# Patient Record
Sex: Female | Born: 1945 | Race: White | Hispanic: No | Marital: Married | Smoking: Current every day smoker
Health system: Southern US, Community
[De-identification: ages and names within clinical notes are randomized; demographics above are authoritative.]

## PROBLEM LIST (undated history)

## (undated) DIAGNOSIS — L89609 Pressure ulcer of unspecified heel, unspecified stage: Secondary | ICD-10-CM

## (undated) DIAGNOSIS — E039 Hypothyroidism, unspecified: Secondary | ICD-10-CM

## (undated) DIAGNOSIS — K219 Gastro-esophageal reflux disease without esophagitis: Secondary | ICD-10-CM

## (undated) DIAGNOSIS — I10 Essential (primary) hypertension: Secondary | ICD-10-CM

## (undated) DIAGNOSIS — G629 Polyneuropathy, unspecified: Secondary | ICD-10-CM

## (undated) DIAGNOSIS — C50919 Malignant neoplasm of unspecified site of unspecified female breast: Secondary | ICD-10-CM

## (undated) DIAGNOSIS — E119 Type 2 diabetes mellitus without complications: Secondary | ICD-10-CM

## (undated) DIAGNOSIS — L89009 Pressure ulcer of unspecified elbow, unspecified stage: Secondary | ICD-10-CM

## (undated) HISTORY — PX: BREAST LUMPECTOMY: SHX2

## (undated) HISTORY — PX: CHOLECYSTECTOMY: SHX55

---

## 2006-02-09 ENCOUNTER — Ambulatory Visit: Payer: Self-pay | Admitting: Unknown Physician Specialty

## 2007-05-03 ENCOUNTER — Ambulatory Visit: Payer: Self-pay | Admitting: Unknown Physician Specialty

## 2007-11-15 ENCOUNTER — Other Ambulatory Visit: Payer: Self-pay

## 2007-11-15 ENCOUNTER — Inpatient Hospital Stay: Payer: Self-pay | Admitting: Internal Medicine

## 2008-05-07 ENCOUNTER — Emergency Department: Payer: Self-pay | Admitting: Emergency Medicine

## 2008-05-22 ENCOUNTER — Emergency Department: Payer: Self-pay | Admitting: Emergency Medicine

## 2008-08-05 ENCOUNTER — Ambulatory Visit: Payer: Self-pay | Admitting: Unknown Physician Specialty

## 2009-06-26 ENCOUNTER — Ambulatory Visit: Payer: Self-pay | Admitting: Unknown Physician Specialty

## 2009-07-24 ENCOUNTER — Ambulatory Visit: Payer: Self-pay | Admitting: Surgery

## 2009-07-25 ENCOUNTER — Ambulatory Visit: Payer: Self-pay | Admitting: Surgery

## 2009-08-13 ENCOUNTER — Ambulatory Visit: Payer: Self-pay | Admitting: Internal Medicine

## 2009-09-08 ENCOUNTER — Ambulatory Visit: Payer: Self-pay | Admitting: Surgery

## 2009-09-13 ENCOUNTER — Ambulatory Visit: Payer: Self-pay | Admitting: Internal Medicine

## 2009-10-14 ENCOUNTER — Ambulatory Visit: Payer: Self-pay | Admitting: Internal Medicine

## 2009-11-11 ENCOUNTER — Ambulatory Visit: Payer: Self-pay | Admitting: Internal Medicine

## 2009-12-12 ENCOUNTER — Ambulatory Visit: Payer: Self-pay | Admitting: Internal Medicine

## 2010-01-11 ENCOUNTER — Ambulatory Visit: Payer: Self-pay | Admitting: Internal Medicine

## 2010-02-11 ENCOUNTER — Ambulatory Visit: Payer: Self-pay | Admitting: Internal Medicine

## 2010-03-13 ENCOUNTER — Ambulatory Visit: Payer: Self-pay | Admitting: Internal Medicine

## 2010-04-02 ENCOUNTER — Ambulatory Visit: Payer: Self-pay | Admitting: Surgery

## 2010-04-13 ENCOUNTER — Ambulatory Visit: Payer: Self-pay | Admitting: Internal Medicine

## 2010-07-14 ENCOUNTER — Ambulatory Visit: Payer: Self-pay | Admitting: Internal Medicine

## 2010-07-15 LAB — CANCER ANTIGEN 27.29: CA 27.29: 23.7 U/mL (ref 0.0–38.6)

## 2010-08-13 ENCOUNTER — Ambulatory Visit: Payer: Self-pay | Admitting: Internal Medicine

## 2010-09-08 ENCOUNTER — Ambulatory Visit: Payer: Self-pay | Admitting: Surgery

## 2010-11-12 ENCOUNTER — Ambulatory Visit: Payer: Self-pay | Admitting: Internal Medicine

## 2010-12-09 ENCOUNTER — Ambulatory Visit: Payer: Self-pay | Admitting: Unknown Physician Specialty

## 2010-12-13 ENCOUNTER — Ambulatory Visit: Payer: Self-pay | Admitting: Internal Medicine

## 2010-12-13 ENCOUNTER — Ambulatory Visit: Payer: Self-pay | Admitting: Unknown Physician Specialty

## 2011-01-12 ENCOUNTER — Ambulatory Visit: Payer: Self-pay | Admitting: Unknown Physician Specialty

## 2011-02-12 ENCOUNTER — Ambulatory Visit: Payer: Self-pay | Admitting: Unknown Physician Specialty

## 2011-03-22 ENCOUNTER — Ambulatory Visit: Payer: Self-pay | Admitting: Unknown Physician Specialty

## 2011-06-15 ENCOUNTER — Ambulatory Visit: Payer: Self-pay | Admitting: Internal Medicine

## 2011-07-15 ENCOUNTER — Ambulatory Visit: Payer: Self-pay | Admitting: Internal Medicine

## 2011-10-04 ENCOUNTER — Ambulatory Visit: Payer: Self-pay | Admitting: Surgery

## 2011-12-16 ENCOUNTER — Ambulatory Visit: Payer: Self-pay | Admitting: Oncology

## 2011-12-16 LAB — CBC CANCER CENTER
Basophil #: 0 x10 3/mm (ref 0.0–0.1)
Basophil %: 0.2 %
Eosinophil %: 1.4 %
HCT: 38.6 % (ref 35.0–47.0)
MCHC: 34.3 g/dL (ref 32.0–36.0)
MCV: 102 fL — ABNORMAL HIGH (ref 80–100)
Monocyte #: 0.6 x10 3/mm (ref 0.0–0.7)
Platelet: 232 x10 3/mm (ref 150–440)
RDW: 14 % (ref 11.5–14.5)
WBC: 8.3 x10 3/mm (ref 3.6–11.0)

## 2011-12-16 LAB — COMPREHENSIVE METABOLIC PANEL
Albumin: 3.9 g/dL (ref 3.4–5.0)
Alkaline Phosphatase: 102 U/L (ref 50–136)
BUN: 9 mg/dL (ref 7–18)
Calcium, Total: 9.2 mg/dL (ref 8.5–10.1)
Chloride: 102 mmol/L (ref 98–107)
Co2: 26 mmol/L (ref 21–32)
EGFR (African American): >60
EGFR (Non-African Amer.): >60
Glucose: 125 mg/dL — ABNORMAL HIGH (ref 65–99)
Osmolality: 278 (ref 275–301)
Potassium: 3.8 mmol/L (ref 3.5–5.1)
SGPT (ALT): 32 U/L
Sodium: 139 mmol/L (ref 136–145)

## 2012-01-12 ENCOUNTER — Ambulatory Visit: Payer: Self-pay | Admitting: Oncology

## 2012-06-15 ENCOUNTER — Ambulatory Visit: Payer: Self-pay | Admitting: Oncology

## 2012-06-16 LAB — CANCER ANTIGEN 27.29: CA 27.29: 18.3 U/mL (ref 0.0–38.6)

## 2012-07-14 ENCOUNTER — Ambulatory Visit: Payer: Self-pay | Admitting: Oncology

## 2012-11-09 ENCOUNTER — Ambulatory Visit: Payer: Self-pay | Admitting: Surgery

## 2012-12-14 ENCOUNTER — Ambulatory Visit: Payer: Self-pay | Admitting: Oncology

## 2012-12-15 LAB — CANCER ANTIGEN 27.29: CA 27.29: 21 U/mL (ref 0.0–38.6)

## 2013-01-11 ENCOUNTER — Ambulatory Visit: Payer: Self-pay | Admitting: Oncology

## 2013-05-09 ENCOUNTER — Ambulatory Visit: Payer: Self-pay | Admitting: Oncology

## 2013-05-24 ENCOUNTER — Ambulatory Visit: Payer: Self-pay | Admitting: Oncology

## 2013-05-25 LAB — CANCER ANTIGEN 27.29: CA 27.29: 28.8 U/mL (ref 0.0–38.6)

## 2013-06-13 ENCOUNTER — Ambulatory Visit: Payer: Self-pay | Admitting: Oncology

## 2013-11-19 ENCOUNTER — Ambulatory Visit: Payer: Self-pay | Admitting: Surgery

## 2013-11-21 ENCOUNTER — Ambulatory Visit: Payer: Self-pay | Admitting: Oncology

## 2013-11-23 LAB — CANCER ANTIGEN 27.29: CA 27.29: 21.6 U/mL (ref 0.0–38.6)

## 2013-12-12 ENCOUNTER — Ambulatory Visit: Payer: Self-pay | Admitting: Oncology

## 2014-11-27 ENCOUNTER — Ambulatory Visit: Payer: Self-pay | Admitting: Ophthalmology

## 2014-12-05 ENCOUNTER — Ambulatory Visit: Payer: Self-pay | Admitting: Unknown Physician Specialty

## 2014-12-20 ENCOUNTER — Ambulatory Visit
Admit: 2014-12-20 | Disposition: A | Discharge status: Home / Self Care | Payer: Self-pay | Attending: Surgery | Admitting: Surgery

## 2014-12-25 ENCOUNTER — Ambulatory Visit
Admit: 2014-12-25 | Disposition: A | Discharge status: Home / Self Care | Payer: Self-pay | Attending: Oncology | Admitting: Oncology

## 2015-01-15 ENCOUNTER — Other Ambulatory Visit: Payer: Self-pay | Admitting: Oncology

## 2015-01-15 DIAGNOSIS — C50911 Malignant neoplasm of unspecified site of right female breast: Secondary | ICD-10-CM

## 2015-01-21 ENCOUNTER — Emergency Department
Admission: EM | Admit: 2015-01-21 | Discharge: 2015-01-21 | Disposition: A | Discharge status: Home / Self Care | Payer: Medicare Other | Attending: Internal Medicine | Admitting: Internal Medicine

## 2015-01-21 ENCOUNTER — Other Ambulatory Visit: Payer: Self-pay

## 2015-01-21 ENCOUNTER — Emergency Department: Payer: Medicare Other

## 2015-01-21 ENCOUNTER — Encounter: Payer: Self-pay | Admitting: Medical Oncology

## 2015-01-21 DIAGNOSIS — W1809XA Striking against other object with subsequent fall, initial encounter: Secondary | ICD-10-CM

## 2015-01-21 DIAGNOSIS — Y998 Other external cause status: Secondary | ICD-10-CM | POA: Insufficient documentation

## 2015-01-21 DIAGNOSIS — E119 Type 2 diabetes mellitus without complications: Secondary | ICD-10-CM | POA: Insufficient documentation

## 2015-01-21 DIAGNOSIS — Y9389 Activity, other specified: Secondary | ICD-10-CM | POA: Insufficient documentation

## 2015-01-21 DIAGNOSIS — Y9289 Other specified places as the place of occurrence of the external cause: Secondary | ICD-10-CM | POA: Insufficient documentation

## 2015-01-21 DIAGNOSIS — S42201A Unspecified fracture of upper end of right humerus, initial encounter for closed fracture: Secondary | ICD-10-CM | POA: Insufficient documentation

## 2015-01-21 DIAGNOSIS — W01198A Fall on same level from slipping, tripping and stumbling with subsequent striking against other object, initial encounter: Secondary | ICD-10-CM | POA: Insufficient documentation

## 2015-01-21 DIAGNOSIS — I10 Essential (primary) hypertension: Secondary | ICD-10-CM | POA: Diagnosis not present

## 2015-01-21 DIAGNOSIS — S4991XA Unspecified injury of right shoulder and upper arm, initial encounter: Secondary | ICD-10-CM | POA: Diagnosis present

## 2015-01-21 DIAGNOSIS — Z720 Tobacco use: Secondary | ICD-10-CM | POA: Diagnosis not present

## 2015-01-21 DIAGNOSIS — S4291XA Fracture of right shoulder girdle, part unspecified, initial encounter for closed fracture: Secondary | ICD-10-CM

## 2015-01-21 HISTORY — DX: Type 2 diabetes mellitus without complications: E11.9

## 2015-01-21 HISTORY — DX: Malignant neoplasm of unspecified site of unspecified female breast: C50.919

## 2015-01-21 HISTORY — DX: Essential (primary) hypertension: I10

## 2015-01-21 LAB — CBC
HCT: 42.2 % (ref 35.0–47.0)
Hemoglobin: 14.5 g/dL (ref 12.0–16.0)
MCH: 33.9 pg (ref 26.0–34.0)
MCHC: 34.3 g/dL (ref 32.0–36.0)
MCV: 98.8 fL (ref 80.0–100.0)
PLATELETS: 237 10*3/uL (ref 150–440)
RBC: 4.27 MIL/uL (ref 3.80–5.20)
RDW: 13.1 % (ref 11.5–14.5)
WBC: 10.1 10*3/uL (ref 3.6–11.0)

## 2015-01-21 LAB — BASIC METABOLIC PANEL
ANION GAP: 11 (ref 5–15)
BUN: 10 mg/dL (ref 6–20)
CALCIUM: 9.6 mg/dL (ref 8.9–10.3)
CO2: 23 mmol/L (ref 22–32)
Chloride: 97 mmol/L — ABNORMAL LOW (ref 101–111)
Creatinine, Ser: 0.55 mg/dL (ref 0.44–1.00)
GFR calc Af Amer: >60 mL/min (ref >60)
GFR calc non Af Amer: >60 mL/min (ref >60)
Glucose, Bld: 125 mg/dL — ABNORMAL HIGH (ref 65–99)
Potassium: 3.9 mmol/L (ref 3.5–5.1)
Sodium: 131 mmol/L — ABNORMAL LOW (ref 135–145)

## 2015-01-21 MED ORDER — OXYCODONE-ACETAMINOPHEN 5-325 MG PO TABS: 1.0000 | ORAL_TABLET | Freq: Four times a day (QID) | ORAL | Status: DC | PRN

## 2015-01-21 MED ORDER — OXYCODONE-ACETAMINOPHEN 5-325 MG PO TABS
ORAL_TABLET | ORAL | Status: AC
  Filled 2015-01-21: qty 1

## 2015-01-21 MED ORDER — OXYCODONE-ACETAMINOPHEN 5-325 MG PO TABS
1.0000 | ORAL_TABLET | Freq: Once | ORAL | Status: AC
  Administered 2015-01-21: 1 via ORAL

## 2015-01-21 NOTE — Discharge Instructions (Signed)
Take pain meds as directed. Call dr Roland Rack in the am tomorrow for continuation of care. He will determine if you will require surgical repair for your shoulder. Ice to the injured area. Immobilize as directed do not remove the immobilizer. He may feel more comfortable sleeping with the head of your bed elevated with a small pillow under your right shoulder. Either take your usual pain medication or the pain medication prescribed in the emergency department but not both. Do not take additional acetaminophen or Tylenol products for new or taking pain medication. Return to the ER if her symptoms worsen or if she develop any new symptoms.

## 2015-01-21 NOTE — ED Notes (Signed)
Pt with obvious right shoulder injury.  Shoulder swollen and bruised.  Pt advises no pain unless she moves same.

## 2015-01-21 NOTE — ED Notes (Signed)
Ice applied to right shoulder

## 2015-01-21 NOTE — ED Provider Notes (Signed)
[facility] Emergency Department Provider Note  ____________________________________________  Time seen: Approximately 5:09 PM  I have reviewed the triage vital signs and the nursing notes.   HISTORY  Chief Complaint Fall    HPI [name] is a 69 y.o. female who presents to the emergency department with her husband with a chief complaint of right shoulder pain.  Patient was not using her walker last evening and she fell going to the bathroom. She hit her right shoulder on the floor. She got back into the bedroom and again had difficulty getting into the bed and slipped onto the floor in the bedroom.  Last evening after the fall she noted the onset of pain and swelling in her right shoulder. Pain is been constant since that time. Pain is rated as 8 out of 10 in severity pain. Pain is sharp and throbbing worse with motion. She did take her prescription pain medication which he takes for other orthopedic problems which did offer some relief in the evening last night but today the pain medication was not helpful. She stayed in bed all day until her husband convinced her that she had a go to the emergency department this evening.  She also complains of decreased range of motion of the right shoulder. She denies any loss of consciousness any headache any neck pain any pain other than the right shoulder pain she denies pelvic pain she is able to ambulate since the fall she has no other extremity pain.  She does have chronic back pain from a compression fracture that occurred this summer secondary to a fall she does see Dr. Sherrie Sport for this and has an appointment to see Dr. Sharalyn Ink neurosurgeon next week for this.  Patient does admit to frequent falls. She is supposed to use a walker at all times but often does not use it when she is walking around the house.  Past Medical History  Diagnosis Date  . Diabetes mellitus without complication   .  Hypertension   . Breast cancer     There are no active problems to display for this patient.   Past Surgical History  Procedure Laterality Date  . Cholecystectomy      No current outpatient prescriptions on file.  Allergies Codeine; Erythromycin; and Levaquin  No family history on file.  Social History History  Substance Use Topics  . Smoking status: Current Every Day Smoker -- 1.00 packs/day  . Smokeless tobacco: Not on file  . Alcohol Use: Yes     Comment: daily    Review of Systems Constitutional: No fever/chills Eyes: No visual changes. ENT: No sore throat. Cardiovascular: Denies chest pain. Respiratory: Denies shortness of breath. Gastrointestinal: No abdominal pain.  No nausea, no vomiting.  No diarrhea.  No constipation. Genitourinary: Negative for dysuria. Musculoskeletal: Positive for chronic back pain. As noted for right shoulder pain and swelling decreased range of motion since her fall last night. Skin: Negative for rash. Neurological: Negative for headaches, focal weakness or numbness.  10-point ROS otherwise negative.  ____________________________________________   PHYSICAL EXAM:  VITAL SIGNS: ED Triage Vitals  Enc Vitals Group     BP 01/21/15 1456 122/64 mmHg     Pulse Rate 01/21/15 1456 78     Resp 01/21/15 1456 17     Temp 01/21/15 1456 98.6 F (37 C)     Temp Source 01/21/15 1456 Oral     SpO2 01/21/15 1456 99 %     Weight 01/21/15  1456 119 lb (53.978 kg)     Height 01/21/15 1456 5\' 5"  (1.651 m)     Head Cir --      Peak Flow --      Pain Score 01/21/15 1457 8     Pain Loc --      Pain Edu? --      Excl. in [place]? --   Initial vital signs that were obtained at triage are reviewed and are above they are within normal limits.   The patient appears to be in mild to moderate pain. She is holding her right arm close to her body and is having pain when she tries to move her right shoulder.  Constitutional: Alert and oriented. Well  appearing and in obvious pain. Eyes: Conjunctivae are normal. PERRL. EOMI. Head: Atraumatic. No headache Nose: No congestion/rhinnorhea. Mouth/Throat: Mucous membranes are moist.  Oropharynx non-erythematous. Neck: No stridor. No neck pain  Cardiovascular: Normal rate, regular rhythm. Grossly normal heart sounds.  Good peripheral circulation. Respiratory: Normal respiratory effort.  No retractions. Lungs CTAB. Gastrointestinal: Soft and nontender. No distention. No abdominal bruits. No CVA tenderness. Musculoskeletal: No lower extremity tenderness nor edema.  No joint effusions. Pain and deformity of the right shoulder. Unable to elevate right shoulder. Neurologic:  Normal speech and language. No gross focal neurologic deficits are appreciated. Speech is normal. No gait instability. Skin:  Skin is warm, dry and intact. No rash noted. Psychiatric: Mood and affect are normal. Speech and behavior are normal.  ____________________________________________   LABS (all labs ordered are listed, but only abnormal results are displayed)  Labs Reviewed  BASIC METABOLIC PANEL - Abnormal; Notable for the following:    Sodium 131 (*)    Chloride 97 (*)    Glucose, Bld 125 (*)    All other components within normal limits  CBC   _ Labs Reviewed  BASIC METABOLIC PANEL - Abnormal; Notable for the following:    Sodium 131 (*)    Chloride 97 (*)    Glucose, Bld 125 (*)    All other components within normal limits  CBC  __ labs including a minute B profile and a CBC were obtained in the emergency department they are normal other than a sodium of 131 chloride of 97 which are both low and a glucose of 125 which is high. _________________________________________  EKG  ED ECG REPORT   Date: 01/21/2015  EKG Time: 15:06  Rate: 79  Rhythm: normal EKG, normal sinus rhythm, unchanged from previous tracings, normal sinus rhythm  Axis: Normal  Intervals:none  ST&T Change  none  ____________________________________________  RADIOLOGY  FINDINGS: Comminuted proximal RIGHT humerus fracture is present. There is internal rotation of the humeral head and pseudosubluxation of the head. Oblique proximal metaphysis fracture is present. There also appears to be posterior rotation of the articular surface of the head. This is at least a 2 part fracture. No displaced greater tuberosity fragment. On the axillary view, there appears to be a nondisplaced lesser tuberosity fragment.  IMPRESSION: Proximal RIGHT humerus fracture, at least a Neer 2 part.   Electronically Signed By: Dereck Ligas M.D. On: 01/21/2015 15:42        ECG Results     ____________________________________________   PROCEDURES  Procedure(s) performed: None  Critical Care performed: No  ____________________________________________   INITIAL IMPRESSION / ASSESSMENT AND PLAN / ED COURSE  Pertinent labs & imaging results that were available during my care of the patient were reviewed by me and connsidered in  my medical decision making (see chart for details).  This 69 year old female presented to the ED with a chief complaint of pain and immobility of the right shoulder since last night when she had a non-syncopal fall in the home. She denies any injuries other than that to the right shoulder.  In the emergency department x-rays revealed a complicated proximal right humerus fracture. She was given by mouth analgesic in the emergency department and a consult was called to Dr. Rudene Christians..After Dr. Rudene Christians viewed the x-rays he recommended ice, immobilization, analgesics, and follow-up with Dr. Roland Rack outpatient this week.   The patient and her husband were informed of the plan they're to call Dr. Roland Rack tomorrow and arrange for follow-up which may include possible surgery to be determined by Dr. Roland Rack ____________________________________________   FINAL CLINICAL IMPRESSION(S) / ED  DIAGNOSES  1. Humerous fracture Right acute initial encounter 2. Fall acute encounter, initial encounter    Boris Lown, DO 01/21/15 1758

## 2015-01-21 NOTE — ED Notes (Addendum)
Pt reports falling last night, unsure what caused her to fall. Reports feeling weak. Denies hitting head or LOC. States that when she fell she injured her rt shoulder.

## 2015-01-27 ENCOUNTER — Other Ambulatory Visit: Payer: Self-pay | Admitting: Physician Assistant

## 2015-01-27 DIAGNOSIS — S42221A 2-part displaced fracture of surgical neck of right humerus, initial encounter for closed fracture: Secondary | ICD-10-CM

## 2015-02-03 ENCOUNTER — Ambulatory Visit
Admission: RE | Admit: 2015-02-03 | Discharge: 2015-02-03 | Disposition: A | Discharge status: Home / Self Care | Payer: Medicare Other | Source: Ambulatory Visit | Attending: Physician Assistant | Admitting: Physician Assistant

## 2015-02-03 DIAGNOSIS — S42211A Unspecified displaced fracture of surgical neck of right humerus, initial encounter for closed fracture: Secondary | ICD-10-CM | POA: Insufficient documentation

## 2015-02-03 DIAGNOSIS — S42221A 2-part displaced fracture of surgical neck of right humerus, initial encounter for closed fracture: Secondary | ICD-10-CM

## 2015-02-03 DIAGNOSIS — X58XXXA Exposure to other specified factors, initial encounter: Secondary | ICD-10-CM | POA: Insufficient documentation

## 2015-03-13 ENCOUNTER — Encounter: Payer: Medicare Other | Attending: Surgery | Admitting: Surgery

## 2015-03-13 DIAGNOSIS — J45909 Unspecified asthma, uncomplicated: Secondary | ICD-10-CM | POA: Insufficient documentation

## 2015-03-13 DIAGNOSIS — E119 Type 2 diabetes mellitus without complications: Secondary | ICD-10-CM | POA: Diagnosis not present

## 2015-03-13 DIAGNOSIS — Z853 Personal history of malignant neoplasm of breast: Secondary | ICD-10-CM | POA: Diagnosis not present

## 2015-03-13 DIAGNOSIS — L89613 Pressure ulcer of right heel, stage 3: Secondary | ICD-10-CM | POA: Insufficient documentation

## 2015-03-13 DIAGNOSIS — I10 Essential (primary) hypertension: Secondary | ICD-10-CM | POA: Diagnosis not present

## 2015-03-13 DIAGNOSIS — L89013 Pressure ulcer of right elbow, stage 3: Secondary | ICD-10-CM | POA: Diagnosis not present

## 2015-03-13 DIAGNOSIS — F419 Anxiety disorder, unspecified: Secondary | ICD-10-CM | POA: Diagnosis not present

## 2015-03-13 DIAGNOSIS — F17218 Nicotine dependence, cigarettes, with other nicotine-induced disorders: Secondary | ICD-10-CM | POA: Insufficient documentation

## 2015-03-14 NOTE — Progress Notes (Signed)
[name] ([id number]) Visit Report for 03/13/2015 Allergy List Details Patient Name: [name] Date of Service: 03/13/2015 1:00 PM Medical Record Number: [id number] Patient Account Number: [id number] Date of Birth/Sex: 1945-10-26 (69 y.o. Female) Treating RN: Baruch Gouty, RN, BSN, Velva Harman Primary Care Physician: Paulita Cradle Other Clinician: Referring Physician: Mariana Arn Treating Physician/Extender: Frann Rider in Treatment: 0 Allergies Active Allergies erythromycin codeine Reaction: hyper levaquin Reaction: hallucinations Allergy Notes Electronic Signature(s) Signed: 03/13/2015 4:58:02 PM By: Regan Lemming BSN, RN Entered By: Regan Lemming on 03/13/2015 13:44:02 [place], [place]. ([id number]) -------------------------------------------------------------------------------- Arrival Information Details Patient Name: [name] Date of Service: 03/13/2015 1:00 PM Medical Record Number: [id number] Patient Account Number: [id number] Date of Birth/Sex: [birth year] (69 y.o. Female) Treating RN: Baruch Gouty, RN, BSN, Velva Harman Primary Care Physician: Paulita Cradle Other Clinician: Referring Physician: Mariana Arn Treating Physician/Extender: Frann Rider in Treatment: 0 Visit Information Patient Arrived: Wheel Chair Arrival Time: 13:14 Accompanied By: husband Transfer Assistance: Manual Patient Identification Verified: Yes Secondary Verification Process Yes Completed: Patient Requires Transmission-Based No Precautions: Patient Has Alerts: No Electronic Signature(s) Signed: 03/13/2015 4:58:02 PM By: Regan Lemming BSN, RN Entered By: Regan Lemming on 03/13/2015 13:28:16 [place], [place]. ([id number]) -------------------------------------------------------------------------------- Clinic Level of Care Assessment Details Patient Name: [name] Date of Service: 03/13/2015 1:00 PM Medical Record Number: [id number] Patient Account Number: [id number] Date of  Birth/Sex: [birth year] (69 y.o. Female) Treating RN: Afful, RN, BSN, Velva Harman Primary Care Physician: Paulita Cradle Other Clinician: Referring Physician: Mariana Arn Treating Physician/Extender: Frann Rider in Treatment: 0 Clinic Level of Care Assessment Items TOOL 1 Quantity Score []  - Use when EandM and Procedure is performed on INITIAL visit 0 ASSESSMENTS - Nursing Assessment / Reassessment X - General Physical Exam (combine w/ comprehensive assessment (listed just 1 20 below) when performed on new pt. evals) X - Comprehensive Assessment (HX, ROS, Risk Assessments, Wounds Hx, etc.) 1 25 ASSESSMENTS - Wound and Skin Assessment / Reassessment []  - Dermatologic / Skin Assessment (not related to wound area) 0 ASSESSMENTS - Ostomy and/or Continence Assessment and Care []  - Incontinence Assessment and Management 0 []  - Ostomy Care Assessment and Management (repouching, etc.) 0 PROCESS - Coordination of Care X - Simple Patient / Family Education for ongoing care 1 15 []  - Complex (extensive) Patient / Family Education for ongoing care 0 X - Staff obtains Programmer, systems, Records, Test Results / Process Orders 1 10 []  - Staff telephones HHA, Nursing Homes / Clarify orders / etc 0 []  - Routine Transfer to another Facility (non-emergent condition) 0 []  - Routine Hospital Admission (non-emergent condition) 0 []  - New Admissions / Biomedical engineer / Ordering NPWT, Apligraf, etc. 0 []  - Emergency Hospital Admission (emergent condition) 0 PROCESS - Special Needs []  - Pediatric / Minor Patient Management 0 []  - Isolation Patient Management 0 [name] ([id number]) []  - Hearing / Language / Visual special needs 0 []  - Assessment of Community assistance (transportation, D/C planning, etc.) 0 []  - Additional assistance / Altered mentation 0 []  - Support Surface(s) Assessment (bed, cushion, seat, etc.) 0 INTERVENTIONS - Miscellaneous []  - External ear exam 0 []  - Patient Transfer  (multiple staff / Civil Service fast streamer / Similar devices) 0 []  - Simple Staple / Suture removal (25 or less) 0 []  - Complex Staple / Suture removal (26 or more) 0 []  - Hypo/Hyperglycemic Management (do not check if billed separately) 0 X - Ankle / Brachial Index (ABI) - do not check if billed separately 1 15 Has  the patient been seen at the hospital within the last three years: Yes Total Score: 85 Level Of Care: New/Established - Level 3 Electronic Signature(s) Signed: 03/13/2015 4:58:02 PM By: Regan Lemming BSN, RN Entered By: Regan Lemming on 03/13/2015 14:14:42 [name] ([id number]) -------------------------------------------------------------------------------- Encounter Discharge Information Details Patient Name: [name] Date of Service: 03/13/2015 1:00 PM Medical Record Number: [id number] Patient Account Number: [id number] Date of Birth/Sex: Aug 21, 1946 (69 y.o. Female) Treating RN: Baruch Gouty, RN, BSN, Velva Harman Primary Care Physician: Paulita Cradle Other Clinician: Referring Physician: Mariana Arn Treating Physician/Extender: Frann Rider in Treatment: 0 Encounter Discharge Information Items Discharge Pain Level: 0 Discharge Condition: Stable Ambulatory Status: Wheelchair Discharge Destination: Home Private Transportation: Auto Accompanied By: husband Schedule Follow-up Appointment: No Medication Reconciliation completed and No provided to Patient/Care [place]: Clinical Summary of Care: Electronic Signature(s) Signed: 03/13/2015 4:58:02 PM By: Regan Lemming BSN, RN Entered By: Regan Lemming on 03/13/2015 14:16:34 [place], [place]. ([id number]) -------------------------------------------------------------------------------- Lower Extremity Assessment Details Patient Name: [name] Date of Service: 03/13/2015 1:00 PM Medical Record Number: [id number] Patient Account Number: [id number] Date of Birth/Sex: [birth year] (69 y.o. Female) Treating RN: Afful, RN, BSN,  [place] Primary Care Physician: Paulita Cradle Other Clinician: Referring Physician: Mariana Arn Treating Physician/Extender: Frann Rider in Treatment: 0 Edema Assessment Assessed: [Left: No] [Right: No] E[Left: dema] [Right: :] Calf Left: Right: Point of Measurement: 32 cm From Medial Instep 28.5 cm 28.5 cm Ankle Left: Right: Point of Measurement: 8 cm From Medial Instep 18 cm 18 cm Vascular Assessment Claudication: Claudication Assessment [Left:None] [Right:None] Pulses: Posterior Tibial Palpable: [Left:Yes] [Right:Yes] Doppler: [Left:Multiphasic] [Right:Multiphasic] Dorsalis Pedis Palpable: [Left:Yes] [Right:Yes] Doppler: [Left:Multiphasic] [Right:Multiphasic] Extremity colors, hair growth, and conditions: Extremity Color: [Left:Mottled] [Right:Mottled] Hair Growth on Extremity: [Left:No] [Right:No] Temperature of Extremity: [Left:Warm] [Right:Warm] Capillary Refill: [Left:< 3 seconds] [Right:< 3 seconds] Dependent Rubor: [Left:No] [Right:No] Blanched when Elevated: [Left:No] [Right:No] Lipodermatosclerosis: [Left:No] [Right:No] Blood Pressure: Brachial: [Left:110] [Right:96] Dorsalis Pedis: 110 [Left:Dorsalis Pedis: 80] Ankle: Posterior Tibial: 80 [Left:Posterior Tibial: 118 1.00] [Right:1.07] Toe Nail Assessment Hamza, Wilmoth [name]. ([id number]) Left: Right: Thick: No No Discolored: No No Deformed: No No Improper Length and Hygiene: No Electronic Signature(s) Signed: 03/13/2015 4:58:02 PM By: Regan Lemming BSN, RN Entered By: Regan Lemming on 03/13/2015 13:47:49 Duque, Andjela [name]. ([id number]) -------------------------------------------------------------------------------- Multi Wound Chart Details Patient Name: [name] Date of Service: 03/13/2015 1:00 PM Medical Record Number: [id number] Patient Account Number: [id number] Date of Birth/Sex: [birth year] (69 y.o. Female) Treating RN: Baruch Gouty, RN, BSN, Velva Harman Primary Care Physician: Paulita Cradle Other  Clinician: Referring Physician: Mariana Arn Treating Physician/Extender: Frann Rider in Treatment: 0 Vital Signs Height(in): 65 Pulse(bpm): 72 Weight(lbs): 118 Blood Pressure 110/80 (mmHg): Body Mass Index(BMI): 20 Temperature(F): 98.6 Respiratory Rate 16 (breaths/min): Photos: [1:No Photos] [2:No Photos] [N/A:N/A] Wound Location: [1:Right Calcaneous] [2:Right Elbow] [N/A:N/A] Wounding Event: [1:Gradually Appeared] [2:Gradually Appeared] [N/A:N/A] Primary Etiology: [1:Pressure Ulcer] [2:Pressure Ulcer] [N/A:N/A] Comorbid History: [1:Cataracts, Asthma, Hypertension, Type II Diabetes, Neuropathy, Received Chemotherapy] [2:Cataracts, Asthma, Hypertension, Type II Diabetes, Neuropathy, Received Chemotherapy] [N/A:N/A] Date Acquired: [1:02/25/2015] [2:02/25/2015] [N/A:N/A] Weeks of Treatment: [1:0] [2:0] [N/A:N/A] Wound Status: [1:Open] [2:Open] [N/A:N/A] Measurements L x W x D 2.5x3x0.2 [2:2.4x1x0.2] [N/A:N/A] (cm) Area (cm) : [1:5.89] [2:1.885] [N/A:N/A] Volume (cm) : [1:1.178] [2:0.377] [N/A:N/A] % Reduction in Area: [1:0.00%] [2:0.00%] [N/A:N/A] % Reduction in Volume: 0.00% [2:0.00%] [N/A:N/A] Classification: [1:Category/Stage II] [2:Category/Stage II] [N/A:N/A] HBO Classification: [1:Grade 1] [2:N/A] [N/A:N/A] Exudate Amount: [1:Small] [2:Small] [N/A:N/A] Exudate Type: [1:Serosanguineous] [2:Serosanguineous] [N/A:N/A] Exudate Color: [1:red, brown] [2:red, brown] [N/A:N/A] Wound  Margin: [1:Distinct, outline attached] [2:Distinct, outline attached] [N/A:N/A] Granulation Amount: [1:Small (1-33%)] [2:Small (1-33%)] [N/A:N/A] Granulation Quality: [1:Pink, Pale] [2:Pink, Pale] [N/A:N/A] Necrotic Amount: [1:Medium (34-66%)] [2:Medium (34-66%)] [N/A:N/A] Exposed Structures: [1:Fascia: No Fat: No Tendon: No Muscle: No] [2:Fascia: No Fat: No Tendon: No Muscle: No] [N/A:N/A] Joint: No Joint: No Bone: No Bone: No Limited to Skin Limited to Skin Breakdown  Breakdown Epithelialization: Small (1-33%) None N/A Periwound Skin Texture: Edema: No Edema: No N/A Excoriation: No Excoriation: No Induration: No Induration: No Callus: No Callus: No Crepitus: No Crepitus: No Fluctuance: No Fluctuance: No Friable: No Friable: No Rash: No Rash: No Scarring: No Scarring: No Periwound Skin Maceration: No Moist: Yes N/A Moisture: Moist: No Maceration: No Dry/Scaly: No Dry/Scaly: No Periwound Skin Color: Atrophie Blanche: No Atrophie Blanche: No N/A Cyanosis: No Cyanosis: No Ecchymosis: No Ecchymosis: No Erythema: No Erythema: No Hemosiderin Staining: No Hemosiderin Staining: No Mottled: No Mottled: No Pallor: No Pallor: No Rubor: No Rubor: No Temperature: No Abnormality No Abnormality N/A Tenderness on Yes Yes N/A Palpation: Wound Preparation: Ulcer Cleansing: Ulcer Cleansing: N/A Rinsed/Irrigated with Rinsed/Irrigated with Saline Saline Topical Anesthetic Topical Anesthetic Applied: Other: lidocaine Applied: Other: lidocaine 4% 4% Treatment Notes Electronic Signature(s) Signed: 03/13/2015 4:58:02 PM By: Regan Lemming BSN, RN Entered By: Regan Lemming on 03/13/2015 14:00:42 [place], Enriqueta [name]. ([id number]) -------------------------------------------------------------------------------- Multi-Disciplinary Care Plan Details Patient Name: [name] Date of Service: 03/13/2015 1:00 PM Medical Record Number: [id number] Patient Account Number: [id number] Date of Birth/Sex: May 14, 1946 (69 y.o. Female) Treating RN: Afful, RN, BSN, [place] Primary Care Physician: Paulita Cradle Other Clinician: Referring Physician: Mariana Arn Treating Physician/Extender: Frann Rider in Treatment: 0 Active Inactive Abuse / Safety / Falls / Self Care Management Nursing Diagnoses: Impaired home maintenance Impaired physical mobility Knowledge deficit related to: safety; personal, health (wound), emergency Potential for falls Self care  deficit: actual or potential Goals: Patient will remain injury free Date Initiated: 03/13/2015 Goal Status: Active Patient/caregiver will verbalize understanding of skin care regimen Date Initiated: 03/13/2015 Goal Status: Active Patient/caregiver will verbalize/demonstrate measure taken to improve self care Date Initiated: 03/13/2015 Goal Status: Active Patient/caregiver will verbalize/demonstrate measures taken to improve the patient's personal safety Date Initiated: 03/13/2015 Goal Status: Active Patient/caregiver will verbalize/demonstrate measures taken to prevent injury and/or falls Date Initiated: 03/13/2015 Goal Status: Active Patient/caregiver will verbalize/demonstrate understanding of what to do in case of emergency Date Initiated: 03/13/2015 Goal Status: Active Interventions: Assess fall risk on admission and as needed Assess: immobility, friction, shearing, incontinence upon admission and as needed Assess impairment of mobility on admission and as needed per policy Assess self care needs on admission and as needed Provide education on basic hygiene [name] ([id number]) Provide education on fall prevention Provide education on personal and home safety Provide education on safe transfers Notes: Orientation to the Wound Care Program Nursing Diagnoses: Knowledge deficit related to the wound healing center program Goals: Patient/caregiver will verbalize understanding of the [place] Date Initiated: 03/13/2015 Goal Status: Active Interventions: Provide education on orientation to the wound center Notes: Pressure Nursing Diagnoses: Knowledge deficit related to causes and risk factors for pressure ulcer development Knowledge deficit related to management of pressures ulcers Potential for impaired tissue integrity related to pressure, friction, moisture, and shear Goals: Patient will remain free from development of additional pressure  ulcers Date Initiated: 03/13/2015 Goal Status: Active Patient will remain free of pressure ulcers Date Initiated: 03/13/2015 Goal Status: Active Patient/caregiver will verbalize risk factors for pressure ulcer development Date Initiated:  03/13/2015 Goal Status: Active Patient/caregiver will verbalize understanding of pressure ulcer management Date Initiated: 03/13/2015 Goal Status: Active Interventions: Assess: immobility, friction, shearing, incontinence upon admission and as needed Assess offloading mechanisms upon admission and as needed Assess potential for pressure ulcer upon admission and as needed [name] ([id number]) Provide education on pressure ulcers Treatment Activities: Patient referred for home evaluation of offloading devices/mattresses : 03/13/2015 Patient referred for pressure reduction/relief devices : 03/13/2015 Patient referred for seating evaluation to ensure proper offloading : 03/13/2015 Pressure reduction/relief device ordered : 03/13/2015 Test ordered outside of clinic : 03/13/2015 Notes: Wound/Skin Impairment Nursing Diagnoses: Impaired tissue integrity Knowledge deficit related to ulceration/compromised skin integrity Goals: Patient/caregiver will verbalize understanding of skin care regimen Date Initiated: 03/13/2015 Goal Status: Active Ulcer/skin breakdown will have a volume reduction of 30% by week 4 Date Initiated: 03/13/2015 Goal Status: Active Ulcer/skin breakdown will have a volume reduction of 50% by week 8 Date Initiated: 03/13/2015 Goal Status: Active Ulcer/skin breakdown will have a volume reduction of 80% by week 12 Date Initiated: 03/13/2015 Goal Status: Active Ulcer/skin breakdown will heal within 14 weeks Date Initiated: 03/13/2015 Goal Status: Active Interventions: Assess patient/caregiver ability to obtain necessary supplies Assess patient/caregiver ability to perform ulcer/skin care regimen upon admission and as needed Assess  ulceration(s) every visit Provide education on smoking Provide education on ulcer and skin care Treatment Activities: Patient referred to home care : 03/13/2015 Referred to DME [facility] Spader for dressing supplies : 03/13/2015 Skin care regimen initiated : 03/13/2015 [name] ([id number]) Topical wound management initiated : 03/13/2015 Notes: Electronic Signature(s) Signed: 03/13/2015 4:58:02 PM By: Regan Lemming BSN, RN Entered By: Regan Lemming on 03/13/2015 14:00:29 Paul, Oriah [name]. ([id number]) -------------------------------------------------------------------------------- Pain Assessment Details Patient Name: [name] Date of Service: 03/13/2015 1:00 PM Medical Record Number: [id number] Patient Account Number: [id number] Date of Birth/Sex: 1946/01/13 (69 y.o. Female) Treating RN: Baruch Gouty, RN, BSN, Velva Harman Primary Care Physician: Paulita Cradle Other Clinician: Referring Physician: Mariana Arn Treating Physician/Extender: Frann Rider in Treatment: 0 Active Problems Location of Pain Severity and Description of Pain Patient Has Paino No Site Locations Pain Management and Medication Current Pain Management: Electronic Signature(s) Signed: 03/13/2015 4:58:02 PM By: Regan Lemming BSN, RN Entered By: Regan Lemming on 03/13/2015 13:44:17 Lewter, Ismael [name]. ([id number]) -------------------------------------------------------------------------------- Patient/Caregiver Education Details Patient Name: [name], Julizza [name]. Date of Service: 03/13/2015 1:00 PM Medical Record Number: [id number] Patient Account Number: [id number] Date of Birth/Gender: June 22, 1946 (69 y.o. Female) Treating RN: Afful, RN, BSN, Velva Harman Primary Care Physician: Paulita Cradle Other Clinician: Referring Physician: Mariana Arn Treating Physician/Extender: Frann Rider in Treatment: 0 Education Assessment Education Provided To: Patient and Caregiver spouse Education Topics Provided Basic Hygiene: Methods:  Explain/Verbal Responses: State content correctly Pressure: Handouts: Pressure Ulcers: Care and Offloading, Preventing Pressure Ulcers Methods: Explain/Verbal Responses: State content correctly Safety: Handouts: Medication Safety, Personal Safety, Safe Transfers, Safety Methods: Explain/Verbal Responses: Return demonstration correctly Smoking and Wound Healing: Handouts: Smoking and Wound Healing Methods: Explain/Verbal Responses: State content correctly Welcome To [facility]: Handouts: Welcome To [facility] Methods: Explain/Verbal Responses: State content correctly Wound/Skin Impairment: Methods: Explain/Verbal Responses: State content correctly Electronic Signature(s) Signed: 03/13/2015 4:58:02 PM By: Regan Lemming BSN, RN Entered By: Regan Lemming on 03/13/2015 14:17:36 Szafran, Kedra [name]. ([id number]) Strnad, Lien [name]. ([id number]) -------------------------------------------------------------------------------- Wound Assessment Details Patient Name: Anstine, Shasta [name]. Date of Service: 03/13/2015 1:00 PM Medical Record Number: [id number] Patient Account Number: [id number] Date of Birth/Sex: Nov 25, 1945 (69 y.o. Female) Treating RN: Afful, RN,  BSN, Velva Harman Primary Care Physician: Paulita Cradle Other Clinician: Referring Physician: Mariana Arn Treating Physician/Extender: Frann Rider in Treatment: 0 Wound Status Wound Number: 1 Primary Pressure Ulcer Etiology: Wound Location: Right Calcaneous Wound Open Wounding Event: Gradually Appeared Status: Date Acquired: 02/25/2015 Comorbid Cataracts, Asthma, Hypertension, Type Weeks Of Treatment: 0 History: II Diabetes, Neuropathy, Received Clustered Wound: No Chemotherapy Photos Photo Uploaded By: Regan Lemming on 03/13/2015 16:56:30 Wound Measurements Length: (cm) 2.5 Width: (cm) 3 Depth: (cm) 0.2 Area: (cm) 5.89 Volume: (cm) 1.178 % Reduction in Area: 0% % Reduction in Volume:  0% Epithelialization: Small (1-33%) Undermining: No Wound Description Classification: Category/Stage III Diabetic Severity Earleen Newport): Grade 1 Wound Margin: Distinct, outline attach Exudate Amount: Small Exudate Type: Serosanguineous Exudate Color: red, brown Foul Odor After Cleansing: No ed Wound Bed Granulation Amount: Small (1-33%) Exposed Structure Granulation Quality: Pink, Pale Fascia Exposed: No Necrotic Amount: Medium (34-66%) Fat Layer Exposed: No Exline, Kamylle [name]. ([id number]) Necrotic Quality: Adherent Slough Tendon Exposed: No Muscle Exposed: No Joint Exposed: No Bone Exposed: No Limited to Skin Breakdown Periwound Skin Texture Texture Color No Abnormalities Noted: No No Abnormalities Noted: No Callus: No Atrophie Blanche: No Crepitus: No Cyanosis: No Excoriation: No Ecchymosis: No Fluctuance: No Erythema: No Friable: No Hemosiderin Staining: No Induration: No Mottled: No Localized Edema: No Pallor: No Rash: No Rubor: No Scarring: No Temperature / Pain Moisture Temperature: No Abnormality No Abnormalities Noted: No Tenderness on Palpation: Yes Dry / Scaly: No Maceration: No Moist: Yes Wound Preparation Ulcer Cleansing: Rinsed/Irrigated with Saline Topical Anesthetic Applied: Other: lidocaine 4%, Treatment Notes Wound #1 (Right Calcaneous) 1. Cleansed with: Clean wound with Normal Saline 2. Anesthetic Topical Lidocaine 4% cream to wound bed prior to debridement 3. Peri-wound Care: Skin Prep 4. Dressing Applied: Aquacel Ag 5. Secondary Dressing Applied Bordered Foam Dressing Electronic Signature(s) Signed: 03/13/2015 4:58:02 PM By: Regan Lemming BSN, RN Entered By: Regan Lemming on 03/13/2015 14:18:08 De[place], [place]. ([id number]) -------------------------------------------------------------------------------- Wound Assessment Details Patient Name: Vanderhoof, Jannae [name]. Date of Service: 03/13/2015 1:00 PM Medical Record Number: [id number] Patient  Account Number: [id number] Date of Birth/Sex: 1945-11-02 (69 y.o. Female) Treating RN: Afful, RN, BSN, [place] Primary Care Physician: Paulita Cradle Other Clinician: Referring Physician: Mariana Arn Treating Physician/Extender: Frann Rider in Treatment: 0 Wound Status Wound Number: 2 Primary Pressure Ulcer Etiology: Wound Location: Right Elbow Wound Open Wounding Event: Gradually Appeared Status: Date Acquired: 02/25/2015 Comorbid Cataracts, Asthma, Hypertension, Type Weeks Of Treatment: 0 History: II Diabetes, Neuropathy, Received Clustered Wound: No Chemotherapy Photos Photo Uploaded By: Regan Lemming on 03/13/2015 16:56:30 Wound Measurements Length: (cm) 2.4 Width: (cm) 1 Depth: (cm) 0.2 Area: (cm) 1.885 Volume: (cm) 0.377 % Reduction in Area: 0% % Reduction in Volume: 0% Epithelialization: None Tunneling: No Undermining: No Wound Description Classification: Category/Stage III Wound Margin: Distinct, outline attached Exudate Amount: Small Exudate Type: Serosanguineous Exudate Color: red, brown Foul Odor After Cleansing: No Wound Bed Granulation Amount: Small (1-33%) Exposed Structure Granulation Quality: Pink, Pale Fascia Exposed: No Necrotic Amount: Medium (34-66%) Fat Layer Exposed: No Necrotic Quality: Adherent Slough Tendon Exposed: No Fugate, Salvador [name]. ([id number]) Muscle Exposed: No Joint Exposed: No Bone Exposed: No Limited to Skin Breakdown Periwound Skin Texture Texture Color No Abnormalities Noted: No No Abnormalities Noted: No Callus: No Atrophie Blanche: No Crepitus: No Cyanosis: No Excoriation: No Ecchymosis: No Fluctuance: No Erythema: No Friable: No Hemosiderin Staining: No Induration: No Mottled: No Localized Edema: No Pallor: No Rash: No Rubor: No Scarring: No Temperature / Pain Moisture Temperature: No  Abnormality No Abnormalities Noted: No Tenderness on Palpation: Yes Dry / Scaly: No Maceration: No Moist:  Yes Wound Preparation Ulcer Cleansing: Rinsed/Irrigated with Saline Topical Anesthetic Applied: Other: lidocaine 4%, Treatment Notes Wound #2 (Right Elbow) 1. Cleansed with: Clean wound with Normal Saline 2. Anesthetic Topical Lidocaine 4% cream to wound bed prior to debridement 3. Peri-wound Care: Skin Prep 4. Dressing Applied: Aquacel Ag 5. Secondary Dressing Applied Bordered Foam Dressing Electronic Signature(s) Signed: 03/13/2015 4:58:02 PM By: Regan Lemming BSN, RN Entered By: Regan Lemming on 03/13/2015 14:18:23 Kyte, Lenisha [name]. ([id number]) -------------------------------------------------------------------------------- Vitals Details Patient Name: [name], Annalaura [name]. Date of Service: 03/13/2015 1:00 PM Medical Record Number: [id number] Patient Account Number: [id number] Date of Birth/Sex: [birth year] (69 y.o. Female) Treating RN: Afful, RN, BSN, [place] Primary Care Physician: Paulita Cradle Other Clinician: Referring Physician: Mariana Arn Treating Physician/Extender: Frann Rider in Treatment: 0 Vital Signs Time Taken: 13:44 Temperature (F): 98.6 Height (in): 65 Pulse (bpm): 72 Source: Stated Respiratory Rate (breaths/min): 16 Weight (lbs): 118 Blood Pressure (mmHg): 110/80 Source: Stated Reference Range: 80 - 120 mg / dl Body Mass Index (BMI): 19.6 Electronic Signature(s) Signed: 03/13/2015 4:58:02 PM By: Regan Lemming BSN, RN Entered By: Regan Lemming on 03/13/2015 13:45:33

## 2015-03-14 NOTE — Progress Notes (Signed)
DIEFENDORF, Sun [name]. ([id number]) Visit Report for 03/13/2015 Chief Complaint Document Details Patient Name: [name] Date of Service: 03/13/2015 1:00 PM Medical Record Number: [id number] Patient Account Number: [id number] Date of Birth/Sex: [birth year] (69 y.o. Female) Treating RN: Primary Care Physician: Paulita Cradle Other Clinician: Referring Physician: Mariana Arn Treating Physician/Extender: Frann Rider in Treatment: 0 Information Obtained from: Patient Chief Complaint Patient presents to the wound care center for a consult due non healing wound. Ulcers on the right elbow and the right heel for about 1 month. Electronic Signature(s) Signed: 03/13/2015 4:41:39 PM By: Christin Fudge MD, FACS Entered By: Christin Fudge on 03/13/2015 14:33:20 [name] ([id number]) -------------------------------------------------------------------------------- Debridement Details Patient Name: Mckenzie [name] Date of Service: 03/13/2015 1:00 PM Medical Record Number: [id number] Patient Account Number: [id number] Date of Birth/Sex: 10/19/1945 (69 y.o. Female) Treating RN: Primary Care Physician: Carrie Mew, MIRIAM Other Clinician: Referring Physician: Mariana Arn Treating Physician/Extender: Frann Rider in Treatment: 0 Debridement Performed for Wound #1 Right Calcaneous Assessment: Performed By: Physician Pat Patrick., MD Debridement: Debridement Pre-procedure Yes Verification/Time Out Taken: Start Time: 14:06 Pain Control: Lidocaine 4% Topical Solution Level: Skin/Subcutaneous Tissue Total Area Debrided (L x 2.5 (cm) x 3 (cm) = 7.5 (cm) W): Tissue and other Viable, Non-Viable, Eschar, Exudate, Fibrin/Slough, Subcutaneous material debrided: Instrument: Curette Bleeding: Minimum Hemostasis Achieved: Pressure End Time: 14:10 Procedural Pain: 0 Post Procedural Pain: 0 Response to Treatment: Procedure was tolerated well Post Debridement Measurements of Total  Wound Length: (cm) 2.5 Stage: Category/Stage II Width: (cm) 3 Depth: (cm) 0.2 Volume: (cm) 1.178 Electronic Signature(s) Signed: 03/13/2015 4:41:39 PM By: Christin Fudge MD, FACS Entered By: Christin Fudge on 03/13/2015 14:32:12 [name] ([id number]) -------------------------------------------------------------------------------- Debridement Details Patient Name: [name], [name] Date of Service: 03/13/2015 1:00 PM Medical Record Number: [id number] Patient Account Number: [id number] Date of Birth/Sex: 1946/01/25 (69 y.o. Female) Treating RN: Primary Care Physician: Carrie Mew, MIRIAM Other Clinician: Referring Physician: Mariana Arn Treating Physician/Extender: Frann Rider in Treatment: 0 Debridement Performed for Wound #2 Right Elbow Assessment: Performed By: Physician Pat Patrick., MD Debridement: Debridement Pre-procedure Yes Verification/Time Out Taken: Start Time: 14:02 Pain Control: Lidocaine 4% Topical Solution Level: Skin/Subcutaneous Tissue Total Area Debrided (L x 2.4 (cm) x 1 (cm) = 2.4 (cm) W): Tissue and other Viable, Non-Viable, Eschar, Exudate, Fibrin/Slough, Subcutaneous material debrided: Instrument: Curette Bleeding: Minimum Hemostasis Achieved: Pressure End Time: 14:05 Procedural Pain: 0 Post Procedural Pain: 0 Response to Treatment: Procedure was tolerated well Post Debridement Measurements of Total Wound Length: (cm) 2.4 Stage: Category/Stage II Width: (cm) 1 Depth: (cm) 0.2 Volume: (cm) 0.377 Electronic Signature(s) Signed: 03/13/2015 4:41:39 PM By: Christin Fudge MD, FACS Entered By: Christin Fudge on 03/13/2015 14:32:26 [name] ([id number]) -------------------------------------------------------------------------------- HPI Details Patient Name: [name] Date of Service: 03/13/2015 1:00 PM Medical Record Number: [id number] Patient Account Number: [id number] Date of Birth/Sex: 1946-06-03 (69 y.o.  Female) Treating RN: Primary Care Physician: Paulita Cradle Other Clinician: Referring Physician: Mariana Arn Treating Physician/Extender: Frann Rider in Treatment: 0 History of Present Illness Location: Ulceration on the right heel and the right elbow. Quality: Patient reports experiencing a dull pain to affected area(s). Severity: Patient states wound (s) are getting better. Duration: Patient has had the wound for < 4 weeks prior to presenting for treatment Timing: Pain in wound is Intermittent (comes and goes Context: The wound appeared gradually over time Modifying Factors: Consults to this date include:Augmentin and Bactrim and also some heel protection with duoderm Associated Signs and  Symptoms: Patient reports having difficulty standing for long periods. HPI Description: 69 year old female with history of peripheral neuropathy, history of diet controlled diabetes mellitus type 2, history of alcoholism here for wound consult sent by her PCP Dr. Sherilyn Cooter. She has pressure ulcers at her right elbow, bilateral heels. Plain films of right calcaneus without acute bony process. Patient started by PCP on Augmentin, Bactrim as per orders, DuoDerm dressings applied - reports some improvement in her ulcer since last seen. Denies fever, chills, nausea, vomiting, diarrhea. She had a right humerus fracture in the middle of May and has had no surgery and arm is in a sling. She is also been laying in the bed for quite a while. Past medical history significant for essential hypertension, osteoporosis, peripheral neuropathy, alcoholism, ataxia, personal history of breast cancer treated with surgery chemotherapy and radiation and this was done in December 2010. she is also status post laparoscopic cholecystectomy, pilonidal cyst excision, subcutaneous port placement, partial mastectomy on the left side, skin cancer removal. Electronic Signature(s) Signed: 03/13/2015 4:41:39 PM By: Christin Fudge MD, FACS Previous Signature: 03/13/2015 1:47:03 PM Version By: Christin Fudge MD, FACS Entered By: Christin Fudge on 03/13/2015 14:35:30 [name] ([id number]) -------------------------------------------------------------------------------- Physical Exam Details Patient Name: [name] Date of Service: 03/13/2015 1:00 PM Medical Record Number: [id number] Patient Account Number: [id number] Date of Birth/Sex: [birth year] (69 y.o. Female) Treating RN: Primary Care Physician: Paulita Cradle Other Clinician: Referring Physician: Mariana Arn Treating Physician/Extender: Frann Rider in Treatment: 0 Constitutional . Pulse regular. Respirations normal and unlabored. Afebrile. . Eyes Nonicteric. Reactive to light. Ears, Nose, Mouth, and Throat Lips, teeth, and gums WNL.Marland Kitchen Moist mucosa without lesions . Neck supple and nontender. No palpable supraclavicular or cervical adenopathy. Normal sized without goiter. Respiratory WNL. No retractions.. Cardiovascular Pedal Pulses WNL. ABI on the left is 1.0 on the right is 1.07. No clubbing, cyanosis or edema. Gastrointestinal (GI) Abdomen without masses or tenderness.. No liver or spleen enlargement or tenderness.. Musculoskeletal Adexa without tenderness or enlargement.. Digits and nails w/o clubbing, cyanosis, infection, petechiae, ischemia, or inflammatory conditions.. Integumentary (Hair, Skin) No suspicious lesions. No crepitus or fluctuance. No peri-wound warmth or erythema. No masses.Marland Kitchen Psychiatric Judgement and insight Intact.. No evidence of depression, anxiety, or agitation.. 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 Electronic Signature(s) Signed: 03/13/2015 4:41:39 PM By: Christin Fudge MD, FACS Entered By: Christin Fudge on 03/13/2015 14:36:42 [name]  ([id number]) -------------------------------------------------------------------------------- Physician Orders Details Patient Name: Mckenzie [name] Date of Service: 03/13/2015 1:00 PM Medical Record Patient Account Number: [id number] Number: Afful, RN, BSN, Treating RN: 08-26-1946 (69 y.o. Velva Harman Date of Birth/Sex: Female) Other Clinician: Primary Care Physician: Carrie Mew, MIRIAM Treating Jamarious Febo Referring Physician: Mariana Arn Physician/Extender: Suella Grove in Treatment: 0 Verbal / Phone Orders: Yes Clinician: Afful, RN, BSN, Rita Read Back and Verified: Yes Diagnosis Coding Wound Cleansing Wound #1 Right Calcaneous o Clean wound with Normal Saline. o May Shower, gently pat wound dry prior to applying new dressing. Wound #2 Right Elbow o Clean wound with Normal Saline. o May Shower, gently pat wound dry prior to applying new dressing. Skin Barriers/Peri-Wound Care Wound #1 Right Calcaneous o Skin Prep Wound #2 Right Elbow o Skin Prep Primary Wound Dressing Wound #1 Right Calcaneous o Aquacel Ag Wound #2 Right Elbow o Aquacel Ag Secondary Dressing Wound #1 Right Calcaneous   o Boardered Foam Dressing Wound #2 Right Elbow o Boardered Foam Dressing Dressing Change Frequency Wound #1 Right Calcaneous o Change dressing every other day. Feazell, Jalisa [name]. ([id number]) Wound #2 Right Elbow o Change dressing every other day. Follow-up Appointments Wound #1 Right Calcaneous o Return Appointment in 1 week. Wound #2 Right Elbow o Return Appointment in 1 week. Off-Loading Wound #1 Right Calcaneous o Heel suspension boot to: - SAGE boots. Patient instructed to wear at night whiles in bed o Other: - elevate heel on pillow when in bed Wound #2 Right Elbow o Heel suspension boot to: - SAGE boots. Patient instructed to wear at night whiles in bed o Other: - elevate heel on pillow when in bed Electronic Signature(s) Signed:  03/13/2015 4:41:39 PM By: Christin Fudge MD, FACS Signed: 03/13/2015 4:58:02 PM By: Regan Lemming BSN, RN Entered By: Regan Lemming on 03/13/2015 14:13:31 [name] ([id number]) -------------------------------------------------------------------------------- Problem List Details Patient Name: Brake, [name]. Date of Service: 03/13/2015 1:00 PM Medical Record Number: [id number] Patient Account Number: [id number] Date of Birth/Sex: 1946/01/03 (69 y.o. Female) Treating RN: Primary Care Physician: Paulita Cradle Other Clinician: Referring Physician: Mariana Arn Treating Physician/Extender: Frann Rider in Treatment: 0 Active Problems ICD-10 Encounter Code Description Active Date Diagnosis E11.621 Type 2 diabetes mellitus with foot ulcer 03/13/2015 Yes L89.613 Pressure ulcer of right heel, stage 3 03/13/2015 Yes L89.013 Pressure ulcer of right elbow, stage 3 03/13/2015 Yes F17.218 Nicotine dependence, cigarettes, with other nicotine- 03/13/2015 Yes induced disorders Inactive Problems Resolved Problems Electronic Signature(s) Signed: 03/13/2015 4:41:39 PM By: Christin Fudge MD, FACS Entered By: Christin Fudge on 03/13/2015 14:31:52 [name], Aneesha [name]. ([id number]) -------------------------------------------------------------------------------- Progress Note Details Patient Name: Groninger, [name] Date of Service: 03/13/2015 1:00 PM Medical Record Number: [id number] Patient Account Number: [id number] Date of Birth/Sex: June 26, 1946 (69 y.o. Female) Treating RN: Primary Care Physician: Paulita Cradle Other Clinician: Referring Physician: Mariana Arn Treating Physician/Extender: Frann Rider in Treatment: 0 Subjective Chief Complaint Information obtained from Patient Patient presents to the wound care center for a consult due non healing wound. Ulcers on the right elbow and the right heel for about 1 month. History of Present Illness (HPI) The following HPI elements were  documented for the patient's wound: Location: Ulceration on the right heel and the right elbow. Quality: Patient reports experiencing a dull pain to affected area(s). Severity: Patient states wound (s) are getting better. Duration: Patient has had the wound for < 4 weeks prior to presenting for treatment Timing: Pain in wound is Intermittent (comes and goes Context: The wound appeared gradually over time Modifying Factors: Consults to this date include:Augmentin and Bactrim and also some heel protection with duoderm Associated Signs and Symptoms: Patient reports having difficulty standing for long periods. 69 year old female with history of peripheral neuropathy, history of diet controlled diabetes mellitus type 2, history of alcoholism here for wound consult sent by her PCP Dr. Sherilyn Cooter. She has pressure ulcers at her right elbow, bilateral heels. Plain films of right calcaneus without acute bony process. Patient started by PCP on Augmentin, Bactrim as per orders, DuoDerm dressings applied - reports some improvement in her ulcer since last seen. Denies fever, chills, nausea, vomiting, diarrhea. She had a right humerus fracture in the middle of May and has had no surgery and arm is in a sling. She is also been laying in the bed for quite a while. Past medical history significant for essential hypertension, osteoporosis, peripheral neuropathy, alcoholism, ataxia, personal history of breast cancer treated with  surgery chemotherapy and radiation and this was done in December 2010. she is also status post laparoscopic cholecystectomy, pilonidal cyst excision, subcutaneous port placement, partial mastectomy on the left side, skin cancer removal. Wound History Patient presents with 2 open wounds that have been present for approximately 67month. Patient has been treating wounds in the following manner: duoderm. Laboratory tests have been performed in the last month. Patient reportedly has not tested  positive for an antibiotic resistant organism. Patient reportedly has not tested positive for osteomyelitis. Patient reportedly has not had testing performed to evaluate circulation in the legs. Patient experiences the following problems associated with their wounds: infection. Strother, Keniah [name]. ([id number]) Patient History Information obtained from Patient, Caregiver. Allergies erythromycin, codeine (Reaction: hyper), levaquin (Reaction: hallucinations) Family History Cancer - Maternal Grandparents, Diabetes - Father, Heart Disease - Father, No family history of Hereditary Spherocytosis, Hypertension, Kidney Disease, Lung Disease, Seizures, Stroke, Thyroid Problems, Tuberculosis. Social History Smoker, current status unknown, Marital Status - Married, Alcohol Use - Rarely, Drug Use - No History. Medical History Eyes Patient has history of Cataracts - right eye Denies history of Glaucoma, Optic Neuritis Ear/Nose/Mouth/Throat Denies history of Chronic sinus problems/congestion, Middle ear problems Hematologic/Lymphatic Denies history of Anemia, Hemophilia, Human Immunodeficiency Virus, Lymphedema, Sickle Cell Disease Respiratory Patient has history of Asthma Denies history of Aspiration, Chronic Obstructive Pulmonary Disease (COPD), Pneumothorax, Sleep Apnea, Tuberculosis Cardiovascular Patient has history of Hypertension Gastrointestinal Denies history of Cirrhosis , Colitis, Crohn s, Hepatitis A, Hepatitis B, Hepatitis C Endocrine Patient has history of Type II Diabetes Genitourinary Denies history of End Stage Renal Disease Immunological Denies history of Lupus Erythematosus, Raynaud s, Scleroderma Integumentary (Skin) Denies history of History of Burn, History of pressure wounds Musculoskeletal Denies history of Gout, Rheumatoid Arthritis, Osteoarthritis, Osteomyelitis Neurologic Patient has history of Neuropathy - peripheral Oncologic Patient has history of Received  Chemotherapy Psychiatric Denies history of Anorexia/bulimia, Confinement Anxiety Patient is treated with Controlled Diet, Oral Agents. Blood sugar is not tested. Depaz, Latronda [name]. ([id number]) Medical And Surgical History Notes Oncologic breast cancer, s/p lumpectomy Psychiatric insomnia Review of Systems (ROS) Constitutional Symptoms (General Health) The patient has no complaints or symptoms. Eyes Complains or has symptoms of Dry Eyes, Vision Changes, Glasses / Contacts. Ear/Nose/Mouth/Throat The patient has no complaints or symptoms. Hematologic/Lymphatic Denies complaints or symptoms of Bleeding / Clotting Disorders, Human Immunodeficiency Virus. Respiratory The patient has no complaints or symptoms. Cardiovascular The patient has no complaints or symptoms. Gastrointestinal The patient has no complaints or symptoms. Endocrine The patient has no complaints or symptoms. Genitourinary The patient has no complaints or symptoms. Immunological The patient has no complaints or symptoms. Integumentary (Skin) The patient has no complaints or symptoms. Musculoskeletal The patient has no complaints or symptoms. Neurologic Complains or has symptoms of Numbness/parasthesias. Psychiatric Complains or has symptoms of Anxiety - takes xanax qnight. Medications sulfamethoxazole 800 mg-trimethoprim 160 mg tablet oral 1 1 tablet oral hydrocodone 5 mg-acetaminophen 325 mg tablet oral 1 1 tablet oral hydrocodone 5 mg-acetaminophen 325 mg tablet oral one to two tablets oral alprazolam 0.25 mg tablet oral tablet oral gabapentin 400 mg capsule oral 1 capsule oral fexofenadine 180 mg tablet oral 1 1 tablet oral metoprolol succinate ER 100 mg tablet,extended release 24 hr oral 1 1 tablet extended release 24 hr oral olopatadine 0.1 % eye drops ophthalmic 1 1 drops ophthalmic mometasone 50 mcg/actuation nasal spray nasal 2 2 spray,non-aerosol nasal nitrofurantoin macrocrystal 50 mg capsule oral 1  1 capsule oral Micheletti, Joreen [name]. ([id number]) ibuprofen  200 mg tablet oral tablet oral amoxicillin 875 mg-potassium clavulanate 125 mg tablet oral 1 1 tablet oral lansoprazole 30 mg capsule,delayed release oral 1 1 capsule,delayed release(DR/EC) oral zaleplon 10 mg capsule oral capsule oral triamcinolone acetonide 0.1 % topical cream topical cream topical estradiol 0.01% (0.1 mg/gram) vaginal cream vaginal cream vaginal Objective Constitutional Pulse regular. Respirations normal and unlabored. Afebrile. Vitals Time Taken: 1:44 PM, Height: 65 in, Source: Stated, Weight: 118 lbs, Source: Stated, BMI: 19.6, Temperature: 98.6 F, Pulse: 72 bpm, Respiratory Rate: 16 breaths/min, Blood Pressure: 110/80 mmHg. Eyes Nonicteric. Reactive to light. Ears, Nose, Mouth, and Throat Lips, teeth, and gums WNL.Marland Kitchen Moist mucosa without lesions . Neck supple and nontender. No palpable supraclavicular or cervical adenopathy. Normal sized without goiter. Respiratory WNL. No retractions.. Cardiovascular Pedal Pulses WNL. ABI on the left is 1.0 on the right is 1.07. No clubbing, cyanosis or edema. Gastrointestinal (GI) Abdomen without masses or tenderness.. No liver or spleen enlargement or tenderness.. Musculoskeletal Adexa without tenderness or enlargement.. Digits and nails w/o clubbing, cyanosis, infection, petechiae, ischemia, or inflammatory conditions.Marland Kitchen Psychiatric Judgement and insight Intact.. No evidence of depression, anxiety, or agitation.. General Notes: Has a stage III pressure ulcer on the right elbow with significant amount of slough and also her stage III pressure ulcer on the right heel with significant amount of slough. Integumentary (Hair, Skin) Perz, Kealie [name]. ([id number]) No suspicious lesions. No crepitus or fluctuance. No peri-wound warmth or erythema. No masses.. Wound #1 status is Open. Original cause of wound was Gradually Appeared. The wound is located on the Right Calcaneous. The  wound measures 2.5cm length x 3cm width x 0.2cm depth; 5.89cm^2 area and 1.178cm^3 volume. The wound is limited to skin breakdown. There is no undermining noted. There is a small amount of serosanguineous drainage noted. The wound margin is distinct with the outline attached to the wound base. There is small (1-33%) pink, pale granulation within the wound bed. There is a medium (34-66%) amount of necrotic tissue within the wound bed including Adherent Slough. The periwound skin appearance exhibited: Moist. The periwound skin appearance did not exhibit: Callus, Crepitus, Excoriation, Fluctuance, Friable, Induration, Localized Edema, Rash, Scarring, Dry/Scaly, Maceration, Atrophie Blanche, Cyanosis, Ecchymosis, Hemosiderin Staining, Mottled, Pallor, Rubor, Erythema. Periwound temperature was noted as No Abnormality. The periwound has tenderness on palpation. Wound #2 status is Open. Original cause of wound was Gradually Appeared. The wound is located on the Right Elbow. The wound measures 2.4cm length x 1cm width x 0.2cm depth; 1.885cm^2 area and 0.377cm^3 volume. The wound is limited to skin breakdown. There is no tunneling or undermining noted. There is a small amount of serosanguineous drainage noted. The wound margin is distinct with the outline attached to the wound base. There is small (1-33%) pink, pale granulation within the wound bed. There is a medium (34-66%) amount of necrotic tissue within the wound bed including Adherent Slough. The periwound skin appearance exhibited: Moist. The periwound skin appearance did not exhibit: Callus, Crepitus, Excoriation, Fluctuance, Friable, Induration, Localized Edema, Rash, Scarring, Dry/Scaly, Maceration, Atrophie Blanche, Cyanosis, Ecchymosis, Hemosiderin Staining, Mottled, Pallor, Rubor, Erythema. Periwound temperature was noted as No Abnormality. The periwound has tenderness on palpation. Has a stage III pressure ulcer on the right elbow with  significant amount of slough and also her stage III pressure ulcer on the right heel with significant amount of slough. Assessment Active Problems ICD-10 E11.621 - Type 2 diabetes mellitus with foot ulcer L89.613 - Pressure ulcer of right heel, stage 3 L89.013 - Pressure  ulcer of right elbow, stage 3 F17.218 - Nicotine dependence, cigarettes, with other nicotine-induced disorders After sharply debriding the wounds I will use silver alginate and protective foam dressing. We have discussed offloading at length and should also have a Sage boot for a heel at night. Have strongly advised her to give up smoking and I have spent some amount of time counseling her regarding this. She Everingham, Saidah [name]. ([id number]) understands she'll have to come for regular appointments and having onset all her questions she says she is going to be compliant. Procedures Wound #1 Wound #1 is a Pressure Ulcer located on the Right Calcaneous . There was a Skin/Subcutaneous Tissue Debridement ([id number]) debridement with total area of 7.5 sq cm performed by Pat Patrick., MD. with the following instrument(s): Curette to remove Viable and Non-Viable tissue/material including Exudate, Fibrin/Slough, Eschar, and Subcutaneous after achieving pain control using Lidocaine 4% Topical Solution. A time out was conducted prior to the start of the procedure. A Minimum amount of bleeding was controlled with Pressure. The procedure was tolerated well with a pain level of 0 throughout and a pain level of 0 following the procedure. Post Debridement Measurements: 2.5cm length x 3cm width x 0.2cm depth; 1.178cm^3 volume. Post debridement Stage noted as Category/Stage II. Wound #2 Wound #2 is a Pressure Ulcer located on the Right Elbow . There was a Skin/Subcutaneous Tissue Debridement ([id number]) debridement with total area of 2.4 sq cm performed by Pat Patrick., MD. with the following instrument(s): Curette to remove Viable  and Non-Viable tissue/material including Exudate, Fibrin/Slough, Eschar, and Subcutaneous after achieving pain control using Lidocaine 4% Topical Solution. A time out was conducted prior to the start of the procedure. A Minimum amount of bleeding was controlled with Pressure. The procedure was tolerated well with a pain level of 0 throughout and a pain level of 0 following the procedure. Post Debridement Measurements: 2.4cm length x 1cm width x 0.2cm depth; 0.377cm^3 volume. Post debridement Stage noted as Category/Stage II. Plan Wound Cleansing: Wound #1 Right Calcaneous: Clean wound with Normal Saline. May Shower, gently pat wound dry prior to applying new dressing. Wound #2 Right Elbow: Clean wound with Normal Saline. May Shower, gently pat wound dry prior to applying new dressing. Skin Barriers/Peri-Wound Care: Wound #1 Right Calcaneous: Skin Prep Wound #2 Right Elbow: Skin Prep Primary Wound Dressing: Wound #1 Right Calcaneous: Aquacel Ag Wound #2 Right Elbow: Aquacel Ag Secondary Dressing: Behrmann, Shraddha [name]. ([id number]) Wound #1 Right Calcaneous: Boardered Foam Dressing Wound #2 Right Elbow: Boardered Foam Dressing Dressing Change Frequency: Wound #1 Right Calcaneous: Change dressing every other day. Wound #2 Right Elbow: Change dressing every other day. Follow-up Appointments: Wound #1 Right Calcaneous: Return Appointment in 1 week. Wound #2 Right Elbow: Return Appointment in 1 week. Off-Loading: Wound #1 Right Calcaneous: Heel suspension boot to: - SAGE boots. Patient instructed to wear at night whiles in bed Other: - elevate heel on pillow when in bed Wound #2 Right Elbow: Heel suspension boot to: - SAGE boots. Patient instructed to wear at night whiles in bed Other: - elevate heel on pillow when in bed After sharply debriding the wounds I will use silver alginate and protective foam dressing. We have discussed offloading at length and should also have a Sage  boot for a heel at night. Have strongly advised her to give up smoking and I have spent some amount of time counseling her regarding this. She understands she'll have to come for regular appointments and having onset  all her questions she says she is going to be compliant. Electronic Signature(s) Signed: 03/13/2015 4:41:39 PM By: Christin Fudge MD, FACS Entered By: Christin Fudge on 03/13/2015 14:39:00 Date, Munirah [name]. ([id number]) -------------------------------------------------------------------------------- ROS/PFSH Details Patient Name: [name], Nabilah [name]. Date of Service: 03/13/2015 1:00 PM Medical Record Patient Account Number: [id number] Number: Afful, RN, BSN, Treating RN: 1946-02-26 (69 y.o. Velva Harman Date of Birth/Sex: Female) Other Clinician: Primary Care Physician: Carrie Mew, MIRIAM Treating Evangelia Whitaker Referring Physician: Mariana Arn Physician/Extender: Suella Grove in Treatment: 0 Information Obtained From Patient Caregiver Wound History Do you currently have one or more open woundso Yes How many open wounds do you currently haveo 2 Approximately how long have you had your woundso 45month How have you been treating your wound(s) until nowo duoderm Has your wound(s) ever healed and then re-openedo No Have you tested positive for an antibiotic resistant organism (MRSA, VRE)o No Have you tested positive for osteomyelitis (bone infection)o No Have you had any tests for circulation on your legso No Have you had other problems associated with your woundso Infection Eyes Complaints and Symptoms: Positive for: Dry Eyes; Vision Changes; Glasses / Contacts Medical History: Positive for: Cataracts - right eye Negative for: Glaucoma; Optic Neuritis Hematologic/Lymphatic Complaints and Symptoms: Negative for: Bleeding / Clotting Disorders; Human Immunodeficiency Virus Medical History: Negative for: Anemia; Hemophilia; Human Immunodeficiency Virus; Lymphedema; Sickle Cell  Disease Neurologic Complaints and Symptoms: Positive for: Numbness/parasthesias Medical History: Positive for: Neuropathy - peripheral Psychiatric Hammonds, Marchel [name]. ([id number]) Complaints and Symptoms: Positive for: Anxiety - takes xanax qnight Medical History: Negative for: Anorexia/bulimia; Confinement Anxiety Past Medical History Notes: insomnia Constitutional Symptoms (General Health) Complaints and Symptoms: No Complaints or Symptoms Ear/Nose/Mouth/Throat Complaints and Symptoms: No Complaints or Symptoms Medical History: Negative for: Chronic sinus problems/congestion; Middle ear problems Respiratory Complaints and Symptoms: No Complaints or Symptoms Medical History: Positive for: Asthma Negative for: Aspiration; Chronic Obstructive Pulmonary Disease (COPD); Pneumothorax; Sleep Apnea; Tuberculosis Cardiovascular Complaints and Symptoms: No Complaints or Symptoms Medical History: Positive for: Hypertension Gastrointestinal Complaints and Symptoms: No Complaints or Symptoms Medical History: Negative for: Cirrhosis ; Colitis; Crohnos; Hepatitis A; Hepatitis B; Hepatitis C Endocrine Complaints and Symptoms: No Complaints or Symptoms Counihan, Bonetta [name]. ([id number]) Medical History: Positive for: Type II Diabetes Time with diabetes: 5year Treated with: Oral agents, Diet Blood sugar tested every day: No Genitourinary Complaints and Symptoms: No Complaints or Symptoms Medical History: Negative for: End Stage Renal Disease Immunological Complaints and Symptoms: No Complaints or Symptoms Medical History: Negative for: Lupus Erythematosus; Raynaudos; Scleroderma Integumentary (Skin) Complaints and Symptoms: No Complaints or Symptoms Medical History: Negative for: History of Burn; History of pressure wounds Musculoskeletal Complaints and Symptoms: No Complaints or Symptoms Medical History: Negative for: Gout; Rheumatoid Arthritis; Osteoarthritis;  Osteomyelitis Oncologic Medical History: Positive for: Received Chemotherapy Past Medical History Notes: breast cancer, s/p lumpectomy HBO Extended History Items Eyes: Cataracts Family and Social History Groman, Shatiqua [name]. ([id number]) Cancer: Yes - Maternal Grandparents; Diabetes: Yes - Father; Heart Disease: Yes - Father; Hereditary Spherocytosis: No; Hypertension: No; Kidney Disease: No; Lung Disease: No; Seizures: No; Stroke: No; Thyroid Problems: No; Tuberculosis: No; Smoker, current status unknown; Marital Status - Married; Alcohol Use: Rarely; Drug Use: No History; Financial Concerns: No; Food, Clothing or Shelter Needs: No; Support System Lacking: No; Transportation Concerns: No Physician Affirmation I have reviewed and agree with the above information. Electronic Signature(s) Signed: 03/13/2015 4:41:39 PM By: Christin Fudge MD, FACS Signed: 03/13/2015 4:58:02 PM By: Regan Lemming BSN, RN Previous Signature: 03/13/2015 1:47:03 PM  Version By: Christin Fudge MD, FACS Entered By: Christin Fudge on 03/13/2015 14:38:22 Passmore, Sendy [name]. ([id number]) -------------------------------------------------------------------------------- SuperBill Details Patient Name: [name], Harlyn [name]. Date of Service: 03/13/2015 Medical Record Number: [id number] Patient Account Number: [id number] Date of Birth/Sex: 1945-10-29 (69 y.o. 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, stage 3 F17.218 Nicotine dependence, cigarettes, with other nicotine-induced disorders Facility Procedures CPT4 Code: 53748270 Description: 99213 - WOUND CARE VISIT-LEV 3 EST PT Modifier: Quantity: 1 CPT4 Code:  78675449 Description: 11042 - DEB SUBQ TISSUE 20 SQ CM/< ICD-10 Description Diagnosis 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

## 2015-03-14 NOTE — Progress Notes (Signed)
VINZANT, Shantinique H. ([id number]) Visit Report for 03/13/2015 Abuse/Suicide Risk Screen Details Patient Name: NEVILLE, Cathie H. Date of Service: 03/13/2015 1:00 PM Medical Record Patient Account Number: [id number] Number: Afful, RN, BSN, Treating RN: [birth year] (69 y.o. Velva Harman Date of Birth/Sex: Female) Other Clinician: Primary Care Physician: Carrie Mew, MIRIAM Treating Britto, Errol Referring Physician: Mariana Arn Physician/Extender: Weeks in Treatment: 0 Abuse/Suicide Risk Screen Items Answer ABUSE/SUICIDE RISK SCREEN: Has anyone close to you tried to hurt or harm you recentlyo No Do you feel uncomfortable with anyone in your familyo No Has anyone forced you do things that you didnot want to doo No Do you have any thoughts of harming yourselfo No Patient displays signs or symptoms of abuse and/or neglect. No Electronic Signature(s) Signed: 03/13/2015 4:58:02 PM By: Regan Lemming BSN, RN Entered By: Regan Lemming on 03/13/2015 13:31:58 Matters, Shaquinta H. ([id number]) -------------------------------------------------------------------------------- Activities of Daily Living Details Patient Name: Hand, Jennylee H. Date of Service: 03/13/2015 1:00 PM Medical Record Patient Account Number: [id number] Number: Afful, RN, BSN, Treating RN: [birth year] (69 y.o. Velva Harman Date of Birth/Sex: Female) Other Clinician: Primary Care Physician: Carrie Mew, MIRIAM Treating Christin Fudge Referring Physician: Mariana Arn Physician/Extender: Weeks in Treatment: 0 Activities of Daily Living Items Answer Activities of Daily Living (Please select one for each item) Drive Automobile Not Able Take Medications Completely Able Use Telephone Completely Able Care for Appearance Completely Able Use Toilet Completely Able Bath / Shower Completely Able Dress Self Completely Able Feed Self Completely Able Walk Need Assistance Get In / Out Bed Need Assistance Housework Need Assistance Prepare Meals  Need Assistance Handle Money Need Assistance Shop for Self Need Assistance Electronic Signature(s) Signed: 03/13/2015 4:58:02 PM By: Regan Lemming BSN, RN Entered By: Regan Lemming on 03/13/2015 13:31:00 Winiarski, Linde H. ([id number]) -------------------------------------------------------------------------------- Education Assessment Details Patient Name: [name], Kassi H. Date of Service: 03/13/2015 1:00 PM Medical Record Patient Account Number: [id number] Number: Afful, RN, BSN, Treating RN: 04-Jun-1946 (69 y.o. Velva Harman Date of Birth/Sex: Female) Other Clinician: Primary Care Physician: Carrie Mew, MIRIAM Treating Britto, Errol Referring Physician: Mariana Arn Physician/Extender: Suella Grove in Treatment: 0 Primary Learner Assessed: Patient Learning Preferences/Education Level/Primary Language Learning Preference: Explanation Highest Education Level: College or Above Preferred Language: English Cognitive Barrier Assessment/Beliefs Language Barrier: No Physical Barrier Assessment Impaired Vision: Yes Glasses Impaired Hearing: No Decreased Hand dexterity: No Knowledge/Comprehension Assessment Knowledge Level: High Comprehension Level: High Ability to understand written High instructions: Ability to understand verbal High instructions: Motivation Assessment Anxiety Level: Calm Cooperation: Cooperative Education Importance: Acknowledges Need Interest in Health Problems: Asks Questions Perception: Coherent Willingness to Engage in Self- High Management Activities: Readiness to Engage in Self- High Management Activities: Electronic Signature(s) Signed: 03/13/2015 4:58:02 PM By: Regan Lemming BSN, RN Entered By: Regan Lemming on 03/13/2015 13:30:19 Boteler, Zenya H. ([id number]) Pfefferkorn, Joss H. ([id number]) -------------------------------------------------------------------------------- Fall Risk Assessment Details Patient Name: Grenz, Ephrata H. Date of Service: 03/13/2015 1:00  PM Medical Record Patient Account Number: [id number] Number: Afful, RN, BSN, Treating RN: [birth year] (69 y.o. Velva Harman Date of Birth/Sex: Female) Other Clinician: Primary Care Physician: Carrie Mew, MIRIAM Treating Britto, Errol Referring Physician: Mariana Arn Physician/Extender: Suella Grove in Treatment: 0 Fall Risk Assessment Items FALL RISK ASSESSMENT: History of falling - immediate or within 3 months 25 Yes Secondary diagnosis 0 No Ambulatory aid None/bed rest/wheelchair/nurse 0 Yes Crutches/cane/walker 0 No Furniture 0 No IV Access/Saline Lock 0 No Gait/Training Normal/bed rest/immobile 0 Yes Weak 0 No Impaired 0 No Mental Status Oriented to own ability 0 Yes Electronic Signature(s) Signed: 03/13/2015 4:58:02  PM By: Regan Lemming BSN, RN Entered By: Regan Lemming on 03/13/2015 13:29:03 Soliman, Ambry H. ([id number]) -------------------------------------------------------------------------------- Foot Assessment Details Patient Name: Catanzaro, Kathy H. Date of Service: 03/13/2015 1:00 PM Medical Record Patient Account Number: [id number] Number: Afful, RN, BSN, Treating RN: April 11, 1946 (69 y.o. Velva Harman Date of Birth/Sex: Female) Other Clinician: Primary Care Physician: Carrie Mew, MIRIAM Treating Britto, Errol Referring Physician: Mariana Arn Physician/Extender: Weeks in Treatment: 0 Foot Assessment Items Site Locations + = Sensation present, - = Sensation absent, C = Callus, U = Ulcer R = Redness, W = Warmth, M = Maceration, PU = Pre-ulcerative lesion F = Fissure, S = Swelling, D = Dryness Assessment Right: Left: Other Deformity: No No Prior Foot Ulcer: No No Prior Amputation: No No Charcot Joint: No No Ambulatory Status: Ambulatory With Help Assistance Device: Wheelchair Gait: Administrator, arts) Signed: 03/13/2015 4:58:02 PM By: Regan Lemming BSN, RN Entered By: Regan Lemming on 03/13/2015 13:28:34 [name], Edy H. ([id number]) Maina, Drema H.  ([id number]) -------------------------------------------------------------------------------- Nutrition Risk Assessment Details Patient Name: Paschen, Razia H. Date of Service: 03/13/2015 1:00 PM Medical Record Patient Account Number: [id number] Number: Afful, RN, BSN, Treating RN: 04/17/1946 (69 y.o. Velva Harman Date of Birth/Sex: Female) Other Clinician: Primary Care Physician: Carrie Mew, MIRIAM Treating Britto, Errol Referring Physician: Mariana Arn Physician/Extender: Weeks in Treatment: 0 Height (in): Weight (lbs): Body Mass Index (BMI): Nutrition Risk Assessment Items NUTRITION RISK SCREEN: I have an illness or condition that made me change the kind and/or 0 No amount of food I eat I eat fewer than two meals per day 0 No I eat few fruits and vegetables, or milk products 0 No I have three or more drinks of beer, liquor or wine almost every day 0 No I have tooth or mouth problems that make it hard for me to eat 0 No I don't always have enough money to buy the food I need 0 No I eat alone most of the time 0 No I take three or more different prescribed or over-the-counter drugs a 0 No day Without wanting to, I have lost or gained 10 pounds in the last six 2 Yes months I am not always physically able to shop, cook and/or feed myself 0 No Nutrition Protocols Good Risk Protocol 0 No interventions needed Moderate Risk Protocol Electronic Signature(s) Signed: 03/13/2015 4:58:02 PM By: Regan Lemming BSN, RN Entered By: Regan Lemming on 03/13/2015 02:54:27

## 2015-03-21 ENCOUNTER — Encounter: Payer: Medicare Other | Attending: Surgery | Admitting: Surgery

## 2015-03-21 DIAGNOSIS — E11621 Type 2 diabetes mellitus with foot ulcer: Secondary | ICD-10-CM | POA: Diagnosis not present

## 2015-03-21 DIAGNOSIS — Z853 Personal history of malignant neoplasm of breast: Secondary | ICD-10-CM | POA: Insufficient documentation

## 2015-03-21 DIAGNOSIS — G629 Polyneuropathy, unspecified: Secondary | ICD-10-CM | POA: Insufficient documentation

## 2015-03-21 DIAGNOSIS — L89013 Pressure ulcer of right elbow, stage 3: Secondary | ICD-10-CM | POA: Diagnosis not present

## 2015-03-21 DIAGNOSIS — F17218 Nicotine dependence, cigarettes, with other nicotine-induced disorders: Secondary | ICD-10-CM | POA: Insufficient documentation

## 2015-03-21 DIAGNOSIS — R600 Localized edema: Secondary | ICD-10-CM | POA: Diagnosis not present

## 2015-03-21 DIAGNOSIS — L89613 Pressure ulcer of right heel, stage 3: Secondary | ICD-10-CM | POA: Insufficient documentation

## 2015-03-21 DIAGNOSIS — I10 Essential (primary) hypertension: Secondary | ICD-10-CM | POA: Insufficient documentation

## 2015-03-21 NOTE — Progress Notes (Signed)
RUNKLES, Oakleigh H. ([id number]) Visit Report for 03/21/2015 Arrival Information Details Patient Name: [name], [name] Date of Service: 03/21/2015 2:15 PM Medical Record Number: [id number] Patient Account Number: [id number] Date of Birth/Sex: [birth year] (69 y.o. Female) Treating RN: Afful, RN, BSN, Velva Harman Primary Care Physician: Paulita Cradle Other Clinician: Referring Physician: Paulita Cradle Treating Physician/Extender: Frann Rider in Treatment: 1 Visit Information History Since Last Visit Any new allergies or adverse reactions: No Patient Arrived: Wheel Chair Had a fall or experienced change in No activities of daily living that may affect Arrival Time: 14:19 risk of falls: Accompanied By: HUSBAND Signs or symptoms of abuse/neglect since last No Transfer Assistance: Manual visito Patient Identification Verified: Yes Hospitalized since last visit: No Secondary Verification Process Yes Has Dressing in Place as Prescribed: Yes Completed: Pain Present Now: No Patient Requires Transmission-Based No Precautions: Patient Has Alerts: No Electronic Signature(s) Signed: 03/21/2015 2:20:29 PM By: Regan Lemming BSN, RN Entered By: Regan Lemming on 03/21/2015 14:20:29 Gest, Aniyia H. ([id number]) -------------------------------------------------------------------------------- Encounter Discharge Information Details Patient Name: [name], [name] Date of Service: 03/21/2015 2:15 PM Medical Record Number: [id number] Patient Account Number: [id number] Date of Birth/Sex: [birth year] (69 y.o. Female) Treating RN: Afful, RN, BSN, Velva Harman Primary Care Physician: Paulita Cradle Other Clinician: Referring Physician: Paulita Cradle Treating Physician/Extender: Frann Rider in Treatment: 1 Encounter Discharge Information Items Discharge Pain Level: 0 Discharge Condition: Stable Ambulatory Status: Wheelchair Discharge Destination: Home Private Transportation: Auto Accompanied  By: husband Schedule Follow-up Appointment: No Medication Reconciliation completed and No provided to Patient/Care Donta Mcinroy: Clinical Summary of Care: Electronic Signature(s) Signed: 03/21/2015 2:48:20 PM By: Regan Lemming BSN, RN Entered By: Regan Lemming on 03/21/2015 14:48:20 [name], [name] ([id number]) -------------------------------------------------------------------------------- Lower Extremity Assessment Details Patient Name: [name], [name] Date of Service: 03/21/2015 2:15 PM Medical Record Number: [id number] Patient Account Number: [id number] Date of Birth/Sex: 1946-02-08 (69 y.o. Female) Treating RN: Afful, RN, BSN, Velva Harman Primary Care Physician: Paulita Cradle Other Clinician: Referring Physician: Paulita Cradle Treating Physician/Extender: Frann Rider in Treatment: 1 Edema Assessment Assessed: [Left: No] [Right: No] E[Left: dema] [Right: :] Calf Left: Right: Point of Measurement: 32 cm From Medial Instep cm 28.5 cm Ankle Left: Right: Point of Measurement: 8 cm From Medial Instep cm 18.6 cm Vascular Assessment Claudication: Claudication Assessment [Right:None] Pulses: Posterior Tibial Dorsalis Pedis Palpable: [Right:Yes] Extremity colors, hair growth, and conditions: Extremity Color: [Right:Mottled] Hair Growth on Extremity: [Right:No] Temperature of Extremity: [Right:Warm] Capillary Refill: [Right:< 3 seconds] Dependent Rubor: [Right:No] Blanched when Elevated: [Right:No] Lipodermatosclerosis: [Right:No] Toe Nail Assessment Left: Right: Thick: Yes Discolored: Yes Deformed: No Improper Length and Hygiene: No [name], [name] ([id number]) Electronic Signature(s) Signed: 03/21/2015 2:25:47 PM By: Regan Lemming BSN, RN Entered By: Regan Lemming on 03/21/2015 14:25:47 [name], [name] ([id number]) -------------------------------------------------------------------------------- Multi Wound Chart Details Patient Name: [name], [name] Date of Service:  03/21/2015 2:15 PM Medical Record Number: [id number] Patient Account Number: [id number] Date of Birth/Sex: [birth year] (69 y.o. Female) Treating RN: Baruch Gouty, RN, BSN, Velva Harman Primary Care Physician: Paulita Cradle Other Clinician: Referring Physician: Paulita Cradle Treating Physician/Extender: Frann Rider in Treatment: 1 Vital Signs Height(in): 65 Pulse(bpm): 75 Weight(lbs): 118 Blood Pressure 143/79 (mmHg): Body Mass Index(BMI): 20 Temperature(F): 98.5 Respiratory Rate 16 (breaths/min): Photos: [1:No Photos] [2:No Photos] [N/A:N/A] Wound Location: [1:Right Calcaneous] [2:Right Elbow] [N/A:N/A] Wounding Event: [1:Gradually Appeared] [2:Gradually Appeared] [N/A:N/A] Primary Etiology: [1:Pressure Ulcer] [2:Pressure Ulcer] [N/A:N/A] Comorbid History: [1:Cataracts, Asthma, Hypertension, Type II Diabetes, Neuropathy, Received Chemotherapy] [2:Cataracts, Asthma, Hypertension, Type II Diabetes, Neuropathy, Received Chemotherapy] [N/A:N/A] Date  Acquired: [1:02/25/2015] [2:02/25/2015] [N/A:N/A] Weeks of Treatment: [1:1] [2:1] [N/A:N/A] Wound Status: [1:Open] [2:Open] [N/A:N/A] Measurements L x W x D 1.8x2x0.2 [2:1.5x1x0.1] [N/A:N/A] (cm) Area (cm) : [1:2.827] [2:1.178] [N/A:N/A] Volume (cm) : [1:0.565] [2:0.118] [N/A:N/A] % Reduction in Area: [1:52.00%] [2:37.50%] [N/A:N/A] % Reduction in Volume: 52.00% [2:68.70%] [N/A:N/A] Classification: [1:Category/Stage III] [2:Category/Stage III] [N/A:N/A] HBO Classification: [1:Grade 1] [2:N/A] [N/A:N/A] Exudate Amount: [1:Small] [2:None Present] [N/A:N/A] Exudate Type: [1:Serosanguineous] [2:N/A] [N/A:N/A] Exudate Color: [1:red, brown] [2:N/A] [N/A:N/A] Wound Margin: [1:Distinct, outline attached] [2:Distinct, outline attached] [N/A:N/A] Granulation Amount: [1:Small (1-33%)] [2:None Present (0%)] [N/A:N/A] Granulation Quality: [1:Pink, Pale] [2:N/A] [N/A:N/A] Necrotic Amount: [1:Medium (34-66%)] [2:Large (67-100%)]  [N/A:N/A] Necrotic Tissue: [1:Adherent Slough] [2:Eschar] [N/A:N/A] Exposed Structures: [1:Fascia: No Fat: No Tendon: No] [2:Fascia: No Fat: No Tendon: No] [N/A:N/A] Muscle: No Muscle: No Joint: No Joint: No Bone: No Bone: No Limited to Skin Limited to Skin Breakdown Breakdown Epithelialization: Small (1-33%) Small (1-33%) N/A Periwound Skin Texture: Edema: No Edema: No N/A Excoriation: No Excoriation: No Induration: No Induration: No Callus: No Callus: No Crepitus: No Crepitus: No Fluctuance: No Fluctuance: No Friable: No Friable: No Rash: No Rash: No Scarring: No Scarring: No Periwound Skin Moist: Yes Dry/Scaly: Yes N/A Moisture: Maceration: No Maceration: No Dry/Scaly: No Moist: No Periwound Skin Color: Atrophie Blanche: No Atrophie Blanche: No N/A Cyanosis: No Cyanosis: No Ecchymosis: No Ecchymosis: No Erythema: No Erythema: No Hemosiderin Staining: No Hemosiderin Staining: No Mottled: No Mottled: No Pallor: No Pallor: No Rubor: No Rubor: No Temperature: No Abnormality No Abnormality N/A Tenderness on Yes No N/A Palpation: Wound Preparation: Ulcer Cleansing: Ulcer Cleansing: N/A Rinsed/Irrigated with Rinsed/Irrigated with Saline Saline Topical Anesthetic Topical Anesthetic Applied: Other: lidocaine Applied: Other: lidocaine 4% 4% Treatment Notes Electronic Signature(s) Signed: 03/21/2015 2:33:02 PM By: Regan Lemming BSN, RN Entered By: Regan Lemming on 03/21/2015 14:33:02 [name], [name] ([id number]) -------------------------------------------------------------------------------- Multi-Disciplinary Care Plan Details Patient Name: [name], [name] Date of Service: 03/21/2015 2:15 PM Medical Record Number: [id number] Patient Account Number: [id number] Date of Birth/Sex: December 07, 1945 (69 y.o. Female) Treating RN: Afful, RN, BSN, Velva Harman Primary Care Physician: Paulita Cradle Other Clinician: Referring Physician: Paulita Cradle Treating  Physician/Extender: Frann Rider in Treatment: 1 Active Inactive Abuse / Safety / Falls / Self Care Management Nursing Diagnoses: Impaired home maintenance Impaired physical mobility Knowledge deficit related to: safety; personal, health (wound), emergency Potential for falls Self care deficit: actual or potential Goals: Patient will remain injury free Date Initiated: 03/13/2015 Goal Status: Active Patient/caregiver will verbalize understanding of skin care regimen Date Initiated: 03/13/2015 Goal Status: Active Patient/caregiver will verbalize/demonstrate measure taken to improve self care Date Initiated: 03/13/2015 Goal Status: Active Patient/caregiver will verbalize/demonstrate measures taken to improve the patient's personal safety Date Initiated: 03/13/2015 Goal Status: Active Patient/caregiver will verbalize/demonstrate measures taken to prevent injury and/or falls Date Initiated: 03/13/2015 Goal Status: Active Patient/caregiver will verbalize/demonstrate understanding of what to do in case of emergency Date Initiated: 03/13/2015 Goal Status: Active Interventions: Assess fall risk on admission and as needed Assess: immobility, friction, shearing, incontinence upon admission and as needed Assess impairment of mobility on admission and as needed per policy Assess self care needs on admission and as needed Provide education on basic hygiene Brosious, [place]. ([id number]) Provide education on fall prevention Provide education on personal and home safety Provide education on safe transfers Treatment Activities: Education provided on Basic Hygiene : 03/13/2015 Notes: Orientation to the Wound Care Program Nursing Diagnoses: Knowledge deficit related to the wound healing center program Goals: Patient/caregiver will verbalize understanding of the  Wound Healing Center Program Date Initiated: 03/13/2015 Goal Status: Active Interventions: Provide education on orientation to the  wound center Notes: Pressure Nursing Diagnoses: Knowledge deficit related to causes and risk factors for pressure ulcer development Knowledge deficit related to management of pressures ulcers Potential for impaired tissue integrity related to pressure, friction, moisture, and shear Goals: Patient will remain free from development of additional pressure ulcers Date Initiated: 03/13/2015 Goal Status: Active Patient will remain free of pressure ulcers Date Initiated: 03/13/2015 Goal Status: Active Patient/caregiver will verbalize risk factors for pressure ulcer development Date Initiated: 03/13/2015 Goal Status: Active Patient/caregiver will verbalize understanding of pressure ulcer management Date Initiated: 03/13/2015 Goal Status: Active Interventions: Assess: immobility, friction, shearing, incontinence upon admission and as needed Capp, Roxsana H. ([id number]) Assess offloading mechanisms upon admission and as needed Assess potential for pressure ulcer upon admission and as needed Provide education on pressure ulcers Treatment Activities: Patient referred for home evaluation of offloading devices/mattresses : 03/21/2015 Patient referred for pressure reduction/relief devices : 03/21/2015 Patient referred for seating evaluation to ensure proper offloading : 03/21/2015 Pressure reduction/relief device ordered : 03/21/2015 Test ordered outside of clinic : 03/21/2015 Notes: Wound/Skin Impairment Nursing Diagnoses: Impaired tissue integrity Knowledge deficit related to ulceration/compromised skin integrity Goals: Patient/caregiver will verbalize understanding of skin care regimen Date Initiated: 03/13/2015 Goal Status: Active Ulcer/skin breakdown will have a volume reduction of 30% by week 4 Date Initiated: 03/13/2015 Goal Status: Active Ulcer/skin breakdown will have a volume reduction of 50% by week 8 Date Initiated: 03/13/2015 Goal Status: Active Ulcer/skin breakdown will have a volume  reduction of 80% by week 12 Date Initiated: 03/13/2015 Goal Status: Active Ulcer/skin breakdown will heal within 14 weeks Date Initiated: 03/13/2015 Goal Status: Active Interventions: Assess patient/caregiver ability to obtain necessary supplies Assess patient/caregiver ability to perform ulcer/skin care regimen upon admission and as needed Assess ulceration(s) every visit Provide education on smoking Provide education on ulcer and skin care Treatment Activities: Patient referred to home care : 03/21/2015 SKALICKY, Zilphia H. ([id number]) Referred to DME Telma Pyeatt for dressing supplies : 03/21/2015 Skin care regimen initiated : 03/21/2015 Topical wound management initiated : 03/21/2015 Notes: Electronic Signature(s) Signed: 03/21/2015 2:30:36 PM By: Regan Lemming BSN, RN Entered By: Regan Lemming on 03/21/2015 14:30:35 Bekele, Camdyn H. ([id number]) -------------------------------------------------------------------------------- Pain Assessment Details Patient Name: [name], [name] Date of Service: 03/21/2015 2:15 PM Medical Record Number: [id number] Patient Account Number: [id number] Date of Birth/Sex: 02-19-1946 (69 y.o. Female) Treating RN: Baruch Gouty, RN, BSN, Velva Harman Primary Care Physician: Paulita Cradle Other Clinician: Referring Physician: Paulita Cradle Treating Physician/Extender: Frann Rider in Treatment: 1 Active Problems Location of Pain Severity and Description of Pain Patient Has Paino Yes Site Locations Rate the pain. Current Pain Level: 4 Character of Pain Describe the Pain: Aching, Tender Pain Management and Medication Current Pain Management: Medication: Yes How does your pain impact your activities of daily livingo Sleep: Yes Bathing: Yes Appetite: Yes Relationship With Others: Yes Bladder Continence: Yes Emotions: Yes Bowel Continence: Yes Work: Yes Toileting: Yes Drive: Yes Dressing: Yes Hobbies: Yes Electronic Signature(s) Signed: 03/21/2015 2:20:52 PM By:  Regan Lemming BSN, RN Entered By: Regan Lemming on 03/21/2015 14:20:52 Boettcher, [name] ([id number]) -------------------------------------------------------------------------------- Patient/Caregiver Education Details Patient Name: [name], Raechel H. Date of Service: 03/21/2015 2:15 PM Medical Record Number: [id number] Patient Account Number: [id number] Date of Birth/Gender: 02/22/1946 (69 y.o. Female) Treating RN: Afful, RN, BSN, Velva Harman Primary Care Physician: Paulita Cradle Other Clinician: Referring Physician: Paulita Cradle Treating Physician/Extender: Frann Rider in Treatment: 1 Education  Assessment Education Provided To: Patient and Caregiver Education Topics Provided Basic Hygiene: Methods: Explain/Verbal Responses: State content correctly Pressure: Methods: Explain/Verbal Responses: State content correctly Safety: Methods: Explain/Verbal Responses: State content correctly Smoking and Wound Healing: Methods: Explain/Verbal Responses: State content correctly Welcome To [facility] [place]: Methods: Explain/Verbal Responses: State content correctly Wound/Skin Impairment: Methods: Explain/Verbal Responses: State content correctly Electronic Signature(s) Signed: 03/21/2015 2:50:43 PM By: Regan Lemming BSN, RN Entered By: Regan Lemming on 03/21/2015 14:50:42 Yniguez, Samia H. ([id number]) -------------------------------------------------------------------------------- Wound Assessment Details Patient Name: Wernli, Leslyn H. Date of Service: 03/21/2015 2:15 PM Medical Record Number: [id number] Patient Account Number: [id number] Date of Birth/Sex: 10/14/1945 (69 y.o. Female) Treating RN: Afful, RN, BSN, [place] Primary Care Physician: Paulita Cradle Other Clinician: Referring Physician: Paulita Cradle Treating Physician/Extender: Frann Rider in Treatment: 1 Wound Status Wound Number: 1 Primary Pressure Ulcer Etiology: Wound Location: Right Calcaneous Wound  Open Wounding Event: Gradually Appeared Status: Date Acquired: 02/25/2015 Comorbid Cataracts, Asthma, Hypertension, Type Weeks Of Treatment: 1 History: II Diabetes, Neuropathy, Received Clustered Wound: No Chemotherapy Photos Photo Uploaded By: Regan Lemming on 03/21/2015 16:38:05 Wound Measurements Length: (cm) 1.8 Width: (cm) 2 Depth: (cm) 0.2 Area: (cm) 2.827 Volume: (cm) 0.565 % Reduction in Area: 52% % Reduction in Volume: 52% Epithelialization: Small (1-33%) Tunneling: No Undermining: No Wound Description Classification: Category/Stage III Diabetic Severity Earleen Newport): Grade 1 Wound Margin: Distinct, outline attach Exudate Amount: Small Exudate Type: Serosanguineous Exudate Color: red, brown Foul Odor After Cleansing: No ed Wound Bed Granulation Amount: Small (1-33%) Exposed Structure Granulation Quality: Pink, Pale Fascia Exposed: No Necrotic Amount: Medium (34-66%) Fat Layer Exposed: No Oviatt, Chanay H. ([id number]) Necrotic Quality: Adherent Slough Tendon Exposed: No Muscle Exposed: No Joint Exposed: No Bone Exposed: No Limited to Skin Breakdown Periwound Skin Texture Texture Color No Abnormalities Noted: No No Abnormalities Noted: No Callus: No Atrophie Blanche: No Crepitus: No Cyanosis: No Excoriation: No Ecchymosis: No Fluctuance: No Erythema: No Friable: No Hemosiderin Staining: No Induration: No Mottled: No Localized Edema: No Pallor: No Rash: No Rubor: No Scarring: No Temperature / Pain Moisture Temperature: No Abnormality No Abnormalities Noted: No Tenderness on Palpation: Yes Dry / Scaly: No Maceration: No Moist: Yes Wound Preparation Ulcer Cleansing: Rinsed/Irrigated with Saline Topical Anesthetic Applied: Other: lidocaine 4%, Treatment Notes Wound #1 (Right Calcaneous) 1. Cleansed with: Clean wound with Normal Saline 3. Peri-wound Care: Skin Prep 4. Dressing Applied: Aquacel Ag 5. Secondary Dressing Applied Bordered  Foam Dressing Electronic Signature(s) Signed: 03/21/2015 2:29:12 PM By: Regan Lemming BSN, RN Entered By: Regan Lemming on 03/21/2015 14:29:11 Mcbrien, Daena H. ([id number]) -------------------------------------------------------------------------------- Wound Assessment Details Patient Name: Matas, Melia H. Date of Service: 03/21/2015 2:15 PM Medical Record Number: [id number] Patient Account Number: [id number] Date of Birth/Sex: [birth year] (69 y.o. Female) Treating RN: Afful, RN, BSN, [place] Primary Care Physician: Paulita Cradle Other Clinician: Referring Physician: Paulita Cradle Treating Physician/Extender: Frann Rider in Treatment: 1 Wound Status Wound Number: 2 Primary Pressure Ulcer Etiology: Wound Location: Right Elbow Wound Open Wounding Event: Gradually Appeared Status: Date Acquired: 02/25/2015 Comorbid Cataracts, Asthma, Hypertension, Type Weeks Of Treatment: 1 History: II Diabetes, Neuropathy, Received Clustered Wound: No Chemotherapy Photos Photo Uploaded By: Regan Lemming on 03/21/2015 16:38:06 Wound Measurements Length: (cm) 1.5 Width: (cm) 1 Depth: (cm) 0.1 Area: (cm) 1.178 Volume: (cm) 0.118 % Reduction in Area: 37.5% % Reduction in Volume: 68.7% Epithelialization: Small (1-33%) Tunneling: No Undermining: No Wound Description Classification: Category/Stage III Wound Margin: Distinct, outline attached Exudate Amount: None Present Foul Odor After Cleansing: No  Wound Bed Granulation Amount: None Present (0%) Exposed Structure Necrotic Amount: Large (67-100%) Fascia Exposed: No Necrotic Quality: Eschar Fat Layer Exposed: No Tendon Exposed: No Muscle Exposed: No Joint Exposed: No Rex, Melanny H. ([id number]) Bone Exposed: No Limited to Skin Breakdown Periwound Skin Texture Texture Color No Abnormalities Noted: No No Abnormalities Noted: No Callus: No Atrophie Blanche: No Crepitus: No Cyanosis: No Excoriation: No Ecchymosis:  No Fluctuance: No Erythema: No Friable: No Hemosiderin Staining: No Induration: No Mottled: No Localized Edema: No Pallor: No Rash: No Rubor: No Scarring: No Temperature / Pain Moisture Temperature: No Abnormality No Abnormalities Noted: No Dry / Scaly: Yes Maceration: No Moist: No Wound Preparation Ulcer Cleansing: Rinsed/Irrigated with Saline Topical Anesthetic Applied: Other: lidocaine 4%, Treatment Notes Wound #2 (Right Elbow) 1. Cleansed with: Cleanse wound with antibacterial soap and water 4. Dressing Applied: Other dressing (specify in notes) 5. Secondary Dressing Applied Bordered Foam Dressing Notes betadine paint Electronic Signature(s) Signed: 03/21/2015 2:29:42 PM By: Regan Lemming BSN, RN Entered By: Regan Lemming on 03/21/2015 14:29:41 Rapley, Pessy H. ([id number]) -------------------------------------------------------------------------------- Vitals Details Patient Name: [name], Maira H. Date of Service: 03/21/2015 2:15 PM Medical Record Number: [id number] Patient Account Number: [id number] Date of Birth/Sex: [birth year] (69 y.o. Female) Treating RN: Afful, RN, BSN, [place] Primary Care Physician: Paulita Cradle Other Clinician: Referring Physician: Paulita Cradle Treating Physician/Extender: Frann Rider in Treatment: 1 Vital Signs Time Taken: 14:20 Temperature (F): 98.5 Height (in): 65 Pulse (bpm): 75 Weight (lbs): 118 Respiratory Rate (breaths/min): 16 Body Mass Index (BMI): 19.6 Blood Pressure (mmHg): 143/79 Reference Range: 80 - 120 mg / dl Electronic Signature(s) Signed: 03/21/2015 2:23:08 PM By: Regan Lemming BSN, RN Entered By: Regan Lemming on 03/21/2015 14:23:08

## 2015-03-24 NOTE — Progress Notes (Signed)
[name] ([id number]) Visit Report for 03/21/2015 Chief Complaint Document Details Patient Name: [name] Date of Service: 03/21/2015 2:15 PM Medical Record Patient Account Number: [id number] Number: Afful, RN, BSN, Treating RN: 03-02-1946 (69 y.o. [name] Date of Birth/Sex: Female) Other Clinician: Primary Care Physician: Carrie Mew, MIRIAM Treating Christin Fudge Referring Physician: Paulita Cradle Physician/Extender: Suella Grove in Treatment: 1 Information Obtained from: Patient Chief Complaint Patient presents to the wound care center for a consult due non healing wound. Ulcers on the right elbow and the right heel for about 1 month. Electronic Signature(s) Signed: 03/21/2015 2:42:31 PM By: Christin Fudge MD, FACS Entered By: Christin Fudge on 03/21/2015 14:42:31 [name] ([id number]) -------------------------------------------------------------------------------- Debridement Details Patient Name: Mckenzie [name] Date of Service: 03/21/2015 2:15 PM Medical Record Patient Account Number: [id number] Number: Afful, RN, BSN, Treating RN: 07-13-1946 (69 y.o. [name] Date of Birth/Sex: Female) Other Clinician: Primary Care Physician: Carrie Mew, MIRIAM Treating Haven Foss Referring Physician: Paulita Cradle Physician/Extender: Suella Grove in Treatment: 1 Debridement Performed for Wound #1 Right Calcaneous Assessment: Performed By: Physician Pat Patrick., MD Debridement: Debridement Pre-procedure Yes Verification/Time Out Taken: Start Time: 14:35 Pain Control: Lidocaine 4% Topical Solution Level: Skin/Subcutaneous Tissue Total Area Debrided (L x 1.8 (cm) x 2 (cm) = 3.6 (cm) W): Tissue and other Viable, Non-Viable, Exudate, Fibrin/Slough, Subcutaneous material debrided: Instrument: Curette Bleeding: Minimum Hemostasis Achieved: Pressure End Time: 14:37 Procedural Pain: 0 Post Procedural Pain: 0 Response to Treatment: Procedure was  tolerated well Post Debridement Measurements of Total Wound Length: (cm) 1.8 Stage: Category/Stage III Width: (cm) 2 Depth: (cm) 0.2 Volume: (cm) 0.565 Electronic Signature(s) Signed: 03/21/2015 2:42:24 PM By: Christin Fudge MD, FACS Signed: 03/21/2015 4:42:20 PM By: Regan Lemming BSN, RN Previous Signature: 03/21/2015 2:36:12 PM Version By: Regan Lemming BSN, RN Entered By: Christin Fudge on 03/21/2015 14:42:24 [name] ([id number]) -------------------------------------------------------------------------------- HPI Details Patient Name: [name] Date of Service: 03/21/2015 2:15 PM Medical Record Patient Account Number: [id number] Number: Afful, RN, BSN, Treating RN: Aug 16, 1946 (69 y.o. [name] Date of Birth/Sex: Female) Other Clinician: Primary Care Physician: Carrie Mew, MIRIAM Treating Christin Fudge Referring Physician: Paulita Cradle Physician/Extender: Suella Grove in Treatment: 1 History of Present Illness Location: Ulceration on the right heel and the right elbow. Quality: Patient reports experiencing a dull pain to affected area(s). Severity: Patient states wound (s) are getting better. Duration: Patient has had the wound for < 4 weeks prior to presenting for treatment Timing: Pain in wound is Intermittent (comes and goes Context: The wound appeared gradually over time Modifying Factors: Consults to this date include:Augmentin and Bactrim and also some heel protection with duoderm Associated Signs and Symptoms: Patient reports having difficulty standing for long periods. HPI Description: 69 year old female with history of peripheral neuropathy, history of diet controlled diabetes mellitus type 2, history of alcoholism here for wound consult sent by her PCP Dr. Sherilyn Cooter. She has pressure ulcers at her right elbow, bilateral heels. Plain films of right calcaneus without acute bony process. Patient started by PCP on Augmentin, Bactrim as per orders, DuoDerm dressings  applied - reports some improvement in her ulcer since last seen. Denies fever, chills, nausea, vomiting, diarrhea. She had a right humerus fracture in the middle of May and has had no surgery and arm is in a sling. She is also been laying in the bed for quite a while. Past medical history significant for essential hypertension, osteoporosis, peripheral neuropathy, alcoholism, ataxia, personal history of breast cancer treated with surgery chemotherapy and radiation and  this was done in December 2010. she is also status post laparoscopic cholecystectomy, pilonidal cyst excision, subcutaneous port placement, partial mastectomy on the left side, skin cancer removal. 03/21/2015 -- she says overall she's been doing better and continues to smoke about 15 cigarettes a day. Electronic Signature(s) Signed: 03/21/2015 2:42:55 PM By: Christin Fudge MD, FACS Entered By: Christin Fudge on 03/21/2015 14:42:55 [name] ([id number]) -------------------------------------------------------------------------------- Physical Exam Details Patient Name: [name] Date of Service: 03/21/2015 2:15 PM Medical Record Patient Account Number: [id number] Number: Afful, RN, BSN, Treating RN: Jan 21, 1946 (69 y.o. [name] Date of Birth/Sex: Female) Other Clinician: Primary Care Physician: Carrie Mew, MIRIAM Treating Christin Fudge Referring Physician: Paulita Cradle Physician/Extender: Weeks in Treatment: 1 Constitutional . Pulse regular. Respirations normal and unlabored. Afebrile. . Eyes Nonicteric. Reactive to light. Ears, Nose, Mouth, and Throat Lips, teeth, and gums WNL.Marland Kitchen Moist mucosa without lesions . Neck supple and nontender. No palpable supraclavicular or cervical adenopathy. Normal sized without goiter. Respiratory WNL. No retractions.. Cardiovascular Pedal Pulses WNL. No clubbing, cyanosis or edema. Chest Breasts symmetical and no nipple discharge.. Breast tissue WNL, no masses,  lumps, or tenderness.. Musculoskeletal Adexa without tenderness or enlargement.. Digits and nails w/o clubbing, cyanosis, infection, petechiae, ischemia, or inflammatory conditions.. Integumentary (Hair, Skin) No suspicious lesions. No crepitus or fluctuance. No peri-wound warmth or erythema. No masses.Marland Kitchen Psychiatric Judgement and insight Intact.. No evidence of depression, anxiety, or agitation.. Notes the right elbow has formed a eschar and we will leave this alone. The right heel has some significant debris and this will be sharply dissected with a curette. Electronic Signature(s) Signed: 03/21/2015 2:43:49 PM By: Christin Fudge MD, FACS Entered By: Christin Fudge on 03/21/2015 14:43:48 [name] ([id number]) -------------------------------------------------------------------------------- Physician Orders Details Patient Name: Mckenzie [name] Date of Service: 03/21/2015 2:15 PM Medical Record Patient Account Number: [id number] Number: Afful, RN, BSN, Treating RN: [birth year] (69 y.o. [name] Date of Birth/Sex: Female) Other Clinician: Primary Care Physician: Carrie Mew, MIRIAM Treating Capone Schwinn Referring Physician: Paulita Cradle Physician/Extender: Suella Grove in Treatment: 1 Verbal / Phone Orders: Yes Clinician: Afful, RN, BSN, Rita Read Back and Verified: Yes Diagnosis Coding Wound Cleansing Wound #1 Right Calcaneous o Cleanse wound with mild soap and water o May Shower, gently pat wound dry prior to applying new dressing. o May shower with protection. Wound #2 Right Elbow o Cleanse wound with mild soap and water o May Shower, gently pat wound dry prior to applying new dressing. o May shower with protection. Primary Wound Dressing Wound #2 Right Elbow o Other: - betadine paint Wound #1 Right Calcaneous o Aquacel Ag Secondary Dressing Wound #1 Right Calcaneous o Boardered Foam Dressing Wound #2 Right Elbow o Boardered Foam  Dressing Dressing Change Frequency o Change dressing every other day. Wound #2 Right Elbow o Change dressing every day. Follow-up Appointments Wound #1 Right Calcaneous o Return Appointment in 1 week. Burck, Kristina [name]. ([id number]) Wound #2 Right Elbow o Return Appointment in 1 week. Off-Loading Wound #1 Right Calcaneous o Heel suspension boot to: - sage boots Electronic Signature(s) Signed: 03/21/2015 2:38:06 PM By: Regan Lemming BSN, RN Signed: 03/24/2015 12:34:21 PM By: Christin Fudge MD, FACS Entered By: Regan Lemming on 03/21/2015 14:38:06 Schneiderman, Sherilynn [name]. ([id number]) -------------------------------------------------------------------------------- Problem List Details Patient Name: Kilcrease, Khushboo [name]. Date of Service: 03/21/2015 2:15 PM Medical Record Patient Account Number: [id number] Number: Afful, RN, BSN, Treating RN: 08/05/1946 (69 y.o. [name] Date of Birth/Sex: Female) Other Clinician: Primary Care Physician: Carrie Mew, MIRIAM Treating Elky Funches Referring  Physician: Paulita Cradle Physician/Extender: Suella Grove in Treatment: 1 Active Problems ICD-10 Encounter Code Description Active Date Diagnosis E11.621 Type 2 diabetes mellitus with foot ulcer 03/13/2015 Yes L89.613 Pressure ulcer of right heel, stage 3 03/13/2015 Yes L89.013 Pressure ulcer of right elbow, stage 3 03/13/2015 Yes F17.218 Nicotine dependence, cigarettes, with other nicotine- 03/13/2015 Yes induced disorders Inactive Problems Resolved Problems Electronic Signature(s) Signed: 03/21/2015 2:42:09 PM By: Christin Fudge MD, FACS Entered By: Christin Fudge on 03/21/2015 14:42:09 Vanscyoc, Soma [name]. ([id number]) -------------------------------------------------------------------------------- Progress Note Details Patient Name: [name] Date of Service: 03/21/2015 2:15 PM Medical Record Patient Account Number: [id number] Number: Afful, RN, BSN, Treating RN: 04/09/1946 (69 y.o. [name] Date  of Birth/Sex: Female) Other Clinician: Primary Care Physician: Carrie Mew, MIRIAM Treating Christin Fudge Referring Physician: Paulita Cradle Physician/Extender: Suella Grove in Treatment: 1 Subjective Chief Complaint Information obtained from Patient Patient presents to the wound care center for a consult due non healing wound. Ulcers on the right elbow and the right heel for about 1 month. History of Present Illness (HPI) The following HPI elements were documented for the patient's wound: Location: Ulceration on the right heel and the right elbow. Quality: Patient reports experiencing a dull pain to affected area(s). Severity: Patient states wound (s) are getting better. Duration: Patient has had the wound for < 4 weeks prior to presenting for treatment Timing: Pain in wound is Intermittent (comes and goes Context: The wound appeared gradually over time Modifying Factors: Consults to this date include:Augmentin and Bactrim and also some heel protection with duoderm Associated Signs and Symptoms: Patient reports having difficulty standing for long periods. 69 year old female with history of peripheral neuropathy, history of diet controlled diabetes mellitus type 2, history of alcoholism here for wound consult sent by her PCP Dr. Sherilyn Cooter. She has pressure ulcers at her right elbow, bilateral heels. Plain films of right calcaneus without acute bony process. Patient started by PCP on Augmentin, Bactrim as per orders, DuoDerm dressings applied - reports some improvement in her ulcer since last seen. Denies fever, chills, nausea, vomiting, diarrhea. She had a right humerus fracture in the middle of May and has had no surgery and arm is in a sling. She is also been laying in the bed for quite a while. Past medical history significant for essential hypertension, osteoporosis, peripheral neuropathy, alcoholism, ataxia, personal history of breast cancer treated with surgery chemotherapy and  radiation and this was done in December 2010. she is also status post laparoscopic cholecystectomy, pilonidal cyst excision, subcutaneous port placement, partial mastectomy on the left side, skin cancer removal. 03/21/2015 -- she says overall she's been doing better and continues to smoke about 15 cigarettes a day. [name] ([id number]) Objective Constitutional Pulse regular. Respirations normal and unlabored. Afebrile. Vitals Time Taken: 2:20 PM, Height: 65 in, Weight: 118 lbs, BMI: 19.6, Temperature: 98.5 F, Pulse: 75 bpm, Respiratory Rate: 16 breaths/min, Blood Pressure: 143/79 mmHg. Eyes Nonicteric. Reactive to light. Ears, Nose, Mouth, and Throat Lips, teeth, and gums WNL.Marland Kitchen Moist mucosa without lesions . Neck supple and nontender. No palpable supraclavicular or cervical adenopathy. Normal sized without goiter. Respiratory WNL. No retractions.. Cardiovascular Pedal Pulses WNL. No clubbing, cyanosis or edema. Chest Breasts symmetical and no nipple discharge.. Breast tissue WNL, no masses, lumps, or tenderness.. Musculoskeletal Adexa without tenderness or enlargement.. Digits and nails w/o clubbing, cyanosis, infection, petechiae, ischemia, or inflammatory conditions.Marland Kitchen Psychiatric Judgement and insight Intact.. No evidence of depression, anxiety, or agitation.. General Notes: the right elbow has formed a eschar and we  will leave this alone. The right heel has some significant debris and this will be sharply dissected with a curette. Integumentary (Hair, Skin) No suspicious lesions. No crepitus or fluctuance. No peri-wound warmth or erythema. No masses.. Wound #1 status is Open. Original cause of wound was Gradually Appeared. The wound is located on the Right Calcaneous. The wound measures 1.8cm length x 2cm width x 0.2cm depth; 2.827cm^2 area and 0.565cm^3 volume. The wound is limited to skin breakdown. There is no tunneling or undermining noted. There is a small amount of  serosanguineous drainage noted. The wound margin is distinct with the outline attached to the wound base. There is small (1-33%) pink, pale granulation within the wound bed. There is a medium (34-66%) amount of necrotic tissue within the wound bed including Adherent Slough. The [name] ([id number]) periwound skin appearance exhibited: Moist. The periwound skin appearance did not exhibit: Callus, Crepitus, Excoriation, Fluctuance, Friable, Induration, Localized Edema, Rash, Scarring, Dry/Scaly, Maceration, Atrophie Blanche, Cyanosis, Ecchymosis, Hemosiderin Staining, Mottled, Pallor, Rubor, Erythema. Periwound temperature was noted as No Abnormality. The periwound has tenderness on palpation. Wound #2 status is Open. Original cause of wound was Gradually Appeared. The wound is located on the Right Elbow. The wound measures 1.5cm length x 1cm width x 0.1cm depth; 1.178cm^2 area and 0.118cm^3 volume. The wound is limited to skin breakdown. There is no tunneling or undermining noted. There is a none present amount of drainage noted. The wound margin is distinct with the outline attached to the wound base. There is no granulation within the wound bed. There is a large (67-100%) amount of necrotic tissue within the wound bed including Eschar. The periwound skin appearance exhibited: Dry/Scaly. The periwound skin appearance did not exhibit: Callus, Crepitus, Excoriation, Fluctuance, Friable, Induration, Localized Edema, Rash, Scarring, Maceration, Moist, Atrophie Blanche, Cyanosis, Ecchymosis, Hemosiderin Staining, Mottled, Pallor, Rubor, Erythema. Periwound temperature was noted as No Abnormality. Assessment Active Problems ICD-10 E11.621 - Type 2 diabetes mellitus with foot ulcer L89.613 - Pressure ulcer of right heel, stage 3 L89.013 - Pressure ulcer of right elbow, stage 3 F17.218 - Nicotine dependence, cigarettes, with other nicotine-induced disorders We will use Betadine paint on the  right elbow and put a protective foam dressing over this. On her right heel we will continue to use silver alginate and then use a heel pad. She is offloading well with a Psychologist, sport and exercise and have urged her to continue to work on stopping smoking completely. She says she will try. Procedures Wound #1 Wound #1 is a Pressure Ulcer located on the Right Calcaneous . There was a Skin/Subcutaneous Tissue Debridement ([id number]) debridement with total area of 3.6 sq cm performed by Pat Patrick., MD. with the following instrument(s): Curette to remove Viable and Non-Viable tissue/material including Exudate, Fibrin/Slough, and Subcutaneous after achieving pain control using Lidocaine 4% Topical Solution. A time out was conducted prior to the start of the procedure. A Minimum amount of bleeding was controlled with Pressure. The procedure was tolerated well with a pain level of 0 throughout and a pain level of 0 following the procedure. Post Debridement Measurements: 1.8cm length x 2cm width x 0.2cm depth; 0.565cm^3 Tygart, [name] ([id number]) volume. Post debridement Stage noted as Category/Stage III. Plan Wound Cleansing: Wound #1 Right Calcaneous: Cleanse wound with mild soap and water May Shower, gently pat wound dry prior to applying new dressing. May shower with protection. Wound #2 Right Elbow: Cleanse wound with mild soap and water May Shower, gently pat wound dry prior  to applying new dressing. May shower with protection. Primary Wound Dressing: Wound #2 Right Elbow: Other: - betadine paint Wound #1 Right Calcaneous: Aquacel Ag Secondary Dressing: Wound #1 Right Calcaneous: Boardered Foam Dressing Wound #2 Right Elbow: Boardered Foam Dressing Dressing Change Frequency: Change dressing every other day. Wound #2 Right Elbow: Change dressing every day. Follow-up Appointments: Wound #1 Right Calcaneous: Return Appointment in 1 week. Wound #2 Right Elbow: Return Appointment in 1  week. Off-Loading: Wound #1 Right Calcaneous: Heel suspension boot to: - sage boots We will use Betadine paint on the right elbow and put a protective foam dressing over this. On her right heel we will continue to use silver alginate and then use a heel pad. She is offloading well with a Psychologist, sport and exercise and have urged her to continue to work on stopping smoking completely. She says she will try. we will see her back next week. Guinyard, Galen [name]. ([id number]) Electronic Signature(s) Signed: 03/21/2015 2:44:52 PM By: Christin Fudge MD, FACS Entered By: Christin Fudge on 03/21/2015 14:44:52 Hari, Arthur [name]. ([id number]) -------------------------------------------------------------------------------- SuperBill Details Patient Name: [name], Anniah [name]. Date of Service: 03/21/2015 Medical Record Patient Account Number: [id number] Number: Afful, RN, BSN, Treating RN: [birth year] (69 y.o. [name] Date of Birth/Sex: Female) Other Clinician: Primary Care Physician: Carrie Mew, MIRIAM Treating Denali Becvar Referring Physician: Paulita Cradle Physician/Extender: Suella Grove in Treatment: 1 Diagnosis Coding ICD-10 Codes Code Description 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, stage 3 L89.013 Pressure ulcer of right elbow, stage 3 F17.218 Nicotine dependence, cigarettes, with other nicotine-induced disorders Facility Procedures CPT4 Code Description: [id number] 11042 - DEB SUBQ TISSUE 20 SQ CM/< ICD-10 Description Diagnosis 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, stage 3 L89.013 Pressure ulcer of right elbow, stage 3 F17.218 Nicotine  dependence, cigarettes, with other nicotine Modifier: -induced diso Quantity: 1 rders Physician Procedures CPT4 Code Description: [id number] - WC PHYS SUBQ TISS 20 SQ CM ICD-10 Description Diagnosis 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.613 Pressure ulcer of right  heel, stage 3 L89.013 Pressure ulcer of right elbow, stage 3 F17.218 Nicotine  dependence, cigarettes, with other nicotine Modifier: -induced disor Quantity: 1 ders Electronic Signature(s) Signed: 03/21/2015 2:45:06 PM By: Christin Fudge MD, FACS Entered By: Christin Fudge on 03/21/2015 14:45:06

## 2015-03-28 ENCOUNTER — Encounter: Payer: Medicare Other | Admitting: Surgery

## 2015-03-28 DIAGNOSIS — L89613 Pressure ulcer of right heel, stage 3: Secondary | ICD-10-CM | POA: Diagnosis not present

## 2015-03-29 NOTE — Progress Notes (Signed)
KORBER, Takeisha H. ([id number]) Visit Report for 03/28/2015 Chief Complaint Document Details Patient Name: [name], [name] Date of Service: 03/28/2015 2:15 PM Medical Record Patient Account Number: [id number] Number: Afful, RN, BSN, Treating RN: [birth year] (69 y.o. [name] Date of Birth/Sex: Female) Other Clinician: Primary Care Physician: Carrie Mew, MIRIAM Treating Christin Fudge Referring Physician: Paulita Cradle Physician/Extender: Suella Grove in Treatment: 2 Information Obtained from: Patient Chief Complaint Patient presents to the wound care center for a consult due non healing wound. Ulcers on the right elbow and the right heel for about 1 month. Electronic Signature(s) Signed: 03/28/2015 2:52:07 PM By: Christin Fudge MD, FACS Entered By: Christin Fudge on 03/28/2015 14:52:07 Brennen, [name] ([id number]) -------------------------------------------------------------------------------- Debridement Details Patient Name: [name], [name] Date of Service: 03/28/2015 2:15 PM Medical Record Patient Account Number: [id number] Number: Afful, RN, BSN, Treating RN: [birth year] (69 y.o. [name] Date of Birth/Sex: Female) Other Clinician: Primary Care Physician: Carrie Mew, MIRIAM Treating Toi Stelly Referring Physician: Paulita Cradle Physician/Extender: Suella Grove in Treatment: 2 Debridement Performed for Wound #2 Right Elbow Assessment: Performed By: Physician Pat Patrick., MD Debridement: Debridement Pre-procedure No Verification/Time Out Taken: Start Time: 14:40 Pain Control: Lidocaine 4% Topical Solution Level: Skin/Subcutaneous Tissue Total Area Debrided (L x 1.3 (cm) x 1 (cm) = 1.3 (cm) W): Tissue and other Viable, Non-Viable, Exudate, Fibrin/Slough, Subcutaneous material debrided: Instrument: Curette Bleeding: Minimum Hemostasis Achieved: Pressure End Time: 14:45 Procedural Pain: 0 Post Procedural Pain: 0 Response to Treatment: Procedure was  tolerated well Post Debridement Measurements of Total Wound Length: (cm) 1.3 Stage: Category/Stage III Width: (cm) 1 Depth: (cm) 0.2 Volume: (cm) 0.204 Electronic Signature(s) Signed: 03/28/2015 2:51:58 PM By: Christin Fudge MD, FACS Signed: 03/28/2015 4:49:41 PM By: Regan Lemming BSN, RN Entered By: Christin Fudge on 03/28/2015 14:51:58 [name], [name] ([id number]) -------------------------------------------------------------------------------- HPI Details Patient Name: [name], [name] Date of Service: 03/28/2015 2:15 PM Medical Record Patient Account Number: [id number] Number: Afful, RN, BSN, Treating RN: [birth year] (69 y.o. [name] Date of Birth/Sex: Female) Other Clinician: Primary Care Physician: Carrie Mew, MIRIAM Treating Christin Fudge Referring Physician: Paulita Cradle Physician/Extender: Suella Grove in Treatment: 2 History of Present Illness Location: Ulceration on the right heel and the right elbow. Quality: Patient reports experiencing a dull pain to affected area(s). Severity: Patient states wound (s) are getting better. Duration: Patient has had the wound for < 4 weeks prior to presenting for treatment Timing: Pain in wound is Intermittent (comes and goes Context: The wound appeared gradually over time Modifying Factors: Consults to this date include:Augmentin and Bactrim and also some heel protection with duoderm Associated Signs and Symptoms: Patient reports having difficulty standing for long periods. HPI Description: 69 year old female with history of peripheral neuropathy, history of diet controlled diabetes mellitus type 2, history of alcoholism here for wound consult sent by her PCP Dr. Sherilyn Cooter. She has pressure ulcers at her right elbow, bilateral heels. Plain films of right calcaneus without acute bony process. Patient started by PCP on Augmentin, Bactrim as per orders, DuoDerm dressings applied - reports some improvement in her ulcer since last seen.  Denies fever, chills, nausea, vomiting, diarrhea. She had a right humerus fracture in the middle of Mckenzie and has had no surgery and arm is in a sling. She is also been laying in the bed for quite a while. Past medical history significant for essential hypertension, osteoporosis, peripheral neuropathy, alcoholism, ataxia, personal history of breast cancer treated with surgery chemotherapy and radiation and this was done in December 2010. she is also status post  laparoscopic cholecystectomy, pilonidal cyst excision, subcutaneous port placement, partial mastectomy on the left side, skin cancer removal. 03/21/2015 -- she says overall she's been doing better and continues to smoke about 15 cigarettes a day. 03/21/2015 - her orthopedic doctor has said she Mckenzie require surgery for her right humerus fracture. Electronic Signature(s) Signed: 03/28/2015 2:52:48 PM By: Christin Fudge MD, FACS Entered By: Christin Fudge on 03/28/2015 14:52:47 Tall, [name] ([id number]) -------------------------------------------------------------------------------- Physical Exam Details Patient Name: [name], [name] Date of Service: 03/28/2015 2:15 PM Medical Record Patient Account Number: [id number] Number: Afful, RN, BSN, Treating RN: 04-26-1946 (69 y.o. [name] Date of Birth/Sex: Female) Other Clinician: Primary Care Physician: Carrie Mew, MIRIAM Treating Christin Fudge Referring Physician: Paulita Cradle Physician/Extender: Weeks in Treatment: 2 Constitutional . Pulse regular. Respirations normal and unlabored. Afebrile. . Eyes Nonicteric. Reactive to light. Ears, Nose, Mouth, and Throat Lips, teeth, and gums WNL.Marland Kitchen Moist mucosa without lesions . Neck supple and nontender. No palpable supraclavicular or cervical adenopathy. Normal sized without goiter. Respiratory WNL. No retractions.. Cardiovascular Pedal Pulses WNL. No clubbing, cyanosis or edema. Chest Breasts symmetical and no nipple  discharge.. Breast tissue WNL, no masses, lumps, or tenderness.. Musculoskeletal Adexa without tenderness or enlargement.. Digits and nails w/o clubbing, cyanosis, infection, petechiae, ischemia, or inflammatory conditions.. Integumentary (Hair, Skin) No suspicious lesions. No crepitus or fluctuance. No peri-wound warmth or erythema. No masses.Marland Kitchen Psychiatric Judgement and insight Intact.. No evidence of depression, anxiety, or agitation.. Notes The right elbow eschar has come off and we have a slough covered wound which need sharp debridement. The right heel is looking clean and does not need any sharp debridement today. Electronic Signature(s) Signed: 03/28/2015 2:53:34 PM By: Christin Fudge MD, FACS Entered By: Christin Fudge on 03/28/2015 14:53:34 [name], [name] ([id number]) -------------------------------------------------------------------------------- Physician Orders Details Patient Name: [name], [name] Date of Service: 03/28/2015 2:15 PM Medical Record Patient Account Number: [id number] Number: Afful, RN, BSN, Treating RN: [birth year] (69 y.o. [name] Date of Birth/Sex: Female) Other Clinician: Primary Care Physician: Carrie Mew, MIRIAM Treating Ellorie Kindall Referring Physician: Paulita Cradle Physician/Extender: Suella Grove in Treatment: 2 Verbal / Phone Orders: Yes Clinician: Afful, RN, BSN, Rita Read Back and Verified: Yes Diagnosis Coding Wound Cleansing Wound #1 Right Calcaneous o Cleanse wound with mild soap and water o Mckenzie Shower, gently pat wound dry prior to applying new dressing. o Mckenzie shower with protection. Wound #2 Right Elbow o Cleanse wound with mild soap and water o Mckenzie Shower, gently pat wound dry prior to applying new dressing. o Mckenzie shower with protection. Primary Wound Dressing Wound #1 Right Calcaneous o Aquacel Ag Wound #2 Right Elbow o Aquacel Ag Secondary Dressing Wound #1 Right Calcaneous o Boardered Foam  Dressing Wound #2 Right Elbow o Boardered Foam Dressing Dressing Change Frequency o Change dressing every other day. Wound #2 Right Elbow o Change dressing every day. Follow-up Appointments Wound #1 Right Calcaneous o Return Appointment in 1 week. [name], [name] ([id number]) Wound #2 Right Elbow o Return Appointment in 1 week. Off-Loading Wound #1 Right Calcaneous o Heel suspension boot to: - sage boots Electronic Signature(s) Signed: 03/28/2015 4:32:00 PM By: Christin Fudge MD, FACS Signed: 03/28/2015 4:49:41 PM By: Regan Lemming BSN, RN Entered By: Regan Lemming on 03/28/2015 14:48:20 [name], [name] ([id number]) -------------------------------------------------------------------------------- Problem List Details Patient Name: [name], [name] Date of Service: 03/28/2015 2:15 PM Medical Record Patient Account Number: [id number] Number: Afful, RN, BSN, Treating RN: Jul 07, 1946 (69 y.o. [name] Date of Birth/Sex: Female) Other Clinician: Primary Care Physician:  MCLAUGHLIN, MIRIAM Treating Christin Fudge Referring Physician: Paulita Cradle Physician/Extender: Suella Grove in Treatment: 2 Active Problems ICD-10 Encounter Code Description Active Date Diagnosis E11.621 Type 2 diabetes mellitus with foot ulcer 03/13/2015 Yes L89.613 Pressure ulcer of right heel, stage 3 03/13/2015 Yes L89.013 Pressure ulcer of right elbow, stage 3 03/13/2015 Yes F17.218 Nicotine dependence, cigarettes, with other nicotine- 03/13/2015 Yes induced disorders Inactive Problems Resolved Problems Electronic Signature(s) Signed: 03/28/2015 2:51:46 PM By: Christin Fudge MD, FACS Entered By: Christin Fudge on 03/28/2015 14:51:46 Siedschlag, Linder H. ([id number]) -------------------------------------------------------------------------------- Progress Note Details Patient Name: Venn, Mailin H. Date of Service: 03/28/2015 2:15 PM Medical Record Patient Account Number: [id number] Number: Afful,  RN, BSN, Treating RN: 02/11/1946 (69 y.o. [name] Date of Birth/Sex: Female) Other Clinician: Primary Care Physician: Carrie Mew, MIRIAM Treating Christin Fudge Referring Physician: Paulita Cradle Physician/Extender: Suella Grove in Treatment: 2 Subjective Chief Complaint Information obtained from Patient Patient presents to the wound care center for a consult due non healing wound. Ulcers on the right elbow and the right heel for about 1 month. History of Present Illness (HPI) The following HPI elements were documented for the patient's wound: Location: Ulceration on the right heel and the right elbow. Quality: Patient reports experiencing a dull pain to affected area(s). Severity: Patient states wound (s) are getting better. Duration: Patient has had the wound for < 4 weeks prior to presenting for treatment Timing: Pain in wound is Intermittent (comes and goes Context: The wound appeared gradually over time Modifying Factors: Consults to this date include:Augmentin and Bactrim and also some heel protection with duoderm Associated Signs and Symptoms: Patient reports having difficulty standing for long periods. 69 year old female with history of peripheral neuropathy, history of diet controlled diabetes mellitus type 2, history of alcoholism here for wound consult sent by her PCP Dr. Sherilyn Cooter. She has pressure ulcers at her right elbow, bilateral heels. Plain films of right calcaneus without acute bony process. Patient started by PCP on Augmentin, Bactrim as per orders, DuoDerm dressings applied - reports some improvement in her ulcer since last seen. Denies fever, chills, nausea, vomiting, diarrhea. She had a right humerus fracture in the middle of Mckenzie and has had no surgery and arm is in a sling. She is also been laying in the bed for quite a while. Past medical history significant for essential hypertension, osteoporosis, peripheral neuropathy, alcoholism, ataxia, personal history of breast  cancer treated with surgery chemotherapy and radiation and this was done in December 2010. she is also status post laparoscopic cholecystectomy, pilonidal cyst excision, subcutaneous port placement, partial mastectomy on the left side, skin cancer removal. 03/21/2015 -- she says overall she's been doing better and continues to smoke about 15 cigarettes a day. 03/21/2015 - her orthopedic doctor has said she Mckenzie require surgery for her right humerus fracture. States, Midge H. ([id number]) Objective Constitutional Pulse regular. Respirations normal and unlabored. Afebrile. Vitals Time Taken: 2:28 PM, Height: 65 in, Weight: 118 lbs, BMI: 19.6, Temperature: 98.2 F, Pulse: 77 bpm, Respiratory Rate: 16 breaths/min, Blood Pressure: 138/76 mmHg. Eyes Nonicteric. Reactive to light. Ears, Nose, Mouth, and Throat Lips, teeth, and gums WNL.Marland Kitchen Moist mucosa without lesions . Neck supple and nontender. No palpable supraclavicular or cervical adenopathy. Normal sized without goiter. Respiratory WNL. No retractions.. Cardiovascular Pedal Pulses WNL. No clubbing, cyanosis or edema. Chest Breasts symmetical and no nipple discharge.. Breast tissue WNL, no masses, lumps, or tenderness.. Musculoskeletal Adexa without tenderness or enlargement.. Digits and nails w/o clubbing, cyanosis, infection, petechiae, ischemia, or inflammatory conditions.Marland Kitchen Psychiatric  Judgement and insight Intact.. No evidence of depression, anxiety, or agitation.. General Notes: The right elbow eschar has come off and we have a slough covered wound which need sharp debridement. The right heel is looking clean and does not need any sharp debridement today. Integumentary (Hair, Skin) No suspicious lesions. No crepitus or fluctuance. No peri-wound warmth or erythema. No masses.. Wound #1 status is Open. Original cause of wound was Gradually Appeared. The wound is located on the Right Calcaneous. The wound measures 1.7cm length x 2cm  width x 0.2cm depth; 2.67cm^2 area and 0.534cm^3 volume. The wound is limited to skin breakdown. There is no tunneling or undermining noted. There is a small amount of serosanguineous drainage noted. The wound margin is distinct with the outline attached to the wound base. There is small (1-33%) pink, pale granulation within the wound bed. There is a medium (34-66%) amount of necrotic tissue within the wound bed including Adherent Slough. The Avalos, [name] ([id number]) periwound skin appearance exhibited: Moist. The periwound skin appearance did not exhibit: Callus, Crepitus, Excoriation, Fluctuance, Friable, Induration, Localized Edema, Rash, Scarring, Dry/Scaly, Maceration, Atrophie Blanche, Cyanosis, Ecchymosis, Hemosiderin Staining, Mottled, Pallor, Rubor, Erythema. Periwound temperature was noted as No Abnormality. The periwound has tenderness on palpation. Wound #2 status is Open. Original cause of wound was Gradually Appeared. The wound is located on the Right Elbow. The wound measures 1.3cm length x 1cm width x 0.1cm depth; 1.021cm^2 area and 0.102cm^3 volume. The wound is limited to skin breakdown. There is no tunneling or undermining noted. There is a medium amount of serosanguineous drainage noted. The wound margin is distinct with the outline attached to the wound base. There is small (1-33%) pink, pale granulation within the wound bed. There is a large (67-100%) amount of necrotic tissue within the wound bed including Adherent Slough. The periwound skin appearance exhibited: Moist. The periwound skin appearance did not exhibit: Callus, Crepitus, Excoriation, Fluctuance, Friable, Induration, Localized Edema, Rash, Scarring, Dry/Scaly, Maceration, Atrophie Blanche, Cyanosis, Ecchymosis, Hemosiderin Staining, Mottled, Pallor, Rubor, Erythema. Periwound temperature was noted as No Abnormality. Assessment Active Problems ICD-10 E11.621 - Type 2 diabetes mellitus with foot  ulcer L89.613 - Pressure ulcer of right heel, stage 3 L89.013 - Pressure ulcer of right elbow, stage 3 F17.218 - Nicotine dependence, cigarettes, with other nicotine-induced disorders We will continue with silver alginate and bordered foam dressing on both wounds. Offloading has been discussed in great detail. Smoking cessation has again been discussed in great detail and I have continue to motivate her. She will come back and see me next week. Procedures Wound #2 Wound #2 is a Pressure Ulcer located on the Right Elbow . There was a Skin/Subcutaneous Tissue Debridement ([id number]) debridement with total area of 1.3 sq cm performed by Pat Patrick., MD. with the following instrument(s): Curette to remove Viable and Non-Viable tissue/material including Exudate, Fibrin/Slough, and Subcutaneous after achieving pain control using Lidocaine 4% Topical Solution. A time out was not conducted prior to the start of the procedure. A Minimum amount of bleeding was controlled [name], [name] ([id number]) with Pressure. The procedure was tolerated well with a pain level of 0 throughout and a pain level of 0 following the procedure. Post Debridement Measurements: 1.3cm length x 1cm width x 0.2cm depth; 0.204cm^3 volume. Post debridement Stage noted as Category/Stage III. Plan Wound Cleansing: Wound #1 Right Calcaneous: Cleanse wound with mild soap and water Mckenzie Shower, gently pat wound dry prior to applying new dressing. Mckenzie shower with protection. Wound #2  Right Elbow: Cleanse wound with mild soap and water Mckenzie Shower, gently pat wound dry prior to applying new dressing. Mckenzie shower with protection. Primary Wound Dressing: Wound #1 Right Calcaneous: Aquacel Ag Wound #2 Right Elbow: Aquacel Ag Secondary Dressing: Wound #1 Right Calcaneous: Boardered Foam Dressing Wound #2 Right Elbow: Boardered Foam Dressing Dressing Change Frequency: Change dressing every other day. Wound #2 Right  Elbow: Change dressing every day. Follow-up Appointments: Wound #1 Right Calcaneous: Return Appointment in 1 week. Wound #2 Right Elbow: Return Appointment in 1 week. Off-Loading: Wound #1 Right Calcaneous: Heel suspension boot to: - sage boots [name], [name] H. ([id number]) We will continue with silver alginate and bordered foam dressing on both wounds. Offloading has been discussed in great detail. Smoking cessation has again been discussed in great detail and I have continue to motivate her. She will come back and see me next week. Electronic Signature(s) Signed: 03/28/2015 2:54:39 PM By: Christin Fudge MD, FACS Entered By: Christin Fudge on 03/28/2015 14:54:39 Skidgel, Oluchi H. ([id number]) -------------------------------------------------------------------------------- SuperBill Details Patient Name: [name], Zamarah H. Date of Service: 03/28/2015 Medical Record Patient Account Number: [id number] Number: Afful, RN, BSN, Treating RN: [birth year] (69 y.o. [name] Date of Birth/Sex: Female) Other Clinician: Primary Care Physician: Carrie Mew, MIRIAM Treating Tilly Pernice Referring Physician: Paulita Cradle Physician/Extender: Suella Grove in Treatment: 2 Diagnosis Coding ICD-10 Codes Code Description 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, stage 3 L89.013 Pressure ulcer of right elbow, stage 3 F17.218 Nicotine dependence, cigarettes, with other nicotine-induced disorders Facility Procedures CPT4 Code Description: 25003704 11042 - DEB SUBQ TISSUE 20 SQ CM/< ICD-10 Description Diagnosis 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, stage 3 L89.013 Pressure ulcer of right elbow, stage 3 F17.218 Nicotine  dependence, cigarettes, with other nicotine Modifier: -induced diso Quantity: 1 rders Physician Procedures CPT4 Code Description: [id number] 45038 - WC PHYS SUBQ TISS 20 SQ CM ICD-10 Description  Diagnosis 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, stage 3 L89.013 Pressure ulcer of right elbow, stage 3 F17.218 Nicotine  dependence, cigarettes, with other nicotine Modifier: -induced disor Quantity: 1 ders Electronic Signature(s) Signed: 03/28/2015 2:55:15 PM By: Christin Fudge MD, FACS Previous Signature: 03/28/2015 2:54:52 PM Version By: Christin Fudge MD, FACS Entered By: Christin Fudge on 03/28/2015 14:55:15

## 2015-03-29 NOTE — Progress Notes (Signed)
ACOFF, Daniqua H. ([id number]) Visit Report for 03/28/2015 Arrival Information Details Patient Name: [name], [name] Date of Service: 03/28/2015 2:15 PM Medical Record Number: [id number] Patient Account Number: [id number] Date of Birth/Sex: May 12, 1946 (69 y.o. Female) Treating RN: Afful, RN, BSN, Velva Harman Primary Care Physician: Paulita Cradle Other Clinician: Referring Physician: Paulita Cradle Treating Physician/Extender: Frann Rider in Treatment: 2 Visit Information History Since Last Visit Any new allergies or adverse reactions: No Patient Arrived: Wheel Chair Had a fall or experienced change in No activities of daily living that may affect Arrival Time: 14:26 risk of falls: Accompanied By: husband Signs or symptoms of abuse/neglect since last No Transfer Assistance: Manual visito Patient Identification Verified: Yes Hospitalized since last visit: No Secondary Verification Process Yes Has Dressing in Place as Prescribed: Yes Completed: Pain Present Now: No Patient Requires Transmission-Based No Precautions: Patient Has Alerts: No Electronic Signature(s) Signed: 03/28/2015 4:49:41 PM By: Regan Lemming BSN, RN Entered By: Regan Lemming on 03/28/2015 14:27:03 [name], [name] ([id number]) -------------------------------------------------------------------------------- Encounter Discharge Information Details Patient Name: [name], [name] Date of Service: 03/28/2015 2:15 PM Medical Record Number: [id number] Patient Account Number: [id number] Date of Birth/Sex: 06-22-1946 (69 y.o. Female) Treating RN: Afful, RN, BSN, Velva Harman Primary Care Physician: Paulita Cradle Other Clinician: Referring Physician: Paulita Cradle Treating Physician/Extender: Frann Rider in Treatment: 2 Encounter Discharge Information Items Discharge Pain Level: 0 Discharge Condition: Stable Ambulatory Status: Wheelchair Discharge Destination:  Home Private Transportation: Auto Accompanied By: husband Schedule Follow-up Appointment: No Medication Reconciliation completed and No provided to Patient/Care Remmi Armenteros: Clinical Summary of Care: Electronic Signature(s) Signed: 03/28/2015 4:49:41 PM By: Regan Lemming BSN, RN Entered By: Regan Lemming on 03/28/2015 14:56:57 [name], [name] ([id number]) -------------------------------------------------------------------------------- Lower Extremity Assessment Details Patient Name: [name], [name] Date of Service: 03/28/2015 2:15 PM Medical Record Number: [id number] Patient Account Number: [id number] Date of Birth/Sex: 14-Oct-1945 (69 y.o. Female) Treating RN: Afful, RN, BSN, Velva Harman Primary Care Physician: Paulita Cradle Other Clinician: Referring Physician: Paulita Cradle Treating Physician/Extender: Frann Rider in Treatment: 2 Vascular Assessment Pulses: Posterior Tibial Dorsalis Pedis Palpable: [Right:Yes] Extremity colors, hair growth, and conditions: Extremity Color: [Right:Normal] Hair Growth on Extremity: [Right:No] Temperature of Extremity: [Right:Warm] Capillary Refill: [Right:< 3 seconds] Dependent Rubor: [Right:No] Blanched when Elevated: [Right:No] Lipodermatosclerosis: [Right:No] Toe Nail Assessment Left: Right: Thick: No Discolored: No Deformed: No Improper Length and Hygiene: No Electronic Signature(s) Signed: 03/28/2015 4:49:41 PM By: Regan Lemming BSN, RN Entered By: Regan Lemming on 03/28/2015 14:41:00 [name], [name] ([id number]) -------------------------------------------------------------------------------- Multi Wound Chart Details Patient Name: [name], [name] Date of Service: 03/28/2015 2:15 PM Medical Record Number: [id number] Patient Account Number: [id number] Date of Birth/Sex: Apr 28, 1946 (69 y.o. Female) Treating RN: Baruch Gouty, RN, BSN, Velva Harman Primary Care Physician: Paulita Cradle Other Clinician: Referring Physician: Paulita Cradle Treating Physician/Extender: Frann Rider in Treatment: 2 Vital Signs Height(in): 65 Pulse(bpm): 77 Weight(lbs): 118 Blood Pressure 138/76 (mmHg): Body Mass Index(BMI): 20 Temperature(F): 98.2 Respiratory Rate 16 (breaths/min): Photos: [1:No Photos] [2:No Photos] [N/A:N/A] Wound Location: [1:Right Calcaneous] [2:Right Elbow] [N/A:N/A] Wounding Event: [1:Gradually Appeared] [2:Gradually Appeared] [N/A:N/A] Primary Etiology: [1:Pressure Ulcer] [2:Pressure Ulcer] [N/A:N/A] Comorbid History: [1:Cataracts, Asthma, Hypertension, Type II Diabetes, Neuropathy, Received Chemotherapy] [2:Cataracts, Asthma, Hypertension, Type II Diabetes, Neuropathy, Received Chemotherapy] [N/A:N/A] Date Acquired: [1:02/25/2015] [2:02/25/2015] [N/A:N/A] Weeks of Treatment: [1:2] [2:2] [N/A:N/A] Wound Status: [1:Open] [2:Open] [N/A:N/A] Measurements L x W x D 1.7x2x0.2 [2:1.3x1x0.1] [N/A:N/A] (cm) Area (cm) : [1:2.67] [2:1.021] [N/A:N/A] Volume (cm) : [1:0.534] [2:0.102] [N/A:N/A] % Reduction in Area: [1:54.70%] [2:45.80%] [N/A:N/A] % Reduction in  Volume: 54.70% [2:72.90%] [N/A:N/A] Classification: [1:Category/Stage III] [2:Category/Stage III] [N/A:N/A] HBO Classification: [1:Grade 1] [2:N/A] [N/A:N/A] Exudate Amount: [1:Small] [2:Medium] [N/A:N/A] Exudate Type: [1:Serosanguineous] [2:Serosanguineous] [N/A:N/A] Exudate Color: [1:red, brown] [2:red, brown] [N/A:N/A] Wound Margin: [1:Distinct, outline attached] [2:Distinct, outline attached] [N/A:N/A] Granulation Amount: [1:Small (1-33%)] [2:Small (1-33%)] [N/A:N/A] Granulation Quality: [1:Pink, Pale] [2:Pink, Pale] [N/A:N/A] Necrotic Amount: [1:Medium (34-66%)] [2:Large (67-100%)] [N/A:N/A] Exposed Structures: [1:Fascia: No Fat: No Tendon: No Muscle: No] [2:Fascia: No Fat: No Tendon: No Muscle: No] [N/A:N/A] Joint: No Joint: No Bone: No Bone: No Limited to Skin Limited to Skin Breakdown Breakdown Epithelialization: Small (1-33%)  Small (1-33%) N/A Debridement: N/A Debridement ([id number]) Pain Control: N/A Lidocaine 4% Topical N/A Solution Tissue Debrided: N/A Fibrin/Slough, Exudates, N/A Subcutaneous Level: N/A Skin/Subcutaneous N/A Tissue Debridement Area (sq N/A 1.3 N/A cm): Instrument: N/A Curette N/A Bleeding: N/A Minimum N/A Hemostasis Achieved: N/A Pressure N/A Procedural Pain: N/A 0 N/A Post Procedural Pain: N/A 0 N/A Debridement Treatment N/A Procedure was tolerated N/A Response: well Post Debridement N/A 1.3x1x0.2 N/A Measurements L x W x D (cm) Post Debridement N/A 0.204 N/A Volume: (cm) Post Debridement N/A Category/Stage III N/A Stage: Periwound Skin Texture: Edema: No Edema: No N/A Excoriation: No Excoriation: No Induration: No Induration: No Callus: No Callus: No Crepitus: No Crepitus: No Fluctuance: No Fluctuance: No Friable: No Friable: No Rash: No Rash: No Scarring: No Scarring: No Periwound Skin Moist: Yes Moist: Yes N/A Moisture: Maceration: No Maceration: No Dry/Scaly: No Dry/Scaly: No Periwound Skin Color: Atrophie Blanche: No Atrophie Blanche: No N/A Cyanosis: No Cyanosis: No Ecchymosis: No Ecchymosis: No Erythema: No Erythema: No Hemosiderin Staining: No Hemosiderin Staining: No Mottled: No Mottled: No Pallor: No Pallor: No Rubor: No Rubor: No Temperature: No Abnormality No Abnormality N/A [name], [name] ([id number]) Tenderness on Yes No N/A Palpation: Wound Preparation: Ulcer Cleansing: Ulcer Cleansing: N/A Rinsed/Irrigated with Rinsed/Irrigated with Saline Saline Topical Anesthetic Topical Anesthetic Applied: Other: lidocaine Applied: Other: lidocaine 4% 4% Procedures Performed: N/A Debridement N/A Treatment Notes Electronic Signature(s) Signed: 03/28/2015 4:49:41 PM By: Regan Lemming BSN, RN Entered By: Regan Lemming on 03/28/2015 14:47:45 Grossberg, Bonney H.  ([id number]) -------------------------------------------------------------------------------- Multi-Disciplinary Care Plan Details Patient Name: [name], [name] Date of Service: 03/28/2015 2:15 PM Medical Record Number: [id number] Patient Account Number: [id number] Date of Birth/Sex: [birth year] (69 y.o. Female) Treating RN: Afful, RN, BSN, Velva Harman Primary Care Physician: Paulita Cradle Other Clinician: Referring Physician: Paulita Cradle Treating Physician/Extender: Frann Rider in Treatment: 2 Active Inactive Abuse / Safety / Falls / Self Care Management Nursing Diagnoses: Impaired home maintenance Impaired physical mobility Knowledge deficit related to: safety; personal, health (wound), emergency Potential for falls Self care deficit: actual or potential Goals: Patient will remain injury free Date Initiated: 03/13/2015 Goal Status: Active Patient/caregiver will verbalize understanding of skin care regimen Date Initiated: 03/13/2015 Goal Status: Active Patient/caregiver will verbalize/demonstrate measure taken to improve self care Date Initiated: 03/13/2015 Goal Status: Active Patient/caregiver will verbalize/demonstrate measures taken to improve the patient's personal safety Date Initiated: 03/13/2015 Goal Status: Active Patient/caregiver will verbalize/demonstrate measures taken to prevent injury and/or falls Date Initiated: 03/13/2015 Goal Status: Active Patient/caregiver will verbalize/demonstrate understanding of what to do in case of emergency Date Initiated: 03/13/2015 Goal Status: Active Interventions: Assess fall risk on admission and as needed Assess: immobility, friction, shearing, incontinence upon admission and as needed Assess impairment of mobility on admission and as needed per policy Assess self care needs on admission and as needed Provide education on basic hygiene Joshua, [name] ([id number]) Provide education on  fall prevention Provide education  on personal and home safety Provide education on safe transfers Treatment Activities: Education provided on Basic Hygiene : 03/13/2015 Notes: Orientation to the Wound Care Program Nursing Diagnoses: Knowledge deficit related to the wound healing center program Goals: Patient/caregiver will verbalize understanding of the [place] Date Initiated: 03/13/2015 Goal Status: Active Interventions: Provide education on orientation to the wound center Notes: Pressure Nursing Diagnoses: Knowledge deficit related to causes and risk factors for pressure ulcer development Knowledge deficit related to management of pressures ulcers Potential for impaired tissue integrity related to pressure, friction, moisture, and shear Goals: Patient will remain free from development of additional pressure ulcers Date Initiated: 03/13/2015 Goal Status: Active Patient will remain free of pressure ulcers Date Initiated: 03/13/2015 Goal Status: Active Patient/caregiver will verbalize risk factors for pressure ulcer development Date Initiated: 03/13/2015 Goal Status: Active Patient/caregiver will verbalize understanding of pressure ulcer management Date Initiated: 03/13/2015 Goal Status: Active Interventions: Assess: immobility, friction, shearing, incontinence upon admission and as needed Stfleur, Chenise H. ([id number]) Assess offloading mechanisms upon admission and as needed Assess potential for pressure ulcer upon admission and as needed Provide education on pressure ulcers Treatment Activities: Patient referred for home evaluation of offloading devices/mattresses : 03/28/2015 Patient referred for pressure reduction/relief devices : 03/28/2015 Patient referred for seating evaluation to ensure proper offloading : 03/28/2015 Pressure reduction/relief device ordered : 03/28/2015 Test ordered outside of clinic : 03/28/2015 Notes: Wound/Skin Impairment Nursing Diagnoses: Impaired tissue  integrity Knowledge deficit related to ulceration/compromised skin integrity Goals: Patient/caregiver will verbalize understanding of skin care regimen Date Initiated: 03/13/2015 Goal Status: Active Ulcer/skin breakdown will have a volume reduction of 30% by week 4 Date Initiated: 03/13/2015 Goal Status: Active Ulcer/skin breakdown will have a volume reduction of 50% by week 8 Date Initiated: 03/13/2015 Goal Status: Active Ulcer/skin breakdown will have a volume reduction of 80% by week 12 Date Initiated: 03/13/2015 Goal Status: Active Ulcer/skin breakdown will heal within 14 weeks Date Initiated: 03/13/2015 Goal Status: Active Interventions: Assess patient/caregiver ability to obtain necessary supplies Assess patient/caregiver ability to perform ulcer/skin care regimen upon admission and as needed Assess ulceration(s) every visit Provide education on smoking Provide education on ulcer and skin care Treatment Activities: Patient referred to home care : 03/28/2015 LEIB, Khari H. ([id number]) Referred to DME Lexii Walsh for dressing supplies : 03/28/2015 Skin care regimen initiated : 03/28/2015 Topical wound management initiated : 03/28/2015 Notes: Electronic Signature(s) Signed: 03/28/2015 4:49:41 PM By: Regan Lemming BSN, RN Entered By: Regan Lemming on 03/28/2015 14:47:34 Sprague, Magaly H. ([id number]) -------------------------------------------------------------------------------- Pain Assessment Details Patient Name: [name], Kynsleigh H. Date of Service: 03/28/2015 2:15 PM Medical Record Number: [id number] Patient Account Number: [id number] Date of Birth/Sex: 09/03/1946 (69 y.o. Female) Treating RN: Baruch Gouty, RN, BSN, Velva Harman Primary Care Physician: Paulita Cradle Other Clinician: Referring Physician: Paulita Cradle Treating Physician/Extender: Frann Rider in Treatment: 2 Active Problems Location of Pain Severity and Description of Pain Patient Has Paino Yes Site Locations Pain  Location: Generalized Pain Rate the pain. Current Pain Level: 4 Pain Management and Medication Current Pain Management: Electronic Signature(s) Signed: 03/28/2015 4:49:41 PM By: Regan Lemming BSN, RN Entered By: Regan Lemming on 03/28/2015 14:38:34 [name], [name] ([id number]) -------------------------------------------------------------------------------- Patient/Caregiver Education Details Patient Name: [name], [name] Date of Service: 03/28/2015 2:15 PM Medical Record Number: [id number] Patient Account Number: [id number] Date of Birth/Gender: May 04, 1946 (69 y.o. Female) Treating RN: Afful, RN, BSN, Velva Harman Primary Care Physician: Paulita Cradle Other Clinician: Referring Physician: Paulita Cradle Treating Physician/Extender: Frann Rider  in Treatment: 2 Education Assessment Education Provided To: Patient Education Topics Provided Basic Hygiene: Methods: Explain/Verbal Responses: State content correctly Pressure: Methods: Explain/Verbal Responses: State content correctly Safety: Methods: Explain/Verbal Responses: State content correctly Smoking and Wound Healing: Methods: Explain/Verbal Responses: State content correctly Welcome [facility]: Responses: State content correctly Wound/Skin Impairment: Methods: Explain/Verbal Responses: State content correctly Electronic Signature(s) Signed: 03/28/2015 4:49:41 PM By: Regan Lemming BSN, RN Entered By: Regan Lemming on 03/28/2015 14:57:33 Haubner, Jetaun H. ([id number]) -------------------------------------------------------------------------------- Wound Assessment Details Patient Name: [name], [name] Date of Service: 03/28/2015 2:15 PM Medical Record Number: [id number] Patient Account Number: [id number] Date of Birth/Sex: [birth year] (69 y.o. Female) Treating RN: Afful, RN, BSN, [place] Primary Care Physician: Paulita Cradle Other Clinician: Referring Physician: Paulita Cradle Treating  Physician/Extender: Frann Rider in Treatment: 2 Wound Status Wound Number: 1 Primary Pressure Ulcer Etiology: Wound Location: Right Calcaneous Wound Open Wounding Event: Gradually Appeared Status: Date Acquired: 02/25/2015 Comorbid Cataracts, Asthma, Hypertension, Type Weeks Of Treatment: 2 History: II Diabetes, Neuropathy, Received Clustered Wound: No Chemotherapy Photos Photo Uploaded By: Regan Lemming on 03/28/2015 16:42:24 Wound Measurements Length: (cm) 1.7 Width: (cm) 2 Depth: (cm) 0.2 Area: (cm) 2.67 Volume: (cm) 0.534 % Reduction in Area: 54.7% % Reduction in Volume: 54.7% Epithelialization: Small (1-33%) Tunneling: No Undermining: No Wound Description Classification: Category/Stage III Diabetic Severity Earleen Newport): Grade 1 Wound Margin: Distinct, outline attach Exudate Amount: Small Exudate Type: Serosanguineous Exudate Color: red, brown Foul Odor After Cleansing: No ed Wound Bed Granulation Amount: Small (1-33%) Exposed Structure Granulation Quality: Pink, Pale Fascia Exposed: No Necrotic Amount: Medium (34-66%) Fat Layer Exposed: No Luster, Eowyn H. ([id number]) Necrotic Quality: Adherent Slough Tendon Exposed: No Muscle Exposed: No Joint Exposed: No Bone Exposed: No Limited to Skin Breakdown Periwound Skin Texture Texture Color No Abnormalities Noted: No No Abnormalities Noted: No Callus: No Atrophie Blanche: No Crepitus: No Cyanosis: No Excoriation: No Ecchymosis: No Fluctuance: No Erythema: No Friable: No Hemosiderin Staining: No Induration: No Mottled: No Localized Edema: No Pallor: No Rash: No Rubor: No Scarring: No Temperature / Pain Moisture Temperature: No Abnormality No Abnormalities Noted: No Tenderness on Palpation: Yes Dry / Scaly: No Maceration: No Moist: Yes Wound Preparation Ulcer Cleansing: Rinsed/Irrigated with Saline Topical Anesthetic Applied: Other: lidocaine 4%, Treatment Notes Wound #1 (Right  Calcaneous) 1. Cleansed with: Clean wound with Normal Saline 3. Peri-wound Care: Skin Prep 4. Dressing Applied: Aquacel Ag 5. Secondary Dressing Applied Bordered Foam Dressing Electronic Signature(s) Signed: 03/28/2015 4:49:41 PM By: Regan Lemming BSN, RN Entered By: Regan Lemming on 03/28/2015 14:34:29 Palladino, Noele H. ([id number]) -------------------------------------------------------------------------------- Wound Assessment Details Patient Name: Keener, Kaylanie H. Date of Service: 03/28/2015 2:15 PM Medical Record Number: [id number] Patient Account Number: [id number] Date of Birth/Sex: October 18, 1945 (69 y.o. Female) Treating RN: Afful, RN, BSN, [place] Primary Care Physician: Paulita Cradle Other Clinician: Referring Physician: Paulita Cradle Treating Physician/Extender: Frann Rider in Treatment: 2 Wound Status Wound Number: 2 Primary Pressure Ulcer Etiology: Wound Location: Right Elbow Wound Open Wounding Event: Gradually Appeared Status: Date Acquired: 02/25/2015 Comorbid Cataracts, Asthma, Hypertension, Type Weeks Of Treatment: 2 History: II Diabetes, Neuropathy, Received Clustered Wound: No Chemotherapy Photos Photo Uploaded By: Regan Lemming on 03/28/2015 16:42:25 Wound Measurements Length: (cm) 1.3 Width: (cm) 1 Depth: (cm) 0.1 Area: (cm) 1.021 Volume: (cm) 0.102 % Reduction in Area: 45.8% % Reduction in Volume: 72.9% Epithelialization: Small (1-33%) Tunneling: No Undermining: No Wound Description Classification: Category/Stage III Wound Margin: Distinct, outline attached Exudate Amount: Medium Exudate Type: Serosanguineous Exudate Color: red,  brown Foul Odor After Cleansing: No Wound Bed Granulation Amount: Small (1-33%) Exposed Structure Granulation Quality: Pink, Pale Fascia Exposed: No Necrotic Amount: Large (67-100%) Fat Layer Exposed: No Necrotic Quality: Adherent Slough Tendon Exposed: No Sires, Laylani H. ([id number]) Muscle Exposed:  No Joint Exposed: No Bone Exposed: No Limited to Skin Breakdown Periwound Skin Texture Texture Color No Abnormalities Noted: No No Abnormalities Noted: No Callus: No Atrophie Blanche: No Crepitus: No Cyanosis: No Excoriation: No Ecchymosis: No Fluctuance: No Erythema: No Friable: No Hemosiderin Staining: No Induration: No Mottled: No Localized Edema: No Pallor: No Rash: No Rubor: No Scarring: No Temperature / Pain Moisture Temperature: No Abnormality No Abnormalities Noted: No Dry / Scaly: No Maceration: No Moist: Yes Wound Preparation Ulcer Cleansing: Rinsed/Irrigated with Saline Topical Anesthetic Applied: Other: lidocaine 4%, Treatment Notes Wound #2 (Right Elbow) 1. Cleansed with: Clean wound with Normal Saline 3. Peri-wound Care: Skin Prep 4. Dressing Applied: Aquacel Ag 5. Secondary Dressing Applied Bordered Foam Dressing Electronic Signature(s) Signed: 03/28/2015 4:49:41 PM By: Regan Lemming BSN, RN Entered By: Regan Lemming on 03/28/2015 14:34:52 Wieseler, Nuala H. ([id number]) -------------------------------------------------------------------------------- Vitals Details Patient Name: [name], Krisna H. Date of Service: 03/28/2015 2:15 PM Medical Record Number: [id number] Patient Account Number: [id number] Date of Birth/Sex: 1946-07-14 (69 y.o. Female) Treating RN: Afful, RN, BSN, [place] Primary Care Physician: Paulita Cradle Other Clinician: Referring Physician: Paulita Cradle Treating Physician/Extender: Frann Rider in Treatment: 2 Vital Signs Time Taken: 14:28 Temperature (F): 98.2 Height (in): 65 Pulse (bpm): 77 Weight (lbs): 118 Respiratory Rate (breaths/min): 16 Body Mass Index (BMI): 19.6 Blood Pressure (mmHg): 138/76 Reference Range: 80 - 120 mg / dl Electronic Signature(s) Signed: 03/28/2015 4:49:41 PM By: Regan Lemming BSN, RN Entered By: Regan Lemming on 03/28/2015 14:38:16

## 2015-04-04 ENCOUNTER — Encounter: Payer: Medicare Other | Admitting: Surgery

## 2015-04-04 DIAGNOSIS — L89613 Pressure ulcer of right heel, stage 3: Secondary | ICD-10-CM | POA: Diagnosis not present

## 2015-04-04 NOTE — Progress Notes (Addendum)
[name], [name] ([id number]) Visit Report for 04/04/2015 Arrival Information Details Patient Name: [name], [name] Date of Service: 04/04/2015 2:30 PM Medical Record Number: [id number] Patient Account Number: [id number] Date of Birth/Sex: 01-Dec-1945 (69 y.o. Female) Treating RN: Afful, RN, BSN, Velva Harman Primary Care Physician: Paulita Cradle Other Clinician: Referring Physician: Paulita Cradle Treating Physician/Extender: Frann Rider [name] Treatment: 3 Visit Information History Since Last Visit Any new allergies or adverse reactions: No Patient Arrived: Wheel Chair Had a fall or experienced change [name] No activities of daily living that may affect Arrival Time: 14:33 risk of falls: Accompanied By: husband Signs or symptoms of abuse/neglect since last No Transfer Assistance: None visito Patient Identification Verified: Yes Hospitalized since last visit: No Secondary Verification Process Yes Has Dressing [name] Place as Prescribed: Yes Completed: Pain Present Now: Yes Patient Requires Transmission-Based No Precautions: Patient Has Alerts: No Electronic Signature(s) Signed: 04/04/2015 2:33:40 PM By: Regan Lemming BSN, RN Entered By: Regan Lemming on 04/04/2015 14:33:40 [name], [name] ([id number]) -------------------------------------------------------------------------------- Clinic Level of Care Assessment Details Patient Name: [name], [name] Date of Service: 04/04/2015 2:30 PM Medical Record Number: [id number] Patient Account Number: [id number] Date of Birth/Sex: 03-30-1946 (69 y.o. Female) Treating RN: Cornell Barman Primary Care Physician: Paulita Cradle Other Clinician: Referring Physician: Paulita Cradle Treating Physician/Extender: Frann Rider [name] Treatment: 3 Clinic Level of Care Assessment Items TOOL 4 Quantity Score []  - Use when only an EandM is performed on FOLLOW-UP visit 0 ASSESSMENTS - Nursing Assessment / Reassessment []  - Reassessment of  Co-morbidities (includes updates [name] patient status) 0 X - Reassessment of Adherence to Treatment Plan 1 5 ASSESSMENTS - Wound and Skin Assessment / Reassessment []  - Simple Wound Assessment / Reassessment - one wound 0 X - Complex Wound Assessment / Reassessment - multiple wounds 2 5 []  - Dermatologic / Skin Assessment (not related to wound area) 0 ASSESSMENTS - Focused Assessment []  - Circumferential Edema Measurements - multi extremities 0 []  - Nutritional Assessment / Counseling / Intervention 0 []  - Lower Extremity Assessment (monofilament, tuning fork, pulses) 0 []  - Peripheral Arterial Disease Assessment (using hand held doppler) 0 ASSESSMENTS - Ostomy and/or Continence Assessment and Care []  - Incontinence Assessment and Management 0 []  - Ostomy Care Assessment and Management (repouching, etc.) 0 PROCESS - Coordination of Care X - Simple Patient / Family Education for ongoing care 1 15 []  - Complex (extensive) Patient / Family Education for ongoing care 0 []  - Staff obtains Programmer, systems, Records, Test Results / Process Orders 0 []  - Staff telephones HHA, Nursing Homes / Clarify orders / etc 0 []  - Routine Transfer to another Facility (non-emergent condition) 0 [name], [name] ([id number]) []  - Routine Hospital Admission (non-emergent condition) 0 []  - New Admissions / Biomedical engineer / Ordering NPWT, Apligraf, etc. 0 []  - Emergency Hospital Admission (emergent condition) 0 X - Simple Discharge Coordination 1 10 []  - Complex (extensive) Discharge Coordination 0 PROCESS - Special Needs []  - Pediatric / Minor Patient Management 0 []  - Isolation Patient Management 0 []  - Hearing / Language / Visual special needs 0 []  - Assessment of Community assistance (transportation, D/C planning, etc.) 0 []  - Additional assistance / Altered mentation 0 []  - Support Surface(s) Assessment (bed, cushion, seat, etc.) 0 INTERVENTIONS - Wound Cleansing / Measurement []  - Simple Wound Cleansing  - one wound 0 X - Complex Wound Cleansing - multiple wounds 2 5 []  - Wound Imaging (photographs - any number of wounds) 0 X - Wound Tracing (instead of  photographs) 1 5 []  - Simple Wound Measurement - one wound 0 X - Complex Wound Measurement - multiple wounds 2 5 INTERVENTIONS - Wound Dressings X - Small Wound Dressing one or multiple wounds 2 10 []  - Medium Wound Dressing one or multiple wounds 0 []  - Large Wound Dressing one or multiple wounds 0 []  - Application of Medications - topical 0 []  - Application of Medications - injection 0 INTERVENTIONS - Miscellaneous []  - External ear exam 0 [name], [name] ([id number]) []  - Specimen Collection (cultures, biopsies, blood, body fluids, etc.) 0 []  - Specimen(s) / Culture(s) sent or taken to Lab for analysis 0 []  - Patient Transfer (multiple staff / [name] / Similar devices) 0 []  - Simple Staple / Suture removal (25 or less) 0 []  - Complex Staple / Suture removal (26 or more) 0 []  - Hypo / Hyperglycemic Management (close monitor of Blood Glucose) 0 []  - Ankle / Brachial Index (ABI) - do not check if billed separately 0 X - Vital Signs 1 5 Has the patient been seen at the hospital within the last three years: Yes Total Score: 90 Level Of Care: New/Established - Level 3 Electronic Signature(s) Signed: 04/04/2015 5:03:22 PM By: Gretta Cool, RN, BSN, Kim RN, BSN Entered By: Gretta Cool, RN, BSN, Kim on 04/04/2015 14:58:23 [name], [name] ([id number]) -------------------------------------------------------------------------------- Encounter Discharge Information Details Patient Name: [name], [name] Date of Service: 04/04/2015 2:30 PM Medical Record Number: [id number] Patient Account Number: [id number] Date of Birth/Sex: 1945-10-25 (69 y.o. Female) Treating RN: Afful, RN, BSN, Velva Harman Primary Care Physician: Paulita Cradle Other Clinician: Referring Physician: Paulita Cradle Treating Physician/Extender: Frann Rider [name] Treatment:  3 Encounter Discharge Information Items Discharge Pain Level: 0 Discharge Condition: Stable Ambulatory Status: Wheelchair Discharge Destination: Home Private Transportation: Auto Accompanied By: husband Schedule Follow-up Appointment: No Medication Reconciliation completed and No provided to Patient/Care Treyvion Durkee: Clinical Summary of Care: Electronic Signature(s) Signed: 04/04/2015 3:08:41 PM By: Regan Lemming BSN, RN Entered By: Regan Lemming on 04/04/2015 15:08:41 Bridgeton, Kiwanna H. ([id number]) -------------------------------------------------------------------------------- Lower Extremity Assessment Details Patient Name: [name], [name] Date of Service: 04/04/2015 2:30 PM Medical Record Number: [id number] Patient Account Number: [id number] Date of Birth/Sex: [birth year] (69 y.o. Female) Treating RN: Afful, RN, BSN, Velva Harman Primary Care Physician: Paulita Cradle Other Clinician: Referring Physician: Paulita Cradle Treating Physician/Extender: Frann Rider [name] Treatment: 3 Vascular Assessment Pulses: Posterior Tibial Dorsalis Pedis Palpable: [Right:Yes] Extremity colors, hair growth, and conditions: Extremity Color: [Right:Mottled] Hair Growth on Extremity: [Right:Yes] Temperature of Extremity: [Right:Warm] Capillary Refill: [Right:< 3 seconds] Dependent Rubor: [Right:No] Blanched when Elevated: [Right:No] Toe Nail Assessment Left: Right: Thick: Yes Discolored: Yes Deformed: No Improper Length and Hygiene: No Electronic Signature(s) Signed: 04/04/2015 2:41:46 PM By: Regan Lemming BSN, RN Entered By: Regan Lemming on 04/04/2015 14:41:46 Ruppe, Shahara H. ([id number]) -------------------------------------------------------------------------------- Multi Wound Chart Details Patient Name: [name], [name] Date of Service: 04/04/2015 2:30 PM Medical Record Number: [id number] Patient Account Number: [id number] Date of Birth/Sex: 10/07/1945 (69 y.o. Female) Treating RN: Cornell Barman Primary Care Physician: Paulita Cradle Other Clinician: Referring Physician: Paulita Cradle Treating Physician/Extender: Frann Rider [name] Treatment: 3 Vital Signs Height([name]): 65 Pulse(bpm): 79 Weight(lbs): 118 Blood Pressure 155/73 (mmHg): Body Mass Index(BMI): 20 Temperature(F): 98.3 Respiratory Rate 16 (breaths/min): Photos: [1:No Photos] [2:No Photos] [N/A:N/A] Wound Location: [1:Right Calcaneous] [2:Right Elbow] [N/A:N/A] Wounding Event: [1:Gradually Appeared] [2:Gradually Appeared] [N/A:N/A] Primary Etiology: [1:Pressure Ulcer] [2:Pressure Ulcer] [N/A:N/A] Comorbid History: [1:Cataracts, Asthma, Hypertension, Type II Diabetes, Neuropathy, Received Chemotherapy] [2:Cataracts, Asthma, Hypertension, Type II  Diabetes, Neuropathy, Received Chemotherapy] [N/A:N/A] Date Acquired: [1:02/25/2015] [2:02/25/2015] [N/A:N/A] Weeks of Treatment: [1:3] [2:3] [N/A:N/A] Wound Status: [1:Open] [2:Open] [N/A:N/A] Measurements L x W x D 1x1x0.1 [2:2x1.4x0.1] [N/A:N/A] (cm) Area (cm) : [1:0.785] [2:2.199] [N/A:N/A] Volume (cm) : [1:0.079] [2:0.22] [N/A:N/A] % Reduction [name] Area: [1:86.70%] [2:-16.70%] [N/A:N/A] % Reduction [name] Volume: 93.30% [2:41.60%] [N/A:N/A] Classification: [1:Category/Stage III] [2:Category/Stage III] [N/A:N/A] HBO Classification: [1:Grade 1] [2:N/A] [N/A:N/A] Exudate Amount: [1:None Present] [2:Medium] [N/A:N/A] Exudate Type: [1:N/A] [2:Serosanguineous] [N/A:N/A] Exudate Color: [1:N/A] [2:red, brown] [N/A:N/A] Wound Margin: [1:Distinct, outline attached] [2:Distinct, outline attached] [N/A:N/A] Granulation Amount: [1:Medium (34-66%)] [2:None Present (0%)] [N/A:N/A] Granulation Quality: [1:Pink, Pale] [2:N/A] [N/A:N/A] Necrotic Amount: [1:Medium (34-66%)] [2:Large (67-100%)] [N/A:N/A] Necrotic Tissue: [1:Adherent Slough] [2:Eschar] [N/A:N/A] Exposed Structures: [1:Fascia: No Fat: No Tendon: No] [2:Fascia: No Fat: No Tendon: No] [N/A:N/A] Muscle:  No Muscle: No Joint: No Joint: No Bone: No Bone: No Limited to Skin Limited to Skin Breakdown Breakdown Epithelialization: Small (1-33%) Small (1-33%) N/A Periwound Skin Texture: Edema: Yes Edema: No N/A Excoriation: No Excoriation: No Induration: No Induration: No Callus: No Callus: No Crepitus: No Crepitus: No Fluctuance: No Fluctuance: No Friable: No Friable: No Rash: No Rash: No Scarring: No Scarring: No Periwound Skin Moist: Yes Dry/Scaly: Yes N/A Moisture: Maceration: No Maceration: No Dry/Scaly: No Moist: No Periwound Skin Color: Atrophie Blanche: No Atrophie Blanche: No N/A Cyanosis: No Cyanosis: No Ecchymosis: No Ecchymosis: No Erythema: No Erythema: No Hemosiderin Staining: No Hemosiderin Staining: No Mottled: No Mottled: No Pallor: No Pallor: No Rubor: No Rubor: No Temperature: No Abnormality No Abnormality N/A Tenderness on Yes No N/A Palpation: Wound Preparation: Ulcer Cleansing: Ulcer Cleansing: N/A Rinsed/Irrigated with Rinsed/Irrigated with Saline Saline Topical Anesthetic Topical Anesthetic Applied: Other: lidocaine Applied: Other: lidocaine 4% 4% Treatment Notes Electronic Signature(s) Signed: 04/04/2015 5:03:22 PM By: Gretta Cool, RN, BSN, Kim RN, BSN Entered By: Gretta Cool, RN, BSN, Kim on 04/04/2015 14:54:41 Lagan, Natassia HMarland Kitchen ([id number]) -------------------------------------------------------------------------------- Multi-Disciplinary Care Plan Details Patient Name: [name], [name] Date of Service: 04/04/2015 2:30 PM Medical Record Number: [id number] Patient Account Number: [id number] Date of Birth/Sex: [birth year] (69 y.o. Female) Treating RN: Cornell Barman Primary Care Physician: Paulita Cradle Other Clinician: Referring Physician: Paulita Cradle Treating Physician/Extender: Frann Rider [name] Treatment: 3 Active Inactive Abuse / Safety / Falls / Self Care Management Nursing Diagnoses: Impaired home maintenance Impaired  physical mobility Knowledge deficit related to: safety; personal, health (wound), emergency Potential for falls Self care deficit: actual or potential Goals: Patient will remain injury free Date Initiated: 03/13/2015 Goal Status: Active Patient/caregiver will verbalize understanding of skin care regimen Date Initiated: 03/13/2015 Goal Status: Active Patient/caregiver will verbalize/demonstrate measure taken to improve self care Date Initiated: 03/13/2015 Goal Status: Active Patient/caregiver will verbalize/demonstrate measures taken to improve the patient's personal safety Date Initiated: 03/13/2015 Goal Status: Active Patient/caregiver will verbalize/demonstrate measures taken to prevent injury and/or falls Date Initiated: 03/13/2015 Goal Status: Active Patient/caregiver will verbalize/demonstrate understanding of what to do [name] case of emergency Date Initiated: 03/13/2015 Goal Status: Active Interventions: Assess fall risk on admission and as needed Assess: immobility, friction, shearing, incontinence upon admission and as needed Assess impairment of mobility on admission and as needed per policy Assess self care needs on admission and as needed Provide education on basic hygiene Guin, [place]. ([id number]) Provide education on fall prevention Provide education on personal and home safety Provide education on safe transfers Treatment Activities: Education provided on Basic Hygiene : 03/13/2015 Notes: Orientation to the Wound Care Program Nursing Diagnoses: Knowledge deficit related to the wound healing center  program Goals: Patient/caregiver will verbalize understanding of the [place] Date Initiated: 03/13/2015 Goal Status: Active Interventions: Provide education on orientation to the wound center Notes: Pressure Nursing Diagnoses: Knowledge deficit related to causes and risk factors for pressure ulcer development Knowledge deficit related to management  of pressures ulcers Potential for impaired tissue integrity related to pressure, friction, moisture, and shear Goals: Patient will remain free from development of additional pressure ulcers Date Initiated: 03/13/2015 Goal Status: Active Patient will remain free of pressure ulcers Date Initiated: 03/13/2015 Goal Status: Active Patient/caregiver will verbalize risk factors for pressure ulcer development Date Initiated: 03/13/2015 Goal Status: Active Patient/caregiver will verbalize understanding of pressure ulcer management Date Initiated: 03/13/2015 Goal Status: Active Interventions: Assess: immobility, friction, shearing, incontinence upon admission and as needed Luth, Vinie H. ([id number]) Assess offloading mechanisms upon admission and as needed Assess potential for pressure ulcer upon admission and as needed Provide education on pressure ulcers Treatment Activities: Patient referred for home evaluation of offloading devices/mattresses : 04/04/2015 Patient referred for pressure reduction/relief devices : 04/04/2015 Patient referred for seating evaluation to ensure proper offloading : 04/04/2015 Pressure reduction/relief device ordered : 04/04/2015 Test ordered outside of clinic : 04/04/2015 Notes: Wound/Skin Impairment Nursing Diagnoses: Impaired tissue integrity Knowledge deficit related to ulceration/compromised skin integrity Goals: Patient/caregiver will verbalize understanding of skin care regimen Date Initiated: 03/13/2015 Goal Status: Active Ulcer/skin breakdown will have a volume reduction of 30% by week 4 Date Initiated: 03/13/2015 Goal Status: Active Ulcer/skin breakdown will have a volume reduction of 50% by week 8 Date Initiated: 03/13/2015 Goal Status: Active Ulcer/skin breakdown will have a volume reduction of 80% by week 12 Date Initiated: 03/13/2015 Goal Status: Active Ulcer/skin breakdown will heal within 14 weeks Date Initiated: 03/13/2015 Goal Status:  Active Interventions: Assess patient/caregiver ability to obtain necessary supplies Assess patient/caregiver ability to perform ulcer/skin care regimen upon admission and as needed Assess ulceration(s) every visit Provide education on smoking Provide education on ulcer and skin care Treatment Activities: Patient referred to home care : 04/04/2015 [name], [name] ([id number]) Referred to DME Lynnell Fiumara for dressing supplies : 04/04/2015 Skin care regimen initiated : 04/04/2015 Topical wound management initiated : 04/04/2015 Notes: Electronic Signature(s) Signed: 04/04/2015 5:03:22 PM By: Gretta Cool, RN, BSN, Kim RN, BSN Entered By: Gretta Cool, RN, BSN, Kim on 04/04/2015 14:54:34 Rinck, [name] ([id number]) -------------------------------------------------------------------------------- Pain Assessment Details Patient Name: [name], Inaya H. Date of Service: 04/04/2015 2:30 PM Medical Record Number: [id number] Patient Account Number: [id number] Date of Birth/Sex: [birth year] (69 y.o. Female) Treating RN: Afful, RN, BSN, Velva Harman Primary Care Physician: Paulita Cradle Other Clinician: Referring Physician: Paulita Cradle Treating Physician/Extender: Frann Rider [name] Treatment: 3 Active Problems Location of Pain Severity and Description of Pain Patient Has Paino Yes Site Locations Pain Location: Pain [name] Ulcers Rate the pain. Current Pain Level: 2 Pain Management and Medication Current Pain Management: Electronic Signature(s) Signed: 04/04/2015 2:33:57 PM By: Regan Lemming BSN, RN Entered By: Regan Lemming on 04/04/2015 14:33:57 Jeon, Guerline H. ([id number]) -------------------------------------------------------------------------------- Patient/Caregiver Education Details Patient Name: [name], Adilee H. Date of Service: 04/04/2015 2:30 PM Medical Record Number: [id number] Patient Account Number: [id number] Date of Birth/Gender: [birth year] (69 y.o. Female) Treating RN: Baruch Gouty, RN, BSN, Velva Harman Primary  Care Physician: Paulita Cradle Other Clinician: Referring Physician: Paulita Cradle Treating Physician/Extender: Frann Rider [name] Treatment: 3 Education Assessment Education Provided To: Patient Education Topics Provided Basic Hygiene: Methods: Explain/Verbal Pressure: Methods: Explain/Verbal Responses: State content correctly Safety: Methods: Explain/Verbal Responses: State content correctly Smoking and Wound Healing: Methods:  Explain/Verbal Responses: State content correctly Welcome To [facility] [place]: Methods: Explain/Verbal Responses: State content correctly Wound/Skin Impairment: Methods: Explain/Verbal Responses: State content correctly Electronic Signature(s) Signed: 04/04/2015 3:09:07 PM By: Regan Lemming BSN, RN Entered By: Regan Lemming on 04/04/2015 15:09:07 Ralph, Nadalie H. ([id number]) -------------------------------------------------------------------------------- Wound Assessment Details Patient Name: Capriotti, Sophonie H. Date of Service: 04/04/2015 2:30 PM Medical Record Number: [id number] Patient Account Number: [id number] Date of Birth/Sex: [birth year] (69 y.o. Female) Treating RN: Afful, RN, BSN, [place] Primary Care Physician: Paulita Cradle Other Clinician: Referring Physician: Paulita Cradle Treating Physician/Extender: Frann Rider [name] Treatment: 3 Wound Status Wound Number: 1 Primary Pressure Ulcer Etiology: Wound Location: Right Calcaneous Wound Open Wounding Event: Gradually Appeared Status: Date Acquired: 02/25/2015 Comorbid Cataracts, Asthma, Hypertension, Type Weeks Of Treatment: 3 History: II Diabetes, Neuropathy, Received Clustered Wound: No Chemotherapy Photos Photo Uploaded By: Regan Lemming on 04/04/2015 15:53:24 Wound Measurements Length: (cm) 1 Width: (cm) 1 Depth: (cm) 0.1 Area: (cm) 0.785 Volume: (cm) 0.079 % Reduction [name] Area: 86.7% % Reduction [name] Volume: 93.3% Epithelialization: Small  (1-33%) Tunneling: No Undermining: No Wound Description Classification: Category/Stage III Diabetic Severity Earleen Newport): Grade 1 Wound Margin: Distinct, outline attach Exudate Amount: None Present Foul Odor After Cleansing: No ed Wound Bed Granulation Amount: Medium (34-66%) Exposed Structure Granulation Quality: Pink, Pale Fascia Exposed: No Necrotic Amount: Medium (34-66%) Fat Layer Exposed: No Necrotic Quality: Adherent Slough Tendon Exposed: No Muscle Exposed: No Hellard, Tamiyah H. ([id number]) Joint Exposed: No Bone Exposed: No Limited to Skin Breakdown Periwound Skin Texture Texture Color No Abnormalities Noted: No No Abnormalities Noted: No Callus: No Atrophie Blanche: No Crepitus: No Cyanosis: No Excoriation: No Ecchymosis: No Fluctuance: No Erythema: No Friable: No Hemosiderin Staining: No Induration: No Mottled: No Localized Edema: Yes Pallor: No Rash: No Rubor: No Scarring: No Temperature / Pain Moisture Temperature: No Abnormality No Abnormalities Noted: No Tenderness on Palpation: Yes Dry / Scaly: No Maceration: No Moist: Yes Wound Preparation Ulcer Cleansing: Rinsed/Irrigated with Saline Topical Anesthetic Applied: Other: lidocaine 4%, Treatment Notes Wound #1 (Right Calcaneous) 1. Cleansed with: Clean wound with Normal Saline 3. Peri-wound Care: Skin Prep 4. Dressing Applied: Prisma Ag 5. Secondary Dressing Applied Bordered Foam Dressing Dry Gauze Electronic Signature(s) Signed: 04/04/2015 2:45:53 PM By: Regan Lemming BSN, RN Previous Signature: 04/04/2015 2:44:38 PM Version By: Regan Lemming BSN, RN Entered By: Regan Lemming on 04/04/2015 14:45:53 Cutright, Tayanna H. ([id number]) -------------------------------------------------------------------------------- Wound Assessment Details Patient Name: Stapleton, Jaide H. Date of Service: 04/04/2015 2:30 PM Medical Record Number: [id number] Patient Account Number: [id number] Date of Birth/Sex: January 06, 1946  (69 y.o. Female) Treating RN: Afful, RN, BSN, [place] Primary Care Physician: Paulita Cradle Other Clinician: Referring Physician: Paulita Cradle Treating Physician/Extender: Frann Rider [name] Treatment: 3 Wound Status Wound Number: 2 Primary Pressure Ulcer Etiology: Wound Location: Right Elbow Wound Open Wounding Event: Gradually Appeared Status: Date Acquired: 02/25/2015 Comorbid Cataracts, Asthma, Hypertension, Type Weeks Of Treatment: 3 History: II Diabetes, Neuropathy, Received Clustered Wound: No Chemotherapy Photos Photo Uploaded By: Regan Lemming on 04/04/2015 15:53:41 Wound Measurements Length: (cm) 2 Width: (cm) 1.4 Depth: (cm) 0.1 Area: (cm) 2.199 Volume: (cm) 0.22 % Reduction [name] Area: -16.7% % Reduction [name] Volume: 41.6% Epithelialization: Small (1-33%) Tunneling: No Undermining: No Wound Description Classification: Category/Stage III Wound Margin: Distinct, outline attached Exudate Amount: Medium Exudate Type: Serosanguineous Exudate Color: red, brown Foul Odor After Cleansing: No Wound Bed Granulation Amount: None Present (0%) Exposed Structure Necrotic Amount: Large (67-100%) Fascia Exposed: No Necrotic Quality: Eschar Fat Layer Exposed: No Tendon  Exposed: No Dicenso, Liviana H. ([id number]) Muscle Exposed: No Joint Exposed: No Bone Exposed: No Limited to Skin Breakdown Periwound Skin Texture Texture Color No Abnormalities Noted: No No Abnormalities Noted: No Callus: No Atrophie Blanche: No Crepitus: No Cyanosis: No Excoriation: No Ecchymosis: No Fluctuance: No Erythema: No Friable: No Hemosiderin Staining: No Induration: No Mottled: No Localized Edema: No Pallor: No Rash: No Rubor: No Scarring: No Temperature / Pain Moisture Temperature: No Abnormality No Abnormalities Noted: No Dry / Scaly: Yes Maceration: No Moist: No Wound Preparation Ulcer Cleansing: Rinsed/Irrigated with Saline Topical Anesthetic  Applied: Other: lidocaine 4%, Treatment Notes Wound #2 (Right Elbow) 4. Dressing Applied: Aquacel Ag 5. Secondary Dressing Applied Bordered Foam Dressing Dry Gauze Electronic Signature(s) Signed: 04/04/2015 2:46:33 PM By: Regan Lemming BSN, RN Previous Signature: 04/04/2015 2:44:54 PM Version By: Regan Lemming BSN, RN Entered By: Regan Lemming on 04/04/2015 14:46:33 Cumpston, Zona H. ([id number]) -------------------------------------------------------------------------------- Vitals Details Patient Name: [name], Emmory H. Date of Service: 04/04/2015 2:30 PM Medical Record Number: [id number] Patient Account Number: [id number] Date of Birth/Sex: [birth year] (69 y.o. Female) Treating RN: Afful, RN, BSN, [place] Primary Care Physician: Paulita Cradle Other Clinician: Referring Physician: Paulita Cradle Treating Physician/Extender: Frann Rider [name] Treatment: 3 Vital Signs Time Taken: 14:35 Temperature (F): 98.3 Height ([name]): 65 Pulse (bpm): 79 Weight (lbs): 118 Respiratory Rate (breaths/min): 16 Body Mass Index (BMI): 19.6 Blood Pressure (mmHg): 155/73 Reference Range: 80 - 120 mg / dl Electronic Signature(s) Signed: 04/04/2015 2:39:56 PM By: Regan Lemming BSN, RN Entered By: Regan Lemming on 04/04/2015 14:39:55

## 2015-04-07 NOTE — Progress Notes (Signed)
LUALLEN, Tiersa H. ([id number]) Visit Report for 04/04/2015 Chief Complaint Document Details Patient Name: [name], [name] Date of Service: 04/04/2015 2:30 PM Medical Record Number: [id number] Patient Account Number: [id number] Date of Birth/Sex: 1946-07-07 (69 y.o. Female) Treating RN: Primary Care Physician: Paulita Cradle Other Clinician: Referring Physician: Paulita Cradle Treating Physician/Extender: Frann Rider in Treatment: 3 Information Obtained from: Patient Chief Complaint Patient presents to the wound care center for a consult due non healing wound. Ulcers on the right elbow and the right heel for about 1 month. Electronic Signature(s) Signed: 04/04/2015 3:00:26 PM By: Christin Fudge MD, FACS Entered By: Christin Fudge on 04/04/2015 15:00:26 [name], [name] ([id number]) -------------------------------------------------------------------------------- HPI Details Patient Name: [name], [name] Date of Service: 04/04/2015 2:30 PM Medical Record Number: [id number] Patient Account Number: [id number] Date of Birth/Sex: Jan 12, 1946 (69 y.o. Female) Treating RN: Primary Care Physician: Paulita Cradle Other Clinician: Referring Physician: Paulita Cradle Treating Physician/Extender: Frann Rider in Treatment: 3 History of Present Illness Location: Ulceration on the right heel and the right elbow. Quality: Patient reports experiencing a dull pain to affected area(s). Severity: Patient states wound (s) are getting better. Duration: Patient has had the wound for < 4 weeks prior to presenting for treatment Timing: Pain in wound is Intermittent (comes and goes Context: The wound appeared gradually over time Modifying Factors: Consults to this date include:Augmentin and Bactrim and also some heel protection with duoderm Associated Signs and Symptoms: Patient reports having difficulty standing for long periods. HPI Description: 69 year old female with history of  peripheral neuropathy, history of diet controlled diabetes mellitus type 2, history of alcoholism here for wound consult sent by her PCP Dr. Sherilyn Cooter. She has pressure ulcers at her right elbow, bilateral heels. Plain films of right calcaneus without acute bony process. Patient started by PCP on Augmentin, Bactrim as per orders, DuoDerm dressings applied - reports some improvement in her ulcer since last seen. Denies fever, chills, nausea, vomiting, diarrhea. She had a right humerus fracture in the middle of May and has had no surgery and arm is in a sling. She is also been laying in the bed for quite a while. Past medical history significant for essential hypertension, osteoporosis, peripheral neuropathy, alcoholism, ataxia, personal history of breast cancer treated with surgery chemotherapy and radiation and this was done in December 2010. she is also status post laparoscopic cholecystectomy, pilonidal cyst excision, subcutaneous port placement, partial mastectomy on the left side, skin cancer removal. 03/21/2015 -- she says overall she's been doing better and continues to smoke about 15 cigarettes a day. 03/21/2015 - her orthopedic doctor has said she may require surgery for her right humerus fracture. 04/04/2015 -- her orthopedic surgery has been scheduled for August 11. Electronic Signature(s) Signed: 04/04/2015 3:00:49 PM By: Christin Fudge MD, FACS Entered By: Christin Fudge on 04/04/2015 15:00:48 [name], [name] ([id number]) -------------------------------------------------------------------------------- Physical Exam Details Patient Name: Whittier, Chrysta H. Date of Service: 04/04/2015 2:30 PM Medical Record Number: [id number] Patient Account Number: [id number] Date of Birth/Sex: 09/29/1945 (69 y.o. Female) Treating RN: Primary Care Physician: Paulita Cradle Other Clinician: Referring Physician: Paulita Cradle Treating Physician/Extender: Frann Rider in Treatment:  3 Constitutional . Pulse regular. Respirations normal and unlabored. Afebrile. . Eyes Nonicteric. Reactive to light. Ears, Nose, Mouth, and Throat Lips, teeth, and gums WNL.Marland Kitchen Moist mucosa without lesions . Neck supple and nontender. No palpable supraclavicular or cervical adenopathy. Normal sized without goiter. Respiratory WNL. No retractions.. Cardiovascular Pedal Pulses WNL. No clubbing, cyanosis or edema. Lymphatic No adneopathy. No  adenopathy. No adenopathy. Musculoskeletal Adexa without tenderness or enlargement.. Digits and nails w/o clubbing, cyanosis, infection, petechiae, ischemia, or inflammatory conditions.. Integumentary (Hair, Skin) No suspicious lesions. No crepitus or fluctuance. No peri-wound warmth or erythema. No masses.Marland Kitchen Psychiatric Judgement and insight Intact.. No evidence of depression, anxiety, or agitation.. Notes The right elbow is now covered with eschar again but we will continue to use silver alginate. The right heel is looking very clean and we will use Prisma AG Electronic Signature(s) Signed: 04/04/2015 3:01:44 PM By: Christin Fudge MD, FACS Entered By: Christin Fudge on 04/04/2015 15:01:43 [name], [name] ([id number]) -------------------------------------------------------------------------------- Physician Orders Details Patient Name: [name], Nyjai H. Date of Service: 04/04/2015 2:30 PM Medical Record Number: [id number] Patient Account Number: [id number] Date of Birth/Sex: Feb 21, 1946 (69 y.o. Female) Treating RN: Cornell Barman Primary Care Physician: Paulita Cradle Other Clinician: Referring Physician: Paulita Cradle Treating Physician/Extender: Frann Rider in Treatment: 3 Verbal / Phone Orders: Yes Clinician: Cornell Barman Read Back and Verified: Yes Diagnosis Coding Wound Cleansing Wound #1 Right Calcaneous o Cleanse wound with mild soap and water o May Shower, gently pat wound dry prior to applying new dressing. o May  shower with protection. Wound #2 Right Elbow o Cleanse wound with mild soap and water o May Shower, gently pat wound dry prior to applying new dressing. o May shower with protection. Primary Wound Dressing Wound #1 Right Calcaneous o Prisma Ag Wound #2 Right Elbow o Aquacel Ag Secondary Dressing Wound #1 Right Calcaneous o Boardered Foam Dressing Wound #2 Right Elbow o Boardered Foam Dressing Dressing Change Frequency Wound #1 Right Calcaneous o Change dressing every other day. Wound #2 Right Elbow o Change dressing every other day. Follow-up Appointments Wound #1 Right Calcaneous o Return Appointment in 1 week. [name], [name] ([id number]) Wound #2 Right Elbow o Return Appointment in 1 week. Off-Loading Wound #1 Right Calcaneous o Heel suspension boot to: - sage boots Electronic Signature(s) Signed: 04/04/2015 5:03:22 PM By: Gretta Cool RN, BSN, Kim RN, BSN Signed: 04/07/2015 12:13:47 PM By: Christin Fudge MD, FACS Entered By: Gretta Cool RN, BSN, Kim on 04/04/2015 14:56:56 [place], Inessa H. ([id number]) -------------------------------------------------------------------------------- Problem List Details Patient Name: [name], [name] Date of Service: 04/04/2015 2:30 PM Medical Record Number: [id number] Patient Account Number: [id number] Date of Birth/Sex: 01/09/1946 (69 y.o. Female) Treating RN: Primary Care Physician: Paulita Cradle Other Clinician: Referring Physician: Paulita Cradle Treating Physician/Extender: Frann Rider in Treatment: 3 Active Problems ICD-10 Encounter Code Description Active Date Diagnosis E11.621 Type 2 diabetes mellitus with foot ulcer 03/13/2015 Yes L89.613 Pressure ulcer of right heel, stage 3 03/13/2015 Yes L89.013 Pressure ulcer of right elbow, stage 3 03/13/2015 Yes F17.218 Nicotine dependence, cigarettes, with other nicotine- 03/13/2015 Yes induced disorders Inactive Problems Resolved Problems Electronic  Signature(s) Signed: 04/04/2015 3:00:19 PM By: Christin Fudge MD, FACS Entered By: Christin Fudge on 04/04/2015 15:00:18 [place], Giada H. ([id number]) -------------------------------------------------------------------------------- Progress Note Details Patient Name: [name], [name] Date of Service: 04/04/2015 2:30 PM Medical Record Number: [id number] Patient Account Number: [id number] Date of Birth/Sex: 1946/04/07 (69 y.o. Female) Treating RN: Primary Care Physician: Paulita Cradle Other Clinician: Referring Physician: Paulita Cradle Treating Physician/Extender: Frann Rider in Treatment: 3 Subjective Chief Complaint Information obtained from Patient Patient presents to the wound care center for a consult due non healing wound. Ulcers on the right elbow and the right heel for about 1 month. History of Present Illness (HPI) The following HPI elements were documented for the patient's wound: Location: Ulceration on the right heel and the  right elbow. Quality: Patient reports experiencing a dull pain to affected area(s). Severity: Patient states wound (s) are getting better. Duration: Patient has had the wound for < 4 weeks prior to presenting for treatment Timing: Pain in wound is Intermittent (comes and goes Context: The wound appeared gradually over time Modifying Factors: Consults to this date include:Augmentin and Bactrim and also some heel protection with duoderm Associated Signs and Symptoms: Patient reports having difficulty standing for long periods. 69 year old female with history of peripheral neuropathy, history of diet controlled diabetes mellitus type 2, history of alcoholism here for wound consult sent by her PCP Dr. Sherilyn Cooter. She has pressure ulcers at her right elbow, bilateral heels. Plain films of right calcaneus without acute bony process. Patient started by PCP on Augmentin, Bactrim as per orders, DuoDerm dressings applied - reports some improvement in  her ulcer since last seen. Denies fever, chills, nausea, vomiting, diarrhea. She had a right humerus fracture in the middle of May and has had no surgery and arm is in a sling. She is also been laying in the bed for quite a while. Past medical history significant for essential hypertension, osteoporosis, peripheral neuropathy, alcoholism, ataxia, personal history of breast cancer treated with surgery chemotherapy and radiation and this was done in December 2010. she is also status post laparoscopic cholecystectomy, pilonidal cyst excision, subcutaneous port placement, partial mastectomy on the left side, skin cancer removal. 03/21/2015 -- she says overall she's been doing better and continues to smoke about 15 cigarettes a day. 03/21/2015 - her orthopedic doctor has said she may require surgery for her right humerus fracture. 04/04/2015 -- her orthopedic surgery has been scheduled for August 11. [name], [name] ([id number]) Objective Constitutional Pulse regular. Respirations normal and unlabored. Afebrile. Vitals Time Taken: 2:35 PM, Height: 65 in, Weight: 118 lbs, BMI: 19.6, Temperature: 98.3 F, Pulse: 79 bpm, Respiratory Rate: 16 breaths/min, Blood Pressure: 155/73 mmHg. Eyes Nonicteric. Reactive to light. Ears, Nose, Mouth, and Throat Lips, teeth, and gums WNL.Marland Kitchen Moist mucosa without lesions . Neck supple and nontender. No palpable supraclavicular or cervical adenopathy. Normal sized without goiter. Respiratory WNL. No retractions.. Cardiovascular Pedal Pulses WNL. No clubbing, cyanosis or edema. Lymphatic No adneopathy. No adenopathy. No adenopathy. Musculoskeletal Adexa without tenderness or enlargement.. Digits and nails w/o clubbing, cyanosis, infection, petechiae, ischemia, or inflammatory conditions.Marland Kitchen Psychiatric Judgement and insight Intact.. No evidence of depression, anxiety, or agitation.. General Notes: The right elbow is now covered with eschar again but we will  continue to use silver alginate. The right heel is looking very clean and we will use Prisma AG Integumentary (Hair, Skin) No suspicious lesions. No crepitus or fluctuance. No peri-wound warmth or erythema. No masses.. Wound #1 status is Open. Original cause of wound was Gradually Appeared. The wound is located on the Right Calcaneous. The wound measures 1cm length x 1cm width x 0.1cm depth; 0.785cm^2 area and 0.079cm^3 volume. The wound is limited to skin breakdown. There is no tunneling or undermining noted. There is a none present amount of drainage noted. The wound margin is distinct with the outline attached to the wound base. There is medium (34-66%) pink, pale granulation within the wound bed. There is a medium (34-66%) amount of necrotic tissue within the wound bed including Adherent Slough. The [name], [name] ([id number]) periwound skin appearance exhibited: Localized Edema, Moist. The periwound skin appearance did not exhibit: Callus, Crepitus, Excoriation, Fluctuance, Friable, Induration, Rash, Scarring, Dry/Scaly, Maceration, Atrophie Blanche, Cyanosis, Ecchymosis, Hemosiderin Staining, Mottled, Pallor, Rubor, Erythema. Periwound  temperature was noted as No Abnormality. The periwound has tenderness on palpation. Wound #2 status is Open. Original cause of wound was Gradually Appeared. The wound is located on the Right Elbow. The wound measures 2cm length x 1.4cm width x 0.1cm depth; 2.199cm^2 area and 0.22cm^3 volume. The wound is limited to skin breakdown. There is no tunneling or undermining noted. There is a medium amount of serosanguineous drainage noted. The wound margin is distinct with the outline attached to the wound base. There is no granulation within the wound bed. There is a large (67- 100%) amount of necrotic tissue within the wound bed including Eschar. The periwound skin appearance exhibited: Dry/Scaly. The periwound skin appearance did not exhibit: Callus, Crepitus,  Excoriation, Fluctuance, Friable, Induration, Localized Edema, Rash, Scarring, Maceration, Moist, Atrophie Blanche, Cyanosis, Ecchymosis, Hemosiderin Staining, Mottled, Pallor, Rubor, Erythema. Periwound temperature was noted as No Abnormality. Assessment Active Problems ICD-10 E11.621 - Type 2 diabetes mellitus with foot ulcer L89.613 - Pressure ulcer of right heel, stage 3 L89.013 - Pressure ulcer of right elbow, stage 3 F17.218 - Nicotine dependence, cigarettes, with other nicotine-induced disorders The right elbow is now covered with eschar again but we will continue to use silver alginate. The right heel is looking very clean and we will use Prisma AG She continues to smoke and I have asked her to work with the PCP to get some nicotine patches and possibly get her some anti-anxiety medication as she seems very anxious and uses that as a excuse to smoke. Plan Wound Cleansing: Wound #1 Right Calcaneous: Cleanse wound with mild soap and water May Shower, gently pat wound dry prior to applying new dressing. May shower with protection. [name], [name] ([id number]) Wound #2 Right Elbow: Cleanse wound with mild soap and water May Shower, gently pat wound dry prior to applying new dressing. May shower with protection. Primary Wound Dressing: Wound #1 Right Calcaneous: Prisma Ag Wound #2 Right Elbow: Aquacel Ag Secondary Dressing: Wound #1 Right Calcaneous: Boardered Foam Dressing Wound #2 Right Elbow: Boardered Foam Dressing Dressing Change Frequency: Wound #1 Right Calcaneous: Change dressing every other day. Wound #2 Right Elbow: Change dressing every other day. Follow-up Appointments: Wound #1 Right Calcaneous: Return Appointment in 1 week. Wound #2 Right Elbow: Return Appointment in 1 week. Off-Loading: Wound #1 Right Calcaneous: Heel suspension boot to: - sage boots The right elbow is now covered with eschar again but we will continue to use silver alginate. The  right heel is looking very clean and we will use Prisma AG She continues to smoke and I have asked her to work with the PCP to get some nicotine patches and possibly get her some anti-anxiety medication as she seems very anxious and uses that as a excuse to smoke. Electronic Signature(s) Signed: 04/04/2015 3:02:34 PM By: Christin Fudge MD, FACS Entered By: Christin Fudge on 04/04/2015 15:02:34 [name], [name] ([id number]) -------------------------------------------------------------------------------- SuperBill Details Patient Name: [name], Stepheni H. Date of Service: 04/04/2015 Medical Record Number: [id number] Patient Account Number: [id number] Date of Birth/Sex: 1946-02-06 (69 y.o. Female) Treating RN: Primary Care Physician: Paulita Cradle Other Clinician: Referring Physician: Paulita Cradle Treating Physician/Extender: Frann Rider in Treatment: 3 Diagnosis Coding ICD-10 Codes Code Description 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, stage 3 L89.013 Pressure ulcer of right elbow, stage 3 F17.218 Nicotine dependence, cigarettes, with other nicotine-induced disorders Facility Procedures CPT4 Code: [id number] Description: 99213 - WOUND CARE VISIT-LEV 3 EST PT Modifier: Quantity: 1 Physician Procedures  CPT4 Code: [id number] Description: 73958 - WC PHYS LEVEL 3 - EST PT ICD-10 Description Diagnosis 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, stage 3 L89.013 Pressure ulcer of right elbow, stage 3 Modifier: Quantity: 1 Electronic Signature(s) Signed: 04/04/2015 3:02:52 PM By: Christin Fudge MD, FACS Entered By: Christin Fudge on 04/04/2015 15:02:51

## 2015-04-17 ENCOUNTER — Encounter
Admission: RE | Admit: 2015-04-17 | Discharge: 2015-04-17 | Disposition: A | Discharge status: Home / Self Care | Payer: Medicare Other | Source: Ambulatory Visit | Attending: Surgery | Admitting: Surgery

## 2015-04-17 ENCOUNTER — Other Ambulatory Visit: Payer: Medicare Other

## 2015-04-17 DIAGNOSIS — Z01812 Encounter for preprocedural laboratory examination: Secondary | ICD-10-CM | POA: Insufficient documentation

## 2015-04-17 HISTORY — DX: Pressure ulcer of unspecified heel, unspecified stage: L89.609

## 2015-04-17 HISTORY — DX: Gastro-esophageal reflux disease without esophagitis: K21.9

## 2015-04-17 HISTORY — DX: Polyneuropathy, unspecified: G62.9

## 2015-04-17 HISTORY — DX: Pressure ulcer of unspecified elbow, unspecified stage: L89.009

## 2015-04-17 HISTORY — DX: Hypothyroidism, unspecified: E03.9

## 2015-04-17 LAB — BASIC METABOLIC PANEL
Anion gap: 9 (ref 5–15)
BUN: 8 mg/dL (ref 6–20)
CALCIUM: 9.7 mg/dL (ref 8.9–10.3)
CHLORIDE: 96 mmol/L — AB (ref 101–111)
CO2: 22 mmol/L (ref 22–32)
Creatinine, Ser: 0.44 mg/dL (ref 0.44–1.00)
GFR calc non Af Amer: >60 mL/min (ref >60)
GLUCOSE: 130 mg/dL — AB (ref 65–99)
Potassium: 4.1 mmol/L (ref 3.5–5.1)
Sodium: 127 mmol/L — ABNORMAL LOW (ref 135–145)

## 2015-04-17 LAB — TYPE AND SCREEN
ABO/RH(D): O POS
Antibody Screen: NEGATIVE

## 2015-04-17 NOTE — Pre-Procedure Instructions (Signed)
Called pharmacy regarding allergy alert to Ancef "OK to give Ancef" per [facility] pharmacist.

## 2015-04-17 NOTE — Patient Instructions (Signed)
  Your procedure is scheduled on: 8/11\16 Thurs Report to Day Surgery. To find out your arrival time please call [phone] between 1PM - 3PM on 04/23/15 Wed.  Remember: Instructions that are not followed completely may result in serious medical risk, up to and including death, or upon the discretion of your surgeon and anesthesiologist your surgery may need to be rescheduled.    _x__ 1. Do not eat food or drink liquids after midnight. No gum chewing or hard candies.     _x___ 2. No Alcohol for 24 hours before or after surgery.   ____ 3. Bring all medications with you on the day of surgery if instructed.    _x_ 4. Notify your doctor if there is any change in your medical condition     (cold, fever, infections).     Do not wear jewelry, make-up, hairpins, clips or nail polish.  Do not wear lotions, powders, or perfumes. You may wear deodorant.  Do not shave 48 hours prior to surgery. Men may shave face and neck.  Do not bring valuables to the hospital.    [facility] is not responsible for any belongings or valuables.               Contacts, dentures or bridgework may not be worn into surgery.  Leave your suitcase in the car. After surgery it may be brought to your room.  For patients admitted to the hospital, discharge time is determined by your                treatment team.   Patients discharged the day of surgery will not be allowed to drive home.   Please read over the following fact sheets that you were given:      _x__ Take these medicines the morning of surgery with A SIP OF WATER:    1.ALPRAZolam (XANAX) 0.25 MG tablet  2. gabapentin (NEURONTIN) 400 MG capsule  3. lansoprazole (PREVACID) 30 MG capsule  4.metoprolol succinate (TOPROL-XL) 100 MG 24 hr tablet  5.oxyCODONE-acetaminophen (ROXICET) 5-325 MG per tablet  6.  ____ Fleet Enema (as directed)   _x__ Use CHG Soap as directed  ____ Use inhalers on the day of surgery  ____ Stop metformin 2 days prior to  surgery    ____ Take 1/2 of usual insulin dose the night before surgery and none on the morning of surgery.   ____ Stop Coumadin/Plavix/aspirin on  __x__ Stop Anti-inflammatories on stop ibuprofen 1 week before surgery   ____ Stop supplements until after surgery.    ____ Bring C-Pap to the hospital.

## 2015-04-18 ENCOUNTER — Encounter: Payer: Medicare Other | Attending: Surgery | Admitting: Surgery

## 2015-04-18 DIAGNOSIS — E11621 Type 2 diabetes mellitus with foot ulcer: Secondary | ICD-10-CM | POA: Insufficient documentation

## 2015-04-18 DIAGNOSIS — F17218 Nicotine dependence, cigarettes, with other nicotine-induced disorders: Secondary | ICD-10-CM | POA: Insufficient documentation

## 2015-04-18 DIAGNOSIS — L89613 Pressure ulcer of right heel, stage 3: Secondary | ICD-10-CM | POA: Insufficient documentation

## 2015-04-18 DIAGNOSIS — L89013 Pressure ulcer of right elbow, stage 3: Secondary | ICD-10-CM | POA: Insufficient documentation

## 2015-04-18 DIAGNOSIS — I10 Essential (primary) hypertension: Secondary | ICD-10-CM | POA: Insufficient documentation

## 2015-04-18 LAB — ABO/RH: ABO/RH(D): O POS

## 2015-04-19 NOTE — Progress Notes (Addendum)
BIRKEL, Corynne H. ([id number]) Visit Report for 04/18/2015 Chief Complaint Document Details Patient Name: [name], [name] Date of Service: 04/18/2015 3:00 PM Medical Record Number: [id number] Patient Account Number: [id number] Date of Birth/Sex: 1946-07-10 (69 y.o. Female) Treating RN: Primary Care Physician: Paulita Cradle Other Clinician: Referring Physician: Paulita Cradle Treating Physician/Extender: Frann Rider in Treatment: 5 Information Obtained from: Patient Chief Complaint Patient presents to the wound care center for a consult due non healing wound. Ulcers on the right elbow and the right heel for about 1 month. Electronic Signature(s) Signed: 04/18/2015 3:53:47 PM By: Christin Fudge MD, FACS Entered By: Christin Fudge on 04/18/2015 15:53:47 Bungert, Tayanna H. ([id number]) -------------------------------------------------------------------------------- HPI Details Patient Name: [name], [name] Date of Service: 04/18/2015 3:00 PM Medical Record Number: [id number] Patient Account Number: [id number] Date of Birth/Sex: Aug 09, 1946 (69 y.o. Female) Treating RN: Primary Care Physician: Paulita Cradle Other Clinician: Referring Physician: Paulita Cradle Treating Physician/Extender: Frann Rider in Treatment: 5 History of Present Illness Location: Ulceration on the right heel and the right elbow. Quality: Patient reports experiencing a dull pain to affected area(s). Severity: Patient states wound (s) are getting better. Duration: Patient has had the wound for < 4 weeks prior to presenting for treatment Timing: Pain in wound is Intermittent (comes and goes Context: The wound appeared gradually over time Modifying Factors: Consults to this date include:Augmentin and Bactrim and also some heel protection with duoderm Associated Signs and Symptoms: Patient reports having difficulty standing for long periods. HPI Description: 69 year old female with history of  peripheral neuropathy, history of diet controlled diabetes mellitus type 2, history of alcoholism here for wound consult sent by her PCP Dr. Sherilyn Cooter. She has pressure ulcers at her right elbow, bilateral heels. Plain films of right calcaneus without acute bony process. Patient started by PCP on Augmentin, Bactrim as per orders, DuoDerm dressings applied - reports some improvement in her ulcer since last seen. Denies fever, chills, nausea, vomiting, diarrhea. She had a right humerus fracture in the middle of May and has had no surgery and arm is in a sling. She is also been laying in the bed for quite a while. Past medical history significant for essential hypertension, osteoporosis, peripheral neuropathy, alcoholism, ataxia, personal history of breast cancer treated with surgery chemotherapy and radiation and this was done in December 2010. she is also status post laparoscopic cholecystectomy, pilonidal cyst excision, subcutaneous port placement, partial mastectomy on the left side, skin cancer removal. 03/21/2015 -- she says overall she's been doing better and continues to smoke about 15 cigarettes a day. 03/21/2015 - her orthopedic doctor has said she may require surgery for her right humerus fracture. 04/04/2015 -- her orthopedic surgery has been scheduled for August 11. 04/18/2015 -- she is doing fine as far as her elbow and her right heel goes but she has developed some redness over prominence on her thoracic spine and wanted me to take a look at this. Electronic Signature(s) Signed: 04/18/2015 3:54:15 PM By: Christin Fudge MD, FACS Entered By: Christin Fudge on 04/18/2015 15:54:15 Turkington, Renny H. ([id number]) -------------------------------------------------------------------------------- Physical Exam Details Patient Name: [name], [name] Date of Service: 04/18/2015 3:00 PM Medical Record Number: [id number] Patient Account Number: [id number] Date of Birth/Sex: 1945-12-03 (69 y.o.  Female) Treating RN: Primary Care Physician: Paulita Cradle Other Clinician: Referring Physician: Paulita Cradle Treating Physician/Extender: Frann Rider in Treatment: 5 Constitutional . Pulse regular. Respirations normal and unlabored. Afebrile. . Eyes Nonicteric. Reactive to light. Ears, Nose, Mouth, and Throat Lips, teeth, and gums  WNL.. Moist mucosa without lesions . Neck supple and nontender. No palpable supraclavicular or cervical adenopathy. Normal sized without goiter. Respiratory WNL. No retractions.. Breath sounds WNL, No rubs, rales, rhonchi, or wheeze.. Cardiovascular Heart rhythm and rate regular, no murmur or gallop.. Pedal Pulses WNL. No clubbing, cyanosis or edema. Chest Breasts symmetical and no nipple discharge.. Breast tissue WNL, no masses, lumps, or tenderness.. Lymphatic No adneopathy. No adenopathy. No adenopathy. Musculoskeletal Adexa without tenderness or enlargement.. Digits and nails w/o clubbing, cyanosis, infection, petechiae, ischemia, or inflammatory conditions.. Integumentary (Hair, Skin) No suspicious lesions. No crepitus or fluctuance. No peri-wound warmth or erythema. No masses.Marland Kitchen Psychiatric Judgement and insight Intact.. No evidence of depression, anxiety, or agitation.. Notes The right heel is looking very clean and we will continue with Prisma Ag. We will continue to use Betadine on the right elbow. As far as the back goes she has a little redness over the prominence on her thoracic spine but this is easily blanchable and there is no evidence of ulceration. Electronic Signature(s) Signed: 04/18/2015 3:55:35 PM By: Christin Fudge MD, FACS Entered By: Christin Fudge on 04/18/2015 15:55:35 Blankenbaker, Betrice H. ([id number]) -------------------------------------------------------------------------------- Physician Orders Details Patient Name: Mckenzie Fusi, [name] Date of Service: 04/18/2015 3:00 PM Medical Record Number: [id number] Patient  Account Number: [id number] Date of Birth/Sex: 11-07-1945 (69 y.o. Female) Treating RN: Cornell Barman Primary Care Physician: Paulita Cradle Other Clinician: Referring Physician: Paulita Cradle Treating Physician/Extender: Frann Rider in Treatment: 5 Verbal / Phone Orders: Yes Clinician: Cornell Barman Read Back and Verified: Yes Diagnosis Coding Wound Cleansing Wound #1 Right Calcaneous o Cleanse wound with mild soap and water o May Shower, gently pat wound dry prior to applying new dressing. o May shower with protection. Wound #2 Right Elbow o Cleanse wound with mild soap and water o May Shower, gently pat wound dry prior to applying new dressing. o May shower with protection. Primary Wound Dressing Wound #1 Right Calcaneous o Prisma Ag Wound #2 Right Elbow o Other: - betadine paint Secondary Dressing Wound #1 Right Calcaneous o Boardered Foam Dressing Wound #2 Right Elbow o Boardered Foam Dressing Dressing Change Frequency Wound #1 Right Calcaneous o Change dressing every other day. Wound #2 Right Elbow o Change dressing every other day. Follow-up Appointments Wound #1 Right Calcaneous o Return Appointment in 1 week. Delosreyes, Riely H. ([id number]) Wound #2 Right Elbow o Return Appointment in 1 week. Off-Loading Wound #1 Right Calcaneous o Heel suspension boot to: - sage boots Electronic Signature(s) Signed: 04/18/2015 3:57:29 PM By: Christin Fudge MD, FACS Signed: 04/18/2015 4:15:34 PM By: Gretta Cool RN, BSN, Kim RN, BSN Entered By: Gretta Cool, RN, BSN, Kim on 04/18/2015 15:27:22 Cobbs, Shemiah H. ([id number]) -------------------------------------------------------------------------------- Problem List Details Patient Name: Glaza, Yazmyn H. Date of Service: 04/18/2015 3:00 PM Medical Record Number: [id number] Patient Account Number: [id number] Date of Birth/Sex: 03/05/1946 (69 y.o. Female) Treating RN: Primary Care Physician: Paulita Cradle Other Clinician: Referring Physician: Paulita Cradle Treating Physician/Extender: Frann Rider in Treatment: 5 Active Problems ICD-10 Encounter Code Description Active Date Diagnosis E11.621 Type 2 diabetes mellitus with foot ulcer 03/13/2015 Yes L89.613 Pressure ulcer of right heel, stage 3 03/13/2015 Yes L89.013 Pressure ulcer of right elbow, stage 3 03/13/2015 Yes F17.218 Nicotine dependence, cigarettes, with other nicotine- 03/13/2015 Yes induced disorders Inactive Problems Resolved Problems Electronic Signature(s) Signed: 04/18/2015 3:53:39 PM By: Christin Fudge MD, FACS Entered By: Christin Fudge on 04/18/2015 15:53:39 Dack, Sanaiya H. ([id number]) -------------------------------------------------------------------------------- Progress Note Details Patient Name: Consiglio, Cacey H. Date of Service:  04/18/2015 3:00 PM Medical Record Number: [id number] Patient Account Number: [id number] Date of Birth/Sex: 08-Oct-1945 (69 y.o. Female) Treating RN: Primary Care Physician: Paulita Cradle Other Clinician: Referring Physician: Paulita Cradle Treating Physician/Extender: Frann Rider in Treatment: 5 Subjective Chief Complaint Information obtained from Patient Patient presents to the wound care center for a consult due non healing wound. Ulcers on the right elbow and the right heel for about 1 month. History of Present Illness (HPI) The following HPI elements were documented for the patient's wound: Location: Ulceration on the right heel and the right elbow. Quality: Patient reports experiencing a dull pain to affected area(s). Severity: Patient states wound (s) are getting better. Duration: Patient has had the wound for < 4 weeks prior to presenting for treatment Timing: Pain in wound is Intermittent (comes and goes Context: The wound appeared gradually over time Modifying Factors: Consults to this date include:Augmentin and Bactrim and also some heel  protection with duoderm Associated Signs and Symptoms: Patient reports having difficulty standing for long periods. 69 year old female with history of peripheral neuropathy, history of diet controlled diabetes mellitus type 2, history of alcoholism here for wound consult sent by her PCP Dr. Sherilyn Cooter. She has pressure ulcers at her right elbow, bilateral heels. Plain films of right calcaneus without acute bony process. Patient started by PCP on Augmentin, Bactrim as per orders, DuoDerm dressings applied - reports some improvement in her ulcer since last seen. Denies fever, chills, nausea, vomiting, diarrhea. She had a right humerus fracture in the middle of May and has had no surgery and arm is in a sling. She is also been laying in the bed for quite a while. Past medical history significant for essential hypertension, osteoporosis, peripheral neuropathy, alcoholism, ataxia, personal history of breast cancer treated with surgery chemotherapy and radiation and this was done in December 2010. she is also status post laparoscopic cholecystectomy, pilonidal cyst excision, subcutaneous port placement, partial mastectomy on the left side, skin cancer removal. 03/21/2015 -- she says overall she's been doing better and continues to smoke about 15 cigarettes a day. 03/21/2015 - her orthopedic doctor has said she may require surgery for her right humerus fracture. 04/04/2015 -- her orthopedic surgery has been scheduled for August 11. 04/18/2015 -- she is doing fine as far as her elbow and her right heel goes but she has developed some redness over prominence on her thoracic spine and wanted me to take a look at this. Feig, Orla H. ([id number]) Objective Constitutional Pulse regular. Respirations normal and unlabored. Afebrile. Vitals Time Taken: 3:06 PM, Height: 65 in, Weight: 118 lbs, BMI: 19.6, Temperature: 98.5 F, Pulse: 69 bpm, Respiratory Rate: 18 breaths/min, Blood Pressure: 149/98  mmHg. Eyes Nonicteric. Reactive to light. Ears, Nose, Mouth, and Throat Lips, teeth, and gums WNL.Marland Kitchen Moist mucosa without lesions . Neck supple and nontender. No palpable supraclavicular or cervical adenopathy. Normal sized without goiter. Respiratory WNL. No retractions.. Breath sounds WNL, No rubs, rales, rhonchi, or wheeze.. Cardiovascular Heart rhythm and rate regular, no murmur or gallop.. Pedal Pulses WNL. No clubbing, cyanosis or edema. Chest Breasts symmetical and no nipple discharge.. Breast tissue WNL, no masses, lumps, or tenderness.. Lymphatic No adneopathy. No adenopathy. No adenopathy. Musculoskeletal Adexa without tenderness or enlargement.. Digits and nails w/o clubbing, cyanosis, infection, petechiae, ischemia, or inflammatory conditions.Marland Kitchen Psychiatric Judgement and insight Intact.. No evidence of depression, anxiety, or agitation.. General Notes: The right heel is looking very clean and we will continue with Prisma Ag. We will continue to use Betadine  on the right elbow. As far as the back goes she has a little redness over the prominence on her thoracic spine but this is easily blanchable and there is no evidence of ulceration. Integumentary (Hair, Skin) No suspicious lesions. No crepitus or fluctuance. No peri-wound warmth or erythema. No masses.Marland Kitchen Sotero, Zenna H. ([id number]) Wound #1 status is Open. Original cause of wound was Gradually Appeared. The wound is located on the Right Calcaneous. The wound measures 1cm length x 1cm width x 0.1cm depth; 0.785cm^2 area and 0.079cm^3 volume. The wound is limited to skin breakdown. There is no tunneling noted. There is a none present amount of drainage noted. The wound margin is distinct with the outline attached to the wound base. There is medium (34-66%) pink, pale granulation within the wound bed. There is a medium (34-66%) amount of necrotic tissue within the wound bed including Adherent Slough. The periwound skin  appearance exhibited: Localized Edema, Moist. The periwound skin appearance did not exhibit: Callus, Crepitus, Excoriation, Fluctuance, Friable, Induration, Rash, Scarring, Dry/Scaly, Maceration, Atrophie Blanche, Cyanosis, Ecchymosis, Hemosiderin Staining, Mottled, Pallor, Rubor, Erythema. Periwound temperature was noted as No Abnormality. The periwound has tenderness on palpation. Wound #2 status is Open. Original cause of wound was Gradually Appeared. The wound is located on the Right Elbow. The wound measures 1.2cm length x 0.9cm width x 0.1cm depth; 0.848cm^2 area and 0.085cm^3 volume. The wound is limited to skin breakdown. There is a medium amount of serosanguineous drainage noted. The wound margin is distinct with the outline attached to the wound base. There is no granulation within the wound bed. There is a large (67-100%) amount of necrotic tissue within the wound bed including Eschar. The periwound skin appearance exhibited: Dry/Scaly. The periwound skin appearance did not exhibit: Callus, Crepitus, Excoriation, Fluctuance, Friable, Induration, Localized Edema, Rash, Scarring, Maceration, Moist, Atrophie Blanche, Cyanosis, Ecchymosis, Hemosiderin Staining, Mottled, Pallor, Rubor, Erythema. Periwound temperature was noted as No Abnormality. Other Condition(s) Patient presents with Suspected Deep Tissue Injury located on the Back. The skin appearance exhibited: Dry/Scaly. The skin appearance did not exhibit: Atrophie Blanche, Callus, Crepitus, Cyanosis, Ecchymosis, Erythema, Excoriation, Fluctuance, Friable, Hemosiderin Staining, Induration, Localized Edema, Maceration, Moist, Mottled, Pallor, Rash, Rubor, Scarring. General Notes: Patient has redness on a pressure point over the bony prominence of her spine. Bordered foam dressing applied for protection. Discussed sitting in different positions to remove pressure to this area. Assessment Active Problems ICD-10 E11.621 - Type 2  diabetes mellitus with foot ulcer L89.613 - Pressure ulcer of right heel, stage 3 L89.013 - Pressure ulcer of right elbow, stage 3 F17.218 - Nicotine dependence, cigarettes, with other nicotine-induced disorders I have recommended continuing with Prisma Ag on the heel and betadine on the elbow. As far as the back goes we will use some powdered form dressing to protect this area and offloading has been stressed. Kliethermes, Gabrelle H. ([id number]) She will be having her orthopedic surgery next week and will not see as still she is discharged from their care. Plan Wound Cleansing: Wound #1 Right Calcaneous: Cleanse wound with mild soap and water May Shower, gently pat wound dry prior to applying new dressing. May shower with protection. Wound #2 Right Elbow: Cleanse wound with mild soap and water May Shower, gently pat wound dry prior to applying new dressing. May shower with protection. Primary Wound Dressing: Wound #1 Right Calcaneous: Prisma Ag Wound #2 Right Elbow: Other: - betadine paint Secondary Dressing: Wound #1 Right Calcaneous: Boardered Foam Dressing Wound #2 Right Elbow: Boardered Foam  Dressing Dressing Change Frequency: Wound #1 Right Calcaneous: Change dressing every other day. Wound #2 Right Elbow: Change dressing every other day. Follow-up Appointments: Wound #1 Right Calcaneous: Return Appointment in 1 week. Wound #2 Right Elbow: Return Appointment in 1 week. Off-Loading: Wound #1 Right Calcaneous: Heel suspension boot to: - sage boots I have recommended continuing with Prisma Ag on the heel and betadine on the elbow. As far as the back goes we will use some powdered form dressing to protect this area and offloading has been stressed. She will be having her orthopedic surgery next week and will not see as still she is discharged from their care. Mckenzie Fusi, Shasta H. ([id number]) Electronic Signature(s) Signed: 04/22/2015 4:14:18 PM By: Christin Fudge MD, FACS Previous  Signature: 04/18/2015 3:56:38 PM Version By: Christin Fudge MD, FACS Entered By: Christin Fudge on 04/22/2015 16:14:18 Pang, Narda H. ([id number]) -------------------------------------------------------------------------------- SuperBill Details Patient Name: Mckenzie Fusi, Corisa H. Date of Service: 04/18/2015 Medical Record Number: [id number] Patient Account Number: [id number] Date of Birth/Sex: [birth year] (69 y.o. Female) Treating RN: Primary Care Physician: Paulita Cradle Other Clinician: Referring Physician: Paulita Cradle Treating Physician/Extender: Frann Rider in Treatment: 5 Diagnosis Coding ICD-10 Codes Code Description 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, stage 3 L89.013 Pressure ulcer of right elbow, stage 3 F17.218 Nicotine dependence, cigarettes, with other nicotine-induced disorders Facility Procedures CPT4 Code: [id number] Description: 99213 - WOUND CARE VISIT-LEV 3 EST PT Modifier: Quantity: 1 Physician Procedures CPT4 Code Description: [id number] 02409 - WC PHYS LEVEL 3 - EST PT ICD-10 Description Diagnosis 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, stage 3 L89.013 Pressure ulcer of right elbow, stage 3 F17.218 Nicotine  dependence, cigarettes, with other nicot Modifier: ine-induced di Quantity: 1 sorders Electronic Signature(s) Signed: 04/18/2015 3:57:19 PM By: Christin Fudge MD, FACS Entered By: Christin Fudge on 04/18/2015 15:57:19

## 2015-04-19 NOTE — Progress Notes (Signed)
[name], [name] ([id number]) Visit Report for 04/18/2015 Arrival Information Details Patient Name: [name], [name] Date of Service: 04/18/2015 3:00 PM Medical Record Number: [id number] Patient Account Number: [id number] Date of Birth/Sex: 13-Feb-1946 (69 y.o. Female) Treating RN: Cornell Barman Primary Care Physician: Paulita Cradle Other Clinician: Referring Physician: Paulita Cradle Treating Physician/Extender: Frann Rider in Treatment: 5 Visit Information History Since Last Visit Added or deleted any medications: No Patient Arrived: Wheel Chair Any new allergies or adverse reactions: No Arrival Time: 15:04 Had a fall or experienced change in No activities of daily living that may affect Accompanied By: husband risk of falls: Transfer Assistance: None Signs or symptoms of abuse/neglect since last No Patient Identification Verified: Yes visito Secondary Verification Process Yes Hospitalized since last visit: No Completed: Has Dressing in Place as Prescribed: Yes Patient Requires Transmission-Based No Pain Present Now: Yes Precautions: Patient Has Alerts: No Electronic Signature(s) Signed: 04/18/2015 4:15:34 PM By: Gretta Cool, RN, BSN, Kim RN, BSN Entered By: Gretta Cool, RN, BSN, Kim on 04/18/2015 15:06:40 [name], [name] ([id number]) -------------------------------------------------------------------------------- Clinic Level of Care Assessment Details Patient Name: [name], [name] Date of Service: 04/18/2015 3:00 PM Medical Record Number: [id number] Patient Account Number: [id number] Date of Birth/Sex: [birth year] (69 y.o. Female) Treating RN: Cornell Barman Primary Care Physician: Paulita Cradle Other Clinician: Referring Physician: Paulita Cradle Treating Physician/Extender: Frann Rider in Treatment: 5 Clinic Level of Care Assessment Items TOOL 4 Quantity Score []  - Use when only an EandM is performed on FOLLOW-UP visit 0 ASSESSMENTS - Nursing Assessment /  Reassessment []  - Reassessment of Co-morbidities (includes updates in patient status) 0 X - Reassessment of Adherence to Treatment Plan 1 5 ASSESSMENTS - Wound and Skin Assessment / Reassessment []  - Simple Wound Assessment / Reassessment - one wound 0 X - Complex Wound Assessment / Reassessment - multiple wounds 1 5 []  - Dermatologic / Skin Assessment (not related to wound area) 0 ASSESSMENTS - Focused Assessment []  - Circumferential Edema Measurements - multi extremities 0 []  - Nutritional Assessment / Counseling / Intervention 0 []  - Lower Extremity Assessment (monofilament, tuning fork, pulses) 0 []  - Peripheral Arterial Disease Assessment (using hand held doppler) 0 ASSESSMENTS - Ostomy and/or Continence Assessment and Care []  - Incontinence Assessment and Management 0 []  - Ostomy Care Assessment and Management (repouching, etc.) 0 PROCESS - Coordination of Care []  - Simple Patient / Family Education for ongoing care 0 X - Complex (extensive) Patient / Family Education for ongoing care 1 20 []  - Staff obtains Programmer, systems, Records, Test Results / Process Orders 0 []  - Staff telephones HHA, Nursing Homes / Clarify orders / etc 0 []  - Routine Transfer to another Facility (non-emergent condition) 0 [name], [name] ([id number]) []  - Routine Hospital Admission (non-emergent condition) 0 X - New Admissions / Biomedical engineer / Ordering NPWT, Apligraf, etc. 1 15 []  - Emergency Hospital Admission (emergent condition) 0 []  - Simple Discharge Coordination 0 X - Complex (extensive) Discharge Coordination 1 15 PROCESS - Special Needs []  - Pediatric / Minor Patient Management 0 []  - Isolation Patient Management 0 []  - Hearing / Language / Visual special needs 0 []  - Assessment of Community assistance (transportation, D/C planning, etc.) 0 []  - Additional assistance / Altered mentation 0 []  - Support Surface(s) Assessment (bed, cushion, seat, etc.) 0 INTERVENTIONS - Wound Cleansing /  Measurement []  - Simple Wound Cleansing - one wound 0 X - Complex Wound Cleansing - multiple wounds 2 5 X - Wound Imaging (photographs - any number  of wounds) 1 5 []  - Wound Tracing (instead of photographs) 0 []  - Simple Wound Measurement - one wound 0 X - Complex Wound Measurement - multiple wounds 2 5 INTERVENTIONS - Wound Dressings X - Small Wound Dressing one or multiple wounds 2 10 []  - Medium Wound Dressing one or multiple wounds 0 []  - Large Wound Dressing one or multiple wounds 0 []  - Application of Medications - topical 0 []  - Application of Medications - injection 0 INTERVENTIONS - Miscellaneous []  - External ear exam 0 [name], [name] ([id number]) []  - Specimen Collection (cultures, biopsies, blood, body fluids, etc.) 0 []  - Specimen(s) / Culture(s) sent or taken to Lab for analysis 0 []  - Patient Transfer (multiple staff / Civil Service fast streamer / Similar devices) 0 []  - Simple Staple / Suture removal (25 or less) 0 []  - Complex Staple / Suture removal (26 or more) 0 []  - Hypo / Hyperglycemic Management (close monitor of Blood Glucose) 0 []  - Ankle / Brachial Index (ABI) - do not check if billed separately 0 X - Vital Signs 1 5 Has the patient been seen at the hospital within the last three years: Yes Total Score: 110 Level Of Care: New/Established - Level 3 Electronic Signature(s) Signed: 04/18/2015 4:15:34 PM By: Gretta Cool, RN, BSN, Kim RN, BSN Entered By: Gretta Cool, RN, BSN, Kim on 04/18/2015 15:28:58 [name], [name] ([id number]) -------------------------------------------------------------------------------- Encounter Discharge Information Details Patient Name: [name], [name] Date of Service: 04/18/2015 3:00 PM Medical Record Number: [id number] Patient Account Number: [id number] Date of Birth/Sex: 23-Dec-1945 (69 y.o. Female) Treating RN: Primary Care Physician: Paulita Cradle Other Clinician: Referring Physician: Paulita Cradle Treating Physician/Extender: Frann Rider  in Treatment: 5 Encounter Discharge Information Items Schedule Follow-up Appointment: No Medication Reconciliation completed No and provided to Patient/Care Mckenzie Righter: Provided on Clinical Summary of Care: 04/18/2015 Form Type Recipient Paper Patient AS Electronic Signature(s) Signed: 04/18/2015 3:41:33 PM By: Ruthine Dose Entered By: Ruthine Dose on 04/18/2015 15:41:33 [name], [name] ([id number]) -------------------------------------------------------------------------------- Lower Extremity Assessment Details Patient Name: [name], [name] Date of Service: 04/18/2015 3:00 PM Medical Record Number: [id number] Patient Account Number: [id number] Date of Birth/Sex: 14-Jun-1946 (69 y.o. Female) Treating RN: Cornell Barman Primary Care Physician: Paulita Cradle Other Clinician: Referring Physician: Paulita Cradle Treating Physician/Extender: Frann Rider in Treatment: 5 Vascular Assessment Pulses: Posterior Tibial Dorsalis Pedis Palpable: [Right:Yes] Extremity colors, hair growth, and conditions: Extremity Color: [Right:Mottled] Hair Growth on Extremity: [Right:No] Temperature of Extremity: [Right:Cool] Capillary Refill: [Right:< 3 seconds] Toe Nail Assessment Left: Right: Thick: No Discolored: No Deformed: No Improper Length and Hygiene: No Electronic Signature(s) Signed: 04/18/2015 4:15:34 PM By: Gretta Cool, RN, BSN, Kim RN, BSN Entered By: Gretta Cool, RN, BSN, Kim on 04/18/2015 15:15:11 [name], [name] ([id number]) -------------------------------------------------------------------------------- Multi Wound Chart Details Patient Name: [name], [name] Date of Service: 04/18/2015 3:00 PM Medical Record Number: [id number] Patient Account Number: [id number] Date of Birth/Sex: [birth year] (69 y.o. Female) Treating RN: Cornell Barman Primary Care Physician: Paulita Cradle Other Clinician: Referring Physician: Paulita Cradle Treating Physician/Extender: Frann Rider in  Treatment: 5 Vital Signs Height(in): 65 Pulse(bpm): 69 Weight(lbs): 118 Blood Pressure 149/98 (mmHg): Body Mass Index(BMI): 20 Temperature(F): 98.5 Respiratory Rate 18 (breaths/min): Photos: [1:No Photos] [2:No Photos] [N/A:N/A] Wound Location: [1:Right Calcaneous] [2:Right Elbow] [N/A:N/A] Wounding Event: [1:Gradually Appeared] [2:Gradually Appeared] [N/A:N/A] Primary Etiology: [1:Pressure Ulcer] [2:Pressure Ulcer] [N/A:N/A] Comorbid History: [1:Cataracts, Asthma, Hypertension, Type II Diabetes, Neuropathy, Received Chemotherapy] [2:Cataracts, Asthma, Hypertension, Type II Diabetes, Neuropathy, Received Chemotherapy] [N/A:N/A] Date Acquired: [1:02/25/2015] [2:02/25/2015] [N/A:N/A] Weeks of  Treatment: [1:5] [2:5] [N/A:N/A] Wound Status: [1:Open] [2:Open] [N/A:N/A] Measurements L x W x D 1x1x0.1 [2:1.2x0.9x0.1] [N/A:N/A] (cm) Area (cm) : [1:0.785] [2:0.848] [N/A:N/A] Volume (cm) : [1:0.079] [2:0.085] [N/A:N/A] % Reduction in Area: [1:86.70%] [2:55.00%] [N/A:N/A] % Reduction in Volume: 93.30% [2:77.50%] [N/A:N/A] Classification: [1:Category/Stage III] [2:Category/Stage III] [N/A:N/A] HBO Classification: [1:Grade 1] [2:N/A] [N/A:N/A] Exudate Amount: [1:None Present] [2:Medium] [N/A:N/A] Exudate Type: [1:N/A] [2:Serosanguineous] [N/A:N/A] Exudate Color: [1:N/A] [2:red, brown] [N/A:N/A] Wound Margin: [1:Distinct, outline attached] [2:Distinct, outline attached] [N/A:N/A] Granulation Amount: [1:Medium (34-66%)] [2:None Present (0%)] [N/A:N/A] Granulation Quality: [1:Pink, Pale] [2:N/A] [N/A:N/A] Necrotic Amount: [1:Medium (34-66%)] [2:Large (67-100%)] [N/A:N/A] Necrotic Tissue: [1:Adherent Slough] [2:Eschar] [N/A:N/A] Exposed Structures: [1:Fascia: No Fat: No Tendon: No] [2:Fascia: No Fat: No Tendon: No] [N/A:N/A] Muscle: No Muscle: No Joint: No Joint: No Bone: No Bone: No Limited to Skin Limited to Skin Breakdown Breakdown Epithelialization: Small (1-33%) Small (1-33%)  N/A Periwound Skin Texture: Edema: Yes Edema: No N/A Excoriation: No Excoriation: No Induration: No Induration: No Callus: No Callus: No Crepitus: No Crepitus: No Fluctuance: No Fluctuance: No Friable: No Friable: No Rash: No Rash: No Scarring: No Scarring: No Periwound Skin Moist: Yes Dry/Scaly: Yes N/A Moisture: Maceration: No Maceration: No Dry/Scaly: No Moist: No Periwound Skin Color: Atrophie Blanche: No Atrophie Blanche: No N/A Cyanosis: No Cyanosis: No Ecchymosis: No Ecchymosis: No Erythema: No Erythema: No Hemosiderin Staining: No Hemosiderin Staining: No Mottled: No Mottled: No Pallor: No Pallor: No Rubor: No Rubor: No Temperature: No Abnormality No Abnormality N/A Tenderness on Yes No N/A Palpation: Wound Preparation: Ulcer Cleansing: Ulcer Cleansing: N/A Rinsed/Irrigated with Rinsed/Irrigated with Saline Saline Topical Anesthetic Topical Anesthetic Applied: Other: lidocaine Applied: Other: lidocaine 4% 4% Treatment Notes Electronic Signature(s) Signed: 04/18/2015 4:15:34 PM By: Gretta Cool, RN, BSN, Kim RN, BSN Entered By: Gretta Cool, RN, BSN, Kim on 04/18/2015 15:18:38 [name], Mckenzie Lemmie Evens ([id number]) -------------------------------------------------------------------------------- Multi-Disciplinary Care Plan Details Patient Name: [name], [name] Date of Service: 04/18/2015 3:00 PM Medical Record Number: [id number] Patient Account Number: [id number] Date of Birth/Sex: 04/24/1946 (69 y.o. Female) Treating RN: Cornell Barman Primary Care Physician: Paulita Cradle Other Clinician: Referring Physician: Paulita Cradle Treating Physician/Extender: Frann Rider in Treatment: 5 Active Inactive Abuse / Safety / Falls / Self Care Management Nursing Diagnoses: Impaired home maintenance Impaired physical mobility Knowledge deficit related to: safety; personal, health (wound), emergency Potential for falls Self care deficit: actual or  potential Goals: Patient will remain injury free Date Initiated: 03/13/2015 Goal Status: Active Patient/caregiver will verbalize understanding of skin care regimen Date Initiated: 03/13/2015 Goal Status: Active Patient/caregiver will verbalize/demonstrate measure taken to improve self care Date Initiated: 03/13/2015 Goal Status: Active Patient/caregiver will verbalize/demonstrate measures taken to improve the patient's personal safety Date Initiated: 03/13/2015 Goal Status: Active Patient/caregiver will verbalize/demonstrate measures taken to prevent injury and/or falls Date Initiated: 03/13/2015 Goal Status: Active Patient/caregiver will verbalize/demonstrate understanding of what to do in case of emergency Date Initiated: 03/13/2015 Goal Status: Active Interventions: Assess fall risk on admission and as needed Assess: immobility, friction, shearing, incontinence upon admission and as needed Assess impairment of mobility on admission and as needed per policy Assess self care needs on admission and as needed Provide education on basic hygiene [place], [place]. ([id number]) Provide education on fall prevention Provide education on personal and home safety Provide education on safe transfers Treatment Activities: Education provided on Basic Hygiene : 03/13/2015 Notes: Orientation to the Wound Care Program Nursing Diagnoses: Knowledge deficit related to the wound healing center program Goals: Patient/caregiver will verbalize understanding of the [facility]  Date Initiated: 03/13/2015 Goal Status: Active Interventions: Provide education on orientation to the wound center Notes: Pressure Nursing Diagnoses: Knowledge deficit related to causes and risk factors for pressure ulcer development Knowledge deficit related to management of pressures ulcers Potential for impaired tissue integrity related to pressure, friction, moisture, and shear Goals: Patient will remain  free from development of additional pressure ulcers Date Initiated: 03/13/2015 Goal Status: Active Patient will remain free of pressure ulcers Date Initiated: 03/13/2015 Goal Status: Active Patient/caregiver will verbalize risk factors for pressure ulcer development Date Initiated: 03/13/2015 Goal Status: Active Patient/caregiver will verbalize understanding of pressure ulcer management Date Initiated: 03/13/2015 Goal Status: Active Interventions: Assess: immobility, friction, shearing, incontinence upon admission and as needed [name], [name] ([id number]) Assess offloading mechanisms upon admission and as needed Assess potential for pressure ulcer upon admission and as needed Provide education on pressure ulcers Treatment Activities: Patient referred for home evaluation of offloading devices/mattresses : 04/18/2015 Patient referred for pressure reduction/relief devices : 04/18/2015 Patient referred for seating evaluation to ensure proper offloading : 04/18/2015 Pressure reduction/relief device ordered : 04/18/2015 Test ordered outside of clinic : 04/18/2015 Notes: Wound/Skin Impairment Nursing Diagnoses: Impaired tissue integrity Knowledge deficit related to ulceration/compromised skin integrity Goals: Patient/caregiver will verbalize understanding of skin care regimen Date Initiated: 03/13/2015 Goal Status: Active Ulcer/skin breakdown will have a volume reduction of 30% by week 4 Date Initiated: 03/13/2015 Goal Status: Active Ulcer/skin breakdown will have a volume reduction of 50% by week 8 Date Initiated: 03/13/2015 Goal Status: Active Ulcer/skin breakdown will have a volume reduction of 80% by week 12 Date Initiated: 03/13/2015 Goal Status: Active Ulcer/skin breakdown will heal within 14 weeks Date Initiated: 03/13/2015 Goal Status: Active Interventions: Assess patient/caregiver ability to obtain necessary supplies Assess patient/caregiver ability to perform ulcer/skin care regimen  upon admission and as needed Assess ulceration(s) every visit Provide education on smoking Provide education on ulcer and skin care Treatment Activities: Patient referred to home care : 04/18/2015 [place]. ([id number]) Referred to DME Syana Degraffenreid for dressing supplies : 04/18/2015 Skin care regimen initiated : 04/18/2015 Topical wound management initiated : 04/18/2015 Notes: Electronic Signature(s) Signed: 04/18/2015 4:15:34 PM By: Gretta Cool, RN, BSN, Kim RN, BSN Entered By: Gretta Cool, RN, BSN, Kim on 04/18/2015 15:18:31 [place], [place]. ([id number]) -------------------------------------------------------------------------------- Non-Wound Condition Assessment Details Patient Name: Ripple, Dlynn H. Date of Service: 04/18/2015 3:00 PM Medical Record Number: [id number] Patient Account Number: [id number] Date of Birth/Sex: Nov 18, 1945 (69 y.o. Female) Treating RN: Cornell Barman Primary Care Physician: Paulita Cradle Other Clinician: Referring Physician: Paulita Cradle Treating Physician/Extender: Frann Rider in Treatment: 5 Non-Wound Condition: Condition: Suspected Deep Tissue Injury Location: Back Side: Photos Periwound Skin Texture Texture Color No Abnormalities Noted: No No Abnormalities Noted: No Callus: No Atrophie Blanche: No Crepitus: No Cyanosis: No Excoriation: No Ecchymosis: No Fluctuance: No Erythema: No Friable: No Hemosiderin Staining: No Induration: No Mottled: No Localized Edema: No Pallor: No Rash: No Rubor: No Scarring: No Moisture No Abnormalities Noted: No Dry / Scaly: Yes Maceration: No Moist: No Notes Patient has redness on a pressure point over the bony prominence of her spine. Bordered foam dressing applied for protection. Discussed sitting in different positions to remove pressure to this area. Electronic Signature(s) RAHRIG, Arieanna H. ([id number]) Signed: 04/18/2015 4:41:50 PM By: Gretta Cool RN, BSN, Kim RN, BSN Entered By: Gretta Cool, RN, BSN, Kim on  04/18/2015 16:20:59 Carre, Breyon H. ([id number]) -------------------------------------------------------------------------------- Pain Assessment Details Patient Name: Mcclurg, Consepcion H. Date of Service: 04/18/2015 3:00 PM Medical Record Number: [id number] Patient Account  Number: [id number] Date of Birth/Sex: March 27, 1946 (69 y.o. Female) Treating RN: Cornell Barman Primary Care Physician: Paulita Cradle Other Clinician: Referring Physician: Paulita Cradle Treating Physician/Extender: Frann Rider in Treatment: 5 Active Problems Location of Pain Severity and Description of Pain Patient Has Paino Yes Site Locations Pain Location: Pain in Ulcers With Dressing Change: Yes Pain Management and Medication Current Pain Management: Electronic Signature(s) Signed: 04/18/2015 4:15:34 PM By: Gretta Cool, RN, BSN, Kim RN, BSN Entered By: Gretta Cool, RN, BSN, Kim on 04/18/2015 15:07:01 Hendriks, Gaylon H. ([id number]) -------------------------------------------------------------------------------- Patient/Caregiver Education Details Patient Name: [name], Ellese H. Date of Service: 04/18/2015 3:00 PM Medical Record Number: [id number] Patient Account Number: [id number] Date of Birth/Gender: [birth year] (69 y.o. Female) Treating RN: Cornell Barman Primary Care Physician: Paulita Cradle Other Clinician: Referring Physician: Paulita Cradle Treating Physician/Extender: Frann Rider in Treatment: 5 Education Assessment Education Provided To: Caregiver Education Topics Provided Wound/Skin Impairment: Handouts: Other: continue wound care as prescribed Electronic Signature(s) Signed: 04/18/2015 4:15:34 PM By: Gretta Cool, RN, BSN, Kim RN, BSN Entered By: Gretta Cool, RN, BSN, Kim on 04/18/2015 15:45:10 Glendenning, Semiyah H. ([id number]) -------------------------------------------------------------------------------- Wound Assessment Details Patient Name: Teater, Erinne H. Date of Service: 04/18/2015 3:00 PM Medical Record  Number: [id number] Patient Account Number: [id number] Date of Birth/Sex: 01-28-1946 (69 y.o. Female) Treating RN: Cornell Barman Primary Care Physician: Paulita Cradle Other Clinician: Referring Physician: Paulita Cradle Treating Physician/Extender: Frann Rider in Treatment: 5 Wound Status Wound Number: 1 Primary Pressure Ulcer Etiology: Wound Location: Right Calcaneous Wound Open Wounding Event: Gradually Appeared Status: Date Acquired: 02/25/2015 Comorbid Cataracts, Asthma, Hypertension, Type Weeks Of Treatment: 5 History: II Diabetes, Neuropathy, Received Clustered Wound: No Chemotherapy Wound Measurements Length: (cm) 1 Width: (cm) 1 Depth: (cm) 0.1 Area: (cm) 0.785 Volume: (cm) 0.079 % Reduction in Area: 86.7% % Reduction in Volume: 93.3% Epithelialization: Small (1-33%) Tunneling: No Wound Description Classification: Category/Stage III Foul O Diabetic Severity (Wagner): Grade 1 Wound Margin: Distinct, outline attached Exudate Amount: None Present dor After Cleansing: No Wound Bed Granulation Amount: Medium (34-66%) Exposed Structure Granulation Quality: Pink, Pale Fascia Exposed: No Necrotic Amount: Medium (34-66%) Fat Layer Exposed: No Necrotic Quality: Adherent Slough Tendon Exposed: No Muscle Exposed: No Joint Exposed: No Bone Exposed: No Limited to Skin Breakdown Periwound Skin Texture Texture Color No Abnormalities Noted: No No Abnormalities Noted: No Callus: No Atrophie Blanche: No Crepitus: No Cyanosis: No Excoriation: No Ecchymosis: No Fluctuance: No Erythema: No Shaver, Kaede H. ([id number]) Friable: No Hemosiderin Staining: No Induration: No Mottled: No Localized Edema: Yes Pallor: No Rash: No Rubor: No Scarring: No Temperature / Pain Moisture Temperature: No Abnormality No Abnormalities Noted: No Tenderness on Palpation: Yes Dry / Scaly: No Maceration: No Moist: Yes Wound Preparation Ulcer Cleansing:  Rinsed/Irrigated with Saline Topical Anesthetic Applied: Other: lidocaine 4%, Treatment Notes Wound #1 (Right Calcaneous) 1. Cleansed with: Clean wound with Normal Saline 2. Anesthetic Topical Lidocaine 4% cream to wound bed prior to debridement 4. Dressing Applied: Prisma Ag 5. Secondary Dressing Applied Bordered Foam Dressing Electronic Signature(s) Signed: 04/18/2015 4:15:34 PM By: Gretta Cool, RN, BSN, Kim RN, BSN Entered By: Gretta Cool, RN, BSN, Kim on 04/18/2015 15:11:56 Cappiello, Crosby H. ([id number]) -------------------------------------------------------------------------------- Wound Assessment Details Patient Name: Moccia, Christine H. Date of Service: 04/18/2015 3:00 PM Medical Record Number: [id number] Patient Account Number: [id number] Date of Birth/Sex: 11-Jun-1946 (69 y.o. Female) Treating RN: Cornell Barman Primary Care Physician: Paulita Cradle Other Clinician: Referring Physician: Paulita Cradle Treating Physician/Extender: Frann Rider in Treatment: 5 Wound Status Wound Number: 2 Primary Pressure Ulcer Etiology: Wound  Location: Right Elbow Wound Open Wounding Event: Gradually Appeared Status: Date Acquired: 02/25/2015 Comorbid Cataracts, Asthma, Hypertension, Type Weeks Of Treatment: 5 History: II Diabetes, Neuropathy, Received Clustered Wound: No Chemotherapy Wound Measurements Length: (cm) 1.2 Width: (cm) 0.9 Depth: (cm) 0.1 Area: (cm) 0.848 Volume: (cm) 0.085 % Reduction in Area: 55% % Reduction in Volume: 77.5% Epithelialization: Small (1-33%) Wound Description Classification: Category/Stage III Wound Margin: Distinct, outline attached Exudate Amount: Medium Exudate Type: Serosanguineous Exudate Color: red, brown Foul Odor After Cleansing: No Wound Bed Granulation Amount: None Present (0%) Exposed Structure Necrotic Amount: Large (67-100%) Fascia Exposed: No Necrotic Quality: Eschar Fat Layer Exposed: No Tendon Exposed: No Muscle Exposed:  No Joint Exposed: No Bone Exposed: No Limited to Skin Breakdown Periwound Skin Texture Texture Color No Abnormalities Noted: No No Abnormalities Noted: No Callus: No Atrophie Blanche: No Crepitus: No Cyanosis: No Excoriation: No Ecchymosis: No Arreola, Marciel H. ([id number]) Fluctuance: No Erythema: No Friable: No Hemosiderin Staining: No Induration: No Mottled: No Localized Edema: No Pallor: No Rash: No Rubor: No Scarring: No Temperature / Pain Moisture Temperature: No Abnormality No Abnormalities Noted: No Dry / Scaly: Yes Maceration: No Moist: No Wound Preparation Ulcer Cleansing: Rinsed/Irrigated with Saline Topical Anesthetic Applied: Other: lidocaine 4%, Treatment Notes Wound #2 (Right Elbow) 1. Cleansed with: Clean wound with Normal Saline 4. Dressing Applied: Other dressing (specify in notes) 5. Secondary Dressing Applied Bordered Foam Dressing Notes betadine paint Electronic Signature(s) Signed: 04/18/2015 4:15:34 PM By: Gretta Cool, RN, BSN, Kim RN, BSN Entered By: Gretta Cool, RN, BSN, Kim on 04/18/2015 15:14:13 Wrobel, Ariabella H. ([id number]) -------------------------------------------------------------------------------- Vitals Details Patient Name: [name], Liandra H. Date of Service: 04/18/2015 3:00 PM Medical Record Number: [id number] Patient Account Number: [id number] Date of Birth/Sex: Mar 09, 1946 (69 y.o. Female) Treating RN: Cornell Barman Primary Care Physician: Paulita Cradle Other Clinician: Referring Physician: Paulita Cradle Treating Physician/Extender: Frann Rider in Treatment: 5 Vital Signs Time Taken: 15:06 Temperature (F): 98.5 Height (in): 65 Pulse (bpm): 69 Weight (lbs): 118 Respiratory Rate (breaths/min): 18 Body Mass Index (BMI): 19.6 Blood Pressure (mmHg): 149/98 Reference Range: 80 - 120 mg / dl Electronic Signature(s) Signed: 04/18/2015 4:15:34 PM By: Gretta Cool, RN, BSN, Kim RN, BSN Entered By: Gretta Cool, RN, BSN, Kim on 04/18/2015  15:07:26

## 2015-04-24 ENCOUNTER — Encounter: Payer: Self-pay | Admitting: *Deleted

## 2015-04-24 ENCOUNTER — Ambulatory Visit: Payer: Medicare Other | Admitting: Anesthesiology

## 2015-04-24 ENCOUNTER — Encounter
Admission: AD | Disposition: A | Discharge status: Home / Self Care | Payer: Self-pay | Source: Ambulatory Visit | Attending: Surgery

## 2015-04-24 ENCOUNTER — Inpatient Hospital Stay
Admission: AD | Admit: 2015-04-24 | Discharge: 2015-04-26 | DRG: 494 | Disposition: A | Discharge status: Home / Self Care | Payer: Medicare Other | Source: Ambulatory Visit | Attending: Surgery | Admitting: Surgery

## 2015-04-24 ENCOUNTER — Ambulatory Visit: Payer: Medicare Other

## 2015-04-24 DIAGNOSIS — K219 Gastro-esophageal reflux disease without esophagitis: Secondary | ICD-10-CM | POA: Diagnosis present

## 2015-04-24 DIAGNOSIS — F17210 Nicotine dependence, cigarettes, uncomplicated: Secondary | ICD-10-CM | POA: Diagnosis present

## 2015-04-24 DIAGNOSIS — Z853 Personal history of malignant neoplasm of breast: Secondary | ICD-10-CM | POA: Diagnosis not present

## 2015-04-24 DIAGNOSIS — E039 Hypothyroidism, unspecified: Secondary | ICD-10-CM | POA: Diagnosis present

## 2015-04-24 DIAGNOSIS — S42211A Unspecified displaced fracture of surgical neck of right humerus, initial encounter for closed fracture: Secondary | ICD-10-CM | POA: Diagnosis present

## 2015-04-24 DIAGNOSIS — I10 Essential (primary) hypertension: Secondary | ICD-10-CM | POA: Diagnosis present

## 2015-04-24 DIAGNOSIS — Z803 Family history of malignant neoplasm of breast: Secondary | ICD-10-CM

## 2015-04-24 DIAGNOSIS — Z8249 Family history of ischemic heart disease and other diseases of the circulatory system: Secondary | ICD-10-CM

## 2015-04-24 DIAGNOSIS — Y92003 Bedroom of unspecified non-institutional (private) residence as the place of occurrence of the external cause: Secondary | ICD-10-CM | POA: Diagnosis not present

## 2015-04-24 DIAGNOSIS — E119 Type 2 diabetes mellitus without complications: Secondary | ICD-10-CM | POA: Diagnosis present

## 2015-04-24 DIAGNOSIS — Z85828 Personal history of other malignant neoplasm of skin: Secondary | ICD-10-CM | POA: Diagnosis not present

## 2015-04-24 DIAGNOSIS — Z419 Encounter for procedure for purposes other than remedying health state, unspecified: Secondary | ICD-10-CM

## 2015-04-24 DIAGNOSIS — W19XXXA Unspecified fall, initial encounter: Secondary | ICD-10-CM | POA: Diagnosis present

## 2015-04-24 DIAGNOSIS — Z9012 Acquired absence of left breast and nipple: Secondary | ICD-10-CM | POA: Diagnosis present

## 2015-04-24 DIAGNOSIS — Z79891 Long term (current) use of opiate analgesic: Secondary | ICD-10-CM

## 2015-04-24 DIAGNOSIS — Z79899 Other long term (current) drug therapy: Secondary | ICD-10-CM | POA: Diagnosis present

## 2015-04-24 DIAGNOSIS — S42209A Unspecified fracture of upper end of unspecified humerus, initial encounter for closed fracture: Secondary | ICD-10-CM | POA: Diagnosis present

## 2015-04-24 DIAGNOSIS — J45909 Unspecified asthma, uncomplicated: Secondary | ICD-10-CM | POA: Diagnosis present

## 2015-04-24 DIAGNOSIS — Z9049 Acquired absence of other specified parts of digestive tract: Secondary | ICD-10-CM | POA: Diagnosis present

## 2015-04-24 DIAGNOSIS — Z9889 Other specified postprocedural states: Secondary | ICD-10-CM | POA: Diagnosis not present

## 2015-04-24 HISTORY — PX: ORIF HUMERUS FRACTURE: SHX2126

## 2015-04-24 LAB — GLUCOSE, CAPILLARY
Glucose-Capillary: 94 mg/dL (ref 65–99)
Glucose-Capillary: 112 mg/dL — ABNORMAL HIGH (ref 65–99)

## 2015-04-24 SURGERY — OPEN REDUCTION INTERNAL FIXATION (ORIF) PROXIMAL HUMERUS FRACTURE
Anesthesia: General | Site: Shoulder | Laterality: Right | Wound class: Clean

## 2015-04-24 MED ORDER — PHENYLEPHRINE HCL 10 MG/ML IJ SOLN
10.0000 mg | INTRAVENOUS | Status: DC | PRN
  Administered 2015-04-24: 25 ug/min via INTRAVENOUS

## 2015-04-24 MED ORDER — ESTRADIOL 0.1 MG/GM VA CREA
1.0000 | TOPICAL_CREAM | Freq: Every day | VAGINAL | Status: DC
  Administered 2015-04-25: 1 via VAGINAL
  Filled 2015-04-24: qty 42.5

## 2015-04-24 MED ORDER — PHENYLEPHRINE HCL 10 MG/ML IJ SOLN
INTRAMUSCULAR | Status: DC | PRN
  Administered 2015-04-24: 100 ug via INTRAVENOUS
  Administered 2015-04-24 (×2): 150 ug via INTRAVENOUS

## 2015-04-24 MED ORDER — SODIUM CHLORIDE 0.9 % IV SOLN
INTRAVENOUS | Status: DC
  Administered 2015-04-24 (×2): via INTRAVENOUS

## 2015-04-24 MED ORDER — NITROFURANTOIN MACROCRYSTAL 50 MG PO CAPS
50.0000 mg | ORAL_CAPSULE | Freq: Every day | ORAL | Status: DC
  Administered 2015-04-25: 50 mg via ORAL
  Filled 2015-04-24 (×2): qty 1

## 2015-04-24 MED ORDER — OXYCODONE HCL 5 MG PO TABS
5.0000 mg | ORAL_TABLET | ORAL | Status: DC | PRN
  Administered 2015-04-24 (×2): 5 mg via ORAL
  Administered 2015-04-25 (×4): 10 mg via ORAL
  Administered 2015-04-25: 5 mg via ORAL
  Administered 2015-04-26 (×2): 10 mg via ORAL
  Filled 2015-04-24 (×2): qty 2
  Filled 2015-04-24: qty 1
  Filled 2015-04-24: qty 2
  Filled 2015-04-24: qty 1
  Filled 2015-04-24 (×4): qty 2

## 2015-04-24 MED ORDER — DIPHENHYDRAMINE HCL 12.5 MG/5ML PO ELIX: 12.5000 mg | ORAL_SOLUTION | ORAL | Status: DC | PRN

## 2015-04-24 MED ORDER — NEOMYCIN-POLYMYXIN B GU 40-200000 IR SOLN
Status: AC
  Filled 2015-04-24: qty 4

## 2015-04-24 MED ORDER — CEFAZOLIN SODIUM-DEXTROSE 2-3 GM-% IV SOLR
INTRAVENOUS | Status: AC
  Filled 2015-04-24: qty 50

## 2015-04-24 MED ORDER — MIDAZOLAM HCL 2 MG/2ML IJ SOLN
INTRAMUSCULAR | Status: DC | PRN
  Administered 2015-04-24: 2 mg via INTRAVENOUS

## 2015-04-24 MED ORDER — METOCLOPRAMIDE HCL 10 MG PO TABS: 5.0000 mg | ORAL_TABLET | Freq: Three times a day (TID) | ORAL | Status: DC | PRN

## 2015-04-24 MED ORDER — ACETAMINOPHEN 325 MG PO TABS: 650.0000 mg | ORAL_TABLET | Freq: Four times a day (QID) | ORAL | Status: DC | PRN

## 2015-04-24 MED ORDER — METOCLOPRAMIDE HCL 5 MG/ML IJ SOLN: 5.0000 mg | Freq: Three times a day (TID) | INTRAMUSCULAR | Status: DC | PRN

## 2015-04-24 MED ORDER — PROPOFOL 10 MG/ML IV BOLUS
INTRAVENOUS | Status: DC | PRN
  Administered 2015-04-24: 120 mg via INTRAVENOUS

## 2015-04-24 MED ORDER — GABAPENTIN 400 MG PO CAPS
400.0000 mg | ORAL_CAPSULE | Freq: Three times a day (TID) | ORAL | Status: DC
  Administered 2015-04-24 – 2015-04-26 (×6): 400 mg via ORAL
  Filled 2015-04-24 (×6): qty 1

## 2015-04-24 MED ORDER — FENTANYL CITRATE (PF) 100 MCG/2ML IJ SOLN: 25.0000 ug | INTRAMUSCULAR | Status: DC | PRN

## 2015-04-24 MED ORDER — DOCUSATE SODIUM 100 MG PO CAPS
100.0000 mg | ORAL_CAPSULE | Freq: Two times a day (BID) | ORAL | Status: DC
  Administered 2015-04-24 – 2015-04-25 (×4): 100 mg via ORAL
  Filled 2015-04-24 (×4): qty 1

## 2015-04-24 MED ORDER — FENTANYL CITRATE (PF) 100 MCG/2ML IJ SOLN
INTRAMUSCULAR | Status: DC | PRN
  Administered 2015-04-24: 25 ug via INTRAVENOUS
  Administered 2015-04-24: 50 ug via INTRAVENOUS
  Administered 2015-04-24: 125 ug via INTRAVENOUS

## 2015-04-24 MED ORDER — BISACODYL 10 MG RE SUPP: 10.0000 mg | Freq: Every day | RECTAL | Status: DC | PRN

## 2015-04-24 MED ORDER — OLOPATADINE HCL 0.1 % OP SOLN
1.0000 [drp] | Freq: Two times a day (BID) | OPHTHALMIC | Status: DC
  Filled 2015-04-24: qty 5

## 2015-04-24 MED ORDER — ENOXAPARIN SODIUM 30 MG/0.3ML ~~LOC~~ SOLN
30.0000 mg | Freq: Two times a day (BID) | SUBCUTANEOUS | Status: DC
  Administered 2015-04-25 (×2): 30 mg via SUBCUTANEOUS
  Filled 2015-04-24 (×2): qty 0.3

## 2015-04-24 MED ORDER — HYDROMORPHONE HCL 1 MG/ML IJ SOLN
1.0000 mg | INTRAMUSCULAR | Status: DC | PRN
  Administered 2015-04-24 (×3): 1 mg via INTRAVENOUS
  Filled 2015-04-24 (×3): qty 1

## 2015-04-24 MED ORDER — ONDANSETRON HCL 4 MG PO TABS
4.0000 mg | ORAL_TABLET | Freq: Four times a day (QID) | ORAL | Status: DC | PRN
Start: 2015-04-24 — End: 2015-04-26

## 2015-04-24 MED ORDER — ONDANSETRON HCL 4 MG/2ML IJ SOLN
INTRAMUSCULAR | Status: DC | PRN
  Administered 2015-04-24: 4 mg via INTRAVENOUS

## 2015-04-24 MED ORDER — ALPRAZOLAM 0.5 MG PO TABS
0.2500 mg | ORAL_TABLET | Freq: Two times a day (BID) | ORAL | Status: DC
  Administered 2015-04-24 – 2015-04-26 (×5): 0.25 mg via ORAL
  Filled 2015-04-24 (×5): qty 1

## 2015-04-24 MED ORDER — METOPROLOL SUCCINATE ER 100 MG PO TB24
100.0000 mg | ORAL_TABLET | Freq: Once | ORAL | Status: DC
  Filled 2015-04-24: qty 1

## 2015-04-24 MED ORDER — BUPIVACAINE HCL (PF) 0.5 % IJ SOLN
INTRAMUSCULAR | Status: AC
  Filled 2015-04-24: qty 30

## 2015-04-24 MED ORDER — LORATADINE 10 MG PO TABS
10.0000 mg | ORAL_TABLET | Freq: Every day | ORAL | Status: DC
  Filled 2015-04-24: qty 1

## 2015-04-24 MED ORDER — ROCURONIUM BROMIDE 100 MG/10ML IV SOLN
INTRAVENOUS | Status: DC | PRN
  Administered 2015-04-24: 35 mg via INTRAVENOUS

## 2015-04-24 MED ORDER — ZOLPIDEM TARTRATE 5 MG PO TABS
5.0000 mg | ORAL_TABLET | Freq: Every evening | ORAL | Status: DC | PRN
  Administered 2015-04-25: 5 mg via ORAL
  Filled 2015-04-24: qty 1

## 2015-04-24 MED ORDER — ACETAMINOPHEN 650 MG RE SUPP: 650.0000 mg | Freq: Four times a day (QID) | RECTAL | Status: DC | PRN

## 2015-04-24 MED ORDER — MAGNESIUM HYDROXIDE 400 MG/5ML PO SUSP: 30.0000 mL | Freq: Every day | ORAL | Status: DC | PRN

## 2015-04-24 MED ORDER — ONDANSETRON HCL 4 MG/2ML IJ SOLN: 4.0000 mg | Freq: Once | INTRAMUSCULAR | Status: DC | PRN

## 2015-04-24 MED ORDER — FLEET ENEMA 7-19 GM/118ML RE ENEM: 1.0000 | ENEMA | Freq: Once | RECTAL | Status: DC | PRN

## 2015-04-24 MED ORDER — LIDOCAINE HCL (CARDIAC) 20 MG/ML IV SOLN
INTRAVENOUS | Status: DC | PRN
  Administered 2015-04-24: 60 mg via INTRAVENOUS

## 2015-04-24 MED ORDER — NEOMYCIN-POLYMYXIN B GU 40-200000 IR SOLN
Status: DC | PRN
  Administered 2015-04-24: 4 mL

## 2015-04-24 MED ORDER — BUPIVACAINE HCL (PF) 0.5 % IJ SOLN
INTRAMUSCULAR | Status: DC | PRN
  Administered 2015-04-24: 30 mL

## 2015-04-24 MED ORDER — ROPIVACAINE HCL 5 MG/ML IJ SOLN
INTRAMUSCULAR | Status: AC
  Filled 2015-04-24: qty 20

## 2015-04-24 MED ORDER — ONDANSETRON HCL 4 MG/2ML IJ SOLN: 4.0000 mg | Freq: Four times a day (QID) | INTRAMUSCULAR | Status: DC | PRN

## 2015-04-24 MED ORDER — SODIUM CHLORIDE 0.9 % IV SOLN: 10000.0000 ug | INTRAVENOUS | Status: DC | PRN

## 2015-04-24 MED ORDER — PANTOPRAZOLE SODIUM 40 MG PO TBEC
40.0000 mg | DELAYED_RELEASE_TABLET | Freq: Two times a day (BID) | ORAL | Status: DC
  Administered 2015-04-24 – 2015-04-25 (×4): 40 mg via ORAL
  Filled 2015-04-24 (×4): qty 1

## 2015-04-24 MED ORDER — CEFAZOLIN SODIUM-DEXTROSE 2-3 GM-% IV SOLR
2.0000 g | Freq: Once | INTRAVENOUS | Status: AC
  Administered 2015-04-24: 2 g via INTRAVENOUS

## 2015-04-24 MED ORDER — FLUTICASONE PROPIONATE 50 MCG/ACT NA SUSP
1.0000 | Freq: Every day | NASAL | Status: DC
  Filled 2015-04-24: qty 16

## 2015-04-24 MED ORDER — CEFAZOLIN SODIUM-DEXTROSE 2-3 GM-% IV SOLR
2.0000 g | Freq: Four times a day (QID) | INTRAVENOUS | Status: AC
  Administered 2015-04-24 – 2015-04-25 (×3): 2 g via INTRAVENOUS
  Filled 2015-04-24 (×3): qty 50

## 2015-04-24 MED ORDER — METOPROLOL SUCCINATE ER 100 MG PO TB24
100.0000 mg | ORAL_TABLET | Freq: Every day | ORAL | Status: DC
  Administered 2015-04-25: 100 mg via ORAL
  Filled 2015-04-24: qty 1

## 2015-04-24 MED ORDER — KCL IN DEXTROSE-NACL 20-5-0.9 MEQ/L-%-% IV SOLN
INTRAVENOUS | Status: DC
  Administered 2015-04-24 – 2015-04-25 (×2): via INTRAVENOUS
  Filled 2015-04-24 (×6): qty 1000

## 2015-04-24 SURGICAL SUPPLY — 59 items
BANDAGE ELASTIC 4 CLIP ST LF (GAUZE/BANDAGES/DRESSINGS) IMPLANT
BIT DRILL 2.8X4 QC CORT (BIT) ×3 IMPLANT
BIT DRILL 4 LONG FAST STEP (BIT) ×3 IMPLANT
BIT DRILL 4 SHORT FAST STEP (BIT) ×3 IMPLANT
BNDG COHESIVE 4X5 TAN STRL (GAUZE/BANDAGES/DRESSINGS) ×3 IMPLANT
CANISTER SUCT 1200ML W/VALVE (MISCELLANEOUS) ×3 IMPLANT
CHLORAPREP W/TINT 26ML (MISCELLANEOUS) ×6 IMPLANT
DRAPE C-ARM XRAY 36X54 (DRAPES) ×6 IMPLANT
DRAPE INCISE IOBAN 66X45 STRL (DRAPES) ×6 IMPLANT
DRAPE U-SHAPE 47X51 STRL (DRAPES) ×3 IMPLANT
DRSG OPSITE POSTOP 4X8 (GAUZE/BANDAGES/DRESSINGS) ×3 IMPLANT
ELECT CAUTERY BLADE 6.4 (BLADE) ×3 IMPLANT
GAUZE PETRO XEROFOAM 1X8 (MISCELLANEOUS) IMPLANT
GAUZE SPONGE 4X4 12PLY STRL (GAUZE/BANDAGES/DRESSINGS) ×3 IMPLANT
GLOVE BIO SURGEON STRL SZ7.5 (GLOVE) ×6 IMPLANT
GLOVE BIO SURGEON STRL SZ8 (GLOVE) ×6 IMPLANT
GLOVE BIOGEL PI IND STRL 8 (GLOVE) ×2 IMPLANT
GLOVE BIOGEL PI INDICATOR 8 (GLOVE) ×4
GLOVE INDICATOR 8.0 STRL GRN (GLOVE) ×6 IMPLANT
GOWN STRL REUS W/ TWL LRG LVL3 (GOWN DISPOSABLE) ×2 IMPLANT
GOWN STRL REUS W/ TWL XL LVL3 (GOWN DISPOSABLE) ×3 IMPLANT
GOWN STRL REUS W/TWL LRG LVL3 (GOWN DISPOSABLE) ×4
GOWN STRL REUS W/TWL XL LVL3 (GOWN DISPOSABLE) ×6
HANDLE YANKAUER SUCT BULB TIP (MISCELLANEOUS) IMPLANT
KIT RM TURNOVER STRD PROC AR (KITS) ×3 IMPLANT
KIT STABILIZATION SHOULDER (MISCELLANEOUS) ×3 IMPLANT
MASK FACE SPIDER DISP (MASK) ×3 IMPLANT
NDL SAFETY 25GX1.5 (NEEDLE) ×3 IMPLANT
NS IRRIG 1000ML POUR BTL (IV SOLUTION) ×3 IMPLANT
PACK ARTHROSCOPY SHOULDER (MISCELLANEOUS) ×3 IMPLANT
PAD CAST CTTN 4X4 STRL (SOFTGOODS) IMPLANT
PAD GROUND ADULT SPLIT (MISCELLANEOUS) ×3 IMPLANT
PAD WRAPON POLAR SHDR UNIV (MISCELLANEOUS) ×1 IMPLANT
PADDING CAST COTTON 4X4 STRL (SOFTGOODS)
PEG STND 4.0X35MM (Orthopedic Implant) ×3 IMPLANT
PEG STND 4.0X40MM (Orthopedic Implant) ×6 IMPLANT
PEG STND 4.0X45.0MM (Orthopedic Implant) ×6 IMPLANT
PEG STND 4.0X50.0MM (Orthopedic Implant) ×6 IMPLANT
PEGSTD 4.0X35MM (Orthopedic Implant) ×1 IMPLANT
PEGSTD 4.0X40MM (Orthopedic Implant) ×2 IMPLANT
PEGSTD 4.0X45.0MM (Orthopedic Implant) ×2 IMPLANT
PEGSTD 4.0X50.0MM (Orthopedic Implant) ×2 IMPLANT
PLATE SHOULDER S3 3HOLE LT (Plate) ×3 IMPLANT
PUTTY DBX 1CC (Putty) ×3 IMPLANT
PUTTY DBX 1CC DEPUY (Putty) ×1 IMPLANT
SCREW LOCK 90D ANGLED 3.8X26 (Screw) ×6 IMPLANT
SCREW MULTIDIR 3.8X26 HUMRL (Screw) ×3 IMPLANT
SLING ARM LRG DEEP (SOFTGOODS) IMPLANT
SLING ARM M TX990204 (SOFTGOODS) IMPLANT
SLING ULTRA II M (MISCELLANEOUS) ×3 IMPLANT
STAPLER SKIN PROX 35W (STAPLE) ×3 IMPLANT
STOCKINETTE IMPERVIOUS 9X36 MD (GAUZE/BANDAGES/DRESSINGS) ×3 IMPLANT
SUT FIBERWIRE #2 38 BLUE 1/2 (SUTURE) ×12
SUT PROLENE 4 0 PS 2 18 (SUTURE) ×6 IMPLANT
SUT VIC AB 2-0 CT1 27 (SUTURE) ×4
SUT VIC AB 2-0 CT1 TAPERPNT 27 (SUTURE) ×2 IMPLANT
SUTURE FIBERWR #2 38 BLUE 1/2 (SUTURE) ×4 IMPLANT
SYRINGE 10CC LL (SYRINGE) ×3 IMPLANT
WRAPON POLAR PAD SHDR UNIV (MISCELLANEOUS) ×3

## 2015-04-24 NOTE — Anesthesia Procedure Notes (Addendum)
Procedure Name: Intubation Date/Time: 04/24/2015 8:03 AM Performed by: Rosaria Ferries, DAVID Pre-anesthesia Checklist: Patient identified, Emergency Drugs available, Suction available and Patient being monitored Patient Re-evaluated:Patient Re-evaluated prior to inductionOxygen Delivery Method: Circle system utilized Preoxygenation: Pre-oxygenation with 100% oxygen Intubation Type: IV induction Laryngoscope Size: Mac and 3 Grade View: Grade I Tube size: 7.0 mm Number of attempts: 1    Anesthesia Regional Block:  Interscalene brachial plexus block  Pre-Anesthetic Checklist: ,, timeout performed, Correct Patient, Correct Site, Correct Laterality, Correct Procedure, Correct Position, site marked, Risks and benefits discussed,  Surgical consent,  Pre-op evaluation,  At surgeon's request and post-op pain management  Laterality: Right  Prep: Betadine and alcohol swabs       Needles:  Injection technique: Single-shot  Needle Type: Stimiplex     Needle Length: 5cm 5 cm Needle Gauge: 22 and 22 G    Additional Needles:  Procedures: nerve stimulator Interscalene brachial plexus block  Nerve Stimulator or Paresthesia:  Response: 0.5 mA,   Additional Responses:   Narrative:  Start time: 04/24/2015 7:55 AM End time: 04/24/2015 8:00 AM Anesthesiologist: Alvin Critchley  Additional Notes: Time out called.   Right interscalene groove mapped and marked.  A skin wheal was made in the groove with 2% Lidocaine plain.   A 22G stimuplex needle was advance with a twitch down to 0.5 mAmps.  Easy injection of 25 cc of aan equal mix of 0.5% + 0.2% Ropivacaine with epi 1:200K.   Easy injection with no resistance Pt tolerated the procedure well.   Dosing using the incremental aspiration every 5 cc.

## 2015-04-24 NOTE — OR Nursing (Signed)
Patient and family unclear as to if metoprolol was taken this am or last night, confusion with gabapentin and metoprolol. BP and HR on lower side of normal and dose of metoprolol not given to patient this am per anesthesia.

## 2015-04-24 NOTE — Op Note (Signed)
04/24/2015  10:29 AM  Patient:   [name]  Pre-Op Diagnosis:   Closed displaced 2 part surgical neck fracture with delayed healing, right proximal humerus.  Post-Op Diagnosis:   Same.  Procedure:   Open reduction and internal fixation of two-part displaced surgical neck fracture, right proximal humerus.  Surgeon:   Pascal Lux, MD  Assistant:   Cameron Proud, PA-C  Anesthesia:   General endotracheal intubation and anesthesia with interscalene block placed preoperatively by the anesthesiologist.  Findings:   As above.  Complications:   None  EBL:   150 cc  Fluids:   1300 cc crystalloid  UOP:   600 cc  TT:   None  Drains:   None  Closure:   Staples  Implants:   3-hole precontoured Biomet right proximal humerus fracture plate with 3 bicortical screws and 6 pegs  Brief Clinical Note:   The patient is a 69 year old female who sustained the above-noted injury over 2 months ago following a fall. Initially, her x-rays showed good alignment and nonoperative treatment was initiated. On return, her fracture was noted to have displaced and was in danger of not healing properly. She presents at this time for definitive management of this situation.  Procedure:   The patient was brought into the operating room and lain in the supine position on the OR table. After adequate IV sedation was achieved, an interscalene block was placed by the anesthesiologist. The patient then underwent general endotracheal intubation and anesthesia before being repositioned in the beach chair position using the beach chair positioner. The right shoulder and upper extremity were prepped with ChloraPrep solution before being draped sterilely. Preoperative antibiotics were administered. A standard anterior approach the shoulder was made through an approximately 4-5 inch incision. The incision was carried down through the subcutaneous tissues to expose the deltopectoral fascia. The interval between the  deltoid and pectoralis muscles was identified and this plane developed, retracting the cephalic vein laterally with the deltoid muscle. The conjoined tendon was identified. The lateral margin was dissected and the Kolbel self-retraining retractor inserted. The superior most edge of the pectoralis major tendon was released in order to improve visualization. The fracture was identified and opened. Fracture hematoma and fibrous scar tissue were debrided sharply with a 15 blade as well as with a curet and rongeurs. Two #2 fiber wires were passed through the supraspinatus and infraspinous spinatus tendons to help control the proximal humerus. Once the fracture hematoma and fibrous tissues were debrided, the humeral shaft was reduced inside the proximal humerus and gently impacted to improve stability. Alignment was based on the position of the biceps tendon. A Biomet 3-hole precontoured proximal humerus plate was positioned and temperature secured using a guidewire placed into the center of the humeral head. The adequacy of plate position and pin position was verified fluoroscopically in AP and superior and inferior oblique projections after repositioning of the pin, it was deemed to be in excellent position in all rotations centered in the humeral head. The plate was secured to the humeral shaft using three bicortical screws. Each screw was assessed for position fluoroscopically and found to be satisfactory. The proximal portion of the humerus was secured to the plate using six pegs of appropriate length inserted sequentially. One was deemed to be too long and replaced with a plate of appropriate length. Again the construct was assessed fluoroscopically in AP, internal rotation, and external rotation views and found to be excellent. The fiber wires that had been placed  through the rotator cuff were passed through the corners of the plate and tied securely to further stabilize the proximal humerus to the plate.  The  wound was copiously irrigated with bacitracin saline solution using a bulb syringe before 1 cc of DBX putty was injected in and around the fracture site to stimulate healing. The deltopectoral interval was closed using 2-0 Vicryl interrupted sutures before the subcutaneous tissues also were closed using 2-0 Vicryl interrupted sutures. The skin was closed using staples. A total of 20 cc of half percent Sensorcaine with epinephrine was injected in and around the incision to help with postoperative analgesia. A sterile occlusive dressing was applied to the wound before the arm was placed into a shoulder immobilizer with an abduction pillow. A polar pack system also was applied to the shoulder. The patient was then transferred back to a hospital bed before being awakened, extubated, and returned to the recovery room in satisfactory condition after tolerating the procedure well.

## 2015-04-24 NOTE — Anesthesia Preprocedure Evaluation (Addendum)
Anesthesia Evaluation  Patient identified by MRN, date of birth, ID band Patient awake    Reviewed: Allergy & Precautions, NPO status , Patient's Chart, lab work & pertinent test results  Airway Mallampati: II  TM Distance: >3 FB Neck ROM: Full    Dental no notable dental hx. (+) Caps   Pulmonary Current Smoker,  breath sounds clear to auscultation  Pulmonary exam normal       Cardiovascular hypertension, Normal cardiovascular exam    Neuro/Psych Neuropathy in legs negative psych ROS   GI/Hepatic GERD-  Medicated and Controlled,  Endo/Other  diabetes, Well Controlled, Type 2Hypothyroidism   Renal/GU      Musculoskeletal   Abdominal Normal abdominal exam  (+)   Peds  Hematology   Anesthesia Other Findings   Reproductive/Obstetrics                            Anesthesia Physical Anesthesia Plan  ASA: III  Anesthesia Plan: General   Post-op Pain Management: MAC Combined w/ Regional for Post-op pain   Induction: Intravenous  Airway Management Planned: Oral ETT  Additional Equipment:   Intra-op Plan:   Post-operative Plan: Extubation in OR  Informed Consent: I have reviewed the patients History and Physical, chart, labs and discussed the procedure including the risks, benefits and alternatives for the proposed anesthesia with the patient or authorized representative who has indicated his/her understanding and acceptance.   Dental advisory given  Plan Discussed with: CRNA and Surgeon  Anesthesia Plan Comments: (Discussed interscalene nerve block for post op pain control and patient/ husband understand risks and wish to proceed with the procedure)        Anesthesia Quick Evaluation

## 2015-04-24 NOTE — Progress Notes (Signed)
ANTICOAGULATION CONSULT NOTE - Initial Consult  Pharmacy Consult for Lovenox Indication: VTE prophylaxis  Allergies  Allergen Reactions  . Boniva [Ibandronic Acid]     Pain neuropathy worse  . Codeine Other (See Comments)    Nervous/hyper  . Erythromycin Other (See Comments)    "Liver damage"  . Levaquin [Levofloxacin In D5w] Other (See Comments)    Hallucinations     Patient Measurements: Height: 5\' 5"  (165.1 cm) Weight: 119 lb (53.978 kg) IBW/kg (Calculated) : 57 Heparin Dosing Weight:   Vital Signs: Temp: 97.4 F (36.3 C) (08/11 1320) Temp Source: Oral (08/11 1320) BP: 138/71 mmHg (08/11 1320) Pulse Rate: 71 (08/11 1320)  Labs: No results for input(s): HGB, HCT, PLT, APTT, LABPROT, INR, HEPARINUNFRC, CREATININE, CKTOTAL, CKMB, TROPONINI in the last 72 hours.  Estimated Creatinine Clearance: 57.4 mL/min (by C-G formula based on Cr of 0.44).   Medical History: Past Medical History  Diagnosis Date  . Diabetes mellitus without complication   . Hypertension   . Neuropathy   . GERD (gastroesophageal reflux disease)   . Hypothyroidism   . Breast cancer     breast-left  . Bed sore on heel   . Bed sore on elbow     Medications:  Prescriptions prior to admission  Medication Sig Dispense Refill Last Dose  . ALPRAZolam (XANAX) 0.25 MG tablet Take 0.25 mg by mouth 2 (two) times daily.   04/24/2015 at Unknown time  . estradiol (ESTRACE) 0.1 MG/GM vaginal cream Place 1 Applicatorful vaginally at bedtime.   01/22/2015 at Unknown time  . fexofenadine (ALLEGRA) 180 MG tablet Take 180 mg by mouth daily.   04/24/2015 at Unknown time  . gabapentin (NEURONTIN) 400 MG capsule Take 400 mg by mouth 3 (three) times daily.   04/24/2015 at 0630  . HYDROcodone-acetaminophen (NORCO/VICODIN) 5-325 MG per tablet Take 1 tablet by mouth every 6 (six) hours as needed for moderate pain.     Marland Kitchen ibuprofen (ADVIL,MOTRIN) 200 MG tablet Take 200 mg by mouth every 6 (six) hours as needed.   04/16/2015  at Unknown time  . lansoprazole (PREVACID) 30 MG capsule Take 30 mg by mouth daily at 12 noon.   04/24/2015 at Unknown time  . metoprolol succinate (TOPROL-XL) 100 MG 24 hr tablet Take 100 mg by mouth daily. Take with or immediately following a meal.   04/24/2015 at 2000  . mometasone (NASONEX) 50 MCG/ACT nasal spray Place 2 sprays into the nose daily.   04/22/2015 at Unknown time  . nitrofurantoin (MACRODANTIN) 50 MG capsule Take 50 mg by mouth daily.   04/24/2015 at 0630  . olopatadine (PATANOL) 0.1 % ophthalmic solution Place 1 drop into both eyes 2 (two) times daily.   04/21/2015 at Unknown time  . zaleplon (SONATA) 10 MG capsule Take 10 mg by mouth at bedtime as needed for sleep.   04/23/2015 at Unknown time    Assessment: CrCl = 57.4 ml/min S/P surgical repair of fractured humerus  Goal of Therapy:  DVT prophylaxis   Plan:  Lovenox 40 mg SQ Q24H originally ordered to start 8/12 @ 8:00.  Will adjust dose to Lovenox 30 mg SQ Q12H to start 8/12 @ 8:00 based on s/p surgical repair of humerus.   Roslind Michaux D 04/24/2015,2:22 PM

## 2015-04-24 NOTE — Care Management Note (Addendum)
Case Management Note  Patient Details  Name: [name] MRN: [id number] Date of Birth: 05-07-1946  Subjective/Objective:                  Met with patient's husband as RNs worked with getting patient comfortable in bed. Patient just received from PACU after shoulder surgery. She is walker dependent but has had difficulty since injuring this arm and husband has been using wheelchair. She has no steps to get into home. She also has a ramp to access other parts of the home. She uses [facility] for Rx. If SNF is needed patient's husband said "do not let insurance dictate patient needs at discharge". Im not sure if that meant he can pay privately for SNF or not. Home health will be covered. Arms/shoulders are usually 2 day LOS unless medical necessity is met for continued stay. She has Medicare which requires medically necessary stay of 3- inpatient nights.   Action/Plan: List of home health providers left with husband. RNCM will continue to follow.   Expected Discharge Date:  04/26/15               Expected Discharge Plan:     In-House Referral:     Discharge planning Services  CM Consult  Post Acute Care Choice:  Home Health, Durable Medical Equipment Choice offered to:  Patient, Spouse  DME Arranged:    DME Agency:     HH Arranged:    [place] Agency:     Status of Service:  In process, will continue to follow  Medicare Important Message Given:    Date Medicare IM Given:    Medicare IM give by:    Date Additional Medicare IM Given:    Additional Medicare Important Message give by:     If discussed at [place] of Stay Meetings, dates discussed:    Additional Comments:  Marshell Garfinkel, RN 04/24/2015, 2:38 PM

## 2015-04-24 NOTE — Transfer of Care (Signed)
Immediate Anesthesia Transfer of Care Note  Patient: [name]  Procedure(s) Performed: Procedure(s): OPEN REDUCTION INTERNAL FIXATION (ORIF) PROXIMAL HUMERUS FRACTURE (Right)  Patient Location: PACU  Anesthesia Type:General  Level of Consciousness: sedated  Airway & Oxygen Therapy: Patient Spontanous Breathing and Patient connected to nasal cannula oxygen  Post-op Assessment: Report given to RN and Post -op Vital signs reviewed and stable  Post vital signs: Reviewed and stable  Last Vitals:  Filed Vitals:   04/24/15 1051  BP: 153/93  Pulse:   Temp: 36.1 C  Resp: 14    Complications: No apparent anesthesia complications

## 2015-04-24 NOTE — H&P (Signed)
Paper H&P to be scanned into permanent record. H&P reviewed. No changes. 

## 2015-04-24 NOTE — Progress Notes (Signed)
Patient skin assessment performed with Norm Parcel. Patient sacrum and mid back are both red and blanchable with allevyn applied. Boggy heel on the left, floated and allevyn applied. Right heel has area the size of 1cm * 1cm, allevyn applied. Right elbow has 1cm * 1cm with .05 whole, allevyn applied. Patient sees wound care center for treatment.

## 2015-04-25 ENCOUNTER — Encounter: Payer: Self-pay | Admitting: Surgery

## 2015-04-25 LAB — POTASSIUM: Potassium: 3 mmol/L — ABNORMAL LOW (ref 3.5–5.1)

## 2015-04-25 LAB — CBC WITH DIFFERENTIAL/PLATELET
BASOS ABS: 0.1 10*3/uL (ref 0–0.1)
BASOS PCT: 1 %
EOS PCT: 1 %
Eosinophils Absolute: 0.1 10*3/uL (ref 0–0.7)
HCT: 33.5 % — ABNORMAL LOW (ref 35.0–47.0)
Hemoglobin: 11.5 g/dL — ABNORMAL LOW (ref 12.0–16.0)
Lymphocytes Relative: 9 %
Lymphs Abs: 1 10*3/uL (ref 1.0–3.6)
MCH: 35 pg — AB (ref 26.0–34.0)
MCHC: 34.4 g/dL (ref 32.0–36.0)
MCV: 101.9 fL — ABNORMAL HIGH (ref 80.0–100.0)
Monocytes Absolute: 1.5 10*3/uL — ABNORMAL HIGH (ref 0.2–0.9)
Monocytes Relative: 14 %
NEUTROS ABS: 8.3 10*3/uL — AB (ref 1.4–6.5)
Neutrophils Relative %: 75 %
Platelets: 234 10*3/uL (ref 150–440)
RBC: 3.29 MIL/uL — ABNORMAL LOW (ref 3.80–5.20)
RDW: 12.9 % (ref 11.5–14.5)
WBC: 11 10*3/uL (ref 3.6–11.0)

## 2015-04-25 LAB — BASIC METABOLIC PANEL
Anion gap: 7 (ref 5–15)
BUN: 6 mg/dL (ref 6–20)
CO2: 22 mmol/L (ref 22–32)
Calcium: 8.6 mg/dL — ABNORMAL LOW (ref 8.9–10.3)
Chloride: 107 mmol/L (ref 101–111)
Creatinine, Ser: 0.57 mg/dL (ref 0.44–1.00)
GFR calc Af Amer: >60 mL/min (ref >60)
GLUCOSE: 153 mg/dL — AB (ref 65–99)
Potassium: 3 mmol/L — ABNORMAL LOW (ref 3.5–5.1)
Sodium: 136 mmol/L (ref 135–145)

## 2015-04-25 MED ORDER — POTASSIUM CHLORIDE CRYS ER 20 MEQ PO TBCR
20.0000 meq | EXTENDED_RELEASE_TABLET | Freq: Three times a day (TID) | ORAL | Status: AC
  Administered 2015-04-25 (×3): 20 meq via ORAL
  Filled 2015-04-25 (×3): qty 1

## 2015-04-25 NOTE — Progress Notes (Addendum)
  Subjective: 1 Day Post-Op Procedure(s) (LRB): OPEN REDUCTION INTERNAL FIXATION (ORIF) PROXIMAL HUMERUS FRACTURE (Right) Patient reports pain as 6 on 0-10 scale.   Patient is well, and has had no acute complaints or problems Plan is to go Home with homehealth PT after hospital stay. Negative for chest pain and shortness of breath Fever: no Gastrointestinal:Negative for nausea and vomiting  Objective: Vital signs in last 24 hours: Temp:  [97 F (36.1 C)-98.6 F (37 C)] 98.3 F (36.8 C) (08/12 0356) Pulse Rate:  [59-111] 82 (08/12 0356) Resp:  [14-18] 18 (08/12 0356) BP: (121-169)/(55-93) 150/60 mmHg (08/12 0356) SpO2:  [97 %-100 %] 98 % (08/12 0356)  Intake/Output from previous day:  Intake/Output Summary (Last 24 hours) at 04/25/15 0733 Last data filed at 04/25/15 0600  Gross per 24 hour  Intake   2156 ml  Output   2400 ml  Net   -244 ml    Intake/Output this shift:    Labs:  Recent Labs  04/25/15 0343  HGB 11.5*    Recent Labs  04/25/15 0343  WBC 11.0  RBC 3.29*  HCT 33.5*  PLT 234    Recent Labs  04/25/15 0343  NA 136  K 3.0*  CL 107  CO2 22  BUN 6  CREATININE 0.57  GLUCOSE 153*  CALCIUM 8.6*   No results for input(s): LABPT, INR in the last 72 hours.   EXAM General - Patient is Alert, Appropriate and Oriented Extremity - Neurologically intact ABD soft Dorsiflexion/Plantar flexion intact Incision: scant drainage No cellulitis present Dressing/Incision - blood tinged drainage Motor Function - intact, moving foot and toes well on exam.   Abdomen soft on exam today.  Normal BS.  Non-distended without tympany. Foley was removed POD1  Past Medical History  Diagnosis Date  . Diabetes mellitus without complication   . Hypertension   . Neuropathy   . GERD (gastroesophageal reflux disease)   . Hypothyroidism   . Breast cancer     breast-left  . Bed sore on heel   . Bed sore on elbow     Assessment/Plan: 1 Day Post-Op Procedure(s)  (LRB): OPEN REDUCTION INTERNAL FIXATION (ORIF) PROXIMAL HUMERUS FRACTURE (Right) Active Problems:   Fracture, humerus, proximal  Estimated body mass index is 19.8 kg/(m^2) as calculated from the following:   Height as of this encounter: 5\' 5"  (1.651 m).   Weight as of this encounter: 53.978 kg (119 lb). Advance diet Up with therapy   Foley has been removed today. K+ 3.0 this morning.  She is currently on Dextrose 5% and 0.9% NaCl with KCL 20 mEq/L infusion at 75 mL/hr.  I have added Klor-con 20 mEq for three doses today and will check potassium at 12 and BMP tomorrow morning.  Will d/c IV fluids this morning.  Pt to be evaluated today by PT.  DVT Prophylaxis - Lovenox, Foot Pumps and TED hose No lifting with right upper extremity.  Raquel Sidrah Harden, PA-C [facility] 04/25/2015, 7:33 AM

## 2015-04-25 NOTE — Discharge Instructions (Signed)
NSTRUCTIONS AFTER Surgery  o Remove items at home which could result in a fall. This includes throw rugs or furniture in walking pathways o ICE to the affected joint every three hours while awake for 30 minutes at a time, for at least the first 3-5 days, and then as needed for pain and swelling.  Continue to use ice for pain and swelling. You may notice swelling that will progress down to the foot and ankle.  This is normal after surgery.  Elevate your leg when you are not up walking on it.   o Continue to use the breathing machine you got in the hospital (incentive spirometer) which will help keep your temperature down.  It is common for your temperature to cycle up and down following surgery, especially at night when you are not up moving around and exerting yourself.  The breathing machine keeps your lungs expanded and your temperature down.   DIET:  As you were doing prior to hospitalization, we recommend a well-balanced diet.  DRESSING / WOUND CARE / SHOWERING You may shower but do not get dressing applied to right shoulder wet.  ACTIVITY  o Increase activity slowly as tolerated, but follow the weight bearing instructions below.   o No driving for 6 weeks or until further direction given by your physician.  You cannot drive while taking narcotics.  o No lifting with your right shoulder. until further directed by your surgeon. o Avoid periods of inactivity such as sitting longer than an hour when not asleep. This helps prevent blood clots.  o You may return to work once you are authorized by your doctor.   WEIGHT BEARING  No lifting with your right upper extremity.  EXERCISES Continue to wear sling and abduction pillow until being seen at first post-op appointment. PT will work with you on moving your right shoulder forward and out as well.  Do not rotate your right arm out away from your body.  CONSTIPATION  Constipation is defined medically as fewer than three stools per week and  severe constipation as less than one stool per week.  Even if you have a regular bowel pattern at home, your normal regimen is likely to be disrupted due to multiple reasons following surgery.  Combination of anesthesia, postoperative narcotics, change in appetite and fluid intake all can affect your bowels.   YOU MUST use at least one of the following options; they are listed in order of increasing strength to get the job done.  They are all available over the counter, and you may need to use some, POSSIBLY even all of these options:    Drink plenty of fluids (prune juice may be helpful) and high fiber foods Colace 100 mg by mouth twice a day  Senokot for constipation as directed and as needed Dulcolax (bisacodyl), take with full glass of water  Miralax (polyethylene glycol) once or twice a day as needed.  If you have tried all these things and are unable to have a bowel movement in the first 3-4 days after surgery call either your surgeon or your primary doctor.    If you experience loose stools or diarrhea, hold the medications until you stool forms back up.  If your symptoms do not get better within 1 week or if they get worse, check with your doctor.  If you experience "the worst abdominal pain ever" or develop nausea or vomiting, please contact the office immediately for further recommendations for treatment.   ITCHING:  If  you experience itching with your medications, try taking only a single pain pill, or even half a pain pill at a time.  You can also use Benadryl over the counter for itching or also to help with sleep.   MEDICATIONS:  See your medication summary on the After Visit Summary that nursing will review with you.  You may have some home medications which will be placed on hold until you complete the course of blood thinner medication.  It is important for you to complete the blood thinner medication as prescribed.  PRECAUTIONS:  If you experience chest pain or shortness of  breath - call 911 immediately for transfer to the hospital emergency department.   If you develop a fever greater that 101 F, purulent drainage from wound, increased redness or drainage from wound, foul odor from the wound/dressing, or calf pain - CONTACT YOUR SURGEON.                                                   FOLLOW-UP APPOINTMENTS:  If you do not already have a post-op appointment, please call the office for an appointment to be seen by your surgeon.  Guidelines for how soon to be seen are listed in your After Visit Summary, but are typically between 1-4 weeks after surgery.  MAKE SURE YOU:   Understand these instructions.   Get help right away if you are not doing well or get worse.    Thank you for letting us be a part of your medical care team.  It is a privilege we respect greatly.  We hope these instructions will help you stay on track for a fast and full recovery!

## 2015-04-25 NOTE — Discharge Summary (Signed)
Physician Discharge Summary  Patient ID: [name] MRN: [id number] DOB/AGE: April 25, 1946 69 y.o.  Admit date: 04/24/2015 Discharge date: 04/25/2015  Admission Diagnoses:  CLOSED 2PART DISPLACED FX    Discharge Diagnoses: Patient Active Problem List   Diagnosis Date Noted  . Fracture, humerus, proximal 04/24/2015  Closed displaced 2 part surgical neck fracture with delayed healing, right proximal humerus.  Past Medical History  Diagnosis Date  . Diabetes mellitus without complication   . Hypertension   . Neuropathy   . GERD (gastroesophageal reflux disease)   . Hypothyroidism   . Breast cancer     breast-left  . Bed sore on heel   . Bed sore on elbow      Transfusion: None   Consultants (if any):    Discharged Condition: Improved  Hospital Course: [name] is an 69 y.o. female who was admitted 04/24/2015 with a diagnosis of closed displaced 2 part surgical neck fracture with delayed healing, right proximal humerus and went to the operating room on 04/24/2015 and underwent the above named procedures.    Surgeries: Procedure(s): OPEN REDUCTION INTERNAL FIXATION (ORIF) PROXIMAL HUMERUS FRACTURE on 04/24/2015 Patient tolerated the surgery well. Taken to PACU where she was stabilized and then transferred to the orthopedic floor.  Started on Lovenox 30mg  q 12 hrs. Foot pumps applied bilaterally at 80 mm. Heels elevated on bed with rolled towels. No evidence of DVT. Negative Homan. Physical therapy started on day #1 for gait training and transfer. OT started day #1 for ADL and assisted devices.  Patient's IV , foley was removed on POD1.   Implants: 3-hole precontoured Biomet right proximal humerus fracture plate with 3 bicortical screws and 6 pegs.  She was given perioperative antibiotics:  Anti-infectives    Start     Dose/Rate Route Frequency Ordered Stop   04/24/15 1430  ceFAZolin (ANCEF) IVPB 2 g/50 mL premix     2 g 100 mL/hr over 30 Minutes Intravenous Every 6  hours 04/24/15 1139 04/25/15 0255   04/24/15 0700  ceFAZolin (ANCEF) IVPB 2 g/50 mL premix     2 g 100 mL/hr over 30 Minutes Intravenous  Once 04/24/15 0646 04/24/15 0845   04/24/15 0554  ceFAZolin (ANCEF) 2-3 GM-% IVPB SOLR    Comments:  Ronnell Freshwater: cabinet override      04/24/15 0554 04/24/15 1759    .  She was given sequential compression devices, early ambulation, and Lovenox for DVT prophylaxis.  She benefited maximally from the hospital stay and there were no complications.    Recent vital signs:  Filed Vitals:   04/25/15 1540  BP: 126/53  Pulse: 73  Temp: 98.2 F (36.8 C)  Resp: 16    Recent laboratory studies:  Lab Results  Component Value Date   HGB 11.5* 04/25/2015   HGB 14.5 01/21/2015   HGB 13.2 12/16/2011   Lab Results  Component Value Date   WBC 11.0 04/25/2015   PLT 234 04/25/2015   No results found for: INR Lab Results  Component Value Date   NA 136 04/25/2015   K 3.0* 04/25/2015   CL 107 04/25/2015   CO2 22 04/25/2015   BUN 6 04/25/2015   CREATININE 0.57 04/25/2015   GLUCOSE 153* 04/25/2015    Discharge Medications:     Medication List    ASK your doctor about these medications        ALPRAZolam 0.25 MG tablet  Commonly known as:  XANAX  Take 0.25 mg by mouth  2 (two) times daily.     estradiol 0.1 MG/GM vaginal cream  Commonly known as:  ESTRACE  Place 1 Applicatorful vaginally at bedtime.     fexofenadine 180 MG tablet  Commonly known as:  ALLEGRA  Take 180 mg by mouth daily.     gabapentin 400 MG capsule  Commonly known as:  NEURONTIN  Take 400 mg by mouth 3 (three) times daily.     HYDROcodone-acetaminophen 5-325 MG per tablet  Commonly known as:  NORCO/VICODIN  Take 1 tablet by mouth every 6 (six) hours as needed for moderate pain.     ibuprofen 200 MG tablet  Commonly known as:  ADVIL,MOTRIN  Take 200 mg by mouth every 6 (six) hours as needed.     lansoprazole 30 MG capsule  Commonly known as:  PREVACID  Take 30  mg by mouth daily at 12 noon.     metoprolol succinate 100 MG 24 hr tablet  Commonly known as:  TOPROL-XL  Take 100 mg by mouth daily. Take with or immediately following a meal.     mometasone 50 MCG/ACT nasal spray  Commonly known as:  NASONEX  Place 2 sprays into the nose daily.     nitrofurantoin 50 MG capsule  Commonly known as:  MACRODANTIN  Take 50 mg by mouth daily.     olopatadine 0.1 % ophthalmic solution  Commonly known as:  PATANOL  Place 1 drop into both eyes 2 (two) times daily.     zaleplon 10 MG capsule  Commonly known as:  SONATA  Take 10 mg by mouth at bedtime as needed for sleep.        Diagnostic Studies: Dg Shoulder Right  04/24/2015   CLINICAL DATA:  ORIF of right proximal humerus dated April 24, 2015  EXAM: RIGHT SHOULDER - 2+ VIEW  COMPARISON:  Preoperative examination of Feb 03, 2015  FINDINGS: The patient has undergone ORIF for a comminuted angulated fracture of the humeral head and neck. 3 fluoro spot films are submitted. Fluoro time is reported as 30 seconds.  The metallic sideplate and the cortical screws appear to be appropriately positioned. Alignment of the humeral head and neck and proximal shaft is near anatomic.  IMPRESSION: There is no immediate postprocedure complication following ORIF for a fracture of the humeral head and neck.   Electronically Signed   By: Mckenzie  [name] M.D.   On: 04/24/2015 11:32   Dg C-arm 61-120 Min  04/24/2015   CLINICAL DATA:  Status post right shoulder joint replacement  EXAM: DG C-ARM 61-120 MIN  COMPARISON:  Preoperative CT scan of the shoulder of Feb 03, 2015  FINDINGS: Fluoro time reported: 30 seconds. The patient has undergone ORIF for fracture of the right humeral neck. The metallic side plate and cortical screws are appear appropriately positioned. The native bone exhibits no acute abnormality beyond known fracture. Alignment is near anatomic.  IMPRESSION: There is no immediate postprocedure complication following ORIF  for a right humeral neck fracture.   Electronically Signed   By: Mckenzie  [name] M.D.   On: 04/24/2015 14:09    Disposition: Stable and ready for discharge.  Pt benefited greatly from her hospital stay and is ready for discharge.  PT is recommending home with home PT.  Pt was kept overnight for K+, which is up to 3.5 this AM.       Follow-up Information    Follow up with Corky Mull, MD In 10 days.   Specialty:  Surgery  Why:  For staple removal, For wound re-check   Contact information:   [place] [address] [phone]      Signed: Judson Roch PA-C 04/25/2015, 8:49 PM

## 2015-04-25 NOTE — Plan of Care (Signed)
Problem: Phase I Progression Outcomes Goal: Pain controlled with appropriate interventions Outcome: Progressing Pt pain control with iv and oral pain medication Goal: Incision/dressings dry and intact Outcome: Progressing Incision remaining dry and intact.

## 2015-04-25 NOTE — Progress Notes (Signed)
Physical Therapy Treatment Patient Details Name: [name] MRN: [id number] DOB: 11-22-1945 Today's Date: 04/25/2015    History of Present Illness s/p R humerus ORIF    PT Comments    Pt was able to complete unsupported seated therex with no LOBs and reports ascending 14 stairs at home prior to hospital visit.  Regarding standing, anxiety provokes her to lose balance secondary to hx of falling, with verbal cuing to focus on balance more than fear, pt was able to stand up with min assist using a hemiwalker. Regarding ambulation, pt demonstrates poor hemiwalker placement and verbal/tactile cuing required to avoid leaning posteriorly with gait, requires mod assist. Pt would benefit from skilled PT to address her FOF with education on proper gait technique, increase strength in R shoulder once cleared by MD.      Follow Up Recommendations  Home health PT     Equipment Recommendations  Other (comment) (hemiwalker)    Recommendations for Other Services       Precautions / Restrictions Precautions Precautions: Fall Restrictions Weight Bearing Restrictions: No    Mobility  Bed Mobility Overal bed mobility: Needs Assistance Bed Mobility: Supine to Sit     Supine to sit: Min assist        Transfers Overall transfer level: Needs assistance Equipment used: Hemi-walker Transfers: Sit to/from Stand Sit to Stand: Min assist (Anxiety provokes her to lose balance, with verbal cuing to focus on balance more than fear, pt was able to stand up easier. )            Ambulation/Gait Ambulation/Gait assistance: Mod assist Ambulation Distance (Feet): 5 Feet Assistive device: Hemi-walker     Gait velocity interpretation: <1.8 ft/sec, indicative of risk for recurrent falls General Gait Details: Pt needs tactile facilitation for proper hemiwalker placement and verbal/tactile cuing required to avoid leaning posteriorly with gait (requires mod assist).   Stairs             Wheelchair Mobility    Modified Rankin (Stroke Patients Only)       Balance Overall balance assessment: Needs assistance   Sitting balance-Leahy Scale: Normal Sitting balance - Comments: Pt able to complet bilat hip marches/isometric hip abd with no LOBs present   Standing balance support: Single extremity supported Standing balance-Leahy Scale: Poor Standing balance comment: Pt unable to stand on her own secondary to posterior lean, requires mod assist.                     Cognition Arousal/Alertness: Awake/alert Behavior During Therapy: WFL for tasks assessed/performed Overall Cognitive Status: Within Functional Limits for tasks assessed                      Exercises General Exercises - Lower Extremity Ankle Circles/Pumps: AROM;10 reps Heel Slides: AROM;10 reps Hip ABduction/ADduction: AROM;10 reps Other Exercises Other Exercises: Unsupported seated bilar hip marches/isometric hip abd with 2 sec holds, 1 x 10.  Other Exercises: Sit to stand from EOB with L UE suppor on hemiwalker, x3 with min assist.     General Comments        Pertinent Vitals/Pain Pain Assessment: 0-10 Pain Score: 5     Home Living Family/patient expects to be discharged to:: Private residence Living Arrangements: Spouse/significant other Available Help at Discharge: Family Type of Home: House Home Access: Ramped entrance (husband has set up a bedroom on the first floor)            Prior Function  Level of Independence: Needs assistance  Gait / Transfers Assistance Needed: husband would be with her for almost all standing/walking, has been scooting down the stairs on her bottom       PT Goals (current goals can now be found in the care plan section) Acute Rehab PT Goals Patient Stated Goal: "I would much rather go home." PT Goal Formulation: With patient/family Time For Goal Achievement: 05/09/15 Potential to Achieve Goals: Fair Progress towards PT goals:  Progressing toward goals    Frequency  Min 2X/week    PT Plan Current plan remains appropriate    Co-evaluation             End of Session Equipment Utilized During Treatment: Gait belt Activity Tolerance: Patient tolerated treatment well       Time: 7414-2395 PT Time Calculation (min) (ACUTE ONLY): 24 min  Charges:  $Therapeutic Exercise: 8-22 mins                    G Codes:      Bernestine Amass, SPT 2015/05/10 4:21 PM

## 2015-04-25 NOTE — Evaluation (Signed)
Physical Therapy Evaluation Patient Details Name: [name] MRN: [id number] DOB: 03-17-1946 Today's Date: 04/25/2015   History of Present Illness  s/p R humerus ORIF  Clinical Impression  Pt shows good effort and motivation t/o session.  She has great support from her husband and they do have a CNA to help 5 days a week.  Husband has been able to arrange a bed room on the main floor so she will not need to do stairs.  Pt shows good effort with LE exercises X 10 minutes apart from eval, but does have longstanding neuropathy and coordination issues and struggles with minimal ambulation with hemiwalker.     Follow Up Recommendations Home health PT    Equipment Recommendations   (may need a hemiwalker)    Recommendations for Other Services       Precautions / Restrictions Precautions Precautions: Fall Restrictions Weight Bearing Restrictions:  (R UE in immobilizer)      Mobility  Bed Mobility Overal bed mobility: Needs Assistance Bed Mobility: Supine to Sit     Supine to sit: Min assist        Transfers Overall transfer level: Needs assistance Equipment used: Hemi-walker Transfers: Sit to/from Stand Sit to Stand: Min assist            Ambulation/Gait Ambulation/Gait assistance: Mod assist Ambulation Distance (Feet): 5 Feet Assistive device: Hemi-walker       General Gait Details: Pt with significant coordination issues with b/l LE placement/control   Stairs            Wheelchair Mobility    [name] (Stroke Patients Only)       Balance                                             Pertinent Vitals/Pain Pain Assessment: 0-10 Pain Score: 5     Home Living Family/patient expects to be discharged to:: Private residence Living Arrangements: Spouse/significant other Available Help at Discharge: Family Type of Home: House Home Access: Ramped entrance (husband has set up a bedroom on the first floor)              Prior Function Level of Independence: Needs assistance   Gait / Transfers Assistance Needed: husband would be with her for almost all standing/walking, has been scooting down the stairs on her bottom           Hand Dominance        Extremity/Trunk Assessment   Upper Extremity Assessment:  (R UE not tested, L grossly functional)           Lower Extremity Assessment:  (grossly 3+/5, has significant sensation & coordination issue)         Communication   Communication: No difficulties  Cognition Arousal/Alertness: Awake/alert Behavior During Therapy: WFL for tasks assessed/performed Overall Cognitive Status: Within Functional Limits for tasks assessed                      General Comments      Exercises General Exercises - Lower Extremity Ankle Circles/Pumps: AROM;10 reps Heel Slides: AROM;10 reps Hip ABduction/ADduction: AROM;10 reps      Assessment/Plan    PT Assessment    PT Diagnosis     PT Problem List    PT Treatment Interventions     PT Goals (Current goals can be found in  the Care Plan section) Acute Rehab PT Goals Patient Stated Goal: "I would much rather go home." PT Goal Formulation: With patient/family Time For Goal Achievement: 05/09/15 Potential to Achieve Goals: Fair    Frequency     Barriers to discharge        Co-evaluation               End of Session Equipment Utilized During Treatment: Gait belt Activity Tolerance: Patient tolerated treatment well             Time: 1140-1205 PT Time Calculation (min) (ACUTE ONLY): 25 min   Charges:   PT Evaluation $Initial PT Evaluation Tier I: 1 Procedure PT Treatments $Therapeutic Exercise: 8-22 mins   PT G Codes:       [name], PT, DPT [phone]  [name] 04/25/2015, 3:12 PM

## 2015-04-26 LAB — BASIC METABOLIC PANEL
Anion gap: 5 (ref 5–15)
CALCIUM: 8.8 mg/dL — AB (ref 8.9–10.3)
CO2: 22 mmol/L (ref 22–32)
CREATININE: 0.48 mg/dL (ref 0.44–1.00)
Chloride: 107 mmol/L (ref 101–111)
GLUCOSE: 128 mg/dL — AB (ref 65–99)
Potassium: 3.5 mmol/L (ref 3.5–5.1)
Sodium: 134 mmol/L — ABNORMAL LOW (ref 135–145)

## 2015-04-26 MED ORDER — OXYCODONE HCL 5 MG PO TABS: 5.0000 mg | ORAL_TABLET | ORAL | Status: DC | PRN

## 2015-04-26 NOTE — Care Management Note (Addendum)
Case Management Note  Patient Details  Name: [name] MRN: [id number] Date of Birth: 30-Apr-1946  Subjective/Objective:      Discussed discharge planning home health providers with husband Jocelyn Nold and wife Everlean. Mr Rockman chose [facility] as wife's PT provider. Discussed home equipment, including a hemiwalker and Mr Lofaso reports that he does not think his wife will need any additional equipment per they have several rolling walkers at home and a wheelchair. PT had Mrs Ghan use a hemiwalker earlier this week. Mr Palau stated that if he and Mrs Cisse think that a hemiwalker would be beneficial that he would get one from the [place] office across the street from [facility].  All discharge information and a request for home health PT was faxed and called to [facility].              Action/Plan:   Expected Discharge Date:  04/26/15               Expected Discharge Plan:     In-House Referral:     Discharge planning Services  CM Consult  Post Acute Care Choice:  Home Health, Durable Medical Equipment Choice offered to:  Patient, Spouse  DME Arranged:    DME Agency:     HH Arranged:    [place] Agency:     Status of Service:  In process, will continue to follow  Medicare Important Message Given:    Date Medicare IM Given:    Medicare IM give by:    Date Additional Medicare IM Given:    Additional Medicare Important Message give by:     If discussed at [place] of Stay Meetings, dates discussed:    Additional Comments:  Karielle Davidow A, RN 04/26/2015, 9:11 AM

## 2015-04-26 NOTE — Progress Notes (Signed)
  Subjective: 2 Days Post-Op Procedure(s) (LRB): OPEN REDUCTION INTERNAL FIXATION (ORIF) PROXIMAL HUMERUS FRACTURE (Right) Patient reports pain as 4 on 0-10 scale.   Patient is well, and has had no acute complaints or problems Plan is to go home with homehealth PT. after hospital stay. Negative for chest pain and shortness of breath Fever: no Gastrointestinal:Negative for nausea and vomiting  Objective: Vital signs in last 24 hours: Temp:  [97.8 F (36.6 C)-98.9 F (37.2 C)] 98.2 F (36.8 C) (08/13 0719) Pulse Rate:  [73-82] 82 (08/13 0719) Resp:  [16-18] 16 (08/13 0719) BP: (111-131)/(53-71) 114/71 mmHg (08/13 0719) SpO2:  [96 %-99 %] 99 % (08/13 0719)  Intake/Output from previous day:  Intake/Output Summary (Last 24 hours) at 04/26/15 0755 Last data filed at 04/25/15 1300  Gross per 24 hour  Intake    480 ml  Output      0 ml  Net    480 ml    Intake/Output this shift:    Labs:  Recent Labs  04/25/15 0343  HGB 11.5*    Recent Labs  04/25/15 0343  WBC 11.0  RBC 3.29*  HCT 33.5*  PLT 234    Recent Labs  04/25/15 0343 04/25/15 1221 04/26/15 0324  NA 136  --  134*  K 3.0* 3.0* 3.5  CL 107  --  107  CO2 22  --  22  BUN 6  --  <5*  CREATININE 0.57  --  0.48  GLUCOSE 153*  --  128*  CALCIUM 8.6*  --  8.8*   No results for input(s): LABPT, INR in the last 72 hours.   EXAM General - Patient is Alert, Appropriate and Oriented Extremity - Neurologically intact ABD soft Sensation intact distally Dorsiflexion/Plantar flexion intact Incision: scant drainage No cellulitis present Dressing/Incision - blood tinged drainage Motor Function - intact, moving foot and toes well on exam.   Abdomen is soft, non-tender and non-distended  Past Medical History  Diagnosis Date  . Diabetes mellitus without complication   . Hypertension   . Neuropathy   . GERD (gastroesophageal reflux disease)   . Hypothyroidism   . Breast cancer     breast-left  . Bed  sore on heel   . Bed sore on elbow     Assessment/Plan: 2 Days Post-Op Procedure(s) (LRB): OPEN REDUCTION INTERNAL FIXATION (ORIF) PROXIMAL HUMERUS FRACTURE (Right) Active Problems:   Fracture, humerus, proximal  Estimated body mass index is 19.8 kg/(m^2) as calculated from the following:   Height as of this encounter: 5\' 5"  (1.651 m).   Weight as of this encounter: 53.978 kg (119 lb). Advance diet Discharge home with home health  DVT Prophylaxis - Lovenox, Foot Pumps and TED hose No lifting with the right upper extremity.  Raquel Taisley Mordan, PA-C [facility] 04/26/2015, 7:55 AM

## 2015-04-28 NOTE — Anesthesia Postprocedure Evaluation (Signed)
  Anesthesia Post-op Note  Patient: [name]  Procedure(s) Performed: Procedure(s): OPEN REDUCTION INTERNAL FIXATION (ORIF) PROXIMAL HUMERUS FRACTURE (Right)  Anesthesia type:General  Patient location: PACU  Post pain: Pain level controlled  Post assessment: Post-op Vital signs reviewed, Patient's Cardiovascular Status Stable, Respiratory Function Stable, Patent Airway and No signs of Nausea or vomiting  Post vital signs: Reviewed and stable  Last Vitals:  Filed Vitals:   04/26/15 0719  BP: 114/71  Pulse: 82  Temp: 36.8 C  Resp: 16    Level of consciousness: awake, alert  and patient cooperative  Complications: No apparent anesthesia complications

## 2015-05-02 ENCOUNTER — Encounter: Payer: Medicare Other | Admitting: Surgery

## 2015-05-02 DIAGNOSIS — L89613 Pressure ulcer of right heel, stage 3: Secondary | ICD-10-CM | POA: Diagnosis not present

## 2015-05-02 DIAGNOSIS — F17218 Nicotine dependence, cigarettes, with other nicotine-induced disorders: Secondary | ICD-10-CM | POA: Diagnosis not present

## 2015-05-02 DIAGNOSIS — I10 Essential (primary) hypertension: Secondary | ICD-10-CM | POA: Diagnosis not present

## 2015-05-02 DIAGNOSIS — E11621 Type 2 diabetes mellitus with foot ulcer: Secondary | ICD-10-CM | POA: Diagnosis not present

## 2015-05-02 DIAGNOSIS — L89013 Pressure ulcer of right elbow, stage 3: Secondary | ICD-10-CM | POA: Diagnosis not present

## 2015-05-03 NOTE — Progress Notes (Addendum)
[name], [name] ([id number]) Visit Report for 05/02/2015 Chief Complaint Document Details Patient Name: [name], [name] Date of Service: 05/02/2015 2:15 PM Medical Record Number: [id number] Patient Account Number: [id number] Date of Birth/Sex: 12-12-1945 (69 y.o. Female) Treating RN: Montey Hora Primary Care Physician: Paulita Cradle Other Clinician: Referring Physician: Paulita Cradle Treating Physician/Extender: Frann Rider in Treatment: 7 Information Obtained from: Patient Chief Complaint Patient presents to the wound care center for a consult due non healing wound. Ulcers on the right elbow and the right heel for about 1 month. Electronic Signature(s) Signed: 05/02/2015 4:25:01 PM By: Christin Fudge MD, FACS Entered By: Christin Fudge on 05/02/2015 14:56:09 [name], [name] ([id number]) -------------------------------------------------------------------------------- HPI Details Patient Name: [name], [name] Date of Service: 05/02/2015 2:15 PM Medical Record Number: [id number] Patient Account Number: [id number] Date of Birth/Sex: May 18, 1946 (69 y.o. Female) Treating RN: Montey Hora Primary Care Physician: Paulita Cradle Other Clinician: Referring Physician: Paulita Cradle Treating Physician/Extender: Frann Rider in Treatment: 7 History of Present Illness Location: Ulceration on the right heel and the right elbow. Quality: Patient reports experiencing a dull pain to affected area(s). Severity: Patient states wound (s) are getting better. Duration: Patient has had the wound for < 4 weeks prior to presenting for treatment Timing: Pain in wound is Intermittent (comes and goes Context: The wound appeared gradually over time Modifying Factors: Consults to this date include:Augmentin and Bactrim and also some heel protection with duoderm Associated Signs and Symptoms: Patient reports having difficulty standing for long periods. HPI Description:  69 year old female with history of peripheral neuropathy, history of diet controlled diabetes mellitus type 2, history of alcoholism here for wound consult sent by her PCP Dr. Sherilyn Cooter. She has pressure ulcers at her right elbow, bilateral heels. Plain films of right calcaneus without acute bony process. Patient started by PCP on Augmentin, Bactrim as per orders, DuoDerm dressings applied - reports some improvement in her ulcer since last seen. Denies fever, chills, nausea, vomiting, diarrhea. She had a right humerus fracture in the middle of May and has had no surgery and arm is in a sling. She is also been laying in the bed for quite a while. Past medical history significant for essential hypertension, osteoporosis, peripheral neuropathy, alcoholism, ataxia, personal history of breast cancer treated with surgery chemotherapy and radiation and this was done in December 2010. she is also status post laparoscopic cholecystectomy, pilonidal cyst excision, subcutaneous port placement, partial mastectomy on the left side, skin cancer removal. 03/21/2015 -- she says overall she's been doing better and continues to smoke about 15 cigarettes a day. 03/21/2015 - her orthopedic doctor has said she may require surgery for her right humerus fracture. 04/04/2015 -- her orthopedic surgery has been scheduled for August 11. 04/18/2015 -- she is doing fine as far as her elbow and her right heel goes but she has developed some redness over prominence on her thoracic spine and wanted me to take a look at this. 05/02/2015 -- she had her surgery done and now is in a sling and support. Her back has developed a pressure injury of undetermined stage. She seems to be in better spirits. Electronic Signature(s) Signed: 05/02/2015 4:25:01 PM By: Christin Fudge MD, FACS Entered By: Christin Fudge on 05/02/2015 14:56:48 [name], [name]  ([id number]) -------------------------------------------------------------------------------- Physical Exam Details Patient Name: [name], [name] Date of Service: 05/02/2015 2:15 PM Medical Record Number: [id number] Patient Account Number: [id number] Date of Birth/Sex: [birth year] (69 y.o. Female) Treating RN: Montey Hora Primary Care Physician: Carrie Mew, MIRIAM  Other Clinician: Referring Physician: Carrie Mew, MIRIAM Treating Physician/Extender: Frann Rider in Treatment: 7 Constitutional . Pulse regular. Respirations normal and unlabored. Afebrile. . Eyes Nonicteric. Reactive to light. Ears, Nose, Mouth, and Throat Lips, teeth, and gums WNL.Marland Kitchen Moist mucosa without lesions . Neck supple and nontender. No palpable supraclavicular or cervical adenopathy. Normal sized without goiter. Respiratory WNL. No retractions.. Cardiovascular Pedal Pulses WNL. No clubbing, cyanosis or edema. Chest Breasts symmetical and no nipple discharge.. Breast tissue WNL, no masses, lumps, or tenderness.. Lymphatic No adneopathy. No adenopathy. No adenopathy. Musculoskeletal Adexa without tenderness or enlargement.. Digits and nails w/o clubbing, cyanosis, infection, petechiae, ischemia, or inflammatory conditions.. Integumentary (Hair, Skin) No suspicious lesions. No crepitus or fluctuance. No peri-wound warmth or erythema. No masses.Marland Kitchen Psychiatric Judgement and insight Intact.. No evidence of depression, anxiety, or agitation.. Notes The right heel looks excellent and has a minimal open area. The right elbow has a minimal slough at the base and a little redness around the edges. The area of the thoracic spine which previously was reviewed now has a pressure injury of indeterminate stage and deep tissue injury. Electronic Signature(s) Signed: 05/02/2015 4:25:01 PM By: Christin Fudge MD, FACS Entered By: Christin Fudge on 05/02/2015 14:58:05 [name], [name]  ([id number]) -------------------------------------------------------------------------------- Physician Orders Details Patient Name: [name], [name] Date of Service: 05/02/2015 2:15 PM Medical Record Number: [id number] Patient Account Number: [id number] Date of Birth/Sex: 1945-11-13 (69 y.o. Female) Treating RN: Montey Hora Primary Care Physician: Paulita Cradle Other Clinician: Referring Physician: Paulita Cradle Treating Physician/Extender: Frann Rider in Treatment: 7 Verbal / Phone Orders: Yes Clinician: Montey Hora Read Back and Verified: Yes Diagnosis Coding Wound Cleansing Wound #1 Right Calcaneous o Cleanse wound with mild soap and water o May Shower, gently pat wound dry prior to applying new dressing. o May shower with protection. Wound #2 Right Elbow o Cleanse wound with mild soap and water o May Shower, gently pat wound dry prior to applying new dressing. o May shower with protection. Wound #3 Midline Back o Cleanse wound with mild soap and water o May Shower, gently pat wound dry prior to applying new dressing. o May shower with protection. Primary Wound Dressing Wound #1 Right Calcaneous o Prisma Ag Wound #2 Right Elbow o Aquacel Ag Secondary Dressing Wound #1 Right Calcaneous o Boardered Foam Dressing Wound #2 Right Elbow o Boardered Foam Dressing Wound #3 Midline Back o Boardered Foam Dressing Dressing Change Frequency Wound #1 Right Calcaneous o Change dressing every other day. Kulik, Mayleen H. ([id number]) Wound #2 Right Elbow o Change dressing every other day. Wound #3 Midline Back o Change dressing every other day. Follow-up Appointments Wound #1 Right Calcaneous o Return Appointment in 1 week. Wound #2 Right Elbow o Return Appointment in 1 week. Wound #3 Midline Back o Return Appointment in 1 week. Off-Loading Wound #1 Right Calcaneous o Heel suspension boot to: - sage boots Wound  #3 Midline Back o Turn and reposition every 2 hours Home Health Wound #1 Right [place] Visits - [place] Nurse may visit PRN to address patientos wound care needs. o FACE TO FACE ENCOUNTER: MEDICARE and MEDICAID PATIENTS: I certify that this patient is under my care and that I had a face-to-face encounter that meets the physician face-to-face encounter requirements with this patient on this date. The encounter with the patient was in whole or in part for the following MEDICAL CONDITION: (primary reason for [place]) MEDICAL NECESSITY: I certify, that based on  my findings, NURSING services are a medically necessary home health service. HOME BOUND STATUS: I certify that my clinical findings support that this patient is homebound (i.e., Due to illness or injury, pt requires aid of supportive devices such as crutches, cane, wheelchairs, walkers, the use of special transportation or the assistance of another person to leave their place of residence. There is a normal inability to leave the home and doing so requires considerable and taxing effort. Other absences are for medical reasons / religious services and are infrequent or of short duration when for other reasons). o If current dressing causes regression in wound condition, may D/C ordered dressing product/s and apply Normal Saline Moist Dressing daily until next [place] / Other MD appointment. [place] of regression in wound condition at [phone]. o Please direct any NON-WOUND related issues/requests for orders to patient's Primary Care Physician Wound #2 Right Elbow o [place] Visits - Mima Cranmore, Neelah H. ([id number]) o [place] Nurse may visit PRN to address patientos wound care needs. o FACE TO FACE ENCOUNTER: MEDICARE and MEDICAID PATIENTS: I certify that this patient is under my care and that I had a face-to-face  encounter that meets the physician face-to-face encounter requirements with this patient on this date. The encounter with the patient was in whole or in part for the following MEDICAL CONDITION: (primary reason for [place]) MEDICAL NECESSITY: I certify, that based on my findings, NURSING services are a medically necessary home health service. HOME BOUND STATUS: I certify that my clinical findings support that this patient is homebound (i.e., Due to illness or injury, pt requires aid of supportive devices such as crutches, cane, wheelchairs, walkers, the use of special transportation or the assistance of another person to leave their place of residence. There is a normal inability to leave the home and doing so requires considerable and taxing effort. Other absences are for medical reasons / religious services and are infrequent or of short duration when for other reasons). o If current dressing causes regression in wound condition, may D/C ordered dressing product/s and apply Normal Saline Moist Dressing daily until next [place] / Other MD appointment. [place] of regression in wound condition at [phone]. o Please direct any NON-WOUND related issues/requests for orders to patient's Primary Care Physician Wound #3 Midline Back o [place] Visits - [place] Nurse may visit PRN to address patientos wound care needs. o FACE TO FACE ENCOUNTER: MEDICARE and MEDICAID PATIENTS: I certify that this patient is under my care and that I had a face-to-face encounter that meets the physician face-to-face encounter requirements with this patient on this date. The encounter with the patient was in whole or in part for the following MEDICAL CONDITION: (primary reason for [place]) MEDICAL NECESSITY: I certify, that based on my findings, NURSING services are a medically necessary home health service. HOME BOUND STATUS: I  certify that my clinical findings support that this patient is homebound (i.e., Due to illness or injury, pt requires aid of supportive devices such as crutches, cane, wheelchairs, walkers, the use of special transportation or the assistance of another person to leave their place of residence. There is a normal inability to leave the home and doing so requires considerable and taxing effort. Other absences are for medical reasons / religious services and are infrequent or of short duration when for other reasons). o If current dressing causes regression in wound condition, may  D/C ordered dressing product/s and apply Normal Saline Moist Dressing daily until next [place] / Other MD appointment. [place] of regression in wound condition at [phone]. o Please direct any NON-WOUND related issues/requests for orders to patient's Primary Care Physician Electronic Signature(s) Signed: 05/02/2015 3:10:16 PM By: Montey Hora Signed: 05/02/2015 4:25:01 PM By: Christin Fudge MD, FACS Entered By: Montey Hora on 05/02/2015 15:10:16 [place], Kennita H. ([id number]) -------------------------------------------------------------------------------- Problem List Details Patient Name: Haile, Amel H. Date of Service: 05/02/2015 2:15 PM Medical Record Number: [id number] Patient Account Number: [id number] Date of Birth/Sex: 1946-07-24 (69 y.o. Female) Treating RN: Montey Hora Primary Care Physician: Paulita Cradle Other Clinician: Referring Physician: Paulita Cradle Treating Physician/Extender: Frann Rider in Treatment: 7 Active Problems ICD-10 Encounter Code Description Active Date Diagnosis E11.621 Type 2 diabetes mellitus with foot ulcer 03/13/2015 Yes L89.613 Pressure ulcer of right heel, stage 3 03/13/2015 Yes L89.013 Pressure ulcer of right elbow, stage 3 03/13/2015 Yes F17.218 Nicotine dependence, cigarettes, with other nicotine- 03/13/2015  Yes induced disorders L89.100 Pressure ulcer of unspecified part of back, unstageable 05/02/2015 Yes Inactive Problems Resolved Problems Electronic Signature(s) Signed: 05/02/2015 4:25:01 PM By: Christin Fudge MD, FACS Entered By: Christin Fudge on 05/02/2015 14:55:56 [name], [name] ([id number]) -------------------------------------------------------------------------------- Progress Note Details Patient Name: [name], [name] Date of Service: 05/02/2015 2:15 PM Medical Record Number: [id number] Patient Account Number: [id number] Date of Birth/Sex: 1946/05/27 (69 y.o. Female) Treating RN: Montey Hora Primary Care Physician: Paulita Cradle Other Clinician: Referring Physician: Paulita Cradle Treating Physician/Extender: Frann Rider in Treatment: 7 Subjective Chief Complaint Information obtained from Patient Patient presents to the wound care center for a consult due non healing wound. Ulcers on the right elbow and the right heel for about 1 month. History of Present Illness (HPI) The following HPI elements were documented for the patient's wound: Location: Ulceration on the right heel and the right elbow. Quality: Patient reports experiencing a dull pain to affected area(s). Severity: Patient states wound (s) are getting better. Duration: Patient has had the wound for < 4 weeks prior to presenting for treatment Timing: Pain in wound is Intermittent (comes and goes Context: The wound appeared gradually over time Modifying Factors: Consults to this date include:Augmentin and Bactrim and also some heel protection with duoderm Associated Signs and Symptoms: Patient reports having difficulty standing for long periods. 69 year old female with history of peripheral neuropathy, history of diet controlled diabetes mellitus type 2, history of alcoholism here for wound consult sent by her PCP Dr. Sherilyn Cooter. She has pressure ulcers at her right elbow, bilateral heels. Plain films of  right calcaneus without acute bony process. Patient started by PCP on Augmentin, Bactrim as per orders, DuoDerm dressings applied - reports some improvement in her ulcer since last seen. Denies fever, chills, nausea, vomiting, diarrhea. She had a right humerus fracture in the middle of May and has had no surgery and arm is in a sling. She is also been laying in the bed for quite a while. Past medical history significant for essential hypertension, osteoporosis, peripheral neuropathy, alcoholism, ataxia, personal history of breast cancer treated with surgery chemotherapy and radiation and this was done in December 2010. she is also status post laparoscopic cholecystectomy, pilonidal cyst excision, subcutaneous port placement, partial mastectomy on the left side, skin cancer removal. 03/21/2015 -- she says overall she's been doing better and continues to smoke about 15 cigarettes a day. 03/21/2015 - her orthopedic doctor has said she may require surgery for her right humerus  fracture. 04/04/2015 -- her orthopedic surgery has been scheduled for August 11. 04/18/2015 -- she is doing fine as far as her elbow and her right heel goes but she has developed some redness over prominence on her thoracic spine and wanted me to take a look at this. Ponti, Laquiesha H. ([id number]) 05/02/2015 -- she had her surgery done and now is in a sling and support. Her back has developed a pressure injury of undetermined stage. She seems to be in better spirits. Objective Constitutional Pulse regular. Respirations normal and unlabored. Afebrile. Vitals Time Taken: 2:24 PM, Height: 65 in, Weight: 118 lbs, BMI: 19.6, Temperature: 98.1 F, Pulse: 107 bpm, Respiratory Rate: 16 breaths/min, Blood Pressure: 103/67 mmHg. Eyes Nonicteric. Reactive to light. Ears, Nose, Mouth, and Throat Lips, teeth, and gums WNL.Marland Kitchen Moist mucosa without lesions . Neck supple and nontender. No palpable supraclavicular or cervical adenopathy.  Normal sized without goiter. Respiratory WNL. No retractions.. Cardiovascular Pedal Pulses WNL. No clubbing, cyanosis or edema. Chest Breasts symmetical and no nipple discharge.. Breast tissue WNL, no masses, lumps, or tenderness.. Lymphatic No adneopathy. No adenopathy. No adenopathy. Musculoskeletal Adexa without tenderness or enlargement.. Digits and nails w/o clubbing, cyanosis, infection, petechiae, ischemia, or inflammatory conditions.Marland Kitchen Psychiatric Judgement and insight Intact.. No evidence of depression, anxiety, or agitation.. General Notes: The right heel looks excellent and has a minimal open area. The right elbow has a minimal slough at the base and a little redness around the edges. The area of the thoracic spine which previously was reviewed now has a pressure injury of indeterminate stage and deep tissue injury. [name], [name] ([id number]) Integumentary (Hair, Skin) No suspicious lesions. No crepitus or fluctuance. No peri-wound warmth or erythema. No masses.. Wound #1 status is Open. Original cause of wound was Gradually Appeared. The wound is located on the Right Calcaneous. The wound measures 0.4cm length x 0.4cm width x 0.1cm depth; 0.126cm^2 area and 0.013cm^3 volume. The wound is limited to skin breakdown. There is no tunneling or undermining noted. There is a none present amount of drainage noted. The wound margin is distinct with the outline attached to the wound base. There is medium (34-66%) pink, pale granulation within the wound bed. There is a medium (34-66%) amount of necrotic tissue within the wound bed including Adherent Slough. The periwound skin appearance exhibited: Localized Edema, Moist. The periwound skin appearance did not exhibit: Callus, Crepitus, Excoriation, Fluctuance, Friable, Induration, Rash, Scarring, Dry/Scaly, Maceration, Atrophie Blanche, Cyanosis, Ecchymosis, Hemosiderin Staining, Mottled, Pallor, Rubor, Erythema. Periwound temperature  was noted as No Abnormality. The periwound has tenderness on palpation. Wound #2 status is Open. Original cause of wound was Gradually Appeared. The wound is located on the Right Elbow. The wound measures 1.2cm length x 1cm width x 0.2cm depth; 0.942cm^2 area and 0.188cm^3 volume. The wound is limited to skin breakdown. There is no tunneling or undermining noted. There is a medium amount of serosanguineous drainage noted. The wound margin is distinct with the outline attached to the wound base. There is no granulation within the wound bed. There is a large (67- 100%) amount of necrotic tissue within the wound bed including Eschar. The periwound skin appearance exhibited: Dry/Scaly. The periwound skin appearance did not exhibit: Callus, Crepitus, Excoriation, Fluctuance, Friable, Induration, Localized Edema, Rash, Scarring, Maceration, Moist, Atrophie Blanche, Cyanosis, Ecchymosis, Hemosiderin Staining, Mottled, Pallor, Rubor, Erythema. Periwound temperature was noted as No Abnormality. Wound #3 status is Open. Original cause of wound was Pressure Injury. The wound is located on the Midline Back.  The wound measures 0.7cm length x 0.4cm width x 0.1cm depth; 0.22cm^2 area and 0.022cm^3 volume. The wound is limited to skin breakdown. There is no tunneling or undermining noted. There is a small amount of serous drainage noted. The wound margin is flat and intact. There is large (67- 100%) red granulation within the wound bed. There is no necrotic tissue within the wound bed. The periwound skin appearance did not exhibit: Callus, Crepitus, Excoriation, Fluctuance, Friable, Induration, Localized Edema, Rash, Scarring, Dry/Scaly, Maceration, Moist, Atrophie Blanche, Cyanosis, Ecchymosis, Hemosiderin Staining, Mottled, Pallor, Rubor, Erythema. Periwound temperature was noted as No Abnormality. The periwound has tenderness on palpation. Assessment Active Problems ICD-10 E11.621 - Type 2 diabetes  mellitus with foot ulcer L89.613 - Pressure ulcer of right heel, stage 3 L89.013 - Pressure ulcer of right elbow, stage 3 F17.218 - Nicotine dependence, cigarettes, with other nicotine-induced disorders L89.100 - Pressure ulcer of unspecified part of back, unstageable [name], [name] ([id number]) The right heel looks excellent and has a minimal open area. We will use Prisma Ag over this. The right elbow has a minimal slough at the base and a little redness around the edges. We will use silver alginate over this. The area of the thoracic spine which previously was reviewed now has a pressure injury of indeterminate stage and deep tissue injury. I have recommended a bordered foam dressing for protection and they will change this as required. Offloading, nutrition and smoking cessation has been discussed with her in detail. She will come back and see me next week. Plan Wound Cleansing: Wound #1 Right Calcaneous: Cleanse wound with mild soap and water May Shower, gently pat wound dry prior to applying new dressing. May shower with protection. Wound #2 Right Elbow: Cleanse wound with mild soap and water May Shower, gently pat wound dry prior to applying new dressing. May shower with protection. Wound #3 Midline Back: Cleanse wound with mild soap and water May Shower, gently pat wound dry prior to applying new dressing. May shower with protection. Primary Wound Dressing: Wound #1 Right Calcaneous: Prisma Ag Wound #2 Right Elbow: Aquacel Ag Secondary Dressing: Wound #1 Right Calcaneous: Boardered Foam Dressing Wound #2 Right Elbow: Boardered Foam Dressing Wound #3 Midline Back: Boardered Foam Dressing Dressing Change Frequency: Wound #1 Right Calcaneous: Change dressing every other day. Meinhardt, Camron H. ([id number]) Wound #2 Right Elbow: Change dressing every other day. Wound #3 Midline Back: Change dressing every other day. Follow-up Appointments: Wound #1 Right  Calcaneous: Return Appointment in 1 week. Wound #2 Right Elbow: Return Appointment in 1 week. Wound #3 Midline Back: Return Appointment in 1 week. Off-Loading: Wound #1 Right Calcaneous: Heel suspension boot to: - sage boots Wound #3 Midline Back: Turn and reposition every 2 hours Home Health: Wound #1 Right Calcaneous: Continue Home Health Visits - [facility] Nurse may visit PRN to address patient s wound care needs. FACE TO FACE ENCOUNTER: MEDICARE and MEDICAID PATIENTS: I certify that this patient is under my care and that I had a face-to-face encounter that meets the physician face-to-face encounter requirements with this patient on this date. The encounter with the patient was in whole or in part for the following MEDICAL CONDITION: (primary reason for [place]) MEDICAL NECESSITY: I certify, that based on my findings, NURSING services are a medically necessary home health service. HOME BOUND STATUS: I certify that my clinical findings support that this patient is homebound (i.e., Due to illness or injury, pt requires aid of supportive devices such as  crutches, cane, wheelchairs, walkers, the use of special transportation or the assistance of another person to leave their place of residence. There is a normal inability to leave the home and doing so requires considerable and taxing effort. Other absences are for medical reasons / religious services and are infrequent or of short duration when for other reasons). If current dressing causes regression in wound condition, may D/C ordered dressing product/s and apply Normal Saline Moist Dressing daily until next [place] / Other MD appointment. [place] of regression in wound condition at [phone]. Please direct any NON-WOUND related issues/requests for orders to patient's Primary Care Physician Wound #2 Right Elbow: [place] Visits - [facility] Nurse may visit PRN  to address patient s wound care needs. FACE TO FACE ENCOUNTER: MEDICARE and MEDICAID PATIENTS: I certify that this patient is under my care and that I had a face-to-face encounter that meets the physician face-to-face encounter requirements with this patient on this date. The encounter with the patient was in whole or in part for the following MEDICAL CONDITION: (primary reason for [place]) MEDICAL NECESSITY: I certify, that based on my findings, NURSING services are a medically necessary home health service. HOME BOUND STATUS: I certify that my clinical findings support that this patient is homebound (i.e., Due to illness or injury, pt requires aid of supportive devices such as crutches, cane, wheelchairs, walkers, the use of special transportation or the assistance of another person to leave their place of residence. There is a normal inability to leave the home and doing so requires considerable and taxing effort. Other absences are for medical reasons / religious services and are infrequent or of short duration when for other reasons). If current dressing causes regression in wound condition, may D/C ordered dressing product/s and apply Normal Saline Moist Dressing daily until next [place] / Other MD appointment. [place] of regression in wound condition at [phone]. Please direct any NON-WOUND related issues/requests for orders to patient's Primary Care Physician [place], Afomia HMarland Kitchen ([id number]) Wound #3 Midline Back: [place] Visits - [facility] Nurse may visit PRN to address patient s wound care needs. FACE TO FACE ENCOUNTER: MEDICARE and MEDICAID PATIENTS: I certify that this patient is under my care and that I had a face-to-face encounter that meets the physician face-to-face encounter requirements with this patient on this date. The encounter with the patient was in whole or in part for the following MEDICAL CONDITION:  (primary reason for [place]) MEDICAL NECESSITY: I certify, that based on my findings, NURSING services are a medically necessary home health service. HOME BOUND STATUS: I certify that my clinical findings support that this patient is homebound (i.e., Due to illness or injury, pt requires aid of supportive devices such as crutches, cane, wheelchairs, walkers, the use of special transportation or the assistance of another person to leave their place of residence. There is a normal inability to leave the home and doing so requires considerable and taxing effort. Other absences are for medical reasons / religious services and are infrequent or of short duration when for other reasons). If current dressing causes regression in wound condition, may D/C ordered dressing product/s and apply Normal Saline Moist Dressing daily until next [place] / Other MD appointment. [place] of regression in wound condition at [phone]. Please direct any NON-WOUND related issues/requests for orders to patient's Primary Care Physician The right heel looks excellent  and has a minimal open area. We will use Prisma Ag over this. The right elbow has a minimal slough at the base and a little redness around the edges. We will use silver alginate over this. The area of the thoracic spine which previously was reviewed now has a pressure injury of indeterminate stage and deep tissue injury. I have recommended a bordered foam dressing for protection and they will change this as required. Offloading, nutrition and smoking cessation has been discussed with her in detail. She will come back and see me next week. Electronic Signature(s) Signed: 05/05/2015 8:28:35 AM By: Christin Fudge MD, FACS Previous Signature: 05/02/2015 4:25:01 PM Version By: Christin Fudge MD, FACS Entered By: Christin Fudge on 05/05/2015 08:27:30 Carrasco, Lucillie H.  ([id number]) -------------------------------------------------------------------------------- SuperBill Details Patient Name: [name], Dashanae H. Date of Service: 05/02/2015 Medical Record Number: [id number] Patient Account Number: [id number] Date of Birth/Sex: 1945-09-14 (69 y.o. 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, stage 3 F17.218 Nicotine dependence, cigarettes, with other nicotine-induced disorders L89.100 Pressure ulcer of unspecified part of back, unstageable Facility Procedures CPT4 Code: 17616073 Description: 99214 - WOUND CARE VISIT-LEV 4 EST PT Modifier: Quantity: 1 Physician Procedures CPT4 Code: 7106269 Description: 48546 - WC PHYS LEVEL 3 - EST PT ICD-10 Description Diagnosis 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

## 2015-05-03 NOTE — Progress Notes (Signed)
GRIFFEY, Jyrah H. ([id number]) Visit Report for 05/02/2015 Arrival Information Details Patient Name: KOENEN, Ileah H. Date of Service: 05/02/2015 2:15 PM Medical Record Number: [id number] Patient Account Number: [id number] Date of Birth/Sex: 1946/05/15 (69 y.o. Female) Treating RN: Montey Hora Primary Care Physician: Paulita Cradle Other Clinician: Referring Physician: Paulita Cradle Treating Physician/Extender: Frann Rider in Treatment: 7 Visit Information History Since Last Visit Added or deleted any medications: No Patient Arrived: Wheel Chair Any new allergies or adverse reactions: No Arrival Time: 14:23 Had a fall or experienced change in No activities of daily living that may affect Accompanied By: spouse risk of falls: Transfer Assistance: Manual Signs or symptoms of abuse/neglect since last No Patient Identification Verified: Yes visito Secondary Verification Process Yes Hospitalized since last visit: No Completed: Pain Present Now: Yes Patient Requires Transmission-Based No Precautions: Patient Has Alerts: No Electronic Signature(s) Signed: 05/02/2015 4:58:40 PM By: Montey Hora Entered By: Montey Hora on 05/02/2015 14:23:52 Gagliano, Denishia H. ([id number]) -------------------------------------------------------------------------------- Clinic Level of Care Assessment Details Patient Name: Stillings, Donye H. Date of Service: 05/02/2015 2:15 PM Medical Record Number: [id number] Patient Account Number: [id number] Date of Birth/Sex: [birth year] (69 y.o. Female) Treating RN: Montey Hora Primary Care Physician: Paulita Cradle Other Clinician: Referring Physician: Paulita Cradle Treating Physician/Extender: Frann Rider in Treatment: 7 Clinic Level of Care Assessment Items TOOL 4 Quantity Score []  - Use when only an EandM is performed on FOLLOW-UP visit 0 ASSESSMENTS - Nursing Assessment / Reassessment X - Reassessment of Co-morbidities  (includes updates in patient status) 1 10 X - Reassessment of Adherence to Treatment Plan 1 5 ASSESSMENTS - Wound and Skin Assessment / Reassessment []  - Simple Wound Assessment / Reassessment - one wound 0 X - Complex Wound Assessment / Reassessment - multiple wounds 3 5 []  - Dermatologic / Skin Assessment (not related to wound area) 0 ASSESSMENTS - Focused Assessment []  - Circumferential Edema Measurements - multi extremities 0 []  - Nutritional Assessment / Counseling / Intervention 0 X - Lower Extremity Assessment (monofilament, tuning fork, pulses) 1 5 []  - Peripheral Arterial Disease Assessment (using hand held doppler) 0 ASSESSMENTS - Ostomy and/or Continence Assessment and Care []  - Incontinence Assessment and Management 0 []  - Ostomy Care Assessment and Management (repouching, etc.) 0 PROCESS - Coordination of Care X - Simple Patient / Family Education for ongoing care 1 15 []  - Complex (extensive) Patient / Family Education for ongoing care 0 []  - Staff obtains Programmer, systems, Records, Test Results / Process Orders 0 []  - Staff telephones HHA, Nursing Homes / Clarify orders / etc 0 []  - Routine Transfer to another Facility (non-emergent condition) 0 Adorno, Shenell H. ([id number]) []  - Routine Hospital Admission (non-emergent condition) 0 []  - New Admissions / Biomedical engineer / Ordering NPWT, Apligraf, etc. 0 []  - Emergency Hospital Admission (emergent condition) 0 X - Simple Discharge Coordination 1 10 []  - Complex (extensive) Discharge Coordination 0 PROCESS - Special Needs []  - Pediatric / Minor Patient Management 0 []  - Isolation Patient Management 0 []  - Hearing / Language / Visual special needs 0 []  - Assessment of Community assistance (transportation, D/C planning, etc.) 0 []  - Additional assistance / Altered mentation 0 []  - Support Surface(s) Assessment (bed, cushion, seat, etc.) 0 INTERVENTIONS - Wound Cleansing / Measurement []  - Simple Wound Cleansing - one wound  0 X - Complex Wound Cleansing - multiple wounds 3 5 X - Wound Imaging (photographs - any number of wounds) 1 5 []  - Wound Tracing (instead of photographs) 0 []  -  Simple Wound Measurement - one wound 0 X - Complex Wound Measurement - multiple wounds 3 5 INTERVENTIONS - Wound Dressings X - Small Wound Dressing one or multiple wounds 3 10 []  - Medium Wound Dressing one or multiple wounds 0 []  - Large Wound Dressing one or multiple wounds 0 []  - Application of Medications - topical 0 []  - Application of Medications - injection 0 INTERVENTIONS - Miscellaneous []  - External ear exam 0 Morga, Gracemarie H. ([id number]) []  - Specimen Collection (cultures, biopsies, blood, body fluids, etc.) 0 []  - Specimen(s) / Culture(s) sent or taken to Lab for analysis 0 []  - Patient Transfer (multiple staff / [name] / Similar devices) 0 []  - Simple Staple / Suture removal (25 or less) 0 []  - Complex Staple / Suture removal (26 or more) 0 []  - Hypo / Hyperglycemic Management (close monitor of Blood Glucose) 0 []  - Ankle / Brachial Index (ABI) - do not check if billed separately 0 X - Vital Signs 1 5 Has the patient been seen at the hospital within the last three years: Yes Total Score: 130 Level Of Care: New/Established - Level 4 Electronic Signature(s) Signed: 05/02/2015 4:41:04 PM By: Montey Hora Entered By: Montey Hora on 05/02/2015 16:41:02 Suhre, Avarose H. ([id number]) -------------------------------------------------------------------------------- Encounter Discharge Information Details Patient Name: [name], Idaliz H. Date of Service: 05/02/2015 2:15 PM Medical Record Number: [id number] Patient Account Number: [id number] Date of Birth/Sex: 01-19-1946 (69 y.o. Female) Treating RN: Montey Hora Primary Care Physician: Paulita Cradle Other Clinician: Referring Physician: Paulita Cradle Treating Physician/Extender: Frann Rider in Treatment: 7 Encounter Discharge Information  Items Discharge Pain Level: 0 Discharge Condition: Stable Ambulatory Status: Wheelchair Discharge Destination: Home Transportation: Private Auto Accompanied By: spouse Schedule Follow-up Appointment: Yes Medication Reconciliation completed No and provided to Patient/Care Karan Inclan: Patient Clinical Summary of Care: Declined Electronic Signature(s) Signed: 05/02/2015 3:56:07 PM By: Ruthine Dose Previous Signature: 05/02/2015 3:11:46 PM Version By: Montey Hora Entered By: Ruthine Dose on 05/02/2015 15:56:07 Hickey, Jenasis H. ([id number]) -------------------------------------------------------------------------------- Lower Extremity Assessment Details Patient Name: Compere, Roslind H. Date of Service: 05/02/2015 2:15 PM Medical Record Number: [id number] Patient Account Number: [id number] Date of Birth/Sex: 1946/06/01 (69 y.o. Female) Treating RN: Montey Hora Primary Care Physician: Paulita Cradle Other Clinician: Referring Physician: Paulita Cradle Treating Physician/Extender: Frann Rider in Treatment: 7 Vascular Assessment Pulses: Posterior Tibial Dorsalis Pedis Palpable: [Right:Yes] Extremity colors, hair growth, and conditions: Extremity Color: [Right:Mottled] Hair Growth on Extremity: [Right:No] Temperature of Extremity: [Right:Cool] Capillary Refill: [Right:< 3 seconds] Toe Nail Assessment Left: Right: Thick: Yes Discolored: Yes Deformed: Yes Improper Length and Hygiene: No Electronic Signature(s) Signed: 05/02/2015 4:58:40 PM By: Montey Hora Entered By: Montey Hora on 05/02/2015 14:32:19 Altamirano, Decie H. ([id number]) -------------------------------------------------------------------------------- Multi Wound Chart Details Patient Name: Lozoya, Jesalyn H. Date of Service: 05/02/2015 2:15 PM Medical Record Number: [id number] Patient Account Number: [id number] Date of Birth/Sex: 10/04/1945 (69 y.o. Female) Treating RN: Montey Hora Primary Care  Physician: Paulita Cradle Other Clinician: Referring Physician: Paulita Cradle Treating Physician/Extender: Frann Rider in Treatment: 7 Vital Signs Height(in): 65 Pulse(bpm): 107 Weight(lbs): 118 Blood Pressure 103/67 (mmHg): Body Mass Index(BMI): 20 Temperature(F): 98.1 Respiratory Rate 16 (breaths/min): Photos: [1:No Photos] [2:No Photos] [N/A:N/A] Wound Location: [1:Right Calcaneous] [2:Right Elbow] [N/A:N/A] Wounding Event: [1:Gradually Appeared] [2:Gradually Appeared] [N/A:N/A] Primary Etiology: [1:Pressure Ulcer] [2:Pressure Ulcer] [N/A:N/A] Comorbid History: [1:Cataracts, Asthma, Hypertension, Type II Diabetes, Neuropathy, Received Chemotherapy] [2:Cataracts, Asthma, Hypertension, Type II Diabetes, Neuropathy, Received Chemotherapy] [N/A:N/A] Date Acquired: [1:02/25/2015] [2:02/25/2015] [N/A:N/A] Weeks of Treatment: [1:7] [2:7] [  N/A:N/A] Wound Status: [1:Open] [2:Open] [N/A:N/A] Measurements L x W x D 0.4x0.4x0.1 [2:1.2x1x0.2] [N/A:N/A] (cm) Area (cm) : [1:0.126] [2:0.942] [N/A:N/A] Volume (cm) : [1:0.013] [2:0.188] [N/A:N/A] % Reduction in Area: [1:97.90%] [2:50.00%] [N/A:N/A] % Reduction in Volume: 98.90% [2:50.10%] [N/A:N/A] Classification: [1:Category/Stage III] [2:Category/Stage III] [N/A:N/A] HBO Classification: [1:Grade 1] [2:N/A] [N/A:N/A] Exudate Amount: [1:None Present] [2:Medium] [N/A:N/A] Exudate Type: [1:N/A] [2:Serosanguineous] [N/A:N/A] Exudate Color: [1:N/A] [2:red, brown] [N/A:N/A] Wound Margin: [1:Distinct, outline attached] [2:Distinct, outline attached] [N/A:N/A] Granulation Amount: [1:Medium (34-66%)] [2:None Present (0%)] [N/A:N/A] Granulation Quality: [1:Pink, Pale] [2:N/A] [N/A:N/A] Necrotic Amount: [1:Medium (34-66%)] [2:Large (67-100%)] [N/A:N/A] Necrotic Tissue: [1:Adherent Slough] [2:Eschar] [N/A:N/A] Exposed Structures: [1:Fascia: No Fat: No Tendon: No] [2:Fascia: No Fat: No Tendon: No] [N/A:N/A] Muscle: No Muscle:  No Joint: No Joint: No Bone: No Bone: No Limited to Skin Limited to Skin Breakdown Breakdown Epithelialization: Small (1-33%) Small (1-33%) N/A Periwound Skin Texture: Edema: Yes Edema: No N/A Excoriation: No Excoriation: No Induration: No Induration: No Callus: No Callus: No Crepitus: No Crepitus: No Fluctuance: No Fluctuance: No Friable: No Friable: No Rash: No Rash: No Scarring: No Scarring: No Periwound Skin Moist: Yes Dry/Scaly: Yes N/A Moisture: Maceration: No Maceration: No Dry/Scaly: No Moist: No Periwound Skin Color: Atrophie Blanche: No Atrophie Blanche: No N/A Cyanosis: No Cyanosis: No Ecchymosis: No Ecchymosis: No Erythema: No Erythema: No Hemosiderin Staining: No Hemosiderin Staining: No Mottled: No Mottled: No Pallor: No Pallor: No Rubor: No Rubor: No Temperature: No Abnormality No Abnormality N/A Tenderness on Yes No N/A Palpation: Wound Preparation: Ulcer Cleansing: Ulcer Cleansing: N/A Rinsed/Irrigated with Rinsed/Irrigated with Saline Saline Topical Anesthetic Topical Anesthetic Applied: Other: lidocaine Applied: Other: lidocaine 4% 4% Treatment Notes Electronic Signature(s) Signed: 05/02/2015 4:58:40 PM By: Montey Hora Entered By: Montey Hora on 05/02/2015 14:41:52 Rushlow, Lyrical H. ([id number]) -------------------------------------------------------------------------------- Multi-Disciplinary Care Plan Details Patient Name: [name], Jackelyne H. Date of Service: 05/02/2015 2:15 PM Medical Record Number: [id number] Patient Account Number: [id number] Date of Birth/Sex: [birth year] (69 y.o. Female) Treating RN: Montey Hora Primary Care Physician: Paulita Cradle Other Clinician: Referring Physician: Paulita Cradle Treating Physician/Extender: Frann Rider in Treatment: 7 Active Inactive Abuse / Safety / Falls / Self Care Management Nursing Diagnoses: Impaired home maintenance Impaired physical mobility Knowledge  deficit related to: safety; personal, health (wound), emergency Potential for falls Self care deficit: actual or potential Goals: Patient will remain injury free Date Initiated: 03/13/2015 Goal Status: Active Patient/caregiver will verbalize understanding of skin care regimen Date Initiated: 03/13/2015 Goal Status: Active Patient/caregiver will verbalize/demonstrate measure taken to improve self care Date Initiated: 03/13/2015 Goal Status: Active Patient/caregiver will verbalize/demonstrate measures taken to improve the patient's personal safety Date Initiated: 03/13/2015 Goal Status: Active Patient/caregiver will verbalize/demonstrate measures taken to prevent injury and/or falls Date Initiated: 03/13/2015 Goal Status: Active Patient/caregiver will verbalize/demonstrate understanding of what to do in case of emergency Date Initiated: 03/13/2015 Goal Status: Active Interventions: Assess fall risk on admission and as needed Assess: immobility, friction, shearing, incontinence upon admission and as needed Assess impairment of mobility on admission and as needed per policy Assess self care needs on admission and as needed Provide education on basic hygiene Olejnik, Rogers. ([id number]) Provide education on fall prevention Provide education on personal and home safety Provide education on safe transfers Treatment Activities: Education provided on Basic Hygiene : 03/13/2015 Notes: Orientation to the Wound Care Program Nursing Diagnoses: Knowledge deficit related to the wound healing center program Goals: Patient/caregiver will verbalize understanding of the [facility] Date Initiated: 03/13/2015 Goal Status: Active Interventions: Provide education  on orientation to the wound center Notes: Pressure Nursing Diagnoses: Knowledge deficit related to causes and risk factors for pressure ulcer development Knowledge deficit related to management of pressures  ulcers Potential for impaired tissue integrity related to pressure, friction, moisture, and shear Goals: Patient will remain free from development of additional pressure ulcers Date Initiated: 03/13/2015 Goal Status: Active Patient will remain free of pressure ulcers Date Initiated: 03/13/2015 Goal Status: Active Patient/caregiver will verbalize risk factors for pressure ulcer development Date Initiated: 03/13/2015 Goal Status: Active Patient/caregiver will verbalize understanding of pressure ulcer management Date Initiated: 03/13/2015 Goal Status: Active Interventions: Assess: immobility, friction, shearing, incontinence upon admission and as needed Obeirne, Laurana H. ([id number]) Assess offloading mechanisms upon admission and as needed Assess potential for pressure ulcer upon admission and as needed Provide education on pressure ulcers Treatment Activities: Patient referred for home evaluation of offloading devices/mattresses : 05/02/2015 Patient referred for pressure reduction/relief devices : 05/02/2015 Patient referred for seating evaluation to ensure proper offloading : 05/02/2015 Pressure reduction/relief device ordered : 05/02/2015 Test ordered outside of clinic : 05/02/2015 Notes: Wound/Skin Impairment Nursing Diagnoses: Impaired tissue integrity Knowledge deficit related to ulceration/compromised skin integrity Goals: Patient/caregiver will verbalize understanding of skin care regimen Date Initiated: 03/13/2015 Goal Status: Active Ulcer/skin breakdown will have a volume reduction of 30% by week 4 Date Initiated: 03/13/2015 Goal Status: Active Ulcer/skin breakdown will have a volume reduction of 50% by week 8 Date Initiated: 03/13/2015 Goal Status: Active Ulcer/skin breakdown will have a volume reduction of 80% by week 12 Date Initiated: 03/13/2015 Goal Status: Active Ulcer/skin breakdown will heal within 14 weeks Date Initiated: 03/13/2015 Goal Status:  Active Interventions: Assess patient/caregiver ability to obtain necessary supplies Assess patient/caregiver ability to perform ulcer/skin care regimen upon admission and as needed Assess ulceration(s) every visit Provide education on smoking Provide education on ulcer and skin care Treatment Activities: Patient referred to home care : 05/02/2015 CHATHAM, Imberly H. ([id number]) Referred to DME Eleny Cortez for dressing supplies : 05/02/2015 Skin care regimen initiated : 05/02/2015 Topical wound management initiated : 05/02/2015 Notes: Electronic Signature(s) Signed: 05/02/2015 4:58:40 PM By: Montey Hora Entered By: Montey Hora on 05/02/2015 14:41:43 Massie, Adaleah H. ([id number]) -------------------------------------------------------------------------------- Pain Assessment Details Patient Name: [name], Marva H. Date of Service: 05/02/2015 2:15 PM Medical Record Number: [id number] Patient Account Number: [id number] Date of Birth/Sex: October 29, 1945 (69 y.o. Female) Treating RN: Montey Hora Primary Care Physician: Paulita Cradle Other Clinician: Referring Physician: Paulita Cradle Treating Physician/Extender: Frann Rider in Treatment: 7 Active Problems Location of Pain Severity and Description of Pain Patient Has Paino Yes Site Locations Pain Location: Pain in Ulcers With Dressing Change: Yes Duration of the Pain. Constant / Intermittento Constant Pain Management and Medication Current Pain Management: Electronic Signature(s) Signed: 05/02/2015 4:58:40 PM By: Montey Hora Entered By: Montey Hora on 05/02/2015 14:24:11 Shaheed, Kathrina H. ([id number]) -------------------------------------------------------------------------------- Patient/Caregiver Education Details Patient Name: [name], Cobi H. Date of Service: 05/02/2015 2:15 PM Medical Record Number: [id number] Patient Account Number: [id number] Date of Birth/Gender: August 04, 1946 (69 y.o. Female) Treating RN: Montey Hora Primary Care Physician: Paulita Cradle Other Clinician: Referring Physician: Paulita Cradle Treating Physician/Extender: Frann Rider in Treatment: 7 Education Assessment Education Provided To: Patient and Caregiver Education Topics Provided Wound/Skin Impairment: Handouts: Other: wound care as ordered Methods: Demonstration, Explain/Verbal Responses: State content correctly Electronic Signature(s) Signed: 05/02/2015 3:12:07 PM By: Montey Hora Entered By: Montey Hora on 05/02/2015 15:12:07 Brzozowski, Laci H. ([id number]) -------------------------------------------------------------------------------- Wound Assessment Details Patient Name: Abdulla, Alaysha H. Date of Service: 05/02/2015 2:15  PM Medical Record Number: [id number] Patient Account Number: [id number] Date of Birth/Sex: [birth year] (69 y.o. Female) Treating RN: Montey Hora Primary Care Physician: Paulita Cradle Other Clinician: Referring Physician: Paulita Cradle Treating Physician/Extender: Frann Rider in Treatment: 7 Wound Status Wound Number: 1 Primary Pressure Ulcer Etiology: Wound Location: Right Calcaneous Wound Open Wounding Event: Gradually Appeared Status: Date Acquired: 02/25/2015 Comorbid Cataracts, Asthma, Hypertension, Type Weeks Of Treatment: 7 History: II Diabetes, Neuropathy, Received Clustered Wound: No Chemotherapy Photos Photo Uploaded By: Montey Hora on 05/02/2015 16:57:05 Wound Measurements Length: (cm) 0.4 Width: (cm) 0.4 Depth: (cm) 0.1 Area: (cm) 0.126 Volume: (cm) 0.013 % Reduction in Area: 97.9% % Reduction in Volume: 98.9% Epithelialization: Small (1-33%) Tunneling: No Undermining: No Wound Description Classification: Category/Stage III Foul O Diabetic Severity (Wagner): Grade 1 Wound Margin: Distinct, outline attached Exudate Amount: None Present dor After Cleansing: No Wound Bed Granulation Amount: Medium (34-66%) Exposed  Structure Granulation Quality: Pink, Pale Fascia Exposed: No Necrotic Amount: Medium (34-66%) Fat Layer Exposed: No Necrotic Quality: Adherent Slough Tendon Exposed: No Muscle Exposed: No Bogdon, Natha H. ([id number]) Joint Exposed: No Bone Exposed: No Limited to Skin Breakdown Periwound Skin Texture Texture Color No Abnormalities Noted: No No Abnormalities Noted: No Callus: No Atrophie Blanche: No Crepitus: No Cyanosis: No Excoriation: No Ecchymosis: No Fluctuance: No Erythema: No Friable: No Hemosiderin Staining: No Induration: No Mottled: No Localized Edema: Yes Pallor: No Rash: No Rubor: No Scarring: No Temperature / Pain Moisture Temperature: No Abnormality No Abnormalities Noted: No Tenderness on Palpation: Yes Dry / Scaly: No Maceration: No Moist: Yes Wound Preparation Ulcer Cleansing: Rinsed/Irrigated with Saline Topical Anesthetic Applied: Other: lidocaine 4%, Treatment Notes Wound #1 (Right Calcaneous) 1. Cleansed with: Clean wound with Normal Saline 2. Anesthetic Topical Lidocaine 4% cream to wound bed prior to debridement 4. Dressing Applied: Aquacel Ag 5. Secondary Dressing Applied Bordered Foam Dressing Electronic Signature(s) Signed: 05/02/2015 4:58:40 PM By: Montey Hora Entered By: Montey Hora on 05/02/2015 14:33:44 Henricksen, Aracelly H. ([id number]) -------------------------------------------------------------------------------- Wound Assessment Details Patient Name: Valiente, Brooks H. Date of Service: 05/02/2015 2:15 PM Medical Record Number: [id number] Patient Account Number: [id number] Date of Birth/Sex: 12/27/1945 (69 y.o. Female) Treating RN: Montey Hora Primary Care Physician: Paulita Cradle Other Clinician: Referring Physician: Paulita Cradle Treating Physician/Extender: Frann Rider in Treatment: 7 Wound Status Wound Number: 2 Primary Pressure Ulcer Etiology: Wound Location: Right Elbow Wound Open Wounding  Event: Gradually Appeared Status: Date Acquired: 02/25/2015 Comorbid Cataracts, Asthma, Hypertension, Type Weeks Of Treatment: 7 History: II Diabetes, Neuropathy, Received Clustered Wound: No Chemotherapy Photos Photo Uploaded By: Montey Hora on 05/02/2015 16:57:06 Wound Measurements Length: (cm) 1.2 Width: (cm) 1 Depth: (cm) 0.2 Area: (cm) 0.942 Volume: (cm) 0.188 % Reduction in Area: 50% % Reduction in Volume: 50.1% Epithelialization: Small (1-33%) Tunneling: No Undermining: No Wound Description Classification: Category/Stage III Wound Margin: Distinct, outline attached Exudate Amount: Medium Exudate Type: Serosanguineous Exudate Color: red, brown Foul Odor After Cleansing: No Wound Bed Granulation Amount: None Present (0%) Exposed Structure Necrotic Amount: Large (67-100%) Fascia Exposed: No Necrotic Quality: Eschar Fat Layer Exposed: No Tendon Exposed: No Lorenzi, Marvina H. ([id number]) Muscle Exposed: No Joint Exposed: No Bone Exposed: No Limited to Skin Breakdown Periwound Skin Texture Texture Color No Abnormalities Noted: No No Abnormalities Noted: No Callus: No Atrophie Blanche: No Crepitus: No Cyanosis: No Excoriation: No Ecchymosis: No Fluctuance: No Erythema: No Friable: No Hemosiderin Staining: No Induration: No Mottled: No Localized Edema: No Pallor: No Rash: No Rubor: No Scarring: No Temperature / Pain Moisture  Temperature: No Abnormality No Abnormalities Noted: No Dry / Scaly: Yes Maceration: No Moist: No Wound Preparation Ulcer Cleansing: Rinsed/Irrigated with Saline Topical Anesthetic Applied: Other: lidocaine 4%, Treatment Notes Wound #2 (Right Elbow) 1. Cleansed with: Clean wound with Normal Saline 2. Anesthetic Topical Lidocaine 4% cream to wound bed prior to debridement 4. Dressing Applied: Aquacel Ag 5. Secondary Dressing Applied Bordered Foam Dressing Electronic Signature(s) Signed: 05/02/2015 4:58:40 PM By:  Montey Hora Entered By: Montey Hora on 05/02/2015 14:39:27 Defalco, Berlinda H. ([id number]) -------------------------------------------------------------------------------- Wound Assessment Details Patient Name: Yahnke, Jilliann H. Date of Service: 05/02/2015 2:15 PM Medical Record Number: [id number] Patient Account Number: [id number] Date of Birth/Sex: Jan 20, 1946 (69 y.o. Female) Treating RN: Montey Hora Primary Care Physician: Paulita Cradle Other Clinician: Referring Physician: Paulita Cradle Treating Physician/Extender: Frann Rider in Treatment: 7 Wound Status Wound Number: 3 Primary Pressure Ulcer Etiology: Wound Location: Back - Midline Wound Open Wounding Event: Pressure Injury Status: Date Acquired: 05/02/2015 Comorbid Cataracts, Asthma, Hypertension, Type Weeks Of Treatment: 0 History: II Diabetes, Neuropathy, Received Clustered Wound: No Chemotherapy Photos Photo Uploaded By: Montey Hora on 05/02/2015 16:56:35 Wound Measurements Length: (cm) 0.7 Width: (cm) 0.4 Depth: (cm) 0.1 Area: (cm) 0.22 Volume: (cm) 0.022 % Reduction in Area: 0% % Reduction in Volume: 0% Epithelialization: None Tunneling: No Undermining: No Wound Description Classification: Category/Stage II Wound Margin: Flat and Intact Exudate Amount: Small Exudate Type: Serous Exudate Color: amber Foul Odor After Cleansing: No Wound Bed Granulation Amount: Large (67-100%) Exposed Structure Granulation Quality: Red Fascia Exposed: No Necrotic Amount: None Present (0%) Fat Layer Exposed: No Tendon Exposed: No Protzman, Kyli H. ([id number]) Muscle Exposed: No Joint Exposed: No Bone Exposed: No Limited to Skin Breakdown Periwound Skin Texture Texture Color No Abnormalities Noted: No No Abnormalities Noted: No Callus: No Atrophie Blanche: No Crepitus: No Cyanosis: No Excoriation: No Ecchymosis: No Fluctuance: No Erythema: No Friable: No Hemosiderin Staining:  No Induration: No Mottled: No Localized Edema: No Pallor: No Rash: No Rubor: No Scarring: No Temperature / Pain Moisture Temperature: No Abnormality No Abnormalities Noted: No Tenderness on Palpation: Yes Dry / Scaly: No Maceration: No Moist: No Wound Preparation Ulcer Cleansing: Rinsed/Irrigated with Saline Treatment Notes Wound #3 (Midline Back) 1. Cleansed with: Clean wound with Normal Saline 5. Secondary Dressing Applied Bordered Foam Dressing Electronic Signature(s) Signed: 05/02/2015 3:09:22 PM By: Montey Hora Entered By: Montey Hora on 05/02/2015 15:09:21 Magda, Calene H. ([id number]) -------------------------------------------------------------------------------- Vitals Details Patient Name: [name], Janelly H. Date of Service: 05/02/2015 2:15 PM Medical Record Number: [id number] Patient Account Number: [id number] Date of Birth/Sex: May 13, 1946 (69 y.o. Female) Treating RN: Montey Hora Primary Care Physician: Paulita Cradle Other Clinician: Referring Physician: Paulita Cradle Treating Physician/Extender: Frann Rider in Treatment: 7 Vital Signs Time Taken: 14:24 Temperature (F): 98.1 Height (in): 65 Pulse (bpm): 107 Weight (lbs): 118 Respiratory Rate (breaths/min): 16 Body Mass Index (BMI): 19.6 Blood Pressure (mmHg): 103/67 Reference Range: 80 - 120 mg / dl Electronic Signature(s) Signed: 05/02/2015 4:58:40 PM By: Montey Hora Entered By: Montey Hora on 05/02/2015 14:27:17

## 2015-05-09 ENCOUNTER — Encounter: Payer: Medicare Other | Admitting: Surgery

## 2015-05-09 DIAGNOSIS — L89613 Pressure ulcer of right heel, stage 3: Secondary | ICD-10-CM | POA: Diagnosis not present

## 2015-05-09 NOTE — Progress Notes (Signed)
BARCENAS, Aditri H. ([id number]) Visit Report for 05/09/2015 Arrival Information Details Patient Name: MOUNTJOY, Mahek H. Date of Service: 05/09/2015 1:45 PM Medical Record Number: [id number] Patient Account Number: [id number] Date of Birth/Sex: [birth year] (69 y.o. Female) Treating RN: Afful, RN, BSN, Velva Harman Primary Care Physician: Paulita Cradle Other Clinician: Referring Physician: Paulita Cradle Treating Physician/Extender: Frann Rider in Treatment: 8 Visit Information History Since Last Visit Added or deleted any medications: No Patient Arrived: Wheel Chair Any new allergies or adverse reactions: No Arrival Time: 13:53 Had a fall or experienced change in No activities of daily living that may affect Accompanied By: spouse risk of falls: Transfer Assistance: Manual Signs or symptoms of abuse/neglect since last No Patient Identification Verified: Yes visito Secondary Verification Process Yes Has Dressing in Place as Prescribed: Yes Completed: Pain Present Now: No Patient Requires Transmission-Based No Precautions: Patient Has Alerts: No Electronic Signature(s) Signed: 05/09/2015 1:54:02 PM By: Regan Lemming BSN, RN Entered By: Regan Lemming on 05/09/2015 13:54:02 [place], [place]. ([id number]) -------------------------------------------------------------------------------- Clinic Level of Care Assessment Details Patient Name: Belter, Jahara H. Date of Service: 05/09/2015 1:45 PM Medical Record Number: [id number] Patient Account Number: [id number] Date of Birth/Sex: Apr 04, 1946 (69 y.o. Female) Treating RN: Afful, RN, BSN, [place] Primary Care Physician: Paulita Cradle Other Clinician: Referring Physician: Paulita Cradle Treating Physician/Extender: Frann Rider in Treatment: 8 Clinic Level of Care Assessment Items TOOL 4 Quantity Score []  - Use when only an EandM is performed on FOLLOW-UP visit 0 ASSESSMENTS - Nursing Assessment / Reassessment X - Reassessment of  Co-morbidities (includes updates in patient status) 1 10 X - Reassessment of Adherence to Treatment Plan 1 5 ASSESSMENTS - Wound and Skin Assessment / Reassessment []  - Simple Wound Assessment / Reassessment - one wound 0 X - Complex Wound Assessment / Reassessment - multiple wounds 2 5 []  - Dermatologic / Skin Assessment (not related to wound area) 0 ASSESSMENTS - Focused Assessment []  - Circumferential Edema Measurements - multi extremities 0 []  - Nutritional Assessment / Counseling / Intervention 0 X - Lower Extremity Assessment (monofilament, tuning fork, pulses) 1 5 []  - Peripheral Arterial Disease Assessment (using hand held doppler) 0 ASSESSMENTS - Ostomy and/or Continence Assessment and Care []  - Incontinence Assessment and Management 0 []  - Ostomy Care Assessment and Management (repouching, etc.) 0 PROCESS - Coordination of Care X - Simple Patient / Family Education for ongoing care 1 15 []  - Complex (extensive) Patient / Family Education for ongoing care 0 []  - Staff obtains Programmer, systems, Records, Test Results / Process Orders 0 []  - Staff telephones HHA, Nursing Homes / Clarify orders / etc 0 []  - Routine Transfer to another Facility (non-emergent condition) 0 Crunkleton, Yazlyn H. ([id number]) []  - Routine Hospital Admission (non-emergent condition) 0 []  - New Admissions / Biomedical engineer / Ordering NPWT, Apligraf, etc. 0 []  - Emergency Hospital Admission (emergent condition) 0 []  - Simple Discharge Coordination 0 []  - Complex (extensive) Discharge Coordination 0 PROCESS - Special Needs []  - Pediatric / Minor Patient Management 0 []  - Isolation Patient Management 0 []  - Hearing / Language / Visual special needs 0 []  - Assessment of Community assistance (transportation, D/C planning, etc.) 0 []  - Additional assistance / Altered mentation 0 []  - Support Surface(s) Assessment (bed, cushion, seat, etc.) 0 INTERVENTIONS - Wound Cleansing / Measurement X - Simple Wound Cleansing  - one wound 1 5 []  - Complex Wound Cleansing - multiple wounds 0 X - Wound Imaging (photographs - any number of wounds) 1 5 []  -  Wound Tracing (instead of photographs) 0 []  - Simple Wound Measurement - one wound 0 X - Complex Wound Measurement - multiple wounds 2 5 INTERVENTIONS - Wound Dressings X - Small Wound Dressing one or multiple wounds 1 10 []  - Medium Wound Dressing one or multiple wounds 0 []  - Large Wound Dressing one or multiple wounds 0 []  - Application of Medications - topical 0 []  - Application of Medications - injection 0 INTERVENTIONS - Miscellaneous []  - External ear exam 0 Muralles, Trannie H. ([id number]) []  - Specimen Collection (cultures, biopsies, blood, body fluids, etc.) 0 []  - Specimen(s) / Culture(s) sent or taken to Lab for analysis 0 []  - Patient Transfer (multiple staff / [name] / Similar devices) 0 []  - Simple Staple / Suture removal (25 or less) 0 []  - Complex Staple / Suture removal (26 or more) 0 []  - Hypo / Hyperglycemic Management (close monitor of Blood Glucose) 0 []  - Ankle / Brachial Index (ABI) - do not check if billed separately 0 X - Vital Signs 1 5 Has the patient been seen at the hospital within the last three years: Yes Total Score: 80 Level Of Care: New/Established - Level 3 Electronic Signature(s) Signed: 05/09/2015 2:26:31 PM By: Regan Lemming BSN, RN Entered By: Regan Lemming on 05/09/2015 14:26:30 Phimmasone, Amberly H. ([id number]) -------------------------------------------------------------------------------- Encounter Discharge Information Details Patient Name: [name], Shanay H. Date of Service: 05/09/2015 1:45 PM Medical Record Number: [id number] Patient Account Number: [id number] Date of Birth/Sex: 05/07/1946 (69 y.o. Female) Treating RN: Afful, RN, BSN, Velva Harman Primary Care Physician: Paulita Cradle Other Clinician: Referring Physician: Paulita Cradle Treating Physician/Extender: Frann Rider in Treatment: 8 Encounter  Discharge Information Items Discharge Pain Level: 0 Discharge Condition: Stable Ambulatory Status: Wheelchair Discharge Destination: Home Transportation: Private Auto Accompanied By: spouse Schedule Follow-up Appointment: No Medication Reconciliation completed and provided to Patient/Care No Megean Fabio: Provided on Clinical Summary of Care: 05/09/2015 Form Type Recipient Paper Patient AS Electronic Signature(s) Signed: 05/09/2015 2:54:27 PM By: Regan Lemming BSN, RN Previous Signature: 05/09/2015 2:25:39 PM Version By: Ruthine Dose Entered By: Regan Lemming on 05/09/2015 14:54:27 Bricco, Zniya H. ([id number]) -------------------------------------------------------------------------------- Lower Extremity Assessment Details Patient Name: Kouns, Avyanna H. Date of Service: 05/09/2015 1:45 PM Medical Record Number: [id number] Patient Account Number: [id number] Date of Birth/Sex: 11-22-1945 (69 y.o. Female) Treating RN: Afful, RN, BSN, Velva Harman Primary Care Physician: Paulita Cradle Other Clinician: Referring Physician: Paulita Cradle Treating Physician/Extender: Frann Rider in Treatment: 8 Vascular Assessment Pulses: Posterior Tibial Dorsalis Pedis Palpable: [Right:Yes] Extremity colors, hair growth, and conditions: Extremity Color: [Right:Mottled] Hair Growth on Extremity: [Right:No] Temperature of Extremity: [Right:Warm] Capillary Refill: [Right:< 3 seconds] Toe Nail Assessment Left: Right: Thick: Yes Discolored: Yes Deformed: No Improper Length and Hygiene: No Electronic Signature(s) Signed: 05/09/2015 2:08:48 PM By: Regan Lemming BSN, RN Entered By: Regan Lemming on 05/09/2015 14:08:47 Lottman, Luise H. ([id number]) -------------------------------------------------------------------------------- Multi Wound Chart Details Patient Name: [name], Secilia H. Date of Service: 05/09/2015 1:45 PM Medical Record Number: [id number] Patient Account Number: [id number] Date of  Birth/Sex: 11/22/1945 (69 y.o. Female) Treating RN: Baruch Gouty, RN, BSN, Velva Harman Primary Care Physician: Paulita Cradle Other Clinician: Referring Physician: Paulita Cradle Treating Physician/Extender: Frann Rider in Treatment: 8 Vital Signs Height(in): 65 Pulse(bpm): 117 Weight(lbs): 118 Blood Pressure 125/95 (mmHg): Body Mass Index(BMI): 20 Temperature(F): 99.1 Respiratory Rate 16 (breaths/min): Photos: [1:No Photos] [2:No Photos] [3:No Photos] Wound Location: [1:Right Calcaneous] [2:Right Elbow] [3:Back - Midline] Wounding Event: [1:Gradually Appeared] [2:Gradually Appeared] [3:Pressure Injury] Primary Etiology: [1:Pressure Ulcer] [2:Pressure Ulcer] [3:Pressure  Ulcer] Comorbid History: [1:Cataracts, Asthma, Hypertension, Type II Diabetes, Neuropathy, Received Chemotherapy] [2:Cataracts, Asthma, Hypertension, Type II Diabetes, Neuropathy, Received Chemotherapy] [3:Cataracts, Asthma, Hypertension, Type II Diabetes,  Neuropathy, Received Chemotherapy] Date Acquired: [1:02/25/2015] [2:02/25/2015] [3:05/02/2015] Weeks of Treatment: [1:8] [2:8] [3:1] Wound Status: [1:Healed - Epithelialized] [2:Open] [3:Open] Measurements L x W x D 0x0x0 [2:0.8x0.7x0.2] [3:1.5x1.3x0.1] (cm) Area (cm) : [1:0] [2:0.44] [3:1.532] Volume (cm) : [1:0] [2:0.088] [3:0.153] % Reduction in Area: [1:100.00%] [2:76.70%] [3:-596.40%] % Reduction in Volume: 100.00% [2:76.70%] [3:-595.50%] Classification: [1:Category/Stage III] [2:Category/Stage III] [3:Category/Stage II] HBO Classification: [1:Grade 1] [2:N/A] [3:N/A] Exudate Amount: [1:None Present] [2:None Present] [3:None Present] Wound Margin: [1:Distinct, outline attached] [2:Distinct, outline attached] [3:Flat and Intact] Granulation Amount: [1:Medium (34-66%)] [2:None Present (0%)] [3:Large (67-100%)] Granulation Quality: [1:Pink, Pale] [2:N/A] [3:Red] Necrotic Amount: [1:None Present (0%)] [2:Large (67-100%)] [3:None Present (0%)] Necrotic  Tissue: [1:N/A] [2:Eschar] [3:N/A] Exposed Structures: [1:Fascia: No Fat: No Tendon: No Muscle: No Joint: No] [2:Fascia: No Fat: No Tendon: No Muscle: No Joint: No] [3:Fascia: No Fat: No Tendon: No Muscle: No Joint: No] Bone: No Bone: No Bone: No Limited to Skin Limited to Skin Limited to Skin Breakdown Breakdown Breakdown Epithelialization: Large (67-100%) Small (1-33%) None Periwound Skin Texture: Edema: Yes Edema: No Edema: No Excoriation: No Excoriation: No Excoriation: No Induration: No Induration: No Induration: No Callus: No Callus: No Callus: No Crepitus: No Crepitus: No Crepitus: No Fluctuance: No Fluctuance: No Fluctuance: No Friable: No Friable: No Friable: No Rash: No Rash: No Rash: No Scarring: No Scarring: No Scarring: No Periwound Skin Dry/Scaly: Yes Dry/Scaly: Yes Dry/Scaly: Yes Moisture: Maceration: No Maceration: No Maceration: No Moist: No Moist: No Moist: No Periwound Skin Color: Atrophie Blanche: No Atrophie Blanche: No Atrophie Blanche: No Cyanosis: No Cyanosis: No Cyanosis: No Ecchymosis: No Ecchymosis: No Ecchymosis: No Erythema: No Erythema: No Erythema: No Hemosiderin Staining: No Hemosiderin Staining: No Hemosiderin Staining: No Mottled: No Mottled: No Mottled: No Pallor: No Pallor: No Pallor: No Rubor: No Rubor: No Rubor: No Temperature: No Abnormality No Abnormality No Abnormality Tenderness on Yes No Yes Palpation: Wound Preparation: Ulcer Cleansing: Ulcer Cleansing: Ulcer Cleansing: Rinsed/Irrigated with Rinsed/Irrigated with Rinsed/Irrigated with Saline Saline Saline Topical Anesthetic Topical Anesthetic Applied: None Applied: Other: lidocaine 4% Treatment Notes Electronic Signature(s) Signed: 05/09/2015 2:15:16 PM By: Regan Lemming BSN, RN Entered By: Regan Lemming on 05/09/2015 14:15:16 Granderson, Nalee H.  ([id number]) -------------------------------------------------------------------------------- [place] Details Patient Name: [name], Mariamawit H. Date of Service: 05/09/2015 1:45 PM Medical Record Number: [id number] Patient Account Number: [id number] Date of Birth/Sex: 23-Nov-1945 (69 y.o. Female) Treating RN: Afful, RN, BSN, Velva Harman Primary Care Physician: Paulita Cradle Other Clinician: Referring Physician: Paulita Cradle Treating Physician/Extender: Frann Rider in Treatment: 8 Active Inactive Abuse / Safety / Falls / Self Care Management Nursing Diagnoses: Impaired home maintenance Impaired physical mobility Knowledge deficit related to: safety; personal, health (wound), emergency Potential for falls Self care deficit: actual or potential Goals: Patient will remain injury free Date Initiated: 03/13/2015 Goal Status: Active Patient/caregiver will verbalize understanding of skin care regimen Date Initiated: 03/13/2015 Goal Status: Active Patient/caregiver will verbalize/demonstrate measure taken to improve self care Date Initiated: 03/13/2015 Goal Status: Active Patient/caregiver will verbalize/demonstrate measures taken to improve the patient's personal safety Date Initiated: 03/13/2015 Goal Status: Active Patient/caregiver will verbalize/demonstrate measures taken to prevent injury and/or falls Date Initiated: 03/13/2015 Goal Status: Active Patient/caregiver will verbalize/demonstrate understanding of what to do in case of emergency Date Initiated: 03/13/2015 Goal Status: Active Interventions: Assess fall risk on admission and as needed Assess: immobility,  friction, shearing, incontinence upon admission and as needed Assess impairment of mobility on admission and as needed per policy Assess self care needs on admission and as needed Provide education on basic hygiene [place], [place]. ([id number]) Provide education on fall prevention Provide education  on personal and home safety Provide education on safe transfers Treatment Activities: Education provided on Basic Hygiene : 03/13/2015 Notes: Orientation to the Wound Care Program Nursing Diagnoses: Knowledge deficit related to the wound healing center program Goals: Patient/caregiver will verbalize understanding of the [place] Date Initiated: 03/13/2015 Goal Status: Active Interventions: Provide education on orientation to the wound center Notes: Pressure Nursing Diagnoses: Knowledge deficit related to causes and risk factors for pressure ulcer development Knowledge deficit related to management of pressures ulcers Potential for impaired tissue integrity related to pressure, friction, moisture, and shear Goals: Patient will remain free from development of additional pressure ulcers Date Initiated: 03/13/2015 Goal Status: Active Patient will remain free of pressure ulcers Date Initiated: 03/13/2015 Goal Status: Active Patient/caregiver will verbalize risk factors for pressure ulcer development Date Initiated: 03/13/2015 Goal Status: Active Patient/caregiver will verbalize understanding of pressure ulcer management Date Initiated: 03/13/2015 Goal Status: Active Interventions: Assess: immobility, friction, shearing, incontinence upon admission and as needed Campo, Tasfia H. ([id number]) Assess offloading mechanisms upon admission and as needed Assess potential for pressure ulcer upon admission and as needed Provide education on pressure ulcers Treatment Activities: Patient referred for home evaluation of offloading devices/mattresses : 05/09/2015 Patient referred for pressure reduction/relief devices : 05/09/2015 Patient referred for seating evaluation to ensure proper offloading : 05/09/2015 Pressure reduction/relief device ordered : 05/09/2015 Test ordered outside of clinic : 05/09/2015 Notes: Wound/Skin Impairment Nursing Diagnoses: Impaired tissue  integrity Knowledge deficit related to ulceration/compromised skin integrity Goals: Patient/caregiver will verbalize understanding of skin care regimen Date Initiated: 03/13/2015 Goal Status: Active Ulcer/skin breakdown will have a volume reduction of 30% by week 4 Date Initiated: 03/13/2015 Goal Status: Active Ulcer/skin breakdown will have a volume reduction of 50% by week 8 Date Initiated: 03/13/2015 Goal Status: Active Ulcer/skin breakdown will have a volume reduction of 80% by week 12 Date Initiated: 03/13/2015 Goal Status: Active Ulcer/skin breakdown will heal within 14 weeks Date Initiated: 03/13/2015 Goal Status: Active Interventions: Assess patient/caregiver ability to obtain necessary supplies Assess patient/caregiver ability to perform ulcer/skin care regimen upon admission and as needed Assess ulceration(s) every visit Provide education on smoking Provide education on ulcer and skin care Treatment Activities: Patient referred to home care : 05/09/2015 FUNDERBURKE, Mikeila Lemmie Evens ([id number]) Referred to DME Shelah Heatley for dressing supplies : 05/09/2015 Skin care regimen initiated : 05/09/2015 Topical wound management initiated : 05/09/2015 Notes: Electronic Signature(s) Signed: 05/09/2015 2:14:23 PM By: Regan Lemming BSN, RN Entered By: Regan Lemming on 05/09/2015 14:14:21 Vannest, Rayona H. ([id number]) -------------------------------------------------------------------------------- Pain Assessment Details Patient Name: [name], Elliemae H. Date of Service: 05/09/2015 1:45 PM Medical Record Number: [id number] Patient Account Number: [id number] Date of Birth/Sex: 04-13-1946 (69 y.o. Female) Treating RN: Baruch Gouty, RN, BSN, Velva Harman Primary Care Physician: Paulita Cradle Other Clinician: Referring Physician: Paulita Cradle Treating Physician/Extender: Frann Rider in Treatment: 8 Active Problems Location of Pain Severity and Description of Pain Patient Has Paino No Site Locations Pain  Management and Medication Current Pain Management: Electronic Signature(s) Signed: 05/09/2015 1:54:08 PM By: Regan Lemming BSN, RN Entered By: Regan Lemming on 05/09/2015 13:54:08 Vigo, Jase H. ([id number]) -------------------------------------------------------------------------------- Patient/Caregiver Education Details Patient Name: [name], Nicolette H. Date of Service: 05/09/2015 1:45 PM Medical Record Number: [id number] Patient Account Number: [id number]  Date of Birth/Gender: 13-Dec-1945 (69 y.o. Female) Treating RN: Afful, RN, BSN, Velva Harman Primary Care Physician: Paulita Cradle Other Clinician: Referring Physician: Paulita Cradle Treating Physician/Extender: Frann Rider in Treatment: 8 Education Assessment Education Provided To: Patient Education Topics Provided Basic Hygiene: Methods: Explain/Verbal Responses: State content correctly Pressure: Methods: Explain/Verbal Responses: State content correctly Safety: Methods: Explain/Verbal Responses: State content correctly Smoking and Wound Healing: Methods: Explain/Verbal Responses: State content correctly Welcome To [facility]: Methods: Explain/Verbal Responses: State content correctly Wound/Skin Impairment: Methods: Explain/Verbal Responses: State content correctly Electronic Signature(s) Signed: 05/09/2015 2:54:57 PM By: Regan Lemming BSN, RN Entered By: Regan Lemming on 05/09/2015 14:54:57 Kesecker, Janai H. ([id number]) -------------------------------------------------------------------------------- Wound Assessment Details Patient Name: Sachs, Maniah H. Date of Service: 05/09/2015 1:45 PM Medical Record Number: [id number] Patient Account Number: [id number] Date of Birth/Sex: 03/24/1946 (69 y.o. Female) Treating RN: Afful, RN, BSN, [place] Primary Care Physician: Paulita Cradle Other Clinician: Referring Physician: Paulita Cradle Treating Physician/Extender: Frann Rider in Treatment: 8 Wound  Status Wound Number: 1 Primary Pressure Ulcer Etiology: Wound Location: Right Calcaneous Wound Healed - Epithelialized Wounding Event: Gradually Appeared Status: Date Acquired: 02/25/2015 Comorbid Cataracts, Asthma, Hypertension, Type Weeks Of Treatment: 8 History: II Diabetes, Neuropathy, Received Clustered Wound: No Chemotherapy Photos Photo Uploaded By: Regan Lemming on 05/09/2015 15:11:11 Wound Measurements Length: (cm) 0 % Redu Width: (cm) 0 % Redu Depth: (cm) 0 Epithe Area: (cm) 0 Tunne Volume: (cm) 0 Under ction in Area: 100% ction in Volume: 100% lialization: Large (67-100%) ling: No mining: No Wound Description Classification: Category/Stage III Foul Diabetic Severity (Wagner): Grade 1 Wound Margin: Distinct, outline attached Exudate Amount: None Present Odor After Cleansing: No Wound Bed Granulation Amount: Medium (34-66%) Exposed Structure Granulation Quality: Pink, Pale Fascia Exposed: No Necrotic Amount: None Present (0%) Fat Layer Exposed: No Tendon Exposed: No Muscle Exposed: No Ungaro, Latorie H. ([id number]) Joint Exposed: No Bone Exposed: No Limited to Skin Breakdown Periwound Skin Texture Texture Color No Abnormalities Noted: No No Abnormalities Noted: No Callus: No Atrophie Blanche: No Crepitus: No Cyanosis: No Excoriation: No Ecchymosis: No Fluctuance: No Erythema: No Friable: No Hemosiderin Staining: No Induration: No Mottled: No Localized Edema: Yes Pallor: No Rash: No Rubor: No Scarring: No Temperature / Pain Moisture Temperature: No Abnormality No Abnormalities Noted: No Tenderness on Palpation: Yes Dry / Scaly: Yes Maceration: No Moist: No Wound Preparation Ulcer Cleansing: Rinsed/Irrigated with Saline Topical Anesthetic Applied: None Electronic Signature(s) Signed: 05/09/2015 2:10:58 PM By: Regan Lemming BSN, RN Entered By: Regan Lemming on 05/09/2015 14:10:57 Puglia, Tamiko H.  ([id number]) -------------------------------------------------------------------------------- Wound Assessment Details Patient Name: Simonis, Amry H. Date of Service: 05/09/2015 1:45 PM Medical Record Number: [id number] Patient Account Number: [id number] Date of Birth/Sex: [birth year] (69 y.o. Female) Treating RN: Afful, RN, BSN, [place] Primary Care Physician: Paulita Cradle Other Clinician: Referring Physician: Paulita Cradle Treating Physician/Extender: Frann Rider in Treatment: 8 Wound Status Wound Number: 2 Primary Pressure Ulcer Etiology: Wound Location: Right Elbow Wound Open Wounding Event: Gradually Appeared Status: Date Acquired: 02/25/2015 Comorbid Cataracts, Asthma, Hypertension, Type Weeks Of Treatment: 8 History: II Diabetes, Neuropathy, Received Clustered Wound: No Chemotherapy Photos Photo Uploaded By: Regan Lemming on 05/09/2015 15:11:39 Wound Measurements Length: (cm) 0.8 Width: (cm) 0.7 Depth: (cm) 0.2 Area: (cm) 0.44 Volume: (cm) 0.088 % Reduction in Area: 76.7% % Reduction in Volume: 76.7% Epithelialization: Small (1-33%) Tunneling: No Undermining: No Wound Description Classification: Category/Stage III Wound Margin: Distinct, outline attached Exudate Amount: None Present Foul Odor After Cleansing: No Wound Bed Granulation Amount: None  Present (0%) Exposed Structure Necrotic Amount: Large (67-100%) Fascia Exposed: No Necrotic Quality: Eschar Fat Layer Exposed: No Tendon Exposed: No Muscle Exposed: No Joint Exposed: No Jaroszewski, Ashe H. ([id number]) Bone Exposed: No Limited to Skin Breakdown Periwound Skin Texture Texture Color No Abnormalities Noted: No No Abnormalities Noted: No Callus: No Atrophie Blanche: No Crepitus: No Cyanosis: No Excoriation: No Ecchymosis: No Fluctuance: No Erythema: No Friable: No Hemosiderin Staining: No Induration: No Mottled: No Localized Edema: No Pallor: No Rash: No Rubor: No Scarring:  No Temperature / Pain Moisture Temperature: No Abnormality No Abnormalities Noted: No Dry / Scaly: Yes Maceration: No Moist: No Wound Preparation Ulcer Cleansing: Rinsed/Irrigated with Saline Topical Anesthetic Applied: Other: lidocaine 4%, Treatment Notes Wound #2 (Right Elbow) 1. Cleansed with: Clean wound with Normal Saline 4. Dressing Applied: Other dressing (specify in notes) 5. Secondary Dressing Applied Bordered Foam Dressing Notes betadine paint Electronic Signature(s) Signed: 05/09/2015 2:09:13 PM By: Regan Lemming BSN, RN Entered By: Regan Lemming on 05/09/2015 14:09:13 Buchinger, Tal H. ([id number]) -------------------------------------------------------------------------------- Wound Assessment Details Patient Name: Mcgrail, Levaeh H. Date of Service: 05/09/2015 1:45 PM Medical Record Number: [id number] Patient Account Number: [id number] Date of Birth/Sex: [birth year] (69 y.o. Female) Treating RN: Afful, RN, BSN, [place] Primary Care Physician: Paulita Cradle Other Clinician: Referring Physician: Paulita Cradle Treating Physician/Extender: Frann Rider in Treatment: 8 Wound Status Wound Number: 3 Primary Pressure Ulcer Etiology: Wound Location: Back - Midline Wound Open Wounding Event: Pressure Injury Status: Date Acquired: 05/02/2015 Comorbid Cataracts, Asthma, Hypertension, Type Weeks Of Treatment: 1 History: II Diabetes, Neuropathy, Received Clustered Wound: No Chemotherapy Photos Photo Uploaded By: Regan Lemming on 05/09/2015 15:11:40 Wound Measurements Length: (cm) 1.5 Width: (cm) 1.3 Depth: (cm) 0.1 Area: (cm) 1.532 Volume: (cm) 0.153 % Reduction in Area: -596.4% % Reduction in Volume: -595.5% Epithelialization: None Tunneling: No Undermining: No Wound Description Classification: Category/Stage II Wound Margin: Flat and Intact Exudate Amount: None Present Foul Odor After Cleansing: No Wound Bed Granulation Amount: Large (67-100%)  Exposed Structure Granulation Quality: Red Fascia Exposed: No Necrotic Amount: None Present (0%) Fat Layer Exposed: No Tendon Exposed: No Muscle Exposed: No Joint Exposed: No Dyches, Salaya H. ([id number]) Bone Exposed: No Limited to Skin Breakdown Periwound Skin Texture Texture Color No Abnormalities Noted: No No Abnormalities Noted: No Callus: No Atrophie Blanche: No Crepitus: No Cyanosis: No Excoriation: No Ecchymosis: No Fluctuance: No Erythema: No Friable: No Hemosiderin Staining: No Induration: No Mottled: No Localized Edema: No Pallor: No Rash: No Rubor: No Scarring: No Temperature / Pain Moisture Temperature: No Abnormality No Abnormalities Noted: No Tenderness on Palpation: Yes Dry / Scaly: Yes Maceration: No Moist: No Wound Preparation Ulcer Cleansing: Rinsed/Irrigated with Saline Treatment Notes Wound #3 (Midline Back) 1. Cleansed with: Clean wound with Normal Saline 3. Peri-wound Care: Skin Prep 5. Secondary Dressing Applied Bordered Foam Dressing Electronic Signature(s) Signed: 05/09/2015 2:10:13 PM By: Regan Lemming BSN, RN Entered By: Regan Lemming on 05/09/2015 14:10:13 Colclough, Aliany H. ([id number]) -------------------------------------------------------------------------------- Vitals Details Patient Name: [name], Theda H. Date of Service: 05/09/2015 1:45 PM Medical Record Number: [id number] Patient Account Number: [id number] Date of Birth/Sex: 1946-07-05 (69 y.o. Female) Treating RN: Afful, RN, BSN, [place] Primary Care Physician: Paulita Cradle Other Clinician: Referring Physician: Paulita Cradle Treating Physician/Extender: Frann Rider in Treatment: 8 Vital Signs Time Taken: 13:54 Temperature (F): 99.1 Height (in): 65 Pulse (bpm): 117 Weight (lbs): 118 Respiratory Rate (breaths/min): 16 Body Mass Index (BMI): 19.6 Blood Pressure (mmHg): 125/95 Reference Range: 80 - 120 mg / dl Electronic Signature(s) Signed:  05/09/2015  1:56:10 PM By: Regan Lemming BSN, RN Entered By: Regan Lemming on 05/09/2015 13:56:09

## 2015-05-09 NOTE — Progress Notes (Signed)
[name], [name] ([id number]) Visit Report for 05/09/2015 Chief Complaint Document Details Patient Name: [name], [name] Date of Service: 05/09/2015 1:45 PM Medical Record Number: [id number] Patient Account Number: [id number] Date of Birth/Sex: [birth year] (69 y.o. Female) Treating RN: [name] Primary Care Physician: [name] Other Clinician: Referring Physician: Paulita [name] Treating Physician/Extender: [name] in Treatment: 8 Information Obtained from: Patient Chief Complaint Patient presents to the wound care center for a consult due non healing wound. Ulcers on the right elbow and the right heel for about 1 month. Electronic Signature(s) Signed: 05/09/2015 2:26:37 PM By: Mckenzie Fudge MD, FACS Entered By: [name] on 05/09/2015 14:26:37 [name], [name] ([id number]) -------------------------------------------------------------------------------- HPI Details Patient Name: [name], [name] Date of Service: 05/09/2015 1:45 PM Medical Record Number: [id number] Patient Account Number: [id number] Date of Birth/Sex: 1946-09-01 (69 y.o. Female) Treating RN: [name] Primary Care Physician: [name] Other Clinician: Referring Physician: Paulita [name] Treating Physician/Extender: [name] in Treatment: 8 History of Present Illness Location: Ulceration on the right heel and the right elbow. Quality: Patient reports experiencing a dull pain to affected area(s). Severity: Patient states wound (s) are getting better. Duration: Patient has had the wound for < 4 weeks prior to presenting for treatment Timing: Pain in wound is Intermittent (comes and goes Context: The wound appeared gradually over time Modifying Factors: Consults to this date include:Augmentin and Bactrim and also some heel protection with duoderm Associated Signs and Symptoms: Patient reports having difficulty standing for long periods. HPI Description: 69 year old  female with history of peripheral neuropathy, history of diet controlled diabetes mellitus type 2, history of alcoholism here for wound consult sent by her PCP [name] She has pressure ulcers at her right elbow, bilateral heels. Plain films of right calcaneus without acute bony process. Patient started by PCP on Augmentin, Bactrim as per orders, DuoDerm dressings applied - reports some improvement in her ulcer since last seen. Denies fever, chills, nausea, vomiting, diarrhea. She had a right humerus fracture in the middle of May and has had no surgery and arm is in a sling. She is also been laying in the bed for quite a while. Past medical history significant for essential hypertension, osteoporosis, peripheral neuropathy, alcoholism, ataxia, personal history of breast cancer treated with surgery chemotherapy and radiation and this was done in December 2010. she is also status post laparoscopic cholecystectomy, pilonidal cyst excision, subcutaneous port placement, partial mastectomy on the left side, skin cancer removal. 03/21/2015 -- she says overall she's been doing better and continues to smoke about 15 cigarettes a day. 03/21/2015 - her orthopedic doctor has said she may require surgery for her right humerus fracture. 04/04/2015 -- her orthopedic surgery has been scheduled for August 11. 04/18/2015 -- she is doing fine as far as her elbow and her right heel goes but she has developed some redness over prominence on her thoracic spine and wanted me to take a look at this. 05/02/2015 -- she had her surgery done and now is in a sling and support. Her back has developed a pressure injury of undetermined stage. She seems to be in better spirits. Electronic Signature(s) Signed: 05/09/2015 2:26:42 PM By: Mckenzie Fudge MD, FACS Entered By: [name] on 05/09/2015 14:26:42 [name], [name] ([id number]) -------------------------------------------------------------------------------- Physical  Exam Details Patient Name: [name], [name] Date of Service: 05/09/2015 1:45 PM Medical Record Number: [id number] Patient Account Number: [id number] Date of Birth/Sex: 08/23/1946 (69 y.o. Female) Treating RN: [name] Primary Care Physician: [name]  Other Clinician: Referring Physician: Carrie Mew, [name] Treating Physician/Extender: [name] in Treatment: 8 Constitutional . Pulse regular. Respirations normal and unlabored. Afebrile. . Eyes Nonicteric. Reactive to light. Ears, Nose, Mouth, and Throat Lips, teeth, and gums WNL.Marland Kitchen Moist mucosa without lesions . Neck supple and nontender. No palpable supraclavicular or cervical adenopathy. Normal sized without goiter. Respiratory WNL. No retractions.. Cardiovascular Pedal Pulses WNL. No clubbing, cyanosis or edema. Lymphatic No adneopathy. No adenopathy. No adenopathy. Musculoskeletal Adexa without tenderness or enlargement.. Digits and nails w/o clubbing, cyanosis, infection, petechiae, ischemia, or inflammatory conditions.. Integumentary (Hair, Skin) No suspicious lesions. No crepitus or fluctuance. No peri-wound warmth or erythema. No masses.Marland Kitchen Psychiatric Judgement and insight Intact.. No evidence of depression, anxiety, or agitation.. Notes the right heel has completely healed and there is no area open. The right elbow is still dry eschar and we will paint this with beta [name] The thoracic spine area is now a stage I pressure ulcer Electronic Signature(s) Signed: 05/09/2015 2:27:24 PM By: Mckenzie Fudge MD, FACS Entered By: [name] on 05/09/2015 14:27:24 [name], [name] ([id number]) -------------------------------------------------------------------------------- Physician Orders Details Patient Name: [name], [name] Date of Service: 05/09/2015 1:45 PM Medical Record Patient Account Number: [id number] Number: [name], Treating RN: 07/26/1946 (69 y.o. [name] Date of Birth/Sex: Female)  Other Clinician: Primary Care Physician: [name] Treating [name] Referring Physician: Paulita [name] Physician/Extender: [name] in Treatment: 8 Verbal / Phone Orders: Yes Clinician: Afful, RN, [name], Rita Read Back and Verified: Yes Diagnosis Coding Wound Cleansing Wound #2 Right Elbow o Cleanse wound with mild soap and water o May Shower, gently pat wound dry prior to applying new dressing. o May shower with protection. Wound #3 Midline Back o Cleanse wound with mild soap and water o May Shower, gently pat wound dry prior to applying new dressing. o May shower with protection. Primary Wound Dressing Wound #2 Right Elbow o Other: - betadine paint Secondary Dressing Wound #2 Right Elbow o Boardered Foam Dressing Wound #3 Midline Back o Boardered Foam Dressing Dressing Change Frequency Wound #2 Right Elbow o Change dressing every other day. Wound #3 Midline Back o Change dressing every other day. Follow-up Appointments Wound #2 Right Elbow o Return Appointment in 1 week. Wound #3 Midline Back Scoles, Ting H. ([id number]) o Return Appointment in 1 week. Off-Loading Wound #3 Midline Back o Turn and reposition every 2 hours Home Health Wound #2 Right Elbow o [place] Visits - [place] Nurse may visit PRN to address patientos wound care needs. o FACE TO FACE ENCOUNTER: MEDICARE and MEDICAID PATIENTS: I certify that this patient is under my care and that I had a face-to-face encounter that meets the physician face-to-face encounter requirements with this patient on this date. The encounter with the patient was in whole or in part for the following MEDICAL CONDITION: (primary reason for [place]) MEDICAL NECESSITY: I certify, that based on my findings, NURSING services are a medically necessary home health service. HOME BOUND STATUS: I certify that my clinical findings support that this  patient is homebound (i.e., Due to illness or injury, pt requires aid of supportive devices such as crutches, cane, wheelchairs, walkers, the use of special transportation or the assistance of another person to leave their place of residence. There is a normal inability to leave the home and doing so requires considerable and taxing effort. Other absences are for medical reasons / religious services and are infrequent or of short duration when for other  reasons). o If current dressing causes regression in wound condition, may D/C ordered dressing product/s and apply Normal Saline Moist Dressing daily until next [place] / Other MD appointment. [place] of regression in wound condition at [phone]. o Please direct any NON-WOUND related issues/requests for orders to patient's Primary Care Physician Wound #3 Midline Back o [place] Visits - [place] Nurse may visit PRN to address patientos wound care needs. o FACE TO FACE ENCOUNTER: MEDICARE and MEDICAID PATIENTS: I certify that this patient is under my care and that I had a face-to-face encounter that meets the physician face-to-face encounter requirements with this patient on this date. The encounter with the patient was in whole or in part for the following MEDICAL CONDITION: (primary reason for [place]) MEDICAL NECESSITY: I certify, that based on my findings, NURSING services are a medically necessary home health service. HOME BOUND STATUS: I certify that my clinical findings support that this patient is homebound (i.e., Due to illness or injury, pt requires aid of supportive devices such as crutches, cane, wheelchairs, walkers, the use of special transportation or the assistance of another person to leave their place of residence. There is a normal inability to leave the home and doing so requires considerable and taxing effort. Other absences are for medical  reasons / religious services and are infrequent or of short duration when for other reasons). o If current dressing causes regression in wound condition, may D/C ordered dressing product/s and apply Normal Saline Moist Dressing daily until next [place] / Other MD appointment. [place] of regression in wound condition at [phone]. o Please direct any NON-WOUND related issues/requests for orders to patient's Primary Care Physician [place], Jammi HMarland Kitchen ([id number]) Electronic Signature(s) Signed: 05/09/2015 2:19:47 PM By: Regan Lemming BSN, RN Signed: 05/09/2015 2:53:21 PM By: Mckenzie Fudge MD, FACS Entered By: Regan Lemming on 05/09/2015 14:19:47 [place], Annalisia H. ([id number]) -------------------------------------------------------------------------------- Problem List Details Patient Name: Mullings, Mone H. Date of Service: 05/09/2015 1:45 PM Medical Record Number: [id number] Patient Account Number: [id number] Date of Birth/Sex: Feb 07, 1946 (69 y.o. Female) Treating RN: [name] Primary Care Physician: [name] Other Clinician: Referring Physician: Paulita [name] Treating Physician/Extender: [name] in Treatment: 8 Active Problems ICD-10 Encounter Code Description Active Date Diagnosis E11.621 Type 2 diabetes mellitus with foot ulcer 03/13/2015 Yes L89.613 Pressure ulcer of right heel, stage 3 03/13/2015 Yes L89.013 Pressure ulcer of right elbow, stage 3 03/13/2015 Yes F17.218 Nicotine dependence, cigarettes, with other nicotine- 03/13/2015 Yes induced disorders L89.100 Pressure ulcer of unspecified part of back, unstageable 05/02/2015 Yes Inactive Problems Resolved Problems Electronic Signature(s) Signed: 05/09/2015 2:26:30 PM By: Mckenzie Fudge MD, FACS Entered By: [name] on 05/09/2015 14:26:30 [place], Zuriah H. ([id number]) -------------------------------------------------------------------------------- Progress Note  Details Patient Name: Mcclellan, Halle H. Date of Service: 05/09/2015 1:45 PM Medical Record Number: [id number] Patient Account Number: [id number] Date of Birth/Sex: 1945-12-07 (69 y.o. Female) Treating RN: [name] Primary Care Physician: [name] Other Clinician: Referring Physician: Paulita [name] Treating Physician/Extender: [name] in Treatment: 8 Subjective Chief Complaint Information obtained from Patient Patient presents to the wound care center for a consult due non healing wound. Ulcers on the right elbow and the right heel for about 1 month. History of Present Illness (HPI) The following HPI elements were documented for the patient's wound: Location: Ulceration on the right heel and the right elbow. Quality: Patient reports experiencing a dull pain to affected area(s). Severity: Patient  states wound (s) are getting better. Duration: Patient has had the wound for < 4 weeks prior to presenting for treatment Timing: Pain in wound is Intermittent (comes and goes Context: The wound appeared gradually over time Modifying Factors: Consults to this date include:Augmentin and Bactrim and also some heel protection with duoderm Associated Signs and Symptoms: Patient reports having difficulty standing for long periods. 69 year old female with history of peripheral neuropathy, history of diet controlled diabetes mellitus type 2, history of alcoholism here for wound consult sent by her PCP [name] She has pressure ulcers at her right elbow, bilateral heels. Plain films of right calcaneus without acute bony process. Patient started by PCP on Augmentin, Bactrim as per orders, DuoDerm dressings applied - reports some improvement in her ulcer since last seen. Denies fever, chills, nausea, vomiting, diarrhea. She had a right humerus fracture in the middle of May and has had no surgery and arm is in a sling. She is also been laying in the bed for quite a while. Past  medical history significant for essential hypertension, osteoporosis, peripheral neuropathy, alcoholism, ataxia, personal history of breast cancer treated with surgery chemotherapy and radiation and this was done in December 2010. she is also status post laparoscopic cholecystectomy, pilonidal cyst excision, subcutaneous port placement, partial mastectomy on the left side, skin cancer removal. 03/21/2015 -- she says overall she's been doing better and continues to smoke about 15 cigarettes a day. 03/21/2015 - her orthopedic doctor has said she may require surgery for her right humerus fracture. 04/04/2015 -- her orthopedic surgery has been scheduled for August 11. 04/18/2015 -- she is doing fine as far as her elbow and her right heel goes but she has developed some redness over prominence on her thoracic spine and wanted me to take a look at this. [name], [name] ([id number]) 05/02/2015 -- she had her surgery done and now is in a sling and support. Her back has developed a pressure injury of undetermined stage. She seems to be in better spirits. Objective Constitutional Pulse regular. Respirations normal and unlabored. Afebrile. Vitals Time Taken: 1:54 PM, Height: 65 in, Weight: 118 lbs, BMI: 19.6, Temperature: 99.1 F, Pulse: 117 bpm, Respiratory Rate: 16 breaths/min, Blood Pressure: 125/95 mmHg. Eyes Nonicteric. Reactive to light. Ears, Nose, Mouth, and Throat Lips, teeth, and gums WNL.Marland Kitchen Moist mucosa without lesions . Neck supple and nontender. No palpable supraclavicular or cervical adenopathy. Normal sized without goiter. Respiratory WNL. No retractions.. Cardiovascular Pedal Pulses WNL. No clubbing, cyanosis or edema. Lymphatic No adneopathy. No adenopathy. No adenopathy. Musculoskeletal Adexa without tenderness or enlargement.. Digits and nails w/o clubbing, cyanosis, infection, petechiae, ischemia, or inflammatory conditions.Marland Kitchen Psychiatric Judgement and insight Intact.. No  evidence of depression, anxiety, or agitation.. General Notes: the right heel has completely healed and there is no area open. The right elbow is still dry eschar and we will paint this with beta [name] The thoracic spine area is now a stage I pressure ulcer Integumentary (Hair, Skin) No suspicious lesions. No crepitus or fluctuance. No peri-wound warmth or erythema. No masses.. Wound #1 status is Healed - Epithelialized. Original cause of wound was Gradually Appeared. The wound [name], [name] ([id number]) is located on the Right Calcaneous. The wound measures 0cm length x 0cm width x 0cm depth; 0cm^2 area and 0cm^3 volume. The wound is limited to skin breakdown. There is no tunneling or undermining noted. There is a none present amount of drainage noted. The wound margin is distinct with the outline attached to the  wound base. There is medium (34-66%) pink, pale granulation within the wound bed. There is no necrotic tissue within the wound bed. The periwound skin appearance exhibited: Localized Edema, Dry/Scaly. The periwound skin appearance did not exhibit: Callus, Crepitus, Excoriation, Fluctuance, Friable, Induration, Rash, Scarring, Maceration, Moist, Atrophie Blanche, Cyanosis, Ecchymosis, Hemosiderin Staining, Mottled, Pallor, Rubor, Erythema. Periwound temperature was noted as No Abnormality. The periwound has tenderness on palpation. Wound #2 status is Open. Original cause of wound was Gradually Appeared. The wound is located on the Right Elbow. The wound measures 0.8cm length x 0.7cm width x 0.2cm depth; 0.44cm^2 area and 0.088cm^3 volume. The wound is limited to skin breakdown. There is no tunneling or undermining noted. There is a none present amount of drainage noted. The wound margin is distinct with the outline attached to the wound base. There is no granulation within the wound bed. There is a large (67-100%) amount of necrotic tissue within the wound bed including Eschar. The  periwound skin appearance exhibited: Dry/Scaly. The periwound skin appearance did not exhibit: Callus, Crepitus, Excoriation, Fluctuance, Friable, Induration, Localized Edema, Rash, Scarring, Maceration, Moist, Atrophie Blanche, Cyanosis, Ecchymosis, Hemosiderin Staining, Mottled, Pallor, Rubor, Erythema. Periwound temperature was noted as No Abnormality. Wound #3 status is Open. Original cause of wound was Pressure Injury. The wound is located on the Midline Back. The wound measures 1.5cm length x 1.3cm width x 0.1cm depth; 1.532cm^2 area and 0.153cm^3 volume. The wound is limited to skin breakdown. There is no tunneling or undermining noted. There is a none present amount of drainage noted. The wound margin is flat and intact. There is large (67- 100%) red granulation within the wound bed. There is no necrotic tissue within the wound bed. The periwound skin appearance exhibited: Dry/Scaly. The periwound skin appearance did not exhibit: Callus, Crepitus, Excoriation, Fluctuance, Friable, Induration, Localized Edema, Rash, Scarring, Maceration, Moist, Atrophie Blanche, Cyanosis, Ecchymosis, Hemosiderin Staining, Mottled, Pallor, Rubor, Erythema. Periwound temperature was noted as No Abnormality. The periwound has tenderness on palpation. Assessment Active Problems ICD-10 E11.621 - Type 2 diabetes mellitus with foot ulcer L89.613 - Pressure ulcer of right heel, stage 3 L89.013 - Pressure ulcer of right elbow, stage 3 F17.218 - Nicotine dependence, cigarettes, with other nicotine-induced disorders L89.100 - Pressure ulcer of unspecified part of back, unstageable Beg, Rosamary H. ([id number]) I have recommended protection of both heels and off loading. As far as the right elbow goes we will use some Betadine paint. On the thoracic spine where there is a pressure ulcer stage I-recommended foam offloading and constant off loading with positional changes. She and her husband both are wanting to  be very compliant and the patient is going to work on completely quitting smoking. Plan Wound Cleansing: Wound #2 Right Elbow: Cleanse wound with mild soap and water May Shower, gently pat wound dry prior to applying new dressing. May shower with protection. Wound #3 Midline Back: Cleanse wound with mild soap and water May Shower, gently pat wound dry prior to applying new dressing. May shower with protection. Primary Wound Dressing: Wound #2 Right Elbow: Other: - betadine paint Secondary Dressing: Wound #2 Right Elbow: Boardered Foam Dressing Wound #3 Midline Back: Boardered Foam Dressing Dressing Change Frequency: Wound #2 Right Elbow: Change dressing every other day. Wound #3 Midline Back: Change dressing every other day. Follow-up Appointments: Wound #2 Right Elbow: Return Appointment in 1 week. Wound #3 Midline Back: Return Appointment in 1 week. Off-Loading: Wound #3 Midline Back: Turn and reposition every 2 hours Home Health: Wound #  2 Right Elbow: Continue Home Health Visits - [facility] Nurse may visit PRN to address patient s wound care needs. FACE TO FACE ENCOUNTER: MEDICARE and MEDICAID PATIENTS: I certify that this patient is under my care and that I had a face-to-face encounter that meets the physician face-to-face encounter requirements with this patient on this date. The encounter with the patient was in whole or in part for the following MEDICAL CONDITION: (primary reason for [place]) MEDICAL NECESSITY: I certify, that based on my findings, NURSING services are a medically necessary home health service. HOME BOUND STATUS: I certify that my clinical findings support that this patient is homebound (i.e., Due to illness or injury, pt requires aid of supportive devices such as crutches, cane, wheelchairs, walkers, the use of special transportation or the assistance of another person to leave their place of residence. There is a Voges, Matilynn H.  ([id number]) normal inability to leave the home and doing so requires considerable and taxing effort. Other absences are for medical reasons / religious services and are infrequent or of short duration when for other reasons). If current dressing causes regression in wound condition, may D/C ordered dressing product/s and apply Normal Saline Moist Dressing daily until next [place] / Other MD appointment. [place] of regression in wound condition at [phone]. Please direct any NON-WOUND related issues/requests for orders to patient's Primary Care Physician Wound #3 Midline Back: [place] Visits - [facility] Nurse may visit PRN to address patient s wound care needs. FACE TO FACE ENCOUNTER: MEDICARE and MEDICAID PATIENTS: I certify that this patient is under my care and that I had a face-to-face encounter that meets the physician face-to-face encounter requirements with this patient on this date. The encounter with the patient was in whole or in part for the following MEDICAL CONDITION: (primary reason for [place]) MEDICAL NECESSITY: I certify, that based on my findings, NURSING services are a medically necessary home health service. HOME BOUND STATUS: I certify that my clinical findings support that this patient is homebound (i.e., Due to illness or injury, pt requires aid of supportive devices such as crutches, cane, wheelchairs, walkers, the use of special transportation or the assistance of another person to leave their place of residence. There is a normal inability to leave the home and doing so requires considerable and taxing effort. Other absences are for medical reasons / religious services and are infrequent or of short duration when for other reasons). If current dressing causes regression in wound condition, may D/C ordered dressing product/s and apply Normal Saline Moist Dressing daily until next [place] /  Other MD appointment. [place] of regression in wound condition at [phone]. Please direct any NON-WOUND related issues/requests for orders to patient's Primary Care Physician I have recommended protection of both heels and off loading. As far as the right elbow goes we will use some Betadine paint. On the thoracic spine where there is a pressure ulcer stage I-recommended foam offloading and constant off loading with positional changes. She and her husband both are wanting to be very compliant and the patient is going to work on completely quitting smoking. Electronic Signature(s) Signed: 05/09/2015 2:28:22 PM By: Mckenzie Fudge MD, FACS Entered By: [name] on 05/09/2015 14:28:22 [place], [place] H. ([id number]) -------------------------------------------------------------------------------- SuperBill Details Patient Name: [name], Fama H. Date of Service: 05/09/2015 Medical Record Number: [id number] Patient Account Number: [id number] Date of Birth/Sex: 1945/10/08 (69 y.o. 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, stage 3 L89.013 Pressure ulcer of right elbow, stage 3 F17.218 Nicotine dependence, cigarettes, with other nicotine-induced disorders L89.100 Pressure ulcer of unspecified part of back, unstageable Facility Procedures CPT4 Code: 75883254 Description: 99213 - WOUND CARE VISIT-LEV 3 EST PT Modifier: Quantity: 1 Physician Procedures CPT4 Code: 9826415 Description: 83094 - WC PHYS LEVEL 3 - EST PT ICD-10 Description Diagnosis 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

## 2015-05-16 ENCOUNTER — Encounter: Payer: Medicare Other | Attending: Surgery | Admitting: Surgery

## 2015-05-16 DIAGNOSIS — L89613 Pressure ulcer of right heel, stage 3: Secondary | ICD-10-CM | POA: Insufficient documentation

## 2015-05-16 DIAGNOSIS — E11621 Type 2 diabetes mellitus with foot ulcer: Secondary | ICD-10-CM | POA: Insufficient documentation

## 2015-05-16 DIAGNOSIS — F17218 Nicotine dependence, cigarettes, with other nicotine-induced disorders: Secondary | ICD-10-CM | POA: Diagnosis not present

## 2015-05-16 DIAGNOSIS — I10 Essential (primary) hypertension: Secondary | ICD-10-CM | POA: Insufficient documentation

## 2015-05-16 DIAGNOSIS — L89013 Pressure ulcer of right elbow, stage 3: Secondary | ICD-10-CM | POA: Insufficient documentation

## 2015-05-16 DIAGNOSIS — L89100 Pressure ulcer of unspecified part of back, unstageable: Secondary | ICD-10-CM | POA: Insufficient documentation

## 2015-05-16 NOTE — Progress Notes (Signed)
TOOL, Artrice H. ([id number]) Visit Report for 05/16/2015 Arrival Information Details Patient Name: NAHM, Brittainy H. Date of Service: 05/16/2015 1:45 PM Medical Record Number: [id number] Patient Account Number: [id number] Date of Birth/Sex: 01-26-1946 (69 y.o. Female) Treating RN: Afful, RN, BSN, Velva Harman Primary Care Physician: Paulita Cradle Other Clinician: Referring Physician: Paulita Cradle Treating Physician/Extender: Frann Rider in Treatment: 9 Visit Information History Since Last Visit Any new allergies or adverse reactions: No Patient Arrived: Wheel Chair Had a fall or experienced change in No activities of daily living that may affect Arrival Time: 13:49 risk of falls: Accompanied By: husband Signs or symptoms of abuse/neglect since last No Transfer Assistance: None visito Patient Identification Verified: Yes Hospitalized since last visit: No Secondary Verification Process Yes Has Dressing in Place as Prescribed: Yes Completed: Pain Present Now: No Patient Requires Transmission-Based No Precautions: Patient Has Alerts: No Electronic Signature(s) Signed: 05/16/2015 1:49:57 PM By: Regan Lemming BSN, RN Entered By: Regan Lemming on 05/16/2015 13:49:57 Butson, Nahiara H. ([id number]) -------------------------------------------------------------------------------- Encounter Discharge Information Details Patient Name: [name], Miasha H. Date of Service: 05/16/2015 1:45 PM Medical Record Number: [id number] Patient Account Number: [id number] Date of Birth/Sex: 1946/04/05 (69 y.o. Female) Treating RN: Afful, RN, BSN, Velva Harman Primary Care Physician: Paulita Cradle Other Clinician: Referring Physician: Paulita Cradle Treating Physician/Extender: Frann Rider in Treatment: 9 Encounter Discharge Information Items Discharge Pain Level: 0 Discharge Condition: Stable Ambulatory Status: Wheelchair Discharge Destination: Home Transportation: Private Auto Accompanied By:  husband Schedule Follow-up Appointment: No Medication Reconciliation completed No and provided to Patient/Care Shuntell Foody: Provided on Clinical Summary of Care: 05/16/2015 Form Type Recipient Paper Patient AS Electronic Signature(s) Signed: 05/16/2015 2:34:43 PM By: Regan Lemming BSN, RN Previous Signature: 05/16/2015 2:30:07 PM Version By: Ruthine Dose Entered By: Regan Lemming on 05/16/2015 14:34:43 Sebo, Marli H. ([id number]) -------------------------------------------------------------------------------- Lower Extremity Assessment Details Patient Name: Nilsson, Kirk H. Date of Service: 05/16/2015 1:45 PM Medical Record Number: [id number] Patient Account Number: [id number] Date of Birth/Sex: March 03, 1946 (69 y.o. Female) Treating RN: Afful, RN, BSN, Velva Harman Primary Care Physician: Paulita Cradle Other Clinician: Referring Physician: Paulita Cradle Treating Physician/Extender: Frann Rider in Treatment: 9 Electronic Signature(s) Signed: 05/16/2015 1:53:28 PM By: Regan Lemming BSN, RN Entered By: Regan Lemming on 05/16/2015 13:53:27 Doi, Esha H. ([id number]) -------------------------------------------------------------------------------- Multi Wound Chart Details Patient Name: [name], Wonda H. Date of Service: 05/16/2015 1:45 PM Medical Record Number: [id number] Patient Account Number: [id number] Date of Birth/Sex: [birth year] (69 y.o. Female) Treating RN: Baruch Gouty, RN, BSN, Velva Harman Primary Care Physician: Paulita Cradle Other Clinician: Referring Physician: Paulita Cradle Treating Physician/Extender: Frann Rider in Treatment: 9 Vital Signs Height(in): 65 Pulse(bpm): 115 Weight(lbs): 118 Blood Pressure 117/70 (mmHg): Body Mass Index(BMI): 20 Temperature(F): 98.6 Respiratory Rate 18 (breaths/min): Photos: [2:No Photos] [3:No Photos] [4:No Photos] Wound Location: [2:Right Elbow] [3:Back - Midline] [4:Right Calcaneous] Wounding Event: [2:Gradually Appeared]  [3:Pressure Injury] [4:Pressure Injury] Primary Etiology: [2:Pressure Ulcer] [3:Pressure Ulcer] [4:Pressure Ulcer] Comorbid History: [2:Cataracts, Asthma, Hypertension, Type II Diabetes, Neuropathy, Received Chemotherapy] [3:Cataracts, Asthma, Hypertension, Type II Diabetes, Neuropathy, Received Chemotherapy] [4:Cataracts, Asthma, Hypertension, Type II Diabetes,  Neuropathy, Received Chemotherapy] Date Acquired: [2:02/25/2015] [3:05/02/2015] [4:05/11/2015] Weeks of Treatment: [2:9] [3:2] [4:0] Wound Status: [2:Open] [3:Open] [4:Open] Measurements L x W x D 1x0.5x0.1 [3:3x2x0.1] [4:2.3x2x0.1] (cm) Area (cm) : [2:0.393] [3:4.712] [4:3.613] Volume (cm) : [2:0.039] [3:0.471] [4:0.361] % Reduction in Area: [2:79.20%] [3:-2041.80%] [4:0.00%] % Reduction in Volume: 89.70% [3:-2040.90%] [4:0.00%] Classification: [2:Category/Stage III] [3:Category/Stage II] [4:Unstageable/Unclassified] HBO Classification: [2:N/A] [3:N/A] [4:Unable to visualize wound bed] Exudate Amount: [2:Small] [3:Medium] [4:None Present]  Exudate Type: [2:Serosanguineous] [3:N/A] [4:N/A] Exudate Color: [2:red, brown] [3:N/A] [4:N/A] Wound Margin: [2:Distinct, outline attached] [3:Flat and Intact] [4:Distinct, outline attached] Granulation Amount: [2:None Present (0%)] [3:None Present (0%)] [4:None Present (0%)] Necrotic Amount: [2:Large (67-100%)] [3:Large (67-100%)] [4:Large (67-100%)] Necrotic Tissue: [2:Adherent Slough] [3:Eschar] [4:Eschar] Exposed Structures: [2:Fascia: No Fat: No Tendon: No] [3:Fascia: No Fat: No Tendon: No] [4:N/A] Muscle: No Muscle: No Joint: No Joint: No Bone: No Bone: No Limited to Skin Limited to Skin Breakdown Breakdown Epithelialization: Small (1-33%) None None Periwound Skin Texture: Edema: No Edema: No Edema: No Excoriation: No Excoriation: No Excoriation: No Induration: No Induration: No Induration: No Callus: No Callus: No Callus: No Crepitus: No Crepitus: No Crepitus:  No Fluctuance: No Fluctuance: No Fluctuance: No Friable: No Friable: No Friable: No Rash: No Rash: No Rash: No Scarring: No Scarring: No Scarring: No Periwound Skin Moist: Yes Moist: Yes Dry/Scaly: Yes Moisture: Maceration: No Maceration: No Maceration: No Dry/Scaly: No Dry/Scaly: No Moist: No Periwound Skin Color: Atrophie Blanche: No Atrophie Blanche: No Atrophie Blanche: No Cyanosis: No Cyanosis: No Cyanosis: No Ecchymosis: No Ecchymosis: No Ecchymosis: No Erythema: No Erythema: No Erythema: No Hemosiderin Staining: No Hemosiderin Staining: No Hemosiderin Staining: No Mottled: No Mottled: No Mottled: No Pallor: No Pallor: No Pallor: No Rubor: No Rubor: No Rubor: No Temperature: No Abnormality No Abnormality No Abnormality Tenderness on Yes Yes Yes Palpation: Wound Preparation: Ulcer Cleansing: Ulcer Cleansing: Ulcer Cleansing: Rinsed/Irrigated with Rinsed/Irrigated with Rinsed/Irrigated with Saline Saline Saline Topical Anesthetic Topical Anesthetic Topical Anesthetic Applied: Other: lidocaine Applied: Other: lidocaine Applied: Other: Lidocaine 4% 4% 4% Treatment Notes Electronic Signature(s) Signed: 05/16/2015 2:29:55 PM By: Regan Lemming BSN, RN Entered By: Regan Lemming on 05/16/2015 14:29:55 Alter, Graci H. ([id number]) -------------------------------------------------------------------------------- Multi-Disciplinary Care Plan Details Patient Name: [name], Klynn H. Date of Service: 05/16/2015 1:45 PM Medical Record Number: [id number] Patient Account Number: [id number] Date of Birth/Sex: 1946/03/05 (69 y.o. Female) Treating RN: Afful, RN, BSN, Velva Harman Primary Care Physician: Paulita Cradle Other Clinician: Referring Physician: Paulita Cradle Treating Physician/Extender: Frann Rider in Treatment: 9 Active Inactive Abuse / Safety / Falls / Self Care Management Nursing Diagnoses: Impaired home maintenance Impaired physical  mobility Knowledge deficit related to: safety; personal, health (wound), emergency Potential for falls Self care deficit: actual or potential Goals: Patient will remain injury free Date Initiated: 03/13/2015 Goal Status: Active Patient/caregiver will verbalize understanding of skin care regimen Date Initiated: 03/13/2015 Goal Status: Active Patient/caregiver will verbalize/demonstrate measure taken to improve self care Date Initiated: 03/13/2015 Goal Status: Active Patient/caregiver will verbalize/demonstrate measures taken to improve the patient's personal safety Date Initiated: 03/13/2015 Goal Status: Active Patient/caregiver will verbalize/demonstrate measures taken to prevent injury and/or falls Date Initiated: 03/13/2015 Goal Status: Active Patient/caregiver will verbalize/demonstrate understanding of what to do in case of emergency Date Initiated: 03/13/2015 Goal Status: Active Interventions: Assess fall risk on admission and as needed Assess: immobility, friction, shearing, incontinence upon admission and as needed Assess impairment of mobility on admission and as needed per policy Assess self care needs on admission and as needed Provide education on basic hygiene Thai, [place]. ([id number]) Provide education on fall prevention Provide education on personal and home safety Provide education on safe transfers Treatment Activities: Education provided on Basic Hygiene : 03/13/2015 Notes: Orientation to the Wound Care Program Nursing Diagnoses: Knowledge deficit related to the wound healing center program Goals: Patient/caregiver will verbalize understanding of the [facility] Date Initiated: 03/13/2015 Goal Status: Active Interventions: Provide education on orientation to the wound center Notes:  Pressure Nursing Diagnoses: Knowledge deficit related to causes and risk factors for pressure ulcer development Knowledge deficit related to management of  pressures ulcers Potential for impaired tissue integrity related to pressure, friction, moisture, and shear Goals: Patient will remain free from development of additional pressure ulcers Date Initiated: 03/13/2015 Goal Status: Active Patient will remain free of pressure ulcers Date Initiated: 03/13/2015 Goal Status: Active Patient/caregiver will verbalize risk factors for pressure ulcer development Date Initiated: 03/13/2015 Goal Status: Active Patient/caregiver will verbalize understanding of pressure ulcer management Date Initiated: 03/13/2015 Goal Status: Active Interventions: Assess: immobility, friction, shearing, incontinence upon admission and as needed Rainbow, Astra H. ([id number]) Assess offloading mechanisms upon admission and as needed Assess potential for pressure ulcer upon admission and as needed Provide education on pressure ulcers Treatment Activities: Patient referred for home evaluation of offloading devices/mattresses : 05/16/2015 Patient referred for pressure reduction/relief devices : 05/16/2015 Patient referred for seating evaluation to ensure proper offloading : 05/16/2015 Pressure reduction/relief device ordered : 05/16/2015 Test ordered outside of clinic : 05/16/2015 Notes: Wound/Skin Impairment Nursing Diagnoses: Impaired tissue integrity Knowledge deficit related to ulceration/compromised skin integrity Goals: Patient/caregiver will verbalize understanding of skin care regimen Date Initiated: 03/13/2015 Goal Status: Active Ulcer/skin breakdown will have a volume reduction of 30% by week 4 Date Initiated: 03/13/2015 Goal Status: Active Ulcer/skin breakdown will have a volume reduction of 50% by week 8 Date Initiated: 03/13/2015 Goal Status: Active Ulcer/skin breakdown will have a volume reduction of 80% by week 12 Date Initiated: 03/13/2015 Goal Status: Active Ulcer/skin breakdown will heal within 14 weeks Date Initiated: 03/13/2015 Goal Status:  Active Interventions: Assess patient/caregiver ability to obtain necessary supplies Assess patient/caregiver ability to perform ulcer/skin care regimen upon admission and as needed Assess ulceration(s) every visit Provide education on smoking Provide education on ulcer and skin care Treatment Activities: Patient referred to home care : 05/16/2015 SHILL, Khamari H. ([id number]) Referred to DME [facility] for dressing supplies : 05/16/2015 Skin care regimen initiated : 05/16/2015 Topical wound management initiated : 05/16/2015 Notes: Electronic Signature(s) Signed: 05/16/2015 2:29:47 PM By: Regan Lemming BSN, RN Entered By: Regan Lemming on 05/16/2015 14:29:47 Skidgel, Frayda H. ([id number]) -------------------------------------------------------------------------------- Pain Assessment Details Patient Name: [name], Donasia H. Date of Service: 05/16/2015 1:45 PM Medical Record Number: [id number] Patient Account Number: [id number] Date of Birth/Sex: 1946-08-21 (69 y.o. Female) Treating RN: Baruch Gouty, RN, BSN, Velva Harman Primary Care Physician: Paulita Cradle Other Clinician: Referring Physician: Paulita Cradle Treating Physician/Extender: Frann Rider in Treatment: 9 Active Problems Location of Pain Severity and Description of Pain Patient Has Paino No Site Locations Pain Management and Medication Current Pain Management: Electronic Signature(s) Signed: 05/16/2015 1:50:11 PM By: Regan Lemming BSN, RN Entered By: Regan Lemming on 05/16/2015 13:50:11 Brunner, Kimerly H. ([id number]) -------------------------------------------------------------------------------- Patient/Caregiver Education Details Patient Name: [name], Seana H. Date of Service: 05/16/2015 1:45 PM Medical Record Number: [id number] Patient Account Number: [id number] Date of Birth/Gender: 04-02-1946 (69 y.o. Female) Treating RN: Afful, RN, BSN, Velva Harman Primary Care Physician: Paulita Cradle Other Clinician: Referring Physician: Paulita Cradle Treating Physician/Extender: Frann Rider in Treatment: 9 Education Assessment Education Provided To: Patient Education Topics Provided Basic Hygiene: Methods: Explain/Verbal Responses: State content correctly Pressure: Methods: Explain/Verbal Responses: State content correctly Safety: Methods: Explain/Verbal Responses: State content correctly Smoking and Wound Healing: Methods: Explain/Verbal Responses: State content correctly Welcome To [facility]: Methods: Explain/Verbal Responses: State content correctly Wound/Skin Impairment: Methods: Explain/Verbal Responses: State content correctly Electronic Signature(s) Signed: 05/16/2015 2:35:11 PM By: Regan Lemming BSN, RN Entered By: Regan Lemming  on 05/16/2015 14:35:11 Wichert, Arliss H. ([id number]) -------------------------------------------------------------------------------- Wound Assessment Details Patient Name: Beagle, Duyen H. Date of Service: 05/16/2015 1:45 PM Medical Record Number: [id number] Patient Account Number: [id number] Date of Birth/Sex: June 03, 1946 (69 y.o. Female) Treating RN: Afful, RN, BSN, [place] Primary Care Physician: Paulita Cradle Other Clinician: Referring Physician: Paulita Cradle Treating Physician/Extender: Frann Rider in Treatment: 9 Wound Status Wound Number: 2 Primary Pressure Ulcer Etiology: Wound Location: Right Elbow Wound Open Wounding Event: Gradually Appeared Status: Date Acquired: 02/25/2015 Comorbid Cataracts, Asthma, Hypertension, Type Weeks Of Treatment: 9 History: II Diabetes, Neuropathy, Received Clustered Wound: No Chemotherapy Photos Photo Uploaded By: Regan Lemming on 05/16/2015 16:12:28 Wound Measurements Length: (cm) 1 Width: (cm) 0.5 Depth: (cm) 0.1 Area: (cm) 0.393 Volume: (cm) 0.039 % Reduction in Area: 79.2% % Reduction in Volume: 89.7% Epithelialization: Small (1-33%) Tunneling: No Undermining: No Wound  Description Classification: Category/Stage III Wound Margin: Distinct, outline attached Exudate Amount: Small Exudate Type: Serosanguineous Exudate Color: red, brown Foul Odor After Cleansing: No Wound Bed Granulation Amount: None Present (0%) Exposed Structure Necrotic Amount: Large (67-100%) Fascia Exposed: No Necrotic Quality: Adherent Slough Fat Layer Exposed: No Tendon Exposed: No Lubke, Shenia H. ([id number]) Muscle Exposed: No Joint Exposed: No Bone Exposed: No Limited to Skin Breakdown Periwound Skin Texture Texture Color No Abnormalities Noted: No No Abnormalities Noted: No Callus: No Atrophie Blanche: No Crepitus: No Cyanosis: No Excoriation: No Ecchymosis: No Fluctuance: No Erythema: No Friable: No Hemosiderin Staining: No Induration: No Mottled: No Localized Edema: No Pallor: No Rash: No Rubor: No Scarring: No Temperature / Pain Moisture Temperature: No Abnormality No Abnormalities Noted: No Tenderness on Palpation: Yes Dry / Scaly: No Maceration: No Moist: Yes Wound Preparation Ulcer Cleansing: Rinsed/Irrigated with Saline Topical Anesthetic Applied: Other: lidocaine 4%, Treatment Notes Wound #2 (Right Elbow) 1. Cleansed with: Clean wound with Normal Saline 3. Peri-wound Care: Skin Prep 4. Dressing Applied: Aquacel Ag 5. Secondary Dressing Applied Bordered Foam Dressing Electronic Signature(s) Signed: 05/16/2015 2:09:42 PM By: Regan Lemming BSN, RN Entered By: Regan Lemming on 05/16/2015 14:09:42 Griffee, [place]. ([id number]) -------------------------------------------------------------------------------- Wound Assessment Details Patient Name: Chai, Damisha H. Date of Service: 05/16/2015 1:45 PM Medical Record Number: [id number] Patient Account Number: [id number] Date of Birth/Sex: Sep 29, 1945 (69 y.o. Female) Treating RN: Afful, RN, BSN, [place] Primary Care Physician: Paulita Cradle Other Clinician: Referring Physician: Paulita Cradle Treating Physician/Extender: Frann Rider in Treatment: 9 Wound Status Wound Number: 3 Primary Pressure Ulcer Etiology: Wound Location: Back - Midline Wound Open Wounding Event: Pressure Injury Status: Date Acquired: 05/02/2015 Comorbid Cataracts, Asthma, Hypertension, Type Weeks Of Treatment: 2 History: II Diabetes, Neuropathy, Received Clustered Wound: No Chemotherapy Photos Photo Uploaded By: Regan Lemming on 05/16/2015 16:12:28 Wound Measurements Length: (cm) 3 Width: (cm) 2 Depth: (cm) 0.1 Area: (cm) 4.712 Volume: (cm) 0.471 % Reduction in Area: -2041.8% % Reduction in Volume: -2040.9% Epithelialization: None Tunneling: No Undermining: No Wound Description Classification: Category/Stage II Wound Margin: Flat and Intact Exudate Amount: Medium Foul Odor After Cleansing: No Wound Bed Granulation Amount: None Present (0%) Exposed Structure Necrotic Amount: Large (67-100%) Fascia Exposed: No Necrotic Quality: Eschar Fat Layer Exposed: No Tendon Exposed: No Muscle Exposed: No Joint Exposed: No Lefevers, Onna H. ([id number]) Bone Exposed: No Limited to Skin Breakdown Periwound Skin Texture Texture Color No Abnormalities Noted: No No Abnormalities Noted: No Callus: No Atrophie Blanche: No Crepitus: No Cyanosis: No Excoriation: No Ecchymosis: No Fluctuance: No Erythema: No Friable: No Hemosiderin Staining: No Induration: No Mottled: No Localized Edema: No Pallor: No  Rash: No Rubor: No Scarring: No Temperature / Pain Moisture Temperature: No Abnormality No Abnormalities Noted: No Tenderness on Palpation: Yes Dry / Scaly: No Maceration: No Moist: Yes Wound Preparation Ulcer Cleansing: Rinsed/Irrigated with Saline Topical Anesthetic Applied: Other: lidocaine 4%, Treatment Notes Wound #3 (Midline Back) 1. Cleansed with: Clean wound with Normal Saline 3. Peri-wound Care: Skin Prep 4. Dressing Applied: Aquacel Ag 5. Secondary  Dressing Applied Bordered Foam Dressing Electronic Signature(s) Signed: 05/16/2015 2:10:37 PM By: Regan Lemming BSN, RN Entered By: Regan Lemming on 05/16/2015 14:10:37 Gearhart, [place]. ([id number]) -------------------------------------------------------------------------------- Wound Assessment Details Patient Name: Chirco, Jailine H. Date of Service: 05/16/2015 1:45 PM Medical Record Number: [id number] Patient Account Number: [id number] Date of Birth/Sex: 1946/09/03 (69 y.o. Female) Treating RN: Afful, RN, BSN, [place] Primary Care Physician: Paulita Cradle Other Clinician: Referring Physician: Paulita Cradle Treating Physician/Extender: Frann Rider in Treatment: 9 Wound Status Wound Number: 4 Primary Pressure Ulcer Etiology: Wound Location: Right Calcaneous Wound Open Wounding Event: Pressure Injury Status: Date Acquired: 05/11/2015 Comorbid Cataracts, Asthma, Hypertension, Type Weeks Of Treatment: 0 History: II Diabetes, Neuropathy, Received Clustered Wound: No Chemotherapy Photos Photo Uploaded By: Regan Lemming on 05/16/2015 16:12:28 Wound Measurements Length: (cm) 2.3 Width: (cm) 2 Depth: (cm) 0.1 Area: (cm) 3.613 Volume: (cm) 0.361 % Reduction in Area: 0% % Reduction in Volume: 0% Epithelialization: None Tunneling: No Undermining: No Wound Description Classification: Unstageable/Unclassified Diabetic Severity Unable to visualize wound Earleen [place]): bed Wound Margin: Distinct, outline attached Exudate Amount: None Present Wound Bed Granulation Amount: None Present (0%) Necrotic Amount: Large (67-100%) Necrotic Quality: Eschar Brummond, Sharyl H. ([id number]) Periwound Skin Texture Texture Color No Abnormalities Noted: No No Abnormalities Noted: No Callus: No Atrophie Blanche: No Crepitus: No Cyanosis: No Excoriation: No Ecchymosis: No Fluctuance: No Erythema: No Friable: No Hemosiderin Staining: No Induration: No Mottled: No Localized Edema:  No Pallor: No Rash: No Rubor: No Scarring: No Temperature / Pain Moisture Temperature: No Abnormality No Abnormalities Noted: No Tenderness on Palpation: Yes Dry / Scaly: Yes Maceration: No Moist: No Wound Preparation Ulcer Cleansing: Rinsed/Irrigated with Saline Topical Anesthetic Applied: Other: Lidocaine 4%, Treatment Notes Wound #4 (Right Calcaneous) 1. Cleansed with: Clean wound with Normal Saline 3. Peri-wound Care: Skin Prep 5. Secondary Dressing Applied Bordered Foam Dressing Electronic Signature(s) Signed: 05/16/2015 2:00:01 PM By: Regan Lemming BSN, RN Entered By: Regan Lemming on 05/16/2015 14:00:01 Smail, Okema H. ([id number]) -------------------------------------------------------------------------------- Vitals Details Patient Name: [name], Zyaire H. Date of Service: 05/16/2015 1:45 PM Medical Record Number: [id number] Patient Account Number: [id number] Date of Birth/Sex: 1945/10/06 (69 y.o. Female) Treating RN: Afful, RN, BSN, [place] Primary Care Physician: Paulita Cradle Other Clinician: Referring Physician: Paulita Cradle Treating Physician/Extender: Frann Rider in Treatment: 9 Vital Signs Time Taken: 13:52 Temperature (F): 98.6 Height (in): 65 Pulse (bpm): 115 Weight (lbs): 118 Respiratory Rate (breaths/min): 18 Body Mass Index (BMI): 19.6 Blood Pressure (mmHg): 117/70 Reference Range: 80 - 120 mg / dl Electronic Signature(s) Signed: 05/16/2015 1:53:21 PM By: Regan Lemming BSN, RN Entered By: Regan Lemming on 05/16/2015 13:53:21

## 2015-05-17 NOTE — Progress Notes (Signed)
YAKLIN, Deandre H. ([id number]) Visit Report for 05/16/2015 Chief Complaint Document Details Patient Name: [name], [name] Date of Service: 05/16/2015 1:45 PM Medical Record Patient Account Number: [id number] Number: Afful, RN, BSN, Treating RN: August 09, 1946 (68 y.o. Velva Harman Date of Birth/Sex: Female) Other Clinician: Primary Care Physician: Carrie Mew, MIRIAM Treating Christin Fudge Referring Physician: Paulita Cradle Physician/Extender: Suella Grove in Treatment: 9 Information Obtained from: Patient Chief Complaint Patient presents to the wound care center for a consult due non healing wound. Ulcers on the right elbow and the right heel for about 1 month. Electronic Signature(s) Signed: 05/16/2015 2:30:44 PM By: Christin Fudge MD, FACS Entered By: Christin Fudge on 05/16/2015 14:30:44 [place]endorf, Helayna H. ([id number]) -------------------------------------------------------------------------------- Debridement Details Patient Name: [name], Kijuana H. Date of Service: 05/16/2015 1:45 PM Medical Record Patient Account Number: [id number] Number: Afful, RN, BSN, Treating RN: [birth year] (69 y.o. Velva Harman Date of Birth/Sex: Female) Other Clinician: Primary Care Physician: Carrie Mew, MIRIAM Treating Darion Juhasz Referring Physician: Paulita Cradle Physician/Extender: Suella Grove in Treatment: 9 Debridement Performed for Wound #2 Right Elbow Assessment: Performed By: Physician Pat Patrick., MD Debridement: Open Wound/Selective Debridement Selective Description: Pre-procedure Yes Verification/Time Out Taken: Start Time: 14:20 Pain Control: Lidocaine 4% Topical Solution Level: Non-Viable Tissue Total Area Debrided (L x 1 (cm) x 0.5 (cm) = 0.5 (cm) W): Tissue and other Fibrin/Slough, Subcutaneous material debrided: Instrument: Forceps, Scissors Bleeding: None End Time: 14:21 Procedural Pain: 0 Post Procedural Pain: 0 Response to Treatment: Procedure was tolerated well Post  Debridement Measurements of Total Wound Length: (cm) 1 Stage: Category/Stage III Width: (cm) 0.5 Depth: (cm) 0.1 Volume: (cm) 0.039 Post Procedure Diagnosis Same as Pre-procedure Electronic Signature(s) Signed: 05/16/2015 2:31:09 PM By: Regan Lemming BSN, RN Signed: 05/16/2015 4:20:06 PM By: Christin Fudge MD, FACS Entered By: Regan Lemming on 05/16/2015 14:31:09 Kinlaw, Hanh H. ([id number]) Abair, Camille H. ([id number]) -------------------------------------------------------------------------------- HPI Details Patient Name: Hayner, Missie H. Date of Service: 05/16/2015 1:45 PM Medical Record Patient Account Number: [id number] Number: Afful, RN, BSN, Treating RN: [birth year] (69 y.o. Velva Harman Date of Birth/Sex: Female) Other Clinician: Primary Care Physician: Carrie Mew, MIRIAM Treating Christin Fudge Referring Physician: Paulita Cradle Physician/Extender: Weeks in Treatment: 9 History of Present Illness Location: Ulceration on the right heel and the right elbow. Quality: Patient reports experiencing a dull pain to affected area(s). Severity: Patient states wound (s) are getting better. Duration: Patient has had the wound for < 4 weeks prior to presenting for treatment Timing: Pain in wound is Intermittent (comes and goes Context: The wound appeared gradually over time Modifying Factors: Consults to this date include:Augmentin and Bactrim and also some heel protection with duoderm Associated Signs and Symptoms: Patient reports having difficulty standing for long periods. HPI Description: 69 year old female with history of peripheral neuropathy, history of diet controlled diabetes mellitus type 2, history of alcoholism here for wound consult sent by her PCP Dr. Sherilyn Cooter. She has pressure ulcers at her right elbow, bilateral heels. Plain films of right calcaneus without acute bony process. Patient started by PCP on Augmentin, Bactrim as per orders, DuoDerm dressings applied - reports  some improvement in her ulcer since last seen. Denies fever, chills, nausea, vomiting, diarrhea. She had a right humerus fracture in the middle of May and has had no surgery and arm is in a sling. She is also been laying in the bed for quite a while. Past medical history significant for essential hypertension, osteoporosis, peripheral neuropathy, alcoholism, ataxia, personal history of breast cancer treated with surgery chemotherapy and radiation and this was  done in December 2010. she is also status post laparoscopic cholecystectomy, pilonidal cyst excision, subcutaneous port placement, partial mastectomy on the left side, skin cancer removal. 03/21/2015 -- she says overall she's been doing better and continues to smoke about 15 cigarettes a day. 03/21/2015 - her orthopedic doctor has said she may require surgery for her right humerus fracture. 04/04/2015 -- her orthopedic surgery has been scheduled for August 11. 04/18/2015 -- she is doing fine as far as her elbow and her right heel goes but she has developed some redness over prominence on her thoracic spine and wanted me to take a look at this. 05/02/2015 -- she had her surgery done and now is in a sling and support. Her back has developed a pressure injury of undetermined stage. She seems to be in better spirits. 05/16/2015 -- last week her right heel was looking great and we had healed it out but she has not been offloading appropriately and has a deep tissue injury on the right heel again. The area on her right elbow has opened out with slough and the area in the thoracic spine is also getting worse. Reiger, Labrea H. ([id number]) Electronic Signature(s) Signed: 05/16/2015 2:32:15 PM By: Christin Fudge MD, FACS Entered By: Christin Fudge on 05/16/2015 14:32:15 Claytor, Alisah H. ([id number]) -------------------------------------------------------------------------------- Physical Exam Details Patient Name: Andrepont, Riot H. Date of Service:  05/16/2015 1:45 PM Medical Record Patient Account Number: [id number] Number: Afful, RN, BSN, Treating RN: 1945-12-27 (69 y.o. Velva Harman Date of Birth/Sex: Female) Other Clinician: Primary Care Physician: Carrie Mew, MIRIAM Treating Christin Fudge Referring Physician: Paulita Cradle Physician/Extender: Weeks in Treatment: 9 Constitutional . Pulse regular. Respirations normal and unlabored. Afebrile. . Eyes Nonicteric. Reactive to light. Ears, Nose, Mouth, and Throat Lips, teeth, and gums WNL.Marland Kitchen Moist mucosa without lesions . Neck supple and nontender. No palpable supraclavicular or cervical adenopathy. Normal sized without goiter. Respiratory WNL. No retractions.. Cardiovascular Pedal Pulses WNL. No clubbing, cyanosis or edema. Lymphatic No adneopathy. No adenopathy. No adenopathy. Musculoskeletal Adexa without tenderness or enlargement.. Digits and nails w/o clubbing, cyanosis, infection, petechiae, ischemia, or inflammatory conditions.. Integumentary (Hair, Skin) No suspicious lesions. No crepitus or fluctuance. No peri-wound warmth or erythema. No masses.Marland Kitchen Psychiatric Judgement and insight Intact.. No evidence of depression, anxiety, or agitation.. Notes the right heel now has a deep tissue injury and there is no open wound though. The right elbow has wet slough need sharp debridement with a forcep and scissors. The thoracic spine has a deep tissue injury with some slough covering it Electronic Signature(s) Signed: 05/16/2015 2:33:40 PM By: Christin Fudge MD, FACS Entered By: Christin Fudge on 05/16/2015 14:33:40 Farha, Jazzmine H. ([id number]) -------------------------------------------------------------------------------- Physician Orders Details Patient Name: Wimer, Korynn H. Date of Service: 05/16/2015 1:45 PM Medical Record Patient Account Number: [id number] Number: Afful, RN, BSN, Treating RN: [birth year] (69 y.o. Velva Harman Date of Birth/Sex: Female) Other  Clinician: Primary Care Physician: Carrie Mew, MIRIAM Treating Marwin Primmer Referring Physician: Paulita Cradle Physician/Extender: Suella Grove in Treatment: 9 Verbal / Phone Orders: Yes Clinician: Afful, RN, BSN, Rita Read Back and Verified: Yes Diagnosis Coding Wound Cleansing Wound #2 Right Elbow o Cleanse wound with mild soap and water o May Shower, gently pat wound dry prior to applying new dressing. Wound #3 Midline Back o Cleanse wound with mild soap and water o May Shower, gently pat wound dry prior to applying new dressing. Wound #4 Right Calcaneous o Cleanse wound with mild soap and water o May Shower, gently pat wound dry prior to applying  new dressing. Anesthetic Wound #2 Right Elbow o Topical Lidocaine 4% cream applied to wound bed prior to debridement Wound #3 Midline Back o Topical Lidocaine 4% cream applied to wound bed prior to debridement Wound #4 Right Calcaneous o Topical Lidocaine 4% cream applied to wound bed prior to debridement Skin Barriers/Peri-Wound Care Wound #2 Right Elbow o Skin Prep Wound #3 Midline Back o Skin Prep Wound #4 Right Calcaneous o Skin Prep Primary Wound Dressing Wound #2 Right Elbow Sparkman, Marinda H. ([id number]) o Aquacel Ag Wound #3 Midline Back o Aquacel Ag Wound #4 Right Calcaneous o Boardered Foam Dressing Secondary Dressing Wound #2 Right Elbow o Boardered Foam Dressing Wound #3 Midline Back o Boardered Foam Dressing Wound #4 Right Calcaneous o Boardered Foam Dressing Dressing Change Frequency Wound #2 Right Elbow o Change dressing every other day. Wound #3 Midline Back o Change dressing every other day. Wound #4 Right Calcaneous o Change dressing every other day. Follow-up Appointments Wound #2 Right Elbow o Return Appointment in 1 week. Wound #3 Midline Back o Return Appointment in 1 week. Wound #4 Right Calcaneous o Return Appointment in 1  week. Off-Loading Wound #2 Right Elbow o Heel suspension boot to: - Patient instructed on the importance of wearing her SAGE boots Wound #3 Midline Back o Heel suspension boot to: - Patient instructed on the importance of wearing her SAGE boots Wound #4 Right Calcaneous o Heel suspension boot to: - Patient instructed on the importance of wearing her SAGE boots Azzaro, Evaleigh H. ([id number]) [place] #2 Right Elbow o [place] Visits - [place] Nurse may visit PRN to address patientos wound care needs. o FACE TO FACE ENCOUNTER: MEDICARE and MEDICAID PATIENTS: I certify that this patient is under my care and that I had a face-to-face encounter that meets the physician face-to-face encounter requirements with this patient on this date. The encounter with the patient was in whole or in part for the following MEDICAL CONDITION: (primary reason for [place]) MEDICAL NECESSITY: I certify, that based on my findings, NURSING services are a medically necessary home health service. HOME BOUND STATUS: I certify that my clinical findings support that this patient is homebound (i.e., Due to illness or injury, pt requires aid of supportive devices such as crutches, cane, wheelchairs, walkers, the use of special transportation or the assistance of another person to leave their place of residence. There is a normal inability to leave the home and doing so requires considerable and taxing effort. Other absences are for medical reasons / religious services and are infrequent or of short duration when for other reasons). o If current dressing causes regression in wound condition, may D/C ordered dressing product/s and apply Normal Saline Moist Dressing daily until next [place] / Other MD appointment. [place] of regression in wound condition at [phone]. o Please direct any NON-WOUND related issues/requests for orders  to patient's Primary Care Physician Wound #3 Midline Back o [place] Visits - [place] Nurse may visit PRN to address patientos wound care needs. o FACE TO FACE ENCOUNTER: MEDICARE and MEDICAID PATIENTS: I certify that this patient is under my care and that I had a face-to-face encounter that meets the physician face-to-face encounter requirements with this patient on this date. The encounter with the patient was in whole or in part for the following MEDICAL CONDITION: (primary reason for [place]) MEDICAL NECESSITY: I certify, that based on my  findings, NURSING services are a medically necessary home health service. HOME BOUND STATUS: I certify that my clinical findings support that this patient is homebound (i.e., Due to illness or injury, pt requires aid of supportive devices such as crutches, cane, wheelchairs, walkers, the use of special transportation or the assistance of another person to leave their place of residence. There is a normal inability to leave the home and doing so requires considerable and taxing effort. Other absences are for medical reasons / religious services and are infrequent or of short duration when for other reasons). o If current dressing causes regression in wound condition, may D/C ordered dressing product/s and apply Normal Saline Moist Dressing daily until next [place] / Other MD appointment. [place] of regression in wound condition at [phone]. o Please direct any NON-WOUND related issues/requests for orders to patient's Primary Care Physician Wound #4 Right [place] Visits - [place] Nurse may visit PRN to address patientos wound care needs. o FACE TO FACE ENCOUNTER: MEDICARE and MEDICAID PATIENTS: I certify that this patient is under my care and that I had a face-to-face encounter that meets the physician  face-to-face encounter requirements with this patient on this date. The encounter with the patient was in whole or in part for the following MEDICAL CONDITION: (primary reason for [place]) [place], Kelsye H. ([id number]) MEDICAL NECESSITY: I certify, that based on my findings, NURSING services are a medically necessary home health service. HOME BOUND STATUS: I certify that my clinical findings support that this patient is homebound (i.e., Due to illness or injury, pt requires aid of supportive devices such as crutches, cane, wheelchairs, walkers, the use of special transportation or the assistance of another person to leave their place of residence. There is a normal inability to leave the home and doing so requires considerable and taxing effort. Other absences are for medical reasons / religious services and are infrequent or of short duration when for other reasons). o If current dressing causes regression in wound condition, may D/C ordered dressing product/s and apply Normal Saline Moist Dressing daily until next [place] / Other MD appointment. [place] of regression in wound condition at [phone]. o Please direct any NON-WOUND related issues/requests for orders to patient's Primary Care Physician Electronic Signature(s) Signed: 05/16/2015 2:29:36 PM By: Regan Lemming BSN, RN Signed: 05/16/2015 4:20:06 PM By: Christin Fudge MD, FACS Entered By: Regan Lemming on 05/16/2015 14:29:36 Nobbe, Nataliee H. ([id number]) -------------------------------------------------------------------------------- Problem List Details Patient Name: Wiebelhaus, Aziah H. Date of Service: 05/16/2015 1:45 PM Medical Record Patient Account Number: [id number] Number: Afful, RN, BSN, Treating RN: 1945-09-18 (69 y.o. Velva Harman Date of Birth/Sex: Female) Other Clinician: Primary Care Physician: Carrie Mew, MIRIAM Treating Corban Kistler Referring Physician: Paulita Cradle Physician/Extender: Suella Grove in Treatment: 9 Active Problems ICD-10 Encounter Code Description Active Date Diagnosis E11.621 Type 2 diabetes mellitus with foot ulcer 03/13/2015 Yes L89.613 Pressure ulcer of right heel, stage 3 03/13/2015 Yes L89.013 Pressure ulcer of right elbow, stage 3 03/13/2015 Yes F17.218 Nicotine dependence, cigarettes, with other nicotine- 03/13/2015 Yes induced disorders L89.100 Pressure ulcer of unspecified part of back, unstageable 05/02/2015 Yes Inactive Problems Resolved Problems Electronic Signature(s) Signed: 05/16/2015 2:30:36 PM By: Christin Fudge MD, FACS Entered By: Christin Fudge on 05/16/2015 14:30:36 Fambro, Cuba H. ([id number]) -------------------------------------------------------------------------------- Progress Note Details Patient Name: Palencia, Nyiesha H. Date of Service: 05/16/2015 1:45 PM Medical Record Patient Account Number: [id number] Number: Afful, RN,  BSN, Treating RN: [birth year] (69 y.o. Velva Harman Date of Birth/Sex: Female) Other Clinician: Primary Care Physician: Carrie Mew, MIRIAM Treating Christin Fudge Referring Physician: Paulita Cradle Physician/Extender: Suella Grove in Treatment: 9 Subjective Chief Complaint Information obtained from Patient Patient presents to the wound care center for a consult due non healing wound. Ulcers on the right elbow and the right heel for about 1 month. History of Present Illness (HPI) The following HPI elements were documented for the patient's wound: Location: Ulceration on the right heel and the right elbow. Quality: Patient reports experiencing a dull pain to affected area(s). Severity: Patient states wound (s) are getting better. Duration: Patient has had the wound for < 4 weeks prior to presenting for treatment Timing: Pain in wound is Intermittent (comes and goes Context: The wound appeared gradually over time Modifying Factors: Consults to this date include:Augmentin and Bactrim and  also some heel protection with duoderm Associated Signs and Symptoms: Patient reports having difficulty standing for long periods. 69 year old female with history of peripheral neuropathy, history of diet controlled diabetes mellitus type 2, history of alcoholism here for wound consult sent by her PCP Dr. Sherilyn Cooter. She has pressure ulcers at her right elbow, bilateral heels. Plain films of right calcaneus without acute bony process. Patient started by PCP on Augmentin, Bactrim as per orders, DuoDerm dressings applied - reports some improvement in her ulcer since last seen. Denies fever, chills, nausea, vomiting, diarrhea. She had a right humerus fracture in the middle of May and has had no surgery and arm is in a sling. She is also been laying in the bed for quite a while. Past medical history significant for essential hypertension, osteoporosis, peripheral neuropathy, alcoholism, ataxia, personal history of breast cancer treated with surgery chemotherapy and radiation and this was done in December 2010. she is also status post laparoscopic cholecystectomy, pilonidal cyst excision, subcutaneous port placement, partial mastectomy on the left side, skin cancer removal. 03/21/2015 -- she says overall she's been doing better and continues to smoke about 15 cigarettes a day. 03/21/2015 - her orthopedic doctor has said she may require surgery for her right humerus fracture. 04/04/2015 -- her orthopedic surgery has been scheduled for August 11. 04/18/2015 -- she is doing fine as far as her elbow and her right heel goes but she has developed some Hutcherson, Ashby H. ([id number]) redness over prominence on her thoracic spine and wanted me to take a look at this. 05/02/2015 -- she had her surgery done and now is in a sling and support. Her back has developed a pressure injury of undetermined stage. She seems to be in better spirits. 05/16/2015 -- last week her right heel was looking great and we had healed it  out but she has not been offloading appropriately and has a deep tissue injury on the right heel again. The area on her right elbow has opened out with slough and the area in the thoracic spine is also getting worse. Objective Constitutional Pulse regular. Respirations normal and unlabored. Afebrile. Vitals Time Taken: 1:52 PM, Height: 65 in, Weight: 118 lbs, BMI: 19.6, Temperature: 98.6 F, Pulse: 115 bpm, Respiratory Rate: 18 breaths/min, Blood Pressure: 117/70 mmHg. Eyes Nonicteric. Reactive to light. Ears, Nose, Mouth, and Throat Lips, teeth, and gums WNL.Marland Kitchen Moist mucosa without lesions . Neck supple and nontender. No palpable supraclavicular or cervical adenopathy. Normal sized without goiter. Respiratory WNL. No retractions.. Cardiovascular Pedal Pulses WNL. No clubbing, cyanosis or edema. Lymphatic No adneopathy. No adenopathy. No adenopathy. Musculoskeletal Adexa without tenderness or enlargement.Marland Kitchen  Digits and nails w/o clubbing, cyanosis, infection, petechiae, ischemia, or inflammatory conditions.Marland Kitchen Psychiatric Judgement and insight Intact.. No evidence of depression, anxiety, or agitation.. General Notes: the right heel now has a deep tissue injury and there is no open wound though. The right elbow has wet slough need sharp debridement with a forcep and scissors. The thoracic spine has a deep Flannery, Ciclaly H. ([id number]) tissue injury with some slough covering it Integumentary (Hair, Skin) No suspicious lesions. No crepitus or fluctuance. No peri-wound warmth or erythema. No masses.. Wound #2 status is Open. Original cause of wound was Gradually Appeared. The wound is located on the Right Elbow. The wound measures 1cm length x 0.5cm width x 0.1cm depth; 0.393cm^2 area and 0.039cm^3 volume. The wound is limited to skin breakdown. There is no tunneling or undermining noted. There is a small amount of serosanguineous drainage noted. The wound margin is distinct with the  outline attached to the wound base. There is no granulation within the wound bed. There is a large (67-100%) amount of necrotic tissue within the wound bed including Adherent Slough. The periwound skin appearance exhibited: Moist. The periwound skin appearance did not exhibit: Callus, Crepitus, Excoriation, Fluctuance, Friable, Induration, Localized Edema, Rash, Scarring, Dry/Scaly, Maceration, Atrophie Blanche, Cyanosis, Ecchymosis, Hemosiderin Staining, Mottled, Pallor, Rubor, Erythema. Periwound temperature was noted as No Abnormality. The periwound has tenderness on palpation. Wound #3 status is Open. Original cause of wound was Pressure Injury. The wound is located on the Midline Back. The wound measures 3cm length x 2cm width x 0.1cm depth; 4.712cm^2 area and 0.471cm^3 volume. The wound is limited to skin breakdown. There is no tunneling or undermining noted. There is a medium amount of drainage noted. The wound margin is flat and intact. There is no granulation within the wound bed. There is a large (67-100%) amount of necrotic tissue within the wound bed including Eschar. The periwound skin appearance exhibited: Moist. The periwound skin appearance did not exhibit: Callus, Crepitus, Excoriation, Fluctuance, Friable, Induration, Localized Edema, Rash, Scarring, Dry/Scaly, Maceration, Atrophie Blanche, Cyanosis, Ecchymosis, Hemosiderin Staining, Mottled, Pallor, Rubor, Erythema. Periwound temperature was noted as No Abnormality. The periwound has tenderness on palpation. Wound #4 status is Open. Original cause of wound was Pressure Injury. The wound is located on the Right Calcaneous. The wound measures 2.3cm length x 2cm width x 0.1cm depth; 3.613cm^2 area and 0.361cm^3 volume. There is no tunneling or undermining noted. There is a none present amount of drainage noted. The wound margin is distinct with the outline attached to the wound base. There is no granulation within the wound bed.  There is a large (67-100%) amount of necrotic tissue within the wound bed including Eschar. The periwound skin appearance exhibited: Dry/Scaly. The periwound skin appearance did not exhibit: Callus, Crepitus, Excoriation, Fluctuance, Friable, Induration, Localized Edema, Rash, Scarring, Maceration, Moist, Atrophie Blanche, Cyanosis, Ecchymosis, Hemosiderin Staining, Mottled, Pallor, Rubor, Erythema. Periwound temperature was noted as No Abnormality. The periwound has tenderness on palpation. Assessment Active Problems ICD-10 E11.621 - Type 2 diabetes mellitus with foot ulcer L89.613 - Pressure ulcer of right heel, stage 3 L89.013 - Pressure ulcer of right elbow, stage 3 F17.218 - Nicotine dependence, cigarettes, with other nicotine-induced disorders Ortego, Wilder H. ([id number]) L89.100 - Pressure ulcer of unspecified part of back, unstageable I have recommended silver alginate on the right elbow and the thoracic spine with bordered foam to protect and offload. Have also recommended a bordered foam on her right heel. I have spent some time discussing both the  patient and her husband who is the caregiver the Topam[place] that of off loading, good nutrition and vitamin supplements. I have also continued to motivate her to completely give up smoking. She says she is currently compliant and I will see her back next week. Procedures Wound #2 Wound #2 is a Pressure Ulcer located on the Right Elbow . There was a Non-Viable Tissue Open Wound/Selective [phone]) debridement with total area of 0.5 sq cm performed by Cookie Pore, Jackson Latino., MD. with the following instrument(s): Forceps and Scissors including Fibrin/Slough and Subcutaneous after achieving pain control using Lidocaine 4% Topical Solution. A time out was conducted prior to the start of the procedure. There was no bleeding. The procedure was tolerated well with a pain level of 0 throughout and a pain level of 0 following the procedure. Post  Debridement Measurements: 1cm length x 0.5cm width x 0.1cm depth; 0.039cm^3 volume. Post debridement Stage noted as Category/Stage III. Post procedure Diagnosis Wound #2: Same as Pre-Procedure Plan Wound Cleansing: Wound #2 Right Elbow: Cleanse wound with mild soap and water May Shower, gently pat wound dry prior to applying new dressing. Wound #3 Midline Back: Cleanse wound with mild soap and water May Shower, gently pat wound dry prior to applying new dressing. Wound #4 Right Calcaneous: Cleanse wound with mild soap and water May Shower, gently pat wound dry prior to applying new dressing. Anesthetic: Wound #2 Right Elbow: Topical Lidocaine 4% cream applied to wound bed prior to debridement Wound #3 Midline Back: Topical Lidocaine 4% cream applied to wound bed prior to debridement Wound #4 Right Calcaneous: Pha, Gabrille H. ([id number]) Topical Lidocaine 4% cream applied to wound bed prior to debridement Skin Barriers/Peri-Wound Care: Wound #2 Right Elbow: Skin Prep Wound #3 Midline Back: Skin Prep Wound #4 Right Calcaneous: Skin Prep Primary Wound Dressing: Wound #2 Right Elbow: Aquacel Ag Wound #3 Midline Back: Aquacel Ag Wound #4 Right Calcaneous: Boardered Foam Dressing Secondary Dressing: Wound #2 Right Elbow: Boardered Foam Dressing Wound #3 Midline Back: Boardered Foam Dressing Wound #4 Right Calcaneous: Boardered Foam Dressing Dressing Change Frequency: Wound #2 Right Elbow: Change dressing every other day. Wound #3 Midline Back: Change dressing every other day. Wound #4 Right Calcaneous: Change dressing every other day. Follow-up Appointments: Wound #2 Right Elbow: Return Appointment in 1 week. Wound #3 Midline Back: Return Appointment in 1 week. Wound #4 Right Calcaneous: Return Appointment in 1 week. Off-Loading: Wound #2 Right Elbow: Heel suspension boot to: - Patient instructed on the importance of wearing her SAGE boots Wound #3 Midline  Back: Heel suspension boot to: - Patient instructed on the importance of wearing her SAGE boots Wound #4 Right Calcaneous: Heel suspension boot to: - Patient instructed on the importance of wearing her SAGE [place]: Wound #2 Right Elbow: Continue Home Health Visits - [place] Nurse may visit PRN to address patient s wound care needs. FACE TO FACE ENCOUNTER: MEDICARE and MEDICAID PATIENTS: I certify that this patient is under my care and that I had a face-to-face encounter that meets the physician face-to-face encounter requirements with this patient on this date. The encounter with the patient was in whole or in part for the following MEDICAL CONDITION: (primary reason for [place]) MEDICAL NECESSITY: I certify, [place], Venna H. ([id number]) that based on my findings, NURSING services are a medically necessary home health service. HOME BOUND STATUS: I certify that my clinical findings support that this patient is homebound (i.e., Due to illness or injury, pt requires  aid of supportive devices such as crutches, cane, wheelchairs, walkers, the use of special transportation or the assistance of another person to leave their place of residence. There is a normal inability to leave the home and doing so requires considerable and taxing effort. Other absences are for medical reasons / religious services and are infrequent or of short duration when for other reasons). If current dressing causes regression in wound condition, may D/C ordered dressing product/s and apply Normal Saline Moist Dressing daily until next [place] / Other MD appointment. [place] of regression in wound condition at [phone]. Please direct any NON-WOUND related issues/requests for orders to patient's Primary Care Physician Wound #3 Midline Back: [place] Visits - [place] Nurse may visit PRN to address patient s wound care needs. FACE  TO FACE ENCOUNTER: MEDICARE and MEDICAID PATIENTS: I certify that this patient is under my care and that I had a face-to-face encounter that meets the physician face-to-face encounter requirements with this patient on this date. The encounter with the patient was in whole or in part for the following MEDICAL CONDITION: (primary reason for [place]) MEDICAL NECESSITY: I certify, that based on my findings, NURSING services are a medically necessary home health service. HOME BOUND STATUS: I certify that my clinical findings support that this patient is homebound (i.e., Due to illness or injury, pt requires aid of supportive devices such as crutches, cane, wheelchairs, walkers, the use of special transportation or the assistance of another person to leave their place of residence. There is a normal inability to leave the home and doing so requires considerable and taxing effort. Other absences are for medical reasons / religious services and are infrequent or of short duration when for other reasons). If current dressing causes regression in wound condition, may D/C ordered dressing product/s and apply Normal Saline Moist Dressing daily until next [place] / Other MD appointment. [place] of regression in wound condition at [phone]. Please direct any NON-WOUND related issues/requests for orders to patient's Primary Care Physician Wound #4 Right Calcaneous: [place] Visits - [place] Nurse may visit PRN to address patient s wound care needs. FACE TO FACE ENCOUNTER: MEDICARE and MEDICAID PATIENTS: I certify that this patient is under my care and that I had a face-to-face encounter that meets the physician face-to-face encounter requirements with this patient on this date. The encounter with the patient was in whole or in part for the following MEDICAL CONDITION: (primary reason for [place]) MEDICAL NECESSITY: I certify, that  based on my findings, NURSING services are a medically necessary home health service. HOME BOUND STATUS: I certify that my clinical findings support that this patient is homebound (i.e., Due to illness or injury, pt requires aid of supportive devices such as crutches, cane, wheelchairs, walkers, the use of special transportation or the assistance of another person to leave their place of residence. There is a normal inability to leave the home and doing so requires considerable and taxing effort. Other absences are for medical reasons / religious services and are infrequent or of short duration when for other reasons). If current dressing causes regression in wound condition, may D/C ordered dressing product/s and apply Normal Saline Moist Dressing daily until next [place] / Other MD appointment. [place] of regression in wound condition at [phone]. Please direct any NON-WOUND related issues/requests for orders to patient's Primary Care Physician [place],  Akshitha H. ([id number]) I have recommended silver alginate on the right elbow and the thoracic spine with bordered foam to protect and offload. Have also recommended a bordered foam on her right heel. I have spent some time discussing both the patient and her husband who is the caregiver the [place] that of off loading, good nutrition and vitamin supplements. I have also continued to motivate her to completely give up smoking. She says she is currently compliant and I will see her back next week. Electronic Signature(s) Signed: 05/16/2015 2:35:18 PM By: Christin Fudge MD, FACS Entered By: Christin Fudge on 05/16/2015 14:35:18 [place], Aruna H. ([id number]) -------------------------------------------------------------------------------- SuperBill Details Patient Name: [name], Kaidence H. Date of Service: 05/16/2015 Medical Record Patient Account Number: [id number] Number: Afful, RN, BSN, Treating RN: 1945-11-21  (69 y.o. 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, stage 3 F17.218 Nicotine dependence, cigarettes, with other nicotine-induced disorders L89.100 Pressure ulcer of unspecified part of back, unstageable Facility Procedures CPT4 Code: [id number] Description: [phone] - [place] WOUND 1ST 20 SQ CM OR < ICD-10 Description Diagnosis 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, 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

## 2015-05-23 ENCOUNTER — Encounter: Payer: Medicare Other | Admitting: Surgery

## 2015-05-23 DIAGNOSIS — L89613 Pressure ulcer of right heel, stage 3: Secondary | ICD-10-CM | POA: Diagnosis not present

## 2015-05-24 NOTE — Progress Notes (Signed)
CEDENO, Nishka H. ([id number]) Visit Report for 05/23/2015 Arrival Information Details Patient Name: [name], [name] Date of Service: 05/23/2015 1:45 PM Medical Record Number: [id number] Patient Account Number: [id number] Date of Birth/Sex: March 08, 1946 (69 y.o. Female) Treating RN: Afful, RN, BSN, Velva Harman Primary Care Physician: Paulita Cradle Other Clinician: Referring Physician: Paulita Cradle Treating Physician/Extender: Frann Rider in Treatment: 10 Visit Information History Since Last Visit Added or deleted any medications: No Patient Arrived: Wheel Chair Any new allergies or adverse reactions: No Arrival Time: 13:50 Had a fall or experienced change in No activities of daily living that may affect Accompanied By: spouse risk of falls: Transfer Assistance: None Signs or symptoms of abuse/neglect since last No Patient Identification Verified: Yes visito Secondary Verification Process Yes Has Dressing in Place as Prescribed: Yes Completed: Pain Present Now: No Patient Requires Transmission-Based No Precautions: Patient Has Alerts: No Electronic Signature(s) Signed: 05/23/2015 1:50:45 PM By: Regan Lemming BSN, RN Entered By: Regan Lemming on 05/23/2015 13:50:44 [name], [name] ([id number]) -------------------------------------------------------------------------------- Encounter Discharge Information Details Patient Name: [name], [name] Date of Service: 05/23/2015 1:45 PM Medical Record Number: [id number] Patient Account Number: [id number] Date of Birth/Sex: [birth year] (69 y.o. Female) Treating RN: Afful, RN, BSN, Velva Harman Primary Care Physician: Paulita Cradle Other Clinician: Referring Physician: Paulita Cradle Treating Physician/Extender: Frann Rider in Treatment: 10 Encounter Discharge Information Items Discharge Pain Level: 0 Discharge Condition: Stable Ambulatory Status: Wheelchair Discharge Destination: Home Transportation: Private Auto Accompanied  By: spouse Schedule Follow-up Appointment: No Medication Reconciliation completed No and provided to Patient/Care Kiana Hollar: Provided on Clinical Summary of Care: 05/23/2015 Form Type Recipient Paper Patient AS Electronic Signature(s) Signed: 05/23/2015 2:36:44 PM By: Ruthine Dose Previous Signature: 05/23/2015 2:19:41 PM Version By: Regan Lemming BSN, RN Entered By: Ruthine Dose on 05/23/2015 14:36:44 Shrieves, Deanne H. ([id number]) -------------------------------------------------------------------------------- Lower Extremity Assessment Details Patient Name: Vandenbrink, Antoninette H. Date of Service: 05/23/2015 1:45 PM Medical Record Number: [id number] Patient Account Number: [id number] Date of Birth/Sex: Jan 10, 1946 (69 y.o. Female) Treating RN: Afful, RN, BSN, Velva Harman Primary Care Physician: Paulita Cradle Other Clinician: Referring Physician: Paulita Cradle Treating Physician/Extender: Frann Rider in Treatment: 10 Vascular Assessment Pulses: Posterior Tibial Dorsalis Pedis Palpable: [Right:Yes] Extremity colors, hair growth, and conditions: Extremity Color: [Right:Normal] Hair Growth on Extremity: [Right:No] Temperature of Extremity: [Right:Warm] Capillary Refill: [Right:< 3 seconds] Toe Nail Assessment Left: Right: Thick: No Discolored: No Deformed: No Improper Length and Hygiene: No Electronic Signature(s) Signed: 05/23/2015 1:53:37 PM By: Regan Lemming BSN, RN Entered By: Regan Lemming on 05/23/2015 13:53:37 [name], [name] ([id number]) -------------------------------------------------------------------------------- Multi Wound Chart Details Patient Name: [name], Ceci H. Date of Service: 05/23/2015 1:45 PM Medical Record Number: [id number] Patient Account Number: [id number] Date of Birth/Sex: 12/25/1945 (69 y.o. 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] [3:Pressure Ulcer] [4:Pressure Ulcer] Comorbid History: [2:Cataracts, Asthma, Hypertension, Type II Diabetes, Neuropathy, Received Chemotherapy] [3:Cataracts, Asthma, Hypertension, Type II Diabetes, Neuropathy, Received Chemotherapy] [4:Cataracts, Asthma, Hypertension, Type II Diabetes,  Neuropathy, Received Chemotherapy] Date Acquired: [2:02/25/2015] [3:05/02/2015] [4:05/11/2015] Weeks of Treatment: [2:10] [3:3] [4:1] Wound Status: [2:Open] [3:Open] [4:Open] Measurements L x W x D 0.8x0.5x0.1 [3:3x2x0.1] [  4:2x2.3x0.1] (cm) Area (cm) : [2:0.314] [3:4.712] [4:3.613] Volume (cm) : [2:0.031] [3:0.471] [4:0.361] % Reduction in Area: [2:83.30%] [3:-2041.80%] [4:0.00%] % Reduction in Volume: 91.80% [3:-2040.90%] [4:0.00%] Classification: [2:Category/Stage III] [3:Category/Stage II] [4:Unstageable/Unclassified] HBO Classification: [2:N/A] [3:N/A] [4:Grade 1] Exudate Amount: [2:Small] [3:Medium] [4:Medium] Exudate Type: [2:Serosanguineous] [3:N/A] [4:Serosanguineous] Exudate Color: [2:red, brown] [3:N/A] [4:red, brown] Wound Margin: [2:Distinct, outline attached] [3:Flat and Intact] [4:Distinct, outline attached] Granulation Amount: [2:None Present (0%)] [3:None Present (0%)] [4:Medium (34-66%)] Necrotic Amount: [2:Large (67-100%)] [3:Large (67-100%)] [4:Medium (34-66%)] Necrotic Tissue: [2:Adherent Slough] [3:Eschar] [4:Adherent Slough] Exposed Structures: [2:Fascia: No Fat: No Tendon: No Muscle: No] [3:Fascia: No Fat: No  Tendon: No Muscle: No] [4:Fascia: No Fat: No Tendon: No Muscle: No] Joint: No Joint: No Joint: No Bone: No Bone: No Bone: No Limited to Skin Limited to Skin Limited to Skin Breakdown Breakdown Breakdown Epithelialization: Small (1-33%) None None Debridement: Open Wound/Selective N/A N/A ([id number]) - Selective Time-Out Taken: Yes N/A N/A Pain Control: Lidocaine 4% Topical N/A N/A Solution Tissue Debrided: Fibrin/Slough, N/A N/A Subcutaneous Level: Non-Viable Tissue N/A N/A Debridement Area (sq 4 N/A N/A cm): Instrument: Forceps, Scissors N/A N/A Bleeding: None N/A N/A Procedural Pain: 0 N/A N/A Post Procedural Pain: 0 N/A N/A Debridement Treatment Procedure was tolerated N/A N/A Response: well Post Debridement 0.8x0.5x0.1 N/A N/A Measurements L x W x D (cm) Post Debridement 0.031 N/A N/A Volume: (cm) Post Debridement Category/Stage III N/A N/A Stage: Periwound Skin Texture: Edema: No Edema: No Edema: No Excoriation: No Excoriation: No Excoriation: No Induration: No Induration: No Induration: No Callus: No Callus: No Callus: No Crepitus: No Crepitus: No Crepitus: No Fluctuance: No Fluctuance: No Fluctuance: No Friable: No Friable: No Friable: No Rash: No Rash: No Rash: No Scarring: No Scarring: No Scarring: No Periwound Skin Moist: Yes Moist: Yes Moist: Yes Moisture: Maceration: No Maceration: No Maceration: No Dry/Scaly: No Dry/Scaly: No Dry/Scaly: No Periwound Skin Color: Atrophie Blanche: No Atrophie Blanche: No Atrophie Blanche: No Cyanosis: No Cyanosis: No Cyanosis: No Ecchymosis: No Ecchymosis: No Ecchymosis: No Erythema: No Erythema: No Erythema: No Hemosiderin Staining: No Hemosiderin Staining: No Hemosiderin Staining: No Mottled: No Mottled: No Mottled: No Pallor: No Pallor: No Pallor: No Rubor: No Rubor: No Rubor: No Temperature: No Abnormality No Abnormality No Abnormality Yes Yes Yes Lysne, Irvin H.  ([id number]) Tenderness on Palpation: Wound Preparation: Ulcer Cleansing: Ulcer Cleansing: Ulcer Cleansing: Rinsed/Irrigated with Rinsed/Irrigated with Rinsed/Irrigated with Saline Saline Saline Topical Anesthetic Topical Anesthetic Topical Anesthetic Applied: Other: lidocaine Applied: Other: lidocaine Applied: Other: Lidocaine 4% 4% 4% Procedures Performed: Debridement N/A N/A Treatment Notes Electronic Signature(s) Signed: 05/23/2015 2:16:36 PM By: Regan Lemming BSN, RN Entered By: Regan Lemming on 05/23/2015 14:16:36 [name], [name] ([id number]) -------------------------------------------------------------------------------- Multi-Disciplinary Care Plan Details Patient Name: [name], Ayano H. Date of Service: 05/23/2015 1:45 PM Medical Record Number: [id number] Patient Account Number: [id number] Date of Birth/Sex: Dec 31, 1945 (69 y.o. Female) Treating RN: Afful, RN, BSN, Velva Harman Primary Care Physician: Paulita Cradle Other Clinician: Referring Physician: Paulita Cradle Treating Physician/Extender: Frann Rider in Treatment: 10 Active Inactive Abuse / Safety / Falls / Self Care Management Nursing Diagnoses: Impaired home maintenance Impaired physical mobility Knowledge deficit related to: safety; personal, health (wound), emergency Potential for falls Self care deficit: actual or potential Goals: Patient will remain injury free Date Initiated: 03/13/2015 Goal Status: Active Patient/caregiver will verbalize understanding of skin care regimen Date Initiated: 03/13/2015 Goal Status: Active Patient/caregiver will verbalize/demonstrate measure taken to improve self care Date Initiated: 03/13/2015 Goal Status: Active Patient/caregiver will verbalize/demonstrate measures taken to  improve the patient's personal safety Date Initiated: 03/13/2015 Goal Status: Active Patient/caregiver will verbalize/demonstrate measures taken to prevent injury and/or falls Date Initiated:  03/13/2015 Goal Status: Active Patient/caregiver will verbalize/demonstrate understanding of what to do in case of emergency Date Initiated: 03/13/2015 Goal Status: Active Interventions: Assess fall risk on admission and as needed Assess: immobility, friction, shearing, incontinence upon admission and as needed Assess impairment of mobility on admission and as needed per policy Assess self care needs on admission and as needed Provide education on basic hygiene [name], [name] ([id number]) Provide education on fall prevention Provide education on personal and home safety Provide education on safe transfers Treatment Activities: Education provided on Basic Hygiene : 03/13/2015 Notes: Orientation to the Wound Care Program Nursing Diagnoses: Knowledge deficit related to the wound healing center program Goals: Patient/caregiver will verbalize understanding of the [facility] Date Initiated: 03/13/2015 Goal Status: Active Interventions: Provide education on orientation to the wound center Notes: Pressure Nursing Diagnoses: Knowledge deficit related to causes and risk factors for pressure ulcer development Knowledge deficit related to management of pressures ulcers Potential for impaired tissue integrity related to pressure, friction, moisture, and shear Goals: Patient will remain free from development of additional pressure ulcers Date Initiated: 03/13/2015 Goal Status: Active Patient will remain free of pressure ulcers Date Initiated: 03/13/2015 Goal Status: Active Patient/caregiver will verbalize risk factors for pressure ulcer development Date Initiated: 03/13/2015 Goal Status: Active Patient/caregiver will verbalize understanding of pressure ulcer management Date Initiated: 03/13/2015 Goal Status: Active Interventions: Assess: immobility, friction, shearing, incontinence upon admission and as needed [name], [name] ([id number]) Assess offloading mechanisms upon  admission and as needed Assess potential for pressure ulcer upon admission and as needed Provide education on pressure ulcers Treatment Activities: Patient referred for home evaluation of offloading devices/mattresses : 05/23/2015 Patient referred for pressure reduction/relief devices : 05/23/2015 Patient referred for seating evaluation to ensure proper offloading : 05/23/2015 Pressure reduction/relief device ordered : 05/23/2015 Test ordered outside of clinic : 05/23/2015 Notes: Wound/Skin Impairment Nursing Diagnoses: Impaired tissue integrity Knowledge deficit related to ulceration/compromised skin integrity Goals: Patient/caregiver will verbalize understanding of skin care regimen Date Initiated: 03/13/2015 Goal Status: Active Ulcer/skin breakdown will have a volume reduction of 30% by week 4 Date Initiated: 03/13/2015 Goal Status: Active Ulcer/skin breakdown will have a volume reduction of 50% by week 8 Date Initiated: 03/13/2015 Goal Status: Active Ulcer/skin breakdown will have a volume reduction of 80% by week 12 Date Initiated: 03/13/2015 Goal Status: Active Ulcer/skin breakdown will heal within 14 weeks Date Initiated: 03/13/2015 Goal Status: Active Interventions: Assess patient/caregiver ability to obtain necessary supplies Assess patient/caregiver ability to perform ulcer/skin care regimen upon admission and as needed Assess ulceration(s) every visit Provide education on smoking Provide education on ulcer and skin care Treatment Activities: Patient referred to home care : 05/23/2015 [name], [name] ([id number]) Referred to DME Masae Lukacs for dressing supplies : 05/23/2015 Skin care regimen initiated : 05/23/2015 Topical wound management initiated : 05/23/2015 Notes: Electronic Signature(s) Signed: 05/23/2015 2:16:28 PM By: Regan Lemming BSN, RN Entered By: Regan Lemming on 05/23/2015 14:16:28 [name], [name]  ([id number]) -------------------------------------------------------------------------------- Pain Assessment Details Patient Name: [name], Keshia H. Date of Service: 05/23/2015 1:45 PM Medical Record Number: [id number] Patient Account Number: [id number] Date of Birth/Sex: 28-Aug-1946 (69 y.o. Female) Treating RN: Baruch Gouty, RN, BSN, Velva Harman Primary Care Physician: Paulita Cradle Other Clinician: Referring Physician: Paulita Cradle Treating Physician/Extender: Frann Rider in Treatment: 10 Active Problems Location of Pain Severity and Description of Pain Patient Has  Paino No Site Locations Pain Management and Medication Current Pain Management: Electronic Signature(s) Signed: 05/23/2015 1:50:56 PM By: Regan Lemming BSN, RN Entered By: Regan Lemming on 05/23/2015 13:50:56 [name], [name] ([id number]) -------------------------------------------------------------------------------- Patient/Caregiver Education Details Patient Name: [name], Seraya H. Date of Service: 05/23/2015 1:45 PM Medical Record Number: [id number] Patient Account Number: [id number] Date of Birth/Gender: October 17, 1945 (69 y.o. Female) Treating RN: Afful, RN, BSN, Velva Harman Primary Care Physician: Paulita Cradle Other Clinician: Referring Physician: Paulita Cradle Treating Physician/Extender: Frann Rider in Treatment: 10 Education Assessment Education Provided To: Patient Education Topics Provided Basic Hygiene: Methods: Explain/Verbal Responses: State content correctly Pressure: Methods: Explain/Verbal Responses: State content correctly Safety: Methods: Explain/Verbal Responses: State content correctly Smoking and Wound Healing: Methods: Explain/Verbal Responses: State content correctly Welcome [facility]: Methods: Explain/Verbal Responses: State content correctly Wound/Skin Impairment: Methods: Explain/Verbal Responses: State content correctly Electronic Signature(s) Signed: 05/23/2015  2:20:06 PM By: Regan Lemming BSN, RN Entered By: Regan Lemming on 05/23/2015 14:20:05 Allbritton, Danaysia H. ([id number]) -------------------------------------------------------------------------------- Wound Assessment Details Patient Name: [name], [name] Date of Service: 05/23/2015 1:45 PM Medical Record Number: [id number] Patient Account Number: [id number] Date of Birth/Sex: August 26, 1946 (69 y.o. Female) Treating RN: Afful, RN, BSN, [place] Primary Care Physician: Paulita Cradle Other Clinician: Referring Physician: Paulita Cradle Treating Physician/Extender: Frann Rider in Treatment: 10 Wound Status Wound Number: 2 Primary Pressure Ulcer Etiology: Wound Location: Right Elbow Wound Open Wounding Event: Gradually Appeared Status: Date Acquired: 02/25/2015 Comorbid Cataracts, Asthma, Hypertension, Type Weeks Of Treatment: 10 History: II Diabetes, Neuropathy, Received Clustered Wound: No Chemotherapy Photos Photo Uploaded By: Regan Lemming on 05/23/2015 15:55:17 Wound Measurements Length: (cm) 0.8 Width: (cm) 0.5 Depth: (cm) 0.1 Area: (cm) 0.314 Volume: (cm) 0.031 % Reduction in Area: 83.3% % Reduction in Volume: 91.8% Epithelialization: Small (1-33%) Tunneling: No Undermining: No Wound Description Classification: Category/Stage III Wound Margin: Distinct, outline attached Exudate Amount: Small Exudate Type: Serosanguineous Exudate Color: red, brown Foul Odor After Cleansing: No Wound Bed Granulation Amount: None Present (0%) Exposed Structure Necrotic Amount: Large (67-100%) Fascia Exposed: No Necrotic Quality: Adherent Slough Fat Layer Exposed: No Tendon Exposed: No Laker, Earnest H. ([id number]) Muscle Exposed: No Joint Exposed: No Bone Exposed: No Limited to Skin Breakdown Periwound Skin Texture Texture Color No Abnormalities Noted: No No Abnormalities Noted: No Callus: No Atrophie Blanche: No Crepitus: No Cyanosis: No Excoriation: No Ecchymosis:  No Fluctuance: No Erythema: No Friable: No Hemosiderin Staining: No Induration: No Mottled: No Localized Edema: No Pallor: No Rash: No Rubor: No Scarring: No Temperature / Pain Moisture Temperature: No Abnormality No Abnormalities Noted: No Tenderness on Palpation: Yes Dry / Scaly: No Maceration: No Moist: Yes Wound Preparation Ulcer Cleansing: Rinsed/Irrigated with Saline Topical Anesthetic Applied: Other: lidocaine 4%, Treatment Notes Wound #2 (Right Elbow) 1. Cleansed with: Clean wound with Normal Saline 4. Dressing Applied: Santyl Ointment 5. Secondary Dressing Applied Bordered Foam Dressing Electronic Signature(s) Signed: 05/23/2015 2:07:26 PM By: Regan Lemming BSN, RN Entered By: Regan Lemming on 05/23/2015 14:07:26 [name], [name] ([id number]) -------------------------------------------------------------------------------- Wound Assessment Details Patient Name: [place]ne, Chyrl H. Date of Service: 05/23/2015 1:45 PM Medical Record Number: [id number] Patient Account Number: [id number] Date of Birth/Sex: [birth year] (69 y.o. Female) Treating RN: Baruch Gouty, RN, BSN, Velva Harman Primary Care Physician: Paulita Cradle Other Clinician: Referring Physician: Paulita Cradle Treating Physician/Extender: Frann Rider in Treatment: 10 Wound Status Wound Number: 3 Primary Pressure Ulcer Etiology: Wound Location: Back - Midline Wound Open Wounding Event: Pressure Injury Status: Date Acquired: 05/02/2015 Comorbid Cataracts, Asthma, Hypertension, Type Weeks  Of Treatment: 3 History: II Diabetes, Neuropathy, Received Clustered Wound: No Chemotherapy Photos Photo Uploaded By: Regan Lemming on 05/23/2015 15:54:01 Wound Measurements Length: (cm) 3 Width: (cm) 2 Depth: (cm) 0.1 Area: (cm) 4.712 Volume: (cm) 0.471 % Reduction in Area: -2041.8% % Reduction in Volume: -2040.9% Epithelialization: None Undermining: No Wound Description Classification: Category/Stage II Wound  Margin: Flat and Intact Exudate Amount: Medium Foul Odor After Cleansing: No Wound Bed Granulation Amount: None Present (0%) Exposed Structure Necrotic Amount: Large (67-100%) Fascia Exposed: No Necrotic Quality: Eschar Fat Layer Exposed: No Tendon Exposed: No Muscle Exposed: No Joint Exposed: No Gresham, Mekesha H. ([id number]) Bone Exposed: No Limited to Skin Breakdown Periwound Skin Texture Texture Color No Abnormalities Noted: No No Abnormalities Noted: No Callus: No Atrophie Blanche: No Crepitus: No Cyanosis: No Excoriation: No Ecchymosis: No Fluctuance: No Erythema: No Friable: No Hemosiderin Staining: No Induration: No Mottled: No Localized Edema: No Pallor: No Rash: No Rubor: No Scarring: No Temperature / Pain Moisture Temperature: No Abnormality No Abnormalities Noted: No Tenderness on Palpation: Yes Dry / Scaly: No Maceration: No Moist: Yes Wound Preparation Ulcer Cleansing: Rinsed/Irrigated with Saline Topical Anesthetic Applied: Other: lidocaine 4%, Treatment Notes Wound #3 (Midline Back) 1. Cleansed with: Clean wound with Normal Saline 4. Dressing Applied: Santyl Ointment 5. Secondary Dressing Applied Bordered Foam Dressing Electronic Signature(s) Signed: 05/23/2015 2:07:41 PM By: Regan Lemming BSN, RN Entered By: Regan Lemming on 05/23/2015 14:07:41 [place], [place]. ([id number]) -------------------------------------------------------------------------------- Wound Assessment Details Patient Name: [name], Breeanna H. Date of Service: 05/23/2015 1:45 PM Medical Record Number: [id number] Patient Account Number: [id number] Date of Birth/Sex: 1946/09/01 (69 y.o. Female) Treating RN: Afful, RN, BSN, [place] Primary Care Physician: Paulita Cradle Other Clinician: Referring Physician: Paulita Cradle Treating Physician/Extender: Frann Rider in Treatment: 10 Wound Status Wound Number: 4 Primary Pressure Ulcer Etiology: Wound Location: Right  Calcaneous Wound Open Wounding Event: Pressure Injury Status: Date Acquired: 05/11/2015 Comorbid Cataracts, Asthma, Hypertension, Type Weeks Of Treatment: 1 History: II Diabetes, Neuropathy, Received Clustered Wound: No Chemotherapy Photos Photo Uploaded By: Regan Lemming on 05/23/2015 15:54:02 Wound Measurements Length: (cm) 2 Width: (cm) 2.3 Depth: (cm) 0.1 Area: (cm) 3.613 Volume: (cm) 0.361 % Reduction in Area: 0% % Reduction in Volume: 0% Epithelialization: None Tunneling: No Wound Description Classification: Unstageable/Unclassified Foul Diabetic Severity (Wagner): Grade 1 Wound Margin: Distinct, outline attached Exudate Amount: Medium Exudate Type: Serosanguineous Exudate Color: red, brown Odor After Cleansing: No Wound Bed Granulation Amount: Medium (34-66%) Exposed Structure Necrotic Amount: Medium (34-66%) Fascia Exposed: No Necrotic Quality: Adherent Slough Fat Layer Exposed: No Burgner, Bailyn H. ([id number]) Tendon Exposed: No Muscle Exposed: No Joint Exposed: No Bone Exposed: No Limited to Skin Breakdown Periwound Skin Texture Texture Color No Abnormalities Noted: No No Abnormalities Noted: No Callus: No Atrophie Blanche: No Crepitus: No Cyanosis: No Excoriation: No Ecchymosis: No Fluctuance: No Erythema: No Friable: No Hemosiderin Staining: No Induration: No Mottled: No Localized Edema: No Pallor: No Rash: No Rubor: No Scarring: No Temperature / Pain Moisture Temperature: No Abnormality No Abnormalities Noted: No Tenderness on Palpation: Yes Dry / Scaly: No Maceration: No Moist: Yes Wound Preparation Ulcer Cleansing: Rinsed/Irrigated with Saline Topical Anesthetic Applied: Other: Lidocaine 4%, Treatment Notes Wound #4 (Right Calcaneous) 1. Cleansed with: Clean wound with Normal Saline 4. Dressing Applied: Aquacel Ag 5. Secondary Dressing Applied Bordered Foam Dressing Electronic Signature(s) Signed: 05/23/2015 2:08:40 PM By:  Regan Lemming BSN, RN Entered By: Regan Lemming on 05/23/2015 14:08:40 Dwyer, Amely H. ([id number]) -------------------------------------------------------------------------------- Vitals Details Patient Name: [name], Julieta H.  Date of Service: 05/23/2015 1:45 PM Medical Record Number: [id number] Patient Account Number: [id number] Date of Birth/Sex: Oct 03, 1945 (69 y.o. Female) Treating RN: Afful, RN, BSN, [place] Primary Care Physician: Paulita Cradle Other Clinician: Referring Physician: Paulita Cradle Treating Physician/Extender: Frann Rider in Treatment: 10 Vital Signs Time Taken: 13:50 Temperature (F): 98.4 Height (in): 65 Pulse (bpm): 106 Weight (lbs): 118 Respiratory Rate (breaths/min): 16 Body Mass Index (BMI): 19.6 Blood Pressure (mmHg): 93/61 Reference Range: 80 - 120 mg / dl Electronic Signature(s) Signed: 05/23/2015 1:52:59 PM By: Regan Lemming BSN, RN Entered By: Regan Lemming on 05/23/2015 13:52:59

## 2015-05-24 NOTE — Progress Notes (Signed)
[name] ([id number]) Visit Report for 05/23/2015 Chief Complaint Document Details Patient Name: [name] Date of Service: 05/23/2015 1:45 PM Medical Record Number: [id number] Patient Account Number: [id number] Date of Birth/Sex: Feb 05, 1946 (69 y.o. Female) Treating RN: Cornell Barman Primary Care Physician: Paulita Cradle Other Clinician: Referring Physician: Paulita Cradle Treating Physician/Extender: Frann Rider in Treatment: 10 Information Obtained from: Patient Chief Complaint Patient presents to the wound care center for a consult due non healing wound. Ulcers on the right elbow and the right heel for about 1 month. Electronic Signature(s) Signed: 05/23/2015 2:26:35 PM By: Christin Fudge MD, FACS Entered By: Christin Fudge on 05/23/2015 14:26:35 [name] ([id number]) -------------------------------------------------------------------------------- Debridement Details Patient Name: [name] Date of Service: 05/23/2015 1:45 PM Medical Record Number: [id number] Patient Account Number: [id number] Date of Birth/Sex: 04/20/1946 (69 y.o. Female) Treating RN: Cornell Barman Primary Care Physician: Paulita Cradle Other Clinician: Referring Physician: Paulita Cradle Treating Physician/Extender: Frann Rider in Treatment: 10 Debridement Performed for Wound #2 Right Elbow Assessment: Performed By: Physician Pat Patrick., MD Debridement: Open Wound/Selective Debridement Selective Description: Pre-procedure Yes Verification/Time Out Taken: Start Time: 14:13 Pain Control: Lidocaine 4% Topical Solution Level: Non-Viable Tissue Total Area Debrided (L x 0.8 (cm) x 5 (cm) = 4 (cm) W): Tissue and other Non-Viable, Eschar, Exudate, Fibrin/Slough material debrided: Instrument: Forceps, Scissors Bleeding: None End Time: 14:15 Procedural Pain: 0 Post Procedural Pain: 0 Response to Treatment: Procedure was tolerated well Post Debridement  Measurements of Total Wound Length: (cm) 0.8 Stage: Category/Stage III Width: (cm) 0.5 Depth: (cm) 0.1 Volume: (cm) 0.031 Post Procedure Diagnosis Same as Pre-procedure Electronic Signature(s) Signed: 05/23/2015 2:26:29 PM By: Christin Fudge MD, FACS Signed: 05/23/2015 5:06:19 PM By: Gretta Cool RN, BSN, Kim RN, BSN Previous Signature: 05/23/2015 2:16:19 PM Version By: Regan Lemming BSN, RN Entered By: Christin Fudge on 05/23/2015 14:26:29 [name] ([id number]) -------------------------------------------------------------------------------- HPI Details Patient Name: [name] Date of Service: 05/23/2015 1:45 PM Medical Record Number: [id number] Patient Account Number: [id number] Date of Birth/Sex: [birth year] (69 y.o. Female) Treating RN: Cornell Barman Primary Care Physician: Paulita Cradle Other Clinician: Referring Physician: Paulita Cradle Treating Physician/Extender: Frann Rider in Treatment: 10 History of Present Illness Location: Ulceration on the right heel and the right elbow. Quality: Patient reports experiencing a dull pain to affected area(s). Severity: Patient states wound (s) are getting better. Duration: Patient has had the wound for < 4 weeks prior to presenting for treatment Timing: Pain in wound is Intermittent (comes and goes Context: The wound appeared gradually over time Modifying Factors: Consults to this date include:Augmentin and Bactrim and also some heel protection with duoderm Associated Signs and Symptoms: Patient reports having difficulty standing for long periods. HPI Description: 69 year old female with history of peripheral neuropathy, history of diet controlled diabetes mellitus type 2, history of alcoholism here for wound consult sent by her PCP Dr. Sherilyn Cooter. She has pressure ulcers at her right elbow, bilateral heels. Plain films of right calcaneus without acute bony process. Patient started by PCP on Augmentin, Bactrim as per orders,  DuoDerm dressings applied - reports some improvement in her ulcer since last seen. Denies fever, chills, nausea, vomiting, diarrhea. She had a right humerus fracture in the middle of May and has had no surgery and arm is in a sling. She is also been laying in the bed for quite a while. Past medical history significant for essential hypertension, osteoporosis, peripheral neuropathy, alcoholism, ataxia, personal history of breast cancer treated with surgery chemotherapy  and radiation and this was done in December 2010. she is also status post laparoscopic cholecystectomy, pilonidal cyst excision, subcutaneous port placement, partial mastectomy on the left side, skin cancer removal. 03/21/2015 -- she says overall she's been doing better and continues to smoke about 15 cigarettes a day. 03/21/2015 - her orthopedic doctor has said she may require surgery for her right humerus fracture. 04/04/2015 -- her orthopedic surgery has been scheduled for August 11. 04/18/2015 -- she is doing fine as far as her elbow and her right heel goes but she has developed some redness over prominence on her thoracic spine and wanted me to take a look at this. 05/02/2015 -- she had her surgery done and now is in a sling and support. Her back has developed a pressure injury of undetermined stage. She seems to be in better spirits. 05/16/2015 -- last week her right heel was looking great and we had healed it out but she has not been offloading appropriately and has a deep tissue injury on the right heel again. The area on her right elbow has opened out with slough and the area in the thoracic spine is also getting worse. Electronic Signature(s) Signed: 05/23/2015 2:26:42 PM By: Christin Fudge MD, [name] ([id number]) Entered By: Christin Fudge on 05/23/2015 14:26:42 [name] ([id number]) -------------------------------------------------------------------------------- Physical Exam Details Patient Name:  [name] Date of Service: 05/23/2015 1:45 PM Medical Record Number: [id number] Patient Account Number: [id number] Date of Birth/Sex: 05-04-1946 (69 y.o. Female) Treating RN: Cornell Barman Primary Care Physician: Paulita Cradle Other Clinician: Referring Physician: Paulita Cradle Treating Physician/Extender: Frann Rider in Treatment: 10 Constitutional . Pulse regular. Respirations normal and unlabored. Afebrile. . Eyes Nonicteric. Reactive to light. Ears, Nose, Mouth, and Throat Lips, teeth, and gums WNL.Marland Kitchen Moist mucosa without lesions . Neck supple and nontender. No palpable supraclavicular or cervical adenopathy. Normal sized without goiter. Respiratory WNL. No retractions.. Cardiovascular Pedal Pulses WNL. No clubbing, cyanosis or edema. Chest Breasts symmetical and no nipple discharge.. Breast tissue WNL, no masses, lumps, or tenderness.. Lymphatic No adneopathy. No adenopathy. No adenopathy. Musculoskeletal Adexa without tenderness or enlargement.. Digits and nails w/o clubbing, cyanosis, infection, petechiae, ischemia, or inflammatory conditions.. Integumentary (Hair, Skin) No suspicious lesions. No crepitus or fluctuance. No peri-wound warmth or erythema. No masses.Marland Kitchen Psychiatric Judgement and insight Intact.. No evidence of depression, anxiety, or agitation.. Notes The back has some eschar and this is fairly dry at this stage will benefit from central. Sharp dissection has been done to the right lateral elbow and this will also benefit from central. Both the heels will use silver alginate and a foam protection heel pads. Electronic Signature(s) Signed: 05/23/2015 2:27:37 PM By: Christin Fudge MD, FACS Entered By: Christin Fudge on 05/23/2015 14:27:36 Hinkley, [name] Kitchen ([id number]) -------------------------------------------------------------------------------- Physician Orders Details Patient Name: [name], Aspyn [name]. Date of Service: 05/23/2015 1:45 PM Medical  Record Patient Account Number: [id number] Number: Afful, RN, BSN, Treating RN: [birth year] (69 y.o. Mckenzie Harman Date of Birth/Sex: Female) Other Clinician: Primary Care Physician: Carrie Mew, MIRIAM Treating Siarah Deleo Referring Physician: Paulita Cradle Physician/Extender: Suella Grove in Treatment: 10 Verbal / Phone Orders: Yes Clinician: Afful, RN, BSN, Rita Read Back and Verified: Yes Diagnosis Coding Wound Cleansing Wound #2 Right Elbow o Clean wound with Normal Saline. Wound #3 Midline Back o Clean wound with Normal Saline. Wound #4 Right Calcaneous o Clean wound with Normal Saline. Anesthetic Wound #2 Right Elbow o Topical Lidocaine 4% cream applied to wound bed prior to debridement Wound #  3 Midline Back o Topical Lidocaine 4% cream applied to wound bed prior to debridement Wound #4 Right Calcaneous o Topical Lidocaine 4% cream applied to wound bed prior to debridement Skin Barriers/Peri-Wound Care Wound #2 Right Elbow o Skin Prep Wound #3 Midline Back o Skin Prep Wound #4 Right Calcaneous o Skin Prep Primary Wound Dressing Wound #2 Right Elbow o Santyl Ointment Wound #3 Midline Back [name] ([id number]) o Santyl Ointment Wound #4 Right Calcaneous o Aquacel Ag Secondary Dressing Wound #2 Right Elbow o Boardered Foam Dressing Wound #3 Midline Back o Boardered Foam Dressing Wound #4 Right Calcaneous o Boardered Foam Dressing Dressing Change Frequency Wound #2 Right Elbow o Change dressing every day. Wound #3 Midline Back o Change dressing every day. Wound #4 Right Calcaneous o Change dressing every other day. Follow-up Appointments Wound #2 Right Elbow o Return Appointment in 1 week. Wound #3 Midline Back o Return Appointment in 1 week. Wound #4 Right Calcaneous o Return Appointment in 1 week. Off-Loading Wound #2 Right Elbow o Heel suspension boot to: - Sage boots o Turn and reposition  every 2 hours Wound #3 Midline Back o Heel suspension boot to: - Sage boots o Turn and reposition every 2 hours Wound #4 Right Calcaneous o Heel suspension boot to: - Sage boots o Turn and reposition every 2 hours [name] ([id number]) Patient Medications Allergies: erythromycin, codeine, levaquin Notifications Medication Indication Start End Santyl 05/23/2015 DOSE topical 250 unit/gram ointment - ointment topical as directed Electronic Signature(s) Signed: 05/23/2015 2:25:53 PM By: Christin Fudge MD, FACS Previous Signature: 05/23/2015 2:18:22 PM Version By: Regan Lemming BSN, RN Entered By: Christin Fudge on 05/23/2015 14:25:52 Hapke, Daris [name]. ([id number]) -------------------------------------------------------------------------------- Problem List Details Patient Name: Bess, Julissa [name]. Date of Service: 05/23/2015 1:45 PM Medical Record Number: [id number] Patient Account Number: [id number] Date of Birth/Sex: 07-28-1946 (69 y.o. Female) Treating RN: Cornell Barman Primary Care Physician: Paulita Cradle Other Clinician: Referring Physician: Paulita Cradle Treating Physician/Extender: Frann Rider in Treatment: 10 Active Problems ICD-10 Encounter Code Description Active Date Diagnosis E11.621 Type 2 diabetes mellitus with foot ulcer 03/13/2015 Yes L89.613 Pressure ulcer of right heel, stage 3 03/13/2015 Yes L89.013 Pressure ulcer of right elbow, stage 3 03/13/2015 Yes F17.218 Nicotine dependence, cigarettes, with other nicotine- 03/13/2015 Yes induced disorders L89.100 Pressure ulcer of unspecified part of back, unstageable 05/02/2015 Yes Inactive Problems Resolved Problems Electronic Signature(s) Signed: 05/23/2015 2:26:07 PM By: Christin Fudge MD, FACS Entered By: Christin Fudge on 05/23/2015 14:26:07 [name] ([id number]) -------------------------------------------------------------------------------- Progress Note Details Patient Name: [name] Date  of Service: 05/23/2015 1:45 PM Medical Record Number: [id number] Patient Account Number: [id number] Date of Birth/Sex: 1945/10/11 (69 y.o. Female) Treating RN: Cornell Barman Primary Care Physician: Paulita Cradle Other Clinician: Referring Physician: Paulita Cradle Treating Physician/Extender: Frann Rider in Treatment: 10 Subjective Chief Complaint Information obtained from Patient Patient presents to the wound care center for a consult due non healing wound. Ulcers on the right elbow and the right heel for about 1 month. History of Present Illness (HPI) The following HPI elements were documented for the patient's wound: Location: Ulceration on the right heel and the right elbow. Quality: Patient reports experiencing a dull pain to affected area(s). Severity: Patient states wound (s) are getting better. Duration: Patient has had the wound for < 4 weeks prior to presenting for treatment Timing: Pain in wound is Intermittent (comes and goes Context: The wound appeared gradually over time Modifying Factors: Consults to this date include:Augmentin  and Bactrim and also some heel protection with duoderm Associated Signs and Symptoms: Patient reports having difficulty standing for long periods. 69 year old female with history of peripheral neuropathy, history of diet controlled diabetes mellitus type 2, history of alcoholism here for wound consult sent by her PCP Dr. Sherilyn Cooter. She has pressure ulcers at her right elbow, bilateral heels. Plain films of right calcaneus without acute bony process. Patient started by PCP on Augmentin, Bactrim as per orders, DuoDerm dressings applied - reports some improvement in her ulcer since last seen. Denies fever, chills, nausea, vomiting, diarrhea. She had a right humerus fracture in the middle of May and has had no surgery and arm is in a sling. She is also been laying in the bed for quite a while. Past medical history significant for essential  hypertension, osteoporosis, peripheral neuropathy, alcoholism, ataxia, personal history of breast cancer treated with surgery chemotherapy and radiation and this was done in December 2010. she is also status post laparoscopic cholecystectomy, pilonidal cyst excision, subcutaneous port placement, partial mastectomy on the left side, skin cancer removal. 03/21/2015 -- she says overall she's been doing better and continues to smoke about 15 cigarettes a day. 03/21/2015 - her orthopedic doctor has said she may require surgery for her right humerus fracture. 04/04/2015 -- her orthopedic surgery has been scheduled for August 11. 04/18/2015 -- she is doing fine as far as her elbow and her right heel goes but she has developed some redness over prominence on her thoracic spine and wanted me to take a look at this. Burklow, Lareta [name]. ([id number]) 05/02/2015 -- she had her surgery done and now is in a sling and support. Her back has developed a pressure injury of undetermined stage. She seems to be in better spirits. 05/16/2015 -- last week her right heel was looking great and we had healed it out but she has not been offloading appropriately and has a deep tissue injury on the right heel again. The area on her right elbow has opened out with slough and the area in the thoracic spine is also getting worse. Objective Constitutional Pulse regular. Respirations normal and unlabored. Afebrile. Vitals Time Taken: 1:50 PM, Height: 65 in, Weight: 118 lbs, BMI: 19.6, Temperature: 98.4 F, Pulse: 106 bpm, Respiratory Rate: 16 breaths/min, Blood Pressure: 93/61 mmHg. Eyes Nonicteric. Reactive to light. Ears, Nose, Mouth, and Throat Lips, teeth, and gums WNL.Marland Kitchen Moist mucosa without lesions . Neck supple and nontender. No palpable supraclavicular or cervical adenopathy. Normal sized without goiter. Respiratory WNL. No retractions.. Cardiovascular Pedal Pulses WNL. No clubbing, cyanosis or edema. Chest Breasts  symmetical and no nipple discharge.. Breast tissue WNL, no masses, lumps, or tenderness.. Lymphatic No adneopathy. No adenopathy. No adenopathy. Musculoskeletal Adexa without tenderness or enlargement.. Digits and nails w/o clubbing, cyanosis, infection, petechiae, ischemia, or inflammatory conditions.Marland Kitchen Psychiatric Judgement and insight Intact.. No evidence of depression, anxiety, or agitation.. General Notes: The back has some eschar and this is fairly dry at this stage will benefit from central. Gunnar [name], Elysse [name]. ([id number]) dissection has been done to the right lateral elbow and this will also benefit from central. Both the heels will use silver alginate and a foam protection heel pads. Integumentary (Hair, Skin) No suspicious lesions. No crepitus or fluctuance. No peri-wound warmth or erythema. No masses.. Wound #2 status is Open. Original cause of wound was Gradually Appeared. The wound is located on the Right Elbow. The wound measures 0.8cm length x 0.5cm width x 0.1cm depth; 0.314cm^2 area and 0.031cm^3 volume.  The wound is limited to skin breakdown. There is no tunneling or undermining noted. There is a small amount of serosanguineous drainage noted. The wound margin is distinct with the outline attached to the wound base. There is no granulation within the wound bed. There is a large (67-100%) amount of necrotic tissue within the wound bed including Adherent Slough. The periwound skin appearance exhibited: Moist. The periwound skin appearance did not exhibit: Callus, Crepitus, Excoriation, Fluctuance, Friable, Induration, Localized Edema, Rash, Scarring, Dry/Scaly, Maceration, Atrophie Blanche, Cyanosis, Ecchymosis, Hemosiderin Staining, Mottled, Pallor, Rubor, Erythema. Periwound temperature was noted as No Abnormality. The periwound has tenderness on palpation. Wound #3 status is Open. Original cause of wound was Pressure Injury. The wound is located on the Midline Back. The  wound measures 3cm length x 2cm width x 0.1cm depth; 4.712cm^2 area and 0.471cm^3 volume. The wound is limited to skin breakdown. There is no undermining noted. There is a medium amount of drainage noted. The wound margin is flat and intact. There is no granulation within the wound bed. There is a large (67-100%) amount of necrotic tissue within the wound bed including Eschar. The periwound skin appearance exhibited: Moist. The periwound skin appearance did not exhibit: Callus, Crepitus, Excoriation, Fluctuance, Friable, Induration, Localized Edema, Rash, Scarring, Dry/Scaly, Maceration, Atrophie Blanche, Cyanosis, Ecchymosis, Hemosiderin Staining, Mottled, Pallor, Rubor, Erythema. Periwound temperature was noted as No Abnormality. The periwound has tenderness on palpation. Wound #4 status is Open. Original cause of wound was Pressure Injury. The wound is located on the Right Calcaneous. The wound measures 2cm length x 2.3cm width x 0.1cm depth; 3.613cm^2 area and 0.361cm^3 volume. The wound is limited to skin breakdown. There is no tunneling noted. There is a medium amount of serosanguineous drainage noted. The wound margin is distinct with the outline attached to the wound base. There is medium (34-66%) granulation within the wound bed. There is a medium (34- 66%) amount of necrotic tissue within the wound bed including Adherent Slough. The periwound skin appearance exhibited: Moist. The periwound skin appearance did not exhibit: Callus, Crepitus, Excoriation, Fluctuance, Friable, Induration, Localized Edema, Rash, Scarring, Dry/Scaly, Maceration, Atrophie Blanche, Cyanosis, Ecchymosis, Hemosiderin Staining, Mottled, Pallor, Rubor, Erythema. Periwound temperature was noted as No Abnormality. The periwound has tenderness on palpation. Assessment Active Problems ICD-10 E11.621 - Type 2 diabetes mellitus with foot ulcer L89.613 - Pressure ulcer of right heel, stage 3 L89.013 - Pressure ulcer  of right elbow, stage 3 Devoto, Vergie [name]. ([id number]) F17.218 - Nicotine dependence, cigarettes, with other nicotine-induced disorders L89.100 - Pressure ulcer of unspecified part of back, unstageable The back has some eschar and this is fairly dry at this stage will benefit from central. Sharp dissection has been done to the right lateral elbow and this will also benefit from central. Both the heels will use silver alginate and a foam protection heel pads. I spent some time discussing offloading again and have been very precise in my instructions. We've also discussed smoking and I have told in no uncertain terms that she has to quit. As far as the nutrition goals we have discussed that in detail and also multivitamins and the type of supplement she should take. Her husband and she said that would be compliant and we will see him back next week. Procedures Wound #2 Wound #2 is a Pressure Ulcer located on the Right Elbow . There was a Non-Viable Tissue Open Wound/Selective [id number]) debridement with total area of 4 sq cm performed by Henley Blyth, Jackson Latino., MD. with the  following instrument(s): Forceps and Scissors to remove Non-Viable tissue/material including Exudate, Fibrin/Slough, and Eschar after achieving pain control using Lidocaine 4% Topical Solution. A time out was conducted prior to the start of the procedure. There was no bleeding. The procedure was tolerated well with a pain level of 0 throughout and a pain level of 0 following the procedure. Post Debridement Measurements: 0.8cm length x 0.5cm width x 0.1cm depth; 0.031cm^3 volume. Post debridement Stage noted as Category/Stage III. Post procedure Diagnosis Wound #2: Same as Pre-Procedure Plan Wound Cleansing: Wound #2 Right Elbow: Clean wound with Normal Saline. Wound #3 Midline Back: Clean wound with Normal Saline. Wound #4 Right Calcaneous: Clean wound with Normal Saline. Anesthetic: Wound #2 Right Elbow: Topical Lidocaine  4% cream applied to wound bed prior to debridement Wound #3 Midline Back: Andrades, Semira [name]. ([id number]) Topical Lidocaine 4% cream applied to wound bed prior to debridement Wound #4 Right Calcaneous: Topical Lidocaine 4% cream applied to wound bed prior to debridement Skin Barriers/Peri-Wound Care: Wound #2 Right Elbow: Skin Prep Wound #3 Midline Back: Skin Prep Wound #4 Right Calcaneous: Skin Prep Primary Wound Dressing: Wound #2 Right Elbow: Santyl Ointment Wound #3 Midline Back: Santyl Ointment Wound #4 Right Calcaneous: Aquacel Ag Secondary Dressing: Wound #2 Right Elbow: Boardered Foam Dressing Wound #3 Midline Back: Boardered Foam Dressing Wound #4 Right Calcaneous: Boardered Foam Dressing Dressing Change Frequency: Wound #2 Right Elbow: Change dressing every day. Wound #3 Midline Back: Change dressing every day. Wound #4 Right Calcaneous: Change dressing every other day. Follow-up Appointments: Wound #2 Right Elbow: Return Appointment in 1 week. Wound #3 Midline Back: Return Appointment in 1 week. Wound #4 Right Calcaneous: Return Appointment in 1 week. Off-Loading: Wound #2 Right Elbow: Heel suspension boot to: - Sage boots Turn and reposition every 2 hours Wound #3 Midline Back: Heel suspension boot to: - Sage boots Turn and reposition every 2 hours Wound #4 Right Calcaneous: Heel suspension boot to: - Sage boots Turn and reposition every 2 hours The following medication(s) was prescribed: Santyl topical 250 unit/gram ointment ointment topical as directed starting 05/23/2015 Cada, Chandni [name]. ([id number]) The back has some eschar and this is fairly dry at this stage will benefit from central. Sharp dissection has been done to the right lateral elbow and this will also benefit from central. Both the heels will use silver alginate and a foam protection heel pads. I spent some time discussing offloading again and have been very precise in my instructions. We've  also discussed smoking and I have told in no uncertain terms that she has to quit. As far as the nutrition goals we have discussed that in detail and also multivitamins and the type of supplement she should take. Her husband and she said that would be compliant and we will see him back next week. Electronic Signature(s) Signed: 05/23/2015 2:28:28 PM By: Christin Fudge MD, FACS Entered By: Christin Fudge on 05/23/2015 14:28:28 Storer, Clifford [name]. ([id number]) -------------------------------------------------------------------------------- SuperBill Details Patient Name: [name], Galilea [name]. Date of Service: 05/23/2015 Medical Record Number: [id number] Patient Account Number: [id number] Date of Birth/Sex: [birth year] (69 y.o. Female) Treating RN: Cornell Barman Primary Care Physician: Paulita Cradle Other Clinician: Referring Physician: Paulita Cradle Treating Physician/Extender: Frann Rider in Treatment: 10 Diagnosis Coding ICD-10 Codes Code Description 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, stage 3 L89.013 Pressure ulcer of right elbow, stage 3 F17.218 Nicotine dependence, cigarettes, with other nicotine-induced disorders L89.100 Pressure ulcer of unspecified part of  back, unstageable Facility Procedures CPT4 Code: 91791505 Description: [id number] - DEBRIDE WOUND 1ST 20 SQ CM OR < ICD-10 Description Diagnosis 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, stage 3 L89.013 Pressure ulcer of right elbow, stage 3 Modifier: Quantity: 1 Physician Procedures CPT4 Code: 8016553 Description: 74827 - WC PHYS DEBR WO ANESTH 20 SQ CM ICD-10 Description Diagnosis 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, stage 3 L89.013 Pressure ulcer of right elbow, stage 3 Modifier: Quantity: 1 Electronic Signature(s) Signed: 05/23/2015 2:28:45 PM By: Christin Fudge MD, FACS Entered By:  Christin Fudge on 05/23/2015 14:28:44

## 2015-05-30 ENCOUNTER — Encounter: Payer: Medicare Other | Admitting: Surgery

## 2015-05-30 DIAGNOSIS — L89613 Pressure ulcer of right heel, stage 3: Secondary | ICD-10-CM | POA: Diagnosis not present

## 2015-05-31 NOTE — Progress Notes (Addendum)
GASPARYAN, Washburn H. ([id number]) Visit Report for 05/30/2015 Chief Complaint Document Details Patient Name: BITHER, Jailynn H. Date of Service: 05/30/2015 3:15 PM Medical Record Number: [id number] Patient Account Number: [id number] Date of Birth/Sex: 10-11-1945 (69 y.o. Female) Treating RN: Cornell Barman Primary Care Physician: Paulita Cradle Other Clinician: Referring Physician: Paulita Cradle Treating Physician/Extender: Frann Rider in Treatment: 11 Information Obtained from: Patient Chief Complaint Patient presents to the wound care center for a consult due non healing wound. Ulcers on the right elbow and the right heel for about 1 month. Electronic Signature(s) Signed: 05/30/2015 4:17:57 PM By: Christin Fudge MD, FACS Previous Signature: 05/30/2015 3:53:08 PM Version By: Christin Fudge MD, FACS Entered By: Christin Fudge on 05/30/2015 16:17:57 Ulloa, Rory H. ([id number]) -------------------------------------------------------------------------------- HPI Details Patient Name: Ludden, Keli H. Date of Service: 05/30/2015 3:15 PM Medical Record Number: [id number] Patient Account Number: [id number] Date of Birth/Sex: [birth year] (69 y.o. Female) Treating RN: Cornell Barman Primary Care Physician: Paulita Cradle Other Clinician: Referring Physician: Paulita Cradle Treating Physician/Extender: Frann Rider in Treatment: 11 History of Present Illness Location: Ulceration on the right heel and the right elbow. Quality: Patient reports experiencing a dull pain to affected area(s). Severity: Patient states wound (s) are getting better. Duration: Patient has had the wound for < 4 weeks prior to presenting for treatment Timing: Pain in wound is Intermittent (comes and goes Context: The wound appeared gradually over time Modifying Factors: Consults to this date include:Augmentin and Bactrim and also some heel protection with duoderm Associated Signs and Symptoms: Patient reports  having difficulty standing for long periods. HPI Description: 69 year old female with history of peripheral neuropathy, history of diet controlled diabetes mellitus type 2, history of alcoholism here for wound consult sent by her PCP Dr. Sherilyn Cooter. She has pressure ulcers at her right elbow, bilateral heels. Plain films of right calcaneus without acute bony process. Patient started by PCP on Augmentin, Bactrim as per orders, DuoDerm dressings applied - reports some improvement in her ulcer since last seen. Denies fever, chills, nausea, vomiting, diarrhea. She had a right humerus fracture in the middle of May and has had no surgery and arm is in a sling. She is also been laying in the bed for quite a while. Past medical history significant for essential hypertension, osteoporosis, peripheral neuropathy, alcoholism, ataxia, personal history of breast cancer treated with surgery chemotherapy and radiation and this was done in December 2010. she is also status post laparoscopic cholecystectomy, pilonidal cyst excision, subcutaneous port placement, partial mastectomy on the left side, skin cancer removal. 03/21/2015 -- she says overall she's been doing better and continues to smoke about 15 cigarettes a day. 03/21/2015 - her orthopedic doctor has said she may require surgery for her right humerus fracture. 04/04/2015 -- her orthopedic surgery has been scheduled for August 11. 04/18/2015 -- she is doing fine as far as her elbow and her right heel goes but she has developed some redness over prominence on her thoracic spine and wanted me to take a look at this. 05/02/2015 -- she had her surgery done and now is in a sling and support. Her back has developed a pressure injury of undetermined stage. She seems to be in better spirits. 05/16/2015 -- last week her right heel was looking great and we had healed it out, but she has not been offloading appropriately and has a deep tissue injury on the right heel  again. The area on her right elbow has opened out with slough and the area in the  thoracic spine is also getting worse. 05/30/2015 -- she has developed 2 new ulcerations one on her left ischial tuberosity and one on the sacral region. She is still working on getting up smoking but is also unable to take her vitamins as she says she develops a diarrhea when she takes vitamins. She has increased her intake of proteins Soja, Anyelina H. ([id number]) Electronic Signature(s) Signed: 05/30/2015 4:18:52 PM By: Christin Fudge MD, FACS Previous Signature: 05/30/2015 3:53:32 PM Version By: Christin Fudge MD, FACS Entered By: Christin Fudge on 05/30/2015 16:18:52 Finken, Rhiannan H. ([id number]) -------------------------------------------------------------------------------- Physical Exam Details Patient Name: Debois, Cherina H. Date of Service: 05/30/2015 3:15 PM Medical Record Number: [id number] Patient Account Number: [id number] Date of Birth/Sex: Aug 06, 1946 (69 y.o. Female) Treating RN: Cornell Barman Primary Care Physician: Paulita Cradle Other Clinician: Referring Physician: Paulita Cradle Treating Physician/Extender: Frann Rider in Treatment: 11 Constitutional . Pulse regular. Respirations normal and unlabored. Afebrile. . Eyes Nonicteric. Reactive to light. Ears, Nose, Mouth, and Throat Lips, teeth, and gums WNL.Marland Kitchen Moist mucosa without lesions . Neck supple and nontender. No palpable supraclavicular or cervical adenopathy. Normal sized without goiter. Respiratory WNL. No retractions.. Cardiovascular Pedal Pulses WNL. No clubbing, cyanosis or edema. Lymphatic No adneopathy. No adenopathy. No adenopathy. Musculoskeletal Adexa without tenderness or enlargement.. Digits and nails w/o clubbing, cyanosis, infection, petechiae, ischemia, or inflammatory conditions.. Integumentary (Hair, Skin) No suspicious lesions. No crepitus or fluctuance. No peri-wound warmth or erythema. No  masses.Marland Kitchen Psychiatric Judgement and insight Intact.. No evidence of depression, anxiety, or agitation.. Notes The deep tissue injury on her thoracic spine continues to have a thick eschar and we will treat this with Santyl. Her right elbow looks much better. She has a stage III pressure ulcer on the sacral region and also illustrates 3 pressure ulcer on the left hip. The right heel has a stage II pressure ulcer and the left heel has an indeterminate pressure injury Electronic Signature(s) Signed: 05/30/2015 4:20:27 PM By: Christin Fudge MD, FACS Entered By: Christin Fudge on 05/30/2015 16:20:27 Barro, Andrianna H. ([id number]) -------------------------------------------------------------------------------- Physician Orders Details Patient Name: Malloy, Diamon H. Date of Service: 05/30/2015 3:15 PM Medical Record Number: [id number] Patient Account Number: [id number] Date of Birth/Sex: 1946-04-07 (69 y.o. Female) Treating RN: Cornell Barman Primary Care Physician: Paulita Cradle Other Clinician: Referring Physician: Paulita Cradle Treating Physician/Extender: Frann Rider in Treatment: 11 Verbal / Phone Orders: Yes Clinician: Cornell Barman Read Back and Verified: Yes Diagnosis Coding ICD-10 Coding Code Description 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, stage 3 L89.013 Pressure ulcer of right elbow, stage 3 F17.218 Nicotine dependence, cigarettes, with other nicotine-induced disorders L89.100 Pressure ulcer of unspecified part of back, unstageable Wound Cleansing Wound #2 Right Elbow o Clean wound with Normal Saline. Wound #3 Midline Back o Clean wound with Normal Saline. Wound #4 Right Calcaneous o Clean wound with Normal Saline. Anesthetic Wound #2 Right Elbow o Topical Lidocaine 4% cream applied to wound bed prior to debridement Wound #3 Midline Back o Topical Lidocaine 4% cream applied to wound bed prior to  debridement Wound #4 Right Calcaneous o Topical Lidocaine 4% cream applied to wound bed prior to debridement Primary Wound Dressing Wound #3 Midline Back o Santyl Ointment Wound #2 Right Elbow o Aquacel Ag Wound #4 Right Calcaneous Slauson, Arletta H. ([id number]) o Aquacel Ag Secondary Dressing Wound #2 Right Elbow o Boardered Foam Dressing Wound #3 Midline Back o Boardered Foam Dressing Wound #4 Right Calcaneous o Boardered Foam Dressing  Wound #5 Medial Sacrum o Boardered Foam Dressing Wound #6 Left Ilium o Boardered Foam Dressing Follow-up Appointments Wound #2 Right Elbow o Return Appointment in 1 week. Wound #3 Midline Back o Return Appointment in 1 week. Wound #4 Right Calcaneous o Return Appointment in 1 week. Off-Loading Wound #2 Right Elbow o Heel suspension boot to: - Sage boots o Turn and reposition every 2 hours Wound #3 Midline Back o Heel suspension boot to: - Sage boots o Turn and reposition every 2 hours Wound #4 Right Calcaneous o Heel suspension boot to: - Sage boots o Turn and reposition every 2 hours Electronic Signature(s) Signed: 05/30/2015 4:45:33 PM By: Christin Fudge MD, FACS Signed: 05/30/2015 5:32:35 PM By: Gretta Cool RN, BSN, Kim RN, BSN Entered By: Gretta Cool, RN, BSN, Kim on 05/30/2015 16:13:43 Guardado, Jaszmine H. ([id number]) Bonaparte, Hayzlee H. ([id number]) -------------------------------------------------------------------------------- Problem List Details Patient Name: Scrivener, Eleesha H. Date of Service: 05/30/2015 3:15 PM Medical Record Number: [id number] Patient Account Number: [id number] Date of Birth/Sex: 1946/02/15 (69 y.o. Female) Treating RN: Cornell Barman Primary Care Physician: Paulita Cradle Other Clinician: Referring Physician: Paulita Cradle Treating Physician/Extender: Frann Rider in Treatment: 11 Active Problems ICD-10 Encounter Code Description Active Date Diagnosis E11.621 Type 2 diabetes  mellitus with foot ulcer 03/13/2015 Yes L89.613 Pressure ulcer of right heel, stage 3 03/13/2015 Yes L89.013 Pressure ulcer of right elbow, stage 3 03/13/2015 Yes F17.218 Nicotine dependence, cigarettes, with other nicotine- 03/13/2015 Yes induced disorders L89.100 Pressure ulcer of unspecified part of back, unstageable 05/02/2015 Yes L89.222 Pressure ulcer of left hip, stage 2 05/30/2015 Yes L89.153 Pressure ulcer of sacral region, stage 3 05/30/2015 Yes Inactive Problems Resolved Problems Electronic Signature(s) Signed: 05/30/2015 4:17:45 PM By: Christin Fudge MD, FACS Previous Signature: 05/30/2015 4:17:03 PM Version By: Christin Fudge MD, FACS Previous Signature: 05/30/2015 3:52:56 PM Version By: Christin Fudge MD, FACS Entered By: Christin Fudge on 05/30/2015 16:17:45 Eichhorn, Semiah H. ([id number]) -------------------------------------------------------------------------------- Progress Note Details Patient Name: Aumiller, Prisma H. Date of Service: 05/30/2015 3:15 PM Medical Record Number: [id number] Patient Account Number: [id number] Date of Birth/Sex: 17-Nov-1945 (69 y.o. Female) Treating RN: Cornell Barman Primary Care Physician: Paulita Cradle Other Clinician: Referring Physician: Paulita Cradle Treating Physician/Extender: Frann Rider in Treatment: 11 Subjective Chief Complaint Information obtained from Patient Patient presents to the wound care center for a consult due non healing wound. Ulcers on the right elbow and the right heel for about 1 month. History of Present Illness (HPI) The following HPI elements were documented for the patient's wound: Location: Ulceration on the right heel and the right elbow. Quality: Patient reports experiencing a dull pain to affected area(s). Severity: Patient states wound (s) are getting better. Duration: Patient has had the wound for < 4 weeks prior to presenting for treatment Timing: Pain in wound is Intermittent (comes and goes Context:  The wound appeared gradually over time Modifying Factors: Consults to this date include:Augmentin and Bactrim and also some heel protection with duoderm Associated Signs and Symptoms: Patient reports having difficulty standing for long periods. 69 year old female with history of peripheral neuropathy, history of diet controlled diabetes mellitus type 2, history of alcoholism here for wound consult sent by her PCP Dr. Sherilyn Cooter. She has pressure ulcers at her right elbow, bilateral heels. Plain films of right calcaneus without acute bony process. Patient started by PCP on Augmentin, Bactrim as per orders, DuoDerm dressings applied - reports some improvement in her ulcer since last seen. Denies fever, chills, nausea, vomiting, diarrhea. She had a right humerus fracture  in the middle of May and has had no surgery and arm is in a sling. She is also been laying in the bed for quite a while. Past medical history significant for essential hypertension, osteoporosis, peripheral neuropathy, alcoholism, ataxia, personal history of breast cancer treated with surgery chemotherapy and radiation and this was done in December 2010. she is also status post laparoscopic cholecystectomy, pilonidal cyst excision, subcutaneous port placement, partial mastectomy on the left side, skin cancer removal. 03/21/2015 -- she says overall she's been doing better and continues to smoke about 15 cigarettes a day. 03/21/2015 - her orthopedic doctor has said she may require surgery for her right humerus fracture. 04/04/2015 -- her orthopedic surgery has been scheduled for August 11. 04/18/2015 -- she is doing fine as far as her elbow and her right heel goes but she has developed some redness over prominence on her thoracic spine and wanted me to take a look at this. Mcdill, Anabelen H. ([id number]) 05/02/2015 -- she had her surgery done and now is in a sling and support. Her back has developed a pressure injury of undetermined stage.  She seems to be in better spirits. 05/16/2015 -- last week her right heel was looking great and we had healed it out, but she has not been offloading appropriately and has a deep tissue injury on the right heel again. The area on her right elbow has opened out with slough and the area in the thoracic spine is also getting worse. 05/30/2015 -- she has developed 2 new ulcerations one on her left ischial tuberosity and one on the sacral region. She is still working on getting up smoking but is also unable to take her vitamins as she says she develops a diarrhea when she takes vitamins. She has increased her intake of proteins Objective Constitutional Pulse regular. Respirations normal and unlabored. Afebrile. Vitals Time Taken: 3:26 PM, Height: 65 in, Weight: 118 lbs, BMI: 19.6, Temperature: 98.4 F, Pulse: 118 bpm, Respiratory Rate: 18 breaths/min, Blood Pressure: 114/66 mmHg. Eyes Nonicteric. Reactive to light. Ears, Nose, Mouth, and Throat Lips, teeth, and gums WNL.Marland Kitchen Moist mucosa without lesions . Neck supple and nontender. No palpable supraclavicular or cervical adenopathy. Normal sized without goiter. Respiratory WNL. No retractions.. Cardiovascular Pedal Pulses WNL. No clubbing, cyanosis or edema. Lymphatic No adneopathy. No adenopathy. No adenopathy. Musculoskeletal Adexa without tenderness or enlargement.. Digits and nails w/o clubbing, cyanosis, infection, petechiae, ischemia, or inflammatory conditions.Marland Kitchen Psychiatric Judgement and insight Intact.. No evidence of depression, anxiety, or agitation.. General Notes: The deep tissue injury on her thoracic spine continues to have a thick eschar and we will Delgreco, Sophee H. ([id number]) treat this with Santyl. Her right elbow looks much better. She has a stage III pressure ulcer on the sacral region and also illustrates 3 pressure ulcer on the left hip. The right heel has a stage II pressure ulcer and the left heel has an indeterminate  pressure injury Integumentary (Hair, Skin) No suspicious lesions. No crepitus or fluctuance. No peri-wound warmth or erythema. No masses.. Wound #2 status is Open. Original cause of wound was Gradually Appeared. The wound is located on the Right Elbow. The wound measures 0.4cm length x 0.3cm width x 0.1cm depth; 0.094cm^2 area and 0.009cm^3 volume. Wound #3 status is Open. Original cause of wound was Pressure Injury. The wound is located on the Midline Back. The wound measures 3.4cm length x 1.5cm width x 0.1cm depth; 4.006cm^2 area and 0.401cm^3 volume. Wound #4 status is Open. Original cause of wound  was Pressure Injury. The wound is located on the Right Calcaneous. The wound measures 1.5cm length x 1.7cm width x 0.1cm depth; 2.003cm^2 area and 0.2cm^3 volume. Wound #5 status is Open. Original cause of wound was Pressure Injury. The wound is located on the Medial Sacrum. The wound measures 1.5cm length x 0.5cm width x 0.1cm depth; 0.589cm^2 area and 0.059cm^3 volume. The wound is limited to skin breakdown. There is a small amount of serous drainage noted. The wound margin is flat and intact. There is small (1-33%) pink granulation within the wound bed. There is a small (1-33%) amount of necrotic tissue within the wound bed including Adherent Slough. The periwound skin appearance did not exhibit: Callus, Crepitus, Excoriation, Fluctuance, Friable, Induration, Localized Edema, Rash, Scarring, Dry/Scaly, Maceration, Moist, Atrophie Blanche, Cyanosis, Ecchymosis, Hemosiderin Staining, Mottled, Pallor, Rubor, Erythema. Wound #6 status is Open. Original cause of wound was Pressure Injury. The wound is located on the Left Ilium. The wound measures 0.3cm length x 0.2cm width x 0.1cm depth; 0.047cm^2 area and 0.005cm^3 volume. Other Condition(s) Patient presents with Suspected Deep Tissue Injury located on the Left Foot. The skin appearance exhibited: Ecchymosis. The skin appearance did not  exhibit: Atrophie Blanche, Callus, Crepitus, Cyanosis, Dry/Scaly, Erythema, Excoriation, Fluctuance, Friable, Hemosiderin Staining, Induration, Localized Edema, Maceration, Moist, Mottled, Pallor, Rash, Rubor, Scarring. Assessment Active Problems ICD-10 E11.621 - Type 2 diabetes mellitus with foot ulcer L89.613 - Pressure ulcer of right heel, stage 3 L89.013 - Pressure ulcer of right elbow, stage 3 F17.218 - Nicotine dependence, cigarettes, with other nicotine-induced disorders Deblanc, Sharnae H. ([id number]) L89.100 - Pressure ulcer of unspecified part of back, unstageable U23.536 - Pressure ulcer of left hip, stage 2 L89.153 - Pressure ulcer of sacral region, stage 3 Details of her offloading and heel protectors have been discussed with her and the nursing staff. I have also recommended a low air loss mattress and the patient's husband will obtain one as he has one from previous use. if he cannot use that particular one I have asked him to give was a call and we will organize to get it authorized to his insurance company. We have again discussed off-loading, smoking cessation and nutritional supplement. They will come back and see me next week Plan Wound Cleansing: Wound #2 Right Elbow: Clean wound with Normal Saline. Wound #3 Midline Back: Clean wound with Normal Saline. Wound #4 Right Calcaneous: Clean wound with Normal Saline. Anesthetic: Wound #2 Right Elbow: Topical Lidocaine 4% cream applied to wound bed prior to debridement Wound #3 Midline Back: Topical Lidocaine 4% cream applied to wound bed prior to debridement Wound #4 Right Calcaneous: Topical Lidocaine 4% cream applied to wound bed prior to debridement Primary Wound Dressing: Wound #3 Midline Back: Santyl Ointment Wound #2 Right Elbow: Aquacel Ag Wound #4 Right Calcaneous: Aquacel Ag Secondary Dressing: Wound #2 Right Elbow: Boardered Foam Dressing Wound #3 Midline Back: Boardered Foam Dressing Humphrey, Samentha  H. ([id number]) Wound #4 Right Calcaneous: Boardered Foam Dressing Wound #5 Medial Sacrum: Boardered Foam Dressing Wound #6 Left Ilium: Boardered Foam Dressing Follow-up Appointments: Wound #2 Right Elbow: Return Appointment in 1 week. Wound #3 Midline Back: Return Appointment in 1 week. Wound #4 Right Calcaneous: Return Appointment in 1 week. Off-Loading: Wound #2 Right Elbow: Heel suspension boot to: - Sage boots Turn and reposition every 2 hours Wound #3 Midline Back: Heel suspension boot to: - Sage boots Turn and reposition every 2 hours Wound #4 Right Calcaneous: Heel suspension boot to: - [employer] and  reposition every 2 hours Details of her offloading and heel protectors have been discussed with her and the nursing staff. I have also recommended a low air loss mattress and the patient's husband will obtain one as he has one from previous use. if he cannot use that particular one I have asked him to give was a call and we will organize to get it authorized to his insurance company. We have again discussed off-loading, smoking cessation and nutritional supplement. They will come back and see me next week Electronic Signature(s) Signed: 05/30/2015 4:22:35 PM By: Christin Fudge MD, FACS Entered By: Christin Fudge on 05/30/2015 16:22:35 Kercher, Rondia H. ([id number]) -------------------------------------------------------------------------------- SuperBill Details Patient Name: [name], [name] Date of Service: 05/30/2015 Medical Record Number: [id number] Patient Account Number: [id number] Date of Birth/Sex: 1946/02/14 (69 y.o. Female) Treating RN: Cornell Barman Primary Care Physician: Paulita Cradle Other Clinician: Referring Physician: Paulita Cradle Treating Physician/Extender: Frann Rider in Treatment: 11 Diagnosis Coding ICD-10 Codes Code Description 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, stage  3 L89.013 Pressure ulcer of right elbow, stage 3 F17.218 Nicotine dependence, cigarettes, with other nicotine-induced disorders L89.100 Pressure ulcer of unspecified part of back, unstageable L89.222 Pressure ulcer of left hip, stage 2 L89.153 Pressure ulcer of sacral region, stage 3 Facility Procedures CPT4 Code: [id number] Description: 91916 - WOUND CARE VISIT-LEV 5 EST PT Modifier: Quantity: 1 Physician Procedures CPT4 Code: 6060045 Description: 99774 - WC PHYS LEVEL 3 - EST PT ICD-10 Description Diagnosis 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, stage 3 L89.100 Pressure ulcer of unspecified part of back, uns L89.153 Pressure ulcer of  sacral region, stage 3 Modifier: tageable Quantity: 1 Electronic Signature(s) Signed: 05/30/2015 4:23:05 PM By: Christin Fudge MD, FACS Entered By: Christin Fudge on 05/30/2015 16:23:05

## 2015-05-31 NOTE — Progress Notes (Signed)
[name], [name] ([id number]) Visit Report for 05/30/2015 Arrival Information Details Patient Name: [name], [name] Date of Service: 05/30/2015 3:15 PM Medical Record Number: [id number] Patient Account Number: [id number] Date of Birth/Sex: 1946-07-08 (69 y.o. Female) Treating RN: Cornell Barman Primary Care Physician: Paulita Cradle Other Clinician: Referring Physician: Paulita Cradle Treating Physician/Extender: Frann Rider in Treatment: 11 Visit Information History Since Last Visit Added or deleted any medications: No Patient Arrived: Wheel Chair Any new allergies or adverse reactions: No Arrival Time: 15:27 Had a fall or experienced change in No activities of daily living that may affect Accompanied By: husband risk of falls: Transfer Assistance: None Signs or symptoms of abuse/neglect since last No Patient Identification Verified: Yes visito Secondary Verification Process Yes Hospitalized since last visit: No Completed: Has Dressing in Place as Prescribed: Yes Patient Requires Transmission-Based No Pain Present Now: No Precautions: Patient Has Alerts: No Electronic Signature(s) Signed: 05/30/2015 5:32:35 PM By: Gretta Cool, RN, BSN, Kim RN, BSN Entered By: Gretta Cool, RN, BSN, Kim on 05/30/2015 15:28:19 [name], [name] ([id number]) -------------------------------------------------------------------------------- Clinic Level of Care Assessment Details Patient Name: [name], [name] Date of Service: 05/30/2015 3:15 PM Medical Record Number: [id number] Patient Account Number: [id number] Date of Birth/Sex: [birth year] (69 y.o. Female) Treating RN: Cornell Barman Primary Care Physician: Paulita Cradle Other Clinician: Referring Physician: Paulita Cradle Treating Physician/Extender: Frann Rider in Treatment: 11 Clinic Level of Care Assessment Items TOOL 4 Quantity Score []  - Use when only an EandM is performed on FOLLOW-UP visit 0 ASSESSMENTS - Nursing Assessment /  Reassessment []  - Reassessment of Co-morbidities (includes updates in patient status) 0 X - Reassessment of Adherence to Treatment Plan 1 5 ASSESSMENTS - Wound and Skin Assessment / Reassessment []  - Simple Wound Assessment / Reassessment - one wound 0 X - Complex Wound Assessment / Reassessment - multiple wounds 5 5 []  - Dermatologic / Skin Assessment (not related to wound area) 0 ASSESSMENTS - Focused Assessment []  - Circumferential Edema Measurements - multi extremities 0 []  - Nutritional Assessment / Counseling / Intervention 0 []  - Lower Extremity Assessment (monofilament, tuning fork, pulses) 0 []  - Peripheral Arterial Disease Assessment (using hand held doppler) 0 ASSESSMENTS - Ostomy and/or Continence Assessment and Care []  - Incontinence Assessment and Management 0 []  - Ostomy Care Assessment and Management (repouching, etc.) 0 PROCESS - Coordination of Care X - Simple Patient / Family Education for ongoing care 1 15 []  - Complex (extensive) Patient / Family Education for ongoing care 0 X - Staff obtains Programmer, systems, Records, Test Results / Process Orders 1 10 []  - Staff telephones HHA, Nursing Homes / Clarify orders / etc 0 []  - Routine Transfer to another Facility (non-emergent condition) 0 [name], [name] ([id number]) []  - Routine Hospital Admission (non-emergent condition) 0 []  - New Admissions / Biomedical engineer / Ordering NPWT, Apligraf, etc. 0 []  - Emergency Hospital Admission (emergent condition) 0 X - Simple Discharge Coordination 1 10 []  - Complex (extensive) Discharge Coordination 0 PROCESS - Special Needs []  - Pediatric / Minor Patient Management 0 []  - Isolation Patient Management 0 []  - Hearing / Language / Visual special needs 0 []  - Assessment of Community assistance (transportation, D/C planning, etc.) 0 []  - Additional assistance / Altered mentation 0 []  - Support Surface(s) Assessment (bed, cushion, seat, etc.) 0 INTERVENTIONS - Wound Cleansing /  Measurement []  - Simple Wound Cleansing - one wound 0 X - Complex Wound Cleansing - multiple wounds 5 5 X - Wound Imaging (photographs - any number  of wounds) 1 5 []  - Wound Tracing (instead of photographs) 0 []  - Simple Wound Measurement - one wound 0 X - Complex Wound Measurement - multiple wounds 5 5 INTERVENTIONS - Wound Dressings []  - Small Wound Dressing one or multiple wounds 0 X - Medium Wound Dressing one or multiple wounds 5 15 []  - Large Wound Dressing one or multiple wounds 0 []  - Application of Medications - topical 0 []  - Application of Medications - injection 0 INTERVENTIONS - Miscellaneous []  - External ear exam 0 [name], [name] ([id number]) []  - Specimen Collection (cultures, biopsies, blood, body fluids, etc.) 0 []  - Specimen(s) / Culture(s) sent or taken to Lab for analysis 0 []  - Patient Transfer (multiple staff / Civil Service fast streamer / Similar devices) 0 []  - Simple Staple / Suture removal (25 or less) 0 []  - Complex Staple / Suture removal (26 or more) 0 []  - Hypo / Hyperglycemic Management (close monitor of Blood Glucose) 0 []  - Ankle / Brachial Index (ABI) - do not check if billed separately 0 X - Vital Signs 1 5 Has the patient been seen at the hospital within the last three years: Yes Total Score: 200 Level Of Care: New/Established - Level 5 Electronic Signature(s) Signed: 05/30/2015 5:32:35 PM By: Gretta Cool, RN, BSN, Kim RN, BSN Entered By: Gretta Cool, RN, BSN, Kim on 05/30/2015 16:15:34 [name], [name] ([id number]) -------------------------------------------------------------------------------- Encounter Discharge Information Details Patient Name: [name], [name] Date of Service: 05/30/2015 3:15 PM Medical Record Number: [id number] Patient Account Number: [id number] Date of Birth/Sex: 06-08-1946 (69 y.o. Female) Treating RN: Cornell Barman Primary Care Physician: Paulita Cradle Other Clinician: Referring Physician: Paulita Cradle Treating Physician/Extender: Frann Rider in Treatment: 11 Encounter Discharge Information Items Discharge Pain Level: 1 Discharge Condition: Stable Ambulatory Status: Wheelchair Discharge Destination: Home Transportation: Private Auto Accompanied By: self Schedule Follow-up Appointment: Yes Medication Reconciliation completed and provided to Patient/Care Yes Tasnim Balentine: Provided on Clinical Summary of Care: 05/30/2015 Form Type Recipient Paper Patient AS Electronic Signature(s) Signed: 05/30/2015 5:32:35 PM By: Gretta Cool RN, BSN, Kim RN, BSN Previous Signature: 05/30/2015 4:01:38 PM Version By: Lorine Bears RCP, RRT, CHT Entered By: Gretta Cool, RN, BSN, Kim on 05/30/2015 16:17:31 Walck, Latysha H. ([id number]) -------------------------------------------------------------------------------- Lower Extremity Assessment Details Patient Name: [name], [name] Date of Service: 05/30/2015 3:15 PM Medical Record Number: [id number] Patient Account Number: [id number] Date of Birth/Sex: 06-12-1946 (69 y.o. Female) Treating RN: Cornell Barman Primary Care Physician: Paulita Cradle Other Clinician: Referring Physician: Paulita Cradle Treating Physician/Extender: Frann Rider in Treatment: 11 Vascular Assessment Pulses: Posterior Tibial Dorsalis Pedis Palpable: [Left:Yes] [Right:Yes] Toe Nail Assessment Left: Right: Thick: Yes Yes Discolored: Yes Yes Deformed: No No Improper Length and Hygiene: No No Electronic Signature(s) Signed: 05/30/2015 5:32:35 PM By: Gretta Cool, RN, BSN, Kim RN, BSN Entered By: Gretta Cool, RN, BSN, Kim on 05/30/2015 15:47:11 Kirschenmann, Amariyana H. ([id number]) -------------------------------------------------------------------------------- Multi Wound Chart Details Patient Name: [name], Tayona H. Date of Service: 05/30/2015 3:15 PM Medical Record Number: [id number] Patient Account Number: [id number] Date of Birth/Sex: 1945/11/29 (69 y.o. Female) Treating RN: Cornell Barman Primary Care Physician:  Paulita Cradle Other Clinician: Referring Physician: Paulita Cradle Treating Physician/Extender: Frann Rider in Treatment: 11 Vital Signs Height(in): 65 Pulse(bpm): 118 Weight(lbs): 118 Blood Pressure 114/66 (mmHg): Body Mass Index(BMI): 20 Temperature(F): 98.4 Respiratory Rate 18 (breaths/min): Photos: [2:No Photos] [3:No Photos] [4:No Photos] Wound Location: [2:Right Elbow] [3:Midline Back] [4:Right Calcaneous] Wounding Event: [2:Gradually Appeared] [3:Pressure Injury] [4:Pressure Injury] Primary Etiology: [2:Pressure Ulcer] [3:Pressure Ulcer] [4:Pressure Ulcer] Comorbid History: [  2:N/A] [3:N/A] [4:N/A] Date Acquired: [2:02/25/2015] [3:05/02/2015] [4:05/11/2015] Weeks of Treatment: [2:11] [3:4] [4:2] Wound Status: [2:Open] [3:Open] [4:Open] Measurements L x W x D 0.4x0.3x0.1 [3:3.4x1.5x0.1] [4:1.5x1.7x0.1] (cm) Area (cm) : [2:0.094] [3:4.006] [4:2.003] Volume (cm) : [2:0.009] [3:0.401] [4:0.2] % Reduction in Area: [2:95.00%] [3:-1720.90%] [4:44.60%] % Reduction in Volume: 97.60% [3:-1722.70%] [4:44.60%] Classification: [2:Category/Stage III] [3:Category/Stage II] [4:Unstageable/Unclassified] Exudate Amount: [2:N/A] [3:N/A] [4:N/A] Exudate Type: [2:N/A] [3:N/A] [4:N/A] Exudate Color: [2:N/A] [3:N/A] [4:N/A] Wound Margin: [2:N/A] [3:N/A] [4:N/A] Granulation Amount: [2:N/A] [3:N/A] [4:N/A] Granulation Quality: [2:N/A] [3:N/A] [4:N/A] Necrotic Amount: [2:N/A] [3:N/A] [4:N/A] Periwound Skin Texture: No Abnormalities Noted [3:No Abnormalities Noted] [4:No Abnormalities Noted] Periwound Skin [2:No Abnormalities Noted] [3:No Abnormalities Noted] [4:No Abnormalities Noted] Moisture: Periwound Skin Color: No Abnormalities Noted [3:No Abnormalities Noted] [4:No Abnormalities Noted] Tenderness on [2:No] [3:No] [4:No] Palpation: Wound Preparation: [2:N/A] [3:N/A] [4:N/A] Wound Number: 5 6 N/A Photos: No Photos No Photos N/A Wound Location: Sacrum - Medial Left  [place] N/A Wounding Event: Pressure Injury Pressure Injury N/A Primary Etiology: Pressure Ulcer Pressure Ulcer N/A Comorbid History: Cataracts, Asthma, N/A N/A Hypertension, Type II Diabetes, Neuropathy, Received Chemotherapy Date Acquired: 05/19/2015 05/30/2015 N/A Weeks of Treatment: 0 0 N/A Wound Status: Open Open N/A Measurements L x W x D 1.5x0.5x0.1 0.3x0.2x0.1 N/A (cm) Area (cm) : 0.589 0.047 N/A Volume (cm) : 0.059 0.005 N/A % Reduction in Area: 0.00% N/A N/A % Reduction in Volume: 0.00% N/A N/A Classification: Category/Stage II N/A N/A Exudate Amount: Small N/A N/A Exudate Type: Serous N/A N/A Exudate Color: amber N/A N/A Wound Margin: Flat and Intact N/A N/A Granulation Amount: Small (1-33%) N/A N/A Granulation Quality: Pink N/A N/A Necrotic Amount: Small (1-33%) N/A N/A Exposed Structures: Fascia: No N/A N/A Fat: No Tendon: No Muscle: No Joint: No Bone: No Limited to Skin Breakdown Periwound Skin Texture: Edema: No No Abnormalities Noted N/A Excoriation: No Induration: No Callus: No Crepitus: No Fluctuance: No Friable: No Rash: No Scarring: No Periwound Skin Maceration: No No Abnormalities Noted N/A Moisture: Moist: No Dry/Scaly: No Periwound Skin Color: Atrophie Blanche: No No Abnormalities Noted N/A Cyanosis: No Ecchymosis: No [name], [name] ([id number]) Erythema: No Hemosiderin Staining: No Mottled: No Pallor: No Rubor: No Tenderness on No No N/A Palpation: Wound Preparation: Ulcer Cleansing: N/A N/A Rinsed/Irrigated with Saline Topical Anesthetic Applied: None Treatment Notes Electronic Signature(s) Signed: 05/30/2015 5:32:35 PM By: Gretta Cool, RN, BSN, Kim RN, BSN Entered By: Gretta Cool, RN, BSN, Kim on 05/30/2015 15:53:25 [name], Azula H. ([id number]) -------------------------------------------------------------------------------- Multi-Disciplinary Care Plan Details Patient Name: [name], [name] Date of Service: 05/30/2015 3:15 PM Medical  Record Number: [id number] Patient Account Number: [id number] Date of Birth/Sex: 04/22/1946 (69 y.o. Female) Treating RN: Cornell Barman Primary Care Physician: Paulita Cradle Other Clinician: Referring Physician: Paulita Cradle Treating Physician/Extender: Frann Rider in Treatment: 11 Active Inactive Abuse / Safety / Falls / Self Care Management Nursing Diagnoses: Impaired home maintenance Impaired physical mobility Knowledge deficit related to: safety; personal, health (wound), emergency Potential for falls Self care deficit: actual or potential Goals: Patient will remain injury free Date Initiated: 03/13/2015 Goal Status: Active Patient/caregiver will verbalize understanding of skin care regimen Date Initiated: 03/13/2015 Goal Status: Active Patient/caregiver will verbalize/demonstrate measure taken to improve self care Date Initiated: 03/13/2015 Goal Status: Active Patient/caregiver will verbalize/demonstrate measures taken to improve the patient's personal safety Date Initiated: 03/13/2015 Goal Status: Active Patient/caregiver will verbalize/demonstrate measures taken to prevent injury and/or falls Date Initiated: 03/13/2015 Goal Status: Active Patient/caregiver will verbalize/demonstrate understanding of what to do in case of emergency Date Initiated:  03/13/2015 Goal Status: Active Interventions: Assess fall risk on admission and as needed Assess: immobility, friction, shearing, incontinence upon admission and as needed Assess impairment of mobility on admission and as needed per policy Assess self care needs on admission and as needed Provide education on basic hygiene Prows, Amelita H. ([id number]) Provide education on fall prevention Provide education on personal and home safety Provide education on safe transfers Treatment Activities: Education provided on Basic Hygiene : 03/13/2015 Notes: Orientation to the Wound Care Program Nursing Diagnoses: Knowledge  deficit related to the wound healing center program Goals: Patient/caregiver will verbalize understanding of the [place] Date Initiated: 03/13/2015 Goal Status: Active Interventions: Provide education on orientation to the wound center Notes: Pressure Nursing Diagnoses: Knowledge deficit related to causes and risk factors for pressure ulcer development Knowledge deficit related to management of pressures ulcers Potential for impaired tissue integrity related to pressure, friction, moisture, and shear Goals: Patient will remain free from development of additional pressure ulcers Date Initiated: 03/13/2015 Goal Status: Active Patient will remain free of pressure ulcers Date Initiated: 03/13/2015 Goal Status: Active Patient/caregiver will verbalize risk factors for pressure ulcer development Date Initiated: 03/13/2015 Goal Status: Active Patient/caregiver will verbalize understanding of pressure ulcer management Date Initiated: 03/13/2015 Goal Status: Active Interventions: Assess: immobility, friction, shearing, incontinence upon admission and as needed Mosley, Hadasah H. ([id number]) Assess offloading mechanisms upon admission and as needed Assess potential for pressure ulcer upon admission and as needed Provide education on pressure ulcers Treatment Activities: Patient referred for home evaluation of offloading devices/mattresses : 05/30/2015 Patient referred for pressure reduction/relief devices : 05/30/2015 Patient referred for seating evaluation to ensure proper offloading : 05/30/2015 Pressure reduction/relief device ordered : 05/30/2015 Test ordered outside of clinic : 05/30/2015 Notes: Wound/Skin Impairment Nursing Diagnoses: Impaired tissue integrity Knowledge deficit related to ulceration/compromised skin integrity Goals: Patient/caregiver will verbalize understanding of skin care regimen Date Initiated: 03/13/2015 Goal Status: Active Ulcer/skin breakdown  will have a volume reduction of 30% by week 4 Date Initiated: 03/13/2015 Goal Status: Active Ulcer/skin breakdown will have a volume reduction of 50% by week 8 Date Initiated: 03/13/2015 Goal Status: Active Ulcer/skin breakdown will have a volume reduction of 80% by week 12 Date Initiated: 03/13/2015 Goal Status: Active Ulcer/skin breakdown will heal within 14 weeks Date Initiated: 03/13/2015 Goal Status: Active Interventions: Assess patient/caregiver ability to obtain necessary supplies Assess patient/caregiver ability to perform ulcer/skin care regimen upon admission and as needed Assess ulceration(s) every visit Provide education on smoking Provide education on ulcer and skin care Treatment Activities: Patient referred to home care : 05/30/2015 CRICHLOW, Kiran H. ([id number]) Referred to DME Lilliauna Van for dressing supplies : 05/30/2015 Skin care regimen initiated : 05/30/2015 Topical wound management initiated : 05/30/2015 Notes: Electronic Signature(s) Signed: 05/30/2015 5:32:35 PM By: Gretta Cool, RN, BSN, Kim RN, BSN Entered By: Gretta Cool, RN, BSN, Kim on 05/30/2015 15:53:19 Bastin, Leniyah H. ([id number]) -------------------------------------------------------------------------------- Non-Wound Condition Assessment Details Patient Name: Agosto, Modene H. Date of Service: 05/30/2015 3:15 PM Medical Record Number: [id number] Patient Account Number: [id number] Date of Birth/Sex: 26-Apr-1946 (69 y.o. Female) Treating RN: Cornell Barman Primary Care Physician: Paulita Cradle Other Clinician: Referring Physician: Paulita Cradle Treating Physician/Extender: Frann Rider in Treatment: 11 Non-Wound Condition: Condition: Suspected Deep Tissue Injury Location: Foot Side: Left Periwound Skin Texture Texture Color No Abnormalities Noted: No No Abnormalities Noted: No Callus: No Atrophie Blanche: No Crepitus: No Cyanosis: No Excoriation: No Ecchymosis: Yes Fluctuance: No Erythema:  No Friable: No Hemosiderin Staining: No Induration: No Mottled: No  Localized Edema: No Pallor: No Rash: No Rubor: No Scarring: No Moisture No Abnormalities Noted: No Dry / Scaly: No Maceration: No Moist: No Electronic Signature(s) Signed: 05/30/2015 5:32:35 PM By: Gretta Cool, RN, BSN, Kim RN, BSN Entered By: Gretta Cool, RN, BSN, Kim on 05/30/2015 15:53:08 Lehane, Amyra H. ([id number]) -------------------------------------------------------------------------------- Pain Assessment Details Patient Name: Pigman, Emmanuelle H. Date of Service: 05/30/2015 3:15 PM Medical Record Number: [id number] Patient Account Number: [id number] Date of Birth/Sex: November 23, 1945 (69 y.o. Female) Treating RN: Cornell Barman Primary Care Physician: Paulita Cradle Other Clinician: Referring Physician: Paulita Cradle Treating Physician/Extender: Frann Rider in Treatment: 11 Active Problems Location of Pain Severity and Description of Pain Patient Has Paino Yes Site Locations Pain Management and Medication Current Pain Management: Electronic Signature(s) Signed: 05/30/2015 5:32:35 PM By: Gretta Cool, RN, BSN, Kim RN, BSN Entered By: Gretta Cool, RN, BSN, Kim on 05/30/2015 15:28:30 Kingsford, Ashara Lemmie Evens ([id number]) -------------------------------------------------------------------------------- Patient/Caregiver Education Details Patient Name: [name], [name] Date of Service: 05/30/2015 3:15 PM Medical Record Number: [id number] Patient Account Number: [id number] Date of Birth/Gender: Oct 24, 1945 (69 y.o. Female) Treating RN: Cornell Barman Primary Care Physician: Paulita Cradle Other Clinician: Referring Physician: Paulita Cradle Treating Physician/Extender: Frann Rider in Treatment: 11 Education Assessment Education Provided To: Patient Education Topics Provided Wound/Skin Impairment: Handouts: Caring for Your Ulcer, Other: continue wounf care as prescribed Electronic Signature(s) Signed: 05/30/2015  5:32:35 PM By: Gretta Cool, RN, BSN, Kim RN, BSN Entered By: Gretta Cool, RN, BSN, Kim on 05/30/2015 16:17:52 Notte, Mikenna H. ([id number]) -------------------------------------------------------------------------------- Wound Assessment Details Patient Name: [name], [name] Date of Service: 05/30/2015 3:15 PM Medical Record Number: [id number] Patient Account Number: [id number] Date of Birth/Sex: 1946/03/06 (69 y.o. Female) Treating RN: Cornell Barman Primary Care Physician: Paulita Cradle Other Clinician: Referring Physician: Paulita Cradle Treating Physician/Extender: Frann Rider in Treatment: 11 Wound Status Wound Number: 2 Primary Etiology: Pressure Ulcer Wound Location: Right Elbow Wound Status: Open Wounding Event: Gradually Appeared Date Acquired: 02/25/2015 Weeks Of Treatment: 11 Clustered Wound: No Photos Photo Uploaded By: Gretta Cool, RN, BSN, Kim on 05/30/2015 17:13:31 Wound Measurements Length: (cm) 0.4 Width: (cm) 0.3 Depth: (cm) 0.1 Area: (cm) 0.094 Volume: (cm) 0.009 % Reduction in Area: 95% % Reduction in Volume: 97.6% Wound Description Classification: Category/Stage III Periwound Skin Texture Texture Color No Abnormalities Noted: No No Abnormalities Noted: No Moisture No Abnormalities Noted: No Treatment Notes Wound #2 (Right Elbow) 1. Cleansed with: Koppelman, Alani H. ([id number]) Clean wound with Normal Saline 2. Anesthetic Topical Lidocaine 4% cream to wound bed prior to debridement 4. Dressing Applied: Aquacel Ag 5. Secondary Dressing Applied Bordered Foam Dressing Electronic Signature(s) Signed: 05/30/2015 5:32:35 PM By: Gretta Cool, RN, BSN, Kim RN, BSN Entered By: Gretta Cool, RN, BSN, Kim on 05/30/2015 15:48:26 Bahri, Garland H. ([id number]) -------------------------------------------------------------------------------- Wound Assessment Details Patient Name: [name], [name] Date of Service: 05/30/2015 3:15 PM Medical Record Number: [id number] Patient Account  Number: [id number] Date of Birth/Sex: 18-Jan-1946 (69 y.o. Female) Treating RN: Cornell Barman Primary Care Physician: Paulita Cradle Other Clinician: Referring Physician: Paulita Cradle Treating Physician/Extender: Frann Rider in Treatment: 11 Wound Status Wound Number: 3 Primary Etiology: Pressure Ulcer Wound Location: Midline Back Wound Status: Open Wounding Event: Pressure Injury Date Acquired: 05/02/2015 Weeks Of Treatment: 4 Clustered Wound: No Photos Photo Uploaded By: Gretta Cool, RN, BSN, Kim on 05/30/2015 17:13:03 Wound Measurements Length: (cm) 3.4 Width: (cm) 1.5 Depth: (cm) 0.1 Area: (cm) 4.006 Volume: (cm) 0.401 % Reduction in Area: -1720.9% % Reduction in Volume: -1722.7% Wound Description Classification: Category/Stage II Periwound Skin Texture Texture Color No Abnormalities  Noted: No No Abnormalities Noted: No Moisture No Abnormalities Noted: No Treatment Notes Wound #3 (Midline Back) 1. Cleansed with: Assad, Saanya H. ([id number]) Clean wound with Normal Saline 4. Dressing Applied: Santyl Ointment 5. Secondary Dressing Applied Bordered Foam Dressing Electronic Signature(s) Signed: 05/30/2015 5:32:35 PM By: Gretta Cool, RN, BSN, Kim RN, BSN Entered By: Gretta Cool, RN, BSN, Kim on 05/30/2015 15:48:27 Blomquist, Malayna H. ([id number]) -------------------------------------------------------------------------------- Wound Assessment Details Patient Name: [name], [name] Date of Service: 05/30/2015 3:15 PM Medical Record Number: [id number] Patient Account Number: [id number] Date of Birth/Sex: [birth year] (69 y.o. Female) Treating RN: Cornell Barman Primary Care Physician: Paulita Cradle Other Clinician: Referring Physician: Paulita Cradle Treating Physician/Extender: Frann Rider in Treatment: 11 Wound Status Wound Number: 4 Primary Etiology: Pressure Ulcer Wound Location: Right Calcaneous Wound Status: Open Wounding Event: Pressure Injury Date  Acquired: 05/11/2015 Weeks Of Treatment: 2 Clustered Wound: No Photos Photo Uploaded By: Gretta Cool, RN, BSN, Kim on 05/30/2015 17:13:32 Wound Measurements Length: (cm) 1.5 Width: (cm) 1.7 Depth: (cm) 0.1 Area: (cm) 2.003 Volume: (cm) 0.2 % Reduction in Area: 44.6% % Reduction in Volume: 44.6% Wound Description Classification: Unstageable/Unclassified Periwound Skin Texture Texture Color No Abnormalities Noted: No No Abnormalities Noted: No Moisture No Abnormalities Noted: No Treatment Notes Wound #4 (Right Calcaneous) 1. Cleansed with: Sibilia, Vendela H. ([id number]) Clean wound with Normal Saline 2. Anesthetic Topical Lidocaine 4% cream to wound bed prior to debridement 4. Dressing Applied: Aquacel Ag 5. Secondary Dressing Applied Bordered Foam Dressing Electronic Signature(s) Signed: 05/30/2015 5:32:35 PM By: Gretta Cool, RN, BSN, Kim RN, BSN Entered By: Gretta Cool, RN, BSN, Kim on 05/30/2015 15:48:27 Derner, Jeniyah H. ([id number]) -------------------------------------------------------------------------------- Wound Assessment Details Patient Name: [name], [name] Date of Service: 05/30/2015 3:15 PM Medical Record Number: [id number] Patient Account Number: [id number] Date of Birth/Sex: 17-Oct-1945 (69 y.o. Female) Treating RN: Cornell Barman Primary Care Physician: Paulita Cradle Other Clinician: Referring Physician: Paulita Cradle Treating Physician/Extender: Frann Rider in Treatment: 11 Wound Status Wound Number: 5 Primary Pressure Ulcer Etiology: Wound Location: Sacrum - Medial Wound Open Wounding Event: Pressure Injury Status: Date Acquired: 05/19/2015 Comorbid Cataracts, Asthma, Hypertension, Type Weeks Of Treatment: 0 History: II Diabetes, Neuropathy, Received Clustered Wound: No Chemotherapy Photos Photo Uploaded By: Gretta Cool, RN, BSN, Kim on 05/30/2015 17:14:00 Wound Measurements Length: (cm) 1.5 Width: (cm) 0.5 Depth: (cm) 0.1 Area: (cm) 0.589 Volume:  (cm) 0.059 % Reduction in Area: 0% % Reduction in Volume: 0% Wound Description Classification: Category/Stage II Wound Margin: Flat and Intact Exudate Amount: Small Exudate Type: Serous Exudate Color: amber Wound Bed Granulation Amount: Small (1-33%) Exposed Structure Granulation Quality: Pink Fascia Exposed: No Necrotic Amount: Small (1-33%) Fat Layer Exposed: No Necrotic Quality: Adherent Slough Tendon Exposed: No Pascucci, Olina H. ([id number]) Muscle Exposed: No Joint Exposed: No Bone Exposed: No Limited to Skin Breakdown Periwound Skin Texture Texture Color No Abnormalities Noted: No No Abnormalities Noted: No Callus: No Atrophie Blanche: No Crepitus: No Cyanosis: No Excoriation: No Ecchymosis: No Fluctuance: No Erythema: No Friable: No Hemosiderin Staining: No Induration: No Mottled: No Localized Edema: No Pallor: No Rash: No Rubor: No Scarring: No Moisture No Abnormalities Noted: No Dry / Scaly: No Maceration: No Moist: No Wound Preparation Ulcer Cleansing: Rinsed/Irrigated with Saline Topical Anesthetic Applied: None Treatment Notes Wound #5 (Medial Sacrum) 5. Secondary Dressing Applied Bordered Foam Dressing Electronic Signature(s) Signed: 05/30/2015 5:32:35 PM By: Gretta Cool, RN, BSN, Kim RN, BSN Entered By: Gretta Cool, RN, BSN, Kim on 05/30/2015 15:50:21 Vandam, Remingtyn H. ([id number]) -------------------------------------------------------------------------------- Wound Assessment Details Patient Name: Sobczak, Aara H.  Date of Service: 05/30/2015 3:15 PM Medical Record Number: [id number] Patient Account Number: [id number] Date of Birth/Sex: [birth year] (69 y.o. Female) Treating RN: Cornell Barman Primary Care Physician: Paulita Cradle Other Clinician: Referring Physician: Paulita Cradle Treating Physician/Extender: Frann Rider in Treatment: 11 Wound Status Wound Number: 6 Primary Etiology: Pressure Ulcer Wound Location: Left Ilium Wound  Status: Open Wounding Event: Pressure Injury Date Acquired: 05/30/2015 Weeks Of Treatment: 0 Clustered Wound: No Photos Photo Uploaded By: Gretta Cool, RN, BSN, Kim on 05/30/2015 17:14:01 Wound Measurements Length: (cm) 0.3 % Reduction Width: (cm) 0.2 % Reduction Depth: (cm) 0.1 Area: (cm) 0.047 Volume: (cm) 0.005 in Area: in Volume: Periwound Skin Texture Texture Color No Abnormalities Noted: No No Abnormalities Noted: No Moisture No Abnormalities Noted: No Treatment Notes Wound #6 (Left Ilium) 5. Secondary Dressing Applied Bordered Foam Dressing Electronic Signature(s) VENKATESH, Tanielle H. ([id number]) Signed: 05/30/2015 5:32:35 PM By: Gretta Cool RN, BSN, Kim RN, BSN Entered By: Gretta Cool, RN, BSN, Kim on 05/30/2015 15:52:14 Rowlette, Kaislee H. ([id number]) -------------------------------------------------------------------------------- Vitals Details Patient Name: [name], Renika H. Date of Service: 05/30/2015 3:15 PM Medical Record Number: [id number] Patient Account Number: [id number] Date of Birth/Sex: 10/17/1945 (69 y.o. Female) Treating RN: Cornell Barman Primary Care Physician: Paulita Cradle Other Clinician: Referring Physician: Paulita Cradle Treating Physician/Extender: Frann Rider in Treatment: 11 Vital Signs Time Taken: 15:26 Temperature (F): 98.4 Height (in): 65 Pulse (bpm): 118 Weight (lbs): 118 Respiratory Rate (breaths/min): 18 Body Mass Index (BMI): 19.6 Blood Pressure (mmHg): 114/66 Reference Range: 80 - 120 mg / dl Electronic Signature(s) Signed: 05/30/2015 5:32:35 PM By: Gretta Cool, RN, BSN, Kim RN, BSN Entered By: Gretta Cool, RN, BSN, Kim on 05/30/2015 15:30:30

## 2015-06-06 ENCOUNTER — Encounter: Payer: Medicare Other | Admitting: Surgery

## 2015-06-06 DIAGNOSIS — L89613 Pressure ulcer of right heel, stage 3: Secondary | ICD-10-CM | POA: Diagnosis not present

## 2015-06-07 NOTE — Progress Notes (Signed)
[name], [name] ([id number]) Visit Report for 06/06/2015 Arrival Information Details Patient Name: [name], [name] Date of Service: 06/06/2015 1:15 PM Medical Record Number: [id number] Patient Account Number: [id number] Date of Birth/Sex: Jan 29, 1946 (69 y.o. Female) Treating RN: Afful, RN, BSN, Velva Harman Primary Care Physician: Paulita Cradle Other Clinician: Referring Physician: Paulita Cradle Treating Physician/Extender: Frann Rider in Treatment: 12 Visit Information History Since Last Visit Any new allergies or adverse reactions: No Patient Arrived: Wheel Chair Had a fall or experienced change in No activities of daily living that may affect Arrival Time: 13:19 risk of falls: Accompanied By: spouse Signs or symptoms of abuse/neglect since last No Transfer Assistance: None visito Patient Identification Verified: Yes Hospitalized since last visit: No Secondary Verification Process Yes Has Dressing in Place as Prescribed: Yes Completed: Pain Present Now: No Patient Requires Transmission-Based No Precautions: Patient Has Alerts: No Electronic Signature(s) Signed: 06/06/2015 4:21:14 PM By: Regan Lemming BSN, RN Entered By: Regan Lemming on 06/06/2015 13:20:03 [name], [place]. ([id number]) -------------------------------------------------------------------------------- Clinic Level of Care Assessment Details Patient Name: Pflum, [name] Date of Service: 06/06/2015 1:15 PM Medical Record Number: [id number] Patient Account Number: [id number] Date of Birth/Sex: Aug 12, 1946 (69 y.o. Female) Treating RN: Cornell Barman Primary Care Physician: Paulita Cradle Other Clinician: Referring Physician: Paulita Cradle Treating Physician/Extender: Frann Rider in Treatment: 12 Clinic Level of Care Assessment Items TOOL 4 Quantity Score []  - Use when only an EandM is performed on FOLLOW-UP visit 0 ASSESSMENTS - Nursing Assessment / Reassessment []  - Reassessment of  Co-morbidities (includes updates in patient status) 0 X - Reassessment of Adherence to Treatment Plan 1 5 ASSESSMENTS - Wound and Skin Assessment / Reassessment []  - Simple Wound Assessment / Reassessment - one wound 0 X - Complex Wound Assessment / Reassessment - multiple wounds 4 5 []  - Dermatologic / Skin Assessment (not related to wound area) 0 ASSESSMENTS - Focused Assessment []  - Circumferential Edema Measurements - multi extremities 0 []  - Nutritional Assessment / Counseling / Intervention 0 []  - Lower Extremity Assessment (monofilament, tuning fork, pulses) 0 []  - Peripheral Arterial Disease Assessment (using hand held doppler) 0 ASSESSMENTS - Ostomy and/or Continence Assessment and Care []  - Incontinence Assessment and Management 0 []  - Ostomy Care Assessment and Management (repouching, etc.) 0 PROCESS - Coordination of Care X - Simple Patient / Family Education for ongoing care 1 15 []  - Complex (extensive) Patient / Family Education for ongoing care 0 X - Staff obtains Programmer, systems, Records, Test Results / Process Orders 1 10 []  - Staff telephones HHA, Nursing Homes / Clarify orders / etc 0 []  - Routine Transfer to another Facility (non-emergent condition) 0 [name], [name] ([id number]) []  - Routine Hospital Admission (non-emergent condition) 0 []  - New Admissions / Biomedical engineer / Ordering NPWT, Apligraf, etc. 0 []  - Emergency Hospital Admission (emergent condition) 0 X - Simple Discharge Coordination 1 10 []  - Complex (extensive) Discharge Coordination 0 PROCESS - Special Needs []  - Pediatric / Minor Patient Management 0 []  - Isolation Patient Management 0 []  - Hearing / Language / Visual special needs 0 []  - Assessment of Community assistance (transportation, D/C planning, etc.) 0 []  - Additional assistance / Altered mentation 0 []  - Support Surface(s) Assessment (bed, cushion, seat, etc.) 0 INTERVENTIONS - Wound Cleansing / Measurement []  - Simple Wound  Cleansing - one wound 0 X - Complex Wound Cleansing - multiple wounds 4 5 []  - Wound Imaging (photographs - any number of wounds) 0 X - Wound Tracing (instead  of photographs) 1 5 []  - Simple Wound Measurement - one wound 0 X - Complex Wound Measurement - multiple wounds 4 5 INTERVENTIONS - Wound Dressings []  - Small Wound Dressing one or multiple wounds 0 X - Medium Wound Dressing one or multiple wounds 4 15 []  - Large Wound Dressing one or multiple wounds 0 []  - Application of Medications - topical 0 []  - Application of Medications - injection 0 INTERVENTIONS - Miscellaneous []  - External ear exam 0 [name], [name] ([id number]) []  - Specimen Collection (cultures, biopsies, blood, body fluids, etc.) 0 []  - Specimen(s) / Culture(s) sent or taken to Lab for analysis 0 []  - Patient Transfer (multiple staff / Civil Service fast streamer / Similar devices) 0 []  - Simple Staple / Suture removal (25 or less) 0 []  - Complex Staple / Suture removal (26 or more) 0 []  - Hypo / Hyperglycemic Management (close monitor of Blood Glucose) 0 []  - Ankle / Brachial Index (ABI) - do not check if billed separately 0 X - Vital Signs 1 5 Has the patient been seen at the hospital within the last three years: Yes Total Score: 170 Level Of Care: New/Established - Level 5 Electronic Signature(s) Signed: 06/06/2015 5:14:03 PM By: Gretta Cool, RN, BSN, Kim RN, BSN Entered By: Gretta Cool, RN, BSN, Kim on 06/06/2015 13:54:11 [name], [name] ([id number]) -------------------------------------------------------------------------------- Encounter Discharge Information Details Patient Name: [name], [name] Date of Service: 06/06/2015 1:15 PM Medical Record Number: [id number] Patient Account Number: [id number] Date of Birth/Sex: [birth year] (69 y.o. Female) Treating RN: Afful, RN, BSN, Velva Harman Primary Care Physician: Paulita Cradle Other Clinician: Referring Physician: Paulita Cradle Treating Physician/Extender: Frann Rider in  Treatment: 12 Encounter Discharge Information Items Discharge Pain Level: 0 Discharge Condition: Stable Ambulatory Status: Wheelchair Discharge Destination: Home Transportation: Private Auto Accompanied By: spouse Schedule Follow-up Appointment: No Medication Reconciliation completed and provided to Patient/Care No Devone Bonilla: Provided on Clinical Summary of Care: 06/06/2015 Form Type Recipient Paper Patient AS Electronic Signature(s) Signed: 06/06/2015 2:04:35 PM By: Ruthine Dose Entered By: Ruthine Dose on 06/06/2015 14:04:35 [name], [name] ([id number]) -------------------------------------------------------------------------------- Lower Extremity Assessment Details Patient Name: [name], [name] Date of Service: 06/06/2015 1:15 PM Medical Record Number: [id number] Patient Account Number: [id number] Date of Birth/Sex: Nov 07, 1945 (69 y.o. Female) Treating RN: Afful, RN, BSN, Velva Harman Primary Care Physician: Paulita Cradle Other Clinician: Referring Physician: Paulita Cradle Treating Physician/Extender: Frann Rider in Treatment: 12 Vascular Assessment Pulses: Posterior Tibial Dorsalis Pedis Palpable: [Right:Yes] Extremity colors, hair growth, and conditions: Extremity Color: [Right:Normal] Hair Growth on Extremity: [Right:No] Temperature of Extremity: [Right:Warm] Capillary Refill: [Right:< 3 seconds] Toe Nail Assessment Left: Right: Thick: Yes Discolored: Yes Deformed: No Improper Length and Hygiene: No Electronic Signature(s) Signed: 06/06/2015 4:21:14 PM By: Regan Lemming BSN, RN Entered By: Regan Lemming on 06/06/2015 13:34:12 Kever, Kaleesi H. ([id number]) -------------------------------------------------------------------------------- Multi Wound Chart Details Patient Name: [name], [name] Date of Service: 06/06/2015 1:15 PM Medical Record Number: [id number] Patient Account Number: [id number] Date of Birth/Sex: 02/15/1946 (69 y.o. 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003] [3:0.454] [4:0.132] % Reduction in Area: [2:98.40%] [3:-1963.60%] [4:63.50%] % Reduction in Volume: 99.20% [3:-1963.60%] [4:63.40%] Classification: [2:Category/Stage III] [3:Category/Stage II] [4:Category/Stage III] HBO Classification: [2:N/A] [3:N/A] [4:Grade 1] Exudate Amount: [2:None Present] [3:Small] [4:Small] Exudate Type: [2:N/A] [3:Serosanguineous] [4:Serous] Exudate Color: [2:N/A] [3:red, brown] [4:amber] Wound Margin: [2:Distinct, outline attached] [3:Distinct, outline attached] [4:Epibole] Granulation Amount: [2:None Present (0%)] [3:None Present (0%)] [4:Small (1-33%)] Granulation Quality: [2:N/A] [3:N/A] [4:Pink, Pale] Necrotic Amount: [2:Small (1-33%)] [3:Large (67-100%)] [4:Medium (34-66%)] Exposed Structures: [2:Fascia: No Fat: No Tendon: No Muscle: No Joint: No Bone: No Limited to Skin Breakdown] [3:Fascia: No Fat: No Tendon: No Muscle: No Joint: No Bone: No Limited to  Skin Breakdown] [4:Fascia: No Fat: No Tendon: No Muscle: No Joint: No Bone: No Limited to  Skin Breakdown] Epithelialization: [2:N/A] [3:None] [4:None] Periwound Skin Texture: Edema: No [2:Excoriation: No Induration: No] [3:Edema: No Excoriation: No Induration: No] [4:Edema: No Excoriation: No Induration: No] Callus: No Callus: No Callus: No Crepitus: No Crepitus: No Crepitus: No Fluctuance: No Fluctuance: No Fluctuance: No Friable: No Friable: No Friable: No Rash: No Rash: No Rash: No Scarring: No Scarring: No Scarring: No Periwound Skin Dry/Scaly: Yes Moist: Yes Moist: Yes Moisture: Maceration: No Maceration: No Maceration: No Moist: No Dry/Scaly: No Dry/Scaly: No Periwound Skin Color: Atrophie Blanche: No Atrophie Blanche: No Atrophie Blanche: No Cyanosis: No Cyanosis: No Cyanosis: No Ecchymosis: No Ecchymosis: No Ecchymosis: No Erythema: No Erythema: No Erythema: No Hemosiderin Staining: No Hemosiderin Staining: No Hemosiderin Staining: No Mottled: No Mottled: No Mottled: No Pallor: No Pallor: No Pallor: No Rubor: No Rubor: No Rubor: No Temperature: No Abnormality No Abnormality No Abnormality Tenderness on No No No Palpation: Wound Preparation: Ulcer Cleansing: Ulcer Cleansing: Ulcer Cleansing: Rinsed/Irrigated with Rinsed/Irrigated with Rinsed/Irrigated with Saline Saline Saline Topical Anesthetic Topical Anesthetic Topical Anesthetic Applied: Other: lidocaine Applied: Other: lidocaine Applied: Other: lidocaine 4% 4% 4% Wound Number: 5 6 N/A Photos: No Photos No Photos N/A Wound Location: Sacrum - Medial Left Ilium N/A Wounding Event: Pressure Injury Pressure Injury N/A Primary Etiology: Pressure Ulcer Pressure Ulcer N/A Comorbid History: Cataracts, Asthma, Cataracts, Asthma, N/A Hypertension, Type II Hypertension, Type II Diabetes, Neuropathy, Diabetes, Neuropathy, Received Chemotherapy Received Chemotherapy Date Acquired: 05/19/2015  05/30/2015 N/A Weeks of Treatment: 1 1 N/A Wound Status: Open Healed - Epithelialized N/A Measurements L x W x D 0.5x0.4x0.1 0x0x0 N/A (cm) Area (cm) : 0.157 0 N/A Volume (cm) : 0.016 0 N/A % Reduction in Area: 73.30% 100.00% N/A % Reduction in Volume: 72.90% 100.00% N/A Classification: Category/Stage II Unstageable/Unclassified N/A HBO Classification: N/A N/A N/A Exudate Amount: Small None Present N/A Exudate Type: Serous N/A N/A Crandell, Kyleah H. ([id number]) Exudate Color: amber N/A N/A Wound Margin: Flat and Intact Distinct, outline attached N/A Granulation Amount: Small (1-33%) None Present (0%) N/A Granulation Quality: Pink N/A N/A Necrotic Amount: Small (1-33%) None Present (0%) N/A Exposed Structures: Fascia: No Fascia: No N/A Fat: No Fat: No Tendon: No Tendon: No Muscle: No Muscle: No Joint: No Joint: No Bone: No Bone: No Limited to Skin Limited to Skin Breakdown Breakdown Epithelialization: None Large (67-100%) N/A Periwound Skin Texture: Edema: No Edema: No N/A Excoriation: No Excoriation: No Induration: No Induration: No Callus: No Callus: No Crepitus: No Crepitus: No Fluctuance: No Fluctuance: No Friable: No Friable: No Rash: No Rash: No Scarring: No Scarring:  No Periwound Skin Moist: Yes Dry/Scaly: Yes N/A Moisture: Maceration: No Maceration: No Dry/Scaly: No Moist: No Periwound Skin Color: Atrophie Blanche: No Atrophie Blanche: No N/A Cyanosis: No Cyanosis: No Ecchymosis: No Ecchymosis: No Erythema: No Erythema: No Hemosiderin Staining: No Hemosiderin Staining: No Mottled: No Mottled: No Pallor: No Pallor: No Rubor: No Rubor: No Temperature: No Abnormality N/A N/A Tenderness on No No N/A Palpation: Wound Preparation: Ulcer Cleansing: Ulcer Cleansing: Not N/A Rinsed/Irrigated with Cleansed Saline Topical Anesthetic Applied: Other: lidocaine 4% Treatment Notes Electronic Signature(s) Signed: 06/06/2015 5:14:03 PM By:  Gretta Cool, RN, BSN, Kim RN, BSN Girtman, Gunhild HMarland Kitchen ([id number]) Entered By: Gretta Cool, RN, BSN, Kim on 06/06/2015 13:48:31 Tripp, Valarie HMarland Kitchen ([id number]) -------------------------------------------------------------------------------- [place] Details Patient Name: [name], [name] Date of Service: 06/06/2015 1:15 PM Medical Record Number: [id number] Patient Account Number: [id number] Date of Birth/Sex: 1946/03/25 (69 y.o. Female) Treating RN: Cornell Barman Primary Care Physician: Paulita Cradle Other Clinician: Referring Physician: Paulita Cradle Treating Physician/Extender: Frann Rider in Treatment: 12 Active Inactive Abuse / Safety / Falls / Self Care Management Nursing Diagnoses: Impaired home maintenance Impaired physical mobility Knowledge deficit related to: safety; personal, health (wound), emergency Potential for falls Self care deficit: actual or potential Goals: Patient will remain injury free Date Initiated: 03/13/2015 Goal Status: Active Patient/caregiver will verbalize understanding of skin care regimen Date Initiated: 03/13/2015 Goal Status: Active Patient/caregiver will verbalize/demonstrate measure taken to improve self care Date Initiated: 03/13/2015 Goal Status: Active Patient/caregiver will verbalize/demonstrate measures taken to improve the patient's personal safety Date Initiated: 03/13/2015 Goal Status: Active Patient/caregiver will verbalize/demonstrate measures taken to prevent injury and/or falls Date Initiated: 03/13/2015 Goal Status: Active Patient/caregiver will verbalize/demonstrate understanding of what to do in case of emergency Date Initiated: 03/13/2015 Goal Status: Active Interventions: Assess fall risk on admission and as needed Assess: immobility, friction, shearing, incontinence upon admission and as needed Assess impairment of mobility on admission and as needed per policy Assess self care needs on admission and as  needed Provide education on basic hygiene [place], [place]. ([id number]) Provide education on fall prevention Provide education on personal and home safety Provide education on safe transfers Treatment Activities: Education provided on Basic Hygiene : 03/13/2015 Notes: Orientation to the Wound Care Program Nursing Diagnoses: Knowledge deficit related to the wound healing center program Goals: Patient/caregiver will verbalize understanding of the [place] Date Initiated: 03/13/2015 Goal Status: Active Interventions: Provide education on orientation to the wound center Notes: Pressure Nursing Diagnoses: Knowledge deficit related to causes and risk factors for pressure ulcer development Knowledge deficit related to management of pressures ulcers Potential for impaired tissue integrity related to pressure, friction, moisture, and shear Goals: Patient will remain free from development of additional pressure ulcers Date Initiated: 03/13/2015 Goal Status: Active Patient will remain free of pressure ulcers Date Initiated: 03/13/2015 Goal Status: Active Patient/caregiver will verbalize risk factors for pressure ulcer development Date Initiated: 03/13/2015 Goal Status: Active Patient/caregiver will verbalize understanding of pressure ulcer management Date Initiated: 03/13/2015 Goal Status: Active Interventions: Assess: immobility, friction, shearing, incontinence upon admission and as needed [name], [name] ([id number]) Assess offloading mechanisms upon admission and as needed Assess potential for pressure ulcer upon admission and as needed Provide education on pressure ulcers Treatment Activities: Patient referred for home evaluation of offloading devices/mattresses : 06/06/2015 Patient referred for pressure reduction/relief devices : 06/06/2015 Patient referred for seating evaluation to ensure proper offloading : 06/06/2015 Pressure reduction/relief device ordered :  06/06/2015 Test ordered outside of clinic : 06/06/2015 Notes:  Wound/Skin Impairment Nursing Diagnoses: Impaired tissue integrity Knowledge deficit related to ulceration/compromised skin integrity Goals: Patient/caregiver will verbalize understanding of skin care regimen Date Initiated: 03/13/2015 Goal Status: Active Ulcer/skin breakdown will have a volume reduction of 30% by week 4 Date Initiated: 03/13/2015 Goal Status: Active Ulcer/skin breakdown will have a volume reduction of 50% by week 8 Date Initiated: 03/13/2015 Goal Status: Active Ulcer/skin breakdown will have a volume reduction of 80% by week 12 Date Initiated: 03/13/2015 Goal Status: Active Ulcer/skin breakdown will heal within 14 weeks Date Initiated: 03/13/2015 Goal Status: Active Interventions: Assess patient/caregiver ability to obtain necessary supplies Assess patient/caregiver ability to perform ulcer/skin care regimen upon admission and as needed Assess ulceration(s) every visit Provide education on smoking Provide education on ulcer and skin care Treatment Activities: Patient referred to home care : 06/06/2015 TREVINO, Allura Lemmie Evens ([id number]) Referred to DME Leiland Mihelich for dressing supplies : 06/06/2015 Skin care regimen initiated : 06/06/2015 Topical wound management initiated : 06/06/2015 Notes: Electronic Signature(s) Signed: 06/06/2015 5:14:03 PM By: Gretta Cool, RN, BSN, Kim RN, BSN Entered By: Gretta Cool, RN, BSN, Kim on 06/06/2015 13:47:52 Weitz, Loral H. ([id number]) -------------------------------------------------------------------------------- Pain Assessment Details Patient Name: Treptow, Destiney H. Date of Service: 06/06/2015 1:15 PM Medical Record Number: [id number] Patient Account Number: [id number] Date of Birth/Sex: Jan 30, 1946 (69 y.o. Female) Treating RN: Baruch Gouty, RN, BSN, Velva Harman Primary Care Physician: Paulita Cradle Other Clinician: Referring Physician: Paulita Cradle Treating Physician/Extender: Frann Rider in Treatment: 12 Active Problems Location of Pain Severity and Description of Pain Patient Has Paino No Site Locations Pain Management and Medication Current Pain Management: Electronic Signature(s) Signed: 06/06/2015 4:21:14 PM By: Regan Lemming BSN, RN Entered By: Regan Lemming on 06/06/2015 13:20:08 Murrillo, Toneisha H. ([id number]) -------------------------------------------------------------------------------- Patient/Caregiver Education Details Patient Name: [name], Berneta H. Date of Service: 06/06/2015 1:15 PM Medical Record Number: [id number] Patient Account Number: [id number] Date of Birth/Gender: 1946/07/06 (69 y.o. Female) Treating RN: Afful, RN, BSN, Velva Harman Primary Care Physician: Paulita Cradle Other Clinician: Referring Physician: Paulita Cradle Treating Physician/Extender: Frann Rider in Treatment: 12 Education Assessment Education Provided To: Patient Education Topics Provided Basic Hygiene: Methods: Explain/Verbal Responses: State content correctly Pressure: Methods: Explain/Verbal Responses: State content correctly Safety: Methods: Explain/Verbal Responses: State content correctly Smoking and Wound Healing: Methods: Explain/Verbal Responses: State content correctly Welcome To [facility]: Methods: Explain/Verbal Responses: State content correctly Wound/Skin Impairment: Methods: Explain/Verbal Responses: State content correctly Electronic Signature(s) Signed: 06/06/2015 4:21:14 PM By: Regan Lemming BSN, RN Entered By: Regan Lemming on 06/06/2015 14:01:52 Roessner, Yarelin H. ([id number]) -------------------------------------------------------------------------------- Wound Assessment Details Patient Name: Lawson, Shantrice H. Date of Service: 06/06/2015 1:15 PM Medical Record Number: [id number] Patient Account Number: [id number] Date of Birth/Sex: Jan 15, 1946 (69 y.o. Female) Treating RN: Afful, RN, BSN, [place] Primary Care Physician:  Paulita Cradle Other Clinician: Referring Physician: Paulita Cradle Treating Physician/Extender: Frann Rider in Treatment: 12 Wound Status Wound Number: 2 Primary Pressure Ulcer Etiology: Wound Location: Right Elbow Wound Open Wounding Event: Gradually Appeared Status: Date Acquired: 02/25/2015 Comorbid Cataracts, Asthma, Hypertension, Type Weeks Of Treatment: 12 History: II Diabetes, Neuropathy, Received Clustered Wound: No Chemotherapy Photos Photo Uploaded By: Regan Lemming on 06/06/2015 16:18:05 Wound Measurements Length: (cm) 0.2 Width: (cm) 0.2 Depth: (cm) 0.1 Area: (cm) 0.031 Volume: (cm) 0.003 % Reduction in Area: 98.4% % Reduction in Volume: 99.2% Tunneling: No Undermining: No Wound Description Classification: Category/Stage III Wound Margin: Distinct, outline attached Exudate Amount: None Present Foul Odor After Cleansing: No Wound Bed Granulation Amount: None Present (0%) Exposed Structure Necrotic Amount: Small (  1-33%) Fascia Exposed: No Necrotic Quality: Adherent Slough Fat Layer Exposed: No Tendon Exposed: No Muscle Exposed: No Joint Exposed: No Kyler, Anastaisa H. ([id number]) Bone Exposed: No Limited to Skin Breakdown Periwound Skin Texture Texture Color No Abnormalities Noted: No No Abnormalities Noted: No Callus: No Atrophie Blanche: No Crepitus: No Cyanosis: No Excoriation: No Ecchymosis: No Fluctuance: No Erythema: No Friable: No Hemosiderin Staining: No Induration: No Mottled: No Localized Edema: No Pallor: No Rash: No Rubor: No Scarring: No Temperature / Pain Moisture Temperature: No Abnormality No Abnormalities Noted: No Dry / Scaly: Yes Maceration: No Moist: No Wound Preparation Ulcer Cleansing: Rinsed/Irrigated with Saline Topical Anesthetic Applied: Other: lidocaine 4%, Treatment Notes Wound #2 (Right Elbow) 1. Cleansed with: Clean wound with Normal Saline 4. Dressing Applied: Aquacel Ag 5.  Secondary Dressing Applied Bordered Foam Dressing Electronic Signature(s) Signed: 06/06/2015 4:21:14 PM By: Regan Lemming BSN, RN Entered By: Regan Lemming on 06/06/2015 13:29:37 Mcelhiney, Trana H. ([id number]) -------------------------------------------------------------------------------- Wound Assessment Details Patient Name: Alling, Christen H. Date of Service: 06/06/2015 1:15 PM Medical Record Number: [id number] Patient Account Number: [id number] Date of Birth/Sex: April 24, 1946 (69 y.o. Female) Treating RN: Afful, RN, BSN, [place] Primary Care Physician: Paulita Cradle Other Clinician: Referring Physician: Paulita Cradle Treating Physician/Extender: Frann Rider in Treatment: 12 Wound Status Wound Number: 3 Primary Pressure Ulcer Etiology: Wound Location: Back - Midline Wound Open Wounding Event: Pressure Injury Status: Date Acquired: 05/02/2015 Comorbid Cataracts, Asthma, Hypertension, Type Weeks Of Treatment: 5 History: II Diabetes, Neuropathy, Received Clustered Wound: No Chemotherapy Photos Photo Uploaded By: Regan Lemming on 06/06/2015 16:18:16 Wound Measurements Length: (cm) 3.4 Width: (cm) 1.7 Depth: (cm) 0.1 Area: (cm) 4.54 Volume: (cm) 0.454 % Reduction in Area: -1963.6% % Reduction in Volume: -1963.6% Epithelialization: None Tunneling: No Undermining: No Wound Description Classification: Category/Stage II Wound Margin: Distinct, outline attached Exudate Amount: Small Exudate Type: Serosanguineous Exudate Color: red, brown Foul Odor After Cleansing: No Wound Bed Granulation Amount: None Present (0%) Exposed Structure Necrotic Amount: Large (67-100%) Fascia Exposed: No Necrotic Quality: Adherent Slough Fat Layer Exposed: No Tendon Exposed: No Terrones, Ginna H. ([id number]) Muscle Exposed: No Joint Exposed: No Bone Exposed: No Limited to Skin Breakdown Periwound Skin Texture Texture Color No Abnormalities Noted: No No Abnormalities Noted:  No Callus: No Atrophie Blanche: No Crepitus: No Cyanosis: No Excoriation: No Ecchymosis: No Fluctuance: No Erythema: No Friable: No Hemosiderin Staining: No Induration: No Mottled: No Localized Edema: No Pallor: No Rash: No Rubor: No Scarring: No Temperature / Pain Moisture Temperature: No Abnormality No Abnormalities Noted: No Dry / Scaly: No Maceration: No Moist: Yes Wound Preparation Ulcer Cleansing: Rinsed/Irrigated with Saline Topical Anesthetic Applied: Other: lidocaine 4%, Treatment Notes Wound #3 (Midline Back) 1. Cleansed with: Clean wound with Normal Saline 4. Dressing Applied: Santyl Ointment 5. Secondary Dressing Applied Bordered Foam Dressing Electronic Signature(s) Signed: 06/06/2015 4:21:14 PM By: Regan Lemming BSN, RN Entered By: Regan Lemming on 06/06/2015 13:31:22 Mcleish, Antoniette H. ([id number]) -------------------------------------------------------------------------------- Wound Assessment Details Patient Name: Stanbrough, Molley H. Date of Service: 06/06/2015 1:15 PM Medical Record Number: [id number] Patient Account Number: [id number] Date of Birth/Sex: Dec 08, 1945 (69 y.o. Female) Treating RN: Afful, RN, BSN, Velva Harman Primary Care Physician: Paulita Cradle Other Clinician: Referring Physician: Paulita Cradle Treating Physician/Extender: Frann Rider in Treatment: 12 Wound Status Wound Number: 4 Primary Pressure Ulcer Etiology: Wound Location: Right Calcaneous Wound Open Wounding Event: Pressure Injury Status: Date Acquired: 05/11/2015 Comorbid Cataracts, Asthma, Hypertension, Type Weeks Of Treatment: 3 History: II Diabetes, Neuropathy, Received Clustered Wound: No Chemotherapy Photos Photo  Uploaded By: Regan Lemming on 06/06/2015 16:18:36 Wound Measurements Length: (cm) 1.4 Width: (cm) 1.2 Depth: (cm) 0.1 Area: (cm) 1.319 Volume: (cm) 0.132 % Reduction in Area: 63.5% % Reduction in Volume: 63.4% Epithelialization:  None Tunneling: No Undermining: No Wound Description Classification: Category/Stage III Foul Odor A Diabetic Severity (Wagner): Grade 1 Wound Margin: Epibole Exudate Amount: Small Exudate Type: Serous Exudate Color: amber fter Cleansing: No Wound Bed Granulation Amount: Small (1-33%) Exposed Structure Granulation Quality: Pink, Pale Fascia Exposed: No Necrotic Amount: Medium (34-66%) Fat Layer Exposed: No Lamarque, Nekita H. ([id number]) Necrotic Quality: Adherent Slough Tendon Exposed: No Muscle Exposed: No Joint Exposed: No Bone Exposed: No Limited to Skin Breakdown Periwound Skin Texture Texture Color No Abnormalities Noted: No No Abnormalities Noted: No Callus: No Atrophie Blanche: No Crepitus: No Cyanosis: No Excoriation: No Ecchymosis: No Fluctuance: No Erythema: No Friable: No Hemosiderin Staining: No Induration: No Mottled: No Localized Edema: No Pallor: No Rash: No Rubor: No Scarring: No Temperature / Pain Moisture Temperature: No Abnormality No Abnormalities Noted: No Dry / Scaly: No Maceration: No Moist: Yes Wound Preparation Ulcer Cleansing: Rinsed/Irrigated with Saline Topical Anesthetic Applied: Other: lidocaine 4%, Treatment Notes Wound #4 (Right Calcaneous) 1. Cleansed with: Clean wound with Normal Saline 4. Dressing Applied: Aquacel Ag 5. Secondary Dressing Applied Bordered Foam Dressing Electronic Signature(s) Signed: 06/06/2015 4:21:14 PM By: Regan Lemming BSN, RN Entered By: Regan Lemming on 06/06/2015 13:32:26 Forness, Monetta H. ([id number]) -------------------------------------------------------------------------------- Wound Assessment Details Patient Name: Holben, Areeba H. Date of Service: 06/06/2015 1:15 PM Medical Record Number: [id number] Patient Account Number: [id number] Date of Birth/Sex: 04/11/1946 (69 y.o. Female) Treating RN: Afful, RN, BSN, [place] Primary Care Physician: Paulita Cradle Other Clinician: Referring Physician:  Paulita Cradle Treating Physician/Extender: Frann Rider in Treatment: 12 Wound Status Wound Number: 5 Primary Pressure Ulcer Etiology: Wound Location: Sacrum - Medial Wound Open Wounding Event: Pressure Injury Status: Date Acquired: 05/19/2015 Comorbid Cataracts, Asthma, Hypertension, Type Weeks Of Treatment: 1 History: II Diabetes, Neuropathy, Received Clustered Wound: No Chemotherapy Photos Photo Uploaded By: Regan Lemming on 06/06/2015 16:18:54 Wound Measurements Length: (cm) 0.5 Width: (cm) 0.4 Depth: (cm) 0.1 Area: (cm) 0.157 Volume: (cm) 0.016 % Reduction in Area: 73.3% % Reduction in Volume: 72.9% Epithelialization: None Tunneling: No Undermining: No Wound Description Classification: Category/Stage II Wound Margin: Flat and Intact Exudate Amount: Small Exudate Type: Serous Exudate Color: amber Wound Bed Granulation Amount: Small (1-33%) Exposed Structure Granulation Quality: Pink Fascia Exposed: No Necrotic Amount: Small (1-33%) Fat Layer Exposed: No Necrotic Quality: Adherent Slough Tendon Exposed: No [place], Nohelia H. ([id number]) Muscle Exposed: No Joint Exposed: No Bone Exposed: No Limited to Skin Breakdown Periwound Skin Texture Texture Color No Abnormalities Noted: No No Abnormalities Noted: No Callus: No Atrophie Blanche: No Crepitus: No Cyanosis: No Excoriation: No Ecchymosis: No Fluctuance: No Erythema: No Friable: No Hemosiderin Staining: No Induration: No Mottled: No Localized Edema: No Pallor: No Rash: No Rubor: No Scarring: No Temperature / Pain Moisture Temperature: No Abnormality No Abnormalities Noted: No Dry / Scaly: No Maceration: No Moist: Yes Wound Preparation Ulcer Cleansing: Rinsed/Irrigated with Saline Topical Anesthetic Applied: Other: lidocaine 4%, Treatment Notes Wound #5 (Medial Sacrum) 1. Cleansed with: Clean wound with Normal Saline 4. Dressing Applied: Aquacel Ag 5. Secondary Dressing  Applied Bordered Foam Dressing Electronic Signature(s) Signed: 06/06/2015 4:21:14 PM By: Regan Lemming BSN, RN Entered By: Regan Lemming on 06/06/2015 13:32:53 Gatchalian, Lynita H. ([id number]) -------------------------------------------------------------------------------- Wound Assessment Details Patient Name: Borntreger, Ival H. Date of Service: 06/06/2015 1:15 PM Medical Record Number:  [id number] Patient Account Number: [id number] Date of Birth/Sex: 23-Jan-1946 (69 y.o. Female) Treating RN: Afful, RN, BSN, Velva Harman Primary Care Physician: Paulita Cradle Other Clinician: Referring Physician: Paulita Cradle Treating Physician/Extender: Frann Rider in Treatment: 12 Wound Status Wound Number: 6 Primary Pressure Ulcer Etiology: Wound Location: Left Ilium Wound Healed - Epithelialized Wounding Event: Pressure Injury Status: Date Acquired: 05/30/2015 Comorbid Cataracts, Asthma, Hypertension, Type Weeks Of Treatment: 1 History: II Diabetes, Neuropathy, Received Clustered Wound: No Chemotherapy Photos Photo Uploaded By: Regan Lemming on 06/06/2015 16:20:09 Wound Measurements Length: (cm) 0 % Reductio Width: (cm) 0 % Reductio Depth: (cm) 0 Epithelial Area: (cm) 0 Tunneling Volume: (cm) 0 Undermini n in Area: 100% n in Volume: 100% ization: Large (67-100%) : No ng: No Wound Description Classification: Unstageable/Unclassified Wound Margin: Distinct, outline attached Exudate Amount: None Present Foul Odor After Cleansing: No Wound Bed Granulation Amount: None Present (0%) Exposed Structure Necrotic Amount: None Present (0%) Fascia Exposed: No Fat Layer Exposed: No Tendon Exposed: No Muscle Exposed: No Joint Exposed: No [place], [place] H. ([id number]) Bone Exposed: No Limited to Skin Breakdown Periwound Skin Texture Texture Color No Abnormalities Noted: No No Abnormalities Noted: No Callus: No Atrophie Blanche: No Crepitus: No Cyanosis: No Excoriation: No Ecchymosis:  No Fluctuance: No Erythema: No Friable: No Hemosiderin Staining: No Induration: No Mottled: No Localized Edema: No Pallor: No Rash: No Rubor: No Scarring: No Moisture No Abnormalities Noted: No Dry / Scaly: Yes Maceration: No Moist: No Wound Preparation Ulcer Cleansing: Not Cleansed Electronic Signature(s) Signed: 06/06/2015 4:21:14 PM By: Regan Lemming BSN, RN Entered By: Regan Lemming on 06/06/2015 13:33:38 Gottwald, Adelise H. ([id number]) -------------------------------------------------------------------------------- Vitals Details Patient Name: [name], Kashina H. Date of Service: 06/06/2015 1:15 PM Medical Record Number: [id number] Patient Account Number: [id number] Date of Birth/Sex: [birth year] (69 y.o. Female) Treating RN: Afful, RN, BSN, Velva Harman Primary Care Physician: Paulita Cradle Other Clinician: Referring Physician: Paulita Cradle Treating Physician/Extender: Frann Rider in Treatment: 12 Vital Signs Time Taken: 13:20 Reference Range: 80 - 120 mg / dl Height (in): 65 Weight (lbs): 118 Body Mass Index (BMI): 19.6 Electronic Signature(s) Signed: 06/06/2015 4:21:14 PM By: Regan Lemming BSN, RN Entered By: Regan Lemming on 06/06/2015 13:21:24

## 2015-06-07 NOTE — Progress Notes (Signed)
BARTELT, Evagelia H. ([id number]) Visit Report for 06/06/2015 Chief Complaint Document Details Patient Name: BIRCHMEIER, Johnice H. Date of Service: 06/06/2015 1:15 PM Medical Record Patient Account Number: [id number] Number: Afful, RN, BSN, Treating RN: Aug 20, 1946 (69 y.o. Velva Harman Date of Birth/Sex: Female) Other Clinician: Primary Care Physician: Carrie Mew, MIRIAM Treating Christin Fudge Referring Physician: Paulita Cradle Physician/Extender: Suella Grove in Treatment: 12 Information Obtained from: Patient Chief Complaint Patient presents to the wound care center for a consult due non healing wound. Ulcers on the right elbow and the right heel for about 1 month. Electronic Signature(s) Signed: 06/06/2015 1:57:18 PM By: Christin Fudge MD, FACS Entered By: Christin Fudge on 06/06/2015 13:57:18 Garczynski, Jasmine H. ([id number]) -------------------------------------------------------------------------------- HPI Details Patient Name: [name], Karsen H. Date of Service: 06/06/2015 1:15 PM Medical Record Patient Account Number: [id number] Number: Afful, RN, BSN, Treating RN: Apr 23, 1946 (69 y.o. Velva Harman Date of Birth/Sex: Female) Other Clinician: Primary Care Physician: Carrie Mew, MIRIAM Treating Christin Fudge Referring Physician: Paulita Cradle Physician/Extender: Weeks in Treatment: 12 History of Present Illness Location: Ulceration on the right heel and the right elbow. Quality: Patient reports experiencing a dull pain to affected area(s). Severity: Patient states wound (s) are getting better. Duration: Patient has had the wound for < 4 weeks prior to presenting for treatment Timing: Pain in wound is Intermittent (comes and goes Context: The wound appeared gradually over time Modifying Factors: Consults to this date include:Augmentin and Bactrim and also some heel protection with duoderm Associated Signs and Symptoms: Patient reports having difficulty standing for long periods. HPI  Description: 69 year old female with history of peripheral neuropathy, history of diet controlled diabetes mellitus type 2, history of alcoholism here for wound consult sent by her PCP Dr. Sherilyn Cooter. She has pressure ulcers at her right elbow, bilateral heels. Plain films of right calcaneus without acute bony process. Patient started by PCP on Augmentin, Bactrim as per orders, DuoDerm dressings applied - reports some improvement in her ulcer since last seen. Denies fever, chills, nausea, vomiting, diarrhea. She had a right humerus fracture in the middle of May and has had no surgery and arm is in a sling. She is also been laying in the bed for quite a while. Past medical history significant for essential hypertension, osteoporosis, peripheral neuropathy, alcoholism, ataxia, personal history of breast cancer treated with surgery chemotherapy and radiation and this was done in December 2010. she is also status post laparoscopic cholecystectomy, pilonidal cyst excision, subcutaneous port placement, partial mastectomy on the left side, skin cancer removal. 03/21/2015 -- she says overall she's been doing better and continues to smoke about 15 cigarettes a day. 03/21/2015 - her orthopedic doctor has said she may require surgery for her right humerus fracture. 04/04/2015 -- her orthopedic surgery has been scheduled for August 11. 04/18/2015 -- she is doing fine as far as her elbow and her right heel goes but she has developed some redness over prominence on her thoracic spine and wanted me to take a look at this. 05/02/2015 -- she had her surgery done and now is in a sling and support. Her back has developed a pressure injury of undetermined stage. She seems to be in better spirits. 05/16/2015 -- last week her right heel was looking great and we had healed it out, but she has not been offloading appropriately and has a deep tissue injury on the right heel again. The area on her right elbow has opened out  with slough and the area in the thoracic spine is also getting worse. 05/30/2015 --  she has developed 2 new ulcerations one on her left ischial tuberosity and one on the sacral region. She is still working on getting up smoking but is also unable to take her vitamins as she says she Vaden, Lynna H. ([id number]) develops a diarrhea when she takes vitamins. She has increased her intake of proteins. 06/06/2015 -- the patient's husband manages to get her a low air loss mattress with initiating pressure but did not know how to use it exactly and the patient was not happy about using it. Overall she says she's been feeling better. Electronic Signature(s) Signed: 06/06/2015 1:58:11 PM By: Christin Fudge MD, FACS Entered By: Christin Fudge on 06/06/2015 13:58:10 [place], Lutisha H. ([id number]) -------------------------------------------------------------------------------- Physical Exam Details Patient Name: Canan, Ruwayda H. Date of Service: 06/06/2015 1:15 PM Medical Record Patient Account Number: [id number] Number: Afful, RN, BSN, Treating RN: 24-Jun-1946 (69 y.o. Velva Harman Date of Birth/Sex: Female) Other Clinician: Primary Care Physician: Carrie Mew, MIRIAM Treating Christin Fudge Referring Physician: Paulita Cradle Physician/Extender: Weeks in Treatment: 12 Constitutional . Pulse regular. Respirations normal and unlabored. Afebrile. . Eyes Nonicteric. Reactive to light. Ears, Nose, Mouth, and Throat Lips, teeth, and gums WNL.Marland Kitchen Moist mucosa without lesions . Neck supple and nontender. No palpable supraclavicular or cervical adenopathy. Normal sized without goiter. Respiratory WNL. No retractions.. Cardiovascular Pedal Pulses WNL. No clubbing, cyanosis or edema. Lymphatic No adneopathy. No adenopathy. No adenopathy. Musculoskeletal Adexa without tenderness or enlargement.. Digits and nails w/o clubbing, cyanosis, infection, petechiae, ischemia, or inflammatory  conditions.. Integumentary (Hair, Skin) No suspicious lesions. No crepitus or fluctuance. No peri-wound warmth or erythema. No masses.Marland Kitchen Psychiatric Judgement and insight Intact.. No evidence of depression, anxiety, or agitation.. Notes There is no separation of the deep tissue injury on her thoracic spine. Most of the other wounds are looking better and the one on her right heel is a stage III patient injury. The left heel still remains indeterminate. Electronic Signature(s) Signed: 06/06/2015 1:59:41 PM By: Christin Fudge MD, FACS Entered By: Christin Fudge on 06/06/2015 13:59:41 Renovato, Lorelai H. ([id number]) -------------------------------------------------------------------------------- Physician Orders Details Patient Name: [name], Shannell H. Date of Service: 06/06/2015 1:15 PM Medical Record Number: [id number] Patient Account Number: [id number] Date of Birth/Sex: 17-Jan-1946 (69 y.o. Female) Treating RN: Cornell Barman Primary Care Physician: Paulita Cradle Other Clinician: Referring Physician: Paulita Cradle Treating Physician/Extender: Frann Rider in Treatment: 12 Verbal / Phone Orders: Yes Clinician: Cornell Barman Read Back and Verified: Yes Diagnosis Coding Wound Cleansing Wound #2 Right Elbow o Clean wound with Normal Saline. Wound #3 Midline Back o Clean wound with Normal Saline. Wound #4 Right Calcaneous o Clean wound with Normal Saline. Wound #5 Medial Sacrum o Clean wound with Normal Saline. Anesthetic Wound #2 Right Elbow o Topical Lidocaine 4% cream applied to wound bed prior to debridement Wound #3 Midline Back o Topical Lidocaine 4% cream applied to wound bed prior to debridement Wound #4 Right Calcaneous o Topical Lidocaine 4% cream applied to wound bed prior to debridement Wound #5 Medial Sacrum o Topical Lidocaine 4% cream applied to wound bed prior to debridement Primary Wound Dressing Wound #2 Right Elbow o Aquacel Ag Wound #4  Right Calcaneous o Aquacel Ag Wound #5 Medial Sacrum o Aquacel Ag Wound #3 Midline Back Mickle, Tami H. ([id number]) o Santyl Ointment Secondary Dressing Wound #2 Right Elbow o Boardered Foam Dressing Wound #3 Midline Back o Boardered Foam Dressing Wound #4 Right Calcaneous o Boardered Foam Dressing Wound #5 Medial Sacrum o Boardered Foam Dressing Follow-up Appointments Wound #2 Right  Elbow o Return Appointment in 1 week. Wound #3 Midline Back o Return Appointment in 1 week. Wound #4 Right Calcaneous o Return Appointment in 1 week. Wound #5 Medial Sacrum o Return Appointment in 1 week. Off-Loading Wound #3 Midline Back o Heel suspension boot to: - Sage boots o Turn and reposition every 2 hours o Mattress - patient refuses use of air mattress Wound #4 Right Calcaneous o Heel suspension boot to: - Sage boots o Turn and reposition every 2 hours o Mattress - patient refuses use of air mattress Wound #5 Medial Sacrum o Heel suspension boot to: - Sage boots o Turn and reposition every 2 hours o Mattress - patient refuses use of air mattress Home Health Wound #2 Right Elbow o [place] Visits DESANTIAGO, Cyndra H. ([id number]) o Home Health Nurse may visit PRN to address patientos wound care needs. o FACE TO FACE ENCOUNTER: MEDICARE and MEDICAID PATIENTS: I certify that this patient is under my care and that I had a face-to-face encounter that meets the physician face-to-face encounter requirements with this patient on this date. The encounter with the patient was in whole or in part for the following MEDICAL CONDITION: (primary reason for [place]) MEDICAL NECESSITY: I certify, that based on my findings, NURSING services are a medically necessary home health service. HOME BOUND STATUS: I certify that my clinical findings support that this patient is homebound (i.e., Due to illness or injury, pt requires aid  of supportive devices such as crutches, cane, wheelchairs, walkers, the use of special transportation or the assistance of another person to leave their place of residence. There is a normal inability to leave the home and doing so requires considerable and taxing effort. Other absences are for medical reasons / religious services and are infrequent or of short duration when for other reasons). o If current dressing causes regression in wound condition, may D/C ordered dressing product/s and apply Normal Saline Moist Dressing daily until next [place] / Other MD appointment. [place] of regression in wound condition at [phone]. o Please direct any NON-WOUND related issues/requests for orders to patient's Primary Care Physician Wound #3 Midline Back o [place] Nurse may visit PRN to address patientos wound care needs. o FACE TO FACE ENCOUNTER: MEDICARE and MEDICAID PATIENTS: I certify that this patient is under my care and that I had a face-to-face encounter that meets the physician face-to-face encounter requirements with this patient on this date. The encounter with the patient was in whole or in part for the following MEDICAL CONDITION: (primary reason for [place]) MEDICAL NECESSITY: I certify, that based on my findings, NURSING services are a medically necessary home health service. HOME BOUND STATUS: I certify that my clinical findings support that this patient is homebound (i.e., Due to illness or injury, pt requires aid of supportive devices such as crutches, cane, wheelchairs, walkers, the use of special transportation or the assistance of another person to leave their place of residence. There is a normal inability to leave the home and doing so requires considerable and taxing effort. Other absences are for medical reasons / religious services and are infrequent or of short duration when for other  reasons). o If current dressing causes regression in wound condition, may D/C ordered dressing product/s and apply Normal Saline Moist Dressing daily until next [place] / Other MD appointment. [place] of regression in wound condition at [phone]. o Please  direct any NON-WOUND related issues/requests for orders to patient's Primary Care Physician Wound #4 Right [place] Nurse may visit PRN to address patientos wound care needs. o FACE TO FACE ENCOUNTER: MEDICARE and MEDICAID PATIENTS: I certify that this patient is under my care and that I had a face-to-face encounter that meets the physician face-to-face encounter requirements with this patient on this date. The encounter with the patient was in whole or in part for the following MEDICAL CONDITION: (primary reason for [place]) MEDICAL NECESSITY: I certify, that based on my findings, NURSING services are a medically necessary home health service. HOME BOUND STATUS: I certify that my clinical findings support that this patient is homebound (i.e., Due to illness or injury, pt requires aid of Sage, Sabree H. ([id number]) supportive devices such as crutches, cane, wheelchairs, walkers, the use of special transportation or the assistance of another person to leave their place of residence. There is a normal inability to leave the home and doing so requires considerable and taxing effort. Other absences are for medical reasons / religious services and are infrequent or of short duration when for other reasons). o If current dressing causes regression in wound condition, may D/C ordered dressing product/s and apply Normal Saline Moist Dressing daily until next [place] / Other MD appointment. [place] of regression in wound condition at [phone]. o Please direct any NON-WOUND related issues/requests for orders to  patient's Primary Care Physician Wound #5 Medial [place] Nurse may visit PRN to address patientos wound care needs. o FACE TO FACE ENCOUNTER: MEDICARE and MEDICAID PATIENTS: I certify that this patient is under my care and that I had a face-to-face encounter that meets the physician face-to-face encounter requirements with this patient on this date. The encounter with the patient was in whole or in part for the following MEDICAL CONDITION: (primary reason for [place]) MEDICAL NECESSITY: I certify, that based on my findings, NURSING services are a medically necessary home health service. HOME BOUND STATUS: I certify that my clinical findings support that this patient is homebound (i.e., Due to illness or injury, pt requires aid of supportive devices such as crutches, cane, wheelchairs, walkers, the use of special transportation or the assistance of another person to leave their place of residence. There is a normal inability to leave the home and doing so requires considerable and taxing effort. Other absences are for medical reasons / religious services and are infrequent or of short duration when for other reasons). o If current dressing causes regression in wound condition, may D/C ordered dressing product/s and apply Normal Saline Moist Dressing daily until next [place] / Other MD appointment. [place] of regression in wound condition at [phone]. o Please direct any NON-WOUND related issues/requests for orders to patient's Primary Care Physician Electronic Signature(s) Signed: 06/06/2015 4:15:54 PM By: Christin Fudge MD, FACS Signed: 06/06/2015 5:14:03 PM By: Gretta Cool RN, BSN, Kim RN, BSN Entered By: Gretta Cool, RN, BSN, Kim on 06/06/2015 13:53:02 [place], Makara H. ([id number]) -------------------------------------------------------------------------------- Problem List Details Patient Name: Cassarino,  Lee-Anne H. Date of Service: 06/06/2015 1:15 PM Medical Record Patient Account Number: [id number] Number: Afful, RN, BSN, Treating RN: [birth year] (70 y.o. Velva Harman Date of Birth/Sex: Female) Other Clinician: Primary Care Physician: Carrie Mew, MIRIAM Treating Christin Fudge Referring Physician: Paulita Cradle Physician/Extender: Suella Grove in Treatment: 12 Active Problems ICD-10 Encounter Code Description Active Date Diagnosis E11.621  Type 2 diabetes mellitus with foot ulcer 03/13/2015 Yes L89.613 Pressure ulcer of right heel, stage 3 03/13/2015 Yes L89.013 Pressure ulcer of right elbow, stage 3 03/13/2015 Yes F17.218 Nicotine dependence, cigarettes, with other nicotine- 03/13/2015 Yes induced disorders L89.100 Pressure ulcer of unspecified part of back, unstageable 05/02/2015 Yes L89.222 Pressure ulcer of left hip, stage 2 05/30/2015 Yes L89.153 Pressure ulcer of sacral region, stage 3 05/30/2015 Yes Inactive Problems Resolved Problems Electronic Signature(s) Signed: 06/06/2015 1:57:11 PM By: Christin Fudge MD, FACS Entered By: Christin Fudge on 06/06/2015 13:57:11 Vejar, Naela H. ([id number]) -------------------------------------------------------------------------------- Progress Note Details Patient Name: Heffley, Trenna H. Date of Service: 06/06/2015 1:15 PM Medical Record Patient Account Number: [id number] Number: Afful, RN, BSN, Treating RN: [birth year] (69 y.o. Velva Harman Date of Birth/Sex: Female) Other Clinician: Primary Care Physician: Carrie Mew, MIRIAM Treating Christin Fudge Referring Physician: Paulita Cradle Physician/Extender: Suella Grove in Treatment: 12 Subjective Chief Complaint Information obtained from Patient Patient presents to the wound care center for a consult due non healing wound. Ulcers on the right elbow and the right heel for about 1 month. History of Present Illness (HPI) The following HPI elements were documented for the patient's wound: Location:  Ulceration on the right heel and the right elbow. Quality: Patient reports experiencing a dull pain to affected area(s). Severity: Patient states wound (s) are getting better. Duration: Patient has had the wound for < 4 weeks prior to presenting for treatment Timing: Pain in wound is Intermittent (comes and goes Context: The wound appeared gradually over time Modifying Factors: Consults to this date include:Augmentin and Bactrim and also some heel protection with duoderm Associated Signs and Symptoms: Patient reports having difficulty standing for long periods. 69 year old female with history of peripheral neuropathy, history of diet controlled diabetes mellitus type 2, history of alcoholism here for wound consult sent by her PCP Dr. Sherilyn Cooter. She has pressure ulcers at her right elbow, bilateral heels. Plain films of right calcaneus without acute bony process. Patient started by PCP on Augmentin, Bactrim as per orders, DuoDerm dressings applied - reports some improvement in her ulcer since last seen. Denies fever, chills, nausea, vomiting, diarrhea. She had a right humerus fracture in the middle of May and has had no surgery and arm is in a sling. She is also been laying in the bed for quite a while. Past medical history significant for essential hypertension, osteoporosis, peripheral neuropathy, alcoholism, ataxia, personal history of breast cancer treated with surgery chemotherapy and radiation and this was done in December 2010. she is also status post laparoscopic cholecystectomy, pilonidal cyst excision, subcutaneous port placement, partial mastectomy on the left side, skin cancer removal. 03/21/2015 -- she says overall she's been doing better and continues to smoke about 15 cigarettes a day. 03/21/2015 - her orthopedic doctor has said she may require surgery for her right humerus fracture. 04/04/2015 -- her orthopedic surgery has been scheduled for August 11. 04/18/2015 -- she is doing  fine as far as her elbow and her right heel goes but she has developed some [place]t, Kierre H. ([id number]) redness over prominence on her thoracic spine and wanted me to take a look at this. 05/02/2015 -- she had her surgery done and now is in a sling and support. Her back has developed a pressure injury of undetermined stage. She seems to be in better spirits. 05/16/2015 -- last week her right heel was looking great and we had healed it out, but she has not been offloading appropriately and has a deep tissue injury on  the right heel again. The area on her right elbow has opened out with slough and the area in the thoracic spine is also getting worse. 05/30/2015 -- she has developed 2 new ulcerations one on her left ischial tuberosity and one on the sacral region. She is still working on getting up smoking but is also unable to take her vitamins as she says she develops a diarrhea when she takes vitamins. She has increased her intake of proteins. 06/06/2015 -- the patient's husband manages to get her a low air loss mattress with initiating pressure but did not know how to use it exactly and the patient was not happy about using it. Overall she says she's been feeling better. Objective Constitutional Pulse regular. Respirations normal and unlabored. Afebrile. Vitals Time Taken: 1:20 PM, Height: 65 in, Weight: 118 lbs, BMI: 19.6. Eyes Nonicteric. Reactive to light. Ears, Nose, Mouth, and Throat Lips, teeth, and gums WNL.Marland Kitchen Moist mucosa without lesions . Neck supple and nontender. No palpable supraclavicular or cervical adenopathy. Normal sized without goiter. Respiratory WNL. No retractions.. Cardiovascular Pedal Pulses WNL. No clubbing, cyanosis or edema. Lymphatic No adneopathy. No adenopathy. No adenopathy. Musculoskeletal Adexa without tenderness or enlargement.. Digits and nails w/o clubbing, cyanosis, infection, petechiae, ischemia, or inflammatory conditions.Marland Kitchen Psychiatric Kra[place],  Ginni H. ([id number]) Judgement and insight Intact.. No evidence of depression, anxiety, or agitation.. General Notes: There is no separation of the deep tissue injury on her thoracic spine. Most of the other wounds are looking better and the one on her right heel is a stage III patient injury. The left heel still remains indeterminate. Integumentary (Hair, Skin) No suspicious lesions. No crepitus or fluctuance. No peri-wound warmth or erythema. No masses.. Wound #2 status is Open. Original cause of wound was Gradually Appeared. The wound is located on the Right Elbow. The wound measures 0.2cm length x 0.2cm width x 0.1cm depth; 0.031cm^2 area and 0.003cm^3 volume. The wound is limited to skin breakdown. There is no tunneling or undermining noted. There is a none present amount of drainage noted. The wound margin is distinct with the outline attached to the wound base. There is no granulation within the wound bed. There is a small (1-33%) amount of necrotic tissue within the wound bed including Adherent Slough. The periwound skin appearance exhibited: Dry/Scaly. The periwound skin appearance did not exhibit: Callus, Crepitus, Excoriation, Fluctuance, Friable, Induration, Localized Edema, Rash, Scarring, Maceration, Moist, Atrophie Blanche, Cyanosis, Ecchymosis, Hemosiderin Staining, Mottled, Pallor, Rubor, Erythema. Periwound temperature was noted as No Abnormality. Wound #3 status is Open. Original cause of wound was Pressure Injury. The wound is located on the Midline Back. The wound measures 3.4cm length x 1.7cm width x 0.1cm depth; 4.54cm^2 area and 0.454cm^3 volume. The wound is limited to skin breakdown. There is no tunneling or undermining noted. There is a small amount of serosanguineous drainage noted. The wound margin is distinct with the outline attached to the wound base. There is no granulation within the wound bed. There is a large (67-100%) amount of necrotic tissue within the wound  bed including Adherent Slough. The periwound skin appearance exhibited: Moist. The periwound skin appearance did not exhibit: Callus, Crepitus, Excoriation, Fluctuance, Friable, Induration, Localized Edema, Rash, Scarring, Dry/Scaly, Maceration, Atrophie Blanche, Cyanosis, Ecchymosis, Hemosiderin Staining, Mottled, Pallor, Rubor, Erythema. Periwound temperature was noted as No Abnormality. Wound #4 status is Open. Original cause of wound was Pressure Injury. The wound is located on the Right Calcaneous. The wound measures 1.4cm length x 1.2cm width x 0.1cm depth;  1.319cm^2 area and 0.132cm^3 volume. The wound is limited to skin breakdown. There is no tunneling or undermining noted. There is a small amount of serous drainage noted. The wound margin is epibole. There is small (1-33%) pink, pale granulation within the wound bed. There is a medium (34-66%) amount of necrotic tissue within the wound bed including Adherent Slough. The periwound skin appearance exhibited: Moist. The periwound skin appearance did not exhibit: Callus, Crepitus, Excoriation, Fluctuance, Friable, Induration, Localized Edema, Rash, Scarring, Dry/Scaly, Maceration, Atrophie Blanche, Cyanosis, Ecchymosis, Hemosiderin Staining, Mottled, Pallor, Rubor, Erythema. Periwound temperature was noted as No Abnormality. Wound #5 status is Open. Original cause of wound was Pressure Injury. The wound is located on the Medial Sacrum. The wound measures 0.5cm length x 0.4cm width x 0.1cm depth; 0.157cm^2 area and 0.016cm^3 volume. The wound is limited to skin breakdown. There is no tunneling or undermining noted. There is a small amount of serous drainage noted. The wound margin is flat and intact. There is small (1-33%) pink granulation within the wound bed. There is a small (1-33%) amount of necrotic tissue within the wound bed including Adherent Slough. The periwound skin appearance exhibited: Moist. The periwound skin appearance did not  exhibit: Callus, Crepitus, Excoriation, Fluctuance, Friable, Induration, Localized Edema, Rash, Scarring, Dry/Scaly, Maceration, Atrophie Blanche, Cyanosis, Ecchymosis, Hemosiderin Staining, Mottled, Pallor, Rubor, Erythema. Periwound temperature was noted as No Abnormality. Colbaugh, Malori H. ([id number]) Wound #6 status is Healed - Epithelialized. Original cause of wound was Pressure Injury. The wound is located on the Left Ilium. The wound measures 0cm length x 0cm width x 0cm depth; 0cm^2 area and 0cm^3 volume. The wound is limited to skin breakdown. There is no tunneling or undermining noted. There is a none present amount of drainage noted. The wound margin is distinct with the outline attached to the wound base. There is no granulation within the wound bed. There is no necrotic tissue within the wound bed. The periwound skin appearance exhibited: Dry/Scaly. The periwound skin appearance did not exhibit: Callus, Crepitus, Excoriation, Fluctuance, Friable, Induration, Localized Edema, Rash, Scarring, Maceration, Moist, Atrophie Blanche, Cyanosis, Ecchymosis, Hemosiderin Staining, Mottled, Pallor, Rubor, Erythema. 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 I have again addressed the need for a proper low air loss mattress with alternating pressure and the husband and our nurses will look into getting the vendor to come and set it up again. Nutrition and smoking cessation has  been discussed with the patient and she says she will be compliant. She will come back and see as next week Plan Wound Cleansing: Wound #2 Right Elbow: Clean wound with Normal Saline. Wound #3 Midline Back: Clean wound with Normal Saline. Wound #4 Right Calcaneous: Clean wound with Normal Saline. Weyandt, Maalle H. ([id number]) Wound #5 Medial Sacrum: Clean wound with Normal Saline. Anesthetic: Wound #2 Right Elbow: Topical Lidocaine 4% cream applied to wound bed prior to debridement Wound #3 Midline Back: Topical Lidocaine 4% cream applied to wound bed prior to debridement Wound #4 Right Calcaneous: Topical Lidocaine 4%  cream applied to wound bed prior to debridement Wound #5 Medial Sacrum: Topical Lidocaine 4% cream applied to wound bed prior to debridement Primary Wound Dressing: Wound #2 Right Elbow: Aquacel Ag Wound #4 Right Calcaneous: Aquacel Ag Wound #5 Medial Sacrum: Aquacel Ag Wound #3 Midline Back: Santyl Ointment Secondary Dressing: Wound #2 Right Elbow: Boardered Foam Dressing Wound #3 Midline Back: Boardered Foam Dressing Wound #4 Right Calcaneous: Boardered Foam Dressing Wound #5 Medial Sacrum: Boardered Foam Dressing Follow-up Appointments: Wound #2 Right Elbow: Return Appointment in 1 week. Wound #3 Midline Back: Return Appointment in 1 week. Wound #4 Right Calcaneous: Return Appointment in 1 week. Wound #5 Medial Sacrum: Return Appointment in 1 week. Off-Loading: Wound #3 Midline Back: Heel suspension boot to: - Sage boots Turn and reposition every 2 hours Mattress - patient refuses use of air mattress Wound #4 Right Calcaneous: Heel suspension boot to: - Sage boots Turn and reposition every 2 hours Mattress - patient refuses use of air mattress Wound #5 Medial Sacrum: Heel suspension boot to: - Sage boots Turn and reposition every 2 hours Mattress - patient refuses use of air mattress Goodlin, Landyn H. ([id number]) Home Health: Wound #2  Right Elbow: [place] Nurse may visit PRN to address patient s wound care needs. FACE TO FACE ENCOUNTER: MEDICARE and MEDICAID PATIENTS: I certify that this patient is under my care and that I had a face-to-face encounter that meets the physician face-to-face encounter requirements with this patient on this date. The encounter with the patient was in whole or in part for the following MEDICAL CONDITION: (primary reason for [place]) MEDICAL NECESSITY: I certify, that based on my findings, NURSING services are a medically necessary home health service. HOME BOUND STATUS: I certify that my clinical findings support that this patient is homebound (i.e., Due to illness or injury, pt requires aid of supportive devices such as crutches, cane, wheelchairs, walkers, the use of special transportation or the assistance of another person to leave their place of residence. There is a normal inability to leave the home and doing so requires considerable and taxing effort. Other absences are for medical reasons / religious services and are infrequent or of short duration when for other reasons). If current dressing causes regression in wound condition, may D/C ordered dressing product/s and apply Normal Saline Moist Dressing daily until next [place] / Other MD appointment. [place] of regression in wound condition at [phone]. Please direct any NON-WOUND related issues/requests for orders to patient's Primary Care Physician Wound #3 Midline Back: [place] Nurse may visit PRN to address patient s wound care needs. FACE TO FACE ENCOUNTER: MEDICARE and MEDICAID PATIENTS: I certify that this patient is under my care and that I had a face-to-face encounter that meets the physician face-to-face encounter requirements with this patient on this date. The encounter with the patient was in whole or in part for  the following MEDICAL CONDITION: (primary reason for [place]) MEDICAL NECESSITY: I certify, that based on my findings, NURSING services are a medically necessary home health service. HOME BOUND STATUS: I certify that my clinical findings support that this patient is homebound (i.e., Due to illness or injury, pt requires aid of supportive devices such as crutches, cane, wheelchairs, walkers, the use of special transportation or the assistance of another person to leave their place of residence. There is a normal inability to leave the home and doing so requires considerable  and taxing effort. Other absences are for medical reasons / religious services and are infrequent or of short duration when for other reasons). If current dressing causes regression in wound condition, may D/C ordered dressing product/s and apply Normal Saline Moist Dressing daily until next [place] / Other MD appointment. [place] of regression in wound condition at [phone]. Please direct any NON-WOUND related issues/requests for orders to patient's Primary Care Physician Wound #4 Right Calcaneous: [place] Nurse may visit PRN to address patient s wound care needs. FACE TO FACE ENCOUNTER: MEDICARE and MEDICAID PATIENTS: I certify that this patient is under my care and that I had a face-to-face encounter that meets the physician face-to-face encounter requirements with this patient on this date. The encounter with the patient was in whole or in part for the following MEDICAL CONDITION: (primary reason for [place]) MEDICAL NECESSITY: I certify, that based on my findings, NURSING services are a medically necessary home health service. HOME BOUND STATUS: I certify that my clinical findings support that this patient is homebound (i.e., Due to illness or injury, pt requires aid of supportive devices such as crutches, cane, wheelchairs, walkers, the  use of special transportation or the assistance of another person to leave their place of residence. There is a normal inability to leave the home and doing so requires considerable and taxing effort. Other absences are for medical reasons / religious services and are infrequent or of short duration when for other reasons). If current dressing causes regression in wound condition, may D/C ordered dressing product/s and apply Normal Saline Moist Dressing daily until next [place] / Other MD appointment. [place] of regression in wound condition at [phone]. Kozak, Shantell H. ([id number]) Please direct any NON-WOUND related issues/requests for orders to patient's Primary Care Physician Wound #5 Medial Sacrum: [place] Nurse may visit PRN to address patient s wound care needs. FACE TO FACE ENCOUNTER: MEDICARE and MEDICAID PATIENTS: I certify that this patient is under my care and that I had a face-to-face encounter that meets the physician face-to-face encounter requirements with this patient on this date. The encounter with the patient was in whole or in part for the following MEDICAL CONDITION: (primary reason for [place]) MEDICAL NECESSITY: I certify, that based on my findings, NURSING services are a medically necessary home health service. HOME BOUND STATUS: I certify that my clinical findings support that this patient is homebound (i.e., Due to illness or injury, pt requires aid of supportive devices such as crutches, cane, wheelchairs, walkers, the use of special transportation or the assistance of another person to leave their place of residence. There is a normal inability to leave the home and doing so requires considerable and taxing effort. Other absences are for medical reasons / religious services and are infrequent or of short duration when for other reasons). If current dressing causes regression in wound  condition, may D/C ordered dressing product/s and apply Normal Saline Moist Dressing daily until next [place] / Other MD appointment. [place] of regression in wound condition at [phone]. Please direct any NON-WOUND related issues/requests for orders to patient's Primary Care Physician We have discussed using Santyl on her thoracic spine and all other places will use silver alginate heel protectors and appropriate offloading. I have again addressed the need for a proper low air loss mattress with alternating pressure and the husband and our nurses  will look into getting the vendor to come and set it up again. Nutrition and smoking cessation has been discussed with the patient and she says she will be compliant. She will come back and see as next week Electronic Signature(s) Signed: 06/06/2015 2:01:21 PM By: Christin Fudge MD, FACS Entered By: Christin Fudge on 06/06/2015 14:01:21 [place]an, Kaylynn H. ([id number]) -------------------------------------------------------------------------------- SuperBill Details Patient Name: [name], Athea H. Date of Service: 06/06/2015 Medical Record Patient Account Number: [id number] Number: Afful, RN, BSN, Treating RN: November 04, 1945 (69 y.o. 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, stage 3 F17.218 Nicotine dependence, cigarettes, with other nicotine-induced disorders L89.100 Pressure ulcer of unspecified part of back, unstageable L89.222 Pressure ulcer of left hip, stage 2 L89.153 Pressure ulcer of sacral region, stage 3 Facility Procedures CPT4 Code: [id number] Description: 06301  - WOUND CARE VISIT-LEV 5 EST PT Modifier: Quantity: 1 Physician Procedures CPT4 Code: 6010932 Description: 35573 - WC PHYS LEVEL 3 - EST PT ICD-10 Description Diagnosis L89.613 Pressure ulcer of right heel, 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

## 2015-06-13 ENCOUNTER — Encounter: Payer: Medicare Other | Admitting: Surgery

## 2015-06-13 DIAGNOSIS — L89613 Pressure ulcer of right heel, stage 3: Secondary | ICD-10-CM | POA: Diagnosis not present

## 2015-06-14 NOTE — Progress Notes (Signed)
SCHREINER, Ailanie H. ([id number]) Visit Report for 06/13/2015 Chief Complaint Document Details Patient Name: WILHELMI, Shaida H. Date of Service: 06/13/2015 1:15 PM Medical Record Patient Account Number: [id number] Number: Afful, RN, BSN, Treating RN: [birth year] (69 y.o. Velva Harman Date of Birth/Sex: Female) Other Clinician: Primary Care Physician: Carrie Mew, MIRIAM Treating Christin Fudge Referring Physician: Paulita Cradle Physician/Extender: Suella Grove in Treatment: 13 Information Obtained from: Patient Chief Complaint Patient presents to the wound care center for a consult due non healing wound. Ulcers on the right elbow and the right heel for about 1 month. Electronic Signature(s) Signed: 06/13/2015 2:45:16 PM By: Christin Fudge MD, FACS Entered By: Christin Fudge on 06/13/2015 14:45:16 Caporale, Lyndsie H. ([id number]) -------------------------------------------------------------------------------- HPI Details Patient Name: Pickrell, Lainee H. Date of Service: 06/13/2015 1:15 PM Medical Record Patient Account Number: [id number] Number: Afful, RN, BSN, Treating RN: [birth year] (69 y.o. Velva Harman Date of Birth/Sex: Female) Other Clinician: Primary Care Physician: Carrie Mew, MIRIAM Treating Christin Fudge Referring Physician: Paulita Cradle Physician/Extender: Weeks in Treatment: 13 History of Present Illness Location: Ulceration on the right heel and the right elbow. Quality: Patient reports experiencing a dull pain to affected area(s). Severity: Patient states wound (s) are getting better. Duration: Patient has had the wound for < 4 weeks prior to presenting for treatment Timing: Pain in wound is Intermittent (comes and goes Context: The wound appeared gradually over time Modifying Factors: Consults to this date include:Augmentin and Bactrim and also some heel protection with duoderm Associated Signs and Symptoms: Patient reports having difficulty standing for long periods. HPI  Description: 69 year old female with history of peripheral neuropathy, history of diet controlled diabetes mellitus type 2, history of alcoholism here for wound consult sent by her PCP Dr. Sherilyn Cooter. She has pressure ulcers at her right elbow, bilateral heels. Plain films of right calcaneus without acute bony process. Patient started by PCP on Augmentin, Bactrim as per orders, DuoDerm dressings applied - reports some improvement in her ulcer since last seen. Denies fever, chills, nausea, vomiting, diarrhea. She had a right humerus fracture in the middle of May and has had no surgery and arm is in a sling. She is also been laying in the bed for quite a while. Past medical history significant for essential hypertension, osteoporosis, peripheral neuropathy, alcoholism, ataxia, personal history of breast cancer treated with surgery chemotherapy and radiation and this was done in December 2010. she is also status post laparoscopic cholecystectomy, pilonidal cyst excision, subcutaneous port placement, partial mastectomy on the left side, skin cancer removal. 03/21/2015 -- she says overall she's been doing better and continues to smoke about 15 cigarettes a day. 03/21/2015 - her orthopedic doctor has said she may require surgery for her right humerus fracture. 04/04/2015 -- her orthopedic surgery has been scheduled for August 11. 04/18/2015 -- she is doing fine as far as her elbow and her right heel goes but she has developed some redness over prominence on her thoracic spine and wanted me to take a look at this. 05/02/2015 -- she had her surgery done and now is in a sling and support. Her back has developed a pressure injury of undetermined stage. She seems to be in better spirits. 05/16/2015 -- last week her right heel was looking great and we had healed it out, but she has not been offloading appropriately and has a deep tissue injury on the right heel again. The area on her right elbow has opened out  with slough and the area in the thoracic spine is also getting worse. 05/30/2015 --  she has developed 2 new ulcerations one on her left ischial tuberosity and one on the sacral region. She is still working on getting up smoking but is also unable to take her vitamins as she says she Candee, Matilynn H. ([id number]) develops a diarrhea when she takes vitamins. She has increased her intake of proteins. 06/06/2015 -- the patient's husband manages to get her a low air loss mattress with initiating pressure but did not know how to use it exactly and the patient was not happy about using it. Overall she says she's been feeling better. Electronic Signature(s) Signed: 06/13/2015 2:45:26 PM By: Christin Fudge MD, FACS Entered By: Christin Fudge on 06/13/2015 14:45:26 Calvillo, Avey H. ([id number]) -------------------------------------------------------------------------------- Physical Exam Details Patient Name: Brazier, Shawnee H. Date of Service: 06/13/2015 1:15 PM Medical Record Patient Account Number: [id number] Number: Afful, RN, BSN, Treating RN: June 12, 1946 (69 y.o. Velva Harman Date of Birth/Sex: Female) Other Clinician: Primary Care Physician: Carrie Mew, MIRIAM Treating Christin Fudge Referring Physician: Paulita Cradle Physician/Extender: Weeks in Treatment: 13 Constitutional . Pulse regular. Respirations normal and unlabored. Afebrile. . Eyes Nonicteric. Reactive to light. Ears, Nose, Mouth, and Throat Lips, teeth, and gums WNL.Marland Kitchen Moist mucosa without lesions . Neck supple and nontender. No palpable supraclavicular or cervical adenopathy. Normal sized without goiter. Respiratory WNL. No retractions.. Cardiovascular Pedal Pulses WNL. No clubbing, cyanosis or edema. Chest Breasts symmetical and no nipple discharge.. Breast tissue WNL, no masses, lumps, or tenderness.. Lymphatic No adneopathy. No adenopathy. No adenopathy. Musculoskeletal Adexa without tenderness or enlargement.. Digits  and nails w/o clubbing, cyanosis, infection, petechiae, ischemia, or inflammatory conditions.. Integumentary (Hair, Skin) No suspicious lesions. No crepitus or fluctuance. No peri-wound warmth or erythema. No masses.Marland Kitchen Psychiatric Judgement and insight Intact.. No evidence of depression, anxiety, or agitation.. Notes The thoracic spine region looks about the same and has no separation yet from the deep tissue. All the other wounds look much better and we will continue with the dressings as before Electronic Signature(s) Signed: 06/13/2015 2:48:33 PM By: Christin Fudge MD, FACS Entered By: Christin Fudge on 06/13/2015 14:48:33 Mckendry, Jannie H. ([id number]) -------------------------------------------------------------------------------- Physician Orders Details Patient Name: Arredondo, Ellesse H. Date of Service: 06/13/2015 1:15 PM Medical Record Number: [id number] Patient Account Number: [id number] Date of Birth/Sex: [birth year] (69 y.o. Female) Treating RN: Cornell Barman Primary Care Physician: Paulita Cradle Other Clinician: Referring Physician: Paulita Cradle Treating Physician/Extender: Frann Rider in Treatment: 4 Verbal / Phone Orders: Yes Clinician: Cornell Barman Read Back and Verified: Yes Diagnosis Coding Wound Cleansing Wound #2 Right Elbow o Clean wound with Normal Saline. o May Shower, gently pat wound dry prior to applying new dressing. Wound #3 Midline Back o Clean wound with Normal Saline. o May Shower, gently pat wound dry prior to applying new dressing. Wound #4 Right Calcaneous o Clean wound with Normal Saline. o May Shower, gently pat wound dry prior to applying new dressing. Wound #5 Medial Sacrum o Clean wound with Normal Saline. o May Shower, gently pat wound dry prior to applying new dressing. Anesthetic Wound #2 Right Elbow o Topical Lidocaine 4% cream applied to wound bed prior to debridement Wound #4 Right Calcaneous o Topical  Lidocaine 4% cream applied to wound bed prior to debridement Skin Barriers/Peri-Wound Care Wound #3 Midline Back o Barrier cream Wound #5 Medial Sacrum o Barrier cream Primary Wound Dressing Wound #2 Right Elbow o Aquacel Ag Wound #3 Midline Back o Santyl Ointment Nydam, Lechelle H. ([id number]) Wound #4 Right Calcaneous o Aquacel Ag Secondary Dressing Wound #2 Right  Elbow o Boardered Foam Dressing Wound #3 Midline Back o Boardered Foam Dressing Wound #4 Right Calcaneous o Boardered Foam Dressing Dressing Change Frequency Wound #2 Right Elbow o Change dressing every other day. Wound #3 Midline Back o Change dressing every day. Wound #4 Right Calcaneous o Change dressing every other day. Follow-up Appointments Wound #2 Right Elbow o Return Appointment in 1 week. Wound #3 Midline Back o Return Appointment in 1 week. Wound #4 Right Calcaneous o Return Appointment in 1 week. Wound #5 Medial Sacrum o Return Appointment in 1 week. Off-Loading Wound #2 Right Elbow o Heel suspension boot to: - Sage boots heels o Turn and reposition every 2 hours o Mattress - patient refuses use of air mattress Wound #3 Midline Back o Heel suspension boot to: - Sage boots heels o Turn and reposition every 2 hours o Mattress - patient refuses use of air mattress Tiznado, Via H. ([id number]) Wound #4 Right Calcaneous o Heel suspension boot to: - Sage boots heels o Turn and reposition every 2 hours o Mattress - patient refuses use of air mattress Wound #5 Medial Sacrum o Heel suspension boot to: - Sage boots heels o Turn and reposition every 2 hours o Mattress - patient refuses use of air mattress Home Health Wound #2 Right Elbow o [place] Nurse may visit PRN to address patientos wound care needs. o FACE TO FACE ENCOUNTER: MEDICARE and MEDICAID PATIENTS: I certify that this patient is under my care  and that I had a face-to-face encounter that meets the physician face-to-face encounter requirements with this patient on this date. The encounter with the patient was in whole or in part for the following MEDICAL CONDITION: (primary reason for [place]) MEDICAL NECESSITY: I certify, that based on my findings, NURSING services are a medically necessary home health service. HOME BOUND STATUS: I certify that my clinical findings support that this patient is homebound (i.e., Due to illness or injury, pt requires aid of supportive devices such as crutches, cane, wheelchairs, walkers, the use of special transportation or the assistance of another person to leave their place of residence. There is a normal inability to leave the home and doing so requires considerable and taxing effort. Other absences are for medical reasons / religious services and are infrequent or of short duration when for other reasons). o If current dressing causes regression in wound condition, may D/C ordered dressing product/s and apply Normal Saline Moist Dressing daily until next [place] / Other MD appointment. [place] of regression in wound condition at [phone]. o Please direct any NON-WOUND related issues/requests for orders to patient's Primary Care Physician Wound #3 Midline Back o [place] Nurse may visit PRN to address patientos wound care needs. o FACE TO FACE ENCOUNTER: MEDICARE and MEDICAID PATIENTS: I certify that this patient is under my care and that I had a face-to-face encounter that meets the physician face-to-face encounter requirements with this patient on this date. The encounter with the patient was in whole or in part for the following MEDICAL CONDITION: (primary reason for [place]) MEDICAL NECESSITY: I certify, that based on my findings, NURSING services are a medically necessary home health service. HOME  BOUND STATUS: I certify that my clinical findings support that this patient is homebound (i.e., Due to illness or injury, pt requires aid of supportive devices such as crutches, cane, wheelchairs, walkers, the use of special transportation or the  assistance of another person to leave their place of residence. There is a normal inability to leave the home and doing so requires considerable and taxing effort. Other absences are for medical reasons / religious services and are infrequent or of short duration when for other reasons). Petrasek, Lyndon H. ([id number]) o If current dressing causes regression in wound condition, may D/C ordered dressing product/s and apply Normal Saline Moist Dressing daily until next [place] / Other MD appointment. [place] of regression in wound condition at [phone]. o Please direct any NON-WOUND related issues/requests for orders to patient's Primary Care Physician Wound #4 Right [place] Nurse may visit PRN to address patientos wound care needs. o FACE TO FACE ENCOUNTER: MEDICARE and MEDICAID PATIENTS: I certify that this patient is under my care and that I had a face-to-face encounter that meets the physician face-to-face encounter requirements with this patient on this date. The encounter with the patient was in whole or in part for the following MEDICAL CONDITION: (primary reason for [place]) MEDICAL NECESSITY: I certify, that based on my findings, NURSING services are a medically necessary home health service. HOME BOUND STATUS: I certify that my clinical findings support that this patient is homebound (i.e., Due to illness or injury, pt requires aid of supportive devices such as crutches, cane, wheelchairs, walkers, the use of special transportation or the assistance of another person to leave their place of residence. There is a normal inability to leave the  home and doing so requires considerable and taxing effort. Other absences are for medical reasons / religious services and are infrequent or of short duration when for other reasons). o If current dressing causes regression in wound condition, may D/C ordered dressing product/s and apply Normal Saline Moist Dressing daily until next [place] / Other MD appointment. [place] of regression in wound condition at [phone]. o Please direct any NON-WOUND related issues/requests for orders to patient's Primary Care Physician Wound #5 Medial [place] Nurse may visit PRN to address patientos wound care needs. o FACE TO FACE ENCOUNTER: MEDICARE and MEDICAID PATIENTS: I certify that this patient is under my care and that I had a face-to-face encounter that meets the physician face-to-face encounter requirements with this patient on this date. The encounter with the patient was in whole or in part for the following MEDICAL CONDITION: (primary reason for [place]) MEDICAL NECESSITY: I certify, that based on my findings, NURSING services are a medically necessary home health service. HOME BOUND STATUS: I certify that my clinical findings support that this patient is homebound (i.e., Due to illness or injury, pt requires aid of supportive devices such as crutches, cane, wheelchairs, walkers, the use of special transportation or the assistance of another person to leave their place of residence. There is a normal inability to leave the home and doing so requires considerable and taxing effort. Other absences are for medical reasons / religious services and are infrequent or of short duration when for other reasons). o If current dressing causes regression in wound condition, may D/C ordered dressing product/s and apply Normal Saline Moist Dressing daily until next [place] / Other MD appointment.  [place] of regression in wound condition at [phone]. o Please direct any NON-WOUND related issues/requests for orders to patient's Primary Care Physician Electronic Signature(s) Elba, Chameka H. ([id number]) Signed: 06/13/2015 4:17:16 PM  By: Christin Fudge MD, FACS Signed: 06/13/2015 4:53:29 PM By: Gretta Cool RN, BSN, Kim RN, BSN Entered By: Gretta Cool, RN, BSN, Kim on 06/13/2015 13:50:27 Clements, Lyna H. ([id number]) -------------------------------------------------------------------------------- Problem List Details Patient Name: Duford, Lavette H. Date of Service: 06/13/2015 1:15 PM Medical Record Patient Account Number: [id number] Number: Afful, RN, BSN, Treating RN: [birth year] (69 y.o. Velva Harman Date of Birth/Sex: Female) Other Clinician: Primary Care Physician: Carrie Mew, MIRIAM Treating Britto, Errol Referring Physician: Paulita Cradle Physician/Extender: Suella Grove in Treatment: 13 Active Problems ICD-10 Encounter Code Description Active Date Diagnosis E11.621 Type 2 diabetes mellitus with foot ulcer 03/13/2015 Yes L89.613 Pressure ulcer of right heel, stage 3 03/13/2015 Yes L89.013 Pressure ulcer of right elbow, stage 3 03/13/2015 Yes F17.218 Nicotine dependence, cigarettes, with other nicotine- 03/13/2015 Yes induced disorders L89.100 Pressure ulcer of unspecified part of back, unstageable 05/02/2015 Yes L89.222 Pressure ulcer of left hip, stage 2 05/30/2015 Yes L89.153 Pressure ulcer of sacral region, stage 3 05/30/2015 Yes Inactive Problems Resolved Problems Electronic Signature(s) Signed: 06/13/2015 2:45:08 PM By: Christin Fudge MD, FACS Entered By: Christin Fudge on 06/13/2015 14:45:08 Donigan, Illene H. ([id number]) -------------------------------------------------------------------------------- Progress Note Details Patient Name: Gearing, Emalene H. Date of Service: 06/13/2015 1:15 PM Medical Record Patient Account Number: [id number] Number: Afful,  RN, BSN, Treating RN: Apr 02, 1946 (69 y.o. Velva Harman Date of Birth/Sex: Female) Other Clinician: Primary Care Physician: Carrie Mew, MIRIAM Treating Christin Fudge Referring Physician: Paulita Cradle Physician/Extender: Suella Grove in Treatment: 13 Subjective Chief Complaint Information obtained from Patient Patient presents to the wound care center for a consult due non healing wound. Ulcers on the right elbow and the right heel for about 1 month. History of Present Illness (HPI) The following HPI elements were documented for the patient's wound: Location: Ulceration on the right heel and the right elbow. Quality: Patient reports experiencing a dull pain to affected area(s). Severity: Patient states wound (s) are getting better. Duration: Patient has had the wound for < 4 weeks prior to presenting for treatment Timing: Pain in wound is Intermittent (comes and goes Context: The wound appeared gradually over time Modifying Factors: Consults to this date include:Augmentin and Bactrim and also some heel protection with duoderm Associated Signs and Symptoms: Patient reports having difficulty standing for long periods. 69 year old female with history of peripheral neuropathy, history of diet controlled diabetes mellitus type 2, history of alcoholism here for wound consult sent by her PCP Dr. Sherilyn Cooter. She has pressure ulcers at her right elbow, bilateral heels. Plain films of right calcaneus without acute bony process. Patient started by PCP on Augmentin, Bactrim as per orders, DuoDerm dressings applied - reports some improvement in her ulcer since last seen. Denies fever, chills, nausea, vomiting, diarrhea. She had a right humerus fracture in the middle of May and has had no surgery and arm is in a sling. She is also been laying in the bed for quite a while. Past medical history significant for essential hypertension, osteoporosis, peripheral neuropathy, alcoholism, ataxia, personal history of  breast cancer treated with surgery chemotherapy and radiation and this was done in December 2010. she is also status post laparoscopic cholecystectomy, pilonidal cyst excision, subcutaneous port placement, partial mastectomy on the left side, skin cancer removal. 03/21/2015 -- she says overall she's been doing better and continues to smoke about 15 cigarettes a day. 03/21/2015 - her orthopedic doctor has said she may require surgery for her right humerus fracture. 04/04/2015 -- her orthopedic surgery has been scheduled for August 11. 04/18/2015 -- she is doing fine as far as  her elbow and her right heel goes but she has developed some Lona, Debra H. ([id number]) redness over prominence on her thoracic spine and wanted me to take a look at this. 05/02/2015 -- she had her surgery done and now is in a sling and support. Her back has developed a pressure injury of undetermined stage. She seems to be in better spirits. 05/16/2015 -- last week her right heel was looking great and we had healed it out, but she has not been offloading appropriately and has a deep tissue injury on the right heel again. The area on her right elbow has opened out with slough and the area in the thoracic spine is also getting worse. 05/30/2015 -- she has developed 2 new ulcerations one on her left ischial tuberosity and one on the sacral region. She is still working on getting up smoking but is also unable to take her vitamins as she says she develops a diarrhea when she takes vitamins. She has increased her intake of proteins. 06/06/2015 -- the patient's husband manages to get her a low air loss mattress with initiating pressure but did not know how to use it exactly and the patient was not happy about using it. Overall she says she's been feeling better. Objective Constitutional Pulse regular. Respirations normal and unlabored. Afebrile. Vitals Time Taken: 1:28 PM, Height: 65 in, Weight: 118 lbs, BMI: 19.6, Temperature:  98.7 F, Pulse: 105 bpm, Respiratory Rate: 20 breaths/min, Blood Pressure: 117/79 mmHg. Eyes Nonicteric. Reactive to light. Ears, Nose, Mouth, and Throat Lips, teeth, and gums WNL.Marland Kitchen Moist mucosa without lesions . Neck supple and nontender. No palpable supraclavicular or cervical adenopathy. Normal sized without goiter. Respiratory WNL. No retractions.. Cardiovascular Pedal Pulses WNL. No clubbing, cyanosis or edema. Chest Breasts symmetical and no nipple discharge.. Breast tissue WNL, no masses, lumps, or tenderness.. Lymphatic No adneopathy. No adenopathy. No adenopathy. Musculoskeletal Sl[place]ki, Rinoa H. ([id number]) Adexa without tenderness or enlargement.. Digits and nails w/o clubbing, cyanosis, infection, petechiae, ischemia, or inflammatory conditions.Marland Kitchen Psychiatric Judgement and insight Intact.. No evidence of depression, anxiety, or agitation.. General Notes: The thoracic spine region looks about the same and has no separation yet from the deep tissue. All the other wounds look much better and we will continue with the dressings as before Integumentary (Hair, Skin) No suspicious lesions. No crepitus or fluctuance. No peri-wound warmth or erythema. No masses.. Wound #2 status is Open. Original cause of wound was Gradually Appeared. The wound is located on the Right Elbow. The wound measures 0.5cm length x 0.3cm width x 0.1cm depth; 0.118cm^2 area and 0.012cm^3 volume. The wound is limited to skin breakdown. There is a none present amount of drainage noted. The wound margin is distinct with the outline attached to the wound base. There is small (1-33%) red granulation within the wound bed. There is a small (1-33%) amount of necrotic tissue within the wound bed including Adherent Slough. The periwound skin appearance did not exhibit: Callus, Crepitus, Excoriation, Fluctuance, Friable, Induration, Localized Edema, Rash, Scarring, Dry/Scaly, Maceration, Moist, Atrophie Blanche,  Cyanosis, Ecchymosis, Hemosiderin Staining, Mottled, Pallor, Rubor, Erythema. Periwound temperature was noted as No Abnormality. Wound #3 status is Open. Original cause of wound was Pressure Injury. The wound is located on the Midline Back. The wound measures 3.5cm length x 1.6cm width x 0.1cm depth; 4.398cm^2 area and 0.44cm^3 volume. Wound #4 status is Open. Original cause of wound was Pressure Injury. The wound is located on the Right Calcaneous. The wound measures 1.5cm length x 1.2cm  width x 0.1cm depth; 1.414cm^2 area and 0.141cm^3 volume. Wound #5 status is Open. Original cause of wound was Pressure Injury. The wound is located on the Medial Sacrum. The wound measures 0.3cm length x 0.2cm width x 0.1cm depth; 0.047cm^2 area and 0.005cm^3 volume. 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 ([id number]) The thoracic spine region looks about the same and has no separation yet from the deep tissue. All the other wounds look much better and we will continue with the dressings as before Plan Wound Cleansing: Wound #2 Right Elbow: Clean wound with Normal Saline. May Shower, gently pat wound dry prior to applying new dressing. Wound #3 Midline Back: Clean wound with Normal Saline. May Shower, gently pat wound dry prior to applying new dressing. Wound #4 Right Calcaneous: Clean wound with Normal Saline. May Shower, gently pat wound dry prior to applying new dressing. Wound #5 Medial Sacrum: Clean wound with Normal Saline. May Shower, gently pat wound dry prior to applying new  dressing. Anesthetic: Wound #2 Right Elbow: Topical Lidocaine 4% cream applied to wound bed prior to debridement Wound #4 Right Calcaneous: Topical Lidocaine 4% cream applied to wound bed prior to debridement Skin Barriers/Peri-Wound Care: Wound #3 Midline Back: Barrier cream Wound #5 Medial Sacrum: Barrier cream Primary Wound Dressing: Wound #2 Right Elbow: Aquacel Ag Wound #3 Midline Back: Santyl Ointment Wound #4 Right Calcaneous: Aquacel Ag Secondary Dressing: Wound #2 Right Elbow: Boardered Foam Dressing Wound #3 Midline Back: Boardered Foam Dressing Wound #4 Right Calcaneous: Boardered Foam Dressing Dressing Change Frequency: Vuolo, Kindal H. ([id number]) Wound #2 Right Elbow: Change dressing every other day. Wound #3 Midline Back: Change dressing every day. Wound #4 Right Calcaneous: Change dressing every other day. Follow-up Appointments: Wound #2 Right Elbow: Return Appointment in 1 week. Wound #3 Midline Back: Return Appointment in 1 week. Wound #4 Right Calcaneous: Return Appointment in 1 week. Wound #5 Medial Sacrum: Return Appointment in 1 week. Off-Loading: Wound #2 Right Elbow: Heel suspension boot to: - Sage boots heels Turn and reposition every 2 hours Mattress - patient refuses use of air mattress Wound #3 Midline Back: Heel suspension boot to: - Sage boots heels Turn and reposition every 2 hours Mattress - patient refuses use of air mattress Wound #4 Right Calcaneous: Heel suspension boot to: - Sage boots heels Turn and reposition every 2 hours Mattress - patient refuses use of air mattress Wound #5 Medial Sacrum: Heel suspension boot to: - Sage boots heels Turn and reposition every 2 hours Mattress - patient refuses use of air mattress Home Health: Wound #2 Right Elbow: [place] Nurse may visit PRN to address patient s wound care needs. FACE TO FACE ENCOUNTER: MEDICARE and MEDICAID PATIENTS: I certify that  this patient is under my care and that I had a face-to-face encounter that meets the physician face-to-face encounter requirements with this patient on this date. The encounter with the patient was in whole or in part for the following MEDICAL CONDITION: (primary reason for [place]) MEDICAL NECESSITY: I certify, that based on my findings, NURSING services are a medically necessary home health service.  HOME BOUND STATUS: I certify that my clinical findings support that this patient is homebound (i.e., Due to illness or injury, pt requires aid of supportive devices such as crutches, cane, wheelchairs, walkers, the use of special transportation or the assistance of another person to leave their place of residence. There is a normal inability to leave the home and doing so requires considerable and taxing effort. Other absences are for medical reasons / religious services and are infrequent or of short duration when for other reasons). If current dressing causes regression in wound condition, may D/C ordered dressing product/s and apply Normal Saline Moist Dressing daily until next [place] / Other MD appointment. [place] of regression in wound condition at [phone]. Please direct any NON-WOUND related issues/requests for orders to patient's Primary Care Physician Wound #3 Midline Back: JAHNIYA, DUZAN ([id number]) [place] Nurse may visit PRN to address patient s wound care needs. FACE TO FACE ENCOUNTER: MEDICARE and MEDICAID PATIENTS: I certify that this patient is under my care and that I had a face-to-face encounter that meets the physician face-to-face encounter requirements with this patient on this date. The encounter with the patient was in whole or in part for the following MEDICAL CONDITION: (primary reason for [place]) MEDICAL NECESSITY: I certify, that based on my findings, NURSING services are a medically  necessary home health service. HOME BOUND STATUS: I certify that my clinical findings support that this patient is homebound (i.e., Due to illness or injury, pt requires aid of supportive devices such as crutches, cane, wheelchairs, walkers, the use of special transportation or the assistance of another person to leave their place of residence. There is a normal inability to leave the home and doing so requires considerable and taxing effort. Other absences are for medical reasons / religious services and are infrequent or of short duration when for other reasons). If current dressing causes regression in wound condition, may D/C ordered dressing product/s and apply Normal Saline Moist Dressing daily until next [place] / Other MD appointment. [place] of regression in wound condition at [phone]. Please direct any NON-WOUND related issues/requests for orders to patient's Primary Care Physician Wound #4 Right Calcaneous: [place] Nurse may visit PRN to address patient s wound care needs. FACE TO FACE ENCOUNTER: MEDICARE and MEDICAID PATIENTS: I certify that this patient is under my care and that I had a face-to-face encounter that meets the physician face-to-face encounter requirements with this patient on this date. The encounter with the patient was in whole or in part for the following MEDICAL CONDITION: (primary reason for [place]) MEDICAL NECESSITY: I certify, that based on my findings, NURSING services are a medically necessary home health service. HOME BOUND STATUS: I certify that my clinical findings support that this patient is homebound (i.e., Due to illness or injury, pt requires aid of supportive devices such as crutches, cane, wheelchairs, walkers, the use of special transportation or the assistance of another person to leave their place of residence. There is a normal inability to leave the home and doing so  requires considerable and taxing effort. Other absences are for medical reasons / religious services and are infrequent or of short duration when for other reasons). If current dressing causes regression in wound condition, may D/C ordered dressing product/s and apply Normal Saline Moist Dressing daily until next [place] / Other MD appointment. [place]  of regression in wound condition at [phone]. Please direct any NON-WOUND related issues/requests for orders to patient's Primary Care Physician Wound #5 Medial Sacrum: [place] Nurse may visit PRN to address patient s wound care needs. FACE TO FACE ENCOUNTER: MEDICARE and MEDICAID PATIENTS: I certify that this patient is under my care and that I had a face-to-face encounter that meets the physician face-to-face encounter requirements with this patient on this date. The encounter with the patient was in whole or in part for the following MEDICAL CONDITION: (primary reason for [place]) MEDICAL NECESSITY: I certify, that based on my findings, NURSING services are a medically necessary home health service. HOME BOUND STATUS: I certify that my clinical findings support that this patient is homebound (i.e., Due to illness or injury, pt requires aid of supportive devices such as crutches, cane, wheelchairs, walkers, the use of special transportation or the assistance of another person to leave their place of residence. There is a normal inability to leave the home and doing so requires considerable and taxing effort. Other absences are for medical reasons / religious services and are infrequent or of short duration when for other reasons). If current dressing causes regression in wound condition, may D/C ordered dressing product/s and apply Normal Saline Moist Dressing daily until next [place] / Other MD appointment. [place] of regression in  wound condition at [phone]. Please direct any NON-WOUND related issues/requests for orders to patient's Primary Care Physician [place], [place]. ([id number]) We have discussed using Santyl on her thoracic spine and all other places will use silver alginate heel protectors and appropriate offloading. Nutrition and smoking cessation has been discussed with the patient and she says she will be compliant. She will come back and see as next week Electronic Signature(s) Signed: 06/13/2015 2:49:41 PM By: Christin Fudge MD, FACS Entered By: Christin Fudge on 06/13/2015 14:49:40 Lacap, Elsy H. ([id number]) -------------------------------------------------------------------------------- SuperBill Details Patient Name: [name], Liberta H. Date of Service: 06/13/2015 Medical Record Patient Account Number: [id number] Number: Afful, RN, BSN, Treating RN: Sep 13, 1946 (69 y.o. 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, stage 3 Facility Procedures CPT4 Code: 36629476 Description: 54650 - WOUND CARE VISIT-LEV 5 EST PT Modifier: Quantity: 1 Physician Procedures CPT4 Code: [id number] Description: 12751 - WC PHYS LEVEL 3 - EST PT ICD-10 Description Diagnosis 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, stage 3 L89.013 Pressure ulcer of right elbow, stage 3 L89.100 Pressure ulcer of  unspecified  part of back, uns Modifier: tageable Quantity: 1 Electronic Signature(s) Signed: 06/13/2015 2:50:11 PM By: Christin Fudge MD, FACS Entered By: Christin Fudge on 06/13/2015 14:50:11

## 2015-06-14 NOTE — Progress Notes (Signed)
[name], [name] ([id number]) Visit Report for 06/13/2015 Arrival Information Details Patient Name: [name], [name] Date of Service: 06/13/2015 1:15 PM Medical Record Number: [id number] Patient Account Number: [id number] Date of Birth/Sex: [birth year] (69 y.o. Female) Treating RN: Cornell Barman Primary Care Physician: Paulita Cradle Other Clinician: Referring Physician: Paulita Cradle Treating Physician/Extender: Frann Rider in Treatment: 13 Visit Information History Since Last Visit Added or deleted any medications: No Patient Arrived: Wheel Chair Any new allergies or adverse reactions: No Arrival Time: 13:28 Had a fall or experienced change in No activities of daily living that may affect Accompanied By: husband risk of falls: Transfer Assistance: Manual Signs or symptoms of abuse/neglect since last No Patient Identification Verified: Yes visito Secondary Verification Process Yes Hospitalized since last visit: No Completed: Has Dressing in Place as Prescribed: Yes Patient Requires Transmission-Based No Pain Present Now: No Precautions: Patient Has Alerts: No Electronic Signature(s) Signed: 06/13/2015 4:53:29 PM By: Gretta Cool, RN, BSN, Kim RN, BSN Entered By: Gretta Cool, RN, BSN, Kim on 06/13/2015 78:29:56 [name], [name] ([id number]) -------------------------------------------------------------------------------- Clinic Level of Care Assessment Details Patient Name: [name], [name] Date of Service: 06/13/2015 1:15 PM Medical Record Number: [id number] Patient Account Number: [id number] Date of Birth/Sex: May 16, 1946 (69 y.o. Female) Treating RN: Cornell Barman Primary Care Physician: Paulita Cradle Other Clinician: Referring Physician: Paulita Cradle Treating Physician/Extender: Frann Rider in Treatment: 13 Clinic Level of Care Assessment Items TOOL 4 Quantity Score []  - Use when only an EandM is performed on FOLLOW-UP visit 0 ASSESSMENTS - Nursing Assessment /  Reassessment []  - Reassessment of Co-morbidities (includes updates in patient status) 0 X - Reassessment of Adherence to Treatment Plan 1 5 ASSESSMENTS - Wound and Skin Assessment / Reassessment []  - Simple Wound Assessment / Reassessment - one wound 0 X - Complex Wound Assessment / Reassessment - multiple wounds 4 5 []  - Dermatologic / Skin Assessment (not related to wound area) 0 ASSESSMENTS - Focused Assessment []  - Circumferential Edema Measurements - multi extremities 0 []  - Nutritional Assessment / Counseling / Intervention 0 []  - Lower Extremity Assessment (monofilament, tuning fork, pulses) 0 []  - Peripheral Arterial Disease Assessment (using hand held doppler) 0 ASSESSMENTS - Ostomy and/or Continence Assessment and Care []  - Incontinence Assessment and Management 0 []  - Ostomy Care Assessment and Management (repouching, etc.) 0 PROCESS - Coordination of Care []  - Simple Patient / Family Education for ongoing care 0 X - Complex (extensive) Patient / Family Education for ongoing care 1 20 X - Staff obtains Programmer, systems, Records, Test Results / Process Orders 1 10 []  - Staff telephones HHA, Nursing Homes / Clarify orders / etc 0 []  - Routine Transfer to another Facility (non-emergent condition) 0 [name], [name] ([id number]) []  - Routine Hospital Admission (non-emergent condition) 0 []  - New Admissions / Biomedical engineer / Ordering NPWT, Apligraf, etc. 0 []  - Emergency Hospital Admission (emergent condition) 0 X - Simple Discharge Coordination 1 10 []  - Complex (extensive) Discharge Coordination 0 PROCESS - Special Needs []  - Pediatric / Minor Patient Management 0 []  - Isolation Patient Management 0 []  - Hearing / Language / Visual special needs 0 []  - Assessment of Community assistance (transportation, D/C planning, etc.) 0 []  - Additional assistance / Altered mentation 0 []  - Support Surface(s) Assessment (bed, cushion, seat, etc.) 0 INTERVENTIONS - Wound Cleansing /  Measurement []  - Simple Wound Cleansing - one wound 0 X - Complex Wound Cleansing - multiple wounds 4 5 X - Wound Imaging (photographs - any number  of wounds) 1 5 []  - Wound Tracing (instead of photographs) 0 []  - Simple Wound Measurement - one wound 0 X - Complex Wound Measurement - multiple wounds 4 5 INTERVENTIONS - Wound Dressings []  - Small Wound Dressing one or multiple wounds 0 X - Medium Wound Dressing one or multiple wounds 4 15 []  - Large Wound Dressing one or multiple wounds 0 []  - Application of Medications - topical 0 []  - Application of Medications - injection 0 INTERVENTIONS - Miscellaneous []  - External ear exam 0 [name], [name] ([id number]) []  - Specimen Collection (cultures, biopsies, blood, body fluids, etc.) 0 []  - Specimen(s) / Culture(s) sent or taken to Lab for analysis 0 []  - Patient Transfer (multiple staff / Civil Service fast streamer / Similar devices) 0 []  - Simple Staple / Suture removal (25 or less) 0 []  - Complex Staple / Suture removal (26 or more) 0 []  - Hypo / Hyperglycemic Management (close monitor of Blood Glucose) 0 []  - Ankle / Brachial Index (ABI) - do not check if billed separately 0 X - Vital Signs 1 5 Has the patient been seen at the hospital within the last three years: Yes Total Score: 175 Level Of Care: New/Established - Level 5 Electronic Signature(s) Signed: 06/13/2015 4:53:29 PM By: Gretta Cool, RN, BSN, Kim RN, BSN Entered By: Gretta Cool, RN, BSN, Kim on 06/13/2015 13:52:22 [name], [name] ([id number]) -------------------------------------------------------------------------------- Encounter Discharge Information Details Patient Name: [name], [name] Date of Service: 06/13/2015 1:15 PM Medical Record Number: [id number] Patient Account Number: [id number] Date of Birth/Sex: [birth year] (69 y.o. Female) Treating RN: Cornell Barman Primary Care Physician: Paulita Cradle Other Clinician: Referring Physician: Paulita Cradle Treating Physician/Extender: Frann Rider in Treatment: 101 Encounter Discharge Information Items Discharge Pain Level: 1 Discharge Condition: Stable Ambulatory Status: Wheelchair Discharge Destination: Home Transportation: Private Auto Accompanied By: husband Schedule Follow-up Appointment: Yes Medication Reconciliation completed and provided to Patient/Care Yes Mckala Pantaleon: Provided on Clinical Summary of Care: 06/13/2015 Form Type Recipient Paper Patient AS Electronic Signature(s) Signed: 06/13/2015 2:04:20 PM By: Ruthine Dose Entered By: Ruthine Dose on 06/13/2015 14:04:20 [name], [name] ([id number]) -------------------------------------------------------------------------------- Lower Extremity Assessment Details Patient Name: [name], [name] Date of Service: 06/13/2015 1:15 PM Medical Record Number: [id number] Patient Account Number: [id number] Date of Birth/Sex: 12-17-1945 (69 y.o. Female) Treating RN: Cornell Barman Primary Care Physician: Paulita Cradle Other Clinician: Referring Physician: Paulita Cradle Treating Physician/Extender: Frann Rider in Treatment: 13 Vascular Assessment Pulses: Posterior Tibial Dorsalis Pedis Palpable: [Left:Yes] [Right:Yes] Extremity colors, hair growth, and conditions: Extremity Color: [Left:Hyperpigmented] [Right:Hyperpigmented] Hair Growth on Extremity: [Left:No] [Right:No] Temperature of Extremity: [Left:Warm] [Right:Warm] Capillary Refill: [Left:< 3 seconds] [Right:< 3 seconds] Toe Nail Assessment Left: Right: Thick: Yes Yes Discolored: No No Deformed: No No Improper Length and Hygiene: No No Electronic Signature(s) Signed: 06/13/2015 4:53:29 PM By: Gretta Cool, RN, BSN, Kim RN, BSN Entered By: Gretta Cool, RN, BSN, Kim on 06/13/2015 13:29:41 Garmon, Yaeko H. ([id number]) -------------------------------------------------------------------------------- Multi Wound Chart Details Patient Name: [name], Christeena H. Date of Service: 06/13/2015 1:15 PM Medical Record  Number: [id number] Patient Account Number: [id number] Date of Birth/Sex: 02-12-1946 (69 y.o. 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] [3:Pressure Ulcer] [4:Pressure Ulcer] Comorbid History: [2:Cataracts, Asthma, Hypertension, Type II Diabetes, Neuropathy, Received Chemotherapy] [3:N/A] [4:N/A] Date Acquired: [2:02/25/2015] [3:05/02/2015] [4:05/11/2015] Weeks of Treatment: [2:13] [3:6] [4:4] Wound Status: [2:Open] [3:Open] [4:Open] Measurements L x W x D 0.5x0.3x0.1 [3:3.5x1.6x0.1] [4:1.5x1.2x0.1] (cm) Area (cm) : [2:0.118] [3:4.398] [4:1.414] Volume (cm) : [2:0.012] [3:0.44] [4:0.141] % Reduction in Area: [2:93.70%] [3:-1899.10%] [4:60.90%] % Reduction in Volume: 96.80% [3:-1900.00%] [4:60.90%] Classification: [2:Category/Stage III] [3:Category/Stage II] [4:Category/Stage III] Exudate Amount: [2:None Present] [3:N/A] [4:N/A] Wound Margin: [2:Distinct, outline attached] [3:N/A] [4:N/A] Granulation Amount: [2:Small (1-33%)] [3:N/A] [4:N/A] Granulation Quality: [2:Red] [3:N/A] [4:N/A] Necrotic Amount: [2:Small (1-33%)] [3:N/A] [4:N/A] Exposed Structures: [2:Fascia: No Fat: No Tendon: No Muscle: No Joint: No Bone: No] [3:N/A] [4:N/A] Limited to Skin Breakdown Periwound Skin Texture: Edema: No No Abnormalities Noted No Abnormalities Noted Excoriation: No Induration: No Callus: No Crepitus: No Fluctuance: No Friable:  No Rash: No Scarring: No Periwound Skin Maceration: No No Abnormalities Noted No Abnormalities Noted Moisture: Moist: No Dry/Scaly: No Periwound Skin Color: Atrophie Blanche: No No Abnormalities Noted No Abnormalities Noted Cyanosis: No Ecchymosis: No Erythema: No Hemosiderin Staining: No Mottled: No Pallor: No Rubor: No Temperature: No Abnormality N/A N/A Tenderness on No No No Palpation: Wound Preparation: Ulcer Cleansing: N/A N/A Rinsed/Irrigated with Saline Topical Anesthetic Applied: Other: lidocaine 4% Wound Number: 5 N/A N/A Photos: No Photos N/A N/A Wound Location: Medial Sacrum N/A N/A Wounding Event: Pressure Injury N/A N/A Primary Etiology: Pressure Ulcer N/A N/A Comorbid History: N/A N/A N/A Date Acquired: 05/19/2015 N/A N/A Weeks of Treatment: 2 N/A N/A Wound Status: Open N/A N/A Measurements L x W x D 0.3x0.2x0.1 N/A N/A (cm) Area (cm) : 0.047 N/A N/A Volume (cm) : 0.005 N/A N/A % Reduction in Area: 92.00% N/A N/A % Reduction in Volume: 91.50% N/A N/A Classification: Category/Stage II N/A N/A Exudate Amount: N/A N/A N/A [name], [name] ([id number]) Wound Margin: N/A N/A N/A Granulation Amount: N/A N/A N/A Granulation Quality: N/A N/A N/A Necrotic Amount: N/A N/A N/A Exposed Structures: N/A N/A N/A Periwound Skin Texture: No Abnormalities Noted N/A N/A Periwound Skin No Abnormalities Noted N/A N/A Moisture: Periwound Skin Color: No Abnormalities Noted N/A N/A Temperature: N/A N/A N/A Tenderness on No N/A N/A Palpation: Wound Preparation: N/A N/A N/A Treatment Notes Electronic Signature(s) Signed: 06/13/2015 4:53:29 PM By: Gretta Cool, RN, BSN, Kim RN, BSN Entered By: Gretta Cool, RN, BSN, Kim on 06/13/2015 13:42:37 Tucciarone, Kobie H. ([id number]) -------------------------------------------------------------------------------- Multi-Disciplinary Care Plan Details Patient Name: [name], Kobe H. Date of Service: 06/13/2015 1:15 PM Medical Record Number:  [id number] Patient Account Number: [id number] Date of Birth/Sex: March 23, 1946 (69 y.o. Female) Treating RN: Cornell Barman Primary Care Physician: Paulita Cradle Other Clinician: Referring Physician: Paulita Cradle Treating Physician/Extender: Frann Rider in Treatment: 64 Active Inactive Abuse / Safety / Falls / Self Care Management Nursing Diagnoses: Impaired home maintenance Impaired physical mobility Knowledge deficit related to: safety; personal, health (wound), emergency Potential for falls Self care deficit: actual or potential Goals: Patient will remain injury free Date Initiated: 03/13/2015 Goal Status: Active Patient/caregiver will verbalize understanding of skin care regimen Date Initiated: 03/13/2015 Goal Status: Active Patient/caregiver will verbalize/demonstrate measure taken to improve self care Date Initiated: 03/13/2015 Goal Status: Active Patient/caregiver will verbalize/demonstrate measures taken to improve the patient's personal safety Date Initiated: 03/13/2015 Goal Status: Active Patient/caregiver will verbalize/demonstrate measures taken to prevent injury and/or falls Date Initiated: 03/13/2015 Goal Status: Active Patient/caregiver will verbalize/demonstrate understanding of what to do in case of emergency Date Initiated: 03/13/2015 Goal Status:  Active Interventions: Assess fall risk on admission and as needed Assess: immobility, friction, shearing, incontinence upon admission and as needed Assess impairment of mobility on admission and as needed per policy Assess self care needs on admission and as needed Provide education on basic hygiene [name], [name] ([id number]) Provide education on fall prevention Provide education on personal and home safety Provide education on safe transfers Treatment Activities: Education provided on Basic Hygiene : 03/13/2015 Notes: Orientation to the Wound Care Program Nursing Diagnoses: Knowledge deficit related  to the wound healing center program Goals: Patient/caregiver will verbalize understanding of the [place] Date Initiated: 03/13/2015 Goal Status: Active Interventions: Provide education on orientation to the wound center Notes: Pressure Nursing Diagnoses: Knowledge deficit related to causes and risk factors for pressure ulcer development Knowledge deficit related to management of pressures ulcers Potential for impaired tissue integrity related to pressure, friction, moisture, and shear Goals: Patient will remain free from development of additional pressure ulcers Date Initiated: 03/13/2015 Goal Status: Active Patient will remain free of pressure ulcers Date Initiated: 03/13/2015 Goal Status: Active Patient/caregiver will verbalize risk factors for pressure ulcer development Date Initiated: 03/13/2015 Goal Status: Active Patient/caregiver will verbalize understanding of pressure ulcer management Date Initiated: 03/13/2015 Goal Status: Active Interventions: Assess: immobility, friction, shearing, incontinence upon admission and as needed [name], [name] ([id number]) Assess offloading mechanisms upon admission and as needed Assess potential for pressure ulcer upon admission and as needed Provide education on pressure ulcers Treatment Activities: Patient referred for home evaluation of offloading devices/mattresses : 06/13/2015 Patient referred for pressure reduction/relief devices : 06/13/2015 Patient referred for seating evaluation to ensure proper offloading : 06/13/2015 Pressure reduction/relief device ordered : 06/13/2015 Test ordered outside of clinic : 06/13/2015 Notes: Wound/Skin Impairment Nursing Diagnoses: Impaired tissue integrity Knowledge deficit related to ulceration/compromised skin integrity Goals: Patient/caregiver will verbalize understanding of skin care regimen Date Initiated: 03/13/2015 Goal Status: Active Ulcer/skin breakdown will have a  volume reduction of 30% by week 4 Date Initiated: 03/13/2015 Goal Status: Active Ulcer/skin breakdown will have a volume reduction of 50% by week 8 Date Initiated: 03/13/2015 Goal Status: Active Ulcer/skin breakdown will have a volume reduction of 80% by week 12 Date Initiated: 03/13/2015 Goal Status: Active Ulcer/skin breakdown will heal within 14 weeks Date Initiated: 03/13/2015 Goal Status: Active Interventions: Assess patient/caregiver ability to obtain necessary supplies Assess patient/caregiver ability to perform ulcer/skin care regimen upon admission and as needed Assess ulceration(s) every visit Provide education on smoking Provide education on ulcer and skin care Treatment Activities: Patient referred to home care : 06/13/2015 DOWLER, Marisabel Lemmie Evens ([id number]) Referred to DME Sandrika Schwinn for dressing supplies : 06/13/2015 Skin care regimen initiated : 06/13/2015 Topical wound management initiated : 06/13/2015 Notes: Electronic Signature(s) Signed: 06/13/2015 4:53:29 PM By: Gretta Cool, RN, BSN, Kim RN, BSN Entered By: Gretta Cool, RN, BSN, Kim on 06/13/2015 13:42:29 Torrisi, Sheron H. ([id number]) -------------------------------------------------------------------------------- Patient/Caregiver Education Details Patient Name: [name], Shannin H. Date of Service: 06/13/2015 1:15 PM Medical Record Number: [id number] Patient Account Number: [id number] Date of Birth/Gender: 06/26/1946 (69 y.o. Female) Treating RN: Cornell Barman Primary Care Physician: Paulita Cradle Other Clinician: Referring Physician: Paulita Cradle Treating Physician/Extender: Frann Rider in Treatment: 13 Education Assessment Education Provided To: Patient Education Topics Provided Wound/Skin Impairment: Caring for Your Ulcer, Smoking and Wound Healing, Other: continue wound care as Handouts: prescribed Methods: Demonstration Responses: State content correctly Electronic Signature(s) Signed: 06/13/2015 4:53:29 PM By:  Gretta Cool, RN, BSN, Kim RN, BSN Entered By: Gretta Cool, RN, BSN, Kim on 06/13/2015 13:54:00  Wenig, June H. ([id number]) -------------------------------------------------------------------------------- Wound Assessment Details Patient Name: Peary, Eldine H. Date of Service: 06/13/2015 1:15 PM Medical Record Number: [id number] Patient Account Number: [id number] Date of Birth/Sex: December 27, 1945 (69 y.o. Female) Treating RN: Cornell Barman Primary Care Physician: Paulita Cradle Other Clinician: Referring Physician: Paulita Cradle Treating Physician/Extender: Frann Rider in Treatment: 13 Wound Status Wound Number: 2 Primary Pressure Ulcer Etiology: Wound Location: Right Elbow Wound Open Wounding Event: Gradually Appeared Status: Date Acquired: 02/25/2015 Comorbid Cataracts, Asthma, Hypertension, Type Weeks Of Treatment: 13 History: II Diabetes, Neuropathy, Received Clustered Wound: No Chemotherapy Photos Photo Uploaded By: Gretta Cool, RN, BSN, Kim on 06/13/2015 17:10:29 Wound Measurements Length: (cm) 0.5 Width: (cm) 0.3 Depth: (cm) 0.1 Area: (cm) 0.118 Volume: (cm) 0.012 % Reduction in Area: 93.7% % Reduction in Volume: 96.8% Wound Description Classification: Category/Stage III Wound Margin: Distinct, outline attached Exudate Amount: None Present Foul Odor After Cleansing: No Wound Bed Granulation Amount: Small (1-33%) Exposed Structure Granulation Quality: Red Fascia Exposed: No Necrotic Amount: Small (1-33%) Fat Layer Exposed: No Necrotic Quality: Adherent Slough Tendon Exposed: No Muscle Exposed: No Joint Exposed: No Ghosh, Becky H. ([id number]) Bone Exposed: No Limited to Skin Breakdown Periwound Skin Texture Texture Color No Abnormalities Noted: No No Abnormalities Noted: No Callus: No Atrophie Blanche: No Crepitus: No Cyanosis: No Excoriation: No Ecchymosis: No Fluctuance: No Erythema: No Friable: No Hemosiderin Staining: No Induration: No Mottled:  No Localized Edema: No Pallor: No Rash: No Rubor: No Scarring: No Temperature / Pain Moisture Temperature: No Abnormality No Abnormalities Noted: No Dry / Scaly: No Maceration: No Moist: No Wound Preparation Ulcer Cleansing: Rinsed/Irrigated with Saline Topical Anesthetic Applied: Other: lidocaine 4%, Treatment Notes Wound #2 (Right Elbow) 1. Cleansed with: Clean wound with Normal Saline 4. Dressing Applied: Aquacel Ag 5. Secondary Dressing Applied Bordered Foam Dressing Electronic Signature(s) Signed: 06/13/2015 4:53:29 PM By: Gretta Cool, RN, BSN, Kim RN, BSN Entered By: Gretta Cool, RN, BSN, Kim on 06/13/2015 13:36:18 Guile, Shilo H. ([id number]) -------------------------------------------------------------------------------- Wound Assessment Details Patient Name: Liou, Terricka H. Date of Service: 06/13/2015 1:15 PM Medical Record Number: [id number] Patient Account Number: [id number] Date of Birth/Sex: 18-Aug-1946 (69 y.o. Female) Treating RN: Cornell Barman Primary Care Physician: Paulita Cradle Other Clinician: Referring Physician: Paulita Cradle Treating Physician/Extender: Frann Rider in Treatment: 13 Wound Status Wound Number: 3 Primary Etiology: Pressure Ulcer Wound Location: Midline Back Wound Status: Open Wounding Event: Pressure Injury Date Acquired: 05/02/2015 Weeks Of Treatment: 6 Clustered Wound: No Photos Photo Uploaded By: Gretta Cool, RN, BSN, Kim on 06/13/2015 17:10:59 Wound Measurements Length: (cm) 3.5 Width: (cm) 1.6 Depth: (cm) 0.1 Area: (cm) 4.398 Volume: (cm) 0.44 % Reduction in Area: -1899.1% % Reduction in Volume: -1900% Wound Description Classification: Category/Stage II Periwound Skin Texture Texture Color No Abnormalities Noted: No No Abnormalities Noted: No Fringer, Ashby H. ([id number]) Moisture No Abnormalities Noted: No Treatment Notes Wound #3 (Midline Back) 1. Cleansed with: Clean wound with Normal Saline 4. Dressing  Applied: Santyl Ointment 5. Secondary Dressing Applied Bordered Foam Dressing Electronic Signature(s) Signed: 06/13/2015 4:53:29 PM By: Gretta Cool, RN, BSN, Kim RN, BSN Entered By: Gretta Cool, RN, BSN, Kim on 06/13/2015 13:35:31 Lemaire, Sundeep H. ([id number]) -------------------------------------------------------------------------------- Wound Assessment Details Patient Name: Chamberlain, Adrionna H. Date of Service: 06/13/2015 1:15 PM Medical Record Number: [id number] Patient Account Number: [id number] Date of Birth/Sex: 1946-09-09 (69 y.o. Female) Treating RN: Cornell Barman Primary Care Physician: Paulita Cradle Other Clinician: Referring Physician: Paulita Cradle Treating Physician/Extender: Frann Rider in Treatment: 13 Wound Status Wound Number: 4 Primary Etiology: Pressure Ulcer Wound  Location: Right Calcaneous Wound Status: Open Wounding Event: Pressure Injury Date Acquired: 05/11/2015 Weeks Of Treatment: 4 Clustered Wound: No Photos Photo Uploaded By: Gretta Cool, RN, BSN, Kim on 06/13/2015 17:11:00 Wound Measurements Length: (cm) 1.5 Width: (cm) 1.2 Depth: (cm) 0.1 Area: (cm) 1.414 Volume: (cm) 0.141 % Reduction in Area: 60.9% % Reduction in Volume: 60.9% Wound Description Classification: Category/Stage III Periwound Skin Texture Texture Color No Abnormalities Noted: No No Abnormalities Noted: No Moisture No Abnormalities Noted: No Treatment Notes Wound #4 (Right Calcaneous) 1. Cleansed with: Alderfer, Kassidee H. ([id number]) Clean wound with Normal Saline 4. Dressing Applied: Aquacel Ag 5. Secondary Dressing Applied Bordered Foam Dressing Electronic Signature(s) Signed: 06/13/2015 4:53:29 PM By: Gretta Cool, RN, BSN, Kim RN, BSN Entered By: Gretta Cool, RN, BSN, Kim on 06/13/2015 13:30:17 Tapia, Lynley H. ([id number]) -------------------------------------------------------------------------------- Wound Assessment Details Patient Name: Broxterman, Dartha H. Date of Service: 06/13/2015  1:15 PM Medical Record Number: [id number] Patient Account Number: [id number] Date of Birth/Sex: 1946/05/18 (69 y.o. Female) Treating RN: Cornell Barman Primary Care Physician: Paulita Cradle Other Clinician: Referring Physician: Paulita Cradle Treating Physician/Extender: Frann Rider in Treatment: 13 Wound Status Wound Number: 5 Primary Etiology: Pressure Ulcer Wound Location: Medial Sacrum Wound Status: Open Wounding Event: Pressure Injury Date Acquired: 05/19/2015 Weeks Of Treatment: 2 Clustered Wound: No Photos Photo Uploaded By: Gretta Cool, RN, BSN, Kim on 06/13/2015 17:11:15 Wound Measurements Length: (cm) 0.3 Width: (cm) 0.2 Depth: (cm) 0.1 Area: (cm) 0.047 Volume: (cm) 0.005 % Reduction in Area: 92% % Reduction in Volume: 91.5% Wound Description Classification: Category/Stage II Periwound Skin Texture Texture Color No Abnormalities Noted: No No Abnormalities Noted: No Moisture No Abnormalities Noted: No Treatment Notes Wound #5 (Medial Sacrum) 1. Cleansed with: Acree, Lezli H. ([id number]) Clean wound with Normal Saline 4. Dressing Applied: Aquacel Ag 5. Secondary Dressing Applied Bordered Foam Dressing Electronic Signature(s) Signed: 06/13/2015 4:53:29 PM By: Gretta Cool, RN, BSN, Kim RN, BSN Entered By: Gretta Cool, RN, BSN, Kim on 06/13/2015 13:35:31 Chisenhall, Mattie H. ([id number]) -------------------------------------------------------------------------------- Vitals Details Patient Name: [name], Lugene H. Date of Service: 06/13/2015 1:15 PM Medical Record Number: [id number] Patient Account Number: [id number] Date of Birth/Sex: Dec 31, 1945 (69 y.o. Female) Treating RN: Cornell Barman Primary Care Physician: Paulita Cradle Other Clinician: Referring Physician: Paulita Cradle Treating Physician/Extender: Frann Rider in Treatment: 13 Vital Signs Time Taken: 13:28 Temperature (F): 98.7 Height (in): 65 Pulse (bpm): 105 Weight (lbs):  118 Respiratory Rate (breaths/min): 20 Body Mass Index (BMI): 19.6 Blood Pressure (mmHg): 117/79 Reference Range: 80 - 120 mg / dl Electronic Signature(s) Signed: 06/13/2015 4:53:29 PM By: Gretta Cool, RN, BSN, Kim RN, BSN Entered By: Gretta Cool, RN, BSN, Kim on 06/13/2015 37:10:62

## 2015-06-24 ENCOUNTER — Ambulatory Visit: Payer: Self-pay

## 2015-06-24 ENCOUNTER — Other Ambulatory Visit: Payer: Self-pay

## 2015-06-27 ENCOUNTER — Encounter: Payer: Medicare Other | Attending: Surgery | Admitting: Surgery

## 2015-06-27 DIAGNOSIS — F17218 Nicotine dependence, cigarettes, with other nicotine-induced disorders: Secondary | ICD-10-CM | POA: Insufficient documentation

## 2015-06-27 DIAGNOSIS — I10 Essential (primary) hypertension: Secondary | ICD-10-CM | POA: Diagnosis not present

## 2015-06-27 DIAGNOSIS — L89613 Pressure ulcer of right heel, stage 3: Secondary | ICD-10-CM | POA: Diagnosis not present

## 2015-06-27 DIAGNOSIS — R600 Localized edema: Secondary | ICD-10-CM | POA: Insufficient documentation

## 2015-06-27 DIAGNOSIS — Z853 Personal history of malignant neoplasm of breast: Secondary | ICD-10-CM | POA: Diagnosis not present

## 2015-06-27 DIAGNOSIS — G629 Polyneuropathy, unspecified: Secondary | ICD-10-CM | POA: Insufficient documentation

## 2015-06-27 DIAGNOSIS — E11621 Type 2 diabetes mellitus with foot ulcer: Secondary | ICD-10-CM | POA: Insufficient documentation

## 2015-06-27 DIAGNOSIS — L89013 Pressure ulcer of right elbow, stage 3: Secondary | ICD-10-CM | POA: Diagnosis not present

## 2015-06-27 NOTE — Progress Notes (Signed)
Mckenzie, Shirleymae HMarland [name] ([id number]) Visit Report for 06/27/2015 Chief Complaint Document Details Patient Name: [name], [name] 06/27/2015 1:45 Date of Service: PM Medical Record [id number] Number: Patient Account Number: [id number] 1946-03-12 (69 y.o. Treating RN: Baruch Gouty, RN, BSN, Velva Harman Date of Birth/Sex: Female) Other Clinician: Primary Care Physician: Carrie Mew, MIRIAM Treating Klinton Candelas Referring Physician: Paulita Cradle Physician/Extender: Weeks in Treatment: 15 Information Obtained from: Patient Chief Complaint Patient presents to the wound care center for a consult due non healing wound. Ulcers on the right elbow and the right heel for about 1 month. Electronic Signature(s) Signed: 06/27/2015 2:32:49 PM By: Christin Fudge MD, FACS Entered By: Christin Fudge on 06/27/2015 14:32:48 Ognibene, Suzannah HMarland [name] ([id number]) -------------------------------------------------------------------------------- HPI Details Patient Name: Mckenzie Spurr. 06/27/2015 1:45 Date of Service: PM Medical Record [id number] Number: Patient Account Number: [id number] [birth year] (69 y.o. Treating RN: Baruch Gouty, RN, BSN, Velva Harman Date of Birth/Sex: Female) Other Clinician: Primary Care Physician: Carrie Mew, MIRIAM Treating Delyle Weider Referring Physician: Paulita Cradle Physician/Extender: Weeks in Treatment: 15 History of Present Illness Location: Ulceration on the right heel and the right elbow. Quality: Patient reports experiencing a dull pain to affected area(s). Severity: Patient states wound (s) are getting better. Duration: Patient has had the wound for < 4 weeks prior to presenting for treatment Timing: Pain in wound is Intermittent (comes and goes Context: The wound appeared gradually over time Modifying Factors: Consults to this date include:Augmentin and Bactrim and also some heel protection with duoderm Associated Signs and Symptoms: Patient reports having difficulty standing for long  periods. HPI Description: 69 year old female with history of peripheral neuropathy, history of diet controlled diabetes mellitus type 2, history of alcoholism here for wound consult sent by her PCP Dr. Sherilyn Cooter. She has pressure ulcers at her right elbow, bilateral heels. Plain films of right calcaneus without acute bony process. Patient started by PCP on Augmentin, Bactrim as per orders, DuoDerm dressings applied - reports some improvement in her ulcer since last seen. Denies fever, chills, nausea, vomiting, diarrhea. She had a right humerus fracture in the middle of May and has had no surgery and arm is in a sling. She is also been laying in the bed for quite a while. Past medical history significant for essential hypertension, osteoporosis, peripheral neuropathy, alcoholism, ataxia, personal history of breast cancer treated with surgery chemotherapy and radiation and this was done in December 2010. she is also status post laparoscopic cholecystectomy, pilonidal cyst excision, subcutaneous port placement, partial mastectomy on the left side, skin cancer removal. 03/21/2015 -- she says overall she's been doing better and continues to smoke about 15 cigarettes a day. 03/21/2015 - her orthopedic doctor has said she may require surgery for her right humerus fracture. 04/04/2015 -- her orthopedic surgery has been scheduled for August 11. 04/18/2015 -- she is doing fine as far as her elbow and her right heel goes but she has developed some redness over prominence on her thoracic spine and wanted me to take a look at this. 05/02/2015 -- she had her surgery done and now is in a sling and support. Her back has developed a pressure injury of undetermined stage. She seems to be in better spirits. 05/16/2015 -- last week her right heel was looking great and we had healed it out, but she has not been offloading appropriately and has a deep tissue injury on the right heel again. The area on her right elbow has  opened out with slough and the area in the thoracic spine is also getting worse. 05/30/2015 --  she has developed 2 new ulcerations one on her left ischial tuberosity and one on the sacral region. She is still working on getting up smoking but is also unable to take her vitamins as she says she Mccree, [name] ([id number]) develops a diarrhea when she takes vitamins. She has increased her intake of proteins. 06/06/2015 -- the patient's husband manages to get her a low air loss mattress with initiating pressure but did not know how to use it exactly and the patient was not happy about using it. Overall she says she's been feeling better. Electronic Signature(s) Signed: 06/27/2015 2:32:55 PM By: Christin Fudge MD, FACS Entered By: Christin Fudge on 06/27/2015 14:32:54 Bevins, Fatin Lemmie Evens ([id number]) -------------------------------------------------------------------------------- Physical Exam Details Patient Name: Mckenzie Spurr. 06/27/2015 1:45 Date of Service: PM Medical Record [id number] Number: Patient Account Number: [id number] 07/01/1946 (69 y.o. Treating RN: Baruch Gouty, RN, BSN, Velva Harman Date of Birth/Sex: Female) Other Clinician: Primary Care Physician: Carrie Mew, MIRIAM Treating Kamil Mchaffie Referring Physician: Carrie Mew, MIRIAM Physician/Extender: Weeks in Treatment: 15 Constitutional . Pulse regular. Respirations normal and unlabored. Afebrile. . Eyes Nonicteric. Reactive to light. Ears, Nose, Mouth, and Throat Lips, teeth, and gums WNL.Marland [name] Moist mucosa without lesions . Neck supple and nontender. No palpable supraclavicular or cervical adenopathy. Normal sized without goiter. Respiratory WNL. No retractions.. Cardiovascular Pedal Pulses WNL. No clubbing, cyanosis or edema. Lymphatic No adneopathy. No adenopathy. No adenopathy. Musculoskeletal Adexa without tenderness or enlargement.. Digits and nails w/o clubbing, cyanosis, infection, petechiae, ischemia, or inflammatory  conditions.. Integumentary (Hair, Skin) No suspicious lesions. No crepitus or fluctuance. No peri-wound warmth or erythema. No masses.Marland [name] Psychiatric Judgement and insight Intact.. No evidence of depression, anxiety, or agitation.. Notes The heel and the right elbow wound are looking better and we will continue with local care. The thoracic spine region is now a full-thickness defect and is not separating easily with Santyl. standard touch but there is no evidence of cellulitis or inflammation. Electronic Signature(s) Signed: 06/27/2015 2:34:12 PM By: Christin Fudge MD, FACS Entered By: Christin Fudge on 06/27/2015 14:34:12 Dunnigan, Willella Lemmie Evens ([id number]) -------------------------------------------------------------------------------- Physician Orders Details Patient Name: Mckenzie Spurr. 06/27/2015 1:45 Date of Service: PM Medical Record [id number] Number: Patient Account Number: [id number] 1946-02-17 (69 y.o. Treating RN: Baruch Gouty, RN, BSN, Velva Harman Date of Birth/Sex: Female) Other Clinician: Primary Care Physician: Carrie Mew, MIRIAM Treating Philomina Leon Referring Physician: Paulita Cradle Physician/Extender: Suella [place] in Treatment: 15 Verbal / Phone Orders: Yes Clinician: Afful, RN, BSN, Rita Read Back and Verified: Yes Diagnosis Coding Wound Cleansing Wound #2 Right Elbow o Clean wound with Normal Saline. o May Shower, gently pat wound dry prior to applying new dressing. Wound #3 Midline Back o Clean wound with Normal Saline. o May Shower, gently pat wound dry prior to applying new dressing. Wound #4 Right Calcaneous o Clean wound with Normal Saline. o May Shower, gently pat wound dry prior to applying new dressing. Anesthetic Wound #2 Right Elbow o Topical Lidocaine 4% cream applied to wound bed prior to debridement Wound #4 Right Calcaneous o Topical Lidocaine 4% cream applied to wound bed prior to debridement Skin Barriers/Peri-Wound Care Wound #3 Midline  Back o Barrier cream Primary Wound Dressing Wound #2 Right Elbow o Aquacel Ag Wound #3 Midline Back o Santyl Ointment Wound #4 Right Calcaneous o Aquacel Ag Karner, Karess H. ([id number]) Secondary Dressing Wound #2 Right Elbow o Boardered Foam Dressing Wound #3 Midline Back o Boardered Foam Dressing Wound #4 Right Calcaneous o Boardered Foam Dressing Dressing Change Frequency Wound #2 Right Elbow o  Change dressing every other day. Wound #3 Midline Back o Change dressing every day. Wound #4 Right Calcaneous o Change dressing every other day. Follow-up Appointments Wound #2 Right Elbow o Return Appointment in 1 week. Wound #3 Midline Back o Return Appointment in 1 week. Wound #4 Right Calcaneous o Return Appointment in 1 week. Off-Loading Wound #2 Right Elbow o Heel suspension boot to: - Sage boots heels o Turn and reposition every 2 hours o Mattress - patient refuses use of air mattress Wound #3 Midline Back o Heel suspension boot to: - Sage boots heels o Turn and reposition every 2 hours o Mattress - patient refuses use of air mattress Wound #4 Right Calcaneous o Heel suspension boot to: - Sage boots heels o Turn and reposition every 2 hours o Mattress - patient refuses use of air mattress Home Health Wound #2 Right Elbow Polidore, Rakiyah H. ([id number]) o [place] Nurse may visit PRN to address patientos wound care needs. o FACE TO FACE ENCOUNTER: MEDICARE and MEDICAID PATIENTS: I certify that this patient is under my care and that I had a face-to-face encounter that meets the physician face-to-face encounter requirements with this patient on this date. The encounter with the patient was in whole or in part for the following MEDICAL CONDITION: (primary reason for [place]) MEDICAL NECESSITY: I certify, that based on my findings, NURSING services are a medically necessary home  health service. HOME BOUND STATUS: I certify that my clinical findings support that this patient is homebound (i.e., Due to illness or injury, pt requires aid of supportive devices such as crutches, cane, wheelchairs, walkers, the use of special transportation or the assistance of another person to leave their place of residence. There is a normal inability to leave the home and doing so requires considerable and taxing effort. Other absences are for medical reasons / religious services and are infrequent or of short duration when for other reasons). o If current dressing causes regression in wound condition, may D/C ordered dressing product/s and apply Normal Saline Moist Dressing daily until next [place] / Other MD appointment. [place] of regression in wound condition at [phone]. o Please direct any NON-WOUND related issues/requests for orders to patient's Primary Care Physician Wound #3 Midline Back o [place] Nurse may visit PRN to address patientos wound care needs. o FACE TO FACE ENCOUNTER: MEDICARE and MEDICAID PATIENTS: I certify that this patient is under my care and that I had a face-to-face encounter that meets the physician face-to-face encounter requirements with this patient on this date. The encounter with the patient was in whole or in part for the following MEDICAL CONDITION: (primary reason for [place]) MEDICAL NECESSITY: I certify, that based on my findings, NURSING services are a medically necessary home health service. HOME BOUND STATUS: I certify that my clinical findings support that this patient is homebound (i.e., Due to illness or injury, pt requires aid of supportive devices such as crutches, cane, wheelchairs, walkers, the use of special transportation or the assistance of another person to leave their place of residence. There is a normal inability to leave the home and doing  so requires considerable and taxing effort. Other absences are for medical reasons / religious services and are infrequent or of short duration when for other reasons). o If current dressing causes regression in wound condition, may D/C ordered dressing product/s and apply Normal Saline Moist Dressing daily  until next [place] / Other MD appointment. [place] of regression in wound condition at [phone]. o Please direct any NON-WOUND related issues/requests for orders to patient's Primary Care Physician Wound #4 Right [place] Nurse may visit PRN to address patientos wound care needs. o FACE TO FACE ENCOUNTER: MEDICARE and MEDICAID PATIENTS: I certify that this patient is under my care and that I had a face-to-face encounter that meets the physician face-to-face encounter requirements with this patient on this date. The encounter with the patient was in whole or in part for the following MEDICAL CONDITION: (primary reason for [place]) MEDICAL NECESSITY: I certify, that based on my findings, NURSING services are a medically necessary home health service. HOME BOUND STATUS: I certify that my clinical findings Bosak, Caroleann H. ([id number]) support that this patient is homebound (i.e., Due to illness or injury, pt requires aid of supportive devices such as crutches, cane, wheelchairs, walkers, the use of special transportation or the assistance of another person to leave their place of residence. There is a normal inability to leave the home and doing so requires considerable and taxing effort. Other absences are for medical reasons / religious services and are infrequent or of short duration when for other reasons). o If current dressing causes regression in wound condition, may D/C ordered dressing product/s and apply Normal Saline Moist Dressing daily until next [place] / Other  MD appointment. [place] of regression in wound condition at [phone]. o Please direct any NON-WOUND related issues/requests for orders to patient's Primary Care Physician Electronic Signature(s) Signed: 06/27/2015 2:22:20 PM By: Regan Lemming BSN, RN Signed: 06/27/2015 4:02:00 PM By: Christin Fudge MD, FACS Entered By: Regan Lemming on 06/27/2015 14:22:20 Helmes, Errica Lemmie Evens ([id number]) -------------------------------------------------------------------------------- Problem List Details Patient Name: Mckenzie Spurr. 06/27/2015 1:45 Date of Service: PM Medical Record [id number] Number: Patient Account Number: [id number] 23-Dec-1945 (69 y.o. Treating RN: Afful, RN, BSN, Velva Harman Date of Birth/Sex: Female) Other Clinician: Primary Care Physician: Carrie Mew, MIRIAM Treating Denisha Hoel Referring Physician: Paulita Cradle Physician/Extender: Weeks in Treatment: 15 Active Problems ICD-10 Encounter Code Description Active Date Diagnosis E11.621 Type 2 diabetes mellitus with foot ulcer 03/13/2015 Yes L89.613 Pressure ulcer of right heel, stage 3 03/13/2015 Yes L89.013 Pressure ulcer of right elbow, stage 3 03/13/2015 Yes F17.218 Nicotine dependence, cigarettes, with other nicotine- 03/13/2015 Yes induced disorders L89.100 Pressure ulcer of unspecified part of back, unstageable 05/02/2015 Yes L89.222 Pressure ulcer of left hip, stage 2 05/30/2015 Yes L89.153 Pressure ulcer of sacral region, stage 3 05/30/2015 Yes Inactive Problems Resolved Problems Electronic Signature(s) Signed: 06/27/2015 2:32:40 PM By: Christin Fudge MD, FACS Entered By: Christin Fudge on 06/27/2015 14:32:40 Homer, Jahara H. ([id number]) -------------------------------------------------------------------------------- Progress Note Details Patient Name: Mckenzie Spurr. 06/27/2015 1:45 Date of Service: PM Medical Record [id number] Number: Patient Account Number: [id number] 1945/12/28 (69 y.o. Treating  RN: Baruch Gouty, RN, BSN, Velva Harman Date of Birth/Sex: Female) Other Clinician: Primary Care Physician: Carrie Mew, MIRIAM Treating Adreonna Yontz Referring Physician: Paulita Cradle Physician/Extender: Suella Grove in Treatment: 15 Subjective Chief Complaint Information obtained from Patient Patient presents to the wound care center for a consult due non healing wound. Ulcers on the right elbow and the right heel for about 1 month. History of Present Illness (HPI) The following HPI elements were documented for the patient's wound: Location: Ulceration on the right heel and the right elbow. Quality: Patient reports experiencing a dull pain to affected area(s). Severity: Patient  states wound (s) are getting better. Duration: Patient has had the wound for < 4 weeks prior to presenting for treatment Timing: Pain in wound is Intermittent (comes and goes Context: The wound appeared gradually over time Modifying Factors: Consults to this date include:Augmentin and Bactrim and also some heel protection with duoderm Associated Signs and Symptoms: Patient reports having difficulty standing for long periods. 69 year old female with history of peripheral neuropathy, history of diet controlled diabetes mellitus type 2, history of alcoholism here for wound consult sent by her PCP Dr. Sherilyn Cooter. She has pressure ulcers at her right elbow, bilateral heels. Plain films of right calcaneus without acute bony process. Patient started by PCP on Augmentin, Bactrim as per orders, DuoDerm dressings applied - reports some improvement in her ulcer since last seen. Denies fever, chills, nausea, vomiting, diarrhea. She had a right humerus fracture in the middle of May and has had no surgery and arm is in a sling. She is also been laying in the bed for quite a while. Past medical history significant for essential hypertension, osteoporosis, peripheral neuropathy, alcoholism, ataxia, personal history of breast cancer treated with  surgery chemotherapy and radiation and this was done in December 2010. she is also status post laparoscopic cholecystectomy, pilonidal cyst excision, subcutaneous port placement, partial mastectomy on the left side, skin cancer removal. 03/21/2015 -- she says overall she's been doing better and continues to smoke about 15 cigarettes a day. 03/21/2015 - her orthopedic doctor has said she may require surgery for her right humerus fracture. 04/04/2015 -- her orthopedic surgery has been scheduled for August 11. 04/18/2015 -- she is doing fine as far as her elbow and her right heel goes but she has developed some Lord, Avira H. ([id number]) redness over prominence on her thoracic spine and wanted me to take a look at this. 05/02/2015 -- she had her surgery done and now is in a sling and support. Her back has developed a pressure injury of undetermined stage. She seems to be in better spirits. 05/16/2015 -- last week her right heel was looking great and we had healed it out, but she has not been offloading appropriately and has a deep tissue injury on the right heel again. The area on her right elbow has opened out with slough and the area in the thoracic spine is also getting worse. 05/30/2015 -- she has developed 2 new ulcerations one on her left ischial tuberosity and one on the sacral region. She is still working on getting up smoking but is also unable to take her vitamins as she says she develops a diarrhea when she takes vitamins. She has increased her intake of proteins. 06/06/2015 -- the patient's husband manages to get her a low air loss mattress with initiating pressure but did not know how to use it exactly and the patient was not happy about using it. Overall she says she's been feeling better. Objective Constitutional Pulse regular. Respirations normal and unlabored. Afebrile. Vitals Time Taken: 2:04 PM, Height: 65 in, Weight: 118 lbs, BMI: 19.6, Temperature: 97.8 F,  Respiratory Rate: 17 breaths/min. Eyes Nonicteric. Reactive to light. Ears, Nose, Mouth, and Throat Lips, teeth, and gums WNL.Marland [name] Moist mucosa without lesions . Neck supple and nontender. No palpable supraclavicular or cervical adenopathy. Normal sized without goiter. Respiratory WNL. No retractions.. Cardiovascular Pedal Pulses WNL. No clubbing, cyanosis or edema. Lymphatic No adneopathy. No adenopathy. No adenopathy. Musculoskeletal Adexa without tenderness or enlargement.. Digits and nails w/o clubbing, cyanosis, infection, petechiae, ischemia, or inflammatory conditions.Marland [name]  Putman, Naryah H. ([id number]) Psychiatric Judgement and insight Intact.. No evidence of depression, anxiety, or agitation.. General Notes: The heel and the right elbow wound are looking better and we will continue with local care. The thoracic spine region is now a full-thickness defect and is not separating easily with Santyl. standard touch but there is no evidence of cellulitis or inflammation. Integumentary (Hair, Skin) No suspicious lesions. No crepitus or fluctuance. No peri-wound warmth or erythema. No masses.. Wound #2 status is Open. Original cause of wound was Gradually Appeared. The wound is located on the Right Elbow. The wound measures 0.5cm length x 0.5cm width x 0.1cm depth; 0.196cm^2 area and 0.02cm^3 volume. The wound is limited to skin breakdown. There is no tunneling or undermining noted. There is a small amount of serosanguineous drainage noted. The wound margin is distinct with the outline attached to the wound base. There is small (1-33%) red granulation within the wound bed. There is a small (1-33%) amount of necrotic tissue within the wound bed including Adherent Slough. The periwound skin appearance exhibited: Moist. The periwound skin appearance did not exhibit: Callus, Crepitus, Excoriation, Fluctuance, Friable, Induration, Localized Edema, Rash, Scarring, Dry/Scaly, Maceration, Atrophie  Blanche, Cyanosis, Ecchymosis, Hemosiderin Staining, Mottled, Pallor, Rubor, Erythema. Periwound temperature was noted as No Abnormality. Wound #3 status is Open. Original cause of wound was Pressure Injury. The wound is located on the Midline Back. The wound measures 4.5cm length x 2.9cm width x 0.1cm depth; 10.249cm^2 area and 1.025cm^3 volume. The wound is limited to skin breakdown. There is no tunneling or undermining noted. There is a medium amount of serosanguineous drainage noted. The wound margin is distinct with the outline attached to the wound base. There is small (1-33%) granulation within the wound bed. There is a large (67-100%) amount of necrotic tissue within the wound bed including Adherent Slough. The periwound skin appearance exhibited: Moist. The periwound skin appearance did not exhibit: Callus, Crepitus, Excoriation, Fluctuance, Friable, Induration, Localized Edema, Rash, Scarring, Dry/Scaly, Maceration, Atrophie Blanche, Cyanosis, Ecchymosis, Hemosiderin Staining, Mottled, Pallor, Rubor, Erythema. Periwound temperature was noted as No Abnormality. Wound #4 status is Open. Original cause of wound was Pressure Injury. The wound is located on the Right Calcaneous. The wound measures 1.2cm length x 0.8cm width x 0.1cm depth; 0.754cm^2 area and 0.075cm^3 volume. The wound is limited to skin breakdown. There is no tunneling or undermining noted. There is a medium amount of serosanguineous drainage noted. The wound margin is distinct with the outline attached to the wound base. There is medium (34-66%) granulation within the wound bed. There is a small (1-33%) amount of necrotic tissue within the wound bed including Adherent Slough. The periwound skin appearance exhibited: Moist. The periwound skin appearance did not exhibit: Callus, Crepitus, Excoriation, Fluctuance, Friable, Induration, Localized Edema, Rash, Scarring, Dry/Scaly, Maceration, Atrophie Blanche, Cyanosis,  Ecchymosis, Hemosiderin Staining, Mottled, Pallor, Rubor, Erythema. Periwound temperature was noted as No Abnormality. The periwound has tenderness on palpation. Wound #5 status is Healed - Epithelialized. Original cause of wound was Pressure Injury. The wound is located on the Medial Sacrum. The wound measures 0cm length x 0cm width x 0cm depth; 0cm^2 area and 0cm^3 volume. The wound is limited to skin breakdown. There is no tunneling or undermining noted. There is a none present amount of drainage noted. The wound margin is distinct with the outline attached to the wound base. There is no granulation within the wound bed. There is no necrotic tissue within the wound bed. The periwound skin appearance  exhibited: Dry/Scaly. The periwound skin appearance did not exhibit: Callus, Crepitus, Excoriation, Fluctuance, Friable, Induration, Localized Edema, Rash, Scarring, Fausto, Katriel H. ([id number]) Maceration, Moist, Atrophie Blanche, Cyanosis, Ecchymosis, Hemosiderin Staining, Mottled, Pallor, Rubor, Erythema. Periwound temperature was noted as No Abnormality. 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 After adequate granulation is done we can think of a plastic surgery opinion if need be. We will continue to treat locally with Santyl for now and on the heel and elbow he will use silver alginate and an appropriate offloading foam. The patient and her husband would like to see Dr. Ananias Pilgrim  for a surgical opinion and we will make the referral. Plan Wound Cleansing: Wound #2 Right Elbow: Clean wound with Normal Saline. May Shower, gently pat wound dry prior to applying new dressing. Wound #3 Midline Back: Clean wound with Normal Saline. May Shower, gently pat wound dry prior to applying new dressing. Wound #4 Right Calcaneous: Clean wound with Normal Saline. May Shower, gently pat wound dry prior to applying new dressing. Anesthetic: Wound #2 Right Elbow: Topical Lidocaine 4% cream applied to wound bed prior to debridement Lampe, Ronetta H. ([id number]) Wound #4 Right Calcaneous: Topical Lidocaine 4% cream applied to wound bed prior to debridement Skin Barriers/Peri-Wound Care: Wound #3 Midline Back: Barrier cream Primary Wound Dressing: Wound #2 Right Elbow: Aquacel Ag Wound #3 Midline Back: Santyl Ointment Wound #4 Right Calcaneous: Aquacel Ag Secondary Dressing: Wound #2 Right Elbow: Boardered Foam Dressing Wound #3 Midline Back: Boardered Foam Dressing Wound #4 Right Calcaneous: Boardered Foam Dressing Dressing Change Frequency: Wound #2 Right Elbow: Change dressing every other day. Wound #3 Midline Back: Change dressing every day. Wound #4 Right Calcaneous: Change dressing every other day. Follow-up Appointments: Wound #2 Right Elbow: Return Appointment in 1 week. Wound #3 Midline Back: Return Appointment in 1 week. Wound #4 Right Calcaneous: Return Appointment in 1 week. Off-Loading: Wound #2 Right Elbow: Heel suspension boot to: - Sage boots heels Turn and reposition every 2 hours Mattress - patient refuses use of air mattress Wound #3 Midline Back: Heel suspension boot to: - Sage boots heels Turn and reposition every 2 hours Mattress - patient refuses use of air mattress Wound #4 Right Calcaneous: Heel suspension boot to: - Sage boots heels Turn and reposition every 2 hours Mattress - patient refuses use of air mattress Home  Health: Wound #2 Right Elbow: [place] Nurse may visit PRN to address patient s wound care needs. FACE TO FACE ENCOUNTER: MEDICARE and MEDICAID PATIENTS: I certify that this patient is under Martinez, Daneille H. ([id number]) my care and that I had a face-to-face encounter that meets the physician face-to-face encounter requirements with this patient on this date. The encounter with the patient was in whole or in part for the following MEDICAL CONDITION: (primary reason for [place]) MEDICAL NECESSITY: I certify, that based on my findings, NURSING services are a medically necessary home health service. HOME BOUND STATUS: I certify that my clinical findings support  that this patient is homebound (i.e., Due to illness or injury, pt requires aid of supportive devices such as crutches, cane, wheelchairs, walkers, the use of special transportation or the assistance of another person to leave their place of residence. There is a normal inability to leave the home and doing so requires considerable and taxing effort. Other absences are for medical reasons / religious services and are infrequent or of short duration when for other reasons). If current dressing causes regression in wound condition, may D/C ordered dressing product/s and apply Normal Saline Moist Dressing daily until next [place] / Other MD appointment. [place] of regression in wound condition at [phone]. Please direct any NON-WOUND related issues/requests for orders to patient's Primary Care Physician Wound #3 Midline Back: [place] Nurse may visit PRN to address patient s wound care needs. FACE TO FACE ENCOUNTER: MEDICARE and MEDICAID PATIENTS: I certify that this patient is under my care and that I had a face-to-face encounter that meets the physician face-to-face encounter requirements with this patient on this date. The encounter  with the patient was in whole or in part for the following MEDICAL CONDITION: (primary reason for [place]) MEDICAL NECESSITY: I certify, that based on my findings, NURSING services are a medically necessary home health service. HOME BOUND STATUS: I certify that my clinical findings support that this patient is homebound (i.e., Due to illness or injury, pt requires aid of supportive devices such as crutches, cane, wheelchairs, walkers, the use of special transportation or the assistance of another person to leave their place of residence. There is a normal inability to leave the home and doing so requires considerable and taxing effort. Other absences are for medical reasons / religious services and are infrequent or of short duration when for other reasons). If current dressing causes regression in wound condition, may D/C ordered dressing product/s and apply Normal Saline Moist Dressing daily until next [place] / Other MD appointment. [place] of regression in wound condition at [phone]. Please direct any NON-WOUND related issues/requests for orders to patient's Primary Care Physician Wound #4 Right Calcaneous: [place] Nurse may visit PRN to address patient s wound care needs. FACE TO FACE ENCOUNTER: MEDICARE and MEDICAID PATIENTS: I certify that this patient is under my care and that I had a face-to-face encounter that meets the physician face-to-face encounter requirements with this patient on this date. The encounter with the patient was in whole or in part for the following MEDICAL CONDITION: (primary reason for [place]) MEDICAL NECESSITY: I certify, that based on my findings, NURSING services are a medically necessary home health service. HOME BOUND STATUS: I certify that my clinical findings support that this patient is homebound (i.e., Due to illness or injury, pt requires aid of supportive devices such  as crutches, cane, wheelchairs, walkers, the use of special transportation or the assistance of another person to leave their place of residence. There is a normal inability to leave the home and doing so requires considerable and taxing effort. Other absences are for medical reasons / religious services and are infrequent or of short duration when for other reasons). If current dressing causes regression in wound condition, may D/C ordered dressing product/s and apply Normal Saline Moist Dressing daily until next [place] / Other MD appointment. [place] of regression in wound condition at [phone]. Please direct any NON-WOUND related issues/requests for  orders to patient's Primary Care Physician Willeford, Ranger. ([id number]) At this stage I have recommended that she needs a surgical opinion for debridement in the OR, and application of 4 wound VAC. After adequate granulation is done we can think of a plastic surgery opinion if need be. We will continue to treat locally with Santyl for now and on the heel and elbow he will use silver alginate and an appropriate offloading foam. The patient and her husband would like to see Dr. Ananias Pilgrim for a surgical opinion and we will make the referral. Electronic Signature(s) Signed: 06/27/2015 2:35:40 PM By: Christin Fudge MD, FACS Entered By: Christin Fudge on 06/27/2015 14:35:39 Ibbotson, Aashika H. ([id number]) -------------------------------------------------------------------------------- SuperBill Details Patient Name: [name], Edlyn H. Date of Service: 06/27/2015 Medical Record Patient Account Number: [id number] Number: Afful, RN, BSN, Treating RN: September 16, 1945 (69 y.o. 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, stage 3 F17.218 Nicotine dependence, cigarettes, with other nicotine-induced disorders L89.100 Pressure ulcer of unspecified part of back, unstageable L89.222 Pressure ulcer of left hip, stage 2 L89.153 Pressure ulcer of sacral region, stage 3 Facility Procedures CPT4 Code: [id number] Description: 99214 - WOUND CARE VISIT-LEV 4 EST PT Modifier: Quantity: 1 Physician Procedures CPT4 Code: [id number] Description: 16384 - WC PHYS LEVEL 3 - EST PT ICD-10 Description Diagnosis 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.013 Pressure ulcer of right elbow, stage 3 L89.100 Pressure ulcer of unspecified part of back, uns Modifier: tageable Quantity: 1 Electronic Signature(s) Signed: 06/27/2015 2:36:09 PM By: Christin Fudge MD, FACS Entered By: Christin Fudge on 06/27/2015 14:36:09

## 2015-06-27 NOTE — Progress Notes (Signed)
[name], [name] ([id number]) Visit Report for 06/27/2015 Arrival Information Details Patient Name: [name], [name] Date of Service: 06/27/2015 1:45 PM Medical Record Number: [id number] Patient Account Number: [id number] Date of Birth/Sex: Apr 15, 1946 (69 y.o. Female) Treating RN: Afful, RN, BSN, Velva Harman Primary Care Physician: Paulita Cradle Other Clinician: Referring Physician: Paulita Cradle Treating Physician/Extender: Frann Rider in Treatment: 15 Visit Information History Since Last Visit Any new allergies or adverse reactions: No Patient Arrived: Gilford Rile Had a fall or experienced change in No Arrival Time: 13:59 activities of daily living that may affect Accompanied By: spouse risk of falls: Transfer Assistance: None Signs or symptoms of abuse/neglect since last No Patient Identification Verified: Yes visito Secondary Verification Process Completed: Yes Hospitalized since last visit: No Patient Requires Transmission-Based No Has Dressing in Place as Prescribed: Yes Precautions: Pain Present Now: No Patient Has Alerts: No Electronic Signature(s) Signed: 06/27/2015 1:59:43 PM By: Regan Lemming BSN, RN Entered By: Regan Lemming on 06/27/2015 13:59:43 Merriman, Marlina H. ([id number]) -------------------------------------------------------------------------------- Clinic Level of Care Assessment Details Patient Name: [name], [name] Date of Service: 06/27/2015 1:45 PM Medical Record Number: [id number] Patient Account Number: [id number] Date of Birth/Sex: 12-25-1945 (69 y.o. Female) Treating RN: Afful, RN, BSN, [place] Primary Care Physician: Paulita Cradle Other Clinician: Referring Physician: Paulita Cradle Treating Physician/Extender: Frann Rider in Treatment: 15 Clinic Level of Care Assessment Items TOOL 4 Quantity Score []  - Use when only an EandM is performed on FOLLOW-UP visit 0 ASSESSMENTS - Nursing Assessment / Reassessment X - Reassessment of  Co-morbidities (includes updates in patient status) 1 10 X - Reassessment of Adherence to Treatment Plan 1 5 ASSESSMENTS - Wound and Skin Assessment / Reassessment []  - Simple Wound Assessment / Reassessment - one wound 0 X - Complex Wound Assessment / Reassessment - multiple wounds 3 5 []  - Dermatologic / Skin Assessment (not related to wound area) 0 ASSESSMENTS - Focused Assessment []  - Circumferential Edema Measurements - multi extremities 0 []  - Nutritional Assessment / Counseling / Intervention 0 []  - Lower Extremity Assessment (monofilament, tuning fork, pulses) 0 []  - Peripheral Arterial Disease Assessment (using hand held doppler) 0 ASSESSMENTS - Ostomy and/or Continence Assessment and Care []  - Incontinence Assessment and Management 0 []  - Ostomy Care Assessment and Management (repouching, etc.) 0 PROCESS - Coordination of Care X - Simple Patient / Family Education for ongoing care 1 15 []  - Complex (extensive) Patient / Family Education for ongoing care 0 []  - Staff obtains Programmer, systems, Records, Test Results / Process Orders 0 []  - Staff telephones HHA, Nursing Homes / Clarify orders / etc 0 []  - Routine Transfer to another Facility (non-emergent condition) 0 Reihl, Sayana H. ([id number]) []  - Routine Hospital Admission (non-emergent condition) 0 []  - New Admissions / Biomedical engineer / Ordering NPWT, Apligraf, etc. 0 []  - Emergency Hospital Admission (emergent condition) 0 []  - Simple Discharge Coordination 0 []  - Complex (extensive) Discharge Coordination 0 PROCESS - Special Needs []  - Pediatric / Minor Patient Management 0 []  - Isolation Patient Management 0 []  - Hearing / Language / Visual special needs 0 []  - Assessment of Community assistance (transportation, D/C planning, etc.) 0 []  - Additional assistance / Altered mentation 0 []  - Support Surface(s) Assessment (bed, cushion, seat, etc.) 0 INTERVENTIONS - Wound Cleansing / Measurement []  - Simple Wound Cleansing  - one wound 0 X - Complex Wound Cleansing - multiple wounds 3 5 X - Wound Imaging (photographs - any number of wounds) 1 5 []  - Wound Tracing (  instead of photographs) 0 []  - Simple Wound Measurement - one wound 0 X - Complex Wound Measurement - multiple wounds 3 5 INTERVENTIONS - Wound Dressings []  - Small Wound Dressing one or multiple wounds 0 X - Medium Wound Dressing one or multiple wounds 3 15 []  - Large Wound Dressing one or multiple wounds 0 []  - Application of Medications - topical 0 []  - Application of Medications - injection 0 INTERVENTIONS - Miscellaneous []  - External ear exam 0 Lopp, Khayla H. ([id number]) []  - Specimen Collection (cultures, biopsies, blood, body fluids, etc.) 0 []  - Specimen(s) / Culture(s) sent or taken to Lab for analysis 0 []  - Patient Transfer (multiple staff / [name] / Similar devices) 0 []  - Simple Staple / Suture removal (25 or less) 0 []  - Complex Staple / Suture removal (26 or more) 0 []  - Hypo / Hyperglycemic Management (close monitor of Blood Glucose) 0 []  - Ankle / Brachial Index (ABI) - do not check if billed separately 0 X - Vital Signs 1 5 Has the patient been seen at the hospital within the last three years: Yes Total Score: 130 Level Of Care: New/Established - Level 4 Electronic Signature(s) Signed: 06/27/2015 2:23:04 PM By: Regan Lemming BSN, RN Entered By: Regan Lemming on 06/27/2015 14:23:04 Emberton, Mohini H. ([id number]) -------------------------------------------------------------------------------- Encounter Discharge Information Details Patient Name: [name], Kaedence H. Date of Service: 06/27/2015 1:45 PM Medical Record Number: [id number] Patient Account Number: [id number] Date of Birth/Sex: 1946-02-15 (69 y.o. Female) Treating RN: Baruch Gouty, RN, BSN, Velva Harman Primary Care Physician: Paulita Cradle Other Clinician: Referring Physician: Paulita Cradle Treating Physician/Extender: Frann Rider in Treatment: 15 Encounter  Discharge Information Items Discharge Pain Level: 0 Discharge Condition: Stable Ambulatory Status: Walker Discharge Destination: Home Private Transportation: Auto Accompanied By: spouse Schedule Follow-up Appointment: No Medication Reconciliation completed and No provided to Patient/Care Nakyah Erdmann: Clinical Summary of Care: Electronic Signature(s) Signed: 06/27/2015 2:24:30 PM By: Regan Lemming BSN, RN Entered By: Regan Lemming on 06/27/2015 14:24:28 Belisle, Siena H. ([id number]) -------------------------------------------------------------------------------- Lower Extremity Assessment Details Patient Name: Happel, Tanasha H. Date of Service: 06/27/2015 1:45 PM Medical Record Number: [id number] Patient Account Number: [id number] Date of Birth/Sex: 19-Dec-1945 (69 y.o. Female) Treating RN: Afful, RN, BSN, Velva Harman Primary Care Physician: Paulita Cradle Other Clinician: Referring Physician: Paulita Cradle Treating Physician/Extender: Frann Rider in Treatment: 15 Vascular Assessment Pulses: Posterior Tibial Dorsalis Pedis Palpable: [Right:Yes] Extremity colors, hair growth, and conditions: Extremity Color: [Right:Normal] Hair Growth on Extremity: [Right:No] Temperature of Extremity: [Right:Warm] Capillary Refill: [Right:< 3 seconds] Toe Nail Assessment Left: Right: Thick: No Discolored: No Deformed: No Improper Length and Hygiene: No Electronic Signature(s) Signed: 06/27/2015 2:30:39 PM By: Regan Lemming BSN, RN Entered By: Regan Lemming on 06/27/2015 14:30:39 Clos, Adaya H. ([id number]) -------------------------------------------------------------------------------- Multi Wound Chart Details Patient Name: [name], Aarini H. Date of Service: 06/27/2015 1:45 PM Medical Record Number: [id number] Patient Account Number: [id number] Date of Birth/Sex: 07-15-1946 (69 y.o. 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] [3:Pressure Ulcer] [4:Pressure Ulcer] Comorbid History: [2:Cataracts, Asthma, Hypertension, Type II Diabetes, Neuropathy, Received Chemotherapy] [3:Cataracts, Asthma, Hypertension, Type II Diabetes, Neuropathy, Received Chemotherapy] [4:Cataracts, Asthma, Hypertension, Type II Diabetes,  Neuropathy, Received Chemotherapy] Date Acquired: [2:02/25/2015] [3:05/02/2015] [4:05/11/2015] Weeks of Treatment: [2:15] [3:8] [4:6]  Wound Status: [2:Open] [3:Open] [4:Open] Measurements L x W x D 0.5x0.5x0.1 [3:4.5x2.9x0.1] [4:1.2x0.8x0.1] (cm) Area (cm) : [2:0.196] [3:10.249] [4:0.754] Volume (cm) : [2:0.02] [3:1.025] [4:0.075] % Reduction in Area: [2:89.60%] [3:-4558.60%] [4:79.10%] % Reduction in Volume: 94.70% [3:-4559.10%] [4:79.20%] Classification: [2:Category/Stage III] [3:Category/Stage II] [4:Category/Stage III] HBO Classification: [2:N/A] [3:N/A] [4:Grade 1] Exudate Amount: [2:Small] [3:Medium] [4:Medium] Exudate Type: [2:Serosanguineous] [3:Serosanguineous] [4:Serosanguineous] Exudate Color: [2:red, brown] [3:red, brown] [4:red, brown] Wound Margin: [2:Distinct, outline attached] [3:Distinct, outline attached] [4:Distinct, outline attached] Granulation Amount: [2:Small (1-33%)] [3:Small (1-33%)] [4:Medium (34-66%)] Granulation Quality: [2:Red] [3:N/A] [4:N/A] Necrotic Amount: [2:Small (1-33%)] [3:Large (67-100%)] [4:Small (1-33%)] Exposed Structures: [2:Fascia: No Fat: No Tendon: No Muscle: No Joint: No Bone: No Limited to Skin Breakdown] [3:Fascia: No Fat: No Tendon: No Muscle: No Joint: No Bone: No Limited to Skin Breakdown] [4:Fascia: No  Fat: No Tendon: No Muscle: No Joint: No Bone: No Limited to  Skin Breakdown] Epithelialization: [2:N/A] [3:N/A] [4:N/A] Periwound Skin Texture: Edema: No [2:Excoriation: No Induration: No] [3:Edema: No Excoriation: No Induration: No] [4:Edema: No Excoriation: No Induration: No] Callus: No Callus: No Callus: No Crepitus: No Crepitus: No Crepitus: No Fluctuance: No Fluctuance: No Fluctuance: No Friable: No Friable: No Friable: No Rash: No Rash: No Rash: No Scarring: No Scarring: No Scarring: No Periwound Skin Moist: Yes Moist: Yes Moist: Yes Moisture: Maceration: No Maceration: No Maceration: No Dry/Scaly: No Dry/Scaly: No Dry/Scaly: No Periwound Skin Color: Atrophie Blanche: No Atrophie Blanche: No Atrophie Blanche: No Cyanosis: No Cyanosis: No Cyanosis: No Ecchymosis: No Ecchymosis: No Ecchymosis: No Erythema: No Erythema: No Erythema: No Hemosiderin Staining: No Hemosiderin Staining: No Hemosiderin Staining: No Mottled: No Mottled: No Mottled: No Pallor: No Pallor: No Pallor: No Rubor: No Rubor: No Rubor: No Temperature: No Abnormality No Abnormality No Abnormality Tenderness on No No Yes Palpation: Wound Preparation: Ulcer Cleansing: Ulcer Cleansing: Ulcer Cleansing: Rinsed/Irrigated with Rinsed/Irrigated with Rinsed/Irrigated with Saline Saline Saline Topical Anesthetic Topical Anesthetic Topical Anesthetic Applied: Other: lidocaine Applied: Other: lidocaine Applied: Other: lidocaine 4% 4% 4% Wound Number: 5 N/A N/A Photos: No Photos N/A N/A Wound Location: Sacrum - Medial N/A N/A Wounding Event: Pressure Injury N/A N/A Primary Etiology: Pressure Ulcer N/A N/A Comorbid History: Cataracts, Asthma, N/A N/A Hypertension, Type II Diabetes, Neuropathy, Received Chemotherapy Date Acquired: 05/19/2015 N/A N/A Weeks of Treatment: 4 N/A N/A Wound Status: Healed - Epithelialized N/A N/A Measurements L x W x D 0x0x0 N/A N/A (cm) Area (cm) : 0  N/A N/A Volume (cm) : 0 N/A N/A % Reduction in Area: 100.00% N/A N/A % Reduction in Volume: 100.00% N/A N/A Classification: Category/Stage II N/A N/A HBO Classification: N/A N/A N/A Exudate Amount: None Present N/A N/A Exudate Type: N/A N/A N/A Lardner, Adabella H. ([id number]) Exudate Color: N/A N/A N/A Wound Margin: Distinct, outline attached N/A N/A Granulation Amount: None Present (0%) N/A N/A Granulation Quality: N/A N/A N/A Necrotic Amount: None Present (0%) N/A N/A Exposed Structures: Fascia: No N/A N/A Fat: No Tendon: No Muscle: No Joint: No Bone: No Limited to Skin Breakdown Epithelialization: Large (67-100%) N/A N/A Periwound Skin Texture: Edema: No N/A N/A Excoriation: No Induration: No Callus: No Crepitus: No Fluctuance: No Friable: No Rash: No Scarring: No Periwound Skin Dry/Scaly: Yes N/A N/A Moisture: Maceration: No Moist: No Periwound Skin Color: Atrophie Blanche: No N/A N/A Cyanosis: No Ecchymosis: No Erythema: No Hemosiderin Staining: No Mottled: No Pallor: No Rubor: No Temperature: No Abnormality N/A N/A Tenderness on No N/A N/A Palpation: Wound Preparation: Ulcer Cleansing: N/A N/A Rinsed/Irrigated with Saline Topical Anesthetic  Applied: None Treatment Notes Electronic Signature(s) Signed: 06/27/2015 2:21:47 PM By: Regan Lemming BSN, RN Entered By: Regan Lemming on 06/27/2015 14:21:47 Rabelo, Hortencia H. ([id number]) Pulliam, Alexine HMarland Kitchen ([id number]) -------------------------------------------------------------------------------- Multi-Disciplinary Care Plan Details Patient Name: [name], Breck H. Date of Service: 06/27/2015 1:45 PM Medical Record Number: [id number] Patient Account Number: [id number] Date of Birth/Sex: 1946/09/09 (69 y.o. Female) Treating RN: Afful, RN, BSN, [place] Primary Care Physician: Paulita Cradle Other Clinician: Referring Physician: Paulita Cradle Treating Physician/Extender: Frann Rider in Treatment:  15 Active Inactive Abuse / Safety / Falls / Self Care Management Nursing Diagnoses: Impaired home maintenance Impaired physical mobility Knowledge deficit related to: safety; personal, health (wound), emergency Potential for falls Self care deficit: actual or potential Goals: Patient will remain injury free Date Initiated: 03/13/2015 Goal Status: Active Patient/caregiver will verbalize understanding of skin care regimen Date Initiated: 03/13/2015 Goal Status: Active Patient/caregiver will verbalize/demonstrate measure taken to improve self care Date Initiated: 03/13/2015 Goal Status: Active Patient/caregiver will verbalize/demonstrate measures taken to improve the patient's personal safety Date Initiated: 03/13/2015 Goal Status: Active Patient/caregiver will verbalize/demonstrate measures taken to prevent injury and/or falls Date Initiated: 03/13/2015 Goal Status: Active Patient/caregiver will verbalize/demonstrate understanding of what to do in case of emergency Date Initiated: 03/13/2015 Goal Status: Active Interventions: Assess fall risk on admission and as needed Assess: immobility, friction, shearing, incontinence upon admission and as needed Assess impairment of mobility on admission and as needed per policy Assess self care needs on admission and as needed Provide education on basic hygiene Flanary, [place]. ([id number]) Provide education on fall prevention Provide education on personal and home safety Provide education on safe transfers Treatment Activities: Education provided on Basic Hygiene : 03/13/2015 Notes: Orientation to the Wound Care Program Nursing Diagnoses: Knowledge deficit related to the wound healing center program Goals: Patient/caregiver will verbalize understanding of the [facility] Date Initiated: 03/13/2015 Goal Status: Active Interventions: Provide education on orientation to the wound center Notes: Pressure Nursing  Diagnoses: Knowledge deficit related to causes and risk factors for pressure ulcer development Knowledge deficit related to management of pressures ulcers Potential for impaired tissue integrity related to pressure, friction, moisture, and shear Goals: Patient will remain free from development of additional pressure ulcers Date Initiated: 03/13/2015 Goal Status: Active Patient will remain free of pressure ulcers Date Initiated: 03/13/2015 Goal Status: Active Patient/caregiver will verbalize risk factors for pressure ulcer development Date Initiated: 03/13/2015 Goal Status: Active Patient/caregiver will verbalize understanding of pressure ulcer management Date Initiated: 03/13/2015 Goal Status: Active Interventions: Assess: immobility, friction, shearing, incontinence upon admission and as needed Trulock, Elliana H. ([id number]) Assess offloading mechanisms upon admission and as needed Assess potential for pressure ulcer upon admission and as needed Provide education on pressure ulcers Treatment Activities: Patient referred for home evaluation of offloading devices/mattresses : 06/27/2015 Patient referred for pressure reduction/relief devices : 06/27/2015 Patient referred for seating evaluation to ensure proper offloading : 06/27/2015 Pressure reduction/relief device ordered : 06/27/2015 Test ordered outside of clinic : 06/27/2015 Notes: Wound/Skin Impairment Nursing Diagnoses: Impaired tissue integrity Knowledge deficit related to ulceration/compromised skin integrity Goals: Patient/caregiver will verbalize understanding of skin care regimen Date Initiated: 03/13/2015 Goal Status: Active Ulcer/skin breakdown will have a volume reduction of 30% by week 4 Date Initiated: 03/13/2015 Goal Status: Active Ulcer/skin breakdown will have a volume reduction of 50% by week 8 Date Initiated: 03/13/2015 Goal Status: Active Ulcer/skin breakdown will have a volume reduction of 80% by week 12 Date  Initiated: 03/13/2015 Goal Status: Active Ulcer/skin breakdown will  heal within 14 weeks Date Initiated: 03/13/2015 Goal Status: Active Interventions: Assess patient/caregiver ability to obtain necessary supplies Assess patient/caregiver ability to perform ulcer/skin care regimen upon admission and as needed Assess ulceration(s) every visit Provide education on smoking Provide education on ulcer and skin care Treatment Activities: Patient referred to home care : 06/27/2015 Goates, Adriana H. ([id number]) Referred to DME Rydan Gulyas for dressing supplies : 06/27/2015 Skin care regimen initiated : 06/27/2015 Topical wound management initiated : 06/27/2015 Notes: Electronic Signature(s) Signed: 06/27/2015 2:21:39 PM By: Regan Lemming BSN, RN Entered By: Regan Lemming on 06/27/2015 14:21:39 Butson, Amylia H. ([id number]) -------------------------------------------------------------------------------- Pain Assessment Details Patient Name: [name], Brisha H. Date of Service: 06/27/2015 1:45 PM Medical Record Number: [id number] Patient Account Number: [id number] Date of Birth/Sex: [birth year] (69 y.o. Female) Treating RN: Baruch Gouty, RN, BSN, Velva Harman Primary Care Physician: Paulita Cradle Other Clinician: Referring Physician: Paulita Cradle Treating Physician/Extender: Frann Rider in Treatment: 15 Active Problems Location of Pain Severity and Description of Pain Patient Has Paino No Site Locations Pain Management and Medication Current Pain Management: Electronic Signature(s) Signed: 06/27/2015 1:59:51 PM By: Regan Lemming BSN, RN Entered By: Regan Lemming on 06/27/2015 13:59:51 Kennerson, Norell H. ([id number]) -------------------------------------------------------------------------------- Patient/Caregiver Education Details Patient Name: [name], Danice H. Date of Service: 06/27/2015 1:45 PM Medical Record Number: [id number] Patient Account Number: [id number] Date of Birth/Gender: [birth year] (69 y.o.  Female) Treating RN: Afful, RN, BSN, Velva Harman Primary Care Physician: Paulita Cradle Other Clinician: Referring Physician: Paulita Cradle Treating Physician/Extender: Frann Rider in Treatment: 15 Education Assessment Education Provided To: Patient and Caregiver Education Topics Provided Basic Hygiene: Methods: Explain/Verbal Responses: State content correctly Pressure: Methods: Explain/Verbal Responses: State content correctly Safety: Methods: Explain/Verbal Responses: State content correctly Smoking and Wound Healing: Methods: Explain/Verbal Responses: State content correctly Welcome To [facility]: Methods: Explain/Verbal Responses: State content correctly Wound/Skin Impairment: Methods: Explain/Verbal Responses: State content correctly Electronic Signature(s) Signed: 06/27/2015 2:25:06 PM By: Regan Lemming BSN, RN Entered By: Regan Lemming on 06/27/2015 14:25:05 Bill, Callan H. ([id number]) -------------------------------------------------------------------------------- Wound Assessment Details Patient Name: Glendening, Shanzay H. Date of Service: 06/27/2015 1:45 PM Medical Record Number: [id number] Patient Account Number: [id number] Date of Birth/Sex: [birth year] (69 y.o. Female) Treating RN: Afful, RN, BSN, [place] Primary Care Physician: Paulita Cradle Other Clinician: Referring Physician: Paulita Cradle Treating Physician/Extender: Frann Rider in Treatment: 15 Wound Status Wound Number: 2 Primary Pressure Ulcer Etiology: Wound Location: Right Elbow Wound Open Wounding Event: Gradually Appeared Status: Date Acquired: 02/25/2015 Comorbid Cataracts, Asthma, Hypertension, Type Weeks Of Treatment: 15 History: II Diabetes, Neuropathy, Received Clustered Wound: No Chemotherapy Photos Photo Uploaded By: Regan Lemming on 06/27/2015 15:30:07 Wound Measurements Length: (cm) 0.5 Width: (cm) 0.5 Depth: (cm) 0.1 Area: (cm) 0.196 Volume:  (cm) 0.02 % Reduction in Area: 89.6% % Reduction in Volume: 94.7% Tunneling: No Undermining: No Wound Description Classification: Category/Stage III Wound Margin: Distinct, outline attached Exudate Amount: Small Exudate Type: Serosanguineous Exudate Color: red, brown Foul Odor After Cleansing: No Wound Bed Granulation Amount: Small (1-33%) Exposed Structure Granulation Quality: Red Fascia Exposed: No Necrotic Amount: Small (1-33%) Fat Layer Exposed: No Necrotic Quality: Adherent Slough Tendon Exposed: No Yi, Irva H. ([id number]) Muscle Exposed: No Joint Exposed: No Bone Exposed: No Limited to Skin Breakdown Periwound Skin Texture Texture Color No Abnormalities Noted: No No Abnormalities Noted: No Callus: No Atrophie Blanche: No Crepitus: No Cyanosis: No Excoriation: No Ecchymosis: No Fluctuance: No Erythema: No Friable: No Hemosiderin Staining: No Induration: No Mottled: No Localized Edema: No Pallor: No Rash: No Rubor: No  Scarring: No Temperature / Pain Moisture Temperature: No Abnormality No Abnormalities Noted: No Dry / Scaly: No Maceration: No Moist: Yes Wound Preparation Ulcer Cleansing: Rinsed/Irrigated with Saline Topical Anesthetic Applied: Other: lidocaine 4%, Treatment Notes Wound #2 (Right Elbow) 1. Cleansed with: Clean wound with Normal Saline 4. Dressing Applied: Aquacel Ag 5. Secondary Dressing Applied Bordered Foam Dressing Electronic Signature(s) Signed: 06/27/2015 2:15:21 PM By: Regan Lemming BSN, RN Entered By: Regan Lemming on 06/27/2015 14:15:21 Cinque, Madora H. ([id number]) -------------------------------------------------------------------------------- Wound Assessment Details Patient Name: Echavarria, Katherleen H. Date of Service: 06/27/2015 1:45 PM Medical Record Number: [id number] Patient Account Number: [id number] Date of Birth/Sex: Feb 15, 1946 (69 y.o. Female) Treating RN: Afful, RN, BSN, [place] Primary Care Physician: Paulita Cradle Other Clinician: Referring Physician: Paulita Cradle Treating Physician/Extender: Frann Rider in Treatment: 15 Wound Status Wound Number: 3 Primary Pressure Ulcer Etiology: Wound Location: Back - Midline Wound Open Wounding Event: Pressure Injury Status: Date Acquired: 05/02/2015 Comorbid Cataracts, Asthma, Hypertension, Type Weeks Of Treatment: 8 History: II Diabetes, Neuropathy, Received Clustered Wound: No Chemotherapy Photos Photo Uploaded By: Regan Lemming on 06/27/2015 15:30:07 Wound Measurements Length: (cm) 4.5 Width: (cm) 2.9 Depth: (cm) 0.1 Area: (cm) 10.249 Volume: (cm) 1.025 % Reduction in Area: -4558.6% % Reduction in Volume: -4559.1% Tunneling: No Undermining: No Wound Description Classification: Category/Stage II Wound Margin: Distinct, outline attached Exudate Amount: Medium Exudate Type: Serosanguineous Exudate Color: red, brown Foul Odor After Cleansing: No Wound Bed Granulation Amount: Small (1-33%) Exposed Structure Necrotic Amount: Large (67-100%) Fascia Exposed: No Necrotic Quality: Adherent Slough Fat Layer Exposed: No Tendon Exposed: No Kingry, Tawney H. ([id number]) Muscle Exposed: No Joint Exposed: No Bone Exposed: No Limited to Skin Breakdown Periwound Skin Texture Texture Color No Abnormalities Noted: No No Abnormalities Noted: No Callus: No Atrophie Blanche: No Crepitus: No Cyanosis: No Excoriation: No Ecchymosis: No Fluctuance: No Erythema: No Friable: No Hemosiderin Staining: No Induration: No Mottled: No Localized Edema: No Pallor: No Rash: No Rubor: No Scarring: No Temperature / Pain Moisture Temperature: No Abnormality No Abnormalities Noted: No Dry / Scaly: No Maceration: No Moist: Yes Wound Preparation Ulcer Cleansing: Rinsed/Irrigated with Saline Topical Anesthetic Applied: Other: lidocaine 4%, Treatment Notes Wound #3 (Midline Back) 1. Cleansed with: Clean wound with Normal  Saline 4. Dressing Applied: Santyl Ointment 5. Secondary Dressing Applied Bordered Foam Dressing Electronic Signature(s) Signed: 06/27/2015 2:20:00 PM By: Regan Lemming BSN, RN Entered By: Regan Lemming on 06/27/2015 14:20:00 Bua, Gloriana H. ([id number]) -------------------------------------------------------------------------------- Wound Assessment Details Patient Name: Wix, Minka H. Date of Service: 06/27/2015 1:45 PM Medical Record Number: [id number] Patient Account Number: [id number] Date of Birth/Sex: [birth year] (69 y.o. Female) Treating RN: Afful, RN, BSN, [place] Primary Care Physician: Paulita Cradle Other Clinician: Referring Physician: Paulita Cradle Treating Physician/Extender: Frann Rider in Treatment: 15 Wound Status Wound Number: 4 Primary Pressure Ulcer Etiology: Wound Location: Right Calcaneous Wound Open Wounding Event: Pressure Injury Status: Date Acquired: 05/11/2015 Comorbid Cataracts, Asthma, Hypertension, Type Weeks Of Treatment: 6 History: II Diabetes, Neuropathy, Received Clustered Wound: No Chemotherapy Photos Photo Uploaded By: Regan Lemming on 06/27/2015 15:30:08 Wound Measurements Length: (cm) 1.2 Width: (cm) 0.8 Depth: (cm) 0.1 Area: (cm) 0.754 Volume: (cm) 0.075 % Reduction in Area: 79.1% % Reduction in Volume: 79.2% Tunneling: No Undermining: No Wound Description Classification: Category/Stage III Foul O Diabetic Severity (Wagner): Grade 1 Wound Margin: Distinct, outline attached Exudate Amount: Medium Exudate Type: Serosanguineous Exudate Color: red, brown dor After Cleansing: No Wound Bed Granulation Amount: Medium (34-66%) Exposed Structure Necrotic Amount: Small (1-33%) Fascia Exposed:  No Necrotic Quality: Adherent Slough Fat Layer Exposed: No Olivarez, Jadine H. ([id number]) Tendon Exposed: No Muscle Exposed: No Joint Exposed: No Bone Exposed: No Limited to Skin Breakdown Periwound Skin Texture Texture  Color No Abnormalities Noted: No No Abnormalities Noted: No Callus: No Atrophie Blanche: No Crepitus: No Cyanosis: No Excoriation: No Ecchymosis: No Fluctuance: No Erythema: No Friable: No Hemosiderin Staining: No Induration: No Mottled: No Localized Edema: No Pallor: No Rash: No Rubor: No Scarring: No Temperature / Pain Moisture Temperature: No Abnormality No Abnormalities Noted: No Tenderness on Palpation: Yes Dry / Scaly: No Maceration: No Moist: Yes Wound Preparation Ulcer Cleansing: Rinsed/Irrigated with Saline Topical Anesthetic Applied: Other: lidocaine 4%, Treatment Notes Wound #4 (Right Calcaneous) 1. Cleansed with: Clean wound with Normal Saline 4. Dressing Applied: Aquacel Ag 5. Secondary Dressing Applied Bordered Foam Dressing Electronic Signature(s) Signed: 06/27/2015 2:20:57 PM By: Regan Lemming BSN, RN Entered By: Regan Lemming on 06/27/2015 14:20:57 Parlin, Kimbrely H. ([id number]) -------------------------------------------------------------------------------- Wound Assessment Details Patient Name: Lesinski, Della H. Date of Service: 06/27/2015 1:45 PM Medical Record Number: [id number] Patient Account Number: [id number] Date of Birth/Sex: [birth year] (69 y.o. Female) Treating RN: Afful, RN, BSN, [place] Primary Care Physician: Paulita Cradle Other Clinician: Referring Physician: Paulita Cradle Treating Physician/Extender: Frann Rider in Treatment: 15 Wound Status Wound Number: 5 Primary Pressure Ulcer Etiology: Wound Location: Sacrum - Medial Wound Healed - Epithelialized Wounding Event: Pressure Injury Status: Date Acquired: 05/19/2015 Comorbid Cataracts, Asthma, Hypertension, Type Weeks Of Treatment: 4 History: II Diabetes, Neuropathy, Received Clustered Wound: No Chemotherapy Photos Photo Uploaded By: Regan Lemming on 06/27/2015 15:30:33 Wound Measurements Length: (cm) 0 % Reductio Width: (cm) 0 % Reductio Depth: (cm) 0  Epithelial Area: (cm) 0 Tunneling Volume: (cm) 0 Undermini n in Area: 100% n in Volume: 100% ization: Large (67-100%) : No ng: No Wound Description Classification: Category/Stage II Wound Margin: Distinct, outline attached Exudate Amount: None Present Foul Odor After Cleansing: No Wound Bed Granulation Amount: None Present (0%) Exposed Structure Necrotic Amount: None Present (0%) Fascia Exposed: No Fat Layer Exposed: No Tendon Exposed: No Muscle Exposed: No Joint Exposed: No Osborn, Shabree H. ([id number]) Bone Exposed: No Limited to Skin Breakdown Periwound Skin Texture Texture Color No Abnormalities Noted: No No Abnormalities Noted: No Callus: No Atrophie Blanche: No Crepitus: No Cyanosis: No Excoriation: No Ecchymosis: No Fluctuance: No Erythema: No Friable: No Hemosiderin Staining: No Induration: No Mottled: No Localized Edema: No Pallor: No Rash: No Rubor: No Scarring: No Temperature / Pain Moisture Temperature: No Abnormality No Abnormalities Noted: No Dry / Scaly: Yes Maceration: No Moist: No Wound Preparation Ulcer Cleansing: Rinsed/Irrigated with Saline Topical Anesthetic Applied: None Electronic Signature(s) Signed: 06/27/2015 2:21:31 PM By: Regan Lemming BSN, RN Entered By: Regan Lemming on 06/27/2015 14:21:31 Brunker, Taydem H. ([id number]) -------------------------------------------------------------------------------- Vitals Details Patient Name: [name], Ariani H. Date of Service: 06/27/2015 1:45 PM Medical Record Number: [id number] Patient Account Number: [id number] Date of Birth/Sex: [birth year] (69 y.o. Female) Treating RN: Afful, RN, BSN, [place] Primary Care Physician: Paulita Cradle Other Clinician: Referring Physician: Paulita Cradle Treating Physician/Extender: Frann Rider in Treatment: 15 Vital Signs Time Taken: 14:04 Temperature (F): 97.8 Height (in): 65 Respiratory Rate (breaths/min): 17 Weight (lbs): 118 Reference  Range: 80 - 120 mg / dl Body Mass Index (BMI): 19.6 Electronic Signature(s) Signed: 06/27/2015 2:30:21 PM By: Regan Lemming BSN, RN Entered By: Regan Lemming on 06/27/2015 14:30:21

## 2015-07-04 ENCOUNTER — Encounter: Payer: Self-pay | Admitting: General Surgery

## 2015-07-04 ENCOUNTER — Encounter (HOSPITAL_BASED_OUTPATIENT_CLINIC_OR_DEPARTMENT_OTHER): Payer: Medicare Other | Admitting: General Surgery

## 2015-07-04 DIAGNOSIS — L89012 Pressure ulcer of right elbow, stage 2: Secondary | ICD-10-CM | POA: Diagnosis not present

## 2015-07-04 DIAGNOSIS — L89113 Pressure ulcer of right upper back, stage 3: Secondary | ICD-10-CM | POA: Diagnosis not present

## 2015-07-04 DIAGNOSIS — L89612 Pressure ulcer of right heel, stage 2: Secondary | ICD-10-CM

## 2015-07-04 DIAGNOSIS — L89613 Pressure ulcer of right heel, stage 3: Secondary | ICD-10-CM | POA: Diagnosis not present

## 2015-07-04 NOTE — Progress Notes (Signed)
seeiheal 

## 2015-07-05 NOTE — Progress Notes (Signed)
[name], [name] ([id number]) Visit Report for 07/04/2015 Arrival Information Details Patient Name: [name], [name] Date of Service: 07/04/2015 1:45 PM Medical Record Number: [id number] Patient Account Number: [id number] Date of Birth/Sex: [birth year] (69 y.o. Female) Treating RN: Cornell Barman Primary Care Physician: Paulita Cradle Other Clinician: Referring Physician: Paulita Cradle Treating Physician/Extender: Benjaman Pott in Treatment: 16 Visit Information History Since Last Visit Added or deleted any medications: No Patient Arrived: Wheel Chair Any new allergies or adverse reactions: No Arrival Time: 13:50 Had a fall or experienced change in No activities of daily living that may affect Accompanied By: self risk of falls: Transfer Assistance: Other Signs or symptoms of abuse/neglect since last No Patient Identification Verified: Yes visito Secondary Verification Process Yes Hospitalized since last visit: No Completed: Has Dressing in Place as Prescribed: Yes Patient Requires Transmission-Based No Pain Present Now: No Precautions: Patient Has Alerts: No Electronic Signature(s) Signed: 07/04/2015 3:34:06 PM By: Gretta Cool, RN, BSN, Kim RN, BSN Entered By: Gretta Cool, RN, BSN, Kim on 07/04/2015 13:57:31 [name], [name] ([id number]) -------------------------------------------------------------------------------- Clinic Level of Care Assessment Details Patient Name: [name], [name] Date of Service: 07/04/2015 1:45 PM Medical Record Number: [id number] Patient Account Number: [id number] Date of Birth/Sex: 22-Feb-1946 (69 y.o. Female) Treating RN: Montey Hora Primary Care Physician: Paulita Cradle Other Clinician: Referring Physician: Paulita Cradle Treating Physician/Extender: Benjaman Pott in Treatment: 16 Clinic Level of Care Assessment Items TOOL 4 Quantity Score []  - Use when only an EandM is performed on FOLLOW-UP visit 0 ASSESSMENTS - Nursing  Assessment / Reassessment X - Reassessment of Co-morbidities (includes updates in patient status) 1 10 X - Reassessment of Adherence to Treatment Plan 1 5 ASSESSMENTS - Wound and Skin Assessment / Reassessment []  - Simple Wound Assessment / Reassessment - one wound 0 X - Complex Wound Assessment / Reassessment - multiple wounds 3 5 []  - Dermatologic / Skin Assessment (not related to wound area) 0 ASSESSMENTS - Focused Assessment []  - Circumferential Edema Measurements - multi extremities 0 []  - Nutritional Assessment / Counseling / Intervention 0 X - Lower Extremity Assessment (monofilament, tuning fork, pulses) 1 5 []  - Peripheral Arterial Disease Assessment (using hand held doppler) 0 ASSESSMENTS - Ostomy and/or Continence Assessment and Care []  - Incontinence Assessment and Management 0 []  - Ostomy Care Assessment and Management (repouching, etc.) 0 PROCESS - Coordination of Care X - Simple Patient / Family Education for ongoing care 1 15 []  - Complex (extensive) Patient / Family Education for ongoing care 0 []  - Staff obtains Programmer, systems, Records, Test Results / Process Orders 0 []  - Staff telephones HHA, Nursing Homes / Clarify orders / etc 0 []  - Routine Transfer to another Facility (non-emergent condition) 0 [name], [name] ([id number]) []  - Routine Hospital Admission (non-emergent condition) 0 []  - New Admissions / Biomedical engineer / Ordering NPWT, Apligraf, etc. 0 []  - Emergency Hospital Admission (emergent condition) 0 X - Simple Discharge Coordination 1 10 []  - Complex (extensive) Discharge Coordination 0 PROCESS - Special Needs []  - Pediatric / Minor Patient Management 0 []  - Isolation Patient Management 0 []  - Hearing / Language / Visual special needs 0 []  - Assessment of Community assistance (transportation, D/C planning, etc.) 0 []  - Additional assistance / Altered mentation 0 []  - Support Surface(s) Assessment (bed, cushion, seat, etc.) 0 INTERVENTIONS - Wound  Cleansing / Measurement []  - Simple Wound Cleansing - one wound 0 X - Complex Wound Cleansing - multiple wounds 3 5 X - Wound Imaging (photographs - any  number of wounds) 1 5 []  - Wound Tracing (instead of photographs) 0 []  - Simple Wound Measurement - one wound 0 X - Complex Wound Measurement - multiple wounds 3 5 INTERVENTIONS - Wound Dressings X - Small Wound Dressing one or multiple wounds 3 10 []  - Medium Wound Dressing one or multiple wounds 0 []  - Large Wound Dressing one or multiple wounds 0 []  - Application of Medications - topical 0 []  - Application of Medications - injection 0 INTERVENTIONS - Miscellaneous []  - External ear exam 0 [name], [name] ([id number]) []  - Specimen Collection (cultures, biopsies, blood, body fluids, etc.) 0 []  - Specimen(s) / Culture(s) sent or taken to Lab for analysis 0 []  - Patient Transfer (multiple staff / Civil Service fast streamer / Similar devices) 0 []  - Simple Staple / Suture removal (25 or less) 0 []  - Complex Staple / Suture removal (26 or more) 0 []  - Hypo / Hyperglycemic Management (close monitor of Blood Glucose) 0 []  - Ankle / Brachial Index (ABI) - do not check if billed separately 0 X - Vital Signs 1 5 Has the patient been seen at the hospital within the last three years: Yes Total Score: 130 Level Of Care: New/Established - Level 4 Electronic Signature(s) Signed: 07/04/2015 3:16:02 PM By: Montey Hora Entered By: Montey Hora on 07/04/2015 14:07:11 [name], [name] ([id number]) -------------------------------------------------------------------------------- Encounter Discharge Information Details Patient Name: [name], [name] Date of Service: 07/04/2015 1:45 PM Medical Record Number: [id number] Patient Account Number: [id number] Date of Birth/Sex: [birth year] (69 y.o. Female) Treating RN: Montey Hora Primary Care Physician: Paulita Cradle Other Clinician: Referring Physician: Paulita Cradle Treating Physician/Extender: Benjaman Pott in Treatment: 16 Encounter Discharge Information Items Discharge Pain Level: 0 Discharge Condition: Stable Ambulatory Status: Walker Discharge Destination: Home Transportation: Private Auto Accompanied By: spouse Schedule Follow-up Appointment: Yes Medication Reconciliation completed and provided to Patient/Care No Adhrit Krenz: Provided on Clinical Summary of Care: 07/04/2015 Form Type Recipient Paper Patient AS Electronic Signature(s) Signed: 07/04/2015 2:32:33 PM By: Judene Companion MD Previous Signature: 07/04/2015 2:23:16 PM Version By: Ruthine Dose Entered By: Judene Companion on 07/04/2015 14:32:33 [name], [name] ([id number]) -------------------------------------------------------------------------------- Lower Extremity Assessment Details Patient Name: [name], [name] Date of Service: 07/04/2015 1:45 PM Medical Record Number: [id number] Patient Account Number: [id number] Date of Birth/Sex: December 21, 1945 (69 y.o. Female) Treating RN: Cornell Barman Primary Care Physician: Paulita Cradle Other Clinician: Referring Physician: Paulita Cradle Treating Physician/Extender: Benjaman Pott in Treatment: 16 Vascular Assessment Pulses: Posterior Tibial Dorsalis Pedis Palpable: [Right:Yes] Toe Nail Assessment Left: Right: Thick: Yes Discolored: Yes Deformed: Yes Improper Length and Hygiene: Yes Electronic Signature(s) Signed: 07/04/2015 3:34:06 PM By: Gretta Cool, RN, BSN, Kim RN, BSN Entered By: Gretta Cool, RN, BSN, Kim on 07/04/2015 13:56:32 Siegrist, Caidyn H. ([id number]) -------------------------------------------------------------------------------- Multi Wound Chart Details Patient Name: [name], Malaak H. Date of Service: 07/04/2015 1:45 PM Medical Record Number: [id number] Patient Account Number: [id number] Date of Birth/Sex: 1946-03-10 (69 y.o. Female) Treating RN: Cornell Barman Primary Care Physician: Paulita Cradle Other Clinician: Referring Physician:  Paulita Cradle Treating Physician/Extender: Benjaman Pott in Treatment: 16 Vital Signs Height(in): 65 Pulse(bpm): 103 Weight(lbs): 118 Blood Pressure 117/69 (mmHg): Body Mass Index(BMI): 20 Temperature(F): 98.2 Respiratory Rate 18 (breaths/min): Photos: [2:No Photos] [3:No Photos] [4:No Photos] Wound Location: [2:Right Elbow] [3:Midline Back] [4:Right Calcaneous] Wounding Event: [2:Gradually Appeared] [3:Pressure Injury] [4:Pressure Injury] Primary Etiology: [2:Pressure Ulcer] [3:Pressure Ulcer] [4:Pressure Ulcer] Date Acquired: [2:02/25/2015] [3:05/02/2015] [4:05/11/2015] Weeks of Treatment: [2:16] [3:9] [4:7] Wound Status: [2:Open] [3:Open] [4:Open] Measurements L x W x  D 0.3x0.3x0.1 [3:4.1x2.5x0.1] [4:1.1x1x0.1] (cm) Area (cm) : [2:0.071] [3:8.05] [4:0.864] Volume (cm) : [2:0.007] [3:0.805] [4:0.086] % Reduction in Area: [2:96.20%] [3:-3559.10%] [4:76.10%] % Reduction in Volume: 98.10% [3:-3559.10%] [4:76.20%] Classification: [2:Category/Stage III] [3:Category/Stage II] [4:Category/Stage III] Periwound Skin Texture: No Abnormalities Noted [3:No Abnormalities Noted] [4:No Abnormalities Noted] Periwound Skin [2:No Abnormalities Noted] [3:No Abnormalities Noted] [4:No Abnormalities Noted] Moisture: Periwound Skin Color: No Abnormalities Noted [3:No Abnormalities Noted] [4:No Abnormalities Noted] Tenderness on [2:No] [3:No] [4:No] Treatment Notes Electronic Signature(s) Signed: 07/04/2015 3:34:06 PM By: Gretta Cool, RN, BSN, Kim RN, BSN Entered By: Gretta Cool, RN, BSN, Kim on 07/04/2015 13:56:50 [name], [name] ([id number]) -------------------------------------------------------------------------------- [place] Details Patient Name: [name], Zada H. Date of Service: 07/04/2015 1:45 PM Medical Record Number: [id number] Patient Account Number: [id number] Date of Birth/Sex: Jun 30, 1946 (69 y.o. Female) Treating RN: Cornell Barman Primary Care Physician:  Paulita Cradle Other Clinician: Referring Physician: Paulita Cradle Treating Physician/Extender: Benjaman Pott in Treatment: 16 Active Inactive Abuse / Safety / Falls / Self Care Management Nursing Diagnoses: Impaired home maintenance Impaired physical mobility Knowledge deficit related to: safety; personal, health (wound), emergency Potential for falls Self care deficit: actual or potential Goals: Patient will remain injury free Date Initiated: 03/13/2015 Goal Status: Active Patient/caregiver will verbalize understanding of skin care regimen Date Initiated: 03/13/2015 Goal Status: Active Patient/caregiver will verbalize/demonstrate measure taken to improve self care Date Initiated: 03/13/2015 Goal Status: Active Patient/caregiver will verbalize/demonstrate measures taken to improve the patient's personal safety Date Initiated: 03/13/2015 Goal Status: Active Patient/caregiver will verbalize/demonstrate measures taken to prevent injury and/or falls Date Initiated: 03/13/2015 Goal Status: Active Patient/caregiver will verbalize/demonstrate understanding of what to do in case of emergency Date Initiated: 03/13/2015 Goal Status: Active Interventions: Assess fall risk on admission and as needed Assess: immobility, friction, shearing, incontinence upon admission and as needed Assess impairment of mobility on admission and as needed per policy Assess self care needs on admission and as needed Provide education on basic hygiene Crume, [place]. ([id number]) Provide education on fall prevention Provide education on personal and home safety Provide education on safe transfers Treatment Activities: Education provided on Basic Hygiene : 03/13/2015 Notes: Orientation to the Wound Care Program Nursing Diagnoses: Knowledge deficit related to the wound healing center program Goals: Patient/caregiver will verbalize understanding of the [place] Program Date  Initiated: 03/13/2015 Goal Status: Active Interventions: Provide education on orientation to the wound center Notes: Pressure Nursing Diagnoses: Knowledge deficit related to causes and risk factors for pressure ulcer development Knowledge deficit related to management of pressures ulcers Potential for impaired tissue integrity related to pressure, friction, moisture, and shear Goals: Patient will remain free from development of additional pressure ulcers Date Initiated: 03/13/2015 Goal Status: Active Patient will remain free of pressure ulcers Date Initiated: 03/13/2015 Goal Status: Active Patient/caregiver will verbalize risk factors for pressure ulcer development Date Initiated: 03/13/2015 Goal Status: Active Patient/caregiver will verbalize understanding of pressure ulcer management Date Initiated: 03/13/2015 Goal Status: Active Interventions: Assess: immobility, friction, shearing, incontinence upon admission and as needed Lauricella, Zakariah H. ([id number]) Assess offloading mechanisms upon admission and as needed Assess potential for pressure ulcer upon admission and as needed Provide education on pressure ulcers Treatment Activities: Patient referred for home evaluation of offloading devices/mattresses : 07/04/2015 Patient referred for pressure reduction/relief devices : 07/04/2015 Patient referred for seating evaluation to ensure proper offloading : 07/04/2015 Pressure reduction/relief device ordered : 07/04/2015 Test ordered outside of clinic : 07/04/2015 Notes: Wound/Skin Impairment Nursing Diagnoses: Impaired tissue integrity Knowledge deficit related to ulceration/compromised  skin integrity Goals: Patient/caregiver will verbalize understanding of skin care regimen Date Initiated: 03/13/2015 Goal Status: Active Ulcer/skin breakdown will have a volume reduction of 30% by week 4 Date Initiated: 03/13/2015 Goal Status: Active Ulcer/skin breakdown will have a volume reduction  of 50% by week 8 Date Initiated: 03/13/2015 Goal Status: Active Ulcer/skin breakdown will have a volume reduction of 80% by week 12 Date Initiated: 03/13/2015 Goal Status: Active Ulcer/skin breakdown will heal within 14 weeks Date Initiated: 03/13/2015 Goal Status: Active Interventions: Assess patient/caregiver ability to obtain necessary supplies Assess patient/caregiver ability to perform ulcer/skin care regimen upon admission and as needed Assess ulceration(s) every visit Provide education on smoking Provide education on ulcer and skin care Treatment Activities: Patient referred to home care : 07/04/2015 DAU, [place]. ([id number]) Referred to DME Krisinda Giovanni for dressing supplies : 07/04/2015 Skin care regimen initiated : 07/04/2015 Topical wound management initiated : 07/04/2015 Notes: Electronic Signature(s) Signed: 07/04/2015 3:34:06 PM By: Gretta Cool, RN, BSN, Kim RN, BSN Entered By: Gretta Cool, RN, BSN, Kim on 07/04/2015 13:56:43 Feinstein, Chantalle H. ([id number]) -------------------------------------------------------------------------------- Pain Assessment Details Patient Name: Latulippe, Stepheni H. Date of Service: 07/04/2015 1:45 PM Medical Record Number: [id number] Patient Account Number: [id number] Date of Birth/Sex: 28-Mar-1946 (69 y.o. Female) Treating RN: Cornell Barman Primary Care Physician: Paulita Cradle Other Clinician: Referring Physician: Paulita Cradle Treating Physician/Extender: Benjaman Pott in Treatment: 16 Active Problems Location of Pain Severity and Description of Pain Patient Has Paino No Site Locations Pain Management and Medication Current Pain Management: Electronic Signature(s) Signed: 07/04/2015 3:34:06 PM By: Gretta Cool, RN, BSN, Kim RN, BSN Entered By: Gretta Cool, RN, BSN, Kim on 07/04/2015 13:58:04 Linnen, Oluwademilade Lemmie Evens ([id number]) -------------------------------------------------------------------------------- Patient/Caregiver Education Details Patient Name:  [name], Aislee H. Date of Service: 07/04/2015 1:45 PM Medical Record Number: [id number] Patient Account Number: [id number] Date of Birth/Gender: August 06, 1946 (69 y.o. Female) Treating RN: Montey Hora Primary Care Physician: Paulita Cradle Other Clinician: Referring Physician: Paulita Cradle Treating Physician/Extender: Benjaman Pott in Treatment: 16 Education Assessment Education Provided To: Patient and Caregiver Education Topics Provided Pressure: Handouts: Other: pressure relief Methods: Explain/Verbal Responses: State content correctly Wound/Skin Impairment: Handouts: Other: keep surgical appointment Methods: Explain/Verbal Responses: State content correctly Electronic Signature(s) Signed: 07/04/2015 2:32:44 PM By: Judene Companion MD Entered By: Judene Companion on 07/04/2015 14:32:44 Rosenberger, Janeisha H. ([id number]) -------------------------------------------------------------------------------- Wound Assessment Details Patient Name: Branaman, Joby H. Date of Service: 07/04/2015 1:45 PM Medical Record Number: [id number] Patient Account Number: [id number] Date of Birth/Sex: May 15, 1946 (69 y.o. Female) Treating RN: Cornell Barman Primary Care Physician: Paulita Cradle Other Clinician: Referring Physician: Paulita Cradle Treating Physician/Extender: Benjaman Pott in Treatment: 16 Wound Status Wound Number: 2 Primary Etiology: Pressure Ulcer Wound Location: Right Elbow Wound Status: Open Wounding Event: Gradually Appeared Date Acquired: 02/25/2015 Weeks Of Treatment: 16 Clustered Wound: No Photos Photo Uploaded By: Gretta Cool, RN, BSN, Kim on 07/04/2015 15:25:24 Wound Measurements Length: (cm) 0.3 Width: (cm) 0.3 Depth: (cm) 0.1 Area: (cm) 0.071 Volume: (cm) 0.007 % Reduction in Area: 96.2% % Reduction in Volume: 98.1% Wound Description Classification: Category/Stage III Periwound Skin Texture Texture Color No Abnormalities Noted: No No  Abnormalities Noted: No Moisture No Abnormalities Noted: No Treatment Notes Wound #2 (Right Elbow) 1. Cleansed with: Wismer, Ahsha H. ([id number]) Clean wound with Normal Saline 2. Anesthetic Topical Lidocaine 4% cream to wound bed prior to debridement 4. Dressing Applied: Aquacel Ag 5. Secondary Dressing Applied Bordered Foam Dressing Electronic Signature(s) Signed: 07/04/2015 3:34:06 PM By: Gretta Cool, RN, BSN, Kim RN, BSN Entered By: Gretta Cool, RN, BSN, Kim  on 07/04/2015 13:54:34 Weddington, Alonni H. ([id number]) -------------------------------------------------------------------------------- Wound Assessment Details Patient Name: Dorval, Nioka H. Date of Service: 07/04/2015 1:45 PM Medical Record Number: [id number] Patient Account Number: [id number] Date of Birth/Sex: 05/29/1946 (69 y.o. Female) Treating RN: Cornell Barman Primary Care Physician: Paulita Cradle Other Clinician: Referring Physician: Paulita Cradle Treating Physician/Extender: Benjaman Pott in Treatment: 16 Wound Status Wound Number: 3 Primary Etiology: Pressure Ulcer Wound Location: Midline Back Wound Status: Open Wounding Event: Pressure Injury Date Acquired: 05/02/2015 Weeks Of Treatment: 9 Clustered Wound: No Photos Photo Uploaded By: Gretta Cool, RN, BSN, Kim on 07/04/2015 15:25:25 Wound Measurements Length: (cm) 4.1 Width: (cm) 2.5 Depth: (cm) 0.1 Area: (cm) 8.05 Volume: (cm) 0.805 % Reduction in Area: -3559.1% % Reduction in Volume: -3559.1% Wound Description Classification: Category/Stage II Periwound Skin Texture Texture Color No Abnormalities Noted: No No Abnormalities Noted: No Moisture No Abnormalities Noted: No Treatment Notes Wound #3 (Midline Back) 1. Cleansed with: Kalman, Darnell H. ([id number]) Clean wound with Normal Saline 2. Anesthetic Topical Lidocaine 4% cream to wound bed prior to debridement 4. Dressing Applied: Santyl Ointment 5. Secondary Dressing Applied Bordered Foam  Dressing Electronic Signature(s) Signed: 07/04/2015 3:34:06 PM By: Gretta Cool, RN, BSN, Kim RN, BSN Entered By: Gretta Cool, RN, BSN, Kim on 07/04/2015 13:55:44 Shrieves, Serenna H. ([id number]) -------------------------------------------------------------------------------- Wound Assessment Details Patient Name: Lindor, Ketzia H. Date of Service: 07/04/2015 1:45 PM Medical Record Number: [id number] Patient Account Number: [id number] Date of Birth/Sex: [birth year] (69 y.o. Female) Treating RN: Cornell Barman Primary Care Physician: Paulita Cradle Other Clinician: Referring Physician: Paulita Cradle Treating Physician/Extender: Benjaman Pott in Treatment: 16 Wound Status Wound Number: 4 Primary Etiology: Pressure Ulcer Wound Location: Right Calcaneous Wound Status: Open Wounding Event: Pressure Injury Date Acquired: 05/11/2015 Weeks Of Treatment: 7 Clustered Wound: No Photos Photo Uploaded By: Gretta Cool, RN, BSN, Kim on 07/04/2015 15:25:40 Wound Measurements Length: (cm) 1.1 Width: (cm) 1 Depth: (cm) 0.1 Area: (cm) 0.864 Volume: (cm) 0.086 % Reduction in Area: 76.1% % Reduction in Volume: 76.2% Wound Description Classification: Category/Stage III Periwound Skin Texture Texture Color No Abnormalities Noted: No No Abnormalities Noted: No Moisture No Abnormalities Noted: No Treatment Notes Wound #4 (Right Calcaneous) 1. Cleansed with: Bertino, Bess H. ([id number]) Clean wound with Normal Saline 2. Anesthetic Topical Lidocaine 4% cream to wound bed prior to debridement 4. Dressing Applied: Aquacel Ag 5. Secondary Dressing Applied Bordered Foam Dressing Electronic Signature(s) Signed: 07/04/2015 3:34:06 PM By: Gretta Cool, RN, BSN, Kim RN, BSN Entered By: Gretta Cool, RN, BSN, Kim on 07/04/2015 13:53:53 Renninger, Payslie H. ([id number]) -------------------------------------------------------------------------------- Vitals Details Patient Name: [name], Tuyen H. Date of Service: 07/04/2015 1:45  PM Medical Record Number: [id number] Patient Account Number: [id number] Date of Birth/Sex: [birth year] (69 y.o. Female) Treating RN: Cornell Barman Primary Care Physician: Paulita Cradle Other Clinician: Referring Physician: Paulita Cradle Treating Physician/Extender: Benjaman Pott in Treatment: 16 Vital Signs Time Taken: 14:51 Temperature (F): 98.2 Height (in): 65 Pulse (bpm): 103 Weight (lbs): 118 Respiratory Rate (breaths/min): 18 Body Mass Index (BMI): 19.6 Blood Pressure (mmHg): 117/69 Reference Range: 80 - 120 mg / dl Electronic Signature(s) Signed: 07/04/2015 3:34:06 PM By: Gretta Cool, RN, BSN, Kim RN, BSN Entered By: Gretta Cool, RN, BSN, Kim on 07/04/2015 13:52:10

## 2015-07-05 NOTE — Progress Notes (Signed)
[name], [name] ([id number]) Visit Report for 07/04/2015 Chief Complaint Document Details Patient Name: [name], [name] 07/04/2015 1:45 Date of Service: PM Medical Record [id number] Number: Patient Account Number: [id number] 08-Jul-1946 (69 y.o. Treating RN: Baruch Gouty, RN, BSN, Velva Harman Date of Birth/Sex: Female) Other Clinician: Primary Care Physician: Carrie Mew, MIRIAM Treating Jerline Pain, Aleni Andrus Referring Physician: Paulita Cradle Physician/Extender: Weeks in Treatment: 16 Information Obtained from: Patient Chief Complaint Patient presents to the wound care center for a consult due non healing wound. Ulcers on the right elbow and the right heel for about 1 month. Electronic Signature(s) Signed: 07/04/2015 2:29:32 PM By: Judene Companion MD Previous Signature: 07/04/2015 2:22:50 PM Version By: Judene Companion MD Entered By: Judene Companion on 07/04/2015 14:29:32 Montminy, Sevana HMarland [name] ([id number]) -------------------------------------------------------------------------------- HPI Details Patient Name: [name] 07/04/2015 1:45 Date of Service: PM Medical Record [id number] Number: Patient Account Number: [id number] 1946/01/06 (69 y.o. Treating RN: Baruch Gouty, RN, BSN, Velva Harman Date of Birth/Sex: Female) Other Clinician: Primary Care Physician: Carrie Mew, MIRIAM Treating Jerline Pain, Gerica Koble Referring Physician: Paulita Cradle Physician/Extender: Weeks in Treatment: 16 History of Present Illness Location: Ulceration on the right heel and the right elbow. Quality: Patient reports experiencing a dull pain to affected area(s). Severity: Patient states wound (s) are getting better. Duration: Patient has had the wound for < 4 weeks prior to presenting for treatment Timing: Pain in wound is Intermittent (comes and goes Context: The wound appeared gradually over time Modifying Factors: Consults to this date include:Augmentin and Bactrim and also some heel protection with duoderm Associated Signs and  Symptoms: Patient reports having difficulty standing for long periods. HPI Description: 69 year old female with history of peripheral neuropathy, history of diet controlled diabetes mellitus type 2, history of alcoholism here for wound consult sent by her PCP Dr. Sherilyn Cooter. She has pressure ulcers at her right elbow, bilateral heels. Plain films of right calcaneus without acute bony process. Patient started by PCP on Augmentin, Bactrim as per orders, DuoDerm dressings applied - reports some improvement in her ulcer since last seen. Denies fever, chills, nausea, vomiting, diarrhea. She had a right humerus fracture in the middle of May and has had no surgery and arm is in a sling. She is also been laying in the bed for quite a while. Past medical history significant for essential hypertension, osteoporosis, peripheral neuropathy, alcoholism, ataxia, personal history of breast cancer treated with surgery chemotherapy and radiation and this was done in December 2010. she is also status post laparoscopic cholecystectomy, pilonidal cyst excision, subcutaneous port placement, partial mastectomy on the left side, skin cancer removal. 03/21/2015 -- she says overall she's been doing better and continues to smoke about 15 cigarettes a day. 03/21/2015 - her orthopedic doctor has said she may require surgery for her right humerus fracture. 04/04/2015 -- her orthopedic surgery has been scheduled for August 11. 04/18/2015 -- she is doing fine as far as her elbow and her right heel goes but she has developed some redness over prominence on her thoracic spine and wanted me to take a look at this. 05/02/2015 -- she had her surgery done and now is in a sling and support. Her back has developed a pressure injury of undetermined stage. She seems to be in better spirits. 05/16/2015 -- last week her right heel was looking great and we had healed it out, but she has not been offloading appropriately and has a deep tissue  injury on the right heel again. The area on her right elbow has opened out with slough and the area  in the thoracic spine is also getting worse. 05/30/2015 -- she has developed 2 new ulcerations one on her left ischial tuberosity and one on the sacral region. She is still working on getting up smoking but is also unable to take her vitamins as she says she [name], [name] ([id number]) develops a diarrhea when she takes vitamins. She has increased her intake of proteins. 06/06/2015 -- the patient's husband manages to get her a low air loss mattress with initiating pressure but did not know how to use it exactly and the patient was not happy about using it. Overall she says she's been feeling better. Electronic Signature(s) Signed: 07/04/2015 2:30:21 PM By: Judene Companion MD Previous Signature: 07/04/2015 2:23:16 PM Version By: Judene Companion MD Entered By: Judene Companion on 07/04/2015 14:30:21 Funes, Belisa Lemmie Evens ([id number]) -------------------------------------------------------------------------------- Physical Exam Details Patient Name: [name] 07/04/2015 1:45 Date of Service: PM Medical Record [id number] Number: Patient Account Number: [id number] [birth year] (69 y.o. Treating RN: Baruch Gouty, RN, BSN, Velva Harman Date of Birth/Sex: Female) Other Clinician: Primary Care Physician: Carrie Mew, MIRIAM Treating Jerline Pain, Jeymi Hepp Referring Physician: Paulita Cradle Physician/Extender: Weeks in Treatment: 16 Notes Biggest problem is the pressure ulcer 3x4 cm middle of upper back. Treat with santyl. The heel and elbow with collagen Electronic Signature(s) Signed: 07/04/2015 2:30:33 PM By: Judene Companion MD Previous Signature: 07/04/2015 2:25:27 PM Version By: Judene Companion MD Entered By: Judene Companion on 07/04/2015 14:30:33 Slatten, Falen Lemmie Evens ([id number]) -------------------------------------------------------------------------------- Physician Orders Details Patient Name: [name] 07/04/2015  1:45 Date of Service: PM Medical Record [id number] Number: Patient Account Number: [id number] 19-Dec-1945 (69 y.o. Treating RN: Montey Hora Date of Birth/Sex: Female) Other Clinician: Primary Care Physician: Carrie Mew, MIRIAM Treating Jerline Pain, Angel Hobdy Referring Physician: Paulita Cradle Physician/Extender: [place]ella [place] in Treatment: 16 Verbal / Phone Orders: Yes Clinician: Montey Hora Read Back and Verified: Yes Diagnosis Coding Wound Cleansing Wound #2 Right Elbow o Clean wound with Normal Saline. o May Shower, gently pat wound dry prior to applying new dressing. Wound #3 Midline Back o Clean wound with Normal Saline. o May Shower, gently pat wound dry prior to applying new dressing. Wound #4 Right Calcaneous o Clean wound with Normal Saline. o May Shower, gently pat wound dry prior to applying new dressing. Anesthetic Wound #2 Right Elbow o Topical Lidocaine 4% cream applied to wound bed prior to debridement Wound #3 Midline Back o Topical Lidocaine 4% cream applied to wound bed prior to debridement Wound #4 Right Calcaneous o Topical Lidocaine 4% cream applied to wound bed prior to debridement Skin Barriers/Peri-Wound Care Wound #2 Right Elbow o Barrier cream Wound #3 Midline Back o Barrier cream Wound #4 Right Calcaneous o Barrier cream Primary Wound Dressing Wound #2 Right Elbow Salinger, Giuliana H. ([id number]) o Aquacel Ag Wound #4 Right Calcaneous o Aquacel Ag Wound #3 Midline Back o Santyl Ointment Secondary Dressing Wound #2 Right Elbow o Boardered Foam Dressing Wound #3 Midline Back o Boardered Foam Dressing Wound #4 Right Calcaneous o Boardered Foam Dressing Dressing Change Frequency Wound #2 Right Elbow o Change dressing every other day. Wound #3 Midline Back o Change dressing every other day. Wound #4 Right Calcaneous o Change dressing every other day. Follow-up Appointments Wound #2 Right Elbow o  Return Appointment in 1 week. Wound #3 Midline Back o Return Appointment in 1 week. Wound #4 Right Calcaneous o Return Appointment in 1 week. Off-Loading Wound #2 Right Elbow o Heel suspension boot to: - Sage boots heels o Turn and reposition every 2 hours o Mattress -  patient refuses use of air mattress Wound #3 Midline Back o Heel suspension boot to: - Sage boots heels o Turn and reposition every 2 hours o Mattress - patient refuses use of air mattress Vecchione, Elleana H. ([id number]) Wound #4 Right Calcaneous o Heel suspension boot to: - Sage boots heels o Turn and reposition every 2 hours o Mattress - patient refuses use of air mattress Home Health Wound #2 Right Elbow o [place] Nurse may visit PRN to address patientos wound care needs. o FACE TO FACE ENCOUNTER: MEDICARE and MEDICAID PATIENTS: I certify that this patient is under my care and that I had a face-to-face encounter that meets the physician face-to-face encounter requirements with this patient on this date. The encounter with the patient was in whole or in part for the following MEDICAL CONDITION: (primary reason for [place]) MEDICAL NECESSITY: I certify, that based on my findings, NURSING services are a medically necessary home health service. HOME BOUND STATUS: I certify that my clinical findings support that this patient is homebound (i.e., Due to illness or injury, pt requires aid of supportive devices such as crutches, cane, wheelchairs, walkers, the use of special transportation or the assistance of another person to leave their place of residence. There is a normal inability to leave the home and doing so requires considerable and taxing effort. Other absences are for medical reasons / religious services and are infrequent or of short duration when for other reasons). o If current dressing causes regression in wound condition, may D/C ordered  dressing product/s and apply Normal Saline Moist Dressing daily until next [place] / Other MD appointment. [place] of regression in wound condition at [phone]. o Please direct any NON-WOUND related issues/requests for orders to patient's Primary Care Physician Wound #3 Midline Back o [place] Nurse may visit PRN to address patientos wound care needs. o FACE TO FACE ENCOUNTER: MEDICARE and MEDICAID PATIENTS: I certify that this patient is under my care and that I had a face-to-face encounter that meets the physician face-to-face encounter requirements with this patient on this date. The encounter with the patient was in whole or in part for the following MEDICAL CONDITION: (primary reason for [place]) MEDICAL NECESSITY: I certify, that based on my findings, NURSING services are a medically necessary home health service. HOME BOUND STATUS: I certify that my clinical findings support that this patient is homebound (i.e., Due to illness or injury, pt requires aid of supportive devices such as crutches, cane, wheelchairs, walkers, the use of special transportation or the assistance of another person to leave their place of residence. There is a normal inability to leave the home and doing so requires considerable and taxing effort. Other absences are for medical reasons / religious services and are infrequent or of short duration when for other reasons). o If current dressing causes regression in wound condition, may D/C ordered dressing product/s and apply Normal Saline Moist Dressing daily until next [place] / Other MD appointment. [place] of regression in wound condition at [phone]. o Please direct any NON-WOUND related issues/requests for orders to patient's Primary Care Physician Wound #4 Right Kadasia Kassing, Nallely H. ([id number]) o [place] Nurse may visit PRN to address patientos wound care needs. o FACE TO FACE ENCOUNTER: MEDICARE and MEDICAID PATIENTS: I certify that this patient is under my care and that  I had a face-to-face encounter that meets the physician face-to-face encounter requirements with this patient on this date. The encounter with the patient was in whole or in part for the following MEDICAL CONDITION: (primary reason for [place]) MEDICAL NECESSITY: I certify, that based on my findings, NURSING services are a medically necessary home health service. HOME BOUND STATUS: I certify that my clinical findings support that this patient is homebound (i.e., Due to illness or injury, pt requires aid of supportive devices such as crutches, cane, wheelchairs, walkers, the use of special transportation or the assistance of another person to leave their place of residence. There is a normal inability to leave the home and doing so requires considerable and taxing effort. Other absences are for medical reasons / religious services and are infrequent or of short duration when for other reasons). o If current dressing causes regression in wound condition, may D/C ordered dressing product/s and apply Normal Saline Moist Dressing daily until next [place] / Other MD appointment. [place] of regression in wound condition at [phone]. o Please direct any NON-WOUND related issues/requests for orders to patient's Primary Care Physician Electronic Signature(s) Signed: 07/04/2015 3:04:35 PM By: Judene Companion MD Signed: 07/04/2015 3:16:02 PM By: Montey Hora Entered By: Montey Hora on 07/04/2015 14:06:33 Tingler, Shavana HMarland [name] ([id number]) -------------------------------------------------------------------------------- Problem List Details Patient Name: [name] 07/04/2015 1:45 Date of Service: PM Medical Record [id number] Number: Patient Account Number:  [id number] 07-Feb-1946 (69 y.o. Treating RN: Baruch Gouty, RN, BSN, Velva Harman Date of Birth/Sex: Female) Other Clinician: Primary Care Physician: Carrie Mew, MIRIAM Treating Jerline Pain, [place] Referring Physician: Paulita Cradle Physician/Extender: Weeks in Treatment: 16 Active Problems ICD-10 Encounter Code Description Active Date Diagnosis E11.621 Type 2 diabetes mellitus with foot ulcer 03/13/2015 Yes L89.613 Pressure ulcer of right heel, stage 3 03/13/2015 Yes L89.013 Pressure ulcer of right elbow, stage 3 03/13/2015 Yes F17.218 Nicotine dependence, cigarettes, with other nicotine- 03/13/2015 Yes induced disorders L89.100 Pressure ulcer of unspecified part of back, unstageable 05/02/2015 Yes L89.222 Pressure ulcer of left hip, stage 2 05/30/2015 Yes L89.153 Pressure ulcer of sacral region, stage 3 05/30/2015 Yes Inactive Problems Resolved Problems Electronic Signature(s) Signed: 07/04/2015 2:29:19 PM By: Judene Companion MD Previous Signature: 07/04/2015 2:22:23 PM Version By: Judene Companion MD Entered By: Judene Companion on 07/04/2015 14:29:19 Vigeant, Sylvi H. ([id number]) [place], Toshi H. ([id number]) -------------------------------------------------------------------------------- Progress Note Details Patient Name: [name] 07/04/2015 1:45 Date of Service: PM Medical Record [id number] Number: Patient Account Number: [id number] 1946-02-27 (69 y.o. Treating RN: Baruch Gouty, RN, BSN, Velva Harman Date of Birth/Sex: Female) Other Clinician: Primary Care Physician: Carrie Mew, MIRIAM Treating Jerline Pain, Harveer Sadler Referring Physician: Paulita Cradle Physician/Extender: Suella [place] in Treatment: 16 Subjective Chief Complaint Information obtained from Patient Patient presents to the wound care center for a consult due non healing wound. Ulcers on the right elbow and the right heel for about 1 month. History of Present Illness (HPI) The following HPI elements were documented for the patient's wound: Location:  Ulceration on the right heel and the right elbow. Quality: Patient reports experiencing a dull pain to affected area(s). Severity: Patient states wound (s) are getting better. Duration: Patient has had the wound for < 4 weeks prior to presenting for treatment Timing: Pain in wound is Intermittent (comes and goes Context: The wound appeared gradually over time Modifying Factors: Consults to this date include:Augmentin and Bactrim and also some heel protection with duoderm Associated Signs and Symptoms: Patient reports having difficulty standing for long periods. 69 year old female with history of  peripheral neuropathy, history of diet controlled diabetes mellitus type 2, history of alcoholism here for wound consult sent by her PCP Dr. Sherilyn Cooter. She has pressure ulcers at her right elbow, bilateral heels. Plain films of right calcaneus without acute bony process. Patient started by PCP on Augmentin, Bactrim as per orders, DuoDerm dressings applied - reports some improvement in her ulcer since last seen. Denies fever, chills, nausea, vomiting, diarrhea. She had a right humerus fracture in the middle of May and has had no surgery and arm is in a sling. She is also been laying in the bed for quite a while. Past medical history significant for essential hypertension, osteoporosis, peripheral neuropathy, alcoholism, ataxia, personal history of breast cancer treated with surgery chemotherapy and radiation and this was done in December 2010. she is also status post laparoscopic cholecystectomy, pilonidal cyst excision, subcutaneous port placement, partial mastectomy on the left side, skin cancer removal. 03/21/2015 -- she says overall she's been doing better and continues to smoke about 15 cigarettes a day. 03/21/2015 - her orthopedic doctor has said she may require surgery for her right humerus fracture. 04/04/2015 -- her orthopedic surgery has been scheduled for August 11. 04/18/2015 -- she is doing  fine as far as her elbow and her right heel goes but she has developed some Coppedge, Evolett H. ([id number]) redness over prominence on her thoracic spine and wanted me to take a look at this. 05/02/2015 -- she had her surgery done and now is in a sling and support. Her back has developed a pressure injury of undetermined stage. She seems to be in better spirits. 05/16/2015 -- last week her right heel was looking great and we had healed it out, but she has not been offloading appropriately and has a deep tissue injury on the right heel again. The area on her right elbow has opened out with slough and the area in the thoracic spine is also getting worse. 05/30/2015 -- she has developed 2 new ulcerations one on her left ischial tuberosity and one on the sacral region. She is still working on getting up smoking but is also unable to take her vitamins as she says she develops a diarrhea when she takes vitamins. She has increased her intake of proteins. 06/06/2015 -- the patient's husband manages to get her a low air loss mattress with initiating pressure but did not know how to use it exactly and the patient was not happy about using it. Overall she says she's been feeling better. Objective Constitutional Vitals Time Taken: 2:51 PM, Height: 65 in, Weight: 118 lbs, BMI: 19.6, Temperature: 98.2 F, Pulse: 103 bpm, Respiratory Rate: 18 breaths/min, Blood Pressure: 117/69 mmHg. Integumentary (Hair, Skin) Wound #2 status is Open. Original cause of wound was Gradually Appeared. The wound is located on the Right Elbow. The wound measures 0.3cm length x 0.3cm width x 0.1cm depth; 0.071cm^2 area and 0.007cm^3 volume. Wound #3 status is Open. Original cause of wound was Pressure Injury. The wound is located on the Midline Back. The wound measures 4.1cm length x 2.5cm width x 0.1cm depth; 8.05cm^2 area and 0.805cm^3 volume. Wound #4 status is Open. Original cause of wound was Pressure Injury. The wound is  located on the Right Calcaneous. The wound measures 1.1cm length x 1cm width x 0.1cm depth; 0.864cm^2 area and 0.086cm^3 volume. Assessment Active Problems ICD-10 E11.621 - Type 2 diabetes mellitus with foot ulcer L89.613 - Pressure ulcer of right heel, stage 3 L89.013 - Pressure ulcer of right elbow, stage 3  [place], Kanisha H. ([id number]) F17.218 - Nicotine dependence, cigarettes, with other nicotine-induced disorders L89.100 - Pressure ulcer of unspecified part of back, unstageable U44.034 - Pressure ulcer of left hip, stage 2 L89.153 - Pressure ulcer of sacral region, stage 3 Plan Wound Cleansing: Wound #2 Right Elbow: Clean wound with Normal Saline. May Shower, gently pat wound dry prior to applying new dressing. Wound #3 Midline Back: Clean wound with Normal Saline. May Shower, gently pat wound dry prior to applying new dressing. Wound #4 Right Calcaneous: Clean wound with Normal Saline. May Shower, gently pat wound dry prior to applying new dressing. Anesthetic: Wound #2 Right Elbow: Topical Lidocaine 4% cream applied to wound bed prior to debridement Wound #3 Midline Back: Topical Lidocaine 4% cream applied to wound bed prior to debridement Wound #4 Right Calcaneous: Topical Lidocaine 4% cream applied to wound bed prior to debridement Skin Barriers/Peri-Wound Care: Wound #2 Right Elbow: Barrier cream Wound #3 Midline Back: Barrier cream Wound #4 Right Calcaneous: Barrier cream Primary Wound Dressing: Wound #2 Right Elbow: Aquacel Ag Wound #4 Right Calcaneous: Aquacel Ag Wound #3 Midline Back: Santyl Ointment Secondary Dressing: Wound #2 Right Elbow: Boardered Foam Dressing Wound #3 Midline Back: Boardered Foam Dressing Wound #4 Right Calcaneous: Boardered Foam Dressing Ackroyd, Demitra H. ([id number]) Dressing Change Frequency: Wound #2 Right Elbow: Change dressing every other day. Wound #3 Midline Back: Change dressing every other day. Wound #4 Right  Calcaneous: Change dressing every other day. Follow-up Appointments: Wound #2 Right Elbow: Return Appointment in 1 week. Wound #3 Midline Back: Return Appointment in 1 week. Wound #4 Right Calcaneous: Return Appointment in 1 week. Off-Loading: Wound #2 Right Elbow: Heel suspension boot to: - Sage boots heels Turn and reposition every 2 hours Mattress - patient refuses use of air mattress Wound #3 Midline Back: Heel suspension boot to: - Sage boots heels Turn and reposition every 2 hours Mattress - patient refuses use of air mattress Wound #4 Right Calcaneous: Heel suspension boot to: - Sage boots heels Turn and reposition every 2 hours Mattress - patient refuses use of air mattress Home Health: Wound #2 Right Elbow: [place] Nurse may visit PRN to address patient s wound care needs. FACE TO FACE ENCOUNTER: MEDICARE and MEDICAID PATIENTS: I certify that this patient is under my care and that I had a face-to-face encounter that meets the physician face-to-face encounter requirements with this patient on this date. The encounter with the patient was in whole or in part for the following MEDICAL CONDITION: (primary reason for [place]) MEDICAL NECESSITY: I certify, that based on my findings, NURSING services are a medically necessary home health service. HOME BOUND STATUS: I certify that my clinical findings support that this patient is homebound (i.e., Due to illness or injury, pt requires aid of supportive devices such as crutches, cane, wheelchairs, walkers, the use of special transportation or the assistance of another person to leave their place of residence. There is a normal inability to leave the home and doing so requires considerable and taxing effort. Other absences are for medical reasons / religious services and are infrequent or of short duration when for other reasons). If current dressing causes regression in wound condition, may  D/C ordered dressing product/s and apply Normal Saline Moist Dressing daily until next [place] / Other MD appointment. [place] of regression in wound condition at [phone]. Please direct any NON-WOUND related issues/requests for orders to patient's Primary Care Physician Wound #3  Midline Back: [place] Nurse may visit PRN to address patient s wound care needs. FACE TO FACE ENCOUNTER: MEDICARE and MEDICAID PATIENTS: I certify that this patient is under my care and that I had a face-to-face encounter that meets the physician face-to-face encounter requirements with this patient on this date. The encounter with the patient was in whole or in part for the [facility], [place]. ([id number]) following MEDICAL CONDITION: (primary reason for [place]) MEDICAL NECESSITY: I certify, that based on my findings, NURSING services are a medically necessary home health service. HOME BOUND STATUS: I certify that my clinical findings support that this patient is homebound (i.e., Due to illness or injury, pt requires aid of supportive devices such as crutches, cane, wheelchairs, walkers, the use of special transportation or the assistance of another person to leave their place of residence. There is a normal inability to leave the home and doing so requires considerable and taxing effort. Other absences are for medical reasons / religious services and are infrequent or of short duration when for other reasons). If current dressing causes regression in wound condition, may D/C ordered dressing product/s and apply Normal Saline Moist Dressing daily until next [place] / Other MD appointment. [place] of regression in wound condition at [phone]. Please direct any NON-WOUND related issues/requests for orders to patient's Primary Care Physician Wound #4 Right Calcaneous: [place]  Nurse may visit PRN to address patient s wound care needs. FACE TO FACE ENCOUNTER: MEDICARE and MEDICAID PATIENTS: I certify that this patient is under my care and that I had a face-to-face encounter that meets the physician face-to-face encounter requirements with this patient on this date. The encounter with the patient was in whole or in part for the following MEDICAL CONDITION: (primary reason for [place]) MEDICAL NECESSITY: I certify, that based on my findings, NURSING services are a medically necessary home health service. HOME BOUND STATUS: I certify that my clinical findings support that this patient is homebound (i.e., Due to illness or injury, pt requires aid of supportive devices such as crutches, cane, wheelchairs, walkers, the use of special transportation or the assistance of another person to leave their place of residence. There is a normal inability to leave the home and doing so requires considerable and taxing effort. Other absences are for medical reasons / religious services and are infrequent or of short duration when for other reasons). If current dressing causes regression in wound condition, may D/C ordered dressing product/s and apply Normal Saline Moist Dressing daily until next [place] / Other MD appointment. [place] of regression in wound condition at [phone]. Please direct any NON-WOUND related issues/requests for orders to patient's Primary Care Physician Follow-Up Appointments: A follow-up appointment should be scheduled. A Patient Clinical Summary of Care was provided to AS Using santyl on the back and collagen on the heel and elbow. Surgeon to consult on the back in 2 weeks. Electronic Signature(s) Signed: 07/04/2015 2:30:55 PM By: Judene Companion MD Previous Signature: 07/04/2015 2:27:32 PM Version By: Judene Companion MD Entered By: Judene Companion on 07/04/2015 14:30:55 Mirante, Ciara H.  ([id number]) -------------------------------------------------------------------------------- SuperBill Details Patient Name: [name], Cindi H. Date of Service: 07/04/2015 Medical Record Patient Account Number: [id number] Number: Afful, RN, BSN, Treating RN: [id number] (70 y.o. 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

## 2015-07-10 ENCOUNTER — Other Ambulatory Visit: Payer: Self-pay

## 2015-07-10 ENCOUNTER — Ambulatory Visit: Payer: Medicare Other

## 2015-07-10 ENCOUNTER — Inpatient Hospital Stay: Payer: Medicare Other | Attending: Oncology | Admitting: Oncology

## 2015-07-11 ENCOUNTER — Encounter: Payer: Medicare Other | Admitting: Surgery

## 2015-07-11 DIAGNOSIS — L89613 Pressure ulcer of right heel, stage 3: Secondary | ICD-10-CM | POA: Diagnosis not present

## 2015-07-12 NOTE — Progress Notes (Signed)
MCCREADY, Ceci H. ([id number]) Visit Report for 07/11/2015 Arrival Information Details Patient Name: UCCI, Telina H. Date of Service: 07/11/2015 4:00 PM Medical Record Number: [id number] Patient Account Number: [id number] Date of Birth/Sex: 11/25/1945 (69 y.o. Female) Treating RN: Cornell Barman Primary Care Physician: Paulita Cradle Other Clinician: Referring Physician: Paulita Cradle Treating Physician/Extender: Frann Rider in Treatment: 46 Visit Information History Since Last Visit Added or deleted any medications: No Patient Arrived: Wheel Chair Any new allergies or adverse reactions: No Arrival Time: 16:09 Had a fall or experienced change in No activities of daily living that may affect Accompanied By: husband risk of falls: Transfer Assistance: Manual Signs or symptoms of abuse/neglect since last No Patient Identification Verified: Yes visito Secondary Verification Process Yes Hospitalized since last visit: No Completed: Has Dressing in Place as Prescribed: Yes Patient Requires Transmission-Based No Has Compression in Place as Prescribed: Yes Precautions: Pain Present Now: No Patient Has Alerts: No Electronic Signature(s) Signed: 07/11/2015 5:55:30 PM By: Gretta Cool, RN, BSN, Kim RN, BSN Entered By: Gretta Cool, RN, BSN, Kim on 07/11/2015 16:13:39 Holstine, Mable H. ([id number]) -------------------------------------------------------------------------------- Encounter Discharge Information Details Patient Name: [name], Aleeza H. Date of Service: 07/11/2015 4:00 PM Medical Record Number: [id number] Patient Account Number: [id number] Date of Birth/Sex: 05-04-1946 (69 y.o. Female) Treating RN: Cornell Barman Primary Care Physician: Paulita Cradle Other Clinician: Referring Physician: Paulita Cradle Treating Physician/Extender: Frann Rider in Treatment: 17 Encounter Discharge Information Items Discharge Pain Level: 0 Discharge Condition: Stable Ambulatory  Status: Wheelchair Discharge Destination: Home Transportation: Private Auto Accompanied By: husband Schedule Follow-up Appointment: Yes Medication Reconciliation completed and provided to Patient/Care Yes Kieran Arreguin: Provided on Clinical Summary of Care: 07/11/2015 Form Type Recipient Paper Patient AS Electronic Signature(s) Signed: 07/11/2015 4:58:36 PM By: Ruthine Dose Entered By: Ruthine Dose on 07/11/2015 16:58:36 Devinney, Tenasia H. ([id number]) -------------------------------------------------------------------------------- Lower Extremity Assessment Details Patient Name: Liszewski, Anatalia H. Date of Service: 07/11/2015 4:00 PM Medical Record Number: [id number] Patient Account Number: [id number] Date of Birth/Sex: May 24, 1946 (69 y.o. Female) Treating RN: Cornell Barman Primary Care Physician: Paulita Cradle Other Clinician: Referring Physician: Paulita Cradle Treating Physician/Extender: Frann Rider in Treatment: 17 Vascular Assessment Pulses: Posterior Tibial Dorsalis Pedis Palpable: [Right:Yes] Extremity colors, hair growth, and conditions: Extremity Color: [Right:Normal] Hair Growth on Extremity: [Right:No] Temperature of Extremity: [Right:Warm] Capillary Refill: [Right:< 3 seconds] Toe Nail Assessment Left: Right: Thick: No Discolored: No Deformed: No Improper Length and Hygiene: No Electronic Signature(s) Signed: 07/11/2015 5:55:30 PM By: Gretta Cool, RN, BSN, Kim RN, BSN Entered By: Gretta Cool, RN, BSN, Kim on 07/11/2015 16:15:12 Wease, Airis H. ([id number]) -------------------------------------------------------------------------------- Multi Wound Chart Details Patient Name: [name], Shalyn H. Date of Service: 07/11/2015 4:00 PM Medical Record Number: [id number] Patient Account Number: [id number] Date of Birth/Sex: 1946/07/09 (69 y.o. 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] [3:Pressure Ulcer] [4:Pressure Ulcer] Comorbid History: [2:Cataracts, Asthma, Hypertension, Type II Diabetes, Neuropathy, Received Chemotherapy] [3:Cataracts, Asthma, Hypertension, Type II Diabetes, Neuropathy, Received Chemotherapy] [4:Cataracts, Asthma, Hypertension, Type II Diabetes,  Neuropathy, Received Chemotherapy] Date Acquired: [2:02/25/2015] [3:05/02/2015] [4:05/11/2015] Weeks of Treatment: [2:17] [3:10] [4:8] Wound Status: [2:Open] [3:Open] [4:Open] Measurements L x W x D 0.3x0.3x0.4 [  3:3.2x2.2x0.1] [4:0.9x0.6x0.1] (cm) Area (cm) : [2:0.071] [3:5.529] [4:0.424] Volume (cm) : [2:0.028] [3:0.553] [4:0.042] % Reduction in Area: [2:96.20%] [3:-2413.20%] [4:88.30%] % Reduction in Volume: 92.60% [3:-2413.60%] [4:88.40%] Classification: [2:Category/Stage III] [3:Category/Stage II] [4:Category/Stage III] HBO Classification: [2:N/A] [3:N/A] [4:Grade 2] Exudate Amount: [2:Small] [3:Medium] [4:Medium] Exudate Type: [2:Serous] [3:Serous] [4:Serous] Exudate Color: [2:amber] [3:amber] [4:amber] Wound Margin: [2:Flat and Intact] [3:Flat and Intact] [4:Flat and Intact] Granulation Amount: [2:Large (67-100%)] [3:Small (1-33%)] [4:Medium (34-66%)] Granulation Quality: [2:N/A] [3:Pink] [4:Pink] Necrotic Amount: [2:Small (1-33%)] [3:Large (67-100%)] [4:Small (1-33%)] Exposed Structures: [2:Fascia: No Fat: No Tendon: No Muscle: No] [3:Fascia: No Fat: No Tendon: No Muscle: No] [4:Fascia: No Fat: No Tendon: No  Muscle: No] Joint: No Joint: No Joint: No Bone: No Bone: No Bone: No Limited to Skin Limited to Skin Limited to Skin Breakdown Breakdown Breakdown Periwound Skin Texture: Edema: No Edema: No Edema: No Excoriation: No Excoriation: No Excoriation: No Induration: No Induration: No Induration: No Callus: No Callus: No Callus: No Crepitus: No Crepitus: No Crepitus: No Fluctuance: No Fluctuance: No Fluctuance: No Friable: No Friable: No Friable: No Rash: No Rash: No Rash: No Scarring: No Scarring: No Scarring: No Periwound Skin Maceration: No Maceration: No Maceration: No Moisture: Moist: No Moist: No Moist: No Dry/Scaly: No Dry/Scaly: No Dry/Scaly: No Periwound Skin Color: Atrophie Blanche: No Atrophie Blanche: No Atrophie Blanche: No Cyanosis: No Cyanosis: No Cyanosis: No Ecchymosis: No Ecchymosis: No Ecchymosis: No Erythema: No Erythema: No Erythema: No Hemosiderin Staining: No Hemosiderin Staining: No Hemosiderin Staining: No Mottled: No Mottled: No Mottled: No Pallor: No Pallor: No Pallor: No Rubor: No Rubor: No Rubor: No Tenderness on No No No Palpation: Wound Preparation: Ulcer Cleansing: Ulcer Cleansing: Ulcer Cleansing: Rinsed/Irrigated with Rinsed/Irrigated with Rinsed/Irrigated with Saline Saline Saline Topical Anesthetic Topical Anesthetic Topical Anesthetic Applied: None Applied: None Applied: Other: lidocaine 4% Treatment Notes Electronic Signature(s) Signed: 07/11/2015 5:55:30 PM By: Gretta Cool, RN, BSN, Kim RN, BSN Entered By: Gretta Cool, RN, BSN, Kim on 07/11/2015 16:26:53 Pontarelli, Aurea Lemmie Evens ([id number]) -------------------------------------------------------------------------------- Multi-Disciplinary Care Plan Details Patient Name: [name], Jadon H. Date of Service: 07/11/2015 4:00 PM Medical Record Number: [id number] Patient Account Number: [id number] Date of Birth/Sex: 1945-11-19 (69 y.o. Female) Treating RN: Cornell Barman Primary  Care Physician: Paulita Cradle Other Clinician: Referring Physician: Paulita Cradle Treating Physician/Extender: Frann Rider in Treatment: 77 Active Inactive Abuse / Safety / Falls / Self Care Management Nursing Diagnoses: Impaired home maintenance Impaired physical mobility Knowledge deficit related to: safety; personal, health (wound), emergency Potential for falls Self care deficit: actual or potential Goals: Patient will remain injury free Date Initiated: 03/13/2015 Goal Status: Active Patient/caregiver will verbalize understanding of skin care regimen Date Initiated: 03/13/2015 Goal Status: Active Patient/caregiver will verbalize/demonstrate measure taken to improve self care Date Initiated: 03/13/2015 Goal Status: Active Patient/caregiver will verbalize/demonstrate measures taken to improve the patient's personal safety Date Initiated: 03/13/2015 Goal Status: Active Patient/caregiver will verbalize/demonstrate measures taken to prevent injury and/or falls Date Initiated: 03/13/2015 Goal Status: Active Patient/caregiver will verbalize/demonstrate understanding of what to do in case of emergency Date Initiated: 03/13/2015 Goal Status: Active Interventions: Assess fall risk on admission and as needed Assess: immobility, friction, shearing, incontinence upon admission and as needed Assess impairment of mobility on admission and as needed per policy Assess self care needs on admission and as needed Provide education on basic hygiene [place], [place]. ([id number]) Provide education on fall prevention Provide education on personal and home safety Provide education on safe transfers Treatment Activities: Education provided on Basic Hygiene : 03/13/2015 Notes: Orientation  to the Wound Care Program Nursing Diagnoses: Knowledge deficit related to the wound healing center program Goals: Patient/caregiver will verbalize understanding of the [facility]  Program Date Initiated: 03/13/2015 Goal Status: Active Interventions: Provide education on orientation to the wound center Notes: Pressure Nursing Diagnoses: Knowledge deficit related to causes and risk factors for pressure ulcer development Knowledge deficit related to management of pressures ulcers Potential for impaired tissue integrity related to pressure, friction, moisture, and shear Goals: Patient will remain free from development of additional pressure ulcers Date Initiated: 03/13/2015 Goal Status: Active Patient will remain free of pressure ulcers Date Initiated: 03/13/2015 Goal Status: Active Patient/caregiver will verbalize risk factors for pressure ulcer development Date Initiated: 03/13/2015 Goal Status: Active Patient/caregiver will verbalize understanding of pressure ulcer management Date Initiated: 03/13/2015 Goal Status: Active Interventions: Assess: immobility, friction, shearing, incontinence upon admission and as needed Barbuto, Nahiara H. ([id number]) Assess offloading mechanisms upon admission and as needed Assess potential for pressure ulcer upon admission and as needed Provide education on pressure ulcers Treatment Activities: Patient referred for home evaluation of offloading devices/mattresses : 07/11/2015 Patient referred for pressure reduction/relief devices : 07/11/2015 Patient referred for seating evaluation to ensure proper offloading : 07/11/2015 Pressure reduction/relief device ordered : 07/11/2015 Test ordered outside of clinic : 07/11/2015 Notes: Wound/Skin Impairment Nursing Diagnoses: Impaired tissue integrity Knowledge deficit related to ulceration/compromised skin integrity Goals: Patient/caregiver will verbalize understanding of skin care regimen Date Initiated: 03/13/2015 Goal Status: Active Ulcer/skin breakdown will have a volume reduction of 30% by week 4 Date Initiated: 03/13/2015 Goal Status: Active Ulcer/skin breakdown will have a  volume reduction of 50% by week 8 Date Initiated: 03/13/2015 Goal Status: Active Ulcer/skin breakdown will have a volume reduction of 80% by week 12 Date Initiated: 03/13/2015 Goal Status: Active Ulcer/skin breakdown will heal within 14 weeks Date Initiated: 03/13/2015 Goal Status: Active Interventions: Assess patient/caregiver ability to obtain necessary supplies Assess patient/caregiver ability to perform ulcer/skin care regimen upon admission and as needed Assess ulceration(s) every visit Provide education on smoking Provide education on ulcer and skin care Treatment Activities: Patient referred to home care : 07/11/2015 [place], [place]. ([id number]) Referred to DME Chalese Peach for dressing supplies : 07/11/2015 Skin care regimen initiated : 07/11/2015 Topical wound management initiated : 07/11/2015 Notes: Electronic Signature(s) Signed: 07/11/2015 5:55:30 PM By: Gretta Cool, RN, BSN, Kim RN, BSN Entered By: Gretta Cool, RN, BSN, Kim on 07/11/2015 16:24:56 Le[place]elle, [place]. ([id number]) -------------------------------------------------------------------------------- Pain Assessment Details Patient Name: Cohick, Shadell H. Date of Service: 07/11/2015 4:00 PM Medical Record Number: [id number] Patient Account Number: [id number] Date of Birth/Sex: 18-Apr-1946 (69 y.o. Female) Treating RN: Cornell Barman Primary Care Physician: Paulita Cradle Other Clinician: Referring Physician: Paulita Cradle Treating Physician/Extender: Frann Rider in Treatment: 17 Active Problems Location of Pain Severity and Description of Pain Patient Has Paino No Site Locations Pain Management and Medication Current Pain Management: Electronic Signature(s) Signed: 07/11/2015 5:55:30 PM By: Gretta Cool, RN, BSN, Kim RN, BSN Entered By: Gretta Cool, RN, BSN, Kim on 07/11/2015 16:13:56 Escutia, Mykela Lemmie Evens ([id number]) -------------------------------------------------------------------------------- Patient/Caregiver Education  Details Patient Name: [name], Yizel H. Date of Service: 07/11/2015 4:00 PM Medical Record Number: [id number] Patient Account Number: [id number] Date of Birth/Gender: 1945-10-04 (69 y.o. Female) Treating RN: Cornell Barman Primary Care Physician: Paulita Cradle Other Clinician: Referring Physician: Paulita Cradle Treating Physician/Extender: Frann Rider in Treatment: 22 Education Assessment Education Provided To: Patient Education Topics Provided Wound/Skin Impairment: Handouts: Caring for Your Ulcer, Other: wound care as prescribed Methods: Demonstration, Explain/Verbal Responses: State content correctly Electronic  Signature(s) Signed: 07/11/2015 5:55:30 PM By: Gretta Cool, RN, BSN, Kim RN, BSN Entered By: Gretta Cool, RN, BSN, Kim on 07/11/2015 17:02:12 Wernert, Feven H. ([id number]) -------------------------------------------------------------------------------- Wound Assessment Details Patient Name: [name], Shomari H. Date of Service: 07/11/2015 4:00 PM Medical Record Number: [id number] Patient Account Number: [id number] Date of Birth/Sex: [birth year] (69 y.o. Female) Treating RN: Cornell Barman Primary Care Physician: Paulita Cradle Other Clinician: Referring Physician: Paulita Cradle Treating Physician/Extender: Frann Rider in Treatment: 17 Wound Status Wound Number: 2 Primary Pressure Ulcer Etiology: Wound Location: Right Elbow Wound Open Wounding Event: Gradually Appeared Status: Date Acquired: 02/25/2015 Comorbid Cataracts, Asthma, Hypertension, Type Weeks Of Treatment: 17 History: II Diabetes, Neuropathy, Received Clustered Wound: No Chemotherapy Photos Photo Uploaded By: Gretta Cool, RN, BSN, Kim on 07/11/2015 17:14:39 Wound Measurements Length: (cm) 0.3 Width: (cm) 0.3 Depth: (cm) 0.4 Area: (cm) 0.071 Volume: (cm) 0.028 % Reduction in Area: 96.2% % Reduction in Volume: 92.6% Wound Description Classification: Category/Stage III Wound Margin: Flat  and Intact Exudate Amount: Small Exudate Type: Serous Reeser, Rashida H. ([id number]) Exudate Color: amber Wound Bed Granulation Amount: Large (67-100%) Exposed Structure Necrotic Amount: Small (1-33%) Fascia Exposed: No Necrotic Quality: Adherent Slough Fat Layer Exposed: No Tendon Exposed: No Muscle Exposed: No Joint Exposed: No Bone Exposed: No Limited to Skin Breakdown Periwound Skin Texture Texture Color No Abnormalities Noted: No No Abnormalities Noted: No Callus: No Atrophie Blanche: No Crepitus: No Cyanosis: No Excoriation: No Ecchymosis: No Fluctuance: No Erythema: No Friable: No Hemosiderin Staining: No Induration: No Mottled: No Localized Edema: No Pallor: No Rash: No Rubor: No Scarring: No Moisture No Abnormalities Noted: No Dry / Scaly: No Maceration: No Moist: No Wound Preparation Ulcer Cleansing: Rinsed/Irrigated with Saline Topical Anesthetic Applied: None Treatment Notes Wound #2 (Right Elbow) 1. Cleansed with: Clean wound with Normal Saline 2. Anesthetic Topical Lidocaine 4% cream to wound bed prior to debridement 4. Dressing Applied: Aquacel Ag 5. Secondary Dressing Applied Bordered Foam Dressing Electronic Signature(s) Signed: 07/11/2015 5:55:30 PM By: Gretta Cool, RN, BSN, Kim RN, BSN Bordas, Aleiah HMarland Kitchen ([id number]) Entered By: Gretta Cool, RN, BSN, Kim on 07/11/2015 16:22:37 Piech, Belanna H. ([id number]) -------------------------------------------------------------------------------- Wound Assessment Details Patient Name: Marriott, Kesleigh H. Date of Service: 07/11/2015 4:00 PM Medical Record Number: [id number] Patient Account Number: [id number] Date of Birth/Sex: 11/07/1945 (69 y.o. Female) Treating RN: Cornell Barman Primary Care Physician: Paulita Cradle Other Clinician: Referring Physician: Paulita Cradle Treating Physician/Extender: Frann Rider in Treatment: 17 Wound Status Wound Number: 3 Primary Pressure Ulcer Etiology: Wound  Location: Back - Midline Wound Open Wounding Event: Pressure Injury Status: Date Acquired: 05/02/2015 Comorbid Cataracts, Asthma, Hypertension, Type Weeks Of Treatment: 10 History: II Diabetes, Neuropathy, Received Clustered Wound: No Chemotherapy Photos Photo Uploaded By: Gretta Cool, RN, BSN, Kim on 07/11/2015 17:14:51 Wound Measurements Length: (cm) 3.2 Width: (cm) 2.2 Depth: (cm) 0.1 Area: (cm) 5.529 Volume: (cm) 0.553 % Reduction in Area: -2413.2% % Reduction in Volume: -2413.6% Wound Description Classification: Category/Stage II Wound Margin: Flat and Intact Exudate Amount: Medium Exudate Type: Serous Riggin, Reena H. ([id number]) Exudate Color: amber Wound Bed Granulation Amount: Small (1-33%) Exposed Structure Granulation Quality: Pink Fascia Exposed: No Necrotic Amount: Large (67-100%) Fat Layer Exposed: No Necrotic Quality: Adherent Slough Tendon Exposed: No Muscle Exposed: No Joint Exposed: No Bone Exposed: No Limited to Skin Breakdown Periwound Skin Texture Texture Color No Abnormalities Noted: No No Abnormalities Noted: No Callus: No Atrophie Blanche: No Crepitus: No Cyanosis: No Excoriation: No Ecchymosis: No Fluctuance: No Erythema: No Friable: No Hemosiderin Staining: No Induration: No Mottled: No  Localized Edema: No Pallor: No Rash: No Rubor: No Scarring: No Moisture No Abnormalities Noted: No Dry / Scaly: No Maceration: No Moist: No Wound Preparation Ulcer Cleansing: Rinsed/Irrigated with Saline Topical Anesthetic Applied: None Treatment Notes Wound #3 (Midline Back) 1. Cleansed with: Clean wound with Normal Saline 2. Anesthetic Topical Lidocaine 4% cream to wound bed prior to debridement 3. Peri-wound Care: Barrier cream 4. Dressing Applied: Santyl Ointment 5. Secondary Dressing Applied Bordered Foam Dressing JALLOH, Mahlet H. ([id number]) Electronic Signature(s) Signed: 07/11/2015 5:55:30 PM By: Gretta Cool, RN, BSN, Kim RN,  BSN Entered By: Gretta Cool, RN, BSN, Kim on 07/11/2015 16:23:44 Thor, Hedy H. ([id number]) -------------------------------------------------------------------------------- Wound Assessment Details Patient Name: Betters, Stephania H. Date of Service: 07/11/2015 4:00 PM Medical Record Number: [id number] Patient Account Number: [id number] Date of Birth/Sex: 12/24/1945 (69 y.o. Female) Treating RN: Cornell Barman Primary Care Physician: Paulita Cradle Other Clinician: Referring Physician: Paulita Cradle Treating Physician/Extender: Frann Rider in Treatment: 17 Wound Status Wound Number: 4 Primary Pressure Ulcer Etiology: Wound Location: Right Calcaneous Wound Open Wounding Event: Pressure Injury Status: Date Acquired: 05/11/2015 Comorbid Cataracts, Asthma, Hypertension, Type Weeks Of Treatment: 8 History: II Diabetes, Neuropathy, Received Clustered Wound: No Chemotherapy Photos Photo Uploaded By: Gretta Cool, RN, BSN, Kim on 07/11/2015 17:42:33 Wound Measurements Length: (cm) 0.9 Width: (cm) 0.6 Depth: (cm) 0.1 Area: (cm) 0.424 Volume: (cm) 0.042 % Reduction in Area: 88.3% % Reduction in Volume: 88.4% Wound Description Classification: Category/Stage III Diabetic Severity Earleen Newport): Grade 2 Wound Margin: Flat and Intact Exudate Amount: Medium Exudate Type: Serous Exudate Color: amber Wound Bed Granulation Amount: Medium (34-66%) Exposed Structure Granulation Quality: Pink Fascia Exposed: No Necrotic Amount: Small (1-33%) Fat Layer Exposed: No Nordmeyer, Eunice H. ([id number]) Necrotic Quality: Adherent Slough Tendon Exposed: No Muscle Exposed: No Joint Exposed: No Bone Exposed: No Limited to Skin Breakdown Periwound Skin Texture Texture Color No Abnormalities Noted: No No Abnormalities Noted: No Callus: No Atrophie Blanche: No Crepitus: No Cyanosis: No Excoriation: No Ecchymosis: No Fluctuance: No Erythema: No Friable: No Hemosiderin Staining: No Induration:  No Mottled: No Localized Edema: No Pallor: No Rash: No Rubor: No Scarring: No Moisture No Abnormalities Noted: No Dry / Scaly: No Maceration: No Moist: No Wound Preparation Ulcer Cleansing: Rinsed/Irrigated with Saline Topical Anesthetic Applied: Other: lidocaine 4%, Treatment Notes Wound #4 (Right Calcaneous) 1. Cleansed with: Clean wound with Normal Saline 2. Anesthetic Topical Lidocaine 4% cream to wound bed prior to debridement 4. Dressing Applied: Aquacel Ag 5. Secondary Dressing Applied Bordered Foam Dressing Electronic Signature(s) Signed: 07/11/2015 5:55:30 PM By: Gretta Cool, RN, BSN, Kim RN, BSN Entered By: Gretta Cool, RN, BSN, Kim on 07/11/2015 16:24:42 Pucciarelli, Tita H. ([id number]) -------------------------------------------------------------------------------- Vitals Details Patient Name: [name], Chinenye H. Date of Service: 07/11/2015 4:00 PM Medical Record Number: [id number] Patient Account Number: [id number] Date of Birth/Sex: March 10, 1946 (69 y.o. Female) Treating RN: Cornell Barman Primary Care Physician: Paulita Cradle Other Clinician: Referring Physician: Paulita Cradle Treating Physician/Extender: Frann Rider in Treatment: 17 Vital Signs Time Taken: 15:14 Temperature (F): 98.0 Height (in): 65 Pulse (bpm): 95 Weight (lbs): 118 Respiratory Rate (breaths/min): 18 Body Mass Index (BMI): 19.6 Blood Pressure (mmHg): 134/73 Reference Range: 80 - 120 mg / dl Electronic Signature(s) Signed: 07/11/2015 5:55:30 PM By: Gretta Cool, RN, BSN, Kim RN, BSN Entered By: Gretta Cool, RN, BSN, Kim on 07/11/2015 16:14:22

## 2015-07-12 NOTE — Progress Notes (Signed)
[name], [name] Kitchen ([id number]) Visit Report for 07/11/2015 Chief Complaint Document Details Patient Name: [name], [name] 07/11/2015 4:00 Date of Service: PM Medical Record [id number] Number: Patient Account Number: [id number] 01/16/1946 (69 y.o. Treating RN: Cornell Barman Date of Birth/Sex: Female) Other Clinician: Primary Care Physician: Carrie Mew, MIRIAM Treating Christin Fudge Referring Physician: Paulita Cradle Physician/Extender: Suella Grove in Treatment: 17 Information Obtained from: Patient Chief Complaint Patient presents to the wound care center for a consult due non healing wound. Ulcers on the right elbow and the right heel for about 1 month. Electronic Signature(s) Signed: 07/11/2015 4:54:19 PM By: Christin Fudge MD, FACS Entered By: Christin Fudge on 07/11/2015 16:54:19 Monarrez, [name] Kitchen ([id number]) -------------------------------------------------------------------------------- HPI Details Patient Name: [name] 07/11/2015 4:00 Date of Service: PM Medical Record [id number] Number: Patient Account Number: [id number] 11-01-1945 (69 y.o. Treating RN: Cornell Barman Date of Birth/Sex: Female) Other Clinician: Primary Care Physician: Carrie Mew, MIRIAM Treating Christin Fudge Referring Physician: Paulita Cradle Physician/Extender: Suella Grove in Treatment: 17 History of Present Illness Location: Ulceration on the right heel and the right elbow. Quality: Patient reports experiencing a dull pain to affected area(s). Severity: Patient states wound (s) are getting better. Duration: Patient has had the wound for < 4 weeks prior to presenting for treatment Timing: Pain in wound is Intermittent (comes and goes Context: The wound appeared gradually over time Modifying Factors: Consults to this date include:Augmentin and Bactrim and also some heel protection with duoderm Associated Signs and Symptoms: Patient reports having difficulty standing for long periods. HPI Description:  69 year old female with history of peripheral neuropathy, history of diet controlled diabetes mellitus type 2, history of alcoholism here for wound consult sent by her PCP Dr. Sherilyn Cooter. She has pressure ulcers at her right elbow, bilateral heels. Plain films of right calcaneus without acute bony process. Patient started by PCP on Augmentin, Bactrim as per orders, DuoDerm dressings applied - reports some improvement in her ulcer since last seen. Denies fever, chills, nausea, vomiting, diarrhea. She had a right humerus fracture in the middle of May and has had no surgery and arm is in a sling. She is also been laying in the bed for quite a while. Past medical history significant for essential hypertension, osteoporosis, peripheral neuropathy, alcoholism, ataxia, personal history of breast cancer treated with surgery chemotherapy and radiation and this was done in December 2010. she is also status post laparoscopic cholecystectomy, pilonidal cyst excision, subcutaneous port placement, partial mastectomy on the left side, skin cancer removal. 03/21/2015 -- she says overall she's been doing better and continues to smoke about 15 cigarettes a day. 03/21/2015 - her orthopedic doctor has said she may require surgery for her right humerus fracture. 04/04/2015 -- her orthopedic surgery has been scheduled for August 11. 04/18/2015 -- she is doing fine as far as her elbow and her right heel goes but she has developed some redness over prominence on her thoracic spine and wanted me to take a look at this. 05/02/2015 -- she had her surgery done and now is in a sling and support. Her back has developed a pressure injury of undetermined stage. She seems to be in better spirits. 05/16/2015 -- last week her right heel was looking great and we had healed it out, but she has not been offloading appropriately and has a deep tissue injury on the right heel again. The area on her right elbow has opened out with slough and  the area in the thoracic spine is also getting worse. 05/30/2015 -- she has developed  2 new ulcerations one on her left ischial tuberosity and one on the sacral region. She is still working on getting up smoking but is also unable to take her vitamins as she says she Dimercurio, Sima H. ([id number]) develops a diarrhea when she takes vitamins. She has increased her intake of proteins. 06/06/2015 -- the patient's husband manages to get her a low air loss mattress with initiating pressure but did not know how to use it exactly and the patient was not happy about using it. Overall she says she's been feeling better. 07/11/2015 -- . Discussed a surgical opinion for debridement and application of a wound VAC, 2 weeks ago but the patient has been reluctant to get a surgical opinion as she wants to avoid surgery. Electronic Signature(s) Signed: 07/11/2015 4:55:15 PM By: Christin Fudge MD, FACS Entered By: Christin Fudge on 07/11/2015 16:55:15 [name], [name] ([id number]) -------------------------------------------------------------------------------- Physical Exam Details Patient Name: [name] 07/11/2015 4:00 Date of Service: PM Medical Record [id number] Number: Patient Account Number: [id number] [birth year] (69 y.o. Treating RN: Cornell Barman Date of Birth/Sex: Female) Other Clinician: Primary Care Physician: Carrie Mew, MIRIAM Treating Christin Fudge Referring Physician: Paulita Cradle Physician/Extender: Weeks in Treatment: 17 Constitutional . Pulse regular. Respirations normal and unlabored. Afebrile. . Eyes Nonicteric. Reactive to light. Ears, Nose, Mouth, and Throat Lips, teeth, and gums WNL.Marland Kitchen Moist mucosa without lesions . Neck supple and nontender. No palpable supraclavicular or cervical adenopathy. Normal sized without goiter. Respiratory WNL. No retractions.. Breath sounds WNL, No rubs, rales, rhonchi, or wheeze.. Cardiovascular Heart rhythm and rate regular, no murmur or  gallop.. Pedal Pulses WNL. No clubbing, cyanosis or edema. Chest Breasts symmetical and no nipple discharge.. Breast tissue WNL, no masses, lumps, or tenderness.. Lymphatic No adneopathy. No adenopathy. No adenopathy. Musculoskeletal Adexa without tenderness or enlargement.. Digits and nails w/o clubbing, cyanosis, infection, petechiae, ischemia, or inflammatory conditions.. Integumentary (Hair, Skin) No suspicious lesions. No crepitus or fluctuance. No peri-wound warmth or erythema. No masses.Marland Kitchen Psychiatric Judgement and insight Intact.. No evidence of depression, anxiety, or agitation.. Notes The right heel ulcer is fairly clean and we will continue treating this with silver alginate. The right elbow has some depth to it and it will need packing with silver alginate. The ulcerated area on the thoracic spine with full thickness tissue necrosis continues to be adherent to the fascia over the thoracic spine. This related sharp debridement in the operating room under anesthesia. Electronic Signature(s) Signed: 07/11/2015 4:58:33 PM By: Christin Fudge MD, FACS Entered By: Christin Fudge on 07/11/2015 16:58:33 [name], [name] ([id number]) [name], [name] ([id number]) -------------------------------------------------------------------------------- Physician Orders Details Patient Name: [name] 07/11/2015 4:00 Date of Service: PM Medical Record [id number] Number: Patient Account Number: [id number] 1946/01/28 (69 y.o. Treating RN: Cornell Barman Date of Birth/Sex: Female) Other Clinician: Primary Care Physician: Carrie Mew, MIRIAM Treating Galilee Pierron Referring Physician: Paulita Cradle Physician/Extender: Suella Grove in Treatment: 17 Verbal / Phone Orders: Yes Clinician: Cornell Barman Read Back and Verified: Yes Diagnosis Coding Wound Cleansing Wound #2 Right Elbow o Clean wound with Normal Saline. o May Shower, gently pat wound dry prior to applying new dressing. o No tub  bath. Wound #3 Midline Back o Clean wound with Normal Saline. o May Shower, gently pat wound dry prior to applying new dressing. o No tub bath. Wound #4 Right Calcaneous o Clean wound with Normal Saline. o May Shower, gently pat wound dry prior to applying new dressing. o No tub bath. Anesthetic Wound #2 Right Elbow o Topical Lidocaine 4%  cream applied to wound bed prior to debridement Wound #3 Midline Back o Topical Lidocaine 4% cream applied to wound bed prior to debridement Wound #4 Right Calcaneous o Topical Lidocaine 4% cream applied to wound bed prior to debridement Skin Barriers/Peri-Wound Care Wound #3 Midline Back o Barrier cream Primary Wound Dressing Wound #2 Right Elbow o Aquacel Ag - pack lightly into wound Wound #4 Right Calcaneous [name], Laloni H. ([id number]) o Aquacel Ag Wound #3 Midline Back o Santyl Ointment Secondary Dressing Wound #2 Right Elbow o Boardered Foam Dressing Wound #3 Midline Back o Boardered Foam Dressing Wound #4 Right Calcaneous o Boardered Foam Dressing Dressing Change Frequency Wound #2 Right Elbow o Change dressing every other day. Wound #4 Right Calcaneous o Change dressing every other day. Wound #3 Midline Back o Change dressing every day. Follow-up Appointments Wound #2 Right Elbow o Return Appointment in 1 week. Wound #3 Midline Back o Return Appointment in 1 week. Wound #4 Right Calcaneous o Return Appointment in 1 week. Off-Loading Wound #2 Right Elbow o Heel suspension boot to: - Sage boots heels o Turn and reposition every 2 hours o Mattress - patient refuses use of air mattress Wound #3 Midline Back o Heel suspension boot to: - Sage boots heels o Turn and reposition every 2 hours o Mattress - patient refuses use of air mattress Wound #4 Right Calcaneous o Heel suspension boot to: - Sage boots heels Ragain, Kenidy H. ([id number]) o Turn and reposition every  2 hours o Mattress - patient refuses use of air mattress Home Health Wound #2 Right Elbow o [place] Nurse may visit PRN to address patientos wound care needs. o FACE TO FACE ENCOUNTER: MEDICARE and MEDICAID PATIENTS: I certify that this patient is under my care and that I had a face-to-face encounter that meets the physician face-to-face encounter requirements with this patient on this date. The encounter with the patient was in whole or in part for the following MEDICAL CONDITION: (primary reason for [place]) MEDICAL NECESSITY: I certify, that based on my findings, NURSING services are a medically necessary home health service. HOME BOUND STATUS: I certify that my clinical findings support that this patient is homebound (i.e., Due to illness or injury, pt requires aid of supportive devices such as crutches, cane, wheelchairs, walkers, the use of special transportation or the assistance of another person to leave their place of residence. There is a normal inability to leave the home and doing so requires considerable and taxing effort. Other absences are for medical reasons / religious services and are infrequent or of short duration when for other reasons). o If current dressing causes regression in wound condition, may D/C ordered dressing product/s and apply Normal Saline Moist Dressing daily until next [place] / Other MD appointment. [place] of regression in wound condition at [phone]. o Please direct any NON-WOUND related issues/requests for orders to patient's Primary Care Physician Wound #3 Midline Back o [place] Nurse may visit PRN to address patientos wound care needs. o FACE TO FACE ENCOUNTER: MEDICARE and MEDICAID PATIENTS: I certify that this patient is under my care and that I had a face-to-face encounter that meets the physician  face-to-face encounter requirements with this patient on this date. The encounter with the patient was in whole or in part for the following MEDICAL CONDITION: (primary reason for [place]) MEDICAL NECESSITY: I certify, that based on my findings,  NURSING services are a medically necessary home health service. HOME BOUND STATUS: I certify that my clinical findings support that this patient is homebound (i.e., Due to illness or injury, pt requires aid of supportive devices such as crutches, cane, wheelchairs, walkers, the use of special transportation or the assistance of another person to leave their place of residence. There is a normal inability to leave the home and doing so requires considerable and taxing effort. Other absences are for medical reasons / religious services and are infrequent or of short duration when for other reasons). o If current dressing causes regression in wound condition, may D/C ordered dressing product/s and apply Normal Saline Moist Dressing daily until next [place] / Other MD appointment. [place] of regression in wound condition at [phone]. o Please direct any NON-WOUND related issues/requests for orders to patient's Primary Care Physician Wound #4 Right [place] Nurse may visit PRN to address patientos wound care needs. Yeske, Santos H. ([id number]) o FACE TO FACE ENCOUNTER: MEDICARE and MEDICAID PATIENTS: I certify that this patient is under my care and that I had a face-to-face encounter that meets the physician face-to-face encounter requirements with this patient on this date. The encounter with the patient was in whole or in part for the following MEDICAL CONDITION: (primary reason for [place]) MEDICAL NECESSITY: I certify, that based on my findings, NURSING services are a medically necessary home health service. HOME BOUND STATUS: I certify that my  clinical findings support that this patient is homebound (i.e., Due to illness or injury, pt requires aid of supportive devices such as crutches, cane, wheelchairs, walkers, the use of special transportation or the assistance of another person to leave their place of residence. There is a normal inability to leave the home and doing so requires considerable and taxing effort. Other absences are for medical reasons / religious services and are infrequent or of short duration when for other reasons). o If current dressing causes regression in wound condition, may D/C ordered dressing product/s and apply Normal Saline Moist Dressing daily until next [place] / Other MD appointment. [place] of regression in wound condition at [phone]. o Please direct any NON-WOUND related issues/requests for orders to patient's Primary Care Physician Electronic Signature(s) Signed: 07/11/2015 5:03:35 PM By: Christin Fudge MD, FACS Signed: 07/11/2015 5:55:30 PM By: Gretta Cool RN, BSN, Kim RN, BSN Entered By: Gretta Cool, RN, BSN, Kim on 07/11/2015 16:33:51 [name], [name] ([id number]) -------------------------------------------------------------------------------- Problem List Details Patient Name: [name] 07/11/2015 4:00 Date of Service: PM Medical Record [id number] Number: Patient Account Number: [id number] 25-Dec-1945 (69 y.o. Treating RN: Cornell Barman Date of Birth/Sex: Female) Other Clinician: Primary Care Physician: Carrie Mew, MIRIAM Treating Hanh Kertesz Referring Physician: Paulita Cradle Physician/Extender: Suella Grove in Treatment: 17 Active Problems ICD-10 Encounter Code Description Active Date Diagnosis E11.621 Type 2 diabetes mellitus with foot ulcer 03/13/2015 Yes L89.613 Pressure ulcer of right heel, stage 3 03/13/2015 Yes L89.013 Pressure ulcer of right elbow, stage 3 03/13/2015 Yes F17.218 Nicotine dependence, cigarettes, with other nicotine- 03/13/2015  Yes induced disorders L89.100 Pressure ulcer of unspecified part of back, unstageable 05/02/2015 Yes L89.222 Pressure ulcer of left hip, stage 2 05/30/2015 Yes L89.153 Pressure ulcer of sacral region, stage 3 05/30/2015 Yes Inactive Problems Resolved Problems Electronic Signature(s) Signed: 07/11/2015 4:54:12 PM By: Christin Fudge MD, FACS Entered By: Christin Fudge on 07/11/2015 16:54:11 [name], [name] ([id number]) -------------------------------------------------------------------------------- Progress Note Details Patient Name: [name],  Tyliah H. 07/11/2015 4:00 Date of Service: PM Medical Record [id number] Number: Patient Account Number: [id number] 09-11-1946 (69 y.o. Treating RN: Cornell Barman Date of Birth/Sex: Female) Other Clinician: Primary Care Physician: Carrie Mew, MIRIAM Treating Christin Fudge Referring Physician: Paulita Cradle Physician/Extender: Suella Grove in Treatment: 17 Subjective Chief Complaint Information obtained from Patient Patient presents to the wound care center for a consult due non healing wound. Ulcers on the right elbow and the right heel for about 1 month. History of Present Illness (HPI) The following HPI elements were documented for the patient's wound: Location: Ulceration on the right heel and the right elbow. Quality: Patient reports experiencing a dull pain to affected area(s). Severity: Patient states wound (s) are getting better. Duration: Patient has had the wound for < 4 weeks prior to presenting for treatment Timing: Pain in wound is Intermittent (comes and goes Context: The wound appeared gradually over time Modifying Factors: Consults to this date include:Augmentin and Bactrim and also some heel protection with duoderm Associated Signs and Symptoms: Patient reports having difficulty standing for long periods. 69 year old female with history of peripheral neuropathy, history of diet controlled diabetes mellitus type 2, history of alcoholism  here for wound consult sent by her PCP Dr. Sherilyn Cooter. She has pressure ulcers at her right elbow, bilateral heels. Plain films of right calcaneus without acute bony process. Patient started by PCP on Augmentin, Bactrim as per orders, DuoDerm dressings applied - reports some improvement in her ulcer since last seen. Denies fever, chills, nausea, vomiting, diarrhea. She had a right humerus fracture in the middle of May and has had no surgery and arm is in a sling. She is also been laying in the bed for quite a while. Past medical history significant for essential hypertension, osteoporosis, peripheral neuropathy, alcoholism, ataxia, personal history of breast cancer treated with surgery chemotherapy and radiation and this was done in December 2010. she is also status post laparoscopic cholecystectomy, pilonidal cyst excision, subcutaneous port placement, partial mastectomy on the left side, skin cancer removal. 03/21/2015 -- she says overall she's been doing better and continues to smoke about 15 cigarettes a day. 03/21/2015 - her orthopedic doctor has said she may require surgery for her right humerus fracture. 04/04/2015 -- her orthopedic surgery has been scheduled for August 11. 04/18/2015 -- she is doing fine as far as her elbow and her right heel goes but she has developed some Gillison, Alisah H. ([id number]) redness over prominence on her thoracic spine and wanted me to take a look at this. 05/02/2015 -- she had her surgery done and now is in a sling and support. Her back has developed a pressure injury of undetermined stage. She seems to be in better spirits. 05/16/2015 -- last week her right heel was looking great and we had healed it out, but she has not been offloading appropriately and has a deep tissue injury on the right heel again. The area on her right elbow has opened out with slough and the area in the thoracic spine is also getting worse. 05/30/2015 -- she has developed 2 new ulcerations  one on her left ischial tuberosity and one on the sacral region. She is still working on getting up smoking but is also unable to take her vitamins as she says she develops a diarrhea when she takes vitamins. She has increased her intake of proteins. 06/06/2015 -- the patient's husband manages to get her a low air loss mattress with initiating pressure but did not know how to use it exactly  and the patient was not happy about using it. Overall she says she's been feeling better. 07/11/2015 -- . Discussed a surgical opinion for debridement and application of a wound VAC, 2 weeks ago but the patient has been reluctant to get a surgical opinion as she wants to avoid surgery. Objective Constitutional Pulse regular. Respirations normal and unlabored. Afebrile. Vitals Time Taken: 3:14 PM, Height: 65 in, Weight: 118 lbs, BMI: 19.6, Temperature: 98.0 F, Pulse: 95 bpm, Respiratory Rate: 18 breaths/min, Blood Pressure: 134/73 mmHg. Eyes Nonicteric. Reactive to light. Ears, Nose, Mouth, and Throat Lips, teeth, and gums WNL.Marland Kitchen Moist mucosa without lesions . Neck supple and nontender. No palpable supraclavicular or cervical adenopathy. Normal sized without goiter. Respiratory WNL. No retractions.. Breath sounds WNL, No rubs, rales, rhonchi, or wheeze.. Cardiovascular Heart rhythm and rate regular, no murmur or gallop.. Pedal Pulses WNL. No clubbing, cyanosis or edema. Chest Breasts symmetical and no nipple discharge.. Breast tissue WNL, no masses, lumps, or tenderness.. Lymphatic No adneopathy. No adenopathy. No adenopathy. Kondracki, Rosely H. ([id number]) Musculoskeletal Adexa without tenderness or enlargement.. Digits and nails w/o clubbing, cyanosis, infection, petechiae, ischemia, or inflammatory conditions.Marland Kitchen Psychiatric Judgement and insight Intact.. No evidence of depression, anxiety, or agitation.. General Notes: The right heel ulcer is fairly clean and we will continue treating this with  silver alginate. The right elbow has some depth to it and it will need packing with silver alginate. The ulcerated area on the thoracic spine with full thickness tissue necrosis continues to be adherent to the fascia over the thoracic spine. This related sharp debridement in the operating room under anesthesia. Integumentary (Hair, Skin) No suspicious lesions. No crepitus or fluctuance. No peri-wound warmth or erythema. No masses.. Wound #2 status is Open. Original cause of wound was Gradually Appeared. The wound is located on the Right Elbow. The wound measures 0.3cm length x 0.3cm width x 0.4cm depth; 0.071cm^2 area and 0.028cm^3 volume. The wound is limited to skin breakdown. There is a small amount of serous drainage noted. The wound margin is flat and intact. There is large (67-100%) granulation within the wound bed. There is a small (1-33%) amount of necrotic tissue within the wound bed including Adherent Slough. The periwound skin appearance did not exhibit: Callus, Crepitus, Excoriation, Fluctuance, Friable, Induration, Localized Edema, Rash, Scarring, Dry/Scaly, Maceration, Moist, Atrophie Blanche, Cyanosis, Ecchymosis, Hemosiderin Staining, Mottled, Pallor, Rubor, Erythema. Wound #3 status is Open. Original cause of wound was Pressure Injury. The wound is located on the Midline Back. The wound measures 3.2cm length x 2.2cm width x 0.1cm depth; 5.529cm^2 area and 0.553cm^3 volume. The wound is limited to skin breakdown. There is a medium amount of serous drainage noted. The wound margin is flat and intact. There is small (1-33%) pink granulation within the wound bed. There is a large (67-100%) amount of necrotic tissue within the wound bed including Adherent Slough. The periwound skin appearance did not exhibit: Callus, Crepitus, Excoriation, Fluctuance, Friable, Induration, Localized Edema, Rash, Scarring, Dry/Scaly, Maceration, Moist, Atrophie Blanche, Cyanosis,  Ecchymosis, Hemosiderin Staining, Mottled, Pallor, Rubor, Erythema. Wound #4 status is Open. Original cause of wound was Pressure Injury. The wound is located on the Right Calcaneous. The wound measures 0.9cm length x 0.6cm width x 0.1cm depth; 0.424cm^2 area and 0.042cm^3 volume. The wound is limited to skin breakdown. There is a medium amount of serous drainage noted. The wound margin is flat and intact. There is medium (34-66%) pink granulation within the wound bed. There is a small (1-33%) amount of necrotic  tissue within the wound bed including Adherent Slough. The periwound skin appearance did not exhibit: Callus, Crepitus, Excoriation, Fluctuance, Friable, Induration, Localized Edema, Rash, Scarring, Dry/Scaly, Maceration, Moist, Atrophie Blanche, Cyanosis, Ecchymosis, Hemosiderin Staining, Mottled, Pallor, Rubor, Erythema. Assessment [name], [name] H. 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 The right elbow has some depth to it and it will need packing with silver alginate. 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 This will require sharp debridement in the operating room under anesthesia. The patient has been grasping on to the hope that this will get better without surgical intervention. I have explained it to her and her husband, in no uncertain terms, that in my professional opinion, she would need surgical  debridement and application of a wound VAC in the operating room. She should see a Psychologist, sport and exercise for a opinion and their choice was to see Dr. Ananias Pilgrim. All questions answered and they understand my treatment plan. They will decide how they want to proceed until then we will continue with conservative care and see her back next week. Plan Wound Cleansing: Wound #2 Right Elbow: Clean wound with Normal Saline. May Shower, gently pat wound dry prior to applying new dressing. No tub bath. Wound #3 Midline Back: Clean wound with Normal Saline. May Shower, gently pat wound dry prior to applying new dressing. No tub bath. Wound #4 Right Calcaneous: Clean wound with Normal Saline. May Shower, gently pat wound dry prior to applying new dressing. No tub bath. Anesthetic: Wound #2 Right Elbow: Topical Lidocaine 4% cream applied to wound bed prior to debridement Wound #3 Midline Back: [place], Kaylea H. ([id number]) Topical Lidocaine 4% cream applied to wound bed prior to debridement Wound #4 Right Calcaneous: Topical Lidocaine 4% cream applied to wound bed prior to debridement Skin Barriers/Peri-Wound Care: Wound #3 Midline Back: Barrier cream Primary Wound Dressing: Wound #2 Right Elbow: Aquacel Ag - pack lightly into wound Wound #4 Right Calcaneous: Aquacel Ag Wound #3 Midline Back: Santyl Ointment Secondary Dressing: Wound #2 Right Elbow: Boardered Foam Dressing Wound #3 Midline Back: Boardered Foam Dressing Wound #4 Right Calcaneous: Boardered Foam Dressing Dressing Change Frequency: Wound #2 Right Elbow: Change dressing every other day. Wound #4 Right Calcaneous: Change dressing every other day. Wound #3 Midline Back: Change dressing every day. Follow-up Appointments: Wound #2 Right Elbow: Return Appointment in 1 week. Wound #3 Midline Back: Return Appointment in 1 week. Wound #4 Right Calcaneous: Return Appointment in 1 week. Off-Loading: Wound #2 Right Elbow: Heel  suspension boot to: - Sage boots heels Turn and reposition every 2 hours Mattress - patient refuses use of air mattress Wound #3 Midline Back: Heel suspension boot to: - Sage boots heels Turn and reposition every 2 hours Mattress - patient refuses use of air mattress Wound #4 Right Calcaneous: Heel suspension boot to: - Sage boots heels Turn and reposition every 2 hours Mattress - patient refuses use of air mattress Home Health: Wound #2 Right Elbow: Continue  [place] Nurse may visit PRN to address patient s wound care needs. Seats, Simisola H. ([id number]) FACE TO FACE ENCOUNTER: MEDICARE and MEDICAID PATIENTS: I certify that this patient is under my care and that I had a face-to-face encounter that meets the physician face-to-face encounter requirements with this patient on this date. The encounter with the patient was in whole or in part for the following MEDICAL CONDITION: (primary reason for [place]) MEDICAL NECESSITY: I certify, that based on my findings, NURSING services are a medically necessary home health service. HOME BOUND STATUS: I certify that my clinical findings support that this patient is homebound (i.e., Due to illness or injury, pt requires aid of supportive devices such as crutches, cane, wheelchairs, walkers, the use of special transportation or the assistance of another person to leave their place of residence. There is a normal inability to leave the home and doing so requires considerable and taxing effort. Other absences are for medical reasons / religious services and are infrequent or of short duration when for other reasons). If current dressing causes regression in wound condition, may D/C ordered dressing product/s and apply Normal Saline Moist Dressing daily until next [place] / Other MD appointment. [place] of regression in wound condition at [phone]. Please direct any NON-WOUND related  issues/requests for orders to patient's Primary Care Physician Wound #3 Midline Back: [place] Nurse may visit PRN to address patient s wound care needs. FACE TO FACE ENCOUNTER: MEDICARE and MEDICAID PATIENTS: I certify that this patient is under my care and that I had a face-to-face encounter that meets the physician face-to-face encounter requirements with this patient on this date. The encounter with the patient was in whole or in part for the following MEDICAL CONDITION: (primary reason for [place]) MEDICAL NECESSITY: I certify, that based on my findings, NURSING services are a medically necessary home health service. HOME BOUND STATUS: I certify that my clinical findings support that this patient is homebound (i.e., Due to illness or injury, pt requires aid of supportive devices such as crutches, cane, wheelchairs, walkers, the use of special transportation or the assistance of another person to leave their place of residence. There is a normal inability to leave the home and doing so requires considerable and taxing effort. Other absences are for medical reasons / religious services and are infrequent or of short duration when for other reasons). If current dressing causes regression in wound condition, may D/C ordered dressing product/s and apply Normal Saline Moist Dressing daily until next [place] / Other MD appointment. [place] of regression in wound condition at [phone]. Please direct any NON-WOUND related issues/requests for orders to patient's Primary Care Physician Wound #4 Right Calcaneous: [place] Nurse may visit PRN to address patient s wound care needs. FACE TO FACE ENCOUNTER: MEDICARE and MEDICAID PATIENTS: I certify that this patient is under my care and that I had a face-to-face encounter that meets the physician face-to-face encounter requirements with this patient  on this date. The encounter with the patient was in whole or in part for the following MEDICAL CONDITION: (primary reason for [place]) MEDICAL NECESSITY: I certify, that based on my findings, NURSING services are a medically necessary home health service. HOME BOUND STATUS: I certify that my clinical findings support that this patient is homebound (i.e., Due to illness or injury, pt requires aid of supportive devices such  as crutches, cane, wheelchairs, walkers, the use of special transportation or the assistance of another person to leave their place of residence. There is a normal inability to leave the home and doing so requires considerable and taxing effort. Other absences are for medical reasons / religious services and are infrequent or of short duration when for other reasons). If current dressing causes regression in wound condition, may D/C ordered dressing product/s and apply Normal Saline Moist Dressing daily until next [place] / Other MD appointment. [place] of regression in wound condition at [phone]. Please direct any NON-WOUND related issues/requests for orders to patient's Primary Care Physician [place], [place]. ([id number]) The right heel ulcer is fairly clean and we will continue treating this with silver alginate. The right elbow has some depth to it and it will need packing with silver alginate. 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 This will require sharp debridement in the operating room under anesthesia. The patient has been grasping on to the hope that this will get better without surgical intervention. I have explained it to her and her husband, in no uncertain terms, that in my professional opinion, she would need surgical debridement and application of a wound VAC in the operating room. She should see a Psychologist, sport and exercise for a  opinion and their choice was to see Dr. Ananias Pilgrim. All questions answered and they understand my treatment plan. They will decide how they want to proceed until then we will continue with conservative care and see her back next week. Electronic Signature(s) Signed: 07/11/2015 5:03:08 PM By: Christin Fudge MD, FACS Entered By: Christin Fudge on 07/11/2015 17:03:08 Appelbaum, Tayia H. ([id number]) -------------------------------------------------------------------------------- SuperBill Details Patient Name: [name], Jason H. Date of Service: 07/11/2015 Medical Record Number: [id number] Patient Account Number: [id number] Date of Birth/Sex: [birth year] (69 y.o. 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, stage 3 F17.218 Nicotine dependence, cigarettes, with other nicotine-induced disorders L89.100 Pressure ulcer of unspecified part of back, unstageable L89.222 Pressure ulcer of left hip, stage 2 L89.153 Pressure ulcer of sacral region, stage 3 Physician Procedures CPT4 Code: [id number] Description: 68372 - WC PHYS LEVEL 4 - EST PT ICD-10 Description Diagnosis 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, 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

## 2015-07-18 ENCOUNTER — Encounter: Payer: Medicare Other | Attending: Surgery | Admitting: Surgery

## 2015-07-18 DIAGNOSIS — L89100 Pressure ulcer of unspecified part of back, unstageable: Secondary | ICD-10-CM | POA: Diagnosis not present

## 2015-07-18 DIAGNOSIS — I10 Essential (primary) hypertension: Secondary | ICD-10-CM | POA: Insufficient documentation

## 2015-07-18 DIAGNOSIS — G629 Polyneuropathy, unspecified: Secondary | ICD-10-CM | POA: Diagnosis not present

## 2015-07-18 DIAGNOSIS — F17218 Nicotine dependence, cigarettes, with other nicotine-induced disorders: Secondary | ICD-10-CM | POA: Diagnosis not present

## 2015-07-18 DIAGNOSIS — E11621 Type 2 diabetes mellitus with foot ulcer: Secondary | ICD-10-CM | POA: Insufficient documentation

## 2015-07-18 DIAGNOSIS — L89222 Pressure ulcer of left hip, stage 2: Secondary | ICD-10-CM | POA: Diagnosis not present

## 2015-07-18 DIAGNOSIS — M1990 Unspecified osteoarthritis, unspecified site: Secondary | ICD-10-CM | POA: Diagnosis not present

## 2015-07-18 DIAGNOSIS — L89153 Pressure ulcer of sacral region, stage 3: Secondary | ICD-10-CM | POA: Insufficient documentation

## 2015-07-18 DIAGNOSIS — F1021 Alcohol dependence, in remission: Secondary | ICD-10-CM | POA: Diagnosis not present

## 2015-07-18 DIAGNOSIS — L89613 Pressure ulcer of right heel, stage 3: Secondary | ICD-10-CM | POA: Diagnosis not present

## 2015-07-18 DIAGNOSIS — Z853 Personal history of malignant neoplasm of breast: Secondary | ICD-10-CM | POA: Diagnosis not present

## 2015-07-18 DIAGNOSIS — L89013 Pressure ulcer of right elbow, stage 3: Secondary | ICD-10-CM | POA: Diagnosis not present

## 2015-07-19 NOTE — Progress Notes (Signed)
[name], [name] ([id number]) Visit Report for 07/18/2015 Arrival Information Details Patient Name: [name], [name] Date of Service: 07/18/2015 2:15 PM Medical Record Number: [id number] Patient Account Number: [id number] Date of Birth/Sex: 1946/06/12 (69 y.o. Female) Treating RN: [name] Primary Care Physician: [name] Other Clinician: Referring Physician: Paulita [name] Treating Physician/Extender: [name] in Treatment: 18 Visit Information History Since Last Visit Added or deleted any medications: No Patient Arrived: Wheel Chair Any new allergies or adverse reactions: No Arrival Time: 14:17 Had a fall or experienced change in No activities of daily living that may affect Accompanied By: husband risk of falls: Transfer Assistance: Manual Signs or symptoms of abuse/neglect since last No Patient Identification Verified: Yes visito Secondary Verification Process Yes Hospitalized since last visit: No Completed: Has Dressing in Place as Prescribed: Yes Patient Requires Transmission-Based No Pain Present Now: Yes Precautions: Patient Has Alerts: No Electronic Signature(s) Signed: 07/18/2015 4:42:28 PM By: Mckenzie Cool, RN, BSN, Kim RN, BSN Entered By: Mckenzie Cool, RN, BSN, [name] on 07/18/2015 14:17:47 [name], [name] ([id number]) -------------------------------------------------------------------------------- Encounter Discharge Information Details Patient Name: [name], [name] Date of Service: 07/18/2015 2:15 PM Medical Record Number: [id number] Patient Account Number: [id number] Date of Birth/Sex: 08/30/1946 (69 y.o. Female) Treating RN: [name] Primary Care Physician: [name] Other Clinician: Referring Physician: Paulita [name] Treating Physician/Extender: [name] in Treatment: 7 Encounter Discharge Information Items Discharge Pain Level: 0 Discharge Condition: Stable Ambulatory Status: Walker Discharge Destination:  Home Transportation: Private Auto Accompanied By: self Schedule Follow-up Appointment: Yes Medication Reconciliation completed Yes and provided to Patient/Care [name]: Provided on Clinical Summary of Care: 07/18/2015 Form Type Recipient Paper Patient AS Electronic Signature(s) Signed: 07/18/2015 4:42:28 PM By: Mckenzie Cool RN, BSN, Kim RN, BSN Previous Signature: 07/18/2015 2:55:48 PM Version By: Ruthine Dose Entered By: Mckenzie Cool RN, BSN, [name] on 07/18/2015 15:03:13 [name], [name] ([id number]) -------------------------------------------------------------------------------- Lower Extremity Assessment Details Patient Name: [name], [name] Date of Service: 07/18/2015 2:15 PM Medical Record Number: [id number] Patient Account Number: [id number] Date of Birth/Sex: Jul 13, 1946 (69 y.o. Female) Treating RN: [name] Primary Care Physician: [name] Other Clinician: Referring Physician: Paulita [name] Treating Physician/Extender: [name] in Treatment: 18 Vascular Assessment Pulses: Posterior Tibial Dorsalis Pedis Palpable: [Right:Yes] Extremity colors, hair growth, and conditions: Extremity Color: [Right:Normal] Hair Growth on Extremity: [Right:No] Temperature of Extremity: [Right:[name]] Capillary Refill: [Right:< 3 seconds] Toe Nail Assessment Left: Right: Thick: Yes Discolored: Yes Deformed: Yes Improper Length and Hygiene: Yes Electronic Signature(s) Signed: 07/18/2015 4:42:28 PM By: Mckenzie Cool, RN, BSN, Kim RN, BSN Entered By: Mckenzie Cool, RN, BSN, [name] on 07/18/2015 14:22:03 [name], [name] ([id number]) -------------------------------------------------------------------------------- Multi Wound Chart Details Patient Name: [name], [name] Date of Service: 07/18/2015 2:15 PM Medical Record Number: [id number] Patient Account Number: [id number] Date of Birth/Sex: 18-Jan-1946 (69 y.o. Female) Treating RN: [name] Primary Care Physician: [name] Other  Clinician: Referring Physician: Paulita [name] Treating Physician/Extender: [name] in Treatment: 18 Vital Signs Height(in): 65 Pulse(bpm): 87 Weight(lbs): 118 Blood Pressure 124/67 (mmHg): Body Mass Index(BMI): 20 Temperature(F): 98.0 Respiratory Rate 18 (breaths/min): Photos: [2:No Photos] [3:No Photos] [4:No Photos] Wound Location: [2:Right Elbow] [3:Midline Back] [4:Right Calcaneous] Wounding Event: [2:Gradually Appeared] [3:Pressure Injury] [4:Pressure Injury] Primary Etiology: [2:Pressure Ulcer] [3:Pressure Ulcer] [4:Pressure Ulcer] Date Acquired: [2:02/25/2015] [3:05/02/2015] [4:05/11/2015] Weeks of Treatment: [2:18] [3:11] [4:9] Wound Status: [2:Open] [3:Open] [4:Open] Measurements L x W x D 0.1x0.1x0.2 [3:3.2x2.2x0.1] [4:0.4x0.5x0.1] (cm) Area (cm) : [2:0.008] [3:5.529] [4:0.157] Volume (cm) : [2:0.002] [3:0.553] [4:0.016] % Reduction in Area: [2:99.60%] [3:-2413.20%] [4:95.70%] %  Reduction in Volume: 99.50% [3:-2413.60%] [4:95.60%] Classification: [2:Category/Stage III] [3:Category/Stage II] [4:Category/Stage III] Periwound Skin Texture: No Abnormalities Noted [3:No Abnormalities Noted] [4:No Abnormalities Noted] Periwound Skin [2:No Abnormalities Noted] [3:No Abnormalities Noted] [4:No Abnormalities Noted] Moisture: Periwound Skin Color: No Abnormalities Noted [3:No Abnormalities Noted] [4:No Abnormalities Noted] Tenderness on [2:No] [3:No] [4:No] Treatment Notes Electronic Signature(s) Signed: 07/18/2015 4:42:28 PM By: Mckenzie Cool, RN, BSN, Kim RN, BSN Entered By: Mckenzie Cool, RN, BSN, [name] on 07/18/2015 14:31:40 [name], [name] ([id number]) -------------------------------------------------------------------------------- [place] Details Patient Name: [name], [name] Date of Service: 07/18/2015 2:15 PM Medical Record Number: [id number] Patient Account Number: [id number] Date of Birth/Sex: 1946-03-14 (69 y.o. Female) Treating RN: [name] Primary Care Physician: [name] Other Clinician: Referring Physician: Paulita [name] Treating Physician/Extender: [name] in Treatment: 10 Active Inactive Abuse / Safety / Falls / Self Care Management Nursing Diagnoses: Impaired home maintenance Impaired physical mobility Knowledge deficit related to: safety; personal, health (wound), emergency Potential for falls Self care deficit: actual or potential Goals: Patient will remain injury free Date Initiated: 03/13/2015 Goal Status: Active Patient/caregiver will verbalize understanding of skin care regimen Date Initiated: 03/13/2015 Goal Status: Active Patient/caregiver will verbalize/demonstrate measure taken to improve self care Date Initiated: 03/13/2015 Goal Status: Active Patient/caregiver will verbalize/demonstrate measures taken to improve the patient's personal safety Date Initiated: 03/13/2015 Goal Status: Active Patient/caregiver will verbalize/demonstrate measures taken to prevent injury and/or falls Date Initiated: 03/13/2015 Goal Status: Active Patient/caregiver will verbalize/demonstrate understanding of what to do in case of emergency Date Initiated: 03/13/2015 Goal Status: Active Interventions: Assess fall risk on admission and as needed Assess: immobility, friction, shearing, incontinence upon admission and as needed Assess impairment of mobility on admission and as needed per policy Assess self care needs on admission and as needed Provide education on basic hygiene Seidner, Loomis. ([id number]) Provide education on fall prevention Provide education on personal and home safety Provide education on safe transfers Treatment Activities: Education provided on Basic Hygiene : 03/13/2015 Notes: Orientation to the Wound Care Program Nursing Diagnoses: Knowledge deficit related to the wound healing center program Goals: Patient/caregiver will verbalize understanding of the [facility] Program Date Initiated: 03/13/2015 Goal Status: Active Interventions: Provide education on orientation to the wound center Notes: Pressure Nursing Diagnoses: Knowledge deficit related to causes and risk factors for pressure ulcer development Knowledge deficit related to management of pressures ulcers Potential for impaired tissue integrity related to pressure, friction, moisture, and shear Goals: Patient will remain free from development of additional pressure ulcers Date Initiated: 03/13/2015 Goal Status: Active Patient will remain free of pressure ulcers Date Initiated: 03/13/2015 Goal Status: Active Patient/caregiver will verbalize risk factors for pressure ulcer development Date Initiated: 03/13/2015 Goal Status: Active Patient/caregiver will verbalize understanding of pressure ulcer management Date Initiated: 03/13/2015 Goal Status: Active Interventions: Assess: immobility, friction, shearing, incontinence upon admission and as needed [name], [name] ([id number]) Assess offloading mechanisms upon admission and as needed Assess potential for pressure ulcer upon admission and as needed Provide education on pressure ulcers Treatment Activities: Patient referred for home evaluation of offloading devices/mattresses : 07/18/2015 Patient referred for pressure reduction/relief devices : 07/18/2015 Patient referred for seating evaluation to ensure proper offloading : 07/18/2015 Pressure reduction/relief device ordered : 07/18/2015 Test ordered outside of clinic : 07/18/2015 Notes: Wound/Skin Impairment Nursing Diagnoses: Impaired tissue integrity Knowledge deficit related to ulceration/compromised skin integrity Goals: Patient/caregiver will verbalize understanding of skin care regimen Date Initiated: 03/13/2015 Goal Status: Active Ulcer/skin breakdown will have a volume reduction of  30% by week 4 Date Initiated: 03/13/2015 Goal Status: Active Ulcer/skin breakdown will have a  volume reduction of 50% by week 8 Date Initiated: 03/13/2015 Goal Status: Active Ulcer/skin breakdown will have a volume reduction of 80% by week 12 Date Initiated: 03/13/2015 Goal Status: Active Ulcer/skin breakdown will heal within 14 weeks Date Initiated: 03/13/2015 Goal Status: Active Interventions: Assess patient/caregiver ability to obtain necessary supplies Assess patient/caregiver ability to perform ulcer/skin care regimen upon admission and as needed Assess ulceration(s) every visit Provide education on smoking Provide education on ulcer and skin care Treatment Activities: Patient referred to home care : 07/18/2015 WOODING, Mckenzie. ([id number]) Referred to DME Reginal Wojcicki for dressing supplies : 07/18/2015 Skin care regimen initiated : 07/18/2015 Topical wound management initiated : 07/18/2015 Notes: Electronic Signature(s) Signed: 07/18/2015 4:42:28 PM By: Mckenzie Cool, RN, BSN, Kim RN, BSN Entered By: Mckenzie Cool, RN, BSN, [name] on 07/18/2015 14:31:34 [name], Mckenzie Spring. ([id number]) -------------------------------------------------------------------------------- Pain Assessment Details Patient Name: [name], [name] Date of Service: 07/18/2015 2:15 PM Medical Record Number: [id number] Patient Account Number: [id number] Date of Birth/Sex: 1946/01/05 (69 y.o. Female) Treating RN: [name] Primary Care Physician: [name] Other Clinician: Referring Physician: Paulita [name] Treating Physician/Extender: [name] in Treatment: 18 Active Problems Location of Pain Severity and Description of Pain Patient Has Paino Yes Site Locations Pain Location: Pain in Ulcers Rate the pain. Current Pain Level: 5 Worst Pain Level: 4 Character of Pain Describe the Pain: Aching, Shooting Pain Management and Medication Current Pain Management: Electronic Signature(s) Signed: 07/18/2015 4:42:28 PM By: Mckenzie Cool, RN, BSN, Kim RN, BSN Entered By: Mckenzie Cool, RN, BSN, [name] on 07/18/2015  14:20:39 Wenker, Mckenzie HMarland Kitchen ([id number]) -------------------------------------------------------------------------------- Patient/Caregiver Education Details Patient Name: [name], Elveria H. Date of Service: 07/18/2015 2:15 PM Medical Record Number: [id number] Patient Account Number: [id number] Date of Birth/Gender: 09/27/1945 (69 y.o. Female) Treating RN: [name] Primary Care Physician: [name] Other Clinician: Referring Physician: Paulita [name] Treating Physician/Extender: [name] in Treatment: 27 Education Assessment Education Provided To: Patient Education Topics Provided Wound/Skin Impairment: Handouts: Caring for Your Ulcer Methods: Demonstration, Explain/Verbal Responses: State content correctly Electronic Signature(s) Signed: 07/18/2015 4:42:28 PM By: Mckenzie Cool, RN, BSN, Kim RN, BSN Entered By: Mckenzie Cool, RN, BSN, [name] on 07/18/2015 15:03:29 Walthers, Trish H. ([id number]) -------------------------------------------------------------------------------- Wound Assessment Details Patient Name: Toops, Lawonda H. Date of Service: 07/18/2015 2:15 PM Medical Record Number: [id number] Patient Account Number: [id number] Date of Birth/Sex: 1945-10-28 (69 y.o. Female) Treating RN: [name] Primary Care Physician: [name] Other Clinician: Referring Physician: Paulita [name] Treating Physician/Extender: [name] in Treatment: 18 Wound Status Wound Number: 2 Primary Etiology: Pressure Ulcer Wound Location: Right Elbow Wound Status: Open Wounding Event: Gradually Appeared Date Acquired: 02/25/2015 Weeks Of Treatment: 18 Clustered Wound: No Photos Photo Uploaded By: Mckenzie Cool, RN, BSN, [name] on 07/18/2015 16:52:16 Wound Measurements Length: (cm) 0.1 Width: (cm) 0.1 Depth: (cm) 0.2 Area: (cm) 0.008 Volume: (cm) 0.002 % Reduction in Area: 99.6% % Reduction in Volume: 99.5% Wound Description Classification: Category/Stage III Periwound Skin  Texture Texture Color No Abnormalities Noted: No No Abnormalities Noted: No Moisture No Abnormalities Noted: No Treatment Notes Wound #2 (Right Elbow) 1. Cleansed with: Cater, Zaniah H. ([id number]) Clean wound with Normal Saline 2. Anesthetic Topical Lidocaine 4% cream to wound bed prior to debridement 3. Peri-wound Care: Barrier cream 4. Dressing Applied: Aquacel Ag Santyl Ointment 5. Secondary Dressing Applied Bordered Foam Dressing Notes heel aquacel ag; elbow BFD only; santyl back and BFD Electronic Signature(s) Signed: 07/18/2015 4:42:28 PM By: Mckenzie Cool, RN,  BSN, Kim RN, BSN Entered By: Mckenzie Cool, RN, BSN, [name] on 07/18/2015 14:30:21 Prestia, Chance. ([id number]) -------------------------------------------------------------------------------- Wound Assessment Details Patient Name: Covell, Aubrianna H. Date of Service: 07/18/2015 2:15 PM Medical Record Number: [id number] Patient Account Number: [id number] Date of Birth/Sex: 1946-03-01 (69 y.o. Female) Treating RN: [name] Primary Care Physician: [name] Other Clinician: Referring Physician: Paulita [name] Treating Physician/Extender: [name] in Treatment: 18 Wound Status Wound Number: 3 Primary Etiology: Pressure Ulcer Wound Location: Midline Back Wound Status: Open Wounding Event: Pressure Injury Date Acquired: 05/02/2015 Weeks Of Treatment: 11 Clustered Wound: No Photos Photo Uploaded By: Mckenzie Cool, RN, BSN, [name] on 07/18/2015 16:52:16 Wound Measurements Length: (cm) 3.2 Width: (cm) 2.2 Depth: (cm) 0.1 Area: (cm) 5.529 Volume: (cm) 0.553 % Reduction in Area: -2413.2% % Reduction in Volume: -2413.6% Wound Description Classification: Category/Stage II Periwound Skin Texture Texture Color No Abnormalities Noted: No No Abnormalities Noted: No Moisture No Abnormalities Noted: No Treatment Notes Wound #3 (Midline Back) 1. Cleansed with: Shetley, Charles H. ([id number]) Clean wound with Normal  Saline 2. Anesthetic Topical Lidocaine 4% cream to wound bed prior to debridement 3. Peri-wound Care: Barrier cream 4. Dressing Applied: Aquacel Ag Santyl Ointment 5. Secondary Dressing Applied Bordered Foam Dressing Notes heel aquacel ag; elbow BFD only; santyl back and BFD Electronic Signature(s) Signed: 07/18/2015 4:42:28 PM By: Mckenzie Cool, RN, BSN, Kim RN, BSN Entered By: Mckenzie Cool, RN, BSN, [name] on 07/18/2015 14:30:21 Arizmendi, Durand. ([id number]) -------------------------------------------------------------------------------- Wound Assessment Details Patient Name: Auth, Annalyse H. Date of Service: 07/18/2015 2:15 PM Medical Record Number: [id number] Patient Account Number: [id number] Date of Birth/Sex: 13-Mar-1946 (69 y.o. Female) Treating RN: [name] Primary Care Physician: [name] Other Clinician: Referring Physician: Paulita [name] Treating Physician/Extender: [name] in Treatment: 18 Wound Status Wound Number: 4 Primary Etiology: Pressure Ulcer Wound Location: Right Calcaneous Wound Status: Open Wounding Event: Pressure Injury Date Acquired: 05/11/2015 Weeks Of Treatment: 9 Clustered Wound: No Photos Photo Uploaded By: Mckenzie Cool, RN, BSN, [name] on 07/18/2015 16:52:28 Wound Measurements Length: (cm) 0.4 Width: (cm) 0.5 Depth: (cm) 0.1 Area: (cm) 0.157 Volume: (cm) 0.016 % Reduction in Area: 95.7% % Reduction in Volume: 95.6% Wound Description Classification: Category/Stage III Periwound Skin Texture Texture Color No Abnormalities Noted: No No Abnormalities Noted: No Moisture No Abnormalities Noted: No Treatment Notes Wound #4 (Right Calcaneous) 1. Cleansed with: Thoman, Rethel H. ([id number]) Clean wound with Normal Saline 2. Anesthetic Topical Lidocaine 4% cream to wound bed prior to debridement 3. Peri-wound Care: Barrier cream 4. Dressing Applied: Aquacel Ag Santyl Ointment 5. Secondary Dressing Applied Bordered Foam  Dressing Notes heel aquacel ag; elbow BFD only; santyl back and BFD Electronic Signature(s) Signed: 07/18/2015 4:42:28 PM By: Mckenzie Cool, RN, BSN, Kim RN, BSN Entered By: Mckenzie Cool, RN, BSN, [name] on 07/18/2015 14:30:21 Nan, Jehieli H. ([id number]) -------------------------------------------------------------------------------- Vitals Details Patient Name: [name], Jumanah H. Date of Service: 07/18/2015 2:15 PM Medical Record Number: [id number] Patient Account Number: [id number] Date of Birth/Sex: Sep 21, 1945 (69 y.o. Female) Treating RN: [name] Primary Care Physician: [name] Other Clinician: Referring Physician: Paulita [name] Treating Physician/Extender: [name] in Treatment: 18 Vital Signs Time Taken: 14:20 Temperature (F): 98.0 Height (in): 65 Pulse (bpm): 87 Weight (lbs): 118 Respiratory Rate (breaths/min): 18 Body Mass Index (BMI): 19.6 Blood Pressure (mmHg): 124/67 Reference Range: 80 - 120 mg / dl Electronic Signature(s) Signed: 07/18/2015 4:42:28 PM By: Mckenzie Cool, RN, BSN, Kim RN, BSN Entered By: Mckenzie Cool, RN, BSN, [name] on 07/18/2015 14:21:19

## 2015-07-19 NOTE — Progress Notes (Signed)
[name], [name] ([id number]) Visit Report for 07/18/2015 Chief Complaint Document Details Patient Name: [name], [name] Date of Service: 07/18/2015 2:15 PM Medical Record Number: [id number] Patient Account Number: [id number] Date of Birth/Sex: 1946/05/07 (69 y.o. Female) Treating RN: Cornell Barman Primary Care Physician: Paulita Cradle Other Clinician: Referring Physician: Paulita Cradle Treating Physician/Extender: Frann Rider in Treatment: 18 Information Obtained from: Patient Chief Complaint Patient presents to the wound care center for a consult due non healing wound. Ulcers on the right elbow and the right heel for about 1 month. Electronic Signature(s) Signed: 07/18/2015 2:52:19 PM By: Christin Fudge MD, FACS Entered By: Christin Fudge on 07/18/2015 14:52:18 [name], [name] ([id number]) -------------------------------------------------------------------------------- Debridement Details Patient Name: [name], [name] Date of Service: 07/18/2015 2:15 PM Medical Record Number: [id number] Patient Account Number: [id number] Date of Birth/Sex: 1946-03-25 (69 y.o. Female) Treating RN: Cornell Barman Primary Care Physician: Paulita Cradle Other Clinician: Referring Physician: Paulita Cradle Treating Physician/Extender: Frann Rider in Treatment: 18 Debridement Performed for Wound #3 Midline Back Assessment: Performed By: Physician Christin Fudge, MD Debridement: Debridement Pre-procedure Yes Verification/Time Out Taken: Start Time: 14:20 Level: Skin/Subcutaneous Tissue Total Area Debrided (L x 3.2 (cm) x 2.2 (cm) = 7.04 (cm) W): Tissue and other Viable, Exudate, Fibrin/Slough, Subcutaneous material debrided: Instrument: Forceps, Scissors Bleeding: Minimum Hemostasis Achieved: Pressure End Time: 14:25 Procedural Pain: 0 Post Procedural Pain: 0 Response to Treatment: Procedure was tolerated well Post Debridement Measurements of Total Wound Length: (cm)  3.2 Stage: Category/Stage II Width: (cm) 2.2 Depth: (cm) 0.2 Volume: (cm) 1.106 Post Procedure Diagnosis Same as Pre-procedure Electronic Signature(s) Signed: 07/18/2015 2:51:35 PM By: Christin Fudge MD, FACS Signed: 07/18/2015 4:42:28 PM By: Gretta Cool RN, BSN, Kim RN, BSN Entered By: Christin Fudge on 07/18/2015 14:51:35 [name], [name] ([id number]) -------------------------------------------------------------------------------- HPI Details Patient Name: [name], [name] Date of Service: 07/18/2015 2:15 PM Medical Record Number: [id number] Patient Account Number: [id number] Date of Birth/Sex: Aug 07, 1946 (69 y.o. Female) Treating RN: Cornell Barman Primary Care Physician: Paulita Cradle Other Clinician: Referring Physician: Paulita Cradle Treating Physician/Extender: Frann Rider in Treatment: 18 History of Present Illness Location: Ulceration on the right heel and the right elbow. Quality: Patient reports experiencing a dull pain to affected area(s). Severity: Patient states wound (s) are getting better. Duration: Patient has had the wound for < 4 weeks prior to presenting for treatment Timing: Pain in wound is Intermittent (comes and goes Context: The wound appeared gradually over time Modifying Factors: Consults to this date include:Augmentin and Bactrim and also some heel protection with duoderm Associated Signs and Symptoms: Patient reports having difficulty standing for long periods. HPI Description: 69 year old female with history of peripheral neuropathy, history of diet controlled diabetes mellitus type 2, history of alcoholism here for wound consult sent by her PCP Dr. Sherilyn Cooter. She has pressure ulcers at her right elbow, bilateral heels. Plain films of right calcaneus without acute bony process. Patient started by PCP on Augmentin, Bactrim as per orders, DuoDerm dressings applied - reports some improvement in her ulcer since last seen. Denies fever, chills, nausea,  vomiting, diarrhea. She had a right humerus fracture in the middle of May and has had no surgery and arm is in a sling. She is also been laying in the bed for quite a while. Past medical history significant for essential hypertension, osteoporosis, peripheral neuropathy, alcoholism, ataxia, personal history of breast cancer treated with surgery chemotherapy and radiation and this was done in December 2010. she is also status post laparoscopic cholecystectomy, pilonidal cyst excision,  subcutaneous port placement, partial mastectomy on the left side, skin cancer removal. 03/21/2015 -- she says overall she's been doing better and continues to smoke about 15 cigarettes a day. 03/21/2015 - her orthopedic doctor has said she may require surgery for her right humerus fracture. 04/04/2015 -- her orthopedic surgery has been scheduled for August 11. 04/18/2015 -- she is doing fine as far as her elbow and her right heel goes but she has developed some redness over prominence on her thoracic spine and wanted me to take a look at this. 05/02/2015 -- she had her surgery done and now is in a sling and support. Her back has developed a pressure injury of undetermined stage. She seems to be in better spirits. 05/16/2015 -- last week her right heel was looking great and we had healed it out, but she has not been offloading appropriately and has a deep tissue injury on the right heel again. The area on her right elbow has opened out with slough and the area in the thoracic spine is also getting worse. 05/30/2015 -- she has developed 2 new ulcerations one on her left ischial tuberosity and one on the sacral region. She is still working on getting up smoking but is also unable to take her vitamins as she says she develops a diarrhea when she takes vitamins. She has increased her intake of proteins. 06/06/2015 -- the patient's husband manages to get her a low air loss mattress with initiating pressure but [name],  [name] ([id number]) did not know how to use it exactly and the patient was not happy about using it. Overall she says she's been feeling better. 07/11/2015 -- . Discussed a surgical opinion for debridement and application of a wound VAC, 2 weeks ago but the patient has been reluctant to get a surgical opinion as she wants to avoid surgery. 07/18/2015 -- they have an appointment to see Dr. Tamala Julian this coming Wednesday. Electronic Signature(s) Signed: 07/18/2015 2:52:43 PM By: Christin Fudge MD, FACS Entered By: Christin Fudge on 07/18/2015 14:52:43 [name], [name] ([id number]) -------------------------------------------------------------------------------- Physical Exam Details Patient Name: [name], [name] Date of Service: 07/18/2015 2:15 PM Medical Record Number: [id number] Patient Account Number: [id number] Date of Birth/Sex: 1946/03/11 (69 y.o. Female) Treating RN: Cornell Barman Primary Care Physician: Paulita Cradle Other Clinician: Referring Physician: Paulita Cradle Treating Physician/Extender: Frann Rider in Treatment: 18 Constitutional . Pulse regular. Respirations normal and unlabored. Afebrile. . Eyes Nonicteric. Reactive to light. Ears, Nose, Mouth, and Throat Lips, teeth, and gums WNL.Marland Kitchen Moist mucosa without lesions . Neck supple and nontender. No palpable supraclavicular or cervical adenopathy. Normal sized without goiter. Respiratory WNL. No retractions.. Cardiovascular Pedal Pulses WNL. No clubbing, cyanosis or edema. Lymphatic No adneopathy. No adenopathy. No adenopathy. Musculoskeletal Adexa without tenderness or enlargement.. Digits and nails w/o clubbing, cyanosis, infection, petechiae, ischemia, or inflammatory conditions.. Integumentary (Hair, Skin) No suspicious lesions. No crepitus or fluctuance. No peri-wound warmth or erythema. No masses.Marland Kitchen Psychiatric Judgement and insight Intact.. No evidence of depression, anxiety, or agitation.. Notes The  right heel looks clean and has healthy granulation tissue.The left elbow is almost closed and has no area to pack. the thoracic spine area has separated a bit and I will try to sharply debride as much as I can with forcep and scissors. Electronic Signature(s) Signed: 07/18/2015 2:53:58 PM By: Christin Fudge MD, FACS Entered By: Christin Fudge on 07/18/2015 14:53:57 [name], [name] ([id number]) -------------------------------------------------------------------------------- Physician Orders Details Patient Name: [name], Elky H. Date of Service: 07/18/2015 2:15  PM Medical Record Number: [id number] Patient Account Number: [id number] Date of Birth/Sex: 11/17/1945 (69 y.o. Female) Treating RN: Cornell Barman Primary Care Physician: Paulita Cradle Other Clinician: Referring Physician: Paulita Cradle Treating Physician/Extender: Frann Rider in Treatment: 57 Verbal / Phone Orders: Yes Clinician: Cornell Barman Read Back and Verified: Yes Diagnosis Coding Wound Cleansing Wound #2 Right Elbow o Clean wound with Normal Saline. o May Shower, gently pat wound dry prior to applying new dressing. o No tub bath. Wound #3 Midline Back o Clean wound with Normal Saline. o May Shower, gently pat wound dry prior to applying new dressing. o No tub bath. Wound #4 Right Calcaneous o Clean wound with Normal Saline. o May Shower, gently pat wound dry prior to applying new dressing. o No tub bath. Skin Barriers/Peri-Wound Care Wound #3 Midline Back o Barrier cream Primary Wound Dressing Wound #4 Right Calcaneous o Aquacel Ag Wound #3 Midline Back o Santyl Ointment Wound #2 Right Elbow o Other: - bordered foam Secondary Dressing Wound #2 Right Elbow o Boardered Foam Dressing Wound #3 Midline Back o Boardered Foam Dressing [name], [name] ([id number]) Dressing Change Frequency Wound #2 Right Elbow o Change dressing every other day. Wound #4 Right  Calcaneous o Change dressing every other day. Wound #3 Midline Back o Change dressing every day. Follow-up Appointments Wound #2 Right Elbow o Return Appointment in 1 week. Wound #3 Midline Back o Return Appointment in 1 week. Wound #4 Right Calcaneous o Return Appointment in 1 week. Off-Loading Wound #2 Right Elbow o Heel suspension boot to: - Sage boots heels o Turn and reposition every 2 hours o Mattress - patient refuses use of air mattress Wound #3 Midline Back o Heel suspension boot to: - Sage boots heels o Turn and reposition every 2 hours o Mattress - patient refuses use of air mattress Wound #4 Right Calcaneous o Heel suspension boot to: - Sage boots heels o Turn and reposition every 2 hours o Mattress - patient refuses use of air mattress Home Health Wound #2 Right Elbow o [place] Nurse may visit PRN to address patientos wound care needs. o FACE TO FACE ENCOUNTER: MEDICARE and MEDICAID PATIENTS: I certify that this patient is under my care and that I had a face-to-face encounter that meets the physician face-to-face encounter requirements with this patient on this date. The encounter with the patient was in whole or in part for the following MEDICAL CONDITION: (primary reason for [place]) MEDICAL NECESSITY: I certify, that based on my findings, NURSING services are a medically necessary home health service. HOME BOUND STATUS: I certify that my clinical findings support that this patient is homebound (i.e., Due to illness or injury, pt requires aid of supportive devices such as crutches, cane, wheelchairs, walkers, the use of special [place], Nocole H. ([id number]) transportation or the assistance of another person to leave their place of residence. There is a normal inability to leave the home and doing so requires considerable and taxing effort. Other absences are for medical reasons / religious  services and are infrequent or of short duration when for other reasons). o If current dressing causes regression in wound condition, may D/C ordered dressing product/s and apply Normal Saline Moist Dressing daily until next [place] / Other MD appointment. [place] of regression in wound condition at [phone]. o Please direct any NON-WOUND related issues/requests for orders to patient's Primary Care Physician Wound #3 Midline Back o Continue Home  Health Visits o [place] Nurse may visit PRN to address patientos wound care needs. o FACE TO FACE ENCOUNTER: MEDICARE and MEDICAID PATIENTS: I certify that this patient is under my care and that I had a face-to-face encounter that meets the physician face-to-face encounter requirements with this patient on this date. The encounter with the patient was in whole or in part for the following MEDICAL CONDITION: (primary reason for [place]) MEDICAL NECESSITY: I certify, that based on my findings, NURSING services are a medically necessary home health service. HOME BOUND STATUS: I certify that my clinical findings support that this patient is homebound (i.e., Due to illness or injury, pt requires aid of supportive devices such as crutches, cane, wheelchairs, walkers, the use of special transportation or the assistance of another person to leave their place of residence. There is a normal inability to leave the home and doing so requires considerable and taxing effort. Other absences are for medical reasons / religious services and are infrequent or of short duration when for other reasons). o If current dressing causes regression in wound condition, may D/C ordered dressing product/s and apply Normal Saline Moist Dressing daily until next [place] / Other MD appointment. [place] of regression in wound condition at [phone]. o Please direct any NON-WOUND  related issues/requests for orders to patient's Primary Care Physician Wound #4 Right [place] Nurse may visit PRN to address patientos wound care needs. o FACE TO FACE ENCOUNTER: MEDICARE and MEDICAID PATIENTS: I certify that this patient is under my care and that I had a face-to-face encounter that meets the physician face-to-face encounter requirements with this patient on this date. The encounter with the patient was in whole or in part for the following MEDICAL CONDITION: (primary reason for [place]) MEDICAL NECESSITY: I certify, that based on my findings, NURSING services are a medically necessary home health service. HOME BOUND STATUS: I certify that my clinical findings support that this patient is homebound (i.e., Due to illness or injury, pt requires aid of supportive devices such as crutches, cane, wheelchairs, walkers, the use of special transportation or the assistance of another person to leave their place of residence. There is a normal inability to leave the home and doing so requires considerable and taxing effort. Other absences are for medical reasons / religious services and are infrequent or of short duration when for other reasons). o If current dressing causes regression in wound condition, may D/C ordered dressing product/s and apply Normal Saline Moist Dressing daily until next [place] / Other MD appointment. [place] of regression in wound condition at [phone]. Kass, [name] ([id number]) o Please direct any NON-WOUND related issues/requests for orders to patient's Primary Care Physician Electronic Signature(s) Signed: 07/18/2015 3:45:45 PM By: Christin Fudge MD, FACS Signed: 07/18/2015 4:42:28 PM By: Gretta Cool RN, BSN, Kim RN, BSN Entered By: Gretta Cool, RN, BSN, Kim on 07/18/2015 14:44:46 [place], Jozee H.  ([id number]) -------------------------------------------------------------------------------- Problem List Details Patient Name: Wendel, Amena H. Date of Service: 07/18/2015 2:15 PM Medical Record Number: [id number] Patient Account Number: [id number] Date of Birth/Sex: May 16, 1946 (69 y.o. Female) Treating RN: Cornell Barman Primary Care Physician: Paulita Cradle Other Clinician: Referring Physician: Paulita Cradle Treating Physician/Extender: Frann Rider in Treatment: 101 Active Problems ICD-10 Encounter Code Description Active Date Diagnosis E11.621 Type 2 diabetes mellitus with foot ulcer 03/13/2015 Yes L89.613 Pressure ulcer of right heel, stage 3 03/13/2015 Yes L89.013 Pressure  ulcer of right elbow, stage 3 03/13/2015 Yes F17.218 Nicotine dependence, cigarettes, with other nicotine- 03/13/2015 Yes induced disorders L89.100 Pressure ulcer of unspecified part of back, unstageable 05/02/2015 Yes L89.222 Pressure ulcer of left hip, stage 2 05/30/2015 Yes L89.153 Pressure ulcer of sacral region, stage 3 05/30/2015 Yes Inactive Problems Resolved Problems Electronic Signature(s) Signed: 07/18/2015 2:51:17 PM By: Christin Fudge MD, FACS Entered By: Christin Fudge on 07/18/2015 14:51:17 [place], Mindi H. ([id number]) -------------------------------------------------------------------------------- Progress Note Details Patient Name: Twilley, Lyanna H. Date of Service: 07/18/2015 2:15 PM Medical Record Number: [id number] Patient Account Number: [id number] Date of Birth/Sex: 01-Jan-1946 (69 y.o. Female) Treating RN: Cornell Barman Primary Care Physician: Paulita Cradle Other Clinician: Referring Physician: Paulita Cradle Treating Physician/Extender: Frann Rider in Treatment: 18 Subjective Chief Complaint Information obtained from Patient Patient presents to the wound care center for a consult due non healing wound. Ulcers on the right elbow and the right heel for about 1  month. History of Present Illness (HPI) The following HPI elements were documented for the patient's wound: Location: Ulceration on the right heel and the right elbow. Quality: Patient reports experiencing a dull pain to affected area(s). Severity: Patient states wound (s) are getting better. Duration: Patient has had the wound for < 4 weeks prior to presenting for treatment Timing: Pain in wound is Intermittent (comes and goes Context: The wound appeared gradually over time Modifying Factors: Consults to this date include:Augmentin and Bactrim and also some heel protection with duoderm Associated Signs and Symptoms: Patient reports having difficulty standing for long periods. 69 year old female with history of peripheral neuropathy, history of diet controlled diabetes mellitus type 2, history of alcoholism here for wound consult sent by her PCP Dr. Sherilyn Cooter. She has pressure ulcers at her right elbow, bilateral heels. Plain films of right calcaneus without acute bony process. Patient started by PCP on Augmentin, Bactrim as per orders, DuoDerm dressings applied - reports some improvement in her ulcer since last seen. Denies fever, chills, nausea, vomiting, diarrhea. She had a right humerus fracture in the middle of May and has had no surgery and arm is in a sling. She is also been laying in the bed for quite a while. Past medical history significant for essential hypertension, osteoporosis, peripheral neuropathy, alcoholism, ataxia, personal history of breast cancer treated with surgery chemotherapy and radiation and this was done in December 2010. she is also status post laparoscopic cholecystectomy, pilonidal cyst excision, subcutaneous port placement, partial mastectomy on the left side, skin cancer removal. 03/21/2015 -- she says overall she's been doing better and continues to smoke about 15 cigarettes a day. 03/21/2015 - her orthopedic doctor has said she may require surgery for her right  humerus fracture. 04/04/2015 -- her orthopedic surgery has been scheduled for August 11. 04/18/2015 -- she is doing fine as far as her elbow and her right heel goes but she has developed some redness over prominence on her thoracic spine and wanted me to take a look at this. [place], Ily H. ([id number]) 05/02/2015 -- she had her surgery done and now is in a sling and support. Her back has developed a pressure injury of undetermined stage. She seems to be in better spirits. 05/16/2015 -- last week her right heel was looking great and we had healed it out, but she has not been offloading appropriately and has a deep tissue injury on the right heel again. The area on her right elbow has opened out with slough and the area in the thoracic spine is  also getting worse. 05/30/2015 -- she has developed 2 new ulcerations one on her left ischial tuberosity and one on the sacral region. She is still working on getting up smoking but is also unable to take her vitamins as she says she develops a diarrhea when she takes vitamins. She has increased her intake of proteins. 06/06/2015 -- the patient's husband manages to get her a low air loss mattress with initiating pressure but did not know how to use it exactly and the patient was not happy about using it. Overall she says she's been feeling better. 07/11/2015 -- . Discussed a surgical opinion for debridement and application of a wound VAC, 2 weeks ago but the patient has been reluctant to get a surgical opinion as she wants to avoid surgery. 07/18/2015 -- they have an appointment to see Dr. Tamala Julian this coming Wednesday. Objective Constitutional Pulse regular. Respirations normal and unlabored. Afebrile. Vitals Time Taken: 2:20 PM, Height: 65 in, Weight: 118 lbs, BMI: 19.6, Temperature: 98.0 F, Pulse: 87 bpm, Respiratory Rate: 18 breaths/min, Blood Pressure: 124/67 mmHg. Eyes Nonicteric. Reactive to light. Ears, Nose, Mouth, and Throat Lips, teeth, and  gums WNL.Marland Kitchen Moist mucosa without lesions . Neck supple and nontender. No palpable supraclavicular or cervical adenopathy. Normal sized without goiter. Respiratory WNL. No retractions.. Cardiovascular Pedal Pulses WNL. No clubbing, cyanosis or edema. Lymphatic No adneopathy. No adenopathy. No adenopathy. Musculoskeletal Adexa without tenderness or enlargement.. Digits and nails w/o clubbing, cyanosis, infection, petechiae, ischemia, or inflammatory conditions.Marland Kitchen Soberanis, Jacquelina H. ([id number]) Psychiatric Judgement and insight Intact.. No evidence of depression, anxiety, or agitation.. General Notes: The right heel looks clean and has healthy granulation tissue.The left elbow is almost closed and has no area to pack. the thoracic spine area has separated a bit and I will try to sharply debride as much as I can with forcep and scissors. Integumentary (Hair, Skin) No suspicious lesions. No crepitus or fluctuance. No peri-wound warmth or erythema. No masses.. Wound #2 status is Open. Original cause of wound was Gradually Appeared. The wound is located on the Right Elbow. The wound measures 0.1cm length x 0.1cm width x 0.2cm depth; 0.008cm^2 area and 0.002cm^3 volume. Wound #3 status is Open. Original cause of wound was Pressure Injury. The wound is located on the Midline Back. The wound measures 3.2cm length x 2.2cm width x 0.1cm depth; 5.529cm^2 area and 0.553cm^3 volume. Wound #4 status is Open. Original cause of wound was Pressure Injury. The wound is located on the Right Calcaneous. The wound measures 0.4cm length x 0.5cm width x 0.1cm depth; 0.157cm^2 area and 0.016cm^3 volume. Assessment Active Problems ICD-10 E11.621 - Type 2 diabetes mellitus with foot ulcer L89.613 - Pressure ulcer of right heel, stage 3 L89.013 - Pressure ulcer of right elbow, stage 3 F17.218 - Nicotine dependence, cigarettes, with other nicotine-induced disorders L89.100 - Pressure ulcer of unspecified part of  back, unstageable B01.751 - Pressure ulcer of left hip, stage 2 L89.153 - Pressure ulcer of sacral region, stage 3 After sharply debriding the soft tissue in the region of the thoracic spine reveal continue to use Santyl ointment on a daily basis with an appropriate foam bandage. Silver alginate and a bordered foam to be applied to the right heel and on the right elbow we will just protect this with bordered form. Sarafian, Athira H. ([id number]) We will see her back next Friday. Procedures Wound #3 Wound #3 is a Pressure Ulcer located on the Midline Back . There was a Skin/Subcutaneous Tissue Debridement ([id number])  debridement with total area of 7.04 sq cm performed by Christin Fudge, MD. with the following instrument(s): Forceps and Scissors to remove Viable tissue/material including Exudate, Fibrin/Slough, and Subcutaneous. A time out was conducted prior to the start of the procedure. A Minimum amount of bleeding was controlled with Pressure. The procedure was tolerated well with a pain level of 0 throughout and a pain level of 0 following the procedure. Post Debridement Measurements: 3.2cm length x 2.2cm width x 0.2cm depth; 1.106cm^3 volume. Post debridement Stage noted as Category/Stage II. Post procedure Diagnosis Wound #3: Same as Pre-Procedure Plan Wound Cleansing: Wound #2 Right Elbow: Clean wound with Normal Saline. May Shower, gently pat wound dry prior to applying new dressing. No tub bath. Wound #3 Midline Back: Clean wound with Normal Saline. May Shower, gently pat wound dry prior to applying new dressing. No tub bath. Wound #4 Right Calcaneous: Clean wound with Normal Saline. May Shower, gently pat wound dry prior to applying new dressing. No tub bath. Skin Barriers/Peri-Wound Care: Wound #3 Midline Back: Barrier cream Primary Wound Dressing: Wound #4 Right Calcaneous: Aquacel Ag Wound #3 Midline Back: Santyl Ointment Wound #2 Right Elbow: Other: - bordered  foam Secondary Dressing: Wound #2 Right Elbow: Boardered Foam Dressing Wound #3 Midline Back: Boardered Foam Dressing [place], [place] H. ([id number]) Dressing Change Frequency: Wound #2 Right Elbow: Change dressing every other day. Wound #4 Right Calcaneous: Change dressing every other day. Wound #3 Midline Back: Change dressing every day. Follow-up Appointments: Wound #2 Right Elbow: Return Appointment in 1 week. Wound #3 Midline Back: Return Appointment in 1 week. Wound #4 Right Calcaneous: Return Appointment in 1 week. Off-Loading: Wound #2 Right Elbow: Heel suspension boot to: - Sage boots heels Turn and reposition every 2 hours Mattress - patient refuses use of air mattress Wound #3 Midline Back: Heel suspension boot to: - Sage boots heels Turn and reposition every 2 hours Mattress - patient refuses use of air mattress Wound #4 Right Calcaneous: Heel suspension boot to: - Sage boots heels Turn and reposition every 2 hours Mattress - patient refuses use of air mattress Home Health: Wound #2 Right Elbow: [place] Nurse may visit PRN to address patient s wound care needs. FACE TO FACE ENCOUNTER: MEDICARE and MEDICAID PATIENTS: I certify that this patient is under my care and that I had a face-to-face encounter that meets the physician face-to-face encounter requirements with this patient on this date. The encounter with the patient was in whole or in part for the following MEDICAL CONDITION: (primary reason for [place]) MEDICAL NECESSITY: I certify, that based on my findings, NURSING services are a medically necessary home health service. HOME BOUND STATUS: I certify that my clinical findings support that this patient is homebound (i.e., Due to illness or injury, pt requires aid of supportive devices such as crutches, cane, wheelchairs, walkers, the use of special transportation or the assistance of another person to leave their place  of residence. There is a normal inability to leave the home and doing so requires considerable and taxing effort. Other absences are for medical reasons / religious services and are infrequent or of short duration when for other reasons). If current dressing causes regression in wound condition, may D/C ordered dressing product/s and apply Normal Saline Moist Dressing daily until next [place] / Other MD appointment. [place] of regression in wound condition at [phone]. Please direct any NON-WOUND related issues/requests for orders to patient's Primary Care  Physician Wound #3 Midline Back: [place] Nurse may visit PRN to address patient s wound care needs. FACE TO FACE ENCOUNTER: MEDICARE and MEDICAID PATIENTS: I certify that this patient is under my care and that I had a face-to-face encounter that meets the physician face-to-face encounter requirements with this patient on this date. The encounter with the patient was in whole or in part for the [facility] [place]. ([id number]) following MEDICAL CONDITION: (primary reason for [place]) MEDICAL NECESSITY: I certify, that based on my findings, NURSING services are a medically necessary home health service. HOME BOUND STATUS: I certify that my clinical findings support that this patient is homebound (i.e., Due to illness or injury, pt requires aid of supportive devices such as crutches, cane, wheelchairs, walkers, the use of special transportation or the assistance of another person to leave their place of residence. There is a normal inability to leave the home and doing so requires considerable and taxing effort. Other absences are for medical reasons / religious services and are infrequent or of short duration when for other reasons). If current dressing causes regression in wound condition, may D/C ordered dressing product/s and apply Normal Saline Moist Dressing daily  until next [place] / Other MD appointment. [place] of regression in wound condition at [phone]. Please direct any NON-WOUND related issues/requests for orders to patient's Primary Care Physician Wound #4 Right Calcaneous: [place] Nurse may visit PRN to address patient s wound care needs. FACE TO FACE ENCOUNTER: MEDICARE and MEDICAID PATIENTS: I certify that this patient is under my care and that I had a face-to-face encounter that meets the physician face-to-face encounter requirements with this patient on this date. The encounter with the patient was in whole or in part for the following MEDICAL CONDITION: (primary reason for [place]) MEDICAL NECESSITY: I certify, that based on my findings, NURSING services are a medically necessary home health service. HOME BOUND STATUS: I certify that my clinical findings support that this patient is homebound (i.e., Due to illness or injury, pt requires aid of supportive devices such as crutches, cane, wheelchairs, walkers, the use of special transportation or the assistance of another person to leave their place of residence. There is a normal inability to leave the home and doing so requires considerable and taxing effort. Other absences are for medical reasons / religious services and are infrequent or of short duration when for other reasons). If current dressing causes regression in wound condition, may D/C ordered dressing product/s and apply Normal Saline Moist Dressing daily until next [place] / Other MD appointment. [place] of regression in wound condition at [phone]. Please direct any NON-WOUND related issues/requests for orders to patient's Primary Care Physician After sharply debriding the soft tissue in the region of the thoracic spine reveal continue to use Santyl ointment on a daily basis with an appropriate foam bandage. Silver  alginate and a bordered foam to be applied to the right heel and on the right elbow we will just protect this with bordered form. We will see her back next Friday. Electronic Signature(s) Signed: 07/18/2015 2:55:58 PM By: Christin Fudge MD, FACS Entered By: Christin Fudge on 07/18/2015 14:55:57 Silvers, Rosalba H. ([id number]) -------------------------------------------------------------------------------- SuperBill Details Patient Name: [name], Ekta H. Date of Service: 07/18/2015 Medical Record Number: [id number] Patient Account Number: [id number] Date of Birth/Sex: 23-May-1946 (69 y.o. 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, stage 2 L89.153 Pressure ulcer of sacral region, stage 3 Facility Procedures CPT4 Code: [id number] Description: 06004 - DEB SUBQ TISSUE 20 SQ CM/< ICD-10 Description Diagnosis 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 ([id number]) Entered By: Christin Fudge on 07/18/2015 14:56:17

## 2015-07-25 ENCOUNTER — Ambulatory Visit: Payer: Medicare Other | Admitting: Surgery

## 2015-07-29 ENCOUNTER — Encounter: Payer: Medicare Other | Admitting: Surgery

## 2015-07-29 DIAGNOSIS — E11621 Type 2 diabetes mellitus with foot ulcer: Secondary | ICD-10-CM | POA: Diagnosis not present

## 2015-07-30 NOTE — Progress Notes (Signed)
HURLOCK, Lauretta H. ([id number]) Visit Report for 07/29/2015 Arrival Information Details Patient Name: LACSON, Carrisa H. Date of Service: 07/29/2015 3:30 PM Medical Record Number: [id number] Patient Account Number: [id number] Date of Birth/Sex: [birth year] (69 y.o. Female) Treating RN: Cornell Barman Primary Care Physician: Paulita Cradle Other Clinician: Referring Physician: Paulita Cradle Treating Physician/Extender: Frann Rider in Treatment: 33 Visit Information History Since Last Visit Added or deleted any medications: No Patient Arrived: Walker Any new allergies or adverse reactions: No Arrival Time: 15:44 Had a fall or experienced change in No Accompanied By: husband activities of daily living that may affect Transfer Assistance: Manual risk of falls: Patient Identification Verified: Yes Signs or symptoms of abuse/neglect since last No Secondary Verification Process Yes visito Completed: Hospitalized since last visit: No Patient Requires Transmission-Based No Has Dressing in Place as Prescribed: Yes Precautions: Pain Present Now: No Patient Has Alerts: No Electronic Signature(s) Signed: 07/29/2015 5:36:16 PM By: Gretta Cool, RN, BSN, Kim RN, BSN Entered By: Gretta Cool, RN, BSN, Kim on 07/29/2015 15:44:30 Mehan, Zakhia H. ([id number]) -------------------------------------------------------------------------------- Clinic Level of Care Assessment Details Patient Name: Mccahill, Shara H. Date of Service: 07/29/2015 3:30 PM Medical Record Number: [id number] Patient Account Number: [id number] Date of Birth/Sex: [birth year] (69 y.o. Female) Treating RN: Cornell Barman Primary Care Physician: Paulita Cradle Other Clinician: Referring Physician: Paulita Cradle Treating Physician/Extender: Frann Rider in Treatment: 19 Clinic Level of Care Assessment Items TOOL 4 Quantity Score []  - Use when only an EandM is performed on FOLLOW-UP visit 0 ASSESSMENTS - Nursing Assessment /  Reassessment []  - Reassessment of Co-morbidities (includes updates in patient status) 0 X - Reassessment of Adherence to Treatment Plan 1 5 ASSESSMENTS - Wound and Skin Assessment / Reassessment []  - Simple Wound Assessment / Reassessment - one wound 0 X - Complex Wound Assessment / Reassessment - multiple wounds 3 5 []  - Dermatologic / Skin Assessment (not related to wound area) 0 ASSESSMENTS - Focused Assessment []  - Circumferential Edema Measurements - multi extremities 0 []  - Nutritional Assessment / Counseling / Intervention 0 []  - Lower Extremity Assessment (monofilament, tuning fork, pulses) 0 []  - Peripheral Arterial Disease Assessment (using hand held doppler) 0 ASSESSMENTS - Ostomy and/or Continence Assessment and Care []  - Incontinence Assessment and Management 0 []  - Ostomy Care Assessment and Management (repouching, etc.) 0 PROCESS - Coordination of Care []  - Simple Patient / Family Education for ongoing care 0 X - Complex (extensive) Patient / Family Education for ongoing care 1 20 X - Staff obtains Programmer, systems, Records, Test Results / Process Orders 1 10 []  - Staff telephones HHA, Nursing Homes / Clarify orders / etc 0 []  - Routine Transfer to another Facility (non-emergent condition) 0 Hulsebus, Iyari H. ([id number]) []  - Routine Hospital Admission (non-emergent condition) 0 []  - New Admissions / Biomedical engineer / Ordering NPWT, Apligraf, etc. 0 []  - Emergency Hospital Admission (emergent condition) 0 X - Simple Discharge Coordination 1 10 []  - Complex (extensive) Discharge Coordination 0 PROCESS - Special Needs []  - Pediatric / Minor Patient Management 0 []  - Isolation Patient Management 0 []  - Hearing / Language / Visual special needs 0 []  - Assessment of Community assistance (transportation, D/C planning, etc.) 0 []  - Additional assistance / Altered mentation 0 []  - Support Surface(s) Assessment (bed, cushion, seat, etc.) 0 INTERVENTIONS - Wound Cleansing /  Measurement []  - Simple Wound Cleansing - one wound 0 X - Complex Wound Cleansing - multiple wounds 3 5 X - Wound Imaging (photographs - any number of  wounds) 1 5 []  - Wound Tracing (instead of photographs) 0 []  - Simple Wound Measurement - one wound 0 X - Complex Wound Measurement - multiple wounds 3 5 INTERVENTIONS - Wound Dressings []  - Small Wound Dressing one or multiple wounds 0 X - Medium Wound Dressing one or multiple wounds 3 15 []  - Large Wound Dressing one or multiple wounds 0 []  - Application of Medications - topical 0 []  - Application of Medications - injection 0 INTERVENTIONS - Miscellaneous []  - External ear exam 0 Zettlemoyer, Kleo H. ([id number]) []  - Specimen Collection (cultures, biopsies, blood, body fluids, etc.) 0 []  - Specimen(s) / Culture(s) sent or taken to Lab for analysis 0 []  - Patient Transfer (multiple staff / Civil Service fast streamer / Similar devices) 0 []  - Simple Staple / Suture removal (25 or less) 0 []  - Complex Staple / Suture removal (26 or more) 0 []  - Hypo / Hyperglycemic Management (close monitor of Blood Glucose) 0 []  - Ankle / Brachial Index (ABI) - do not check if billed separately 0 X - Vital Signs 1 5 Has the patient been seen at the hospital within the last three years: Yes Total Score: 145 Level Of Care: New/Established - Level 4 Electronic Signature(s) Signed: 07/29/2015 5:36:16 PM By: Gretta Cool, RN, BSN, Kim RN, BSN Entered By: Gretta Cool, RN, BSN, Kim on 07/29/2015 16:20:20 Gulas, Macee H. ([id number]) -------------------------------------------------------------------------------- Encounter Discharge Information Details Patient Name: [name], Evoleht H. Date of Service: 07/29/2015 3:30 PM Medical Record Number: [id number] Patient Account Number: [id number] Date of Birth/Sex: [birth year] (69 y.o. Female) Treating RN: Cornell Barman Primary Care Physician: Paulita Cradle Other Clinician: Referring Physician: Paulita Cradle Treating Physician/Extender: Frann Rider in Treatment: 40 Encounter Discharge Information Items Discharge Pain Level: 0 Discharge Condition: Stable Ambulatory Status: Walker Discharge Destination: Home Transportation: Private Auto Accompanied By: self Schedule Follow-up Appointment: Yes Medication Reconciliation completed and provided to Patient/Care Yes Johntavious Francom: Provided on Clinical Summary of Care: 07/29/2015 Form Type Recipient Paper Patient AS Electronic Signature(s) Signed: 07/29/2015 4:29:17 PM By: Ruthine Dose Entered By: Ruthine Dose on 07/29/2015 16:29:17 Kittel, Natalina H. ([id number]) -------------------------------------------------------------------------------- Lower Extremity Assessment Details Patient Name: Tudisco, Mark H. Date of Service: 07/29/2015 3:30 PM Medical Record Number: [id number] Patient Account Number: [id number] Date of Birth/Sex: July 18, 1946 (69 y.o. Female) Treating RN: Cornell Barman Primary Care Physician: Paulita Cradle Other Clinician: Referring Physician: Paulita Cradle Treating Physician/Extender: Frann Rider in Treatment: 19 Vascular Assessment Pulses: Posterior Tibial Dorsalis Pedis Palpable: [Right:Yes] Extremity colors, hair growth, and conditions: Extremity Color: [Right:Normal] Hair Growth on Extremity: [Right:Yes] Temperature of Extremity: [Right:Cool] Capillary Refill: [Right:< 3 seconds] Toe Nail Assessment Left: Right: Thick: No Discolored: Yes Deformed: No Improper Length and Hygiene: No Electronic Signature(s) Signed: 07/29/2015 5:36:16 PM By: Gretta Cool, RN, BSN, Kim RN, BSN Entered By: Gretta Cool, RN, BSN, Kim on 07/29/2015 15:48:44 Authier, Dorisann H. ([id number]) -------------------------------------------------------------------------------- Multi Wound Chart Details Patient Name: [name], Zeynab H. Date of Service: 07/29/2015 3:30 PM Medical Record Number: [id number] Patient Account Number: [id number] Date of Birth/Sex: Oct 03, 1945 (69 y.o.  Female) Treating RN: Cornell Barman Primary Care Physician: Paulita Cradle Other Clinician: Referring Physician: Paulita Cradle Treating Physician/Extender: Frann Rider in Treatment: 19 Vital Signs Height(in): 65 Pulse(bpm): 106 Weight(lbs): 118 Blood Pressure 123/105 (mmHg): Body Mass Index(BMI): 20 Temperature(F): 98.2 Respiratory Rate 18 (breaths/min): Photos: [2:No Photos] [3:No Photos] [4:No Photos] Wound Location: [2:Right Elbow] [3:Midline Back] [4:Right Calcaneous] Wounding Event: [2:Gradually Appeared] [3:Pressure Injury] [4:Pressure Injury] Primary Etiology: [2:Pressure Ulcer] [3:Pressure Ulcer] [4:Pressure Ulcer] Date Acquired: [2:02/25/2015] [3:05/02/2015] [  4:05/11/2015] Weeks of Treatment: [2:19] [3:12] [4:10] Wound Status: [2:Open] [3:Open] [4:Open] Measurements L x W x D 0x0x0 [3:1.2x1.5x0.2] [4:0.4x0.5x0.1] (cm) Area (cm) : [2:0] [3:1.414] [4:0.157] Volume (cm) : [2:0] [3:0.283] [4:0.016] % Reduction in Area: [2:100.00%] [3:-542.70%] [4:95.70%] % Reduction in Volume: 100.00% [3:-1186.40%] [4:95.60%] Classification: [2:Category/Stage III] [3:Category/Stage II] [4:Category/Stage III] Debridement: [2:N/A] [3:N/A] [4:Open Wound/Selective [id number]) - Selective] Time-Out Taken: [2:N/A] [3:N/A] [4:Yes] Pain Control: [2:N/A] [3:N/A] [4:Other] Tissue Debrided: [2:N/A] [3:N/A] [4:Necrotic/Eschar, Fibrin/Slough, Subcutaneous] Level: [2:N/A] [3:N/A] [4:Non-Viable Tissue] Debridement Area (sq [2:N/A] [3:N/A] [4:0.2] cm): Instrument: [2:N/A] [3:N/A] [4:Forceps, Scissors] Bleeding: [2:N/A] [3:N/A] [4:Minimum] Hemostasis Achieved: [2:N/A] [3:N/A] [4:Pressure] Procedural Pain: [2:N/A] [3:N/A] [4:0] Post Procedural Pain: [2:N/A] [3:N/A] [4:0] Debridement Treatment N/A N/A Procedure was tolerated Response: well Post Debridement N/A N/A 0.4x0.5x0.3 Measurements L x W x D (cm) Post Debridement N/A N/A 0.047 Volume: (cm) Post Debridement N/A N/A  Category/Stage III Stage: Periwound Skin Texture: No Abnormalities Noted No Abnormalities Noted No Abnormalities Noted Periwound Skin No Abnormalities Noted No Abnormalities Noted No Abnormalities Noted Moisture: Periwound Skin Color: No Abnormalities Noted No Abnormalities Noted No Abnormalities Noted Tenderness on No No No Palpation: Procedures Performed: N/A N/A Debridement Treatment Notes Electronic Signature(s) Signed: 07/29/2015 5:36:16 PM By: Gretta Cool, RN, BSN, Kim RN, BSN Entered By: Gretta Cool, RN, BSN, Kim on 07/29/2015 16:18:03 [place], Zilpha H. ([id number]) -------------------------------------------------------------------------------- [place] Details Patient Name: [name], Averey H. Date of Service: 07/29/2015 3:30 PM Medical Record Number: [id number] Patient Account Number: [id number] Date of Birth/Sex: 16-Oct-1945 (69 y.o. Female) Treating RN: Cornell Barman Primary Care Physician: Paulita Cradle Other Clinician: Referring Physician: Paulita Cradle Treating Physician/Extender: Frann Rider in Treatment: 10 Active Inactive Abuse / Safety / Falls / Self Care Management Nursing Diagnoses: Impaired home maintenance Impaired physical mobility Knowledge deficit related to: safety; personal, health (wound), emergency Potential for falls Self care deficit: actual or potential Goals: Patient will remain injury free Date Initiated: 03/13/2015 Goal Status: Active Patient/caregiver will verbalize understanding of skin care regimen Date Initiated: 03/13/2015 Goal Status: Active Patient/caregiver will verbalize/demonstrate measure taken to improve self care Date Initiated: 03/13/2015 Goal Status: Active Patient/caregiver will verbalize/demonstrate measures taken to improve the patient's personal safety Date Initiated: 03/13/2015 Goal Status: Active Patient/caregiver will verbalize/demonstrate measures taken to prevent injury and/or falls Date Initiated:  03/13/2015 Goal Status: Active Patient/caregiver will verbalize/demonstrate understanding of what to do in case of emergency Date Initiated: 03/13/2015 Goal Status: Active Interventions: Assess fall risk on admission and as needed Assess: immobility, friction, shearing, incontinence upon admission and as needed Assess impairment of mobility on admission and as needed per policy Assess self care needs on admission and as needed Provide education on basic hygiene Swigert, [place]. ([id number]) Provide education on fall prevention Provide education on personal and home safety Provide education on safe transfers Treatment Activities: Education provided on Basic Hygiene : 03/13/2015 Notes: Orientation to the Wound Care Program Nursing Diagnoses: Knowledge deficit related to the wound healing center program Goals: Patient/caregiver will verbalize understanding of the [place] Date Initiated: 03/13/2015 Goal Status: Active Interventions: Provide education on orientation to the wound center Notes: Pressure Nursing Diagnoses: Knowledge deficit related to causes and risk factors for pressure ulcer development Knowledge deficit related to management of pressures ulcers Potential for impaired tissue integrity related to pressure, friction, moisture, and shear Goals: Patient will remain free from development of additional pressure ulcers Date Initiated: 03/13/2015 Goal Status: Active Patient will remain free of pressure ulcers Date Initiated: 03/13/2015 Goal Status: Active Patient/caregiver will verbalize risk  factors for pressure ulcer development Date Initiated: 03/13/2015 Goal Status: Active Patient/caregiver will verbalize understanding of pressure ulcer management Date Initiated: 03/13/2015 Goal Status: Active Interventions: Assess: immobility, friction, shearing, incontinence upon admission and as needed Horn, Karel H. ([id number]) Assess offloading mechanisms upon  admission and as needed Assess potential for pressure ulcer upon admission and as needed Provide education on pressure ulcers Treatment Activities: Patient referred for home evaluation of offloading devices/mattresses : 07/29/2015 Patient referred for pressure reduction/relief devices : 07/29/2015 Patient referred for seating evaluation to ensure proper offloading : 07/29/2015 Pressure reduction/relief device ordered : 07/29/2015 Test ordered outside of clinic : 07/29/2015 Notes: Wound/Skin Impairment Nursing Diagnoses: Impaired tissue integrity Knowledge deficit related to ulceration/compromised skin integrity Goals: Patient/caregiver will verbalize understanding of skin care regimen Date Initiated: 03/13/2015 Goal Status: Active Ulcer/skin breakdown will have a volume reduction of 30% by week 4 Date Initiated: 03/13/2015 Goal Status: Active Ulcer/skin breakdown will have a volume reduction of 50% by week 8 Date Initiated: 03/13/2015 Goal Status: Active Ulcer/skin breakdown will have a volume reduction of 80% by week 12 Date Initiated: 03/13/2015 Goal Status: Active Ulcer/skin breakdown will heal within 14 weeks Date Initiated: 03/13/2015 Goal Status: Active Interventions: Assess patient/caregiver ability to obtain necessary supplies Assess patient/caregiver ability to perform ulcer/skin care regimen upon admission and as needed Assess ulceration(s) every visit Provide education on smoking Provide education on ulcer and skin care Treatment Activities: Patient referred to home care : 07/29/2015 TOR, [place]. ([id number]) Referred to DME Zarian Colpitts for dressing supplies : 07/29/2015 Skin care regimen initiated : 07/29/2015 Topical wound management initiated : 07/29/2015 Notes: Electronic Signature(s) Signed: 07/29/2015 5:36:16 PM By: Gretta Cool, RN, BSN, Kim RN, BSN Entered By: Gretta Cool, RN, BSN, Kim on 07/29/2015 16:17:54 Kimrey, [place].  ([id number]) -------------------------------------------------------------------------------- Pain Assessment Details Patient Name: Dues, Salvatore H. Date of Service: 07/29/2015 3:30 PM Medical Record Number: [id number] Patient Account Number: [id number] Date of Birth/Sex: October 11, 1945 (69 y.o. Female) Treating RN: Cornell Barman Primary Care Physician: Paulita Cradle Other Clinician: Referring Physician: Paulita Cradle Treating Physician/Extender: Frann Rider in Treatment: 59 Active Problems Location of Pain Severity and Description of Pain Patient Has Paino Yes Site Locations Pain Location: Pain in Ulcers With Dressing Change: Yes Duration of the Pain. Constant / Intermittento Intermittent Character of Pain Describe the Pain: Tender Pain Management and Medication Current Pain Management: Medication: Yes Electronic Signature(s) Signed: 07/29/2015 5:36:16 PM By: Gretta Cool, RN, BSN, Kim RN, BSN Entered By: Gretta Cool, RN, BSN, Kim on 07/29/2015 15:46:14 [place], Shella H. ([id number]) -------------------------------------------------------------------------------- Patient/Caregiver Education Details Patient Name: [name], Ginelle H. Date of Service: 07/29/2015 3:30 PM Medical Record Number: [id number] Patient Account Number: [id number] Date of Birth/Gender: 02-Nov-1945 (69 y.o. Female) Treating RN: Cornell Barman Primary Care Physician: Paulita Cradle Other Clinician: Referring Physician: Paulita Cradle Treating Physician/Extender: Frann Rider in Treatment: 39 Education Assessment Education Provided To: Patient Education Topics Provided Wound/Skin Impairment: Handouts: Caring for Your Ulcer Methods: Demonstration, Explain/Verbal Responses: State content correctly Electronic Signature(s) Signed: 07/29/2015 5:36:16 PM By: Gretta Cool, RN, BSN, Kim RN, BSN Entered By: Gretta Cool, RN, BSN, Kim on 07/29/2015 16:23:26 Pfohl, Dayami H.  ([id number]) -------------------------------------------------------------------------------- Wound Assessment Details Patient Name: Huhta, Jinny H. Date of Service: 07/29/2015 3:30 PM Medical Record Number: [id number] Patient Account Number: [id number] Date of Birth/Sex: Mar 26, 1946 (69 y.o. Female) Treating RN: Cornell Barman Primary Care Physician: Paulita Cradle Other Clinician: Referring Physician: Paulita Cradle Treating Physician/Extender: Frann Rider in Treatment: 19 Wound Status Wound Number: 2 Primary Etiology: Pressure Ulcer Wound Location: Right Elbow  Wound Status: Open Wounding Event: Gradually Appeared Date Acquired: 02/25/2015 Weeks Of Treatment: 19 Clustered Wound: No Photos Photo Uploaded By: Gretta Cool, RN, BSN, Kim on 07/29/2015 17:39:25 Wound Measurements Length: (cm) 0 Width: (cm) 0 Depth: (cm) 0 Area: (cm) 0 Volume: (cm) 0 % Reduction in Area: 100% % Reduction in Volume: 100% Wound Description Classification: Category/Stage III Periwound Skin Texture Texture Color No Abnormalities Noted: No No Abnormalities Noted: No Moisture No Abnormalities Noted: No Electronic Signature(s) Signed: 07/29/2015 5:36:16 PM By: Gretta Cool, RN, BSN, Kim RN, BSN Swalley, Naviah HMarland Kitchen ([id number]) Entered By: Gretta Cool, RN, BSN, Kim on 07/29/2015 15:55:53 Adsit, Nichelle H. ([id number]) -------------------------------------------------------------------------------- Wound Assessment Details Patient Name: Sneeringer, Corabelle H. Date of Service: 07/29/2015 3:30 PM Medical Record Number: [id number] Patient Account Number: [id number] Date of Birth/Sex: Jul 15, 1946 (69 y.o. Female) Treating RN: Cornell Barman Primary Care Physician: Paulita Cradle Other Clinician: Referring Physician: Paulita Cradle Treating Physician/Extender: Frann Rider in Treatment: 19 Wound Status Wound Number: 3 Primary Etiology: Pressure Ulcer Wound Location: Midline Back Wound Status:  Open Wounding Event: Pressure Injury Date Acquired: 05/02/2015 Weeks Of Treatment: 12 Clustered Wound: No Photos Photo Uploaded By: Gretta Cool, RN, BSN, Kim on 07/29/2015 17:39:25 Wound Measurements Length: (cm) 1.2 Width: (cm) 1.5 Depth: (cm) 0.2 Area: (cm) 1.414 Volume: (cm) 0.283 % Reduction in Area: -542.7% % Reduction in Volume: -1186.4% Wound Description Classification: Category/Stage II Periwound Skin Texture Texture Color No Abnormalities Noted: No No Abnormalities Noted: No Moisture No Abnormalities Noted: No Treatment Notes Wound #3 (Midline Back) 1. Cleansed with: Glymph, Matilde H. ([id number]) Clean wound with Normal Saline 2. Anesthetic Topical Lidocaine 4% cream to wound bed prior to debridement 4. Dressing Applied: Aquacel Santyl Ointment 5. Secondary Dressing Applied Bordered Foam Dressing Electronic Signature(s) Signed: 07/29/2015 5:36:16 PM By: Gretta Cool, RN, BSN, Kim RN, BSN Entered By: Gretta Cool, RN, BSN, Kim on 07/29/2015 15:55:53 Bayly, Sueko H. ([id number]) -------------------------------------------------------------------------------- Wound Assessment Details Patient Name: Winger, Jadalyn H. Date of Service: 07/29/2015 3:30 PM Medical Record Number: [id number] Patient Account Number: [id number] Date of Birth/Sex: May 18, 1946 (69 y.o. Female) Treating RN: Cornell Barman Primary Care Physician: Paulita Cradle Other Clinician: Referring Physician: Paulita Cradle Treating Physician/Extender: Frann Rider in Treatment: 19 Wound Status Wound Number: 4 Primary Etiology: Pressure Ulcer Wound Location: Right Calcaneous Wound Status: Open Wounding Event: Pressure Injury Date Acquired: 05/11/2015 Weeks Of Treatment: 10 Clustered Wound: No Photos Photo Uploaded By: Gretta Cool, RN, BSN, Kim on 07/29/2015 17:39:27 Wound Measurements Length: (cm) 0.4 Width: (cm) 0.5 Depth: (cm) 0.1 Area: (cm) 0.157 Volume: (cm) 0.016 % Reduction in Area: 95.7% %  Reduction in Volume: 95.6% Wound Description Classification: Category/Stage III Periwound Skin Texture Texture Color No Abnormalities Noted: No No Abnormalities Noted: No Moisture No Abnormalities Noted: No Treatment Notes Wound #4 (Right Calcaneous) 1. Cleansed with: Scala, Cecilee H. ([id number]) Clean wound with Normal Saline 2. Anesthetic Topical Lidocaine 4% cream to wound bed prior to debridement 4. Dressing Applied: Aquacel Santyl Ointment 5. Secondary Dressing Applied Bordered Foam Dressing Electronic Signature(s) Signed: 07/29/2015 5:36:16 PM By: Gretta Cool, RN, BSN, Kim RN, BSN Entered By: Gretta Cool, RN, BSN, Kim on 07/29/2015 15:55:53 Leath, Capria H. ([id number]) -------------------------------------------------------------------------------- Vitals Details Patient Name: [name], Darcell H. Date of Service: 07/29/2015 3:30 PM Medical Record Number: [id number] Patient Account Number: [id number] Date of Birth/Sex: [birth year] (69 y.o. Female) Treating RN: Cornell Barman Primary Care Physician: Paulita Cradle Other Clinician: Referring Physician: Paulita Cradle Treating Physician/Extender: Frann Rider in Treatment: 19 Vital Signs Time Taken: 15:36 Temperature (F): 98.2 Height (in): 65 Pulse (  bpm): 106 Weight (lbs): 118 Respiratory Rate (breaths/min): 18 Body Mass Index (BMI): 19.6 Blood Pressure (mmHg): 123/105 Reference Range: 80 - 120 mg / dl Electronic Signature(s) Signed: 07/29/2015 5:36:16 PM By: Gretta Cool, RN, BSN, Kim RN, BSN Entered By: Gretta Cool, RN, BSN, Kim on 07/29/2015 15:47:06

## 2015-07-30 NOTE — Progress Notes (Signed)
Mckenzie, [name] ([id number]) Visit Report for 07/29/2015 Chief Complaint Document Details Patient Name: [name], [name] 07/29/2015 3:30 Date of Service: PM Medical Record [id number] Number: Patient Account Number: [id number] 20-Nov-1945 (69 y.o. Treating RN: Cornell Barman Date of Birth/Sex: Female) Other Clinician: Primary Care Physician: Carrie Mew, MIRIAM Treating Christin Fudge Referring Physician: Paulita Cradle Physician/Extender: Suella Grove in Treatment: 19 Information Obtained from: Patient Chief Complaint Patient presents to the wound care center for a consult due non healing wound. Ulcers on the right elbow and the right heel for about 1 month. Electronic Signature(s) Signed: 07/29/2015 4:25:36 PM By: Christin Fudge MD, FACS Entered By: Christin Fudge on 07/29/2015 16:25:35 [name], [name] Kitchen ([id number]) -------------------------------------------------------------------------------- Debridement Details Patient Name: [name] 07/29/2015 3:30 Date of Service: PM Medical Record [id number] Number: Patient Account Number: [id number] 1946/09/13 (69 y.o. Treating RN: Cornell Barman Date of Birth/Sex: Female) Other Clinician: Primary Care Physician: Carrie Mew, MIRIAM Treating Elizabth Palka Referring Physician: Paulita Cradle Physician/Extender: Suella Grove in Treatment: 19 Debridement Performed for Wound #4 Right Calcaneous Assessment: Performed By: Physician Christin Fudge, MD Debridement: Debridement Pre-procedure Yes Verification/Time Out Taken: Start Time: 16:15 Pain Control: Other : lidocaine 4% Level: Skin/Subcutaneous Tissue Total Area Debrided (L x 0.4 (cm) x 0.5 (cm) = 0.2 (cm) W): Tissue and other Viable, Non-Viable, Eschar, Exudate, Fibrin/Slough, Subcutaneous material debrided: Instrument: Forceps, Scissors Bleeding: Minimum Hemostasis Achieved: Pressure End Time: 16:22 Procedural Pain: 0 Post Procedural Pain: 0 Response to Treatment: Procedure was  tolerated well Post Debridement Measurements of Total Wound Length: (cm) 0.4 Stage: Category/Stage III Width: (cm) 0.5 Depth: (cm) 0.2 Volume: (cm) 0.031 Post Procedure Diagnosis Same as Pre-procedure Electronic Signature(s) Signed: 07/29/2015 4:58:13 PM By: Christin Fudge MD, FACS Signed: 07/29/2015 5:36:16 PM By: Gretta Cool RN, BSN, Kim RN, BSN Entered By: Gretta Cool, RN, BSN, Kim on 07/29/2015 16:53:44 [name] -------------------------------------------------------------------------------- HPI Details Patient Name: [name] 07/29/2015 3:30 Date of Service: PM Medical Record [id number] Number: Patient Account Number: [id number] September 05, 1946 (69 y.o. Treating RN: Cornell Barman Date of Birth/Sex: Female) Other Clinician: Primary Care Physician: Carrie Mew, MIRIAM Treating Christin Fudge Referring Physician: Paulita Cradle Physician/Extender: Suella Grove in Treatment: 19 History of Present Illness Location: Ulceration on the right heel and the right elbow. Quality: Patient reports experiencing a dull pain to affected area(s). Severity: Patient states wound (s) are getting better. Duration: Patient has had the wound for < 4 weeks prior to presenting for treatment Timing: Pain in wound is Intermittent (comes and goes Context: The wound appeared gradually over time Modifying Factors: Consults to this date include:Augmentin and Bactrim and also some heel protection with duoderm Associated Signs and Symptoms: Patient reports having difficulty standing for long periods. HPI Description: 69 year old female with history of peripheral neuropathy, history of diet controlled diabetes mellitus type 2, history of alcoholism here for wound consult sent by her PCP Dr. Sherilyn Cooter. She has pressure ulcers at her right elbow, bilateral heels. Plain films of right calcaneus without acute bony process. Patient started by PCP on Augmentin, Bactrim as per orders, DuoDerm dressings applied -  reports some improvement in her ulcer since last seen. Denies fever, chills, nausea, vomiting, diarrhea. She had a right humerus fracture in the middle of May and has had no surgery and arm is in a sling. She is also been laying in the bed for quite a while. Past medical history significant for essential hypertension, osteoporosis, peripheral neuropathy, alcoholism, ataxia, personal history of breast cancer treated with surgery chemotherapy and radiation and this was done in December 2010.  she is also status post laparoscopic cholecystectomy, pilonidal cyst excision, subcutaneous port placement, partial mastectomy on the left side, skin cancer removal. 03/21/2015 -- she says overall she's been doing better and continues to smoke about 15 cigarettes a day. 03/21/2015 - her orthopedic doctor has said she may require surgery for her right humerus fracture. 04/04/2015 -- her orthopedic surgery has been scheduled for August 11. 04/18/2015 -- she is doing fine as far as her elbow and her right heel goes but she has developed some redness over prominence on her thoracic spine and wanted me to take a look at this. 05/02/2015 -- she had her surgery done and now is in a sling and support. Her back has developed a pressure injury of undetermined stage. She seems to be in better spirits. 05/16/2015 -- last week her right heel was looking great and we had healed it out, but she has not been offloading appropriately and has a deep tissue injury on the right heel again. The area on her right elbow has opened out with slough and the area in the thoracic spine is also getting worse. 05/30/2015 -- she has developed 2 new ulcerations one on her left ischial tuberosity and one on the sacral region. She is still working on getting up smoking but is also unable to take her vitamins as she says she [name], [name] develops a diarrhea when she takes vitamins. She has increased her intake of  proteins. 06/06/2015 -- the patient's husband manages to get her a low air loss mattress with initiating pressure but did not know how to use it exactly and the patient was not happy about using it. Overall she says she's been feeling better. 07/11/2015 -- . Discussed a surgical opinion for debridement and application of a wound VAC, 2 weeks ago but the patient has been reluctant to get a surgical opinion as she wants to avoid surgery. 07/18/2015 -- they have an appointment to see Dr. Tamala Julian this coming Wednesday. 07/29/2015 -- they saw Dr. Tamala Julian in his office and he did a debridement of the wound and removed significant amount of slough. This is in addition to the debridement I had done previously on Friday where a large amount of the eschar was removed. He did not recommend the application of wound VAC. Addendum : Dr. Thompson Caul note was reviewed via EPIC and details noted as above Electronic Signature(s) Signed: 07/29/2015 4:57:49 PM By: Christin Fudge MD, FACS Previous Signature: 07/29/2015 4:26:58 PM Version By: Christin Fudge MD, FACS Entered By: Christin Fudge on 07/29/2015 16:57:49 [name], [name] ([id number]) -------------------------------------------------------------------------------- Physical Exam Details Patient Name: [name] 07/29/2015 3:30 Date of Service: PM Medical Record [id number] Number: Patient Account Number: [id number] May 11, 1946 (69 y.o. Treating RN: Cornell Barman Date of Birth/Sex: Female) Other Clinician: Primary Care Physician: Carrie Mew, MIRIAM Treating Christin Fudge Referring Physician: Paulita Cradle Physician/Extender: Weeks in Treatment: 19 Constitutional . Pulse regular. Respirations normal and unlabored. Afebrile. . Eyes Nonicteric. Reactive to light. Ears, Nose, Mouth, and Throat Lips, teeth, and gums WNL.Marland Kitchen Moist mucosa without lesions . Neck supple and nontender. No palpable supraclavicular or cervical adenopathy. Normal sized without  goiter. Respiratory WNL. No retractions.. Cardiovascular Pedal Pulses WNL. No clubbing, cyanosis or edema. Lymphatic No adneopathy. No adenopathy. No adenopathy. Musculoskeletal Adexa without tenderness or enlargement.. Digits and nails w/o clubbing, cyanosis, infection, petechiae, ischemia, or inflammatory conditions.. Integumentary (Hair, Skin) No suspicious lesions. No crepitus or fluctuance. No peri-wound warmth or erythema. No masses.Marland Kitchen Psychiatric Judgement and insight Intact.Marland Kitchen  No evidence of depression, anxiety, or agitation.. Notes the right heel had some eschar which was removed with forceps and scissors and there is a small open ulceration there. The right elbow has healed. however she has been scratching they have a lot and there is a lot of excoriation marks.The thoracic spine decubitus ulcer now has some granulation tissue and there is some slough and it is showing some excoriation around the ulcer. Electronic Signature(s) Signed: 07/29/2015 4:28:05 PM By: Christin Fudge MD, FACS Entered By: Christin Fudge on 07/29/2015 16:28:04 Tech, Venissa Lemmie [name] ([id number]) -------------------------------------------------------------------------------- Physician Orders Details Patient Name: [name] 07/29/2015 3:30 Date of Service: PM Medical Record [id number] Number: Patient Account Number: [id number] 03-24-1946 (68 y.o. Treating RN: Cornell Barman Date of Birth/Sex: Female) Other Clinician: Primary Care Physician: Carrie Mew, MIRIAM Treating Jora Galluzzo Referring Physician: Paulita Cradle Physician/Extender: Suella Grove in Treatment: 67 Verbal / Phone Orders: Yes Clinician: Cornell Barman Read Back and Verified: Yes Diagnosis Coding Wound Cleansing Wound #3 Midline Back o Clean wound with Normal Saline. o May Shower, gently pat wound dry prior to applying new dressing. o No tub bath. Wound #4 Right Calcaneous o Clean wound with Normal Saline. o May Shower, gently  pat wound dry prior to applying new dressing. o No tub bath. Anesthetic Wound #3 Midline Back o Topical Lidocaine 4% cream applied to wound bed prior to debridement Wound #4 Right Calcaneous o Topical Lidocaine 4% cream applied to wound bed prior to debridement Skin Barriers/Peri-Wound Care Wound #3 Midline Back o Barrier cream Primary Wound Dressing Wound #3 Midline Back o Santyl Ointment Wound #4 Right Calcaneous o Aquacel Ag Secondary Dressing Wound #3 Midline Back o Boardered Foam Dressing Wound #4 Right Calcaneous [place], Neelah [name]. ([id number]) o Boardered Foam Dressing Dressing Change Frequency Wound #3 Midline Back o Change dressing every other day. Wound #4 Right Calcaneous o Change dressing every other day. Follow-up Appointments Wound #3 Midline Back o Return Appointment in 1 week. Wound #4 Right Calcaneous o Return Appointment in 1 week. Off-Loading Wound #3 Midline Back o Heel suspension boot to: - Sage boots heels o Turn and reposition every 2 hours o Mattress - patient refuses use of air mattress Wound #4 Right Calcaneous o Heel suspension boot to: - Sage boots heels o Turn and reposition every 2 hours o Mattress - patient refuses use of air mattress Home Health Wound #3 Midline Back o [place] Nurse may visit PRN to address patientos wound care needs. o FACE TO FACE ENCOUNTER: MEDICARE and MEDICAID PATIENTS: I certify that this patient is under my care and that I had a face-to-face encounter that meets the physician face-to-face encounter requirements with this patient on this date. The encounter with the patient was in whole or in part for the following MEDICAL CONDITION: (primary reason for [place]) MEDICAL NECESSITY: I certify, that based on my findings, NURSING services are a medically necessary home health service. HOME BOUND STATUS: I certify that my clinical  findings support that this patient is homebound (i.e., Due to illness or injury, pt requires aid of supportive devices such as crutches, cane, wheelchairs, walkers, the use of special transportation or the assistance of another person to leave their place of residence. There is a normal inability to leave the home and doing so requires considerable and taxing effort. Other absences are for medical reasons / religious services and are infrequent or of short duration when for other reasons). o If current dressing causes regression  in wound condition, may D/C ordered dressing product/s and apply Normal Saline Moist Dressing daily until next [place] / Other MD appointment. [place] of regression in wound condition at [phone]. o Please direct any NON-WOUND related issues/requests for orders to patient's Primary Care Physician [place], Glynis [name] Kitchen ([id number]) Wound #4 Right [place] Nurse may visit PRN to address patientos wound care needs. o FACE TO FACE ENCOUNTER: MEDICARE and MEDICAID PATIENTS: I certify that this patient is under my care and that I had a face-to-face encounter that meets the physician face-to-face encounter requirements with this patient on this date. The encounter with the patient was in whole or in part for the following MEDICAL CONDITION: (primary reason for [place]) MEDICAL NECESSITY: I certify, that based on my findings, NURSING services are a medically necessary home health service. HOME BOUND STATUS: I certify that my clinical findings support that this patient is homebound (i.e., Due to illness or injury, pt requires aid of supportive devices such as crutches, cane, wheelchairs, walkers, the use of special transportation or the assistance of another person to leave their place of residence. There is a normal inability to leave the home and doing so requires considerable and  taxing effort. Other absences are for medical reasons / religious services and are infrequent or of short duration when for other reasons). o If current dressing causes regression in wound condition, may D/C ordered dressing product/s and apply Normal Saline Moist Dressing daily until next [place] / Other MD appointment. [place] of regression in wound condition at [phone]. o Please direct any NON-WOUND related issues/requests for orders to patient's Primary Care Physician Electronic Signature(s) Signed: 07/29/2015 4:35:06 PM By: Christin Fudge MD, FACS Signed: 07/29/2015 5:36:16 PM By: Gretta Cool RN, BSN, Kim RN, BSN Entered By: Gretta Cool, RN, BSN, Kim on 07/29/2015 16:18:58 [name], [name] ([id number]) -------------------------------------------------------------------------------- Problem List Details Patient Name: [name] 07/29/2015 3:30 Date of Service: PM Medical Record [id number] Number: Patient Account Number: [id number] [birth year] (70 y.o. Treating RN: Cornell Barman Date of Birth/Sex: Female) Other Clinician: Primary Care Physician: Carrie Mew, MIRIAM Treating Nykole Matos Referring Physician: Paulita Cradle Physician/Extender: Suella Grove in Treatment: 19 Active Problems ICD-10 Encounter Code Description Active Date Diagnosis E11.621 Type 2 diabetes mellitus with foot ulcer 03/13/2015 Yes L89.613 Pressure ulcer of right heel, stage 3 03/13/2015 Yes L89.013 Pressure ulcer of right elbow, stage 3 03/13/2015 Yes F17.218 Nicotine dependence, cigarettes, with other nicotine- 03/13/2015 Yes induced disorders L89.100 Pressure ulcer of unspecified part of back, unstageable 05/02/2015 Yes L89.222 Pressure ulcer of left hip, stage 2 05/30/2015 Yes L89.153 Pressure ulcer of sacral region, stage 3 05/30/2015 Yes Inactive Problems Resolved Problems Electronic Signature(s) Signed: 07/29/2015 4:25:08 PM By: Christin Fudge MD, FACS Entered By: Christin Fudge on 07/29/2015 16:25:08 [name], [name] -------------------------------------------------------------------------------- Progress Note Details Patient Name: [name] 07/29/2015 3:30 Date of Service: PM Medical Record [id number] Number: Patient Account Number: [id number] 05-13-1946 (69 y.o. Treating RN: Cornell Barman Date of Birth/Sex: Female) Other Clinician: Primary Care Physician: Carrie Mew, MIRIAM Treating Christin Fudge Referring Physician: Paulita Cradle Physician/Extender: Suella Grove in Treatment: 19 Subjective Chief Complaint Information obtained from Patient Patient presents to the wound care center for a consult due non healing wound. Ulcers on the right elbow and the right heel for about 1 month. History of Present Illness (HPI) The following HPI elements were documented for the patient's wound: Location: Ulceration on the right heel and  the right elbow. Quality: Patient reports experiencing a dull pain to affected area(s). Severity: Patient states wound (s) are getting better. Duration: Patient has had the wound for < 4 weeks prior to presenting for treatment Timing: Pain in wound is Intermittent (comes and goes Context: The wound appeared gradually over time Modifying Factors: Consults to this date include:Augmentin and Bactrim and also some heel protection with duoderm Associated Signs and Symptoms: Patient reports having difficulty standing for long periods. 69 year old female with history of peripheral neuropathy, history of diet controlled diabetes mellitus type 2, history of alcoholism here for wound consult sent by her PCP Dr. Sherilyn Cooter. She has pressure ulcers at her right elbow, bilateral heels. Plain films of right calcaneus without acute bony process. Patient started by PCP on Augmentin, Bactrim as per orders, DuoDerm dressings applied - reports some improvement in her ulcer since last seen. Denies fever, chills, nausea, vomiting,  diarrhea. She had a right humerus fracture in the middle of May and has had no surgery and arm is in a sling. She is also been laying in the bed for quite a while. Past medical history significant for essential hypertension, osteoporosis, peripheral neuropathy, alcoholism, ataxia, personal history of breast cancer treated with surgery chemotherapy and radiation and this was done in December 2010. she is also status post laparoscopic cholecystectomy, pilonidal cyst excision, subcutaneous port placement, partial mastectomy on the left side, skin cancer removal. 03/21/2015 -- she says overall she's been doing better and continues to smoke about 15 cigarettes a day. 03/21/2015 - her orthopedic doctor has said she may require surgery for her right humerus fracture. 04/04/2015 -- her orthopedic surgery has been scheduled for August 11. 04/18/2015 -- she is doing fine as far as her elbow and her right heel goes but she has developed some [name], [name] redness over prominence on her thoracic spine and wanted me to take a look at this. 05/02/2015 -- she had her surgery done and now is in a sling and support. Her back has developed a pressure injury of undetermined stage. She seems to be in better spirits. 05/16/2015 -- last week her right heel was looking great and we had healed it out, but she has not been offloading appropriately and has a deep tissue injury on the right heel again. The area on her right elbow has opened out with slough and the area in the thoracic spine is also getting worse. 05/30/2015 -- she has developed 2 new ulcerations one on her left ischial tuberosity and one on the sacral region. She is still working on getting up smoking but is also unable to take her vitamins as she says she develops a diarrhea when she takes vitamins. She has increased her intake of proteins. 06/06/2015 -- the patient's husband manages to get her a low air loss mattress with initiating  pressure but did not know how to use it exactly and the patient was not happy about using it. Overall she says she's been feeling better. 07/11/2015 -- . Discussed a surgical opinion for debridement and application of a wound VAC, 2 weeks ago but the patient has been reluctant to get a surgical opinion as she wants to avoid surgery. 07/18/2015 -- they have an appointment to see Dr. Tamala Julian this coming Wednesday. 07/29/2015 -- they saw Dr. Tamala Julian in his office and he did a debridement of the wound and removed significant amount of slough. This is in addition to the debridement I had done previously on Friday where a  large amount of the eschar was removed. He did not recommend the application of wound VAC. Addendum : Dr. Thompson Caul note was reviewed via EPIC and details noted as above Objective Constitutional Pulse regular. Respirations normal and unlabored. Afebrile. Vitals Time Taken: 3:36 PM, Height: 65 in, Weight: 118 lbs, BMI: 19.6, Temperature: 98.2 F, Pulse: 106 bpm, Respiratory Rate: 18 breaths/min, Blood Pressure: 123/105 mmHg. Eyes Nonicteric. Reactive to light. Ears, Nose, Mouth, and Throat Lips, teeth, and gums WNL.Marland Kitchen Moist mucosa without lesions . Neck supple and nontender. No palpable supraclavicular or cervical adenopathy. Normal sized without goiter. Respiratory WNL. No retractions.. Cardiovascular Pedal Pulses WNL. No clubbing, cyanosis or edema. [place], Shela [name]. ([id number]) Lymphatic No adneopathy. No adenopathy. No adenopathy. Musculoskeletal Adexa without tenderness or enlargement.. Digits and nails w/o clubbing, cyanosis, infection, petechiae, ischemia, or inflammatory conditions.Marland Kitchen Psychiatric Judgement and insight Intact.. No evidence of depression, anxiety, or agitation.. General Notes: the right heel had some eschar which was removed with forceps and scissors and there is a small open ulceration there. The right elbow has healed. however she has been scratching they  have a lot and there is a lot of excoriation marks.The thoracic spine decubitus ulcer now has some granulation tissue and there is some slough and it is showing some excoriation around the ulcer. Integumentary (Hair, Skin) No suspicious lesions. No crepitus or fluctuance. No peri-wound warmth or erythema. No masses.. Wound #2 status is Open. Original cause of wound was Gradually Appeared. The wound is located on the Right Elbow. The wound measures 0cm length x 0cm width x 0cm depth; 0cm^2 area and 0cm^3 volume. Wound #3 status is Open. Original cause of wound was Pressure Injury. The wound is located on the Midline Back. The wound measures 1.2cm length x 1.5cm width x 0.2cm depth; 1.414cm^2 area and 0.283cm^3 volume. Wound #4 status is Open. Original cause of wound was Pressure Injury. The wound is located on the Right Calcaneous. The wound measures 0.4cm length x 0.5cm width x 0.1cm depth; 0.157cm^2 area and 0.016cm^3 volume. Assessment Active Problems ICD-10 E11.621 - Type 2 diabetes mellitus with foot ulcer L89.613 - Pressure ulcer of right heel, stage 3 L89.013 - Pressure ulcer of right elbow, stage 3 F17.218 - Nicotine dependence, cigarettes, with other nicotine-induced disorders L89.100 - Pressure ulcer of unspecified part of back, unstageable [id number] - Pressure ulcer of left hip, stage 2 L89.153 - Pressure ulcer of sacral region, stage 3 [name], [name] After reviewing all her wounds I have recommended: 1. Aquacel Ag and a heel protector for the right heel. 2. The right elbow is healed and I would keep it open and prevent her from scratching the surrounding skin. If required a over-the-counter steroid cream may be applied to prevent her scratching. 3. continue to use Santyl on the thoracic wound and use a very thin layer of fat and appropriately protect the skin around it and offload this area. 4. Continue to improve her nutrition and work on her smoking  cessation. 5. I'll see her back next week. Procedures Wound #4 Wound #4 is a Pressure Ulcer located on the Right Calcaneous . There was a Skin/Subcutaneous Tissue Debridement [id number]) debridement with total area of 0.2 sq cm performed by Christin Fudge, MD. with the following instrument(s): Forceps and Scissors to remove Viable and Non-Viable tissue/material including Exudate, Fibrin/Slough, Eschar, and Subcutaneous after achieving pain control using Other (lidocaine 4%). A time out was conducted prior to the start of the procedure. A Minimum amount of bleeding was  controlled with Pressure. The procedure was tolerated well with a pain level of 0 throughout and a pain level of 0 following the procedure. Post Debridement Measurements: 0.4cm length x 0.5cm width x 0.2cm depth; 0.031cm^3 volume. Post debridement Stage noted as Category/Stage III. Post procedure Diagnosis Wound #4: Same as Pre-Procedure Plan Wound Cleansing: Wound #3 Midline Back: Clean wound with Normal Saline. May Shower, gently pat wound dry prior to applying new dressing. No tub bath. Wound #4 Right Calcaneous: Clean wound with Normal Saline. May Shower, gently pat wound dry prior to applying new dressing. No tub bath. Anesthetic: Wound #3 Midline Back: Topical Lidocaine 4% cream applied to wound bed prior to debridement Wound #4 Right Calcaneous: Topical Lidocaine 4% cream applied to wound bed prior to debridement Skin Barriers/Peri-Wound Care: Wound #3 Midline Back: Barrier cream Primary Wound Dressing: Wound #3 Midline Back: Santyl Ointment [name], [name] ([id number]) Wound #4 Right Calcaneous: Aquacel Ag Secondary Dressing: Wound #3 Midline Back: Boardered Foam Dressing Wound #4 Right Calcaneous: Boardered Foam Dressing Dressing Change Frequency: Wound #3 Midline Back: Change dressing every other day. Wound #4 Right Calcaneous: Change dressing every other day. Follow-up Appointments: Wound #3  Midline Back: Return Appointment in 1 week. Wound #4 Right Calcaneous: Return Appointment in 1 week. Off-Loading: Wound #3 Midline Back: Heel suspension boot to: - Sage boots heels Turn and reposition every 2 hours Mattress - patient refuses use of air mattress Wound #4 Right Calcaneous: Heel suspension boot to: - Sage boots heels Turn and reposition every 2 hours Mattress - patient refuses use of air mattress Home Health: Wound #3 Midline Back: [place] Nurse may visit PRN to address patient s wound care needs. FACE TO FACE ENCOUNTER: MEDICARE and MEDICAID PATIENTS: I certify that this patient is under my care and that I had a face-to-face encounter that meets the physician face-to-face encounter requirements with this patient on this date. The encounter with the patient was in whole or in part for the following MEDICAL CONDITION: (primary reason for [place]) MEDICAL NECESSITY: I certify, that based on my findings, NURSING services are a medically necessary home health service. HOME BOUND STATUS: I certify that my clinical findings support that this patient is homebound (i.e., Due to illness or injury, pt requires aid of supportive devices such as crutches, cane, wheelchairs, walkers, the use of special transportation or the assistance of another person to leave their place of residence. There is a normal inability to leave the home and doing so requires considerable and taxing effort. Other absences are for medical reasons / religious services and are infrequent or of short duration when for other reasons). If current dressing causes regression in wound condition, may D/C ordered dressing product/s and apply Normal Saline Moist Dressing daily until next [place] / Other MD appointment. [place] of regression in wound condition at [phone]. Please direct any NON-WOUND related issues/requests for orders to  patient's Primary Care Physician Wound #4 Right Calcaneous: [place] Nurse may visit PRN to address patient s wound care needs. FACE TO FACE ENCOUNTER: MEDICARE and MEDICAID PATIENTS: I certify that this patient is under my care and that I had a face-to-face encounter that meets the physician face-to-face encounter requirements with this patient on this date. The encounter with the patient was in whole or in part for the following MEDICAL CONDITION: (primary reason for [place]) MEDICAL NECESSITY: I certify, [name], [name] ([id number]) that based  on my findings, NURSING services are a medically necessary home health service. HOME BOUND STATUS: I certify that my clinical findings support that this patient is homebound (i.e., Due to illness or injury, pt requires aid of supportive devices such as crutches, cane, wheelchairs, walkers, the use of special transportation or the assistance of another person to leave their place of residence. There is a normal inability to leave the home and doing so requires considerable and taxing effort. Other absences are for medical reasons / religious services and are infrequent or of short duration when for other reasons). If current dressing causes regression in wound condition, may D/C ordered dressing product/s and apply Normal Saline Moist Dressing daily until next [place] / Other MD appointment. [place] of regression in wound condition at [phone]. Please direct any NON-WOUND related issues/requests for orders to patient's Primary Care Physician After reviewing all her wounds I have recommended: 1. Aquacel Ag and a heel protector for the right heel. 2. The right elbow is healed and I would keep it open and prevent her from scratching the surrounding skin. If required a over-the-counter steroid cream may be applied to prevent her scratching. 3. continue to use Santyl on the thoracic  wound and use a very thin layer of fat and appropriately protect the skin around it and offload this area. 4. Continue to improve her nutrition and work on her smoking cessation. 5. I'll see her back next week. Electronic Signature(s) Signed: 07/29/2015 4:58:04 PM By: Christin Fudge MD, FACS Previous Signature: 07/29/2015 4:29:54 PM Version By: Christin Fudge MD, FACS Entered By: Christin Fudge on 07/29/2015 16:58:04 Venard, Cylee [name]. ([id number]) -------------------------------------------------------------------------------- SuperBill Details Patient Name: [name], Dezyrae [name]. Date of Service: 07/29/2015 Medical Record Number: [id number] Patient Account Number: [id number] Date of Birth/Sex: [birth year] (69 y.o. 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 left hip, stage 2 L89.153 Pressure ulcer of sacral region, stage 3 Facility Procedures CPT4 Code: [id number] Description: 11042 - DEB SUBQ TISSUE 20 SQ CM/< ICD-10 Description Diagnosis E11.621 Type 2 diabetes mellitus with foot ulcer Modifier: Quantity: 1 Physician Procedures CPT4 Code Description: [id number] - WC PHYS LEVEL 3 - EST PT ICD-10 Description Diagnosis 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 ([id number]) Modifier: Quantity: 1 Electronic Signature(s) Signed: 07/29/2015 4:58:13 PM By: Christin Fudge MD, FACS Signed: 07/29/2015 5:36:16 PM By: Gretta Cool RN, BSN, Kim RN, BSN Previous Signature: 07/29/2015 4:30:18 PM Version By: Christin Fudge MD, FACS Entered By: Gretta Cool RN, BSN, Kim on 07/29/2015 16:54:29

## 2015-08-05 ENCOUNTER — Encounter: Payer: Medicare Other | Admitting: Surgery

## 2015-08-05 DIAGNOSIS — E11621 Type 2 diabetes mellitus with foot ulcer: Secondary | ICD-10-CM | POA: Diagnosis not present

## 2015-08-06 ENCOUNTER — Other Ambulatory Visit: Payer: Self-pay

## 2015-08-06 ENCOUNTER — Ambulatory Visit: Payer: Medicare Other

## 2015-08-06 NOTE — Progress Notes (Signed)
[name], [name] ([id number]) Visit Report for 08/05/2015 Arrival Information Details Patient Name: [name], [name] Date of Service: 08/05/2015 2:45 PM Medical Record Number: [id number] Patient Account Number: [id number] Date of Birth/Sex: 1946-01-07 (69 y.o. Female) Treating RN: [name], [name] Primary Care Physician: [name] Other Clinician: Referring Physician: Paulita [name] Treating Physician/Extender: [name] in Treatment: 20 Visit Information History Since Last Visit Any new allergies or adverse reactions: No Patient Arrived: [name] Had a fall or experienced change in No Arrival Time: 14:53 activities of daily living that may affect Accompanied By: spouse risk of falls: Transfer Assistance: None Signs or symptoms of abuse/neglect since last No Patient Identification Verified: Yes visito Secondary Verification Process Completed: Yes Hospitalized since last visit: No Patient Requires Transmission-Based No Has Dressing in Place as Prescribed: Yes Precautions: Pain Present Now: No Patient Has Alerts: No Electronic Signature(s) Signed: 08/05/2015 2:53:52 PM By: [name] BSN, RN Entered By: [name] on 08/05/2015 14:53:52 [name], [name] ([id number]) -------------------------------------------------------------------------------- Clinic Level of Care Assessment Details Patient Name: [name] H. Date of Service: 08/05/2015 2:45 PM Medical Record Number: [id number] Patient Account Number: [id number] Date of Birth/Sex: 11/15/1945 (69 y.o. Female) Treating RN: [name], [place] Primary Care Physician: [name] Other Clinician: Referring Physician: Paulita [name] Treating Physician/Extender: [name] in Treatment: 20 Clinic Level of Care Assessment Items TOOL 4 Quantity Score []  - Use when only an EandM is performed on FOLLOW-UP visit 0 ASSESSMENTS - Nursing Assessment / Reassessment X - Reassessment of  Co-morbidities (includes updates in patient status) 1 10 X - Reassessment of Adherence to Treatment Plan 1 5 ASSESSMENTS - Wound and Skin Assessment / Reassessment []  - Simple Wound Assessment / Reassessment - one wound 0 X - Complex Wound Assessment / Reassessment - multiple wounds 3 5 []  - Dermatologic / Skin Assessment (not related to wound area) 0 ASSESSMENTS - Focused Assessment []  - Circumferential Edema Measurements - multi extremities 0 []  - Nutritional Assessment / Counseling / Intervention 0 X - Lower Extremity Assessment (monofilament, tuning fork, pulses) 1 5 []  - Peripheral Arterial Disease Assessment (using hand held doppler) 0 ASSESSMENTS - Ostomy and/or Continence Assessment and Care []  - Incontinence Assessment and Management 0 []  - Ostomy Care Assessment and Management (repouching, etc.) 0 PROCESS - Coordination of Care X - Simple Patient / Family Education for ongoing care 1 15 []  - Complex (extensive) Patient / Family Education for ongoing care 0 []  - Staff obtains Programmer, systems, Records, Test Results / Process Orders 0 []  - Staff telephones HHA, Nursing Homes / Clarify orders / etc 0 []  - Routine Transfer to another Facility (non-emergent condition) 0 [name], [name] ([id number]) []  - Routine Hospital Admission (non-emergent condition) 0 []  - New Admissions / Biomedical engineer / Ordering NPWT, Apligraf, etc. 0 []  - Emergency Hospital Admission (emergent condition) 0 []  - Simple Discharge Coordination 0 []  - Complex (extensive) Discharge Coordination 0 PROCESS - Special Needs []  - Pediatric / Minor Patient Management 0 []  - Isolation Patient Management 0 []  - Hearing / Language / Visual special needs 0 []  - Assessment of Community assistance (transportation, D/C planning, etc.) 0 []  - Additional assistance / Altered mentation 0 []  - Support Surface(s) Assessment (bed, cushion, seat, etc.) 0 INTERVENTIONS - Wound Cleansing / Measurement []  - Simple Wound  Cleansing - one wound 0 X - Complex Wound Cleansing - multiple wounds 3 5 X - Wound Imaging (photographs - any number of wounds) 1 5 X - Wound  Tracing (instead of photographs) 1 5 []  - Simple Wound Measurement - one wound 0 X - Complex Wound Measurement - multiple wounds 3 5 INTERVENTIONS - Wound Dressings X - Small Wound Dressing one or multiple wounds 3 10 []  - Medium Wound Dressing one or multiple wounds 0 []  - Large Wound Dressing one or multiple wounds 0 []  - Application of Medications - topical 0 []  - Application of Medications - injection 0 INTERVENTIONS - Miscellaneous []  - External ear exam 0 [name], [name] ([id number]) []  - Specimen Collection (cultures, biopsies, blood, body fluids, etc.) 0 []  - Specimen(s) / Culture(s) sent or taken to Lab for analysis 0 []  - Patient Transfer (multiple staff / [name] / Similar devices) 0 []  - Simple Staple / Suture removal (25 or less) 0 []  - Complex Staple / Suture removal (26 or more) 0 []  - Hypo / Hyperglycemic Management (close monitor of Blood Glucose) 0 []  - Ankle / Brachial Index (ABI) - do not check if billed separately 0 X - Vital Signs 1 5 Has the patient been seen at the hospital within the last three years: Yes Total Score: 125 Level Of Care: New/Established - Level 4 Electronic Signature(s) Signed: 08/05/2015 4:30:28 PM By: [name] BSN, RN Previous Signature: 08/05/2015 4:29:47 PM Version By: [name] BSN, RN Entered By: [name] on 08/05/2015 16:30:27 [name], [name] ([id number]) -------------------------------------------------------------------------------- Encounter Discharge Information Details Patient Name: [name], [name] Date of Service: 08/05/2015 2:45 PM Medical Record Number: [id number] Patient Account Number: [id number] Date of Birth/Sex: Oct 12, 1945 (69 y.o. Female) Treating RN: Mckenzie Gouty, RN, [name], [name] Primary Care Physician: [name] Other Clinician: Referring Physician: Paulita [name] Treating Physician/Extender: [name] in Treatment: 20 Encounter Discharge Information Items Discharge Pain Level: 0 Discharge Condition: Stable Ambulatory Status: Walker Discharge Destination: Home Transportation: Private Auto Accompanied By: spouse Schedule Follow-up Appointment: No Medication Reconciliation completed and provided to Patient/Care No Travonne Schowalter: Provided on Clinical Summary of Care: 08/05/2015 Form Type Recipient Paper Patient AS Electronic Signature(s) Signed: 08/05/2015 3:41:03 PM By: [name] BSN, RN Previous Signature: 08/05/2015 3:36:46 PM Version By: Ruthine Dose Entered By: [name] on 08/05/2015 15:41:03 Kyser, Tashiya H. ([id number]) -------------------------------------------------------------------------------- Lower Extremity Assessment Details Patient Name: Loma, Telicia H. Date of Service: 08/05/2015 2:45 PM Medical Record Number: [id number] Patient Account Number: [id number] Date of Birth/Sex: [birth year] (69 y.o. Female) Treating RN: [name], [name] Primary Care Physician: [name] Other Clinician: Referring Physician: Paulita [name] Treating Physician/Extender: [name] in Treatment: 20 Vascular Assessment Pulses: Posterior Tibial Dorsalis Pedis Palpable: [Right:Yes] Extremity colors, hair growth, and conditions: Extremity Color: [Right:Normal] Hair Growth on Extremity: [Right:No] Temperature of Extremity: [Right:Warm] Capillary Refill: [Right:< 3 seconds] Toe Nail Assessment Left: Right: Thick: No Discolored: No Deformed: No Improper Length and Hygiene: No Electronic Signature(s) Signed: 08/05/2015 2:55:35 PM By: [name] BSN, RN Entered By: [name] on 08/05/2015 14:55:35 Rodenberg, Ellaina H. ([id number]) -------------------------------------------------------------------------------- Multi Wound Chart Details Patient Name: [name], Madelynn H. Date of Service: 08/05/2015 2:45  PM Medical Record Number: [id number] Patient Account Number: [id number] Date of Birth/Sex: 1945/11/05 (69 y.o. 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] [4:Pressure Ulcer] [7:Pressure Ulcer] Comorbid History: [3:Cataracts, Asthma, Hypertension, Type II Diabetes, Neuropathy, Received Chemotherapy] [4:Cataracts, Asthma, Hypertension, Type II Diabetes, Neuropathy, Received Chemotherapy] [7:Cataracts, Asthma, Hypertension, Type II Diabetes,  Neuropathy, Received Chemotherapy] Date Acquired: [3:05/02/2015] [4:05/11/2015] [7:08/01/2015] Weeks of Treatment: [3:13] [4:11] [7:0] Wound Status: [3:Open] [4:Open] [7:Open] Measurements L x W x D 2x3x0.2 [4:0.5x0.5x0.1] [7:1x4x0.1] (cm) Area (cm) : [3:4.712] [4:0.196] [7:3.142] Volume (cm) : [3:0.942] [4:0.02] [7:0.314] % Reduction in Area: [3:-2041.80%] [4:94.60%] [7:0.00%] % Reduction in Volume: -4181.80% [4:94.50%] [7:0.00%] Classification: [3:Category/Stage II] [4:Category/Stage III] [7:Category/Stage III] HBO Classification: [3:N/A] [4:Grade 1] [7:N/A] Exudate Amount: [3:Medium] [4:Medium] [7:Medium] Exudate Type: [3:Serosanguineous] [4:Serosanguineous] [7:Serosanguineous] Exudate Color: [3:red, brown] [4:red, brown] [7:red, brown] Wound Margin: [3:Distinct, outline attached] [4:Distinct, outline attached] [7:Distinct, outline attached] Granulation Amount:  [3:Medium (34-66%)] [4:Large (67-100%)] [7:Small (1-33%)] Granulation Quality: [3:N/A] [4:Pink] [7:N/A] Necrotic Amount: [3:Medium (34-66%)] [4:None Present (0%)] [7:Large (67-100%)] Necrotic Tissue: [3:Adherent Slough] [4:N/A] [7:Eschar, Adherent Slough] Exposed Structures: [3:Fascia: No Fat: No Tendon: No] [4:Fascia: No Fat: No Tendon: No] [7:Fascia: No Fat: No Tendon: No] Muscle: No Muscle: No Muscle: No Joint: No Joint: No Joint: No Bone: No Bone: No Bone: No Limited to Skin Limited to Skin Limited to Skin Breakdown Breakdown Breakdown Epithelialization: Small (1-33%) None None Periwound Skin Texture: Edema: No Edema: No Edema: No Excoriation: No Excoriation: No Excoriation: No Induration: No Induration: No Induration: No Callus: No Callus: No Callus: No Crepitus: No Crepitus: No Crepitus: No Fluctuance: No Fluctuance: No Fluctuance: No Friable: No Friable: No Friable: No Rash: No Rash: No Rash: No Scarring: No Scarring: No Scarring: No Periwound Skin Moist: Yes Moist: Yes Moist: Yes Moisture: Maceration: No Maceration: No Maceration: No Dry/Scaly: No Dry/Scaly: No Dry/Scaly: No Periwound Skin Color: Atrophie Blanche: No Atrophie Blanche: No Atrophie Blanche: No Cyanosis: No Cyanosis: No Cyanosis: No Ecchymosis: No Ecchymosis: No Ecchymosis: No Erythema: No Erythema: No Erythema: No Hemosiderin Staining: No Hemosiderin Staining: No Hemosiderin Staining: No Mottled: No Mottled: No Mottled: No Pallor: No Pallor: No Pallor: No Rubor: No Rubor: No Rubor: No Temperature: No Abnormality No Abnormality No Abnormality Tenderness on Yes Yes No Palpation: Wound Preparation: Ulcer Cleansing: Ulcer Cleansing: Ulcer Cleansing: Rinsed/Irrigated with Rinsed/Irrigated with Rinsed/Irrigated with Saline Saline Saline Topical Anesthetic Topical Anesthetic Topical Anesthetic Applied: Other: lidocaine Applied: Other: lidocaine Applied: Other:  lidocaine 4% 4% 4% Treatment Notes Electronic Signature(s) Signed: 08/05/2015 3:18:34 PM By: [name] BSN, RN Entered By: [name] on 08/05/2015 15:18:34 Farner, Lakeidra H. ([id number]) -------------------------------------------------------------------------------- Multi-Disciplinary Care Plan Details Patient Name: [name], [name] Date of Service: 08/05/2015 2:45 PM Medical Record Number: [id number] Patient Account Number: [id number] Date of Birth/Sex: [birth year] (69 y.o. Female) Treating RN: [name], [name] Primary Care Physician: [name] Other Clinician: Referring Physician: Paulita [name] Treating Physician/Extender: [name] in Treatment: 20 Active Inactive Abuse / Safety / Falls / Self Care Management Nursing Diagnoses: Impaired home maintenance Impaired physical mobility Knowledge deficit related to: safety; personal, health (wound), emergency Potential for falls Self care deficit: actual or potential Goals: Patient will remain injury free Date Initiated: 03/13/2015 Goal Status: Active Patient/caregiver will verbalize understanding of skin care regimen Date Initiated: 03/13/2015 Goal Status: Active Patient/caregiver will verbalize/demonstrate measure taken to improve self care Date Initiated: 03/13/2015 Goal Status: Active Patient/caregiver will verbalize/demonstrate measures taken to improve the patient's personal safety Date Initiated: 03/13/2015 Goal Status: Active Patient/caregiver will verbalize/demonstrate measures taken to prevent injury and/or falls Date Initiated: 03/13/2015 Goal Status: Active Patient/caregiver will  verbalize/demonstrate understanding of what to do in case of emergency Date Initiated: 03/13/2015 Goal Status: Active Interventions: Assess fall risk on admission and as needed Assess: immobility, friction, shearing, incontinence upon admission and as needed Assess impairment of mobility on admission and as needed  per policy Assess self care needs on admission and as needed Provide education on basic hygiene Caridi, Wylma H. ([id number]) Provide education on fall prevention Provide education on personal and home safety Provide education on safe transfers Treatment Activities: Education provided on Basic Hygiene : 03/13/2015 Notes: Orientation to the Wound Care Program Nursing Diagnoses: Knowledge deficit related to the wound healing center program Goals: Patient/caregiver will verbalize understanding of the [place] Program Date Initiated: 03/13/2015 Goal Status: Active Interventions: Provide education on orientation to the wound center Notes: Pressure Nursing Diagnoses: Knowledge deficit related to causes and risk factors for pressure ulcer development Knowledge deficit related to management of pressures ulcers Potential for impaired tissue integrity related to pressure, friction, moisture, and shear Goals: Patient will remain free from development of additional pressure ulcers Date Initiated: 03/13/2015 Goal Status: Active Patient will remain free of pressure ulcers Date Initiated: 03/13/2015 Goal Status: Active Patient/caregiver will verbalize risk factors for pressure ulcer development Date Initiated: 03/13/2015 Goal Status: Active Patient/caregiver will verbalize understanding of pressure ulcer management Date Initiated: 03/13/2015 Goal Status: Active Interventions: Assess: immobility, friction, shearing, incontinence upon admission and as needed Conchas, Jaidence H. ([id number]) Assess offloading mechanisms upon admission and as needed Assess potential for pressure ulcer upon admission and as needed Provide education on pressure ulcers Treatment Activities: Patient referred for home evaluation of offloading devices/mattresses : 08/05/2015 Patient referred for pressure reduction/relief devices : 08/05/2015 Patient referred for seating evaluation to ensure proper offloading :  08/05/2015 Pressure reduction/relief device ordered : 08/05/2015 Test ordered outside of clinic : 08/05/2015 Notes: Wound/Skin Impairment Nursing Diagnoses: Impaired tissue integrity Knowledge deficit related to ulceration/compromised skin integrity Goals: Patient/caregiver will verbalize understanding of skin care regimen Date Initiated: 03/13/2015 Goal Status: Active Ulcer/skin breakdown will have a volume reduction of 30% by week 4 Date Initiated: 03/13/2015 Goal Status: Active Ulcer/skin breakdown will have a volume reduction of 50% by week 8 Date Initiated: 03/13/2015 Goal Status: Active Ulcer/skin breakdown will have a volume reduction of 80% by week 12 Date Initiated: 03/13/2015 Goal Status: Active Ulcer/skin breakdown will heal within 14 weeks Date Initiated: 03/13/2015 Goal Status: Active Interventions: Assess patient/caregiver ability to obtain necessary supplies Assess patient/caregiver ability to perform ulcer/skin care regimen upon admission and as needed Assess ulceration(s) every visit Provide education on smoking Provide education on ulcer and skin care Treatment Activities: Patient referred to home care : 08/05/2015 HEDGEPETH, [place]. ([id number]) Referred to DME Maureen Duesing for dressing supplies : 08/05/2015 Skin care regimen initiated : 08/05/2015 Topical wound management initiated : 08/05/2015 Notes: Electronic Signature(s) Signed: 08/05/2015 3:18:19 PM By: [name] BSN, RN Entered By: [name] on 08/05/2015 15:18:18 Effinger, Rashia H. ([id number]) -------------------------------------------------------------------------------- Pain Assessment Details Patient Name: [name], [name] Date of Service: 08/05/2015 2:45 PM Medical Record Number: [id number] Patient Account Number: [id number] Date of Birth/Sex: 1945-12-27 (69 y.o. Female) Treating RN: Mckenzie Gouty, RN, [name], [name] Primary Care Physician: [name] Other Clinician: Referring Physician: Paulita [name] Treating Physician/Extender: [name] in Treatment: 20 Active Problems Location of Pain Severity and Description of Pain Patient Has Paino No Site Locations Pain Management and Medication Current Pain Management: Electronic Signature(s) Signed: 08/05/2015 2:55:10 PM By: [name] BSN, RN Entered By: [name] on 08/05/2015 14:55:09  Lafountain, Alashia H. ([id number]) -------------------------------------------------------------------------------- Patient/Caregiver Education Details Patient Name: Reek, Jalaya H. Date of Service: 08/05/2015 2:45 PM Medical Record Number: [id number] Patient Account Number: [id number] Date of Birth/Gender: [birth year] (69 y.o. Female) Treating RN: [name], [name] Primary Care Physician: [name] Other Clinician: Referring Physician: Paulita [name] Treating Physician/Extender: [name] in Treatment: 20 Education Assessment Education Provided To: Patient Education Topics Provided Basic Hygiene: Methods: Explain/Verbal Responses: State content correctly Pressure: Methods: Explain/Verbal Responses: State content correctly Safety: Methods: Explain/Verbal Responses: State content correctly Smoking and Wound Healing: Methods: Explain/Verbal Responses: State content correctly Welcome To [facility]: Methods: Explain/Verbal Responses: State content correctly Wound/Skin Impairment: Methods: Explain/Verbal Responses: State content correctly Electronic Signature(s) Signed: 08/05/2015 3:41:33 PM By: [name] BSN, RN Entered By: [name] on 08/05/2015 15:41:33 Harewood, Enriqueta H. ([id number]) -------------------------------------------------------------------------------- Wound Assessment Details Patient Name: Hanlon, Aftyn H. Date of Service: 08/05/2015 2:45 PM Medical Record Number: [id number] Patient Account Number: [id number] Date of Birth/Sex: 1945/12/21 (69 y.o. Female) Treating RN: Afful,  RN, [name], Preston Primary Care Physician: [name] Other Clinician: Referring Physician: Paulita [name] Treating Physician/Extender: [name] in Treatment: 20 Wound Status Wound Number: 3 Primary Pressure Ulcer Etiology: Wound Location: Back - Midline Wound Open Wounding Event: Pressure Injury Status: Date Acquired: 05/02/2015 Comorbid Cataracts, Asthma, Hypertension, Type Weeks Of Treatment: 13 History: II Diabetes, Neuropathy, Received Clustered Wound: No Chemotherapy Photos Photo Uploaded By: [name] on 08/05/2015 16:53:08 Wound Measurements Length: (cm) 2 Width: (cm) 3 Depth: (cm) 0.2 Area: (cm) 4.712 Volume: (cm) 0.942 % Reduction in Area: -2041.8% % Reduction in Volume: -4181.8% Epithelialization: Small (1-33%) Tunneling: No Undermining: No Wound Description Classification: Category/Stage II Wound Margin: Distinct, outline attached Exudate Amount: Medium Exudate Type: Serosanguineous Exudate Color: red, brown Foul Odor After Cleansing: No Wound Bed Granulation Amount: Medium (34-66%) Exposed Structure Necrotic Amount: Medium (34-66%) Fascia Exposed: No Necrotic Quality: Adherent Slough Fat Layer Exposed: No Tendon Exposed: No Chinn, Darienne H. ([id number]) Muscle Exposed: No Joint Exposed: No Bone Exposed: No Limited to Skin Breakdown Periwound Skin Texture Texture Color No Abnormalities Noted: No No Abnormalities Noted: No Callus: No Atrophie Blanche: No Crepitus: No Cyanosis: No Excoriation: No Ecchymosis: No Fluctuance: No Erythema: No Friable: No Hemosiderin Staining: No Induration: No Mottled: No Localized Edema: No Pallor: No Rash: No Rubor: No Scarring: No Temperature / Pain Moisture Temperature: No Abnormality No Abnormalities Noted: No Tenderness on Palpation: Yes Dry / Scaly: No Maceration: No Moist: Yes Wound Preparation Ulcer Cleansing: Rinsed/Irrigated with Saline Topical Anesthetic  Applied: Other: lidocaine 4%, Treatment Notes Wound #3 (Midline Back) 1. Cleansed with: Clean wound with Normal Saline 2. Anesthetic Topical Lidocaine 4% cream to wound bed prior to debridement 3. Peri-wound Care: Barrier cream 4. Dressing Applied: Santyl Ointment 5. Secondary Dressing Applied Bordered Foam Dressing Electronic Signature(s) Signed: 08/05/2015 3:12:26 PM By: [name] BSN, RN Entered By: [name] on 08/05/2015 15:12:26 Mollenkopf, Nyx H. ([id number]) -------------------------------------------------------------------------------- Wound Assessment Details Patient Name: Flanary, Niki H. Date of Service: 08/05/2015 2:45 PM Medical Record Number: [id number] Patient Account Number: [id number] Date of Birth/Sex: 04/23/1946 (69 y.o. Female) Treating RN: [name], [name] Primary Care Physician: [name] Other Clinician: Referring Physician: Paulita [name] Treating Physician/Extender: [name] in Treatment: 20 Wound Status Wound Number: 4 Primary Pressure Ulcer Etiology: Wound Location: Right Calcaneous Wound Open Wounding Event: Pressure Injury Status: Date Acquired: 05/11/2015 Comorbid Cataracts, Asthma, Hypertension, Type Weeks Of Treatment: 11 History: II Diabetes, Neuropathy, Received Clustered Wound: No Chemotherapy Photos  Photo Uploaded By: [name] on 08/05/2015 16:53:09 Wound Measurements Length: (cm) 0.5 Width: (cm) 0.5 Depth: (cm) 0.1 Area: (cm) 0.196 Volume: (cm) 0.02 % Reduction in Area: 94.6% % Reduction in Volume: 94.5% Epithelialization: None Tunneling: No Undermining: No Wound Description Classification: Category/Stage III Foul O Diabetic Severity (Wagner): Grade 1 Wound Margin: Distinct, outline attached Exudate Amount: Medium Exudate Type: Serosanguineous Exudate Color: red, brown dor After Cleansing: No Wound Bed Granulation Amount: Large (67-100%) Exposed Structure Granulation Quality:  Pink Fascia Exposed: No Necrotic Amount: None Present (0%) Fat Layer Exposed: No Magid, Chantee H. ([id number]) Tendon Exposed: No Muscle Exposed: No Joint Exposed: No Bone Exposed: No Limited to Skin Breakdown Periwound Skin Texture Texture Color No Abnormalities Noted: No No Abnormalities Noted: No Callus: No Atrophie Blanche: No Crepitus: No Cyanosis: No Excoriation: No Ecchymosis: No Fluctuance: No Erythema: No Friable: No Hemosiderin Staining: No Induration: No Mottled: No Localized Edema: No Pallor: No Rash: No Rubor: No Scarring: No Temperature / Pain Moisture Temperature: No Abnormality No Abnormalities Noted: No Tenderness on Palpation: Yes Dry / Scaly: No Maceration: No Moist: Yes Wound Preparation Ulcer Cleansing: Rinsed/Irrigated with Saline Topical Anesthetic Applied: Other: lidocaine 4%, Treatment Notes Wound #4 (Right Calcaneous) 1. Cleansed with: Clean wound with Normal Saline 3. Peri-wound Care: Skin Prep 4. Dressing Applied: Aquacel Ag 5. Secondary Dressing Applied Bordered Foam Dressing Electronic Signature(s) Signed: 08/05/2015 3:15:52 PM By: [name] BSN, RN Entered By: [name] on 08/05/2015 15:15:52 [name], Tariah H. ([id number]) -------------------------------------------------------------------------------- Wound Assessment Details Patient Name: Copus, Etoile H. Date of Service: 08/05/2015 2:45 PM Medical Record Number: [id number] Patient Account Number: [id number] Date of Birth/Sex: June 04, 1946 (69 y.o. 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 Wound Open Wounding Event: Pressure Injury Status: Date Acquired: 08/01/2015 Comorbid Cataracts, Asthma,  Hypertension, Type Weeks Of Treatment: 0 History: II Diabetes, Neuropathy, Received Clustered Wound: No Chemotherapy Photos Photo Uploaded By: [name] on 08/05/2015 16:53:09 Wound Measurements Length: (cm) 1 Width: (cm) 4 Depth: (cm) 0.1 Area: (cm) 3.142 Volume: (cm) 0.314 % Reduction in Area: 0% % Reduction in Volume: 0% Epithelialization: None Tunneling: No Undermining: No Wound Description Classification: Category/Stage III Wound Margin: Distinct, outline attached Exudate Amount: Medium Exudate Type: Serosanguineous Exudate Color: red, brown Foul Odor After Cleansing: No Wound Bed Granulation Amount: Small (1-33%) Exposed Structure Necrotic Amount: Large (67-100%) Fascia Exposed: No Necrotic Quality: Eschar, Adherent Slough Fat Layer Exposed: No Tendon Exposed: No Schuchart, Dhara H. ([id number]) Muscle Exposed: No Joint Exposed: No Bone Exposed: No Limited to Skin Breakdown Periwound Skin Texture Texture Color No Abnormalities Noted: No No Abnormalities Noted: No Callus: No Atrophie Blanche: No Crepitus: No Cyanosis: No Excoriation: No Ecchymosis: No Fluctuance: No Erythema: No Friable: No Hemosiderin Staining: No Induration: No Mottled: No Localized Edema: No Pallor: No Rash: No Rubor: No Scarring: No Temperature / Pain Moisture Temperature: No Abnormality No Abnormalities Noted: No Dry / Scaly: No Maceration: No Moist: Yes Wound Preparation Ulcer Cleansing: Rinsed/Irrigated with Saline Topical Anesthetic Applied: Other: lidocaine 4%, Treatment Notes Wound #7 (Right Ischial Tuberosity) 1. Cleansed with: Clean wound with Normal Saline 2. Anesthetic Topical Lidocaine 4% cream to wound bed prior to debridement 3. Peri-wound Care: Barrier cream 4. Dressing Applied: Santyl Ointment 5. Secondary Dressing Applied Bordered Foam Dressing Electronic Signature(s) Signed: 08/05/2015 3:18:00 PM By: [name] BSN,  RN Entered By: [name]  on 08/05/2015 15:18:00 Rahrig, Shylin H. ([id number]) -------------------------------------------------------------------------------- Vitals Details Patient Name: [name], Lonni H. Date of Service: 08/05/2015 2:45 PM Medical Record Number: [id number] Patient Account Number: [id number] Date of Birth/Sex: 1945/11/06 (69 y.o. Female) Treating RN: [name], [place] Primary Care Physician: [name] Other Clinician: Referring Physician: Paulita [name] Treating Physician/Extender: [name] in Treatment: 20 Vital Signs Time Taken: 15:04 Temperature (F): 98 Height (in): 65 Pulse (bpm): 92 Weight (lbs): 118 Respiratory Rate (breaths/min): 17 Body Mass Index (BMI): 19.6 Blood Pressure (mmHg): 112/77 Reference Range: 80 - 120 mg / dl Electronic Signature(s) Signed: 08/05/2015 3:05:14 PM By: [name] BSN, RN Entered By: [name] on 08/05/2015 15:05:13

## 2015-08-06 NOTE — Progress Notes (Signed)
Mckenzie, Bialecki Lacora Lemmie [name] ([id number]) Visit Report for 08/05/2015 Chief Complaint Document Details Patient Name: [name], [name] 08/05/2015 2:45 Date of Service: PM Medical Record [id number] Number: Patient Account Number: [id number] [birth year] (69 y.o. Treating RN: Afful, RN, BSN, Velva Harman Date of Birth/Sex: Female) Other Clinician: Primary Care Physician: [name], MIRIAM Treating [name] Referring Physician: Paulita Cradle Physician/Extender: Suella Grove in Treatment: 20 Information Obtained from: Patient Chief Complaint Patient presents to the wound care center for a consult due non healing wound. Ulcers on the right elbow and the right heel for about 1 month. Electronic Signature(s) Signed: 08/05/2015 4:28:21 PM By: Christin Fudge MD, FACS Entered By: Christin Fudge on 08/05/2015 16:28:21 [name], [name] ([id number]) -------------------------------------------------------------------------------- HPI Details Patient Name: [name] 08/05/2015 2:45 Date of Service: PM Medical Record [id number] Number: Patient Account Number: [id number] [birth year] (69 y.o. Treating RN: Baruch Gouty, RN, BSN, Velva Harman Date of Birth/Sex: Female) Other Clinician: Primary Care Physician: [name], MIRIAM Treating [name] Referring Physician: Paulita Cradle Physician/Extender: Weeks in Treatment: 20 History of Present Illness Location: Ulceration on the right heel and the right elbow. Quality: Patient reports experiencing a dull pain to affected area(s). Severity: Patient states wound (s) are getting better. Duration: Patient has had the wound for < 4 weeks prior to presenting for treatment Timing: Pain in wound is Intermittent (comes and goes Context: The wound appeared gradually over time Modifying Factors: Consults to this date include:Augmentin and Bactrim and also some heel protection with duoderm Associated Signs and Symptoms: Patient reports having difficulty standing for long  periods. HPI Description: 70 year old female with history of peripheral neuropathy, history of diet controlled diabetes mellitus type 2, history of alcoholism here for wound consult sent by her PCP Dr. Sherilyn Cooter. She has pressure ulcers at her right elbow, bilateral heels. Plain films of right calcaneus without acute bony process. Patient started by PCP on Augmentin, Bactrim as per orders, DuoDerm dressings applied - reports some improvement in her ulcer since last seen. Denies fever, chills, nausea, vomiting, diarrhea. She had a right humerus fracture in the middle of May and has had no surgery and arm is in a sling. She is also been laying in the bed for quite a while. Past medical history significant for essential hypertension, osteoporosis, peripheral neuropathy, alcoholism, ataxia, personal history of breast cancer treated with surgery chemotherapy and radiation and this was done in December 2010. she is also status post laparoscopic cholecystectomy, pilonidal cyst excision, subcutaneous port placement, partial mastectomy on the left side, skin cancer removal. 03/21/2015 -- she says overall she's been doing better and continues to smoke about 15 cigarettes a day. 03/21/2015 - her orthopedic doctor has said she may require surgery for her right humerus fracture. 04/04/2015 -- her orthopedic surgery has been scheduled for August 11. 04/18/2015 -- she is doing fine as far as her elbow and her right heel goes but she has developed some redness over prominence on her thoracic spine and wanted me to take a look at this. 05/02/2015 -- she had her surgery done and now is in a sling and support. Her back has developed a pressure injury of undetermined stage. She seems to be in better spirits. 05/16/2015 -- last week her right heel was looking great and we had healed it out, but she has not been offloading appropriately and has a deep tissue injury on the right heel again. The area on her right elbow has  opened out with slough and the area in the thoracic spine is also getting worse. 05/30/2015 --  she has developed 2 new ulcerations one on her left ischial tuberosity and one on the sacral region. She is still working on getting up smoking but is also unable to take her vitamins as she says she Mckenzie, Shantese H. ([id number]) develops a diarrhea when she takes vitamins. She has increased her intake of proteins. 06/06/2015 -- the patient's husband manages to get her a low air loss mattress with initiating pressure but did not know how to use it exactly and the patient was not happy about using it. Overall she says she's been feeling better. 07/11/2015 -- . Discussed a surgical opinion for debridement and application of a wound VAC, 2 weeks ago but the patient has been reluctant to get a surgical opinion as she wants to avoid surgery. 07/18/2015 -- they have an appointment to see Dr. Tamala Julian this coming Wednesday. 07/29/2015 -- they saw Dr. Tamala Julian in his office and he did a debridement of the wound and removed significant amount of slough. This is in addition to the debridement I had done previously on Friday where a large amount of the eschar was removed. He did not recommend the application of wound VAC. Addendum : Dr. Thompson Caul note was reviewed via EPIC and details noted as above. 08/05/2015 -- over the last week she has developed a another pressure injury to her right hip and has had significant discoloration of the skin and an eschar there. Electronic Signature(s) Signed: 08/05/2015 4:28:50 PM By: Christin Fudge MD, FACS Entered By: Christin Fudge on 08/05/2015 16:28:50 [name], [name] ([id number]) -------------------------------------------------------------------------------- Physical Exam Details Patient Name: [name] 08/05/2015 2:45 Date of Service: PM Medical Record [id number] Number: Patient Account Number: [id number] 1946/08/06 (69 y.o. Treating RN: Baruch Gouty, RN, BSN, Velva Harman Date of  Birth/Sex: Female) Other Clinician: Primary Care Physician: [name], MIRIAM Treating [place] Referring Physician: Carrie [name], MIRIAM Physician/Extender: Weeks in Treatment: 20 Constitutional . Pulse regular. Respirations normal and unlabored. Afebrile. . Eyes Nonicteric. Reactive to light. Ears, Nose, Mouth, and Throat Lips, teeth, and gums WNL.Marland Kitchen Moist mucosa without lesions . Neck supple and nontender. No palpable supraclavicular or cervical adenopathy. Normal sized without goiter. Respiratory WNL. No retractions.. Cardiovascular Pedal Pulses WNL. No clubbing, cyanosis or edema. Lymphatic No adneopathy. No adenopathy. No adenopathy. Musculoskeletal Adexa without tenderness or enlargement.. Digits and nails w/o clubbing, cyanosis, infection, petechiae, ischemia, or inflammatory conditions.. Integumentary (Hair, Skin) No suspicious lesions. No crepitus or fluctuance. No peri-wound warmth or erythema. No masses.Marland Kitchen Psychiatric Judgement and insight Intact.. No evidence of depression, anxiety, or agitation.. Notes the right heel is looking clean and no debridement was required today. The right elbow has healed. The thoracic spine the venous ulcer has minimal slough and more granulation tissue. There is a new stage III pressure ulcer and injury to the right hip and this has significant deep tissue injury surrounding this. Electronic Signature(s) Signed: 08/05/2015 4:29:43 PM By: Christin Fudge MD, FACS Entered By: Christin Fudge on 08/05/2015 16:29:42 [name], [name] ([id number]) -------------------------------------------------------------------------------- Physician Orders Details Patient Name: [name] 08/05/2015 2:45 Date of Service: PM Medical Record [id number] Number: Patient Account Number: [id number] 02-04-1946 (69 y.o. Treating RN: Baruch Gouty, RN, BSN, Velva Harman Date of Birth/Sex: Female) Other Clinician: Primary Care Physician: [name], MIRIAM  Treating Shaina Gullatt Referring Physician: Paulita Cradle Physician/Extender: Suella Grove in Treatment: 20 Verbal / Phone Orders: Yes Clinician: Afful, RN, BSN, Rita Read Back and Verified: Yes Diagnosis Coding Wound Cleansing Wound #3 Midline Back o Clean wound with Normal Saline. o May Shower, gently pat wound dry  prior to applying new dressing. o No tub bath. Wound #4 Right Calcaneous o Clean wound with Normal Saline. o May Shower, gently pat wound dry prior to applying new dressing. o No tub bath. Wound #7 Right Ischial Tuberosity o Clean wound with Normal Saline. o May Shower, gently pat wound dry prior to applying new dressing. o No tub bath. Anesthetic Wound #3 Midline Back o Topical Lidocaine 4% cream applied to wound bed prior to debridement Wound #4 Right Calcaneous o Topical Lidocaine 4% cream applied to wound bed prior to debridement Wound #7 Right Ischial Tuberosity o Topical Lidocaine 4% cream applied to wound bed prior to debridement Skin Barriers/Peri-Wound Care Wound #3 Midline Back o Barrier cream Wound #7 Right Ischial Tuberosity o Barrier cream Primary Wound Dressing Wound #3 Midline Back [place], Bexleigh H. ([id number]) o Santyl Ointment Wound #4 Right Calcaneous o Aquacel Ag Wound #7 Right Ischial Tuberosity o Santyl Ointment Secondary Dressing Wound #3 Midline Back o Boardered Foam Dressing Wound #4 Right Calcaneous o Boardered Foam Dressing Dressing Change Frequency Wound #3 Midline Back o Change dressing every day. Wound #4 Right Calcaneous o Change dressing every other day. Wound #7 Right Ischial Tuberosity o Change dressing every day. Follow-up Appointments Wound #3 Midline Back o Return Appointment in 1 week. Wound #4 Right Calcaneous o Return Appointment in 1 week. Off-Loading Wound #3 Midline Back o Heel suspension boot to: - Sage boots heels o Turn and reposition every 2  hours o Mattress - Will refer patient to DME vendor Wound #4 Right Calcaneous o Heel suspension boot to: - Sage boots heels o Turn and reposition every 2 hours o Mattress - will refer patient to DME vendor [place] #3 Ryan Nurse may visit PRN to address patientos wound care needs. Mcallister, Kaylyne H. ([id number]) o FACE TO FACE ENCOUNTER: MEDICARE and MEDICAID PATIENTS: I certify that this patient is under my care and that I had a face-to-face encounter that meets the physician face-to-face encounter requirements with this patient on this date. The encounter with the patient was in whole or in part for the following MEDICAL CONDITION: (primary reason for [place]) MEDICAL NECESSITY: I certify, that based on my findings, NURSING services are a medically necessary home health service. HOME BOUND STATUS: I certify that my clinical findings support that this patient is homebound (i.e., Due to illness or injury, pt requires aid of supportive devices such as crutches, cane, wheelchairs, walkers, the use of special transportation or the assistance of another person to leave their place of residence. There is a normal inability to leave the home and doing so requires considerable and taxing effort. Other absences are for medical reasons / religious services and are infrequent or of short duration when for other reasons). o If current dressing causes regression in wound condition, may D/C ordered dressing product/s and apply Normal Saline Moist Dressing daily until next [place] / Other MD appointment. [place] of regression in wound condition at [phone]. o Please direct any NON-WOUND related issues/requests for orders to patient's Primary Care Physician Wound #4 Right [place] Nurse may visit PRN to address patientos wound care  needs. o FACE TO FACE ENCOUNTER: MEDICARE and MEDICAID PATIENTS: I certify that this patient is under my care and that I had a face-to-face encounter that meets the physician face-to-face encounter requirements with this patient on this date. The  encounter with the patient was in whole or in part for the following MEDICAL CONDITION: (primary reason for [place]) MEDICAL NECESSITY: I certify, that based on my findings, NURSING services are a medically necessary home health service. HOME BOUND STATUS: I certify that my clinical findings support that this patient is homebound (i.e., Due to illness or injury, pt requires aid of supportive devices such as crutches, cane, wheelchairs, walkers, the use of special transportation or the assistance of another person to leave their place of residence. There is a normal inability to leave the home and doing so requires considerable and taxing effort. Other absences are for medical reasons / religious services and are infrequent or of short duration when for other reasons). o If current dressing causes regression in wound condition, may D/C ordered dressing product/s and apply Normal Saline Moist Dressing daily until next [place] / Other MD appointment. [place] of regression in wound condition at [phone]. o Please direct any NON-WOUND related issues/requests for orders to patient's Primary Care Physician Electronic Signature(s) Signed: 08/05/2015 3:39:21 PM By: Regan Lemming BSN, RN Signed: 08/05/2015 4:47:37 PM By: Christin Fudge MD, FACS Previous Signature: 08/05/2015 3:20:24 PM Version By: Regan Lemming BSN, RN Entered By: Regan Lemming on 08/05/2015 15:39:20 [name], [name] ([id number]) -------------------------------------------------------------------------------- Problem List Details Patient Name: Mckenzie Dross H. 08/05/2015 2:45 Date of Service: PM Medical Record [id number] Number: Patient Account  Number: [id number] 08-13-1946 (69 y.o. Treating RN: Afful, RN, BSN, Velva Harman Date of Birth/Sex: Female) Other Clinician: Primary Care Physician: [name], MIRIAM Treating Haley Roza Referring Physician: Paulita Cradle Physician/Extender: Suella Grove in Treatment: 20 Active Problems ICD-10 Encounter Code Description Active Date Diagnosis E11.621 Type 2 diabetes mellitus with foot ulcer 03/13/2015 Yes L89.613 Pressure ulcer of right heel, stage 3 03/13/2015 Yes F17.218 Nicotine dependence, cigarettes, with other nicotine- 03/13/2015 Yes induced disorders L89.100 Pressure ulcer of unspecified part of back, unstageable 05/02/2015 Yes L89.213 Pressure ulcer of right hip, stage 3 08/05/2015 Yes Inactive Problems Resolved Problems ICD-10 Code Description Active Date Resolved Date L89.013 Pressure ulcer of right elbow, stage 3 03/13/2015 03/13/2015 [id number] Pressure ulcer of left hip, stage 2 05/30/2015 05/30/2015 L89.153 Pressure ulcer of sacral region, stage 3 05/30/2015 05/30/2015 Bartoszek, Veleria H. ([id number]) Electronic Signature(s) Signed: 08/05/2015 4:28:15 PM By: Christin Fudge MD, FACS Entered By: Christin Fudge on 08/05/2015 16:28:15 [name], Mckenzie [place]. ([id number]) -------------------------------------------------------------------------------- Progress Note Details Patient Name: Mckenzie Dross H. 08/05/2015 2:45 Date of Service: PM Medical Record [id number] Number: Patient Account Number: [id number] May 02, 1946 (69 y.o. Treating RN: Baruch Gouty, RN, BSN, Velva Harman Date of Birth/Sex: Female) Other Clinician: Primary Care Physician: [name], MIRIAM Treating Breylen Agyeman Referring Physician: Paulita Cradle Physician/Extender: Suella Grove in Treatment: 20 Subjective Chief Complaint Information obtained from Patient Patient presents to the wound care center for a consult due non healing wound. Ulcers on the right elbow and the right heel for about 1 month. History of Present Illness (HPI) The  following HPI elements were documented for the patient's wound: Location: Ulceration on the right heel and the right elbow. Quality: Patient reports experiencing a dull pain to affected area(s). Severity: Patient states wound (s) are getting better. Duration: Patient has had the wound for < 4 weeks prior to presenting for treatment Timing: Pain in wound is Intermittent (comes and goes Context: The wound appeared gradually over time Modifying Factors: Consults to this date include:Augmentin and Bactrim and also some heel protection with duoderm Associated Signs and Symptoms: Patient reports having difficulty standing for long periods. 69 year old female  with history of peripheral neuropathy, history of diet controlled diabetes mellitus type 2, history of alcoholism here for wound consult sent by her PCP Dr. Sherilyn Cooter. She has pressure ulcers at her right elbow, bilateral heels. Plain films of right calcaneus without acute bony process. Patient started by PCP on Augmentin, Bactrim as per orders, DuoDerm dressings applied - reports some improvement in her ulcer since last seen. Denies fever, chills, nausea, vomiting, diarrhea. She had a right humerus fracture in the middle of May and has had no surgery and arm is in a sling. She is also been laying in the bed for quite a while. Past medical history significant for essential hypertension, osteoporosis, peripheral neuropathy, alcoholism, ataxia, personal history of breast cancer treated with surgery chemotherapy and radiation and this was done in December 2010. she is also status post laparoscopic cholecystectomy, pilonidal cyst excision, subcutaneous port placement, partial mastectomy on the left side, skin cancer removal. 03/21/2015 -- she says overall she's been doing better and continues to smoke about 15 cigarettes a day. 03/21/2015 - her orthopedic doctor has said she may require surgery for her right humerus fracture. 04/04/2015 -- her  orthopedic surgery has been scheduled for August 11. 04/18/2015 -- she is doing fine as far as her elbow and her right heel goes but she has developed some Sasaki, Vern H. ([id number]) redness over prominence on her thoracic spine and wanted me to take a look at this. 05/02/2015 -- she had her surgery done and now is in a sling and support. Her back has developed a pressure injury of undetermined stage. She seems to be in better spirits. 05/16/2015 -- last week her right heel was looking great and we had healed it out, but she has not been offloading appropriately and has a deep tissue injury on the right heel again. The area on her right elbow has opened out with slough and the area in the thoracic spine is also getting worse. 05/30/2015 -- she has developed 2 new ulcerations one on her left ischial tuberosity and one on the sacral region. She is still working on getting up smoking but is also unable to take her vitamins as she says she develops a diarrhea when she takes vitamins. She has increased her intake of proteins. 06/06/2015 -- the patient's husband manages to get her a low air loss mattress with initiating pressure but did not know how to use it exactly and the patient was not happy about using it. Overall she says she's been feeling better. 07/11/2015 -- . Discussed a surgical opinion for debridement and application of a wound VAC, 2 weeks ago but the patient has been reluctant to get a surgical opinion as she wants to avoid surgery. 07/18/2015 -- they have an appointment to see Dr. Tamala Julian this coming Wednesday. 07/29/2015 -- they saw Dr. Tamala Julian in his office and he did a debridement of the wound and removed significant amount of slough. This is in addition to the debridement I had done previously on Friday where a large amount of the eschar was removed. He did not recommend the application of wound VAC. Addendum : Dr. Thompson Caul note was reviewed via EPIC and details noted as  above. 08/05/2015 -- over the last week she has developed a another pressure injury to her right hip and has had significant discoloration of the skin and an eschar there. Objective Constitutional Pulse regular. Respirations normal and unlabored. Afebrile. Vitals Time Taken: 3:04 PM, Height: 65 in, Weight: 118 lbs, BMI: 19.6, Temperature:  98 F, Pulse: 92 bpm, Respiratory Rate: 17 breaths/min, Blood Pressure: 112/77 mmHg. Eyes Nonicteric. Reactive to light. Ears, Nose, Mouth, and Throat Lips, teeth, and gums WNL.Marland Kitchen Moist mucosa without lesions . Neck supple and nontender. No palpable supraclavicular or cervical adenopathy. Normal sized without goiter. Respiratory WNL. No retractions.. Cardiovascular [place], Jacqulene H. ([id number]) Pedal Pulses WNL. No clubbing, cyanosis or edema. Lymphatic No adneopathy. No adenopathy. No adenopathy. Musculoskeletal Adexa without tenderness or enlargement.. Digits and nails w/o clubbing, cyanosis, infection, petechiae, ischemia, or inflammatory conditions.Marland Kitchen Psychiatric Judgement and insight Intact.. No evidence of depression, anxiety, or agitation.. General Notes: the right heel is looking clean and no debridement was required today. The right elbow has healed. The thoracic spine the venous ulcer has minimal slough and more granulation tissue. There is a new stage III pressure ulcer and injury to the right hip and this has significant deep tissue injury surrounding this. Integumentary (Hair, Skin) No suspicious lesions. No crepitus or fluctuance. No peri-wound warmth or erythema. No masses.. Wound #3 status is Open. Original cause of wound was Pressure Injury. The wound is located on the Midline Back. The wound measures 2cm length x 3cm width x 0.2cm depth; 4.712cm^2 area and 0.942cm^3 volume. The wound is limited to skin breakdown. There is no tunneling or undermining noted. There is a medium amount of serosanguineous drainage noted. The wound margin  is distinct with the outline attached to the wound base. There is medium (34-66%) granulation within the wound bed. There is a medium (34-66%) amount of necrotic tissue within the wound bed including Adherent Slough. The periwound skin appearance exhibited: Moist. The periwound skin appearance did not exhibit: Callus, Crepitus, Excoriation, Fluctuance, Friable, Induration, Localized Edema, Rash, Scarring, Dry/Scaly, Maceration, Atrophie Blanche, Cyanosis, Ecchymosis, Hemosiderin Staining, Mottled, Pallor, Rubor, Erythema. Periwound temperature was noted as No Abnormality. The periwound has tenderness on palpation. Wound #4 status is Open. Original cause of wound was Pressure Injury. The wound is located on the Right Calcaneous. The wound measures 0.5cm length x 0.5cm width x 0.1cm depth; 0.196cm^2 area and 0.02cm^3 volume. The wound is limited to skin breakdown. There is no tunneling or undermining noted. There is a medium amount of serosanguineous drainage noted. The wound margin is distinct with the outline attached to the wound base. There is large (67-100%) pink granulation within the wound bed. There is no necrotic tissue within the wound bed. The periwound skin appearance exhibited: Moist. The periwound skin appearance did not exhibit: Callus, Crepitus, Excoriation, Fluctuance, Friable, Induration, Localized Edema, Rash, Scarring, Dry/Scaly, Maceration, Atrophie Blanche, Cyanosis, Ecchymosis, Hemosiderin Staining, Mottled, Pallor, Rubor, Erythema. Periwound temperature was noted as No Abnormality. The periwound has tenderness on palpation. Wound #7 status is Open. Original cause of wound was Pressure Injury. The wound is located on the Right Ischial Tuberosity. The wound measures 1cm length x 4cm width x 0.1cm depth; 3.142cm^2 area and 0.314cm^3 volume. The wound is limited to skin breakdown. There is no tunneling or undermining noted. There is a medium amount of serosanguineous drainage  noted. The wound margin is distinct with the outline attached to the wound base. There is small (1-33%) granulation within the wound bed. There is a large (67-100%) amount of necrotic tissue within the wound bed including Eschar and Adherent Slough. The periwound skin appearance exhibited: Moist. The periwound skin appearance did not exhibit: Callus, Kloss, Ramsha H. ([id number]) Crepitus, Excoriation, Fluctuance, Friable, Induration, Localized Edema, Rash, Scarring, Dry/Scaly, Maceration, Atrophie Blanche, Cyanosis, Ecchymosis, Hemosiderin Staining, Mottled, Pallor, Rubor, Erythema. Periwound temperature  was noted as No Abnormality. 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 She has been reluctant to stop smoking and continues to smoke in spite of every good effort on discussing with her details of the benefits of smoking cessation. At this stage I believe she is in dire need of proper offloading with a low air loss mattress to prevent pressure injury. I am going to strongly recommend this and will send over and out to see what is available via her insurance company. local dressing changes have been elaborated and the patient and her husband are agreeable about going ahead with the mattress requisition. Plan Wound Cleansing: Wound #3 Midline Back: Clean wound with Normal Saline. May Shower, gently pat wound dry prior to applying new dressing. No tub bath. Wound #4 Right Calcaneous: Clean wound with Normal Saline. May Shower, gently pat wound dry  prior to applying new dressing. No tub bath. Wound #7 Right Ischial Tuberosity: Clean wound with Normal Saline. May Shower, gently pat wound dry prior to applying new dressing. [name], [name] ([id number]) No tub bath. Anesthetic: Wound #3 Midline Back: Topical Lidocaine 4% cream applied to wound bed prior to debridement Wound #4 Right Calcaneous: Topical Lidocaine 4% cream applied to wound bed prior to debridement Wound #7 Right Ischial Tuberosity: Topical Lidocaine 4% cream applied to wound bed prior to debridement Skin Barriers/Peri-Wound Care: Wound #3 Midline Back: Barrier cream Wound #7 Right Ischial Tuberosity: Barrier cream Primary Wound Dressing: Wound #3 Midline Back: Santyl Ointment Wound #4 Right Calcaneous: Aquacel Ag Wound #7 Right Ischial Tuberosity: Santyl Ointment Secondary Dressing: Wound #3 Midline Back: Boardered Foam Dressing Wound #4 Right Calcaneous: Boardered Foam Dressing Dressing Change Frequency: Wound #3 Midline Back: Change dressing every day. Wound #4 Right Calcaneous: Change dressing every other day. Wound #7 Right Ischial Tuberosity: Change dressing every day. Follow-up Appointments: Wound #3 Midline Back: Return Appointment in 1 week. Wound #4 Right Calcaneous: Return Appointment in 1 week. Off-Loading: Wound #3 Midline Back: Heel suspension boot to: - Sage boots heels Turn and reposition every 2 hours Mattress - Will refer patient to DME vendor Wound #4 Right Calcaneous: Heel suspension boot to: - Sage boots heels Turn and reposition every 2 hours Mattress - will refer patient to DME vendor Home Health: Wound #3 Midline Back: [place] Nurse may visit PRN to address patient s wound care needs. FACE TO FACE ENCOUNTER: MEDICARE and MEDICAID PATIENTS: I certify that this patient is under [name], [name] ([id number]) my care and that I had a face-to-face encounter that meets the physician face-to-face  encounter requirements with this patient on this date. The encounter with the patient was in whole or in part for the following MEDICAL CONDITION: (primary reason for [place]) MEDICAL NECESSITY: I certify, that based on my findings, NURSING services are a medically necessary home health service. HOME BOUND STATUS: I certify that my clinical findings support that this patient is homebound (i.e., Due to illness or injury, pt requires aid of supportive devices such as crutches, cane, wheelchairs, walkers, the use of special transportation or the  assistance of another person to leave their place of residence. There is a normal inability to leave the home and doing so requires considerable and taxing effort. Other absences are for medical reasons / religious services and are infrequent or of short duration when for other reasons). If current dressing causes regression in wound condition, may D/C ordered dressing product/s and apply Normal Saline Moist Dressing daily until next [name] / Other MD appointment. [place] of regression in wound condition at [phone]. Please direct any NON-WOUND related issues/requests for orders to patient's Primary Care Physician Wound #4 Right Calcaneous: [place] Nurse may visit PRN to address patient s wound care needs. FACE TO FACE ENCOUNTER: MEDICARE and MEDICAID PATIENTS: I certify that this patient is under my care and that I had a face-to-face encounter that meets the physician face-to-face encounter requirements with this patient on this date. The encounter with the patient was in whole or in part for the following MEDICAL CONDITION: (primary reason for [place]) MEDICAL NECESSITY: I certify, that based on my findings, NURSING services are a medically necessary home health service. HOME BOUND STATUS: I certify that my clinical findings support that this patient is homebound (i.e.,  Due to illness or injury, pt requires aid of supportive devices such as crutches, cane, wheelchairs, walkers, the use of special transportation or the assistance of another person to leave their place of residence. There is a normal inability to leave the home and doing so requires considerable and taxing effort. Other absences are for medical reasons / religious services and are infrequent or of short duration when for other reasons). If current dressing causes regression in wound condition, may D/C ordered dressing product/s and apply Normal Saline Moist Dressing daily until next [place] / Other MD appointment. [place] of regression in wound condition at [phone]. Please direct any NON-WOUND related issues/requests for orders to patient's Primary Care Physician Over the last 4-1/2 months this patient has had multiple pressure injuries to various parts of her body including heels, hips, sacrum and thoracic spine region. She has been reluctant to stop smoking and continues to smoke in spite of every good effort on discussing with her details of the benefits of smoking cessation. At this stage I believe she is in dire need of proper offloading with a low air loss mattress to prevent pressure injury. I am going to strongly recommend this and will send over and out to see what is available via her insurance company. local dressing changes have been elaborated and the patient and her husband are agreeable about going ahead with the mattress requisition. [name], [name] H. ([id number]) Electronic Signature(s) Signed: 08/05/2015 4:32:17 PM By: Christin Fudge MD, FACS Entered By: Christin Fudge on 08/05/2015 16:32:17 Stevison, Iysha H. ([id number]) -------------------------------------------------------------------------------- SuperBill Details Patient Name: [name], Ivonne H. Date of Service: 08/05/2015 Medical Record Patient Account Number: [id number] Number:  Afful, RN, BSN, Treating RN: 1946-03-01 (69 y.o. 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, stage 3 Facility Procedures CPT4 Code: [id number] Description: 99214 - WOUND CARE VISIT-LEV 4 EST PT Modifier: Quantity:  1 Physician Procedures CPT4 Code Description: [id number] - WC PHYS LEVEL 3 - EST PT ICD-10 Description Diagnosis 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

## 2015-08-14 ENCOUNTER — Inpatient Hospital Stay: Payer: Medicare Other | Attending: Oncology | Admitting: Oncology

## 2015-08-15 ENCOUNTER — Encounter: Payer: Medicare Other | Attending: Surgery | Admitting: Surgery

## 2015-08-15 DIAGNOSIS — L89613 Pressure ulcer of right heel, stage 3: Secondary | ICD-10-CM | POA: Diagnosis not present

## 2015-08-15 DIAGNOSIS — L89213 Pressure ulcer of right hip, stage 3: Secondary | ICD-10-CM | POA: Diagnosis not present

## 2015-08-15 DIAGNOSIS — F17218 Nicotine dependence, cigarettes, with other nicotine-induced disorders: Secondary | ICD-10-CM | POA: Insufficient documentation

## 2015-08-15 DIAGNOSIS — G629 Polyneuropathy, unspecified: Secondary | ICD-10-CM | POA: Insufficient documentation

## 2015-08-15 DIAGNOSIS — F1021 Alcohol dependence, in remission: Secondary | ICD-10-CM | POA: Insufficient documentation

## 2015-08-15 DIAGNOSIS — L89100 Pressure ulcer of unspecified part of back, unstageable: Secondary | ICD-10-CM | POA: Insufficient documentation

## 2015-08-15 DIAGNOSIS — Z853 Personal history of malignant neoplasm of breast: Secondary | ICD-10-CM | POA: Diagnosis not present

## 2015-08-15 DIAGNOSIS — M1990 Unspecified osteoarthritis, unspecified site: Secondary | ICD-10-CM | POA: Insufficient documentation

## 2015-08-15 DIAGNOSIS — I10 Essential (primary) hypertension: Secondary | ICD-10-CM | POA: Insufficient documentation

## 2015-08-15 DIAGNOSIS — E11621 Type 2 diabetes mellitus with foot ulcer: Secondary | ICD-10-CM | POA: Insufficient documentation

## 2015-08-16 NOTE — Progress Notes (Signed)
Mckenzie, [name] ([id number]) Visit Report for 08/15/2015 Chief Complaint Document Details Patient Name: [name], [name] Date of Service: 08/15/2015 2:15 PM Medical Record Number: [id number] Patient Account Number: [id number] Date of Birth/Sex: 01-Jan-1946 (69 y.o. Female) Treating RN: Montey Hora Primary Care Physician: Paulita Cradle Other Clinician: Referring Physician: Paulita Cradle Treating Physician/Extender: Frann Rider in Treatment: 22 Information Obtained from: Patient Chief Complaint Patient presents to the wound care center for a consult due non healing wound. Ulcers on the right elbow and the right heel for about 1 month. Electronic Signature(s) Signed: 08/15/2015 3:12:29 PM By: Christin Fudge MD, FACS Entered By: Christin Fudge on 08/15/2015 15:12:29 [name], [name] ([id number]) -------------------------------------------------------------------------------- Debridement Details Patient Name: [name], [name] Date of Service: 08/15/2015 2:15 PM Medical Record Number: [id number] Patient Account Number: [id number] Date of Birth/Sex: November 28, 1945 (69 y.o. Female) Treating RN: Montey Hora Primary Care Physician: Paulita Cradle Other Clinician: Referring Physician: Paulita Cradle Treating Physician/Extender: Frann Rider in Treatment: 22 Debridement Performed for Wound #3 Midline Back Assessment: Performed By: Physician Christin Fudge, MD Debridement: Debridement Pre-procedure Yes Verification/Time Out Taken: Start Time: 15:00 Pain Control: Lidocaine 4% Topical Solution Level: Skin/Subcutaneous Tissue Total Area Debrided (L x 1.7 (cm) x 0.9 (cm) = 1.53 (cm) W): Tissue and other Viable, Non-Viable, Eschar, Fibrin/Slough, Subcutaneous material debrided: Instrument: Curette Bleeding: Minimum Hemostasis Achieved: Pressure End Time: 15:01 Procedural Pain: 0 Post Procedural Pain: 0 Response to Treatment: Procedure was tolerated well Post  Debridement Measurements of Total Wound Length: (cm) 1.7 Stage: Category/Stage II Width: (cm) 0.9 Depth: (cm) 0.2 Volume: (cm) 0.24 Post Procedure Diagnosis Same as Pre-procedure Electronic Signature(s) Signed: 08/15/2015 3:12:12 PM By: Christin Fudge MD, FACS Signed: 08/15/2015 5:50:47 PM By: Montey Hora Entered By: Christin Fudge on 08/15/2015 15:12:12 [name], [name] ([id number]) -------------------------------------------------------------------------------- Debridement Details Patient Name: [name], [name] Date of Service: 08/15/2015 2:15 PM Medical Record Number: [id number] Patient Account Number: [id number] Date of Birth/Sex: 1945/12/24 (69 y.o. Female) Treating RN: Montey Hora Primary Care Physician: Paulita Cradle Other Clinician: Referring Physician: Paulita Cradle Treating Physician/Extender: Frann Rider in Treatment: 22 Debridement Performed for Wound #7 Right Ischial Tuberosity Assessment: Performed By: Physician Christin Fudge, MD Debridement: Debridement Pre-procedure Yes Verification/Time Out Taken: Start Time: 14:57 Pain Control: Lidocaine 4% Topical Solution Level: Skin/Subcutaneous Tissue Total Area Debrided (L x 0.8 (cm) x 2.4 (cm) = 1.92 (cm) W): Tissue and other Viable, Non-Viable, Eschar, Fibrin/Slough, Subcutaneous material debrided: Instrument: Curette Bleeding: Minimum Hemostasis Achieved: Pressure End Time: 15:00 Procedural Pain: 0 Post Procedural Pain: 0 Response to Treatment: Procedure was tolerated well Post Debridement Measurements of Total Wound Length: (cm) 0.8 Stage: Category/Stage III Width: (cm) 2.4 Depth: (cm) 0.2 Volume: (cm) 0.302 Post Procedure Diagnosis Same as Pre-procedure Electronic Signature(s) Signed: 08/15/2015 3:12:22 PM By: Christin Fudge MD, FACS Signed: 08/15/2015 5:50:47 PM By: Montey Hora Entered By: Christin Fudge on 08/15/2015 15:12:22 [name], [name]  ([id number]) -------------------------------------------------------------------------------- HPI Details Patient Name: [name], [name] Date of Service: 08/15/2015 2:15 PM Medical Record Number: [id number] Patient Account Number: [id number] Date of Birth/Sex: 26-Dec-1945 (69 y.o. Female) Treating RN: Montey Hora Primary Care Physician: Paulita Cradle Other Clinician: Referring Physician: Paulita Cradle Treating Physician/Extender: Frann Rider in Treatment: 22 History of Present Illness Location: Ulceration on the right heel and the right elbow. Quality: Patient reports experiencing a dull pain to affected area(s). Severity: Patient states wound (s) are getting better. Duration: Patient has had the wound for < 4 weeks prior to presenting for treatment Timing: Pain in wound  is Intermittent (comes and goes Context: The wound appeared gradually over time Modifying Factors: Consults to this date include:Augmentin and Bactrim and also some heel protection with duoderm Associated Signs and Symptoms: Patient reports having difficulty standing for long periods. HPI Description: 69 year old female with history of peripheral neuropathy, history of diet controlled diabetes mellitus type 2, history of alcoholism here for wound consult sent by her PCP Dr. Sherilyn Cooter. She has pressure ulcers at her right elbow, bilateral heels. Plain films of right calcaneus without acute bony process. Patient started by PCP on Augmentin, Bactrim as per orders, DuoDerm dressings applied - reports some improvement in her ulcer since last seen. Denies fever, chills, nausea, vomiting, diarrhea. She had a right humerus fracture in the middle of May and has had no surgery and arm is in a sling. She is also been laying in the bed for quite a while. Past medical history significant for essential hypertension, osteoporosis, peripheral neuropathy, alcoholism, ataxia, personal history of breast cancer treated with  surgery chemotherapy and radiation and this was done in December 2010. she is also status post laparoscopic cholecystectomy, pilonidal cyst excision, subcutaneous port placement, partial mastectomy on the left side, skin cancer removal. 03/21/2015 -- she says overall she's been doing better and continues to smoke about 15 cigarettes a day. 03/21/2015 - her orthopedic doctor has said she may require surgery for her right humerus fracture. 04/04/2015 -- her orthopedic surgery has been scheduled for August 11. 04/18/2015 -- she is doing fine as far as her elbow and her right heel goes but she has developed some redness over prominence on her thoracic spine and wanted me to take a look at this. 05/02/2015 -- she had her surgery done and now is in a sling and support. Her back has developed a pressure injury of undetermined stage. She seems to be in better spirits. 05/16/2015 -- last week her right heel was looking great and we had healed it out, but she has not been offloading appropriately and has a deep tissue injury on the right heel again. The area on her right elbow has opened out with slough and the area in the thoracic spine is also getting worse. 05/30/2015 -- she has developed 2 new ulcerations one on her left ischial tuberosity and one on the sacral region. She is still working on getting up smoking but is also unable to take her vitamins as she says she develops a diarrhea when she takes vitamins. She has increased her intake of proteins. 06/06/2015 -- the patient's husband manages to get her a low air loss mattress with initiating pressure but Reister, Amauria [name]. ([id number]) did not know how to use it exactly and the patient was not happy about using it. Overall she says she's been feeling better. 07/11/2015 -- . Discussed a surgical opinion for debridement and application of a wound VAC, 2 weeks ago but the patient has been reluctant to get a surgical opinion as she wants to avoid  surgery. 07/18/2015 -- they have an appointment to see Dr. Tamala Julian this coming Wednesday. 07/29/2015 -- they saw Dr. Tamala Julian in his office and he did a debridement of the wound and removed significant amount of slough. This is in addition to the debridement I had done previously on Friday where a large amount of the eschar was removed. He did not recommend the application of wound VAC. Addendum : Dr. Thompson Caul note was reviewed via EPIC and details noted as above. 08/05/2015 -- over the last week she has  developed a another pressure injury to her right hip and has had significant discoloration of the skin and an eschar there. 08/15/2015 -- she is still smoking about a pack of cigarettes a day and does not seem to want to quit. They have not been able to talk to the vendor regarding the air mattress and I will ask them to get in touch again. She is reluctant to take vitamins and does admit her nutrition is poor. Electronic Signature(s) Signed: 08/15/2015 3:13:40 PM By: Christin Fudge MD, FACS Entered By: Christin Fudge on 08/15/2015 15:13:40 [name], [name] -------------------------------------------------------------------------------- Physical Exam Details Patient Name: [name], [name] Date of Service: 08/15/2015 2:15 PM Medical Record Number: [id number] Patient Account Number: [id number] Date of Birth/Sex: 07-Sep-1946 (69 y.o. Female) Treating RN: Montey Hora Primary Care Physician: Paulita Cradle Other Clinician: Referring Physician: Paulita Cradle Treating Physician/Extender: Frann Rider in Treatment: 22 Constitutional . Pulse regular. Respirations normal and unlabored. Afebrile. . Eyes Nonicteric. Reactive to light. Ears, Nose, Mouth, and Throat Lips, teeth, and gums WNL.Marland Kitchen Moist mucosa without lesions . Neck supple and nontender. No palpable supraclavicular or cervical adenopathy. Normal sized without goiter. Respiratory WNL. No  retractions.. Cardiovascular Pedal Pulses WNL. No clubbing, cyanosis or edema. Lymphatic No adneopathy. No adenopathy. No adenopathy. Musculoskeletal Adexa without tenderness or enlargement.. Digits and nails w/o clubbing, cyanosis, infection, petechiae, ischemia, or inflammatory conditions.. Integumentary (Hair, Skin) No suspicious lesions. No crepitus or fluctuance. No peri-wound warmth or erythema. No masses.Marland Kitchen Psychiatric Judgement and insight Intact.. No evidence of depression, anxiety, or agitation.. Notes the right heel is now clean and healed. The thoracic spine has good granulation tissue and there is minimal slough which was debrided with moist saline. The pressure ulcer on the right hip need sharp debridement with a curette Electronic Signature(s) Signed: 08/15/2015 3:14:26 PM By: Christin Fudge MD, FACS Entered By: Christin Fudge on 08/15/2015 15:14:26 [name], [name] -------------------------------------------------------------------------------- Physician Orders Details Patient Name: [name], [name] Date of Service: 08/15/2015 2:15 PM Medical Record Number: [id number] Patient Account Number: [id number] Date of Birth/Sex: 1946/07/11 (69 y.o. Female) Treating RN: Montey Hora Primary Care Physician: Paulita Cradle Other Clinician: Referring Physician: Paulita Cradle Treating Physician/Extender: Frann Rider in Treatment: 81 Verbal / Phone Orders: Yes Clinician: Montey Hora Read Back and Verified: Yes Diagnosis Coding Wound Cleansing Wound #3 Midline Back o Clean wound with Normal Saline. o May Shower, gently pat wound dry prior to applying new dressing. o No tub bath. Wound #7 Right Ischial Tuberosity o Clean wound with Normal Saline. o May Shower, gently pat wound dry prior to applying new dressing. o No tub bath. Anesthetic Wound #3 Midline Back o Topical Lidocaine 4% cream applied to wound bed prior to  debridement Wound #7 Right Ischial Tuberosity o Topical Lidocaine 4% cream applied to wound bed prior to debridement Skin Barriers/Peri-Wound Care Wound #3 Midline Back o Barrier cream Wound #7 Right Ischial Tuberosity o Barrier cream Primary Wound Dressing Wound #7 Right Ischial Tuberosity o Santyl Ointment Wound #3 Midline Back o Other: - sobact and hydrogel Secondary Dressing Wound #3 Midline Back o Boardered Foam Dressing Bang, Kamryn [name]. ([id number]) Wound #7 Right Ischial Tuberosity o Boardered Foam Dressing Dressing Change Frequency Wound #3 Midline Back o Change dressing every day. Wound #7 Right Ischial Tuberosity o Change dressing every day. Follow-up Appointments Wound #3 Midline Back o Return Appointment in 1 week. Wound #7 Right Ischial Tuberosity o Return Appointment in 1 week. Off-Loading Wound #3 Midline Back o Heel  suspension boot to: - Sage boots heels o Turn and reposition every 2 hours o Mattress - Will refer patient to DME vendor Wound #7 Right Ischial Tuberosity o Heel suspension boot to: - Sage boots heels o Turn and reposition every 2 hours o Mattress - Will refer patient to DME vendor [employer] #3 Midline Back o [place] Visits - [place] Nurse may visit PRN to address patientos wound care needs. o FACE TO FACE ENCOUNTER: MEDICARE and MEDICAID PATIENTS: I certify that this patient is under my care and that I had a face-to-face encounter that meets the physician face-to-face encounter requirements with this patient on this date. The encounter with the patient was in whole or in part for the following MEDICAL CONDITION: (primary reason for [place]) MEDICAL NECESSITY: I certify, that based on my findings, NURSING services are a medically necessary home health service. HOME BOUND STATUS: I certify that my clinical findings support that this patient is homebound (i.e.,  Due to illness or injury, pt requires aid of supportive devices such as crutches, cane, wheelchairs, walkers, the use of special transportation or the assistance of another person to leave their place of residence. There is a normal inability to leave the home and doing so requires considerable and taxing effort. Other absences are for medical reasons / religious services and are infrequent or of short duration when for other reasons). o If current dressing causes regression in wound condition, may D/C ordered dressing product/s and apply Normal Saline Moist Dressing daily until next [place] / Other MD appointment. [place] of regression in wound condition at [phone]. [name], [name] o Please direct any NON-WOUND related issues/requests for orders to patient's Primary Care Physician Wound #7 Right Ischial [place] Visits - [place] Nurse may visit PRN to address patientos wound care needs. o FACE TO FACE ENCOUNTER: MEDICARE and MEDICAID PATIENTS: I certify that this patient is under my care and that I had a face-to-face encounter that meets the physician face-to-face encounter requirements with this patient on this date. The encounter with the patient was in whole or in part for the following MEDICAL CONDITION: (primary reason for [place]) MEDICAL NECESSITY: I certify, that based on my findings, NURSING services are a medically necessary home health service. HOME BOUND STATUS: I certify that my clinical findings support that this patient is homebound (i.e., Due to illness or injury, pt requires aid of supportive devices such as crutches, cane, wheelchairs, walkers, the use of special transportation or the assistance of another person to leave their place of residence. There is a normal inability to leave the home and doing so requires considerable and taxing effort. Other absences are for  medical reasons / religious services and are infrequent or of short duration when for other reasons). o If current dressing causes regression in wound condition, may D/C ordered dressing product/s and apply Normal Saline Moist Dressing daily until next [place] / Other MD appointment. [place] of regression in wound condition at [phone]. o Please direct any NON-WOUND related issues/requests for orders to patient's Primary Care Physician Electronic Signature(s) Signed: 08/15/2015 4:40:44 PM By: Christin Fudge MD, FACS Signed: 08/15/2015 5:50:47 PM By: Montey Hora Entered By: Montey Hora on 08/15/2015 15:02:00 Buysse, Kaidan [name]. ([id number]) -------------------------------------------------------------------------------- Problem List Details Patient Name: Shippy, Gyneth [name]. Date of Service: 08/15/2015 2:15 PM Medical Record Number: [id number] Patient Account Number: [id number]  Date of Birth/Sex: 1946-03-07 (69 y.o. Female) Treating RN: Montey Hora Primary Care Physician: Paulita Cradle Other Clinician: Referring Physician: Paulita Cradle Treating Physician/Extender: Frann Rider in Treatment: 22 Active Problems ICD-10 Encounter Code Description Active Date Diagnosis E11.621 Type 2 diabetes mellitus with foot ulcer 03/13/2015 Yes L89.613 Pressure ulcer of right heel, stage 3 03/13/2015 Yes F17.218 Nicotine dependence, cigarettes, with other nicotine- 03/13/2015 Yes induced disorders L89.100 Pressure ulcer of unspecified part of back, unstageable 05/02/2015 Yes L89.213 Pressure ulcer of right hip, stage 3 08/05/2015 Yes Inactive Problems Resolved Problems ICD-10 Code Description Active Date Resolved Date L89.013 Pressure ulcer of right elbow, stage 3 03/13/2015 03/13/2015 [id number] Pressure ulcer of left hip, stage 2 05/30/2015 05/30/2015 L89.153 Pressure ulcer of sacral region, stage 3 05/30/2015 05/30/2015 Electronic  Signature(s) [name], Tayloranne Lemmie Evens ([id number]) Signed: 08/15/2015 3:11:57 PM By: Christin Fudge MD, FACS Entered By: Christin Fudge on 08/15/2015 15:11:57 Hitson, Narcissus [name]. ([id number]) -------------------------------------------------------------------------------- Progress Note Details Patient Name: Halder, Ashrita [name]. Date of Service: 08/15/2015 2:15 PM Medical Record Number: [id number] Patient Account Number: [id number] Date of Birth/Sex: 1946/07/28 (69 y.o. Female) Treating RN: Montey Hora Primary Care Physician: Paulita Cradle Other Clinician: Referring Physician: Paulita Cradle Treating Physician/Extender: Frann Rider in Treatment: 22 Subjective Chief Complaint Information obtained from Patient Patient presents to the wound care center for a consult due non healing wound. Ulcers on the right elbow and the right heel for about 1 month. History of Present Illness (HPI) The following HPI elements were documented for the patient's wound: Location: Ulceration on the right heel and the right elbow. Quality: Patient reports experiencing a dull pain to affected area(s). Severity: Patient states wound (s) are getting better. Duration: Patient has had the wound for < 4 weeks prior to presenting for treatment Timing: Pain in wound is Intermittent (comes and goes Context: The wound appeared gradually over time Modifying Factors: Consults to this date include:Augmentin and Bactrim and also some heel protection with duoderm Associated Signs and Symptoms: Patient reports having difficulty standing for long periods. 69 year old female with history of peripheral neuropathy, history of diet controlled diabetes mellitus type 2, history of alcoholism here for wound consult sent by her PCP Dr. Sherilyn Cooter. She has pressure ulcers at her right elbow, bilateral heels. Plain films of right calcaneus without acute bony process. Patient started by PCP on Augmentin, Bactrim as per orders, DuoDerm dressings  applied - reports some improvement in her ulcer since last seen. Denies fever, chills, nausea, vomiting, diarrhea. She had a right humerus fracture in the middle of May and has had no surgery and arm is in a sling. She is also been laying in the bed for quite a while. Past medical history significant for essential hypertension, osteoporosis, peripheral neuropathy, alcoholism, ataxia, personal history of breast cancer treated with surgery chemotherapy and radiation and this was done in December 2010. she is also status post laparoscopic cholecystectomy, pilonidal cyst excision, subcutaneous port placement, partial mastectomy on the left side, skin cancer removal. 03/21/2015 -- she says overall she's been doing better and continues to smoke about 15 cigarettes a day. 03/21/2015 - her orthopedic doctor has said she may require surgery for her right humerus fracture. 04/04/2015 -- her orthopedic surgery has been scheduled for August 11. 04/18/2015 -- she is doing fine as far as her elbow and her right heel goes but she has developed some redness over prominence on her thoracic spine and wanted me to take a look at this. [name], [name] 05/02/2015 -- she  had her surgery done and now is in a sling and support. Her back has developed a pressure injury of undetermined stage. She seems to be in better spirits. 05/16/2015 -- last week her right heel was looking great and we had healed it out, but she has not been offloading appropriately and has a deep tissue injury on the right heel again. The area on her right elbow has opened out with slough and the area in the thoracic spine is also getting worse. 05/30/2015 -- she has developed 2 new ulcerations one on her left ischial tuberosity and one on the sacral region. She is still working on getting up smoking but is also unable to take her vitamins as she says she develops a diarrhea when she takes vitamins. She has increased her intake of  proteins. 06/06/2015 -- the patient's husband manages to get her a low air loss mattress with initiating pressure but did not know how to use it exactly and the patient was not happy about using it. Overall she says she's been feeling better. 07/11/2015 -- . Discussed a surgical opinion for debridement and application of a wound VAC, 2 weeks ago but the patient has been reluctant to get a surgical opinion as she wants to avoid surgery. 07/18/2015 -- they have an appointment to see Dr. Tamala Julian this coming Wednesday. 07/29/2015 -- they saw Dr. Tamala Julian in his office and he did a debridement of the wound and removed significant amount of slough. This is in addition to the debridement I had done previously on Friday where a large amount of the eschar was removed. He did not recommend the application of wound VAC. Addendum : Dr. Thompson Caul note was reviewed via EPIC and details noted as above. 08/05/2015 -- over the last week she has developed a another pressure injury to her right hip and has had significant discoloration of the skin and an eschar there. 08/15/2015 -- she is still smoking about a pack of cigarettes a day and does not seem to want to quit. They have not been able to talk to the vendor regarding the air mattress and I will ask them to get in touch again. She is reluctant to take vitamins and does admit her nutrition is poor. Objective Constitutional Pulse regular. Respirations normal and unlabored. Afebrile. Vitals Time Taken: 2:31 PM, Height: 65 in, Weight: 118 lbs, BMI: 19.6, Temperature: 98.1 F, Pulse: 101 bpm, Respiratory Rate: 18 breaths/min, Blood Pressure: 100/55 mmHg. Eyes Nonicteric. Reactive to light. Ears, Nose, Mouth, and Throat Lips, teeth, and gums WNL.Marland Kitchen Moist mucosa without lesions . Neck supple and nontender. No palpable supraclavicular or cervical adenopathy. Normal sized without goiter. Respiratory WNL. No retractions.Marland Kitchen Wamser, Arella [name].  ([id number]) Cardiovascular Pedal Pulses WNL. No clubbing, cyanosis or edema. Lymphatic No adneopathy. No adenopathy. No adenopathy. Musculoskeletal Adexa without tenderness or enlargement.. Digits and nails w/o clubbing, cyanosis, infection, petechiae, ischemia, or inflammatory conditions.Marland Kitchen Psychiatric Judgement and insight Intact.. No evidence of depression, anxiety, or agitation.. General Notes: the right heel is now clean and healed. The thoracic spine has good granulation tissue and there is minimal slough which was debrided with moist saline. The pressure ulcer on the right hip need sharp debridement with a curette Integumentary (Hair, Skin) No suspicious lesions. No crepitus or fluctuance. No peri-wound warmth or erythema. No masses.. Wound #3 status is Open. Original cause of wound was Pressure Injury. The wound is located on the Midline Back. The wound measures 1.7cm length x 0.9cm width x 0.2cm depth; 1.202cm^2  area and 0.24cm^3 volume. The wound is limited to skin breakdown. There is no tunneling or undermining noted. There is a medium amount of serosanguineous drainage noted. The wound margin is distinct with the outline attached to the wound base. There is large (67-100%) red, pink granulation within the wound bed. There is a small (1-33%) amount of necrotic tissue within the wound bed including Adherent Slough. The periwound skin appearance exhibited: Moist. The periwound skin appearance did not exhibit: Callus, Crepitus, Excoriation, Fluctuance, Friable, Induration, Localized Edema, Rash, Scarring, Dry/Scaly, Maceration, Atrophie Blanche, Cyanosis, Ecchymosis, Hemosiderin Staining, Mottled, Pallor, Rubor, Erythema. Periwound temperature was noted as No Abnormality. The periwound has tenderness on palpation. Wound #4 status is Healed - Epithelialized. Original cause of wound was Pressure Injury. The wound is located on the Right Calcaneous. The wound measures 0cm length x 0cm  width x 0cm depth; 0cm^2 area and 0cm^3 volume. The wound is limited to skin breakdown. There is no tunneling or undermining noted. There is a medium amount of serosanguineous drainage noted. The wound margin is distinct with the outline attached to the wound base. There is large (67-100%) pink granulation within the wound bed. There is no necrotic tissue within the wound bed. The periwound skin appearance exhibited: Moist. The periwound skin appearance did not exhibit: Callus, Crepitus, Excoriation, Fluctuance, Friable, Induration, Localized Edema, Rash, Scarring, Dry/Scaly, Maceration, Atrophie Blanche, Cyanosis, Ecchymosis, Hemosiderin Staining, Mottled, Pallor, Rubor, Erythema. Periwound temperature was noted as No Abnormality. The periwound has tenderness on palpation. Wound #7 status is Open. Original cause of wound was Pressure Injury. The wound is located on the Right Ischial Tuberosity. The wound measures 0.8cm length x 2.4cm width x 0.2cm depth; 1.508cm^2 area and 0.302cm^3 volume. The wound is limited to skin breakdown. There is no tunneling or undermining noted. There is a medium amount of serosanguineous drainage noted. The wound margin is distinct with the outline attached to the wound base. There is small (1-33%) granulation within the wound bed. There is a large (67-100%) amount of necrotic tissue within the wound bed including Eschar and Adherent Slough. [name], [name] The periwound skin appearance exhibited: Moist. The periwound skin appearance did not exhibit: Callus, Crepitus, Excoriation, Fluctuance, Friable, Induration, Localized Edema, Rash, Scarring, Dry/Scaly, Maceration, Atrophie Blanche, Cyanosis, Ecchymosis, Hemosiderin Staining, Mottled, Pallor, Rubor, Erythema. Periwound temperature was noted as No Abnormality. 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 The air mattress and they need to actively pursue it with the vendor. 2. Vitamin C, zinc and a multivitamin to be included in her diet in addition to high protein diet 3. Use Sorbact with hydrogel on the thoracic spine wound and cover it with a bordered foam 4. Use Santyl on the right hip 5. Continue to use heel protectors on both heels 6. Actively work on giving up smoking and we have discussed this at length including the various methodologies. I don't believe the patient is interested in giving up smoking. As noted above there are several detrimental factors to prevent the wound healing and the patient becomes rather upset when you bring these points up. Nonetheless I have discussed this today  in front of her husband who is always very silent as he probably knows the wife's temperament. Procedures Wound #3 Wound #3 is a Pressure Ulcer located on the Midline Back . There was a Skin/Subcutaneous Tissue Debridement [id number]) debridement with total area of 1.53 sq cm performed by Christin Fudge, MD. with the following instrument(s): Curette to remove Viable and Non-Viable tissue/material including Fibrin/Slough, Eschar, and Subcutaneous after achieving pain control using Lidocaine 4% Topical Solution. A time out was conducted prior to the start of the procedure. A Minimum amount of bleeding was controlled with Pressure. The procedure was tolerated well with a pain level of 0 throughout and a pain level of 0 following the procedure. Post Debridement Measurements: 1.7cm length x 0.9cm width x 0.2cm depth; [name], [name] 0.24cm^3 volume. Post debridement Stage noted as Category/Stage  II. Post procedure Diagnosis Wound #3: Same as Pre-Procedure Wound #7 Wound #7 is a Pressure Ulcer located on the Right Ischial Tuberosity . There was a Skin/Subcutaneous Tissue Debridement [id number]) debridement with total area of 1.92 sq cm performed by Christin Fudge, MD. with the following instrument(s): Curette to remove Viable and Non-Viable tissue/material including Fibrin/Slough, Eschar, and Subcutaneous after achieving pain control using Lidocaine 4% Topical Solution. A time out was conducted prior to the start of the procedure. A Minimum amount of bleeding was controlled with Pressure. The procedure was tolerated well with a pain level of 0 throughout and a pain level of 0 following the procedure. Post Debridement Measurements: 0.8cm length x 2.4cm width x 0.2cm depth; 0.302cm^3 volume. Post debridement Stage noted as Category/Stage III. Post procedure Diagnosis Wound #7: Same as Pre-Procedure Plan Wound Cleansing: Wound #3 Midline Back: Clean wound with Normal Saline. May Shower, gently pat wound dry prior to applying new dressing. No tub bath. Wound #7 Right Ischial Tuberosity: Clean wound with Normal Saline. May Shower, gently pat wound dry prior to applying new dressing. No tub bath. Anesthetic: Wound #3 Midline Back: Topical Lidocaine 4% cream applied to wound bed prior to debridement Wound #7 Right Ischial Tuberosity: Topical Lidocaine 4% cream applied to wound bed prior to debridement Skin Barriers/Peri-Wound Care: Wound #3 Midline Back: Barrier cream Wound #7 Right Ischial Tuberosity: Barrier cream Primary Wound Dressing: Wound #7 Right Ischial Tuberosity: Santyl Ointment Wound #3 Midline Back: Other: - sobact and hydrogel Secondary Dressing: Wound #3 Midline Back: Boardered Foam Dressing Wound #7 Right Ischial Tuberosity: [place], Tatyanna [name]. ([id number]) Boardered Foam Dressing Dressing Change Frequency: Wound #3 Midline Back: Change dressing every  day. Wound #7 Right Ischial Tuberosity: Change dressing every day. Follow-up Appointments: Wound #3 Midline Back: Return Appointment in 1 week. Wound #7 Right Ischial Tuberosity: Return Appointment in 1 week. Off-Loading: Wound #3 Midline Back: Heel suspension boot to: - Sage boots heels Turn and reposition every 2 hours Mattress - Will refer patient to DME vendor Wound #7 Right Ischial Tuberosity: Heel suspension boot to: - Sage boots heels Turn and reposition every 2 hours Mattress - Will refer patient to DME vendor Home Health: Wound #3 Midline Back: Continue Home Health Visits - [facility] Nurse may visit PRN to address patient s wound care needs. FACE TO FACE ENCOUNTER: MEDICARE and MEDICAID PATIENTS: I certify that this patient is under my care and that I had a face-to-face encounter that meets the physician face-to-face encounter requirements with this patient on this date. The encounter with the patient was in whole or in part for the following MEDICAL CONDITION: (primary reason for [place]) MEDICAL  NECESSITY: I certify, that based on my findings, NURSING services are a medically necessary home health service. HOME BOUND STATUS: I certify that my clinical findings support that this patient is homebound (i.e., Due to illness or injury, pt requires aid of supportive devices such as crutches, cane, wheelchairs, walkers, the use of special transportation or the assistance of another person to leave their place of residence. There is a normal inability to leave the home and doing so requires considerable and taxing effort. Other absences are for medical reasons / religious services and are infrequent or of short duration when for other reasons). If current dressing causes regression in wound condition, may D/C ordered dressing product/s and apply Normal Saline Moist Dressing daily until next [place] / Other MD appointment. [place]  of regression in wound condition at [phone]. Please direct any NON-WOUND related issues/requests for orders to patient's Primary Care Physician Wound #7 Right Ischial Tuberosity: [place] Visits - [facility] Nurse may visit PRN to address patient s wound care needs. FACE TO FACE ENCOUNTER: MEDICARE and MEDICAID PATIENTS: I certify that this patient is under my care and that I had a face-to-face encounter that meets the physician face-to-face encounter requirements with this patient on this date. The encounter with the patient was in whole or in part for the following MEDICAL CONDITION: (primary reason for [place]) MEDICAL NECESSITY: I certify, that based on my findings, NURSING services are a medically necessary home health service. HOME BOUND STATUS: I certify that my clinical findings support that this patient is homebound (i.e., Due to illness or injury, pt requires aid of supportive devices such as crutches, cane, wheelchairs, walkers, the use of special transportation or the assistance of another person to leave their place of residence. There is a normal inability to leave the home and doing so requires considerable and taxing effort. Other absences are for medical reasons / religious services and are infrequent or of short duration when for other reasons). [place], [place] [name]. ([id number]) If current dressing causes regression in wound condition, may D/C ordered dressing product/s and apply Normal Saline Moist Dressing daily until next [place] / Other MD appointment. [place] of regression in wound condition at [phone]. Please direct any NON-WOUND related issues/requests for orders to patient's Primary Care Physician The patient has been and I have had a lengthy discussion about compliance and I have reiterated that she needs: 1. The air mattress and they need to actively pursue it with the vendor. 2. Vitamin C, zinc and a  multivitamin to be included in her diet in addition to high protein diet 3. Use Sorbact with hydrogel on the thoracic spine wound and cover it with a bordered foam 4. Use Santyl on the right hip 5. Continue to use heel protectors on both heels 6. Actively work on giving up smoking and we have discussed this at length including the various methodologies. I don't believe the patient is interested in giving up smoking. As noted above there are several detrimental factors to prevent the wound healing and the patient becomes rather upset when you bring these points up. Nonetheless I have discussed this today in front of her husband who is always very silent as he probably knows the wife's temperament. Electronic Signature(s) Signed: 08/15/2015 3:18:14 PM By: Christin Fudge MD, FACS Entered By: Christin Fudge on 08/15/2015 15:18:14 [place], Tonetta [name]. ([id number]) -------------------------------------------------------------------------------- SuperBill Details Patient Name: [name], Tyhesha [name]. Date of Service: 08/15/2015  Medical Record Number: [id number] Patient Account Number: [id number] Date of Birth/Sex: 06-01-1946 (69 y.o. 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, stage 3 Facility Procedures CPT4 Code: [id number] Description: [id number] - DEB SUBQ TISSUE 20 SQ CM/< ICD-10 Description Diagnosis 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, stage 3 L89.213 Pressure ulcer of right hip, stage 3  L89.100 Pressure ulcer of unspecified  part of back, uns Modifier: tageable Quantity: 1 Physician Procedures CPT4 Code: [id number] Description: [id number] - WC PHYS SUBQ TISS 20 SQ CM ICD-10 Description Diagnosis 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

## 2015-08-16 NOTE — Progress Notes (Signed)
CREEKMORE, Aili H. ([id number]) Visit Report for 08/15/2015 Arrival Information Details Patient Name: ROBBINS, Kalenna H. Date of Service: 08/15/2015 2:15 PM Medical Record Number: [id number] Patient Account Number: [id number] Date of Birth/Sex: October 04, 1945 (69 y.o. Female) Treating RN: Montey Hora Primary Care Physician: Paulita Cradle Other Clinician: Referring Physician: Paulita Cradle Treating Physician/Extender: Frann Rider in Treatment: 22 Visit Information History Since Last Visit Added or deleted any medications: No Patient Arrived: Walker Any new allergies or adverse reactions: No Arrival Time: 14:28 Had a fall or experienced change in No Accompanied By: spouse activities of daily living that may affect Transfer Assistance: None risk of falls: Patient Identification Verified: Yes Signs or symptoms of abuse/neglect since last No Secondary Verification Process Completed: Yes visito Patient Requires Transmission-Based No Hospitalized since last visit: No Precautions: Pain Present Now: No Patient Has Alerts: No Electronic Signature(s) Signed: 08/15/2015 5:50:47 PM By: Montey Hora Entered By: Montey Hora on 08/15/2015 14:30:24 Kross, Vondell H. ([id number]) -------------------------------------------------------------------------------- Encounter Discharge Information Details Patient Name: [name], Teya H. Date of Service: 08/15/2015 2:15 PM Medical Record Number: [id number] Patient Account Number: [id number] Date of Birth/Sex: 07-31-1946 (69 y.o. Female) Treating RN: Montey Hora Primary Care Physician: Paulita Cradle Other Clinician: Referring Physician: Paulita Cradle Treating Physician/Extender: Frann Rider in Treatment: 22 Encounter Discharge Information Items Discharge Pain Level: 0 Discharge Condition: Stable Ambulatory Status: Walker Discharge Destination: Home Transportation: Private Auto Accompanied By: spouse Schedule  Follow-up Appointment: Yes Medication Reconciliation completed No and provided to Patient/Care Ameshia Pewitt: Provided on Clinical Summary of Care: 08/15/2015 Form Type Recipient Paper Patient AS Electronic Signature(s) Signed: 08/15/2015 3:23:36 PM By: Ruthine Dose Entered By: Ruthine Dose on 08/15/2015 15:23:36 Mazzeo, Keymora H. ([id number]) -------------------------------------------------------------------------------- Multi Wound Chart Details Patient Name: [name], Calandria H. Date of Service: 08/15/2015 2:15 PM Medical Record Number: [id number] Patient Account Number: [id number] Date of Birth/Sex: September 13, 1946 (69 y.o. 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24] [4:0.013] [7:0.302] % Reduction in Area: [3:-446.40%]  [4:96.50%] [7:52.00%] % Reduction in Volume: -990.90% [4:96.40%] [7:3.80%] Classification: [3:Category/Stage II] [4:Category/Stage III] [7:Category/Stage III] HBO Classification: [3:N/A] [4:Grade 1] [7:N/A] Exudate Amount: [3:Medium] [4:Medium] [7:Medium] Exudate Type: [3:Serosanguineous] [4:Serosanguineous] [7:Serosanguineous] Exudate Color: [3:red, brown] [4:red, brown] [7:red, brown] Wound Margin: [3:Distinct, outline attached] [4:Distinct, outline attached] [7:Distinct, outline attached] Granulation Amount: [3:Large (67-100%)] [4:Large (67-100%)] [7:Small (1-33%)] Granulation Quality: [3:Red, Pink] [4:Pink] [7:N/A] Necrotic Amount: [3:Small (1-33%)] [4:None Present (0%)] [7:Large (67-100%)] Necrotic Tissue: [3:Adherent Slough] [4:N/A] [7:Eschar, Adherent Slough] Exposed Structures: [3:Fascia: No Fat: No Tendon: No] [4:Fascia: No Fat: No Tendon: No] [7:Fascia: No Fat: No Tendon: No] Muscle: No Muscle: No Muscle: No Joint: No Joint: No Joint: No Bone: No Bone: No Bone: No Limited to Skin Limited to Skin Limited to  Skin Breakdown Breakdown Breakdown Epithelialization: Medium (34-66%) None None Periwound Skin Texture: Edema: No Edema: No Edema: No Excoriation: No Excoriation: No Excoriation: No Induration: No Induration: No Induration: No Callus: No Callus: No Callus: No Crepitus: No Crepitus: No Crepitus: No Fluctuance: No Fluctuance: No Fluctuance: No Friable: No Friable: No Friable: No Rash: No Rash: No Rash: No Scarring: No Scarring: No Scarring: No Periwound Skin Moist: Yes Moist: Yes Moist: Yes Moisture: Maceration: No Maceration: No Maceration: No Dry/Scaly: No Dry/Scaly: No Dry/Scaly: No Periwound Skin Color: Atrophie Blanche: No Atrophie Blanche: No Atrophie Blanche: No Cyanosis: No Cyanosis: No Cyanosis: No Ecchymosis: No Ecchymosis: No Ecchymosis: No Erythema: No Erythema: No Erythema: No Hemosiderin Staining: No Hemosiderin Staining:  No Hemosiderin Staining: No Mottled: No Mottled: No Mottled: No Pallor: No Pallor: No Pallor: No Rubor: No Rubor: No Rubor: No Temperature: No Abnormality No Abnormality No Abnormality Tenderness on Yes Yes No Palpation: Wound Preparation: Ulcer Cleansing: Ulcer Cleansing: Ulcer Cleansing: Rinsed/Irrigated with Rinsed/Irrigated with Rinsed/Irrigated with Saline Saline Saline Topical Anesthetic Topical Anesthetic Topical Anesthetic Applied: Other: lidocaine Applied: None Applied: Other: lidocaine 4% 4% Treatment Notes Electronic Signature(s) Signed: 08/15/2015 2:47:53 PM By: Montey Hora Entered By: Montey Hora on 08/15/2015 14:47:53 Sharples, Deena H. ([id number]) -------------------------------------------------------------------------------- Multi-Disciplinary Care Plan Details Patient Name: [name], Tennile H. Date of Service: 08/15/2015 2:15 PM Medical Record Number: [id number] Patient Account Number: [id number] Date of Birth/Sex: 13-Jan-1946 (69 y.o. Female) Treating RN: Montey Hora Primary Care Physician: Paulita Cradle Other Clinician: Referring Physician: Paulita Cradle Treating Physician/Extender: Frann Rider in Treatment: 22 Active Inactive Abuse / Safety / Falls / Self Care Management Nursing Diagnoses: Impaired home maintenance Impaired physical mobility Knowledge deficit related to: safety; personal, health (wound), emergency Potential for falls Self care deficit: actual or potential Goals: Patient will remain injury free Date Initiated: 03/13/2015 Goal Status: Active Patient/caregiver will verbalize understanding of skin care regimen Date Initiated: 03/13/2015 Goal Status: Active Patient/caregiver will verbalize/demonstrate measure taken to improve self care Date Initiated: 03/13/2015 Goal Status: Active Patient/caregiver will verbalize/demonstrate measures taken to improve the patient's personal safety Date Initiated: 03/13/2015 Goal  Status: Active Patient/caregiver will verbalize/demonstrate measures taken to prevent injury and/or falls Date Initiated: 03/13/2015 Goal Status: Active Patient/caregiver will verbalize/demonstrate understanding of what to do in case of emergency Date Initiated: 03/13/2015 Goal Status: Active Interventions: Assess fall risk on admission and as needed Assess: immobility, friction, shearing, incontinence upon admission and as needed Assess impairment of mobility on admission and as needed per policy Assess self care needs on admission and as needed Provide education on basic hygiene Hassinger, Barre. ([id number]) Provide education on fall prevention Provide education on personal and home safety Provide education on safe transfers Treatment Activities: Education provided on Basic Hygiene : 03/13/2015 Notes: Orientation to the Wound Care Program Nursing Diagnoses: Knowledge deficit related to the wound healing center program Goals: Patient/caregiver will verbalize understanding of the [facility] Date Initiated: 03/13/2015 Goal Status: Active Interventions: Provide education on orientation to the wound center Notes: Pressure Nursing Diagnoses: Knowledge deficit related to causes and risk factors for pressure ulcer development Knowledge deficit related to management of pressures ulcers Potential for impaired tissue integrity related to pressure, friction, moisture, and shear Goals: Patient will remain free from development of additional pressure ulcers Date Initiated: 03/13/2015 Goal Status: Active Patient will remain free of pressure ulcers Date Initiated: 03/13/2015 Goal Status: Active Patient/caregiver will verbalize risk factors for pressure ulcer development Date Initiated: 03/13/2015 Goal Status: Active Patient/caregiver will verbalize understanding  of pressure ulcer management Date Initiated: 03/13/2015 Goal Status: Active Interventions: Assess: immobility,  friction, shearing, incontinence upon admission and as needed Arocho, Joann H. ([id number]) Assess offloading mechanisms upon admission and as needed Assess potential for pressure ulcer upon admission and as needed Provide education on pressure ulcers Treatment Activities: Patient referred for home evaluation of offloading devices/mattresses : 08/15/2015 Patient referred for pressure reduction/relief devices : 08/15/2015 Patient referred for seating evaluation to ensure proper offloading : 08/15/2015 Pressure reduction/relief device ordered : 08/15/2015 Test ordered outside of clinic : 08/15/2015 Notes: Wound/Skin Impairment Nursing Diagnoses: Impaired tissue integrity Knowledge deficit related to ulceration/compromised skin integrity Goals: Patient/caregiver will verbalize understanding of skin care regimen Date Initiated: 03/13/2015 Goal Status: Active Ulcer/skin breakdown will have a volume reduction of 30% by week 4 Date Initiated: 03/13/2015 Goal Status: Active Ulcer/skin breakdown will have a volume reduction of 50% by week 8 Date Initiated: 03/13/2015 Goal Status: Active Ulcer/skin breakdown will have a volume reduction of 80% by week 12 Date Initiated: 03/13/2015 Goal Status: Active Ulcer/skin breakdown will heal within 14 weeks Date Initiated: 03/13/2015 Goal Status: Active Interventions: Assess patient/caregiver ability to obtain necessary supplies Assess patient/caregiver ability to perform ulcer/skin care regimen upon admission and as needed Assess ulceration(s) every visit Provide education on smoking Provide education on ulcer and skin care Treatment Activities: Patient referred to home care : 08/15/2015 SHUBIN, [place]. ([id number]) Referred to DME Tyshana Nishida for dressing supplies : 08/15/2015 Skin care regimen initiated : 08/15/2015 Topical wound management initiated : 08/15/2015 Notes: Electronic Signature(s) Signed: 08/15/2015 2:47:45 PM By: Montey Hora Entered By:  Montey Hora on 08/15/2015 14:47:45 Little, Adaly H. ([id number]) -------------------------------------------------------------------------------- Patient/Caregiver Education Details Patient Name: [name], Jacqulyne H. Date of Service: 08/15/2015 2:15 PM Medical Record Number: [id number] Patient Account Number: [id number] Date of Birth/Gender: May 14, 1946 (69 y.o. Female) Treating RN: Montey Hora Primary Care Physician: Paulita Cradle Other Clinician: Referring Physician: Paulita Cradle Treating Physician/Extender: Frann Rider in Treatment: 22 Education Assessment Education Provided To: Patient and Caregiver Education Topics Provided Wound/Skin Impairment: Handouts: Other: wound care as ordered Methods: Demonstration, Explain/Verbal Responses: State content correctly Electronic Signature(s) Signed: 08/15/2015 5:50:47 PM By: Montey Hora Entered By: Montey Hora on 08/15/2015 15:21:31 Buelow, Nahla H. ([id number]) -------------------------------------------------------------------------------- Wound Assessment Details Patient Name: Ellerby, Adabelle H. Date of Service: 08/15/2015 2:15 PM Medical Record Number: [id number] Patient Account Number: [id number] Date of Birth/Sex: [birth year] (69 y.o. Female) Treating RN: Montey Hora Primary Care Physician: Paulita Cradle Other Clinician: Referring Physician: Paulita Cradle Treating Physician/Extender: Frann Rider in Treatment: 22 Wound Status Wound Number: 3 Primary Pressure Ulcer Etiology: Wound Location: Back - Midline Wound Open Wounding Event: Pressure Injury Status: Date Acquired: 05/02/2015 Comorbid Cataracts, Asthma, Hypertension, Type Weeks Of Treatment: 15 History: II Diabetes, Neuropathy, Received Clustered Wound: No Chemotherapy Photos Photo Uploaded By: Montey Hora on 08/15/2015 17:10:49 Wound Measurements Length: (cm) 1.7 Width: (cm) 0.9 Depth: (cm) 0.2 Area: (cm) 1.202 Volume:  (cm) 0.24 % Reduction in Area: -446.4% % Reduction in Volume: -990.9% Epithelialization: Medium (34-66%) Tunneling: No Undermining: No Wound Description Classification: Category/Stage II Wound Margin: Distinct, outline attached Exudate Amount: Medium Exudate Type: Serosanguineous Exudate Color: red, brown Foul Odor After Cleansing: No Wound Bed Granulation Amount: Large (67-100%) Exposed Structure Granulation Quality: Red, Pink Fascia Exposed: No Necrotic Amount: Small (1-33%) Fat Layer Exposed: No Necrotic Quality: Adherent Slough Tendon Exposed: No Kohlmann, Jocelyne H. ([id number]) Muscle Exposed: No Joint Exposed: No Bone Exposed: No Limited to Skin Breakdown Periwound Skin Texture Texture Color No Abnormalities Noted:  No No Abnormalities Noted: No Callus: No Atrophie Blanche: No Crepitus: No Cyanosis: No Excoriation: No Ecchymosis: No Fluctuance: No Erythema: No Friable: No Hemosiderin Staining: No Induration: No Mottled: No Localized Edema: No Pallor: No Rash: No Rubor: No Scarring: No Temperature / Pain Moisture Temperature: No Abnormality No Abnormalities Noted: No Tenderness on Palpation: Yes Dry / Scaly: No Maceration: No Moist: Yes Wound Preparation Ulcer Cleansing: Rinsed/Irrigated with Saline Topical Anesthetic Applied: Other: lidocaine 4%, Treatment Notes Wound #3 (Midline Back) 1. Cleansed with: Clean wound with Normal Saline 2. Anesthetic Topical Lidocaine 4% cream to wound bed prior to debridement 3. Peri-wound Care: Barrier cream 4. Dressing Applied: Hydrogel Other dressing (specify in notes) 5. Secondary Dressing Applied Bordered Foam Dressing Notes sorbact Electronic Signature(s) Signed: 08/15/2015 2:46:59 PM By: Montey Hora Entered By: Montey Hora on 08/15/2015 14:46:59 Goza, Zaela H. ([id number]) Cirilo, Mancos ([id number]) -------------------------------------------------------------------------------- Wound  Assessment Details Patient Name: Lundy, Yarnell H. Date of Service: 08/15/2015 2:15 PM Medical Record Number: [id number] Patient Account Number: [id number] Date of Birth/Sex: 1946-02-09 (69 y.o. Female) Treating RN: Montey Hora Primary Care Physician: Paulita Cradle Other Clinician: Referring Physician: Paulita Cradle Treating Physician/Extender: Frann Rider in Treatment: 22 Wound Status Wound Number: 4 Primary Pressure Ulcer Etiology: Wound Location: Right Calcaneous Wound Healed - Epithelialized Wounding Event: Pressure Injury Status: Date Acquired: 05/11/2015 Comorbid Cataracts, Asthma, Hypertension, Type Weeks Of Treatment: 13 History: II Diabetes, Neuropathy, Received Clustered Wound: No Chemotherapy Photos Photo Uploaded By: Montey Hora on 08/15/2015 17:10:34 Wound Measurements Length: (cm) 0 % Redu Width: (cm) 0 % Redu Depth: (cm) 0 Epithe Area: (cm) 0 Tunne Volume: (cm) 0 Under ction in Area: 100% ction in Volume: 100% lialization: None ling: No mining: No Wound Description Classification: Category/Stage III Foul Diabetic Severity (Wagner): Grade 1 Wound Margin: Distinct, outline attached Exudate Amount: Medium Exudate Type: Serosanguineous Exudate Color: red, brown Odor After Cleansing: No Wound Bed Granulation Amount: Large (67-100%) Exposed Structure Granulation Quality: Pink Fascia Exposed: No Necrotic Amount: None Present (0%) Fat Layer Exposed: No Cabello, Saraiyah H. ([id number]) Tendon Exposed: No Muscle Exposed: No Joint Exposed: No Bone Exposed: No Limited to Skin Breakdown Periwound Skin Texture Texture Color No Abnormalities Noted: No No Abnormalities Noted: No Callus: No Atrophie Blanche: No Crepitus: No Cyanosis: No Excoriation: No Ecchymosis: No Fluctuance: No Erythema: No Friable: No Hemosiderin Staining: No Induration: No Mottled: No Localized Edema: No Pallor: No Rash: No Rubor: No Scarring: No  Temperature / Pain Moisture Temperature: No Abnormality No Abnormalities Noted: No Tenderness on Palpation: Yes Dry / Scaly: No Maceration: No Moist: Yes Wound Preparation Ulcer Cleansing: Rinsed/Irrigated with Saline Topical Anesthetic Applied: None Electronic Signature(s) Signed: 08/15/2015 5:50:47 PM By: Montey Hora Previous Signature: 08/15/2015 2:47:21 PM Version By: Montey Hora Entered By: Montey Hora on 08/15/2015 14:57:28 Beckley, Bruce H. ([id number]) -------------------------------------------------------------------------------- Wound Assessment Details Patient Name: Wachob, Corin H. Date of Service: 08/15/2015 2:15 PM Medical Record Number: [id number] Patient Account Number: [id number] Date of Birth/Sex: [birth year] (69 y.o. 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 Wound Open Wounding Event: Pressure Injury Status: Date Acquired: 08/01/2015 Comorbid Cataracts, Asthma, Hypertension, Type Weeks Of Treatment: 1 History: II Diabetes, Neuropathy, Received Clustered Wound: No Chemotherapy Photos Photo Uploaded By: Montey Hora on 08/15/2015 17:10:34 Wound Measurements Length: (cm) 0.8 Width: (cm) 2.4 Depth: (cm) 0.2 Area: (cm) 1.508  Volume: (cm) 0.302 % Reduction in Area: 52% % Reduction in Volume: 3.8% Epithelialization: None Tunneling: No Undermining: No Wound Description Classification: Category/Stage III Wound Margin: Distinct, outline attached Exudate Amount: Medium Exudate Type: Serosanguineous Exudate Color: red, brown Foul Odor After Cleansing: No Wound Bed Granulation Amount: Small (1-33%) Exposed Structure Necrotic Amount: Large (67-100%) Fascia Exposed: No Necrotic  Quality: Eschar, Adherent Slough Fat Layer Exposed: No Tendon Exposed: No Antwine, Lindzie H. ([id number]) Muscle Exposed: No Joint Exposed: No Bone Exposed: No Limited to Skin Breakdown Periwound Skin Texture Texture Color No Abnormalities Noted: No No Abnormalities Noted: No Callus: No Atrophie Blanche: No Crepitus: No Cyanosis: No Excoriation: No Ecchymosis: No Fluctuance: No Erythema: No Friable: No Hemosiderin Staining: No Induration: No Mottled: No Localized Edema: No Pallor: No Rash: No Rubor: No Scarring: No Temperature / Pain Moisture Temperature: No Abnormality No Abnormalities Noted: No Dry / Scaly: No Maceration: No Moist: Yes Wound Preparation Ulcer Cleansing: Rinsed/Irrigated with Saline Topical Anesthetic Applied: Other: lidocaine 4%, Treatment Notes Wound #7 (Right Ischial Tuberosity) 1. Cleansed with: Clean wound with Normal Saline 2. Anesthetic Topical Lidocaine 4% cream to wound bed prior to debridement 3. Peri-wound Care: Barrier cream 4. Dressing Applied: Santyl Ointment 5. Secondary Dressing Applied Bordered Foam Dressing Electronic Signature(s) Signed: 08/15/2015 2:47:36 PM By: Montey Hora Entered By: Montey Hora on 08/15/2015 14:47:36 Malizia, Melissaann H. ([id number]) -------------------------------------------------------------------------------- Vitals Details Patient Name: [name], Danyiel H. Date of Service: 08/15/2015 2:15 PM Medical Record Number: [id number] Patient Account Number: [id number] Date of Birth/Sex: 1945/11/30 (69 y.o. Female) Treating RN: Montey Hora Primary Care Physician: Paulita Cradle Other Clinician: Referring Physician: Paulita Cradle Treating Physician/Extender: Frann Rider in Treatment: 22 Vital Signs Time Taken: 14:31 Temperature (F): 98.1 Height (in): 65 Pulse (bpm): 101 Weight (lbs): 118 Respiratory Rate (breaths/min): 18 Body Mass Index (BMI): 19.6 Blood Pressure (mmHg):  100/55 Reference Range: 80 - 120 mg / dl Electronic Signature(s) Signed: 08/15/2015 5:50:47 PM By: Montey Hora Entered By: Montey Hora on 08/15/2015 14:32:32

## 2015-08-22 ENCOUNTER — Encounter: Payer: Medicare Other | Admitting: Surgery

## 2015-08-22 DIAGNOSIS — E11621 Type 2 diabetes mellitus with foot ulcer: Secondary | ICD-10-CM | POA: Diagnosis not present

## 2015-08-27 NOTE — Progress Notes (Signed)
[name], [name] Visit Report for 08/22/2015 Chief Complaint Document Details Patient Name: BUREAU, [name] Date of Service: 08/22/2015 2:15 PM Medical Record Number: [id number] Patient Account Number: [id number] Date of Birth/Sex: Mar 03, 1946 (69 y.o. Female) Treating RN: [name] Primary Care Physician: Paulita Cradle Other Clinician: Referring Physician: Paulita Cradle Treating Physician/Extender: Frann Rider in Treatment: 23 Information Obtained from: Patient Chief Complaint Patient presents to the wound care center for a consult due non healing wound. Ulcers on the right elbow and the right heel for about 1 month. Electronic Signature(s) Signed: 08/22/2015 3:37:53 PM By: Christin Fudge MD, FACS Entered By: Christin Fudge on 08/22/2015 15:37:53 [name], [name] -------------------------------------------------------------------------------- Debridement Details Patient Name: [name], [name] Date of Service: 08/22/2015 2:15 PM Medical Record Number: [id number] Patient Account Number: [id number] Date of Birth/Sex: [birth year] (69 y.o. Female) Treating RN: Carolyne Fiscal, Debi Primary Care Physician: Paulita Cradle Other Clinician: Referring Physician: Paulita Cradle Treating Physician/Extender: Frann Rider in Treatment: 23 Debridement Performed for Wound #7 Right Ischial Tuberosity Assessment: Performed By: Physician Christin Fudge, MD Debridement: Debridement Pre-procedure Yes Verification/Time Out Taken: Start Time: 15:03 Pain Control: Other : LIDOCAINE 4% Level: Skin/Subcutaneous Tissue Total Area Debrided (L x 0.8 (cm) x 2.4 (cm) = 1.92 (cm) W): Tissue and other Viable, Non-Viable, Eschar, Exudate, Fibrin/Slough, Subcutaneous material debrided: Instrument: Curette Bleeding: Minimum Hemostasis Achieved: Pressure End Time: 15:04 Procedural Pain: 4 Post Procedural Pain: 4 Response to Treatment: Procedure was  tolerated well Post Debridement Measurements of Total Wound Length: (cm) 0.8 Stage: Category/Stage III Width: (cm) 2.4 Depth: (cm) 0.3 Volume: (cm) 0.452 Post Procedure Diagnosis Same as Pre-procedure Electronic Signature(s) Signed: 08/22/2015 3:37:47 PM By: Christin Fudge MD, FACS Signed: 08/26/2015 3:59:50 PM By: Alric Quan Entered By: Christin Fudge on 08/22/2015 15:37:47 [name], [name] -------------------------------------------------------------------------------- HPI Details Patient Name: [name], [name] Date of Service: 08/22/2015 2:15 PM Medical Record Number: [id number] Patient Account Number: [id number] Date of Birth/Sex: 19-Jun-1946 (69 y.o. Female) Treating RN: Carolyne Fiscal, Debi Primary Care Physician: Paulita Cradle Other Clinician: Referring Physician: Paulita Cradle Treating Physician/Extender: Frann Rider in Treatment: 23 History of Present Illness Location: Ulceration on the right heel and the right elbow. Quality: Patient reports experiencing a dull pain to affected area(s). Severity: Patient states wound (s) are getting better. Duration: Patient has had the wound for < 4 weeks prior to presenting for treatment Timing: Pain in wound is Intermittent (comes and goes Context: The wound appeared gradually over time Modifying Factors: Consults to this date include:Augmentin and Bactrim and also some heel protection with duoderm Associated Signs and Symptoms: Patient reports having difficulty standing for long periods. HPI Description: 69 year old female with history of peripheral neuropathy, history of diet controlled diabetes mellitus type 2, history of alcoholism here for wound consult sent by her PCP Dr. Sherilyn Cooter. She has pressure ulcers at her right elbow, bilateral heels. Plain films of right calcaneus without acute bony process. Patient started by PCP on Augmentin, Bactrim as per orders, DuoDerm dressings applied - reports  some improvement in her ulcer since last seen. Denies fever, chills, nausea, vomiting, diarrhea. She had a right humerus fracture in the middle of May and has had no surgery and arm is in a sling. She is also been laying in the bed for quite a while. Past medical history significant for essential hypertension, osteoporosis, peripheral neuropathy, alcoholism, ataxia, personal history of breast cancer treated with surgery chemotherapy and radiation and this was done in December 2010. she is also status post laparoscopic  cholecystectomy, pilonidal cyst excision, subcutaneous port placement, partial mastectomy on the left side, skin cancer removal. 03/21/2015 -- she says overall she's been doing better and continues to smoke about 15 cigarettes a day. 03/21/2015 - her orthopedic doctor has said she may require surgery for her right humerus fracture. 04/04/2015 -- her orthopedic surgery has been scheduled for August 11. 04/18/2015 -- she is doing fine as far as her elbow and her right heel goes but she has developed some redness over prominence on her thoracic spine and wanted me to take a look at this. 05/02/2015 -- she had her surgery done and now is in a sling and support. Her back has developed a pressure injury of undetermined stage. She seems to be in better spirits. 05/16/2015 -- last week her right heel was looking great and we had healed it out, but she has not been offloading appropriately and has a deep tissue injury on the right heel again. The area on her right elbow has opened out with slough and the area in the thoracic spine is also getting worse. 05/30/2015 -- she has developed 2 new ulcerations one on her left ischial tuberosity and one on the sacral region. She is still working on getting up smoking but is also unable to take her vitamins as she says she develops a diarrhea when she takes vitamins. She has increased her intake of proteins. 06/06/2015 -- the patient's husband  manages to get her a low air loss mattress with initiating pressure but [name], [name] did not know how to use it exactly and the patient was not happy about using it. Overall she says she's been feeling better. 07/11/2015 -- . Discussed a surgical opinion for debridement and application of a wound VAC, 2 weeks ago but the patient has been reluctant to get a surgical opinion as she wants to avoid surgery. 07/18/2015 -- they have an appointment to see Dr. Tamala Julian this coming Wednesday. 07/29/2015 -- they saw Dr. Tamala Julian in his office and he did a debridement of the wound and removed significant amount of slough. This is in addition to the debridement I had done previously on Friday where a large amount of the eschar was removed. He did not recommend the application of wound VAC. Addendum : Dr. Thompson Caul note was reviewed via EPIC and details noted as above. 08/05/2015 -- over the last week she has developed a another pressure injury to her right hip and has had significant discoloration of the skin and an eschar there. 08/15/2015 -- she is still smoking about a pack of cigarettes a day and does not seem to want to quit. They have not been able to talk to the vendor regarding the air mattress and I will ask them to get in touch again. She is reluctant to take vitamins and does admit her nutrition is poor. 08/22/2015 -- she has been unable to tolerate her vitamins and continues to smoke. They're working on getting a low air loss mattress and have been speaking with the vendor. Electronic Signature(s) Signed: 08/22/2015 3:38:29 PM By: Christin Fudge MD, FACS Entered By: Christin Fudge on 08/22/2015 15:38:29 [name], [name] ([id number]) -------------------------------------------------------------------------------- Physical Exam Details Patient Name: Umscheid, Patra [name]. Date of Service: 08/22/2015 2:15 PM Medical Record Number: [id number] Patient Account Number: [id number] Date of Birth/Sex:  01-08-1946 (69 y.o. Female) Treating RN: [name] Primary Care Physician: Paulita Cradle Other Clinician: Referring Physician: Paulita Cradle Treating Physician/Extender: Frann Rider in Treatment: 23 Constitutional . Pulse regular.  Respirations normal and unlabored. Afebrile. . Eyes Nonicteric. Reactive to light. Ears, Nose, Mouth, and Throat Lips, teeth, and gums WNL.Marland Kitchen Moist mucosa without lesions . Neck supple and nontender. No palpable supraclavicular or cervical adenopathy. Normal sized without goiter. Respiratory WNL. No retractions.. Cardiovascular Pedal Pulses WNL. No clubbing, cyanosis or edema. Lymphatic No adneopathy. No adenopathy. No adenopathy. Musculoskeletal Adexa without tenderness or enlargement.. Digits and nails w/o clubbing, cyanosis, infection, petechiae, ischemia, or inflammatory conditions.. Integumentary (Hair, Skin) No suspicious lesions. No crepitus or fluctuance. No peri-wound warmth or erythema. No masses.Marland Kitchen Psychiatric Judgement and insight Intact.. No evidence of depression, anxiety, or agitation.. Notes The thoracic spine is looking excellent and has healthy granulation tissue and is coughing much smaller. The pressure ulcer on the right hip has 2 separate areas which will need sharp debridement with a curette to remove subcutaneous debris. Electronic Signature(s) Signed: 08/22/2015 3:41:14 PM By: Christin Fudge MD, FACS Entered By: Christin Fudge on 08/22/2015 15:41:14 [name], [name] -------------------------------------------------------------------------------- Physician Orders Details Patient Name: [name], Shelitha [name]. Date of Service: 08/22/2015 2:15 PM Medical Record Number: [id number] Patient Account Number: [id number] Date of Birth/Sex: 1946-07-07 (69 y.o. Female) Treating RN: Carolyne Fiscal, Debi Primary Care Physician: Paulita Cradle Other Clinician: Referring Physician: Paulita Cradle Treating  Physician/Extender: Frann Rider in Treatment: 13 Verbal / Phone Orders: Yes Clinician: Carolyne Fiscal, Debi Read Back and Verified: Yes Diagnosis Coding Wound Cleansing Wound #3 Midline Back o Clean wound with Normal Saline. o May Shower, gently pat wound dry prior to applying new dressing. o No tub bath. Wound #7 Right Ischial Tuberosity o Clean wound with Normal Saline. o May Shower, gently pat wound dry prior to applying new dressing. o No tub bath. Anesthetic Wound #3 Midline Back o Topical Lidocaine 4% cream applied to wound bed prior to debridement Wound #7 Right Ischial Tuberosity o Topical Lidocaine 4% cream applied to wound bed prior to debridement Skin Barriers/Peri-Wound Care Wound #3 Midline Back o Barrier cream Wound #7 Right Ischial Tuberosity o Barrier cream Primary Wound Dressing Wound #3 Midline Back o Other: - sobact and hydrogel Wound #7 Right Ischial Tuberosity o Santyl Ointment Secondary Dressing Wound #3 Midline Back o Boardered Foam Dressing [name], [name] Wound #7 Right Ischial Tuberosity o Boardered Foam Dressing Dressing Change Frequency Wound #3 Midline Back o Change dressing every day. Wound #7 Right Ischial Tuberosity o Change dressing every day. Follow-up Appointments Wound #3 Midline Back o Return Appointment in 1 week. Wound #7 Right Ischial Tuberosity o Return Appointment in 1 week. Off-Loading Wound #3 Midline Back o Heel suspension boot to: - Sage boots heels o Turn and reposition every 2 hours o Mattress - Will refer patient to DME vendor Wound #7 Right Ischial Tuberosity o Heel suspension boot to: - Sage boots heels o Turn and reposition every 2 hours o Mattress - Will refer patient to DME vendor [place] #3 Midline Back o [place] Visits - [place] Nurse may visit PRN to address patientos wound care needs. o FACE TO  FACE ENCOUNTER: MEDICARE and MEDICAID PATIENTS: I certify that this patient is under my care and that I had a face-to-face encounter that meets the physician face-to-face encounter requirements with this patient on this date. The encounter with the patient was in whole or in part for the following MEDICAL CONDITION: (primary reason for [place]) MEDICAL NECESSITY: I certify, that based on my findings, NURSING services are a medically necessary home health service. HOME BOUND  STATUS: I certify that my clinical findings support that this patient is homebound (i.e., Due to illness or injury, pt requires aid of supportive devices such as crutches, cane, wheelchairs, walkers, the use of special transportation or the assistance of another person to leave their place of residence. There is a normal inability to leave the home and doing so requires considerable and taxing effort. Other absences are for medical reasons / religious services and are infrequent or of short duration when for other reasons). o If current dressing causes regression in wound condition, may D/C ordered dressing product/s and apply Normal Saline Moist Dressing daily until next [place] / Other MD appointment. [place] of regression in wound condition at [phone]. [place], Marlisa [name]. ([id number]) o Please direct any NON-WOUND related issues/requests for orders to patient's Primary Care Physician Wound #7 Right Ischial [place] Visits - [place] Nurse may visit PRN to address patientos wound care needs. o FACE TO FACE ENCOUNTER: MEDICARE and MEDICAID PATIENTS: I certify that this patient is under my care and that I had a face-to-face encounter that meets the physician face-to-face encounter requirements with this patient on this date. The encounter with the patient was in whole or in part for the following MEDICAL CONDITION: (primary reason for  [place]) MEDICAL NECESSITY: I certify, that based on my findings, NURSING services are a medically necessary home health service. HOME BOUND STATUS: I certify that my clinical findings support that this patient is homebound (i.e., Due to illness or injury, pt requires aid of supportive devices such as crutches, cane, wheelchairs, walkers, the use of special transportation or the assistance of another person to leave their place of residence. There is a normal inability to leave the home and doing so requires considerable and taxing effort. Other absences are for medical reasons / religious services and are infrequent or of short duration when for other reasons). o If current dressing causes regression in wound condition, may D/C ordered dressing product/s and apply Normal Saline Moist Dressing daily until next [place] / Other MD appointment. [place] of regression in wound condition at [phone]. o Please direct any NON-WOUND related issues/requests for orders to patient's Primary Care Physician Electronic Signature(s) Signed: 08/22/2015 4:13:31 PM By: Christin Fudge MD, FACS Signed: 08/26/2015 3:59:50 PM By: Alric Quan Entered By: Alric Quan on 08/22/2015 15:05:25 [name], [name] -------------------------------------------------------------------------------- Problem List Details Patient Name: [name], [name] Date of Service: 08/22/2015 2:15 PM Medical Record Number: [id number] Patient Account Number: [id number] Date of Birth/Sex: 10/10/1945 (69 y.o. Female) Treating RN: [name] Primary Care Physician: Paulita Cradle Other Clinician: Referring Physician: Paulita Cradle Treating Physician/Extender: Frann Rider in Treatment: 23 Active Problems ICD-10 Encounter Code Description Active Date Diagnosis E11.621 Type 2 diabetes mellitus with foot ulcer 03/13/2015 Yes L89.613 Pressure ulcer of right  heel, stage 3 03/13/2015 Yes F17.218 Nicotine dependence, cigarettes, with other nicotine- 03/13/2015 Yes induced disorders L89.100 Pressure ulcer of unspecified part of back, unstageable 05/02/2015 Yes L89.213 Pressure ulcer of right hip, stage 3 08/05/2015 Yes Inactive Problems Resolved Problems ICD-10 Code Description Active Date Resolved Date L89.013 Pressure ulcer of right elbow, stage 3 03/13/2015 03/13/2015 [id number] Pressure ulcer of left hip, stage 2 05/30/2015 05/30/2015 L89.153 Pressure ulcer of sacral region, stage 3 05/30/2015 05/30/2015 Electronic Signature(s) [name], Afia Lemmie Evens ([id number]) Signed: 08/22/2015 3:37:32 PM By: Christin Fudge MD, FACS Entered By: Christin Fudge on 08/22/2015 15:37:32 Kimple, Autum [name]. ([id number]) --------------------------------------------------------------------------------  Progress Note Details Patient Name: [name], [name] Date of Service: 08/22/2015 2:15 PM Medical Record Number: [id number] Patient Account Number: [id number] Date of Birth/Sex: [birth year] (69 y.o. Female) Treating RN: [name] Primary Care Physician: Paulita Cradle Other Clinician: Referring Physician: Paulita Cradle Treating Physician/Extender: Frann Rider in Treatment: 23 Subjective Chief Complaint Information obtained from Patient Patient presents to the wound care center for a consult due non healing wound. Ulcers on the right elbow and the right heel for about 1 month. History of Present Illness (HPI) The following HPI elements were documented for the patient's wound: Location: Ulceration on the right heel and the right elbow. Quality: Patient reports experiencing a dull pain to affected area(s). Severity: Patient states wound (s) are getting better. Duration: Patient has had the wound for < 4 weeks prior to presenting for treatment Timing: Pain in wound is Intermittent (comes and goes Context: The wound appeared gradually over time Modifying Factors:  Consults to this date include:Augmentin and Bactrim and also some heel protection with duoderm Associated Signs and Symptoms: Patient reports having difficulty standing for long periods. 69 year old female with history of peripheral neuropathy, history of diet controlled diabetes mellitus type 2, history of alcoholism here for wound consult sent by her PCP Dr. Sherilyn Cooter. She has pressure ulcers at her right elbow, bilateral heels. Plain films of right calcaneus without acute bony process. Patient started by PCP on Augmentin, Bactrim as per orders, DuoDerm dressings applied - reports some improvement in her ulcer since last seen. Denies fever, chills, nausea, vomiting, diarrhea. She had a right humerus fracture in the middle of May and has had no surgery and arm is in a sling. She is also been laying in the bed for quite a while. Past medical history significant for essential hypertension, osteoporosis, peripheral neuropathy, alcoholism, ataxia, personal history of breast cancer treated with surgery chemotherapy and radiation and this was done in December 2010. she is also status post laparoscopic cholecystectomy, pilonidal cyst excision, subcutaneous port placement, partial mastectomy on the left side, skin cancer removal. 03/21/2015 -- she says overall she's been doing better and continues to smoke about 15 cigarettes a day. 03/21/2015 - her orthopedic doctor has said she may require surgery for her right humerus fracture. 04/04/2015 -- her orthopedic surgery has been scheduled for August 11. 04/18/2015 -- she is doing fine as far as her elbow and her right heel goes but she has developed some redness over prominence on her thoracic spine and wanted me to take a look at this. [place], [name] 05/02/2015 -- she had her surgery done and now is in a sling and support. Her back has developed a pressure injury of undetermined stage. She seems to be in better spirits. 05/16/2015 -- last week  her right heel was looking great and we had healed it out, but she has not been offloading appropriately and has a deep tissue injury on the right heel again. The area on her right elbow has opened out with slough and the area in the thoracic spine is also getting worse. 05/30/2015 -- she has developed 2 new ulcerations one on her left ischial tuberosity and one on the sacral region. She is still working on getting up smoking but is also unable to take her vitamins as she says she develops a diarrhea when she takes vitamins. She has increased her intake of proteins. 06/06/2015 -- the patient's husband manages to get her a low air loss mattress with initiating pressure but did not  know how to use it exactly and the patient was not happy about using it. Overall she says she's been feeling better. 07/11/2015 -- . Discussed a surgical opinion for debridement and application of a wound VAC, 2 weeks ago but the patient has been reluctant to get a surgical opinion as she wants to avoid surgery. 07/18/2015 -- they have an appointment to see Dr. Tamala Julian this coming Wednesday. 07/29/2015 -- they saw Dr. Tamala Julian in his office and he did a debridement of the wound and removed significant amount of slough. This is in addition to the debridement I had done previously on Friday where a large amount of the eschar was removed. He did not recommend the application of wound VAC. Addendum : Dr. Thompson Caul note was reviewed via EPIC and details noted as above. 08/05/2015 -- over the last week she has developed a another pressure injury to her right hip and has had significant discoloration of the skin and an eschar there. 08/15/2015 -- she is still smoking about a pack of cigarettes a day and does not seem to want to quit. They have not been able to talk to the vendor regarding the air mattress and I will ask them to get in touch again. She is reluctant to take vitamins and does admit her nutrition is poor. 08/22/2015 --  she has been unable to tolerate her vitamins and continues to smoke. They're working on getting a low air loss mattress and have been speaking with the vendor. Objective Constitutional Pulse regular. Respirations normal and unlabored. Afebrile. Vitals Time Taken: 2:39 PM, Height: 65 in, Weight: 118 lbs, BMI: 19.6, Temperature: 97.9 F, Pulse: 91 bpm, Respiratory Rate: 18 breaths/min, Blood Pressure: 102/52 mmHg. Eyes Nonicteric. Reactive to light. Ears, Nose, Mouth, and Throat Lips, teeth, and gums WNL.Marland Kitchen Moist mucosa without lesions . Neck supple and nontender. No palpable supraclavicular or cervical adenopathy. Normal sized without goiter. [place], Jarissa [name]. ([id number]) Respiratory WNL. No retractions.. Cardiovascular Pedal Pulses WNL. No clubbing, cyanosis or edema. Lymphatic No adneopathy. No adenopathy. No adenopathy. Musculoskeletal Adexa without tenderness or enlargement.. Digits and nails w/o clubbing, cyanosis, infection, petechiae, ischemia, or inflammatory conditions.Marland Kitchen Psychiatric Judgement and insight Intact.. No evidence of depression, anxiety, or agitation.. General Notes: The thoracic spine is looking excellent and has healthy granulation tissue and is coughing much smaller. The pressure ulcer on the right hip has 2 separate areas which will need sharp debridement with a curette to remove subcutaneous debris. Integumentary (Hair, Skin) No suspicious lesions. No crepitus or fluctuance. No peri-wound warmth or erythema. No masses.. Wound #3 status is Open. Original cause of wound was Pressure Injury. The wound is located on the Midline Back. The wound measures 2cm length x 1.1cm width x 0.2cm depth; 1.728cm^2 area and 0.346cm^3 volume. The wound is limited to skin breakdown. There is no tunneling or undermining noted. There is a medium amount of serous drainage noted. The wound margin is distinct with the outline attached to the wound base. There is large (67-100%) red,  pink granulation within the wound bed. There is no necrotic tissue within the wound bed. The periwound skin appearance exhibited: Localized Edema, Moist. The periwound skin appearance did not exhibit: Callus, Crepitus, Excoriation, Fluctuance, Friable, Induration, Rash, Scarring, Dry/Scaly, Maceration, Atrophie Blanche, Cyanosis, Ecchymosis, Hemosiderin Staining, Mottled, Pallor, Rubor, Erythema. Periwound temperature was noted as No Abnormality. The periwound has tenderness on palpation. Wound #7 status is Open. Original cause of wound was Pressure Injury. The wound is located on the Right Ischial Tuberosity.  The wound measures 0.8cm length x 2.4cm width x 0.2cm depth; 1.508cm^2 area and 0.302cm^3 volume. The wound is limited to skin breakdown. There is no tunneling or undermining noted. There is a medium amount of serosanguineous drainage noted. The wound margin is distinct with the outline attached to the wound base. There is no granulation within the wound bed. There is a large (67- 100%) amount of necrotic tissue within the wound bed including Adherent Slough. The periwound skin appearance exhibited: Localized Edema, Maceration, Moist. The periwound skin appearance did not exhibit: Callus, Crepitus, Excoriation, Fluctuance, Friable, Induration, Rash, Scarring, Dry/Scaly, Atrophie Blanche, Cyanosis, Ecchymosis, Hemosiderin Staining, Mottled, Pallor, Rubor, Erythema. Periwound temperature was noted as No Abnormality. [place], Chardonay [name]. 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 There was a  Skin/Subcutaneous Tissue Debridement [id number]) debridement with total area of 1.92 sq cm performed by Christin Fudge, MD. with the following instrument(s): Curette to remove Viable and Non-Viable tissue/material including Exudate, Fibrin/Slough, Eschar, and Subcutaneous after achieving pain control using Other (LIDOCAINE 4%). A time out was conducted prior to the start of the procedure. A Minimum amount of bleeding was controlled with Pressure. The procedure was tolerated well with a pain level of 4 throughout and a pain level of 4 following the procedure. Post Debridement Measurements: 0.8cm length x 2.4cm width x 0.3cm depth; 0.452cm^3 volume. Post debridement Stage noted as Category/Stage III. Post procedure Diagnosis Wound #7: Same as Pre-Procedure Plan Wound Cleansing: Wound #3 Midline Back: Clean wound with Normal Saline. May Shower, gently pat wound dry prior to applying new dressing. No tub bath. Wound #7 Right Ischial Tuberosity: Clean wound with Normal Saline. May Shower, gently pat wound dry prior to applying new dressing. No tub bath. Anesthetic: Wound #3 Midline Back: Topical Lidocaine 4% cream applied to wound bed prior to debridement Wound #7 Right Ischial Tuberosity: [place], Rateel [name]. ([id number]) Topical Lidocaine 4% cream applied to wound bed prior to debridement Skin Barriers/Peri-Wound Care: Wound #3 Midline Back: Barrier cream Wound #7 Right Ischial Tuberosity: Barrier cream Primary Wound Dressing: Wound #3 Midline Back: Other: - sobact and hydrogel Wound #7 Right Ischial Tuberosity: Santyl Ointment Secondary Dressing: Wound #3 Midline Back: Boardered Foam Dressing Wound #7 Right Ischial Tuberosity: Boardered Foam Dressing Dressing Change Frequency: Wound #3 Midline Back: Change dressing every day. Wound #7 Right Ischial Tuberosity: Change dressing every day. Follow-up Appointments: Wound #3 Midline Back: Return Appointment in 1 week. Wound #7  Right Ischial Tuberosity: Return Appointment in 1 week. Off-Loading: Wound #3 Midline Back: Heel suspension boot to: - Sage boots heels Turn and reposition every 2 hours Matt[place] - Will refer patient to DME vendor Wound #7 Right Ischial Tuberosity: Heel suspension boot to: - Sage boots heels Turn and reposition every 2 hours Mattress - Will refer patient to DME vendor Home Health: Wound #3 Midline Back: Continue Home Health Visits - [facility] Nurse may visit PRN to address patient s wound care needs. FACE TO FACE ENCOUNTER: MEDICARE and MEDICAID PATIENTS: I certify that this patient is under my care and that I had a face-to-face encounter that meets the physician face-to-face encounter requirements with this patient on this  date. The encounter with the patient was in whole or in part for the following MEDICAL CONDITION: (primary reason for [place]) MEDICAL NECESSITY: I certify, that based on my findings, NURSING services are a medically necessary home health service. HOME BOUND STATUS: I certify that my clinical findings support that this patient is homebound (i.e., Due to illness or injury, pt requires aid of supportive devices such as crutches, cane, wheelchairs, walkers, the use of special transportation or the assistance of another person to leave their place of residence. There is a normal inability to leave the home and doing so requires considerable and taxing effort. Other absences are for medical reasons / religious services and are infrequent or of short duration when for other reasons). If current dressing causes regression in wound condition, may D/C ordered dressing product/s and apply Normal Saline Moist Dressing daily until next [place] / Other MD appointment. Notify Wound Serda, Almeta [name]. ([id number]) [place] of regression in wound condition at [phone]. Please direct any NON-WOUND related issues/requests for orders to patient's  Primary Care Physician Wound #7 Right Ischial Tuberosity: [place] Visits - [facility] Nurse may visit PRN to address patient s wound care needs. FACE TO FACE ENCOUNTER: MEDICARE and MEDICAID PATIENTS: I certify that this patient is under my care and that I had a face-to-face encounter that meets the physician face-to-face encounter requirements with this patient on this date. The encounter with the patient was in whole or in part for the following MEDICAL CONDITION: (primary reason for [place]) MEDICAL NECESSITY: I certify, that based on my findings, NURSING services are a medically necessary home health service. HOME BOUND STATUS: I certify that my clinical findings support that this patient is homebound (i.e., Due to illness or injury, pt requires aid of supportive devices such as crutches, cane, wheelchairs, walkers, the use of special transportation or the assistance of another person to leave their place of residence. There is a normal inability to leave the home and doing so requires considerable and taxing effort. Other absences are for medical reasons / religious services and are infrequent or of short duration when for other reasons). If current dressing causes regression in wound condition, may D/C ordered dressing product/s and apply Normal Saline Moist Dressing daily until next [place] / Other MD appointment. [place] of regression in wound condition at [phone]. Please direct any NON-WOUND related issues/requests for orders to patient's Primary Care Physician I have reiterated that she needs: 1. The air mattress and they need to actively pursue it with the vendor. 2. Vitamin C, zinc and a multivitamin to be included in her diet in addition to high protein diet 3. Use Sorbact with hydrogel on the thoracic spine wound and cover it with a bordered foam 4. Use Santyl on the right hip 5. Continue to use heel protectors  on both heels 6. Actively work on giving up smoking and we have discussed this at length including the various methodologies. I don't believe the patient is interested in giving up smoking. she will come back to see me next week and we have also discussed the possibility of scheduling her for the appointment specially around the Christmas holiday. Electronic Signature(s) Signed: 08/22/2015 3:42:53 PM By: Christin Fudge MD, FACS Entered By: Christin Fudge on 08/22/2015 15:42:53 [place], Marialy [name]. ([id number]) -------------------------------------------------------------------------------- SuperBill Details Patient Name: [name], Claire [name]. Date of Service: 08/22/2015 Medical Record Number: [id number] Patient Account Number: [id number] Date of Birth/Sex: 31-Jul-1946 (  69 y.o. 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, stage 3 Facility Procedures CPT4 Code: [id number] Description: [id number] - DEB SUBQ TISSUE 20 SQ CM/< ICD-10 Description Diagnosis 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, stage 3 L89.100 Pressure ulcer of unspecified part of back, unst L89.213 Pressure ulcer of  right hip, stage 3 Modifier: ageable Quantity: 1 Physician Procedures CPT4 Code: [id number] Description: [id number] - WC PHYS SUBQ TISS 20 SQ CM ICD-10 Description Diagnosis 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

## 2015-08-27 NOTE — Progress Notes (Signed)
CLINESMITH, Noelle H. ([id number]) Visit Report for 08/22/2015 Arrival Information Details Patient Name: [name], [name] Date of Service: 08/22/2015 2:15 PM Medical Record Number: [id number] Patient Account Number: [id number] Date of Birth/Sex: [birth year] (69 y.o. Female) Treating RN: Carolyne Fiscal, Debi Primary Care Physician: Paulita Cradle Other Clinician: Referring Physician: Paulita Cradle Treating Physician/Extender: Frann Rider in Treatment: 23 Visit Information History Since Last Visit All ordered tests and consults were completed: No Patient Arrived: Wheel Chair Added or deleted any medications: No Arrival Time: 14:37 Any new allergies or adverse reactions: No Accompanied By: HUSBAND Had a fall or experienced change in No Transfer Assistance: EasyPivot activities of daily living that may affect Patient Lift risk of falls: Patient Identification Verified: Yes Signs or symptoms of abuse/neglect since last No Secondary Verification Process Yes visito Completed: Hospitalized since last visit: No Patient Requires Transmission- No Pain Present Now: Yes Based Precautions: Patient Has Alerts: No Electronic Signature(s) Signed: 08/26/2015 3:59:50 PM By: Alric Quan Entered By: Alric Quan on 08/22/2015 14:38:30 [name], [name] ([id number]) -------------------------------------------------------------------------------- Encounter Discharge Information Details Patient Name: [name], [name] Date of Service: 08/22/2015 2:15 PM Medical Record Number: [id number] Patient Account Number: [id number] Date of Birth/Sex: 1946/08/03 (69 y.o. Female) Treating RN: Ahmed Prima Primary Care Physician: Paulita Cradle Other Clinician: Referring Physician: Paulita Cradle Treating Physician/Extender: Frann Rider in Treatment: 70 Encounter Discharge Information Items Discharge Pain Level: 4 Discharge Condition: Stable Ambulatory Status: Walker Discharge  Destination: Home Transportation: Private Auto Accompanied By: Olam Idler Schedule Follow-up Appointment: Yes Medication Reconciliation completed and provided to Patient/Care Yes Stephaniemarie Stoffel: Provided on Clinical Summary of Care: 08/22/2015 Form Type Recipient Paper Patient AS Electronic Signature(s) Signed: 08/22/2015 3:27:15 PM By: Ruthine Dose Entered By: Ruthine Dose on 08/22/2015 15:27:15 [name], [name] ([id number]) -------------------------------------------------------------------------------- Lower Extremity Assessment Details Patient Name: [name], [name] Date of Service: 08/22/2015 2:15 PM Medical Record Number: [id number] Patient Account Number: [id number] Date of Birth/Sex: 1946-03-23 (69 y.o. Female) Treating RN: Carolyne Fiscal, Debi Primary Care Physician: Paulita Cradle Other Clinician: Referring Physician: Paulita Cradle Treating Physician/Extender: Frann Rider in Treatment: 23 Vascular Assessment Pulses: Posterior Tibial Dorsalis Pedis Palpable: [Right:Yes] Extremity colors, hair growth, and conditions: Extremity Color: [Right:Normal] Hair Growth on Extremity: [Right:No] Temperature of Extremity: [Right:Warm] Capillary Refill: [Right:< 3 seconds] Toe Nail Assessment Left: Right: Thick: No Discolored: No Deformed: No Improper Length and Hygiene: No Electronic Signature(s) Signed: 08/26/2015 3:59:50 PM By: Alric Quan Entered By: Alric Quan on 08/22/2015 14:43:05 [name], [name] ([id number]) -------------------------------------------------------------------------------- Multi Wound Chart Details Patient Name: [name], [name] Date of Service: 08/22/2015 2:15 PM Medical Record Number: [id number] Patient Account Number: [id number] Date of Birth/Sex: April 30, 1946 (69 y.o. 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] [7:Pressure Ulcer] [N/A:N/A] Comorbid History: [3:Cataracts, Asthma, Hypertension, Type II Diabetes, Neuropathy, Received Chemotherapy] [7:Cataracts, Asthma, Hypertension, Type II Diabetes, Neuropathy, Received Chemotherapy] [N/A:N/A] Date Acquired: [3:05/02/2015] [7:08/01/2015] [N/A:N/A] Weeks of Treatment: [3:16] [7:2] [N/A:N/A] Wound Status: [3:Open] [7:Open] [N/A:N/A] Measurements L x W x D 2x1.1x0.2 [7:0.8x2.4x0.2] [N/A:N/A] (cm) Area (cm) : [3:1.728] [7:1.508] [N/A:N/A] Volume (cm) : [3:0.346] [7:0.302] [N/A:N/A] % Reduction in Area: [3:-685.50%] [7:52.00%] [N/A:N/A] % Reduction in Volume: -1472.70% [  7:3.80%] [N/A:N/A] Classification: [3:Category/Stage II] [7:Category/Stage III] [N/A:N/A] Exudate Amount: [3:Medium] [7:Medium] [N/A:N/A] Exudate Type: [3:Serous] [7:Serosanguineous] [N/A:N/A] Exudate Color: [3:amber] [7:red, brown] [N/A:N/A] Wound Margin: [3:Distinct, outline attached] [7:Distinct, outline attached] [N/A:N/A] Granulation Amount: [3:Large (67-100%)] [7:None Present (0%)] [N/A:N/A] Granulation Quality: [3:Red, Pink] [7:N/A] [N/A:N/A] Necrotic Amount: [3:None Present (0%)] [7:Large (67-100%)] [N/A:N/A] Exposed Structures: [3:Fascia: No Fat: No Tendon: No Muscle: No Joint: No] [7:Fascia: No Fat: No Tendon: No Muscle: No Joint: No] [N/A:N/A] Bone: No Bone: No Limited to Skin Limited to Skin Breakdown Breakdown Epithelialization: None None N/A Periwound Skin Texture: Edema: Yes Edema: Yes N/A Excoriation: No Excoriation:  No Induration: No Induration: No Callus: No Callus: No Crepitus: No Crepitus: No Fluctuance: No Fluctuance: No Friable: No Friable: No Rash: No Rash: No Scarring: No Scarring: No Periwound Skin Moist: Yes Maceration: Yes N/A Moisture: Maceration: No Moist: Yes Dry/Scaly: No Dry/Scaly: No Periwound Skin Color: Atrophie Blanche: No Atrophie Blanche: No N/A Cyanosis: No Cyanosis: No Ecchymosis: No Ecchymosis: No Erythema: No Erythema: No Hemosiderin Staining: No Hemosiderin Staining: No Mottled: No Mottled: No Pallor: No Pallor: No Rubor: No Rubor: No Temperature: No Abnormality No Abnormality N/A Tenderness on Yes No N/A Palpation: Wound Preparation: Ulcer Cleansing: Ulcer Cleansing: N/A Rinsed/Irrigated with Rinsed/Irrigated with Saline Saline Topical Anesthetic Topical Anesthetic Applied: Other: lidocaine Applied: Other: lidocaine 4% 4% Treatment Notes Electronic Signature(s) Signed: 08/26/2015 3:59:50 PM By: Alric Quan Entered By: Alric Quan on 08/22/2015 14:53:24 [name], [name] ([id number]) -------------------------------------------------------------------------------- Multi-Disciplinary Care Plan Details Patient Name: [name], Labella H. Date of Service: 08/22/2015 2:15 PM Medical Record Number: [id number] Patient Account Number: [id number] Date of Birth/Sex: Feb 01, 1946 (69 y.o. Female) Treating RN: Carolyne Fiscal, Debi Primary Care Physician: Paulita Cradle Other Clinician: Referring Physician: Paulita Cradle Treating Physician/Extender: Frann Rider in Treatment: 39 Active Inactive Abuse / Safety / Falls / Self Care Management Nursing Diagnoses: Impaired home maintenance Impaired physical mobility Knowledge deficit related to: safety; personal, health (wound), emergency Potential for falls Self care deficit: actual or potential Goals: Patient will remain injury free Date Initiated: 03/13/2015 Goal Status:  Active Patient/caregiver will verbalize understanding of skin care regimen Date Initiated: 03/13/2015 Goal Status: Active Patient/caregiver will verbalize/demonstrate measure taken to improve self care Date Initiated: 03/13/2015 Goal Status: Active Patient/caregiver will verbalize/demonstrate measures taken to improve the patient's personal safety Date Initiated: 03/13/2015 Goal Status: Active Patient/caregiver will verbalize/demonstrate measures taken to prevent injury and/or falls Date Initiated: 03/13/2015 Goal Status: Active Patient/caregiver will verbalize/demonstrate understanding of what to do in case of emergency Date Initiated: 03/13/2015 Goal Status: Active Interventions: Assess fall risk on admission and as needed Assess: immobility, friction, shearing, incontinence upon admission and as needed Assess impairment of mobility on admission and as needed per policy Assess self care needs on admission and as needed Provide education on basic hygiene [name], [facility]. ([id number]) Provide education on fall prevention Provide education on personal and home safety Provide education on safe transfers Treatment Activities: Education provided on Basic Hygiene : 03/13/2015 Notes: Orientation to the Wound Care Program Nursing Diagnoses: Knowledge deficit related to the wound healing center program Goals: Patient/caregiver will verbalize understanding of the [facility] Date Initiated: 03/13/2015 Goal Status: Active Interventions: Provide education on orientation to the wound center Notes: Pressure Nursing Diagnoses: Knowledge deficit related to causes and risk factors for pressure ulcer development Knowledge deficit related to management of pressures ulcers Potential for impaired tissue integrity related to pressure, friction, moisture, and shear Goals: Patient will remain free from development of additional pressure ulcers Date  Initiated: 03/13/2015 Goal Status:  Active Patient will remain free of pressure ulcers Date Initiated: 03/13/2015 Goal Status: Active Patient/caregiver will verbalize risk factors for pressure ulcer development Date Initiated: 03/13/2015 Goal Status: Active Patient/caregiver will verbalize understanding of pressure ulcer management Date Initiated: 03/13/2015 Goal Status: Active Interventions: Assess: immobility, friction, shearing, incontinence upon admission and as needed [name], [name] ([id number]) Assess offloading mechanisms upon admission and as needed Assess potential for pressure ulcer upon admission and as needed Provide education on pressure ulcers Treatment Activities: Patient referred for home evaluation of offloading devices/mattresses : 08/22/2015 Patient referred for pressure reduction/relief devices : 08/22/2015 Patient referred for seating evaluation to ensure proper offloading : 08/22/2015 Pressure reduction/relief device ordered : 08/22/2015 Test ordered outside of clinic : 08/22/2015 Notes: Wound/Skin Impairment Nursing Diagnoses: Impaired tissue integrity Knowledge deficit related to ulceration/compromised skin integrity Goals: Patient/caregiver will verbalize understanding of skin care regimen Date Initiated: 03/13/2015 Goal Status: Active Ulcer/skin breakdown will have a volume reduction of 30% by week 4 Date Initiated: 03/13/2015 Goal Status: Active Ulcer/skin breakdown will have a volume reduction of 50% by week 8 Date Initiated: 03/13/2015 Goal Status: Active Ulcer/skin breakdown will have a volume reduction of 80% by week 12 Date Initiated: 03/13/2015 Goal Status: Active Ulcer/skin breakdown will heal within 14 weeks Date Initiated: 03/13/2015 Goal Status: Active Interventions: Assess patient/caregiver ability to obtain necessary supplies Assess patient/caregiver ability to perform ulcer/skin care regimen upon admission and as needed Assess ulceration(s) every visit Provide education on  smoking Provide education on ulcer and skin care Treatment Activities: Patient referred to home care : 08/22/2015 [name], [place]. ([id number]) Referred to DME Laquinn Shippy for dressing supplies : 08/22/2015 Skin care regimen initiated : 08/22/2015 Topical wound management initiated : 08/22/2015 Notes: Electronic Signature(s) Signed: 08/26/2015 3:59:50 PM By: Alric Quan Entered By: Alric Quan on 08/22/2015 14:53:16 [name], [name] ([id number]) -------------------------------------------------------------------------------- Pain Assessment Details Patient Name: [name], [name] Date of Service: 08/22/2015 2:15 PM Medical Record Number: [id number] Patient Account Number: [id number] Date of Birth/Sex: November 04, 1945 (69 y.o. Female) Treating RN: Carolyne Fiscal, Debi Primary Care Physician: Paulita Cradle Other Clinician: Referring Physician: Paulita Cradle Treating Physician/Extender: Frann Rider in Treatment: 23 Active Problems Location of Pain Severity and Description of Pain Patient Has Paino Yes Site Locations Pain Location: Pain in Ulcers With Dressing Change: Yes Duration of the Pain. Constant / Intermittento Constant Rate the pain. Current Pain Level: 8 Worst Pain Level: 10 Least Pain Level: 4 Character of Pain Describe the Pain: Stabbing, Throbbing Pain Management and Medication Current Pain Management: Electronic Signature(s) Signed: 08/26/2015 3:59:50 PM By: Alric Quan Entered By: Alric Quan on 08/22/2015 14:38:52 [name], [name] H. ([id number]) -------------------------------------------------------------------------------- Patient/Caregiver Education Details Patient Name: [name], Tiffany H. Date of Service: 08/22/2015 2:15 PM Medical Record Number: [id number] Patient Account Number: [id number] Date of Birth/Gender: 08-Oct-1945 (69 y.o. Female) Treating RN: Carolyne Fiscal, Debi Primary Care Physician: Paulita Cradle Other Clinician: Referring  Physician: Paulita Cradle Treating Physician/Extender: Frann Rider in Treatment: 56 Education Assessment Education Provided To: Patient and Caregiver Education Topics Provided Wound/Skin Impairment: Handouts: Other: CHANGE DRESSING AS DIRECTED Methods: Demonstration, Explain/Verbal Responses: State content correctly Electronic Signature(s) Signed: 08/26/2015 3:59:50 PM By: Alric Quan Entered By: Alric Quan on 08/22/2015 15:06:27 Morr, Raysha H. ([id number]) -------------------------------------------------------------------------------- Wound Assessment Details Patient Name: Forlenza, Perina H. Date of Service: 08/22/2015 2:15 PM Medical Record Number: [id number] Patient Account Number: [id number] Date of Birth/Sex: 08/05/1946 (69 y.o. Female) Treating RN: Carolyne Fiscal, Debi Primary Care Physician: Paulita Cradle Other Clinician: Referring Physician: Paulita Cradle  Treating Physician/Extender: Frann Rider in Treatment: 23 Wound Status Wound Number: 3 Primary Pressure Ulcer Etiology: Wound Location: Back - Midline Wound Open Wounding Event: Pressure Injury Status: Date Acquired: 05/02/2015 Comorbid Cataracts, Asthma, Hypertension, Type Weeks Of Treatment: 16 History: II Diabetes, Neuropathy, Received Clustered Wound: No Chemotherapy Photos Photo Uploaded By: Alric Quan on 08/22/2015 16:43:09 Wound Measurements Length: (cm) 2 Width: (cm) 1.1 Depth: (cm) 0.2 Area: (cm) 1.728 Volume: (cm) 0.346 % Reduction in Area: -685.5% % Reduction in Volume: -1472.7% Epithelialization: None Tunneling: No Undermining: No Wound Description Classification: Category/Stage II Wound Margin: Distinct, outline attached Exudate Amount: Medium Exudate Type: Serous Exudate Color: amber Foul Odor After Cleansing: No Wound Bed Granulation Amount: Large (67-100%) Exposed Structure Granulation Quality: Red, Pink Fascia Exposed: No Necrotic  Amount: None Present (0%) Fat Layer Exposed: No Tendon Exposed: No Lippy, Lizmary H. ([id number]) Muscle Exposed: No Joint Exposed: No Bone Exposed: No Limited to Skin Breakdown Periwound Skin Texture Texture Color No Abnormalities Noted: No No Abnormalities Noted: No Callus: No Atrophie Blanche: No Crepitus: No Cyanosis: No Excoriation: No Ecchymosis: No Fluctuance: No Erythema: No Friable: No Hemosiderin Staining: No Induration: No Mottled: No Localized Edema: Yes Pallor: No Rash: No Rubor: No Scarring: No Temperature / Pain Moisture Temperature: No Abnormality No Abnormalities Noted: No Tenderness on Palpation: Yes Dry / Scaly: No Maceration: No Moist: Yes Wound Preparation Ulcer Cleansing: Rinsed/Irrigated with Saline Topical Anesthetic Applied: Other: lidocaine 4%, Treatment Notes Wound #3 (Midline Back) 1. Cleansed with: Clean wound with Normal Saline 2. Anesthetic Topical Lidocaine 4% cream to wound bed prior to debridement 4. Dressing Applied: Santyl Ointment Other dressing (specify in notes) 5. Secondary Dressing Applied Bordered Foam Dressing Notes sorbact Electronic Signature(s) Signed: 08/26/2015 3:59:50 PM By: Alric Quan Entered By: Alric Quan on 08/22/2015 14:52:10 Jambor, Nakeda H. ([id number]) -------------------------------------------------------------------------------- Wound Assessment Details Patient Name: Charland, Arthur H. Date of Service: 08/22/2015 2:15 PM Medical Record Number: [id number] Patient Account Number: [id number] Date of Birth/Sex: 12-21-1945 (69 y.o. 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 Wound Open Wounding Event: Pressure Injury Status: Date  Acquired: 08/01/2015 Comorbid Cataracts, Asthma, Hypertension, Type Weeks Of Treatment: 2 History: II Diabetes, Neuropathy, Received Clustered Wound: No Chemotherapy Photos Photo Uploaded By: Alric Quan on 08/22/2015 16:43:31 Wound Measurements Length: (cm) 0.8 Width: (cm) 2.4 Depth: (cm) 0.2 Area: (cm) 1.508 Volume: (cm) 0.302 % Reduction in Area: 52% % Reduction in Volume: 3.8% Epithelialization: None Tunneling: No Undermining: No Wound Description Classification: Category/Stage III Wound Margin: Distinct, outline attached Exudate Amount: Medium Exudate Type: Serosanguineous Exudate Color: red, brown Foul Odor After Cleansing: No Wound Bed Granulation Amount: None Present (0%) Exposed Structure Necrotic Amount: Large (67-100%) Fascia Exposed: No Necrotic Quality: Adherent Slough Fat Layer Exposed: No Tendon Exposed: No Ruddell, Marcella H. ([id number]) Muscle Exposed: No Joint Exposed: No Bone Exposed: No Limited to Skin Breakdown Periwound Skin Texture Texture Color No Abnormalities Noted: No No Abnormalities Noted: No Callus: No Atrophie Blanche: No Crepitus: No Cyanosis: No Excoriation: No Ecchymosis: No Fluctuance: No Erythema: No Friable: No Hemosiderin Staining: No Induration: No Mottled: No Localized Edema: Yes Pallor: No Rash: No Rubor: No Scarring: No Temperature / Pain Moisture Temperature: No Abnormality No Abnormalities Noted: No Dry / Scaly: No Maceration: Yes Moist: Yes Wound Preparation Ulcer Cleansing: Rinsed/Irrigated with Saline Topical Anesthetic Applied: Other: lidocaine  4%, Treatment Notes Wound #7 (Right Ischial Tuberosity) 1. Cleansed with: Clean wound with Normal Saline 2. Anesthetic Topical Lidocaine 4% cream to wound bed prior to debridement 4. Dressing Applied: Santyl Ointment Other dressing (specify in notes) 5. Secondary Dressing Applied Bordered Foam Dressing Notes sorbact Electronic  Signature(s) Signed: 08/26/2015 3:59:50 PM By: Alric Quan Entered By: Alric Quan on 08/22/2015 14:53:06 Tullier, Karista H. ([id number]) -------------------------------------------------------------------------------- Vitals Details Patient Name: [name], Kylin H. Date of Service: 08/22/2015 2:15 PM Medical Record Number: [id number] Patient Account Number: [id number] Date of Birth/Sex: April 10, 1946 (69 y.o. Female) Treating RN: Carolyne Fiscal, Debi Primary Care Physician: Paulita Cradle Other Clinician: Referring Physician: Paulita Cradle Treating Physician/Extender: Frann Rider in Treatment: 23 Vital Signs Time Taken: 14:39 Temperature (F): 97.9 Height (in): 65 Pulse (bpm): 91 Weight (lbs): 118 Respiratory Rate (breaths/min): 18 Body Mass Index (BMI): 19.6 Blood Pressure (mmHg): 102/52 Reference Range: 80 - 120 mg / dl Electronic Signature(s) Signed: 08/26/2015 3:59:50 PM By: Alric Quan Entered By: Alric Quan on 08/22/2015 14:41:03

## 2015-08-29 ENCOUNTER — Ambulatory Visit: Payer: Medicare Other | Admitting: Surgery

## 2015-09-04 ENCOUNTER — Encounter: Payer: Medicare Other | Admitting: Surgery

## 2015-09-04 DIAGNOSIS — E11621 Type 2 diabetes mellitus with foot ulcer: Secondary | ICD-10-CM | POA: Diagnosis not present

## 2015-09-05 NOTE — Progress Notes (Signed)
Emy, Tarantino Arianis Lemmie [name] ([id number]) Visit Report for 09/04/2015 Chief Complaint Document Details Patient Name: [name], [name] 09/04/2015 2:15 Date of Service: PM Medical Record [id number] Number: Patient Account Number: [id number] [birth year] (69 y.o. Treating RN: Cornell Barman Date of Birth/Sex: Female) Other Clinician: Primary Care Physician: Carrie Mew, MIRIAM Treating Christin Fudge Referring Physician: Paulita Cradle Physician/Extender: Suella Grove in Treatment: 25 Information Obtained from: Patient Chief Complaint Patient presents to the wound care center for a consult due non healing wound. Ulcers on the right elbow and the right heel for about 1 month. Electronic Signature(s) Signed: 09/04/2015 2:43:18 PM By: Christin Fudge MD, FACS Entered By: Christin Fudge on 09/04/2015 14:43:18 Surges, Laverta HMarland Kitchen ([id number]) -------------------------------------------------------------------------------- HPI Details Patient Name: Mckenzie Spurr. 09/04/2015 2:15 Date of Service: PM Medical Record [id number] Number: Patient Account Number: [id number] 1946-06-23 (69 y.o. Treating RN: Cornell Barman Date of Birth/Sex: Female) Other Clinician: Primary Care Physician: Carrie Mew, MIRIAM Treating Christin Fudge Referring Physician: Paulita Cradle Physician/Extender: Suella Grove in Treatment: 25 History of Present Illness Location: Ulceration on the right heel and the right elbow. Quality: Patient reports experiencing a dull pain to affected area(s). Severity: Patient states wound (s) are getting better. Duration: Patient has had the wound for < 4 weeks prior to presenting for treatment Timing: Pain in wound is Intermittent (comes and goes Context: The wound appeared gradually over time Modifying Factors: Consults to this date include:Augmentin and Bactrim and also some heel protection with duoderm Associated Signs and Symptoms: Patient reports having difficulty standing for long periods. HPI Description:  69 year old female with history of peripheral neuropathy, history of diet controlled diabetes mellitus type 2, history of alcoholism here for wound consult sent by her PCP Dr. Sherilyn Cooter. She has pressure ulcers at her right elbow, bilateral heels. Plain films of right calcaneus without acute bony process. Patient started by PCP on Augmentin, Bactrim as per orders, DuoDerm dressings applied - reports some improvement in her ulcer since last seen. Denies fever, chills, nausea, vomiting, diarrhea. She had a right humerus fracture in the middle of May and has had no surgery and arm is in a sling. She is also been laying in the bed for quite a while. Past medical history significant for essential hypertension, osteoporosis, peripheral neuropathy, alcoholism, ataxia, personal history of breast cancer treated with surgery chemotherapy and radiation and this was done in December 2010. she is also status post laparoscopic cholecystectomy, pilonidal cyst excision, subcutaneous port placement, partial mastectomy on the left side, skin cancer removal. 03/21/2015 -- she says overall she's been doing better and continues to smoke about 15 cigarettes a day. 03/21/2015 - her orthopedic doctor has said she may require surgery for her right humerus fracture. 04/04/2015 -- her orthopedic surgery has been scheduled for August 11. 04/18/2015 -- she is doing fine as far as her elbow and her right heel goes but she has developed some redness over prominence on her thoracic spine and wanted me to take a look at this. 05/02/2015 -- she had her surgery done and now is in a sling and support. Her back has developed a pressure injury of undetermined stage. She seems to be in better spirits. 05/16/2015 -- last week her right heel was looking great and we had healed it out, but she has not been offloading appropriately and has a deep tissue injury on the right heel again. The area on her right elbow has opened out with slough and  the area in the thoracic spine is also getting worse. 05/30/2015 -- she has developed  2 new ulcerations one on her left ischial tuberosity and one on the sacral region. She is still working on getting up smoking but is also unable to take her vitamins as she says she [name], [name] ([id number]) develops a diarrhea when she takes vitamins. She has increased her intake of proteins. 06/06/2015 -- the patient's husband manages to get her a low air loss mattress with initiating pressure but did not know how to use it exactly and the patient was not happy about using it. Overall she says she's been feeling better. 07/11/2015 -- . Discussed a surgical opinion for debridement and application of a wound VAC, 2 weeks ago but the patient has been reluctant to get a surgical opinion as she wants to avoid surgery. 07/18/2015 -- they have an appointment to see Dr. Tamala Julian this coming Wednesday. 07/29/2015 -- they saw Dr. Tamala Julian in his office and he did a debridement of the wound and removed significant amount of slough. This is in addition to the debridement I had done previously on Friday where a large amount of the eschar was removed. He did not recommend the application of wound VAC. Addendum : Dr. Thompson Caul note was reviewed via EPIC and details noted as above. 08/05/2015 -- over the last week she has developed a another pressure injury to her right hip and has had significant discoloration of the skin and an eschar there. 08/15/2015 -- she is still smoking about a pack of cigarettes a day and does not seem to want to quit. They have not been able to talk to the vendor regarding the air mattress and I will ask them to get in touch again. She is reluctant to take vitamins and does admit her nutrition is poor. 08/22/2015 -- she has been unable to tolerate her vitamins and continues to smoke. They're working on getting a low air loss mattress and have been speaking with the vendor. Electronic  Signature(s) Signed: 09/04/2015 2:43:23 PM By: Christin Fudge MD, FACS Entered By: Christin Fudge on 09/04/2015 14:43:23 [name], [name] ([id number]) -------------------------------------------------------------------------------- Physical Exam Details Patient Name: Mckenzie Spurr. 09/04/2015 2:15 Date of Service: PM Medical Record [id number] Number: Patient Account Number: [id number] 1945-11-15 (69 y.o. Treating RN: Cornell Barman Date of Birth/Sex: Female) Other Clinician: Primary Care Physician: Carrie Mew, MIRIAM Treating Christin Fudge Referring Physician: Paulita Cradle Physician/Extender: Weeks in Treatment: 25 Constitutional . Pulse regular. Respirations normal and unlabored. Afebrile. . Eyes Nonicteric. Reactive to light. Ears, Nose, Mouth, and Throat Lips, teeth, and gums WNL.Marland Kitchen Moist mucosa without lesions. Neck supple and nontender. No palpable supraclavicular or cervical adenopathy. Normal sized without goiter. Respiratory WNL. No retractions.. Cardiovascular Pedal Pulses WNL. No clubbing, cyanosis or edema. Lymphatic No adneopathy. No adenopathy. No adenopathy. Musculoskeletal Adexa without tenderness or enlargement.. Digits and nails w/o clubbing, cyanosis, infection, petechiae, ischemia, or inflammatory conditions.. Integumentary (Hair, Skin) No suspicious lesions. No crepitus or fluctuance. No peri-wound warmth or erythema. No masses.Marland Kitchen Psychiatric Judgement and insight Intact.. No evidence of depression, anxiety, or agitation.. Notes the thoracic spine wound is looking very good and does not need any debridement. The pressure ulcer on the right hip is now also very clean and will not need central any longer. Electronic Signature(s) Signed: 09/04/2015 2:44:14 PM By: Christin Fudge MD, FACS Entered By: Christin Fudge on 09/04/2015 14:44:13 [name], [name] ([id number]) -------------------------------------------------------------------------------- Physician  Orders Details Patient Name: Mckenzie Spurr. 09/04/2015 2:15 Date of Service: PM Medical Record [id number] Number: Patient Account Number: [id number] 1946-03-02 (69 y.o. Treating RN: Gretta Cool,  Maudie Mercury Date of Birth/Sex: Female) Other Clinician: Primary Care Physician: Carrie Mew, MIRIAM Treating Joelle Flessner Referring Physician: Paulita Cradle Physician/Extender: Suella Grove in Treatment: 25 Verbal / Phone Orders: Yes Clinician: Cornell Barman Read Back and Verified: Yes Diagnosis Coding Wound Cleansing Wound #3 Midline Back o Clean wound with Normal Saline. o May Shower, gently pat wound dry prior to applying new dressing. o No tub bath. Wound #7 Right Ischial Tuberosity o Clean wound with Normal Saline. o May Shower, gently pat wound dry prior to applying new dressing. o No tub bath. Anesthetic Wound #3 Midline Back o Topical Lidocaine 4% cream applied to wound bed prior to debridement Wound #7 Right Ischial Tuberosity o Topical Lidocaine 4% cream applied to wound bed prior to debridement Skin Barriers/Peri-Wound Care Wound #3 Midline Back o Barrier cream Wound #7 Right Ischial Tuberosity o Barrier cream Primary Wound Dressing Wound #3 Midline Back o Prisma Ag Wound #7 Right Ischial Tuberosity o Prisma Ag Secondary Dressing Wound #3 Midline Back Lupinski, Kristeena H. ([id number]) o Boardered Foam Dressing Wound #7 Right Ischial Tuberosity o Boardered Foam Dressing Dressing Change Frequency Wound #3 Midline Back o Change dressing every other day. Wound #7 Right Ischial Tuberosity o Change dressing every other day. Follow-up Appointments Wound #3 Midline Back o Return Appointment in 1 week. Wound #7 Right Ischial Tuberosity o Return Appointment in 1 week. Off-Loading o Turn and reposition every 2 hours Home Health Wound #3 Midline Back o [place] Visits - [facility] Nurse may visit PRN to address patientos  wound care needs. o FACE TO FACE ENCOUNTER: MEDICARE and MEDICAID PATIENTS: I certify that this patient is under my care and that I had a face-to-face encounter that meets the physician face-to-face encounter requirements with this patient on this date. The encounter with the patient was in whole or in part for the following MEDICAL CONDITION: (primary reason for [place]) MEDICAL NECESSITY: I certify, that based on my findings, NURSING services are a medically necessary home health service. HOME BOUND STATUS: I certify that my clinical findings support that this patient is homebound (i.e., Due to illness or injury, pt requires aid of supportive devices such as crutches, cane, wheelchairs, walkers, the use of special transportation or the assistance of another person to leave their place of residence. There is a normal inability to leave the home and doing so requires considerable and taxing effort. Other absences are for medical reasons / religious services and are infrequent or of short duration when for other reasons). o If current dressing causes regression in wound condition, may D/C ordered dressing product/s and apply Normal Saline Moist Dressing daily until next [place] / Other MD appointment. Keo of regression in wound condition at [phone]. o Please direct any NON-WOUND related issues/requests for orders to patient's Primary Care Physician Wound #7 Right Ischial [place] Visits - [place] Nurse may visit PRN to address patientos wound care needs. o FACE TO FACE ENCOUNTER: MEDICARE and MEDICAID PATIENTS: I certify that this patient is under my care and that I had a face-to-face encounter that meets the physician face-to-face [place], [place]. ([id number]) encounter requirements with this patient on this date. The encounter with the patient was in whole or in part for the following MEDICAL  CONDITION: (primary reason for [place]) MEDICAL NECESSITY: I certify, that based on my findings, NURSING services are a medically necessary home health service. HOME BOUND STATUS: I certify  that my clinical findings support that this patient is homebound (i.e., Due to illness or injury, pt requires aid of supportive devices such as crutches, cane, wheelchairs, walkers, the use of special transportation or the assistance of another person to leave their place of residence. There is a normal inability to leave the home and doing so requires considerable and taxing effort. Other absences are for medical reasons / religious services and are infrequent or of short duration when for other reasons). o If current dressing causes regression in wound condition, may D/C ordered dressing product/s and apply Normal Saline Moist Dressing daily until next [place] / Other MD appointment. Sebastian of regression in wound condition at [phone]. o Please direct any NON-WOUND related issues/requests for orders to patient's Primary Care Physician Electronic Signature(s) Signed: 09/04/2015 5:05:23 PM By: Christin Fudge MD, FACS Signed: 09/04/2015 6:21:38 PM By: Gretta Cool RN, BSN, Kim RN, BSN Entered By: Gretta Cool, RN, BSN, Kim on 09/04/2015 14:41:26 Cothern, Anacristina HMarland Kitchen ([id number]) -------------------------------------------------------------------------------- Problem List Details Patient Name: Mckenzie Spurr. 09/04/2015 2:15 Date of Service: PM Medical Record [id number] Number: Patient Account Number: [id number] [birth year] (69 y.o. Treating RN: Cornell Barman Date of Birth/Sex: Female) Other Clinician: Primary Care Physician: Carrie Mew, MIRIAM Treating Yaneisy Wenz Referring Physician: Paulita Cradle Physician/Extender: Suella Grove in Treatment: 25 Active Problems ICD-10 Encounter Code Description Active Date Diagnosis E11.621 Type 2 diabetes mellitus with foot ulcer  03/13/2015 Yes L89.613 Pressure ulcer of right heel, stage 3 03/13/2015 Yes F17.218 Nicotine dependence, cigarettes, with other nicotine- 03/13/2015 Yes induced disorders L89.100 Pressure ulcer of unspecified part of back, unstageable 05/02/2015 Yes L89.213 Pressure ulcer of right hip, stage 3 08/05/2015 Yes Inactive Problems Resolved Problems ICD-10 Code Description Active Date Resolved Date L89.013 Pressure ulcer of right elbow, stage 3 03/13/2015 03/13/2015 [id number] Pressure ulcer of left hip, stage 2 05/30/2015 05/30/2015 L89.153 Pressure ulcer of sacral region, stage 3 05/30/2015 05/30/2015 Sann, Mishaal H. ([id number]) Electronic Signature(s) Signed: 09/04/2015 2:43:10 PM By: Christin Fudge MD, FACS Entered By: Christin Fudge on 09/04/2015 14:43:10 Rodak, Theresia H. ([id number]) -------------------------------------------------------------------------------- Progress Note Details Patient Name: Mckenzie Spurr. 09/04/2015 2:15 Date of Service: PM Medical Record [id number] Number: Patient Account Number: [id number] 11/13/1945 (69 y.o. Treating RN: Cornell Barman Date of Birth/Sex: Female) Other Clinician: Primary Care Physician: Carrie Mew, MIRIAM Treating Christin Fudge Referring Physician: Paulita Cradle Physician/Extender: Suella Grove in Treatment: 25 Subjective Chief Complaint Information obtained from Patient Patient presents to the wound care center for a consult due non healing wound. Ulcers on the right elbow and the right heel for about 1 month. History of Present Illness (HPI) The following HPI elements were documented for the patient's wound: Location: Ulceration on the right heel and the right elbow. Quality: Patient reports experiencing a dull pain to affected area(s). Severity: Patient states wound (s) are getting better. Duration: Patient has had the wound for < 4 weeks prior to presenting for treatment Timing: Pain in wound is Intermittent (comes and goes Context: The wound  appeared gradually over time Modifying Factors: Consults to this date include:Augmentin and Bactrim and also some heel protection with duoderm Associated Signs and Symptoms: Patient reports having difficulty standing for long periods. 69 year old female with history of peripheral neuropathy, history of diet controlled diabetes mellitus type 2, history of alcoholism here for wound consult sent by her PCP Dr. Sherilyn Cooter. She has pressure ulcers at her right elbow, bilateral heels. Plain films of right calcaneus without acute bony process. Patient started by PCP on Augmentin, Bactrim as per  orders, DuoDerm dressings applied - reports some improvement in her ulcer since last seen. Denies fever, chills, nausea, vomiting, diarrhea. She had a right humerus fracture in the middle of May and has had no surgery and arm is in a sling. She is also been laying in the bed for quite a while. Past medical history significant for essential hypertension, osteoporosis, peripheral neuropathy, alcoholism, ataxia, personal history of breast cancer treated with surgery chemotherapy and radiation and this was done in December 2010. she is also status post laparoscopic cholecystectomy, pilonidal cyst excision, subcutaneous port placement, partial mastectomy on the left side, skin cancer removal. 03/21/2015 -- she says overall she's been doing better and continues to smoke about 15 cigarettes a day. 03/21/2015 - her orthopedic doctor has said she may require surgery for her right humerus fracture. 04/04/2015 -- her orthopedic surgery has been scheduled for August 11. 04/18/2015 -- she is doing fine as far as her elbow and her right heel goes but she has developed some Bollen, Keshona H. ([id number]) redness over prominence on her thoracic spine and wanted me to take a look at this. 05/02/2015 -- she had her surgery done and now is in a sling and support. Her back has developed a pressure injury of undetermined stage. She seems  to be in better spirits. 05/16/2015 -- last week her right heel was looking great and we had healed it out, but she has not been offloading appropriately and has a deep tissue injury on the right heel again. The area on her right elbow has opened out with slough and the area in the thoracic spine is also getting worse. 05/30/2015 -- she has developed 2 new ulcerations one on her left ischial tuberosity and one on the sacral region. She is still working on getting up smoking but is also unable to take her vitamins as she says she develops a diarrhea when she takes vitamins. She has increased her intake of proteins. 06/06/2015 -- the patient's husband manages to get her a low air loss mattress with initiating pressure but did not know how to use it exactly and the patient was not happy about using it. Overall she says she's been feeling better. 07/11/2015 -- . Discussed a surgical opinion for debridement and application of a wound VAC, 2 weeks ago but the patient has been reluctant to get a surgical opinion as she wants to avoid surgery. 07/18/2015 -- they have an appointment to see Dr. Tamala Julian this coming Wednesday. 07/29/2015 -- they saw Dr. Tamala Julian in his office and he did a debridement of the wound and removed significant amount of slough. This is in addition to the debridement I had done previously on Friday where a large amount of the eschar was removed. He did not recommend the application of wound VAC. Addendum : Dr. Thompson Caul note was reviewed via EPIC and details noted as above. 08/05/2015 -- over the last week she has developed a another pressure injury to her right hip and has had significant discoloration of the skin and an eschar there. 08/15/2015 -- she is still smoking about a pack of cigarettes a day and does not seem to want to quit. They have not been able to talk to the vendor regarding the air mattress and I will ask them to get in touch again. She is reluctant to take vitamins and  does admit her nutrition is poor. 08/22/2015 -- she has been unable to tolerate her vitamins and continues to smoke. They're working on getting a  low air loss mattress and have been speaking with the vendor. Objective Constitutional Pulse regular. Respirations normal and unlabored. Afebrile. Vitals Time Taken: 2:24 PM, Height: 65 in, Weight: 118 lbs, BMI: 19.6, Temperature: 97.7 F, Pulse: 89 bpm, Respiratory Rate: 18 breaths/min, Blood Pressure: 98/83 mmHg. Eyes Nonicteric. Reactive to light. Ears, Nose, Mouth, and Throat Lips, teeth, and gums WNL.Marland Kitchen Moist mucosa without lesions. Neck Pineau, Tocara H. ([id number]) supple and nontender. No palpable supraclavicular or cervical adenopathy. Normal sized without goiter. Respiratory WNL. No retractions.. Cardiovascular Pedal Pulses WNL. No clubbing, cyanosis or edema. Lymphatic No adneopathy. No adenopathy. No adenopathy. Musculoskeletal Adexa without tenderness or enlargement.. Digits and nails w/o clubbing, cyanosis, infection, petechiae, ischemia, or inflammatory conditions.Marland Kitchen Psychiatric Judgement and insight Intact.. No evidence of depression, anxiety, or agitation.. General Notes: the thoracic spine wound is looking very good and does not need any debridement. The pressure ulcer on the right hip is now also very clean and will not need central any longer. Integumentary (Hair, Skin) No suspicious lesions. No crepitus or fluctuance. No peri-wound warmth or erythema. No masses.. Wound #3 status is Open. Original cause of wound was Pressure Injury. The wound is located on the Midline Back. The wound measures 1cm length x 0.5cm width x 0.1cm depth; 0.393cm^2 area and 0.039cm^3 volume. The wound is limited to skin breakdown. There is a medium amount of serous drainage noted. The wound margin is distinct with the outline attached to the wound base. There is large (67-100%) red, pink granulation within the wound bed. There is no necrotic  tissue within the wound bed. The periwound skin appearance exhibited: Localized Edema, Moist. The periwound skin appearance did not exhibit: Callus, Crepitus, Excoriation, Fluctuance, Friable, Induration, Rash, Scarring, Dry/Scaly, Maceration, Atrophie Blanche, Cyanosis, Ecchymosis, Hemosiderin Staining, Mottled, Pallor, Rubor, Erythema. Periwound temperature was noted as No Abnormality. The periwound has tenderness on palpation. Wound #7 status is Open. Original cause of wound was Pressure Injury. The wound is located on the Right Ischial Tuberosity. The wound measures 0.8cm length x 1.2cm width x 0.1cm depth; 0.754cm^2 area and 0.075cm^3 volume. The wound is limited to skin breakdown. There is a medium amount of serosanguineous drainage noted. The wound margin is distinct with the outline attached to the wound base. There is large (67-100%) pink granulation within the wound bed. There is a small (1-33%) amount of necrotic tissue within the wound bed including Adherent Slough. The periwound skin appearance exhibited: Localized Edema, Moist. The periwound skin appearance did not exhibit: Callus, Crepitus, Excoriation, Fluctuance, Friable, Induration, Rash, Scarring, Dry/Scaly, Maceration, Atrophie Blanche, Cyanosis, Ecchymosis, Hemosiderin Staining, Mottled, Pallor, Rubor, Erythema. Periwound temperature was noted as No Abnormality. Assessment [name], [name] ([id number]) Active Problems ICD-10 E11.621 - Type 2 diabetes mellitus with foot ulcer L89.613 - Pressure ulcer of right heel, stage 3 F17.218 - Nicotine dependence, cigarettes, with other nicotine-induced disorders L89.100 - Pressure ulcer of unspecified part of back, unstageable L89.213 - Pressure ulcer of right hip, stage 3 I have recommended that she needs: 1. The air mattress and they have received this from the vendor. 2. Vitamin C, zinc and a multivitamin to be included in her diet in addition to high protein diet 3. Use Prisma  Ag thoracic spine wound and cover it with a bordered foam 4. Use Prisma Ag on the right hip 5. Continue to use heel protectors on both heels 6. Actively work on giving up smoking and we have discussed this at length including the various methodologies. I don't believe the patient  is interested in giving up smoking. she will come back to see me next week and we have also discussed the possibility of scheduling her for the appointment specially around the Christmas holiday. Plan Wound Cleansing: Wound #3 Midline Back: Clean wound with Normal Saline. May Shower, gently pat wound dry prior to applying new dressing. No tub bath. Wound #7 Right Ischial Tuberosity: Clean wound with Normal Saline. May Shower, gently pat wound dry prior to applying new dressing. No tub bath. Anesthetic: Wound #3 Midline Back: Topical Lidocaine 4% cream applied to wound bed prior to debridement Wound #7 Right Ischial Tuberosity: Topical Lidocaine 4% cream applied to wound bed prior to debridement Skin Barriers/Peri-Wound Care: Wound #3 Midline Back: Barrier cream Wound #7 Right Ischial Tuberosity: Barrier cream [name], [name] ([id number]) Primary Wound Dressing: Wound #3 Midline Back: Prisma Ag Wound #7 Right Ischial Tuberosity: Prisma Ag Secondary Dressing: Wound #3 Midline Back: Boardered Foam Dressing Wound #7 Right Ischial Tuberosity: Boardered Foam Dressing Dressing Change Frequency: Wound #3 Midline Back: Change dressing every other day. Wound #7 Right Ischial Tuberosity: Change dressing every other day. Follow-up Appointments: Wound #3 Midline Back: Return Appointment in 1 week. Wound #7 Right Ischial Tuberosity: Return Appointment in 1 week. Off-Loading: Turn and reposition every 2 hours Home Health: Wound #3 Midline Back: Continue Home Health Visits - [facility] Nurse may visit PRN to address patient s wound care needs. FACE TO FACE ENCOUNTER: MEDICARE and MEDICAID  PATIENTS: I certify that this patient is under my care and that I had a face-to-face encounter that meets the physician face-to-face encounter requirements with this patient on this date. The encounter with the patient was in whole or in part for the following MEDICAL CONDITION: (primary reason for [place]) MEDICAL NECESSITY: I certify, that based on my findings, NURSING services are a medically necessary home health service. HOME BOUND STATUS: I certify that my clinical findings support that this patient is homebound (i.e., Due to illness or injury, pt requires aid of supportive devices such as crutches, cane, wheelchairs, walkers, the use of special transportation or the assistance of another person to leave their place of residence. There is a normal inability to leave the home and doing so requires considerable and taxing effort. Other absences are for medical reasons / religious services and are infrequent or of short duration when for other reasons). If current dressing causes regression in wound condition, may D/C ordered dressing product/s and apply Normal Saline Moist Dressing daily until next [place] / Other MD appointment. [place] of regression in wound condition at [phone]. Please direct any NON-WOUND related issues/requests for orders to patient's Primary Care Physician Wound #7 Right Ischial Tuberosity: [place] Visits - [facility] Nurse may visit PRN to address patient s wound care needs. FACE TO FACE ENCOUNTER: MEDICARE and MEDICAID PATIENTS: I certify that this patient is under my care and that I had a face-to-face encounter that meets the physician face-to-face encounter requirements with this patient on this date. The encounter with the patient was in whole or in part for the following MEDICAL CONDITION: (primary reason for [place]) MEDICAL NECESSITY: I certify, that based on my findings, NURSING  services are a medically necessary home health service. HOME BOUND STATUS: I certify that my clinical findings support that this patient is homebound (i.e., Due to illness or injury, pt requires aid of supportive devices such as crutches, cane, wheelchairs, walkers, the use of special transportation or  the assistance of another person to leave their place of residence. There is a [name], [name] ([id number]) normal inability to leave the home and doing so requires considerable and taxing effort. Other absences are for medical reasons / religious services and are infrequent or of short duration when for other reasons). If current dressing causes regression in wound condition, may D/C ordered dressing product/s and apply Normal Saline Moist Dressing daily until next [place] / Other MD appointment. [place] of regression in wound condition at [phone]. Please direct any NON-WOUND related issues/requests for orders to patient's Primary Care Physician I have recommended that she needs: 1. The air mattress and they have received this from the vendor. 2. Vitamin C, zinc and a multivitamin to be included in her diet in addition to high protein diet 3. Use Prisma Ag thoracic spine wound and cover it with a bordered foam 4. Use Prisma Ag on the right hip 5. Continue to use heel protectors on both heels 6. Actively work on giving up smoking and we have discussed this at length including the various methodologies. I don't believe the patient is interested in giving up smoking. she will come back to see [place] next week. Electronic Signature(s) Signed: 09/04/2015 2:46:27 PM By: Christin Fudge MD, FACS Entered By: Christin Fudge on 09/04/2015 14:46:27 [name], [name] ([id number]) -------------------------------------------------------------------------------- SuperBill Details Patient Name: [name], [name] Date of Service: 09/04/2015 Medical Record Number: [id number] Patient  Account Number: [id number] Date of Birth/Sex: 09-Feb-1946 (69 y.o. 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

## 2015-09-05 NOTE — Progress Notes (Signed)
ALESSI, Wei H. ([id number]) Visit Report for 09/04/2015 Arrival Information Details Patient Name: [name], [name] Date of Service: 09/04/2015 2:15 PM Medical Record Number: [id number] Patient Account Number: [id number] Date of Birth/Sex: July 22, 1946 (69 y.o. Female) Treating RN: Cornell Barman Primary Care Physician: Paulita Cradle Other Clinician: Referring Physician: Paulita Cradle Treating Physician/Extender: Frann Rider in Treatment: 25 Visit Information History Since Last Visit Added or deleted any medications: No Patient Arrived: Wheel Chair Any new allergies or adverse reactions: No Arrival Time: 14:23 Had a fall or experienced change in No activities of daily living that may affect Accompanied By: husband risk of falls: Transfer Assistance: None Signs or symptoms of abuse/neglect since last No Patient Identification Verified: Yes visito Secondary Verification Process Yes Hospitalized since last visit: No Completed: Has Dressing in Place as Prescribed: Yes Patient Requires Transmission-Based No Pain Present Now: Yes Precautions: Patient Has Alerts: No Electronic Signature(s) Signed: 09/04/2015 6:21:38 PM By: Gretta Cool, RN, BSN, Kim RN, BSN Entered By: Gretta Cool, RN, BSN, Kim on 09/04/2015 14:23:56 Speedy, Faustine H. ([id number]) -------------------------------------------------------------------------------- Clinic Level of Care Assessment Details Patient Name: [name], [name] Date of Service: 09/04/2015 2:15 PM Medical Record Number: [id number] Patient Account Number: [id number] Date of Birth/Sex: 1945-11-18 (69 y.o. Female) Treating RN: Cornell Barman Primary Care Physician: Paulita Cradle Other Clinician: Referring Physician: Paulita Cradle Treating Physician/Extender: Frann Rider in Treatment: 25 Clinic Level of Care Assessment Items TOOL 4 Quantity Score []  - Use when only an EandM is performed on FOLLOW-UP visit 0 ASSESSMENTS - Nursing  Assessment / Reassessment X - Reassessment of Co-morbidities (includes updates in patient status) 1 10 X - Reassessment of Adherence to Treatment Plan 1 5 ASSESSMENTS - Wound and Skin Assessment / Reassessment X - Simple Wound Assessment / Reassessment - one wound 1 5 []  - Complex Wound Assessment / Reassessment - multiple wounds 0 []  - Dermatologic / Skin Assessment (not related to wound area) 0 ASSESSMENTS - Focused Assessment []  - Circumferential Edema Measurements - multi extremities 0 []  - Nutritional Assessment / Counseling / Intervention 0 []  - Lower Extremity Assessment (monofilament, tuning fork, pulses) 0 []  - Peripheral Arterial Disease Assessment (using hand held doppler) 0 ASSESSMENTS - Ostomy and/or Continence Assessment and Care []  - Incontinence Assessment and Management 0 []  - Ostomy Care Assessment and Management (repouching, etc.) 0 PROCESS - Coordination of Care X - Simple Patient / Family Education for ongoing care 1 15 []  - Complex (extensive) Patient / Family Education for ongoing care 0 X - Staff obtains Programmer, systems, Records, Test Results / Process Orders 1 10 []  - Staff telephones HHA, Nursing Homes / Clarify orders / etc 0 []  - Routine Transfer to another Facility (non-emergent condition) 0 [name], [name] ([id number]) []  - Routine Hospital Admission (non-emergent condition) 0 []  - New Admissions / Biomedical engineer / Ordering NPWT, Apligraf, etc. 0 []  - Emergency Hospital Admission (emergent condition) 0 X - Simple Discharge Coordination 1 10 []  - Complex (extensive) Discharge Coordination 0 PROCESS - Special Needs []  - Pediatric / Minor Patient Management 0 []  - Isolation Patient Management 0 []  - Hearing / Language / Visual special needs 0 []  - Assessment of Community assistance (transportation, D/C planning, etc.) 0 []  - Additional assistance / Altered mentation 0 []  - Support Surface(s) Assessment (bed, cushion, seat, etc.) 0 INTERVENTIONS - Wound  Cleansing / Measurement []  - Simple Wound Cleansing - one wound 0 X - Complex Wound Cleansing - multiple wounds 2 5 []  - Wound Imaging (photographs - any  number of wounds) 0 X - Wound Tracing (instead of photographs) 1 5 []  - Simple Wound Measurement - one wound 0 X - Complex Wound Measurement - multiple wounds 2 5 INTERVENTIONS - Wound Dressings X - Small Wound Dressing one or multiple wounds 2 10 []  - Medium Wound Dressing one or multiple wounds 0 []  - Large Wound Dressing one or multiple wounds 0 []  - Application of Medications - topical 0 []  - Application of Medications - injection 0 INTERVENTIONS - Miscellaneous []  - External ear exam 0 [name], [name] ([id number]) []  - Specimen Collection (cultures, biopsies, blood, body fluids, etc.) 0 []  - Specimen(s) / Culture(s) sent or taken to Lab for analysis 0 []  - Patient Transfer (multiple staff / Civil Service fast streamer / Similar devices) 0 []  - Simple Staple / Suture removal (25 or less) 0 []  - Complex Staple / Suture removal (26 or more) 0 []  - Hypo / Hyperglycemic Management (close monitor of Blood Glucose) 0 []  - Ankle / Brachial Index (ABI) - do not check if billed separately 0 X - Vital Signs 1 5 Has the patient been seen at the hospital within the last three years: Yes Total Score: 105 Level Of Care: New/Established - Level 3 Electronic Signature(s) Signed: 09/04/2015 6:21:38 PM By: Gretta Cool, RN, BSN, Kim RN, BSN Entered By: Gretta Cool, RN, BSN, Kim on 09/04/2015 14:42:07 [name], [name] ([id number]) -------------------------------------------------------------------------------- Encounter Discharge Information Details Patient Name: [name], [name] Date of Service: 09/04/2015 2:15 PM Medical Record Number: [id number] Patient Account Number: [id number] Date of Birth/Sex: 14-Sep-1945 (69 y.o. Female) Treating RN: Cornell Barman Primary Care Physician: Paulita Cradle Other Clinician: Referring Physician: Paulita Cradle Treating  Physician/Extender: Frann Rider in Treatment: 25 Encounter Discharge Information Items Discharge Pain Level: 0 Discharge Condition: Stable Ambulatory Status: Wheelchair Discharge Destination: Home Transportation: Private Auto Accompanied By: husband Schedule Follow-up Appointment: Yes Medication Reconciliation completed and provided to Patient/Care Yes Marion Seese: Provided on Clinical Summary of Care: 09/04/2015 Form Type Recipient Paper Patient AS Electronic Signature(s) Signed: 09/04/2015 2:52:07 PM By: Ruthine Dose Entered By: Ruthine Dose on 09/04/2015 14:52:07 [name], [name] ([id number]) -------------------------------------------------------------------------------- Multi Wound Chart Details Patient Name: [name], [name] Date of Service: 09/04/2015 2:15 PM Medical Record Number: [id number] Patient Account Number: [id number] Date of Birth/Sex: 12-06-1945 (69 y.o. 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] [7:Pressure Ulcer] [N/A:N/A] Comorbid History: [3:Cataracts, Asthma, Hypertension, Type II Diabetes, Neuropathy, Received Chemotherapy] [7:Cataracts, Asthma, Hypertension, Type II Diabetes, Neuropathy, Received Chemotherapy] [N/A:N/A] Date Acquired: [3:05/02/2015] [7:08/01/2015] [N/A:N/A] Weeks of Treatment: [3:17] [7:4] [N/A:N/A] Wound Status: [3:Open] [7:Open] [N/A:N/A] Measurements L x W x D 1x0.5x0.1  [7:0.8x1.2x0.1] [N/A:N/A] (cm) Area (cm) : [3:0.393] [7:0.754] [N/A:N/A] Volume (cm) : [3:0.039] [7:0.075] [N/A:N/A] % Reduction in Area: [3:-78.60%] [7:76.00%] [N/A:N/A] % Reduction in Volume: -77.30% [7:76.10%] [N/A:N/A] Classification: [3:Category/Stage II] [7:Category/Stage III] [N/A:N/A] Exudate Amount: [3:Medium] [7:Medium] [N/A:N/A] Exudate Type: [3:Serous] [7:Serosanguineous] [N/A:N/A] Exudate Color: [3:amber] [7:red, brown] [N/A:N/A] Wound Margin: [3:Distinct, outline attached] [7:Distinct, outline attached] [N/A:N/A] Granulation Amount: [3:Large (67-100%)] [7:Large (67-100%)] [N/A:N/A] Granulation Quality: [3:Red, Pink] [7:Pink] [N/A:N/A] Necrotic Amount: [3:None Present (0%)] [7:Small (1-33%)] [N/A:N/A] Exposed Structures: [3:Fascia: No Fat: No Tendon:  No Muscle: No Joint: No] [7:Fascia: No Fat: No Tendon: No Muscle: No Joint: No] [N/A:N/A] Bone: No Bone: No Limited to Skin Limited to Skin Breakdown Breakdown Epithelialization: None None N/A Periwound Skin Texture: Edema: Yes Edema: Yes N/A Excoriation: No Excoriation: No Induration: No Induration: No Callus: No Callus: No Crepitus: No Crepitus: No Fluctuance: No Fluctuance: No Friable: No Friable: No Rash: No Rash: No Scarring: No Scarring: No Periwound Skin Moist: Yes Moist: Yes N/A Moisture: Maceration: No Maceration: No Dry/Scaly: No Dry/Scaly: No Periwound Skin Color: Atrophie Blanche: No Atrophie Blanche: No N/A Cyanosis: No Cyanosis: No Ecchymosis: No Ecchymosis: No Erythema: No Erythema: No Hemosiderin Staining: No Hemosiderin Staining: No Mottled: No Mottled: No Pallor: No Pallor: No Rubor: No Rubor: No Temperature: No Abnormality No Abnormality N/A Tenderness on Yes No N/A Palpation: Wound Preparation: Ulcer Cleansing: Ulcer Cleansing: N/A Rinsed/Irrigated with Rinsed/Irrigated with Saline Saline Topical Anesthetic Topical Anesthetic Applied: Other: lidocaine Applied:  Other: lidocaine 4% 4% Treatment Notes Electronic Signature(s) Signed: 09/04/2015 6:21:38 PM By: Gretta Cool, RN, BSN, Kim RN, BSN Entered By: Gretta Cool, RN, BSN, Kim on 09/04/2015 14:32:15 Kruger, Marsia HMarland Kitchen ([id number]) -------------------------------------------------------------------------------- Multi-Disciplinary Care Plan Details Patient Name: [name], [name] Date of Service: 09/04/2015 2:15 PM Medical Record Number: [id number] Patient Account Number: [id number] Date of Birth/Sex: March 08, 1946 (69 y.o. Female) Treating RN: Cornell Barman Primary Care Physician: Paulita Cradle Other Clinician: Referring Physician: Paulita Cradle Treating Physician/Extender: Frann Rider in Treatment: 25 Active Inactive Abuse / Safety / Falls / Self Care Management Nursing Diagnoses: Impaired home maintenance Impaired physical mobility Knowledge deficit related to: safety; personal, health (wound), emergency Potential for falls Self care deficit: actual or potential Goals: Patient will remain injury free Date Initiated: 03/13/2015 Goal Status: Active Patient/caregiver will verbalize understanding of skin care regimen Date Initiated: 03/13/2015 Goal Status: Active Patient/caregiver will verbalize/demonstrate measure taken to improve self care Date Initiated: 03/13/2015 Goal Status: Active Patient/caregiver will verbalize/demonstrate measures taken to improve the patient's personal safety Date Initiated: 03/13/2015 Goal Status: Active Patient/caregiver will verbalize/demonstrate measures taken to prevent injury and/or falls Date Initiated: 03/13/2015 Goal Status: Active Patient/caregiver will verbalize/demonstrate understanding of what to do in case of emergency Date Initiated: 03/13/2015 Goal Status: Active Interventions: Assess fall risk on admission and as needed Assess: immobility, friction, shearing, incontinence upon admission and as needed Assess impairment of mobility on admission and  as needed per policy Assess self care needs on admission and as needed Provide education on basic hygiene Girten, [place]. ([id number]) Provide education on fall prevention Provide education on personal and home safety Provide education on safe transfers Treatment Activities: Education provided on Basic Hygiene : 03/13/2015 Notes: Orientation to the Wound Care Program Nursing Diagnoses: Knowledge deficit related to the wound healing center program Goals: Patient/caregiver will verbalize understanding of the [place] Date Initiated: 03/13/2015 Goal Status: Active Interventions: Provide education on orientation to the wound center Notes: Pressure Nursing Diagnoses: Knowledge deficit related to causes and risk factors for pressure ulcer development Knowledge deficit related to management of pressures ulcers Potential for impaired tissue integrity related to pressure, friction, moisture, and shear Goals: Patient will remain free from development of additional pressure ulcers Date Initiated: 03/13/2015 Goal Status: Active Patient will remain free of pressure ulcers Date Initiated: 03/13/2015 Goal Status: Active Patient/caregiver will verbalize risk factors for pressure ulcer development Date Initiated: 03/13/2015 Goal Status: Active Patient/caregiver will verbalize understanding of pressure ulcer management Date Initiated: 03/13/2015 Goal Status: Active Interventions: Assess: immobility, friction, shearing, incontinence upon admission and  as needed Liverman, Mariea H. ([id number]) Assess offloading mechanisms upon admission and as needed Assess potential for pressure ulcer upon admission and as needed Provide education on pressure ulcers Treatment Activities: Patient referred for home evaluation of offloading devices/mattresses : 09/04/2015 Patient referred for pressure reduction/relief devices : 09/04/2015 Patient referred for seating evaluation to ensure proper  offloading : 09/04/2015 Pressure reduction/relief device ordered : 09/04/2015 Test ordered outside of clinic : 09/04/2015 Notes: Wound/Skin Impairment Nursing Diagnoses: Impaired tissue integrity Knowledge deficit related to ulceration/compromised skin integrity Goals: Patient/caregiver will verbalize understanding of skin care regimen Date Initiated: 03/13/2015 Goal Status: Active Ulcer/skin breakdown will have a volume reduction of 30% by week 4 Date Initiated: 03/13/2015 Goal Status: Active Ulcer/skin breakdown will have a volume reduction of 50% by week 8 Date Initiated: 03/13/2015 Goal Status: Active Ulcer/skin breakdown will have a volume reduction of 80% by week 12 Date Initiated: 03/13/2015 Goal Status: Active Ulcer/skin breakdown will heal within 14 weeks Date Initiated: 03/13/2015 Goal Status: Active Interventions: Assess patient/caregiver ability to obtain necessary supplies Assess patient/caregiver ability to perform ulcer/skin care regimen upon admission and as needed Assess ulceration(s) every visit Provide education on smoking Provide education on ulcer and skin care Treatment Activities: Patient referred to home care : 09/04/2015 SALATA, [place]. ([id number]) Referred to DME Aswad Wandrey for dressing supplies : 09/04/2015 Skin care regimen initiated : 09/04/2015 Topical wound management initiated : 09/04/2015 Notes: Electronic Signature(s) Signed: 09/04/2015 6:21:38 PM By: Gretta Cool, RN, BSN, Kim RN, BSN Entered By: Gretta Cool, RN, BSN, Kim on 09/04/2015 14:32:07 [name], [name] ([id number]) -------------------------------------------------------------------------------- Pain Assessment Details Patient Name: [name], [name] Date of Service: 09/04/2015 2:15 PM Medical Record Number: [id number] Patient Account Number: [id number] Date of Birth/Sex: 21-Nov-1945 (69 y.o. Female) Treating RN: Cornell Barman Primary Care Physician: Paulita Cradle Other Clinician: Referring  Physician: Paulita Cradle Treating Physician/Extender: Frann Rider in Treatment: 25 Active Problems Location of Pain Severity and Description of Pain Patient Has Paino Yes Site Locations Pain Location: Generalized Pain Pain Management and Medication Current Pain Management: Electronic Signature(s) Signed: 09/04/2015 6:21:38 PM By: Gretta Cool, RN, BSN, Kim RN, BSN Entered By: Gretta Cool, RN, BSN, Kim on 09/04/2015 14:24:08 [name], Mckenzie Lemmie Evens ([id number]) -------------------------------------------------------------------------------- Patient/Caregiver Education Details Patient Name: [name], Zakari H. Date of Service: 09/04/2015 2:15 PM Medical Record Number: [id number] Patient Account Number: [id number] Date of Birth/Gender: [birth year] (69 y.o. Female) Treating RN: Cornell Barman Primary Care Physician: Paulita Cradle Other Clinician: Referring Physician: Paulita Cradle Treating Physician/Extender: Frann Rider in Treatment: 25 Education Assessment Education Provided To: Patient Education Topics Provided Wound/Skin Impairment: Handouts: Caring for Your Ulcer, Other: woubd care as prescribed Methods: Demonstration Responses: State content correctly Electronic Signature(s) Signed: 09/04/2015 6:21:38 PM By: Gretta Cool, RN, BSN, Kim RN, BSN Entered By: Gretta Cool, RN, BSN, Kim on 09/04/2015 14:43:40 Lindseth, Sanaiya H. ([id number]) -------------------------------------------------------------------------------- Wound Assessment Details Patient Name: Perkey, Aracely H. Date of Service: 09/04/2015 2:15 PM Medical Record Number: [id number] Patient Account Number: [id number] Date of Birth/Sex: [birth year] (69 y.o. Female) Treating RN: Cornell Barman Primary Care Physician: Paulita Cradle Other Clinician: Referring Physician: Paulita Cradle Treating Physician/Extender: Frann Rider in Treatment: 25 Wound Status Wound Number: 3 Primary Pressure Ulcer Etiology: Wound Location:  Back - Midline Wound Open Wounding Event: Pressure Injury Status: Date Acquired: 05/02/2015 Comorbid Cataracts, Asthma, Hypertension, Type Weeks Of Treatment: 17 History: II Diabetes, Neuropathy, Received Clustered Wound: No Chemotherapy Photos Photo Uploaded By: Gretta Cool, RN, BSN, Kim on 09/04/2015 18:34:16 Wound Measurements Length: (cm) 1 Width: (cm) 0.5 Depth: (cm) 0.1 Area: (  cm) 0.393 Volume: (cm) 0.039 % Reduction in Area: -78.6% % Reduction in Volume: -77.3% Epithelialization: None Wound Description Classification: Category/Stage II Wound Margin: Distinct, outline attached Exudate Amount: Medium Exudate Type: Serous Exudate Color: amber Foul Odor After Cleansing: No Wound Bed Granulation Amount: Large (67-100%) Exposed Structure Granulation Quality: Red, Pink Fascia Exposed: No Necrotic Amount: None Present (0%) Fat Layer Exposed: No Tendon Exposed: No Demartini, Alaylah H. ([id number]) Muscle Exposed: No Joint Exposed: No Bone Exposed: No Limited to Skin Breakdown Periwound Skin Texture Texture Color No Abnormalities Noted: No No Abnormalities Noted: No Callus: No Atrophie Blanche: No Crepitus: No Cyanosis: No Excoriation: No Ecchymosis: No Fluctuance: No Erythema: No Friable: No Hemosiderin Staining: No Induration: No Mottled: No Localized Edema: Yes Pallor: No Rash: No Rubor: No Scarring: No Temperature / Pain Moisture Temperature: No Abnormality No Abnormalities Noted: No Tenderness on Palpation: Yes Dry / Scaly: No Maceration: No Moist: Yes Wound Preparation Ulcer Cleansing: Rinsed/Irrigated with Saline Topical Anesthetic Applied: Other: lidocaine 4%, Treatment Notes Wound #3 (Midline Back) 2. Anesthetic Topical Lidocaine 4% cream to wound bed prior to debridement 4. Dressing Applied: Prisma Ag 5. Secondary Dressing Applied Bordered Foam Dressing Electronic Signature(s) Signed: 09/04/2015 6:21:38 PM By: Gretta Cool, RN, BSN, Kim RN,  BSN Entered By: Gretta Cool, RN, BSN, Kim on 09/04/2015 14:31:59 Edmonds, Sandara H. ([id number]) -------------------------------------------------------------------------------- Wound Assessment Details Patient Name: Rybarczyk, Antonisha H. Date of Service: 09/04/2015 2:15 PM Medical Record Number: [id number] Patient Account Number: [id number] Date of Birth/Sex: [birth year] (69 y.o. 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 Wound Open Wounding Event: Pressure Injury Status: Date Acquired: 08/01/2015 Comorbid Cataracts, Asthma, Hypertension, Type Weeks Of Treatment: 4 History: II Diabetes, Neuropathy, Received Clustered Wound: No Chemotherapy Photos Photo Uploaded By: Gretta Cool, RN, BSN, Kim on 09/04/2015 18:34:16 Wound Measurements Length: (cm) 0.8 Width: (cm) 1.2 Depth: (cm) 0.1 Area: (cm) 0.754 Volume: (cm) 0.075 % Reduction in Area: 76% % Reduction in Volume: 76.1% Epithelialization: None Wound Description Classification: Category/Stage III Wound Margin: Distinct, outline attached Exudate Amount: Medium Exudate Type: Serosanguineous Exudate Color: red, brown Foul Odor After Cleansing: No Wound Bed Granulation Amount: Large (67-100%) Exposed Structure Granulation Quality: Pink Fascia Exposed: No Necrotic Amount: Small (1-33%) Fat Layer Exposed: No Necrotic Quality: Adherent Slough Tendon Exposed: No Torre, Lorelee H. ([id number]) Muscle Exposed: No Joint Exposed: No Bone Exposed: No Limited to Skin Breakdown Periwound Skin Texture Texture Color No Abnormalities Noted: No No Abnormalities Noted: No Callus: No Atrophie Blanche: No Crepitus: No Cyanosis: No Excoriation: No Ecchymosis: No Fluctuance: No Erythema:  No Friable: No Hemosiderin Staining: No Induration: No Mottled: No Localized Edema: Yes Pallor: No Rash: No Rubor: No Scarring: No Temperature / Pain Moisture Temperature: No Abnormality No Abnormalities Noted: No Dry / Scaly: No Maceration: No Moist: Yes Wound Preparation Ulcer Cleansing: Rinsed/Irrigated with Saline Topical Anesthetic Applied: Other: lidocaine 4%, Treatment Notes Wound #7 (Right Ischial Tuberosity) 2. Anesthetic Topical Lidocaine 4% cream to wound bed prior to debridement 4. Dressing Applied: Prisma Ag 5. Secondary Dressing Applied Bordered Foam Dressing Electronic Signature(s) Signed: 09/04/2015 6:21:38 PM By: Gretta Cool, RN, BSN, Kim RN, BSN Entered By: Gretta Cool, RN, BSN, Kim on 09/04/2015 14:30:20 Naas, Donella H. ([id number]) -------------------------------------------------------------------------------- Vitals Details Patient Name: [name], Terra H. Date of Service: 09/04/2015 2:15 PM Medical Record Number: [id number] Patient Account Number: [id number] Date of Birth/Sex: [birth year] (  69 y.o. Female) Treating RN: Cornell Barman Primary Care Physician: Paulita Cradle Other Clinician: Referring Physician: Paulita Cradle Treating Physician/Extender: Frann Rider in Treatment: 25 Vital Signs Time Taken: 14:24 Temperature (F): 97.7 Height (in): 65 Pulse (bpm): 89 Weight (lbs): 118 Respiratory Rate (breaths/min): 18 Body Mass Index (BMI): 19.6 Blood Pressure (mmHg): 98/83 Reference Range: 80 - 120 mg / dl Electronic Signature(s) Signed: 09/04/2015 6:21:38 PM By: Gretta Cool, RN, BSN, Kim RN, BSN Entered By: Gretta Cool, RN, BSN, Kim on 09/04/2015 14:24:34

## 2015-09-09 ENCOUNTER — Inpatient Hospital Stay
Admission: EM | Admit: 2015-09-09 | Discharge: 2015-09-16 | DRG: 871 | Disposition: A | Discharge status: Home Health | Payer: Medicare Other | Attending: Internal Medicine | Admitting: Internal Medicine

## 2015-09-09 ENCOUNTER — Encounter: Payer: Self-pay | Admitting: Emergency Medicine

## 2015-09-09 ENCOUNTER — Emergency Department: Payer: Medicare Other

## 2015-09-09 DIAGNOSIS — Z79891 Long term (current) use of opiate analgesic: Secondary | ICD-10-CM | POA: Diagnosis not present

## 2015-09-09 DIAGNOSIS — Z7401 Bed confinement status: Secondary | ICD-10-CM | POA: Diagnosis not present

## 2015-09-09 DIAGNOSIS — Z452 Encounter for adjustment and management of vascular access device: Secondary | ICD-10-CM

## 2015-09-09 DIAGNOSIS — Z881 Allergy status to other antibiotic agents status: Secondary | ICD-10-CM

## 2015-09-09 DIAGNOSIS — A419 Sepsis, unspecified organism: Secondary | ICD-10-CM | POA: Diagnosis present

## 2015-09-09 DIAGNOSIS — D509 Iron deficiency anemia, unspecified: Secondary | ICD-10-CM | POA: Diagnosis present

## 2015-09-09 DIAGNOSIS — L89113 Pressure ulcer of right upper back, stage 3: Secondary | ICD-10-CM | POA: Diagnosis present

## 2015-09-09 DIAGNOSIS — I10 Essential (primary) hypertension: Secondary | ICD-10-CM | POA: Diagnosis present

## 2015-09-09 DIAGNOSIS — E860 Dehydration: Secondary | ICD-10-CM | POA: Diagnosis present

## 2015-09-09 DIAGNOSIS — L89223 Pressure ulcer of left hip, stage 3: Secondary | ICD-10-CM | POA: Diagnosis present

## 2015-09-09 DIAGNOSIS — L89612 Pressure ulcer of right heel, stage 2: Secondary | ICD-10-CM | POA: Diagnosis present

## 2015-09-09 DIAGNOSIS — Z888 Allergy status to other drugs, medicaments and biological substances status: Secondary | ICD-10-CM | POA: Diagnosis not present

## 2015-09-09 DIAGNOSIS — L89213 Pressure ulcer of right hip, stage 3: Secondary | ICD-10-CM | POA: Diagnosis present

## 2015-09-09 DIAGNOSIS — I959 Hypotension, unspecified: Secondary | ICD-10-CM

## 2015-09-09 DIAGNOSIS — L89012 Pressure ulcer of right elbow, stage 2: Secondary | ICD-10-CM | POA: Diagnosis present

## 2015-09-09 DIAGNOSIS — R945 Abnormal results of liver function studies: Secondary | ICD-10-CM

## 2015-09-09 DIAGNOSIS — G629 Polyneuropathy, unspecified: Secondary | ICD-10-CM | POA: Diagnosis present

## 2015-09-09 DIAGNOSIS — L8943 Pressure ulcer of contiguous site of back, buttock and hip, stage 3: Secondary | ICD-10-CM | POA: Diagnosis present

## 2015-09-09 DIAGNOSIS — E1140 Type 2 diabetes mellitus with diabetic neuropathy, unspecified: Secondary | ICD-10-CM | POA: Diagnosis present

## 2015-09-09 DIAGNOSIS — M4856XA Collapsed vertebra, not elsewhere classified, lumbar region, initial encounter for fracture: Secondary | ICD-10-CM | POA: Diagnosis present

## 2015-09-09 DIAGNOSIS — Z885 Allergy status to narcotic agent status: Secondary | ICD-10-CM | POA: Diagnosis not present

## 2015-09-09 DIAGNOSIS — K219 Gastro-esophageal reflux disease without esophagitis: Secondary | ICD-10-CM | POA: Diagnosis present

## 2015-09-09 DIAGNOSIS — F17210 Nicotine dependence, cigarettes, uncomplicated: Secondary | ICD-10-CM | POA: Diagnosis present

## 2015-09-09 DIAGNOSIS — Z79899 Other long term (current) drug therapy: Secondary | ICD-10-CM | POA: Diagnosis not present

## 2015-09-09 DIAGNOSIS — G9340 Encephalopathy, unspecified: Secondary | ICD-10-CM | POA: Diagnosis present

## 2015-09-09 DIAGNOSIS — M40209 Unspecified kyphosis, site unspecified: Secondary | ICD-10-CM | POA: Diagnosis present

## 2015-09-09 DIAGNOSIS — K921 Melena: Secondary | ICD-10-CM | POA: Diagnosis present

## 2015-09-09 DIAGNOSIS — E039 Hypothyroidism, unspecified: Secondary | ICD-10-CM | POA: Diagnosis present

## 2015-09-09 DIAGNOSIS — G8929 Other chronic pain: Secondary | ICD-10-CM | POA: Diagnosis present

## 2015-09-09 DIAGNOSIS — Z792 Long term (current) use of antibiotics: Secondary | ICD-10-CM

## 2015-09-09 DIAGNOSIS — L8990 Pressure ulcer of unspecified site, unspecified stage: Secondary | ICD-10-CM

## 2015-09-09 DIAGNOSIS — R7989 Other specified abnormal findings of blood chemistry: Secondary | ICD-10-CM

## 2015-09-09 DIAGNOSIS — L089 Local infection of the skin and subcutaneous tissue, unspecified: Secondary | ICD-10-CM | POA: Diagnosis present

## 2015-09-09 DIAGNOSIS — Z853 Personal history of malignant neoplasm of breast: Secondary | ICD-10-CM | POA: Diagnosis not present

## 2015-09-09 DIAGNOSIS — R509 Fever, unspecified: Secondary | ICD-10-CM

## 2015-09-09 LAB — CBC
HCT: 25.3 % — ABNORMAL LOW (ref 35.0–47.0)
HEMOGLOBIN: 8.3 g/dL — AB (ref 12.0–16.0)
MCH: 28.8 pg (ref 26.0–34.0)
MCHC: 32.9 g/dL (ref 32.0–36.0)
MCV: 87.6 fL (ref 80.0–100.0)
PLATELETS: 373 10*3/uL (ref 150–440)
RBC: 2.89 MIL/uL — AB (ref 3.80–5.20)
RDW: 17.7 % — ABNORMAL HIGH (ref 11.5–14.5)
WBC: 24.7 10*3/uL — ABNORMAL HIGH (ref 3.6–11.0)

## 2015-09-09 LAB — COMPREHENSIVE METABOLIC PANEL
ALBUMIN: 3.5 g/dL (ref 3.5–5.0)
ALT: 49 U/L (ref 14–54)
AST: 168 U/L — AB (ref 15–41)
Alkaline Phosphatase: 300 U/L — ABNORMAL HIGH (ref 38–126)
Anion gap: 7 (ref 5–15)
BILIRUBIN TOTAL: 1.9 mg/dL — AB (ref 0.3–1.2)
BUN: 26 mg/dL — AB (ref 6–20)
CHLORIDE: 103 mmol/L (ref 101–111)
CO2: 23 mmol/L (ref 22–32)
Calcium: 9.5 mg/dL (ref 8.9–10.3)
Creatinine, Ser: 1.22 mg/dL — ABNORMAL HIGH (ref 0.44–1.00)
GFR calc Af Amer: 52 mL/min — ABNORMAL LOW (ref >60)
GFR calc non Af Amer: 44 mL/min — ABNORMAL LOW (ref >60)
GLUCOSE: 110 mg/dL — AB (ref 65–99)
POTASSIUM: 3.9 mmol/L (ref 3.5–5.1)
SODIUM: 133 mmol/L — AB (ref 135–145)
Total Protein: 6.2 g/dL — ABNORMAL LOW (ref 6.5–8.1)

## 2015-09-09 LAB — URINALYSIS COMPLETE WITH MICROSCOPIC (ARMC ONLY)
BILIRUBIN URINE: NEGATIVE
Bacteria, UA: NONE SEEN
GLUCOSE, UA: NEGATIVE mg/dL
Ketones, ur: NEGATIVE mg/dL
Leukocytes, UA: NEGATIVE
Nitrite: NEGATIVE
Protein, ur: 30 mg/dL — AB
Specific Gravity, Urine: 1.014 (ref 1.005–1.030)
Squamous Epithelial / LPF: NONE SEEN
pH: 5 (ref 5.0–8.0)

## 2015-09-09 LAB — LACTIC ACID, PLASMA
LACTIC ACID, VENOUS: 1.3 mmol/L (ref 0.5–2.0)
LACTIC ACID, VENOUS: 1.5 mmol/L (ref 0.5–2.0)
LACTIC ACID, VENOUS: 2.3 mmol/L — AB (ref 0.5–2.0)
Lactic Acid, Venous: 1.6 mmol/L (ref 0.5–2.0)

## 2015-09-09 LAB — TROPONIN I
TROPONIN I: 0.04 ng/mL — AB (ref <0.031)
TROPONIN I: 0.05 ng/mL — AB (ref <0.031)
TROPONIN I: 0.05 ng/mL — AB (ref <0.031)

## 2015-09-09 LAB — CBC WITH DIFFERENTIAL/PLATELET
BASOS ABS: 0.1 10*3/uL (ref 0–0.1)
BASOS PCT: 0 %
Eosinophils Absolute: 0.2 10*3/uL (ref 0–0.7)
Eosinophils Relative: 1 %
HEMATOCRIT: 26.2 % — AB (ref 35.0–47.0)
Hemoglobin: 8.6 g/dL — ABNORMAL LOW (ref 12.0–16.0)
Lymphocytes Relative: 5 %
Lymphs Abs: 1 10*3/uL (ref 1.0–3.6)
MCH: 29.2 pg (ref 26.0–34.0)
MCHC: 32.6 g/dL (ref 32.0–36.0)
MCV: 89.5 fL (ref 80.0–100.0)
MONO ABS: 1.3 10*3/uL — AB (ref 0.2–0.9)
Monocytes Relative: 6 %
NEUTROS ABS: 17.5 10*3/uL — AB (ref 1.4–6.5)
Neutrophils Relative %: 88 %
PLATELETS: 287 10*3/uL (ref 150–440)
RBC: 2.93 MIL/uL — ABNORMAL LOW (ref 3.80–5.20)
RDW: 17.7 % — AB (ref 11.5–14.5)
WBC: 20 10*3/uL — ABNORMAL HIGH (ref 3.6–11.0)

## 2015-09-09 LAB — BASIC METABOLIC PANEL
Anion gap: 9 (ref 5–15)
BUN: 26 mg/dL — ABNORMAL HIGH (ref 6–20)
CHLORIDE: 100 mmol/L — AB (ref 101–111)
CO2: 24 mmol/L (ref 22–32)
CREATININE: 1.45 mg/dL — AB (ref 0.44–1.00)
Calcium: 10.1 mg/dL (ref 8.9–10.3)
GFR, EST AFRICAN AMERICAN: 42 mL/min — AB (ref >60)
GFR, EST NON AFRICAN AMERICAN: 36 mL/min — AB (ref >60)
Glucose, Bld: 128 mg/dL — ABNORMAL HIGH (ref 65–99)
POTASSIUM: 3.9 mmol/L (ref 3.5–5.1)
SODIUM: 133 mmol/L — AB (ref 135–145)

## 2015-09-09 LAB — APTT: aPTT: 42 seconds — ABNORMAL HIGH (ref 24–36)

## 2015-09-09 LAB — PROTIME-INR
INR: 1.02
PROTHROMBIN TIME: 13.6 s (ref 11.4–15.0)

## 2015-09-09 LAB — PROCALCITONIN: PROCALCITONIN: 5.85 ng/mL

## 2015-09-09 MED ORDER — ZOLPIDEM TARTRATE 5 MG PO TABS
5.0000 mg | ORAL_TABLET | Freq: Every evening | ORAL | Status: DC | PRN
  Administered 2015-09-10 – 2015-09-15 (×4): 5 mg via ORAL
  Filled 2015-09-09 (×4): qty 1

## 2015-09-09 MED ORDER — OLOPATADINE HCL 0.1 % OP SOLN
1.0000 [drp] | Freq: Two times a day (BID) | OPHTHALMIC | Status: DC
  Administered 2015-09-09 – 2015-09-15 (×11): 1 [drp] via OPHTHALMIC
  Filled 2015-09-09: qty 5

## 2015-09-09 MED ORDER — ENOXAPARIN SODIUM 40 MG/0.4ML ~~LOC~~ SOLN: 40.0000 mg | SUBCUTANEOUS | Status: DC

## 2015-09-09 MED ORDER — VANCOMYCIN HCL IN DEXTROSE 1-5 GM/200ML-% IV SOLN
1000.0000 mg | Freq: Once | INTRAVENOUS | Status: AC
  Administered 2015-09-09: 1000 mg via INTRAVENOUS
  Filled 2015-09-09: qty 200

## 2015-09-09 MED ORDER — HYDROCODONE-ACETAMINOPHEN 5-325 MG PO TABS
1.0000 | ORAL_TABLET | Freq: Four times a day (QID) | ORAL | Status: DC | PRN
  Administered 2015-09-09 (×2): 1 via ORAL
  Filled 2015-09-09 (×2): qty 1

## 2015-09-09 MED ORDER — SENNOSIDES-DOCUSATE SODIUM 8.6-50 MG PO TABS: 1.0000 | ORAL_TABLET | Freq: Every evening | ORAL | Status: DC | PRN

## 2015-09-09 MED ORDER — ENOXAPARIN SODIUM 30 MG/0.3ML ~~LOC~~ SOLN
30.0000 mg | SUBCUTANEOUS | Status: DC
  Administered 2015-09-09: 30 mg via SUBCUTANEOUS
  Filled 2015-09-09: qty 0.3

## 2015-09-09 MED ORDER — ONDANSETRON HCL 4 MG PO TABS: 4.0000 mg | ORAL_TABLET | Freq: Four times a day (QID) | ORAL | Status: DC | PRN

## 2015-09-09 MED ORDER — PIPERACILLIN-TAZOBACTAM 3.375 G IVPB
3.3750 g | Freq: Three times a day (TID) | INTRAVENOUS | Status: DC
  Administered 2015-09-09 – 2015-09-15 (×19): 3.375 g via INTRAVENOUS
  Filled 2015-09-09 (×21): qty 50

## 2015-09-09 MED ORDER — ALPRAZOLAM 0.25 MG PO TABS
0.2500 mg | ORAL_TABLET | Freq: Two times a day (BID) | ORAL | Status: DC
  Administered 2015-09-09 – 2015-09-13 (×9): 0.25 mg via ORAL
  Filled 2015-09-09 (×9): qty 1

## 2015-09-09 MED ORDER — SODIUM CHLORIDE 0.9 % IV BOLUS (SEPSIS)
1000.0000 mL | Freq: Once | INTRAVENOUS | Status: AC
  Administered 2015-09-09: 1000 mL via INTRAVENOUS

## 2015-09-09 MED ORDER — SODIUM CHLORIDE 0.9 % IV SOLN
INTRAVENOUS | Status: DC
  Administered 2015-09-09 – 2015-09-10 (×3): via INTRAVENOUS

## 2015-09-09 MED ORDER — SODIUM CHLORIDE 0.9 % IJ SOLN
3.0000 mL | Freq: Two times a day (BID) | INTRAMUSCULAR | Status: DC
  Administered 2015-09-09 – 2015-09-11 (×5): 3 mL via INTRAVENOUS

## 2015-09-09 MED ORDER — PANTOPRAZOLE SODIUM 40 MG PO TBEC
40.0000 mg | DELAYED_RELEASE_TABLET | Freq: Every day | ORAL | Status: DC
  Administered 2015-09-09 – 2015-09-16 (×8): 40 mg via ORAL
  Filled 2015-09-09 (×8): qty 1

## 2015-09-09 MED ORDER — GABAPENTIN 400 MG PO CAPS
400.0000 mg | ORAL_CAPSULE | Freq: Three times a day (TID) | ORAL | Status: DC
  Administered 2015-09-09 – 2015-09-16 (×23): 400 mg via ORAL
  Filled 2015-09-09 (×23): qty 1

## 2015-09-09 MED ORDER — ASPIRIN EC 81 MG PO TBEC
81.0000 mg | DELAYED_RELEASE_TABLET | Freq: Every day | ORAL | Status: DC
  Administered 2015-09-09 – 2015-09-11 (×3): 81 mg via ORAL
  Filled 2015-09-09 (×3): qty 1

## 2015-09-09 MED ORDER — ONDANSETRON HCL 4 MG/2ML IJ SOLN: 4.0000 mg | Freq: Four times a day (QID) | INTRAMUSCULAR | Status: DC | PRN

## 2015-09-09 MED ORDER — VANCOMYCIN HCL 500 MG IV SOLR
500.0000 mg | INTRAVENOUS | Status: DC
  Administered 2015-09-10: 500 mg via INTRAVENOUS
  Filled 2015-09-09: qty 500

## 2015-09-09 MED ORDER — OXYCODONE HCL 5 MG PO TABS
5.0000 mg | ORAL_TABLET | ORAL | Status: DC | PRN
  Administered 2015-09-09 – 2015-09-13 (×18): 10 mg via ORAL
  Administered 2015-09-13: 5 mg via ORAL
  Administered 2015-09-13 (×2): 10 mg via ORAL
  Administered 2015-09-13: 5 mg via ORAL
  Administered 2015-09-14 – 2015-09-16 (×14): 10 mg via ORAL
  Filled 2015-09-09 (×36): qty 2

## 2015-09-09 NOTE — Progress Notes (Signed)
ANTIBIOTIC CONSULT NOTE - INITIAL  Pharmacy Consult for Vancomycin and Zosyn  Indication: rule out sepsis  Allergies  Allergen Reactions  . Boniva [Ibandronic Acid]     Pain neuropathy worse  . Codeine Other (See Comments)    Nervous/hyper  . Erythromycin Other (See Comments)    "Liver damage"  . Levaquin [Levofloxacin In D5w] Other (See Comments)    Hallucinations    Patient Measurements: Weight: 94 lb 1.6 oz (42.683 kg)  Vital Signs: Temp: 97.7 F (36.5 C) (12/27 0705) Temp Source: Oral (12/27 0705) BP: 113/59 mmHg (12/27 0705) Pulse Rate: 91 (12/27 0705)  Recent Labs  09/09/15 0057  WBC 24.7*  HGB 8.3*  PLT 373  CREATININE 1.45*   Estimated Creatinine Clearance: 25 mL/min (by C-G formula based on Cr of 1.45). No results for input(s): VANCOTROUGH, VANCOPEAK, VANCORANDOM, GENTTROUGH, GENTPEAK, GENTRANDOM, TOBRATROUGH, TOBRAPEAK, TOBRARND, AMIKACINPEAK, AMIKACINTROU, AMIKACIN in the last 72 hours.   Microbiology: No results found for this or any previous visit (from the past 720 hour(s)).  Medical History: Past Medical History  Diagnosis Date  . Diabetes mellitus without complication ([place])   . Hypertension   . Neuropathy ([place])   . GERD (gastroesophageal reflux disease)   . Hypothyroidism   . Breast cancer ([place])     breast-left  . Bed sore on heel   . Bed sore on elbow    Assessment: 69 yo female with PMH of DM, HTN and neuropathy brought to ED by EMS for lethargy and confusion. In ED patient was found to have elevated WBC of 24.7, was hypotensive with an infected decubitus ulcer. Sepsis protocol was initiated and pharmacy was consulted for dosing and monitoring of Vancomycin and Zosyn.   CrCl: 25 mL/min     Scr: 1.45  Goal of Therapy:  Vancomycin trough level 15-20 mcg/ml  Plan:  Ke: 0.025   T1/2: 28hr   Vd: 30  Patient received Vancomycin 1gm in the ED at 0600. Will start patient on Vancomycin 500mg  IV q36 hrs with 28hr stack dosing. Will plan for  trough level to be drawn prior to 3rd regimen dose. Calculated trough at Css 15.4.  Will start patient on Zosyn 3.375g IV q8 hr.   Pharmacy will continue to follow cultures and monitor labs and renal function. Dose adjustments will be made as needed.   Nancy Fetter, PharmD Pharmacy Resident  09/09/2015,7:42 AM

## 2015-09-09 NOTE — ED Notes (Signed)
EMS pt from home it possible pt took too many pain meds last took 1 hour PTA

## 2015-09-09 NOTE — Progress Notes (Signed)
Pt complaining of insufficient pain relief from Norco. MD Dr. Jannifer Franklin notified, orders changed to oxycodone. RN will continue to monitor. Rachael Fee, RN

## 2015-09-09 NOTE — ED Notes (Signed)
In and out cath performed by this RN and ED tech Marcie Bal pt tolerated well sample sent to lab

## 2015-09-09 NOTE — Consult Note (Signed)
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 Present on admission Wound type: Full thickness pressure ulcers Pressure Ulcer POA: Yes Measurement: Mid spine 2 cm x 0.5 cm x 0.1 cm maroon discoloration with denuded center suspected deep tissue injury Left trochanter 0.5 cm in diameter stage 3 pressure injury Right trochanter 2 cm x 2 cm x 0.2 cm Stage 3 pressure injury Wound [id number] Drainage (amount, consistency, odor) Moderate serosanguinous from right hip Minimal serosanguinous from left hip and spine ulcers Periwound:intact Dressing procedure/placement/frequency:Cleanse nonintact lesions to midspine, left and right trochanter with NS and pat  Gently dry.  Apply Allevyn silicone border foam dressing  Change every 3 days and PRN soilage.  Will not follow at this time.  Please re-consult if needed.  Domenic Moras RN BSN [place] Pager [phone]

## 2015-09-09 NOTE — H&P (Signed)
[facility] NAME: Mariann Niebel    MR#:  [id number]  DATE OF BIRTH:  [birth year]  DATE OF ADMISSION:  09/09/2015  PRIMARY CARE PHYSICIAN: [name], MD   REQUESTING/REFERRING PHYSICIAN:   CHIEF COMPLAINT:   Chief Complaint  Patient presents with  . Altered Mental Status    EMS pt from home it possible pt took too many pain meds last took 1 hour PTA     HISTORY OF PRESENT ILLNESS: Geraldeen Minasyan  is a 69 y.o. female with a known history of diabetes mellitus diet controlled, hypertension, neuropathy, decubitus ulcers presented to the emergency room with lethargy and confusion. Patient was difficult to arouse and family called EMS and patient was brought to the emergency room. Upon arrival in the ER patient blood pressure was low systolic around 80 mmHg. Patient was resuscitated with IV fluids and her blood pressure improved. Patient goes to wound care clinic for the last 8 weeks for the treatment of decubitus ulcers. Patient has multiple decubitus ulcers 1 in the lower back and one on both the hips. Patient has pain in the decubitus ulcers. No history of any fever. Generalized weakness present. No history of fall or head injury. Evaluation of the patient in the ER showed elevated WBC count and infected decubitus ulcers. Patient became more alert and responsive after IV fluids were given and patient was also given IV antibiotics in the emergency room. No history of chest pain. No history of any shortness of breath. No cough. No dysuria or hematuria.  PAST MEDICAL HISTORY:   Past Medical History  Diagnosis Date  . Diabetes mellitus without complication ([place])   . Hypertension   . Neuropathy ([place])   . GERD (gastroesophageal reflux disease)   . Hypothyroidism   . Breast cancer ([place])     breast-left  . Bed sore on heel   . Bed sore on elbow     PAST SURGICAL HISTORY:  Past Surgical History  Procedure Laterality Date  .  Cholecystectomy    . Breast lumpectomy Left   . Orif humerus fracture Right 04/24/2015    Procedure: OPEN REDUCTION INTERNAL FIXATION (ORIF) PROXIMAL HUMERUS FRACTURE;  Surgeon: Corky Mull, MD;  Location: [facility] ORS;  Service: Orthopedics;  Laterality: Right;    SOCIAL HISTORY:  Social History  Substance Use Topics  . Smoking status: Current Every Day Smoker -- 1.00 packs/day for 40 years  . Smokeless tobacco: Not on file  . Alcohol Use: 0.0 oz/week    0 Standard drinks or equivalent per week     Comment: daily    FAMILY HISTORY:  Family History  Problem Relation Age of Onset  . Breast cancer Mother     DRUG ALLERGIES:  Allergies  Allergen Reactions  . Boniva [Ibandronic Acid]     Pain neuropathy worse  . Codeine Other (See Comments)    Nervous/hyper  . Erythromycin Other (See Comments)    "Liver damage"  . Levaquin [Levofloxacin In D5w] Other (See Comments)    Hallucinations     REVIEW OF SYSTEMS:   CONSTITUTIONAL: No fever, Has fatigue and weakness.  EYES: No blurred or double vision.  EARS, NOSE, AND THROAT: No tinnitus or ear pain.  RESPIRATORY: No cough, shortness of breath, wheezing or hemoptysis.  CARDIOVASCULAR: No chest pain, orthopnea, edema.  GASTROINTESTINAL: No nausea, vomiting, diarrhea or abdominal pain.  GENITOURINARY: No dysuria, hematuria.  ENDOCRINE: No polyuria, nocturia,  HEMATOLOGY: No  anemia, easy bruising or bleeding SKIN: multiple decubitus ulcers  MUSCULOSKELETAL: No joint pain or arthritis.   NEUROLOGIC: No tingling, numbness, weakness.  PSYCHIATRY: No anxiety or depression.   MEDICATIONS AT HOME:  Prior to Admission medications   Medication Sig Start Date End Date Taking? Authorizing Provider  ALPRAZolam (XANAX) 0.25 MG tablet Take 0.25 mg by mouth 2 (two) times daily.   Yes Historical Provider, MD  fexofenadine (ALLEGRA) 180 MG tablet Take 180 mg by mouth daily.   Yes Historical Provider, MD  gabapentin (NEURONTIN) 400 MG capsule  Take 400 mg by mouth 3 (three) times daily.   Yes Historical Provider, MD  HYDROcodone-acetaminophen (NORCO/VICODIN) 5-325 MG per tablet Take 1 tablet by mouth every 6 (six) hours as needed for moderate pain.   Yes Historical Provider, MD  ibuprofen (ADVIL,MOTRIN) 200 MG tablet Take 200 mg by mouth every 6 (six) hours as needed.   Yes Historical Provider, MD  lansoprazole (PREVACID) 30 MG capsule Take 30 mg by mouth daily at 12 noon.   Yes Historical Provider, MD  metoprolol succinate (TOPROL-XL) 100 MG 24 hr tablet Take 100 mg by mouth daily. Take with or immediately following a meal.   Yes Historical Provider, MD  mometasone (NASONEX) 50 MCG/ACT nasal spray Place 2 sprays into the nose daily.   Yes Historical Provider, MD  nitrofurantoin (MACRODANTIN) 50 MG capsule Take 50 mg by mouth daily.   Yes Historical Provider, MD  olopatadine (PATANOL) 0.1 % ophthalmic solution Place 1 drop into both eyes 2 (two) times daily.   Yes Historical Provider, MD  oxyCODONE (OXY IR/ROXICODONE) 5 MG immediate release tablet Take 1-2 tablets (5-10 mg total) by mouth every 4 (four) hours as needed for breakthrough pain. 04/26/15  Yes Lattie Corns, PA-C  zaleplon (SONATA) 10 MG capsule Take 10 mg by mouth at bedtime as needed for sleep.   Yes Historical Provider, MD      PHYSICAL EXAMINATION:   VITAL SIGNS: Blood pressure 106/62, pulse 86, resp. rate 14, SpO2 100 %.  GENERAL:  69 y.o.-year-old patient lying in the bed awake and alert EYES: Pupils equal, round, reactive to light and accommodation. No scleral icterus. Extraocular muscles intact.  HEENT: Head atraumatic, normocephalic. Oropharynx and nasopharynx clear.  NECK:  Supple, no jugular venous distention. No thyroid enlargement, no tenderness.  LUNGS: Normal breath sounds bilaterally, no wheezing, rales,rhonchi or crepitation. No use of accessory muscles of respiration.  CARDIOVASCULAR: S1, S2 normal. No murmurs, rubs, or gallops.  ABDOMEN: Soft,  nontender, nondistended. Bowel sounds present. No organomegaly or mass.  EXTREMITIES: No pedal edema, cyanosis, or clubbing.  NEUROLOGIC: Cranial nerves II through XII are intact. Muscle strength 5/5 in all extremities. Sensation intact. Gait not checked.  PSYCHIATRIC: The patient is alert and oriented x 3.  SKIN : Has 3 decubitus ulcers One on the lower back One ulcer over the right hip One ulcer over the left hip  LABORATORY PANEL:   CBC  Recent Labs Lab 09/09/15 0057  WBC 24.7*  HGB 8.3*  HCT 25.3*  PLT 373  MCV 87.6  MCH 28.8  MCHC 32.9  RDW 17.7*   ------------------------------------------------------------------------------------------------------------------  Chemistries   Recent Labs Lab 09/09/15 0057  NA 133*  K 3.9  CL 100*  CO2 24  GLUCOSE 128*  BUN 26*  CREATININE 1.45*  CALCIUM 10.1   ------------------------------------------------------------------------------------------------------------------ CrCl cannot be calculated (Unknown ideal weight.). ------------------------------------------------------------------------------------------------------------------ No results for input(s): TSH, T4TOTAL, T3FREE, THYROIDAB in the last 72 hours.  Invalid  input(s): FREET3   Coagulation profile No results for input(s): INR, PROTIME in the last 168 hours. ------------------------------------------------------------------------------------------------------------------- No results for input(s): DDIMER in the last 72 hours. -------------------------------------------------------------------------------------------------------------------  Cardiac Enzymes  Recent Labs Lab 09/09/15 0057  TROPONINI 0.05*   ------------------------------------------------------------------------------------------------------------------ Invalid input(s):  POCBNP  ---------------------------------------------------------------------------------------------------------------  Urinalysis    Component Value Date/Time   COLORURINE YELLOW* 09/09/2015 0105   APPEARANCEUR HAZY* 09/09/2015 0105   LABSPEC 1.014 09/09/2015 0105   PHURINE 5.0 09/09/2015 0105   GLUCOSEU NEGATIVE 09/09/2015 0105   HGBUR 3+* 09/09/2015 0105   BILIRUBINUR NEGATIVE 09/09/2015 0105   KETONESUR NEGATIVE 09/09/2015 0105   PROTEINUR 30* 09/09/2015 0105   NITRITE NEGATIVE 09/09/2015 0105   LEUKOCYTESUR NEGATIVE 09/09/2015 0105     RADIOLOGY: Ct Head Wo Contrast  09/09/2015  CLINICAL DATA:  Chronic weight loss, status post shoulder surgery, with ulcerations about the body. Loss of appetite. Initial encounter. EXAM: CT HEAD WITHOUT CONTRAST TECHNIQUE: Contiguous axial images were obtained from the base of the skull through the vertex without intravenous contrast. COMPARISON:  MRI of the brain performed 11/27/2014 FINDINGS: There is no evidence of acute infarction, mass lesion, or intra- or extra-axial hemorrhage on CT. Prominence of the ventricles and sulci reflects moderate cortical volume loss. Cerebellar atrophy is noted. Scattered periventricular and subcortical white matter change likely reflects small vessel ischemic microangiopathy. The brainstem and fourth ventricle are within normal limits. The basal ganglia are unremarkable in appearance. The cerebral hemispheres demonstrate grossly normal gray-white differentiation. No mass effect or midline shift is seen. There is no evidence of fracture; visualized osseous structures are unremarkable in appearance. The visualized portions of the orbits are within normal limits. The paranasal sinuses and mastoid air cells are well-aerated. No significant soft tissue abnormalities are seen. IMPRESSION: 1. No acute intracranial pathology seen on CT. 2. Moderate cortical volume loss and scattered small vessel ischemic microangiopathy.  Electronically Signed   By: Garald Balding M.D.   On: 09/09/2015 02:43    EKG: Orders placed or performed during the hospital encounter of 09/09/15  . ED EKG  . ED EKG    IMPRESSION AND PLAN: 68 year old female patient with history of diabetes mellitus, breast cancer, neuropathy,GERD, hypothyroidism presented to the emergency room with lethargy, confusion and low blood pressure. Admitting diagnosis 1. Hypotension 2. Sepsis 3. Infected decubitus ulcer 4. History of breast cancer 5. Dehydration 6. Leukocytosis Treatment plan Admit patient to telemetry IV fluid resuscitation Start patient on IV vancomycin and IV Zosyn antibiotic Wound care consult for decubitus ulcer debridement All of WBC count Hold hypertensive medication DVT prophylaxis with subcutaneous Lovenox Follow-up cultures  All the records are reviewed and case discussed with ED provider. Management plans discussed with the patient, family and they are in agreement.  CODE STATUS:FULL    Code Status Orders        Start     Ordered   09/09/15 0600  Full code   Continuous     09/09/15 0601       TOTAL TIME TAKING CARE OF THIS PATIENT: 50 minutes.    Saundra Shelling M.D on 09/09/2015 at 6:02 AM  Between 7am to 6pm - Pager - [phone]  After 6pm go to [email or web address] - password [email or web address] [facility]  [facility]  Office  [phone]  CC: Primary care physician; [name], MD

## 2015-09-09 NOTE — ED Provider Notes (Signed)
[facility] Emergency Department Provider Note  ____________________________________________  Time seen: Approximately 0030 AM  I have reviewed the triage vital signs and the nursing notes.   HISTORY  Chief Complaint Altered Mental Status  Unable to fully evaluate due to patient lethargy  HPI [name] is a 69 y.o. female who comes into the hospital today with lethargy. According to EMS the patient is a chronic pain patient and was called out because the patient had some minimal responsiveness. According to EMS when they arrived she was hypoxic and hypotensive. They gave her some O2 and normal saline and the patient's symptoms improved. According to the patient's husband she has been feeling nauseous for over a week. She went to bed yesterday and is stated in bed most of the day today. He reports that he thought the nausea was due to her medicine but it has been causing her to not eat. He is concerned that she may have some dehydration as she has not eaten or drank much in the last 10 days. He reports that she has had some ensures and some solid food but he does not feel enough to sustain her. The patient has not had any fevers has not complained of any belly pain and no headache or blurred vision. The patient's husband had called EMS because he was concerned about the patient's well-being and welfare. He does not recall her taking too much of her narcotic pain medicine.   Past Medical History  Diagnosis Date  . Diabetes mellitus without complication ([place])   . Hypertension   . Neuropathy ([place])   . GERD (gastroesophageal reflux disease)   . Hypothyroidism   . Breast cancer ([place])     breast-left  . Bed sore on heel   . Bed sore on elbow     Patient Active Problem List   Diagnosis Date Noted  . Sepsis ([place]) 09/09/2015  . Decubitus ulcer, infected 09/09/2015  . 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 Stage II pressure ulcer of  right elbow 07/04/2015  . Stage II pressure ulcer of right heel 07/04/2015  . Fracture, humerus, proximal 04/24/2015    Past Surgical History  Procedure Laterality Date  . Cholecystectomy    . Breast lumpectomy Left   . Orif humerus fracture Right 04/24/2015    Procedure: OPEN REDUCTION INTERNAL FIXATION (ORIF) PROXIMAL HUMERUS FRACTURE;  Surgeon: Corky Mull, MD;  Location: [facility] ORS;  Service: Orthopedics;  Laterality: Right;    No current outpatient prescriptions on file.  Allergies Boniva; Codeine; Erythromycin; and Levaquin  Family History  Problem Relation Age of Onset  . Breast cancer Mother     Social History Social History  Substance Use Topics  . Smoking status: Current Every Day Smoker -- 1.00 packs/day for 40 years  . Smokeless tobacco: None  . Alcohol Use: 0.0 oz/week    0 Standard drinks or equivalent per week     Comment: daily    Review of Systems Constitutional: No fever/chills Eyes: No visual changes. ENT: No sore throat. Cardiovascular: Denies chest pain. Respiratory: Denies shortness of breath. Gastrointestinal: Nausea with No abdominal pain.  no vomiting.  No diarrhea.  No constipation. Genitourinary: Negative for dysuria. Musculoskeletal: Negative for back pain. Skin: Negative for rash. Neurological: Negative for headaches, focal weakness or numbness.  10-point ROS otherwise negative.  ____________________________________________   PHYSICAL EXAM:  VITAL SIGNS: ED Triage Vitals  Enc Vitals Group     BP  09/09/15 0120 82/57 mmHg     Pulse Rate 09/09/15 0120 87     Resp 09/09/15 0120 17     Temp --      Temp src --      SpO2 09/09/15 0120 99 %     Weight --      Height --      Head Cir --      Peak Flow --      Pain Score --      Pain Loc --      Pain Edu? --      Excl. in [place]? --     Constitutional: Sleepy but oriented. Tired appearing and in mild distress. Eyes: Conjunctivae are normal. PERRL. EOMI. Head: Atraumatic. Nose: No  congestion/rhinnorhea. Mouth/Throat: Mucous membranes are moist.  Oropharynx non-erythematous. Cardiovascular: Normal rate, regular rhythm. Grossly normal heart sounds.  Good peripheral circulation. Respiratory: Normal respiratory effort.  No retractions. Lungs CTAB. Gastrointestinal: Soft and nontender. No distention. Positive bowel sounds Musculoskeletal: No lower extremity tenderness nor edema.  Neurologic:  Normal speech and language.  Skin:  Skin is warm, dry and intact. Marland Kitchen Psychiatric: Mood and affect are normal. .  ____________________________________________   LABS (all labs ordered are listed, but only abnormal results are displayed)  Labs Reviewed  CBC - Abnormal; Notable for the following:    WBC 24.7 (*)    RBC 2.89 (*)    Hemoglobin 8.3 (*)    HCT 25.3 (*)    RDW 17.7 (*)    All other components within normal limits  BASIC METABOLIC PANEL - Abnormal; Notable for the following:    Sodium 133 (*)    Chloride 100 (*)    Glucose, Bld 128 (*)    BUN 26 (*)    Creatinine, Ser 1.45 (*)    GFR calc non Af Amer 36 (*)    GFR calc Af Amer 42 (*)    All other components within normal limits  TROPONIN I - Abnormal; Notable for the following:    Troponin I 0.05 (*)    All other components within normal limits  URINALYSIS COMPLETEWITH MICROSCOPIC ([facility] ONLY) - Abnormal; Notable for the following:    Color, Urine YELLOW (*)    APPearance HAZY (*)    Hgb urine dipstick 3+ (*)    Protein, ur 30 (*)    All other components within normal limits  CBC WITH DIFFERENTIAL/PLATELET - Abnormal; Notable for the following:    WBC 20.0 (*)    RBC 2.93 (*)    Hemoglobin 8.6 (*)    HCT 26.2 (*)    RDW 17.7 (*)    Neutro Abs 17.5 (*)    Monocytes Absolute 1.3 (*)    All other components within normal limits  CULTURE, BLOOD (ROUTINE X 2)  CULTURE, BLOOD (ROUTINE X 2)  CULTURE, BLOOD (ROUTINE X 2)  CULTURE, BLOOD (ROUTINE X 2)  LACTIC ACID, PLASMA  LACTIC ACID, PLASMA  TROPONIN I   TROPONIN I  TROPONIN I  COMPREHENSIVE METABOLIC PANEL  LACTIC ACID, PLASMA  LACTIC ACID, PLASMA  PROCALCITONIN  PROTIME-INR  APTT   ____________________________________________  EKG  ED ECG REPORT I, Loney Hering, the attending physician, personally viewed and interpreted this ECG.   Date: 09/09/2015  EKG Time: 0054  Rate: 88  Rhythm: normal sinus rhythm  Axis: Normal  Intervals:none  ST&T Change: None  ____________________________________________  RADIOLOGY  CT head: No acute intracranial pathology seen on CT, moderate cortical  volume loss and scattered small vessel ischemic microangiopathy ____________________________________________   PROCEDURES  Procedure(s) performed: None  Critical Care performed: Yes, see critical care note(s)  CRITICAL CARE Performed by: Charlesetta Ivory P   Total critical care time: 30 minutes  Critical care time was exclusive of separately billable procedures and treating other patients.  Critical care was necessary to treat or prevent imminent or life-threatening deterioration.  Critical care was time spent personally by me on the following activities: development of treatment plan with patient and/or surrogate as well as nursing, discussions with consultants, evaluation of patient's response to treatment, examination of patient, obtaining history from patient or surrogate, ordering and performing treatments and interventions, ordering and review of laboratory studies, ordering and review of radiographic studies, pulse oximetry and re-evaluation of patient's condition.  ____________________________________________   INITIAL IMPRESSION / ASSESSMENT AND PLAN / ED COURSE  Pertinent labs & imaging results that were available during my care of the patient were reviewed by me and considered in my medical decision making (see chart for details).  This is a 69 year old female who comes into the hospital today with increased  lethargy per her husband. The patient was hypotensive on arrival 79/50. We did resume giving the patient normal saline and will continue to evaluate the patient.  After the normal saline the patient did perk up. Her blood pressure increased. The patient did not have any infection in her urine but does have some erythema to her pressure ulcer sites. I feel the patient may have some wound infections. I will give the patient some vancomycin. Given her initial symptoms as well as her white blood cell count and her hypotension I will admit the patient to the hospitalist service. The patient received 1500 ML's of normal saline her blood pressure rebounded without any difficulty. The patient's energy also picked up and she was more responsive after the fluid bolus. The patient will be admitted to the hospitalist service. ____________________________________________   FINAL CLINICAL IMPRESSION(S) / ED DIAGNOSES  Final diagnoses:  None      Loney Hering, MD 09/09/15 0830

## 2015-09-09 NOTE — Progress Notes (Signed)
DR Darvin Neighbours was informed of pt's lactic acid level 2.3 , see new order

## 2015-09-09 NOTE — ED Notes (Signed)
Spouse reports decreased PO intake and decline since a fall around may, reports development of bedsores. Stated last night pt went to bed and was at her baseline but slept in hours later than normal pt was altered EMS was called out the the home and assessed pt. Pt remained at home this evening pt became lethargic Ems called again and pt transported to the ER

## 2015-09-09 NOTE — Progress Notes (Signed)
DR Sidini was made aware of pt's trop . Level of 0.04. No new order at this time will continue to monitor

## 2015-09-09 NOTE — Care Management (Signed)
Patient admitted from home with hypotension and sepsis.  Patient lives at home with her husband.  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  Sitter comes 6 days a week.  Hours vary depending on the husbands availability. Patient states "I am never alone, there is always someone with me".  Patient is open with [facility] for skilled nursing for dressing changes.  Patient has multiple decubiti and is followed by the wound clinic.  Tim from [place] [facility] notified of admission.  Patient states that she has an air mattress at home, and ambulates with a walker.  Patient obtains her medications from [facility].  Will need resumptions of home health orders at time of discharge.  RNCM following for discharge planning

## 2015-09-10 ENCOUNTER — Inpatient Hospital Stay: Payer: Medicare Other

## 2015-09-10 LAB — IRON AND TIBC
IRON: 21 ug/dL — AB (ref 28–170)
Saturation Ratios: 11 % (ref 10.4–31.8)
TIBC: 184 ug/dL — ABNORMAL LOW (ref 250–450)
UIBC: 163 ug/dL

## 2015-09-10 LAB — CBC
HEMATOCRIT: 21.8 % — AB (ref 35.0–47.0)
Hemoglobin: 7.3 g/dL — ABNORMAL LOW (ref 12.0–16.0)
MCH: 29.2 pg (ref 26.0–34.0)
MCHC: 33.5 g/dL (ref 32.0–36.0)
MCV: 87.2 fL (ref 80.0–100.0)
Platelets: 270 10*3/uL (ref 150–440)
RBC: 2.5 MIL/uL — ABNORMAL LOW (ref 3.80–5.20)
RDW: 18.6 % — AB (ref 11.5–14.5)
WBC: 11.7 10*3/uL — ABNORMAL HIGH (ref 3.6–11.0)

## 2015-09-10 LAB — BASIC METABOLIC PANEL
Anion gap: 6 (ref 5–15)
BUN: 14 mg/dL (ref 6–20)
CALCIUM: 8.7 mg/dL — AB (ref 8.9–10.3)
CO2: 20 mmol/L — ABNORMAL LOW (ref 22–32)
CREATININE: 0.62 mg/dL (ref 0.44–1.00)
Chloride: 110 mmol/L (ref 101–111)
GFR calc Af Amer: >60 mL/min (ref >60)
GLUCOSE: 94 mg/dL (ref 65–99)
Potassium: 2.9 mmol/L — CL (ref 3.5–5.1)
Sodium: 136 mmol/L (ref 135–145)

## 2015-09-10 LAB — FERRITIN: FERRITIN: 298 ng/mL (ref 11–307)

## 2015-09-10 MED ORDER — POTASSIUM CHLORIDE 10 MEQ/100ML IV SOLN
10.0000 meq | INTRAVENOUS | Status: AC
  Administered 2015-09-10 (×2): 10 meq via INTRAVENOUS
  Filled 2015-09-10 (×2): qty 100

## 2015-09-10 MED ORDER — POTASSIUM CHLORIDE CRYS ER 20 MEQ PO TBCR
40.0000 meq | EXTENDED_RELEASE_TABLET | Freq: Once | ORAL | Status: AC
  Administered 2015-09-10: 40 meq via ORAL
  Filled 2015-09-10: qty 2

## 2015-09-10 MED ORDER — SODIUM CHLORIDE 0.9 % IV SOLN
500.0000 mg | INTRAVENOUS | Status: DC
  Administered 2015-09-11 – 2015-09-13 (×4): 500 mg via INTRAVENOUS
  Filled 2015-09-10 (×4): qty 500

## 2015-09-10 MED ORDER — POLYETHYLENE GLYCOL 3350 17 G PO PACK: 17.0000 g | PACK | Freq: Every day | ORAL | Status: DC

## 2015-09-10 MED ORDER — POLYETHYLENE GLYCOL 3350 17 G PO PACK
17.0000 g | PACK | Freq: Every day | ORAL | Status: DC
  Administered 2015-09-10: 17 g via ORAL
  Filled 2015-09-10: qty 1

## 2015-09-10 MED ORDER — SENNOSIDES-DOCUSATE SODIUM 8.6-50 MG PO TABS: 1.0000 | ORAL_TABLET | Freq: Two times a day (BID) | ORAL | Status: DC

## 2015-09-10 MED ORDER — POLYETHYLENE GLYCOL 3350 17 G PO PACK
17.0000 g | PACK | Freq: Two times a day (BID) | ORAL | Status: DC
Start: 2015-09-10 — End: 2015-09-16
  Administered 2015-09-10 – 2015-09-14 (×4): 17 g via ORAL
  Filled 2015-09-10 (×9): qty 1

## 2015-09-10 NOTE — Progress Notes (Signed)
Patient has been alert and oriented with some memory impairment. Sinus rhythm on tele. Complaining of chronic back pain all day that is not new, taking oxycodone for it with some improvement. PICC line placed for access this afternoon. Dressing was saturated with blood shortly after xray completed for insertion. Dressing changed and has site has stopped bleeding. Will continue to monitor. Patient is aware we will need a stool sample to check for blood in the stool and states "I know I will not go here in the hospital." Will attempt to collect if patient has a bowel movement and pass on to night shift.

## 2015-09-10 NOTE — Progress Notes (Addendum)
ANTIBIOTIC CONSULT NOTE - Follow Up  Pharmacy Consult for Vancomycin and Zosyn  Indication: rule out sepsis  Allergies  Allergen Reactions  . Boniva [Ibandronic Acid]     Pain neuropathy worse  . Codeine Other (See Comments)    Nervous/hyper  . Erythromycin Other (See Comments)    "Liver damage"  . Levaquin [Levofloxacin In D5w] Other (See Comments)    Hallucinations    Patient Measurements: Height: 5\' 4"  (162.6 cm) Weight: 94 lb 1.6 oz (42.683 kg) IBW/kg (Calculated) : 54.7  Vital Signs: Temp: 98.7 F (37.1 C) (12/28 1154) Temp Source: Oral (12/28 1154) BP: 118/60 mmHg (12/28 1154) Pulse Rate: 87 (12/28 1154)  Recent Labs  09/09/15 0057 09/09/15 0736 09/10/15 0427  WBC 24.7* 20.0* 11.7*  HGB 8.3* 8.6* 7.3*  PLT 373 287 270  CREATININE 1.45* 1.22* 0.62   Estimated Creatinine Clearance: 45.4 mL/min (by C-G formula based on Cr of 0.62). No results for input(s): VANCOTROUGH, VANCOPEAK, VANCORANDOM, GENTTROUGH, GENTPEAK, GENTRANDOM, TOBRATROUGH, TOBRAPEAK, TOBRARND, AMIKACINPEAK, AMIKACINTROU, AMIKACIN in the last 72 hours.   Microbiology: No results found for this or any previous visit (from the past 720 hour(s)).  Medical History: Past Medical History  Diagnosis Date  . Diabetes mellitus without complication ([place])   . Hypertension   . Neuropathy ([place])   . GERD (gastroesophageal reflux disease)   . Hypothyroidism   . Breast cancer ([place])     breast-left  . Bed sore on heel   . Bed sore on elbow    Assessment: 69 yo female with PMH of DM, HTN and neuropathy brought to ED by EMS for lethargy and confusion. In ED patient was found to have elevated WBC of 24.7, was hypotensive with an infected decubitus ulcer. Sepsis protocol was initiated and pharmacy was consulted for dosing and monitoring of Vancomycin and Zosyn.   CrCl: 45 ml/min (improved from 25 mL/min)     Scr: 0.62 ( down from 1.45)  WBC: 11.7 (down from 20)  Goal of Therapy:  Vancomycin trough level  15-20 mcg/ml  Plan:  New kinetics:   Ke: 0.042   T1/2: 16.5   Vd: 30  Patient received Vancomycin 1gm in the ED at 0600, then 500mg  at 1000 on 12/28.   Will start patient on Vancomycin 500mg  IV q18 hrs based on patient improved renal function. Will order trough level prior to 4th dose on 12/30 @1530 . Calculated trough at Css 17.4.  Will continue  patient on Zosyn 3.375g IV q8 hr.   Pharmacy will continue to follow cultures and monitor labs and renal function. Dose adjustments will be made as needed.   Nancy Fetter, PharmD Pharmacy Resident  09/10/2015,12:23 PM

## 2015-09-10 NOTE — Progress Notes (Signed)
[facility] at [place] NAME: [name]    MR#:  [id number]  DATE OF BIRTH:  1946-04-22  SUBJECTIVE:  CHIEF COMPLAINT:   Chief Complaint  Patient presents with  . Altered Mental Status    EMS pt from home it possible pt took too many pain meds last took 1 hour PTA    Chronic back pain is stable. No melena or blood in stool. As had nausea for about 2 weeks which is resolved. REVIEW OF SYSTEMS:    Review of Systems  Constitutional: Positive for chills and malaise/fatigue. Negative for fever.  HENT: Negative for sore throat.   Eyes: Negative for blurred vision, double vision and pain.  Respiratory: Negative for cough, hemoptysis, shortness of breath and wheezing.   Cardiovascular: Negative for chest pain, palpitations, orthopnea and leg swelling.  Gastrointestinal: Negative for heartburn, nausea, vomiting, abdominal pain, diarrhea and constipation.  Genitourinary: Negative for dysuria and hematuria.  Musculoskeletal: Positive for back pain. Negative for joint pain.  Skin: Negative for rash.  Neurological: Positive for weakness. Negative for sensory change, speech change, focal weakness and headaches.  Endo/Heme/Allergies: Does not bruise/bleed easily.  Psychiatric/Behavioral: Negative for depression. The patient is not nervous/anxious.       DRUG ALLERGIES:   Allergies  Allergen Reactions  . Boniva [Ibandronic Acid]     Pain neuropathy worse  . Codeine Other (See Comments)    Nervous/hyper  . Erythromycin Other (See Comments)    "Liver damage"  . Levaquin [Levofloxacin In D5w] Other (See Comments)    Hallucinations     VITALS:  Blood pressure 118/60, pulse 87, temperature 98.7 F (37.1 C), temperature source Oral, resp. rate 18, height 5\' 4"  (1.626 m), weight 42.683 kg (94 lb 1.6 oz), SpO2 100 %.  PHYSICAL EXAMINATION:   Physical Exam  GENERAL:  69 y.o.-year-old patient lying in the bed with no acute distress.   EYES: Pupils equal, round, reactive to light and accommodation. No scleral icterus. Extraocular muscles intact.  HEENT: Head atraumatic, normocephalic. Oropharynx and nasopharynx clear.  NECK:  Supple, no jugular venous distention. No thyroid enlargement, no tenderness.  LUNGS: Normal breath sounds bilaterally, no wheezing, rales, rhonchi. No use of accessory muscles of respiration.  CARDIOVASCULAR: S1, S2 normal. No murmurs, rubs, or gallops.  ABDOMEN: Soft, nontender, nondistended. Bowel sounds present. No organomegaly or mass.  EXTREMITIES: No cyanosis, clubbing or edema b/l.    NEUROLOGIC: Cranial nerves II through XII are intact. No focal Motor or sensory deficits b/l.   PSYCHIATRIC: The patient is alert and oriented x 3.  SKIN: No obvious rash, lesion, or ulcer.  Decubitus ulcers on upper back, sacrum, bilateral hips.   LABORATORY PANEL:   CBC  Recent Labs Lab 09/10/15 0427  WBC 11.7*  HGB 7.3*  HCT 21.8*  PLT 270   ------------------------------------------------------------------------------------------------------------------  Chemistries   Recent Labs Lab 09/09/15 0736 09/10/15 0427  NA 133* 136  K 3.9 2.9*  CL 103 110  CO2 23 20*  GLUCOSE 110* 94  BUN 26* 14  CREATININE 1.22* 0.62  CALCIUM 9.5 8.7*  AST 168*  --   ALT 49  --   ALKPHOS 300*  --   BILITOT 1.9*  --    ------------------------------------------------------------------------------------------------------------------  Cardiac Enzymes  Recent Labs Lab 09/09/15 1328  TROPONINI 0.05*   ------------------------------------------------------------------------------------------------------------------  RADIOLOGY:  Ct Head Wo Contrast  09/09/2015  CLINICAL DATA:  Chronic weight loss, status post shoulder surgery, with ulcerations about the  body. Loss of appetite. Initial encounter. EXAM: CT HEAD WITHOUT CONTRAST TECHNIQUE: Contiguous axial images were obtained from the base of the skull  through the vertex without intravenous contrast. COMPARISON:  MRI of the brain performed 11/27/2014 FINDINGS: There is no evidence of acute infarction, mass lesion, or intra- or extra-axial hemorrhage on CT. Prominence of the ventricles and sulci reflects moderate cortical volume loss. Cerebellar atrophy is noted. Scattered periventricular and subcortical white matter change likely reflects small vessel ischemic microangiopathy. The brainstem and fourth ventricle are within normal limits. The basal ganglia are unremarkable in appearance. The cerebral hemispheres demonstrate grossly normal gray-white differentiation. No mass effect or midline shift is seen. There is no evidence of fracture; visualized osseous structures are unremarkable in appearance. The visualized portions of the orbits are within normal limits. The paranasal sinuses and mastoid air cells are well-aerated. No significant soft tissue abnormalities are seen. IMPRESSION: 1. No acute intracranial pathology seen on CT. 2. Moderate cortical volume loss and scattered small vessel ischemic microangiopathy. Electronically Signed   By: Garald Balding M.D.   On: 09/09/2015 02:43     ASSESSMENT AND PLAN:   69 year old female patient with history of diabetes mellitus, breast cancer, neuropathy,GERD, hypothyroidism presented to the emergency room with lethargy, confusion and low blood pressure.  # Sepsis likely source is decubitus ulcers On IV abx. WBC  trending down Afebrile now We will wait for final cultures from blood.  # History of breast cancer Follow-up with oncology as outpatient.  # Severe dehydration due to decreased intake from nausea Improved with IV fluids. Contributing to her hypotension and lethargic.  # Acute encephalopathy present on admission due to infection has resolved.  # Microcytic anemia Check iron studies. Check stool for Hemoccult. [name] is blood if less than 7.  # DVT prophylaxis with heparin   All the  records are reviewed and case discussed with Care Management/Social Workerr. Management plans discussed with the patient, family and they are in agreement.  CODE STATUS: FULL  DVT Prophylaxis: SCDs  TOTAL TIME TAKING CARE OF THIS PATIENT: 35 minutes.   POSSIBLE D/C IN 2-3 DAYS, DEPENDING ON CLINICAL CONDITION.   Hillary Bow R M.D on 09/10/2015 at 2:24 PM  Between 7am to 6pm - Pager - [phone]  After 6pm go to [email or web address] - password [email or web address] [facility]  Office  [phone]  CC: Primary care physician; [name], MD    Note: This dictation was prepared with Dragon dictation along with smaller phrase technology. Any transcriptional errors that result from this process are unintentional.

## 2015-09-10 NOTE — Progress Notes (Signed)
Dr. Darvin Neighbours said to go ahead and get a PICC line placed. Patient takes stool softeners daily at home. Notified MD, orders placed.

## 2015-09-10 NOTE — Progress Notes (Signed)
Notified by tele that patient's heart rate dropped to the 30's and 40's for a few seconds then came back up. Another staff member went and checked on the patient and the patient was asymptomatic. Dr. Darvin Neighbours is on the floor now and was notified. Patient has also been a very hard stick for IV's. Per patient night shift attempted 6 times before getting one. Since patient got 2 runs of IV potassium the 2 IV's she had have now blown. Notified MD that we may not be able to get another peripheral. MD stated he would see the patient first and then add some orders as needed. Fluids are currently off due to no IV access.

## 2015-09-10 NOTE — Progress Notes (Signed)
Pt's potassium level this am is 2.9. MD notified. Orders given for PO and IV replacement. RN will administer and continue to monitor. Rachael Fee, RN

## 2015-09-11 ENCOUNTER — Inpatient Hospital Stay: Payer: Medicare Other

## 2015-09-11 ENCOUNTER — Ambulatory Visit: Payer: Medicare Other | Admitting: General Surgery

## 2015-09-11 LAB — COMPREHENSIVE METABOLIC PANEL
ALBUMIN: 2.9 g/dL — AB (ref 3.5–5.0)
ALT: 26 U/L (ref 14–54)
ANION GAP: 5 (ref 5–15)
AST: 56 U/L — ABNORMAL HIGH (ref 15–41)
Alkaline Phosphatase: 185 U/L — ABNORMAL HIGH (ref 38–126)
BUN: 7 mg/dL (ref 6–20)
CO2: 20 mmol/L — AB (ref 22–32)
Calcium: 8.7 mg/dL — ABNORMAL LOW (ref 8.9–10.3)
Chloride: 112 mmol/L — ABNORMAL HIGH (ref 101–111)
Creatinine, Ser: 0.46 mg/dL (ref 0.44–1.00)
GFR calc non Af Amer: >60 mL/min (ref >60)
GLUCOSE: 95 mg/dL (ref 65–99)
POTASSIUM: 3 mmol/L — AB (ref 3.5–5.1)
SODIUM: 137 mmol/L (ref 135–145)
Total Bilirubin: 1.1 mg/dL (ref 0.3–1.2)
Total Protein: 5.2 g/dL — ABNORMAL LOW (ref 6.5–8.1)

## 2015-09-11 LAB — CBC WITH DIFFERENTIAL/PLATELET
Basophils Absolute: 0.1 K/uL (ref 0–0.1)
Basophils Relative: 1 %
Eosinophils Absolute: 0.3 K/uL (ref 0–0.7)
Eosinophils Relative: 3 %
HCT: 20.4 % — ABNORMAL LOW (ref 35.0–47.0)
Hemoglobin: 6.9 g/dL — ABNORMAL LOW (ref 12.0–16.0)
Lymphocytes Relative: 7 %
Lymphs Abs: 0.7 K/uL — ABNORMAL LOW (ref 1.0–3.6)
MCH: 29.3 pg (ref 26.0–34.0)
MCHC: 33.8 g/dL (ref 32.0–36.0)
MCV: 86.6 fL (ref 80.0–100.0)
Monocytes Absolute: 1.1 K/uL — ABNORMAL HIGH (ref 0.2–0.9)
Monocytes Relative: 11 %
Neutro Abs: 8 K/uL — ABNORMAL HIGH (ref 1.4–6.5)
Neutrophils Relative %: 78 %
Platelets: 310 K/uL (ref 150–440)
RBC: 2.36 MIL/uL — ABNORMAL LOW (ref 3.80–5.20)
RDW: 18.9 % — ABNORMAL HIGH (ref 11.5–14.5)
WBC: 10.2 K/uL (ref 3.6–11.0)

## 2015-09-11 LAB — VITAMIN B12: VITAMIN B 12: 257 pg/mL (ref 180–914)

## 2015-09-11 LAB — HEMOGLOBIN AND HEMATOCRIT, BLOOD
HEMATOCRIT: 28.2 % — AB (ref 35.0–47.0)
Hemoglobin: 9.7 g/dL — ABNORMAL LOW (ref 12.0–16.0)

## 2015-09-11 LAB — PREPARE RBC (CROSSMATCH)

## 2015-09-11 LAB — MAGNESIUM: Magnesium: 1.4 mg/dL — ABNORMAL LOW (ref 1.7–2.4)

## 2015-09-11 LAB — OCCULT BLOOD X 1 CARD TO LAB, STOOL: Fecal Occult Bld: NEGATIVE

## 2015-09-11 LAB — FOLATE: FOLATE: 7.7 ng/mL (ref >5.9)

## 2015-09-11 MED ORDER — POTASSIUM CHLORIDE CRYS ER 20 MEQ PO TBCR
40.0000 meq | EXTENDED_RELEASE_TABLET | Freq: Once | ORAL | Status: AC
  Administered 2015-09-11: 40 meq via ORAL

## 2015-09-11 MED ORDER — MAGNESIUM SULFATE 2 GM/50ML IV SOLN
2.0000 g | Freq: Once | INTRAVENOUS | Status: AC
  Administered 2015-09-11: 2 g via INTRAVENOUS
  Filled 2015-09-11: qty 50

## 2015-09-11 MED ORDER — POTASSIUM CHLORIDE 10 MEQ/100ML IV SOLN
10.0000 meq | INTRAVENOUS | Status: AC
  Administered 2015-09-11 (×4): 10 meq via INTRAVENOUS
  Filled 2015-09-11 (×4): qty 100

## 2015-09-11 MED ORDER — ENSURE ENLIVE PO LIQD
237.0000 mL | Freq: Two times a day (BID) | ORAL | Status: DC
  Administered 2015-09-13 – 2015-09-14 (×4): 237 mL via ORAL

## 2015-09-11 MED ORDER — SODIUM CHLORIDE 0.9 % IV SOLN
Freq: Once | INTRAVENOUS | Status: AC
Start: 2015-09-11 — End: 2015-09-11
  Administered 2015-09-11: 14:00:00 via INTRAVENOUS

## 2015-09-11 MED ORDER — MAGNESIUM OXIDE 400 (241.3 MG) MG PO TABS
400.0000 mg | ORAL_TABLET | Freq: Every day | ORAL | Status: DC
  Administered 2015-09-11 – 2015-09-16 (×6): 400 mg via ORAL
  Filled 2015-09-11 (×6): qty 1

## 2015-09-11 MED ORDER — POTASSIUM CHLORIDE CRYS ER 20 MEQ PO TBCR
40.0000 meq | EXTENDED_RELEASE_TABLET | Freq: Two times a day (BID) | ORAL | Status: DC
  Administered 2015-09-11 – 2015-09-16 (×11): 40 meq via ORAL
  Filled 2015-09-11 (×12): qty 2

## 2015-09-11 MED ORDER — IOHEXOL 300 MG/ML  SOLN
80.0000 mL | Freq: Once | INTRAMUSCULAR | Status: AC | PRN
  Administered 2015-09-11: 80 mL via INTRAVENOUS

## 2015-09-11 MED ORDER — ALPRAZOLAM 0.25 MG PO TABS: 0.2500 mg | ORAL_TABLET | Freq: Once | ORAL | Status: DC

## 2015-09-11 NOTE — Progress Notes (Signed)
ID E note Reviewed notes, labs and imaging Admitted with AMS, WBC 25 K.   [place] ngtd, alk phos, T bili, ast elevated but decreasing, UA neg Imaging - CT head and cxr neg   Rec cont current vanco and zosyn Check RUQ USS given abnml lfts

## 2015-09-11 NOTE — Progress Notes (Signed)
Initial Nutrition Assessment    INTERVENTION:   Meals and Snacks: recommend liberalizing diet to Soft; if EGD negative, recommend liberalizing to Regular to promote po intake Medical Food Supplement Therapy: recommend Ensure Enlive po BID, each supplement provides 350 kcal and 20 grams of protein  NUTRITION DIAGNOSIS:   Increased nutrient needs related to wound healing, poor appetite, acute illness as evidenced by estimated needs.  GOAL:   Patient will meet greater than or equal to 90% of their needs  MONITOR:    (Energy Intake, Anthropometrics, Digestive System, Electrolyte/Renal Profile)  REASON FOR ASSESSMENT:   Malnutrition Screening Tool    ASSESSMENT:    Pt admitted with sepsis due to pressure ulcers, severe dehydration due to nausea (resolved), acute encephalopathy, symptomatic anemia with GI following with plans for EGD, US abdomen pending, CT abdomen. Pt not in room on visit today  Past Medical History  Diagnosis Date  . Diabetes mellitus without complication ([place])   . Hypertension   . Neuropathy ([place])   . GERD (gastroesophageal reflux disease)   . Hypothyroidism   . Breast cancer ([place])     breast-left  . Bed sore on heel   . Bed sore on elbow      Diet Order:  Heart Healthy/Carb Modified  Energy Intake: recorded po intake 65% of meals on average  Skin:   multiple stage I-III pressure ulcers  Last BM:  12/29   Electrolyte and Renal Profile: supplementing potassium and magnesium  Recent Labs Lab 09/09/15 0736 09/10/15 0427 09/11/15 0510  BUN 26* 14 7  CREATININE 1.22* 0.62 0.46  NA 133* 136 137  K 3.9 2.9* 3.0*  MG  --   --  1.4*   Glucose Profile: No results for input(s): GLUCAP in the last 72 hours.   Nutrition Focused Physical Exam:  Unable to complete Nutrition-Focused physical exam at this time.    Height:   Ht Readings from Last 1 Encounters:  09/09/15 5\' 4"  (1.626 m)    Weight: 21% wt loss since August per weight  encounters  Wt Readings from Last 1 Encounters:  09/09/15 94 lb 1.6 oz (42.683 kg)    Wt Readings from Last 10 Encounters:  09/09/15 94 lb 1.6 oz (42.683 kg)  04/24/15 119 lb (53.978 kg)  04/17/15 119 lb (53.978 kg)  01/21/15 119 lb (53.978 kg)    BMI:  Body mass index is 16.14 kg/(m^2).  Estimated Nutritional Needs:   Kcal:  1355-1581 kcals (BEE 941, 1.2 AF, 1.2-1.4 IF)   Protein:  65-77 g (1.5-1.8 g/kg)   Fluid:  1290-1505 mL (30-35 ml/kg)   HIGH Care Level  Kerman Passey MS, RD, LDN [phone] Pager  [phone] Weekend/On-Call Pager

## 2015-09-11 NOTE — Progress Notes (Signed)
ANTIBIOTIC CONSULT NOTE - Follow Up  Pharmacy Consult for Vancomycin and Zosyn  Indication: rule out sepsis  Allergies  Allergen Reactions  . Boniva [Ibandronic Acid]     Pain neuropathy worse  . Codeine Other (See Comments)    Nervous/hyper  . Erythromycin Other (See Comments)    "Liver damage"  . Levaquin [Levofloxacin In D5w] Other (See Comments)    Hallucinations    Patient Measurements: Height: 5\' 4"  (162.6 cm) Weight: 94 lb 1.6 oz (42.683 kg) IBW/kg (Calculated) : 54.7  Vital Signs: Temp: 98.2 F (36.8 C) (12/29 1406) Temp Source: Oral (12/29 1406) BP: 132/77 mmHg (12/29 1406) Pulse Rate: 84 (12/29 1406)  Recent Labs  09/09/15 0736 09/10/15 0427 09/11/15 0510  WBC 20.0* 11.7* 10.2  HGB 8.6* 7.3* 6.9*  PLT 287 270 310  CREATININE 1.22* 0.62 0.46   Estimated Creatinine Clearance: 45.4 mL/min (by C-G formula based on Cr of 0.46). No results for input(s): VANCOTROUGH, VANCOPEAK, VANCORANDOM, GENTTROUGH, GENTPEAK, GENTRANDOM, TOBRATROUGH, TOBRAPEAK, TOBRARND, AMIKACINPEAK, AMIKACINTROU, AMIKACIN in the last 72 hours.   Microbiology: Recent Results (from the past 720 hour(s))  Blood culture (routine x 2)     Status: None (Preliminary result)   Collection Time: 09/09/15  4:57 AM  Result Value Ref Range Status   Specimen Description BLOOD BLOOD LEFT FOREARM  Final   Special Requests BOTTLES DRAWN AEROBIC AND ANAEROBIC 5ML  Final   Culture NO GROWTH 2 DAYS  Final   Report Status PENDING  Incomplete  Blood culture (routine x 2)     Status: None (Preliminary result)   Collection Time: 09/09/15  4:57 AM  Result Value Ref Range Status   Specimen Description BLOOD RIGHT ANTECUBITAL  Final   Special Requests BOTTLES DRAWN AEROBIC AND ANAEROBIC 5ML  Final   Culture NO GROWTH 2 DAYS  Final   Report Status PENDING  Incomplete    Medical History: Past Medical History  Diagnosis Date  . Diabetes mellitus without complication ([place])   . Hypertension   . Neuropathy  ([place])   . GERD (gastroesophageal reflux disease)   . Hypothyroidism   . Breast cancer ([place])     breast-left  . Bed sore on heel   . Bed sore on elbow    Assessment: 69 yo female with PMH of DM, HTN and neuropathy brought to ED by EMS for lethargy and confusion. In ED patient was found to have elevated WBC of 24.7, was hypotensive with an infected decubitus ulcer. Sepsis protocol was initiated and pharmacy was consulted for dosing and monitoring of Vancomycin and Zosyn.   CrCl: 45 ml/min (improved from 25 mL/min)     Scr: 0.46 ( down from 1.45)  WBC: 10.2 (down from 20 on 12/27)  Goal of Therapy:  Vancomycin trough level 15-20 mcg/ml  Plan:   Ke: 0.042   T1/2: 16.5   Vd: 30  Will continue  Vancomycin 500mg  IV q18 hrs. Will order trough level prior to 4th dose on 12/30 @1530 . Calculated trough at Css 16.  Will continue  patient on Zosyn 3.375g IV q8 hr.   Pharmacy will continue to follow cultures and monitor labs and renal function. Dose adjustments will be made as needed. Recommend deescalation of therapy on 12/30.   Nancy Fetter, PharmD Pharmacy Resident  09/11/2015,2:23 PM

## 2015-09-11 NOTE — Progress Notes (Signed)
[facility] at [place] NAME: [name]    MR#:  [id number]  DATE OF BIRTH:  [birth year]  SUBJECTIVE:  CHIEF COMPLAINT:   Chief Complaint  Patient presents with  . Altered Mental Status    EMS pt from home it possible pt took too many pain meds last took 1 hour PTA    Chronic back pain is stable. Blood in stool today. As had nausea for about 2 weeks which is resolved. REVIEW OF SYSTEMS:    Review of Systems  Constitutional: Positive for chills and malaise/fatigue. Negative for fever.  HENT: Negative for sore throat.   Eyes: Negative for blurred vision, double vision and pain.  Respiratory: Negative for cough, hemoptysis, shortness of breath and wheezing.   Cardiovascular: Negative for chest pain, palpitations, orthopnea and leg swelling.  Gastrointestinal: Negative for heartburn, nausea, vomiting, abdominal pain, diarrhea and constipation.  Genitourinary: Negative for dysuria and hematuria.  Musculoskeletal: Positive for back pain. Negative for joint pain.  Skin: Negative for rash.  Neurological: Positive for weakness. Negative for sensory change, speech change, focal weakness and headaches.  Endo/Heme/Allergies: Does not bruise/bleed easily.  Psychiatric/Behavioral: Negative for depression. The patient is not nervous/anxious.       DRUG ALLERGIES:   Allergies  Allergen Reactions  . Boniva [Ibandronic Acid]     Pain neuropathy worse  . Codeine Other (See Comments)    Nervous/hyper  . Erythromycin Other (See Comments)    "Liver damage"  . Levaquin [Levofloxacin In D5w] Other (See Comments)    Hallucinations     VITALS:  Blood pressure 102/55, pulse 81, temperature 98.3 F (36.8 C), temperature source Oral, resp. rate 18, height 5\' 4"  (1.626 m), weight 42.683 kg (94 lb 1.6 oz), SpO2 97 %.  PHYSICAL EXAMINATION:   Physical Exam  GENERAL:  69 y.o.-year-old patient lying in the bed with no acute distress.  EYES:  Pupils equal, round, reactive to light and accommodation. No scleral icterus. Extraocular muscles intact.  HEENT: Head atraumatic, normocephalic. Oropharynx and nasopharynx clear.  NECK:  Supple, no jugular venous distention. No thyroid enlargement, no tenderness.  LUNGS: Normal breath sounds bilaterally, no wheezing, rales, rhonchi. No use of accessory muscles of respiration.  CARDIOVASCULAR: S1, S2 normal. No murmurs, rubs, or gallops.  ABDOMEN: Soft, nontender, nondistended. Bowel sounds present. No organomegaly or mass.  EXTREMITIES: No cyanosis, clubbing or edema b/l.    NEUROLOGIC: Cranial nerves II through XII are intact. No focal Motor or sensory deficits b/l.   PSYCHIATRIC: The patient is alert and oriented x 3.  SKIN: No obvious rash, lesion, or ulcer.  Decubitus ulcers on upper back, sacrum, bilateral hips.   LABORATORY PANEL:   CBC  Recent Labs Lab 09/11/15 0510  WBC 10.2  HGB 6.9*  HCT 20.4*  PLT 310   ------------------------------------------------------------------------------------------------------------------  Chemistries   Recent Labs Lab 09/11/15 0510  NA 137  K 3.0*  CL 112*  CO2 20*  GLUCOSE 95  BUN 7  CREATININE 0.46  CALCIUM 8.7*  MG 1.4*  AST 56*  ALT 26  ALKPHOS 185*  BILITOT 1.1   ------------------------------------------------------------------------------------------------------------------  Cardiac Enzymes  Recent Labs Lab 09/09/15 1328  TROPONINI 0.05*   ------------------------------------------------------------------------------------------------------------------  RADIOLOGY:  Dg Chest Port 1 View  09/10/2015  CLINICAL DATA:  PICC placement.  Sepsis. EXAM: PORTABLE CHEST 1 VIEW COMPARISON:  Chest x-ray dated 09/08/2009 FINDINGS: Right PICC is been inserted. The tip is 8 cm below the carina at the  cavoatrial junction. There is focal linear atelectasis at the right lung base. The lungs are otherwise clear. No effusions. Old  left posterior rib fractures. IMPRESSION: PICC tip at the cavoatrial junction. Focal atelectasis at the right lung base. Electronically Signed   By: [name] M.D.   On: 09/10/2015 16:21     ASSESSMENT AND PLAN:   69 year old female patient with history of diabetes mellitus, breast cancer, neuropathy,GERD, hypothyroidism presented to the emergency room with lethargy, confusion and low blood pressure.  # Sepsis likely source is decubitus ulcers On IV abx. WBC  trending down Afebrile now Bcx - NGTD. No source yet. Ordered MRI spine but patient refused as she is unable to lay flat with pain from ulcer. Will have to check with radiology if CT can be done. Consult ID   # Microcytic iron deficiency anemia Blood in stool. Transfuse 1 unit PRBC for worsening symptomatic anemia. Monitor Hb Consulted GI. Being scheduled for EGD/Colonoscopy  # History of breast cancer Follow-up with oncology as outpatient.  # Severe dehydration due to decreased intake from nausea Contributed to her hypotension and lethargic.  # Acute encephalopathy present on admission due to infection has resolved.  # DVT prophylaxis with heparin   All the records are reviewed and case discussed with Care Management/Social Workerr. Management plans discussed with the patient, family and they are in agreement.  CODE STATUS: FULL  DVT Prophylaxis: SCDs  TOTAL TIME TAKING CARE OF THIS PATIENT: 35 minutes.   POSSIBLE D/C IN 2-3 DAYS, DEPENDING ON CLINICAL CONDITION.   [name] R M.D on 09/11/2015 at 11:58 AM  Between 7am to 6pm - Pager - [phone]  After 6pm go to [email or web address] - password [email or web address] [facility]  Office  [phone]  CC: Primary care physician; Mckenzie Elk, MD    Note: This dictation was prepared with Dragon dictation along with smaller phrase technology. Any transcriptional errors that result from this process are unintentional.

## 2015-09-11 NOTE — Consult Note (Signed)
Due to fall in hgb and hx of advil I will do EGD tomorrow late morning.  Pt requesting xanax in early morning due to stress of anticipation of EGD.  Chest clear, heart RRR, last hgb was 9.7 after transfusions.  EGD for tomorrow morning.

## 2015-09-11 NOTE — Care Management Important Message (Signed)
Important Message  Patient Details  Name: ATIYAH HIGGENS MRN: [id number] Date of Birth: 1945-11-21   Medicare Important Message Given:  Yes    Juliann Pulse A Coda Mathey 09/11/2015, 11:34 AM

## 2015-09-11 NOTE — Progress Notes (Signed)
PT Cancellation Note  Patient Details Name: [name] MRN: [id number] DOB: Mar 22, 1946   Cancelled Treatment:    Reason Eval/Treat Not Completed: Medical issues which prohibited therapy.  Low hgb and elevated lactic acid, will await more stable presentation to evaluate.   Ramond Dial 09/11/2015, 5:39 PM   [name], PT MS Acute Rehab Dept. Number: [facility] [id number] and [name] [phone]

## 2015-09-11 NOTE — Consult Note (Signed)
GI Inpatient Consult Note  Reason for Consult: IDA   Attending Requesting Consult: Dr. Darvin Neighbours  History of Present Illness: [name] is a 69 y.o. female Is currently being hospitalized due to an episode of lethargy and confusion.  She has a significant past medical history of diet-controlled diabetes, hypertension, neuropathy, decubitus ulcers, on chronic pain medication  Her hemoglobin on arrival was 8.6, and is currently 6.9.  Her MCV 86.6.  Platelets 287. Her BUN and creatinine on arrival were 26/1.22 with a GFR of 44, currently 7/0.46, GFR 60. She had iron returned at 21, TIBC 184, ferritin within normal limits.  She did have a white blood cell count of 20.0, currently 10.2.  She is being treated with Zosyn and vancomycin.  She has not been diagnosed as anemic in the recent past, past medical records show a hemoglobin of 12.92 months ago, 14.18 months ago, 13.91 year ago.  Her last colonoscopy was March 22, 2011 with Dr. Vira Agar for the indication of family history of colonic polyps, first-degree relative.  Impression ; 1 8 mm polyp in the distal transverse colon.  Two diminutive polyps at the rectosigmoid colon.  Diverticulosis in the sigmoid colon.  Internal hemorrhoids.  She reports her last bowel movement was 3 days before the Christmas weekend, unsure if she was having any dark stools at the time.  She generally takes 3 stool softeners a day and will have to use Senokot to help her bowels move occasionally  She denies any chest pain or shortness of breath.  She also had elevated liver enzymes, alk phos on admission 300, currently 185. AST 168, currently 56, ALT 49, currently 26.  Bilirubin 1.9, currently 1.1.  On review of past medical records she has had elevated liver enzymes in the past.  She is unsure if she has ever had any further workup for these.  She does have a significant history of alcohol abuse.  Past Medical History:  Past Medical History  Diagnosis Date  . Diabetes  mellitus without complication ([place])   . Hypertension   . Neuropathy ([place])   . GERD (gastroesophageal reflux disease)   . Hypothyroidism   . Breast cancer ([place])     breast-left  . Bed sore on heel   . Bed sore on elbow     Problem List: Patient Active Problem List   Diagnosis Date Noted  . Sepsis ([place]) 09/09/2015  . Decubitus ulcer, infected 09/09/2015  . Stage III pressure ulcer of right upper back ([place]) 07/04/2015  . Stage II pressure ulcer of right elbow 07/04/2015  . Stage II pressure ulcer of right heel 07/04/2015  . Fracture, humerus, proximal 04/24/2015    Past Surgical History: Past Surgical History  Procedure Laterality Date  . Cholecystectomy    . Breast lumpectomy Left   . Orif humerus fracture Right 04/24/2015    Procedure: OPEN REDUCTION INTERNAL FIXATION (ORIF) PROXIMAL HUMERUS FRACTURE;  Surgeon: Corky Mull, MD;  Location: [facility] ORS;  Service: Orthopedics;  Laterality: Right;    Allergies: Allergies  Allergen Reactions  . Boniva [Ibandronic Acid]     Pain neuropathy worse  . Codeine Other (See Comments)    Nervous/hyper  . Erythromycin Other (See Comments)    "Liver damage"  . Levaquin [Levofloxacin In D5w] Other (See Comments)    Hallucinations     Home Medications: Prescriptions prior to admission  Medication Sig Dispense Refill Last Dose  . ALPRAZolam (XANAX) 0.25 MG tablet Take 0.25 mg by mouth  2 (two) times daily.   09/08/2015 at 2200  . fexofenadine (ALLEGRA) 180 MG tablet Take 180 mg by mouth daily.   09/08/2015 at 2200  . gabapentin (NEURONTIN) 400 MG capsule Take 400 mg by mouth 3 (three) times daily.   09/08/2015 at 2200  . HYDROcodone-acetaminophen (NORCO/VICODIN) 5-325 MG per tablet Take 1 tablet by mouth every 6 (six) hours as needed for moderate pain.   09/08/2015 at 2200  . ibuprofen (ADVIL,MOTRIN) 200 MG tablet Take 200 mg by mouth every 6 (six) hours as needed.   PRN at PRN  . lansoprazole (PREVACID) 30 MG capsule Take 30 mg by mouth  daily at 12 noon.   09/08/2015 at 2200  . metoprolol succinate (TOPROL-XL) 100 MG 24 hr tablet Take 100 mg by mouth daily. Take with or immediately following a meal.   09/08/2015 at 2200  . mometasone (NASONEX) 50 MCG/ACT nasal spray Place 2 sprays into the nose daily.   09/08/2015 at 2200  . nitrofurantoin (MACRODANTIN) 50 MG capsule Take 50 mg by mouth daily.   09/08/2015 at Unknown time  . olopatadine (PATANOL) 0.1 % ophthalmic solution Place 1 drop into both eyes 2 (two) times daily.   09/08/2015 at 2200  . oxyCODONE (OXY IR/ROXICODONE) 5 MG immediate release tablet Take 1-2 tablets (5-10 mg total) by mouth every 4 (four) hours as needed for breakthrough pain. 60 tablet 0 09/08/2015 at 2200  . zaleplon (SONATA) 10 MG capsule Take 10 mg by mouth at bedtime as needed for sleep.   09/08/2015 at [place] medication reconciliation was completed with the patient.   Scheduled Inpatient Medications:   . sodium chloride   Intravenous Once  . ALPRAZolam  0.25 mg Oral BID  . aspirin EC  81 mg Oral Daily  . enoxaparin (LOVENOX) injection  30 mg Subcutaneous Q24H  . gabapentin  400 mg Oral TID  . magnesium sulfate 1 - 4 g bolus IVPB  2 g Intravenous Once  . olopatadine  1 drop Both Eyes BID  . pantoprazole  40 mg Oral Daily  . piperacillin-tazobactam (ZOSYN)  IV  3.375 g Intravenous 3 times per day  . polyethylene glycol  17 g Oral BID  . potassium chloride  40 mEq Oral BID  . potassium chloride  40 mEq Oral Once  . sodium chloride  3 mL Intravenous Q12H  . vancomycin  500 mg Intravenous Q18H    Continuous Inpatient Infusions:     PRN Inpatient Medications:  ondansetron **OR** ondansetron (ZOFRAN) IV, oxyCODONE, senna-docusate, zolpidem  Family History: family history includes Breast cancer in her mother.    Social History:   reports that she has been smoking.  She does not have any smokeless tobacco history on file. She reports that she drinks alcohol. She reports that she does not  use illicit drugs.    Review of Systems: Constitutional: Weight is stable.  Eyes: No changes in vision. ENT: No oral lesions, sore throat.  GI: see HPI.  Heme/Lymph: No easy bruising.  CV: No chest pain.  GU: No hematuria.  Integumentary: No rashes.  Neuro: No headaches.  Psych: No depression/anxiety.  Endocrine: No heat/cold intolerance.  Allergic/Immunologic: No urticaria.  Resp: No cough, SOB.  Musculoskeletal: No joint swelling.    Physical Examination: BP 102/55 mmHg  Pulse 81  Temp(Src) 98.3 F (36.8 C) (Oral)  Resp 18  Ht 5' 4"  (1.626 m)  Wt 42.683 kg (94 lb 1.6 oz)  BMI 16.14 kg/m2  SpO2  97% Gen: NAD, alert and oriented x 4, husband is present and helps with history HEENT: PEERLA, EOMI, Neck: supple, no JVD or thyromegaly Chest: CTA bilaterally, no wheezes, crackles, or other adventitious sounds CV: RRR, no m/g/c/r Abd: soft, NT, ND, +BS in all four quadrants; no HSM, guarding, ridigity, or rebound tenderness Ext: no edema, well perfused with 2+ pulses, Skin: ulcers noted, being treated by wound care Lymph: no LAD Rectal exam:  stool guaiac positive, yellow-brown stool in vault   Data: Lab Results  Component Value Date   WBC 10.2 09/11/2015   HGB 6.9* 09/11/2015   HCT 20.4* 09/11/2015   MCV 86.6 09/11/2015   PLT 310 09/11/2015    Recent Labs Lab 09/09/15 0736 09/10/15 0427 09/11/15 0510  HGB 8.6* 7.3* 6.9*   Lab Results  Component Value Date   NA 137 09/11/2015   K 3.0* 09/11/2015   CL 112* 09/11/2015   CO2 20* 09/11/2015   BUN 7 09/11/2015   CREATININE 0.46 09/11/2015   Lab Results  Component Value Date   ALT 26 09/11/2015   AST 56* 09/11/2015   ALKPHOS 185* 09/11/2015   BILITOT 1.1 09/11/2015    Recent Labs Lab 09/09/15 0736  APTT 42*  INR 1.02    Assessment/Plan: [name] is a 69 y.o. female IDA, heme positive stool.Her hemoglobin on arrival was 8.6, and is currently 6.9.  Her MCV 86.6.  Platelets 287. Her BUN and  creatinine on arrival were 26/1.22 with a GFR of 44, currently 7/0.46, GFR 60.  Iron 21, TIBC 184, ferritin within normal limits.  Recommendations: Will proceed with upper EGD tomorrow for further evaluation. We agree with transfusing as needed. Recommend that she f/u as outpatient with [facility] for further evaluation of elevated LFT's.    Thank you for the consult. Please call with questions or concerns.  Salvadore Farber, PA-C  I personally performed these services.

## 2015-09-11 NOTE — Progress Notes (Signed)
Patient is scheduled for an ultrasound, CT, and to get blood. Unit is now ready, but CT is calling to get patient now. Will let patient go get CT first and then start blood afterwards. Blood consent has been signed. Patient is eating yogurt for lunch, so she cannot have the ultrasound done until tomorrow. EGD is also planned for tomorrow, so patient will already be NPO for procedure and can have ultrasound as well. Patient updated on plan of care for today. Potassium runs are finished as well as magnesium.

## 2015-09-12 ENCOUNTER — Inpatient Hospital Stay: Payer: Medicare Other

## 2015-09-12 ENCOUNTER — Inpatient Hospital Stay: Payer: Medicare Other | Admitting: Anesthesiology

## 2015-09-12 ENCOUNTER — Encounter
Admission: EM | Disposition: A | Discharge status: Home Health | Payer: Self-pay | Source: Home / Self Care | Attending: Internal Medicine

## 2015-09-12 ENCOUNTER — Encounter: Payer: Self-pay | Admitting: *Deleted

## 2015-09-12 HISTORY — PX: ESOPHAGOGASTRODUODENOSCOPY: SHX5428

## 2015-09-12 LAB — CBC WITH DIFFERENTIAL/PLATELET
BASOS ABS: 0.1 10*3/uL (ref 0–0.1)
BASOS PCT: 1 %
Eosinophils Absolute: 0.4 10*3/uL (ref 0–0.7)
Eosinophils Relative: 5 %
HEMATOCRIT: 26.4 % — AB (ref 35.0–47.0)
Hemoglobin: 8.9 g/dL — ABNORMAL LOW (ref 12.0–16.0)
Lymphocytes Relative: 14 %
Lymphs Abs: 1.2 10*3/uL (ref 1.0–3.6)
MCH: 28.8 pg (ref 26.0–34.0)
MCHC: 34 g/dL (ref 32.0–36.0)
MCV: 84.9 fL (ref 80.0–100.0)
MONO ABS: 1.3 10*3/uL — AB (ref 0.2–0.9)
Monocytes Relative: 15 %
NEUTROS ABS: 5.5 10*3/uL (ref 1.4–6.5)
NEUTROS PCT: 65 %
Platelets: 302 10*3/uL (ref 150–440)
RBC: 3.11 MIL/uL — AB (ref 3.80–5.20)
RDW: 18.8 % — AB (ref 11.5–14.5)
WBC: 8.4 10*3/uL (ref 3.6–11.0)

## 2015-09-12 LAB — BASIC METABOLIC PANEL
Anion gap: 4 — ABNORMAL LOW (ref 5–15)
BUN: <5 mg/dL — ABNORMAL LOW (ref 6–20)
CALCIUM: 8.9 mg/dL (ref 8.9–10.3)
CHLORIDE: 112 mmol/L — AB (ref 101–111)
CO2: 20 mmol/L — ABNORMAL LOW (ref 22–32)
CREATININE: 0.51 mg/dL (ref 0.44–1.00)
GFR calc non Af Amer: >60 mL/min (ref >60)
Glucose, Bld: 102 mg/dL — ABNORMAL HIGH (ref 65–99)
Potassium: 3.8 mmol/L (ref 3.5–5.1)
SODIUM: 136 mmol/L (ref 135–145)

## 2015-09-12 LAB — TYPE AND SCREEN
ABO/RH(D): O POS
ANTIBODY SCREEN: NEGATIVE
UNIT DIVISION: 0

## 2015-09-12 LAB — MAGNESIUM: Magnesium: 1.6 mg/dL — ABNORMAL LOW (ref 1.7–2.4)

## 2015-09-12 SURGERY — EGD (ESOPHAGOGASTRODUODENOSCOPY)
Anesthesia: General

## 2015-09-12 MED ORDER — IPRATROPIUM-ALBUTEROL 0.5-2.5 (3) MG/3ML IN SOLN
3.0000 mL | Freq: Once | RESPIRATORY_TRACT | Status: AC
  Administered 2015-09-12: 3 mL via RESPIRATORY_TRACT

## 2015-09-12 MED ORDER — IPRATROPIUM-ALBUTEROL 0.5-2.5 (3) MG/3ML IN SOLN
RESPIRATORY_TRACT | Status: AC
  Administered 2015-09-12: 3 mL via RESPIRATORY_TRACT
  Filled 2015-09-12: qty 3

## 2015-09-12 MED ORDER — SODIUM CHLORIDE 0.9 % IV SOLN
INTRAVENOUS | Status: DC
  Administered 2015-09-12: 10:00:00 via INTRAVENOUS

## 2015-09-12 MED ORDER — PROPOFOL 10 MG/ML IV BOLUS
INTRAVENOUS | Status: DC | PRN
  Administered 2015-09-12 (×2): 30 mg via INTRAVENOUS

## 2015-09-12 MED ORDER — PROPOFOL 500 MG/50ML IV EMUL
INTRAVENOUS | Status: DC | PRN
  Administered 2015-09-12: 120 ug/kg/min via INTRAVENOUS

## 2015-09-12 MED ORDER — LIDOCAINE HCL (CARDIAC) 20 MG/ML IV SOLN
INTRAVENOUS | Status: DC | PRN
  Administered 2015-09-12: 80 mg via INTRAVENOUS

## 2015-09-12 MED ORDER — SODIUM CHLORIDE 0.9 % IJ SOLN: 10.0000 mL | INTRAMUSCULAR | Status: DC | PRN

## 2015-09-12 MED ORDER — MORPHINE SULFATE (PF) 2 MG/ML IV SOLN
2.0000 mg | Freq: Once | INTRAVENOUS | Status: AC
Start: 2015-09-12 — End: 2015-09-12
  Administered 2015-09-12: 2 mg via INTRAVENOUS
  Filled 2015-09-12: qty 1

## 2015-09-12 MED ORDER — SODIUM CHLORIDE 0.9 % IJ SOLN
10.0000 mL | Freq: Two times a day (BID) | INTRAMUSCULAR | Status: DC
  Administered 2015-09-12 – 2015-09-13 (×5): 10 mL
  Administered 2015-09-14: 20 mL
  Administered 2015-09-14: 10 mL
  Administered 2015-09-15: 20 mL
  Administered 2015-09-15 – 2015-09-16 (×2): 10 mL

## 2015-09-12 MED ORDER — MIDAZOLAM HCL 2 MG/2ML IJ SOLN
INTRAMUSCULAR | Status: DC | PRN
  Administered 2015-09-12: 1 mg via INTRAVENOUS

## 2015-09-12 NOTE — Transfer of Care (Signed)
Immediate Anesthesia Transfer of Care Note  Patient: [name]  Procedure(s) Performed: Procedure(s): ESOPHAGOGASTRODUODENOSCOPY (EGD) (N/A)  Patient Location: PACU  Anesthesia Type:General  Level of Consciousness: sedated  Airway & Oxygen Therapy: Patient Spontanous Breathing and Patient connected to nasal cannula oxygen  Post-op Assessment: Report given to RN and Post -op Vital signs reviewed and stable  Post vital signs: Reviewed and stable  Last Vitals:  Filed Vitals:   09/12/15 0438 09/12/15 1046  BP: 114/64 140/83  Pulse: 82 82  Temp: 37.2 C 36.4 C  Resp: 16 16    Complications: No apparent anesthesia complications

## 2015-09-12 NOTE — Progress Notes (Signed)
Patient has rested quietly tonight. Multiple complaints of chronic back pain pain treated with PRN pain medicine. No signs of discomfort or distress noted. Patient has been NPO since midnight for EGD scheduled for later today. Husband at bedside. Nursing staff will continue to monitor. Earleen Reaper, RN

## 2015-09-12 NOTE — Anesthesia Preprocedure Evaluation (Signed)
Anesthesia Evaluation  Patient identified by MRN, date of birth, ID band Patient awake    Reviewed: Allergy & Precautions, H&P , NPO status , Patient's Chart, lab work & pertinent test results  History of Anesthesia Complications Negative for: history of anesthetic complications  Airway Mallampati: III  TM Distance: >3 FB Neck ROM: full    Dental  (+) Teeth Intact   Pulmonary neg shortness of breath, COPD, Current Smoker,    Pulmonary exam normal breath sounds clear to auscultation       Cardiovascular Exercise Tolerance: Good hypertension, (-) angina(-) Past MI and (-) DOE Normal cardiovascular exam Rhythm:regular Rate:Normal     Neuro/Psych negative neurological ROS  negative psych ROS   GI/Hepatic Neg liver ROS, GERD  Controlled,  Endo/Other  diabetes, Type 2Hypothyroidism   Renal/GU negative Renal ROS  negative genitourinary   Musculoskeletal   Abdominal   Peds  Hematology negative hematology ROS (+)   Anesthesia Other Findings Past Medical History:   Diabetes mellitus without complication (HCC)                 Hypertension                                                 Neuropathy (HCC)                                             GERD (gastroesophageal reflux disease)                       Hypothyroidism                                               Breast cancer (HCC)                                            Comment:breast-left   Bed sore on heel                                             Bed sore on elbow                                           Past Surgical History:   CHOLECYSTECTOMY                                               BREAST LUMPECTOMY                               Left  ORIF HUMERUS FRACTURE                           Right 04/24/2015      Comment:Procedure: OPEN REDUCTION INTERNAL FIXATION               (ORIF) PROXIMAL HUMERUS FRACTURE;  Surgeon:               Corky Mull, MD;  Location: [facility] ORS;                Service: Orthopedics;  Laterality: Right;  BMI    Body Mass Index   16.12 kg/m 2      Reproductive/Obstetrics negative OB ROS                             Anesthesia Physical Anesthesia Plan  ASA: III  Anesthesia Plan: General   Post-op Pain Management:    Induction:   Airway Management Planned:   Additional Equipment:   Intra-op Plan:   Post-operative Plan:   Informed Consent: I have reviewed the patients History and Physical, chart, labs and discussed the procedure including the risks, benefits and alternatives for the proposed anesthesia with the patient or authorized representative who has indicated his/her understanding and acceptance.   Dental Advisory Given  Plan Discussed with: Anesthesiologist, CRNA and Surgeon  Anesthesia Plan Comments:         Anesthesia Quick Evaluation

## 2015-09-12 NOTE — Op Note (Signed)
[facility] Gastroenterology Patient Name: [name] Procedure Date: 09/12/2015 12:04 PM MRN: [id number] Account #: [id number] Date of Birth: [birth year] Admit Type: Inpatient Age: 69 Room: [facility] ENDO ROOM 4 Gender: Female Note Status: Finalized Procedure:         Upper GI endoscopy Indications:       Anemia, Heme positive stool Providers:         Manya Silvas, MD Referring MD:      Precious Bard, MD (Referring MD) Medicines:         Propofol per Anesthesia Complications:     No immediate complications. Procedure:         Pre-Anesthesia Assessment:                    - After reviewing the risks and benefits, the patient was                     deemed in satisfactory condition to undergo the procedure.                    After obtaining informed consent, the endoscope was passed                     under direct vision. Throughout the procedure, the                     patient's blood pressure, pulse, and oxygen saturations                     were monitored continuously. The Endoscope was introduced                     through the mouth, and advanced to the second part of                     duodenum. The upper GI endoscopy was accomplished without                     difficulty. The patient tolerated the procedure well. The                     upper GI endoscopy was accomplished without difficulty.                     The patient tolerated the procedure well. Findings:      The examined esophagus was normal. Questionable Barretts short segment,       prominent vessel runninng over mucosa so biopsy not done. GEJ 40cm.      The entire examined stomach was normal.      Diffuse slightly granular mucosa was found in the second part of the       duodenum. Biopsies were taken with a cold forceps for histology.       Biopsies for histology were taken with a cold forceps for for evaluation       of celiac disease.      No ulcer, gastritis, or AVM  seen. Impression:        - Normal esophagus.                    - Normal stomach.                    - Granular mucosa at 2nd part of the duodenum. Biopsied. Recommendation:    -  Await pathology results. Manya Silvas, MD 09/12/2015 12:32:43 PM This report has been signed electronically. Number of Addenda: 0 Note Initiated On: 09/12/2015 12:04 PM      [facility]

## 2015-09-12 NOTE — Progress Notes (Addendum)
ANTIBIOTIC CONSULT NOTE - Follow Up  Pharmacy Consult for Vancomycin and Zosyn  Indication: rule out sepsis  Allergies  Allergen Reactions  . Boniva [Ibandronic Acid]     Pain neuropathy worse  . Codeine Other (See Comments)    Nervous/hyper  . Erythromycin Other (See Comments)    "Liver damage"  . Levaquin [Levofloxacin In D5w] Other (See Comments)    Hallucinations    Patient Measurements: Height: 5\' 4"  (162.6 cm) Weight: 94 lb (42.638 kg) IBW/kg (Calculated) : 54.7  Vital Signs: Temp: 97.9 F (36.6 C) (12/30 1327) Temp Source: Oral (12/30 1327) BP: 145/72 mmHg (12/30 1327) Pulse Rate: 73 (12/30 1327)  Recent Labs  09/10/15 0427 09/11/15 0510 09/11/15 1822 09/12/15 0523  WBC 11.7* 10.2  --  8.4  HGB 7.3* 6.9* 9.7* 8.9*  PLT 270 310  --  302  CREATININE 0.62 0.46  --  0.51   Estimated Creatinine Clearance: 45.3 mL/min (by C-G formula based on Cr of 0.51). No results for input(s): VANCOTROUGH, VANCOPEAK, VANCORANDOM, GENTTROUGH, GENTPEAK, GENTRANDOM, TOBRATROUGH, TOBRAPEAK, TOBRARND, AMIKACINPEAK, AMIKACINTROU, AMIKACIN in the last 72 hours.   Microbiology: Recent Results (from the past 720 hour(s))  Blood culture (routine x 2)     Status: None (Preliminary result)   Collection Time: 09/09/15  4:57 AM  Result Value Ref Range Status   Specimen Description BLOOD BLOOD LEFT FOREARM  Final   Special Requests BOTTLES DRAWN AEROBIC AND ANAEROBIC 5ML  Final   Culture NO GROWTH 2 DAYS  Final   Report Status PENDING  Incomplete  Blood culture (routine x 2)     Status: None (Preliminary result)   Collection Time: 09/09/15  4:57 AM  Result Value Ref Range Status   Specimen Description BLOOD RIGHT ANTECUBITAL  Final   Special Requests BOTTLES DRAWN AEROBIC AND ANAEROBIC 5ML  Final   Culture NO GROWTH 2 DAYS  Final   Report Status PENDING  Incomplete    Medical History: Past Medical History  Diagnosis Date  . Diabetes mellitus without complication ([place])   .  Hypertension   . Neuropathy ([place])   . GERD (gastroesophageal reflux disease)   . Hypothyroidism   . Breast cancer ([place])     breast-left  . Bed sore on heel   . Bed sore on elbow    Assessment: 69 yo female with PMH of DM, HTN and neuropathy brought to ED by EMS for lethargy and confusion. In ED patient was found to have elevated WBC of 24.7, was hypotensive with an infected decubitus ulcer. Sepsis protocol was initiated and pharmacy was consulted for dosing and monitoring of Vancomycin and Zosyn.   CrCl: 45 ml/min (improved from 25 mL/min)     Scr: 0.46 ( down from 1.45)  WBC: 10.2 (down from 20 on 12/27)  Goal of Therapy:  Vancomycin trough level 15-20 mcg/ml  Plan:   Ke: 0.042   T1/2: 16.5   Vd: 30  Will continue  Vancomycin 500mg  IV q18 hrs. Will order trough level prior to 4th dose on 12/30 @1530 . Calculated trough at Css 16.  Will continue  patient on Zosyn 3.375g IV q8 hr.   Pharmacy will continue to follow cultures and monitor labs and renal function. Dose adjustments will be made as needed.   Larene Beach, PharmD Clinical Pharmacist   09/12/2015,2:58 PM

## 2015-09-12 NOTE — Progress Notes (Signed)
ANTIBIOTIC CONSULT NOTE - Follow Up  Pharmacy Consult for Vancomycin and Zosyn  Indication: rule out sepsis  Allergies  Allergen Reactions  . Boniva [Ibandronic Acid]     Pain neuropathy worse  . Codeine Other (See Comments)    Nervous/hyper  . Erythromycin Other (See Comments)    "Liver damage"  . Levaquin [Levofloxacin In D5w] Other (See Comments)    Hallucinations    Patient Measurements: Height: 5\' 4"  (162.6 cm) Weight: 94 lb (42.638 kg) IBW/kg (Calculated) : 54.7  Vital Signs: Temp: 97.9 F (36.6 C) (12/30 1327) Temp Source: Oral (12/30 1327) BP: 145/72 mmHg (12/30 1327) Pulse Rate: 73 (12/30 1327)  Recent Labs  09/10/15 0427 09/11/15 0510 09/11/15 1822 09/12/15 0523  WBC 11.7* 10.2  --  8.4  HGB 7.3* 6.9* 9.7* 8.9*  PLT 270 310  --  302  CREATININE 0.62 0.46  --  0.51   Estimated Creatinine Clearance: 45.3 mL/min (by C-G formula based on Cr of 0.51). No results for input(s): VANCOTROUGH, VANCOPEAK, VANCORANDOM, GENTTROUGH, GENTPEAK, GENTRANDOM, TOBRATROUGH, TOBRAPEAK, TOBRARND, AMIKACINPEAK, AMIKACINTROU, AMIKACIN in the last 72 hours.   Microbiology: Recent Results (from the past 720 hour(s))  Blood culture (routine x 2)     Status: None (Preliminary result)   Collection Time: 09/09/15  4:57 AM  Result Value Ref Range Status   Specimen Description BLOOD BLOOD LEFT FOREARM  Final   Special Requests BOTTLES DRAWN AEROBIC AND ANAEROBIC 5ML  Final   Culture NO GROWTH 2 DAYS  Final   Report Status PENDING  Incomplete  Blood culture (routine x 2)     Status: None (Preliminary result)   Collection Time: 09/09/15  4:57 AM  Result Value Ref Range Status   Specimen Description BLOOD RIGHT ANTECUBITAL  Final   Special Requests BOTTLES DRAWN AEROBIC AND ANAEROBIC 5ML  Final   Culture NO GROWTH 2 DAYS  Final   Report Status PENDING  Incomplete    Medical History: Past Medical History  Diagnosis Date  . Diabetes mellitus without complication ([place])   .  Hypertension   . Neuropathy ([employer])   . GERD (gastroesophageal reflux disease)   . Hypothyroidism   . Breast cancer ([place])     breast-left  . Bed sore on heel   . Bed sore on elbow    Assessment: 69 yo female with PMH of DM, HTN and neuropathy brought to ED by EMS for lethargy and confusion. In ED patient was found to have elevated WBC of 24.7, was hypotensive with an infected decubitus ulcer. Sepsis protocol was initiated and pharmacy was consulted for dosing and monitoring of Vancomycin and Zosyn.   CrCl: 45 ml/min (improved from 25 mL/min)     Scr: 0.46 ( down from 1.45)  WBC: 10.2 (down from 20 on 12/27)  Goal of Therapy:  Vancomycin trough level 15-20 mcg/ml  Plan:   Ke: 0.042   T1/2: 16.5   Vd: 30  Will continue  Vancomycin 500mg  IV q18 hrs. Will order trough level prior to 4th dose on 12/30 @1530 . Calculated trough at Css 16.  Will continue  patient on Zosyn 3.375g IV q8 hr.   Pharmacy will continue to follow cultures and monitor labs and renal function. Dose adjustments will be made as needed.   12/30:  Vanc trough scheduled for 15:30 was never drawn.   Vanc dose given at 16:00.  Will order new trough to be drawn before next scheduled dose on 12/31 @ 9:30.  Wells D Clinical Pharmacist  09/12/2015,6:21 PM

## 2015-09-12 NOTE — Anesthesia Procedure Notes (Signed)
Date/Time: 09/12/2015 12:10 PM Performed by: Johnna Acosta Pre-anesthesia Checklist: Patient identified, Emergency Drugs available, Suction available, Patient being monitored and Timeout performed Patient Re-evaluated:Patient Re-evaluated prior to inductionOxygen Delivery Method: Nasal cannula

## 2015-09-12 NOTE — Progress Notes (Signed)
PT Attempt Note  Patient Details Name: [name] MRN: [id number] DOB: [birth year]   Cancelled Treatment:    Reason Eval/Treat Not Completed: Medical issues which prohibited therapy. Chart reviewed and RN consulted. Pt is leaving room at time of conversation with RN for EGD. Will attempt evaluation at later date/time as pt is appropriate for physical therapy.   Lyndel Safe Huprich PT, DPT   Huprich,Jason 09/12/2015, 10:34 AM

## 2015-09-12 NOTE — Consult Note (Signed)
[facility] Infectious Disease     Reason for Consult:  Sepsis   Referring Physician: sudini Date of Admission:  09/09/2015   Principal Problem:   Sepsis [facility]) Active Problems:   Decubitus ulcer, infected   HPI: [name] is a 69 y.o. female history of diabetes mellitus diet controlled, hypertension, neuropathy, decubitus ulcers admitted with lethargy and confusion. IN Ed was hypotensive, and wbc 24 k.  Her decub ulcers are managed at wound care center.  Has had issues with decreasing hgb as well requiring tranfusion. BCX negative. WBC decreased with vanco and zosyn. CT spine showed compression fx but no evidence discitis or osteomyelitis  Past Medical History  Diagnosis Date  . Diabetes mellitus without complication ([place])   . Hypertension   . Neuropathy ([place])   . GERD (gastroesophageal reflux disease)   . Hypothyroidism   . Breast cancer ([place])     breast-left  . Bed sore on heel   . Bed sore on elbow    Past Surgical History  Procedure Laterality Date  . Cholecystectomy    . Breast lumpectomy Left   . Orif humerus fracture Right 04/24/2015    Procedure: OPEN REDUCTION INTERNAL FIXATION (ORIF) PROXIMAL HUMERUS FRACTURE;  Surgeon: Corky Mull, MD;  Location: [facility] ORS;  Service: Orthopedics;  Laterality: Right;   Social History  Substance Use Topics  . Smoking status: Current Every Day Smoker -- 1.00 packs/day for 40 years  . Smokeless tobacco: None  . Alcohol Use: 0.0 oz/week    0 Standard drinks or equivalent per week     Comment: daily   Family History  Problem Relation Age of Onset  . Breast cancer Mother     Allergies:  Allergies  Allergen Reactions  . Boniva [Ibandronic Acid]     Pain neuropathy worse  . Codeine Other (See Comments)    Nervous/hyper  . Erythromycin Other (See Comments)    "Liver damage"  . Levaquin [Levofloxacin In D5w] Other (See Comments)    Hallucinations     Current antibiotics: Antibiotics Given (last 72 hours)     Date/Time Action Medication Dose Rate   09/09/15 2151 Given   piperacillin-tazobactam (ZOSYN) IVPB 3.375 g 3.375 g 12.5 mL/hr   09/10/15 0535 Given   piperacillin-tazobactam (ZOSYN) IVPB 3.375 g 3.375 g 12.5 mL/hr   09/10/15 9417 Given   vancomycin (VANCOCIN) 500 mg in sodium chloride 0.9 % 100 mL IVPB 500 mg 100 mL/hr   09/10/15 1718 Given   piperacillin-tazobactam (ZOSYN) IVPB 3.375 g 3.375 g 12.5 mL/hr   09/10/15 2201 Given   piperacillin-tazobactam (ZOSYN) IVPB 3.375 g 3.375 g 12.5 mL/hr   09/11/15 0503 Given   piperacillin-tazobactam (ZOSYN) IVPB 3.375 g 3.375 g 12.5 mL/hr   09/11/15 0503 Given   vancomycin (VANCOCIN) 500 mg in sodium chloride 0.9 % 100 mL IVPB 500 mg 100 mL/hr   09/11/15 1518 Given   piperacillin-tazobactam (ZOSYN) IVPB 3.375 g 3.375 g 12.5 mL/hr   09/11/15 2111 Given   vancomycin (VANCOCIN) 500 mg in sodium chloride 0.9 % 100 mL IVPB 500 mg 100 mL/hr   09/11/15 2124 Given   piperacillin-tazobactam (ZOSYN) IVPB 3.375 g 3.375 g 12.5 mL/hr   09/12/15 0526 Given   piperacillin-tazobactam (ZOSYN) IVPB 3.375 g 3.375 g 12.5 mL/hr   09/12/15 1347 Given   piperacillin-tazobactam (ZOSYN) IVPB 3.375 g 3.375 g 12.5 mL/hr      MEDICATIONS: . ALPRAZolam  0.25 mg Oral BID  . ALPRAZolam  0.25 mg Oral Once  .  feeding supplement (ENSURE ENLIVE)  237 mL Oral BID BM  . gabapentin  400 mg Oral TID  . magnesium oxide  400 mg Oral Daily  . olopatadine  1 drop Both Eyes BID  . pantoprazole  40 mg Oral Daily  . piperacillin-tazobactam (ZOSYN)  IV  3.375 g Intravenous 3 times per day  . polyethylene glycol  17 g Oral BID  . potassium chloride  40 mEq Oral BID  . sodium chloride  10-40 mL Intracatheter Q12H  . vancomycin  500 mg Intravenous Q18H    Review of Systems - 11 systems reviewed and negative per HPI   OBJECTIVE: Temp:  [96.8 F (36 C)-98.9 F (37.2 C)] 97.9 F (36.6 C) (12/30 1327) Pulse Rate:  [69-89] 73 (12/30 1327) Resp:  [12-22] 20 (12/30 1327) BP:  (108-145)/(61-89) 145/72 mmHg (12/30 1327) SpO2:  [97 %-100 %] 99 % (12/30 1327) Weight:  [42.638 kg (94 lb)] 42.638 kg (94 lb) (12/30 1046) Physical Exam  Constitutional:  oriented to person, place, and time. appears very thin,  HENT: [place]/AT, PERRLA, no scleral icterus Mouth/Throat: Oropharynx is clear and moist. No oropharyngeal exudate.  Cardiovascular: Normal rate, regular rhythm and normal heart sounds.  Pulmonary/Chest: Effort normal and breath sounds normal. No respiratory distress.  has no wheezes.  Neck supple, no nuchal rigidity Abdominal: Soft. Bowel sounds are normal.  exhibits no distension. There is no tenderness.  Lymphadenopathy: no cervical adenopathy. No axillary adenopathy Neurological: alert and oriented to person, place, and time.  Skin: Skin is warm and dry.R post back with a 2 cm stage 2 decub with some ttp and some drinage, R lateral thigh  With stg 2 ucler as well Psychiatric: a normal mood and affect.  behavior is normal.    LABS: Results for orders placed or performed during the hospital encounter of 09/09/15 (from the past 48 hour(s))  Comprehensive metabolic panel     Status: Abnormal   Collection Time: 09/11/15  5:10 AM  Result Value Ref Range   Sodium 137 135 - 145 mmol/L   Potassium 3.0 (L) 3.5 - 5.1 mmol/L   Chloride 112 (H) 101 - 111 mmol/L   CO2 20 (L) 22 - 32 mmol/L   Glucose, Bld 95 65 - 99 mg/dL   BUN 7 6 - 20 mg/dL   Creatinine, Ser 0.46 0.44 - 1.00 mg/dL   Calcium 8.7 (L) 8.9 - 10.3 mg/dL   Total Protein 5.2 (L) 6.5 - 8.1 g/dL   Albumin 2.9 (L) 3.5 - 5.0 g/dL   AST 56 (H) 15 - 41 U/L   ALT 26 14 - 54 U/L   Alkaline Phosphatase 185 (H) 38 - 126 U/L   Total Bilirubin 1.1 0.3 - 1.2 mg/dL   GFR calc non Af Amer >60 >60 mL/min   GFR calc Af Amer >60 >60 mL/min    Comment: (NOTE) The eGFR has been calculated using the CKD EPI equation. This calculation has not been validated in all clinical situations. eGFR's persistently <60 mL/min signify  possible Chronic Kidney Disease.    Anion gap 5 5 - 15  CBC with Differential/Platelet     Status: Abnormal   Collection Time: 09/11/15  5:10 AM  Result Value Ref Range   WBC 10.2 3.6 - 11.0 K/uL   RBC 2.36 (L) 3.80 - 5.20 MIL/uL   Hemoglobin 6.9 (L) 12.0 - 16.0 g/dL   HCT 20.4 (L) 35.0 - 47.0 %   MCV 86.6 80.0 - 100.0 fL  MCH 29.3 26.0 - 34.0 pg   MCHC 33.8 32.0 - 36.0 g/dL   RDW 18.9 (H) 11.5 - 14.5 %   Platelets 310 150 - 440 K/uL   Neutrophils Relative % 78 %   Neutro Abs 8.0 (H) 1.4 - 6.5 K/uL   Lymphocytes Relative 7 %   Lymphs Abs 0.7 (L) 1.0 - 3.6 K/uL   Monocytes Relative 11 %   Monocytes Absolute 1.1 (H) 0.2 - 0.9 K/uL   Eosinophils Relative 3 %   Eosinophils Absolute 0.3 0 - 0.7 K/uL   Basophils Relative 1 %   Basophils Absolute 0.1 0 - 0.1 K/uL  Magnesium     Status: Abnormal   Collection Time: 09/11/15  5:10 AM  Result Value Ref Range   Magnesium 1.4 (L) 1.7 - 2.4 mg/dL  Vitamin B12     Status: None   Collection Time: 09/11/15  5:10 AM  Result Value Ref Range   Vitamin B-12 257 180 - 914 pg/mL    Comment: (NOTE) This assay is not validated for testing neonatal or myeloproliferative syndrome specimens for Vitamin B12 levels. Performed at [facility]   Folate     Status: None   Collection Time: 09/11/15  5:10 AM  Result Value Ref Range   Folate 7.7 >5.9 ng/mL  Type and screen [place]     Status: None   Collection Time: 09/11/15  9:45 AM  Result Value Ref Range   ABO/RH(D) O POS    Antibody Screen NEG    Sample Expiration 09/14/2015    Unit Number [id number]    Blood Component Type RED CELLS,LR    Unit division 00    Status of Unit ISSUED,FINAL    Transfusion Status OK TO TRANSFUSE    Crossmatch Result Compatible   Prepare RBC     Status: None   Collection Time: 09/11/15  9:45 AM  Result Value Ref Range   Order Confirmation ORDER PROCESSED BY BLOOD BANK   Occult blood card to lab, stool RN will collect      Status: None   Collection Time: 09/11/15  4:21 PM  Result Value Ref Range   Fecal Occult Bld NEGATIVE NEGATIVE  Hemoglobin and hematocrit, blood     Status: Abnormal   Collection Time: 09/11/15  6:22 PM  Result Value Ref Range   Hemoglobin 9.7 (L) 12.0 - 16.0 g/dL    Comment: REPEATED TO VERIFY   HCT 28.2 (L) 35.0 - 47.0 %    Comment: Performed at [place]  CBC with Differential/Platelet     Status: Abnormal   Collection Time: 09/12/15  5:23 AM  Result Value Ref Range   WBC 8.4 3.6 - 11.0 K/uL   RBC 3.11 (L) 3.80 - 5.20 MIL/uL   Hemoglobin 8.9 (L) 12.0 - 16.0 g/dL   HCT 26.4 (L) 35.0 - 47.0 %   MCV 84.9 80.0 - 100.0 fL   MCH 28.8 26.0 - 34.0 pg   MCHC 34.0 32.0 - 36.0 g/dL   RDW 18.8 (H) 11.5 - 14.5 %   Platelets 302 150 - 440 K/uL   Neutrophils Relative % 65 %   Neutro Abs 5.5 1.4 - 6.5 K/uL   Lymphocytes Relative 14 %   Lymphs Abs 1.2 1.0 - 3.6 K/uL   Monocytes Relative 15 %   Monocytes Absolute 1.3 (H) 0.2 - 0.9 K/uL   Eosinophils Relative 5 %   Eosinophils Absolute 0.4 0 - 0.7 K/uL  Basophils Relative 1 %   Basophils Absolute 0.1 0 - 0.1 K/uL  Magnesium     Status: Abnormal   Collection Time: 09/12/15  5:23 AM  Result Value Ref Range   Magnesium 1.6 (L) 1.7 - 2.4 mg/dL  Basic metabolic panel     Status: Abnormal   Collection Time: 09/12/15  5:23 AM  Result Value Ref Range   Sodium 136 135 - 145 mmol/L   Potassium 3.8 3.5 - 5.1 mmol/L   Chloride 112 (H) 101 - 111 mmol/L   CO2 20 (L) 22 - 32 mmol/L   Glucose, Bld 102 (H) 65 - 99 mg/dL   BUN <5 (L) 6 - 20 mg/dL   Creatinine, Ser 0.51 0.44 - 1.00 mg/dL   Calcium 8.9 8.9 - 10.3 mg/dL   GFR calc non Af Amer >60 >60 mL/min   GFR calc Af Amer >60 >60 mL/min    Comment: (NOTE) The eGFR has been calculated using the CKD EPI equation. This calculation has not been validated in all clinical situations. eGFR's persistently <60 mL/min signify possible Chronic Kidney Disease.    Anion gap 4 (L) 5 -  15   No components found for: ESR, C REACTIVE PROTEIN MICRO: Recent Results (from the past 720 hour(s))  Blood culture (routine x 2)     Status: None (Preliminary result)   Collection Time: 09/09/15  4:57 AM  Result Value Ref Range Status   Specimen Description BLOOD BLOOD LEFT FOREARM  Final   Special Requests BOTTLES DRAWN AEROBIC AND ANAEROBIC 5ML  Final   Culture NO GROWTH 2 DAYS  Final   Report Status PENDING  Incomplete  Blood culture (routine x 2)     Status: None (Preliminary result)   Collection Time: 09/09/15  4:57 AM  Result Value Ref Range Status   Specimen Description BLOOD RIGHT ANTECUBITAL  Final   Special Requests BOTTLES DRAWN AEROBIC AND ANAEROBIC 5ML  Final   Culture NO GROWTH 2 DAYS  Final   Report Status PENDING  Incomplete    IMAGING: Ct Head Wo Contrast  09/09/2015  CLINICAL DATA:  Chronic weight loss, status post shoulder surgery, with ulcerations about the body. Loss of appetite. Initial encounter. EXAM: CT HEAD WITHOUT CONTRAST TECHNIQUE: Contiguous axial images were obtained from the base of the skull through the vertex without intravenous contrast. COMPARISON:  MRI of the brain performed 11/27/2014 FINDINGS: There is no evidence of acute infarction, mass lesion, or intra- or extra-axial hemorrhage on CT. Prominence of the ventricles and sulci reflects moderate cortical volume loss. Cerebellar atrophy is noted. Scattered periventricular and subcortical white matter change likely reflects small vessel ischemic microangiopathy. The brainstem and fourth ventricle are within normal limits. The basal ganglia are unremarkable in appearance. The cerebral hemispheres demonstrate grossly normal gray-white differentiation. No mass effect or midline shift is seen. There is no evidence of fracture; visualized osseous structures are unremarkable in appearance. The visualized portions of the orbits are within normal limits. The paranasal sinuses and mastoid air cells are  well-aerated. No significant soft tissue abnormalities are seen. IMPRESSION: 1. No acute intracranial pathology seen on CT. 2. Moderate cortical volume loss and scattered small vessel ischemic microangiopathy. Electronically Signed   By: Garald Balding M.D.   On: 09/09/2015 02:43   Ct Thoracic Spine W Contrast  09/11/2015  CLINICAL DATA:  Probable sepsis from decubitus ulcers.  Back pain. EXAM: CT THORACIC SPINE WITH CONTRAST; CT LUMBAR SPINE WITH CONTRAST TECHNIQUE: Multidetector CT imaging of the  thoracic spine was performed during intravenous contrast administration. Multiplanar CT image reconstructions were also generated; Multidetector CT imaging of the lumbar spine was performed with intravenous contrast administration. Multiplanar CT image reconstructions were also generated. CONTRAST:  100m OMNIPAQUE IOHEXOL 300 MG/ML  SOLN COMPARISON:  MRI lumbar spine 12/05/2014. FINDINGS: THORACIC: No thoracic compression fracture or subluxation. No intraspinal mass lesion is evident. No calcified intervertebral disc herniations of significance. No features suggestive of thoracic discitis or osteomyelitis. RIGHT lower lobe pneumonia with effusion. PICC line. No proximal rib fractures. LUMBAR: Severe L1 compression deformity. This has progressed with regard to loss of height from the prior MR. RIGHT L1 transverse process fracture also seen. Comminuted fracture with a prominent vertical cleft extending from anterior to posterior. Up to 8 mm retropulsion of bone into the canal resulting in severe LEFT greater than RIGHT stenosis. No facet capsule disruption is established, but ligamentous edema on prior MR suggests the possibility of dynamic instability with patient upright. Skeletal osteopenia, but no other compression deformities are seen. No features suggestive of lumbar discitis or osteomyelitis. Edema is noted in the soft tissues overlying the lower sacrum and LEFT buttock, but sacral osteomyelitis is not  established. Mild disc space narrowing L1-L2. The other intervertebral disc spaces are fairly well preserved. There is a RIGHT paracentral protrusion at L5-S1. Multifactorial subarticular zone narrowing at L4-5 on the RIGHT related to disc disease in facet arthropathy. Mild lower lumbar facet arthropathy without pars defects is noted. Moderate stool burden. Bladder distention. Progressive pancreatic duct dilatation. This is followed to or just proximal to the ampulla, without dominant obstructive mass. Prominent dorsal duct entering the duodenum, possibly representing pancreas divisum or a variant. Common duct felt to be upper normal but suboptimally evaluated. Findings are favored to represent the sequelae of pancreatitis, possibly related to pancreas divisum. Cholecystectomy. Aortic atherosclerosis. IMPRESSION: Severe L1 compression deformity with retropulsion and near complete loss of height anteriorly. There is progression of anterior wedging, with 8 mm of retropulsed bone fragment resulting in severe spinal stenosis. Facet joints remain anatomically aligned, but ligamentous edema between the L1 and L2 spinous processes was observed on prior MR, suggesting the potential for dynamic instability with patient upright. No  features of thoracic or lumbar discitis or osteomyelitis. Edema of the soft tissues overlying the sacrum and LEFT buttock, but sacral osteomyelitis not established. Progressive pancreatic duct dilatation. See discussion above. Electronically Signed   By: JStaci RighterM.D.   On: 09/11/2015 15:28   Ct Lumbar Spine W Contrast  09/11/2015  CLINICAL DATA:  Probable sepsis from decubitus ulcers.  Back pain. EXAM: CT THORACIC SPINE WITH CONTRAST; CT LUMBAR SPINE WITH CONTRAST TECHNIQUE: Multidetector CT imaging of the thoracic spine was performed during intravenous contrast administration. Multiplanar CT image reconstructions were also generated; Multidetector CT imaging of the lumbar spine was  performed with intravenous contrast administration. Multiplanar CT image reconstructions were also generated. CONTRAST:  862mOMNIPAQUE IOHEXOL 300 MG/ML  SOLN COMPARISON:  MRI lumbar spine 12/05/2014. FINDINGS: THORACIC: No thoracic compression fracture or subluxation. No intraspinal mass lesion is evident. No calcified intervertebral disc herniations of significance. No features suggestive of thoracic discitis or osteomyelitis. RIGHT lower lobe pneumonia with effusion. PICC line. No proximal rib fractures. LUMBAR: Severe L1 compression deformity. This has progressed with regard to loss of height from the prior MR. RIGHT L1 transverse process fracture also seen. Comminuted fracture with a prominent vertical cleft extending from anterior to posterior. Up to 8 mm retropulsion of bone into the  canal resulting in severe LEFT greater than RIGHT stenosis. No facet capsule disruption is established, but ligamentous edema on prior MR suggests the possibility of dynamic instability with patient upright. Skeletal osteopenia, but no other compression deformities are seen. No features suggestive of lumbar discitis or osteomyelitis. Edema is noted in the soft tissues overlying the lower sacrum and LEFT buttock, but sacral osteomyelitis is not established. Mild disc space narrowing L1-L2. The other intervertebral disc spaces are fairly well preserved. There is a RIGHT paracentral protrusion at L5-S1. Multifactorial subarticular zone narrowing at L4-5 on the RIGHT related to disc disease in facet arthropathy. Mild lower lumbar facet arthropathy without pars defects is noted. Moderate stool burden. Bladder distention. Progressive pancreatic duct dilatation. This is followed to or just proximal to the ampulla, without dominant obstructive mass. Prominent dorsal duct entering the duodenum, possibly representing pancreas divisum or a variant. Common duct felt to be upper normal but suboptimally evaluated. Findings are favored to  represent the sequelae of pancreatitis, possibly related to pancreas divisum. Cholecystectomy. Aortic atherosclerosis. IMPRESSION: Severe L1 compression deformity with retropulsion and near complete loss of height anteriorly. There is progression of anterior wedging, with 8 mm of retropulsed bone fragment resulting in severe spinal stenosis. Facet joints remain anatomically aligned, but ligamentous edema between the L1 and L2 spinous processes was observed on prior MR, suggesting the potential for dynamic instability with patient upright. No  features of thoracic or lumbar discitis or osteomyelitis. Edema of the soft tissues overlying the sacrum and LEFT buttock, but sacral osteomyelitis not established. Progressive pancreatic duct dilatation. See discussion above. Electronically Signed   By: Staci Righter M.D.   On: 09/11/2015 15:28   Dg Chest Port 1 View  09/10/2015  CLINICAL DATA:  PICC placement.  Sepsis. EXAM: PORTABLE CHEST 1 VIEW COMPARISON:  Chest x-ray dated 09/08/2009 FINDINGS: Right PICC is been inserted. The tip is 8 cm below the carina at the cavoatrial junction. There is focal linear atelectasis at the right lung base. The lungs are otherwise clear. No effusions. Old left posterior rib fractures. IMPRESSION: PICC tip at the cavoatrial junction. Focal atelectasis at the right lung base. Electronically Signed   By: Lorriane Shire M.D.   On: 09/10/2015 16:21   US Abdomen Limited Ruq  09/12/2015  CLINICAL DATA:  Abnormal liver enzymes EXAM: US ABDOMEN LIMITED - RIGHT UPPER QUADRANT COMPARISON:  CT abdomen September 10, 2009 FINDINGS: Gallbladder: Surgically absent. Common bile duct: Diameter: 1 mm. There is no appreciable extrahepatic biliary duct dilatation. Intrahepatic ducts appear borderline prominent, likely due to postcholecystectomy state. Liver: No focal lesion identified. Within normal limits in parenchymal echogenicity. Incidental note is made of a fairly small right pleural effusion.  IMPRESSION: Gallbladder absent. Borderline prominence of the intrahepatic biliary ducts may be due to post cholecystectomy state. There is no common bile duct dilatation. No focal liver lesions are identified. Incidental note is made of a fairly small right pleural effusion. Electronically Signed   By: Lowella Grip III M.D.   On: 09/12/2015 09:02    Assessment:   [name] is a 69 y.o. female admitted with AMS and found to have sepsis likely from back decub ulcers.  She had shoulder surgery in August and was immobile leading to the back wound, on upper back.  Has been following with wound center.  WBC has improved to 8. [place] neg. CT spine neg for deep infection.   I do think likley skin source for her sepsis as no other  obvious infection   Recommendations I have cultured her wounds and can base abx on the cultures Cont vanco and zosyn but can likely transition to oral doxycycline 10 bid for total 10 day abx course as long as wound cx does not have resistant pathogen.   Thank you very much for allowing me to participate in the care of this patient. Please call with questions.   Cheral Marker. [name], MD

## 2015-09-12 NOTE — Progress Notes (Signed)
Patient complaining of 10/10 back pain and stated that the Oxy IR is not effective and doesn't last long enough. Paged MD and per Dr. Lavetta Nielsen, one time dose of 2mg  IV morphine ordered. Nursing staff will continue to monitor. Earleen Reaper, RN

## 2015-09-12 NOTE — Care Management (Signed)
Per ID, patient can be transitioned to oral  doxycyline as long as wound culture does not have a resistant pathogen

## 2015-09-13 LAB — CBC
HCT: 27.8 % — ABNORMAL LOW (ref 35.0–47.0)
Hemoglobin: 9.5 g/dL — ABNORMAL LOW (ref 12.0–16.0)
MCH: 29.2 pg (ref 26.0–34.0)
MCHC: 34.3 g/dL (ref 32.0–36.0)
MCV: 85.3 fL (ref 80.0–100.0)
PLATELETS: 285 10*3/uL (ref 150–440)
RBC: 3.27 MIL/uL — AB (ref 3.80–5.20)
RDW: 18.8 % — ABNORMAL HIGH (ref 11.5–14.5)
WBC: 8.3 10*3/uL (ref 3.6–11.0)

## 2015-09-13 LAB — VANCOMYCIN, TROUGH: VANCOMYCIN TR: 5 ug/mL — AB (ref 10–20)

## 2015-09-13 MED ORDER — FENTANYL 25 MCG/HR TD PT72
25.0000 ug | MEDICATED_PATCH | TRANSDERMAL | Status: DC
  Administered 2015-09-13 – 2015-09-16 (×2): 25 ug via TRANSDERMAL
  Filled 2015-09-13 (×2): qty 1

## 2015-09-13 MED ORDER — VANCOMYCIN HCL IN DEXTROSE 750-5 MG/150ML-% IV SOLN
750.0000 mg | Freq: Two times a day (BID) | INTRAVENOUS | Status: DC
  Administered 2015-09-13 – 2015-09-15 (×4): 750 mg via INTRAVENOUS
  Filled 2015-09-13 (×5): qty 150

## 2015-09-13 MED ORDER — ALPRAZOLAM 0.25 MG PO TABS
0.2500 mg | ORAL_TABLET | Freq: Three times a day (TID) | ORAL | Status: DC | PRN
  Administered 2015-09-13 – 2015-09-15 (×5): 0.25 mg via ORAL
  Filled 2015-09-13 (×5): qty 1

## 2015-09-13 NOTE — Progress Notes (Signed)
PT Cancellation Note  Patient Details Name: [name] MRN: [id number] DOB: July 02, 1946   Cancelled Treatment:    Reason Eval/Treat Not Completed: Pain limiting ability to participate.  Pt declining PT due to too much pain with any movement/activity (pt reports getting up to commode but that is all she is going to do in the hospital d/t having too much pain with movement).  Pt reports that she does stairs at home and walks with walker (although it is noted that pt has decubitus ulcers thoracic-spine and B trochanters).  Discussed imaging results of spine with MD Anselm Jungling and possible need for ortho consult ("Severe L1 compression deformity with retropulsion and near complete loss of height anteriorly. There is progression of anterior wedging, with 8 mm of retropulsed bone fragment resulting in severe spinal stenosis. Facet joints remain anatomically aligned, but ligamentous edema between the L1 and L2 spinous processes was observed on prior MR, suggesting the potential for dynamic instability with patient upright").  Will re-attempt PT eval at a later date/time as medically appropriate.   [name] 09/13/2015, 3:46 PM Leitha Bleak, [place]

## 2015-09-13 NOTE — Progress Notes (Signed)
Patient stated that she had a much better night than previously. Oxy IR 10mg  given q4h and VS stable. No other signs or symptoms of discomfort or distress noted. Husband at bedside. Nursing staff will continue to monitor. Earleen Reaper, RN

## 2015-09-13 NOTE — Progress Notes (Signed)
ANTIBIOTIC CONSULT NOTE - Follow Up  Pharmacy Consult for Vancomycin and Zosyn  Indication: rule out sepsis  Allergies  Allergen Reactions  . Boniva [Ibandronic Acid]     Pain neuropathy worse  . Codeine Other (See Comments)    Nervous/hyper  . Erythromycin Other (See Comments)    "Liver damage"  . Levaquin [Levofloxacin In D5w] Other (See Comments)    Hallucinations    Patient Measurements: Height: 5\' 4"  (162.6 cm) Weight: 94 lb (42.638 kg) IBW/kg (Calculated) : 54.7  Vital Signs: Temp: 98.7 F (37.1 C) (12/31 0452) Temp Source: Oral (12/31 0452) BP: 123/71 mmHg (12/31 0452) Pulse Rate: 85 (12/31 0452)  Recent Labs  09/11/15 0510 09/11/15 1822 09/12/15 0523  WBC 10.2  --  8.4  HGB 6.9* 9.7* 8.9*  PLT 310  --  302  CREATININE 0.46  --  0.51   Estimated Creatinine Clearance: 45.3 mL/min (by C-G formula based on Cr of 0.51).  Recent Labs  09/13/15 0915  Petrey 5*     Microbiology: Recent Results (from the past 720 hour(s))  Blood culture (routine x 2)     Status: None (Preliminary result)   Collection Time: 09/09/15  4:57 AM  Result Value Ref Range Status   Specimen Description BLOOD BLOOD LEFT FOREARM  Final   Special Requests BOTTLES DRAWN AEROBIC AND ANAEROBIC 5ML  Final   Culture NO GROWTH 2 DAYS  Final   Report Status PENDING  Incomplete  Blood culture (routine x 2)     Status: None (Preliminary result)   Collection Time: 09/09/15  4:57 AM  Result Value Ref Range Status   Specimen Description BLOOD RIGHT ANTECUBITAL  Final   Special Requests BOTTLES DRAWN AEROBIC AND ANAEROBIC 5ML  Final   Culture NO GROWTH 2 DAYS  Final   Report Status PENDING  Incomplete  Wound culture     Status: None (Preliminary result)   Collection Time: 09/12/15  2:49 PM  Result Value Ref Range Status   Specimen Description WOUND  Final   Special Requests Normal  Final   Gram Stain PENDING  Incomplete   Culture   Final    MODERATE GROWTH BETA HEMOLYTIC ORGANISM  BEING IDENTIFIED IDENTIFICATION TO FOLLOW    Report Status PENDING  Incomplete    Medical History: Past Medical History  Diagnosis Date  . Diabetes mellitus without complication ([place])   . Hypertension   . Neuropathy ([place])   . GERD (gastroesophageal reflux disease)   . Hypothyroidism   . Breast cancer ([place])     breast-left  . Bed sore on heel   . Bed sore on elbow    Assessment: 69 yo female with PMH of DM, HTN and neuropathy brought to ED by EMS for lethargy and confusion. In ED patient was found to have elevated WBC of 24.7, was hypotensive with an infected decubitus ulcer. Sepsis protocol was initiated and pharmacy was consulted for dosing and monitoring of Vancomycin and Zosyn.   Vancomycin trough= 5 mcg/ml  Goal of Therapy:  Vancomycin trough level 15-20 mcg/ml  Plan:  Vancomycin trough is below goal so will increase vancomycin dose to 750 mg iv q 12 hours. Will check the next vancomycin trough with the 5th dose of the new regimen.   Will continue  patient on Zosyn 3.375g IV q8 hr.   Pharmacy will continue to follow cultures and monitor labs and renal function. Dose adjustments will be made as needed.   Ulice Dash, PharmD  Clinical Pharmacist  09/13/2015,10:20 AM

## 2015-09-13 NOTE — Progress Notes (Signed)
[facility] at [place] NAME: [name]    MR#:  [id number]  DATE OF BIRTH:  October 17, 1945  SUBJECTIVE:  CHIEF COMPLAINT:   Chief Complaint  Patient presents with  . Altered Mental Status    EMS pt from home it possible pt took too many pain meds last took 1 hour PTA     EGD done with out any clear findings of bleeding.   Still have pain. Hb stable, No fever, said her appetite is decreased for last few weeks.   REVIEW OF SYSTEMS:    Review of Systems  Constitutional: Positive for chills and malaise/fatigue. Negative for fever.  HENT: Negative for sore throat.   Eyes: Negative for blurred vision, double vision and pain.  Respiratory: Negative for cough, hemoptysis, shortness of breath and wheezing.   Cardiovascular: Negative for chest pain, palpitations, orthopnea and leg swelling.  Gastrointestinal: Negative for heartburn, nausea, vomiting, abdominal pain, diarrhea and constipation.  Genitourinary: Negative for dysuria and hematuria.  Musculoskeletal: Positive for back pain. Negative for joint pain.  Skin: Negative for rash.  Neurological: Positive for weakness. Negative for sensory change, speech change, focal weakness and headaches.  Endo/Heme/Allergies: Does not bruise/bleed easily.  Psychiatric/Behavioral: Negative for depression. The patient is not nervous/anxious.       DRUG ALLERGIES:   Allergies  Allergen Reactions  . Boniva [Ibandronic Acid]     Pain neuropathy worse  . Codeine Other (See Comments)    Nervous/hyper  . Erythromycin Other (See Comments)    "Liver damage"  . Levaquin [Levofloxacin In D5w] Other (See Comments)    Hallucinations     VITALS:  Blood pressure 150/84, pulse 89, temperature 98 F (36.7 C), temperature source Oral, resp. rate 18, height 5\' 4"  (1.626 m), weight 42.638 kg (94 lb), SpO2 100 %.  PHYSICAL EXAMINATION:   Physical Exam  GENERAL:  69 y.o.-year-old patient lying in the  bed with no acute distress.  EYES: Pupils equal, round, reactive to light and accommodation. No scleral icterus. Extraocular muscles intact.  HEENT: Head atraumatic, normocephalic. Oropharynx and nasopharynx clear.  NECK:  Supple, no jugular venous distention. No thyroid enlargement, no tenderness.  LUNGS: Normal breath sounds bilaterally, no wheezing, rales, rhonchi. No use of accessory muscles of respiration.  CARDIOVASCULAR: S1, S2 normal. No murmurs, rubs, or gallops.  ABDOMEN: Soft, nontender, nondistended. Bowel sounds present. No organomegaly or mass.  EXTREMITIES: No cyanosis, clubbing or edema b/l.    NEUROLOGIC: Cranial nerves II through XII are intact. B/l lower limbs weakness.  PSYCHIATRIC: The patient is alert and oriented x 3.  SKIN: Decubitus ulcers on upper back, sacrum, bilateral hips.   LABORATORY PANEL:   CBC  Recent Labs Lab 09/13/15 0915  WBC 8.3  HGB 9.5*  HCT 27.8*  PLT 285   ------------------------------------------------------------------------------------------------------------------  Chemistries   Recent Labs Lab 09/11/15 0510 09/12/15 0523  NA 137 136  K 3.0* 3.8  CL 112* 112*  CO2 20* 20*  GLUCOSE 95 102*  BUN 7 <5*  CREATININE 0.46 0.51  CALCIUM 8.7* 8.9  MG 1.4* 1.6*  AST 56*  --   ALT 26  --   ALKPHOS 185*  --   BILITOT 1.1  --    ------------------------------------------------------------------------------------------------------------------  Cardiac Enzymes  Recent Labs Lab 09/09/15 1328  TROPONINI 0.05*   ------------------------------------------------------------------------------------------------------------------  RADIOLOGY:  US Abdomen Limited Ruq  09/12/2015  CLINICAL DATA:  Abnormal liver enzymes EXAM: US ABDOMEN LIMITED - RIGHT UPPER QUADRANT  COMPARISON:  CT abdomen September 10, 2009 FINDINGS: Gallbladder: Surgically absent. Common bile duct: Diameter: 1 mm. There is no appreciable extrahepatic biliary duct  dilatation. Intrahepatic ducts appear borderline prominent, likely due to postcholecystectomy state. Liver: No focal lesion identified. Within normal limits in parenchymal echogenicity. Incidental note is made of a fairly small right pleural effusion. IMPRESSION: Gallbladder absent. Borderline prominence of the intrahepatic biliary ducts may be due to post cholecystectomy state. There is no common bile duct dilatation. No focal liver lesions are identified. Incidental note is made of a fairly small right pleural effusion. Electronically Signed   By: Lowella Grip III M.D.   On: 09/12/2015 09:02     ASSESSMENT AND PLAN:   69 year old female patient with history of diabetes mellitus, breast cancer, neuropathy,GERD, hypothyroidism presented to the emergency room with lethargy, confusion and low blood pressure.  # Sepsis likely source is decubitus ulcers On IV abx. WBC  trending down Afebrile now Bcx - NGTD. Ordered MRI spine but patient refused as she is unable to lay flat with pain from ulcer. Appreciated ID consult.  sent culture from the ulcer.  # Microcytic iron deficiency anemia Blood in stool. Transfuse 1 unit PRBC for worsening symptomatic anemia. Monitor Hb- stable now. Consulted GI.   EGD done- no clear source- She had negatvie Colonoscopy in last 4 years.  # History of breast cancer Follow-up with oncology as outpatient.  # Severe dehydration due to decreased intake from nausea Contributed to her hypotension and lethargic.  hydrated and alert now.  # Acute encephalopathy present on admission due to infection has resolved.  # DVT prophylaxis with heparin  # compression fracture L1- and causing her to be bed bound.   Chronic issue.   All the records are reviewed and case discussed with Care Management/Social Workerr. Management plans discussed with the patient, family and they are in agreement.  CODE STATUS: FULL  DVT Prophylaxis: SCDs  TOTAL TIME TAKING CARE OF  THIS PATIENT: 35 minutes.   POSSIBLE D/C IN 2-3 DAYS, DEPENDING ON CLINICAL CONDITION.   Vaughan Basta M.D on 09/13/2015 at 2:44 PM  Between 7am to 6pm - Pager - [phone]  After 6pm go to [email or web address] - password [email or web address] [facility]  Office  [phone]  CC: Primary care physician; [name], MD    Note: This dictation was prepared with Dragon dictation along with smaller phrase technology. Any transcriptional errors that result from this process are unintentional.

## 2015-09-14 LAB — CULTURE, BLOOD (ROUTINE X 2)
CULTURE: NO GROWTH
Culture: NO GROWTH

## 2015-09-14 MED ORDER — MORPHINE SULFATE ER 15 MG PO TBCR
15.0000 mg | EXTENDED_RELEASE_TABLET | Freq: Two times a day (BID) | ORAL | Status: DC
  Administered 2015-09-14 – 2015-09-15 (×2): 15 mg via ORAL
  Filled 2015-09-14 (×2): qty 1

## 2015-09-14 MED ORDER — MEGESTROL ACETATE 40 MG PO TABS
40.0000 mg | ORAL_TABLET | Freq: Every day | ORAL | Status: DC
  Administered 2015-09-15 – 2015-09-16 (×2): 40 mg via ORAL
  Filled 2015-09-14 (×3): qty 1

## 2015-09-14 NOTE — Progress Notes (Signed)
PT Cancellation Note  Patient Details Name: [name] MRN: [id number] DOB: 1945/12/23   Cancelled Treatment:    Reason Eval/Treat Not Completed: Other (comment). Pt very sternly refused therapy again this date. Will re-attempt tomorrow. Pt complains of pain from wounds and reports she will not ambulate until she gets home because it was unsafe to ambulate in hospital. Therapist provided encouragement and education on benefit of ambulation, however pt continues to refuse. She reports she lives with her husband and uses rollater at baseline. TLSO brace not in room at this time, however per RN will not receive until dc from hospital.   Tuwana Kapaun 09/14/2015, 1:31 PM  Greggory Stallion, PT, DPT [phone]

## 2015-09-14 NOTE — Progress Notes (Signed)
A&O. Up with one assist. PICC line in place. IV antibiotics. Multiple dressings in place on abdomen and hips. Oxy IR given for pain.

## 2015-09-14 NOTE — Anesthesia Postprocedure Evaluation (Signed)
Anesthesia Post Note  Patient: [name]  Procedure(s) Performed: Procedure(s) (LRB): ESOPHAGOGASTRODUODENOSCOPY (EGD) (N/A)  Patient location during evaluation: Endoscopy Anesthesia Type: General Level of consciousness: awake and alert Pain management: pain level controlled Vital Signs Assessment: post-procedure vital signs reviewed and stable Respiratory status: spontaneous breathing, nonlabored ventilation, respiratory function stable and patient connected to nasal cannula oxygen Cardiovascular status: blood pressure returned to baseline and stable Postop Assessment: no signs of nausea or vomiting Anesthetic complications: no    Last Vitals:  Filed Vitals:   09/14/15 0900 09/14/15 1244  BP: 149/92 133/83  Pulse: 96 99  Temp: 36.8 C 37.1 C  Resp: 18 18    Last Pain:  Filed Vitals:   09/14/15 1356  PainSc: 4                  Joseph K Piscitello

## 2015-09-14 NOTE — Consult Note (Signed)
ORTHOPAEDIC CONSULTATION  REQUESTING PHYSICIAN: Vaughan Basta, MD  Chief Complaint: low back pain  HPI: [name] is a 70 y.o. female who complains of  Chronic low back pain. She sustained an L1 compression fracture in August 2015 from a fall. She underwent right humerus ORIF in Aug 2015 and states that her recovery was complicated by a decubitus ulcer on her back at that time and then subsequent bilateral hip pressure ulcers that she states developed from sleeping on her sides to avoid pressure on her back ulcer. She was admitted for septic picture secondary to infected ulcer on her back and has medically stabilized since admission. She denies any low back pain at this time and states that she has no pain while in bed, and just her baseline pain when she is up and moving around. She denies any fevers or chills. She denies any loss of bowel or bladder function or saddle anesthesia. She denies any chest pain or shortness of breath. She denies any recent weight loss or night sweats.  Past Medical History  Diagnosis Date  . Diabetes mellitus without complication ([place])   . Hypertension   . Neuropathy ([place])   . GERD (gastroesophageal reflux disease)   . Hypothyroidism   . Breast cancer ([place])     breast-left  . Bed sore on heel   . Bed sore on elbow    Past Surgical History  Procedure Laterality Date  . Cholecystectomy    . Breast lumpectomy Left   . Orif humerus fracture Right 04/24/2015    Procedure: OPEN REDUCTION INTERNAL FIXATION (ORIF) PROXIMAL HUMERUS FRACTURE;  Surgeon: Corky Mull, MD;  Location: [facility] ORS;  Service: Orthopedics;  Laterality: Right;   Social History   Social History  . Marital Status: Married    Spouse Name: N/A  . Number of Children: N/A  . Years of Education: N/A   Occupational History  . retired    Social History Main Topics  . Smoking status: Current Every Day Smoker -- 1.00 packs/day for 40 years  . Smokeless tobacco: None  . Alcohol Use:  0.0 oz/week    0 Standard drinks or equivalent per week     Comment: daily  . Drug Use: No  . Sexual Activity: Not Asked   Other Topics Concern  . None   Social History Narrative   Family History  Problem Relation Age of Onset  . Breast cancer Mother    Allergies  Allergen Reactions  . Boniva [Ibandronic Acid]     Pain neuropathy worse  . Codeine Other (See Comments)    Nervous/hyper  . Erythromycin Other (See Comments)    "Liver damage"  . Levaquin [Levofloxacin In D5w] Other (See Comments)    Hallucinations    Prior to Admission medications   Medication Sig Start Date End Date Taking? Authorizing Provider  ALPRAZolam (XANAX) 0.25 MG tablet Take 0.25 mg by mouth 2 (two) times daily.   Yes Historical Provider, MD  fexofenadine (ALLEGRA) 180 MG tablet Take 180 mg by mouth daily.   Yes Historical Provider, MD  gabapentin (NEURONTIN) 400 MG capsule Take 400 mg by mouth 3 (three) times daily.   Yes Historical Provider, MD  HYDROcodone-acetaminophen (NORCO/VICODIN) 5-325 MG per tablet Take 1 tablet by mouth every 6 (six) hours as needed for moderate pain.   Yes Historical Provider, MD  ibuprofen (ADVIL,MOTRIN) 200 MG tablet Take 200 mg by mouth every 6 (six) hours as needed.   Yes Historical Provider, MD  lansoprazole (PREVACID) 30 MG capsule Take 30 mg by mouth daily at 12 noon.   Yes Historical Provider, MD  metoprolol succinate (TOPROL-XL) 100 MG 24 hr tablet Take 100 mg by mouth daily. Take with or immediately following a meal.   Yes Historical Provider, MD  mometasone (NASONEX) 50 MCG/ACT nasal spray Place 2 sprays into the nose daily.   Yes Historical Provider, MD  nitrofurantoin (MACRODANTIN) 50 MG capsule Take 50 mg by mouth daily.   Yes Historical Provider, MD  olopatadine (PATANOL) 0.1 % ophthalmic solution Place 1 drop into both eyes 2 (two) times daily.   Yes Historical Provider, MD  oxyCODONE (OXY IR/ROXICODONE) 5 MG immediate release tablet Take 1-2 tablets (5-10 mg  total) by mouth every 4 (four) hours as needed for breakthrough pain. 04/26/15  Yes Lattie Corns, PA-C  zaleplon (SONATA) 10 MG capsule Take 10 mg by mouth at bedtime as needed for sleep.   Yes Historical Provider, MD   US Abdomen Limited Ruq  09/12/2015  CLINICAL DATA:  Abnormal liver enzymes EXAM: US ABDOMEN LIMITED - RIGHT UPPER QUADRANT COMPARISON:  CT abdomen September 10, 2009 FINDINGS: Gallbladder: Surgically absent. Common bile duct: Diameter: 1 mm. There is no appreciable extrahepatic biliary duct dilatation. Intrahepatic ducts appear borderline prominent, likely due to postcholecystectomy state. Liver: No focal lesion identified. Within normal limits in parenchymal echogenicity. Incidental note is made of a fairly small right pleural effusion. IMPRESSION: Gallbladder absent. Borderline prominence of the intrahepatic biliary ducts may be due to post cholecystectomy state. There is no common bile duct dilatation. No focal liver lesions are identified. Incidental note is made of a fairly small right pleural effusion. Electronically Signed   By: Lowella Grip III M.D.   On: 09/12/2015 09:02   CT L-SPINE: L1 compression/burst fracture with complete loss of anterior column height and mild retropulsion of the middle column, along with marked thoracolumbar junctional kyphosis.  Positive ROS: All other systems have been reviewed and were otherwise negative with the exception of those mentioned in the HPI and as above.  Physical Exam: General: Alert, no acute distress Cardiovascular: No pedal edema Respiratory: No cyanosis, no use of accessory musculature GI: No organomegaly, abdomen is soft and non-tender Skin: No lesions in the area of chief complaint Neurologic: Sensation intact distally Psychiatric: Patient is competent for consent with normal mood and affect Lymphatic: No axillary or cervical lymphadenopathy  MUSCULOSKELETAL: She is intact in all sensory dermatomes in bilateral  lower extremities with the exception of some bilateral decreased plantar foot sensation, which she states in chronic secondary to her peripheral neuropathy. She has 5/5 strength to quad/ hamstring/ tib ant/ gastrocsoleus/ EHL. She refused to roll to allow me to examine her back secondary to pain and discomfort while on her side.  Assessment: 70 y/o with chronic L1 compression fracture with progressive collapse and kyphosis, now with middle column involvement and mild retropulsion into the canal with normal neurologic exam.  Plan: - MRI to rule out abscess or infectious process causing collapse. - TLSO brace when upright. Can contact [facility] in [place]) [id number] for brace fitting - Referral to Orthopaedic Spine surgeon upon discharge for further management of her back       09/14/2015 8:13 AM

## 2015-09-14 NOTE — Progress Notes (Signed)
[facility] at [place] NAME: Scherry Broussard    MR#:  [id number]  DATE OF BIRTH:  May 21, 1946  SUBJECTIVE:  CHIEF COMPLAINT:   Chief Complaint  Patient presents with  . Altered Mental Status    EMS pt from home it possible pt took too many pain meds last took 1 hour PTA     EGD done with out any clear findings of bleeding.   Still have pain. Hb stable, No fever, said her appetite is decreased for last few weeks.  is crying due to pain, and not able to stay flat in bed.  ortho suggested MRI spine again- but she said- she could not lay flat even for a minute.   REVIEW OF SYSTEMS:    Review of Systems  Constitutional: Positive for chills and malaise/fatigue. Negative for fever.  HENT: Negative for sore throat.   Eyes: Negative for blurred vision, double vision and pain.  Respiratory: Negative for cough, hemoptysis, shortness of breath and wheezing.   Cardiovascular: Negative for chest pain, palpitations, orthopnea and leg swelling.  Gastrointestinal: Negative for heartburn, nausea, vomiting, abdominal pain, diarrhea and constipation.  Genitourinary: Negative for dysuria and hematuria.  Musculoskeletal: Positive for back pain. Negative for joint pain.  Skin: Negative for rash.  Neurological: Positive for weakness. Negative for sensory change, speech change, focal weakness and headaches.  Endo/Heme/Allergies: Does not bruise/bleed easily.  Psychiatric/Behavioral: Negative for depression. The patient is not nervous/anxious.     DRUG ALLERGIES:   Allergies  Allergen Reactions  . Boniva [Ibandronic Acid]     Pain neuropathy worse  . Codeine Other (See Comments)    Nervous/hyper  . Erythromycin Other (See Comments)    "Liver damage"  . Levaquin [Levofloxacin In D5w] Other (See Comments)    Hallucinations     VITALS:  Blood pressure 69/69, pulse 99, temperature 98.7 F (37.1 C), temperature source Oral, resp. rate 18, height 5'  4" (1.626 m), weight 42.638 kg (94 lb), SpO2 100 %.  PHYSICAL EXAMINATION:   Physical Exam  GENERAL:  70 y.o.-year-old patient lying in the bed with no acute distress.  EYES: Pupils equal, round, reactive to light and accommodation. No scleral icterus. Extraocular muscles intact.  HEENT: Head atraumatic, normocephalic. Oropharynx and nasopharynx clear.  NECK:  Supple, no jugular venous distention. No thyroid enlargement, no tenderness.  LUNGS: Normal breath sounds bilaterally, no wheezing, rales, rhonchi. No use of accessory muscles of respiration.  CARDIOVASCULAR: S1, S2 normal. No murmurs, rubs, or gallops.  ABDOMEN: Soft, nontender, nondistended. Bowel sounds present. No organomegaly or mass.  EXTREMITIES: No cyanosis, clubbing or edema b/l.    NEUROLOGIC: Cranial nerves II through XII are intact. B/l lower limbs weakness.  PSYCHIATRIC: The patient is alert and oriented x 3.  SKIN: Decubitus ulcers on upper back, sacrum, bilateral hips.   LABORATORY PANEL:   CBC  Recent Labs Lab 09/13/15 0915  WBC 8.3  HGB 9.5*  HCT 27.8*  PLT 285   ------------------------------------------------------------------------------------------------------------------  Chemistries   Recent Labs Lab 09/11/15 0510 09/12/15 0523  NA 137 136  K 3.0* 3.8  CL 112* 112*  CO2 20* 20*  GLUCOSE 95 102*  BUN 7 <5*  CREATININE 0.46 0.51  CALCIUM 8.7* 8.9  MG 1.4* 1.6*  AST 56*  --   ALT 26  --   ALKPHOS 185*  --   BILITOT 1.1  --    ------------------------------------------------------------------------------------------------------------------  Cardiac Enzymes  Recent Labs Lab 09/09/15 1328  TROPONINI 0.05*   ------------------------------------------------------------------------------------------------------------------  RADIOLOGY:  No results found.   ASSESSMENT AND PLAN:   70 year old female patient with history of diabetes mellitus, breast cancer, neuropathy,GERD,  hypothyroidism presented to the emergency room with lethargy, confusion and low blood pressure.  # Sepsis likely source is decubitus ulcers- may have infected vertebra- but can not rule out with out MRI spine. On IV abx. WBC  trending down Afebrile now Bcx - NGTD. Ordered MRI spine but patient refused as she is unable to lay flat with pain from ulcer. Appreciated ID consult.  sent culture from the ulcer. Showed beta hemolytic strep.  appreciated Ortho consult.  # Microcytic iron deficiency anemia Blood in stool. Transfuse 1 unit PRBC for worsening symptomatic anemia. Monitor Hb- stable now. Consulted GI.   EGD done- no clear source- She had negatvie Colonoscopy in last 4 years.  monitor Hb.  # History of breast cancer Follow-up with oncology as outpatient.  # Severe dehydration due to decreased intake from nausea Contributed to her hypotension and lethargic.  hydrated and alert now.  # Acute encephalopathy present on admission due to infection has resolved.  # DVT prophylaxis with heparin  # compression fracture L1- and causing her to be bed bound.   Chronic issue.   But as per CT - some worsening - called ortho consult.   He suggested MRI spine- which may not be possible due to severe pain    Encouraged pt to follow with spine surgeon in [place] as she was referred to him in past.   She will follow once her wound is little better and she is able to sit again.  # severe back pain   Likely due to infected Ulcer- added fentanyl patch. Added MS contin.  All the records are reviewed and case discussed with Care Management/Social Workerr. Management plans discussed with the patient, family and they are in agreement.  CODE STATUS: FULL  DVT Prophylaxis: SCDs  TOTAL TIME TAKING CARE OF THIS PATIENT: 35 minutes.   POSSIBLE D/C IN 2-3 DAYS, DEPENDING ON CLINICAL CONDITION. Husband in room- explained him the plan.  Vaughan Basta M.D on 09/14/2015 at 4:40  PM  Between 7am to 6pm - Pager - [phone]  After 6pm go to [email or web address] - password [email or web address] [facility]  Office  [phone]  CC: Primary care physician; [name], MD    Note: This dictation was prepared with Dragon dictation along with smaller phrase technology. Any transcriptional errors that result from this process are unintentional.

## 2015-09-14 NOTE — Progress Notes (Signed)
[facility] NAME: [name]    MR#:  [id number]  DATE OF BIRTH:  28-Jul-1946  SUBJECTIVE:  CHIEF COMPLAINT:   Chief Complaint  Patient presents with  . Altered Mental Status    EMS pt from home it possible pt took too many pain meds last took 1 hour PTA     EGD done with out any clear findings of bleeding.   Still have pain. Hb stable, No fever, said her appetite is decreased for last few weeks.  is crying due to pain, and not able to stay flat in bed.   REVIEW OF SYSTEMS:    Review of Systems  Constitutional: Positive for chills and malaise/fatigue. Negative for fever.  HENT: Negative for sore throat.   Eyes: Negative for blurred vision, double vision and pain.  Respiratory: Negative for cough, hemoptysis, shortness of breath and wheezing.   Cardiovascular: Negative for chest pain, palpitations, orthopnea and leg swelling.  Gastrointestinal: Negative for heartburn, nausea, vomiting, abdominal pain, diarrhea and constipation.  Genitourinary: Negative for dysuria and hematuria.  Musculoskeletal: Positive for back pain. Negative for joint pain.  Skin: Negative for rash.  Neurological: Positive for weakness. Negative for sensory change, speech change, focal weakness and headaches.  Endo/Heme/Allergies: Does not bruise/bleed easily.  Psychiatric/Behavioral: Negative for depression. The patient is not nervous/anxious.       DRUG ALLERGIES:   Allergies  Allergen Reactions  . Boniva [Ibandronic Acid]     Pain neuropathy worse  . Codeine Other (See Comments)    Nervous/hyper  . Erythromycin Other (See Comments)    "Liver damage"  . Levaquin [Levofloxacin In D5w] Other (See Comments)    Hallucinations     VITALS:  Blood pressure 133/83, pulse 99, temperature 98.7 F (37.1 C), temperature source Oral, resp. rate 18, height 5\' 4"  (1.626 m), weight 42.638 kg (94 lb), SpO2 100 %.  PHYSICAL EXAMINATION:    Physical Exam  GENERAL:  70 y.o.-year-old patient lying in the bed with no acute distress.  EYES: Pupils equal, round, reactive to light and accommodation. No scleral icterus. Extraocular muscles intact.  HEENT: Head atraumatic, normocephalic. Oropharynx and nasopharynx clear.  NECK:  Supple, no jugular venous distention. No thyroid enlargement, no tenderness.  LUNGS: Normal breath sounds bilaterally, no wheezing, rales, rhonchi. No use of accessory muscles of respiration.  CARDIOVASCULAR: S1, S2 normal. No murmurs, rubs, or gallops.  ABDOMEN: Soft, nontender, nondistended. Bowel sounds present. No organomegaly or mass.  EXTREMITIES: No cyanosis, clubbing or edema b/l.    NEUROLOGIC: Cranial nerves II through XII are intact. B/l lower limbs weakness.  PSYCHIATRIC: The patient is alert and oriented x 3.  SKIN: Decubitus ulcers on upper back, sacrum, bilateral hips.   LABORATORY PANEL:   CBC  Recent Labs Lab 09/13/15 0915  WBC 8.3  HGB 9.5*  HCT 27.8*  PLT 285   ------------------------------------------------------------------------------------------------------------------  Chemistries   Recent Labs Lab 09/11/15 0510 09/12/15 0523  NA 137 136  K 3.0* 3.8  CL 112* 112*  CO2 20* 20*  GLUCOSE 95 102*  BUN 7 <5*  CREATININE 0.46 0.51  CALCIUM 8.7* 8.9  MG 1.4* 1.6*  AST 56*  --   ALT 26  --   ALKPHOS 185*  --   BILITOT 1.1  --    ------------------------------------------------------------------------------------------------------------------  Cardiac Enzymes  Recent Labs Lab 09/09/15 1328  TROPONINI 0.05*   ------------------------------------------------------------------------------------------------------------------  RADIOLOGY:  No results found.   ASSESSMENT AND  PLAN:   70 year old female patient with history of diabetes mellitus, breast cancer, neuropathy,GERD, hypothyroidism presented to the emergency room with lethargy, confusion and low  blood pressure.  # Sepsis likely source is decubitus ulcers On IV abx. WBC  trending down Afebrile now Bcx - NGTD. Ordered MRI spine but patient refused as she is unable to lay flat with pain from ulcer. Appreciated ID consult.  sent culture from the ulcer. Showed beta hemolytic strep.  # Microcytic iron deficiency anemia Blood in stool. Transfuse 1 unit PRBC for worsening symptomatic anemia. Monitor Hb- stable now. Consulted GI.   EGD done- no clear source- She had negatvie Colonoscopy in last 4 years.  monitor Hb.  # History of breast cancer Follow-up with oncology as outpatient.  # Severe dehydration due to decreased intake from nausea Contributed to her hypotension and lethargic.  hydrated and alert now.  # Acute encephalopathy present on admission due to infection has resolved.  # DVT prophylaxis with heparin  # compression fracture L1- and causing her to be bed bound.   Chronic issue.   But as per CT - some worsening - called ortho consult.  # severe back pain   Likely due to infected Ulcer- added fentanyl patch.   All the records are reviewed and case discussed with Care Management/Social Workerr. Management plans discussed with the patient, family and they are in agreement.  CODE STATUS: FULL  DVT Prophylaxis: SCDs  TOTAL TIME TAKING CARE OF THIS PATIENT: 35 minutes.   POSSIBLE D/C IN 2-3 DAYS, DEPENDING ON CLINICAL CONDITION.   Vaughan Basta M.D on 09/14/2015 at 4:37 PM  Between 7am to 6pm - Pager - [phone]  After 6pm go to [email or web address] - password [email or web address] [facility]  Office  [phone]  CC: Primary care physician; [name], MD    Note: This dictation was prepared with Dragon dictation along with smaller phrase technology. Any transcriptional errors that result from this process are unintentional.

## 2015-09-15 LAB — CBC
HCT: 30.6 % — ABNORMAL LOW (ref 35.0–47.0)
Hemoglobin: 10.4 g/dL — ABNORMAL LOW (ref 12.0–16.0)
MCH: 29.5 pg (ref 26.0–34.0)
MCHC: 33.9 g/dL (ref 32.0–36.0)
MCV: 87.2 fL (ref 80.0–100.0)
PLATELETS: 322 10*3/uL (ref 150–440)
RBC: 3.51 MIL/uL — ABNORMAL LOW (ref 3.80–5.20)
RDW: 19.3 % — AB (ref 11.5–14.5)
WBC: 8.6 10*3/uL (ref 3.6–11.0)

## 2015-09-15 LAB — WOUND CULTURE: Special Requests: NORMAL

## 2015-09-15 LAB — CREATININE, SERUM: Creatinine, Ser: 0.55 mg/dL (ref 0.44–1.00)

## 2015-09-15 MED ORDER — MORPHINE SULFATE ER 15 MG PO TBCR
15.0000 mg | EXTENDED_RELEASE_TABLET | Freq: Once | ORAL | Status: AC
  Administered 2015-09-15: 15 mg via ORAL
  Filled 2015-09-15: qty 1

## 2015-09-15 MED ORDER — DEXTROSE 5 % IV SOLN
1.0000 g | INTRAVENOUS | Status: DC
  Administered 2015-09-15: 1 g via INTRAVENOUS
  Filled 2015-09-15 (×3): qty 10

## 2015-09-15 MED ORDER — MORPHINE SULFATE ER 15 MG PO TBCR
30.0000 mg | EXTENDED_RELEASE_TABLET | Freq: Two times a day (BID) | ORAL | Status: DC
  Administered 2015-09-15 – 2015-09-16 (×2): 30 mg via ORAL
  Filled 2015-09-15 (×2): qty 2

## 2015-09-15 MED ORDER — GLUCERNA SHAKE PO LIQD
237.0000 mL | Freq: Two times a day (BID) | ORAL | Status: DC
  Administered 2015-09-16: 237 mL via ORAL

## 2015-09-15 MED ORDER — GLUCERNA 1.2 CAL PO LIQD: 1000.0000 mL | ORAL | Status: DC

## 2015-09-15 NOTE — Progress Notes (Signed)
[facility] NAME: [name]    MR#:  [id number]  DATE OF BIRTH:  1945/10/15  SUBJECTIVE:  CHIEF COMPLAINT:   Chief Complaint  Patient presents with  . Altered Mental Status    EMS pt from home it possible pt took too many pain meds last took 1 hour PTA     EGD done with out any clear findings of bleeding.   Still have pain. Hb stable, No fever, said her appetite is decreased for last few weeks.  ortho suggested MRI spine again- but she said- she could not lay flat even for a minute.   Pain is not well controlled as per her- last evening it was unbearable for 4 hours.   Requesting to increase pain meds.   REVIEW OF SYSTEMS:    Review of Systems  Constitutional: Positive for chills and malaise/fatigue. Negative for fever.  HENT: Negative for sore throat.   Eyes: Negative for blurred vision, double vision and pain.  Respiratory: Negative for cough, hemoptysis, shortness of breath and wheezing.   Cardiovascular: Negative for chest pain, palpitations, orthopnea and leg swelling.  Gastrointestinal: Negative for heartburn, nausea, vomiting, abdominal pain, diarrhea and constipation.  Genitourinary: Negative for dysuria and hematuria.  Musculoskeletal: Positive for back pain. Negative for joint pain.  Skin: Negative for rash.  Neurological: Positive for weakness. Negative for sensory change, speech change, focal weakness and headaches.  Endo/Heme/Allergies: Does not bruise/bleed easily.  Psychiatric/Behavioral: Negative for depression. The patient is not nervous/anxious.     DRUG ALLERGIES:   Allergies  Allergen Reactions  . Boniva [Ibandronic Acid]     Pain neuropathy worse  . Codeine Other (See Comments)    Nervous/hyper  . Erythromycin Other (See Comments)    "Liver damage"  . Levaquin [Levofloxacin In D5w] Other (See Comments)    Hallucinations     VITALS:  Blood pressure 119/67, pulse 82, temperature 98.5  F (36.9 C), temperature source Oral, resp. rate 16, height 5\' 4"  (1.626 m), weight 42.638 kg (94 lb), SpO2 99 %.  PHYSICAL EXAMINATION:   Physical Exam  GENERAL:  70 y.o.-year-old patient lying in the bed with no acute distress.  EYES: Pupils equal, round, reactive to light and accommodation. No scleral icterus. Extraocular muscles intact.  HEENT: Head atraumatic, normocephalic. Oropharynx and nasopharynx clear.  NECK:  Supple, no jugular venous distention. No thyroid enlargement, no tenderness.  LUNGS: Normal breath sounds bilaterally, no wheezing, rales, rhonchi. No use of accessory muscles of respiration.  CARDIOVASCULAR: S1, S2 normal. No murmurs, rubs, or gallops.  ABDOMEN: Soft, nontender, nondistended. Bowel sounds present. No organomegaly or mass.  EXTREMITIES: No cyanosis, clubbing or edema b/l.    NEUROLOGIC: Cranial nerves II through XII are intact. B/l lower limbs weakness.  PSYCHIATRIC: The patient is alert and oriented x 3.  SKIN: Decubitus ulcers on upper back, sacrum, bilateral hips.   LABORATORY PANEL:   CBC  Recent Labs Lab 09/15/15 0541  WBC 8.6  HGB 10.4*  HCT 30.6*  PLT 322   ------------------------------------------------------------------------------------------------------------------  Chemistries   Recent Labs Lab 09/11/15 0510 09/12/15 0523 09/15/15 0541  NA 137 136  --   K 3.0* 3.8  --   CL 112* 112*  --   CO2 20* 20*  --   GLUCOSE 95 102*  --   BUN 7 <5*  --   CREATININE 0.46 0.51 0.55  CALCIUM 8.7* 8.9  --   MG 1.4* 1.6*  --  AST 56*  --   --   ALT 26  --   --   ALKPHOS 185*  --   --   BILITOT 1.1  --   --    ------------------------------------------------------------------------------------------------------------------  Cardiac Enzymes  Recent Labs Lab 09/09/15 1328  TROPONINI 0.05*   ------------------------------------------------------------------------------------------------------------------  RADIOLOGY:  No  results found.   ASSESSMENT AND PLAN:   70 year old female patient with history of diabetes mellitus, breast cancer, neuropathy,GERD, hypothyroidism presented to the emergency room with lethargy, confusion and low blood pressure.  # Sepsis likely source is decubitus ulcers- may have infected vertebra- but can not rule out with out MRI spine. On IV abx. Vanc+ zosyn- Now changed to rocephin ( 09/15/15) depending wound Cx report. WBC  trending down Afebrile now Bcx - NGTD. Ordered MRI spine but patient refused as she is unable to lay flat with pain from ulcer. Appreciated ID consult.  appreciated Ortho consult.  # Microcytic iron deficiency anemia Blood in stool. Transfuse 1 unit PRBC for worsening symptomatic anemia. Monitor Hb- stable now. Consulted GI.   EGD done- no clear source- She had negatvie Colonoscopy in last 4 years.  # History of breast cancer Follow-up with oncology as outpatient.  # Severe dehydration due to decreased intake from nausea on presentation Contributed to her hypotension and lethargic.  hydrated and alert now.  # Acute encephalopathy present on admission due to infection has resolved.  # DVT prophylaxis with heparin  # compression fracture L1- and causing her to be bed bound.   Chronic issue.   But as per CT - some worsening - called ortho consult.   He suggested MRI spine- which may not be possible due to severe pain    Encouraged pt to follow with spine surgeon in [place] as she was referred to him in past.   She will follow once her wound is little better and she is able to sit again.  # severe back pain   Likely due to infected Ulcer- added fentanyl patch. Increased MS contin.   On oral oxycodon as needed bases.  # Generalized weakness   She refused to work with PT multiple times, but confident that she will be walking in her home with her walker.    She have her husband and a full time care taker at home.  All the records are reviewed and  case discussed with Care Management/Social Workerr. Management plans discussed with the patient, family and they are in agreement.  CODE STATUS: FULL  DVT Prophylaxis: SCDs  TOTAL TIME TAKING CARE OF THIS PATIENT: 35 minutes.   POSSIBLE D/C IN 1-2 DAYS, DEPENDING ON CLINICAL CONDITION. Husband in room- explained him the plan.  Vaughan Basta M.D on 09/15/2015 at 4:25 PM  Between 7am to 6pm - Pager - [phone]  After 6pm go to [email or web address] - password [email or web address] [facility]  Office  [phone]  CC: Primary care physician; [name], MD    Note: This dictation was prepared with Dragon dictation along with smaller phrase technology. Any transcriptional errors that result from this process are unintentional.

## 2015-09-15 NOTE — Progress Notes (Signed)
Nutrition Follow-up     INTERVENTION:  Meals and snacks: Cater to pt preferences Medical Nutrition Supplement Therapy: Pt concerned about amount of carbohydrate in ensure, will switch to glucerna BID for added nutrition. Discussed glucerna does not have as much protein and kcals as ensure and really needs that extra nutrition if not eating well. Pt verbalized understanding and expect good compliance   NUTRITION DIAGNOSIS:   Increased nutrient needs related to wound healing, poor appetite, acute illness as evidenced by estimated needs.    GOAL:   Patient will meet greater than or equal to 90% of their needs    MONITOR:    (Energy Intake, Anthropometrics, Digestive System, Electrolyte/Renal Profile)  REASON FOR ASSESSMENT:   Malnutrition Screening Tool    ASSESSMENT:      Current Nutrition: ate 100% of meatloaf, corn and eating carrot sticks for lunch today.  Husband bringing in piece of carrot cake for pt to eat as well    Scheduled Medications:  . ALPRAZolam  0.25 mg Oral Once  . cefTRIAXone (ROCEPHIN)  IV  1 g Intravenous Q24H  . feeding supplement (GLUCERNA SHAKE)  237 mL Oral BID BM  . fentaNYL  25 mcg Transdermal Q72H  . gabapentin  400 mg Oral TID  . magnesium oxide  400 mg Oral Daily  . megestrol  40 mg Oral Daily  . morphine  15 mg Oral Once  . morphine  30 mg Oral Q12H  . olopatadine  1 drop Both Eyes BID  . pantoprazole  40 mg Oral Daily  . polyethylene glycol  17 g Oral BID  . potassium chloride  40 mEq Oral BID  . sodium chloride  10-40 mL Intracatheter Q12H       Electrolyte/Renal Profile and Glucose Profile:   Recent Labs Lab 09/10/15 0427 09/11/15 0510 09/12/15 0523 09/15/15 0541  NA 136 137 136  --   K 2.9* 3.0* 3.8  --   CL 110 112* 112*  --   CO2 20* 20* 20*  --   BUN 14 7 <5*  --   CREATININE 0.62 0.46 0.51 0.55  CALCIUM 8.7* 8.7* 8.9  --   MG  --  1.4* 1.6*  --   GLUCOSE 94 95 102*  --    Protein Profile:   Recent  Labs Lab 09/09/15 0736 09/11/15 0510  ALBUMIN 3.5 2.9*    Gastrointestinal Profile: Last BM: 1/02    Diet Order:  Diet regular Room service appropriate?: Yes; Fluid consistency:: Thin  Skin:   multiple pressure ulcers noted  Height:   Ht Readings from Last 1 Encounters:  09/12/15 5\' 4"  (1.626 m)    Weight:   Wt Readings from Last 1 Encounters:  09/12/15 94 lb (42.638 kg)    Ideal Body Weight:     BMI:  Body mass index is 16.13 kg/(m^2).  Estimated Nutritional Needs:   Kcal:  1355-1581 kcals (BEE 941, 1.2 AF, 1.2-1.4 IF)   Protein:  65-77 g (1.5-1.8 g/kg)   Fluid:  1290-1505 mL (30-35 ml/kg)   EDUCATION NEEDS:   Education needs addressed  LOW Care Level  Edit Ricciardelli B. Zenia Resides, Agency, [place] (pager) Weekend/On-Call pager [phone])

## 2015-09-15 NOTE — Care Management (Addendum)
Patient's hgb has stabilized.  Attending made change on oral pain medication regime this day for breakthrough pain.  She is on  Fentanyl patch, MS Contin bid and Xanax.   Patient is not receiving any IV meds for pain.  Home health nurse can continue to monitor patient's pain control at home.  Anticipate patient will discharge home 09/16/2015.  Patient has declined evaluation by physical therapy.

## 2015-09-15 NOTE — Progress Notes (Signed)
PT Cancellation Note  Patient Details Name: SHIVAUN AVRIL MRN: [id number] DOB: 11-Mar-1946   Cancelled Treatment:    Reason Eval/Treat Not Completed: Other (comment). Evaluation attempted for 3rd time with pt continuing to refuse therapy services. MD/RN aware. She wishes to be taken off caseload. Will dc current orders.   Annelies Coyt 09/15/2015, 1:56 PM  Greggory Stallion, PT, DPT [phone]

## 2015-09-15 NOTE — Care Management Important Message (Signed)
Important Message  Patient Details  Name: [name] MRN: [id number] Date of Birth: Oct 01, 1945   Medicare Important Message Given:  Yes    Juliann Pulse A Hancel Ion 09/15/2015, 10:02 AM

## 2015-09-15 NOTE — Consult Note (Signed)
Pt denies any GI complaints at this time, no abd pain, no dysphagia, nausea, vomiting or diarrhea. Took Miralax for bowels Eating better. Anemia, may be of chronic disease given her bed sores.  No further GI imput needed at this time.  I will sign off, reconsult if needed.

## 2015-09-16 LAB — CREATININE, SERUM
CREATININE: 0.32 mg/dL — AB (ref 0.44–1.00)
GFR calc Af Amer: >60 mL/min (ref >60)

## 2015-09-16 LAB — SURGICAL PATHOLOGY

## 2015-09-16 LAB — POTASSIUM
POTASSIUM: 2.9 mmol/L — AB (ref 3.5–5.1)
Potassium: 4.4 mmol/L (ref 3.5–5.1)

## 2015-09-16 LAB — SEDIMENTATION RATE: SED RATE: 28 mm/h (ref 0–30)

## 2015-09-16 MED ORDER — MEGESTROL ACETATE 40 MG PO TABS: 40.0000 mg | ORAL_TABLET | Freq: Every day | ORAL | Status: AC

## 2015-09-16 MED ORDER — AMOXICILLIN-POT CLAVULANATE 875-125 MG PO TABS: 1.0000 | ORAL_TABLET | Freq: Two times a day (BID) | ORAL | Status: DC

## 2015-09-16 MED ORDER — GLUCERNA SHAKE PO LIQD: 237.0000 mL | Freq: Two times a day (BID) | ORAL | Status: AC

## 2015-09-16 MED ORDER — MAGNESIUM OXIDE 400 (241.3 MG) MG PO TABS: 400.0000 mg | ORAL_TABLET | Freq: Every day | ORAL | Status: DC

## 2015-09-16 MED ORDER — FENTANYL 25 MCG/HR TD PT72: 25.0000 ug | MEDICATED_PATCH | TRANSDERMAL | Status: DC

## 2015-09-16 MED ORDER — CEPHALEXIN 500 MG PO CAPS: 500.0000 mg | ORAL_CAPSULE | Freq: Four times a day (QID) | ORAL | Status: DC

## 2015-09-16 MED ORDER — MAGNESIUM SULFATE 2 GM/50ML IV SOLN
2.0000 g | Freq: Once | INTRAVENOUS | Status: AC
  Administered 2015-09-16: 2 g via INTRAVENOUS
  Filled 2015-09-16: qty 50

## 2015-09-16 MED ORDER — MORPHINE SULFATE ER 30 MG PO TBCR: 30.0000 mg | EXTENDED_RELEASE_TABLET | Freq: Two times a day (BID) | ORAL | Status: AC

## 2015-09-16 MED ORDER — ONDANSETRON HCL 4 MG PO TABS: 4.0000 mg | ORAL_TABLET | Freq: Three times a day (TID) | ORAL | Status: AC | PRN

## 2015-09-16 MED ORDER — CEPHALEXIN 500 MG PO CAPS: 500.0000 mg | ORAL_CAPSULE | Freq: Three times a day (TID) | ORAL | Status: DC

## 2015-09-16 MED ORDER — POLYETHYLENE GLYCOL 3350 17 G PO PACK: 17.0000 g | PACK | Freq: Two times a day (BID) | ORAL | Status: AC

## 2015-09-16 MED ORDER — SENNOSIDES-DOCUSATE SODIUM 8.6-50 MG PO TABS: 1.0000 | ORAL_TABLET | Freq: Every evening | ORAL | Status: AC | PRN

## 2015-09-16 MED ORDER — POTASSIUM CHLORIDE ER 20 MEQ PO TBCR: 10.0000 meq | EXTENDED_RELEASE_TABLET | Freq: Two times a day (BID) | ORAL | Status: AC

## 2015-09-16 MED ORDER — POTASSIUM CHLORIDE IN NACL 40-0.9 MEQ/L-% IV SOLN
INTRAVENOUS | Status: AC
  Administered 2015-09-16: 125 mL/h via INTRAVENOUS
  Filled 2015-09-16: qty 1000

## 2015-09-16 MED ORDER — OXYCODONE HCL 5 MG PO TABS: 5.0000 mg | ORAL_TABLET | ORAL | Status: AC | PRN

## 2015-09-16 MED ORDER — MAGNESIUM OXIDE 400 (241.3 MG) MG PO TABS: 400.0000 mg | ORAL_TABLET | Freq: Two times a day (BID) | ORAL | Status: AC

## 2015-09-16 NOTE — Progress Notes (Signed)
Attempted to transfer patient off of 2A since she no longer requires cardiac monitoring. RN explained this to patient, patient and husband got very anxious and strongly requested they not be transferred to another floor. Nursing supervisor Elmo Putt notified. Pt to stay on 2A. RN will continue to monitor. Rachael Fee, RN

## 2015-09-16 NOTE — Discharge Instructions (Signed)
Follow with Spinal surgery department at [facility]-( as advised by your orthopedic doctor in past- ) with in next 1 month.  Need to follow with PMD in 1-2 weeks to check Potassium level.

## 2015-09-16 NOTE — Discharge Summary (Addendum)
[facility] NAME: [name]    MR#:  [id number]  DATE OF BIRTH:  [birth year]  DATE OF ADMISSION:  09/09/2015 ADMITTING PHYSICIAN: Saundra Shelling, MD  DATE OF DISCHARGE: 09/16/2015  PRIMARY CARE PHYSICIAN: [name], MD    ADMISSION DIAGNOSIS:  lethargy IDA  DISCHARGE DIAGNOSIS:  Principal Problem:   Sepsis ([place]) Active Problems:   Decubitus ulcer, infected   SECONDARY DIAGNOSIS:   Past Medical History  Diagnosis Date  . Diabetes mellitus without complication ([place])   . Hypertension   . Neuropathy ([place])   . GERD (gastroesophageal reflux disease)   . Hypothyroidism   . Breast cancer ([place])     breast-left  . Bed sore on heel   . Bed sore on elbow     HOSPITAL COURSE:   # Sepsis likely source is decubitus ulcers- may have infected vertebra- but can not rule out with out MRI spine. On IV abx. Vanc+ zosyn- Now changed to rocephin ( 09/15/15) depending wound Cx report. WBC trending down Afebrile now Bcx - NGTD. Ordered MRI spine but patient refused as she is unable to lay flat with pain from ulcer. Appreciated ID consult. appreciated Ortho consult.  will discharge on oral augmentin for 2 weeks- and she need to follow with ortho clinic.  # Microcytic iron deficiency anemia Blood in stool. Transfuse 1 unit PRBC for worsening symptomatic anemia. Monitor Hb- stable now. Consulted GI.  EGD done- no clear source- She had negatvie Colonoscopy in last 4 years.  # History of breast cancer Follow-up with oncology as outpatient.  # Severe dehydration due to decreased intake from nausea on presentation Contributed to her hypotension and lethargic. hydrated and alert now.  # Acute encephalopathy present on admission due to infection has resolved.  # DVT prophylaxis with heparin  # compression fracture L1- and causing her to be bed bound.  Chronic issue.  But as per CT - some worsening - called  ortho consult.  He suggested MRI spine- which may not be possible due to severe pain   Encouraged pt to follow with spine surgeon in [place] as she was referred to him in past.  She will follow once her wound is little better and she is able to sit again.  # severe back pain  Likely due to infected Ulcer- added fentanyl patch. Increased MS contin.  On oral oxycodon as needed bases.   Now pain is controlled with current regimen.  # Generalized weakness  She refused to work with PT multiple times, but confident that she will be walking in her home with her walker.  She have her husband and a full time care taker at home.   DISCHARGE CONDITIONS:   Fair.  CONSULTS OBTAINED:  Treatment Team:  Manya Silvas, MD Adrian Prows, MD Claud Kelp, MD  DRUG ALLERGIES:   Allergies  Allergen Reactions  . Boniva [Ibandronic Acid]     Pain neuropathy worse  . Codeine Other (See Comments)    Nervous/hyper  . Erythromycin Other (See Comments)    "Liver damage"  . Levaquin [Levofloxacin In D5w] Other (See Comments)    Hallucinations     DISCHARGE MEDICATIONS:   Current Discharge Medication List    START taking these medications   Details  cephALEXin (KEFLEX) 500 MG capsule Take 1 capsule (500 mg total) by mouth 3 (three) times daily. Qty: 42 capsule, Refills: 0    feeding supplement, GLUCERNA SHAKE, (  GLUCERNA SHAKE) LIQD Take 237 mLs by mouth 2 (two) times daily between meals. Qty: 20 Can, Refills: 0    fentaNYL (DURAGESIC - DOSED MCG/HR) 25 MCG/HR patch Place 1 patch (25 mcg total) onto the skin every 3 (three) days. Qty: 6 patch, Refills: 0    magnesium oxide (MAG-OX) 400 (241.3 Mg) MG tablet Take 1 tablet (400 mg total) by mouth 2 (two) times daily. Qty: 30 tablet, Refills: 0    megestrol (MEGACE) 40 MG tablet Take 1 tablet (40 mg total) by mouth daily. Qty: 15 tablet, Refills: 0    morphine (MS CONTIN) 30 MG 12 hr tablet Take 1 tablet (30 mg total) by  mouth every 12 (twelve) hours. Qty: 30 tablet, Refills: 0    ondansetron (ZOFRAN) 4 MG tablet Take 1 tablet (4 mg total) by mouth every 8 (eight) hours as needed for nausea. Qty: 30 tablet, Refills: 0    polyethylene glycol (MIRALAX / GLYCOLAX) packet Take 17 g by mouth 2 (two) times daily. Qty: 14 each, Refills: 0    potassium chloride 20 MEQ TBCR Take 10 mEq by mouth 2 (two) times daily. Qty: 50 tablet, Refills: 0    senna-docusate (SENOKOT-S) 8.6-50 MG tablet Take 1 tablet by mouth at bedtime as needed for mild constipation. Qty: 30 tablet, Refills: 0      CONTINUE these medications which have CHANGED   Details  oxyCODONE (OXY IR/ROXICODONE) 5 MG immediate release tablet Take 1 tablet (5 mg total) by mouth every 4 (four) hours as needed for breakthrough pain. Qty: 60 tablet, Refills: 0      CONTINUE these medications which have NOT CHANGED   Details  ALPRAZolam (XANAX) 0.25 MG tablet Take 0.25 mg by mouth 2 (two) times daily.    fexofenadine (ALLEGRA) 180 MG tablet Take 180 mg by mouth daily.    gabapentin (NEURONTIN) 400 MG capsule Take 400 mg by mouth 3 (three) times daily.    lansoprazole (PREVACID) 30 MG capsule Take 30 mg by mouth daily at 12 noon.    mometasone (NASONEX) 50 MCG/ACT nasal spray Place 2 sprays into the nose daily.    olopatadine (PATANOL) 0.1 % ophthalmic solution Place 1 drop into both eyes 2 (two) times daily.    zaleplon (SONATA) 10 MG capsule Take 10 mg by mouth at bedtime as needed for sleep.      STOP taking these medications     HYDROcodone-acetaminophen (NORCO/VICODIN) 5-325 MG per tablet      ibuprofen (ADVIL,MOTRIN) 200 MG tablet      metoprolol succinate (TOPROL-XL) 100 MG 24 hr tablet      nitrofurantoin (MACRODANTIN) 50 MG capsule          DISCHARGE INSTRUCTIONS:    Follow with PMD and ortho clinic in 2-3 weeks/  To Check Potassium level with PMD in office in 2 weeks.  If you experience worsening of your admission  symptoms, develop shortness of breath, life threatening emergency, suicidal or homicidal thoughts you must seek medical attention immediately by calling 911 or calling your MD immediately  if symptoms less severe.  You Must read complete instructions/literature along with all the possible adverse reactions/side effects for all the Medicines you take and that have been prescribed to you. Take any new Medicines after you have completely understood and accept all the possible adverse reactions/side effects.   Please note  You were cared for by a hospitalist during your hospital stay. If you have any questions about your discharge medications or  the care you received while you were in the hospital after you are discharged, you can call the unit and asked to speak with the hospitalist on call if the hospitalist that took care of you is not available. Once you are discharged, your primary care physician will handle any further medical issues. Please note that NO REFILLS for any discharge medications will be authorized once you are discharged, as it is imperative that you return to your primary care physician (or establish a relationship with a primary care physician if you do not have one) for your aftercare needs so that they can reassess your need for medications and monitor your lab values.    Today   CHIEF COMPLAINT:   Chief Complaint  Patient presents with  . Altered Mental Status    EMS pt from home it possible pt took too many pain meds last took 1 hour PTA     HISTORY OF PRESENT ILLNESS:  [name]  is a 70 y.o. female with a known history of diabetes mellitus diet controlled, hypertension, neuropathy, decubitus ulcers presented to the emergency room with lethargy and confusion. Patient was difficult to arouse and family called EMS and patient was brought to the emergency room. Upon arrival in the ER patient blood pressure was low systolic around 80 mmHg. Patient was resuscitated with IV  fluids and her blood pressure improved. Patient goes to wound care clinic for the last 8 weeks for the treatment of decubitus ulcers. Patient has multiple decubitus ulcers 1 in the lower back and one on both the hips. Patient has pain in the decubitus ulcers. No history of any fever. Generalized weakness present. No history of fall or head injury. Evaluation of the patient in the ER showed elevated WBC count and infected decubitus ulcers. Patient became more alert and responsive after IV fluids were given and patient was also given IV antibiotics in the emergency room. No history of chest pain. No history of any shortness of breath. No cough. No dysuria or hematuria.   VITAL SIGNS:  Blood pressure 151/72, pulse 99, temperature 98 F (36.7 C), temperature source Oral, resp. rate 20, height 5\' 4"  (1.626 m), weight 42.638 kg (94 lb), SpO2 100 %.  I/O:    Intake/Output Summary (Last 24 hours) at 09/16/15 1739 Last data filed at 09/16/15 1609  Gross per 24 hour  Intake    490 ml  Output    350 ml  Net    140 ml    PHYSICAL EXAMINATION:   GENERAL: 70 y.o.-year-old patient lying in the bed with no acute distress.  EYES: Pupils equal, round, reactive to light and accommodation. No scleral icterus. Extraocular muscles intact.  HEENT: Head atraumatic, normocephalic. Oropharynx and nasopharynx clear.  NECK: Supple, no jugular venous distention. No thyroid enlargement, no tenderness.  LUNGS: Normal breath sounds bilaterally, no wheezing, rales, rhonchi. No use of accessory muscles of respiration.  CARDIOVASCULAR: S1, S2 normal. No murmurs, rubs, or gallops.  ABDOMEN: Soft, nontender, nondistended. Bowel sounds present. No organomegaly or mass.  EXTREMITIES: No cyanosis, clubbing or edema b/l.  NEUROLOGIC: Cranial nerves II through XII are intact. B/l lower limbs weakness.  PSYCHIATRIC: The patient is alert and oriented x 3.  SKIN: Decubitus ulcers on upper back, sacrum, bilateral  hips.  DATA REVIEW:   CBC  Recent Labs Lab 09/15/15 0541  WBC 8.6  HGB 10.4*  HCT 30.6*  PLT 322    Chemistries   Recent Labs Lab 09/11/15 0510 09/12/15 0523  09/16/15 0514  NA 137 136  --   --   K 3.0* 3.8  --  2.9*  CL 112* 112*  --   --   CO2 20* 20*  --   --   GLUCOSE 95 102*  --   --   BUN 7 <5*  --   --   CREATININE 0.46 0.51  < > 0.32*  CALCIUM 8.7* 8.9  --   --   MG 1.4* 1.6*  --   --   AST 56*  --   --   --   ALT 26  --   --   --   ALKPHOS 185*  --   --   --   BILITOT 1.1  --   --   --   < > = values in this interval not displayed.  Cardiac Enzymes No results for input(s): TROPONINI in the last 168 hours.  Microbiology Results  Results for orders placed or performed during the hospital encounter of 09/09/15  Blood culture (routine x 2)     Status: None   Collection Time: 09/09/15  4:57 AM  Result Value Ref Range Status   Specimen Description BLOOD BLOOD LEFT FOREARM  Final   Special Requests BOTTLES DRAWN AEROBIC AND ANAEROBIC 5ML  Final   Culture NO GROWTH 5 DAYS  Final   Report Status 09/14/2015 FINAL  Final  Blood culture (routine x 2)     Status: None   Collection Time: 09/09/15  4:57 AM  Result Value Ref Range Status   Specimen Description BLOOD RIGHT ANTECUBITAL  Final   Special Requests BOTTLES DRAWN AEROBIC AND ANAEROBIC 5ML  Final   Culture NO GROWTH 5 DAYS  Final   Report Status 09/14/2015 FINAL  Final  Wound culture     Status: None   Collection Time: 09/12/15  2:49 PM  Result Value Ref Range Status   Specimen Description WOUND  Final   Special Requests Normal  Final   Gram Stain RARE WBC SEEN RARE GRAM POSITIVE RODS   Final   Culture   Final    MODERATE GROWTH STAPHYLOCOCCUS AUREUS WITH NORMAL SKIN FLORA    Report Status 09/15/2015 FINAL  Final   Organism ID, Bacteria STAPHYLOCOCCUS AUREUS  Final      Susceptibility   Staphylococcus aureus - MIC*    CIPROFLOXACIN <=0.5 SENSITIVE Sensitive     ERYTHROMYCIN 0.5 SENSITIVE  Sensitive     GENTAMICIN <=0.5 SENSITIVE Sensitive     OXACILLIN 0.5 SENSITIVE Sensitive     TRIMETH/SULFA <=10 SENSITIVE Sensitive     CLINDAMYCIN <=0.25 SENSITIVE Sensitive     CEFOXITIN SCREEN NEGATIVE Sensitive     Inducible Clindamycin NEGATIVE Sensitive     TETRACYCLINE Value in next row Sensitive      SENSITIVE<=1    * MODERATE GROWTH STAPHYLOCOCCUS AUREUS    RADIOLOGY:  No results found.  Management plans discussed with the patient, family and they are in agreement.  CODE STATUS:     Code Status Orders        Start     Ordered   09/09/15 0600  Full code   Continuous     09/09/15 0601      TOTAL TIME TAKING CARE OF THIS PATIENT: 35 minutes.    Vaughan Basta M.D on 09/16/2015 at 5:39 PM  Between 7am to 6pm - Pager - [phone]  After 6pm go to [email or web address] - password [facility]  Office  [phone]  CC: Primary care physician; [name], MD   Note: This dictation was prepared with Dragon dictation along with smaller phrase technology. Any transcriptional errors that result from this process are unintentional.

## 2015-09-16 NOTE — Progress Notes (Signed)
[name] INFECTIOUS DISEASE PROGRESS NOTE Date of Admission:  09/09/2015     ID: [name] is a 70 y.o. female with  Thoracic decub ulcer Principal Problem:   Sepsis ([place]) Active Problems:   Decubitus ulcer, infected   Subjective: She continues to have intense pain at the site of the thoracic wound but no pain at all in lumbar region. No fevers.  ROS  Eleven systems are reviewed and negative except per hpi  Medications:  Antibiotics Given (last 72 hours)    Date/Time Action Medication Dose Rate   09/13/15 1409 Given   piperacillin-tazobactam (ZOSYN) IVPB 3.375 g 3.375 g 12.5 mL/hr   09/13/15 2224 Given   piperacillin-tazobactam (ZOSYN) IVPB 3.375 g 3.375 g 12.5 mL/hr   09/14/15 0553 Given   piperacillin-tazobactam (ZOSYN) IVPB 3.375 g 3.375 g 12.5 mL/hr   09/14/15 0958 Given   vancomycin (VANCOCIN) IVPB 750 mg/150 ml premix 750 mg 150 mL/hr   09/14/15 1500 Given   piperacillin-tazobactam (ZOSYN) IVPB 3.375 g 3.375 g 12.5 mL/hr   09/14/15 2141 Given   vancomycin (VANCOCIN) IVPB 750 mg/150 ml premix 750 mg 150 mL/hr   09/14/15 2142 Given   piperacillin-tazobactam (ZOSYN) IVPB 3.375 g 3.375 g 12.5 mL/hr   09/15/15 0555 Given   piperacillin-tazobactam (ZOSYN) IVPB 3.375 g 3.375 g 12.5 mL/hr   09/15/15 0930 Given   vancomycin (VANCOCIN) IVPB 750 mg/150 ml premix 750 mg 150 mL/hr   09/15/15 1413 Given   cefTRIAXone (ROCEPHIN) 1 g in dextrose 5 % 50 mL IVPB 1 g 100 mL/hr     . ALPRAZolam  0.25 mg Oral Once  . cefTRIAXone (ROCEPHIN)  IV  1 g Intravenous Q24H  . feeding supplement (GLUCERNA SHAKE)  237 mL Oral BID BM  . fentaNYL  25 mcg Transdermal Q72H  . gabapentin  400 mg Oral TID  . magnesium oxide  400 mg Oral Daily  . megestrol  40 mg Oral Daily  . morphine  30 mg Oral Q12H  . olopatadine  1 drop Both Eyes BID  . pantoprazole  40 mg Oral Daily  . polyethylene glycol  17 g Oral BID  . potassium chloride  40 mEq Oral BID  . sodium chloride  10-40 mL  Intracatheter Q12H    Objective: Vital signs in last 24 hours: Temp:  [98 F (36.7 C)-98.5 F (36.9 C)] 98 F (36.7 C) (01/03 1117) Pulse Rate:  [84-104] 99 (01/03 1117) Resp:  [16-20] 20 (01/03 1117) BP: (100-151)/(54-84) 151/72 mmHg (01/03 1117) SpO2:  [99 %-100 %] 100 % (01/03 1117) Constitutional: oriented to person, place, and time. appears very thin,  HENT: [place]/AT, PERRLA, no scleral icterus Mouth/Throat: Oropharynx is clear and moist. No oropharyngeal exudate.  Cardiovascular: Normal rate, regular rhythm and normal heart sounds.  Pulmonary/Chest: Effort normal and breath sounds normal. No respiratory distress. has no wheezes.  Neck supple, no nuchal rigidity Abdominal: Soft. Bowel sounds are normal. exhibits no distension. There is no tenderness.  Lymphadenopathy: no cervical adenopathy. No axillary adenopathy Neurological: alert and oriented to person, place, and time.  Skin: Skin is warm and dry.R post back with a 2 cm stage 2 decub with some ttp and some drinage, R lateral thigh With stg 2 ucler as well Psychiatric: a normal mood and affect. behavior is normal.   Lab Results  Recent Labs  09/15/15 0541 09/16/15 0514  WBC 8.6  --   HGB 10.4*  --   HCT 30.6*  --   K  --  2.9*  CREATININE 0.55 0.32*    Microbiology: Results for orders placed or performed during the hospital encounter of 09/09/15  Blood culture (routine x 2)     Status: None   Collection Time: 09/09/15  4:57 AM  Result Value Ref Range Status   Specimen Description BLOOD BLOOD LEFT FOREARM  Final   Special Requests BOTTLES DRAWN AEROBIC AND ANAEROBIC 5ML  Final   Culture NO GROWTH 5 DAYS  Final   Report Status 09/14/2015 FINAL  Final  Blood culture (routine x 2)     Status: None   Collection Time: 09/09/15  4:57 AM  Result Value Ref Range Status   Specimen Description BLOOD RIGHT ANTECUBITAL  Final   Special Requests BOTTLES DRAWN AEROBIC AND ANAEROBIC 5ML  Final   Culture NO GROWTH 5  DAYS  Final   Report Status 09/14/2015 FINAL  Final  Wound culture     Status: None   Collection Time: 09/12/15  2:49 PM  Result Value Ref Range Status   Specimen Description WOUND  Final   Special Requests Normal  Final   Gram Stain RARE WBC SEEN RARE GRAM POSITIVE RODS   Final   Culture   Final    MODERATE GROWTH STAPHYLOCOCCUS AUREUS WITH NORMAL SKIN FLORA    Report Status 09/15/2015 FINAL  Final   Organism ID, Bacteria STAPHYLOCOCCUS AUREUS  Final      Susceptibility   Staphylococcus aureus - MIC*    CIPROFLOXACIN <=0.5 SENSITIVE Sensitive     ERYTHROMYCIN 0.5 SENSITIVE Sensitive     GENTAMICIN <=0.5 SENSITIVE Sensitive     OXACILLIN 0.5 SENSITIVE Sensitive     TRIMETH/SULFA <=10 SENSITIVE Sensitive     CLINDAMYCIN <=0.25 SENSITIVE Sensitive     CEFOXITIN SCREEN NEGATIVE Sensitive     Inducible Clindamycin NEGATIVE Sensitive     TETRACYCLINE Value in next row Sensitive      SENSITIVE<=1    * MODERATE GROWTH STAPHYLOCOCCUS AUREUS    Studies/Results: Ct Head Wo Contrast  09/09/2015  CLINICAL DATA:  Chronic weight loss, status post shoulder surgery, with ulcerations about the body. Loss of appetite. Initial encounter. EXAM: CT HEAD WITHOUT CONTRAST TECHNIQUE: Contiguous axial images were obtained from the base of the skull through the vertex without intravenous contrast. COMPARISON:  MRI of the brain performed 11/27/2014 FINDINGS: There is no evidence of acute infarction, mass lesion, or intra- or extra-axial hemorrhage on CT. Prominence of the ventricles and sulci reflects moderate cortical volume loss. Cerebellar atrophy is noted. Scattered periventricular and subcortical white matter change likely reflects small vessel ischemic microangiopathy. The brainstem and fourth ventricle are within normal limits. The basal ganglia are unremarkable in appearance. The cerebral hemispheres demonstrate grossly normal gray-white differentiation. No mass effect or midline shift is seen. There  is no evidence of fracture; visualized osseous structures are unremarkable in appearance. The visualized portions of the orbits are within normal limits. The paranasal sinuses and mastoid air cells are well-aerated. No significant soft tissue abnormalities are seen. IMPRESSION: 1. No acute intracranial pathology seen on CT. 2. Moderate cortical volume loss and scattered small vessel ischemic microangiopathy. Electronically Signed   By: Garald Balding M.D.   On: 09/09/2015 02:43   Ct Thoracic Spine W Contrast  09/11/2015  CLINICAL DATA:  Probable sepsis from decubitus ulcers.  Back pain. EXAM: CT THORACIC SPINE WITH CONTRAST; CT LUMBAR SPINE WITH CONTRAST TECHNIQUE: Multidetector CT imaging of the thoracic spine was performed during intravenous contrast administration. Multiplanar CT image reconstructions were  also generated; Multidetector CT imaging of the lumbar spine was performed with intravenous contrast administration. Multiplanar CT image reconstructions were also generated. CONTRAST:  29m OMNIPAQUE IOHEXOL 300 MG/ML  SOLN COMPARISON:  MRI lumbar spine 12/05/2014. FINDINGS: THORACIC: No thoracic compression fracture or subluxation. No intraspinal mass lesion is evident. No calcified intervertebral disc herniations of significance. No features suggestive of thoracic discitis or osteomyelitis. RIGHT lower lobe pneumonia with effusion. PICC line. No proximal rib fractures. LUMBAR: Severe L1 compression deformity. This has progressed with regard to loss of height from the prior MR. RIGHT L1 transverse process fracture also seen. Comminuted fracture with a prominent vertical cleft extending from anterior to posterior. Up to 8 mm retropulsion of bone into the canal resulting in severe LEFT greater than RIGHT stenosis. No facet capsule disruption is established, but ligamentous edema on prior MR suggests the possibility of dynamic instability with patient upright. Skeletal osteopenia, but no other compression  deformities are seen. No features suggestive of lumbar discitis or osteomyelitis. Edema is noted in the soft tissues overlying the lower sacrum and LEFT buttock, but sacral osteomyelitis is not established. Mild disc space narrowing L1-L2. The other intervertebral disc spaces are fairly well preserved. There is a RIGHT paracentral protrusion at L5-S1. Multifactorial subarticular zone narrowing at L4-5 on the RIGHT related to disc disease in facet arthropathy. Mild lower lumbar facet arthropathy without pars defects is noted. Moderate stool burden. Bladder distention. Progressive pancreatic duct dilatation. This is followed to or just proximal to the ampulla, without dominant obstructive mass. Prominent dorsal duct entering the duodenum, possibly representing pancreas divisum or a variant. Common duct felt to be upper normal but suboptimally evaluated. Findings are favored to represent the sequelae of pancreatitis, possibly related to pancreas divisum. Cholecystectomy. Aortic atherosclerosis. IMPRESSION: Severe L1 compression deformity with retropulsion and near complete loss of height anteriorly. There is progression of anterior wedging, with 8 mm of retropulsed bone fragment resulting in severe spinal stenosis. Facet joints remain anatomically aligned, but ligamentous edema between the L1 and L2 spinous processes was observed on prior MR, suggesting the potential for dynamic instability with patient upright. No  features of thoracic or lumbar discitis or osteomyelitis. Edema of the soft tissues overlying the sacrum and LEFT buttock, but sacral osteomyelitis not established. Progressive pancreatic duct dilatation. See discussion above. Electronically Signed   By: JStaci RighterM.D.   On: 09/11/2015 15:28   Ct Lumbar Spine W Contrast  09/11/2015  CLINICAL DATA:  Probable sepsis from decubitus ulcers.  Back pain. EXAM: CT THORACIC SPINE WITH CONTRAST; CT LUMBAR SPINE WITH CONTRAST TECHNIQUE: Multidetector CT  imaging of the thoracic spine was performed during intravenous contrast administration. Multiplanar CT image reconstructions were also generated; Multidetector CT imaging of the lumbar spine was performed with intravenous contrast administration. Multiplanar CT image reconstructions were also generated. CONTRAST:  856mOMNIPAQUE IOHEXOL 300 MG/ML  SOLN COMPARISON:  MRI lumbar spine 12/05/2014. FINDINGS: THORACIC: No thoracic compression fracture or subluxation. No intraspinal mass lesion is evident. No calcified intervertebral disc herniations of significance. No features suggestive of thoracic discitis or osteomyelitis. RIGHT lower lobe pneumonia with effusion. PICC line. No proximal rib fractures. LUMBAR: Severe L1 compression deformity. This has progressed with regard to loss of height from the prior MR. RIGHT L1 transverse process fracture also seen. Comminuted fracture with a prominent vertical cleft extending from anterior to posterior. Up to 8 mm retropulsion of bone into the canal resulting in severe LEFT greater than RIGHT stenosis. No facet capsule disruption  is established, but ligamentous edema on prior MR suggests the possibility of dynamic instability with patient upright. Skeletal osteopenia, but no other compression deformities are seen. No features suggestive of lumbar discitis or osteomyelitis. Edema is noted in the soft tissues overlying the lower sacrum and LEFT buttock, but sacral osteomyelitis is not established. Mild disc space narrowing L1-L2. The other intervertebral disc spaces are fairly well preserved. There is a RIGHT paracentral protrusion at L5-S1. Multifactorial subarticular zone narrowing at L4-5 on the RIGHT related to disc disease in facet arthropathy. Mild lower lumbar facet arthropathy without pars defects is noted. Moderate stool burden. Bladder distention. Progressive pancreatic duct dilatation. This is followed to or just proximal to the ampulla, without dominant obstructive  mass. Prominent dorsal duct entering the duodenum, possibly representing pancreas divisum or a variant. Common duct felt to be upper normal but suboptimally evaluated. Findings are favored to represent the sequelae of pancreatitis, possibly related to pancreas divisum. Cholecystectomy. Aortic atherosclerosis. IMPRESSION: Severe L1 compression deformity with retropulsion and near complete loss of height anteriorly. There is progression of anterior wedging, with 8 mm of retropulsed bone fragment resulting in severe spinal stenosis. Facet joints remain anatomically aligned, but ligamentous edema between the L1 and L2 spinous processes was observed on prior MR, suggesting the potential for dynamic instability with patient upright. No  features of thoracic or lumbar discitis or osteomyelitis. Edema of the soft tissues overlying the sacrum and LEFT buttock, but sacral osteomyelitis not established. Progressive pancreatic duct dilatation. See discussion above. Electronically Signed   By: Staci Righter M.D.   On: 09/11/2015 15:28   Dg Chest Port 1 View  09/10/2015  CLINICAL DATA:  PICC placement.  Sepsis. EXAM: PORTABLE CHEST 1 VIEW COMPARISON:  Chest x-ray dated 09/08/2009 FINDINGS: Right PICC is been inserted. The tip is 8 cm below the carina at the cavoatrial junction. There is focal linear atelectasis at the right lung base. The lungs are otherwise clear. No effusions. Old left posterior rib fractures. IMPRESSION: PICC tip at the cavoatrial junction. Focal atelectasis at the right lung base. Electronically Signed   By: Lorriane Shire M.D.   On: 09/10/2015 16:21   US Abdomen Limited Ruq  09/12/2015  CLINICAL DATA:  Abnormal liver enzymes EXAM: US ABDOMEN LIMITED - RIGHT UPPER QUADRANT COMPARISON:  CT abdomen September 10, 2009 FINDINGS: Gallbladder: Surgically absent. Common bile duct: Diameter: 1 mm. There is no appreciable extrahepatic biliary duct dilatation. Intrahepatic ducts appear borderline prominent,  likely due to postcholecystectomy state. Liver: No focal lesion identified. Within normal limits in parenchymal echogenicity. Incidental note is made of a fairly small right pleural effusion. IMPRESSION: Gallbladder absent. Borderline prominence of the intrahepatic biliary ducts may be due to post cholecystectomy state. There is no common bile duct dilatation. No focal liver lesions are identified. Incidental note is made of a fairly small right pleural effusion. Electronically Signed   By: Lowella Grip III M.D.   On: 09/12/2015 09:02     Assessment/Plan: KENIA [name] is a 70 y.o. female admitted with AMS and found to have sepsis likely from back decub ulcers. She had shoulder surgery in August and was immobile leading to the back wound, on upper back. Has been following with wound center. WBC has improved to 8. Doyline neg. CT spine neg for deep infection but she cannot tolerate laying flat for MRI. I do think likley skin source for her sepsis as no other obvious infection.  There is some concern for Lumbar osteo/discitis based  on progressive collapse of L1 but she has no tenderness there.  I do not think she has osteo/discitis clinically. Cx from wound grew MSSA  Recommendations Would rec a 2 week course of oral keflex 500 TID at this point  This will target the wound and may provide some coverage of possible spinal infection (which I do not think is present) I can see in 2 weeks and if able to tolerate will order MRI Check ESR, crp today Thank you very much for the consult. Will follow with you.  [facility] [place], [place]   09/16/2015, 1:52 PM

## 2015-09-16 NOTE — Progress Notes (Signed)
Upon trying to discharge the patient per MD order, patient cried and stated over and over that this wasn't right to discharge her this late. The patient felt she was not put first and was very upset that her day "was so chaotic". Attempted to explain to the patient that we were just waiting for her potassium level to come back normal, and patient could not understand that was the only issue. Attempted to talk to prime MD on call to see if there was anything we could do to keep her overnight until the morning. Dr. Posey Pronto stated that if Dr. Anselm Jungling cleared her medically there was nothing we can do and she will have to go home. Attempted over and over to console the patient, and she stated "it's not your fault, what's done is already done." PICC line was then removed and discharge reviewed with husband and patient. Prescriptions reviewed and education. Patient had then stopped crying and seemed okay with going home. Wheeled patient out myself and got her in the car.

## 2015-09-16 NOTE — Progress Notes (Addendum)
Discharge is completely ready. MD has been in touch with this RN multiple times for discharge plan. First potassium drawn was contaminated through PICC line. Drawn as a regular stick second time and has resulted at 4.4. Patient is now ready for discharge. Husband currently left to go drop off prescriptions before their pharmacy closes this evening. He will come right back to take her home. Education given on hospital problems and patient has no further questions. Will follow up with PCP outpatient and has been instructed to follow up with a spinal surgeon. PICC line will be removed before leaving.

## 2015-09-16 NOTE — Progress Notes (Signed)
Called by lab for a critical potassium level of 2.9. Dr. Anselm Jungling on the floor the notified. MD is placing orders. Notified MD that only one lumen of patient's PICC line is working properly, so it may take a while to run potassium and magnesium. MD stated we will complete medications and recheck labs afterwards then send patient home and have her get her lab levels checked outpatient with her PCP. Will update patient and plan for discharge later today.

## 2015-09-16 NOTE — Care Management (Signed)
notified Arville Go of anticipated discharge for today and requested home visit within 24 hours of discharge.  Obtained order for resumption of home health

## 2015-09-17 ENCOUNTER — Encounter: Payer: Self-pay | Admitting: Unknown Physician Specialty

## 2015-09-17 LAB — C-REACTIVE PROTEIN: CRP: 0.8 mg/dL (ref <1.0)

## 2015-09-19 ENCOUNTER — Encounter: Payer: Medicare Other | Attending: Surgery | Admitting: Surgery

## 2015-09-19 DIAGNOSIS — F17200 Nicotine dependence, unspecified, uncomplicated: Secondary | ICD-10-CM | POA: Diagnosis not present

## 2015-09-19 DIAGNOSIS — L89213 Pressure ulcer of right hip, stage 3: Secondary | ICD-10-CM | POA: Insufficient documentation

## 2015-09-19 DIAGNOSIS — I10 Essential (primary) hypertension: Secondary | ICD-10-CM | POA: Insufficient documentation

## 2015-09-19 DIAGNOSIS — E1140 Type 2 diabetes mellitus with diabetic neuropathy, unspecified: Secondary | ICD-10-CM | POA: Diagnosis not present

## 2015-09-19 DIAGNOSIS — F1021 Alcohol dependence, in remission: Secondary | ICD-10-CM | POA: Insufficient documentation

## 2015-09-19 DIAGNOSIS — L89100 Pressure ulcer of unspecified part of back, unstageable: Secondary | ICD-10-CM | POA: Diagnosis not present

## 2015-09-19 DIAGNOSIS — G629 Polyneuropathy, unspecified: Secondary | ICD-10-CM | POA: Insufficient documentation

## 2015-09-19 DIAGNOSIS — L89613 Pressure ulcer of right heel, stage 3: Secondary | ICD-10-CM | POA: Diagnosis not present

## 2015-09-19 DIAGNOSIS — E1165 Type 2 diabetes mellitus with hyperglycemia: Secondary | ICD-10-CM | POA: Insufficient documentation

## 2015-09-19 DIAGNOSIS — Z853 Personal history of malignant neoplasm of breast: Secondary | ICD-10-CM | POA: Insufficient documentation

## 2015-09-19 DIAGNOSIS — M810 Age-related osteoporosis without current pathological fracture: Secondary | ICD-10-CM | POA: Insufficient documentation

## 2015-09-19 DIAGNOSIS — E11621 Type 2 diabetes mellitus with foot ulcer: Secondary | ICD-10-CM | POA: Insufficient documentation

## 2015-09-20 NOTE — Progress Notes (Signed)
[name], [name] ([id number]) Visit Report for 09/19/2015 Chief Complaint Document Details Patient Name: [name], [name] Date of Service: 09/19/2015 3:30 PM Medical Record Number: [id number] Patient Account Number: [id number] Date of Birth/Sex: [birth year] (69 y.o. Female) Treating RN: Primary Care Physician: Paulita Cradle Other Clinician: Referring Physician: Paulita Cradle Treating Physician/Extender: Frann Rider in Treatment: 27 Information Obtained from: Patient Chief Complaint Patient presents to the wound care center for a consult due non healing wound. Ulcers on the right elbow and the right heel for about 1 month. Electronic Signature(s) Signed: 09/19/2015 4:47:38 PM By: Christin Fudge MD, FACS Entered By: Christin Fudge on 09/19/2015 16:47:38 Ellena, Tiaja H. ([id number]) -------------------------------------------------------------------------------- HPI Details Patient Name: [name], [name] Date of Service: 09/19/2015 3:30 PM Medical Record Number: [id number] Patient Account Number: [id number] Date of Birth/Sex: 08/14/1946 (69 y.o. Female) Treating RN: Primary Care Physician: Paulita Cradle Other Clinician: Referring Physician: Paulita Cradle Treating Physician/Extender: Frann Rider in Treatment: 27 History of Present Illness Location: Ulceration on the right heel and the right elbow. Quality: Patient reports experiencing a dull pain to affected area(s). Severity: Patient states wound (s) are getting better. Duration: Patient has had the wound for < 4 weeks prior to presenting for treatment Timing: Pain in wound is Intermittent (comes and goes Context: The wound appeared gradually over time Modifying Factors: Consults to this date include:Augmentin and Bactrim and also some heel protection with duoderm Associated Signs and Symptoms: Patient reports having difficulty standing for long periods. HPI Description: 70 year old female with history of  peripheral neuropathy, history of diet controlled diabetes mellitus type 2, history of alcoholism here for wound consult sent by her PCP Dr. Sherilyn Cooter. She has pressure ulcers at her right elbow, bilateral heels. Plain films of right calcaneus without acute bony process. Patient started by PCP on Augmentin, Bactrim as per orders, DuoDerm dressings applied - reports some improvement in her ulcer since last seen. Denies fever, chills, nausea, vomiting, diarrhea. She had a right humerus fracture in the middle of May and has had no surgery and arm is in a sling. She is also been laying in the bed for quite a while. Past medical history significant for essential hypertension, osteoporosis, peripheral neuropathy, alcoholism, ataxia, personal history of breast cancer treated with surgery chemotherapy and radiation and this was done in December 2010. she is also status post laparoscopic cholecystectomy, pilonidal cyst excision, subcutaneous port placement, partial mastectomy on the left side, skin cancer removal. 03/21/2015 -- she says overall she's been doing better and continues to smoke about 15 cigarettes a day. 03/21/2015 - her orthopedic doctor has said she may require surgery for her right humerus fracture. 04/04/2015 -- her orthopedic surgery has been scheduled for August 11. 04/18/2015 -- she is doing fine as far as her elbow and her right heel goes but she has developed some redness over prominence on her thoracic spine and wanted me to take a look at this. 05/02/2015 -- she had her surgery done and now is in a sling and support. Her back has developed a pressure injury of undetermined stage. She seems to be in better spirits. 05/16/2015 -- last week her right heel was looking great and we had healed it out, but she has not been offloading appropriately and has a deep tissue injury on the right heel again. The area on her right elbow has opened out with slough and the area in the thoracic spine is  also getting worse. 05/30/2015 -- she has developed 2 new ulcerations one  on her left ischial tuberosity and one on the sacral region. She is still working on getting up smoking but is also unable to take her vitamins as she says she develops a diarrhea when she takes vitamins. She has increased her intake of proteins. 06/06/2015 -- the patient's husband manages to get her a low air loss mattress with initiating pressure but Bucklin, Elliette H. ([id number]) did not know how to use it exactly and the patient was not happy about using it. Overall she says she's been feeling better. 07/11/2015 -- . Discussed a surgical opinion for debridement and application of a wound VAC, 2 weeks ago but the patient has been reluctant to get a surgical opinion as she wants to avoid surgery. 07/18/2015 -- they have an appointment to see Dr. Tamala Julian this coming Wednesday. 07/29/2015 -- they saw Dr. Tamala Julian in his office and he did a debridement of the wound and removed significant amount of slough. This is in addition to the debridement I had done previously on Friday where a large amount of the eschar was removed. He did not recommend the application of wound VAC. Addendum : Dr. Thompson Caul note was reviewed via EPIC and details noted as above. 08/05/2015 -- over the last week she has developed a another pressure injury to her right hip and has had significant discoloration of the skin and an eschar there. 08/15/2015 -- she is still smoking about a pack of cigarettes a day and does not seem to want to quit. They have not been able to talk to the vendor regarding the air mattress and I will ask them to get in touch again. She is reluctant to take vitamins and does admit her nutrition is poor. 08/22/2015 -- she has been unable to tolerate her vitamins and continues to smoke. They're working on getting a low air loss mattress and have been speaking with the vendor. 09/19/2015 -- since her last visit and was admitted to the  hospital between 09/09/2015 and 09/16/2015. She was thought to have an active sepsis possibly from one of her decubitus ulcers but nothing was grown except for an MSSA from her thoracic spine region. CT of this area did not show any osteomyelitis. Been given IV antibiotics which included vancomycin and Zosyn in the hospital under the care of Dr. [name] the ID specialist she was sent home on oral Keflex 500 mg 3 times a day for 2 weeks. he will consider an MRI in the outpatient setting to completely rule out osteomyelitis of the spine. Electronic Signature(s) Signed: 09/19/2015 4:51:56 PM By: Christin Fudge MD, FACS Entered By: Christin Fudge on 09/19/2015 16:51:55 Rasheed, Lucilia H. ([id number]) -------------------------------------------------------------------------------- Physical Exam Details Patient Name: Corro, Bernese H. Date of Service: 09/19/2015 3:30 PM Medical Record Number: [id number] Patient Account Number: [id number] Date of Birth/Sex: 03-26-1946 (69 y.o. Female) Treating RN: Primary Care Physician: Paulita Cradle Other Clinician: Referring Physician: Paulita Cradle Treating Physician/Extender: Frann Rider in Treatment: 27 Constitutional . Pulse regular. Respirations normal and unlabored. Afebrile. . Eyes Nonicteric. Reactive to light. Ears, Nose, Mouth, and Throat Lips, teeth, and gums WNL.Marland Kitchen Moist mucosa without lesions. Neck supple and nontender. No palpable supraclavicular or cervical adenopathy. Normal sized without goiter. Respiratory WNL. No retractions.. Cardiovascular Pedal Pulses WNL. No clubbing, cyanosis or edema. Lymphatic No adneopathy. No adenopathy. No adenopathy. Musculoskeletal Adexa without tenderness or enlargement.. Digits and nails w/o clubbing, cyanosis, infection, petechiae, ischemia, or inflammatory conditions.. Integumentary (Hair, Skin) No suspicious lesions. No crepitus or fluctuance. No peri-wound warmth or  erythema. No  masses.Marland Kitchen Psychiatric Judgement and insight Intact.. No evidence of depression, anxiety, or agitation.. Notes thoracic spine looks excellent and is almost completely closed down and healed. The right hip is now definitely a stage III pressure ulcer and will need Santyl ointment locally. The rest of the pressure injuries stage I was stage II on the right medial ankle left lateral ankle and the left and right hip Electronic Signature(s) Signed: 09/19/2015 4:53:03 PM By: Christin Fudge MD, FACS Entered By: Christin Fudge on 09/19/2015 16:53:03 Mathies, Jennessa H. ([id number]) -------------------------------------------------------------------------------- Physician Orders Details Patient Name: Sorg, Laneya H. Date of Service: 09/19/2015 3:30 PM Medical Record Number: [id number] Patient Account Number: [id number] Date of Birth/Sex: 1946/05/09 (69 y.o. Female) Treating RN: Carolyne Fiscal, Debi Primary Care Physician: Paulita Cradle Other Clinician: Referring Physician: Paulita Cradle Treating Physician/Extender: Frann Rider in Treatment: 77 Verbal / Phone Orders: Yes Clinician: Carolyne Fiscal, Debi Read Back and Verified: Yes Diagnosis Coding Wound Cleansing Wound #10 Right,Medial Foot o Clean wound with Normal Saline. Wound #3 Midline Back o Clean wound with Normal Saline. Wound #7 Right Ischial Tuberosity o Clean wound with Normal Saline. Anesthetic Wound #7 Right Ischial Tuberosity o Topical Lidocaine 4% cream applied to wound bed prior to debridement Skin Barriers/Peri-Wound Care Wound #7 Right Ischial Tuberosity o Skin Prep Primary Wound Dressing Wound #7 Right Ischial Tuberosity o Santyl Ointment Secondary Dressing Wound #7 Right Ischial Tuberosity o Boardered Foam Dressing Wound #3 Midline Back o Boardered Foam Dressing Dressing Change Frequency Wound #7 Right Ischial Tuberosity o Change dressing every other day. Follow-up Appointments Wound #10  Right,Medial Foot Pereda, Yarixa H. ([id number]) o Return Appointment in 1 week. Wound #3 Midline Back o Return Appointment in 1 week. Wound #7 Right Ischial Tuberosity o Return Appointment in 1 week. Home Health Wound #10 [place] Visits o Home Health Nurse may visit PRN to address patientos wound care needs. o FACE TO FACE ENCOUNTER: MEDICARE and MEDICAID PATIENTS: I certify that this patient is under my care and that I had a face-to-face encounter that meets the physician face-to-face encounter requirements with this patient on this date. The encounter with the patient was in whole or in part for the following MEDICAL CONDITION: (primary reason for [place]) MEDICAL NECESSITY: I certify, that based on my findings, NURSING services are a medically necessary home health service. HOME BOUND STATUS: I certify that my clinical findings support that this patient is homebound (i.e., Due to illness or injury, pt requires aid of supportive devices such as crutches, cane, wheelchairs, walkers, the use of special transportation or the assistance of another person to leave their place of residence. There is a normal inability to leave the home and doing so requires considerable and taxing effort. Other absences are for medical reasons / religious services and are infrequent or of short duration when for other reasons). o If current dressing causes regression in wound condition, may D/C ordered dressing product/s and apply Normal Saline Moist Dressing daily until next [place] / Other MD appointment. [place] of regression in wound condition at [phone]. o Please direct any NON-WOUND related issues/requests for orders to patient's Primary Care Physician Wound #3 Midline Back o [place] Nurse may visit PRN to address patientos wound care needs. o FACE TO FACE ENCOUNTER:  MEDICARE and MEDICAID PATIENTS: I certify that this patient is under my care and that I had a face-to-face encounter that meets the physician  face-to-face encounter requirements with this patient on this date. The encounter with the patient was in whole or in part for the following MEDICAL CONDITION: (primary reason for [place]) MEDICAL NECESSITY: I certify, that based on my findings, NURSING services are a medically necessary home health service. HOME BOUND STATUS: I certify that my clinical findings support that this patient is homebound (i.e., Due to illness or injury, pt requires aid of supportive devices such as crutches, cane, wheelchairs, walkers, the use of special transportation or the assistance of another person to leave their place of residence. There is a normal inability to leave the home and doing so requires considerable and taxing effort. Other absences are for medical reasons / religious services and are infrequent or of short duration when for other reasons). o If current dressing causes regression in wound condition, may D/C ordered dressing product/s and apply Normal Saline Moist Dressing daily until next [place] / Other MD appointment. [place] of regression in wound condition at [phone]. o Please direct any NON-WOUND related issues/requests for orders to patient's Primary Care Physician [place], Katiejo HMarland Kitchen ([id number]) Wound #7 Right Ischial [place] Nurse may visit PRN to address patientos wound care needs. o FACE TO FACE ENCOUNTER: MEDICARE and MEDICAID PATIENTS: I certify that this patient is under my care and that I had a face-to-face encounter that meets the physician face-to-face encounter requirements with this patient on this date. The encounter with the patient was in whole or in part for the following MEDICAL CONDITION: (primary reason for [place]) MEDICAL  NECESSITY: I certify, that based on my findings, NURSING services are a medically necessary home health service. HOME BOUND STATUS: I certify that my clinical findings support that this patient is homebound (i.e., Due to illness or injury, pt requires aid of supportive devices such as crutches, cane, wheelchairs, walkers, the use of special transportation or the assistance of another person to leave their place of residence. There is a normal inability to leave the home and doing so requires considerable and taxing effort. Other absences are for medical reasons / religious services and are infrequent or of short duration when for other reasons). o If current dressing causes regression in wound condition, may D/C ordered dressing product/s and apply Normal Saline Moist Dressing daily until next [place] / Other MD appointment. [place] of regression in wound condition at [phone]. o Please direct any NON-WOUND related issues/requests for orders to patient's Primary Care Physician Electronic Signature(s) Signed: 09/19/2015 5:07:23 PM By: Christin Fudge MD, FACS Signed: 09/19/2015 5:37:28 PM By: Alric Quan Previous Signature: 09/19/2015 5:05:49 PM Version By: Christin Fudge MD, FACS Entered By: Alric Quan on 09/19/2015 17:06:56 Callow, Bessie H. ([id number]) -------------------------------------------------------------------------------- Problem List Details Patient Name: Pedley, Jiayi H. Date of Service: 09/19/2015 3:30 PM Medical Record Number: [id number] Patient Account Number: [id number] Date of Birth/Sex: February 27, 1946 (69 y.o. Female) Treating RN: Primary Care Physician: Carrie Mew, MIRIAM Other Clinician: Referring Physician: Paulita Cradle Treating Physician/Extender: Frann Rider in Treatment: 27 Active Problems ICD-10 Encounter Code Description Active Date Diagnosis E11.621 Type 2 diabetes mellitus with foot ulcer 03/13/2015  Yes L89.613 Pressure ulcer of right heel, stage 3 03/13/2015 Yes F17.218 Nicotine dependence, cigarettes, with other nicotine- 03/13/2015 Yes induced disorders L89.100 Pressure ulcer of unspecified part of back, unstageable 05/02/2015 Yes L89.213 Pressure ulcer of right hip, stage 3 08/05/2015 Yes Inactive Problems Resolved Problems ICD-10 Code Description Active Date Resolved  Date L89.013 Pressure ulcer of right elbow, stage 3 03/13/2015 03/13/2015 [id number] Pressure ulcer of left hip, stage 2 05/30/2015 05/30/2015 L89.153 Pressure ulcer of sacral region, stage 3 05/30/2015 05/30/2015 Electronic Signature(s) [name], Neko Lemmie Evens ([id number]) Signed: 09/19/2015 4:47:32 PM By: Christin Fudge MD, FACS Entered By: Christin Fudge on 09/19/2015 16:47:31 Regan, Phil H. ([id number]) -------------------------------------------------------------------------------- Progress Note Details Patient Name: Lovan, Marykathryn H. Date of Service: 09/19/2015 3:30 PM Medical Record Number: [id number] Patient Account Number: [id number] Date of Birth/Sex: [birth year] (69 y.o. Female) Treating RN: Primary Care Physician: Paulita Cradle Other Clinician: Referring Physician: Paulita Cradle Treating Physician/Extender: Frann Rider in Treatment: 27 Subjective Chief Complaint Information obtained from Patient Patient presents to the wound care center for a consult due non healing wound. Ulcers on the right elbow and the right heel for about 1 month. History of Present Illness (HPI) The following HPI elements were documented for the patient's wound: Location: Ulceration on the right heel and the right elbow. Quality: Patient reports experiencing a dull pain to affected area(s). Severity: Patient states wound (s) are getting better. Duration: Patient has had the wound for < 4 weeks prior to presenting for treatment Timing: Pain in wound is Intermittent (comes and goes Context: The wound appeared gradually over  time Modifying Factors: Consults to this date include:Augmentin and Bactrim and also some heel protection with duoderm Associated Signs and Symptoms: Patient reports having difficulty standing for long periods. 70 year old female with history of peripheral neuropathy, history of diet controlled diabetes mellitus type 2, history of alcoholism here for wound consult sent by her PCP Dr. Sherilyn Cooter. She has pressure ulcers at her right elbow, bilateral heels. Plain films of right calcaneus without acute bony process. Patient started by PCP on Augmentin, Bactrim as per orders, DuoDerm dressings applied - reports some improvement in her ulcer since last seen. Denies fever, chills, nausea, vomiting, diarrhea. She had a right humerus fracture in the middle of May and has had no surgery and arm is in a sling. She is also been laying in the bed for quite a while. Past medical history significant for essential hypertension, osteoporosis, peripheral neuropathy, alcoholism, ataxia, personal history of breast cancer treated with surgery chemotherapy and radiation and this was done in December 2010. she is also status post laparoscopic cholecystectomy, pilonidal cyst excision, subcutaneous port placement, partial mastectomy on the left side, skin cancer removal. 03/21/2015 -- she says overall she's been doing better and continues to smoke about 15 cigarettes a day. 03/21/2015 - her orthopedic doctor has said she may require surgery for her right humerus fracture. 04/04/2015 -- her orthopedic surgery has been scheduled for August 11. 04/18/2015 -- she is doing fine as far as her elbow and her right heel goes but she has developed some redness over prominence on her thoracic spine and wanted me to take a look at this. Bertagnolli, Chatara H. ([id number]) 05/02/2015 -- she had her surgery done and now is in a sling and support. Her back has developed a pressure injury of undetermined stage. She seems to be in better  spirits. 05/16/2015 -- last week her right heel was looking great and we had healed it out, but she has not been offloading appropriately and has a deep tissue injury on the right heel again. The area on her right elbow has opened out with slough and the area in the thoracic spine is also getting worse. 05/30/2015 -- she has developed 2 new ulcerations one on her left ischial tuberosity and one on the  sacral region. She is still working on getting up smoking but is also unable to take her vitamins as she says she develops a diarrhea when she takes vitamins. She has increased her intake of proteins. 06/06/2015 -- the patient's husband manages to get her a low air loss mattress with initiating pressure but did not know how to use it exactly and the patient was not happy about using it. Overall she says she's been feeling better. 07/11/2015 -- . Discussed a surgical opinion for debridement and application of a wound VAC, 2 weeks ago but the patient has been reluctant to get a surgical opinion as she wants to avoid surgery. 07/18/2015 -- they have an appointment to see Dr. Tamala Julian this coming Wednesday. 07/29/2015 -- they saw Dr. Tamala Julian in his office and he did a debridement of the wound and removed significant amount of slough. This is in addition to the debridement I had done previously on Friday where a large amount of the eschar was removed. He did not recommend the application of wound VAC. Addendum : Dr. Thompson Caul note was reviewed via EPIC and details noted as above. 08/05/2015 -- over the last week she has developed a another pressure injury to her right hip and has had significant discoloration of the skin and an eschar there. 08/15/2015 -- she is still smoking about a pack of cigarettes a day and does not seem to want to quit. They have not been able to talk to the vendor regarding the air mattress and I will ask them to get in touch again. She is reluctant to take vitamins and does admit her  nutrition is poor. 08/22/2015 -- she has been unable to tolerate her vitamins and continues to smoke. They're working on getting a low air loss mattress and have been speaking with the vendor. 09/19/2015 -- since her last visit and was admitted to the hospital between 09/09/2015 and 09/16/2015. She was thought to have an active sepsis possibly from one of her decubitus ulcers but nothing was grown except for an MSSA from her thoracic spine region. CT of this area did not show any osteomyelitis. Been given IV antibiotics which included vancomycin and Zosyn in the hospital under the care of Dr. [name] the ID specialist she was sent home on oral Keflex 500 mg 3 times a day for 2 weeks. he will consider an MRI in the outpatient setting to completely rule out osteomyelitis of the spine. Objective Constitutional Pulse regular. Respirations normal and unlabored. Afebrile. Vitals Time Taken: 4:00 PM, Height: 65 in, Weight: 118 lbs, BMI: 19.6, Temperature: 97.9 F, Pulse: 117 bpm, Respiratory Rate: 18 breaths/min, Blood Pressure: 148/78 mmHg. Eyes Nonicteric. Reactive to light. Vert, Airyanna H. ([id number]) Ears, Nose, Mouth, and Throat Lips, teeth, and gums WNL.Marland Kitchen Moist mucosa without lesions. Neck supple and nontender. No palpable supraclavicular or cervical adenopathy. Normal sized without goiter. Respiratory WNL. No retractions.. Cardiovascular Pedal Pulses WNL. No clubbing, cyanosis or edema. Lymphatic No adneopathy. No adenopathy. No adenopathy. Musculoskeletal Adexa without tenderness or enlargement.. Digits and nails w/o clubbing, cyanosis, infection, petechiae, ischemia, or inflammatory conditions.Marland Kitchen Psychiatric Judgement and insight Intact.. No evidence of depression, anxiety, or agitation.. General Notes: thoracic spine looks excellent and is almost completely closed down and healed. The right hip is now definitely a stage III pressure ulcer and will need Santyl ointment locally.  The rest of the pressure injuries stage I was stage II on the right medial ankle left lateral ankle and the left and right hip Integumentary (  Hair, Skin) No suspicious lesions. No crepitus or fluctuance. No peri-wound warmth or erythema. No masses.. Wound #10 status is Open. Original cause of wound was Gradually Appeared. The wound is located on the Right,Medial Foot. The wound measures 0.5cm length x 0.6cm width x 0.1cm depth; 0.236cm^2 area and 0.024cm^3 volume. The wound is limited to skin breakdown. There is no tunneling or undermining noted. There is a medium amount of serous drainage noted. The wound margin is flat and intact. There is no granulation within the wound bed. There is a large (67-100%) amount of necrotic tissue within the wound bed including Eschar and Adherent Slough. Wound #3 status is Open. Original cause of wound was Pressure Injury. The wound is located on the Midline Back. The wound measures 1.7cm length x 0.7cm width x 0.1cm depth; 0.935cm^2 area and 0.093cm^3 volume. The wound is limited to skin breakdown. There is no tunneling or undermining noted. There is a medium amount of serous drainage noted. The wound margin is distinct with the outline attached to the wound base. There is large (67-100%) red, pink granulation within the wound bed. There is a small (1-33%) amount of necrotic tissue within the wound bed including Adherent Slough. The periwound skin appearance exhibited: Localized Edema, Moist. The periwound skin appearance did not exhibit: Callus, Crepitus, Excoriation, Fluctuance, Friable, Induration, Rash, Scarring, Dry/Scaly, Maceration, Atrophie Blanche, Cyanosis, Ecchymosis, Hemosiderin Staining, Mottled, Pallor, Rubor, Erythema. Periwound temperature was noted as No Abnormality. The periwound has tenderness on palpation. Wound #7 status is Open. Original cause of wound was Pressure Injury. The wound is located on the Right Ischial Tuberosity. The wound  measures 0.7cm length x 0.8cm width x 0.1cm depth; 0.44cm^2 area and 0.044cm^3 volume. The wound is limited to skin breakdown. There is no tunneling or undermining noted. Shorts, Mirha H. ([id number]) There is a medium amount of serosanguineous drainage noted. The wound margin is distinct with the outline attached to the wound base. There is no granulation within the wound bed. There is a large (67- 100%) amount of necrotic tissue within the wound bed including Adherent Slough. The periwound skin appearance exhibited: Localized Edema, Moist. The periwound skin appearance did not exhibit: Callus, Crepitus, Excoriation, Fluctuance, Friable, Induration, Rash, Scarring, Dry/Scaly, Maceration, Atrophie Blanche, Cyanosis, Ecchymosis, Hemosiderin Staining, Mottled, Pallor, Rubor, Erythema. Periwound temperature was noted as No Abnormality. Wound #8 status is Open. Original cause of wound was Gradually Appeared. The wound is located on the Left Ischial Tuberosity. The wound measures 0.5cm length x 0.5cm width x 0.1cm depth; 0.196cm^2 area and 0.02cm^3 volume. The wound is limited to skin breakdown. There is no tunneling or undermining noted. There is a medium amount of serous drainage noted. The wound margin is flat and intact. There is no granulation within the wound bed. There is a large (67-100%) amount of necrotic tissue within the wound bed including Adherent Slough. The periwound skin appearance exhibited: Moist. Wound #9 status is Open. Original cause of wound was Gradually Appeared. The wound is located on the Left Malleolus. The wound measures 0.3cm length x 0.2cm width x 0.2cm depth; 0.047cm^2 area and 0.009cm^3 volume. The wound is limited to skin breakdown. There is no tunneling or undermining noted. There is a medium amount of serous drainage noted. The wound margin is flat and intact. There is large (67-100%) pink granulation within the wound bed. There is a small (1-33%) amount of necrotic  tissue within the wound bed including Adherent Slough. The periwound skin appearance exhibited: Moist. Periwound temperature was noted  as No Abnormality. The periwound has tenderness on palpation. 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 Wound #3 Midline Back: Clean wound with Normal Saline. Wound #7 Right Ischial Tuberosity: Ibach, Gwenivere H. ([id number]) Clean wound with Normal Saline. Anesthetic: Wound #7 Right Ischial Tuberosity: Topical Lidocaine 4% cream applied to wound bed prior to debridement Skin Barriers/Peri-Wound Care: Wound #7 Right Ischial Tuberosity: Skin Prep Primary Wound Dressing: Wound #7 Right Ischial Tuberosity: Santyl Ointment Secondary Dressing: Wound #7 Right Ischial Tuberosity: Boardered Foam Dressing Wound #3 Midline Back: Boardered Foam Dressing Dressing Change Frequency: Wound #7 Right Ischial Tuberosity: Change dressing every other day. Follow-up Appointments: Wound #10 Right,Medial Foot: Return Appointment in 1 week. Wound #3 Midline Back: Return Appointment in 1 week. Wound #7 Right Ischial Tuberosity: Return Appointment in 1 week. Home Health: Wound #10 Right,Medial Foot: [place] Nurse may visit PRN to address patient s wound care needs. FACE TO FACE ENCOUNTER: MEDICARE and MEDICAID PATIENTS: I certify that this patient is under my care and that I had a face-to-face encounter that meets the physician face-to-face encounter requirements with this patient on this date. The encounter with the patient was in whole or in part for the following  MEDICAL CONDITION: (primary reason for [place]) MEDICAL NECESSITY: I certify, that based on my findings, NURSING services are a medically necessary home health service. HOME BOUND STATUS: I certify that my clinical findings support that this patient is homebound (i.e., Due to illness or injury, pt requires aid of supportive devices such as crutches, cane, wheelchairs, walkers, the use of special transportation or the assistance of another person to leave their place of residence. There is a normal inability to leave the home and doing so requires considerable and taxing effort. Other absences are for medical reasons / religious services and are infrequent or of short duration when for other reasons). If current dressing causes regression in wound condition, may D/C ordered dressing product/s and apply Normal Saline Moist Dressing daily until next [place] / Other MD appointment. [place] of regression in wound condition at [phone]. Please direct any NON-WOUND related issues/requests for orders to patient's Primary Care Physician Wound #3 Midline Back: [place] Nurse may visit PRN to address patient s wound care needs. FACE TO FACE ENCOUNTER: MEDICARE and MEDICAID PATIENTS: I certify that this patient is under my care and that I had a face-to-face encounter that meets the physician face-to-face encounter requirements with this patient on this date. The encounter with the patient was in whole or in part for the following MEDICAL CONDITION: (primary reason for [place]) MEDICAL NECESSITY: I certify, that based on my findings, NURSING services are a medically necessary home health service. HOME Larsen, Whitleigh H. ([id number]) BOUND STATUS: I certify that my clinical findings support that this patient is homebound (i.e., Due to illness or injury, pt requires aid of supportive devices such as crutches, cane, wheelchairs,  walkers, the use of special transportation or the assistance of another person to leave their place of residence. There is a normal inability to leave the home and doing so requires considerable and taxing effort.  Other absences are for medical reasons / religious services and are infrequent or of short duration when for other reasons). If current dressing causes regression in wound condition, may D/C ordered dressing product/s and apply Normal Saline Moist Dressing daily until next [place] / Other MD appointment. [place] of regression in wound condition at [phone]. Please direct any NON-WOUND related issues/requests for orders to patient's Primary Care Physician Wound #7 Right Ischial Tuberosity: [place] Nurse may visit PRN to address patient s wound care needs. FACE TO FACE ENCOUNTER: MEDICARE and MEDICAID PATIENTS: I certify that this patient is under my care and that I had a face-to-face encounter that meets the physician face-to-face encounter requirements with this patient on this date. The encounter with the patient was in whole or in part for the following MEDICAL CONDITION: (primary reason for [place]) MEDICAL NECESSITY: I certify, that based on my findings, NURSING services are a medically necessary home health service. HOME BOUND STATUS: I certify that my clinical findings support that this patient is homebound (i.e., Due to illness or injury, pt requires aid of supportive devices such as crutches, cane, wheelchairs, walkers, the use of special transportation or the assistance of another person to leave their place of residence. There is a normal inability to leave the home and doing so requires considerable and taxing effort. Other absences are for medical reasons / religious services and are infrequent or of short duration when for other reasons). If current dressing causes regression in wound condition,  may D/C ordered dressing product/s and apply Normal Saline Moist Dressing daily until next [place] / Other MD appointment. [place] of regression in wound condition at [phone]. Please direct any NON-WOUND related issues/requests for orders to patient's Primary Care Physician I have reviewed all her recent notes from the hospital admission and I have recommended that she needs: 1. The air mattress and they have received this from the vendor. 2. Vitamin C, zinc and a multivitamin to be included in her diet in addition to high protein diet 3. Use a bordered foam on the thoracic spine wound. 4. Use Santyl ointment on the right hip 5. Continue to use heel protectors on both heels. 6. all of the pressure injury should be treated with silver alginate and bordered foam dressing. 7. Actively work on giving up smoking and we have discussed this at length including the various methodologies. I don't believe the patient is interested in giving up smoking. she will come back to see [place] next week. Electronic Signature(s) Signed: 09/19/2015 5:12:22 PM By: Christin Fudge MD, FACS Previous Signature: 09/19/2015 5:06:33 PM Version By: Christin Fudge MD, FACS Previous Signature: 09/19/2015 4:54:38 PM Version By: Christin Fudge MD, [name], Danijela H. ([id number]) Entered By: Christin Fudge on 09/19/2015 17:12:22 Sanks, Krystal H. ([id number]) -------------------------------------------------------------------------------- SuperBill Details Patient Name: Dowis, Tran H. Date of Service: 09/19/2015 Medical Record Number: [id number] Patient Account Number: [id number] Date of Birth/Sex: 07/16/1946 (69 y.o. Female) Treating RN: Primary Care Physician: Paulita Cradle Other Clinician: Referring Physician: Paulita Cradle Treating Physician/Extender: Frann Rider in Treatment: 27 Diagnosis Coding ICD-10 Codes Code Description E11.621 Type 2 diabetes mellitus with foot  ulcer L89.613 Pressure ulcer of right heel, stage 3 F17.218 Nicotine dependence, cigarettes, with other nicotine-induced disorders L89.100 Pressure ulcer of unspecified part of back, unstageable L89.213 Pressure ulcer of right hip, stage 3 Facility Procedures CPT4 Code: [id number] Description: [id number] - WOUND CARE VISIT-LEV 5 EST PT  Modifier: Quantity: 1 Physician Procedures CPT4 Code Description: [id number] - WC PHYS LEVEL 4 - EST PT ICD-10 Description Diagnosis 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

## 2015-09-20 NOTE — Progress Notes (Signed)
AMMANN, [name] Visit Report for 09/19/2015 Arrival Information Details Patient Name: [name], [name] Date of Service: 09/19/2015 3:30 PM Medical Record Number: [id number] Patient Account Number: [id number] Date of Birth/Sex: May 20, 1946 (69 y.o. Female) Treating RN: Cornell Barman Primary Care Physician: Paulita Cradle Other Clinician: Referring Physician: Paulita Cradle Treating Physician/Extender: Frann Rider in Treatment: 27 Visit Information History Since Last Visit Added or deleted any medications: No Patient Arrived: Wheel Chair Any new allergies or adverse reactions: No Arrival Time: 15:59 Had a fall or experienced change in No activities of daily living that may affect Accompanied By: husband risk of falls: Transfer Assistance: None Signs or symptoms of abuse/neglect since last No Patient Identification Verified: Yes visito Secondary Verification Process Yes Hospitalized since last visit: No Completed: Has Dressing in Place as Prescribed: Yes Patient Requires Transmission-Based No Pain Present Now: No Precautions: Patient Has Alerts: No Electronic Signature(s) Signed: 09/19/2015 5:51:05 PM By: Gretta Cool, RN, BSN, Kim RN, BSN Entered By: Gretta Cool, RN, BSN, Kim on 09/19/2015 15:59:39 [name], [name] -------------------------------------------------------------------------------- Clinic Level of Care Assessment Details Patient Name: [name] Date of Service: 09/19/2015 3:30 PM Medical Record Number: [id number] Patient Account Number: [id number] Date of Birth/Sex: 08/25/1946 (69 y.o. Female) Treating RN: Carolyne Fiscal, Debi Primary Care Physician: Paulita Cradle Other Clinician: Referring Physician: Paulita Cradle Treating Physician/Extender: Frann Rider in Treatment: 27 Clinic Level of Care Assessment Items TOOL 4 Quantity Score []  - Use when only an EandM is performed on FOLLOW-UP visit 0 ASSESSMENTS - Nursing Assessment /  Reassessment []  - Reassessment of Co-morbidities (includes updates in patient status) 0 X - Reassessment of Adherence to Treatment Plan 1 5 ASSESSMENTS - Wound and Skin Assessment / Reassessment []  - Simple Wound Assessment / Reassessment - one wound 0 X - Complex Wound Assessment / Reassessment - multiple wounds 4 5 []  - Dermatologic / Skin Assessment (not related to wound area) 0 ASSESSMENTS - Focused Assessment []  - Circumferential Edema Measurements - multi extremities 0 []  - Nutritional Assessment / Counseling / Intervention 0 []  - Lower Extremity Assessment (monofilament, tuning fork, pulses) 0 []  - Peripheral Arterial Disease Assessment (using hand held doppler) 0 ASSESSMENTS - Ostomy and/or Continence Assessment and Care []  - Incontinence Assessment and Management 0 []  - Ostomy Care Assessment and Management (repouching, etc.) 0 PROCESS - Coordination of Care []  - Simple Patient / Family Education for ongoing care 0 X - Complex (extensive) Patient / Family Education for ongoing care 1 20 X - Staff obtains Programmer, systems, Records, Test Results / Process Orders 1 10 X - Staff telephones HHA, Nursing Homes / Clarify orders / etc 1 10 []  - Routine Transfer to another Facility (non-emergent condition) 0 [name] []  - Routine Hospital Admission (non-emergent condition) 0 []  - New Admissions / Biomedical engineer / Ordering NPWT, Apligraf, etc. 0 []  - Emergency Hospital Admission (emergent condition) 0 X - Simple Discharge Coordination 1 10 []  - Complex (extensive) Discharge Coordination 0 PROCESS - Special Needs []  - Pediatric / Minor Patient Management 0 []  - Isolation Patient Management 0 []  - Hearing / Language / Visual special needs 0 []  - Assessment of Community assistance (transportation, D/C planning, etc.) 0 []  - Additional assistance / Altered mentation 0 []  - Support Surface(s) Assessment (bed, cushion, seat, etc.) 0 INTERVENTIONS - Wound Cleansing /  Measurement []  - Simple Wound Cleansing - one wound 0 X - Complex Wound Cleansing - multiple wounds 4 5 []  - Wound Imaging (photographs - any  number of wounds) 0 []  - Wound Tracing (instead of photographs) 0 []  - Simple Wound Measurement - one wound 0 X - Complex Wound Measurement - multiple wounds 4 5 INTERVENTIONS - Wound Dressings X - Small Wound Dressing one or multiple wounds 4 10 []  - Medium Wound Dressing one or multiple wounds 0 []  - Large Wound Dressing one or multiple wounds 0 []  - Application of Medications - topical 0 []  - Application of Medications - injection 0 INTERVENTIONS - Miscellaneous []  - External ear exam 0 [name], [name] ([id number]) []  - Specimen Collection (cultures, biopsies, blood, body fluids, etc.) 0 []  - Specimen(s) / Culture(s) sent or taken to Lab for analysis 0 []  - Patient Transfer (multiple staff / [name] / Similar devices) 0 []  - Simple Staple / Suture removal (25 or less) 0 []  - Complex Staple / Suture removal (26 or more) 0 []  - Hypo / Hyperglycemic Management (close monitor of Blood Glucose) 0 []  - Ankle / Brachial Index (ABI) - do not check if billed separately 0 X - Vital Signs 1 5 Has the patient been seen at the hospital within the last three years: Yes Total Score: 160 Level Of Care: New/Established - Level 5 Electronic Signature(s) Signed: 09/19/2015 5:37:28 PM By: Alric Quan Entered By: Alric Quan on 09/19/2015 17:13:04 [name], [name] ([id number]) -------------------------------------------------------------------------------- Encounter Discharge Information Details Patient Name: [name], [name] Date of Service: 09/19/2015 3:30 PM Medical Record Number: [id number] Patient Account Number: [id number] Date of Birth/Sex: 12-10-1945 (69 y.o. Female) Treating RN: Carolyne Fiscal, Debi Primary Care Physician: Paulita Cradle Other Clinician: Referring Physician: Paulita Cradle Treating Physician/Extender: Frann Rider  in Treatment: 64 Encounter Discharge Information Items Discharge Pain Level: 0 Discharge Condition: Stable Ambulatory Status: Walker Discharge Destination: Home Transportation: Private Auto Accompanied By: husband Schedule Follow-up Appointment: Yes Medication Reconciliation completed Yes and provided to Patient/Care Ermina Oberman: Provided on Clinical Summary of Care: 09/19/2015 Form Type Recipient Paper Patient AS Electronic Signature(s) Signed: 09/19/2015 4:49:46 PM By: Ruthine Dose Entered By: Ruthine Dose on 09/19/2015 16:49:46 Boxell, Romayne [name]. ([id number]) -------------------------------------------------------------------------------- Lower Extremity Assessment Details Patient Name: [name], [name] Date of Service: 09/19/2015 3:30 PM Medical Record Number: [id number] Patient Account Number: [id number] Date of Birth/Sex: 1946-04-20 (69 y.o. Female) Treating RN: Cornell Barman Primary Care Physician: Paulita Cradle Other Clinician: Referring Physician: Paulita Cradle Treating Physician/Extender: Frann Rider in Treatment: 27 Vascular Assessment Pulses: Posterior Tibial Dorsalis Pedis Palpable: [Left:Yes] [Right:Yes] Extremity colors, hair growth, and conditions: Extremity Color: [Left:Normal] [Right:Normal] Hair Growth on Extremity: [Left:No] [Right:No] Temperature of Extremity: [Left:Warm] [Right:Warm] Capillary Refill: [Left:< 3 seconds] [Right:< 3 seconds] Toe Nail Assessment Left: Right: Thick: No No Discolored: No No Deformed: No No Improper Length and Hygiene: No No Electronic Signature(s) Signed: 09/19/2015 5:51:05 PM By: Gretta Cool, RN, BSN, Kim RN, BSN Entered By: Gretta Cool, RN, BSN, Kim on 09/19/2015 16:20:38 Kwan, Ghadeer [name]. ([id number]) -------------------------------------------------------------------------------- Multi Wound Chart Details Patient Name: [name], Aliveah [name]. Date of Service: 09/19/2015 3:30 PM Medical Record Number: [id number] Patient Account  Number: [id number] Date of Birth/Sex: [birth year] (69 y.o. Female) Treating RN: Cornell Barman Primary Care Physician: Paulita Cradle Other Clinician: Referring Physician: Paulita Cradle Treating Physician/Extender: Frann Rider in Treatment: 27 Vital Signs Height(in): 65 Pulse(bpm): 117 Weight(lbs): 118 Blood Pressure 148/78 (mmHg): Body Mass Index(BMI): 20 Temperature(F): 97.9 Respiratory Rate 18 (breaths/min): Photos: [10:No Photos] [3:No Photos] [7:No Photos] Wound Location: [10:Right Flank - Medial] [3:Back - Midline] [7:Right Ischial Tuberosity] Wounding Event: [10:Gradually Appeared] [3:Pressure Injury] [7:Pressure Injury] Primary Etiology: [  10:Pressure Ulcer] [3:Pressure Ulcer] [7:Pressure Ulcer] Comorbid History: [10:Cataracts, Asthma, Hypertension, Type II Diabetes, Neuropathy, Received Chemotherapy] [3:Cataracts, Asthma, Hypertension, Type II Diabetes, Neuropathy, Received Chemotherapy] [7:Cataracts, Asthma, Hypertension, Type II Diabetes,  Neuropathy, Received Chemotherapy] Date Acquired: [10:09/12/2015] [3:05/02/2015] [7:08/01/2015] Weeks of Treatment: [10:0] [3:20] [7:6] Wound Status: [10:Open] [3:Open] [7:Open] Measurements L x W x D 0.5x0.6x0.1 [3:1.7x0.7x0.1] [7:0.7x0.8x0.1] (cm) Area (cm) : [10:0.236] [3:0.935] [7:0.44] Volume (cm) : [10:0.024] [3:0.093] [7:0.044] % Reduction in Area: [10:N/A] [3:-325.00%] [7:86.00%] % Reduction in Volume: N/A [3:-322.70%] [7:86.00%] Classification: [10:Category/Stage II] [3:Category/Stage II] [7:Category/Stage III] HBO Classification: [10:N/A] [3:N/A] [7:N/A] Exudate Amount: [10:Medium] [3:Medium] [7:Medium] Exudate Type: [10:Serous] [3:Serous] [7:Serosanguineous] Exudate Color: [10:amber] [3:amber] [7:red, brown] Wound Margin: [10:Flat and Intact] [3:Distinct, outline attached] [7:Distinct, outline attached] Granulation Amount: [10:None Present (0%)] [3:Large (67-100%)] [7:None Present (0%)] Granulation  Quality: [10:N/A] [3:Red, Pink] [7:N/A] Necrotic Amount: [10:Large (67-100%)] [3:Small (1-33%)] [7:Large (67-100%)] Necrotic Tissue: [10:Eschar, Adherent Slough] [3:Adherent Slough] [7:Adherent Slough] Exposed Structures: [10:Fascia: No Fat: No Tendon: No] [3:Fascia: No Fat: No Tendon: No] [7:Fascia: No Fat: No Tendon: No] Muscle: No Muscle: No Muscle: No Joint: No Joint: No Joint: No Bone: No Bone: No Bone: No Limited to Skin Limited to Skin Limited to Skin Breakdown Breakdown Breakdown Epithelialization: None None None Periwound Skin Texture: No Abnormalities Noted Edema: Yes Edema: Yes Excoriation: No Excoriation: No Induration: No Induration: No Callus: No Callus: No Crepitus: No Crepitus: No Fluctuance: No Fluctuance: No Friable: No Friable: No Rash: No Rash: No Scarring: No Scarring: No Periwound Skin No Abnormalities Noted Moist: Yes Moist: Yes Moisture: Maceration: No Maceration: No Dry/Scaly: No Dry/Scaly: No Periwound Skin Color: No Abnormalities Noted Atrophie Blanche: No Atrophie Blanche: No Cyanosis: No Cyanosis: No Ecchymosis: No Ecchymosis: No Erythema: No Erythema: No Hemosiderin Staining: No Hemosiderin Staining: No Mottled: No Mottled: No Pallor: No Pallor: No Rubor: No Rubor: No Temperature: N/A No Abnormality No Abnormality Tenderness on No Yes No Palpation: Wound Preparation: Ulcer Cleansing: Ulcer Cleansing: Ulcer Cleansing: Rinsed/Irrigated with Rinsed/Irrigated with Rinsed/Irrigated with Saline Saline Saline Topical Anesthetic Topical Anesthetic Topical Anesthetic Applied: Other: lidocaine Applied: Other: lidocaine Applied: Other: lidocaine 4% 4% 4% Wound Number: 8 9 N/A Photos: No Photos No Photos N/A Wound Location: Left Trochanter Left Malleolus N/A Wounding Event: Gradually Appeared Gradually Appeared N/A Primary Etiology: Pressure Ulcer Pressure Ulcer N/A Comorbid History: Cataracts, Asthma, Cataracts, Asthma,  N/A Hypertension, Type II Hypertension, Type II Diabetes, Neuropathy, Diabetes, Neuropathy, Received Chemotherapy Received Chemotherapy Date Acquired: 09/12/2015 09/12/2015 N/A Weeks of Treatment: 0 0 N/A Wound Status: Open Open N/A 0.5x0.5x0.1 0.3x0.2x0.2 N/A Forgette, Chamari [name]. ([id number]) Measurements L x W x D (cm) Area (cm) : 0.196 0.047 N/A Volume (cm) : 0.02 0.009 N/A % Reduction in Area: N/A N/A N/A % Reduction in Volume: N/A N/A N/A Classification: Category/Stage II Category/Stage II N/A HBO Classification: N/A Grade 0 N/A Exudate Amount: Medium Medium N/A Exudate Type: Serous Serous N/A Exudate Color: amber amber N/A Wound Margin: Flat and Intact Flat and Intact N/A Granulation Amount: None Present (0%) Large (67-100%) N/A Granulation Quality: N/A Pink N/A Necrotic Amount: Large (67-100%) Small (1-33%) N/A Necrotic Tissue: Adherent Slough Adherent Slough N/A Exposed Structures: Fascia: No Fascia: No N/A Fat: No Fat: No Tendon: No Tendon: No Muscle: No Muscle: No Joint: No Joint: No Bone: No Bone: No Limited to Skin Limited to Skin Breakdown Breakdown Epithelialization: None None N/A Periwound Skin Texture: No Abnormalities Noted No Abnormalities Noted N/A Periwound Skin Moist: Yes Moist: Yes N/A Moisture: Periwound Skin Color: No Abnormalities Noted  No Abnormalities Noted N/A Temperature: N/A No Abnormality N/A Tenderness on No Yes N/A Palpation: Wound Preparation: Ulcer Cleansing: Ulcer Cleansing: N/A Rinsed/Irrigated with Rinsed/Irrigated with Saline Saline Topical Anesthetic Topical Anesthetic Applied: Other: lidocaine Applied: Other: lidocaine 4% 4% Treatment Notes Electronic Signature(s) Signed: 09/19/2015 5:51:05 PM By: Gretta Cool, RN, BSN, Kim RN, BSN Entered By: Gretta Cool, RN, BSN, Kim on 09/19/2015 16:20:52 Welford, Larrisha Lemmie Evens ([id number]) -------------------------------------------------------------------------------- Multi-Disciplinary Care Plan  Details Patient Name: [name], Belina [name]. Date of Service: 09/19/2015 3:30 PM Medical Record Number: [id number] Patient Account Number: [id number] Date of Birth/Sex: 1946/08/07 (69 y.o. Female) Treating RN: Cornell Barman Primary Care Physician: Paulita Cradle Other Clinician: Referring Physician: Paulita Cradle Treating Physician/Extender: Frann Rider in Treatment: 29 Active Inactive Abuse / Safety / Falls / Self Care Management Nursing Diagnoses: Impaired home maintenance Impaired physical mobility Knowledge deficit related to: safety; personal, health (wound), emergency Potential for falls Self care deficit: actual or potential Goals: Patient will remain injury free Date Initiated: 03/13/2015 Goal Status: Active Patient/caregiver will verbalize understanding of skin care regimen Date Initiated: 03/13/2015 Goal Status: Active Patient/caregiver will verbalize/demonstrate measure taken to improve self care Date Initiated: 03/13/2015 Goal Status: Active Patient/caregiver will verbalize/demonstrate measures taken to improve the patient's personal safety Date Initiated: 03/13/2015 Goal Status: Active Patient/caregiver will verbalize/demonstrate measures taken to prevent injury and/or falls Date Initiated: 03/13/2015 Goal Status: Active Patient/caregiver will verbalize/demonstrate understanding of what to do in case of emergency Date Initiated: 03/13/2015 Goal Status: Active Interventions: Assess fall risk on admission and as needed Assess: immobility, friction, shearing, incontinence upon admission and as needed Assess impairment of mobility on admission and as needed per policy Assess self care needs on admission and as needed Provide education on basic hygiene Gadsden, McDermott. ([id number]) Provide education on fall prevention Provide education on personal and home safety Provide education on safe transfers Treatment Activities: Education provided on Basic Hygiene :  03/13/2015 Notes: Orientation to the Wound Care Program Nursing Diagnoses: Knowledge deficit related to the wound healing center program Goals: Patient/caregiver will verbalize understanding of the [place] Program Date Initiated: 03/13/2015 Goal Status: Active Interventions: Provide education on orientation to the wound center Notes: Pressure Nursing Diagnoses: Knowledge deficit related to causes and risk factors for pressure ulcer development Knowledge deficit related to management of pressures ulcers Potential for impaired tissue integrity related to pressure, friction, moisture, and shear Goals: Patient will remain free from development of additional pressure ulcers Date Initiated: 03/13/2015 Goal Status: Active Patient will remain free of pressure ulcers Date Initiated: 03/13/2015 Goal Status: Active Patient/caregiver will verbalize risk factors for pressure ulcer development Date Initiated: 03/13/2015 Goal Status: Active Patient/caregiver will verbalize understanding of pressure ulcer management Date Initiated: 03/13/2015 Goal Status: Active Interventions: Assess: immobility, friction, shearing, incontinence upon admission and as needed Ballweg, Kimeka [name]. ([id number]) Assess offloading mechanisms upon admission and as needed Assess potential for pressure ulcer upon admission and as needed Provide education on pressure ulcers Treatment Activities: Patient referred for home evaluation of offloading devices/mattresses : 09/19/2015 Patient referred for pressure reduction/relief devices : 09/19/2015 Patient referred for seating evaluation to ensure proper offloading : 09/19/2015 Pressure reduction/relief device ordered : 09/19/2015 Test ordered outside of clinic : 09/19/2015 Notes: Wound/Skin Impairment Nursing Diagnoses: Impaired tissue integrity Knowledge deficit related to ulceration/compromised skin integrity Goals: Patient/caregiver will verbalize understanding of skin  care regimen Date Initiated: 03/13/2015 Goal Status: Active Ulcer/skin breakdown will have a volume reduction of 30% by week 4 Date Initiated: 03/13/2015 Goal Status: Active Ulcer/skin breakdown will  have a volume reduction of 50% by week 8 Date Initiated: 03/13/2015 Goal Status: Active Ulcer/skin breakdown will have a volume reduction of 80% by week 12 Date Initiated: 03/13/2015 Goal Status: Active Ulcer/skin breakdown will heal within 14 weeks Date Initiated: 03/13/2015 Goal Status: Active Interventions: Assess patient/caregiver ability to obtain necessary supplies Assess patient/caregiver ability to perform ulcer/skin care regimen upon admission and as needed Assess ulceration(s) every visit Provide education on smoking Provide education on ulcer and skin care Treatment Activities: Patient referred to home care : 09/19/2015 UMPIERRE, Lashika [name]. ([id number]) Referred to DME [facility] for dressing supplies : 09/19/2015 Skin care regimen initiated : 09/19/2015 Topical wound management initiated : 09/19/2015 Notes: Electronic Signature(s) Signed: 09/19/2015 5:51:05 PM By: Gretta Cool, RN, BSN, Kim RN, BSN Entered By: Gretta Cool, RN, BSN, Kim on 09/19/2015 16:20:45 Worden, Mirriam [name]. ([id number]) -------------------------------------------------------------------------------- Pain Assessment Details Patient Name: Guardia, Maelin [name]. Date of Service: 09/19/2015 3:30 PM Medical Record Number: [id number] Patient Account Number: [id number] Date of Birth/Sex: April 24, 1946 (69 y.o. Female) Treating RN: Cornell Barman Primary Care Physician: Paulita Cradle Other Clinician: Referring Physician: Paulita Cradle Treating Physician/Extender: Frann Rider in Treatment: 27 Active Problems Location of Pain Severity and Description of Pain Patient Has Paino No Site Locations Pain Management and Medication Current Pain Management: Electronic Signature(s) Signed: 09/19/2015 5:51:05 PM By: Gretta Cool, RN, BSN, Kim RN,  BSN Entered By: Gretta Cool, RN, BSN, Kim on 09/19/2015 15:59:48 Hanshaw, An Lemmie Evens ([id number]) -------------------------------------------------------------------------------- Patient/Caregiver Education Details Patient Name: [name], Channa [name]. Date of Service: 09/19/2015 3:30 PM Medical Record Number: [id number] Patient Account Number: [id number] Date of Birth/Gender: Jan 29, 1946 (70 y.o. Female) Treating RN: Carolyne Fiscal, Debi Primary Care Physician: Paulita Cradle Other Clinician: Referring Physician: Paulita Cradle Treating Physician/Extender: Frann Rider in Treatment: 82 Education Assessment Education Provided To: Patient and Caregiver Education Topics Provided Wound/Skin Impairment: Handouts: Other: chANGE DRESSING AS DIRECTED Methods: Demonstration, Explain/Verbal Responses: State content correctly Electronic Signature(s) Signed: 09/19/2015 5:37:28 PM By: Alric Quan Entered By: Alric Quan on 09/19/2015 16:49:20 Renwick, Loyal. ([id number]) -------------------------------------------------------------------------------- Wound Assessment Details Patient Name: [name], [name] Date of Service: 09/19/2015 3:30 PM Medical Record Number: [id number] Patient Account Number: [id number] Date of Birth/Sex: 08/21/1946 (69 y.o. Female) Treating RN: Cornell Barman Primary Care Physician: Paulita Cradle Other Clinician: Referring Physician: Paulita Cradle Treating Physician/Extender: Frann Rider in Treatment: 27 Wound Status Wound Number: 10 Primary Pressure Ulcer Etiology: Wound Location: Right Flank - Medial Wound Open Wounding Event: Gradually Appeared Status: Date Acquired: 09/12/2015 Comorbid Cataracts, Asthma, Hypertension, Type Weeks Of Treatment: 0 History: II Diabetes, Neuropathy, Received Clustered Wound: No Chemotherapy Photos Photo Uploaded By: Gretta Cool, RN, BSN, Kim on 09/19/2015 16:59:50 Wound Measurements Length: (cm) 0.5 Width: (cm)  0.6 Depth: (cm) 0.1 Area: (cm) 0.236 Volume: (cm) 0.024 % Reduction in Area: % Reduction in Volume: Epithelialization: None Tunneling: No Undermining: No Wound Description Classification: Category/Stage II Wound Margin: Flat and Intact Exudate Amount: Medium Exudate Type: Serous Exudate Color: amber Foul Odor After Cleansing: No Wound Bed Granulation Amount: None Present (0%) Exposed Structure Necrotic Amount: Large (67-100%) Fascia Exposed: No Necrotic Quality: Eschar, Adherent Slough Fat Layer Exposed: No Tendon Exposed: No Borboa, Yamaris [name]. ([id number]) Muscle Exposed: No Joint Exposed: No Bone Exposed: No Limited to Skin Breakdown Periwound Skin Texture Texture Color No Abnormalities Noted: No No Abnormalities Noted: No Moisture No Abnormalities Noted: No Wound Preparation Ulcer Cleansing: Rinsed/Irrigated with Saline Topical Anesthetic Applied: Other: lidocaine 4%, Electronic Signature(s) Signed: 09/19/2015 5:51:05 PM By: Gretta Cool, RN, BSN, Kim RN, BSN Entered By: Gretta Cool,  RN, BSN, Kim on 09/19/2015 16:19:44 Hoagland, Julia [name]. ([id number]) -------------------------------------------------------------------------------- Wound Assessment Details Patient Name: [name], [name] Date of Service: 09/19/2015 3:30 PM Medical Record Number: [id number] Patient Account Number: [id number] Date of Birth/Sex: [birth year] (69 y.o. Female) Treating RN: Cornell Barman Primary Care Physician: Paulita Cradle Other Clinician: Referring Physician: Paulita Cradle Treating Physician/Extender: Frann Rider in Treatment: 27 Wound Status Wound Number: 3 Primary Pressure Ulcer Etiology: Wound Location: Back - Midline Wound Open Wounding Event: Pressure Injury Status: Date Acquired: 05/02/2015 Comorbid Cataracts, Asthma, Hypertension, Type Weeks Of Treatment: 20 History: II Diabetes, Neuropathy, Received Clustered Wound: No Chemotherapy Photos Wound Measurements Length: (cm)  1.7 Width: (cm) 0.7 Depth: (cm) 0.1 Area: (cm) 0.935 Volume: (cm) 0.093 % Reduction in Area: -325% % Reduction in Volume: -322.7% Epithelialization: None Tunneling: No Undermining: No Wound Description Classification: Category/Stage II Wound Margin: Distinct, outline attached Exudate Amount: Medium Exudate Type: Serous Exudate Color: amber Foul Odor After Cleansing: No Wound Bed Granulation Amount: Large (67-100%) Exposed Structure Granulation Quality: Red, Pink Fascia Exposed: No Necrotic Amount: Small (1-33%) Fat Layer Exposed: No Necrotic Quality: Adherent Slough Tendon Exposed: No Muscle Exposed: No Gayler, Tempie [name]. ([id number]) Joint Exposed: No Bone Exposed: No Limited to Skin Breakdown Periwound Skin Texture Texture Color No Abnormalities Noted: No No Abnormalities Noted: No Callus: No Atrophie Blanche: No Crepitus: No Cyanosis: No Excoriation: No Ecchymosis: No Fluctuance: No Erythema: No Friable: No Hemosiderin Staining: No Induration: No Mottled: No Localized Edema: Yes Pallor: No Rash: No Rubor: No Scarring: No Temperature / Pain Moisture Temperature: No Abnormality No Abnormalities Noted: No Tenderness on Palpation: Yes Dry / Scaly: No Maceration: No Moist: Yes Wound Preparation Ulcer Cleansing: Rinsed/Irrigated with Saline Topical Anesthetic Applied: Other: lidocaine 4%, Electronic Signature(s) Signed: 09/19/2015 5:51:05 PM By: Gretta Cool, RN, BSN, Kim RN, BSN Entered By: Gretta Cool, RN, BSN, Kim on 09/19/2015 17:02:30 [name], [name] ([id number]) -------------------------------------------------------------------------------- Wound Assessment Details Patient Name: [name], [name] Date of Service: 09/19/2015 3:30 PM Medical Record Number: [id number] Patient Account Number: [id number] Date of Birth/Sex: [birth year] (69 y.o. Female) Treating RN: Cornell Barman Primary Care Physician: Paulita Cradle Other Clinician: Referring Physician: Paulita Cradle Treating Physician/Extender: Frann Rider in Treatment: 27 Wound Status Wound Number: 7 Primary Pressure Ulcer Etiology: Wound Location: Right Ischial Tuberosity Wound Open Wounding Event: Pressure Injury Status: Date Acquired: 08/01/2015 Comorbid Cataracts, Asthma, Hypertension, Type Weeks Of Treatment: 6 History: II Diabetes, Neuropathy, Received Clustered Wound: No Chemotherapy Photos Photo Uploaded By: Gretta Cool, RN, BSN, Kim on 09/19/2015 17:01:09 Wound Measurements Length: (cm) 0.7 Width: (cm) 0.8 Depth: (cm) 0.1 Area: (cm) 0.44 Volume: (cm) 0.044 % Reduction in Area: 86% % Reduction in Volume: 86% Epithelialization: None Tunneling: No Undermining: No Wound Description Classification: Category/Stage III Wound Margin: Distinct, outline attached Exudate Amount: Medium Exudate Type: Serosanguineous Exudate Color: red, brown Foul Odor After Cleansing: No Wound Bed Granulation Amount: None Present (0%) Exposed Structure Necrotic Amount: Large (67-100%) Fascia Exposed: No Necrotic Quality: Adherent Slough Fat Layer Exposed: No Tendon Exposed: No [place], Naziya [name]. ([id number]) Muscle Exposed: No Joint Exposed: No Bone Exposed: No Limited to Skin Breakdown Periwound Skin Texture Texture Color No Abnormalities Noted: No No Abnormalities Noted: No Callus: No Atrophie Blanche: No Crepitus: No Cyanosis: No Excoriation: No Ecchymosis: No Fluctuance: No Erythema: No Friable: No Hemosiderin Staining: No Induration: No Mottled: No Localized Edema: Yes Pallor: No Rash: No Rubor: No Scarring: No Temperature / Pain Moisture Temperature: No Abnormality No Abnormalities Noted: No Dry / Scaly: No Maceration: No Moist:  Yes Wound Preparation Ulcer Cleansing: Rinsed/Irrigated with Saline Topical Anesthetic Applied: Other: lidocaine 4%, Treatment Notes Wound #7 (Right Ischial Tuberosity) 1. Cleansed with: Clean wound with Normal Saline 2.  Anesthetic Topical Lidocaine 4% cream to wound bed prior to debridement 3. Peri-wound Care: Skin Prep 4. Dressing Applied: Santyl Ointment 5. Secondary Dressing Applied Bordered Foam Dressing Dry Gauze Electronic Signature(s) Signed: 09/19/2015 5:51:05 PM By: Gretta Cool, RN, BSN, Kim RN, BSN Entered By: Gretta Cool, RN, BSN, Kim on 09/19/2015 16:15:58 [place], Suzette [name]. ([id number]) -------------------------------------------------------------------------------- Wound Assessment Details Patient Name: [name], [name] Date of Service: 09/19/2015 3:30 PM Medical Record Number: [id number] Patient Account Number: [id number] Date of Birth/Sex: 12/18/1945 (69 y.o. Female) Treating RN: Cornell Barman Primary Care Physician: Paulita Cradle Other Clinician: Referring Physician: Paulita Cradle Treating Physician/Extender: Frann Rider in Treatment: 27 Wound Status Wound Number: 8 Primary Pressure Ulcer Etiology: Wound Location: Left Trochanter Wound Open Wounding Event: Gradually Appeared Status: Date Acquired: 09/12/2015 Comorbid Cataracts, Asthma, Hypertension, Type Weeks Of Treatment: 0 History: II Diabetes, Neuropathy, Received Clustered Wound: No Chemotherapy Photos Photo Uploaded By: Gretta Cool, RN, BSN, Kim on 09/19/2015 17:01:09 Wound Measurements Length: (cm) 0.5 Width: (cm) 0.5 Depth: (cm) 0.1 Area: (cm) 0.196 Volume: (cm) 0.02 % Reduction in Area: % Reduction in Volume: Epithelialization: None Tunneling: No Undermining: No Wound Description Classification: Category/Stage II Wound Margin: Flat and Intact Exudate Amount: Medium Exudate Type: Serous Exudate Color: amber Foul Odor After Cleansing: No Wound Bed Granulation Amount: None Present (0%) Exposed Structure Necrotic Amount: Large (67-100%) Fascia Exposed: No Necrotic Quality: Adherent Slough Fat Layer Exposed: No Tendon Exposed: No [place], Jamella [name]. ([id number]) Muscle Exposed: No Joint Exposed: No Bone  Exposed: No Limited to Skin Breakdown Periwound Skin Texture Texture Color No Abnormalities Noted: No No Abnormalities Noted: No Moisture No Abnormalities Noted: No Moist: Yes Wound Preparation Ulcer Cleansing: Rinsed/Irrigated with Saline Topical Anesthetic Applied: Other: lidocaine 4%, Electronic Signature(s) Signed: 09/19/2015 5:51:05 PM By: Gretta Cool, RN, BSN, Kim RN, BSN Entered By: Gretta Cool, RN, BSN, Kim on 09/19/2015 16:14:39 [place], Shevaun [name]. ([id number]) -------------------------------------------------------------------------------- Wound Assessment Details Patient Name: [name], [name] Date of Service: 09/19/2015 3:30 PM Medical Record Number: [id number] Patient Account Number: [id number] Date of Birth/Sex: 1945/10/06 (69 y.o. Female) Treating RN: Cornell Barman Primary Care Physician: Paulita Cradle Other Clinician: Referring Physician: Paulita Cradle Treating Physician/Extender: Frann Rider in Treatment: 27 Wound Status Wound Number: 9 Primary Pressure Ulcer Etiology: Wound Location: Left Malleolus Wound Open Wounding Event: Gradually Appeared Status: Date Acquired: 09/12/2015 Comorbid Cataracts, Asthma, Hypertension, Type Weeks Of Treatment: 0 History: II Diabetes, Neuropathy, Received Clustered Wound: No Chemotherapy Wound Measurements Length: (cm) 0.3 Width: (cm) 0.2 Depth: (cm) 0.2 Area: (cm) 0.047 Volume: (cm) 0.009 % Reduction in Area: 0% % Reduction in Volume: 0% Epithelialization: None Tunneling: No Undermining: No Wound Description Classification: Category/Stage II Foul Odor Af Diabetic Severity ([place]): Grade 0 Wound Margin: Flat and Intact Exudate Amount: Medium Exudate Type: Serous Exudate Color: amber ter Cleansing: No Wound Bed Granulation Amount: Large (67-100%) Exposed Structure Granulation Quality: Pink Fascia Exposed: No Necrotic Amount: Small (1-33%) Fat Layer Exposed: No Necrotic Quality: Adherent Slough Tendon  Exposed: No Muscle Exposed: No Joint Exposed: No Bone Exposed: No Limited to Skin Breakdown Periwound Skin Texture Texture Color No Abnormalities Noted: No No Abnormalities Noted: No Moisture Temperature / Pain No Abnormalities Noted: No Temperature: No Abnormality [place], [name] ([id number]) Moist: Yes Tenderness on Palpation: Yes Wound Preparation Ulcer Cleansing: Rinsed/Irrigated with Saline Topical Anesthetic Applied: Other: lidocaine 4%, Electronic Signature(s)  Signed: 09/19/2015 5:51:05 PM By: Gretta Cool, RN, BSN, Kim RN, BSN Entered By: Gretta Cool, RN, BSN, Kim on 09/19/2015 17:05:01 Baray, Laural [name]. ([id number]) -------------------------------------------------------------------------------- Vitals Details Patient Name: [name], Saniyya [name]. Date of Service: 09/19/2015 3:30 PM Medical Record Number: [id number] Patient Account Number: [id number] Date of Birth/Sex: [birth year] (69 y.o. Female) Treating RN: Cornell Barman Primary Care Physician: Paulita Cradle Other Clinician: Referring Physician: Paulita Cradle Treating Physician/Extender: Frann Rider in Treatment: 27 Vital Signs Time Taken: 16:00 Temperature (F): 97.9 Height (in): 65 Pulse (bpm): 117 Weight (lbs): 118 Respiratory Rate (breaths/min): 18 Body Mass Index (BMI): 19.6 Blood Pressure (mmHg): 148/78 Reference Range: 80 - 120 mg / dl Electronic Signature(s) Signed: 09/19/2015 5:51:05 PM By: Gretta Cool, RN, BSN, Kim RN, BSN Entered By: Gretta Cool, RN, BSN, Kim on 09/19/2015 16:03:03

## 2015-09-23 ENCOUNTER — Emergency Department: Payer: Medicare Other

## 2015-09-23 ENCOUNTER — Inpatient Hospital Stay
Admission: EM | Admit: 2015-09-23 | Discharge: 2015-09-30 | DRG: 871 | Disposition: A | Discharge status: Home Health | Payer: Medicare Other | Attending: Specialist | Admitting: Specialist

## 2015-09-23 DIAGNOSIS — E039 Hypothyroidism, unspecified: Secondary | ICD-10-CM | POA: Diagnosis present

## 2015-09-23 DIAGNOSIS — E1142 Type 2 diabetes mellitus with diabetic polyneuropathy: Secondary | ICD-10-CM | POA: Diagnosis present

## 2015-09-23 DIAGNOSIS — E860 Dehydration: Secondary | ICD-10-CM | POA: Diagnosis present

## 2015-09-23 DIAGNOSIS — R1032 Left lower quadrant pain: Secondary | ICD-10-CM

## 2015-09-23 DIAGNOSIS — R410 Disorientation, unspecified: Secondary | ICD-10-CM | POA: Diagnosis present

## 2015-09-23 DIAGNOSIS — E43 Unspecified severe protein-calorie malnutrition: Secondary | ICD-10-CM | POA: Diagnosis present

## 2015-09-23 DIAGNOSIS — L89223 Pressure ulcer of left hip, stage 3: Secondary | ICD-10-CM | POA: Diagnosis present

## 2015-09-23 DIAGNOSIS — Y95 Nosocomial condition: Secondary | ICD-10-CM | POA: Diagnosis present

## 2015-09-23 DIAGNOSIS — K5900 Constipation, unspecified: Secondary | ICD-10-CM | POA: Diagnosis present

## 2015-09-23 DIAGNOSIS — A4152 Sepsis due to Pseudomonas: Secondary | ICD-10-CM | POA: Diagnosis not present

## 2015-09-23 DIAGNOSIS — Z95828 Presence of other vascular implants and grafts: Secondary | ICD-10-CM

## 2015-09-23 DIAGNOSIS — Z853 Personal history of malignant neoplasm of breast: Secondary | ICD-10-CM

## 2015-09-23 DIAGNOSIS — L89210 Pressure ulcer of right hip, unstageable: Secondary | ICD-10-CM | POA: Diagnosis present

## 2015-09-23 DIAGNOSIS — Z79899 Other long term (current) drug therapy: Secondary | ICD-10-CM

## 2015-09-23 DIAGNOSIS — L89519 Pressure ulcer of right ankle, unspecified stage: Secondary | ICD-10-CM | POA: Diagnosis present

## 2015-09-23 DIAGNOSIS — K838 Other specified diseases of biliary tract: Secondary | ICD-10-CM

## 2015-09-23 DIAGNOSIS — Z885 Allergy status to narcotic agent status: Secondary | ICD-10-CM

## 2015-09-23 DIAGNOSIS — Z881 Allergy status to other antibiotic agents status: Secondary | ICD-10-CM

## 2015-09-23 DIAGNOSIS — Z681 Body mass index (BMI) 19 or less, adult: Secondary | ICD-10-CM

## 2015-09-23 DIAGNOSIS — L89159 Pressure ulcer of sacral region, unspecified stage: Secondary | ICD-10-CM | POA: Diagnosis present

## 2015-09-23 DIAGNOSIS — G629 Polyneuropathy, unspecified: Secondary | ICD-10-CM | POA: Diagnosis present

## 2015-09-23 DIAGNOSIS — E876 Hypokalemia: Secondary | ICD-10-CM | POA: Diagnosis present

## 2015-09-23 DIAGNOSIS — A419 Sepsis, unspecified organism: Secondary | ICD-10-CM | POA: Diagnosis not present

## 2015-09-23 DIAGNOSIS — R339 Retention of urine, unspecified: Secondary | ICD-10-CM | POA: Diagnosis present

## 2015-09-23 DIAGNOSIS — J151 Pneumonia due to Pseudomonas: Secondary | ICD-10-CM | POA: Diagnosis present

## 2015-09-23 DIAGNOSIS — L89629 Pressure ulcer of left heel, unspecified stage: Secondary | ICD-10-CM | POA: Diagnosis present

## 2015-09-23 DIAGNOSIS — K219 Gastro-esophageal reflux disease without esophagitis: Secondary | ICD-10-CM | POA: Diagnosis present

## 2015-09-23 DIAGNOSIS — E872 Acidosis: Secondary | ICD-10-CM | POA: Diagnosis present

## 2015-09-23 DIAGNOSIS — I10 Essential (primary) hypertension: Secondary | ICD-10-CM | POA: Diagnosis present

## 2015-09-23 DIAGNOSIS — J690 Pneumonitis due to inhalation of food and vomit: Secondary | ICD-10-CM | POA: Diagnosis present

## 2015-09-23 DIAGNOSIS — F17210 Nicotine dependence, cigarettes, uncomplicated: Secondary | ICD-10-CM | POA: Diagnosis present

## 2015-09-23 DIAGNOSIS — R0600 Dyspnea, unspecified: Secondary | ICD-10-CM

## 2015-09-23 DIAGNOSIS — R5383 Other fatigue: Secondary | ICD-10-CM

## 2015-09-23 DIAGNOSIS — G894 Chronic pain syndrome: Secondary | ICD-10-CM

## 2015-09-23 LAB — COMPREHENSIVE METABOLIC PANEL
ALBUMIN: 3.8 g/dL (ref 3.5–5.0)
ALK PHOS: 266 U/L — AB (ref 38–126)
ALT: 27 U/L (ref 14–54)
ANION GAP: 6 (ref 5–15)
AST: 26 U/L (ref 15–41)
BILIRUBIN TOTAL: 1.1 mg/dL (ref 0.3–1.2)
BUN: 22 mg/dL — AB (ref 6–20)
CO2: 20 mmol/L — AB (ref 22–32)
Calcium: 10 mg/dL (ref 8.9–10.3)
Chloride: 113 mmol/L — ABNORMAL HIGH (ref 101–111)
Creatinine, Ser: 0.64 mg/dL (ref 0.44–1.00)
GFR calc Af Amer: >60 mL/min (ref >60)
GFR calc non Af Amer: >60 mL/min (ref >60)
GLUCOSE: 126 mg/dL — AB (ref 65–99)
POTASSIUM: 3.7 mmol/L (ref 3.5–5.1)
SODIUM: 139 mmol/L (ref 135–145)
TOTAL PROTEIN: 6.7 g/dL (ref 6.5–8.1)

## 2015-09-23 LAB — CBC
HEMATOCRIT: 31.3 % — AB (ref 35.0–47.0)
HEMOGLOBIN: 10.3 g/dL — AB (ref 12.0–16.0)
MCH: 29.6 pg (ref 26.0–34.0)
MCHC: 32.9 g/dL (ref 32.0–36.0)
MCV: 89.9 fL (ref 80.0–100.0)
Platelets: 240 10*3/uL (ref 150–440)
RBC: 3.49 MIL/uL — ABNORMAL LOW (ref 3.80–5.20)
RDW: 20.6 % — AB (ref 11.5–14.5)
WBC: 17.9 10*3/uL — AB (ref 3.6–11.0)

## 2015-09-23 LAB — TROPONIN I: Troponin I: <0.03 ng/mL (ref <0.031)

## 2015-09-23 MED ORDER — SODIUM CHLORIDE 0.9 % IV BOLUS (SEPSIS)
1000.0000 mL | Freq: Once | INTRAVENOUS | Status: AC
  Administered 2015-09-23: 1000 mL via INTRAVENOUS

## 2015-09-23 NOTE — ED Provider Notes (Signed)
[facility] Emergency Department Provider Note  ____________________________________________  Time seen: Approximately 11:28 PM  I have reviewed the triage vital signs and the nursing notes.   HISTORY  Chief Complaint Fatigue  EM caveat: Patient with some confusion unable to recall events.  HPI [name] is a 70 y.o. female history of recent sepsis, neuropathy, multiple bedsores.  Patient's history comes primarily through her husband as the patient is confused. Husband reports that yesterday she began to seem more tired than usual, she stopped eating well, and that today she would not up or walk to her walker. He is concerned and reports she had same symptoms about 2 weeks ago when she came to the hospital and was diagnosed with infection.  They have been following the wound care clinic and reports her wounds steadily soon be healing. She's not had a fever. She does not have any pain or discomfort except on her feet when she moves her over her wounds when she moves. No nausea or vomiting. No headache.  She continues on Keflex  Past Medical History  Diagnosis Date  . Diabetes mellitus without complication ([place])   . Hypertension   . Neuropathy ([place])   . GERD (gastroesophageal reflux disease)   . Hypothyroidism   . Breast cancer ([place])     breast-left  . Bed sore on heel   . Bed sore on elbow     Patient Active Problem List   Diagnosis Date Noted  . Dehydration 09/24/2015  . Sepsis ([place]) 09/09/2015  . Decubitus ulcer, infected 09/09/2015  . 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 Stage II pressure ulcer of right elbow 07/04/2015  . Stage II pressure ulcer of right heel 07/04/2015  . Fracture, humerus, proximal 04/24/2015    Past Surgical History  Procedure Laterality Date  . Cholecystectomy    . Breast lumpectomy Left   . Orif humerus fracture Right 04/24/2015    Procedure: OPEN REDUCTION INTERNAL FIXATION (ORIF)  PROXIMAL HUMERUS FRACTURE;  Surgeon: Corky Mull, MD;  Location: [facility] ORS;  Service: Orthopedics;  Laterality: Right;  . Esophagogastroduodenoscopy N/A 09/12/2015    Procedure: ESOPHAGOGASTRODUODENOSCOPY (EGD);  Surgeon: Manya Silvas, MD;  Location: [facility] ENDOSCOPY;  Service: Endoscopy;  Laterality: N/A;    No current outpatient prescriptions on file.  Allergies Boniva; Codeine; Erythromycin; and Levaquin  Family History  Problem Relation Age of Onset  . Breast cancer Mother     Social History Social History  Substance Use Topics  . Smoking status: Current Every Day Smoker -- 1.00 packs/day for 40 years  . Smokeless tobacco: None  . Alcohol Use: 0.0 oz/week    0 Standard drinks or equivalent per week     Comment: daily    Review of Systems Constitutional: No fever/chills Eyes: No visual changes. ENT: No sore throat. Cardiovascular: Denies chest pain. Respiratory: Denies shortness of breath. Gastrointestinal: No abdominal pain.  No nausea, no vomiting.  No diarrhea.  No constipation. Genitourinary: Negative for dysuria. Musculoskeletal: Negative for back pain. Skin: See history of present illness Neurological: Negative for headaches, focal weakness or numbness.  10-point ROS otherwise negative.  ____________________________________________   PHYSICAL EXAM:  VITAL SIGNS: ED Triage Vitals  Enc Vitals Group     BP 09/23/15 2219 121/66 mmHg     Pulse Rate 09/23/15 2219 101     Resp 09/23/15 2219 11     Temp 09/23/15 2219 98.4 F (36.9 C)  Temp Source 09/23/15 2219 Oral     SpO2 09/23/15 2219 99 %     Weight 09/23/15 2219 95 lb (43.092 kg)     Height 09/23/15 2219 5\' 6"  (1.676 m)     Head Cir --      Peak Flow --      Pain Score 09/23/15 2222 4     Pain Loc --      Pain Edu? --      Excl. in [place]? --    Constitutional: Alert but slightly slow in responses and disoriented. She is lethargic. Eyes: Conjunctivae are normal. PERRL. EOMI. Head:  Atraumatic. Nose: No congestion/rhinnorhea. Mouth/Throat: Mucous membranes are dry.  Oropharynx non-erythematous. Neck: No stridor.   ardiovascular: Normal rate, regular rhythm. Grossly normal heart sounds.  Good peripheral circulation. Respiratory: Normal respiratory effort.  No retractions. Lungs CTAB. Gastrointestinal: Soft and nontender. No distention. No abdominal bruits. No CVA tenderness. Musculoskeletal: No lower extremity tenderness nor edema.  No joint effusions. Neurologic:  Normal speech and language. No gross focal neurologic deficits are appreciated. Moves all extremities to command. Skin:  Skin is warm, dry and intact except for 3 ulcers noted over her back which are bandaged, with this removed the skin is noted to be erythematous but no deep wound is noted. Consistent with pressure ulcers, without clear evidence of infection. Psychiatric: Mood and affect are normal. Speech and behavior are normal.  ____________________________________________   LABS (all labs ordered are listed, but only abnormal results are displayed)  Labs Reviewed  CBC - Abnormal; Notable for the following:    WBC 17.9 (*)    RBC 3.49 (*)    Hemoglobin 10.3 (*)    HCT 31.3 (*)    RDW 20.6 (*)    All other components within normal limits  COMPREHENSIVE METABOLIC PANEL - Abnormal; Notable for the following:    Chloride 113 (*)    CO2 20 (*)    Glucose, Bld 126 (*)    BUN 22 (*)    Alkaline Phosphatase 266 (*)    All other components within normal limits  URINALYSIS COMPLETEWITH MICROSCOPIC ([facility] ONLY) - Abnormal; Notable for the following:    Color, Urine YELLOW (*)    APPearance HAZY (*)    Squamous Epithelial / LPF 6-30 (*)    All other components within normal limits  CULTURE, BLOOD (ROUTINE X 2)  CULTURE, BLOOD (ROUTINE X 2)  CULTURE, BLOOD (ROUTINE X 2)  CULTURE, BLOOD (ROUTINE X 2)  TROPONIN I  LIPASE, BLOOD  LACTIC ACID, PLASMA  LACTIC ACID, PLASMA  BASIC METABOLIC PANEL  CBC   CBC WITH DIFFERENTIAL/PLATELET  COMPREHENSIVE METABOLIC PANEL  LACTIC ACID, PLASMA  LACTIC ACID, PLASMA  PROCALCITONIN  PROTIME-INR  APTT   ____________________________________________  EKG  ED ECG REPORT I, QUALE, MARK, the attending physician, personally viewed and interpreted this ECG.  Date: 09/23/2015 EKG Time: 2250 Rate: 95 Rhythm: normal sinus rhythm QRS Axis: normal Intervals: normal ST/T Wave abnormalities: normal Conduction Disutrbances: none Narrative Interpretation: unremarkable  ____________________________________________  RADIOLOGY  DG Chest Portable 1 View (Final result) Result time: 09/24/15 00:13:59   Final result by Rad Results In Interface (09/24/15 00:13:59)   Narrative:   CLINICAL DATA: Lethargy. Sepsis.  EXAM: PORTABLE CHEST 1 VIEW  COMPARISON: 09/10/2015  FINDINGS: The cardiomediastinal contours are normal for degree of patient rotation. Improved right basilar aeration with minimal residual atelectasis. No confluent airspace disease. No pleural effusion or pneumothorax. Bones are under mineralized. Multiple old left-sided rib  fractures. Postsurgical change in the right proximal humerus.  IMPRESSION: No active disease.   Electronically Signed By: Jeb Levering M.D. On: 09/24/2015 00:13    ____________________________________________   PROCEDURES  Procedure(s) performed: None  Critical Care performed: No  ____________________________________________   INITIAL IMPRESSION / ASSESSMENT AND PLAN / ED COURSE  Pertinent labs & imaging results that were available during my care of the patient were reviewed by me and considered in my medical decision making (see chart for details).  Patient reviewed presents with lethargy and confusion. She does appear dehydrated but also has significant leukocytosis currently on active treatment with cephalexin for possible MSSA bacteremia or wound infection. Based upon her  significant increase in her white count we will initiate evaluation for infection, including chest x-ray and urinalysis. These are negative and I suspect the patient may have bacteremia. I will initiate vancomycin here and discussed with the hospitalist service. We will readmit and anticipate having the patient is seen again by infectious disease and wound care. Also consider MRI spine. ____________________________________________   FINAL CLINICAL IMPRESSION(S) / ED DIAGNOSES  Final diagnoses:  Sepsis, due to unspecified organism [facility])  Lethargic  Dehydration      Delman Kitten, MD 09/24/15 [phone]

## 2015-09-23 NOTE — ED Notes (Signed)
Resumed care from terry rn.  Pt resting .  Family in with pt.  md at bedside.  Pt denies pain at this time.  nsr on monitor.  Skin warm and dry.

## 2015-09-23 NOTE — ED Notes (Signed)
Patient states lethargy since yesterday.

## 2015-09-24 ENCOUNTER — Encounter: Payer: Self-pay | Admitting: Radiology

## 2015-09-24 ENCOUNTER — Inpatient Hospital Stay: Payer: Medicare Other

## 2015-09-24 DIAGNOSIS — J151 Pneumonia due to Pseudomonas: Secondary | ICD-10-CM | POA: Diagnosis present

## 2015-09-24 DIAGNOSIS — E876 Hypokalemia: Secondary | ICD-10-CM | POA: Diagnosis present

## 2015-09-24 DIAGNOSIS — Z681 Body mass index (BMI) 19 or less, adult: Secondary | ICD-10-CM | POA: Diagnosis not present

## 2015-09-24 DIAGNOSIS — G894 Chronic pain syndrome: Secondary | ICD-10-CM | POA: Diagnosis present

## 2015-09-24 DIAGNOSIS — L89629 Pressure ulcer of left heel, unspecified stage: Secondary | ICD-10-CM | POA: Diagnosis present

## 2015-09-24 DIAGNOSIS — A4152 Sepsis due to Pseudomonas: Secondary | ICD-10-CM | POA: Diagnosis present

## 2015-09-24 DIAGNOSIS — E860 Dehydration: Secondary | ICD-10-CM | POA: Diagnosis present

## 2015-09-24 DIAGNOSIS — E1142 Type 2 diabetes mellitus with diabetic polyneuropathy: Secondary | ICD-10-CM | POA: Diagnosis present

## 2015-09-24 DIAGNOSIS — I10 Essential (primary) hypertension: Secondary | ICD-10-CM | POA: Diagnosis present

## 2015-09-24 DIAGNOSIS — F17210 Nicotine dependence, cigarettes, uncomplicated: Secondary | ICD-10-CM | POA: Diagnosis present

## 2015-09-24 DIAGNOSIS — J690 Pneumonitis due to inhalation of food and vomit: Secondary | ICD-10-CM | POA: Diagnosis present

## 2015-09-24 DIAGNOSIS — R410 Disorientation, unspecified: Secondary | ICD-10-CM | POA: Diagnosis present

## 2015-09-24 DIAGNOSIS — Z881 Allergy status to other antibiotic agents status: Secondary | ICD-10-CM | POA: Diagnosis not present

## 2015-09-24 DIAGNOSIS — Z79899 Other long term (current) drug therapy: Secondary | ICD-10-CM | POA: Diagnosis not present

## 2015-09-24 DIAGNOSIS — E43 Unspecified severe protein-calorie malnutrition: Secondary | ICD-10-CM | POA: Diagnosis present

## 2015-09-24 DIAGNOSIS — Z853 Personal history of malignant neoplasm of breast: Secondary | ICD-10-CM | POA: Diagnosis not present

## 2015-09-24 DIAGNOSIS — L89223 Pressure ulcer of left hip, stage 3: Secondary | ICD-10-CM | POA: Diagnosis present

## 2015-09-24 DIAGNOSIS — K5900 Constipation, unspecified: Secondary | ICD-10-CM | POA: Diagnosis present

## 2015-09-24 DIAGNOSIS — E872 Acidosis: Secondary | ICD-10-CM | POA: Diagnosis present

## 2015-09-24 DIAGNOSIS — A419 Sepsis, unspecified organism: Secondary | ICD-10-CM | POA: Diagnosis present

## 2015-09-24 DIAGNOSIS — L89210 Pressure ulcer of right hip, unstageable: Secondary | ICD-10-CM | POA: Diagnosis present

## 2015-09-24 DIAGNOSIS — Y95 Nosocomial condition: Secondary | ICD-10-CM | POA: Diagnosis present

## 2015-09-24 DIAGNOSIS — R339 Retention of urine, unspecified: Secondary | ICD-10-CM | POA: Diagnosis present

## 2015-09-24 DIAGNOSIS — Z885 Allergy status to narcotic agent status: Secondary | ICD-10-CM | POA: Diagnosis not present

## 2015-09-24 DIAGNOSIS — L89159 Pressure ulcer of sacral region, unspecified stage: Secondary | ICD-10-CM | POA: Diagnosis present

## 2015-09-24 DIAGNOSIS — E039 Hypothyroidism, unspecified: Secondary | ICD-10-CM | POA: Diagnosis present

## 2015-09-24 DIAGNOSIS — G629 Polyneuropathy, unspecified: Secondary | ICD-10-CM | POA: Diagnosis present

## 2015-09-24 DIAGNOSIS — L89519 Pressure ulcer of right ankle, unspecified stage: Secondary | ICD-10-CM | POA: Diagnosis present

## 2015-09-24 DIAGNOSIS — K219 Gastro-esophageal reflux disease without esophagitis: Secondary | ICD-10-CM | POA: Diagnosis present

## 2015-09-24 LAB — CBC WITH DIFFERENTIAL/PLATELET
Basophils Absolute: 0.1 10*3/uL (ref 0–0.1)
Basophils Relative: 0 %
EOS PCT: 4 %
Eosinophils Absolute: 0.5 10*3/uL (ref 0–0.7)
HCT: 30.8 % — ABNORMAL LOW (ref 35.0–47.0)
Hemoglobin: 9.7 g/dL — ABNORMAL LOW (ref 12.0–16.0)
LYMPHS ABS: 0.5 10*3/uL — AB (ref 1.0–3.6)
LYMPHS PCT: 4 %
MCH: 28.4 pg (ref 26.0–34.0)
MCHC: 31.7 g/dL — ABNORMAL LOW (ref 32.0–36.0)
MCV: 89.8 fL (ref 80.0–100.0)
MONO ABS: 0.5 10*3/uL (ref 0.2–0.9)
MONOS PCT: 4 %
Neutro Abs: 11.1 10*3/uL — ABNORMAL HIGH (ref 1.4–6.5)
Neutrophils Relative %: 88 %
PLATELETS: 216 10*3/uL (ref 150–440)
RBC: 3.42 MIL/uL — ABNORMAL LOW (ref 3.80–5.20)
RDW: 20.2 % — AB (ref 11.5–14.5)
WBC: 12.6 10*3/uL — ABNORMAL HIGH (ref 3.6–11.0)

## 2015-09-24 LAB — URINALYSIS COMPLETE WITH MICROSCOPIC (ARMC ONLY)
BACTERIA UA: NONE SEEN
Bilirubin Urine: NEGATIVE
Glucose, UA: NEGATIVE mg/dL
HGB URINE DIPSTICK: NEGATIVE
Ketones, ur: NEGATIVE mg/dL
LEUKOCYTES UA: NEGATIVE
NITRITE: NEGATIVE
PH: 6 (ref 5.0–8.0)
PROTEIN: NEGATIVE mg/dL
Specific Gravity, Urine: 1.019 (ref 1.005–1.030)

## 2015-09-24 LAB — EXPECTORATED SPUTUM ASSESSMENT W REFEX TO RESP CULTURE: SPECIAL REQUESTS: NORMAL

## 2015-09-24 LAB — PROTIME-INR
INR: 1.22
Prothrombin Time: 15.6 seconds — ABNORMAL HIGH (ref 11.4–15.0)

## 2015-09-24 LAB — LACTIC ACID, PLASMA
LACTIC ACID, VENOUS: 1.3 mmol/L (ref 0.5–2.0)
LACTIC ACID, VENOUS: 1.5 mmol/L (ref 0.5–2.0)

## 2015-09-24 LAB — APTT: APTT: 37 s — AB (ref 24–36)

## 2015-09-24 LAB — GLUCOSE, CAPILLARY
GLUCOSE-CAPILLARY: 101 mg/dL — AB (ref 65–99)
GLUCOSE-CAPILLARY: 130 mg/dL — AB (ref 65–99)
Glucose-Capillary: 96 mg/dL (ref 65–99)
Glucose-Capillary: 149 mg/dL — ABNORMAL HIGH (ref 65–99)

## 2015-09-24 LAB — BLOOD GAS, ARTERIAL
ACID-BASE DEFICIT: 7 mmol/L — AB (ref 0.0–2.0)
ALLENS TEST (PASS/FAIL): POSITIVE — AB
BICARBONATE: 16.5 meq/L — AB (ref 21.0–28.0)
FIO2: 0.21
O2 SAT: 89.5 %
PATIENT TEMPERATURE: 37
PO2 ART: 57 mmHg — AB (ref 83.0–108.0)
pCO2 arterial: 26 mmHg — ABNORMAL LOW (ref 32.0–48.0)
pH, Arterial: 7.41 (ref 7.350–7.450)

## 2015-09-24 LAB — COMPREHENSIVE METABOLIC PANEL
ALT: 21 U/L (ref 14–54)
AST: 21 U/L (ref 15–41)
Albumin: 3.4 g/dL — ABNORMAL LOW (ref 3.5–5.0)
Alkaline Phosphatase: 224 U/L — ABNORMAL HIGH (ref 38–126)
Anion gap: 7 (ref 5–15)
BUN: 19 mg/dL (ref 6–20)
CHLORIDE: 116 mmol/L — AB (ref 101–111)
CO2: 18 mmol/L — ABNORMAL LOW (ref 22–32)
CREATININE: 0.43 mg/dL — AB (ref 0.44–1.00)
Calcium: 9 mg/dL (ref 8.9–10.3)
Glucose, Bld: 125 mg/dL — ABNORMAL HIGH (ref 65–99)
POTASSIUM: 3.2 mmol/L — AB (ref 3.5–5.1)
Sodium: 141 mmol/L (ref 135–145)
Total Bilirubin: 1.3 mg/dL — ABNORMAL HIGH (ref 0.3–1.2)
Total Protein: 6.1 g/dL — ABNORMAL LOW (ref 6.5–8.1)

## 2015-09-24 LAB — MAGNESIUM: MAGNESIUM: 1.7 mg/dL (ref 1.7–2.4)

## 2015-09-24 LAB — C-REACTIVE PROTEIN: CRP: 15.2 mg/dL — ABNORMAL HIGH (ref <1.0)

## 2015-09-24 LAB — POTASSIUM: POTASSIUM: 3.3 mmol/L — AB (ref 3.5–5.1)

## 2015-09-24 LAB — EXPECTORATED SPUTUM ASSESSMENT W GRAM STAIN, RFLX TO RESP C

## 2015-09-24 LAB — PROCALCITONIN: PROCALCITONIN: 0.31 ng/mL

## 2015-09-24 LAB — LIPASE, BLOOD: LIPASE: 26 U/L (ref 11–51)

## 2015-09-24 MED ORDER — PANTOPRAZOLE SODIUM 40 MG PO TBEC
40.0000 mg | DELAYED_RELEASE_TABLET | Freq: Every day | ORAL | Status: DC
  Administered 2015-09-25 – 2015-09-30 (×6): 40 mg via ORAL
  Filled 2015-09-24 (×7): qty 1

## 2015-09-24 MED ORDER — SODIUM CHLORIDE 0.9 % IV BOLUS (SEPSIS)
1000.0000 mL | Freq: Once | INTRAVENOUS | Status: AC
  Administered 2015-09-24: 1000 mL via INTRAVENOUS

## 2015-09-24 MED ORDER — POTASSIUM CHLORIDE CRYS ER 10 MEQ PO TBCR
10.0000 meq | EXTENDED_RELEASE_TABLET | Freq: Two times a day (BID) | ORAL | Status: DC
  Filled 2015-09-24 (×3): qty 1

## 2015-09-24 MED ORDER — POTASSIUM CHLORIDE 20 MEQ PO PACK
40.0000 meq | PACK | Freq: Two times a day (BID) | ORAL | Status: DC
  Administered 2015-09-24 – 2015-09-30 (×12): 40 meq via ORAL
  Filled 2015-09-24 (×12): qty 2

## 2015-09-24 MED ORDER — DEXTROSE 5 % IV SOLN
1.0000 g | Freq: Every day | INTRAVENOUS | Status: DC
  Administered 2015-09-25: 1 g via INTRAVENOUS
  Filled 2015-09-24 (×3): qty 10

## 2015-09-24 MED ORDER — VANCOMYCIN HCL IN DEXTROSE 750-5 MG/150ML-% IV SOLN
750.0000 mg | Freq: Once | INTRAVENOUS | Status: AC
  Administered 2015-09-24: 750 mg via INTRAVENOUS
  Filled 2015-09-24: qty 150

## 2015-09-24 MED ORDER — SODIUM CHLORIDE 0.9 % IJ SOLN
3.0000 mL | Freq: Two times a day (BID) | INTRAMUSCULAR | Status: DC
  Administered 2015-09-24 – 2015-09-27 (×5): 3 mL via INTRAVENOUS

## 2015-09-24 MED ORDER — SENNOSIDES-DOCUSATE SODIUM 8.6-50 MG PO TABS: 1.0000 | ORAL_TABLET | Freq: Every evening | ORAL | Status: DC | PRN

## 2015-09-24 MED ORDER — IOHEXOL 300 MG/ML  SOLN
75.0000 mL | Freq: Once | INTRAMUSCULAR | Status: AC | PRN
  Administered 2015-09-24: 75 mL via INTRAVENOUS

## 2015-09-24 MED ORDER — SODIUM CHLORIDE 0.9 % IV SOLN
INTRAVENOUS | Status: DC
  Administered 2015-09-24: 01:00:00 via INTRAVENOUS

## 2015-09-24 MED ORDER — GLUCERNA SHAKE PO LIQD
237.0000 mL | Freq: Three times a day (TID) | ORAL | Status: DC
  Administered 2015-09-24 – 2015-09-26 (×5): 237 mL via ORAL

## 2015-09-24 MED ORDER — SODIUM BICARBONATE 8.4 % IV SOLN
INTRAVENOUS | Status: DC
  Administered 2015-09-24: 20:00:00 via INTRAVENOUS
  Filled 2015-09-24 (×2): qty 150

## 2015-09-24 MED ORDER — POLYETHYLENE GLYCOL 3350 17 G PO PACK
17.0000 g | PACK | Freq: Two times a day (BID) | ORAL | Status: DC
  Administered 2015-09-24 – 2015-09-27 (×4): 17 g via ORAL
  Filled 2015-09-24 (×9): qty 1

## 2015-09-24 MED ORDER — GLUCERNA SHAKE PO LIQD: 237.0000 mL | Freq: Two times a day (BID) | ORAL | Status: DC

## 2015-09-24 MED ORDER — FENTANYL 25 MCG/HR TD PT72
25.0000 ug | MEDICATED_PATCH | TRANSDERMAL | Status: DC
  Administered 2015-09-24 – 2015-09-27 (×2): 25 ug via TRANSDERMAL
  Filled 2015-09-24 (×3): qty 1

## 2015-09-24 MED ORDER — ACETAMINOPHEN 650 MG RE SUPP: 650.0000 mg | Freq: Four times a day (QID) | RECTAL | Status: DC | PRN

## 2015-09-24 MED ORDER — OXYCODONE HCL 5 MG PO TABS
5.0000 mg | ORAL_TABLET | ORAL | Status: DC | PRN
  Administered 2015-09-26: 5 mg via ORAL
  Filled 2015-09-24: qty 1

## 2015-09-24 MED ORDER — VANCOMYCIN HCL IN DEXTROSE 750-5 MG/150ML-% IV SOLN
750.0000 mg | INTRAVENOUS | Status: DC
  Administered 2015-09-24 – 2015-09-26 (×3): 750 mg via INTRAVENOUS
  Filled 2015-09-24 (×4): qty 150

## 2015-09-24 MED ORDER — SODIUM CHLORIDE 0.9 % IV BOLUS (SEPSIS)
500.0000 mL | INTRAVENOUS | Status: AC
  Administered 2015-09-24: 500 mL via INTRAVENOUS

## 2015-09-24 MED ORDER — ALPRAZOLAM 0.25 MG PO TABS
0.2500 mg | ORAL_TABLET | Freq: Two times a day (BID) | ORAL | Status: DC
  Administered 2015-09-24 – 2015-09-26 (×5): 0.25 mg via ORAL
  Filled 2015-09-24 (×6): qty 1

## 2015-09-24 MED ORDER — IOHEXOL 240 MG/ML SOLN
25.0000 mL | INTRAMUSCULAR | Status: AC
  Administered 2015-09-24 (×2): 25 mL via ORAL

## 2015-09-24 MED ORDER — PIPERACILLIN-TAZOBACTAM 3.375 G IVPB
3.3750 g | Freq: Three times a day (TID) | INTRAVENOUS | Status: DC
  Administered 2015-09-24: 3.375 g via INTRAVENOUS
  Filled 2015-09-24 (×4): qty 50

## 2015-09-24 MED ORDER — KCL IN DEXTROSE-NACL 20-5-0.45 MEQ/L-%-% IV SOLN
INTRAVENOUS | Status: DC
  Administered 2015-09-24: 13:00:00 via INTRAVENOUS
  Filled 2015-09-24 (×7): qty 1000

## 2015-09-24 MED ORDER — ACETAMINOPHEN 325 MG PO TABS
650.0000 mg | ORAL_TABLET | Freq: Four times a day (QID) | ORAL | Status: DC | PRN
Start: 2015-09-24 — End: 2015-09-30
  Administered 2015-09-25: 650 mg via ORAL
  Filled 2015-09-24: qty 2

## 2015-09-24 MED ORDER — SODIUM CHLORIDE 0.9 % IV BOLUS (SEPSIS)
500.0000 mL | Freq: Once | INTRAVENOUS | Status: AC
  Administered 2015-09-24: 500 mL via INTRAVENOUS

## 2015-09-24 MED ORDER — ONDANSETRON HCL 4 MG PO TABS: 4.0000 mg | ORAL_TABLET | Freq: Three times a day (TID) | ORAL | Status: DC | PRN

## 2015-09-24 MED ORDER — MEGESTROL ACETATE 40 MG PO TABS
40.0000 mg | ORAL_TABLET | Freq: Every day | ORAL | Status: DC
  Administered 2015-09-25 – 2015-09-30 (×6): 40 mg via ORAL
  Filled 2015-09-24 (×7): qty 1

## 2015-09-24 MED ORDER — ENOXAPARIN SODIUM 40 MG/0.4ML ~~LOC~~ SOLN
40.0000 mg | SUBCUTANEOUS | Status: DC
  Administered 2015-09-24 – 2015-09-29 (×6): 40 mg via SUBCUTANEOUS
  Filled 2015-09-24 (×6): qty 0.4

## 2015-09-24 MED ORDER — MAGNESIUM OXIDE 400 (241.3 MG) MG PO TABS
400.0000 mg | ORAL_TABLET | Freq: Two times a day (BID) | ORAL | Status: DC
  Administered 2015-09-24 – 2015-09-30 (×13): 400 mg via ORAL
  Filled 2015-09-24 (×14): qty 1

## 2015-09-24 MED ORDER — MORPHINE SULFATE ER 30 MG PO TBCR
30.0000 mg | EXTENDED_RELEASE_TABLET | Freq: Two times a day (BID) | ORAL | Status: DC
  Administered 2015-09-24 – 2015-09-30 (×12): 30 mg via ORAL
  Filled 2015-09-24 (×15): qty 1

## 2015-09-24 NOTE — Progress Notes (Signed)
Soap suds enema given. Patient tolerated well.

## 2015-09-24 NOTE — Progress Notes (Addendum)
[facility] NAME: [name]    MR#:  [id number]  DATE OF BIRTH:  Apr 13, 1946  SUBJECTIVE:  CHIEF COMPLAINT:   Chief Complaint  Patient presents with  . Fatigue   patient is 70 year old Caucasian female who presents to the hospital with complaints of weakness, lethargy. On arrival to emergency room, she was noted to have elevated white blood cell count also hypotension was felt to have sepsis and was admitted to the hospital for broad-spectrum antibiotic therapy. She seemed to be a little bit more alert today, however, still her review of system is not available due to significant somnolence. Patient has been coughing and expectorating yellowish sputum, sputum culture is taken, blood cultures are pending  ROS  VITAL SIGNS: Blood pressure 81/61, pulse 92, temperature 98.2 F (36.8 C), temperature source Oral, resp. rate 17, height 5\' 6"  (1.676 m), weight 43.092 kg (95 lb), SpO2 91 %.  PHYSICAL EXAMINATION:   GENERAL:  70 y.o.-year-old patient lying in the bed with no acute distress. Very somnolent, sleepy and malnourished with decreased muscle mass, chronic sick appearance EYES: Pupils equal, round, reactive to light and accommodation. No scleral icterus. Extraocular muscles intact.  HEENT: Head atraumatic, normocephalic. Oropharynx and nasopharynx clear.  NECK:  Supple, no jugular venous distention. No thyroid enlargement, no tenderness.  LUNGS: Some diminished breath sounds bilaterally, no wheezing, rales,rhonchi or crepitation. No use of accessory muscles of respiration. Intermittent cough with rattling in the chest from the distance CARDIOVASCULAR: S1, S2 normal. No murmurs, rubs, or gallops.  ABDOMEN: Soft, tender in left lower quadrant with some voluntary guarding, nondistended. Bowel sounds present. No organomegaly or mass.  EXTREMITIES: No pedal edema, cyanosis, or clubbing.  NEUROLOGIC: Cranial nerves II through XII are  intact. Muscle strength 5/5 in all extremities. Sensation intact. Gait not checked.  PSYCHIATRIC: The patient is alert and oriented x 3.  SKIN: No obvious rash, lesion, or ulcer.   ORDERS/RESULTS REVIEWED:   CBC  Recent Labs Lab 09/23/15 2233 09/24/15 0432  WBC 17.9* 12.6*  HGB 10.3* 9.7*  HCT 31.3* 30.8*  PLT 240 216  MCV 89.9 89.8  MCH 29.6 28.4  MCHC 32.9 31.7*  RDW 20.6* 20.2*  LYMPHSABS  --  0.5*  MONOABS  --  0.5  EOSABS  --  0.5  BASOSABS  --  0.1   ------------------------------------------------------------------------------------------------------------------  Chemistries   Recent Labs Lab 09/23/15 2233 09/24/15 0432 09/24/15 1546  NA 139 141  --   K 3.7 3.2* 3.3*  CL 113* 116*  --   CO2 20* 18*  --   GLUCOSE 126* 125*  --   BUN 22* 19  --   CREATININE 0.64 0.43*  --   CALCIUM 10.0 9.0  --   MG  --  1.7  --   AST 26 21  --   ALT 27 21  --   ALKPHOS 266* 224*  --   BILITOT 1.1 1.3*  --    ------------------------------------------------------------------------------------------------------------------ estimated creatinine clearance is 45.8 mL/min (by C-G formula based on Cr of 0.43). ------------------------------------------------------------------------------------------------------------------ No results for input(s): TSH, T4TOTAL, T3FREE, THYROIDAB in the last 72 hours.  Invalid input(s): FREET3  Cardiac Enzymes  Recent Labs Lab 09/23/15 2233  TROPONINI <0.03   ------------------------------------------------------------------------------------------------------------------ Invalid input(s): POCBNP ---------------------------------------------------------------------------------------------------------------  RADIOLOGY: Ct Abdomen Pelvis W Contrast  09/24/2015  CLINICAL DATA:  Decubitus ulcer. Sepsis. Elevated white blood cell count and bilirubin. EXAM: CT ABDOMEN, AND PELVIS WITH CONTRAST TECHNIQUE:  Multidetector CT imaging of  theabdomen and pelvis was performed following the standard protocol during bolus administration of intravenous contrast. CONTRAST:  3mL OMNIPAQUE IOHEXOL 300 MG/ML  SOLN COMPARISON:  CT 09/10/2009, 09/11/2015. FINDINGS: CT ABDOMEN AND PELVIS FINDINGS Lower chest: Mild branching nodular airspace disease at the RIGHT lung base with small effusion. Hepatobiliary: Moderate intrahepatic and extrahepatic biliary duct dilatation. Patient status post cholecystectomy. The degree of dilatation is increased from 2010. The common bile duct measures 9 mm. Dilatation to the ampullary level. Pancreas: The pancreatic duct is dilated to 7 mm. There is atrophy of the pancreatic parenchyma. Potential ductus divisum anatomy. Spleen: Normal spleen Adrenals/urinary tract: Adrenal glands and kidneys are normal. The ureters and bladder normal. The bladder is distended. Stomach/Bowel: Stomach, small bowel, appendix, and cecum are normal. Moderate volume stool throughout the colon. Large volume stool in the rectosigmoid colon due to the rectum. Vascular/Lymphatic: Abdominal aorta is normal caliber with atherosclerotic calcification. There is no retroperitoneal or periportal lymphadenopathy. No pelvic lymphadenopathy. Reproductive: Uterus and ovaries are normal. Other: No free fluid or abscess in the abdomen pelvis. Musculoskeletal: No osseous erosion to suggest acute osteomyelitis. Small skin ulceration posterior to the inferior sacrum on image 70, series 2. No subcutaneous abscess identified. IMPRESSION: 1. Linear nodular airspace disease in the RIGHT lower lobe suggesting pneumonia or aspiration pneumonitis. 2. Biliary Dilatation involving the intrahepatic and extrahepatic ducts and common bile duct up to the ampulla. No pancreatic mass identified. With elevated bilirubin, patient may benefit from ERCP. MRCP may help evaluate the distal common bile duct and pancreatic head however acutely ill patients are often poor candidates for MRCP  due to inability to hold breath for the extended sequences. 3. Dilatation of the pancreatic duct is progressed from 2010. Potential ductus divisum anatomy. Recommend evaluation with MRCP / ERCP as above. 4. Bladder distension and moderate distal stool volume. Recommend clinical correlation. Electronically Signed   By: Suzy Bouchard M.D.   On: 09/24/2015 15:42   Dg Chest Portable 1 View  09/24/2015  CLINICAL DATA:  Lethargy.  Sepsis. EXAM: PORTABLE CHEST 1 VIEW COMPARISON:  09/10/2015 FINDINGS: The cardiomediastinal contours are normal for degree of patient rotation. Improved right basilar aeration with minimal residual atelectasis. No confluent airspace disease. No pleural effusion or pneumothorax. Bones are under mineralized. Multiple old left-sided rib fractures. Postsurgical change in the right proximal humerus. IMPRESSION: No active disease. Electronically Signed   By: Jeb Levering M.D.   On: 09/24/2015 00:13    EKG:  Orders placed or performed during the hospital encounter of 09/23/15  . ED EKG  . ED EKG  . EKG 12-Lead  . EKG 12-Lead    ASSESSMENT AND PLAN:  Active Problems:   Sepsis (HCC)   Dehydration  1. Sepsis likely due to pneumonia, questionable wound infection,  continue patient on broad-spectrum antibiotic therapy, awaiting for sputum cultures, blood cultures are pending as well 2. Pneumonia, suspected aspiration pneumonitis, patient should be kept nothing by mouth while poorly responsive, speech therapist evaluation is requested 3. Biliary dilatation, get MRCP, if positive for biliary stone. We'll get GI involved 4. Hypokalemia, getting magnesium level and supplement intravenously if needed 5. Leukocytosis. Follow with therapy 6. Acidosis on BMP, get ABG, initiate bicarbonate if needed 7. Acute urinary retention on CT scan Place Foley catheter 8. Constipation, enema Management plans discussed with the patient, family and they are in agreement.   DRUG ALLERGIES:   Allergies  Allergen Reactions  . Boniva [Ibandronic Acid] Other (See Comments)  Pain neuropathy worse  . Codeine Other (See Comments)    Nervous/hyper  . Erythromycin Other (See Comments)    "Liver damage"  . Levaquin [Levofloxacin In D5w] Other (See Comments)    Hallucinations     CODE STATUS:     Code Status Orders        Start     Ordered   09/24/15 0051  Full code   Continuous     09/24/15 0051    Code Status History    Date Active Date Inactive Code Status Order ID Comments User Context   09/09/2015  6:01 AM 09/16/2015 10:28 PM Full Code [id number]  Saundra Shelling, MD ED   04/24/2015 11:39 AM 04/26/2015  1:42 PM Full Code [id number]  Corky Mull, MD Inpatient      TOTAL critiocal care TIME TAKING CARE OF THIS PATIENT: 50 minutes.  Prolonged discussion today about patient's condition, treatment plan and discharge planning with patient's husband. Time spent approximately 15 minutes. On discussion  Sherley Leser M.D on 09/24/2015 at 4:29 PM  Between 7am to 6pm - Pager - [phone]  After 6pm go to [email or web address] - password [email or web address] [facility]  [facility]  Office  [phone]  CC: Primary care physician; [name], MD

## 2015-09-24 NOTE — Progress Notes (Addendum)
Initial Nutrition Assessment   INTERVENTION:   Meals and Snacks: Cater to patient preferences; await diet progression Medical Food Supplement Therapy: continue Glucerna as ordered; will increase to TID; Glucerna Shake po TID, each supplement provides 220 kcal and 10 grams of protein. Pt may benefit from Ensure for added kcals and protein. Will add Magic Cup BID for added nutrition. Coordination of Care: will recommend collecting new weight  NUTRITION DIAGNOSIS:   Increased nutrient needs related to wound healing, poor appetite, acute illness as evidenced by estimated needs.  GOAL:   Patient will meet greater than or equal to 90% of their needs  MONITOR:    (Energy Intake, Anthropometrics, Digestive system, Electrolyte and Renal Profile, Skin)  REASON FOR ASSESSMENT:   Malnutrition Screening Tool    ASSESSMENT:   Pt admitted with sepsis secondary to infected decubitus ulcer and/or pna, possibly aspiration pna. Pt also with biliary dilation, possible MRCP scheduled. Per MD note pt with lethargy and poor po intake PTA.  Per MD note, pt s/p shoulder fracture repair in August 2016; since pt has been largely bedbound, developing pressure ulcers. Pt being taken to CT at end of RD visit today.   Past Medical History  Diagnosis Date  . Diabetes mellitus without complication ([place])   . Hypertension   . Neuropathy ([place])   . GERD (gastroesophageal reflux disease)   . Hypothyroidism   . Breast cancer ([place])     breast-left  . Bed sore on heel   . Bed sore on elbow      Diet Order:  Diet full liquid Room service appropriate?: Yes; Fluid consistency:: Thin    Current Nutrition: Pt did not eat lunch as she was scheduled for CT.   Food/Nutrition-Related History: Per husband pt has been home since last admission and of the last 7 days, 6 of them she was eating very well, 3 meals per day. However she did not eat hardly anything the last day PTA yesterday per report.    Scheduled  Medications:  . ALPRAZolam  0.25 mg Oral BID  . cefTRIAXone (ROCEPHIN)  IV  1 g Intravenous Daily  . enoxaparin (LOVENOX) injection  40 mg Subcutaneous Q24H  . feeding supplement (GLUCERNA SHAKE)  237 mL Oral TID WC  . fentaNYL  25 mcg Transdermal Q72H  . magnesium oxide  400 mg Oral BID  . megestrol  40 mg Oral Daily  . morphine  30 mg Oral Q12H  . pantoprazole  40 mg Oral Daily  . polyethylene glycol  17 g Oral BID  . potassium chloride  10 mEq Oral BID  . sodium chloride  3 mL Intravenous Q12H  . vancomycin  750 mg Intravenous Q24H    Continuous Medications:  . dextrose 5 % and 0.45 % NaCl with KCl 20 mEq/L 150 mL/hr at 09/24/15 1246   D5 providing 612kcals in 24 hours   Electrolyte/Renal Profile and Glucose Profile:   Recent Labs Lab 09/23/15 2233 09/24/15 0432 09/24/15 1546  NA 139 141  --   K 3.7 3.2* 3.3*  CL 113* 116*  --   CO2 20* 18*  --   BUN 22* 19  --   CREATININE 0.64 0.43*  --   CALCIUM 10.0 9.0  --   MG  --  1.7  --   GLUCOSE 126* 125*  --    Protein Profile:   Recent Labs Lab 09/23/15 2233 09/24/15 0432  ALBUMIN 3.8 3.4*    Gastrointestinal Profile: Last BM:  09/23/2015   Nutrition-Focused Physical Exam Findings:  Unable to complete Nutrition-Focused physical exam at this time as pt being taken to CT.   Weight Change: Per pt's husband, pt has lost 15-20lbs "conservatively" since her shoulder surgery in August 2016; Per [facility] weight encounters 24lbs lost since August (20% weight loss in 4 months)    Skin:   Multiple unstageable pressure ulcers    Height:   Ht Readings from Last 1 Encounters:  09/23/15 5\' 6"  (1.676 m)    Weight:   Wt Readings from Last 1 Encounters:  09/23/15 95 lb (43.092 kg)    Wt Readings from Last 10 Encounters:  09/23/15 95 lb (43.092 kg)  09/12/15 94 lb (42.638 kg)  04/24/15 119 lb (53.978 kg)  04/17/15 119 lb (53.978 kg)  01/21/15 119 lb (53.978 kg)    Ideal Body Weight:   59kg  BMI:  Body mass  index is 15.34 kg/(m^2).  Estimated Nutritional Needs:   Kcal:  1636-1909kcals, BEE: 1137 kcals, TEE: (IF 1.2-1.4)(AF 1.2) using IBW of 59kg  Protein:  70-88g protein (1.2-1.5g/kg)  Fluid:  1475-1743mL of fluid (25-48mL/kg)  EDUCATION NEEDS:   No education needs identified at this time   [place], RD, LDN Pager [phone] Weekend/On-Call Pager [phone]

## 2015-09-24 NOTE — Consult Note (Signed)
[facility] Infectious Disease     Reason for Consult:  Sepsis   Referring Physician: Seth Bake Date of Admission:  09/23/2015   Active Problems:   Sepsis ([place])   Dehydration   HPI: [name] is a 70 y.o. female with a history of diabetes mellitus diet controlled, hypertension, sever peripheral neuropathy leading to need for walker for last 10 years who has had a decline over the last 10 motnhs following a fall and shoulder fx in May 2016. Had surgical repair in August. Has never recovered, largely bedbound, developing decubitus ulcers.  She was admitted last month with possible sepsis, severe back pain and thoracic decub ulcer with superficial cx growing MSSA. She was treated initially with IV abx then dced on augmentin. ESR and CRP were nml at that time and CT did not show osteomyelitis although could not lay flat for MRI.  She was readmitted with lethargy and confusion. WBC on admit was 17, not febrile.  Her decub ulcers are managed at wound care center.  Saw Dr Con Memos last week and told per husband wounds look stable Still with back pain as well.    Past Medical History  Diagnosis Date  . Diabetes mellitus without complication ([place])   . Hypertension   . Neuropathy ([place])   . GERD (gastroesophageal reflux disease)   . Hypothyroidism   . Breast cancer ([place])     breast-left  . Bed sore on heel   . Bed sore on elbow    Past Surgical History  Procedure Laterality Date  . Cholecystectomy    . Breast lumpectomy Left   . Orif humerus fracture Right 04/24/2015    Procedure: OPEN REDUCTION INTERNAL FIXATION (ORIF) PROXIMAL HUMERUS FRACTURE;  Surgeon: Corky Mull, MD;  Location: [facility] ORS;  Service: Orthopedics;  Laterality: Right;  . Esophagogastroduodenoscopy N/A 09/12/2015    Procedure: ESOPHAGOGASTRODUODENOSCOPY (EGD);  Surgeon: Manya Silvas, MD;  Location: [facility] ENDOSCOPY;  Service: Endoscopy;  Laterality: N/A;   Social History  Substance Use Topics  . Smoking status:  Current Every Day Smoker -- 1.00 packs/day for 40 years  . Smokeless tobacco: None  . Alcohol Use: 0.0 oz/week    0 Standard drinks or equivalent per week     Comment: daily   Family History  Problem Relation Age of Onset  . Breast cancer Mother     Allergies:  Allergies  Allergen Reactions  . Boniva [Ibandronic Acid] Other (See Comments)    Pain neuropathy worse  . Codeine Other (See Comments)    Nervous/hyper  . Erythromycin Other (See Comments)    "Liver damage"  . Levaquin [Levofloxacin In D5w] Other (See Comments)    Hallucinations     Current antibiotics: Antibiotics Given (last 72 hours)    Date/Time Action Medication Dose Rate   09/24/15 0254 Given   vancomycin (VANCOCIN) IVPB 750 mg/150 ml premix 750 mg 150 mL/hr   09/24/15 0254 Given   piperacillin-tazobactam (ZOSYN) IVPB 3.375 g 3.375 g 12.5 mL/hr      MEDICATIONS: . ALPRAZolam  0.25 mg Oral BID  . enoxaparin (LOVENOX) injection  40 mg Subcutaneous Q24H  . feeding supplement (GLUCERNA SHAKE)  237 mL Oral BID BM  . fentaNYL  25 mcg Transdermal Q72H  . magnesium oxide  400 mg Oral BID  . megestrol  40 mg Oral Daily  . morphine  30 mg Oral Q12H  . pantoprazole  40 mg Oral Daily  . piperacillin-tazobactam (ZOSYN)  IV  3.375 g Intravenous  3 times per day  . polyethylene glycol  17 g Oral BID  . potassium chloride  10 mEq Oral BID  . sodium chloride  500 mL Intravenous Once  . sodium chloride  3 mL Intravenous Q12H  . vancomycin  750 mg Intravenous Q24H    Review of Systems - 11 systems reviewed and negative per HPI   OBJECTIVE: Temp:  [96.6 F (35.9 C)-99.5 F (37.5 C)] 99.2 F (37.3 C) (01/11 1136) Pulse Rate:  [95-104] 101 (01/11 1136) Resp:  [10-25] 17 (01/11 1136) BP: (93-121)/(44-66) 103/44 mmHg (01/11 1136) SpO2:  [92 %-100 %] 93 % (01/11 1136) Weight:  [43.092 kg (95 lb)] 43.092 kg (95 lb) (01/10 2219) Physical Exam  Constitutional:  disoreiented but able to converse some. appears very  thin,  HENT: [place]/AT, PERRLA, no scleral icterus Mouth/Throat: Oropharynx is clear and dry . No oropharyngeal exudate.  Cardiovascular: Normal rate, regular rhythm and normal heart sounds.  Pulmonary/Chest: Effort normal and breath sounds normal. No respiratory distress.  has no wheezes.  Neck supple, no nuchal rigidity Abdominal: Soft. Bowel sounds are normal.  exhibits no distension midl diffuse ttp Lymphadenopathy: no cervical adenopathy. No axillary adenopathy Neurological: awake and interactive but not fully oriented Skin: pt was in apin and did not want to tun Psychiatric: a normal mood and affect.  behavior is normal.    LABS: Results for orders placed or performed during the hospital encounter of 09/23/15 (from the past 48 hour(s))  CBC     Status: Abnormal   Collection Time: 09/23/15 10:33 PM  Result Value Ref Range   WBC 17.9 (H) 3.6 - 11.0 K/uL   RBC 3.49 (L) 3.80 - 5.20 MIL/uL   Hemoglobin 10.3 (L) 12.0 - 16.0 g/dL   HCT 31.3 (L) 35.0 - 47.0 %   MCV 89.9 80.0 - 100.0 fL   MCH 29.6 26.0 - 34.0 pg   MCHC 32.9 32.0 - 36.0 g/dL   RDW 20.6 (H) 11.5 - 14.5 %   Platelets 240 150 - 440 K/uL  Comprehensive metabolic panel     Status: Abnormal   Collection Time: 09/23/15 10:33 PM  Result Value Ref Range   Sodium 139 135 - 145 mmol/L   Potassium 3.7 3.5 - 5.1 mmol/L   Chloride 113 (H) 101 - 111 mmol/L   CO2 20 (L) 22 - 32 mmol/L   Glucose, Bld 126 (H) 65 - 99 mg/dL   BUN 22 (H) 6 - 20 mg/dL   Creatinine, Ser 0.64 0.44 - 1.00 mg/dL   Calcium 10.0 8.9 - 10.3 mg/dL   Total Protein 6.7 6.5 - 8.1 g/dL   Albumin 3.8 3.5 - 5.0 g/dL   AST 26 15 - 41 U/L   ALT 27 14 - 54 U/L   Alkaline Phosphatase 266 (H) 38 - 126 U/L   Total Bilirubin 1.1 0.3 - 1.2 mg/dL   GFR calc non Af Amer >60 >60 mL/min   GFR calc Af Amer >60 >60 mL/min    Comment: (NOTE) The eGFR has been calculated using the CKD EPI equation. This calculation has not been validated in all clinical situations. eGFR's  persistently <60 mL/min signify possible Chronic Kidney Disease.    Anion gap 6 5 - 15  Troponin I     Status: None   Collection Time: 09/23/15 10:33 PM  Result Value Ref Range   Troponin I <0.03 <0.031 ng/mL    Comment:        NO INDICATION OF MYOCARDIAL  INJURY.   Lipase, blood     Status: None   Collection Time: 09/23/15 10:33 PM  Result Value Ref Range   Lipase 26 11 - 51 U/L  Urinalysis complete, with microscopic ([facility] only)     Status: Abnormal   Collection Time: 09/23/15 11:38 PM  Result Value Ref Range   Color, Urine YELLOW (A) YELLOW   APPearance HAZY (A) CLEAR   Glucose, UA NEGATIVE NEGATIVE mg/dL   Bilirubin Urine NEGATIVE NEGATIVE   Ketones, ur NEGATIVE NEGATIVE mg/dL   Specific Gravity, Urine 1.019 1.005 - 1.030   Hgb urine dipstick NEGATIVE NEGATIVE   pH 6.0 5.0 - 8.0   Protein, ur NEGATIVE NEGATIVE mg/dL   Nitrite NEGATIVE NEGATIVE   Leukocytes, UA NEGATIVE NEGATIVE   RBC / HPF 0-5 0 - 5 RBC/hpf   WBC, UA 0-5 0 - 5 WBC/hpf   Bacteria, UA NONE SEEN NONE SEEN   Squamous Epithelial / LPF 6-30 (A) NONE SEEN  Lactic acid, plasma     Status: None   Collection Time: 09/23/15 11:38 PM  Result Value Ref Range   Lactic Acid, Venous 1.5 0.5 - 2.0 mmol/L  CBC with Differential     Status: Abnormal   Collection Time: 09/24/15  4:32 AM  Result Value Ref Range   WBC 12.6 (H) 3.6 - 11.0 K/uL   RBC 3.42 (L) 3.80 - 5.20 MIL/uL   Hemoglobin 9.7 (L) 12.0 - 16.0 g/dL   HCT 30.8 (L) 35.0 - 47.0 %   MCV 89.8 80.0 - 100.0 fL   MCH 28.4 26.0 - 34.0 pg   MCHC 31.7 (L) 32.0 - 36.0 g/dL   RDW 20.2 (H) 11.5 - 14.5 %   Platelets 216 150 - 440 K/uL   Neutrophils Relative % 88 %   Neutro Abs 11.1 (H) 1.4 - 6.5 K/uL   Lymphocytes Relative 4 %   Lymphs Abs 0.5 (L) 1.0 - 3.6 K/uL   Monocytes Relative 4 %   Monocytes Absolute 0.5 0.2 - 0.9 K/uL   Eosinophils Relative 4 %   Eosinophils Absolute 0.5 0 - 0.7 K/uL   Basophils Relative 0 %   Basophils Absolute 0.1 0 - 0.1 K/uL   Comprehensive metabolic panel     Status: Abnormal   Collection Time: 09/24/15  4:32 AM  Result Value Ref Range   Sodium 141 135 - 145 mmol/L   Potassium 3.2 (L) 3.5 - 5.1 mmol/L   Chloride 116 (H) 101 - 111 mmol/L   CO2 18 (L) 22 - 32 mmol/L   Glucose, Bld 125 (H) 65 - 99 mg/dL   BUN 19 6 - 20 mg/dL   Creatinine, Ser 0.43 (L) 0.44 - 1.00 mg/dL   Calcium 9.0 8.9 - 10.3 mg/dL   Total Protein 6.1 (L) 6.5 - 8.1 g/dL   Albumin 3.4 (L) 3.5 - 5.0 g/dL   AST 21 15 - 41 U/L   ALT 21 14 - 54 U/L   Alkaline Phosphatase 224 (H) 38 - 126 U/L   Total Bilirubin 1.3 (H) 0.3 - 1.2 mg/dL   GFR calc non Af Amer >60 >60 mL/min   GFR calc Af Amer >60 >60 mL/min    Comment: (NOTE) The eGFR has been calculated using the CKD EPI equation. This calculation has not been validated in all clinical situations. eGFR's persistently <60 mL/min signify possible Chronic Kidney Disease.    Anion gap 7 5 - 15  Lactic acid, plasma     Status: None  Collection Time: 09/24/15  4:32 AM  Result Value Ref Range   Lactic Acid, Venous 1.3 0.5 - 2.0 mmol/L  Procalcitonin     Status: None   Collection Time: 09/24/15  4:32 AM  Result Value Ref Range   Procalcitonin 0.31 ng/mL    Comment:        Interpretation: PCT (Procalcitonin) <= 0.5 ng/mL: Systemic infection (sepsis) is not likely. Local bacterial infection is possible. (NOTE)         ICU PCT Algorithm               Non ICU PCT Algorithm    ----------------------------     ------------------------------         PCT < 0.25 ng/mL                 PCT < 0.1 ng/mL     Stopping of antibiotics            Stopping of antibiotics       strongly encouraged.               strongly encouraged.    ----------------------------     ------------------------------       PCT level decrease by               PCT < 0.25 ng/mL       >= 80% from peak PCT       OR PCT 0.25 - 0.5 ng/mL          Stopping of antibiotics                                             encouraged.      Stopping of antibiotics           encouraged.    ----------------------------     ------------------------------       PCT level decrease by              PCT >= 0.25 ng/mL       < 80% from peak PCT        AND PCT >= 0.5 ng/mL            Continuin g antibiotics                                              encouraged.       Continuing antibiotics            encouraged.    ----------------------------     ------------------------------     PCT level increase compared          PCT > 0.5 ng/mL         with peak PCT AND          PCT >= 0.5 ng/mL             Escalation of antibiotics                                          strongly encouraged.      Escalation of antibiotics        strongly encouraged.   Protime-INR  Status: Abnormal   Collection Time: 09/24/15  4:32 AM  Result Value Ref Range   Prothrombin Time 15.6 (H) 11.4 - 15.0 seconds   INR 1.22   APTT     Status: Abnormal   Collection Time: 09/24/15  4:32 AM  Result Value Ref Range   aPTT 37 (H) 24 - 36 seconds    Comment:        IF BASELINE aPTT IS ELEVATED, SUGGEST PATIENT RISK ASSESSMENT BE USED TO DETERMINE APPROPRIATE ANTICOAGULANT THERAPY.   Glucose, capillary     Status: None   Collection Time: 09/24/15  8:20 AM  Result Value Ref Range   Glucose-Capillary 96 65 - 99 mg/dL   Comment 1 Notify RN   Glucose, capillary     Status: Abnormal   Collection Time: 09/24/15 11:39 AM  Result Value Ref Range   Glucose-Capillary 101 (H) 65 - 99 mg/dL   Comment 1 Notify RN   Culture, expectorated sputum-assessment     Status: None   Collection Time: 09/24/15  1:00 PM  Result Value Ref Range   Specimen Description EXPECTORATED SPUTUM    Special Requests Normal    Sputum evaluation THIS SPECIMEN IS ACCEPTABLE FOR SPUTUM CULTURE    Report Status 09/24/2015 FINAL    No components found for: ESR, C REACTIVE PROTEIN MICRO: Recent Results (from the past 720 hour(s))  Blood culture (routine x 2)     Status: None   Collection  Time: 09/09/15  4:57 AM  Result Value Ref Range Status   Specimen Description BLOOD BLOOD LEFT FOREARM  Final   Special Requests BOTTLES DRAWN AEROBIC AND ANAEROBIC 5ML  Final   Culture NO GROWTH 5 DAYS  Final   Report Status 09/14/2015 FINAL  Final  Blood culture (routine x 2)     Status: None   Collection Time: 09/09/15  4:57 AM  Result Value Ref Range Status   Specimen Description BLOOD RIGHT ANTECUBITAL  Final   Special Requests BOTTLES DRAWN AEROBIC AND ANAEROBIC 5ML  Final   Culture NO GROWTH 5 DAYS  Final   Report Status 09/14/2015 FINAL  Final  Wound culture     Status: None   Collection Time: 09/12/15  2:49 PM  Result Value Ref Range Status   Specimen Description WOUND  Final   Special Requests Normal  Final   Gram Stain RARE WBC SEEN RARE GRAM POSITIVE RODS   Final   Culture   Final    MODERATE GROWTH STAPHYLOCOCCUS AUREUS WITH NORMAL SKIN FLORA    Report Status 09/15/2015 FINAL  Final   Organism ID, Bacteria STAPHYLOCOCCUS AUREUS  Final      Susceptibility   Staphylococcus aureus - MIC*    CIPROFLOXACIN <=0.5 SENSITIVE Sensitive     ERYTHROMYCIN 0.5 SENSITIVE Sensitive     GENTAMICIN <=0.5 SENSITIVE Sensitive     OXACILLIN 0.5 SENSITIVE Sensitive     TRIMETH/SULFA <=10 SENSITIVE Sensitive     CLINDAMYCIN <=0.25 SENSITIVE Sensitive     CEFOXITIN SCREEN NEGATIVE Sensitive     Inducible Clindamycin NEGATIVE Sensitive     TETRACYCLINE Value in next row Sensitive      SENSITIVE<=1    * MODERATE GROWTH STAPHYLOCOCCUS AUREUS  Culture, expectorated sputum-assessment     Status: None   Collection Time: 09/24/15  1:00 PM  Result Value Ref Range Status   Specimen Description EXPECTORATED SPUTUM  Final   Special Requests Normal  Final   Sputum evaluation THIS SPECIMEN IS ACCEPTABLE FOR SPUTUM CULTURE  Final   Report Status 09/24/2015 FINAL  Final    IMAGING: Ct Head Wo Contrast  09/09/2015  CLINICAL DATA:  Chronic weight loss, status post shoulder surgery, with  ulcerations about the body. Loss of appetite. Initial encounter. EXAM: CT HEAD WITHOUT CONTRAST TECHNIQUE: Contiguous axial images were obtained from the base of the skull through the vertex without intravenous contrast. COMPARISON:  MRI of the brain performed 11/27/2014 FINDINGS: There is no evidence of acute infarction, mass lesion, or intra- or extra-axial hemorrhage on CT. Prominence of the ventricles and sulci reflects moderate cortical volume loss. Cerebellar atrophy is noted. Scattered periventricular and subcortical white matter change likely reflects small vessel ischemic microangiopathy. The brainstem and fourth ventricle are within normal limits. The basal ganglia are unremarkable in appearance. The cerebral hemispheres demonstrate grossly normal gray-white differentiation. No mass effect or midline shift is seen. There is no evidence of fracture; visualized osseous structures are unremarkable in appearance. The visualized portions of the orbits are within normal limits. The paranasal sinuses and mastoid air cells are well-aerated. No significant soft tissue abnormalities are seen. IMPRESSION: 1. No acute intracranial pathology seen on CT. 2. Moderate cortical volume loss and scattered small vessel ischemic microangiopathy. Electronically Signed   By: Garald Balding M.D.   On: 09/09/2015 02:43   Ct Thoracic Spine W Contrast  09/11/2015  CLINICAL DATA:  Probable sepsis from decubitus ulcers.  Back pain. EXAM: CT THORACIC SPINE WITH CONTRAST; CT LUMBAR SPINE WITH CONTRAST TECHNIQUE: Multidetector CT imaging of the thoracic spine was performed during intravenous contrast administration. Multiplanar CT image reconstructions were also generated; Multidetector CT imaging of the lumbar spine was performed with intravenous contrast administration. Multiplanar CT image reconstructions were also generated. CONTRAST:  61m OMNIPAQUE IOHEXOL 300 MG/ML  SOLN COMPARISON:  MRI lumbar spine 12/05/2014. FINDINGS:  THORACIC: No thoracic compression fracture or subluxation. No intraspinal mass lesion is evident. No calcified intervertebral disc herniations of significance. No features suggestive of thoracic discitis or osteomyelitis. RIGHT lower lobe pneumonia with effusion. PICC line. No proximal rib fractures. LUMBAR: Severe L1 compression deformity. This has progressed with regard to loss of height from the prior MR. RIGHT L1 transverse process fracture also seen. Comminuted fracture with a prominent vertical cleft extending from anterior to posterior. Up to 8 mm retropulsion of bone into the canal resulting in severe LEFT greater than RIGHT stenosis. No facet capsule disruption is established, but ligamentous edema on prior MR suggests the possibility of dynamic instability with patient upright. Skeletal osteopenia, but no other compression deformities are seen. No features suggestive of lumbar discitis or osteomyelitis. Edema is noted in the soft tissues overlying the lower sacrum and LEFT buttock, but sacral osteomyelitis is not established. Mild disc space narrowing L1-L2. The other intervertebral disc spaces are fairly well preserved. There is a RIGHT paracentral protrusion at L5-S1. Multifactorial subarticular zone narrowing at L4-5 on the RIGHT related to disc disease in facet arthropathy. Mild lower lumbar facet arthropathy without pars defects is noted. Moderate stool burden. Bladder distention. Progressive pancreatic duct dilatation. This is followed to or just proximal to the ampulla, without dominant obstructive mass. Prominent dorsal duct entering the duodenum, possibly representing pancreas divisum or a variant. Common duct felt to be upper normal but suboptimally evaluated. Findings are favored to represent the sequelae of pancreatitis, possibly related to pancreas divisum. Cholecystectomy. Aortic atherosclerosis. IMPRESSION: Severe L1 compression deformity with retropulsion and near complete loss of height  anteriorly. There is progression of anterior wedging, with 8 mm  of retropulsed bone fragment resulting in severe spinal stenosis. Facet joints remain anatomically aligned, but ligamentous edema between the L1 and L2 spinous processes was observed on prior MR, suggesting the potential for dynamic instability with patient upright. No  features of thoracic or lumbar discitis or osteomyelitis. Edema of the soft tissues overlying the sacrum and LEFT buttock, but sacral osteomyelitis not established. Progressive pancreatic duct dilatation. See discussion above. Electronically Signed   By: Staci Righter M.D.   On: 09/11/2015 15:28   Ct Lumbar Spine W Contrast  09/11/2015  CLINICAL DATA:  Probable sepsis from decubitus ulcers.  Back pain. EXAM: CT THORACIC SPINE WITH CONTRAST; CT LUMBAR SPINE WITH CONTRAST TECHNIQUE: Multidetector CT imaging of the thoracic spine was performed during intravenous contrast administration. Multiplanar CT image reconstructions were also generated; Multidetector CT imaging of the lumbar spine was performed with intravenous contrast administration. Multiplanar CT image reconstructions were also generated. CONTRAST:  48m OMNIPAQUE IOHEXOL 300 MG/ML  SOLN COMPARISON:  MRI lumbar spine 12/05/2014. FINDINGS: THORACIC: No thoracic compression fracture or subluxation. No intraspinal mass lesion is evident. No calcified intervertebral disc herniations of significance. No features suggestive of thoracic discitis or osteomyelitis. RIGHT lower lobe pneumonia with effusion. PICC line. No proximal rib fractures. LUMBAR: Severe L1 compression deformity. This has progressed with regard to loss of height from the prior MR. RIGHT L1 transverse process fracture also seen. Comminuted fracture with a prominent vertical cleft extending from anterior to posterior. Up to 8 mm retropulsion of bone into the canal resulting in severe LEFT greater than RIGHT stenosis. No facet capsule disruption is established, but  ligamentous edema on prior MR suggests the possibility of dynamic instability with patient upright. Skeletal osteopenia, but no other compression deformities are seen. No features suggestive of lumbar discitis or osteomyelitis. Edema is noted in the soft tissues overlying the lower sacrum and LEFT buttock, but sacral osteomyelitis is not established. Mild disc space narrowing L1-L2. The other intervertebral disc spaces are fairly well preserved. There is a RIGHT paracentral protrusion at L5-S1. Multifactorial subarticular zone narrowing at L4-5 on the RIGHT related to disc disease in facet arthropathy. Mild lower lumbar facet arthropathy without pars defects is noted. Moderate stool burden. Bladder distention. Progressive pancreatic duct dilatation. This is followed to or just proximal to the ampulla, without dominant obstructive mass. Prominent dorsal duct entering the duodenum, possibly representing pancreas divisum or a variant. Common duct felt to be upper normal but suboptimally evaluated. Findings are favored to represent the sequelae of pancreatitis, possibly related to pancreas divisum. Cholecystectomy. Aortic atherosclerosis. IMPRESSION: Severe L1 compression deformity with retropulsion and near complete loss of height anteriorly. There is progression of anterior wedging, with 8 mm of retropulsed bone fragment resulting in severe spinal stenosis. Facet joints remain anatomically aligned, but ligamentous edema between the L1 and L2 spinous processes was observed on prior MR, suggesting the potential for dynamic instability with patient upright. No  features of thoracic or lumbar discitis or osteomyelitis. Edema of the soft tissues overlying the sacrum and LEFT buttock, but sacral osteomyelitis not established. Progressive pancreatic duct dilatation. See discussion above. Electronically Signed   By: JStaci RighterM.D.   On: 09/11/2015 15:28   Dg Chest Portable 1 View  09/24/2015  CLINICAL DATA:  Lethargy.   Sepsis. EXAM: PORTABLE CHEST 1 VIEW COMPARISON:  09/10/2015 FINDINGS: The cardiomediastinal contours are normal for degree of patient rotation. Improved right basilar aeration with minimal residual atelectasis. No confluent airspace disease. No pleural effusion or pneumothorax.  Bones are under mineralized. Multiple old left-sided rib fractures. Postsurgical change in the right proximal humerus. IMPRESSION: No active disease. Electronically Signed   By: Jeb Levering M.D.   On: 09/24/2015 00:13   Dg Chest Port 1 View  09/10/2015  CLINICAL DATA:  PICC placement.  Sepsis. EXAM: PORTABLE CHEST 1 VIEW COMPARISON:  Chest x-ray dated 09/08/2009 FINDINGS: Right PICC is been inserted. The tip is 8 cm below the carina at the cavoatrial junction. There is focal linear atelectasis at the right lung base. The lungs are otherwise clear. No effusions. Old left posterior rib fractures. IMPRESSION: PICC tip at the cavoatrial junction. Focal atelectasis at the right lung base. Electronically Signed   By: Lorriane Shire M.D.   On: 09/10/2015 16:21   US Abdomen Limited Ruq  09/12/2015  CLINICAL DATA:  Abnormal liver enzymes EXAM: US ABDOMEN LIMITED - RIGHT UPPER QUADRANT COMPARISON:  CT abdomen September 10, 2009 FINDINGS: Gallbladder: Surgically absent. Common bile duct: Diameter: 1 mm. There is no appreciable extrahepatic biliary duct dilatation. Intrahepatic ducts appear borderline prominent, likely due to postcholecystectomy state. Liver: No focal lesion identified. Within normal limits in parenchymal echogenicity. Incidental note is made of a fairly small right pleural effusion. IMPRESSION: Gallbladder absent. Borderline prominence of the intrahepatic biliary ducts may be due to post cholecystectomy state. There is no common bile duct dilatation. No focal liver lesions are identified. Incidental note is made of a fairly small right pleural effusion. Electronically Signed   By: Lowella Grip III M.D.   On: 09/12/2015  09:02    Assessment:   JELITZA MANNINEN is a 70 y.o. female with a history of diabetes mellitus diet controlled, hypertension, severe peripheral neuropathy leading to need for walker for last 10 years who has had a decline over the last 10 motnhs following a fall and shoulder fx in May 2016. Had surgical repair in August. Has never recovered, largely bedbound, developing decubitus ulcers.  She was admitted last month with possible sepsis, severe back pain and thoracic decub ulcer with superficial cx growing MSSA. She was treated initially with IV abx then dced on augmentin. ESR and CRP were nml at that time and CT did not show osteomyelitis although could not lay flat for MRI.  She was readmitted with lethargy and confusion. WBC on admit was 17, not febrile.  Her decub ulcers are managed at wound care center.  Saw Dr Con Memos last week and told per husband wounds look stableStill with back pain as well.   She seems to have had a general decline, she has lost 15-20 pounds, cannot get oob, has developed multiple decubs. I have reviewed prior MMG/ US breast in April suggesting fu in Oct given some findings of concern - this was not done due to other issues with the patient. She also has had MRI brain showing extensive WM changes.   I am concerned for an underlying malignancy and would suggest work up for this.   Recommendations Await CT abd pelvis Recheck ESR CRP which were nml at last visit - if still nml unlikely to be deep bony infection  Continue vanco Change zosyn to ceftriaxone.  Thank you very much for allowing me to participate in the care of this patient. Please call with questions.   Cheral Marker. [name], MD

## 2015-09-24 NOTE — Progress Notes (Signed)
Pt with increased confusion this shift. Lab notified of abnormal ABG's. Notified MD. New orders for Bicarb and Potassium. Foley cath placed and soap suds enema ordered. Upon reassessment, pt found to be hypotensive at 81/61. Paged MD. Ordered 524ml bolus per Dr. Verdell Carmine. Dr. Ether Griffins to continue with plan for pt tomorrow, see updates. Pt resting with spouse at bedside.

## 2015-09-24 NOTE — H&P (Signed)
[place] [facility] [place] NAME: [name]    MR#:  [id number]  DATE OF BIRTH:  1946/08/26  DATE OF ADMISSION:  09/23/2015  PRIMARY CARE PHYSICIAN: [name], MD   REQUESTING/REFERRING PHYSICIAN:   CHIEF COMPLAINT:   Chief Complaint  Patient presents with  . Fatigue    HISTORY OF PRESENT ILLNESS: [name]  is a 70 y.o. female with a known history of hypertension, diabetes mellitus, neuropathy, GERD, sacral decubitus ulcer, heel ulcer, breast cancer presented to the emergency room with generalized weakness and lethargy. Patient was recently treated at our hospital during the Christmas of 2016 for sepsis from decubitus ulcer infection. She received IV antibiotics and PICC line was placed and she completed a course of antibiotics. Currently she is on oral Keflex antibiotic. Patient was more lethargic today and was not able to get up and move around. Has poor oral intake. Has low-grade fever. No complaints of any vomiting, nausea and  Diarrhea. She was evaluated in the emergency room and was found to have elevated WBC count, low blood pressure and was septic and was given IV antibiotics. Hospitalist service was consulted for further care.  PAST MEDICAL HISTORY:   Past Medical History  Diagnosis Date  . Diabetes mellitus without complication ([place])   . Hypertension   . Neuropathy ([place])   . GERD (gastroesophageal reflux disease)   . Hypothyroidism   . Breast cancer ([place])     breast-left  . Bed sore on heel   . Bed sore on elbow     PAST SURGICAL HISTORY: Past Surgical History  Procedure Laterality Date  . Cholecystectomy    . Breast lumpectomy Left   . Orif humerus fracture Right 04/24/2015    Procedure: OPEN REDUCTION INTERNAL FIXATION (ORIF) PROXIMAL HUMERUS FRACTURE;  Surgeon: Corky Mull, MD;  Location: [facility] ORS;  Service: Orthopedics;  Laterality: Right;  . Esophagogastroduodenoscopy N/A 09/12/2015    Procedure:  ESOPHAGOGASTRODUODENOSCOPY (EGD);  Surgeon: Manya Silvas, MD;  Location: [facility] ENDOSCOPY;  Service: Endoscopy;  Laterality: N/A;    SOCIAL HISTORY:  Social History  Substance Use Topics  . Smoking status: Current Every Day Smoker -- 1.00 packs/day for 40 years  . Smokeless tobacco: Not on file  . Alcohol Use: 0.0 oz/week    0 Standard drinks or equivalent per week     Comment: daily    FAMILY HISTORY:  Family History  Problem Relation Age of Onset  . Breast cancer Mother     DRUG ALLERGIES:  Allergies  Allergen Reactions  . Boniva [Ibandronic Acid] Other (See Comments)    Pain neuropathy worse  . Codeine Other (See Comments)    Nervous/hyper  . Erythromycin Other (See Comments)    "Liver damage"  . Levaquin [Levofloxacin In D5w] Other (See Comments)    Hallucinations     REVIEW OF SYSTEMS:   CONSTITUTIONAL: Low grade fever,Has  fatigue and weakness.  EYES: No blurred or double vision.  EARS, NOSE, AND THROAT: No tinnitus or ear pain.  RESPIRATORY: No cough, shortness of breath, wheezing or hemoptysis.  CARDIOVASCULAR: No chest pain, orthopnea, edema.  GASTROINTESTINAL: No nausea, vomiting, diarrhea or abdominal pain.  GENITOURINARY: No dysuria, hematuria.  ENDOCRINE: No polyuria, nocturia,  HEMATOLOGY: No anemia, easy bruising or bleeding SKIN: Left and right hip decubitus ulcer,right malleolus ulcer,left heel ulcer. MUSCULOSKELETAL: No joint pain or arthritis.Heel ulcers noted.   NEUROLOGIC: No tingling, numbness,has  weakness.  PSYCHIATRY: No anxiety  or depression.   MEDICATIONS AT HOME:  Prior to Admission medications   Medication Sig Start Date End Date Taking? Authorizing Provider  ALPRAZolam (XANAX) 0.25 MG tablet Take 0.25 mg by mouth 2 (two) times daily.   Yes Historical Provider, MD  cephALEXin (KEFLEX) 500 MG capsule Take 1 capsule (500 mg total) by mouth 3 (three) times daily. 09/16/15 09/30/15 Yes Vaughan Basta, MD  feeding supplement,  GLUCERNA SHAKE, (GLUCERNA SHAKE) LIQD Take 237 mLs by mouth 2 (two) times daily between meals. 09/16/15  Yes Vaughan Basta, MD  fexofenadine (ALLEGRA) 180 MG tablet Take 180 mg by mouth daily as needed for allergies.    Yes Historical Provider, MD  gabapentin (NEURONTIN) 400 MG capsule Take 400 mg by mouth 3 (three) times daily.   Yes Historical Provider, MD  lansoprazole (PREVACID) 30 MG capsule Take 30 mg by mouth daily at 12 noon.   Yes Historical Provider, MD  magnesium oxide (MAG-OX) 400 (241.3 Mg) MG tablet Take 1 tablet (400 mg total) by mouth 2 (two) times daily. 09/16/15  Yes Vaughan Basta, MD  megestrol (MEGACE) 40 MG tablet Take 1 tablet (40 mg total) by mouth daily. 09/16/15  Yes Vaughan Basta, MD  mometasone (NASONEX) 50 MCG/ACT nasal spray Place 2 sprays into the nose daily as needed (allergies).    Yes Historical Provider, MD  morphine (MS CONTIN) 30 MG 12 hr tablet Take 1 tablet (30 mg total) by mouth every 12 (twelve) hours. 09/16/15  Yes Vaughan Basta, MD  olopatadine (PATANOL) 0.1 % ophthalmic solution Place 1 drop into both eyes 2 (two) times daily as needed for allergies.    Yes Historical Provider, MD  ondansetron (ZOFRAN) 4 MG tablet Take 1 tablet (4 mg total) by mouth every 8 (eight) hours as needed for nausea. 09/16/15  Yes Vaughan Basta, MD  oxyCODONE (OXY IR/ROXICODONE) 5 MG immediate release tablet Take 1 tablet (5 mg total) by mouth every 4 (four) hours as needed for breakthrough pain. 09/16/15  Yes Vaughan Basta, MD  polyethylene glycol (MIRALAX / GLYCOLAX) packet Take 17 g by mouth 2 (two) times daily. 09/16/15  Yes Vaughan Basta, MD  potassium chloride 20 MEQ TBCR Take 10 mEq by mouth 2 (two) times daily. 09/16/15  Yes Vaughan Basta, MD  senna-docusate (SENOKOT-S) 8.6-50 MG tablet Take 1 tablet by mouth at bedtime as needed for mild constipation. 09/16/15  Yes Vaughan Basta, MD  zaleplon (SONATA) 10 MG capsule Take 10  mg by mouth at bedtime as needed for sleep.   Yes Historical Provider, MD  fentaNYL (DURAGESIC - DOSED MCG/HR) 25 MCG/HR patch Place 1 patch (25 mcg total) onto the skin every 3 (three) days. 09/16/15   Vaughan Basta, MD      PHYSICAL EXAMINATION:   VITAL SIGNS: Blood pressure 95/54, pulse 98, temperature 98.4 F (36.9 C), temperature source Oral, resp. rate 25, height 5\' 6"  (1.676 m), weight 43.092 kg (95 lb), SpO2 99 %.  GENERAL:  70 y.o.-year-old patient lying in the bed with no acute distress.  EYES: Pupils equal, round, reactive to light and accommodation. No scleral icterus. Extraocular muscles intact.  HEENT: Head atraumatic, normocephalic. Oropharynx and nasopharynx clear and dry NECK:  Supple, no jugular venous distention. No thyroid enlargement, no tenderness.  LUNGS: Normal breath sounds bilaterally, no wheezing, rales,rhonchi or crepitation. No use of accessory muscles of respiration.  CARDIOVASCULAR: S1, S2 normal. No murmurs, rubs, or gallops.  ABDOMEN: Soft, nontender, nondistended. Bowel sounds present. No organomegaly or mass.  EXTREMITIES:  No pedal edema, cyanosis, or clubbing.  NEUROLOGIC: Cranial nerves II through XII are intact. Muscle strength 5/5 in all extremities. Sensation intact.  PSYCHIATRIC: The patient is alert and oriented x 3.  SKIN: ulcer over right malleolus Right and left hip decubitus ulcers Ulcer in the low back area Left heel ulcer.  LABORATORY PANEL:   CBC  Recent Labs Lab 09/23/15 2233  WBC 17.9*  HGB 10.3*  HCT 31.3*  PLT 240  MCV 89.9  MCH 29.6  MCHC 32.9  RDW 20.6*   ------------------------------------------------------------------------------------------------------------------  Chemistries   Recent Labs Lab 09/23/15 2233  NA 139  K 3.7  CL 113*  CO2 20*  GLUCOSE 126*  BUN 22*  CREATININE 0.64  CALCIUM 10.0  AST 26  ALT 27  ALKPHOS 266*  BILITOT 1.1    ------------------------------------------------------------------------------------------------------------------ estimated creatinine clearance is 45.8 mL/min (by C-G formula based on Cr of 0.64). ------------------------------------------------------------------------------------------------------------------ No results for input(s): TSH, T4TOTAL, T3FREE, THYROIDAB in the last 72 hours.  Invalid input(s): FREET3   Coagulation profile No results for input(s): INR, PROTIME in the last 168 hours. ------------------------------------------------------------------------------------------------------------------- No results for input(s): DDIMER in the last 72 hours. -------------------------------------------------------------------------------------------------------------------  Cardiac Enzymes  Recent Labs Lab 09/23/15 2233  TROPONINI <0.03   ------------------------------------------------------------------------------------------------------------------ Invalid input(s): POCBNP  ---------------------------------------------------------------------------------------------------------------  Urinalysis    Component Value Date/Time   COLORURINE YELLOW* 09/23/2015 2338   APPEARANCEUR HAZY* 09/23/2015 2338   LABSPEC 1.019 09/23/2015 2338   PHURINE 6.0 09/23/2015 2338   GLUCOSEU NEGATIVE 09/23/2015 2338   HGBUR NEGATIVE 09/23/2015 2338   BILIRUBINUR NEGATIVE 09/23/2015 2338   KETONESUR NEGATIVE 09/23/2015 2338   PROTEINUR NEGATIVE 09/23/2015 2338   NITRITE NEGATIVE 09/23/2015 2338   LEUKOCYTESUR NEGATIVE 09/23/2015 2338     RADIOLOGY: Dg Chest Portable 1 View  09/24/2015  CLINICAL DATA:  Lethargy.  Sepsis. EXAM: PORTABLE CHEST 1 VIEW COMPARISON:  09/10/2015 FINDINGS: The cardiomediastinal contours are normal for degree of patient rotation. Improved right basilar aeration with minimal residual atelectasis. No confluent airspace disease. No pleural effusion or pneumothorax.  Bones are under mineralized. Multiple old left-sided rib fractures. Postsurgical change in the right proximal humerus. IMPRESSION: No active disease. Electronically Signed   By: Jeb Levering M.D.   On: 09/24/2015 00:13    EKG: Orders placed or performed during the hospital encounter of 09/23/15  . ED EKG  . ED EKG  . EKG 12-Lead  . EKG 12-Lead    IMPRESSION AND PLAN: 70 year old female patient with history of hypertension, diabetes mellitus, decubitus ulcer, heel ulcer presented to the emergency room with lethargy, weakness and poor oral intake. Her evaluation in the ER revealed low blood pressure and elevated WBC count. Admitting diagnosis 1. Sepsis secondary to infected decubitus ulcer 2. Leukocytosis 3. Hypotension secondary to sepsis 4. Dehydration 5. Multiple decubitus ulcers Treatment plan Admit patient to medical floor with telemetry monitoring IV fluid based on sepsis protocol Start patient on IV vancomycin and IV Zosyn antibiotics Follow-up cultures Wound care consultation Hold blood pressure medication Supportive care.  All the records are reviewed and case discussed with ED provider. Management plans discussed with the patient, family and they are in agreement.  CODE STATUS:FULL Code Status History    Date Active Date Inactive Code Status Order ID Comments User Context   09/09/2015  6:01 AM 09/16/2015 10:28 PM Full Code [id number]  Saundra Shelling, MD ED   04/24/2015 11:39 AM 04/26/2015  1:42 PM Full Code [id number]  Corky Mull, MD Inpatient  TOTAL TIME TAKING CARE OF THIS PATIENT: 51 minutes.    Saundra Shelling M.D on 09/24/2015 at 12:33 AM  Between 7am to 6pm - Pager - [phone]  After 6pm go to [email or web address] - password [email or web address] [facility]  [facility]  Office  [phone]  CC: Primary care physician; [name], MD

## 2015-09-24 NOTE — Progress Notes (Signed)
ANTIBIOTIC CONSULT NOTE - INITIAL  Pharmacy Consult for vancomycin and Zosyn dosing Indication: sepsis  Allergies  Allergen Reactions  . Boniva [Ibandronic Acid] Other (See Comments)    Pain neuropathy worse  . Codeine Other (See Comments)    Nervous/hyper  . Erythromycin Other (See Comments)    "Liver damage"  . Levaquin [Levofloxacin In D5w] Other (See Comments)    Hallucinations     Patient Measurements: Height: 5\' 6"  (167.6 cm) Weight: 95 lb (43.092 kg) IBW/kg (Calculated) : 59.3 Adjusted Body Weight: 43.1kg  Vital Signs: Temp: 98.4 F (36.9 C) (01/10 2219) Temp Source: Oral (01/10 2219) BP: 106/61 mmHg (01/11 0100) Pulse Rate: 101 (01/11 0100) Intake/Output from previous day:   Intake/Output from this shift:    Labs:  Recent Labs  09/23/15 2233  WBC 17.9*  HGB 10.3*  PLT 240  CREATININE 0.64   Estimated Creatinine Clearance: 45.8 mL/min (by C-G formula based on Cr of 0.64). No results for input([name]): VANCOTROUGH, VANCOPEAK, VANCORANDOM, GENTTROUGH, GENTPEAK, GENTRANDOM, TOBRATROUGH, TOBRAPEAK, TOBRARND, AMIKACINPEAK, AMIKACINTROU, AMIKACIN in the last 72 hours.   Microbiology: Recent Results (from the past 720 hour([name]))  Blood culture (routine x 2)     Status: None   Collection Time: 09/09/15  4:57 AM  Result Value Ref Range Status   Specimen Description BLOOD BLOOD LEFT FOREARM  Final   Special Requests BOTTLES DRAWN AEROBIC AND ANAEROBIC 5ML  Final   Culture NO GROWTH 5 DAYS  Final   Report Status 09/14/2015 FINAL  Final  Blood culture (routine x 2)     Status: None   Collection Time: 09/09/15  4:57 AM  Result Value Ref Range Status   Specimen Description BLOOD RIGHT ANTECUBITAL  Final   Special Requests BOTTLES DRAWN AEROBIC AND ANAEROBIC 5ML  Final   Culture NO GROWTH 5 DAYS  Final   Report Status 09/14/2015 FINAL  Final  Wound culture     Status: None   Collection Time: 09/12/15  2:49 PM  Result Value Ref Range Status   Specimen Description  WOUND  Final   Special Requests Normal  Final   Gram Stain RARE WBC SEEN RARE GRAM POSITIVE RODS   Final   Culture   Final    MODERATE GROWTH STAPHYLOCOCCUS AUREUS WITH NORMAL SKIN FLORA    Report Status 09/15/2015 FINAL  Final   Organism ID, Bacteria STAPHYLOCOCCUS AUREUS  Final      Susceptibility   Staphylococcus aureus - MIC*    CIPROFLOXACIN <=0.5 SENSITIVE Sensitive     ERYTHROMYCIN 0.5 SENSITIVE Sensitive     GENTAMICIN <=0.5 SENSITIVE Sensitive     OXACILLIN 0.5 SENSITIVE Sensitive     TRIMETH/SULFA <=10 SENSITIVE Sensitive     CLINDAMYCIN <=0.25 SENSITIVE Sensitive     CEFOXITIN SCREEN NEGATIVE Sensitive     Inducible Clindamycin NEGATIVE Sensitive     TETRACYCLINE Value in next row Sensitive      SENSITIVE<=1    * MODERATE GROWTH STAPHYLOCOCCUS AUREUS    Medical History: Past Medical History  Diagnosis Date  . Diabetes mellitus without complication ([place])   . Hypertension   . Neuropathy ([place])   . GERD (gastroesophageal reflux disease)   . Hypothyroidism   . Breast cancer ([place])     breast-left  . Bed sore on heel   . Bed sore on elbow     Medications:   Assessment: Blood cx pending  UA: (-)  CXR: no acute disease  Goal of Therapy:  Vancomycin trough level 15-20  mcg/ml  Plan:  TBW 43.1kg  IBW 59.3kg  DW 43.1kg  Vd 30L kei 0.043 hr-1  T1/2 16 hours Vancomycin 750 mg IV q 24 hours ordered with stacked dosing. Level before 5th dose.  Zosyn 3.375 grams q 8 hours ordered.  [name] 09/24/2015,1:33 AM

## 2015-09-24 NOTE — Progress Notes (Signed)
Patient ha been discharged and not available for Adv. Dir. Edu.

## 2015-09-24 NOTE — Care Management Note (Signed)
Case Management Note  Patient Details  Name: MENDE BISWELL MRN: [id number] Date of Birth: [birth year]  Subjective/Objective:                  Met with patient's husband to discuss discharge planning. Patient is sleeping as she was when I saw her a few weeks ago on 2A. Patient recently discharged to home with [facility] RN and PT although husband states she has not worked well with HHPT due to pain.  She uses a rollator to ambulate short distances. ID and Bruce consults pending due to re-current infection and multiple wounds per RN. Husband states that "patient's head has been drooping lately". He states she can ride in a car to home but he felt rushed out of here last admission.  Action/Plan: [facility] Go has been notified of patient admission. Will need orders to resume care and any wound care orders updated prior to discharge. RNCM will continue to follow.   Expected Discharge Date:                  Expected Discharge Plan:     In-House Referral:     Discharge planning Services  CM Consult  Post Acute Care Choice:  Home Health Choice offered to:  Patient  DME Arranged:    DME Agency:     HH Arranged:  PT, RN HH Agency:  [facility]  Status of Service:  In process, will continue to follow  Medicare Important Message Given:    Date Medicare IM Given:    Medicare IM give by:    Date Additional Medicare IM Given:    Additional Medicare Important Message give by:     If discussed at [place] of Stay Meetings, dates discussed:    Additional Comments:  Marshell Garfinkel, RN 09/24/2015, 10:25 AM

## 2015-09-24 NOTE — Progress Notes (Signed)
Speech Therapy Note: received order, reviewed chart notes; consulted NSG re: pt's status. Pt is currently drinking contrast for a scan/test and ID MD is in room evaluating pt. NSG denied any overt s/s of aspiration w/ her current po intake; pt is on a full liquid diet per MD. ST will f/u w/ BSE tomorrow when pt is available. Rec. meds in puree for easier swallowing; aspiration precautions.

## 2015-09-25 ENCOUNTER — Inpatient Hospital Stay: Payer: Medicare Other

## 2015-09-25 LAB — GLUCOSE, CAPILLARY
GLUCOSE-CAPILLARY: 169 mg/dL — AB (ref 65–99)
GLUCOSE-CAPILLARY: 60 mg/dL — AB (ref 65–99)
GLUCOSE-CAPILLARY: 120 mg/dL — AB (ref 65–99)
GLUCOSE-CAPILLARY: 130 mg/dL — AB (ref 65–99)
Glucose-Capillary: 137 mg/dL — ABNORMAL HIGH (ref 65–99)
Glucose-Capillary: 61 mg/dL — ABNORMAL LOW (ref 65–99)
Glucose-Capillary: 121 mg/dL — ABNORMAL HIGH (ref 65–99)
Glucose-Capillary: 140 mg/dL — ABNORMAL HIGH (ref 65–99)
Glucose-Capillary: 105 mg/dL — ABNORMAL HIGH (ref 65–99)

## 2015-09-25 LAB — BASIC METABOLIC PANEL
ANION GAP: 3 — AB (ref 5–15)
BUN: 11 mg/dL (ref 6–20)
CO2: 19 mmol/L — AB (ref 22–32)
CREATININE: 0.46 mg/dL (ref 0.44–1.00)
Calcium: 8.3 mg/dL — ABNORMAL LOW (ref 8.9–10.3)
Chloride: 116 mmol/L — ABNORMAL HIGH (ref 101–111)
Glucose, Bld: 182 mg/dL — ABNORMAL HIGH (ref 65–99)
Potassium: 3.3 mmol/L — ABNORMAL LOW (ref 3.5–5.1)
SODIUM: 138 mmol/L (ref 135–145)

## 2015-09-25 LAB — HEPATIC FUNCTION PANEL
ALBUMIN: 2.6 g/dL — AB (ref 3.5–5.0)
ALK PHOS: 245 U/L — AB (ref 38–126)
ALT: 17 U/L (ref 14–54)
AST: 19 U/L (ref 15–41)
Bilirubin, Direct: 0.3 mg/dL (ref 0.1–0.5)
Indirect Bilirubin: 0.5 mg/dL (ref 0.3–0.9)
TOTAL PROTEIN: 5.1 g/dL — AB (ref 6.5–8.1)
Total Bilirubin: 0.8 mg/dL (ref 0.3–1.2)

## 2015-09-25 LAB — AMMONIA: AMMONIA: 24 umol/L (ref 9–35)

## 2015-09-25 LAB — SEDIMENTATION RATE: SED RATE: 52 mm/h — AB (ref 0–30)

## 2015-09-25 LAB — HEMOGLOBIN A1C: HEMOGLOBIN A1C: 5 % (ref 4.0–6.0)

## 2015-09-25 MED ORDER — PIPERACILLIN-TAZOBACTAM 3.375 G IVPB
3.3750 g | Freq: Three times a day (TID) | INTRAVENOUS | Status: DC
  Administered 2015-09-25 – 2015-09-30 (×12): 3.375 g via INTRAVENOUS
  Filled 2015-09-25 (×16): qty 50

## 2015-09-25 MED ORDER — DEXTROSE 50 % IV SOLN
INTRAVENOUS | Status: AC
  Administered 2015-09-25: 50 mL
  Filled 2015-09-25: qty 50

## 2015-09-25 MED ORDER — INSULIN ASPART 100 UNIT/ML ~~LOC~~ SOLN
3.0000 [IU] | Freq: Three times a day (TID) | SUBCUTANEOUS | Status: DC
  Administered 2015-09-25: 3 [IU] via SUBCUTANEOUS
  Filled 2015-09-25: qty 3

## 2015-09-25 MED ORDER — DEXTROSE 50 % IV SOLN: 50.0000 mL | Freq: Once | INTRAVENOUS | Status: DC

## 2015-09-25 MED ORDER — INSULIN ASPART 100 UNIT/ML ~~LOC~~ SOLN
0.0000 [IU] | Freq: Three times a day (TID) | SUBCUTANEOUS | Status: DC
  Administered 2015-09-25: 1 [IU] via SUBCUTANEOUS
  Filled 2015-09-25: qty 1

## 2015-09-25 MED ORDER — HALOPERIDOL LACTATE 5 MG/ML IJ SOLN: 1.0000 mg | Freq: Four times a day (QID) | INTRAMUSCULAR | Status: DC | PRN

## 2015-09-25 NOTE — Evaluation (Signed)
Clinical/Bedside Swallow Evaluation Patient Details  Name: [name] MRN: [id number] Date of Birth: 02-06-1946  Today's Date: 09/25/2015 Time: SLP Start Time (ACUTE ONLY): 1100 SLP Stop Time (ACUTE ONLY): 1200 SLP Time Calculation (min) (ACUTE ONLY): 60 min  Past Medical History:  Past Medical History  Diagnosis Date  . Diabetes mellitus without complication ([place])   . Hypertension   . Neuropathy ([place])   . GERD (gastroesophageal reflux disease)   . Hypothyroidism   . Breast cancer ([place])     breast-left  . Bed sore on heel   . Bed sore on elbow    Past Surgical History:  Past Surgical History  Procedure Laterality Date  . Cholecystectomy    . Breast lumpectomy Left   . Orif humerus fracture Right 04/24/2015    Procedure: OPEN REDUCTION INTERNAL FIXATION (ORIF) PROXIMAL HUMERUS FRACTURE;  Surgeon: Corky Mull, MD;  Location: [facility] ORS;  Service: Orthopedics;  Laterality: Right;  . Esophagogastroduodenoscopy N/A 09/12/2015    Procedure: ESOPHAGOGASTRODUODENOSCOPY (EGD);  Surgeon: Manya Silvas, MD;  [facility]: [facility] ENDOSCOPY;  Service: Endoscopy;  Laterality: N/A;   HPI:  patient is 70 year old Caucasian female who presents to the hospital with complaints of weakness, lethargy. On arrival to emergency room, she was noted to have elevated white blood cell count also hypotension was felt to have sepsis and was admitted to the hospital for broad-spectrum antibiotic therapy. She seemed to be a little bit more alert today, however, still her review of system is not available due to significant somnolence. Patient has been coughing and expectorating yellowish sputum. Pt has a h/o severe peripheral neuropathy leading to need for walker for last 10 years who has had a decline over the last 10 months following a fall and shoulder fx in May 2016. Had surgical repair in August. Has never recovered, largely bedbound, developing decubitus ulcers on her body. She was admitted last month with  possible sepsis, severe back pain and thoracic decub ulcer with superficial cx growing MSSA. She was treated initially with IV abx then dc'ed on augmentin. ESR and CRP were nml at that time and CT did not show osteomyelitis although could not lay flat for MRI. Pt does have a h/o GERD per chart. She is taking a few po's but not much at meals per report. She does present w/ a congested cough.    Assessment / Plan / Recommendation Clinical Impression  Pt appears at increased risk for aspiration sec. to declined medical and respiratory status' at this time. Pt exhibited a congested cough baseline prior to any po trials; this same congested cough was noted intermittently during/post few po trials given. It did not appear directly related to the swallowing and the cough did not increase intensity or frequency despite more po trials. Pt exhibited oral phase deficits including mild-mod oral holding time - appeared to hold to coordinate swallow/breathing. With time, she swallowed and cleared appropriately. Noted min. increased WOB/SOB; rest breaks were given b/t trials to reduce the increased WOB. Rec. to continue w/ current po diet as ordered w/ trials to upgrade when appropriate, however, increased textured foods will require more effort and energy for management thus increasing WOB. Rec. Meds in Puree - crushed if nec./able. Rec. feeding assistance at meals to ease the work. Dietician following. Pt is at increased risk for aspiration.     Aspiration Risk  Mild aspiration risk -moderate aspiration risk   Diet Recommendation  continue current diet of full liquids w/ trials to upgrade  to puree; monitor respiratory status and need for objective study while admitted. Aspiration precautions.   Medication Administration: Whole meds with puree    Other  Recommendations Recommended Consults:  (dietician) Oral Care Recommendations: Oral care BID;Staff/trained caregiver to provide oral care   Follow up  Recommendations  Skilled Nursing facility    Frequency and Duration min 3x week  1 week       Prognosis Prognosis for Safe Diet Advancement: Guarded Barriers to Reach Goals: Severity of deficits      Swallow Study   General Date of Onset: 09/23/15 HPI: patient is 70 year old Caucasian female who presents to the hospital with complaints of weakness, lethargy. On arrival to emergency room, she was noted to have elevated white blood cell count also hypotension was felt to have sepsis and was admitted to the hospital for broad-spectrum antibiotic therapy. She seemed to be a little bit more alert today, however, still her review of system is not available due to significant somnolence. Patient has been coughing and expectorating yellowish sputum. Pt has a h/o severe peripheral neuropathy leading to need for walker for last 10 years who has had a decline over the last 10 months following a fall and shoulder fx in May 2016. Had surgical repair in August. Has never recovered, largely bedbound, developing decubitus ulcers on her body. She was admitted last month with possible sepsis, severe back pain and thoracic decub ulcer with superficial cx growing MSSA. She was treated initially with IV abx then dc'ed on augmentin. ESR and CRP were nml at that time and CT did not show osteomyelitis although could not lay flat for MRI. Pt does have a h/o GERD per chart. She is taking a few po's but not much at meals per report. She does present w/ a congested cough.  Type of Study: Bedside Swallow Evaluation Previous Swallow Assessment: none Diet Prior to this Study:  (full liquid per MD order this admission) Temperature Spikes Noted:  (temp 97.6-99.6; wbc 12.6 yesterday) Respiratory Status: Room air History of Recent Intubation: No Behavior/Cognition: Cooperative;Pleasant mood;Distractible;Requires cueing (Drowsy) Oral Cavity Assessment: Dry Oral Care Completed by SLP: Yes Oral Cavity - Dentition: Missing  dentition Vision: Functional for self-feeding Self-Feeding Abilities: Total assist Patient Positioning: Upright in bed Baseline Vocal Quality: Low vocal intensity Volitional Cough: Congested Volitional Swallow: Able to elicit    Oral/Motor/Sensory Function Overall Oral Motor/Sensory Function: Within functional limits (grossly)   Ice Chips Ice chips: Impaired Presentation: Spoon (fed; 3 trials) Oral Phase Impairments:  (overall weakness during bolus management) Oral Phase Functional Implications:  (decreased manipulation effort) Pharyngeal Phase Impairments: Cough - Delayed (congested cough occured post trials x1)   Thin Liquid Thin Liquid: Impaired Presentation: Cup (2-3 trials; fed) Oral Phase Impairments:  (overall weakness during bolus management) Oral Phase Functional Implications: Prolonged oral transit;Oral holding (appeared to hold to coordinate swallow/breathing) Pharyngeal  Phase Impairments: Cough - Delayed (x1 - congested cough)    Nectar Thick Nectar Thick Liquid: Impaired Presentation: Straw (fed; 3 trials) Oral Phase Impairments:  (overall weakness during bolus management) Oral phase functional implications: Oral holding;Prolonged oral transit (appeared to hold to coordinate swallow/breathing) Pharyngeal Phase Impairments: Cough - Delayed (congested cough x1)   Honey Thick Honey Thick Liquid: Not tested   Puree Puree: Impaired Presentation: Spoon (fed; 3 trials) Oral Phase Impairments:  (overall weakness during bolus management) Oral Phase Functional Implications: Prolonged oral transit;Oral holding (appeared to hold to coordinate swallow/breathing) Pharyngeal Phase Impairments: Cough - Delayed (congested cough x1)   Solid  GO   Solid: Not tested        Orinda Kenner, MS, CCC-SLP  Watson,Katherine 09/25/2015,4:04 PM

## 2015-09-25 NOTE — Progress Notes (Signed)
[facility] INFECTIOUS DISEASE PROGRESS NOTE Date of Admission:  09/23/2015     ID: [name] is a 70 y.o. female with  Decub ulcer thoracic area Active Problems:   Sepsis ([place])   Dehydration   Subjective: Moaning in pain with CXR just done. No fevers. Has some progressive sob.   ROS  Eleven systems are reviewed and negative except per hpi  Medications:  Antibiotics Given (last 72 hours)    Date/Time Action Medication Dose Rate   09/24/15 0254 Given   vancomycin (VANCOCIN) IVPB 750 mg/150 ml premix 750 mg 150 mL/hr   09/24/15 0254 Given   piperacillin-tazobactam (ZOSYN) IVPB 3.375 g 3.375 g 12.5 mL/hr   09/24/15 1813 Given   vancomycin (VANCOCIN) IVPB 750 mg/150 ml premix 750 mg 150 mL/hr   09/25/15 0951 Given   cefTRIAXone (ROCEPHIN) 1 g in dextrose 5 % 50 mL IVPB 1 g 100 mL/hr     . ALPRAZolam  0.25 mg Oral BID  . cefTRIAXone (ROCEPHIN)  IV  1 g Intravenous Daily  . dextrose  50 mL Intravenous Once  . enoxaparin (LOVENOX) injection  40 mg Subcutaneous Q24H  . feeding supplement (GLUCERNA SHAKE)  237 mL Oral TID WC  . fentaNYL  25 mcg Transdermal Q72H  . insulin aspart  3 Units Subcutaneous TID WC  . magnesium oxide  400 mg Oral BID  . megestrol  40 mg Oral Daily  . morphine  30 mg Oral Q12H  . pantoprazole  40 mg Oral Daily  . polyethylene glycol  17 g Oral BID  . potassium chloride  40 mEq Oral BID  . sodium chloride  3 mL Intravenous Q12H  . vancomycin  750 mg Intravenous Q24H    Objective: Vital signs in last 24 hours: Temp:  [97.5 F (36.4 C)-99.6 F (37.6 C)] 99.6 F (37.6 C) (01/12 1524) Pulse Rate:  [102-111] 105 (01/12 1524) Resp:  [16-18] 18 (01/12 1524) BP: (116-137)/(44-71) 123/58 mmHg (01/12 1524) SpO2:  [93 %-100 %] 95 % (01/12 1524) Weight:  [50.8 kg (111 lb 15.9 oz)] 50.8 kg (111 lb 15.9 oz) (01/12 0500) Constitutional: Moaning In pain but able to calm down and converse some. appears very thin,  HENT: [place]/AT, PERRLA, no scleral  icterus Mouth/Throat: Oropharynx is clear and dry . No oropharyngeal exudate.  Cardiovascular: Normal rate, regular rhythm and normal heart sounds.  Pulmonary/Chest: Effort normal bilateral rhonchi Neck supple, no nuchal rigidit Abdominal: Soft. Bowel sounds are normal. exhibits no distension midl diffuse ttp Lymphadenopathy: no cervical adenopathy. No axillary adenopathy Neurological: awake and interactive but not fully oriented Skin: pt was in pain and did not want to turn Psychiatric: moaning in pain   Lab Results  Recent Labs  09/23/15 2233 09/24/15 0432 09/24/15 1546 09/25/15 0522  WBC 17.9* 12.6*  --   --   HGB 10.3* 9.7*  --   --   HCT 31.3* 30.8*  --   --   NA 139 141  --  138  K 3.7 3.2* 3.3* 3.3*  CL 113* 116*  --  116*  CO2 20* 18*  --  19*  BUN 22* 19  --  11  CREATININE 0.64 0.43*  --  0.46    Microbiology: Results for orders placed or performed during the hospital encounter of 09/23/15  Culture, blood (Routine X 2) w Reflex to ID Panel     Status: None (Preliminary result)   Collection Time: 09/23/15 11:38 PM  Result Value Ref Range  Status   Specimen Description BLOOD LEFT ARM  Final   Special Requests BOTTLES DRAWN AEROBIC AND ANAEROBIC 5ML  Final   Culture NO GROWTH 1 DAY  Final   Report Status PENDING  Incomplete  Culture, blood (Routine X 2) w Reflex to ID Panel     Status: None (Preliminary result)   Collection Time: 09/23/15 11:38 PM  Result Value Ref Range Status   Specimen Description BLOOD BLOOD RIGHT FOREARM  Final   Special Requests BOTTLES DRAWN AEROBIC AND ANAEROBIC 5ML  Final   Culture NO GROWTH 2 DAYS  Final   Report Status PENDING  Incomplete  Culture, expectorated sputum-assessment     Status: None   Collection Time: 09/24/15  1:00 PM  Result Value Ref Range Status   Specimen Description EXPECTORATED SPUTUM  Final   Special Requests Normal  Final   Sputum evaluation THIS SPECIMEN IS ACCEPTABLE FOR SPUTUM CULTURE  Final   Report  Status 09/24/2015 FINAL  Final  Culture, respiratory (NON-Expectorated)     Status: None (Preliminary result)   Collection Time: 09/24/15  1:00 PM  Result Value Ref Range Status   Specimen Description EXPECTORATED SPUTUM  Final   Special Requests Normal Reflexed from [id number]  Final   Gram Stain   Final    MANY WBC SEEN MANY GRAM POSITIVE COCCI IN CHAINS FEW YEAST RARE GRAM POSITIVE RODS EXCELLENT SPECIMEN - 90-100% WBCS    Culture HOLDING FOR POSSIBLE PATHOGEN  Final   Report Status PENDING  Incomplete    Studies/Results: Ct Abdomen Pelvis W Contrast  09/24/2015  CLINICAL DATA:  Decubitus ulcer. Sepsis. Elevated white blood cell count and bilirubin. EXAM: CT ABDOMEN, AND PELVIS WITH CONTRAST TECHNIQUE: Multidetector CT imaging of theabdomen and pelvis was performed following the standard protocol during bolus administration of intravenous contrast. CONTRAST:  38m OMNIPAQUE IOHEXOL 300 MG/ML  SOLN COMPARISON:  CT 09/10/2009, 09/11/2015. FINDINGS: CT ABDOMEN AND PELVIS FINDINGS Lower chest: Mild branching nodular airspace disease at the RIGHT lung base with small effusion. Hepatobiliary: Moderate intrahepatic and extrahepatic biliary duct dilatation. Patient status post cholecystectomy. The degree of dilatation is increased from 2010. The common bile duct measures 9 mm. Dilatation to the ampullary level. Pancreas: The pancreatic duct is dilated to 7 mm. There is atrophy of the pancreatic parenchyma. Potential ductus divisum anatomy. Spleen: Normal spleen Adrenals/urinary tract: Adrenal glands and kidneys are normal. The ureters and bladder normal. The bladder is distended. Stomach/Bowel: Stomach, small bowel, appendix, and cecum are normal. Moderate volume stool throughout the colon. Large volume stool in the rectosigmoid colon due to the rectum. Vascular/Lymphatic: Abdominal aorta is normal caliber with atherosclerotic calcification. There is no retroperitoneal or periportal lymphadenopathy. No  pelvic lymphadenopathy. Reproductive: Uterus and ovaries are normal. Other: No free fluid or abscess in the abdomen pelvis. Musculoskeletal: No osseous erosion to suggest acute osteomyelitis. Small skin ulceration posterior to the inferior sacrum on image 70, series 2. No subcutaneous abscess identified. IMPRESSION: 1. Linear nodular airspace disease in the RIGHT lower lobe suggesting pneumonia or aspiration pneumonitis. 2. Biliary Dilatation involving the intrahepatic and extrahepatic ducts and common bile duct up to the ampulla. No pancreatic mass identified. With elevated bilirubin, patient may benefit from ERCP. MRCP may help evaluate the distal common bile duct and pancreatic head however acutely ill patients are often poor candidates for MRCP due to inability to hold breath for the extended sequences. 3. Dilatation of the pancreatic duct is progressed from 2010. Potential ductus divisum anatomy. Recommend evaluation with  MRCP / ERCP as above. 4. Bladder distension and moderate distal stool volume. Recommend clinical correlation. Electronically Signed   By: Suzy Bouchard M.D.   On: 09/24/2015 15:42   Dg Chest Portable 1 View  09/24/2015  CLINICAL DATA:  Lethargy.  Sepsis. EXAM: PORTABLE CHEST 1 VIEW COMPARISON:  09/10/2015 FINDINGS: The cardiomediastinal contours are normal for degree of patient rotation. Improved right basilar aeration with minimal residual atelectasis. No confluent airspace disease. No pleural effusion or pneumothorax. Bones are under mineralized. Multiple old left-sided rib fractures. Postsurgical change in the right proximal humerus. IMPRESSION: No active disease. Electronically Signed   By: Jeb Levering M.D.   On: 09/24/2015 00:13    Assessment/Plan: SRIJA SOUTHARD is a 70 y.o. female with a history of diabetes mellitus diet controlled, hypertension, severe peripheral neuropathy leading to need for walker for last 10 years who has had a decline over the last 10 motnhs  following a fall and shoulder fx in May 2016. Had surgical repair in August. Has never recovered, largely bedbound, developing decubitus ulcers. She was admitted last month with possible sepsis, severe back pain and thoracic decub ulcer with superficial cx growing MSSA. She was treated initially with IV abx then dced on augmentin. ESR and CRP were nml at that time and CT did not show osteomyelitis although could not lay flat for MRI.  She was readmitted with lethargy and confusion. WBC on admit was 17, not febrile.  Her decub ulcers are managed at wound care center. Saw Dr Con Memos last week and told per husband wounds look stableStill with back pain as well.   She seems to have had a general decline, she has lost 15-20 pounds, cannot get oob, has developed multiple decubs. I have reviewed prior MMG/ US breast in April suggesting fu in Oct given some findings of concern - this was not done due to other issues with the patient. She also has had MRI brain showing extensive WM changes.  I am concerned for an underlying malignancy a She has had CT abd with biliary dilation concerning for obsturciton but is refusing WU with MRCP or ERCP.  CXR now also shows progressive infiltrate.  REC Given progressive cxr findings will need to treat for HCAP - change ctx to zosyn, continue vanco She has progressive decline and would suggest palliative care consult   Thank you very much for the consult. Will follow with you.  [place], [place]   09/25/2015, 4:16 PM

## 2015-09-25 NOTE — Progress Notes (Signed)
[facility] [place] NAME: Anysa Ivers    MR#:  [id number]  DATE OF BIRTH:  [birth year]  SUBJECTIVE:  CHIEF COMPLAINT:   Chief Complaint  Patient presents with  . Fatigue   patient is 70 year old Caucasian female who presents to the hospital with complaints of weakness, lethargy. On arrival to emergency room, she was noted to have elevated white blood cell count also hypotension was felt to have sepsis and was admitted to the hospital for broad-spectrum antibiotic therapy. Agitated, restless and confused today, complains of pain in the back. Fentanyl patch was placed on the 10th, to be changed tomorrow. Refuses MRCP Review of Systems  Unable to perform ROS: mental status change    VITAL SIGNS: Blood pressure 123/58, pulse 105, temperature 99.6 F (37.6 C), temperature source Oral, resp. rate 18, height 5\' 6"  (1.676 m), weight 50.8 kg (111 lb 15.9 oz), SpO2 95 %.  PHYSICAL EXAMINATION:   GENERAL:  70 y.o.-year-old patient lying in the bed in moderate distress, agitated, restless, confused, trying to get out from the bed. Malnourished with decreased muscle mass, chronic sick appearance EYES: Pupils equal, round, reactive to light and accommodation. No scleral icterus. Extraocular muscles intact.  HEENT: Head atraumatic, normocephalic. Oropharynx and nasopharynx clear.  NECK:  Supple, no jugular venous distention. No thyroid enlargement, no tenderness.  LUNGS: Some diminished breath sounds bilaterally, no wheezing, rales,rhonchi or crepitation. No use of accessory muscles of respiration. Intermittent cough with rattling in the chest from the distance CARDIOVASCULAR: S1, S2 normal. No murmurs, rubs, or gallops.  ABDOMEN: Soft, tender in left lower quadrant with some voluntary guarding, nondistended. Bowel sounds present. No organomegaly or mass.  EXTREMITIES: No pedal edema, cyanosis, or clubbing.  NEUROLOGIC: Cranial nerves II through XII  are intact. Muscle strength 5/5 in all extremities. Sensation intact. Gait not checked.  PSYCHIATRIC: The patient is alert and disoriented, confused, restless, agitated SKIN: No obvious rash, lesion, or ulcer.   ORDERS/RESULTS REVIEWED:   CBC  Recent Labs Lab 09/23/15 2233 09/24/15 0432  WBC 17.9* 12.6*  HGB 10.3* 9.7*  HCT 31.3* 30.8*  PLT 240 216  MCV 89.9 89.8  MCH 29.6 28.4  MCHC 32.9 31.7*  RDW 20.6* 20.2*  LYMPHSABS  --  0.5*  MONOABS  --  0.5  EOSABS  --  0.5  BASOSABS  --  0.1   ------------------------------------------------------------------------------------------------------------------  Chemistries   Recent Labs Lab 09/23/15 2233 09/24/15 0432 09/24/15 1546 09/25/15 0522  NA 139 141  --  138  K 3.7 3.2* 3.3* 3.3*  CL 113* 116*  --  116*  CO2 20* 18*  --  19*  GLUCOSE 126* 125*  --  182*  BUN 22* 19  --  11  CREATININE 0.64 0.43*  --  0.46  CALCIUM 10.0 9.0  --  8.3*  MG  --  1.7  --   --   AST 26 21  --  19  ALT 27 21  --  17  ALKPHOS 266* 224*  --  245*  BILITOT 1.1 1.3*  --  0.8   ------------------------------------------------------------------------------------------------------------------ estimated creatinine clearance is 54 mL/min (by C-G formula based on Cr of 0.46). ------------------------------------------------------------------------------------------------------------------ No results for input(s): TSH, T4TOTAL, T3FREE, THYROIDAB in the last 72 hours.  Invalid input(s): FREET3  Cardiac Enzymes  Recent Labs Lab 09/23/15 2233  TROPONINI <0.03   ------------------------------------------------------------------------------------------------------------------ Invalid input(s): POCBNP ---------------------------------------------------------------------------------------------------------------  RADIOLOGY: Ct Abdomen Pelvis W Contrast  09/24/2015  CLINICAL  DATA:  Decubitus ulcer. Sepsis. Elevated white blood cell count and  bilirubin. EXAM: CT ABDOMEN, AND PELVIS WITH CONTRAST TECHNIQUE: Multidetector CT imaging of theabdomen and pelvis was performed following the standard protocol during bolus administration of intravenous contrast. CONTRAST:  22mL OMNIPAQUE IOHEXOL 300 MG/ML  SOLN COMPARISON:  CT 09/10/2009, 09/11/2015. FINDINGS: CT ABDOMEN AND PELVIS FINDINGS Lower chest: Mild branching nodular airspace disease at the RIGHT lung base with small effusion. Hepatobiliary: Moderate intrahepatic and extrahepatic biliary duct dilatation. Patient status post cholecystectomy. The degree of dilatation is increased from 2010. The common bile duct measures 9 mm. Dilatation to the ampullary level. Pancreas: The pancreatic duct is dilated to 7 mm. There is atrophy of the pancreatic parenchyma. Potential ductus divisum anatomy. Spleen: Normal spleen Adrenals/urinary tract: Adrenal glands and kidneys are normal. The ureters and bladder normal. The bladder is distended. Stomach/Bowel: Stomach, small bowel, appendix, and cecum are normal. Moderate volume stool throughout the colon. Large volume stool in the rectosigmoid colon due to the rectum. Vascular/Lymphatic: Abdominal aorta is normal caliber with atherosclerotic calcification. There is no retroperitoneal or periportal lymphadenopathy. No pelvic lymphadenopathy. Reproductive: Uterus and ovaries are normal. Other: No free fluid or abscess in the abdomen pelvis. Musculoskeletal: No osseous erosion to suggest acute osteomyelitis. Small skin ulceration posterior to the inferior sacrum on image 70, series 2. No subcutaneous abscess identified. IMPRESSION: 1. Linear nodular airspace disease in the RIGHT lower lobe suggesting pneumonia or aspiration pneumonitis. 2. Biliary Dilatation involving the intrahepatic and extrahepatic ducts and common bile duct up to the ampulla. No pancreatic mass identified. With elevated bilirubin, patient may benefit from ERCP. MRCP may help evaluate the distal common  bile duct and pancreatic head however acutely ill patients are often poor candidates for MRCP due to inability to hold breath for the extended sequences. 3. Dilatation of the pancreatic duct is progressed from 2010. Potential ductus divisum anatomy. Recommend evaluation with MRCP / ERCP as above. 4. Bladder distension and moderate distal stool volume. Recommend clinical correlation. Electronically Signed   By: Suzy Bouchard M.D.   On: 09/24/2015 15:42   Dg Chest Portable 1 View  09/24/2015  CLINICAL DATA:  Lethargy.  Sepsis. EXAM: PORTABLE CHEST 1 VIEW COMPARISON:  09/10/2015 FINDINGS: The cardiomediastinal contours are normal for degree of patient rotation. Improved right basilar aeration with minimal residual atelectasis. No confluent airspace disease. No pleural effusion or pneumothorax. Bones are under mineralized. Multiple old left-sided rib fractures. Postsurgical change in the right proximal humerus. IMPRESSION: No active disease. Electronically Signed   By: Jeb Levering M.D.   On: 09/24/2015 00:13    EKG:  Orders placed or performed during the hospital encounter of 09/23/15  . ED EKG  . ED EKG  . EKG 12-Lead  . EKG 12-Lead    ASSESSMENT AND PLAN:  Active Problems:   Sepsis (HCC)   Dehydration  1. Sepsis due to right lower lobe pneumonia, questionable wound infection,  continue patient on broad-spectrum antibiotic therapy, awaiting for sputum cultures, blood cultures are negative 2. Right lower lobe Pneumonia, suspected aspiration pneumonitis,  speech therapist evaluation is requested, on full liquid diet with thin liquids 3. Biliary dilatation, refuses to get MRCP to rule out biliary stone. Get GI involved if MRCP is positive 4. Hypokalemia, magnesium level was borderline low at 1.7 supplement orally 5. Leukocytosis. Follow with therapy 6. Acidosis on BMP, unremarkable ABG, patient received bicarbonate overnight bicarbonate level has improved, follow in the morning 7. Acute  urinary retention on CT scan,  Foley catheter was placed 8. Constipation, enema given, had a bowel movement 9. Delirium, acute, etiology remains unclear, could be medication  as well as infection related, initiate Haldol and follow clinically Management plans discussed with the patient, family and they are in agreement.   DRUG ALLERGIES:  Allergies  Allergen Reactions  . Boniva [Ibandronic Acid] Other (See Comments)    Pain neuropathy worse  . Codeine Other (See Comments)    Nervous/hyper  . Erythromycin Other (See Comments)    "Liver damage"  . Levaquin [Levofloxacin In D5w] Other (See Comments)    Hallucinations     CODE STATUS:     Code Status Orders        Start     Ordered   09/24/15 0051  Full code   Continuous     09/24/15 0051    Code Status History    Date Active Date Inactive Code Status Order ID Comments User Context   09/09/2015  6:01 AM 09/16/2015 10:28 PM Full Code [id number]  Saundra Shelling, MD ED   04/24/2015 11:39 AM 04/26/2015  1:42 PM Full Code [id number]  Corky Mull, MD Inpatient      TOTAL critiocal care TIME TAKING CARE OF THIS PATIENT: 50 minutes.  Prolonged discussion today about patient's condition, treatment plan  with patient's husband. Time spent 10-15 minutes on discussions  Ayman Brull M.D on 09/25/2015 at 3:39 PM  Between 7am to 6pm - Pager - [phone]  After 6pm go to [email or web address] - password [email or web address] [facility]  [facility]  Office  [phone]  CC: Primary care physician; [name], MD

## 2015-09-25 NOTE — Clinical Documentation Improvement (Signed)
Internal Medicine  Patient's BMI 15.4.  Can a nutritional diagnosis be provided for this admission? Thank you   MalnutrionDocument Severity - Severe(third degree), Moderate (second degree), Mild (first degree)  Malnutrition type: Protein Calorie -[place] Cachexia Underweight Other condition Unable to clinically determine  Document any associated diagnoses/conditions   Supporting Information:  Nutritional assessment _20% weight loss in 4 months  Please exercise your independent, professional judgment when responding. A specific answer is not anticipated or expected.   Thank You, [place] [phone]

## 2015-09-25 NOTE — Consult Note (Signed)
ANTIBIOTIC CONSULT NOTE - INITIAL  Pharmacy Consult for zosyn Indication: pneumonia  Allergies  Allergen Reactions  . Boniva [Ibandronic Acid] Other (See Comments)    Pain neuropathy worse  . Codeine Other (See Comments)    Nervous/hyper  . Erythromycin Other (See Comments)    "Liver damage"  . Levaquin [Levofloxacin In D5w] Other (See Comments)    Hallucinations     Patient Measurements: Height: 5\' 6"  (167.6 cm) Weight: 111 lb 15.9 oz (50.8 kg) IBW/kg (Calculated) : 59.3 Adjusted Body Weight:   Vital Signs: Temp: 99.7 F (37.6 C) (01/12 1941) Temp Source: Oral (01/12 1941) BP: 128/64 mmHg (01/12 1941) Pulse Rate: 109 (01/12 1941) Intake/Output from previous day: 01/11 0701 - 01/12 0700 In: -  Out: 1500 [Urine:1500] Intake/Output from this shift:    Labs:  Recent Labs  09/23/15 2233 09/24/15 0432 09/25/15 0522  WBC 17.9* 12.6*  --   HGB 10.3* 9.7*  --   PLT 240 216  --   CREATININE 0.64 0.43* 0.46   Estimated Creatinine Clearance: 54 mL/min (by C-G formula based on Cr of 0.46). No results for input(s): VANCOTROUGH, VANCOPEAK, VANCORANDOM, GENTTROUGH, GENTPEAK, GENTRANDOM, TOBRATROUGH, TOBRAPEAK, TOBRARND, AMIKACINPEAK, AMIKACINTROU, AMIKACIN in the last 72 hours.   Microbiology: Recent Results (from the past 720 hour(s))  Blood culture (routine x 2)     Status: None   Collection Time: 09/09/15  4:57 AM  Result Value Ref Range Status   Specimen Description BLOOD BLOOD LEFT FOREARM  Final   Special Requests BOTTLES DRAWN AEROBIC AND ANAEROBIC 5ML  Final   Culture NO GROWTH 5 DAYS  Final   Report Status 09/14/2015 FINAL  Final  Blood culture (routine x 2)     Status: None   Collection Time: 09/09/15  4:57 AM  Result Value Ref Range Status   Specimen Description BLOOD RIGHT ANTECUBITAL  Final   Special Requests BOTTLES DRAWN AEROBIC AND ANAEROBIC 5ML  Final   Culture NO GROWTH 5 DAYS  Final   Report Status 09/14/2015 FINAL  Final  Wound culture      Status: None   Collection Time: 09/12/15  2:49 PM  Result Value Ref Range Status   Specimen Description WOUND  Final   Special Requests Normal  Final   Gram Stain RARE WBC SEEN RARE GRAM POSITIVE RODS   Final   Culture   Final    MODERATE GROWTH STAPHYLOCOCCUS AUREUS WITH NORMAL SKIN FLORA    Report Status 09/15/2015 FINAL  Final   Organism ID, Bacteria STAPHYLOCOCCUS AUREUS  Final      Susceptibility   Staphylococcus aureus - MIC*    CIPROFLOXACIN <=0.5 SENSITIVE Sensitive     ERYTHROMYCIN 0.5 SENSITIVE Sensitive     GENTAMICIN <=0.5 SENSITIVE Sensitive     OXACILLIN 0.5 SENSITIVE Sensitive     TRIMETH/SULFA <=10 SENSITIVE Sensitive     CLINDAMYCIN <=0.25 SENSITIVE Sensitive     CEFOXITIN SCREEN NEGATIVE Sensitive     Inducible Clindamycin NEGATIVE Sensitive     TETRACYCLINE Value in next row Sensitive      SENSITIVE<=1    * MODERATE GROWTH STAPHYLOCOCCUS AUREUS  Culture, blood (Routine X 2) w Reflex to ID Panel     Status: None (Preliminary result)   Collection Time: 09/23/15 11:38 PM  Result Value Ref Range Status   Specimen Description BLOOD LEFT ARM  Final   Special Requests BOTTLES DRAWN AEROBIC AND ANAEROBIC 5ML  Final   Culture NO GROWTH 1 DAY  Final  Report Status PENDING  Incomplete  Culture, blood (Routine X 2) w Reflex to ID Panel     Status: None (Preliminary result)   Collection Time: 09/23/15 11:38 PM  Result Value Ref Range Status   Specimen Description BLOOD BLOOD RIGHT FOREARM  Final   Special Requests BOTTLES DRAWN AEROBIC AND ANAEROBIC 5ML  Final   Culture NO GROWTH 2 DAYS  Final   Report Status PENDING  Incomplete  Culture, expectorated sputum-assessment     Status: None   Collection Time: 09/24/15  1:00 PM  Result Value Ref Range Status   Specimen Description EXPECTORATED SPUTUM  Final   Special Requests Normal  Final   Sputum evaluation THIS SPECIMEN IS ACCEPTABLE FOR SPUTUM CULTURE  Final   Report Status 09/24/2015 FINAL  Final  Culture,  respiratory (NON-Expectorated)     Status: None (Preliminary result)   Collection Time: 09/24/15  1:00 PM  Result Value Ref Range Status   Specimen Description EXPECTORATED SPUTUM  Final   Special Requests Normal Reflexed from [id number]  Final   Gram Stain   Final    MANY WBC SEEN MANY GRAM POSITIVE COCCI IN CHAINS FEW YEAST RARE GRAM POSITIVE RODS EXCELLENT SPECIMEN - 90-100% WBCS    Culture HOLDING FOR POSSIBLE PATHOGEN  Final   Report Status PENDING  Incomplete    Medical History: Past Medical History  Diagnosis Date  . Diabetes mellitus without complication ([place])   . Hypertension   . Neuropathy ([place])   . GERD (gastroesophageal reflux disease)   . Hypothyroidism   . Breast cancer ([place])     breast-left  . Bed sore on heel   . Bed sore on elbow     Medications:  Scheduled:  . ALPRAZolam  0.25 mg Oral BID  . dextrose  50 mL Intravenous Once  . enoxaparin (LOVENOX) injection  40 mg Subcutaneous Q24H  . feeding supplement (GLUCERNA SHAKE)  237 mL Oral TID WC  . fentaNYL  25 mcg Transdermal Q72H  . insulin aspart  3 Units Subcutaneous TID WC  . magnesium oxide  400 mg Oral BID  . megestrol  40 mg Oral Daily  . morphine  30 mg Oral Q12H  . pantoprazole  40 mg Oral Daily  . piperacillin-tazobactam (ZOSYN)  IV  3.375 g Intravenous 3 times per day  . polyethylene glycol  17 g Oral BID  . potassium chloride  40 mEq Oral BID  . sodium chloride  3 mL Intravenous Q12H  . vancomycin  750 mg Intravenous Q24H   Assessment: Pt is a 70 year old female with many medical conditions. Chest x-ray is worrisome for PNA. Dr. [name] would like to broaden abx therapy with zosyn. Pharmacy consulted to dose  Goal of Therapy:  resolution of infection  Plan:  Follow up culture results zosyn 3.375g q 8 hours EI infusion. Pharmacy to continue to monitor. Thank you for the consult.  Shavonn Convey D Develle Sievers 09/25/2015,7:53 PM

## 2015-09-25 NOTE — Progress Notes (Signed)
Inpatient Diabetes Program Recommendations  AACE/ADA: New Consensus Statement on Inpatient Glycemic Control (2015)  Target Ranges:  Prepandial:   less than 140 mg/dL      Peak postprandial:   less than 180 mg/dL (1-2 hours)      Critically ill patients:  140 - 180 mg/dL  Results for [name], [name] (MRN [id number]) as of 09/25/2015 07:37  Ref. Range 09/25/2015 05:22  Glucose Latest Ref Range: 65-99 mg/dL 182 (H)   Results for [name], [name] (MRN [id number]) as of 09/25/2015 07:37  Ref. Range 09/24/2015 08:20 09/24/2015 11:39 09/24/2015 15:17 09/24/2015 21:31  Glucose-Capillary Latest Ref Range: 65-99 mg/dL 96 101 (H) 130 (H) 149 (H)   Review of Glycemic Control  Diabetes history: DM2 Outpatient Diabetes medications: None Current orders for Inpatient glycemic control: None  Inpatient Diabetes Program Recommendations: Correction (SSI): Patient has a history of Type2 diabetes. Please consider ordering CBGs with Novolog correction ACHS. HgbA1C: Please consider ordering an A1C to evaluate glycemic control over the past 2-3 months.  Thanks, Barnie Alderman, RN, MSN, CDE Diabetes Coordinator Inpatient Diabetes Program [phone] (Team Pager from [place] to [place]) [phone] ([place] office) [phone] [facility] office) [phone] [facility] office)

## 2015-09-25 NOTE — Progress Notes (Addendum)
PT PLACED ON  SIZE WISE ALTERNATING BED THIS EVENING. CONTINUED NP WET SOUNDING  COUGH.  POOR APPETITE . FSBS DROPPED TO 60 . 1 AMP D50 GIVEN PER DR VAICKUTE  ALONG WITH D/C FSBS . MD ORDERS  CHECK FSBS [phone]. Blood sugar now 130

## 2015-09-25 NOTE — Consult Note (Addendum)
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 Present on admission.  Malnutrition with albumin 2.6 impairing ability to heal.  Sacral deep tissue injury, present on admission Left lateral malleolus 0.5 cm scabbed  Right medial heel deep tissue injury maroon intact 1 cm in diameter Right lateral malleolus0.5 cm scabbed Wound type: Full thickness pressure ulcers Refuses heel protectors due to difficulty transferring and refuses turning and repositioning at times.  Is contracted and painful with movement. Neuropathic pain.  Has been to wound care center.  Will order mattress replacement with low air loss feature.  Pressure Ulcer POA: Yes Measurement: Mid spine 2 cm x 0.5 cm x 0.1 cm maroon discoloration with denuded center deep tissue injury Left trochanter 0.5 cm in diameter stage 3 pressure injury Right trochanter 2 cm x 2 cm x 0.2 cm unstageable pressure injury Wound [id number] red Drainage (amount, consistency, odor) Moderate serosanguinous from right hip Minimal serosanguinous from left hip and spine ulcers Periwound:intact Dressing procedure/placement/frequency:Cleanse nonintact lesions to midspine, left and right trochanter with NS and pat Gently dry. Apply Allevyn silicone border foam dressing Change every 3 days and PRN soilage.  Float heels with a pillow under the calves.  Will not follow at this time.  Please re-consult if needed.  Domenic Moras RN BSN [place] Pager [phone]

## 2015-09-26 ENCOUNTER — Inpatient Hospital Stay: Payer: Medicare Other

## 2015-09-26 ENCOUNTER — Ambulatory Visit: Payer: Medicare Other | Admitting: Surgery

## 2015-09-26 DIAGNOSIS — G894 Chronic pain syndrome: Secondary | ICD-10-CM

## 2015-09-26 LAB — CBC
HEMATOCRIT: 28.4 % — AB (ref 35.0–47.0)
HEMOGLOBIN: 9.5 g/dL — AB (ref 12.0–16.0)
MCH: 30 pg (ref 26.0–34.0)
MCHC: 33.5 g/dL (ref 32.0–36.0)
MCV: 89.7 fL (ref 80.0–100.0)
Platelets: 240 10*3/uL (ref 150–440)
RBC: 3.17 MIL/uL — AB (ref 3.80–5.20)
RDW: 20 % — ABNORMAL HIGH (ref 11.5–14.5)
WBC: 8.6 10*3/uL (ref 3.6–11.0)

## 2015-09-26 LAB — BASIC METABOLIC PANEL
ANION GAP: 8 (ref 5–15)
BUN: 8 mg/dL (ref 6–20)
CALCIUM: 8.5 mg/dL — AB (ref 8.9–10.3)
CO2: 18 mmol/L — ABNORMAL LOW (ref 22–32)
Chloride: 110 mmol/L (ref 101–111)
Creatinine, Ser: 0.4 mg/dL — ABNORMAL LOW (ref 0.44–1.00)
Glucose, Bld: 103 mg/dL — ABNORMAL HIGH (ref 65–99)
Potassium: 3.3 mmol/L — ABNORMAL LOW (ref 3.5–5.1)
Sodium: 136 mmol/L (ref 135–145)

## 2015-09-26 LAB — GLUCOSE, CAPILLARY
GLUCOSE-CAPILLARY: 118 mg/dL — AB (ref 65–99)
Glucose-Capillary: 93 mg/dL (ref 65–99)
Glucose-Capillary: 111 mg/dL — ABNORMAL HIGH (ref 65–99)
Glucose-Capillary: 123 mg/dL — ABNORMAL HIGH (ref 65–99)
Glucose-Capillary: 121 mg/dL — ABNORMAL HIGH (ref 65–99)

## 2015-09-26 MED ORDER — OXYCODONE HCL 5 MG PO TABS
7.5000 mg | ORAL_TABLET | ORAL | Status: DC | PRN
  Administered 2015-09-26 – 2015-09-27 (×3): 7.5 mg via ORAL
  Filled 2015-09-26 (×3): qty 2

## 2015-09-26 MED ORDER — INSULIN ASPART 100 UNIT/ML ~~LOC~~ SOLN
0.0000 [IU] | Freq: Three times a day (TID) | SUBCUTANEOUS | Status: DC
  Administered 2015-09-26 – 2015-09-28 (×3): 1 [IU] via SUBCUTANEOUS
  Administered 2015-09-29: 2 [IU] via SUBCUTANEOUS
  Administered 2015-09-30 (×2): 1 [IU] via SUBCUTANEOUS
  Filled 2015-09-26 (×2): qty 1
  Filled 2015-09-26: qty 2
  Filled 2015-09-26 (×2): qty 1

## 2015-09-26 MED ORDER — ALPRAZOLAM 0.25 MG PO TABS
0.2500 mg | ORAL_TABLET | Freq: Three times a day (TID) | ORAL | Status: DC
  Administered 2015-09-26 – 2015-09-30 (×11): 0.25 mg via ORAL
  Filled 2015-09-26 (×11): qty 1

## 2015-09-26 MED ORDER — AMITRIPTYLINE HCL 10 MG PO TABS
10.0000 mg | ORAL_TABLET | Freq: Every day | ORAL | Status: DC
  Administered 2015-09-26 – 2015-09-29 (×4): 10 mg via ORAL
  Filled 2015-09-26 (×5): qty 1

## 2015-09-26 MED ORDER — STERILE WATER FOR INJECTION IV SOLN
INTRAVENOUS | Status: DC
  Administered 2015-09-26 – 2015-09-29 (×2): via INTRAVENOUS
  Filled 2015-09-26 (×6): qty 850

## 2015-09-26 MED ORDER — GLUCERNA SHAKE PO LIQD
237.0000 mL | Freq: Three times a day (TID) | ORAL | Status: DC
  Administered 2015-09-26 – 2015-09-30 (×9): 237 mL via ORAL

## 2015-09-26 MED ORDER — ZOLPIDEM TARTRATE 5 MG PO TABS
5.0000 mg | ORAL_TABLET | Freq: Every evening | ORAL | Status: DC | PRN
  Administered 2015-09-26 – 2015-09-29 (×4): 5 mg via ORAL
  Filled 2015-09-26 (×4): qty 1

## 2015-09-26 MED ORDER — AZITHROMYCIN 500 MG IV SOLR
500.0000 mg | INTRAVENOUS | Status: DC
  Administered 2015-09-26 – 2015-09-29 (×4): 500 mg via INTRAVENOUS
  Filled 2015-09-26 (×5): qty 500

## 2015-09-26 NOTE — Progress Notes (Signed)
[facility] INFECTIOUS [facility] PROGRESS NOTE Date of Admission:  09/23/2015     ID: [name] is a 70 y.o. female with  Decub ulcer thoracic area Active Problems:   Sepsis ([place])   Dehydration   Subjective: Much less pain, more alert, asking for pain meds  ROS  Eleven systems are reviewed and negative except per hpi  Medications:  Antibiotics Given (last 72 hours)    Date/Time Action Medication Dose Rate   09/24/15 0254 Given   vancomycin (VANCOCIN) IVPB 750 mg/150 ml premix 750 mg 150 mL/hr   09/24/15 0254 Given   piperacillin-tazobactam (ZOSYN) IVPB 3.375 g 3.375 g 12.5 mL/hr   09/24/15 1813 Given   vancomycin (VANCOCIN) IVPB 750 mg/150 ml premix 750 mg 150 mL/hr   09/25/15 0951 Given   cefTRIAXone (ROCEPHIN) 1 g in dextrose 5 % 50 mL IVPB 1 g 100 mL/hr   09/25/15 1654 Given   vancomycin (VANCOCIN) IVPB 750 mg/150 ml premix 750 mg 150 mL/hr   09/25/15 2138 Given   piperacillin-tazobactam (ZOSYN) IVPB 3.375 g 3.375 g 12.5 mL/hr   09/26/15 1052 Given   azithromycin (ZITHROMAX) 500 mg in dextrose 5 % 250 mL IVPB 500 mg 250 mL/hr     . ALPRAZolam  0.25 mg Oral BID  . azithromycin  500 mg Intravenous Q24H  . dextrose  50 mL Intravenous Once  . enoxaparin (LOVENOX) injection  40 mg Subcutaneous Q24H  . feeding supplement (GLUCERNA SHAKE)  237 mL Oral TID WC  . fentaNYL  25 mcg Transdermal Q72H  . insulin aspart  0-9 Units Subcutaneous TID WC  . magnesium oxide  400 mg Oral BID  . megestrol  40 mg Oral Daily  . morphine  30 mg Oral Q12H  . pantoprazole  40 mg Oral Daily  . piperacillin-tazobactam (ZOSYN)  IV  3.375 g Intravenous 3 times per day  . polyethylene glycol  17 g Oral BID  . potassium chloride  40 mEq Oral BID  . sodium chloride  3 mL Intravenous Q12H  . vancomycin  750 mg Intravenous Q24H    Objective: Vital signs in last 24 hours: Temp:  [98.9 F (37.2 C)-99.7 F (37.6 C)] 98.9 F (37.2 C) (01/13 1116) Pulse Rate:  [104-109] 107 (01/13  1116) Resp:  [18] 18 (01/13 1116) BP: (116-146)/(57-81) 141/79 mmHg (01/13 1116) SpO2:  [91 %-96 %] 96 % (01/13 1116) Constitutional:Calmer and able to converse but a little confused. appears very thin,  HENT: [place]/AT, PERRLA, no scleral icterus Mouth/Throat: Oropharynx is clear and dry . No oropharyngeal exudate.  Cardiovascular: Normal rate, regular rhythm and normal heart sounds.  Pulmonary/Chest: Effort normal bilateral rhonchi Neck supple, no nuchal rigidit Abdominal: Soft. Bowel sounds are normal. exhibits no distension midl diffuse ttp Lymphadenopathy: no cervical adenopathy. No axillary adenopathy Neurological: awake and interactive but not fully oriented Skin: decub covered  Psychiatric: moaning in pain   Lab Results  Recent Labs  09/24/15 0432  09/25/15 0522 09/26/15 0624  WBC 12.6*  --   --  8.6  HGB 9.7*  --   --  9.5*  HCT 30.8*  --   --  28.4*  NA 141  --  138 136  K 3.2*  < > 3.3* 3.3*  CL 116*  --  116* 110  CO2 18*  --  19* 18*  BUN 19  --  11 8  CREATININE 0.43*  --  0.46 0.40*  < > = values in this interval not displayed.  Microbiology: Results for orders placed or performed during the hospital encounter of 09/23/15  Culture, blood (Routine X 2) w Reflex to ID Panel     Status: None (Preliminary result)   Collection Time: 09/23/15 11:38 PM  Result Value Ref Range Status   Specimen Description BLOOD LEFT ARM  Final   Special Requests BOTTLES DRAWN AEROBIC AND ANAEROBIC 5ML  Final   Culture NO GROWTH 2 DAYS  Final   Report Status PENDING  Incomplete  Culture, blood (Routine X 2) w Reflex to ID Panel     Status: None (Preliminary result)   Collection Time: 09/23/15 11:38 PM  Result Value Ref Range Status   Specimen Description BLOOD BLOOD RIGHT FOREARM  Final   Special Requests BOTTLES DRAWN AEROBIC AND ANAEROBIC 5ML  Final   Culture NO GROWTH 3 DAYS  Final   Report Status PENDING  Incomplete  Culture, expectorated sputum-assessment     Status:  None   Collection Time: 09/24/15  1:00 PM  Result Value Ref Range Status   Specimen Description EXPECTORATED SPUTUM  Final   Special Requests Normal  Final   Sputum evaluation THIS SPECIMEN IS ACCEPTABLE FOR SPUTUM CULTURE  Final   Report Status 09/24/2015 FINAL  Final  Culture, respiratory (NON-Expectorated)     Status: None (Preliminary result)   Collection Time: 09/24/15  1:00 PM  Result Value Ref Range Status   Specimen Description EXPECTORATED SPUTUM  Final   Special Requests Normal Reflexed from [id number]  Final   Gram Stain   Final    MANY WBC SEEN MANY GRAM POSITIVE COCCI IN CHAINS FEW YEAST RARE GRAM POSITIVE RODS EXCELLENT SPECIMEN - 90-100% WBCS    Culture HOLDING FOR POSSIBLE PATHOGEN  Final   Report Status PENDING  Incomplete    Studies/Results: Ct Abdomen Pelvis W Contrast  09/24/2015  CLINICAL DATA:  Decubitus ulcer. Sepsis. Elevated white blood cell count and bilirubin. EXAM: CT ABDOMEN, AND PELVIS WITH CONTRAST TECHNIQUE: Multidetector CT imaging of theabdomen and pelvis was performed following the standard protocol during bolus administration of intravenous contrast. CONTRAST:  60m OMNIPAQUE IOHEXOL 300 MG/ML  SOLN COMPARISON:  CT 09/10/2009, 09/11/2015. FINDINGS: CT ABDOMEN AND PELVIS FINDINGS Lower chest: Mild branching nodular airspace disease at the RIGHT lung base with small effusion. Hepatobiliary: Moderate intrahepatic and extrahepatic biliary duct dilatation. Patient status post cholecystectomy. The degree of dilatation is increased from 2010. The common bile duct measures 9 mm. Dilatation to the ampullary level. Pancreas: The pancreatic duct is dilated to 7 mm. There is atrophy of the pancreatic parenchyma. Potential ductus divisum anatomy. Spleen: Normal spleen Adrenals/urinary tract: Adrenal glands and kidneys are normal. The ureters and bladder normal. The bladder is distended. Stomach/Bowel: Stomach, small bowel, appendix, and cecum are normal. Moderate volume  stool throughout the colon. Large volume stool in the rectosigmoid colon due to the rectum. Vascular/Lymphatic: Abdominal aorta is normal caliber with atherosclerotic calcification. There is no retroperitoneal or periportal lymphadenopathy. No pelvic lymphadenopathy. Reproductive: Uterus and ovaries are normal. Other: No free fluid or abscess in the abdomen pelvis. Musculoskeletal: No osseous erosion to suggest acute osteomyelitis. Small skin ulceration posterior to the inferior sacrum on image 70, series 2. No subcutaneous abscess identified. IMPRESSION: 1. Linear nodular airspace disease in the RIGHT lower lobe suggesting pneumonia or aspiration pneumonitis. 2. Biliary Dilatation involving the intrahepatic and extrahepatic ducts and common bile duct up to the ampulla. No pancreatic mass identified. With elevated bilirubin, patient may benefit from ERCP. MRCP may help evaluate the  distal common bile duct and pancreatic head however acutely ill patients are often poor candidates for MRCP due to inability to hold breath for the extended sequences. 3. Dilatation of the pancreatic duct is progressed from 2010. Potential ductus divisum anatomy. Recommend evaluation with MRCP / ERCP as above. 4. Bladder distension and moderate distal stool volume. Recommend clinical correlation. Electronically Signed   By: Suzy Bouchard M.D.   On: 09/24/2015 15:42   Dg Chest Port 1 View  09/25/2015  CLINICAL DATA:  Dyspnea.  Sepsis. EXAM: PORTABLE CHEST 1 VIEW COMPARISON:  Single view of the chest 09/23/2015 P FINDINGS: There is extensive bilateral airspace disease which is new since the prior study. Heart size is normal. No pneumothorax or pleural effusion. IMPRESSION: Extensive bilateral airspace disease worrisome for pneumonia. Electronically Signed   By: Inge Rise M.D.   On: 09/25/2015 16:29    Assessment/Plan: KADY TOOTHAKER is a 70 y.o. female with a history of diabetes mellitus diet controlled, hypertension, severe  peripheral neuropathy leading to need for walker for last 10 years who has had a decline over the last 10 motnhs following a fall and shoulder fx in May 2016. Had surgical repair in August. Has never recovered, largely bedbound, developing decubitus ulcers. She was admitted last month with possible sepsis, severe back pain and thoracic decub ulcer with superficial cx growing MSSA. She was treated initially with IV abx then dced on augmentin. ESR and CRP were nml at that time and CT did not show osteomyelitis although could not lay flat for MRI.  She was readmitted with lethargy and confusion. WBC on admit was 17, not febrile.  Her decub ulcers are managed at wound care center. Saw Dr Con Memos last week and told per husband wounds look stableStill with back pain as well.  She seems to have had a general decline, she has lost 15-20 pounds, cannot get oob, has developed multiple decubs. I have reviewed prior MMG/ US breast in April suggesting fu in Oct given some findings of concern - this was not done due to other issues with the patient. She also has had MRI brain showing extensive WM changes.  I am concerned for an underlying malignancy a She has had CT abd with biliary dilation concerning for obsturciton but is refusing WU with MRCP or ERCP.  CXR now also shows progressive infiltrate.  Speech evaluated and states :Mild aspiration risk -moderate aspiration risk  I suspect currently has aspiration PNA  REC Given progressive cxr findings will continue to treat for HCAP -  zosyn, continue vanco Check sputum sample She has progressive decline and would suggest palliative care consult  Thank you very much for the consult. Will follow with you.  [place], [place]   09/26/2015, 2:20 PM

## 2015-09-26 NOTE — Progress Notes (Signed)
Pharmacy Antibiotic Follow-up Note  [name] is a 70 y.o. year-old female admitted on 09/23/2015.  The patient is currently on day 3 of vancomycin, day 3 of Zosyn, and day 1 of Azithromycin for HCAP.  Assessment/Plan: This patient's current antibiotics will be continued without adjustments.  ID consulted.   Temp (24hrs), Avg:99.3 F (37.4 C), Min:98.9 F (37.2 C), Max:99.7 F (37.6 C)   Recent Labs Lab 09/23/15 2233 09/24/15 0432 09/26/15 0624  WBC 17.9* 12.6* 8.6    Recent Labs Lab 09/23/15 2233 09/24/15 0432 09/25/15 0522 09/26/15 0624  CREATININE 0.64 0.43* 0.46 0.40*   Estimated Creatinine Clearance: 54 mL/min (by C-G formula based on Cr of 0.4).    Allergies  Allergen Reactions  . Boniva [Ibandronic Acid] Other (See Comments)    Pain neuropathy worse  . Codeine Other (See Comments)    Nervous/hyper  . Erythromycin Other (See Comments)    "Liver damage"  . Levaquin [Levofloxacin In D5w] Other (See Comments)    Hallucinations     Antimicrobials this admission: 1/10 Zosyn >> 1/10 Vancomycin >>  1/12 Ceftriaxone >> 1/12 1/13 Azithromycin >>  Levels/dose changes this admission: Continue current orders for Vancomycin 750 mg IV q24h, Zosyn 3.375 gm IV q8h per EI protocol.  Azithromycin 500 mg IV q24h added today.   Trough ordered for 1/14 at 1400.  Microbiology results: 12/27 BCx: NGTD 1/11 Sputum: many GPC, rare GPR 12/30 WCx: MSSA  Thank you for allowing pharmacy to be a part of this patient's care.  Alexzandra Bilton G PharmD 09/26/2015 12:53 PM

## 2015-09-26 NOTE — Consult Note (Signed)
[facility] Consult   Reason for Consult:  Consult for 70 year old woman currently in the hospital with multiple medical problems but most importantly to her severe pain in her lower back from her sure ulcer. Also with sepsis. Also multiple other chronic medical issues. Concern raised about the patient's mental state and her supposedly refusal of some treatment Referring Physician:  Ether Griffins Patient Identification: [name] MRN:  [id number] Principal Diagnosis: Pain disorder Diagnosis:   Patient Active Problem List   Diagnosis Date Noted  . Chronic pain syndrome [G89.4] 09/26/2015  . Dehydration [E86.0] 09/24/2015  . Sepsis ([place]) [A41.9] 09/09/2015  . Decubitus ulcer, infected [L89.90] 09/09/2015  . 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 Stage II pressure ulcer of right elbow [L89.012] 07/04/2015  . Stage II pressure ulcer of right heel [L89.612] 07/04/2015  . Fracture, humerus, proximal [S42.209A] 04/24/2015    Total Time spent with patient: 1 hour  Subjective:   [name] is a 70 y.o. female patient admitted with "I am just in a great deal of pain".  HPI:  Patient interviewed. Chart reviewed. Case discussed with nursing staff. Old notes reviewed. 70 year old woman is currently in the hospital with sepsis and also complications of a unhealed pressure sore on her back. Apparently concern is being raised because the patient is perceived as having refused to get some offered treatments particularly an MRCP and possibly being uncooperative with some of the direct treatment of her wounds. On interview today the patient was initially cooperative although eventually she became less cooperative as she became more tired. She reports that her greatest concern by far as the severe pain in her back. She feels that her pain at her pressure sore is unrelieved throughout the day. She feels that her current pain regimen has made little or no  difference. As a result of that she feels that she cannot ever be comfortable in bed and that every movement that she makes increases her pain. She also reports that she has been unable to sleep at night because of the pain. Her mood is clearly feeling distressed although she did not describe herself as being depressed particularly from it. She does not report being hopeless. She continues to feel like there is hope for recovery and is looking forward to recovery and getting out of the hospital. She denied having any suicidal thoughts. Patient did not give evidence of having any psychotic symptoms. As far as delirium, the patient was completely alert and oriented. She refused to cooperate with some cognitive testing but was clear throughout the interview that she did not have a waxing and waning mental state and that she was fully oriented to her situation. On the other hand I think she does lack some mutual understanding with the treatment team about her procedures. Evidently an MRCP was recommended because of her dilated bile duct. The patient states that she refuses to have it done because it would require lying still on a flat board and that she is certain that that would be agonizingly painful. I do not get the impression that the pros and cons and importance of the procedure are clear to her.  Patient was somewhat difficult to interview because of her degree of distress.  Social history: Patient lives at home with her husband. Husband appears to be devoted to her care. Patient indicates strong positive feelings for her family.  Medical history: Patient has a history of cancer. She has a  history of sepsis dehydration recent fracture to her shoulder that required repair and now a pressure sore that has not healed well. This has resulted in chronic pain.  Substance abuse history: I did not ask her directly but from what I can see in the chart there doesn't seem to be an indication of substance abuse  problems  Past Psychiatric History: Patient indicates that she did see a psychiatrist many years ago. She did not want to discuss anything at all about the details of it so I'm lacking any other information. I don't know if she was ever prescribed antidepressant medication or exactly what symptoms she had.  Risk to Self: Is patient at risk for suicide?: No Risk to Others:   Prior Inpatient Therapy:   Prior Outpatient Therapy:    Past Medical History:  Past Medical History  Diagnosis Date  . Diabetes mellitus without complication ([place])   . Hypertension   . Neuropathy ([place])   . GERD (gastroesophageal reflux disease)   . Hypothyroidism   . Breast cancer ([place])     breast-left  . Bed sore on heel   . Bed sore on elbow     Past Surgical History  Procedure Laterality Date  . Cholecystectomy    . Breast lumpectomy Left   . Orif humerus fracture Right 04/24/2015    Procedure: OPEN REDUCTION INTERNAL FIXATION (ORIF) PROXIMAL HUMERUS FRACTURE;  Surgeon: Corky Mull, MD;  Location: [facility] ORS;  Service: Orthopedics;  Laterality: Right;  . Esophagogastroduodenoscopy N/A 09/12/2015    Procedure: ESOPHAGOGASTRODUODENOSCOPY (EGD);  Surgeon: Manya Silvas, MD;  Location: [facility] ENDOSCOPY;  Service: Endoscopy;  Laterality: N/A;   Family History:  Family History  Problem Relation Age of Onset  . Breast cancer Mother    Family Psychiatric  History: Patient was not able to get to the point in the interview of going over this. Don't have any further history but I don't think it's particularly important Social History:  History  Alcohol Use  . 0.0 oz/week  . 0 Standard drinks or equivalent per week    Comment: daily     History  Drug Use No    Social History   Social History  . Marital Status: Married    Spouse Name: N/A  . Number of Children: N/A  . Years of Education: N/A   Occupational History  . retired    Social History Main Topics  . Smoking status: Current Every Day Smoker  -- 1.00 packs/day for 40 years  . Smokeless tobacco: None  . Alcohol Use: 0.0 oz/week    0 Standard drinks or equivalent per week     Comment: daily  . Drug Use: No  . Sexual Activity: Not Asked   Other Topics Concern  . None   Social History Narrative   Additional Social History:                          Allergies:   Allergies  Allergen Reactions  . Boniva [Ibandronic Acid] Other (See Comments)    Pain neuropathy worse  . Codeine Other (See Comments)    Nervous/hyper  . Erythromycin Other (See Comments)    "Liver damage"  . Levaquin [Levofloxacin In D5w] Other (See Comments)    Hallucinations     Labs:  Results for orders placed or performed during the hospital encounter of 09/23/15 (from the past 48 hour(s))  Glucose, capillary     Status: Abnormal  Collection Time: 09/24/15  9:31 PM  Result Value Ref Range   Glucose-Capillary 149 (H) 65 - 99 mg/dL   Comment 1 Notify RN   Sedimentation rate     Status: Abnormal   Collection Time: 09/25/15  5:22 AM  Result Value Ref Range   Sed Rate 52 (H) 0 - 30 mm/hr  Basic metabolic panel     Status: Abnormal   Collection Time: 09/25/15  5:22 AM  Result Value Ref Range   Sodium 138 135 - 145 mmol/L   Potassium 3.3 (L) 3.5 - 5.1 mmol/L   Chloride 116 (H) 101 - 111 mmol/L   CO2 19 (L) 22 - 32 mmol/L   Glucose, Bld 182 (H) 65 - 99 mg/dL   BUN 11 6 - 20 mg/dL   Creatinine, Ser 0.46 0.44 - 1.00 mg/dL   Calcium 8.3 (L) 8.9 - 10.3 mg/dL   GFR calc non Af Amer >60 >60 mL/min   GFR calc Af Amer >60 >60 mL/min    Comment: (NOTE) The eGFR has been calculated using the CKD EPI equation. This calculation has not been validated in all clinical situations. eGFR's persistently <60 mL/min signify possible Chronic Kidney Disease.    Anion gap 3 (L) 5 - 15  Hepatic function panel     Status: Abnormal   Collection Time: 09/25/15  5:22 AM  Result Value Ref Range   Total Protein 5.1 (L) 6.5 - 8.1 g/dL   Albumin 2.6 (L) 3.5 -  5.0 g/dL   AST 19 15 - 41 U/L   ALT 17 14 - 54 U/L   Alkaline Phosphatase 245 (H) 38 - 126 U/L   Total Bilirubin 0.8 0.3 - 1.2 mg/dL   Bilirubin, Direct 0.3 0.1 - 0.5 mg/dL   Indirect Bilirubin 0.5 0.3 - 0.9 mg/dL  Glucose, capillary     Status: Abnormal   Collection Time: 09/25/15  8:07 AM  Result Value Ref Range   Glucose-Capillary 169 (H) 65 - 99 mg/dL   Comment 1 Notify RN   Ammonia     Status: None   Collection Time: 09/25/15  9:49 AM  Result Value Ref Range   Ammonia 24 9 - 35 umol/L  Hemoglobin A1c     Status: None   Collection Time: 09/25/15  9:49 AM  Result Value Ref Range   Hgb A1c MFr Bld 5.0 4.0 - 6.0 %  Glucose, capillary     Status: Abnormal   Collection Time: 09/25/15 11:20 AM  Result Value Ref Range   Glucose-Capillary 137 (H) 65 - 99 mg/dL   Comment 1 Notify RN   Glucose, capillary     Status: Abnormal   Collection Time: 09/25/15  3:40 PM  Result Value Ref Range   Glucose-Capillary 61 (L) 65 - 99 mg/dL   Comment 1 Notify RN   Glucose, capillary     Status: Abnormal   Collection Time: 09/25/15  3:58 PM  Result Value Ref Range   Glucose-Capillary 60 (L) 65 - 99 mg/dL  Glucose, capillary     Status: Abnormal   Collection Time: 09/25/15  4:44 PM  Result Value Ref Range   Glucose-Capillary 130 (H) 65 - 99 mg/dL   Comment 1 Document in Chart   Glucose, capillary     Status: Abnormal   Collection Time: 09/25/15  4:50 PM  Result Value Ref Range   Glucose-Capillary 120 (H) 65 - 99 mg/dL   Comment 1 Notify RN   Glucose, capillary  Status: Abnormal   Collection Time: 09/25/15  5:38 PM  Result Value Ref Range   Glucose-Capillary 121 (H) 65 - 99 mg/dL   Comment 1 Notify RN   Glucose, capillary     Status: Abnormal   Collection Time: 09/25/15  6:42 PM  Result Value Ref Range   Glucose-Capillary 140 (H) 65 - 99 mg/dL   Comment 1 Notify RN   Glucose, capillary     Status: Abnormal   Collection Time: 09/25/15  9:01 PM  Result Value Ref Range    Glucose-Capillary 105 (H) 65 - 99 mg/dL  Glucose, capillary     Status: None   Collection Time: 09/26/15  5:37 AM  Result Value Ref Range   Glucose-Capillary 93 65 - 99 mg/dL  Basic metabolic panel     Status: Abnormal   Collection Time: 09/26/15  6:24 AM  Result Value Ref Range   Sodium 136 135 - 145 mmol/L   Potassium 3.3 (L) 3.5 - 5.1 mmol/L   Chloride 110 101 - 111 mmol/L   CO2 18 (L) 22 - 32 mmol/L   Glucose, Bld 103 (H) 65 - 99 mg/dL   BUN 8 6 - 20 mg/dL   Creatinine, Ser 0.40 (L) 0.44 - 1.00 mg/dL   Calcium 8.5 (L) 8.9 - 10.3 mg/dL   GFR calc non Af Amer >60 >60 mL/min   GFR calc Af Amer >60 >60 mL/min    Comment: (NOTE) The eGFR has been calculated using the CKD EPI equation. This calculation has not been validated in all clinical situations. eGFR's persistently <60 mL/min signify possible Chronic Kidney Disease.    Anion gap 8 5 - 15  CBC     Status: Abnormal   Collection Time: 09/26/15  6:24 AM  Result Value Ref Range   WBC 8.6 3.6 - 11.0 K/uL   RBC 3.17 (L) 3.80 - 5.20 MIL/uL   Hemoglobin 9.5 (L) 12.0 - 16.0 g/dL   HCT 28.4 (L) 35.0 - 47.0 %   MCV 89.7 80.0 - 100.0 fL   MCH 30.0 26.0 - 34.0 pg   MCHC 33.5 32.0 - 36.0 g/dL   RDW 20.0 (H) 11.5 - 14.5 %   Platelets 240 150 - 440 K/uL  Glucose, capillary     Status: Abnormal   Collection Time: 09/26/15  8:18 AM  Result Value Ref Range   Glucose-Capillary 118 (H) 65 - 99 mg/dL   Comment 1 Notify RN   Glucose, capillary     Status: Abnormal   Collection Time: 09/26/15 11:18 AM  Result Value Ref Range   Glucose-Capillary 111 (H) 65 - 99 mg/dL   Comment 1 Notify RN   Glucose, capillary     Status: Abnormal   Collection Time: 09/26/15  4:39 PM  Result Value Ref Range   Glucose-Capillary 123 (H) 65 - 99 mg/dL    Current Facility-Administered Medications  Medication Dose Route Frequency Provider Last Rate Last Dose  . acetaminophen (TYLENOL) tablet 650 mg  650 mg Oral Q6H PRN Saundra Shelling, MD   650 mg at  09/25/15 1738   Or  . acetaminophen (TYLENOL) suppository 650 mg  650 mg Rectal Q6H PRN Saundra Shelling, MD      . ALPRAZolam Duanne Moron) tablet 0.25 mg  0.25 mg Oral TID Gonzella Lex, MD      . amitriptyline (ELAVIL) tablet 10 mg  10 mg Oral QHS Gonzella Lex, MD      . azithromycin (ZITHROMAX) 500 mg in  dextrose 5 % 250 mL IVPB  500 mg Intravenous Q24H Theodoro Grist, MD   500 mg at 09/26/15 1052  . dextrose 50 % solution 50 mL  50 mL Intravenous Once Theodoro Grist, MD   Stopped at 09/25/15 1645  . enoxaparin (LOVENOX) injection 40 mg  40 mg Subcutaneous Q24H Saundra Shelling, MD   40 mg at 09/25/15 2137  . feeding supplement (GLUCERNA SHAKE) (GLUCERNA SHAKE) liquid 237 mL  237 mL Oral TID WC Theodoro Grist, MD   237 mL at 09/26/15 1735  . fentaNYL (DURAGESIC - dosed mcg/hr) patch 25 mcg  25 mcg Transdermal Q72H Pavan Pyreddy, MD   25 mcg at 09/24/15 0551  . haloperidol lactate (HALDOL) injection 1 mg  1 mg Intravenous Q6H PRN Theodoro Grist, MD      . insulin aspart (novoLOG) injection 0-9 Units  0-9 Units Subcutaneous TID WC Theodoro Grist, MD   1 Units at 09/26/15 1734  . magnesium oxide (MAG-OX) tablet 400 mg  400 mg Oral BID Saundra Shelling, MD   400 mg at 09/26/15 1045  . megestrol (MEGACE) tablet 40 mg  40 mg Oral Daily Pavan Pyreddy, MD   40 mg at 09/26/15 1045  . morphine (MS CONTIN) 12 hr tablet 30 mg  30 mg Oral Q12H Pavan Pyreddy, MD   30 mg at 09/26/15 1201  . ondansetron (ZOFRAN) tablet 4 mg  4 mg Oral Q8H PRN Pavan Pyreddy, MD      . oxyCODONE (Oxy IR/ROXICODONE) immediate release tablet 7.5 mg  7.5 mg Oral Q4H PRN Theodoro Grist, MD   7.5 mg at 09/26/15 1536  . pantoprazole (PROTONIX) EC tablet 40 mg  40 mg Oral Daily Pavan Pyreddy, MD   40 mg at 09/26/15 1045  . piperacillin-tazobactam (ZOSYN) IVPB 3.375 g  3.375 g Intravenous 3 times per day Theodoro Grist, MD   3.375 g at 09/25/15 2138  . polyethylene glycol (MIRALAX / GLYCOLAX) packet 17 g  17 g Oral BID Saundra Shelling, MD   17 g at  09/25/15 0952  . potassium chloride (KLOR-CON) packet 40 mEq  40 mEq Oral BID Theodoro Grist, MD   40 mEq at 09/26/15 1051  . senna-docusate (Senokot-S) tablet 1 tablet  1 tablet Oral QHS PRN Pavan Pyreddy, MD      . sodium bicarbonate 150 mEq in sterile water 1,000 mL infusion   Intravenous Continuous Theodoro Grist, MD      . sodium chloride 0.9 % injection 3 mL  3 mL Intravenous Q12H Pavan Pyreddy, MD   3 mL at 09/26/15 1052  . vancomycin (VANCOCIN) IVPB 750 mg/150 ml premix  750 mg Intravenous Q24H Saundra Shelling, MD   750 mg at 09/25/15 1654    Musculoskeletal: Strength & Muscle Tone: decreased Gait & Station: unable to stand Patient leans: Backward  Psychiatric Specialty Exam: Review of Systems  Musculoskeletal: Positive for back pain.  Psychiatric/Behavioral: Positive for depression. The patient is nervous/anxious and has insomnia.     Blood pressure 125/69, pulse 97, temperature 99.3 F (37.4 C), temperature source Oral, resp. rate 18, height 5' 6"  (1.676 m), weight 50.8 kg (111 lb 15.9 oz), SpO2 94 %.Body mass index is 18.08 kg/(m^2).  General Appearance: Casual  Eye Contact::  Minimal  Speech:  Patient's speech alternates between perfectly clear and coherent and a few episodes where she becomes garbled when her pain spikes. She is able to regain reasonable speech soon afterwards however.  Volume:  Decreased  Mood:  Anxious and  Patient had episodes of anxiety to a remarkable degree. There were moments when she became distressed about her pain or about some other topic in the conversation when she would appear to break into sobs and her anxiety would be scum so great that she could not continue the conversation until she calmed down  Affect:  Labile  Thought Process:  Tangential  Orientation:  Full (Time, Place, and Person)  Thought Content:  Negative  Suicidal Thoughts:  No  Homicidal Thoughts:  No  Memory:  Patient refused to cooperate specifically with any testing of her memory   Judgement:  Other:  Patient's judgment probably is dependent on the level of pain she is having at the moment that she is having to make decision. If her pain can be kept under reasonable control that her agitation and anxiety should be under enough control that she can exercise appropriate judgment. I'm afraid that when she is agitated it may become less possible to have rational conversations with her because of her distress level  Insight:  Fair  Psychomotor Activity:  Decreased  Concentration:  Fair  Recall:  [employer] of Knowledge:Fair  Language: Fair  Akathisia:  No  Handed:  Right  AIMS (if indicated):     Assets:  Desire for Improvement Financial Resources/Insurance Housing Social Support  ADL's:  Impaired  Cognition: WNL  Sleep:      Treatment Plan Summary: Daily contact with patient to assess and evaluate symptoms and progress in treatment, Medication management and Plan 70 year old woman with unclear past psychiatric history however we know that she had been treated with chronic low dose Xanax and that she had seen a psychiatrist sometime in the past who is currently in a great deal of pain. There is no sign that she is having acute delirium although when she is in distress I can see where it may look like that. I think the key to trying to communicate with her is going to be addressing her pain issues and getting her anxiety and agitation settle down. To that and I recommend that first of all we increase her Xanax to 0.25 mg 3 times a day. If she can tolerate this without oversedation we may want to continue to go up if it helps. I am also going to add a low dose of 10 mg of amitriptyline at night. This may help with her sleep but also may be part of an overall plan to help with her pain. I will not address her narcotic doses but to the primary treatment team. Nursing tells me that the patient is requesting her [employer] D[employer] which she takes at home for sleep. Hospital pharmacy does not  carry that medicine but 5 mg of Ambien should be an adequate substitute. Order done. I will try to follow-up with the patient as needed after the weekend. If assistance is needed tomorrow or Sunday please call the psychiatrist on call for consults  Disposition: Patient does not meet criteria for psychiatric inpatient admission. Supportive therapy provided about ongoing stressors.  John Clapacs 09/26/2015 6:21 PM

## 2015-09-26 NOTE — Care Management Important Message (Signed)
Important Message  Patient Details  Name: [name] MRN: [id number] Date of Birth: 1946-08-24   Medicare Important Message Given:  Yes    Juliann Pulse A Gradie Ohm 09/26/2015, 8:58 AM

## 2015-09-26 NOTE — Progress Notes (Signed)
[facility] NAME: [name]    MR#:  [id number]  DATE OF BIRTH:  Jul 26, 1946  SUBJECTIVE:  CHIEF COMPLAINT:   Chief Complaint  Patient presents with  . Fatigue   patient is 70 year old Caucasian female who presents to the hospital with complaints of weakness, lethargy. On arrival to emergency room, she was noted to have elevated white blood cell count also hypotension was felt to have sepsis and was admitted to the hospital for broad-spectrum antibiotic therapy. Agitated, restless and confused today, complains of pain in the back. Fentanyl patch was placed on the 10th, to be changed today, some improved pain control with oxycodone. Remains agitated, uncooperative. Refuses MRCP. Patient is on Zosyn as well as vancomycin for healthcare associated pneumonia/aspiration pneumonitis. Speech therapist evaluated patient and recommended dysphagia. 2. Diet with thin liquids. Patient screams in pain and discomfort in the bed, not able to review systems Review of Systems  Unable to perform ROS: mental status change    VITAL SIGNS: Blood pressure 141/79, pulse 107, temperature 98.9 F (37.2 C), temperature source Oral, resp. rate 18, height 5\' 6"  (1.676 m), weight 50.8 kg (111 lb 15.9 oz), SpO2 96 %.  PHYSICAL EXAMINATION:   GENERAL:  70 y.o.-year-old patient lying in the bed in moderate distress, agitated, restless,  trying to get out from the bed, screams and discomfort. Malnourished with decreased muscle mass, chronic sick appearance EYES: Pupils equal, round, reactive to light and accommodation. No scleral icterus. Extraocular muscles intact.  HEENT: Head atraumatic, normocephalic. Oropharynx and nasopharynx clear.  NECK:  Supple, no jugular venous distention. No thyroid enlargement, no tenderness.  LUNGS: diminished breath sounds bilaterally, no wheezing, rales,rhonchi or crepitation. No use of accessory muscles of respiration.  Intermittent cough with rattling in the chest from the distance CARDIOVASCULAR: S1, S2 normal. No murmurs, rubs, or gallops.  ABDOMEN: Soft, tender in left lower quadrant with some voluntary guarding, nondistended. Bowel sounds present. No organomegaly or mass.  EXTREMITIES: No pedal edema, cyanosis, or clubbing.  NEUROLOGIC: Cranial nerves II through XII are intact. Muscle strength 5/5 in all extremities. Sensation intact. Gait not checked.  PSYCHIATRIC: The patient is alert and disoriented, confused, restless, agitated SKIN: No obvious rash, lesion, or ulcer.   ORDERS/RESULTS REVIEWED:   CBC  Recent Labs Lab 09/23/15 2233 09/24/15 0432 09/26/15 0624  WBC 17.9* 12.6* 8.6  HGB 10.3* 9.7* 9.5*  HCT 31.3* 30.8* 28.4*  PLT 240 216 240  MCV 89.9 89.8 89.7  MCH 29.6 28.4 30.0  MCHC 32.9 31.7* 33.5  RDW 20.6* 20.2* 20.0*  LYMPHSABS  --  0.5*  --   MONOABS  --  0.5  --   EOSABS  --  0.5  --   BASOSABS  --  0.1  --    ------------------------------------------------------------------------------------------------------------------  Chemistries   Recent Labs Lab 09/23/15 2233 09/24/15 0432 09/24/15 1546 09/25/15 0522 09/26/15 0624  NA 139 141  --  138 136  K 3.7 3.2* 3.3* 3.3* 3.3*  CL 113* 116*  --  116* 110  CO2 20* 18*  --  19* 18*  GLUCOSE 126* 125*  --  182* 103*  BUN 22* 19  --  11 8  CREATININE 0.64 0.43*  --  0.46 0.40*  CALCIUM 10.0 9.0  --  8.3* 8.5*  MG  --  1.7  --   --   --   AST 26 21  --  19  --  ALT 27 21  --  17  --   ALKPHOS 266* 224*  --  245*  --   BILITOT 1.1 1.3*  --  0.8  --    ------------------------------------------------------------------------------------------------------------------ estimated creatinine clearance is 54 mL/min (by C-G formula based on Cr of 0.4). ------------------------------------------------------------------------------------------------------------------ No results for input(s): TSH, T4TOTAL, T3FREE, THYROIDAB in  the last 72 hours.  Invalid input(s): FREET3  Cardiac Enzymes  Recent Labs Lab 09/23/15 2233  TROPONINI <0.03   ------------------------------------------------------------------------------------------------------------------ Invalid input(s): POCBNP ---------------------------------------------------------------------------------------------------------------  RADIOLOGY: Dg Chest Port 1 View  09/25/2015  CLINICAL DATA:  Dyspnea.  Sepsis. EXAM: PORTABLE CHEST 1 VIEW COMPARISON:  Single view of the chest 09/23/2015 P FINDINGS: There is extensive bilateral airspace disease which is new since the prior study. Heart size is normal. No pneumothorax or pleural effusion. IMPRESSION: Extensive bilateral airspace disease worrisome for pneumonia. Electronically Signed   By: Inge Rise M.D.   On: 09/25/2015 16:29    EKG:  Orders placed or performed during the hospital encounter of 09/23/15  . ED EKG  . ED EKG  . EKG 12-Lead  . EKG 12-Lead    ASSESSMENT AND PLAN:  Active Problems:   Sepsis (HCC)   Dehydration  1. Sepsis due multifocal pneumonia, healthcare associated versus aspiration pneumonitis, unlikely wound infection,  continue patient on broad-spectrum antibiotic therapy, awaiting for sputum cultures, blood cultures are negative 2. Multifocal Pneumonia, suspected aspiration pneumonitis,  speech therapist evaluation is requested, dysphagia 2 with thin liquid diet  3. Biliary dilatation, refuses to get MRCP to rule out biliary stone. Get GI involved if MRCP is positive 4. Hypokalemia, magnesium level was borderline low at 1.7 supplemented orally 5. Leukocytosis. Resolved with therapy 6. Acidosis on BMP, unremarkable ABG, patient received a bicarbonate intravenously  7. Acute urinary retention on CT scan,  Foley catheter was placed 8. Constipation, enema given, had a bowel movement 9. Delirium, acute, etiology is likely pain. Medications were advanced , . Continue Haldol as  needed. Remains intermittently agitated but I suspect pain discomfort related . The patient is incompetent to make medical decisions .Marland Kitchen Getting palliative care involved 10. Chronic pain syndrome, patient is to continue fentanyl patch now on high-dose of oxycodone, follow clinically, patient claims that MS Contin does not work, being continued on home doses of MS Contin   Management plans discussed with the patient, family and they are in agreement.   DRUG ALLERGIES:  Allergies  Allergen Reactions  . Boniva [Ibandronic Acid] Other (See Comments)    Pain neuropathy worse  . Codeine Other (See Comments)    Nervous/hyper  . Erythromycin Other (See Comments)    "Liver damage"  . Levaquin [Levofloxacin In D5w] Other (See Comments)    Hallucinations     CODE STATUS:     Code Status Orders        Start     Ordered   09/24/15 0051  Full code   Continuous     09/24/15 0051    Code Status History    Date Active Date Inactive Code Status Order ID Comments User Context   09/09/2015  6:01 AM 09/16/2015 10:28 PM Full Code [id number]  Saundra Shelling, MD ED   04/24/2015 11:39 AM 04/26/2015  1:42 PM Full Code [id number]  Corky Mull, MD Inpatient      TOTAL critiocal care TIME TAKING CARE OF THIS PATIENT: 40tes.   discussed with patient's husband extensively  Leaann Nevils M.D on 09/26/2015 at 3:52 PM  Between 7am to 6pm -  Pager - [phone]  After 6pm go to [email or web address] - password [email or web address] [facility]  [facility]  Office  [phone]  CC: Primary care physician; [name], MD

## 2015-09-26 NOTE — Progress Notes (Signed)
Patient IV access lost, attempted by two different nurses to gain access and was unsuccessful.  Paged MD she advised to call for PICC as patient will need long term antibiotics.  Called [facility] they should arrive approximately 1600.

## 2015-09-26 NOTE — Progress Notes (Signed)
Nutrition Follow-up   INTERVENTION:   Meals and Snacks: Cater to patient preferences as SLP advanced diet order to Dysphagia II Medical Food Supplement Therapy: Continue Glucerna as ordered, send room temperature as requested. Will send Magic Cup BID Coordination of Care: will continue to assess for malnutrition using guidelines from AND/ASPEN as able on follow Coordination of Care: will recommend collecting daily weights   NUTRITION DIAGNOSIS:   Increased nutrient needs related to wound healing, poor appetite, acute illness as evidenced by estimated needs.  GOAL:   Patient will meet greater than or equal to 90% of their needs; ongoing  MONITOR:    (Energy Intake, Anthropometrics, Digestive system, Electrolyte and Renal Profile, Skin)  REASON FOR ASSESSMENT:   Malnutrition Screening Tool    ASSESSMENT:   Pt admitted with sepsis secondary to infected decubitus ulcer and/or pna, possibly aspiration pna. Pt also with biliary dilation, possible MRCP scheduled. Per MD note pt with lethargy and poor po intake PTA.   Pt refusing MRCP. Pt to have PICC line placed this afternoon. Pt tearful about having another PICC placed this afternoon, on visit.  Diet Order:  DIET DYS 2 Room service appropriate?: Yes with Assist; Fluid consistency:: Thin    Current Nutrition: Pt drank 50% of glucerna this afternoon with medications per Nsg. Pt reports trying to eat some of breakfast foods this am. RD asked pt about her appetite PTA, pt reports having a good appetite and eating well PTA.   Gastrointestinal Profile: Last BM: 09/26/2015   Scheduled Medications:  . ALPRAZolam  0.25 mg Oral BID  . azithromycin  500 mg Intravenous Q24H  . dextrose  50 mL Intravenous Once  . enoxaparin (LOVENOX) injection  40 mg Subcutaneous Q24H  . feeding supplement (GLUCERNA SHAKE)  237 mL Oral TID WC  . fentaNYL  25 mcg Transdermal Q72H  . insulin aspart  0-9 Units Subcutaneous TID WC  . magnesium oxide  400  mg Oral BID  . megestrol  40 mg Oral Daily  . morphine  30 mg Oral Q12H  . pantoprazole  40 mg Oral Daily  . piperacillin-tazobactam (ZOSYN)  IV  3.375 g Intravenous 3 times per day  . polyethylene glycol  17 g Oral BID  . potassium chloride  40 mEq Oral BID  . sodium chloride  3 mL Intravenous Q12H  . vancomycin  750 mg Intravenous Q24H    Continuous Medications:  .  sodium bicarbonate 150 mEq in sterile water 1000 mL infusion       Electrolyte/Renal Profile and Glucose Profile:   Recent Labs Lab 09/24/15 0432 09/24/15 1546 09/25/15 0522 09/26/15 0624  NA 141  --  138 136  K 3.2* 3.3* 3.3* 3.3*  CL 116*  --  116* 110  CO2 18*  --  19* 18*  BUN 19  --  11 8  CREATININE 0.43*  --  0.46 0.40*  CALCIUM 9.0  --  8.3* 8.5*  MG 1.7  --   --   --   GLUCOSE 125*  --  182* 103*   Protein Profile:  Recent Labs Lab 09/23/15 2233 09/24/15 0432 09/25/15 0522  ALBUMIN 3.8 3.4* 2.6*     Nutrition-Focused Physical Exam Findings:  Unable to complete Nutrition-Focused physical exam at this time. RD attempted to complete physical assessment however pt tearful and c/o pain at present.   Weight Trend since Admission: Filed Weights   09/23/15 2219 09/25/15 0500  Weight: 95 lb (43.092 kg) 111 lb 15.9 oz (  50.8 kg)     Skin:   multiple unstageable pressure ulcers   BMI:  Body mass index is 18.08 kg/(m^2).  Estimated Nutritional Needs:   Kcal:  1636-1909kcals, BEE: 1137 kcals, TEE: (IF 1.2-1.4)(AF 1.2) using IBW of 59kg  Protein:  70-88g protein (1.2-1.5g/kg)  Fluid:  1475-1761mL of fluid (25-50mL/kg)  EDUCATION NEEDS:   No education needs identified at this time   Swain, RD, LDN Pager [phone] Weekend/On-Call Pager [phone]

## 2015-09-26 NOTE — Progress Notes (Signed)
Speech Language Pathology Treatment: Dysphagia  Patient Details Name: [name] MRN: [id number] DOB: June 29, 1946 Today's Date: 09/26/2015 Time: 3212-2482 SLP Time Calculation (min) (ACUTE ONLY): 45 min  Assessment / Plan / Recommendation Clinical Impression  Pt appears to adequately tolerate trials of thin liquids sitting EOB drinking from cup. Pt took swallows of water via cup feeding herself w/ min. Support w/ no significant decline in respiratory status and no change in vocal quality during trials. Pt took her time when sipping liquids from cup and took breaks as [place]. Pt continues to present w/ increased discomfort from back and wound pain. Positioning upright in bed or EOB will be challenging for pt sec. To her discomfort. Educated NSG and family present on importance of, and need to, do such to reduce risk for aspiration from drinking lying reclined. Encouraged pt to try to eat some foods and the trials of upgraded diet consistency (Dys. 2) during next meal - pt did not want any food trials at this session. Thorough discussion w/ family then MD re: pt's status and presentation w/ po intake currently; risk for aspiration but adequate oropharyngeal phase presentation w/ po's. Would have concern of pt's ability to adequately meet nutritional needs in her current status; Dietician following. MD agreed w/ plan for trial of upgrade of diet; precautions posted. Family and pt agreed.     HPI HPI: patient is 70 year old Caucasian female who presents to the hospital with complaints of weakness, lethargy. On arrival to emergency room, she was noted to have elevated white blood cell count also hypotension was felt to have sepsis and was admitted to the hospital for broad-spectrum antibiotic therapy. She seemed to be a little bit more alert today, however, still her review of system is not available due to significant somnolence. Patient has been coughing and expectorating yellowish sputum. Pt has a h/o severe  peripheral neuropathy leading to need for walker for last 10 years who has had a decline over the last 10 months following a fall and shoulder fx in May 2016. Had surgical repair in August. Has never recovered, largely bedbound, developing decubitus ulcers on her body. She was admitted last month with possible sepsis, severe back pain and thoracic decub ulcer with superficial cx growing MSSA. She was treated initially with IV abx then dc'ed on augmentin. ESR and CRP were nml at that time and CT did not show osteomyelitis although could not lay flat for MRI. Pt does have a h/o GERD per chart. She is taking a few po's but not much at meals per report. She does present w/ a congested cough. Pt is more alert and awake today; talkative and indicates her limitations d/t chronic pain. Requires verbal cues to redirect to task.       SLP Plan  Continue with current plan of care     Recommendations  Diet recommendations: Dysphagia 2 (fine chop);Thin liquid Liquids provided via: Cup;No straw Medication Administration: Whole meds with puree Supervision: Patient able to self feed;Staff to assist with self feeding;Full supervision/cueing for compensatory strategies Compensations: Minimize environmental distractions;Slow rate;Small sips/bites;Follow solids with liquid Postural Changes and/or Swallow Maneuvers: Out of bed for meals             Oral Care Recommendations: Oral care BID;Staff/trained caregiver to provide oral care Follow up Recommendations: Skilled Nursing facility (TBD) Plan: Continue with current plan of care     Driscoll,  MS, CCC-SLP  [name] 09/26/2015, 4:07 PM

## 2015-09-26 NOTE — Plan of Care (Signed)
Problem: Pain Managment: Goal: General experience of comfort will improve Outcome: Not Progressing Pain continues to be scored 9 or 10 out of 10     Problem: Physical Regulation: Goal: Will remain free from infection Outcome: Not Progressing Delay in PICC line placement after loss of IV access  Problem: Skin Integrity: Goal: Risk for impaired skin integrity will decrease Outcome: Progressing Low loss air mattress in place.  Allevyn dressing in place.  Problem: Activity: Goal: Risk for activity intolerance will decrease Outcome: Progressing Sat up on side of bed this shift during swallow reevaluation.

## 2015-09-26 NOTE — Care Management (Signed)
PICC line placement to day for IV ABX. PT requested from MD when appropriate.

## 2015-09-27 LAB — BASIC METABOLIC PANEL
ANION GAP: 6 (ref 5–15)
BUN: 8 mg/dL (ref 6–20)
CO2: 21 mmol/L — AB (ref 22–32)
Calcium: 8.6 mg/dL — ABNORMAL LOW (ref 8.9–10.3)
Chloride: 111 mmol/L (ref 101–111)
Creatinine, Ser: 0.31 mg/dL — ABNORMAL LOW (ref 0.44–1.00)
GFR calc Af Amer: >60 mL/min (ref >60)
GLUCOSE: 99 mg/dL (ref 65–99)
POTASSIUM: 3.1 mmol/L — AB (ref 3.5–5.1)
Sodium: 138 mmol/L (ref 135–145)

## 2015-09-27 LAB — GLUCOSE, CAPILLARY
GLUCOSE-CAPILLARY: 113 mg/dL — AB (ref 65–99)
Glucose-Capillary: 96 mg/dL (ref 65–99)
Glucose-Capillary: 110 mg/dL — ABNORMAL HIGH (ref 65–99)
Glucose-Capillary: 76 mg/dL (ref 65–99)

## 2015-09-27 LAB — VANCOMYCIN, TROUGH

## 2015-09-27 MED ORDER — VANCOMYCIN HCL 10 G IV SOLR
1250.0000 mg | Freq: Once | INTRAVENOUS | Status: AC
  Administered 2015-09-27: 1250 mg via INTRAVENOUS
  Filled 2015-09-27: qty 1250

## 2015-09-27 MED ORDER — OXYCODONE HCL 5 MG PO TABS
7.5000 mg | ORAL_TABLET | ORAL | Status: DC | PRN
Start: 2015-09-27 — End: 2015-09-28
  Administered 2015-09-27 – 2015-09-28 (×6): 7.5 mg via ORAL
  Filled 2015-09-27 (×6): qty 2

## 2015-09-27 MED ORDER — VANCOMYCIN HCL IN DEXTROSE 750-5 MG/150ML-% IV SOLN
750.0000 mg | Freq: Two times a day (BID) | INTRAVENOUS | Status: DC
  Administered 2015-09-28 – 2015-09-29 (×3): 750 mg via INTRAVENOUS
  Filled 2015-09-27 (×5): qty 150

## 2015-09-27 NOTE — Progress Notes (Signed)
Pharmacy Antibiotic Follow-up Note  [name] is a 70 y.o. year-old female admitted on 09/23/2015.  The patient is currently on day 4 of vancomycin, day 4 of Zosyn, and day 2 of Azithromycin for HCAP.  Assessment/Plan: Vancomycin trough was drawn tonight prior to the 5th dose and was subtherapeutic at <4 mcg/mL on a regimen of vancomycin 750 mg IV daily. Will give a loading dose of 1250 mg x1 followed by vancomycin 750 mg IV q12h. Vancomycin trough has been ordered for 1/16 at 2030, which is prior to the 5th dose and should represent steady state.   Continue piperacillin/tazobactam 3.375 g IV q 8 hours EI per protocol.   Temp (24hrs), Avg:98.5 F (36.9 C), Min:98.3 F (36.8 C), Max:98.7 F (37.1 C)   Recent Labs Lab 09/23/15 2233 09/24/15 0432 09/26/15 0624  WBC 17.9* 12.6* 8.6     Recent Labs Lab 09/23/15 2233 09/24/15 0432 09/25/15 0522 09/26/15 0624 09/27/15 0405  CREATININE 0.64 0.43* 0.46 0.40* 0.31*   Estimated Creatinine Clearance: 52.1 mL/min (by C-G formula based on Cr of 0.31).    Allergies  Allergen Reactions  . Boniva [Ibandronic Acid] Other (See Comments)    Pain neuropathy worse  . Codeine Other (See Comments)    Nervous/hyper  . Erythromycin Other (See Comments)    "Liver damage"  . Levaquin [Levofloxacin In D5w] Other (See Comments)    Hallucinations     Antimicrobials this admission: 1/10 Zosyn >> 1/10 Vancomycin >>  1/12 Ceftriaxone >> 1/12 1/13 Azithromycin >>  Levels/dose changes this admission: 1/14 vancomycin trough subtherapeutic at <4 mcg/mL. Increased dose to 750 mg IV q 12 hours.   Microbiology results: 12/27 BCx: NGTD 1/11 Sputum: Pseudomonas sensitive to pip/tazo 12/30 WCx: MSSA  Thank you for allowing pharmacy to be a part of this patient's care.  Darylene Price [facility] PharmD 09/27/2015 9:22 PM

## 2015-09-27 NOTE — Progress Notes (Signed)
Flutter valve given to patient with instructions. Pt. Tolerated procedure well.Non productive cough noted.

## 2015-09-27 NOTE — Progress Notes (Signed)
[name] at [place] NAME: [name]    MR#:  [id number]  DATE OF BIRTH:  Jun 19, 1946  SUBJECTIVE:  CHIEF COMPLAINT:   Chief Complaint  Patient presents with  . Fatigue   patient is 70 year old Caucasian female who presents to the hospital with complaints of weakness, lethargy. On arrival to emergency room, she was noted to have elevated white blood cell count also hypotension was felt to have sepsis and was admitted to the hospital for broad-spectrum antibiotic therapy. Agitated, restless initially, c/o pian in the back, now on advanced pain medications, feels some better, seen by psychiatrist, xanax was advanced and Ambien added as well as low dose of elavil, more comfortable today. Refuses MRCP. Patient is on Zosyn as well as vancomycin for healthcare associated pneumonia/aspiration pneumonitis. Speech therapist evaluated patient and recommended dysphagia. 2. Diet with thin liquids. Review of Systems  Unable to perform ROS: severity of pain    VITAL SIGNS: Blood pressure 145/67, pulse 91, temperature 98.7 F (37.1 C), temperature source Oral, resp. rate 16, height 5\' 6"  (1.676 m), weight 48.988 kg (108 lb), SpO2 96 %.  PHYSICAL EXAMINATION:   GENERAL:  70 y.o.-year-old patient lying in the bed in moderate distress, initially smiles, but quickly grimaces in pain, talks only about her pain and management, not able to redirect.  Malnourished with decreased muscle mass, chronic sick appearance EYES: Pupils equal, round, reactive to light and accommodation. No scleral icterus. Extraocular muscles intact.  HEENT: Head atraumatic, normocephalic. Oropharynx and nasopharynx clear.  NECK:  Supple, no jugular venous distention. No thyroid enlargement, no tenderness.  LUNGS: diminished breath sounds bilaterally, no wheezing, bilateral rhonchi , but no crepitations. No use of accessory muscles of respiration. Intermittent cough with rattling in the  chest from the distance CARDIOVASCULAR: S1, S2 normal. No murmurs, rubs, or gallops.  ABDOMEN: Soft, nontender , nondistended. Bowel sounds present. No organomegaly or mass.  EXTREMITIES: No pedal edema, cyanosis, or clubbing.  NEUROLOGIC: Cranial nerves II through XII are intact. Muscle strength 5/5 in all extremities. Sensation intact. Gait not checked.  PSYCHIATRIC: The patient is alert and oriented, but uncooperative, dramatic SKIN: thoracic area decubitus.   ORDERS/RESULTS REVIEWED:   CBC  Recent Labs Lab 09/23/15 2233 09/24/15 0432 09/26/15 0624  WBC 17.9* 12.6* 8.6  HGB 10.3* 9.7* 9.5*  HCT 31.3* 30.8* 28.4*  PLT 240 216 240  MCV 89.9 89.8 89.7  MCH 29.6 28.4 30.0  MCHC 32.9 31.7* 33.5  RDW 20.6* 20.2* 20.0*  LYMPHSABS  --  0.5*  --   MONOABS  --  0.5  --   EOSABS  --  0.5  --   BASOSABS  --  0.1  --    ------------------------------------------------------------------------------------------------------------------  Chemistries   Recent Labs Lab 09/23/15 2233 09/24/15 0432 09/24/15 1546 09/25/15 0522 09/26/15 0624 09/27/15 0405  NA 139 141  --  138 136 138  K 3.7 3.2* 3.3* 3.3* 3.3* 3.1*  CL 113* 116*  --  116* 110 111  CO2 20* 18*  --  19* 18* 21*  GLUCOSE 126* 125*  --  182* 103* 99  BUN 22* 19  --  11 8 8   CREATININE 0.64 0.43*  --  0.46 0.40* 0.31*  CALCIUM 10.0 9.0  --  8.3* 8.5* 8.6*  MG  --  1.7  --   --   --   --   AST 26 21  --  19  --   --  ALT 27 21  --  17  --   --   ALKPHOS 266* 224*  --  245*  --   --   BILITOT 1.1 1.3*  --  0.8  --   --    ------------------------------------------------------------------------------------------------------------------ estimated creatinine clearance is 52.1 mL/min (by C-G formula based on Cr of 0.31). ------------------------------------------------------------------------------------------------------------------ No results for input(s): TSH, T4TOTAL, T3FREE, THYROIDAB in the last 72  hours.  Invalid input(s): FREET3  Cardiac Enzymes  Recent Labs Lab 09/23/15 2233  TROPONINI <0.03   ------------------------------------------------------------------------------------------------------------------ Invalid input(s): POCBNP ---------------------------------------------------------------------------------------------------------------  RADIOLOGY: Dg Chest Port 1 View  09/26/2015  CLINICAL DATA:  PICC line placement. EXAM: PORTABLE CHEST 1 VIEW COMPARISON:  09/25/2015 FINDINGS: RIGHT PICC line tip in distal SVC. Normal cardiac silhouette there is bilateral upper lobe airspace disease in the RIGHT lower lobe airspace disease to lesser degree. Lungs are hyperinflated. IMPRESSION: 1. RIGHT PICC line in good position. 2. Bilateral airspace disease not changed. Electronically Signed   By: Suzy Bouchard M.D.   On: 09/26/2015 20:33   Dg Chest Port 1 View  09/25/2015  CLINICAL DATA:  Dyspnea.  Sepsis. EXAM: PORTABLE CHEST 1 VIEW COMPARISON:  Single view of the chest 09/23/2015 P FINDINGS: There is extensive bilateral airspace disease which is new since the prior study. Heart size is normal. No pneumothorax or pleural effusion. IMPRESSION: Extensive bilateral airspace disease worrisome for pneumonia. Electronically Signed   By: Inge Rise M.D.   On: 09/25/2015 16:29    EKG:  Orders placed or performed during the hospital encounter of 09/23/15  . ED EKG  . ED EKG  . EKG 12-Lead  . EKG 12-Lead    ASSESSMENT AND PLAN:  Active Problems:   Sepsis (HCC)   Dehydration   Chronic pain syndrome  1. Sepsis due multifocal pneumonia due to pseudomonas aeruginosa, healthcare associated , unlikely wound infection,  continue patient on broad-spectrum antibiotic therapy, awaiting for finalization of sputum cultures, blood cultures are negative 2. Multifocal Pneumonia due to pseudomonas aeruginosa , also  suspected aspiration pneumonitis,  speech therapist evaluation is appreciated,  continue dysphagia 2 with thin liquid diet , zosyn/vanco, sputum cx final results are pending 3. Biliary dilatation, refuses to get MRCP to rule out biliary stone, cancer?Marland Kitchen Would get GI involved if MRCP is positive 4. Hypokalemia, magnesium level was borderline low at 1.7 supplemented orally 5. Leukocytosis. Resolved with therapy 6. Acidosis on BMP, unremarkable ABG, patient received a bicarbonate intravenously , discontinued now, follow closely.  7. Acute urinary retention on CT scan,  Foley catheter was placed 8. Constipation, enema given, had a bowel movement, LBM 1.12, now on miralax, senna 9. Delirium, acute, etiology was felt to be pain/meds/infection/acidosis. Pain medications were advanced , improved overall. Patient was seen by DR Clapacs, added elavil, advanced xanax. Patient is not delirious today.  Getting palliative care involved as patient is declining overall.  10. Chronic pain syndrome, patient is to continue fentanyl patch, MSA contin , advanced oxycodone, some better clinically 11. Thoracic decubitus ulcers, continue treatment per protocol, managed by wound care center, stable   Management plans discussed with the patient, family and they are in agreement.   DRUG ALLERGIES:  Allergies  Allergen Reactions  . Boniva [Ibandronic Acid] Other (See Comments)    Pain neuropathy worse  . Codeine Other (See Comments)    Nervous/hyper  . Erythromycin Other (See Comments)    "Liver damage"  . Levaquin [Levofloxacin In D5w] Other (See Comments)    Hallucinations  CODE STATUS:     Code Status Orders        Start     Ordered   09/24/15 0051  Full code   Continuous     09/24/15 0051    Code Status History    Date Active Date Inactive Code Status Order ID Comments User Context   09/09/2015  6:01 AM 09/16/2015 10:28 PM Full Code [id number]  Saundra Shelling, MD ED   04/24/2015 11:39 AM 04/26/2015  1:42 PM Full Code [id number]  Corky Mull, MD Inpatient      TOTAL critiocal  care TIME TAKING CARE OF THIS PATIENT: 50 minutes.   Prolonged discussion  with patient, her  husband about pain management/treatment plan, time spent about 15-20  minutes on discussion Upton Russey M.D on 09/27/2015 at 3:32 PM  Between 7am to 6pm - Pager - [phone]  After 6pm go to [email or web address] - password [email or web address] [facility]  [facility]  Office  [phone]  CC: Primary care physician; [name], MD

## 2015-09-27 NOTE — Progress Notes (Signed)
Speech Therapy Note: reviewed chart noted; consulted MD re: pt's status then met w/ pt and husband in room. Pt awake, talkative and appeared stronger in voice and verbal interaction today; pt stated she felt "better" but still had pain and recently received pain meds from [place]. Noted pt reclined in bed while drinking coffee. Again discussed aspiration risks of drinking while reclined and encouraged pt to sit more upright when drinking and eating, esp. Drinking liquids, sec. to the increased risk for aspiration. Pt and husband gave verbal agreement. ST w/ f/u w/ toleration of diet next 2-3 days. Pt is currently on a Dys. 2 w/ thin liquids; no straws and aspiration precautions w/ meds in Puree w/ NSG.

## 2015-09-28 LAB — HEPATIC FUNCTION PANEL
ALT: 22 U/L (ref 14–54)
AST: 22 U/L (ref 15–41)
Albumin: 2.4 g/dL — ABNORMAL LOW (ref 3.5–5.0)
Alkaline Phosphatase: 311 U/L — ABNORMAL HIGH (ref 38–126)
BILIRUBIN DIRECT: 0.5 mg/dL (ref 0.1–0.5)
BILIRUBIN TOTAL: 3.6 mg/dL — AB (ref 0.3–1.2)
Indirect Bilirubin: 3.1 mg/dL — ABNORMAL HIGH (ref 0.3–0.9)
Total Protein: 4.7 g/dL — ABNORMAL LOW (ref 6.5–8.1)

## 2015-09-28 LAB — GLUCOSE, CAPILLARY
Glucose-Capillary: 124 mg/dL — ABNORMAL HIGH (ref 65–99)
Glucose-Capillary: 139 mg/dL — ABNORMAL HIGH (ref 65–99)
Glucose-Capillary: 167 mg/dL — ABNORMAL HIGH (ref 65–99)

## 2015-09-28 LAB — POTASSIUM: Potassium: 3.4 mmol/L — ABNORMAL LOW (ref 3.5–5.1)

## 2015-09-28 LAB — CULTURE, BLOOD (ROUTINE X 2): CULTURE: NO GROWTH

## 2015-09-28 MED ORDER — OXYCODONE HCL 5 MG PO TABS
10.0000 mg | ORAL_TABLET | ORAL | Status: DC | PRN
  Administered 2015-09-28 – 2015-09-30 (×12): 10 mg via ORAL
  Filled 2015-09-28 (×12): qty 2

## 2015-09-28 MED ORDER — FENTANYL 50 MCG/HR TD PT72
50.0000 ug | MEDICATED_PATCH | TRANSDERMAL | Status: DC
  Administered 2015-09-30: 50 ug via TRANSDERMAL
  Filled 2015-09-28: qty 1

## 2015-09-28 NOTE — Progress Notes (Signed)
Patient has been refusing MRCP, Dr Ether Griffins paged to see if order needs to be d/c'd.

## 2015-09-28 NOTE — Progress Notes (Signed)
Patient resting between care. Patient denies pain. Went to administered scheduled medications, patient states that she would like to take medication in 1 hour with PRN Ambien.

## 2015-09-28 NOTE — Progress Notes (Signed)
[place] at [place] NAME: [name]    MR#:  [id number]  DATE OF BIRTH:  1945-12-10  SUBJECTIVE:  CHIEF COMPLAINT:   Chief Complaint  Patient presents with  . Fatigue   patient is 70 year old Caucasian female who presents to the hospital with complaints of weakness, lethargy. On arrival to emergency room, she was noted to have elevated white blood cell count also hypotension was felt to have sepsis and was admitted to the hospital for broad-spectrum antibiotic therapy. Agitated, restless initially, c/o pian in the back, now on advanced pain medications, feels some better, seen by psychiatrist, xanax was advanced and Ambien added as well as low dose of elavil, more comfortable today. Refuses MRCP. Patient is on Zosyn as well as vancomycin for healthcare associated pneumonia/aspiration pneumonitis. Speech therapist evaluated patient and recommended dysphagia. 2. Diet with thin liquids. Patient still is very uncomfortable due to pain in the back. Refuses to undergo MRI  Review of Systems  Unable to perform ROS: severity of pain    VITAL SIGNS: Blood pressure 132/73, pulse 92, temperature 98.1 F (36.7 C), temperature source Oral, resp. rate 16, height 5\' 6"  (1.676 m), weight 44.906 kg (99 lb), SpO2 98 %.  PHYSICAL EXAMINATION:   GENERAL:  70 y.o.-year-old patient lying in the bed in moderate distress, attempts to cry.  Malnourished with decreased muscle mass, chronic sick appearance EYES: Pupils equal, round, reactive to light and accommodation. No scleral icterus. Extraocular muscles intact.  HEENT: Head atraumatic, normocephalic. Oropharynx and nasopharynx clear.  NECK:  Supple, no jugular venous distention. No thyroid enlargement, no tenderness.  LUNGS: diminished breath sounds bilaterally, no wheezing, bilateral rhonchi , but no crepitations. No use of accessory muscles of respiration. Intermittent cough with rattling in the chest  from the distance CARDIOVASCULAR: S1, S2 normal. No murmurs, rubs, or gallops.  ABDOMEN: Soft, nontender , nondistended. Bowel sounds present. No organomegaly or mass.  EXTREMITIES: No pedal edema, cyanosis, or clubbing.  NEUROLOGIC: Cranial nerves II through XII are intact. Muscle strength 5/5 in all extremities. Sensation intact. Gait not checked.  PSYCHIATRIC: The patient is alert and oriented, but uncooperative, dramatic SKIN: thoracic area decubitus.   ORDERS/RESULTS REVIEWED:   CBC  Recent Labs Lab 09/23/15 2233 09/24/15 0432 09/26/15 0624  WBC 17.9* 12.6* 8.6  HGB 10.3* 9.7* 9.5*  HCT 31.3* 30.8* 28.4*  PLT 240 216 240  MCV 89.9 89.8 89.7  MCH 29.6 28.4 30.0  MCHC 32.9 31.7* 33.5  RDW 20.6* 20.2* 20.0*  LYMPHSABS  --  0.5*  --   MONOABS  --  0.5  --   EOSABS  --  0.5  --   BASOSABS  --  0.1  --    ------------------------------------------------------------------------------------------------------------------  Chemistries   Recent Labs Lab 09/23/15 2233 09/24/15 0432 09/24/15 1546 09/25/15 0522 09/26/15 0624 09/27/15 0405 09/28/15 0906 09/28/15 0908  NA 139 141  --  138 136 138  --   --   K 3.7 3.2* 3.3* 3.3* 3.3* 3.1*  --  3.4*  CL 113* 116*  --  116* 110 111  --   --   CO2 20* 18*  --  19* 18* 21*  --   --   GLUCOSE 126* 125*  --  182* 103* 99  --   --   BUN 22* 19  --  11 8 8   --   --   CREATININE 0.64 0.43*  --  0.46 0.40* 0.31*  --   --  CALCIUM 10.0 9.0  --  8.3* 8.5* 8.6*  --   --   MG  --  1.7  --   --   --   --   --   --   AST 26 21  --  19  --   --  22  --   ALT 27 21  --  17  --   --  22  --   ALKPHOS 266* 224*  --  245*  --   --  311*  --   BILITOT 1.1 1.3*  --  0.8  --   --  3.6*  --    ------------------------------------------------------------------------------------------------------------------ estimated creatinine clearance is 47.7 mL/min (by C-G formula based on Cr of  0.31). ------------------------------------------------------------------------------------------------------------------ No results for input(s): TSH, T4TOTAL, T3FREE, THYROIDAB in the last 72 hours.  Invalid input(s): FREET3  Cardiac Enzymes  Recent Labs Lab 09/23/15 2233  TROPONINI <0.03   ------------------------------------------------------------------------------------------------------------------ Invalid input(s): POCBNP ---------------------------------------------------------------------------------------------------------------  RADIOLOGY: Dg Chest Port 1 View  09/26/2015  CLINICAL DATA:  PICC line placement. EXAM: PORTABLE CHEST 1 VIEW COMPARISON:  09/25/2015 FINDINGS: RIGHT PICC line tip in distal SVC. Normal cardiac silhouette there is bilateral upper lobe airspace disease in the RIGHT lower lobe airspace disease to lesser degree. Lungs are hyperinflated. IMPRESSION: 1. RIGHT PICC line in good position. 2. Bilateral airspace disease not changed. Electronically Signed   By: Suzy Bouchard M.D.   On: 09/26/2015 20:33    EKG:  Orders placed or performed during the hospital encounter of 09/23/15  . ED EKG  . ED EKG  . EKG 12-Lead  . EKG 12-Lead    ASSESSMENT AND PLAN:  Active Problems:   Sepsis (HCC)   Dehydration   Chronic pain syndrome  1. Sepsis due multifocal pneumonia due to pseudomonas aeruginosa, gram-positive cocci in chains,  ID pending , healthcare associated , unlikely wound infection,  continue patient on broad-spectrum antibiotic therapy, awaiting for finalization of sputum cultures, blood cultures are negative 2. Multifocal Pneumonia due to pseudomonas aeruginosa , gram-positive cocci in chains , also  suspected aspiration pneumonitis,  speech therapist evaluation is appreciated, continue dysphagia 2 with thin liquid diet , zosyn/vanco, sputum cx final results are pending 3. Biliary dilatation, worrisome for malignancy, refuses to get MRCP to rule out  biliary stone. Dressing pain medications fentanyl to 50 g every 3 days.  Would get GI involved if MRCP is positive and give patient allows any interventions.  4. Hypokalemia, magnesium level was borderline low at 1.7 supplemented orally 5. Leukocytosis. Resolved with therapy 6. Acidosis on BMP, unremarkable ABG, patient received a bicarbonate intravenously , discontinued now, follow in the morning.  7. Acute urinary retention on CT scan,  Foley catheter was placed 8. Constipation, enema given, had a bowel movement, LBM 1.12, now on miralax, senna 9. Delirium, acute, etiology was felt to be pain/meds/infection/acidosis. Pain medications were advanced , improved overall. Patient was seen by DR Clapacs, added elavil, advanced xanax. Patient is not delirious today.  Getting palliative care involved as patient is declining overall, not seen yet.  10. Chronic pain syndrome, patient is to continue fentanyl patch, advanced dose, MS contin , advanced oxycodone, some better clinically, watch closely for respiratory depression, placed telemetry on 11. Thoracic decubitus ulcers, continue treatment per protocol, managed by wound care center, stable   Management plans discussed with the patient, family and they are in agreement.   DRUG ALLERGIES:  Allergies  Allergen Reactions  . Boniva [Ibandronic Acid] Other (  See Comments)    Pain neuropathy worse  . Codeine Other (See Comments)    Nervous/hyper  . Erythromycin Other (See Comments)    "Liver damage"  . Levaquin [Levofloxacin In D5w] Other (See Comments)    Hallucinations     CODE STATUS:     Code Status Orders        Start     Ordered   09/24/15 0051  Full code   Continuous     09/24/15 0051    Code Status History    Date Active Date Inactive Code Status Order ID Comments User Context   09/09/2015  6:01 AM 09/16/2015 10:28 PM Full Code [id number]  Saundra Shelling, MD ED   04/24/2015 11:39 AM 04/26/2015  1:42 PM Full Code [id number]  Corky Mull, MD Inpatient      TOTAL critiocal care TIME TAKING CARE OF THIS PATIENT: 45 minutes.   Prolonged discussion  with patient, her  Husband, emotional support provided    Emira Eubanks M.D on 09/28/2015 at 3:32 PM  Between 7am to 6pm - Pager - [phone]  After 6pm go to [email or web address] - password [email or web address] [facility]  [facility] [facility]  Office  [phone]  CC: Primary care physician; [name], MD

## 2015-09-29 LAB — COMPREHENSIVE METABOLIC PANEL
ALT: 23 U/L (ref 14–54)
AST: 28 U/L (ref 15–41)
Albumin: 3 g/dL — ABNORMAL LOW (ref 3.5–5.0)
Alkaline Phosphatase: 476 U/L — ABNORMAL HIGH (ref 38–126)
Anion gap: 5 (ref 5–15)
BUN: 6 mg/dL (ref 6–20)
CALCIUM: 9.1 mg/dL (ref 8.9–10.3)
CHLORIDE: 106 mmol/L (ref 101–111)
CO2: 24 mmol/L (ref 22–32)
Creatinine, Ser: 0.49 mg/dL (ref 0.44–1.00)
Glucose, Bld: 114 mg/dL — ABNORMAL HIGH (ref 65–99)
POTASSIUM: 3.9 mmol/L (ref 3.5–5.1)
Sodium: 135 mmol/L (ref 135–145)
Total Bilirubin: 1 mg/dL (ref 0.3–1.2)
Total Protein: 5.6 g/dL — ABNORMAL LOW (ref 6.5–8.1)

## 2015-09-29 LAB — CREATININE, SERUM
CREATININE: 0.46 mg/dL (ref 0.44–1.00)
GFR calc non Af Amer: >60 mL/min (ref >60)

## 2015-09-29 LAB — GLUCOSE, CAPILLARY
GLUCOSE-CAPILLARY: 187 mg/dL — AB (ref 65–99)
Glucose-Capillary: 100 mg/dL — ABNORMAL HIGH (ref 65–99)
Glucose-Capillary: 86 mg/dL (ref 65–99)
Glucose-Capillary: 144 mg/dL — ABNORMAL HIGH (ref 65–99)

## 2015-09-29 LAB — CULTURE, RESPIRATORY: SPECIAL REQUESTS: NORMAL

## 2015-09-29 LAB — CULTURE, RESPIRATORY W GRAM STAIN

## 2015-09-29 LAB — VANCOMYCIN, TROUGH: VANCOMYCIN TR: 12 ug/mL (ref 10–20)

## 2015-09-29 MED ORDER — VANCOMYCIN HCL IN DEXTROSE 1-5 GM/200ML-% IV SOLN
1000.0000 mg | Freq: Two times a day (BID) | INTRAVENOUS | Status: DC
  Administered 2015-09-29 – 2015-09-30 (×2): 1000 mg via INTRAVENOUS
  Filled 2015-09-29 (×4): qty 200

## 2015-09-29 NOTE — Progress Notes (Signed)
[place] INFECTIOUS DISEASE PROGRESS NOTE Date of Admission:  09/23/2015     ID: [name] is a 70 y.o. female with  Decub ulcer thoracic area, Pseudomonal PNA Active Problems:   Sepsis ([place])   Dehydration   Chronic pain syndrome   Subjective: Pain improvingcing, less cough, no fevers  ROS  Eleven systems are reviewed and negative except per hpi  Medications:  Antibiotics Given (last 72 hours)    Date/Time Action Medication Dose Rate   09/26/15 2041 Given  [moved time, pending picc line placement]   vancomycin (VANCOCIN) IVPB 750 mg/150 ml premix 750 mg 150 mL/hr   09/26/15 2117 Given   piperacillin-tazobactam (ZOSYN) IVPB 3.375 g 3.375 g 12.5 mL/hr   09/27/15 0528 Given   piperacillin-tazobactam (ZOSYN) IVPB 3.375 g 3.375 g 12.5 mL/hr   09/27/15 0810 Given   azithromycin (ZITHROMAX) 500 mg in dextrose 5 % 250 mL IVPB 500 mg 250 mL/hr   09/27/15 1245 Given   piperacillin-tazobactam (ZOSYN) IVPB 3.375 g 3.375 g 12.5 mL/hr   09/27/15 2128 Given   piperacillin-tazobactam (ZOSYN) IVPB 3.375 g 3.375 g 12.5 mL/hr   09/27/15 2130 Given   vancomycin (VANCOCIN) 1,250 mg in sodium chloride 0.9 % 250 mL IVPB 1,250 mg 166.7 mL/hr   09/28/15 0512 Given   piperacillin-tazobactam (ZOSYN) IVPB 3.375 g 3.375 g 12.5 mL/hr   09/28/15 7001 Given   azithromycin (ZITHROMAX) 500 mg in dextrose 5 % 250 mL IVPB 500 mg 250 mL/hr   09/28/15 0840 Given   vancomycin (VANCOCIN) IVPB 750 mg/150 ml premix 750 mg 150 mL/hr   09/28/15 1250 Given   piperacillin-tazobactam (ZOSYN) IVPB 3.375 g 3.375 g 12.5 mL/hr   09/28/15 2154 Given   vancomycin (VANCOCIN) IVPB 750 mg/150 ml premix 750 mg 150 mL/hr   09/28/15 2155 Given   piperacillin-tazobactam (ZOSYN) IVPB 3.375 g 3.375 g 12.5 mL/hr   09/29/15 0513 Given   piperacillin-tazobactam (ZOSYN) IVPB 3.375 g 3.375 g 12.5 mL/hr   09/29/15 0828 Given   azithromycin (ZITHROMAX) 500 mg in dextrose 5 % 250 mL IVPB 500 mg 250 mL/hr   09/29/15 1114 Given    vancomycin (VANCOCIN) IVPB 750 mg/150 ml premix 750 mg 150 mL/hr   09/29/15 1417 Given   piperacillin-tazobactam (ZOSYN) IVPB 3.375 g 3.375 g 12.5 mL/hr     . ALPRAZolam  0.25 mg Oral TID  . amitriptyline  10 mg Oral QHS  . dextrose  50 mL Intravenous Once  . enoxaparin (LOVENOX) injection  40 mg Subcutaneous Q24H  . feeding supplement (GLUCERNA SHAKE)  237 mL Oral TID WC  . [START ON 09/30/2015] fentaNYL  50 mcg Transdermal Q72H  . insulin aspart  0-9 Units Subcutaneous TID WC  . magnesium oxide  400 mg Oral BID  . megestrol  40 mg Oral Daily  . morphine  30 mg Oral Q12H  . pantoprazole  40 mg Oral Daily  . piperacillin-tazobactam (ZOSYN)  IV  3.375 g Intravenous 3 times per day  . polyethylene glycol  17 g Oral BID  . potassium chloride  40 mEq Oral BID  . sodium chloride  3 mL Intravenous Q12H  . vancomycin  750 mg Intravenous Q12H    Objective: Vital signs in last 24 hours: Temp:  [98 F (36.7 C)-98.8 F (37.1 C)] 98 F (36.7 C) (01/16 1522) Pulse Rate:  [83-108] 92 (01/16 1522) Resp:  [16-18] 18 (01/16 1522) BP: (115-147)/(59-86) 115/68 mmHg (01/16 1522) SpO2:  [96 %-100 %] 98 % (01/16  1522) Weight:  [44.453 kg (98 lb)] 44.453 kg (98 lb) (01/16 0500) Constitutional:Calmer and able to converse appears very thin,  HENT: [place]/AT, PERRLA, no scleral icterus Mouth/Throat: Oropharynx is clear and dry . No oropharyngeal exudate.  Cardiovascular: Normal rate, regular rhythm and normal heart sounds.  Pulmonary/Chest: Effort normal bilateral rhonchi Neck supple, no nuchal y Abdominal: Soft. Bowel sounds are normal. exhibits no distension midl diffuse ttp Lymphadenopathy: no cervical adenopathy. No axillary adenopathy Neurological: awake and interactive but not fully oriented Skin: decub on thoraic area with shallow ulcer, mild exudate.  Psychiatri tearful   Lab Results  Recent Labs  09/27/15 0405 09/28/15 0908 09/29/15 0507 09/29/15 0836  NA 138  --   --  135  K  3.1* 3.4*  --  3.9  CL 111  --   --  106  CO2 21*  --   --  24  BUN 8  --   --  6  CREATININE 0.31*  --  0.46 0.49    Microbiology: Results for orders placed or performed during the hospital encounter of 09/23/15  Culture, blood (Routine X 2) w Reflex to ID Panel     Status: None (Preliminary result)   Collection Time: 09/23/15 11:38 PM  Result Value Ref Range Status   Specimen Description BLOOD LEFT ARM  Final   Special Requests BOTTLES DRAWN AEROBIC AND ANAEROBIC 5ML  Final   Culture NO GROWTH 4 DAYS  Final   Report Status PENDING  Incomplete  Culture, blood (Routine X 2) w Reflex to ID Panel     Status: None   Collection Time: 09/23/15 11:38 PM  Result Value Ref Range Status   Specimen Description BLOOD BLOOD RIGHT FOREARM  Final   Special Requests BOTTLES DRAWN AEROBIC AND ANAEROBIC 5ML  Final   Culture NO GROWTH 5 DAYS  Final   Report Status 09/28/2015 FINAL  Final  Culture, expectorated sputum-assessment     Status: None   Collection Time: 09/24/15  1:00 PM  Result Value Ref Range Status   Specimen Description EXPECTORATED SPUTUM  Final   Special Requests Normal  Final   Sputum evaluation THIS SPECIMEN IS ACCEPTABLE FOR SPUTUM CULTURE  Final   Report Status 09/24/2015 FINAL  Final  Culture, respiratory (NON-Expectorated)     Status: None   Collection Time: 09/24/15  1:00 PM  Result Value Ref Range Status   Specimen Description EXPECTORATED SPUTUM  Final   Special Requests Normal Reflexed from [id number]  Final   Gram Stain   Final    MANY WBC SEEN MANY GRAM POSITIVE COCCI IN CHAINS FEW YEAST RARE GRAM POSITIVE RODS EXCELLENT SPECIMEN - 90-100% WBCS    Culture LIGHT GROWTH PSEUDOMONAS AERUGINOSA  Final   Report Status 09/29/2015 FINAL  Final   Organism ID, Bacteria PSEUDOMONAS AERUGINOSA  Final      Susceptibility   Pseudomonas aeruginosa - MIC*    CEFTAZIDIME 4 SENSITIVE Sensitive     CIPROFLOXACIN <=0.25 SENSITIVE Sensitive     GENTAMICIN 2 SENSITIVE Sensitive      IMIPENEM 2 SENSITIVE Sensitive     PIP/TAZO Value in next row Sensitive      SENSITIVE8    CEFEPIME Value in next row Sensitive      SENSITIVE2    AMPICILLIN/SULBACTAM Value in next row Resistant      RESISTANT>=32    * LIGHT GROWTH PSEUDOMONAS AERUGINOSA    Studies/Results: No results found.  Assessment/Plan: AKIMA SLAUGH is a 70 y.o. female  with a history of diabetes mellitus diet controlled, hypertension, severe peripheral neuropathy leading to need for walker for last 10 years who has had a decline over the last 10 motnhs following a fall and shoulder fx in May 2016. Had surgical repair in August. Has never recovered, largely bedbound, developing decubitus ulcers. She was admitted last month with possible sepsis, severe back pain and thoracic decub ulcer with superficial cx growing MSSA. She was treated initially with IV abx then dced on augmentin. ESR and CRP were nml at that time and CT did not show osteomyelitis although could not lay flat for MRI.  She was readmitted with lethargy and confusion. WBC on admit was 17, not febrile.  Her decub ulcers are managed at wound care center. Saw Dr Con Memos last week and told per husband wounds look stableStill with back pain as well.  She seems to have had a general decline, she has lost 15-20 pounds, cannot get oob, has developed multiple decubs. I have reviewed prior MMG/ US breast in April suggesting fu in Oct given some findings of concern - this was not done due to other issues with the patient. She also has had MRI brain showing extensive WM changes.  I am concerned for an underlying malignancy a She has had CT abd with biliary dilation concerning for obsturciton but is refusing WU with MRCP or ERCP.  CXR also shows progressive infiltrate.  Speech evaluated and states :Mild aspiration risk -moderate aspiration risk I suspect currently has aspiration PNA - sputum with Pseudomonas  REC If continues to improve can change to cipro 500 bid   and doxy 100 bid and dc home. She is listed as having hallucinations with levofloxacin but the cipro is the only oral alternative for the pseudomonas.  She will need MRCP at some point to evaluate the biliary dilation  Continue to follow with wound care Stop date for both abx would be 1/24  Thank you very much for the consult. Will follow with you.  [place], [place]   09/29/2015, 4:21 PM

## 2015-09-29 NOTE — Care Management Important Message (Signed)
Important Message  Patient Details  Name: [name] MRN: [id number] Date of Birth: 1946-04-24   Medicare Important Message Given:  Yes    Juliann Pulse A Zoii Florer 09/29/2015, 9:51 AM

## 2015-09-29 NOTE — Care Management (Signed)
I have notified Arville Go that patient now has PICC and may need home ABX. Corene Cornea with [facility] also notified.

## 2015-09-29 NOTE — Therapy (Signed)
MD requested speech therapy follow up with pt for further education on positioning during intake. ST educated pt and husband present about need to sit upright at 90* for safe po intake. ST educated on further aspiration risk and precautions. Pt able to verbally retell all information and verbally agreed to all strategies provided by slp.  Husband also verbalized understanding of pt need to sit upright at 90* for safe po intake. ST answered questions regarding diet consistency and appropriate foods upon return home. Pt and husband verbalized understanding and agreement.

## 2015-09-29 NOTE — Progress Notes (Signed)
Patient sitting in room with friend/caregiver and is visably upset.  She states that her friend was told that they are concerned for cancer and she was not told.  I questioned patient and friend further and she states that doctor spoke of concern and reason for the MRI that they are trying to get done and during that conversation patient was upset and crying, appears that patient does not recall the conversation.  Paged Dr. Ether Griffins to see if she could return to discuss with patient and her schedule does not allow.  Paged Dr. [name] and he states he will come by later to talk to the patient so that she understands what he had originally tried to explain to her.  He agreed that patient was obviously in a great deal of pain and had pain medications on board that could have impacted her absorption of information.

## 2015-09-29 NOTE — Progress Notes (Signed)
Pharmacy Antibiotic Follow-up Note  [name] is a 70 y.o. year-old female admitted on 09/23/2015.  The patient is currently on day 4 of vancomycin, day 4 of Zosyn, and day 2 of Azithromycin for HCAP.  Assessment/Plan: Vancomycin trough was drawn tonight prior to the 5th dose and was subtherapeutic at <4 mcg/mL on a regimen of vancomycin 750 mg IV daily. Will give a loading dose of 1250 mg x1 followed by vancomycin 750 mg IV q12h. Vancomycin trough has been ordered for 1/16 at 2030, which is prior to the 5th dose and should represent steady state.   Continue piperacillin/tazobactam 3.375 g IV q 8 hours EI per protocol.   Temp (24hrs), Avg:98.4 F (36.9 C), Min:97.8 F (36.6 C), Max:98.8 F (37.1 C)   Recent Labs Lab 09/23/15 2233 09/24/15 0432 09/26/15 0624  WBC 17.9* 12.6* 8.6     Recent Labs Lab 09/25/15 0522 09/26/15 0624 09/27/15 0405 09/29/15 0507 09/29/15 0836  CREATININE 0.46 0.40* 0.31* 0.46 0.49   Estimated Creatinine Clearance: 47.3 mL/min (by C-G formula based on Cr of 0.49).    Allergies  Allergen Reactions  . Boniva [Ibandronic Acid] Other (See Comments)    Pain neuropathy worse  . Codeine Other (See Comments)    Nervous/hyper  . Erythromycin Other (See Comments)    "Liver damage"  . Levaquin [Levofloxacin In D5w] Other (See Comments)    Hallucinations     Antimicrobials this admission: 1/10 Zosyn >> 1/10 Vancomycin >>  1/12 Ceftriaxone >> 1/12 1/13 Azithromycin >>  Levels/dose changes this admission: 1/14 vancomycin trough subtherapeutic at <4 mcg/mL. Increased dose to 750 mg IV q 12 hours.   Microbiology results: 12/27 BCx: NGTD 1/11 Sputum: Pseudomonas sensitive to pip/tazo 12/30 WCx: MSSA  1/16:  Vancomycin trough @ 20:30 =  12 mcg/mL Will increase dose to Vancomycin 1 gm IV Q12H to start 1/16 @ 23:00. Will recheck trough before 3rd new dose on 1/17 @ 22:30.   Thank you for allowing pharmacy to be a part of this patient's  care.  Aggie Douse D PharmD 09/29/2015 10:25 PM

## 2015-09-29 NOTE — Progress Notes (Signed)
[place] [facility] [place] NAME: Clora Cerruti    MR#:  [id number]  DATE OF BIRTH:  August 20, 1946  SUBJECTIVE:  CHIEF COMPLAINT:   Chief Complaint  Patient presents with  . Fatigue   patient is 70 year old Caucasian female who presents to the hospital with complaints of weakness, lethargy. On arrival to emergency room, she was noted to have elevated white blood cell count also hypotension was felt to have sepsis and was admitted to the hospital for broad-spectrum antibiotic therapy. Agitated, restless initially, c/o pian in the back, now on advanced pain medications, feels some better, seen by psychiatrist, xanax was advanced and Ambien added as well as low dose of elavil, more comfortable today. Refuses MRCP. Patient is on Zosyn as well as vancomycin for healthcare associated pneumonia/aspiration pneumonitis. Speech therapist evaluated patient and recommended dysphagia. 2. Diet with thin liquids. Patient still is very uncomfortable due to pain in the back. Refuses to undergo MRI. Becomes distraught when learns about evaluation for cancerous condition via abdominal MRI. Dr. [name] is to discussed this with patient again  Review of Systems  Unable to perform ROS: severity of pain    VITAL SIGNS: Blood pressure 115/68, pulse 92, temperature 98 F (36.7 C), temperature source Oral, resp. rate 18, height 5\' 6"  (1.676 m), weight 44.453 kg (98 lb), SpO2 98 %.  PHYSICAL EXAMINATION:   GENERAL:  70 y.o.-year-old patient lying in the bed in no significant distress. Eats  breakfast. Comfortable, smiling  Malnourished with decreased muscle mass, chronic sick appearance EYES: Pupils equal, round, reactive to light and accommodation. No scleral icterus. Extraocular muscles intact.  HEENT: Head atraumatic, normocephalic. Oropharynx and nasopharynx clear.  NECK:  Supple, no jugular venous distention. No thyroid enlargement, no tenderness.  LUNGS: diminished  breath sounds bilaterally, no wheezing, bilateral rhonchi , but no crepitations. No use of accessory muscles of respiration. Intermittent cough with rattling in the chest from the distance CARDIOVASCULAR: S1, S2 normal. No murmurs, rubs, or gallops.  ABDOMEN: Soft, nontender , nondistended. Bowel sounds present. No organomegaly or mass.  EXTREMITIES: No pedal edema, cyanosis, or clubbing.  NEUROLOGIC: Cranial nerves II through XII are intact. Muscle strength 5/5 in all extremities. Sensation intact. Gait not checked.  PSYCHIATRIC: The patient is alert and oriented, but uncooperative, dramatic SKIN: thoracic area decubitus.   ORDERS/RESULTS REVIEWED:   CBC  Recent Labs Lab 09/23/15 2233 09/24/15 0432 09/26/15 0624  WBC 17.9* 12.6* 8.6  HGB 10.3* 9.7* 9.5*  HCT 31.3* 30.8* 28.4*  PLT 240 216 240  MCV 89.9 89.8 89.7  MCH 29.6 28.4 30.0  MCHC 32.9 31.7* 33.5  RDW 20.6* 20.2* 20.0*  LYMPHSABS  --  0.5*  --   MONOABS  --  0.5  --   EOSABS  --  0.5  --   BASOSABS  --  0.1  --    ------------------------------------------------------------------------------------------------------------------  Chemistries   Recent Labs Lab 09/23/15 2233 09/24/15 0432  09/25/15 0522 09/26/15 [id number] 09/27/15 0405 09/28/15 0906 09/28/15 0908 09/29/15 0507 09/29/15 0836  NA 139 141  --  138 136 138  --   --   --  135  K 3.7 3.2*  < > 3.3* 3.3* 3.1*  --  3.4*  --  3.9  CL 113* 116*  --  116* 110 111  --   --   --  106  CO2 20* 18*  --  19* 18* 21*  --   --   --  24  GLUCOSE 126* 125*  --  182* 103* 99  --   --   --  114*  BUN 22* 19  --  11 8 8   --   --   --  6  CREATININE 0.64 0.43*  --  0.46 0.40* 0.31*  --   --  0.46 0.49  CALCIUM 10.0 9.0  --  8.3* 8.5* 8.6*  --   --   --  9.1  MG  --  1.7  --   --   --   --   --   --   --   --   AST 26 21  --  19  --   --  22  --   --  28  ALT 27 21  --  17  --   --  22  --   --  23  ALKPHOS 266* 224*  --  245*  --   --  311*  --   --  476*  BILITOT 1.1  1.3*  --  0.8  --   --  3.6*  --   --  1.0  < > = values in this interval not displayed. ------------------------------------------------------------------------------------------------------------------ estimated creatinine clearance is 47.3 mL/min (by C-G formula based on Cr of 0.49). ------------------------------------------------------------------------------------------------------------------ No results for input(s): TSH, T4TOTAL, T3FREE, THYROIDAB in the last 72 hours.  Invalid input(s): FREET3  Cardiac Enzymes  Recent Labs Lab 09/23/15 2233  TROPONINI <0.03   ------------------------------------------------------------------------------------------------------------------ Invalid input(s): POCBNP ---------------------------------------------------------------------------------------------------------------  RADIOLOGY: No results found.  EKG:  Orders placed or performed during the hospital encounter of 09/23/15  . ED EKG  . ED EKG  . EKG 12-Lead  . EKG 12-Lead    ASSESSMENT AND PLAN:  Active Problems:   Sepsis ([facility])   Dehydration   Chronic pain syndrome  1. Sepsis due multifocal pneumonia due to pseudomonas aeruginosa, gram-positive cocci in chains,  ID pending , healthcare associated , unlikely wound infection,  continue patient on broad-spectrum antibiotic therapy with vancomycin and Zosyn, awaiting for finalization of sputum cultures, blood cultures are negative 2. Multifocal Pneumonia due to pseudomonas aeruginosa , gram-positive cocci in chains , also  suspected aspiration pneumonitis,  speech therapist evaluation is appreciated, continue dysphagia 2 with thin liquid diet , zosyn/vanco, sputum cx final results are pending, infectious disease specialist is involved, place PICC line if needed 3. Biliary dilatation, worrisome for malignancy, refuses to get MRCP to rule out biliary stone. Advanced pain medications: fentanyl to 50 g every 3 days.  Would get GI involved  if MRCP is positive.  4. Hypokalemia, resolved, magnesium level was borderline low at 1.7 supplemented orally 5. Leukocytosis. Resolved with therapy 6. Acidosis on BMP, unremarkable ABG, patient received a bicarbonate intravenously , discontinued now, stable, normal now  7. Acute urinary retention on CT scan,  Foley catheter was placed, discontinue Foley 8. Constipation, enema given, had a bowel movement, LBM 1.15, now on miralax, senna 9. Delirium, acute, etiology was felt to be pain/meds/infection/acidosis. Pain medications were advanced , improved overall. Patient was seen by DR Clapacs, added elavil, advanced xanax. Patient is not delirious today.  Would benefit from palliative care involvement, as patient is declining overall, not seen yet.  10. Chronic pain syndrome, patient is to continue fentanyl patch, advanced dose, MS contin , advanced oxycodone, some better clinically, watch closely for respiratory depression, placed telemetry on 11. Thoracic decubitus ulcers, continue treatment per protocol, managed by wound care center, stable   Management  plans discussed with the patient, family and they are in agreement.   DRUG ALLERGIES:  Allergies  Allergen Reactions  . Boniva [Ibandronic Acid] Other (See Comments)    Pain neuropathy worse  . Codeine Other (See Comments)    Nervous/hyper  . Erythromycin Other (See Comments)    "Liver damage"  . Levaquin [Levofloxacin In D5w] Other (See Comments)    Hallucinations     CODE STATUS:     Code Status Orders        Start     Ordered   09/24/15 0051  Full code   Continuous     09/24/15 0051    Code Status History    Date Active Date Inactive Code Status Order ID Comments User Context   09/09/2015  6:01 AM 09/16/2015 10:28 PM Full Code [id number]  Saundra Shelling, MD ED   04/24/2015 11:39 AM 04/26/2015  1:42 PM Full Code [id number]  Corky Mull, MD Inpatient      TOTAL critiocal care TIME TAKING CARE OF THIS PATIENT: 45 minutes.    Prolonged discussion  today again with the patient, her  Husband, emotional support provided. Very clearly discussed concerns about biliary duct dilatation, including stone,  malignancy, stenosis, explained that biliary ductal dilatation could be the cause of her pain, only to be called later in the day that the patient is distraught learning that malignancy is a consideration     Leonard Hendler M.D on 09/29/2015 at 4:28 PM  Between 7am to 6pm - Pager - [phone]  After 6pm go to [email or web address] - password [email or web address] [facility]  [facility]  Office  [phone]  CC: Primary care physician; [name], MD

## 2015-09-30 LAB — GLUCOSE, CAPILLARY
Glucose-Capillary: 127 mg/dL — ABNORMAL HIGH (ref 65–99)
Glucose-Capillary: 139 mg/dL — ABNORMAL HIGH (ref 65–99)

## 2015-09-30 LAB — COMPREHENSIVE METABOLIC PANEL
ALK PHOS: 384 U/L — AB (ref 38–126)
ALT: 18 U/L (ref 14–54)
ANION GAP: 8 (ref 5–15)
AST: 18 U/L (ref 15–41)
Albumin: 2.9 g/dL — ABNORMAL LOW (ref 3.5–5.0)
BILIRUBIN TOTAL: 1.3 mg/dL — AB (ref 0.3–1.2)
BUN: 8 mg/dL (ref 6–20)
CALCIUM: 9 mg/dL (ref 8.9–10.3)
CO2: 24 mmol/L (ref 22–32)
CREATININE: 0.51 mg/dL (ref 0.44–1.00)
Chloride: 102 mmol/L (ref 101–111)
GFR calc non Af Amer: >60 mL/min (ref >60)
Glucose, Bld: 159 mg/dL — ABNORMAL HIGH (ref 65–99)
Potassium: 3.6 mmol/L (ref 3.5–5.1)
SODIUM: 134 mmol/L — AB (ref 135–145)
TOTAL PROTEIN: 5.6 g/dL — AB (ref 6.5–8.1)

## 2015-09-30 LAB — CULTURE, BLOOD (ROUTINE X 2): Culture: NO GROWTH

## 2015-09-30 MED ORDER — FENTANYL 50 MCG/HR TD PT72: 50.0000 ug | MEDICATED_PATCH | TRANSDERMAL | Status: AC

## 2015-09-30 MED ORDER — OXYCODONE HCL 10 MG PO TABS: 10.0000 mg | ORAL_TABLET | ORAL | Status: AC | PRN

## 2015-09-30 MED ORDER — DOXYCYCLINE HYCLATE 50 MG PO CAPS: 50.0000 mg | ORAL_CAPSULE | Freq: Two times a day (BID) | ORAL | Status: AC

## 2015-09-30 MED ORDER — CIPROFLOXACIN HCL 500 MG PO TABS: 500.0000 mg | ORAL_TABLET | Freq: Two times a day (BID) | ORAL | Status: AC

## 2015-09-30 NOTE — Progress Notes (Signed)
Completed discharge instructions with patient and spouse, no complaints or questions.

## 2015-09-30 NOTE — Discharge Summary (Signed)
[name] NAME: [name]    MR#:  [id number]  DATE OF BIRTH:  November 17, 1945  DATE OF ADMISSION:  09/23/2015 ADMITTING PHYSICIAN: Saundra Shelling, MD  DATE OF DISCHARGE: 09/30/2015  3:33 PM  PRIMARY CARE PHYSICIAN: MCLAUGHLIN, MIRIAM K, MD    ADMISSION DIAGNOSIS:  Dehydration [E86.0] Lethargic [R53.83] Sepsis, due to unspecified organism ([place]) [A41.9]  DISCHARGE DIAGNOSIS:  Active Problems:   Sepsis ([place])   Dehydration   Chronic pain syndrome   SECONDARY DIAGNOSIS:   Past Medical History  Diagnosis Date  . Diabetes mellitus without complication ([place])   . Hypertension   . Neuropathy ([place])   . GERD (gastroesophageal reflux disease)   . Hypothyroidism   . Breast cancer ([place])     breast-left  . Bed sore on heel   . Bed sore on elbow     HOSPITAL COURSE:   71 year old female with past medical history of diabetes, hypertension, neuropathy, GERD, hypothyroidism, breast cancer, recent fall status post thoracic and lower extremity pressure sores who presented to the hospital due to weakness lethargy and was noted to have a elevated white cell count and noted to be hypotensive and thought to have sepsis.  #1 sepsis-this was secondary to multifocal pneumonia as noted on the chest x-ray also noted on the CT scan of the abdomen and pelvis. Patient's sputum culture was positive for Pseudomonas. Initially patient was treated with broad-spectrum IV antibiotics with vancomycin and Zosyn. -Infectious disease consult was also obtained.  After the evaluation and since patient is clinically improved she was discharged on oral ciprofloxacin and his doxycycline. Maintained afebrile and hemodynamically stable over the past 48 hours.  #2 multifocal pneumonia-this was secondary to Pseudomonas and thought to be aspiration in nature. -Patient was seen by speech therapy started on a dysphagia 2 diet and has tolerated it well. Her sputum  cultures as mentioned did grow out Pseudomonas initially she was treated with IV antibiotics with Zosyn, vancomycin and now being discharged on oral Cipro and doxycycline as per infectious disease.  #3 history of chronic pain syndrome-patient is already on MS Contin which she will continue. She was also started on a fentanyl patch which was advanced and also on oxycodone as needed. Her pain is improved. Her chronic pain is from her fall and shoulder pain.  #4 thoracic decubitus ulcers-this was secondary to her poor mobility status from her chronic pain. Patient was seen by wound team and will continue follow-up at the wound center as outpatient.  #5 Hypokalemia - supplemented and since then resolved.   #6  Biliary Dilatation -patient was noted to have a dilated common bile duct on the CT scan of abdomen pelvis. A MRCP was recommended but the patient could not lie flat given her thoracic Back ulcers and chronic pain.  Heart LFTs and bilirubins are stable. There is some suspicion that could be underlying malignancy and therefore this needs to be done as an outpatient and is to be arranged through her primary care physician. The patient and her husband were in agreement with this.  #7 acute urinary retention-patient had a Foley catheter placed while in the hospital but was intermittent and was shortly thereafter removed and she has been able to void prior to discharge.  #8 GERD-patient will continue her Prevacid.  Patient is being discharged home with home health nursing, PT/OT services.  DISCHARGE CONDITIONS:   Stable  CONSULTS OBTAINED:  Treatment Team:  Adrian Prows, MD  Gonzella Lex, MD  DRUG ALLERGIES:   Allergies  Allergen Reactions  . Boniva [Ibandronic Acid] Other (See Comments)    Pain neuropathy worse  . Codeine Other (See Comments)    Nervous/hyper  . Erythromycin Other (See Comments)    "Liver damage"  . Levaquin [Levofloxacin In D5w] Other (See Comments)     Hallucinations     DISCHARGE MEDICATIONS:   Discharge Medication List as of 09/30/2015  1:08 PM    START taking these medications   Details  ciprofloxacin (CIPRO) 500 MG tablet Take 1 tablet (500 mg total) by mouth 2 (two) times daily., Starting 09/30/2015, Until Tue 10/07/15, Print    doxycycline (VIBRAMYCIN) 50 MG capsule Take 1 capsule (50 mg total) by mouth 2 (two) times daily., Starting 09/30/2015, Until Tue 10/07/15, Print    !! oxyCODONE 10 MG TABS Take 1 tablet (10 mg total) by mouth every 3 (three) hours as needed for severe pain or breakthrough pain., Starting 09/30/2015, Until Discontinued, Print     !! - Potential duplicate medications found. Please discuss with provider.    CONTINUE these medications which have CHANGED   Details  fentaNYL (DURAGESIC - DOSED MCG/HR) 50 MCG/HR Place 1 patch (50 mcg total) onto the skin every 3 (three) days., Starting 09/30/2015, Until Discontinued, Print      CONTINUE these medications which have NOT CHANGED   Details  ALPRAZolam (XANAX) 0.25 MG tablet Take 0.25 mg by mouth 2 (two) times daily., Until Discontinued, Historical Med    feeding supplement, GLUCERNA SHAKE, (GLUCERNA SHAKE) LIQD Take 237 mLs by mouth 2 (two) times daily between meals., Starting 09/16/2015, Until Discontinued, Normal    fexofenadine (ALLEGRA) 180 MG tablet Take 180 mg by mouth daily as needed for allergies. , Until Discontinued, Historical Med    gabapentin (NEURONTIN) 400 MG capsule Take 400 mg by mouth 3 (three) times daily., Until Discontinued, Historical Med    lansoprazole (PREVACID) 30 MG capsule Take 30 mg by mouth daily at 12 noon., Until Discontinued, Historical Med    magnesium oxide (MAG-OX) 400 (241.3 Mg) MG tablet Take 1 tablet (400 mg total) by mouth 2 (two) times daily., Starting 09/16/2015, Until Discontinued, Normal    megestrol (MEGACE) 40 MG tablet Take 1 tablet (40 mg total) by mouth daily., Starting 09/16/2015, Until Discontinued, Normal     mometasone (NASONEX) 50 MCG/ACT nasal spray Place 2 sprays into the nose daily as needed (allergies). , Until Discontinued, Historical Med    morphine (MS CONTIN) 30 MG 12 hr tablet Take 1 tablet (30 mg total) by mouth every 12 (twelve) hours., Starting 09/16/2015, Until Discontinued, Print    olopatadine (PATANOL) 0.1 % ophthalmic solution Place 1 drop into both eyes 2 (two) times daily as needed for allergies. , Until Discontinued, Historical Med    ondansetron (ZOFRAN) 4 MG tablet Take 1 tablet (4 mg total) by mouth every 8 (eight) hours as needed for nausea., Starting 09/16/2015, Until Discontinued, Normal    !! oxyCODONE (OXY IR/ROXICODONE) 5 MG immediate release tablet Take 1 tablet (5 mg total) by mouth every 4 (four) hours as needed for breakthrough pain., Starting 09/16/2015, Until Discontinued, Print    polyethylene glycol (MIRALAX / GLYCOLAX) packet Take 17 g by mouth 2 (two) times daily., Starting 09/16/2015, Until Discontinued, Normal    potassium chloride 20 MEQ TBCR Take 10 mEq by mouth 2 (two) times daily., Starting 09/16/2015, Until Discontinued, Normal    senna-docusate (SENOKOT-S) 8.6-50 MG tablet Take 1 tablet by  mouth at bedtime as needed for mild constipation., Starting 09/16/2015, Until Discontinued, Normal    zaleplon (SONATA) 10 MG capsule Take 10 mg by mouth at bedtime as needed for sleep., Until Discontinued, Historical Med     !! - Potential duplicate medications found. Please discuss with provider.    STOP taking these medications     cephALEXin (KEFLEX) 500 MG capsule      fentaNYL (DURAGESIC - DOSED MCG/HR) 25 MCG/HR patch          DISCHARGE INSTRUCTIONS:   DIET:  Regular diet  DISCHARGE CONDITION:  Stable  ACTIVITY:  Activity as tolerated  OXYGEN:  Home Oxygen: No.   Oxygen Delivery: room air  DISCHARGE LOCATION:  Home with [place], PT/OT   If you experience worsening of your admission symptoms, develop shortness of breath, life  threatening emergency, suicidal or homicidal thoughts you must seek medical attention immediately by calling 911 or calling your MD immediately  if symptoms less severe.  You Must read complete instructions/literature along with all the possible adverse reactions/side effects for all the Medicines you take and that have been prescribed to you. Take any new Medicines after you have completely understood and accpet all the possible adverse reactions/side effects.   Please note  You were cared for by a hospitalist during your hospital stay. If you have any questions about your discharge medications or the care you received while you were in the hospital after you are discharged, you can call the unit and asked to speak with the hospitalist on call if the hospitalist that took care of you is not available. Once you are discharged, your primary care physician will handle any further medical issues. Please note that NO REFILLS for any discharge medications will be authorized once you are discharged, as it is imperative that you return to your primary care physician (or establish a relationship with a primary care physician if you do not have one) for your aftercare needs so that they can reassess your need for medications and monitor your lab values.     Today   Back pain has improved.  No N/V or abdominal pain.   VITAL SIGNS:  Blood pressure 152/74, pulse 96, temperature 97.8 F (36.6 C), temperature source Oral, resp. rate 16, height 5\' 6"  (1.676 m), weight 44.906 kg (99 lb), SpO2 100 %.  I/O:   Intake/Output Summary (Last 24 hours) at 09/30/15 1645 Last data filed at 09/30/15 0926  Gross per 24 hour  Intake 1298.33 ml  Output      0 ml  Net 1298.33 ml    PHYSICAL EXAMINATION:  GENERAL:  70 y.o.-year-old cachectic patient lying in the bed in NAD.   EYES: Pupils equal, round, reactive to light and accommodation. No scleral icterus. Extraocular muscles intact.  HEENT: Head atraumatic,  normocephalic. Oropharynx and nasopharynx clear.  NECK:  Supple, no jugular venous distention. No thyroid enlargement, no tenderness.  LUNGS: Normal breath sounds bilaterally, no wheezing, rales,rhonchi. No use of accessory muscles of respiration.  CARDIOVASCULAR: S1, S2 normal. No murmurs, rubs, or gallops.  ABDOMEN: Soft, non-tender, non-distended. Bowel sounds present. No organomegaly or mass.  EXTREMITIES: No pedal edema, cyanosis, or clubbing.  NEUROLOGIC: Cranial nerves II through XII are intact. No focal motor or sensory defecits b/l.  PSYCHIATRIC: The patient is alert and oriented x 3. Good affect.  SKIN: No obvious rash, lesion, + Thoracic decubitus ulcer.    DATA REVIEW:   CBC  Recent Labs Lab 09/26/15  0624  WBC 8.6  HGB 9.5*  HCT 28.4*  PLT 240    Chemistries   Recent Labs Lab 09/24/15 0432  09/30/15 0505  NA 141  < > 134*  K 3.2*  < > 3.6  CL 116*  < > 102  CO2 18*  < > 24  GLUCOSE 125*  < > 159*  BUN 19  < > 8  CREATININE 0.43*  < > 0.51  CALCIUM 9.0  < > 9.0  MG 1.7  --   --   AST 21  < > 18  ALT 21  < > 18  ALKPHOS 224*  < > 384*  BILITOT 1.3*  < > 1.3*  < > = values in this interval not displayed.  Cardiac Enzymes  Recent Labs Lab 09/23/15 2233  TROPONINI <0.03    Microbiology Results  Results for orders placed or performed during the hospital encounter of 09/23/15  Culture, blood (Routine X 2) w Reflex to ID Panel     Status: None   Collection Time: 09/23/15 11:38 PM  Result Value Ref Range Status   Specimen Description BLOOD LEFT ARM  Final   Special Requests BOTTLES DRAWN AEROBIC AND ANAEROBIC 5ML  Final   Culture NO GROWTH 6 DAYS  Final   Report Status 09/30/2015 FINAL  Final  Culture, blood (Routine X 2) w Reflex to ID Panel     Status: None   Collection Time: 09/23/15 11:38 PM  Result Value Ref Range Status   Specimen Description BLOOD BLOOD RIGHT FOREARM  Final   Special Requests BOTTLES DRAWN AEROBIC AND ANAEROBIC 5ML  Final    Culture NO GROWTH 5 DAYS  Final   Report Status 09/28/2015 FINAL  Final  Culture, expectorated sputum-assessment     Status: None   Collection Time: 09/24/15  1:00 PM  Result Value Ref Range Status   Specimen Description EXPECTORATED SPUTUM  Final   Special Requests Normal  Final   Sputum evaluation THIS SPECIMEN IS ACCEPTABLE FOR SPUTUM CULTURE  Final   Report Status 09/24/2015 FINAL  Final  Culture, respiratory (NON-Expectorated)     Status: None   Collection Time: 09/24/15  1:00 PM  Result Value Ref Range Status   Specimen Description EXPECTORATED SPUTUM  Final   Special Requests Normal Reflexed from [id number]  Final   Gram Stain   Final    MANY WBC SEEN MANY GRAM POSITIVE COCCI IN CHAINS FEW YEAST RARE GRAM POSITIVE RODS EXCELLENT SPECIMEN - 90-100% WBCS    Culture LIGHT GROWTH PSEUDOMONAS AERUGINOSA  Final   Report Status 09/29/2015 FINAL  Final   Organism ID, Bacteria PSEUDOMONAS AERUGINOSA  Final      Susceptibility   Pseudomonas aeruginosa - MIC*    CEFTAZIDIME 4 SENSITIVE Sensitive     CIPROFLOXACIN <=0.25 SENSITIVE Sensitive     GENTAMICIN 2 SENSITIVE Sensitive     IMIPENEM 2 SENSITIVE Sensitive     PIP/TAZO Value in next row Sensitive      SENSITIVE8    CEFEPIME Value in next row Sensitive      SENSITIVE2    AMPICILLIN/SULBACTAM Value in next row Resistant      RESISTANT>=32    * LIGHT GROWTH PSEUDOMONAS AERUGINOSA    RADIOLOGY:  No results found.    Management plans discussed with the patient, family and they are in agreement.  CODE STATUS:     Code Status Orders        Start     Ordered  09/24/15 0051  Full code   Continuous     09/24/15 0051    Code Status History    Date Active Date Inactive Code Status Order ID Comments User Context   09/09/2015  6:01 AM 09/16/2015 10:28 PM Full Code [id number]  Saundra Shelling, MD ED   04/24/2015 11:39 AM 04/26/2015  1:42 PM Full Code [id number]  Corky Mull, MD Inpatient      TOTAL TIME TAKING CARE OF THIS  PATIENT: 40 minutes.    Henreitta Leber M.D on 09/30/2015 at 4:45 PM  Between 7am to 6pm - Pager - [phone]  After 6pm go to [email or web address] - password [email or web address] [facility]  [facility]  Office  [phone]  CC: Primary care physician; [name], MD

## 2015-09-30 NOTE — Progress Notes (Signed)
Spoke with Dr. Marcille Blanco pt may come off of Telemetry

## 2015-09-30 NOTE — Care Management (Signed)
Patient discharging home today followed by [facility]. Per ID patient will not need IV antibiotics at discharge. I have notified [facility] of patient discharge and [facility] to cancel IV infusion. Spoke with husband who is currently at bedside. He agrees with discharge and again states that patient can ride in a car to home. No further RNCM needs. Case closed.

## 2015-10-01 LAB — GLUCOSE, CAPILLARY: Glucose-Capillary: 109 mg/dL — ABNORMAL HIGH (ref 65–99)

## 2015-10-03 ENCOUNTER — Encounter: Payer: Medicare Other | Admitting: Surgery

## 2015-10-03 DIAGNOSIS — E1140 Type 2 diabetes mellitus with diabetic neuropathy, unspecified: Secondary | ICD-10-CM | POA: Diagnosis not present

## 2015-10-03 DIAGNOSIS — E1165 Type 2 diabetes mellitus with hyperglycemia: Secondary | ICD-10-CM | POA: Diagnosis not present

## 2015-10-03 DIAGNOSIS — I10 Essential (primary) hypertension: Secondary | ICD-10-CM | POA: Diagnosis not present

## 2015-10-03 DIAGNOSIS — L89613 Pressure ulcer of right heel, stage 3: Secondary | ICD-10-CM | POA: Diagnosis not present

## 2015-10-03 DIAGNOSIS — M810 Age-related osteoporosis without current pathological fracture: Secondary | ICD-10-CM | POA: Diagnosis not present

## 2015-10-03 DIAGNOSIS — F17200 Nicotine dependence, unspecified, uncomplicated: Secondary | ICD-10-CM | POA: Diagnosis not present

## 2015-10-03 DIAGNOSIS — L89100 Pressure ulcer of unspecified part of back, unstageable: Secondary | ICD-10-CM | POA: Diagnosis not present

## 2015-10-03 DIAGNOSIS — Z853 Personal history of malignant neoplasm of breast: Secondary | ICD-10-CM | POA: Diagnosis not present

## 2015-10-03 DIAGNOSIS — F1021 Alcohol dependence, in remission: Secondary | ICD-10-CM | POA: Diagnosis not present

## 2015-10-03 DIAGNOSIS — G629 Polyneuropathy, unspecified: Secondary | ICD-10-CM | POA: Diagnosis not present

## 2015-10-03 DIAGNOSIS — E11621 Type 2 diabetes mellitus with foot ulcer: Secondary | ICD-10-CM | POA: Diagnosis not present

## 2015-10-03 DIAGNOSIS — L89213 Pressure ulcer of right hip, stage 3: Secondary | ICD-10-CM | POA: Diagnosis not present

## 2015-10-04 NOTE — Progress Notes (Signed)
RELEFORD, Aften H. ([id number]) Visit Report for 10/03/2015 Arrival Information Details Patient Name: [name], [name] Date of Service: 10/03/2015 3:30 PM Medical Record Number: [id number] Patient Account Number: [id number] Date of Birth/Sex: 26-Apr-1946 (69 y.o. Female) Treating RN: Montey Hora Primary Care Physician: Paulita Cradle Other Clinician: Referring Physician: Paulita Cradle Treating Physician/Extender: Frann Rider in Treatment: 29 Visit Information History Since Last Visit Added or deleted any medications: No Patient Arrived: Walker Any new allergies or adverse reactions: No Arrival Time: 15:37 Had a fall or experienced change in No Accompanied By: spouse activities of daily living that may affect Transfer Assistance: Manual risk of falls: Patient Identification Verified: Yes Signs or symptoms of abuse/neglect since last No Secondary Verification Process Completed: Yes visito Patient Requires Transmission-Based No Hospitalized since last visit: No Precautions: Pain Present Now: No Patient Has Alerts: No Electronic Signature(s) Signed: 10/03/2015 6:12:53 PM By: Montey Hora Entered By: Montey Hora on 10/03/2015 15:38:34 [name], [name] ([id number]) -------------------------------------------------------------------------------- Clinic Level of Care Assessment Details Patient Name: [name], [name] Date of Service: 10/03/2015 3:30 PM Medical Record Number: [id number] Patient Account Number: [id number] Date of Birth/Sex: [birth year] (69 y.o. Female) Treating RN: Montey Hora Primary Care Physician: Paulita Cradle Other Clinician: Referring Physician: Paulita Cradle Treating Physician/Extender: Frann Rider in Treatment: 29 Clinic Level of Care Assessment Items TOOL 4 Quantity Score []  - Use when only an EandM is performed on FOLLOW-UP visit 0 ASSESSMENTS - Nursing Assessment / Reassessment X - Reassessment of Co-morbidities  (includes updates in patient status) 1 10 X - Reassessment of Adherence to Treatment Plan 1 5 ASSESSMENTS - Wound and Skin Assessment / Reassessment []  - Simple Wound Assessment / Reassessment - one wound 0 X - Complex Wound Assessment / Reassessment - multiple wounds 6 5 []  - Dermatologic / Skin Assessment (not related to wound area) 0 ASSESSMENTS - Focused Assessment []  - Circumferential Edema Measurements - multi extremities 0 []  - Nutritional Assessment / Counseling / Intervention 0 []  - Lower Extremity Assessment (monofilament, tuning fork, pulses) 0 []  - Peripheral Arterial Disease Assessment (using hand held doppler) 0 ASSESSMENTS - Ostomy and/or Continence Assessment and Care []  - Incontinence Assessment and Management 0 []  - Ostomy Care Assessment and Management (repouching, etc.) 0 PROCESS - Coordination of Care X - Simple Patient / Family Education for ongoing care 1 15 []  - Complex (extensive) Patient / Family Education for ongoing care 0 []  - Staff obtains Programmer, systems, Records, Test Results / Process Orders 0 []  - Staff telephones HHA, Nursing Homes / Clarify orders / etc 0 []  - Routine Transfer to another Facility (non-emergent condition) 0 [name], [name] ([id number]) []  - Routine Hospital Admission (non-emergent condition) 0 []  - New Admissions / Biomedical engineer / Ordering NPWT, Apligraf, etc. 0 []  - Emergency Hospital Admission (emergent condition) 0 X - Simple Discharge Coordination 1 10 []  - Complex (extensive) Discharge Coordination 0 PROCESS - Special Needs []  - Pediatric / Minor Patient Management 0 []  - Isolation Patient Management 0 []  - Hearing / Language / Visual special needs 0 []  - Assessment of Community assistance (transportation, D/C planning, etc.) 0 []  - Additional assistance / Altered mentation 0 []  - Support Surface(s) Assessment (bed, cushion, seat, etc.) 0 INTERVENTIONS - Wound Cleansing / Measurement []  - Simple Wound Cleansing - one wound  0 X - Complex Wound Cleansing - multiple wounds 6 5 X - Wound Imaging (photographs - any number of wounds) 1 5 []  - Wound Tracing (instead of photographs) 0 []  -  Simple Wound Measurement - one wound 0 X - Complex Wound Measurement - multiple wounds 6 5 INTERVENTIONS - Wound Dressings X - Small Wound Dressing one or multiple wounds 6 10 []  - Medium Wound Dressing one or multiple wounds 0 []  - Large Wound Dressing one or multiple wounds 0 []  - Application of Medications - topical 0 []  - Application of Medications - injection 0 INTERVENTIONS - Miscellaneous []  - External ear exam 0 [name], [name] ([id number]) []  - Specimen Collection (cultures, biopsies, blood, body fluids, etc.) 0 []  - Specimen(s) / Culture(s) sent or taken to Lab for analysis 0 []  - Patient Transfer (multiple staff / [name] / Similar devices) 0 []  - Simple Staple / Suture removal (25 or less) 0 []  - Complex Staple / Suture removal (26 or more) 0 []  - Hypo / Hyperglycemic Management (close monitor of Blood Glucose) 0 []  - Ankle / Brachial Index (ABI) - do not check if billed separately 0 X - Vital Signs 1 5 Has the patient been seen at the hospital within the last three years: Yes Total Score: 200 Level Of Care: New/Established - Level 5 Electronic Signature(s) Signed: 10/03/2015 5:09:34 PM By: Montey Hora Entered By: Montey Hora on 10/03/2015 17:09:34 [name], [name] ([id number]) -------------------------------------------------------------------------------- Encounter Discharge Information Details Patient Name: [name], [name] Date of Service: 10/03/2015 3:30 PM Medical Record Number: [id number] Patient Account Number: [id number] Date of Birth/Sex: 23-Mar-1946 (69 y.o. Female) Treating RN: Montey Hora Primary Care Physician: Paulita Cradle Other Clinician: Referring Physician: Paulita Cradle Treating Physician/Extender: Frann Rider in Treatment: 36 Encounter Discharge Information  Items Discharge Pain Level: 0 Discharge Condition: Stable Ambulatory Status: Walker Discharge Destination: Home Transportation: Private Auto Accompanied By: spouse Schedule Follow-up Appointment: Yes Medication Reconciliation completed No and provided to Patient/Care Virtie Bungert: Provided on Clinical Summary of Care: 10/03/2015 Form Type Recipient Paper Patient AS Electronic Signature(s) Signed: 10/03/2015 5:11:11 PM By: Montey Hora Previous Signature: 10/03/2015 4:40:09 PM Version By: Ruthine Dose Entered By: Montey Hora on 10/03/2015 17:11:11 [name], [name] ([id number]) -------------------------------------------------------------------------------- Multi Wound Chart Details Patient Name: [name], [name] Date of Service: 10/03/2015 3:30 PM Medical Record Number: [id number] Patient Account Number: [id number] Date of Birth/Sex: [birth year] (69 y.o. 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] [11:Pressure Ulcer] [3:Pressure Ulcer] Comorbid History: [10:Cataracts, Asthma, Hypertension, Type II Diabetes, Neuropathy, Received Chemotherapy] [11:Cataracts, Asthma, Hypertension, Type II Diabetes, Neuropathy, Received Chemotherapy] [3:Cataracts, Asthma, Hypertension, Type II Diabetes,  Neuropathy, Received Chemotherapy] Date Acquired: [10:09/12/2015] [11:09/29/2015] [3:05/02/2015] Weeks of Treatment: [10:2]  [11:0] [3:22] Wound Status: [10:Open] [11:Open] [3:Open] Measurements L x W x D 0.9x0.9x0.1 [11:1.5x0.6x0.1] [3:2x2.7x0.2] (cm) Area (cm) : [10:0.636] [11:0.707] [3:4.241] Volume (cm) : [10:0.064] [11:0.071] [3:0.848] % Reduction in Area: [10:-169.50%] [11:N/A] [3:-1827.70%] % Reduction in Volume: -166.70% [11:N/A] [3:-3754.50%] Classification: [10:Category/Stage II] [11:Category/Stage II] [3:Category/Stage II] HBO Classification: [10:Grade 1] [11:Grade 1] [3:N/A] Exudate Amount: [10:Medium] [11:Medium] [3:Medium] Exudate Type: [10:Serous] [11:Serous] [3:Serous] Exudate Color: [10:amber] [11:amber] [3:amber] Wound Margin: [10:Flat and Intact] [11:Flat and Intact] [3:Distinct, outline attached] Granulation Amount: [10:None Present (0%)] [11:Large (67-100%)] [3:Medium (34-66%)] Granulation Quality: [10:N/A] [11:Pink] [3:Red, Pink] Necrotic Amount: [10:Large (  67-100%)] [11:None Present (0%)] [3:Medium (34-66%)] Necrotic Tissue: [10:Eschar, Adherent Slough] [11:N/A] [3:Eschar, Adherent Slough] Exposed Structures: [10:Fascia: No Fat: No Tendon: No] [11:Fascia: No Fat: No Tendon: No] [3:Fascia: No Fat: No Tendon: No] Muscle: No Muscle: No Muscle: No Joint: No Joint: No Joint: No Bone: No Bone: No Bone: No Limited to Skin Limited to Skin Limited to Skin Breakdown Breakdown Breakdown Epithelialization: None None None Periwound Skin Texture: No Abnormalities Noted Edema: No Edema: Yes Excoriation: No Excoriation: No Induration: No Induration: No Callus: No Callus: No Crepitus: No Crepitus: No Fluctuance: No Fluctuance: No Friable: No Friable: No Rash: No Rash: No Scarring: No Scarring: No Periwound Skin No Abnormalities Noted Maceration: Yes Moist: Yes Moisture: Moist: Yes Maceration: No Dry/Scaly: No Dry/Scaly: No Periwound Skin Color: No Abnormalities Noted Atrophie [name]: No Atrophie [name]: No Cyanosis: No Cyanosis: No Ecchymosis: No Ecchymosis:  No Erythema: No Erythema: No Hemosiderin Staining: No Hemosiderin Staining: No Mottled: No Mottled: No Pallor: No Pallor: No Rubor: No Rubor: No Temperature: N/A N/A No Abnormality Tenderness on No No Yes Palpation: Wound Preparation: Ulcer Cleansing: Ulcer Cleansing: Ulcer Cleansing: Rinsed/Irrigated with Rinsed/Irrigated with Rinsed/Irrigated with Saline Saline Saline Topical Anesthetic Topical Anesthetic Topical Anesthetic Applied: None Applied: Other: lidocaine Applied: Other: lidocaine 4% 4% Wound Number: [id number]  Photos: No Photos No Photos No Photos Wound Location: Right Ischial Tuberosity Left Malleolus Left Ischial Tuberosity Wounding Event: Pressure Injury Gradually Appeared Gradually Appeared Primary Etiology: Pressure Ulcer Pressure Ulcer Pressure Ulcer Comorbid History: Cataracts, Asthma, Cataracts, Asthma, Cataracts, Asthma, Hypertension, Type II Hypertension, Type II Hypertension, Type II Diabetes, Neuropathy, Diabetes, Neuropathy, Diabetes, Neuropathy, Received Chemotherapy Received Chemotherapy Received Chemotherapy Date Acquired: 08/01/2015 09/12/2015 09/12/2015 Weeks of Treatment: 8 2 2  Wound Status: Open Open Open 1.4x0.3x0.1 1x1x0.1 1x2x0.1 Burandt, Shera H. ([id number]) Measurements L x W x D (cm) Area (cm) : 0.33 0.785 1.571 Volume (cm) : 0.033 0.079 0.157 % Reduction in Area: 89.50% -1570.20% -701.50% % Reduction in Volume: 89.50% -777.80% -685.00% Classification: Category/Stage III Category/Stage II Category/Stage II HBO Classification: N/A Grade 0 N/A Exudate Amount: Medium Medium Medium Exudate Type: Serosanguineous Serous Serous Exudate Color: red, brown amber amber Wound Margin: Distinct, outline attached Flat and Intact Flat and Intact Granulation Amount: None Present (0%) None Present (0%) None Present (0%) Granulation Quality: N/A N/A N/A Necrotic Amount: Large (67-100%) Large (67-100%) Large (67-100%) Necrotic Tissue: Adherent Slough  Eschar Eschar Exposed Structures: Fascia: No Fascia: No Fascia: No Fat: No Fat: No Fat: No Tendon: No Tendon: No Tendon: No Muscle: No Muscle: No Muscle: No Joint: No Joint: No Joint: No Bone: No Bone: No Bone: No Limited to Skin Limited to Skin Limited to Skin Breakdown Breakdown Breakdown Epithelialization: None None None Periwound Skin Texture: Edema: Yes No Abnormalities Noted No Abnormalities Noted Excoriation: No Induration: No Callus: No Crepitus: No Fluctuance: No Friable: No Rash: No Scarring: No Periwound Skin Moist: Yes Moist: Yes Moist: Yes Moisture: Maceration: No Dry/Scaly: No Periwound Skin Color: Atrophie [name]: No No Abnormalities Noted No Abnormalities Noted Cyanosis: No Ecchymosis: No Erythema: No Hemosiderin Staining: No Mottled: No Pallor: No Rubor: No Temperature: No Abnormality No Abnormality N/A Tenderness on No Yes No Palpation: Wound Preparation: Ulcer Cleansing: Ulcer Cleansing: Ulcer Cleansing: Rinsed/Irrigated with Rinsed/Irrigated with Rinsed/Irrigated with Noy, Shivali H. ([id number]) Saline Saline Saline Topical Anesthetic Topical Anesthetic Topical Anesthetic Applied: Other: lidocaine Applied: None Applied: Other: lidocaine 4% 4% Treatment Notes Electronic Signature(s) Signed: 10/03/2015 5:07:01 PM By: Montey Hora Entered By: Montey Hora on 10/03/2015 17:07:01 Bathe, Elika  Lemmie Evens ([id number]) -------------------------------------------------------------------------------- [place] Details Patient Name: BIESCHKE, Dilia H. Date of Service: 10/03/2015 3:30 PM Medical Record Number: [id number] Patient Account Number: [id number] Date of Birth/Sex: 03/02/1946 (69 y.o. Female) Treating RN: Montey Hora Primary Care Physician: Paulita Cradle Other Clinician: Referring Physician: Paulita Cradle Treating Physician/Extender: Frann Rider in Treatment: 13 Active Inactive Abuse / Safety /  Falls / Self Care Management Nursing Diagnoses: Impaired home maintenance Impaired physical mobility Knowledge deficit related to: safety; personal, health (wound), emergency Potential for falls Self care deficit: actual or potential Goals: Patient will remain injury free Date Initiated: 03/13/2015 Goal Status: Active Patient/caregiver will verbalize understanding of skin care regimen Date Initiated: 03/13/2015 Goal Status: Active Patient/caregiver will verbalize/demonstrate measure taken to improve self care Date Initiated: 03/13/2015 Goal Status: Active Patient/caregiver will verbalize/demonstrate measures taken to improve the patient's personal safety Date Initiated: 03/13/2015 Goal Status: Active Patient/caregiver will verbalize/demonstrate measures taken to prevent injury and/or falls Date Initiated: 03/13/2015 Goal Status: Active Patient/caregiver will verbalize/demonstrate understanding of what to do in case of emergency Date Initiated: 03/13/2015 Goal Status: Active Interventions: Assess fall risk on admission and as needed Assess: immobility, friction, shearing, incontinence upon admission and as needed Assess impairment of mobility on admission and as needed per policy Assess self care needs on admission and as needed Provide education on basic hygiene [place], [place]. ([id number]) Provide education on fall prevention Provide education on personal and home safety Provide education on safe transfers Treatment Activities: Education provided on Basic Hygiene : 03/13/2015 Notes: Orientation to the Wound Care Program Nursing Diagnoses: Knowledge deficit related to the wound healing center program Goals: Patient/caregiver will verbalize understanding of the [place] Date Initiated: 03/13/2015 Goal Status: Active Interventions: Provide education on orientation to the wound center Notes: Pressure Nursing Diagnoses: Knowledge deficit related to causes and  risk factors for pressure ulcer development Knowledge deficit related to management of pressures ulcers Potential for impaired tissue integrity related to pressure, friction, moisture, and shear Goals: Patient will remain free from development of additional pressure ulcers Date Initiated: 03/13/2015 Goal Status: Active Patient will remain free of pressure ulcers Date Initiated: 03/13/2015 Goal Status: Active Patient/caregiver will verbalize risk factors for pressure ulcer development Date Initiated: 03/13/2015 Goal Status: Active Patient/caregiver will verbalize understanding of pressure ulcer management Date Initiated: 03/13/2015 Goal Status: Active Interventions: Assess: immobility, friction, shearing, incontinence upon admission and as needed Kimbrough, Berry H. ([id number]) Assess offloading mechanisms upon admission and as needed Assess potential for pressure ulcer upon admission and as needed Provide education on pressure ulcers Treatment Activities: Patient referred for home evaluation of offloading devices/mattresses : 10/03/2015 Patient referred for pressure reduction/relief devices : 10/03/2015 Patient referred for seating evaluation to ensure proper offloading : 10/03/2015 Pressure reduction/relief device ordered : 10/03/2015 Test ordered outside of clinic : 10/03/2015 Notes: Wound/Skin Impairment Nursing Diagnoses: Impaired tissue integrity Knowledge deficit related to ulceration/compromised skin integrity Goals: Patient/caregiver will verbalize understanding of skin care regimen Date Initiated: 03/13/2015 Goal Status: Active Ulcer/skin breakdown will have a volume reduction of 30% by week 4 Date Initiated: 03/13/2015 Goal Status: Active Ulcer/skin breakdown will have a volume reduction of 50% by week 8 Date Initiated: 03/13/2015 Goal Status: Active Ulcer/skin breakdown will have a volume reduction of 80% by week 12 Date Initiated: 03/13/2015 Goal Status: Active Ulcer/skin  breakdown will heal within 14 weeks Date Initiated: 03/13/2015 Goal Status: Active Interventions: Assess patient/caregiver ability to obtain necessary supplies Assess patient/caregiver ability to perform ulcer/skin care regimen upon admission and as  needed Assess ulceration(s) every visit Provide education on smoking Provide education on ulcer and skin care Treatment Activities: Patient referred to home care : 10/03/2015 [name], [name] ([id number]) Referred to DME [facility] for dressing supplies : 10/03/2015 Skin care regimen initiated : 10/03/2015 Topical wound management initiated : 10/03/2015 Notes: Electronic Signature(s) Signed: 10/03/2015 6:12:53 PM By: Montey Hora Entered By: Montey Hora on 10/03/2015 16:16:55 [name], [name] ([id number]) -------------------------------------------------------------------------------- Patient/Caregiver Education Details Patient Name: [name], Kanani H. Date of Service: 10/03/2015 3:30 PM Medical Record Number: [id number] Patient Account Number: [id number] Date of Birth/Gender: [birth year] (70 y.o. Female) Treating RN: Montey Hora Primary Care Physician: Paulita Cradle Other Clinician: Referring Physician: Paulita Cradle Treating Physician/Extender: Frann Rider in Treatment: 64 Education Assessment Education Provided To: Patient and Caregiver Education Topics Provided Wound/Skin Impairment: Handouts: Other: new wound care as ordered Methods: Demonstration, Explain/Verbal, Printed Responses: State content correctly Electronic Signature(s) Signed: 10/03/2015 5:11:30 PM By: Montey Hora Entered By: Montey Hora on 10/03/2015 17:11:30 [name], [name] ([id number]) -------------------------------------------------------------------------------- Wound Assessment Details Patient Name: Kydd, Verlie H. Date of Service: 10/03/2015 3:30 PM Medical Record Number: [id number] Patient Account Number: [id number] Date of Birth/Sex:  1946/07/31 (69 y.o. Female) Treating RN: Montey Hora Primary Care Physician: Paulita Cradle Other Clinician: Referring Physician: Paulita Cradle Treating Physician/Extender: Frann Rider in Treatment: 29 Wound Status Wound Number: 10 Primary Pressure Ulcer Etiology: Wound Location: Right Foot - Medial Wound Open Wounding Event: Gradually Appeared Status: Date Acquired: 09/12/2015 Comorbid Cataracts, Asthma, Hypertension, Type Weeks Of Treatment: 2 History: II Diabetes, Neuropathy, Received Clustered Wound: No Chemotherapy Photos Photo Uploaded By: Montey Hora on 10/03/2015 17:15:00 Wound Measurements Length: (cm) 0.9 Width: (cm) 0.9 Depth: (cm) 0.1 Area: (cm) 0.636 Volume: (cm) 0.064 % Reduction in Area: -169.5% % Reduction in Volume: -166.7% Epithelialization: None Tunneling: No Undermining: No Wound Description Classification: Category/Stage II Diabetic Severity Earleen Newport): Grade 1 Wound Margin: Flat and Intact Exudate Amount: Medium Exudate Type: Serous Exudate Color: amber Foul Odor After Cleansing: No Wound Bed Granulation Amount: None Present (0%) Exposed Structure Necrotic Amount: Large (67-100%) Fascia Exposed: No Necrotic Quality: Eschar, Adherent Slough Fat Layer Exposed: No Brandau, Rupinder H. ([id number]) Tendon Exposed: No Muscle Exposed: No Joint Exposed: No Bone Exposed: No Limited to Skin Breakdown Periwound Skin Texture Texture Color No Abnormalities Noted: No No Abnormalities Noted: No Moisture No Abnormalities Noted: No Wound Preparation Ulcer Cleansing: Rinsed/Irrigated with Saline Topical Anesthetic Applied: None Treatment Notes Wound #10 (Right, Medial Foot) 1. Cleansed with: Clean wound with Normal Saline 4. Dressing Applied: Other dressing (specify in notes) 5. Secondary Dressing Applied Bordered Foam Dressing Notes betadine paint Electronic Signature(s) Signed: 10/03/2015 5:03:27 PM By: Montey Hora Entered By: Montey Hora on 10/03/2015 17:03:27 Foote, Emillia H. ([id number]) -------------------------------------------------------------------------------- Wound Assessment Details Patient Name: Sutherlin, Cyndee H. Date of Service: 10/03/2015 3:30 PM Medical Record Number: [id number] Patient Account Number: [id number] Date of Birth/Sex: 1946/01/30 (69 y.o. Female) Treating RN: Montey Hora Primary Care Physician: Paulita Cradle Other Clinician: Referring Physician: Paulita Cradle Treating Physician/Extender: Frann Rider in Treatment: 29 Wound Status Wound Number: 11 Primary Pressure Ulcer Etiology: Wound Location: Right Calcaneus Wound Open Wounding Event: Pressure Injury Status: Date Acquired: 09/29/2015 Comorbid Cataracts, Asthma, Hypertension, Type Weeks Of Treatment: 0 History: II Diabetes, Neuropathy, Received Clustered Wound: No Chemotherapy Photos Photo Uploaded By: Montey Hora on 10/03/2015 17:15:00 Wound Measurements Length: (cm) 1.5 Width: (cm) 0.6 Depth: (cm) 0.1 Area: (cm) 0.707 Volume: (cm) 0.071 % Reduction in Area: % Reduction in Volume: Epithelialization: None Tunneling: No  Undermining: No Wound Description Classification: Category/Stage II Foul Odor A Diabetic Severity ([place]): Grade 1 Wound Margin: Flat and Intact Exudate Amount: Medium Exudate Type: Serous Exudate Color: amber fter Cleansing: No Wound Bed Granulation Amount: Large (67-100%) Exposed Structure Granulation Quality: Pink Fascia Exposed: No Necrotic Amount: None Present (0%) Fat Layer Exposed: No Fultz, Laia H. ([id number]) Tendon Exposed: No Muscle Exposed: No Joint Exposed: No Bone Exposed: No Limited to Skin Breakdown Periwound Skin Texture Texture Color No Abnormalities Noted: No No Abnormalities Noted: No Callus: No Atrophie [name]: No Crepitus: No Cyanosis: No Excoriation: No Ecchymosis: No Fluctuance: No Erythema: No Friable:  No Hemosiderin Staining: No Induration: No Mottled: No Localized Edema: No Pallor: No Rash: No Rubor: No Scarring: No Moisture No Abnormalities Noted: No Dry / Scaly: No Maceration: Yes Moist: Yes Wound Preparation Ulcer Cleansing: Rinsed/Irrigated with Saline Topical Anesthetic Applied: Other: lidocaine 4%, Treatment Notes Wound #11 (Right Calcaneus) 1. Cleansed with: Clean wound with Normal Saline 2. Anesthetic Topical Lidocaine 4% cream to wound bed prior to debridement 4. Dressing Applied: Aquacel Ag 5. Secondary Dressing Applied Bordered Foam Dressing Electronic Signature(s) Signed: 10/03/2015 6:12:53 PM By: Montey Hora Entered By: Montey Hora on 10/03/2015 15:50:28 Coles, Eunique H. ([id number]) -------------------------------------------------------------------------------- Wound Assessment Details Patient Name: Hodder, Sani H. Date of Service: 10/03/2015 3:30 PM Medical Record Number: [id number] Patient Account Number: [id number] Date of Birth/Sex: 1945-12-28 (69 y.o. Female) Treating RN: Montey Hora Primary Care Physician: Paulita Cradle Other Clinician: Referring Physician: Paulita Cradle Treating Physician/Extender: Frann Rider in Treatment: 29 Wound Status Wound Number: 3 Primary Pressure Ulcer Etiology: Wound Location: Back - Midline Wound Open Wounding Event: Pressure Injury Status: Date Acquired: 05/02/2015 Comorbid Cataracts, Asthma, Hypertension, Type Weeks Of Treatment: 22 History: II Diabetes, Neuropathy, Received Clustered Wound: No Chemotherapy Photos Photo Uploaded By: Montey Hora on 10/03/2015 17:15:31 Wound Measurements Length: (cm) 2 Width: (cm) 2.7 Depth: (cm) 0.2 Area: (cm) 4.241 Volume: (cm) 0.848 % Reduction in Area: -1827.7% % Reduction in Volume: -3754.5% Epithelialization: None Tunneling: No Undermining: No Wound Description Classification: Category/Stage II Wound Margin: Distinct, outline  attached Exudate Amount: Medium Exudate Type: Serous Exudate Color: amber Foul Odor After Cleansing: No Wound Bed Granulation Amount: Medium (34-66%) Exposed Structure Granulation Quality: Red, Pink Fascia Exposed: No Necrotic Amount: Medium (34-66%) Fat Layer Exposed: No Necrotic Quality: Eschar, Adherent Slough Tendon Exposed: No Mckeone, Ferris H. ([id number]) Muscle Exposed: No Joint Exposed: No Bone Exposed: No Limited to Skin Breakdown Periwound Skin Texture Texture Color No Abnormalities Noted: No No Abnormalities Noted: No Callus: No Atrophie [name]: No Crepitus: No Cyanosis: No Excoriation: No Ecchymosis: No Fluctuance: No Erythema: No Friable: No Hemosiderin Staining: No Induration: No Mottled: No Localized Edema: Yes Pallor: No Rash: No Rubor: No Scarring: No Temperature / Pain Moisture Temperature: No Abnormality No Abnormalities Noted: No Tenderness on Palpation: Yes Dry / Scaly: No Maceration: No Moist: Yes Wound Preparation Ulcer Cleansing: Rinsed/Irrigated with Saline Topical Anesthetic Applied: Other: lidocaine 4%, Treatment Notes Wound #3 (Midline Back) 1. Cleansed with: Clean wound with Normal Saline 4. Dressing Applied: Santyl Ointment 5. Secondary Dressing Applied Bordered Foam Dressing Electronic Signature(s) Signed: 10/03/2015 5:04:03 PM By: Montey Hora Entered By: Montey Hora on 10/03/2015 17:04:03 Arviso, Yara H. ([id number]) -------------------------------------------------------------------------------- Wound Assessment Details Patient Name: Stadler, Jayona H. Date of Service: 10/03/2015 3:30 PM Medical Record Number: [id number] Patient Account Number: [id number] Date of Birth/Sex: 1945-11-20 (69 y.o. 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 Wound Open Wounding Event: Pressure Injury Status: Date Acquired: 08/01/2015 Comorbid Cataracts, Asthma, Hypertension, Type Weeks Of Treatment: 8 History: II Diabetes, Neuropathy, Received Clustered Wound: No Chemotherapy Photos Photo Uploaded By: Montey Hora on 10/03/2015 17:15:32 Wound Measurements Length: (cm) 1.4 Width: (cm) 0.3 Depth: (cm) 0.1 Area: (cm) 0.33 Volume: (cm) 0.033 % Reduction in Area: 89.5% % Reduction in Volume: 89.5% Epithelialization: None Tunneling: No Undermining: No Wound Description Classification: Category/Stage III Wound Margin: Distinct, outline attached Exudate Amount: Medium Exudate Type: Serosanguineous Exudate Color: red, brown Foul Odor After Cleansing: No Wound Bed Granulation Amount: None Present (0%) Exposed Structure Necrotic Amount: Large (67-100%) Fascia Exposed: No Necrotic Quality: Adherent Slough Fat Layer Exposed: No Tendon Exposed: No Sias, Elara H. ([id number]) Muscle Exposed: No Joint Exposed: No Bone Exposed: No Limited to Skin Breakdown Periwound Skin Texture Texture Color No Abnormalities Noted: No No Abnormalities Noted: No Callus: No Atrophie [name]: No Crepitus: No Cyanosis: No Excoriation: No Ecchymosis: No Fluctuance: No Erythema: No Friable: No Hemosiderin Staining: No Induration: No Mottled: No Localized Edema: Yes Pallor: No Rash: No Rubor: No Scarring: No Temperature / Pain Moisture Temperature: No Abnormality No Abnormalities Noted: No Dry / Scaly: No Maceration: No Moist: Yes Wound Preparation Ulcer Cleansing: Rinsed/Irrigated with Saline Topical Anesthetic Applied: Other: lidocaine 4%, Treatment Notes Wound #7 (Right Ischial Tuberosity) 1. Cleansed with: Clean wound with Normal Saline 4. Dressing Applied: Santyl Ointment 5. Secondary Dressing Applied Bordered Foam Dressing Electronic  Signature(s) Signed: 10/03/2015 5:04:27 PM By: Montey Hora Entered By: Montey Hora on 10/03/2015 17:04:27 Mukai, Story H. ([id number]) -------------------------------------------------------------------------------- Wound Assessment Details Patient Name: Sinkler, Madeline H. Date of Service: 10/03/2015 3:30 PM Medical Record Number: [id number] Patient Account Number: [id number] Date of Birth/Sex: Oct 28, 1945 (69 y.o. Female) Treating RN: Montey Hora Primary Care Physician: Paulita Cradle Other Clinician: Referring Physician: Paulita Cradle Treating Physician/Extender: Frann Rider in Treatment: 29 Wound Status Wound Number: 8 Primary Pressure Ulcer Etiology: Wound Location: Left Malleolus Wound Open Wounding Event: Gradually Appeared Status: Date Acquired: 09/12/2015 Comorbid Cataracts, Asthma, Hypertension, Type Weeks Of Treatment: 2 History: II Diabetes, Neuropathy, Received Clustered Wound: No Chemotherapy Photos Photo Uploaded By: Montey Hora on 10/03/2015 17:16:03 Wound Measurements Length: (cm) 1 Width: (cm) 1 Depth: (cm) 0.1 Area: (cm) 0.785 Volume: (cm) 0.079 % Reduction in Area: -1570.2% % Reduction in Volume: -777.8% Epithelialization: None Wound Description Classification: Category/Stage II Foul Odor Diabetic Severity (Wagner): Grade 0 Wound Margin: Flat and Intact Exudate Amount: Medium Exudate Type: Serous Exudate Color: amber After Cleansing: No Wound Bed Granulation Amount: None Present (0%) Exposed Structure Necrotic Amount: Large (67-100%) Fascia Exposed: No Necrotic Quality: Eschar Fat Layer Exposed: No Cocker, Winnie H. ([id number]) Tendon Exposed: No Muscle Exposed: No Joint Exposed: No Bone Exposed: No Limited to Skin Breakdown Periwound Skin Texture Texture Color No Abnormalities Noted: No No Abnormalities Noted: No Moisture Temperature / Pain No Abnormalities Noted: No Temperature: No Abnormality Moist:  Yes Tenderness on Palpation: Yes Wound Preparation Ulcer Cleansing: Rinsed/Irrigated with Saline Topical Anesthetic Applied: None Treatment Notes Wound #8 (Left Malleolus) 1. Cleansed with: Clean wound with Normal Saline 4. Dressing Applied: Other dressing (specify in notes) 5. Secondary Dressing Applied Bordered Foam Dressing Notes betadine paint Electronic Signature(s) Signed: 10/03/2015 5:06:26 PM By: Montey Hora Entered By: Montey Hora on 10/03/2015 17:06:26 Murillo, Julicia H. ([id number]) -------------------------------------------------------------------------------- Wound Assessment Details Patient Name: Darden, Amarrah H. Date of Service: 10/03/2015  3:30 PM Medical Record Number: [id number] Patient Account Number: [id number] Date of Birth/Sex: 04/18/1946 (69 y.o. 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 Wound Open Wounding Event: Gradually Appeared Status: Date Acquired: 09/12/2015 Comorbid Cataracts, Asthma, Hypertension, Type Weeks Of Treatment: 2 History: II Diabetes, Neuropathy, Received Clustered Wound: No Chemotherapy Photos Photo Uploaded By: Montey Hora on 10/03/2015 17:16:03 Wound Measurements Length: (cm) 1 Width: (cm) 2 Depth: (cm) 0.1 Area: (cm) 1.571 Volume: (cm) 0.157 % Reduction in Area: -701.5% % Reduction in Volume: -685% Epithelialization: None Tunneling: No Undermining: No Wound Description Classification: Category/Stage II Wound Margin: Flat and Intact Exudate Amount: Medium Exudate Type: Serous Exudate Color: amber Foul Odor After Cleansing: No Wound Bed Granulation Amount: None Present (0%) Exposed Structure Necrotic Amount: Large (67-100%) Fascia Exposed:  No Necrotic Quality: Eschar Fat Layer Exposed: No Tendon Exposed: No Gulbranson, Anoushka H. ([id number]) Muscle Exposed: No Joint Exposed: No Bone Exposed: No Limited to Skin Breakdown Periwound Skin Texture Texture Color No Abnormalities Noted: No No Abnormalities Noted: No Moisture No Abnormalities Noted: No Moist: Yes Wound Preparation Ulcer Cleansing: Rinsed/Irrigated with Saline Topical Anesthetic Applied: Other: lidocaine 4%, Treatment Notes Wound #9 (Left Ischial Tuberosity) 1. Cleansed with: Clean wound with Normal Saline 4. Dressing Applied: Other dressing (specify in notes) 5. Secondary Dressing Applied Bordered Foam Dressing Notes betadine paint Electronic Signature(s) Signed: 10/03/2015 5:06:49 PM By: Montey Hora Entered By: Montey Hora on 10/03/2015 17:06:49 Capasso, Denelda H. ([id number]) -------------------------------------------------------------------------------- Vitals Details Patient Name: [name], Chealsea H. Date of Service: 10/03/2015 3:30 PM Medical Record Number: [id number] Patient Account Number: [id number] Date of Birth/Sex: [birth year] (69 y.o. Female) Treating RN: Montey Hora Primary Care Physician: Paulita Cradle Other Clinician: Referring Physician: Paulita Cradle Treating Physician/Extender: Frann Rider in Treatment: 29 Vital Signs Time Taken: 15:39 Pulse (bpm): 130 Height (in): 65 Respiratory Rate (breaths/min): 18 Weight (lbs): 100 Blood Pressure (mmHg): 113/82 Body Mass Index (BMI): 16.6 Reference Range: 80 - 120 mg / dl Electronic Signature(s) Signed: 10/03/2015 6:12:53 PM By: Montey Hora Entered By: Montey Hora on 10/03/2015 15:41:06

## 2015-10-04 NOTE — Progress Notes (Addendum)
[name], [name] ([id number]) Visit Report for 10/03/2015 Chief Complaint Document Details Patient Name: [name], [name] Date of Service: 10/03/2015 3:30 PM Medical Record Number: [id number] Patient Account Number: [id number] Date of Birth/Sex: [birth year] (69 y.o. Female) Treating RN: Montey Hora Primary Care Physician: Paulita Cradle Other Clinician: Referring Physician: Paulita Cradle Treating Physician/Extender: Frann Rider in Treatment: 29 Information Obtained from: Patient Chief Complaint Patient presents to the wound care center for a consult due non healing wound. Ulcers on the right elbow and the right heel for about 1 month. Electronic Signature(s) Signed: 10/03/2015 3:46:06 PM By: Christin Fudge MD, FACS Entered By: Christin Fudge on 10/03/2015 15:46:06 Yellen, [name] ([id number]) -------------------------------------------------------------------------------- HPI Details Patient Name: [name], [name] Date of Service: 10/03/2015 3:30 PM Medical Record Number: [id number] Patient Account Number: [id number] Date of Birth/Sex: [birth year] (69 y.o. Female) Treating RN: Carolyne Fiscal, Debi Primary Care Physician: Paulita Cradle Other Clinician: Referring Physician: Paulita Cradle Treating Physician/Extender: Frann Rider in Treatment: 29 History of Present Illness Location: Ulceration on the right heel and the right elbow. Quality: Patient reports experiencing a dull pain to affected area(s). Severity: Patient states wound (s) are getting better. Duration: Patient has had the wound for < 4 weeks prior to presenting for treatment Timing: Pain in wound is Intermittent (comes and goes Context: The wound appeared gradually over time Modifying Factors: Consults to this date include:Augmentin and Bactrim and also some heel protection with duoderm Associated Signs and Symptoms: Patient reports having difficulty standing for long periods. HPI Description:  70 year old female with history of peripheral neuropathy, history of diet controlled diabetes mellitus type 2, history of alcoholism here for wound consult sent by her PCP Dr. Sherilyn Cooter. She has pressure ulcers at her right elbow, bilateral heels. Plain films of right calcaneus without acute bony process. Patient started by PCP on Augmentin, Bactrim as per orders, DuoDerm dressings applied - reports some improvement in her ulcer since last seen. Denies fever, chills, nausea, vomiting, diarrhea. She had a right humerus fracture in the middle of May and has had no surgery and arm is in a sling. She is also been laying in the bed for quite a while. Past medical history significant for essential hypertension, osteoporosis, peripheral neuropathy, alcoholism, ataxia, personal history of breast cancer treated with surgery chemotherapy and radiation and this was done in December 2010. she is also status post laparoscopic cholecystectomy, pilonidal cyst excision, subcutaneous port placement, partial mastectomy on the left side, skin cancer removal. 03/21/2015 -- she says overall she's been doing better and continues to smoke about 15 cigarettes a day. 03/21/2015 - her orthopedic doctor has said she may require surgery for her right humerus fracture. 04/04/2015 -- her orthopedic surgery has been scheduled for August 11. 04/18/2015 -- she is doing fine as far as her elbow and her right heel goes but she has developed some redness over prominence on her thoracic spine and wanted me to take a look at this. 05/02/2015 -- she had her surgery done and now is in a sling and support. Her back has developed a pressure injury of undetermined stage. She seems to be in better spirits. 05/16/2015 -- last week her right heel was looking great and we had healed it out, but she has not been offloading appropriately and has a deep tissue injury on the right heel again. The area on her right elbow has opened out with slough and  the area in the thoracic spine is also getting worse. 05/30/2015 -- she has developed  2 new ulcerations one on her left ischial tuberosity and one on the sacral region. She is still working on getting up smoking but is also unable to take her vitamins as she says she develops a diarrhea when she takes vitamins. She has increased her intake of proteins. 06/06/2015 -- the patient's husband manages to get her a low air loss mattress with initiating pressure but [name], [name] ([id number]) did not know how to use it exactly and the patient was not happy about using it. Overall she says she's been feeling better. 07/11/2015 -- . Discussed a surgical opinion for debridement and application of a wound VAC, 2 weeks ago but the patient has been reluctant to get a surgical opinion as she wants to avoid surgery. 07/18/2015 -- they have an appointment to see Dr. Tamala Julian this coming Wednesday. 07/29/2015 -- they saw Dr. Tamala Julian in his office and he did a debridement of the wound and removed significant amount of slough. This is in addition to the debridement I had done previously on Friday where a large amount of the eschar was removed. He did not recommend the application of wound VAC. Addendum : Dr. Thompson Caul note was reviewed via EPIC and details noted as above. 08/05/2015 -- over the last week she has developed a another pressure injury to her right hip and has had significant discoloration of the skin and an eschar there. 08/15/2015 -- she is still smoking about a pack of cigarettes a day and does not seem to want to quit. They have not been able to talk to the vendor regarding the air mattress and I will ask them to get in touch again. She is reluctant to take vitamins and does admit her nutrition is poor. 08/22/2015 -- she has been unable to tolerate her vitamins and continues to smoke. They're working on getting a low air loss mattress and have been speaking with the vendor. 09/19/2015 -- since her last  visit and was admitted to the hospital between 09/09/2015 and 09/16/2015. She was thought to have an active sepsis possibly from one of her decubitus ulcers but nothing was grown except for an MSSA from her thoracic spine region. CT of this area did not show any osteomyelitis. Been given IV antibiotics which included vancomycin and Zosyn in the hospital under the care of Dr. [name] the ID specialist she was sent home on oral Keflex 500 mg 3 times a day for 2 weeks. he will consider an MRI in the outpatient setting to completely rule out osteomyelitis of the spine. 10/03/2015 --readmitted to hospital on 09/23/2015 for general debility, lethargy and possible sepsis and workup was in progress. Seen by Dr. [name] and he would also like a workup for underlying malignancy as sheos had previous ultrasounds of the breasts suggesting findings of concern. Workup done so far does not suggest deep bony infection. She was treated for a pneumonia and received Zosyn and vancomycin. She had been recommended Cipro 500 twice a day and doxycycline 100 mg twice a day once she was to be [place] home. The antibiotics were to be stopped on 10/07/2015. Electronic Signature(s) Signed: 10/03/2015 3:47:31 PM By: Christin Fudge MD, FACS Previous Signature: 10/03/2015 2:49:45 PM Version By: Christin Fudge MD, FACS Previous Signature: 10/03/2015 2:45:12 PM Version By: Christin Fudge MD, FACS Entered By: Christin Fudge on 10/03/2015 15:47:31 [name], [name] ([id number]) -------------------------------------------------------------------------------- Physical Exam Details Patient Name: Lovering, Veronique H. Date of Service: 10/03/2015 3:30 PM Medical Record Number: [id number] Patient Account Number: [id number] Date  of Birth/Sex: Sep 24, 1945 (69 y.o. Female) Treating RN: Montey Hora Primary Care Physician: Paulita Cradle Other Clinician: Referring Physician: Paulita Cradle Treating Physician/Extender: Frann Rider  in Treatment: 29 Constitutional . Pulse regular. Respirations normal and unlabored. Afebrile. . Eyes Nonicteric. Reactive to light. Ears, Nose, Mouth, and Throat Lips, teeth, and gums WNL.Marland Kitchen Moist mucosa without lesions. Neck supple and nontender. No palpable supraclavicular or cervical adenopathy. Normal sized without goiter. Respiratory WNL. No retractions.. Breath sounds WNL, No rubs, rales, rhonchi, or wheeze.. Cardiovascular Heart rhythm and rate regular, no murmur or gallop.. Pedal Pulses WNL. No clubbing, cyanosis or edema. Chest Breasts symmetical and no nipple discharge.. Breast tissue WNL, no masses, lumps, or tenderness.. Gastrointestinal (GI) Abdomen without masses or tenderness.. No liver or spleen enlargement or tenderness.. Genitourinary (GU) No hydrocele, spermatocele, tenderness of the cord, or testicular mass.Marland Kitchen Penis without lesions.Lowella Fairy without lesions. No cystocele, or rectocele. Pelvic support intact, no discharge.Marland Kitchen Urethra without masses, tenderness or scarring.Marland Kitchen Lymphatic No adneopathy. No adenopathy. No adenopathy. Musculoskeletal Adexa without tenderness or enlargement.. Digits and nails w/o clubbing, cyanosis, infection, petechiae, ischemia, or inflammatory conditions.. Integumentary (Hair, Skin) No suspicious lesions. No crepitus or fluctuance. No peri-wound warmth or erythema. No masses.Marland Kitchen Psychiatric Judgement and insight Intact.. No evidence of depression, anxiety, or agitation.. Notes thoracic spine looks much worse than it was last time I saw her and has increased in size and the significant amount of slough. The right hip is now definitely a stage III pressure ulcer and will need Santyl ointment locally. The rest of the pressure injuries was stage II on the right medial ankle left lateral ankle and the left and right hip. she is also a new stage II pressure injury to the right heel. [name], [name] ([id number]) Electronic Signature(s) Signed:  10/03/2015 4:34:20 PM By: Christin Fudge MD, FACS Previous Signature: 10/03/2015 3:48:01 PM Version By: Christin Fudge MD, FACS Entered By: Christin Fudge on 10/03/2015 16:34:20 [name], [name] ([id number]) -------------------------------------------------------------------------------- Physician Orders Details Patient Name: Andreason, Dorathea H. Date of Service: 10/03/2015 3:30 PM Medical Record Number: [id number] Patient Account Number: [id number] Date of Birth/Sex: 08-05-1946 (69 y.o. Female) Treating RN: Montey Hora Primary Care Physician: Paulita Cradle Other Clinician: Referring Physician: Paulita Cradle Treating Physician/Extender: Frann Rider in Treatment: 36 Verbal / Phone Orders: Yes Clinician: Montey Hora Read Back and Verified: Yes Diagnosis Coding ICD-10 Coding Code Description 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, stage 3 F17.218 Nicotine dependence, cigarettes, with other nicotine-induced disorders L89.100 Pressure ulcer of unspecified part of back, unstageable L89.213 Pressure ulcer of right hip, stage 3 Wound Cleansing Wound #10 Right,Medial Foot o Clean wound with Normal Saline. Wound #11 Right Calcaneus o Clean wound with Normal Saline. Wound #3 Midline Back o Clean wound with Normal Saline. Wound #7 Right Ischial Tuberosity o Clean wound with Normal Saline. Wound #8 Left Malleolus o Clean wound with Normal Saline. Wound #9 Left Ischial Tuberosity o Clean wound with Normal Saline. Anesthetic Wound #10 Right,Medial Foot o Topical Lidocaine 4% cream applied to wound bed prior to debridement Wound #11 Right Calcaneus o Topical Lidocaine 4% cream applied to wound bed prior to debridement Wound #3 Midline Back o Topical Lidocaine 4% cream applied to wound bed prior to debridement Boghosian, Nannie H. ([id number]) Wound #7 Right Ischial Tuberosity o Topical Lidocaine 4% cream applied to  wound bed prior to debridement Wound #8 Left Malleolus o Topical Lidocaine 4% cream applied to wound bed prior to debridement  Wound #9 Left Ischial Tuberosity o Topical Lidocaine 4% cream applied to wound bed prior to debridement Skin Barriers/Peri-Wound Care Wound #10 Right,Medial Foot o Skin Prep Wound #11 Right Calcaneus o Skin Prep Wound #3 Midline Back o Skin Prep Wound #7 Right Ischial Tuberosity o Skin Prep Wound #8 Left Malleolus o Skin Prep Wound #9 Left Ischial Tuberosity o Skin Prep Primary Wound Dressing Wound #3 Midline Back o Santyl Ointment Wound #7 Right Ischial Tuberosity o Santyl Ointment Wound #10 Right,Medial Foot o Other: - betadine paint Wound #8 Left Malleolus o Other: - betadine paint Wound #9 Left Ischial Tuberosity o Other: - betadine paint Wound #11 Right Calcaneus o Aquacel Ag [name], [name] ([id number]) Secondary Dressing Wound #10 Right,Medial Foot o Boardered Foam Dressing Wound #11 Right Calcaneus o Boardered Foam Dressing Wound #3 Midline Back o Boardered Foam Dressing Wound #7 Right Ischial Tuberosity o Boardered Foam Dressing Wound #8 Left Malleolus o Boardered Foam Dressing Wound #9 Left Ischial Tuberosity o Boardered Foam Dressing Dressing Change Frequency Wound #10 Right,Medial Foot o Change dressing every other day. Wound #11 Right Calcaneus o Change dressing every other day. Wound #3 Midline Back o Change dressing every other day. Wound #7 Right Ischial Tuberosity o Change dressing every other day. Wound #8 Left Malleolus o Change dressing every other day. Wound #9 Left Ischial Tuberosity o Change dressing every other day. Follow-up Appointments Wound #10 Right,Medial Foot o Return Appointment in 1 week. Wound #11 Right Calcaneus o Return Appointment in 1 week. Wound #3 Midline Back o Return Appointment in 1 week. Wound #7 Right Ischial  Tuberosity [name], [name] ([id number]) o Return Appointment in 1 week. Wound #8 Left Malleolus o Return Appointment in 1 week. Wound #9 Left Ischial Tuberosity o Return Appointment in 1 week. Home Health Wound #10 [place] Visits - [place] Nurse may visit PRN to address patientos wound care needs. o FACE TO FACE ENCOUNTER: MEDICARE and MEDICAID PATIENTS: I certify that this patient is under my care and that I had a face-to-face encounter that meets the physician face-to-face encounter requirements with this patient on this date. The encounter with the patient was in whole or in part for the following MEDICAL CONDITION: (primary reason for [place]) MEDICAL NECESSITY: I certify, that based on my findings, NURSING services are a medically necessary home health service. HOME BOUND STATUS: I certify that my clinical findings support that this patient is homebound (i.e., Due to illness or injury, pt requires aid of supportive devices such as crutches, cane, wheelchairs, walkers, the use of special transportation or the assistance of another person to leave their place of residence. There is a normal inability to leave the home and doing so requires considerable and taxing effort. Other absences are for medical reasons / religious services and are infrequent or of short duration when for other reasons). o If current dressing causes regression in wound condition, may D/C ordered dressing product/s and apply Normal Saline Moist Dressing daily until next [place] / Other MD appointment. [place] of regression in wound condition at [phone]. o Please direct any NON-WOUND related issues/requests for orders to patient's Primary Care Physician Wound #11 Right Calcaneus o [place] Visits - [place] Nurse may visit PRN to address patientos wound care needs. o FACE TO  FACE ENCOUNTER: MEDICARE and MEDICAID PATIENTS: I certify that this patient is under my care and that I had a  face-to-face encounter that meets the physician face-to-face encounter requirements with this patient on this date. The encounter with the patient was in whole or in part for the following MEDICAL CONDITION: (primary reason for [place]) MEDICAL NECESSITY: I certify, that based on my findings, NURSING services are a medically necessary home health service. HOME BOUND STATUS: I certify that my clinical findings support that this patient is homebound (i.e., Due to illness or injury, pt requires aid of supportive devices such as crutches, cane, wheelchairs, walkers, the use of special transportation or the assistance of another person to leave their place of residence. There is a normal inability to leave the home and doing so requires considerable and taxing effort. Other absences are for medical reasons / religious services and are infrequent or of short duration when for other reasons). o If current dressing causes regression in wound condition, may D/C ordered dressing product/s and apply Normal Saline Moist Dressing daily until next [place] / Other MD appointment. [place] of regression in wound condition at [phone]. o Please direct any NON-WOUND related issues/requests for orders to patient's Primary Care Physician [place], Neville HMarland Kitchen ([id number]) Wound #3 Midline Back o [place] Visits - [place] Nurse may visit PRN to address patientos wound care needs. o FACE TO FACE ENCOUNTER: MEDICARE and MEDICAID PATIENTS: I certify that this patient is under my care and that I had a face-to-face encounter that meets the physician face-to-face encounter requirements with this patient on this date. The encounter with the patient was in whole or in part for the following MEDICAL CONDITION: (primary reason for [place]) MEDICAL NECESSITY: I certify, that based on my findings, NURSING services are a medically necessary home health service. HOME BOUND STATUS: I certify that my clinical findings support that this patient is homebound (i.e., Due to illness or injury, pt requires aid of supportive devices such as crutches, cane, wheelchairs, walkers, the use of special transportation or the assistance of another person to leave their place of residence. There is a normal inability to leave the home and doing so requires considerable and taxing effort. Other absences are for medical reasons / religious services and are infrequent or of short duration when for other reasons). o If current dressing causes regression in wound condition, may D/C ordered dressing product/s and apply Normal Saline Moist Dressing daily until next [place] / Other MD appointment. [place] of regression in wound condition at [phone]. o Please direct any NON-WOUND related issues/requests for orders to patient's Primary Care Physician Wound #7 Right Ischial [place] Visits - [place] Nurse may visit PRN to address patientos wound care needs. o FACE TO FACE ENCOUNTER: MEDICARE and MEDICAID PATIENTS: I certify that this patient is under my care and that I had a face-to-face encounter that meets the physician face-to-face encounter requirements with this patient on this date. The encounter with the patient was in whole or in part for the following MEDICAL CONDITION: (primary reason for [place]) MEDICAL NECESSITY: I certify, that based on my findings, NURSING services are a medically necessary home health service. HOME BOUND STATUS: I certify that my clinical findings support that this patient is homebound (i.e., Due to illness or injury, pt requires aid of supportive devices such as crutches, cane, wheelchairs, walkers, the use of  special transportation or the assistance of another person to leave their place of residence. There is a  normal inability to leave the home and doing so requires considerable and taxing effort. Other absences are for medical reasons / religious services and are infrequent or of short duration when for other reasons). o If current dressing causes regression in wound condition, may D/C ordered dressing product/s and apply Normal Saline Moist Dressing daily until next [place] / Other MD appointment. [place] of regression in wound condition at [phone]. o Please direct any NON-WOUND related issues/requests for orders to patient's Primary Care Physician Wound #8 Left [place] Visits - [place] Nurse may visit PRN to address patientos wound care needs. o FACE TO FACE ENCOUNTER: MEDICARE and MEDICAID PATIENTS: I certify that this patient is under my care and that I had a face-to-face encounter that meets the physician face-to-face encounter requirements with this patient on this date. The encounter with the patient was in whole or in part for the following MEDICAL CONDITION: (primary reason for [place]) [name], [name] ([id number]) MEDICAL NECESSITY: I certify, that based on my findings, NURSING services are a medically necessary home health service. HOME BOUND STATUS: I certify that my clinical findings support that this patient is homebound (i.e., Due to illness or injury, pt requires aid of supportive devices such as crutches, cane, wheelchairs, walkers, the use of special transportation or the assistance of another person to leave their place of residence. There is a normal inability to leave the home and doing so requires considerable and taxing effort. Other absences are for medical reasons / religious services and are infrequent or of short duration when for other reasons). o If current dressing  causes regression in wound condition, may D/C ordered dressing product/s and apply Normal Saline Moist Dressing daily until next [place] / Other MD appointment. [place] of regression in wound condition at [phone]. o Please direct any NON-WOUND related issues/requests for orders to patient's Primary Care Physician Wound #9 Left Ischial [place] Visits - [place] Nurse may visit PRN to address patientos wound care needs. o FACE TO FACE ENCOUNTER: MEDICARE and MEDICAID PATIENTS: I certify that this patient is under my care and that I had a face-to-face encounter that meets the physician face-to-face encounter requirements with this patient on this date. The encounter with the patient was in whole or in part for the following MEDICAL CONDITION: (primary reason for [place]) MEDICAL NECESSITY: I certify, that based on my findings, NURSING services are a medically necessary home health service. HOME BOUND STATUS: I certify that my clinical findings support that this patient is homebound (i.e., Due to illness or injury, pt requires aid of supportive devices such as crutches, cane, wheelchairs, walkers, the use of special transportation or the assistance of another person to leave their place of residence. There is a normal inability to leave the home and doing so requires considerable and taxing effort. Other absences are for medical reasons / religious services and are infrequent or of short duration when for other reasons). o If current dressing causes regression in wound condition, may D/C ordered dressing product/s and apply Normal Saline Moist Dressing daily until next [place] / Other MD appointment. [place] of regression in wound condition at [phone]. o Please direct any NON-WOUND related issues/requests for orders to patient's Primary Care Physician Electronic  Signature(s) Signed: 10/03/2015 5:08:53 PM By: Montey Hora Signed: 10/06/2015 4:13:39 PM By: Christin Fudge MD, FACS  Entered By: Montey Hora on 10/03/2015 17:08:53 [place], Secret H. ([id number]) -------------------------------------------------------------------------------- Problem List Details Patient Name: Clopper, Trishia H. Date of Service: 10/03/2015 3:30 PM Medical Record Number: [id number] Patient Account Number: [id number] Date of Birth/Sex: November 30, 1945 (69 y.o. Female) Treating RN: Montey Hora Primary Care Physician: Paulita Cradle Other Clinician: Referring Physician: Paulita Cradle Treating Physician/Extender: Frann Rider in Treatment: 29 Active Problems ICD-10 Encounter Code Description Active Date Diagnosis E11.621 Type 2 diabetes mellitus with foot ulcer 03/13/2015 Yes L89.613 Pressure ulcer of right heel, stage 3 03/13/2015 Yes F17.218 Nicotine dependence, cigarettes, with other nicotine- 03/13/2015 Yes induced disorders L89.100 Pressure ulcer of unspecified part of back, unstageable 05/02/2015 Yes L89.213 Pressure ulcer of right hip, stage 3 08/05/2015 Yes Inactive Problems Resolved Problems ICD-10 Code Description Active Date Resolved Date L89.013 Pressure ulcer of right elbow, stage 3 03/13/2015 03/13/2015 [id number] Pressure ulcer of left hip, stage 2 05/30/2015 05/30/2015 L89.153 Pressure ulcer of sacral region, stage 3 05/30/2015 05/30/2015 Electronic Signature(s) [name], Alexzia Lemmie Evens ([id number]) Signed: 10/03/2015 3:45:59 PM By: Christin Fudge MD, FACS Entered By: Christin Fudge on 10/03/2015 15:45:59 Vespa, Marybella H. ([id number]) -------------------------------------------------------------------------------- Progress Note Details Patient Name: [name], [name] Date of Service: 10/03/2015 3:30 PM Medical Record Number: [id number] Patient Account Number: [id number] Date of Birth/Sex: [birth year] (69 y.o. Female) Treating RN: Montey Hora Primary Care Physician:  Paulita Cradle Other Clinician: Referring Physician: Paulita Cradle Treating Physician/Extender: Frann Rider in Treatment: 29 Subjective Chief Complaint Information obtained from Patient Patient presents to the wound care center for a consult due non healing wound. Ulcers on the right elbow and the right heel for about 1 month. History of Present Illness (HPI) The following HPI elements were documented for the patient's wound: Location: Ulceration on the right heel and the right elbow. Quality: Patient reports experiencing a dull pain to affected area(s). Severity: Patient states wound (s) are getting better. Duration: Patient has had the wound for < 4 weeks prior to presenting for treatment Timing: Pain in wound is Intermittent (comes and goes Context: The wound appeared gradually over time Modifying Factors: Consults to this date include:Augmentin and Bactrim and also some heel protection with duoderm Associated Signs and Symptoms: Patient reports having difficulty standing for long periods. 70 year old female with history of peripheral neuropathy, history of diet controlled diabetes mellitus type 2, history of alcoholism here for wound consult sent by her PCP Dr. Sherilyn Cooter. She has pressure ulcers at her right elbow, bilateral heels. Plain films of right calcaneus without acute bony process. Patient started by PCP on Augmentin, Bactrim as per orders, DuoDerm dressings applied - reports some improvement in her ulcer since last seen. Denies fever, chills, nausea, vomiting, diarrhea. She had a right humerus fracture in the middle of May and has had no surgery and arm is in a sling. She is also been laying in the bed for quite a while. Past medical history significant for essential hypertension, osteoporosis, peripheral neuropathy, alcoholism, ataxia, personal history of breast cancer treated with surgery chemotherapy and radiation and this was done in December 2010. she is  also status post laparoscopic cholecystectomy, pilonidal cyst excision, subcutaneous port placement, partial mastectomy on the left side, skin cancer removal. 03/21/2015 -- she says overall she's been doing better and continues to smoke about 15 cigarettes a day. 03/21/2015 - her orthopedic doctor has said she may require surgery for her right humerus fracture. 04/04/2015 -- her orthopedic surgery has been scheduled for August 11. 04/18/2015 -- she is doing fine as far as her  elbow and her right heel goes but she has developed some redness over prominence on her thoracic spine and wanted me to take a look at this. Holck, Kashae H. ([id number]) 05/02/2015 -- she had her surgery done and now is in a sling and support. Her back has developed a pressure injury of undetermined stage. She seems to be in better spirits. 05/16/2015 -- last week her right heel was looking great and we had healed it out, but she has not been offloading appropriately and has a deep tissue injury on the right heel again. The area on her right elbow has opened out with slough and the area in the thoracic spine is also getting worse. 05/30/2015 -- she has developed 2 new ulcerations one on her left ischial tuberosity and one on the sacral region. She is still working on getting up smoking but is also unable to take her vitamins as she says she develops a diarrhea when she takes vitamins. She has increased her intake of proteins. 06/06/2015 -- the patient's husband manages to get her a low air loss mattress with initiating pressure but did not know how to use it exactly and the patient was not happy about using it. Overall she says she's been feeling better. 07/11/2015 -- . Discussed a surgical opinion for debridement and application of a wound VAC, 2 weeks ago but the patient has been reluctant to get a surgical opinion as she wants to avoid surgery. 07/18/2015 -- they have an appointment to see Dr. Tamala Julian this coming  Wednesday. 07/29/2015 -- they saw Dr. Tamala Julian in his office and he did a debridement of the wound and removed significant amount of slough. This is in addition to the debridement I had done previously on Friday where a large amount of the eschar was removed. He did not recommend the application of wound VAC. Addendum : Dr. Thompson Caul note was reviewed via EPIC and details noted as above. 08/05/2015 -- over the last week she has developed a another pressure injury to her right hip and has had significant discoloration of the skin and an eschar there. 08/15/2015 -- she is still smoking about a pack of cigarettes a day and does not seem to want to quit. They have not been able to talk to the vendor regarding the air mattress and I will ask them to get in touch again. She is reluctant to take vitamins and does admit her nutrition is poor. 08/22/2015 -- she has been unable to tolerate her vitamins and continues to smoke. They're working on getting a low air loss mattress and have been speaking with the vendor. 09/19/2015 -- since her last visit and was admitted to the hospital between 09/09/2015 and 09/16/2015. She was thought to have an active sepsis possibly from one of her decubitus ulcers but nothing was grown except for an MSSA from her thoracic spine region. CT of this area did not show any osteomyelitis. Been given IV antibiotics which included vancomycin and Zosyn in the hospital under the care of Dr. [name] the ID specialist she was sent home on oral Keflex 500 mg 3 times a day for 2 weeks. he will consider an MRI in the outpatient setting to completely rule out osteomyelitis of the spine. 10/03/2015 --readmitted to hospital on 09/23/2015 for general debility, lethargy and possible sepsis and workup was in progress. Seen by Dr. [name] and he would also like a workup for underlying malignancy as she s had previous ultrasounds of the breasts suggesting findings of  concern. Workup done so  far does not suggest deep bony infection. She was treated for a pneumonia and received Zosyn and vancomycin. She had been recommended Cipro 500 twice a day and doxycycline 100 mg twice a day once she was to be [place] home. The antibiotics were to be stopped on 10/07/2015. Objective Constitutional Pulse regular. Respirations normal and unlabored. Afebrile. [name], [name] H. ([id number]) Vitals Time Taken: 3:39 PM, Height: 65 in, Weight: 100 lbs, BMI: 16.6, Pulse: 130 bpm, Respiratory Rate: 18 breaths/min, Blood Pressure: 113/82 mmHg. Eyes Nonicteric. Reactive to light. Ears, Nose, Mouth, and Throat Lips, teeth, and gums WNL.Marland Kitchen Moist mucosa without lesions. Neck supple and nontender. No palpable supraclavicular or cervical adenopathy. Normal sized without goiter. Respiratory WNL. No retractions.. Breath sounds WNL, No rubs, rales, rhonchi, or wheeze.. Cardiovascular Heart rhythm and rate regular, no murmur or gallop.. Pedal Pulses WNL. No clubbing, cyanosis or edema. Chest Breasts symmetical and no nipple discharge.. Breast tissue WNL, no masses, lumps, or tenderness.. Gastrointestinal (GI) Abdomen without masses or tenderness.. No liver or spleen enlargement or tenderness.. Genitourinary (GU) No hydrocele, spermatocele, tenderness of the cord, or testicular mass.Marland Kitchen Penis without lesions.Lowella Fairy without lesions. No cystocele, or rectocele. Pelvic support intact, no discharge.Marland Kitchen Urethra without masses, tenderness or scarring.Marland Kitchen Lymphatic No adneopathy. No adenopathy. No adenopathy. Musculoskeletal Adexa without tenderness or enlargement.. Digits and nails w/o clubbing, cyanosis, infection, petechiae, ischemia, or inflammatory conditions.Marland Kitchen Psychiatric Judgement and insight Intact.. No evidence of depression, anxiety, or agitation.. General Notes: thoracic spine looks much worse than it was last time I saw her and has increased in size and the significant amount of slough. The right hip is  now definitely a stage III pressure ulcer and will need Santyl ointment locally. The rest of the pressure injuries was stage II on the right medial ankle left lateral ankle and the left and right hip. she is also a new stage II pressure injury to the right heel. Integumentary (Hair, Skin) No suspicious lesions. No crepitus or fluctuance. No peri-wound warmth or erythema. No masses.. Wound #10 status is Open. Original cause of wound was Gradually Appeared. The wound is located on the Right,Medial Foot. The wound measures 0.9cm length x 0.9cm width x 0.1cm depth; 0.636cm^2 area and Kimoto, Lunabella H. ([id number]) 0.064cm^3 volume. The wound is limited to skin breakdown. There is no tunneling or undermining noted. There is a medium amount of serous drainage noted. The wound margin is flat and intact. There is no granulation within the wound bed. There is a large (67-100%) amount of necrotic tissue within the wound bed including Eschar and Adherent Slough. Wound #11 status is Open. Original cause of wound was Pressure Injury. The wound is located on the Right Calcaneus. The wound measures 1.5cm length x 0.6cm width x 0.1cm depth; 0.707cm^2 area and 0.071cm^3 volume. The wound is limited to skin breakdown. There is no tunneling or undermining noted. There is a medium amount of serous drainage noted. The wound margin is flat and intact. There is large (67-100%) pink granulation within the wound bed. There is no necrotic tissue within the wound bed. The periwound skin appearance exhibited: Maceration, Moist. The periwound skin appearance did not exhibit: Callus, Crepitus, Excoriation, Fluctuance, Friable, Induration, Localized Edema, Rash, Scarring, Dry/Scaly, Atrophie Blanche, Cyanosis, Ecchymosis, Hemosiderin Staining, Mottled, Pallor, Rubor, Erythema. Wound #3 status is Open. Original cause of wound was Pressure Injury. The wound is located on the Midline Back. The wound measures 2cm length x 2.7cm  width x 0.2cm depth; 4.241cm^2  area and 0.848cm^3 volume. The wound is limited to skin breakdown. There is no tunneling or undermining noted. There is a medium amount of serous drainage noted. The wound margin is distinct with the outline attached to the wound base. There is medium (34-66%) red, pink granulation within the wound bed. There is a medium (34-66%) amount of necrotic tissue within the wound bed including Eschar and Adherent Slough. The periwound skin appearance exhibited: Localized Edema, Moist. The periwound skin appearance did not exhibit: Callus, Crepitus, Excoriation, Fluctuance, Friable, Induration, Rash, Scarring, Dry/Scaly, Maceration, Atrophie Blanche, Cyanosis, Ecchymosis, Hemosiderin Staining, Mottled, Pallor, Rubor, Erythema. Periwound temperature was noted as No Abnormality. The periwound has tenderness on palpation. Wound #7 status is Open. Original cause of wound was Pressure Injury. The wound is located on the Right Ischial Tuberosity. The wound measures 1.4cm length x 0.3cm width x 0.1cm depth; 0.33cm^2 area and 0.033cm^3 volume. The wound is limited to skin breakdown. There is no tunneling or undermining noted. There is a medium amount of serosanguineous drainage noted. The wound margin is distinct with the outline attached to the wound base. There is no granulation within the wound bed. There is a large (67- 100%) amount of necrotic tissue within the wound bed including Adherent Slough. The periwound skin appearance exhibited: Localized Edema, Moist. The periwound skin appearance did not exhibit: Callus, Crepitus, Excoriation, Fluctuance, Friable, Induration, Rash, Scarring, Dry/Scaly, Maceration, Atrophie Blanche, Cyanosis, Ecchymosis, Hemosiderin Staining, Mottled, Pallor, Rubor, Erythema. Periwound temperature was noted as No Abnormality. Wound #8 status is Open. Original cause of wound was Gradually Appeared. The wound is located on the Left Malleolus. The wound  measures 1cm length x 1cm width x 0.1cm depth; 0.785cm^2 area and 0.079cm^3 volume. The wound is limited to skin breakdown. There is a medium amount of serous drainage noted. The wound margin is flat and intact. There is no granulation within the wound bed. There is a large (67-100%) amount of necrotic tissue within the wound bed including Eschar. The periwound skin appearance exhibited: Moist. Periwound temperature was noted as No Abnormality. The periwound has tenderness on palpation. Wound #9 status is Open. Original cause of wound was Gradually Appeared. The wound is located on the Left Ischial Tuberosity. The wound measures 1cm length x 2cm width x 0.1cm depth; 1.571cm^2 area and 0.157cm^3 volume. The wound is limited to skin breakdown. There is no tunneling or undermining noted. There is a medium amount of serous drainage noted. The wound margin is flat and intact. There is no granulation within the wound bed. There is a large (67-100%) amount of necrotic tissue within the wound Priestly, Aniqa H. ([id number]) bed including Eschar. The periwound skin appearance exhibited: Moist. Assessment Active Problems ICD-10 E11.621 - Type 2 diabetes mellitus with foot ulcer L89.613 - Pressure ulcer of right heel, stage 3 F17.218 - Nicotine dependence, cigarettes, with other nicotine-induced disorders L89.100 - Pressure ulcer of unspecified part of back, unstageable L89.213 - Pressure ulcer of right hip, stage 3 We have discussed using Santyl to the thoracic spine and the right hip. We will use Aquacel Ag on the right heel and beta iodine on the other dry eschars. Appropriate form padding has also been recommended.. I spoke to the husband in great detail regarding her general deterioration and have discussed various treatment options and advanced directives to be delineated in case she takes a turn for the worse. Plan Wound Cleansing: Wound #10 Right,Medial Foot: Clean wound with Normal  Saline. Wound #11 Right Calcaneus: Clean wound with Normal  Saline. Wound #3 Midline Back: Clean wound with Normal Saline. Wound #7 Right Ischial Tuberosity: Clean wound with Normal Saline. Wound #8 Left Malleolus: Clean wound with Normal Saline. Wound #9 Left Ischial Tuberosity: Clean wound with Normal Saline. Anesthetic: Wound #10 Right,Medial Foot: Topical Lidocaine 4% cream applied to wound bed prior to debridement Wound #11 Right Calcaneus: Topical Lidocaine 4% cream applied to wound bed prior to debridement Wound #3 Midline Back: Topical Lidocaine 4% cream applied to wound bed prior to debridement Reichel, Kjersti H. ([id number]) Wound #7 Right Ischial Tuberosity: Topical Lidocaine 4% cream applied to wound bed prior to debridement Wound #8 Left Malleolus: Topical Lidocaine 4% cream applied to wound bed prior to debridement Wound #9 Left Ischial Tuberosity: Topical Lidocaine 4% cream applied to wound bed prior to debridement Skin Barriers/Peri-Wound Care: Wound #10 Right,Medial Foot: Skin Prep Wound #11 Right Calcaneus: Skin Prep Wound #3 Midline Back: Skin Prep Wound #7 Right Ischial Tuberosity: Skin Prep Wound #8 Left Malleolus: Skin Prep Wound #9 Left Ischial Tuberosity: Skin Prep Primary Wound Dressing: Wound #3 Midline Back: Santyl Ointment Wound #7 Right Ischial Tuberosity: Santyl Ointment Wound #10 Right,Medial Foot: Other: - betadine paint Wound #8 Left Malleolus: Other: - betadine paint Wound #9 Left Ischial Tuberosity: Other: - betadine paint Wound #11 Right Calcaneus: Aquacel Ag Secondary Dressing: Wound #10 Right,Medial Foot: Boardered Foam Dressing Wound #11 Right Calcaneus: Boardered Foam Dressing Wound #3 Midline Back: Boardered Foam Dressing Wound #7 Right Ischial Tuberosity: Boardered Foam Dressing Wound #8 Left Malleolus: Boardered Foam Dressing Wound #9 Left Ischial Tuberosity: Boardered Foam Dressing Dressing Change Frequency: Wound  #10 Right,Medial Foot: Change dressing every other day. Wound #11 Right Calcaneus: Change dressing every other day. Wound #3 Midline Back: Neto, Abryana H. ([id number]) Change dressing every other day. Wound #7 Right Ischial Tuberosity: Change dressing every other day. Wound #8 Left Malleolus: Change dressing every other day. Wound #9 Left Ischial Tuberosity: Change dressing every other day. Follow-up Appointments: Wound #10 Right,Medial Foot: Return Appointment in 1 week. Wound #11 Right Calcaneus: Return Appointment in 1 week. Wound #3 Midline Back: Return Appointment in 1 week. Wound #7 Right Ischial Tuberosity: Return Appointment in 1 week. Wound #8 Left Malleolus: Return Appointment in 1 week. Wound #9 Left Ischial Tuberosity: Return Appointment in 1 week. Home Health: Wound #10 Right,Medial Foot: [place] Visits - [facility] Nurse may visit PRN to address patient s wound care needs. FACE TO FACE ENCOUNTER: MEDICARE and MEDICAID PATIENTS: I certify that this patient is under my care and that I had a face-to-face encounter that meets the physician face-to-face encounter requirements with this patient on this date. The encounter with the patient was in whole or in part for the following MEDICAL CONDITION: (primary reason for [place]) MEDICAL NECESSITY: I certify, that based on my findings, NURSING services are a medically necessary home health service. HOME BOUND STATUS: I certify that my clinical findings support that this patient is homebound (i.e., Due to illness or injury, pt requires aid of supportive devices such as crutches, cane, wheelchairs, walkers, the use of special transportation or the assistance of another person to leave their place of residence. There is a normal inability to leave the home and doing so requires considerable and taxing effort. Other absences are for medical reasons / religious services and are infrequent or of  short duration when for other reasons). If current dressing causes regression in wound condition, may D/C ordered dressing product/s and apply Normal Saline Moist Dressing  daily until next [place] / Other MD appointment. [place] of regression in wound condition at [phone]. Please direct any NON-WOUND related issues/requests for orders to patient's Primary Care Physician Wound #11 Right Calcaneus: [place] Visits - [facility] Nurse may visit PRN to address patient s wound care needs. FACE TO FACE ENCOUNTER: MEDICARE and MEDICAID PATIENTS: I certify that this patient is under my care and that I had a face-to-face encounter that meets the physician face-to-face encounter requirements with this patient on this date. The encounter with the patient was in whole or in part for the following MEDICAL CONDITION: (primary reason for [place]) MEDICAL NECESSITY: I certify, that based on my findings, NURSING services are a medically necessary home health service. HOME BOUND STATUS: I certify that my clinical findings support that this patient is homebound (i.e., Due to illness or injury, pt requires aid of supportive devices such as crutches, cane, wheelchairs, walkers, the use of special transportation or the assistance of another person to leave their place of residence. There is a normal inability to leave the home and doing so requires considerable and taxing effort. Other absences are for medical reasons / religious services and are infrequent or of short duration when for other reasons). Papin, Jenia H. ([id number]) If current dressing causes regression in wound condition, may D/C ordered dressing product/s and apply Normal Saline Moist Dressing daily until next [place] / Other MD appointment. [place] of regression in wound condition at [phone]. Please direct any NON-WOUND related issues/requests for  orders to patient's Primary Care Physician Wound #3 Midline Back: [place] Visits - [facility] Nurse may visit PRN to address patient s wound care needs. FACE TO FACE ENCOUNTER: MEDICARE and MEDICAID PATIENTS: I certify that this patient is under my care and that I had a face-to-face encounter that meets the physician face-to-face encounter requirements with this patient on this date. The encounter with the patient was in whole or in part for the following MEDICAL CONDITION: (primary reason for [place]) MEDICAL NECESSITY: I certify, that based on my findings, NURSING services are a medically necessary home health service. HOME BOUND STATUS: I certify that my clinical findings support that this patient is homebound (i.e., Due to illness or injury, pt requires aid of supportive devices such as crutches, cane, wheelchairs, walkers, the use of special transportation or the assistance of another person to leave their place of residence. There is a normal inability to leave the home and doing so requires considerable and taxing effort. Other absences are for medical reasons / religious services and are infrequent or of short duration when for other reasons). If current dressing causes regression in wound condition, may D/C ordered dressing product/s and apply Normal Saline Moist Dressing daily until next [facility] / Other MD appointment. [place] of regression in wound condition at [phone]. Please direct any NON-WOUND related issues/requests for orders to patient's Primary Care Physician Wound #7 Right Ischial Tuberosity: [place] Visits - [facility] Nurse may visit PRN to address patient s wound care needs. FACE TO FACE ENCOUNTER: MEDICARE and MEDICAID PATIENTS: I certify that this patient is under my care and that I had a face-to-face encounter that meets the physician face-to-face encounter requirements with this  patient on this date. The encounter with the patient was in whole or in part for the following MEDICAL CONDITION: (primary reason for [place])  MEDICAL NECESSITY: I certify, that based on my findings, NURSING services are a medically necessary home health service. HOME BOUND STATUS: I certify that my clinical findings support that this patient is homebound (i.e., Due to illness or injury, pt requires aid of supportive devices such as crutches, cane, wheelchairs, walkers, the use of special transportation or the assistance of another person to leave their place of residence. There is a normal inability to leave the home and doing so requires considerable and taxing effort. Other absences are for medical reasons / religious services and are infrequent or of short duration when for other reasons). If current dressing causes regression in wound condition, may D/C ordered dressing product/s and apply Normal Saline Moist Dressing daily until next [place] / Other MD appointment. [place] of regression in wound condition at [phone]. Please direct any NON-WOUND related issues/requests for orders to patient's Primary Care Physician Wound #8 Left Malleolus: [place] Visits - [facility] Nurse may visit PRN to address patient s wound care needs. FACE TO FACE ENCOUNTER: MEDICARE and MEDICAID PATIENTS: I certify that this patient is under my care and that I had a face-to-face encounter that meets the physician face-to-face encounter requirements with this patient on this date. The encounter with the patient was in whole or in part for the following MEDICAL CONDITION: (primary reason for [place]) MEDICAL NECESSITY: I certify, that based on my findings, NURSING services are a medically necessary home health service. HOME BOUND STATUS: I certify that my clinical findings support that this patient is homebound (i.e., Due to illness or injury,  pt requires aid of supportive devices such as crutches, cane, wheelchairs, walkers, the use of special transportation or the assistance of another person to leave their place of residence. There is a normal inability to leave the home and doing so requires considerable and taxing effort. Other absences are for medical reasons / religious services and are infrequent or of short duration when for other reasons). Snelling, Madora H. ([id number]) If current dressing causes regression in wound condition, may D/C ordered dressing product/s and apply Normal Saline Moist Dressing daily until next [place] / Other MD appointment. [place] of regression in wound condition at [phone]. Please direct any NON-WOUND related issues/requests for orders to patient's Primary Care Physician Wound #9 Left Ischial Tuberosity: [place] Visits - [place] Nurse may visit PRN to address patient s wound care needs. FACE TO FACE ENCOUNTER: MEDICARE and MEDICAID PATIENTS: I certify that this patient is under my care and that I had a face-to-face encounter that meets the physician face-to-face encounter requirements with this patient on this date. The encounter with the patient was in whole or in part for the following MEDICAL CONDITION: (primary reason for [place]) MEDICAL NECESSITY: I certify, that based on my findings, NURSING services are a medically necessary home health service. HOME BOUND STATUS: I certify that my clinical findings support that this patient is homebound (i.e., Due to illness or injury, pt requires aid of supportive devices such as crutches, cane, wheelchairs, walkers, the use of special transportation or the assistance of another person to leave their place of residence. There is a normal inability to leave the home and doing so requires considerable and taxing effort. Other absences are for medical reasons / religious services and are  infrequent or of short duration when for other reasons). If current dressing causes regression in wound condition, may D/C  ordered dressing product/s and apply Normal Saline Moist Dressing daily until next [place] / Other MD appointment. [place] of regression in wound condition at [phone]. Please direct any NON-WOUND related issues/requests for orders to patient's Primary Care Physician We have discussed using Santyl to the thoracic spine and the right hip. We will use Aquacel Ag on the right heel and beta iodine on the other dry eschars. Appropriate form padding has also been recommended.. I spoke to the husband in great detail regarding her general deterioration and have discussed various treatment options and advanced directives to be delineated in case she takes a turn for the worse. Electronic Signature(s) Signed: 10/06/2015 4:15:03 PM By: Christin Fudge MD, FACS Previous Signature: 10/06/2015 4:14:46 PM Version By: Christin Fudge MD, FACS Previous Signature: 10/03/2015 4:35:40 PM Version By: Christin Fudge MD, FACS Entered By: Christin Fudge on 10/06/2015 16:15:03 Narula, Falana H. ([id number]) -------------------------------------------------------------------------------- SuperBill Details Patient Name: [name], Carliss H. Date of Service: 10/03/2015 Medical Record Number: [id number] Patient Account Number: [id number] Date of Birth/Sex: 1945-10-19 (69 y.o. 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, stage 3 Facility Procedures CPT4 Code: [id number] Description: [id number] - WOUND CARE VISIT-LEV 5 EST PT Modifier: Quantity: 1 Physician Procedures CPT4 Code Description: [id number] - WC PHYS LEVEL 3 - EST PT ICD-10 Description Diagnosis 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

## 2015-10-10 ENCOUNTER — Encounter: Payer: Medicare Other | Admitting: Surgery

## 2015-10-10 DIAGNOSIS — E11621 Type 2 diabetes mellitus with foot ulcer: Secondary | ICD-10-CM | POA: Diagnosis not present

## 2015-10-11 NOTE — Progress Notes (Addendum)
[name], [name] ([id number]) Visit Report for 10/10/2015 Chief Complaint Document Details Patient Name: [name], [name] Date of Service: 10/10/2015 3:30 PM Medical Record Number: [id number] Patient Account Number: [id number] Date of Birth/Sex: 03-17-1946 (69 y.o. Female) Treating RN: Ahmed Prima Primary Care Physician: Paulita Cradle Other Clinician: Referring Physician: Paulita Cradle Treating Physician/Extender: Frann Rider in Treatment: 30 Information Obtained from: Patient Chief Complaint Patient presents to the wound care center for a consult due non healing wound. Ulcers on the right elbow and the right heel for about 1 month. Electronic Signature(s) Signed: 10/10/2015 4:46:16 PM By: Christin Fudge MD, FACS Entered By: Christin Fudge on 10/10/2015 16:46:16 [name], [name] ([id number]) -------------------------------------------------------------------------------- HPI Details Patient Name: [name], [name] Date of Service: 10/10/2015 3:30 PM Medical Record Number: [id number] Patient Account Number: [id number] Date of Birth/Sex: 04/04/1946 (69 y.o. Female) Treating RN: Carolyne Fiscal, Debi Primary Care Physician: Paulita Cradle Other Clinician: Referring Physician: Paulita Cradle Treating Physician/Extender: Frann Rider in Treatment: 30 History of Present Illness Location: Ulceration on the right heel and the right elbow. Quality: Patient reports experiencing a dull pain to affected area(s). Severity: Patient states wound (s) are getting better. Duration: Patient has had the wound for < 4 weeks prior to presenting for treatment Timing: Pain in wound is Intermittent (comes and goes Context: The wound appeared gradually over time Modifying Factors: Consults to this date include:Augmentin and Bactrim and also some heel protection with duoderm Associated Signs and Symptoms: Patient reports having difficulty standing for long periods. HPI Description:  70 year old female with history of peripheral neuropathy, history of diet controlled diabetes mellitus type 2, history of alcoholism here for wound consult sent by her PCP Dr. Sherilyn Cooter. She has pressure ulcers at her right elbow, bilateral heels. Plain films of right calcaneus without acute bony process. Patient started by PCP on Augmentin, Bactrim as per orders, DuoDerm dressings applied - reports some improvement in her ulcer since last seen. Denies fever, chills, nausea, vomiting, diarrhea. She had a right humerus fracture in the middle of May and has had no surgery and arm is in a sling. She is also been laying in the bed for quite a while. Past medical history significant for essential hypertension, osteoporosis, peripheral neuropathy, alcoholism, ataxia, personal history of breast cancer treated with surgery chemotherapy and radiation and this was done in December 2010. she is also status post laparoscopic cholecystectomy, pilonidal cyst excision, subcutaneous port placement, partial mastectomy on the left side, skin cancer removal. 03/21/2015 -- she says overall she's been doing better and continues to smoke about 15 cigarettes a day. 03/21/2015 - her orthopedic doctor has said she may require surgery for her right humerus fracture. 04/04/2015 -- her orthopedic surgery has been scheduled for August 11. 04/18/2015 -- she is doing fine as far as her elbow and her right heel goes but she has developed some redness over prominence on her thoracic spine and wanted me to take a look at this. 05/02/2015 -- she had her surgery done and now is in a sling and support. Her back has developed a pressure injury of undetermined stage. She seems to be in better spirits. 05/16/2015 -- last week her right heel was looking great and we had healed it out, but she has not been offloading appropriately and has a deep tissue injury on the right heel again. The area on her right elbow has opened out with slough and  the area in the thoracic spine is also getting worse. 05/30/2015 -- she has developed  2 new ulcerations one on her left ischial tuberosity and one on the sacral region. She is still working on getting up smoking but is also unable to take her vitamins as she says she develops a diarrhea when she takes vitamins. She has increased her intake of proteins. 06/06/2015 -- the patient's husband manages to get her a low air loss mattress with initiating pressure but [name], [name] ([id number]) did not know how to use it exactly and the patient was not happy about using it. Overall she says she's been feeling better. 07/11/2015 -- . Discussed a surgical opinion for debridement and application of a wound VAC, 2 weeks ago but the patient has been reluctant to get a surgical opinion as she wants to avoid surgery. 07/18/2015 -- they have an appointment to see Dr. Tamala Julian this coming Wednesday. 07/29/2015 -- they saw Dr. Tamala Julian in his office and he did a debridement of the wound and removed significant amount of slough. This is in addition to the debridement I had done previously on Friday where a large amount of the eschar was removed. He did not recommend the application of wound VAC. Addendum : Dr. Thompson Caul note was reviewed via EPIC and details noted as above. 08/05/2015 -- over the last week she has developed a another pressure injury to her right hip and has had significant discoloration of the skin and an eschar there. 08/15/2015 -- she is still smoking about a pack of cigarettes a day and does not seem to want to quit. They have not been able to talk to the vendor regarding the air mattress and I will ask them to get in touch again. She is reluctant to take vitamins and does admit her nutrition is poor. 08/22/2015 -- she has been unable to tolerate her vitamins and continues to smoke. They're working on getting a low air loss mattress and have been speaking with the vendor. 09/19/2015 -- since her last  visit and was admitted to the hospital between 09/09/2015 and 09/16/2015. She was thought to have an active sepsis possibly from one of her decubitus ulcers but nothing was grown except for an MSSA from her thoracic spine region. CT of this area did not show any osteomyelitis. Been given IV antibiotics which included vancomycin and Zosyn in the hospital under the care of Dr. [name] the ID specialist she was sent home on oral Keflex 500 mg 3 times a day for 2 weeks. he will consider an MRI in the outpatient setting to completely rule out osteomyelitis of the spine. 10/03/2015 --readmitted to hospital on 09/23/2015 for general debility, lethargy and possible sepsis and workup was in progress. Seen by Dr. [name] and he would also like a workup for underlying malignancy as sheos had previous ultrasounds of the breasts suggesting findings of concern. Workup done so far does not suggest deep bony infection. She was treated for a pneumonia and received Zosyn and vancomycin. She had been recommended Cipro 500 twice a day and doxycycline 100 mg twice a day once she was to be [place] home. The antibiotics were to be stopped on 10/07/2015. Electronic Signature(s) Signed: 10/10/2015 4:46:25 PM By: Christin Fudge MD, FACS Entered By: Christin Fudge on 10/10/2015 16:46:25 [name], [name] ([id number]) -------------------------------------------------------------------------------- Physical Exam Details Patient Name: [name], [name] Date of Service: 10/10/2015 3:30 PM Medical Record Number: [id number] Patient Account Number: [id number] Date of Birth/Sex: Jun 09, 1946 (69 y.o. Female) Treating RN: Carolyne Fiscal, Debi Primary Care Physician: Paulita Cradle Other Clinician: Referring Physician: Paulita Cradle Treating  Physician/Extender: Christin Fudge Weeks in Treatment: 30 Constitutional . Pulse regular. Respirations normal and unlabored. Afebrile. . Eyes Nonicteric. Reactive to light. Ears, Nose,  Mouth, and Throat Lips, teeth, and gums WNL.Marland Kitchen Moist mucosa without lesions. Neck supple and nontender. No palpable supraclavicular or cervical adenopathy. Normal sized without goiter. Respiratory WNL. No retractions.. Breath sounds WNL, No rubs, rales, rhonchi, or wheeze.. Cardiovascular Heart rhythm and rate regular, no murmur or gallop.. Pedal Pulses WNL. No clubbing, cyanosis or edema. Chest Breasts symmetical and no nipple discharge.. Breast tissue WNL, no masses, lumps, or tenderness.. Lymphatic No adneopathy. No adenopathy. No adenopathy. Musculoskeletal Adexa without tenderness or enlargement.. Digits and nails w/o clubbing, cyanosis, infection, petechiae, ischemia, or inflammatory conditions.. Integumentary (Hair, Skin) No suspicious lesions. No crepitus or fluctuance. No peri-wound warmth or erythema. No masses.Marland Kitchen Psychiatric Judgement and insight Intact.. No evidence of depression, anxiety, or agitation.. Notes she has some fungal infection around the area of the thoracic spine and we will treat this appropriately. The right hip and the left hip are stage III pressure injuries and will need Santyl ointment locally. The one on her right medial ankle and the left lateral ankle as stage II and the right heel has a stage II pressure injury. Electronic Signature(s) Signed: 10/10/2015 4:47:11 PM By: Christin Fudge MD, FACS Entered By: Christin Fudge on 10/10/2015 16:47:11 [name], [name] ([id number]) -------------------------------------------------------------------------------- Physician Orders Details Patient Name: [name], Cay H. Date of Service: 10/10/2015 3:30 PM Medical Record Number: [id number] Patient Account Number: [id number] Date of Birth/Sex: 07-11-1946 (69 y.o. Female) Treating RN: Carolyne Fiscal, Debi Primary Care Physician: Paulita Cradle Other Clinician: Referring Physician: Paulita Cradle Treating Physician/Extender: Frann Rider in Treatment: 30 Verbal  / Phone Orders: No Diagnosis Coding Patient Medications Allergies: erythromycin, codeine, levaquin Notifications Medication Indication Start End Lotrisone 10/10/2015 DOSE topical 1 %-0.05 % cream - cream topical apply daily Electronic Signature(s) Signed: 10/10/2015 4:45:44 PM By: Christin Fudge MD, FACS Entered By: Christin Fudge on 10/10/2015 16:45:44 [name], [name] ([id number]) -------------------------------------------------------------------------------- Problem List Details Patient Name: [name], [name] Date of Service: 10/10/2015 3:30 PM Medical Record Number: [id number] Patient Account Number: [id number] Date of Birth/Sex: Oct 31, 1945 (69 y.o. Female) Treating RN: Ahmed Prima Primary Care Physician: Paulita Cradle Other Clinician: Referring Physician: Paulita Cradle Treating Physician/Extender: Frann Rider in Treatment: 30 Active Problems ICD-10 Encounter Code Description Active Date Diagnosis E11.621 Type 2 diabetes mellitus with foot ulcer 03/13/2015 Yes L89.613 Pressure ulcer of right heel, stage 3 03/13/2015 Yes F17.218 Nicotine dependence, cigarettes, with other nicotine- 03/13/2015 Yes induced disorders L89.100 Pressure ulcer of unspecified part of back, unstageable 05/02/2015 Yes L89.213 Pressure ulcer of right hip, stage 3 08/05/2015 Yes Inactive Problems Resolved Problems ICD-10 Code Description Active Date Resolved Date L89.013 Pressure ulcer of right elbow, stage 3 03/13/2015 03/13/2015 [id number] Pressure ulcer of left hip, stage 2 05/30/2015 05/30/2015 L89.153 Pressure ulcer of sacral region, stage 3 05/30/2015 05/30/2015 Electronic Signature(s) [name], Camren Lemmie Evens ([id number]) Signed: 10/10/2015 4:46:09 PM By: Christin Fudge MD, FACS Entered By: Christin Fudge on 10/10/2015 16:46:09 Wyszynski, Felicitas H. ([id number]) -------------------------------------------------------------------------------- Progress Note Details Patient Name: [name], [name] Date of  Service: 10/10/2015 3:30 PM Medical Record Number: [id number] Patient Account Number: [id number] Date of Birth/Sex: [birth year] (69 y.o. Female) Treating RN: Ahmed Prima Primary Care Physician: Paulita Cradle Other Clinician: Referring Physician: Paulita Cradle Treating Physician/Extender: Frann Rider in Treatment: 30 Subjective Chief Complaint Information obtained from Patient Patient presents to the wound care center for a consult due non healing wound. Ulcers on  the right elbow and the right heel for about 1 month. History of Present Illness (HPI) The following HPI elements were documented for the patient's wound: Location: Ulceration on the right heel and the right elbow. Quality: Patient reports experiencing a dull pain to affected area(s). Severity: Patient states wound (s) are getting better. Duration: Patient has had the wound for < 4 weeks prior to presenting for treatment Timing: Pain in wound is Intermittent (comes and goes Context: The wound appeared gradually over time Modifying Factors: Consults to this date include:Augmentin and Bactrim and also some heel protection with duoderm Associated Signs and Symptoms: Patient reports having difficulty standing for long periods. 70 year old female with history of peripheral neuropathy, history of diet controlled diabetes mellitus type 2, history of alcoholism here for wound consult sent by her PCP Dr. Sherilyn Cooter. She has pressure ulcers at her right elbow, bilateral heels. Plain films of right calcaneus without acute bony process. Patient started by PCP on Augmentin, Bactrim as per orders, DuoDerm dressings applied - reports some improvement in her ulcer since last seen. Denies fever, chills, nausea, vomiting, diarrhea. She had a right humerus fracture in the middle of May and has had no surgery and arm is in a sling. She is also been laying in the bed for quite a while. Past medical history significant for essential  hypertension, osteoporosis, peripheral neuropathy, alcoholism, ataxia, personal history of breast cancer treated with surgery chemotherapy and radiation and this was done in December 2010. she is also status post laparoscopic cholecystectomy, pilonidal cyst excision, subcutaneous port placement, partial mastectomy on the left side, skin cancer removal. 03/21/2015 -- she says overall she's been doing better and continues to smoke about 15 cigarettes a day. 03/21/2015 - her orthopedic doctor has said she may require surgery for her right humerus fracture. 04/04/2015 -- her orthopedic surgery has been scheduled for August 11. 04/18/2015 -- she is doing fine as far as her elbow and her right heel goes but she has developed some redness over prominence on her thoracic spine and wanted me to take a look at this. [name], [name] ([id number]) 05/02/2015 -- she had her surgery done and now is in a sling and support. Her back has developed a pressure injury of undetermined stage. She seems to be in better spirits. 05/16/2015 -- last week her right heel was looking great and we had healed it out, but she has not been offloading appropriately and has a deep tissue injury on the right heel again. The area on her right elbow has opened out with slough and the area in the thoracic spine is also getting worse. 05/30/2015 -- she has developed 2 new ulcerations one on her left ischial tuberosity and one on the sacral region. She is still working on getting up smoking but is also unable to take her vitamins as she says she develops a diarrhea when she takes vitamins. She has increased her intake of proteins. 06/06/2015 -- the patient's husband manages to get her a low air loss mattress with initiating pressure but did not know how to use it exactly and the patient was not happy about using it. Overall she says she's been feeling better. 07/11/2015 -- . Discussed a surgical opinion for debridement and application of  a wound VAC, 2 weeks ago but the patient has been reluctant to get a surgical opinion as she wants to avoid surgery. 07/18/2015 -- they have an appointment to see Dr. Tamala Julian this coming Wednesday. 07/29/2015 -- they saw Dr.  Smith in his office and he did a debridement of the wound and removed significant amount of slough. This is in addition to the debridement I had done previously on Friday where a large amount of the eschar was removed. He did not recommend the application of wound VAC. Addendum : Dr. Thompson Caul note was reviewed via EPIC and details noted as above. 08/05/2015 -- over the last week she has developed a another pressure injury to her right hip and has had significant discoloration of the skin and an eschar there. 08/15/2015 -- she is still smoking about a pack of cigarettes a day and does not seem to want to quit. They have not been able to talk to the vendor regarding the air mattress and I will ask them to get in touch again. She is reluctant to take vitamins and does admit her nutrition is poor. 08/22/2015 -- she has been unable to tolerate her vitamins and continues to smoke. They're working on getting a low air loss mattress and have been speaking with the vendor. 09/19/2015 -- since her last visit and was admitted to the hospital between 09/09/2015 and 09/16/2015. She was thought to have an active sepsis possibly from one of her decubitus ulcers but nothing was grown except for an MSSA from her thoracic spine region. CT of this area did not show any osteomyelitis. Been given IV antibiotics which included vancomycin and Zosyn in the hospital under the care of Dr. [name] the ID specialist she was sent home on oral Keflex 500 mg 3 times a day for 2 weeks. he will consider an MRI in the outpatient setting to completely rule out osteomyelitis of the spine. 10/03/2015 --readmitted to hospital on 09/23/2015 for general debility, lethargy and possible sepsis and workup was in  progress. Seen by Dr. [name] and he would also like a workup for underlying malignancy as she s had previous ultrasounds of the breasts suggesting findings of concern. Workup done so far does not suggest deep bony infection. She was treated for a pneumonia and received Zosyn and vancomycin. She had been recommended Cipro 500 twice a day and doxycycline 100 mg twice a day once she was to be [place] home. The antibiotics were to be stopped on 10/07/2015. Objective Constitutional Pulse regular. Respirations normal and unlabored. Afebrile. Labine, Atalya H. ([id number]) Vitals Time Taken: 4:19 PM, Height: 65 in, Weight: 100 lbs, BMI: 16.6, Temperature: 98.1 F, Pulse: 108 bpm, Respiratory Rate: 20 breaths/min, Blood Pressure: 132/77 mmHg. Eyes Nonicteric. Reactive to light. Ears, Nose, Mouth, and Throat Lips, teeth, and gums WNL.Marland Kitchen Moist mucosa without lesions. Neck supple and nontender. No palpable supraclavicular or cervical adenopathy. Normal sized without goiter. Respiratory WNL. No retractions.. Breath sounds WNL, No rubs, rales, rhonchi, or wheeze.. Cardiovascular Heart rhythm and rate regular, no murmur or gallop.. Pedal Pulses WNL. No clubbing, cyanosis or edema. Chest Breasts symmetical and no nipple discharge.. Breast tissue WNL, no masses, lumps, or tenderness.. Lymphatic No adneopathy. No adenopathy. No adenopathy. Musculoskeletal Adexa without tenderness or enlargement.. Digits and nails w/o clubbing, cyanosis, infection, petechiae, ischemia, or inflammatory conditions.Marland Kitchen Psychiatric Judgement and insight Intact.. No evidence of depression, anxiety, or agitation.. General Notes: she has some fungal infection around the area of the thoracic spine and we will treat this appropriately. The right hip and the left hip are stage III pressure injuries and will need Santyl ointment locally. The one on her right medial ankle and the left lateral ankle as stage II and the right heel has a  stage II pressure injury. Integumentary (Hair, Skin) No suspicious lesions. No crepitus or fluctuance. No peri-wound warmth or erythema. No masses.. Wound #10 status is Open. Original cause of wound was Gradually Appeared. The wound is located on the Right,Medial Foot. The wound measures 0.4cm length x 0.4cm width x 0.1cm depth; 0.126cm^2 area and 0.013cm^3 volume. The wound is limited to skin breakdown. There is no tunneling or undermining noted. There is a medium amount of serous drainage noted. The wound margin is flat and intact. There is no granulation within the wound bed. There is a large (67-100%) amount of necrotic tissue within the wound bed including Eschar. The periwound skin appearance exhibited: Erythema. The surrounding wound skin color is noted with erythema which is circumferential. Periwound temperature was noted as No Abnormality. The periwound has tenderness on palpation. Wound #11 status is Open. Original cause of wound was Pressure Injury. The wound is located on the [place], Sarely H. ([id number]) Right Calcaneus. The wound measures 2.6cm length x 6.6cm width x 0.1cm depth; 13.477cm^2 area and 1.348cm^3 volume. The wound is limited to skin breakdown. There is no tunneling or undermining noted. There is a medium amount of serous drainage noted. The wound margin is flat and intact. There is large (67-100%) pink granulation within the wound bed. There is a small (1-33%) amount of necrotic tissue within the wound bed including Eschar and Adherent Slough. The periwound skin appearance exhibited: Maceration, Moist. The periwound skin appearance did not exhibit: Callus, Crepitus, Excoriation, Fluctuance, Friable, Induration, Localized Edema, Rash, Scarring, Dry/Scaly, Atrophie Blanche, Cyanosis, Ecchymosis, Hemosiderin Staining, Mottled, Pallor, Rubor, Erythema. Wound #3 status is Open. Original cause of wound was Pressure Injury. The wound is located on the Midline Back. The wound  measures 2cm length x 3cm width x 0.1cm depth; 4.712cm^2 area and 0.471cm^3 volume. The wound is limited to skin breakdown. There is no tunneling or undermining noted. There is a medium amount of serous drainage noted. The wound margin is distinct with the outline attached to the wound base. There is medium (34-66%) red, pink granulation within the wound bed. There is a medium (34-66%) amount of necrotic tissue within the wound bed including Eschar and Adherent Slough. The periwound skin appearance exhibited: Localized Edema, Moist, Erythema. The periwound skin appearance did not exhibit: Callus, Crepitus, Excoriation, Fluctuance, Friable, Induration, Rash, Scarring, Dry/Scaly, Maceration, Atrophie Blanche, Cyanosis, Ecchymosis, Hemosiderin Staining, Mottled, Pallor, Rubor. The surrounding wound skin color is noted with erythema which is circumferential. Periwound temperature was noted as No Abnormality. The periwound has tenderness on palpation. Wound #7 status is Open. Original cause of wound was Pressure Injury. The wound is located on the Right Ischial Tuberosity. The wound measures 0.3cm length x 1.2cm width x 0.1cm depth; 0.283cm^2 area and 0.028cm^3 volume. The wound is limited to skin breakdown. There is no tunneling or undermining noted. There is a medium amount of serosanguineous drainage noted. The wound margin is distinct with the outline attached to the wound base. There is small (1-33%) pink granulation within the wound bed. There is a large (67-100%) amount of necrotic tissue within the wound bed including Adherent Slough. The periwound skin appearance exhibited: Localized Edema, Moist. The periwound skin appearance did not exhibit: Callus, Crepitus, Excoriation, Fluctuance, Friable, Induration, Rash, Scarring, Dry/Scaly, Maceration, Atrophie Blanche, Cyanosis, Ecchymosis, Hemosiderin Staining, Mottled, Pallor, Rubor, Erythema. Periwound temperature was noted as No  Abnormality. Wound #8 status is Open. Original cause of wound was Gradually Appeared. The wound is located on the Left Malleolus. The wound  measures 1.8cm length x 1.1cm width x 0.1cm depth; 1.555cm^2 area and 0.156cm^3 volume. The wound is limited to skin breakdown. There is no tunneling or undermining noted. There is a medium amount of serous drainage noted. The wound margin is flat and intact. There is small (1-33%) red, pink granulation within the wound bed. There is a large (67-100%) amount of necrotic tissue within the wound bed including Eschar. The periwound skin appearance exhibited: Moist, Erythema. The surrounding wound skin color is noted with erythema which is circumferential. Periwound temperature was noted as No Abnormality. The periwound has tenderness on palpation. Wound #9 status is Open. Original cause of wound was Gradually Appeared. The wound is located on the Left Ischial Tuberosity. The wound measures 1cm length x 1.7cm width x 0.1cm depth; 1.335cm^2 area and 0.134cm^3 volume. The wound is limited to skin breakdown. There is no tunneling or undermining noted. There is a medium amount of serous drainage noted. The wound margin is flat and intact. There is no granulation within the wound bed. There is a large (67-100%) amount of necrotic tissue within the wound bed including Eschar and Adherent Slough. The periwound skin appearance exhibited: Moist. [name], [name] 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 Wound #11 Right Calcaneus: Clean wound with Normal Saline. Wound #3 Midline Back: Clean  wound with Normal Saline. Wound #7 Right Ischial Tuberosity: Clean wound with Normal Saline. Wound #8 Left Malleolus: Clean wound with Normal Saline. Wound #9 Left Ischial Tuberosity: Clean wound with Normal Saline. Anesthetic: Wound #10 Right,Medial Foot: Topical Lidocaine 4% cream applied to wound bed prior to debridement Wound #11 Right Calcaneus: Topical Lidocaine 4% cream applied to wound bed prior to debridement Wound #3 Midline Back: Topical Lidocaine 4% cream applied to wound bed prior to debridement Wound #7 Right Ischial Tuberosity: Topical Lidocaine 4% cream applied to wound bed prior to debridement Wound #8 Left Malleolus: Topical Lidocaine 4% cream applied to wound bed prior to debridement Wound #9 Left Ischial Tuberosity: Topical Lidocaine 4% cream applied to wound bed prior to debridement Skin Barriers/Peri-Wound Care: Wound #10 Right,Medial Foot: Skin Prep Wound #11 Right Calcaneus: Skin Prep Wound #3 Midline Back: [place], Amana H. ([id number]) Skin Prep Wound #7 Right Ischial Tuberosity: Skin Prep Wound #8 Left Malleolus: Skin Prep Wound #9 Left Ischial Tuberosity: Skin Prep Primary Wound Dressing: Wound #10 Right,Medial Foot: Other: - betadine paint Wound #11 Right Calcaneus: Aquacel Ag Wound #3 Midline Back: Santyl Ointment Other: - Lotrisome around the wound Wound #7 Right Ischial Tuberosity: Santyl Ointment Wound #8 Left Malleolus: Other: - betadine paint Wound #9 Left Ischial Tuberosity: Santyl Ointment Secondary Dressing: Wound #10 Right,Medial Foot: Boardered Foam Dressing Wound #11 Right Calcaneus: Boardered Foam Dressing Wound #3 Midline Back: Boardered Foam Dressing Wound #7 Right Ischial Tuberosity: Boardered Foam Dressing Wound #8 Left Malleolus: Boardered Foam Dressing Wound #9 Left Ischial Tuberosity: Boardered Foam Dressing Dressing Change Frequency: Wound #10 Right,Medial Foot: Change dressing every day. Wound #11 Right  Calcaneus: Change dressing every day. Wound #3 Midline Back: Change dressing every day. Wound #7 Right Ischial Tuberosity: Change dressing every day. Wound #8 Left Malleolus: Change dressing every day. Wound #9 Left Ischial Tuberosity:  Change dressing every day. Follow-up Appointments: Wound #10 Right,Medial Foot: Return Appointment in 1 week. Wound #11 Right Calcaneus: [place], Calleigh H. ([id number]) Return Appointment in 1 week. Wound #3 Midline Back: Return Appointment in 1 week. Wound #7 Right Ischial Tuberosity: Return Appointment in 1 week. Wound #8 Left Malleolus: Return Appointment in 1 week. Wound #9 Left Ischial Tuberosity: Return Appointment in 1 week. Home Health: Wound #10 Right,Medial Foot: [place] [facility] - [facility] Nurse may visit PRN to address patient s wound care needs. FACE TO FACE ENCOUNTER: MEDICARE and MEDICAID PATIENTS: I certify that this patient is under my care and that I had a face-to-face encounter that meets the physician face-to-face encounter requirements with this patient on this date. The encounter with the patient was in whole or in part for the following MEDICAL CONDITION: (primary reason for [place]) MEDICAL NECESSITY: I certify, that based on my findings, NURSING services are a medically necessary home health service. HOME BOUND STATUS: I certify that my clinical findings support that this patient is homebound (i.e., Due to illness or injury, pt requires aid of supportive devices such as crutches, cane, wheelchairs, walkers, the use of special transportation or the assistance of another person to leave their place of residence. There is a normal inability to leave the home and doing so requires considerable and taxing effort. Other absences are for medical reasons / religious services and are infrequent or of short duration when for other reasons). If current dressing causes regression in wound condition, may D/C  ordered dressing product/s and apply Normal Saline Moist Dressing daily until next [place] / Other MD appointment. [place] of regression in wound condition at [phone]. Please direct any NON-WOUND related issues/requests for orders to patient's Primary Care Physician Wound #11 Right Calcaneus: [place] [facility] - [facility] Nurse may visit PRN to address patient s wound care needs. FACE TO FACE ENCOUNTER: MEDICARE and MEDICAID PATIENTS: I certify that this patient is under my care and that I had a face-to-face encounter that meets the physician face-to-face encounter requirements with this patient on this date. The encounter with the patient was in whole or in part for the following MEDICAL CONDITION: (primary reason for [place]) MEDICAL NECESSITY: I certify, that based on my findings, NURSING services are a medically necessary home health service. HOME BOUND STATUS: I certify that my clinical findings support that this patient is homebound (i.e., Due to illness or injury, pt requires aid of supportive devices such as crutches, cane, wheelchairs, walkers, the use of special transportation or the assistance of another person to leave their place of residence. There is a normal inability to leave the home and doing so requires considerable and taxing effort. Other absences are for medical reasons / religious services and are infrequent or of short duration when for other reasons). If current dressing causes regression in wound condition, may D/C ordered dressing product/s and apply Normal Saline Moist Dressing daily until next [place] / Other MD appointment. [place] of regression in wound condition at [phone]. Please direct any NON-WOUND related issues/requests for orders to patient's Primary Care Physician Wound #3 Midline Back: [place] Visits - [facility] Nurse may visit  PRN to address patient s wound care needs. FACE TO FACE ENCOUNTER: MEDICARE and MEDICAID PATIENTS: I certify that this patient is under my care and that I had a face-to-face encounter that meets the physician face-to-face  encounter requirements with this patient on this date. The encounter with the patient was in whole or in part for the following MEDICAL CONDITION: (primary reason for [place]) MEDICAL NECESSITY: I certify, Buckle, Brinkley H. ([id number]) that based on my findings, NURSING services are a medically necessary home health service. HOME BOUND STATUS: I certify that my clinical findings support that this patient is homebound (i.e., Due to illness or injury, pt requires aid of supportive devices such as crutches, cane, wheelchairs, walkers, the use of special transportation or the assistance of another person to leave their place of residence. There is a normal inability to leave the home and doing so requires considerable and taxing effort. Other absences are for medical reasons / religious services and are infrequent or of short duration when for other reasons). If current dressing causes regression in wound condition, may D/C ordered dressing product/s and apply Normal Saline Moist Dressing daily until next [place] / Other MD appointment. [place] of regression in wound condition at [phone]. Please direct any NON-WOUND related issues/requests for orders to patient's Primary Care Physician Wound #7 Right Ischial Tuberosity: [place] Visits - [facility] Nurse may visit PRN to address patient s wound care needs. FACE TO FACE ENCOUNTER: MEDICARE and MEDICAID PATIENTS: I certify that this patient is under my care and that I had a face-to-face encounter that meets the physician face-to-face encounter requirements with this patient on this date. The encounter with the patient was in whole or in part for the following MEDICAL  CONDITION: (primary reason for [place]) MEDICAL NECESSITY: I certify, that based on my findings, NURSING services are a medically necessary home health service. HOME BOUND STATUS: I certify that my clinical findings support that this patient is homebound (i.e., Due to illness or injury, pt requires aid of supportive devices such as crutches, cane, wheelchairs, walkers, the use of special transportation or the assistance of another person to leave their place of residence. There is a normal inability to leave the home and doing so requires considerable and taxing effort. Other absences are for medical reasons / religious services and are infrequent or of short duration when for other reasons). If current dressing causes regression in wound condition, may D/C ordered dressing product/s and apply Normal Saline Moist Dressing daily until next [place] / Other MD appointment. [place] of regression in wound condition at [phone]. Please direct any NON-WOUND related issues/requests for orders to patient's Primary Care Physician Wound #8 Left Malleolus: [place] Visits - [facility] Nurse may visit PRN to address patient s wound care needs. FACE TO FACE ENCOUNTER: MEDICARE and MEDICAID PATIENTS: I certify that this patient is under my care and that I had a face-to-face encounter that meets the physician face-to-face encounter requirements with this patient on this date. The encounter with the patient was in whole or in part for the following MEDICAL CONDITION: (primary reason for [place]) MEDICAL NECESSITY: I certify, that based on my findings, NURSING services are a medically necessary home health service. HOME BOUND STATUS: I certify that my clinical findings support that this patient is homebound (i.e., Due to illness or injury, pt requires aid of supportive devices such as crutches, cane, wheelchairs, walkers, the use of special  transportation or the assistance of another person to leave their place of residence. There is a normal inability to leave the home and doing so requires considerable and taxing effort. Other  absences are for medical reasons / religious services and are infrequent or of short duration when for other reasons). If current dressing causes regression in wound condition, may D/C ordered dressing product/s and apply Normal Saline Moist Dressing daily until next [place] / Other MD appointment. [place] of regression in wound condition at [phone]. Please direct any NON-WOUND related issues/requests for orders to patient's Primary Care Physician Wound #9 Left Ischial Tuberosity: [place] Visits - [facility] Nurse may visit PRN to address patient s wound care needs. FACE TO FACE ENCOUNTER: MEDICARE and MEDICAID PATIENTS: I certify that this patient is under my care and that I had a face-to-face encounter that meets the physician face-to-face encounter requirements with this patient on this date. The encounter with the patient was in whole or in part for the following MEDICAL CONDITION: (primary reason for [place]) MEDICAL NECESSITY: I certify, Meyers, Jasa H. ([id number]) that based on my findings, NURSING services are a medically necessary home health service. HOME BOUND STATUS: I certify that my clinical findings support that this patient is homebound (i.e., Due to illness or injury, pt requires aid of supportive devices such as crutches, cane, wheelchairs, walkers, the use of special transportation or the assistance of another person to leave their place of residence. There is a normal inability to leave the home and doing so requires considerable and taxing effort. Other absences are for medical reasons / religious services and are infrequent or of short duration when for other reasons). If current dressing causes regression in wound  condition, may D/C ordered dressing product/s and apply Normal Saline Moist Dressing daily until next [place] / Other MD appointment. [place] of regression in wound condition at [phone]. Please direct any NON-WOUND related issues/requests for orders to patient's Primary Care Physician We have discussed using Santyl to the thoracic spine and the right and left hip. We will use Aquacel Ag on the right heel and beta iodine on the other dry eschars. Appropriate form padding has also been recommended.. Electronic Signature(s) Signed: 10/13/2015 4:28:42 PM By: Christin Fudge MD, FACS Previous Signature: 10/10/2015 4:47:41 PM Version By: Christin Fudge MD, FACS Entered By: Christin Fudge on 10/13/2015 [id number] Tindall, Brittiany H. ([id number]) -------------------------------------------------------------------------------- SuperBill Details Patient Name: [name], Breigh H. Date of Service: 10/10/2015 Medical Record Number: [id number] Patient Account Number: [id number] Date of Birth/Sex: March 06, 1946 (69 y.o. Female) Treating RN: Carolyne Fiscal, Debi Primary Care Physician: Paulita Cradle Other Clinician: Referring Physician: Paulita Cradle Treating Physician/Extender: Frann Rider in Treatment: 30 Diagnosis Coding ICD-10 Codes Code Description 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, stage 3 F17.218 Nicotine dependence, cigarettes, with other nicotine-induced disorders L89.100 Pressure ulcer of unspecified part of back, unstageable L89.213 Pressure ulcer of right hip, stage 3 Facility Procedures CPT4 Code: [id number] Description: [id number] - WOUND CARE VISIT-LEV 5 EST PT Modifier: Quantity: 1 Physician Procedures CPT4 Code: [id number] Description: [id number] - WC PHYS LEVEL 3 - EST PT ICD-10 Description Diagnosis 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, stage 3 L89.213 Pressure  ulcer of right hip, stage 3 L89.100 Pressure ulcer of unspecified  part of back, uns Modifier: tageable Quantity: 1 Electronic Signature(s) Signed: 10/13/2015 4:26:31 PM By: Christin Fudge MD, FACS Previous Signature: 10/10/2015 4:47:57 PM Version By: Christin Fudge MD, FACS Entered By: Alric Quan on 10/10/2015 17:17:16

## 2015-10-16 NOTE — Progress Notes (Signed)
[name], [name] ([id number]) Visit Report for 10/10/2015 Arrival Information Details Patient Name: [name], [name] Date of Service: 10/10/2015 3:30 PM Medical Record Number: [id number] Patient Account Number: [id number] Date of Birth/Sex: [birth year] (69 y.o. Female) Treating RN: Montey Hora Primary Care Physician: Paulita Cradle Other Clinician: Referring Physician: Paulita Cradle Treating Physician/Extender: Frann Rider in Treatment: 86 Visit Information History Since Last Visit All ordered tests and consults were completed: No Patient Arrived: Wheel Chair Added or deleted any medications: No Arrival Time: 16:16 Any new allergies or adverse reactions: No Accompanied By: husband Had a fall or experienced change in No activities of daily living that may affect Transfer Assistance: None risk of falls: Patient Identification Verified: Yes Signs or symptoms of abuse/neglect since last No Secondary Verification Process Yes visito Completed: Hospitalized since last visit: No Patient Requires Transmission-Based No Pain Present Now: No Precautions: Patient Has Alerts: No Electronic Signature(s) Signed: 10/10/2015 5:54:11 PM By: Montey Hora Entered By: Montey Hora on 10/10/2015 16:19:34 [name], [name] ([id number]) -------------------------------------------------------------------------------- Clinic Level of Care Assessment Details Patient Name: [name], [name] Date of Service: 10/10/2015 3:30 PM Medical Record Number: [id number] Patient Account Number: [id number] Date of Birth/Sex: 1946-02-02 (69 y.o. Female) Treating RN: Carolyne Fiscal, Debi Primary Care Physician: Paulita Cradle Other Clinician: Referring Physician: Paulita Cradle Treating Physician/Extender: Frann Rider in Treatment: 30 Clinic Level of Care Assessment Items TOOL 4 Quantity Score []  - Use when only an EandM is performed on FOLLOW-UP visit 0 ASSESSMENTS - Nursing Assessment /  Reassessment []  - Reassessment of Co-morbidities (includes updates in patient status) 0 X - Reassessment of Adherence to Treatment Plan 1 5 ASSESSMENTS - Wound and Skin Assessment / Reassessment []  - Simple Wound Assessment / Reassessment - one wound 0 X - Complex Wound Assessment / Reassessment - multiple wounds 6 5 []  - Dermatologic / Skin Assessment (not related to wound area) 0 ASSESSMENTS - Focused Assessment []  - Circumferential Edema Measurements - multi extremities 0 []  - Nutritional Assessment / Counseling / Intervention 0 []  - Lower Extremity Assessment (monofilament, tuning fork, pulses) 0 []  - Peripheral Arterial Disease Assessment (using hand held doppler) 0 ASSESSMENTS - Ostomy and/or Continence Assessment and Care []  - Incontinence Assessment and Management 0 []  - Ostomy Care Assessment and Management (repouching, etc.) 0 PROCESS - Coordination of Care []  - Simple Patient / Family Education for ongoing care 0 X - Complex (extensive) Patient / Family Education for ongoing care 1 20 []  - Staff obtains Programmer, systems, Records, Test Results / Process Orders 0 []  - Staff telephones HHA, Nursing Homes / Clarify orders / etc 0 []  - Routine Transfer to another Facility (non-emergent condition) 0 [name], [name] ([id number]) []  - Routine Hospital Admission (non-emergent condition) 0 []  - New Admissions / Biomedical engineer / Ordering NPWT, Apligraf, etc. 0 []  - Emergency Hospital Admission (emergent condition) 0 X - Simple Discharge Coordination 1 10 []  - Complex (extensive) Discharge Coordination 0 PROCESS - Special Needs []  - Pediatric / Minor Patient Management 0 []  - Isolation Patient Management 0 []  - Hearing / Language / Visual special needs 0 []  - Assessment of Community assistance (transportation, D/C planning, etc.) 0 []  - Additional assistance / Altered mentation 0 []  - Support Surface(s) Assessment (bed, cushion, seat, etc.) 0 INTERVENTIONS - Wound Cleansing /  Measurement []  - Simple Wound Cleansing - one wound 0 X - Complex Wound Cleansing - multiple wounds 6 5 X - Wound Imaging (photographs - any number of wounds) 1 5 []  -  Wound Tracing (instead of photographs) 0 []  - Simple Wound Measurement - one wound 0 X - Complex Wound Measurement - multiple wounds 6 5 INTERVENTIONS - Wound Dressings X - Small Wound Dressing one or multiple wounds 6 10 []  - Medium Wound Dressing one or multiple wounds 0 []  - Large Wound Dressing one or multiple wounds 0 X - Application of Medications - topical 1 5 []  - Application of Medications - injection 0 INTERVENTIONS - Miscellaneous []  - External ear exam 0 [name], [name] ([id number]) []  - Specimen Collection (cultures, biopsies, blood, body fluids, etc.) 0 []  - Specimen(s) / Culture(s) sent or taken to Lab for analysis 0 []  - Patient Transfer (multiple staff / [name] / Similar devices) 0 []  - Simple Staple / Suture removal (25 or less) 0 []  - Complex Staple / Suture removal (26 or more) 0 []  - Hypo / Hyperglycemic Management (close monitor of Blood Glucose) 0 []  - Ankle / Brachial Index (ABI) - do not check if billed separately 0 X - Vital Signs 1 5 Has the patient been seen at the hospital within the last three years: Yes Total Score: 200 Level Of Care: New/Established - Level 5 Electronic Signature(s) Signed: 10/15/2015 5:03:09 PM By: Alric Quan Entered By: Alric Quan on 10/10/2015 17:17:04 [name], [name] ([id number]) -------------------------------------------------------------------------------- Complex / Palliative Patient Assessment Details Patient Name: [name], [name] Date of Service: 10/10/2015 3:30 PM Medical Record Number: [id number] Patient Account Number: [id number] Date of Birth/Sex: 05-17-1946 (69 y.o. Female) Treating RN: Ahmed Prima Primary Care Physician: Paulita Cradle Other Clinician: Referring Physician: Paulita Cradle Treating Physician/Extender: Frann Rider in Treatment: 30 Palliative Management Criteria Complex Wound Management Criteria Patient is unable to adhere to an advanced wound management plan. o Patient Care Contract has been implemented. Patient demonstrates continued inability to adhere to an advanced wound management plan, the patient has been presented with the option of continuing the advanced plan or implementing the Complex Wound Management Plan. o Patient or healthcare surrogate must agree to Complex Wound Management Plan. Care Approach Wound Care Plan: Complex Wound Management Electronic Signature(s) Signed: 10/10/2015 4:48:22 PM By: Christin Fudge MD, FACS Signed: 10/15/2015 5:03:09 PM By: Alric Quan Entered By: Alric Quan on 10/10/2015 11:56:32 [name], [name] ([id number]) -------------------------------------------------------------------------------- Encounter Discharge Information Details Patient Name: [name], [name] Date of Service: 10/10/2015 3:30 PM Medical Record Number: [id number] Patient Account Number: [id number] Date of Birth/Sex: April 21, 1946 (69 y.o. Female) Treating RN: Carolyne Fiscal, Debi Primary Care Physician: Paulita Cradle Other Clinician: Referring Physician: Paulita Cradle Treating Physician/Extender: Frann Rider in Treatment: 30 Encounter Discharge Information Items Discharge Pain Level: 0 Discharge Condition: Stable Ambulatory Status: Wheelchair Discharge Destination: Home Transportation: Private Auto Accompanied By: husband Schedule Follow-up Appointment: Yes Medication Reconciliation completed and provided to Patient/Care Yes Jazelle Achey: Provided on Clinical Summary of Care: 10/10/2015 Form Type Recipient Paper Patient AS Electronic Signature(s) Signed: 10/10/2015 5:54:11 PM By: Montey Hora Previous Signature: 10/10/2015 4:49:42 PM Version By: Ruthine Dose Entered By: Montey Hora on 10/10/2015 17:00:36 Brisby, Berenize H.  ([id number]) -------------------------------------------------------------------------------- Lower Extremity Assessment Details Patient Name: Chestnutt, Naeemah H. Date of Service: 10/10/2015 3:30 PM Medical Record Number: [id number] Patient Account Number: [id number] Date of Birth/Sex: 06/06/1946 (69 y.o. Female) Treating RN: Montey Hora Primary Care Physician: Paulita Cradle Other Clinician: Referring Physician: Paulita Cradle Treating Physician/Extender: Frann Rider in Treatment: 30 Vascular Assessment Pulses: Posterior Tibial Dorsalis Pedis Palpable: [Left:Yes] [Right:Yes] Extremity colors, hair growth, and conditions: Extremity Color: [Left:Normal] [Right:Normal] Temperature of Extremity: [Left:Warm] [  Right:Warm] Capillary Refill: [Left:< 3 seconds] [Right:< 3 seconds] Toe Nail Assessment Left: Right: Thick: No No Discolored: No No Deformed: No No Improper Length and Hygiene: No No Electronic Signature(s) Signed: 10/10/2015 5:54:11 PM By: Montey Hora Entered By: Montey Hora on 10/10/2015 16:21:54 [name], [name] ([id number]) -------------------------------------------------------------------------------- Multi Wound Chart Details Patient Name: [name], Mariajose H. Date of Service: 10/10/2015 3:30 PM Medical Record Number: [id number] Patient Account Number: [id number] Date of Birth/Sex: 01-15-1946 (69 y.o. 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] [11:Pressure Ulcer] [3:Pressure Ulcer] Comorbid History: [10:Cataracts, Asthma, Hypertension, Type II Diabetes, Neuropathy, Received Chemotherapy] [11:Cataracts, Asthma, Hypertension, Type II Diabetes, Neuropathy, Received Chemotherapy] [3:Cataracts, Asthma, Hypertension, Type II Diabetes,  Neuropathy, Received Chemotherapy] Date Acquired: [10:09/12/2015] [11:09/29/2015] [3:05/02/2015] Weeks of Treatment: [10:3] [11:1] [3:23] Wound Status: [10:Open] [11:Open] [3:Open] Measurements L x W x D 0.4x0.4x0.1 [11:2.6x6.6x0.1] [3:2x3x0.1] (cm) Area (cm) : [10:0.126] [11:13.477] [3:4.712] Volume (cm) : [10:0.013] [11:1.348] [3:0.471] % Reduction in Area: [10:46.60%] [11:-1806.20%] [3:-2041.80%] % Reduction in Volume: 45.80% [11:-1798.60%] [3:-2040.90%] Classification: [10:Category/Stage II] [11:Category/Stage II] [3:Category/Stage II] HBO Classification: [10:Grade 1] [11:Grade 1] [3:N/A] Exudate Amount: [10:Medium] [11:Medium] [3:Medium] Exudate Type: [10:Serous] [11:Serous] [3:Serous] Exudate Color: [10:amber] [11:amber] [3:amber] Wound Margin: [10:Flat and Intact] [11:Flat and Intact] [3:Distinct, outline attached] Granulation Amount: [10:None Present (0%)] [11:Large (67-100%)] [3:Medium (34-66%)] Granulation Quality: [10:N/A] [11:Pink] [3:Red, Pink] Necrotic Amount: [10:Large (67-100%)] [11:Small (1-33%)] [3:Medium (34-66%)] Necrotic Tissue: [10:Eschar] [11:Eschar, Adherent Slough] [3:Eschar, Adherent Slough] Exposed Structures: [10:Fascia: No Fat: No Tendon: No] [11:Fascia: No Fat: No Tendon: No] [3:Fascia: No Fat: No Tendon: No] Muscle: No Muscle: No Muscle: No Joint: No Joint: No Joint: No Bone: No Bone: No Bone: No Limited to Skin Limited to Skin Limited to Skin Breakdown Breakdown Breakdown Epithelialization: None None None Periwound Skin Texture: No Abnormalities Noted Edema: No Edema:  Yes Excoriation: No Excoriation: No Induration: No Induration: No Callus: No Callus: No Crepitus: No Crepitus: No Fluctuance: No Fluctuance: No Friable: No Friable: No Rash: No Rash: No Scarring: No Scarring: No Periwound Skin No Abnormalities Noted Maceration: Yes Moist: Yes Moisture: Moist: Yes Maceration: No Dry/Scaly: No Dry/Scaly: No Periwound Skin Color: Erythema: Yes Atrophie Blanche: No Erythema: Yes Cyanosis: No Atrophie Blanche: No Ecchymosis: No Cyanosis: No Erythema: No Ecchymosis: No Hemosiderin Staining: No Hemosiderin Staining: No Mottled: No Mottled: No Pallor: No Pallor: No Rubor: No Rubor: No Erythema Location: Circumferential N/A Circumferential Temperature: No Abnormality N/A No Abnormality Tenderness on Yes No Yes Palpation: Wound Preparation: Ulcer Cleansing: Ulcer Cleansing: Ulcer Cleansing: Rinsed/Irrigated with Rinsed/Irrigated with Rinsed/Irrigated with Saline Saline Saline Topical Anesthetic Topical Anesthetic Topical Anesthetic Applied: None Applied: Other: lidocaine Applied: Other: lidocaine 4% 4% Wound Number: [id number]  Photos: No Photos No Photos No Photos Wound Location: Right Ischial Tuberosity Left Malleolus Left Ischial Tuberosity Wounding Event: Pressure Injury Gradually Appeared Gradually Appeared Primary Etiology: Pressure Ulcer Pressure Ulcer Pressure Ulcer Comorbid History: Cataracts, Asthma, Cataracts, Asthma, Cataracts, Asthma, Hypertension, Type II Hypertension, Type II Hypertension, Type II Diabetes, Neuropathy, Diabetes, Neuropathy, Diabetes, Neuropathy, Received Chemotherapy Received Chemotherapy Received Chemotherapy Date Acquired: 08/01/2015 09/12/2015 09/12/2015 Weeks of Treatment: 9 3 3  Wound Status:  Open Open Open [name], [name] ([id number]) Measurements L x W x D 0.3x1.2x0.1 1.8x1.1x0.1 1x1.7x0.1 (cm) Area (cm) : 0.283 1.555 1.335 Volume (cm) : 0.028 0.156 0.134 % Reduction in Area: 91.00%  -3208.50% -581.10% % Reduction in Volume: 91.10% -1633.30% -570.00% Classification: Category/Stage III Category/Stage II Category/Stage II HBO Classification: N/A Grade 0 N/A Exudate Amount: Medium Medium Medium Exudate Type: Serosanguineous Serous Serous Exudate Color: red, brown amber amber Wound Margin: Distinct, outline attached Flat and Intact Flat and Intact Granulation Amount: Small (1-33%) Small (1-33%) None Present (0%) Granulation Quality: Pink Red, Pink N/A Necrotic Amount: Large (67-100%) Large (67-100%) Large (67-100%) Necrotic Tissue: Adherent Slough Eschar Eschar, Adherent Slough Exposed Structures: Fascia: No Fascia: No Fascia: No Fat: No Fat: No Fat: No Tendon: No Tendon: No Tendon: No Muscle: No Muscle: No Muscle: No Joint: No Joint: No Joint: No Bone: No Bone: No Bone: No Limited to Skin Limited to Skin Limited to Skin Breakdown Breakdown Breakdown Epithelialization: None None None Periwound Skin Texture: Edema: Yes No Abnormalities Noted No Abnormalities Noted Excoriation: No Induration: No Callus: No Crepitus: No Fluctuance: No Friable: No Rash: No Scarring: No Periwound Skin Moist: Yes Moist: Yes Moist: Yes Moisture: Maceration: No Dry/Scaly: No Periwound Skin Color: Atrophie Blanche: No Erythema: Yes No Abnormalities Noted Cyanosis: No Ecchymosis: No Erythema: No Hemosiderin Staining: No Mottled: No Pallor: No Rubor: No Erythema Location: N/A Circumferential N/A Temperature: No Abnormality No Abnormality N/A Tenderness on No Yes No Palpation: Wound Preparation: [name], [name] ([id number]) Ulcer Cleansing: Ulcer Cleansing: Ulcer Cleansing: Rinsed/Irrigated with Rinsed/Irrigated with Rinsed/Irrigated with Saline Saline Saline Topical Anesthetic Topical Anesthetic Topical Anesthetic Applied: Other: lidocaine Applied: None Applied: Other: lidocaine 4% 4% Treatment Notes Electronic Signature(s) Signed: 10/10/2015 5:54:11 PM By:  Montey Hora Entered By: Montey Hora on 10/10/2015 16:33:43 Stober, Joycelin H. ([id number]) -------------------------------------------------------------------------------- Multi-Disciplinary Care Plan Details Patient Name: [name], Keishia H. Date of Service: 10/10/2015 3:30 PM Medical Record Number: [id number] Patient Account Number: [id number] Date of Birth/Sex: 01-11-1946 (69 y.o. Female) Treating RN: Montey Hora Primary Care Physician: Paulita Cradle Other Clinician: Referring Physician: Paulita Cradle Treating Physician/Extender: Frann Rider in Treatment: 30 Active Inactive Abuse / Safety / Falls / Self Care Management Nursing Diagnoses: Impaired home maintenance Impaired physical mobility Knowledge deficit related to: safety; personal, health (wound), emergency Potential for falls Self care deficit: actual or potential Goals: Patient will remain injury free Date Initiated: 03/13/2015 Goal Status: Active Patient/caregiver will verbalize understanding of skin care regimen Date Initiated: 03/13/2015 Goal Status: Active Patient/caregiver will verbalize/demonstrate measure taken to improve self care Date Initiated: 03/13/2015 Goal Status: Active Patient/caregiver will verbalize/demonstrate measures taken to improve the patient's personal safety Date Initiated: 03/13/2015 Goal Status: Active Patient/caregiver will verbalize/demonstrate measures taken to prevent injury and/or falls Date Initiated: 03/13/2015 Goal Status: Active Patient/caregiver will verbalize/demonstrate understanding of what to do in case of emergency Date Initiated: 03/13/2015 Goal Status: Active Interventions: Assess fall risk on admission and as needed Assess: immobility, friction, shearing, incontinence upon admission and as needed Assess impairment of mobility on admission and as needed per policy Assess self care needs on admission and as needed Provide education on basic  hygiene Saiki, [name] ([id number]) Provide education on fall prevention Provide education on personal and home safety Provide education on safe transfers Treatment Activities: Education provided on Basic Hygiene : 03/13/2015 Notes: Orientation to the Wound Care Program Nursing Diagnoses: Knowledge deficit related to the wound healing center program Goals: Patient/caregiver will verbalize understanding of the [facility] Date  Initiated: 03/13/2015 Goal Status: Active Interventions: Provide education on orientation to the wound center Notes: Pressure Nursing Diagnoses: Knowledge deficit related to causes and risk factors for pressure ulcer development Knowledge deficit related to management of pressures ulcers Potential for impaired tissue integrity related to pressure, friction, moisture, and shear Goals: Patient will remain free from development of additional pressure ulcers Date Initiated: 03/13/2015 Goal Status: Active Patient will remain free of pressure ulcers Date Initiated: 03/13/2015 Goal Status: Active Patient/caregiver will verbalize risk factors for pressure ulcer development Date Initiated: 03/13/2015 Goal Status: Active Patient/caregiver will verbalize understanding of pressure ulcer management Date Initiated: 03/13/2015 Goal Status: Active Interventions: Assess: immobility, friction, shearing, incontinence upon admission and as needed Pfeifle, Tempie H. ([id number]) Assess offloading mechanisms upon admission and as needed Assess potential for pressure ulcer upon admission and as needed Provide education on pressure ulcers Treatment Activities: Patient referred for home evaluation of offloading devices/mattresses : 10/10/2015 Patient referred for pressure reduction/relief devices : 10/10/2015 Patient referred for seating evaluation to ensure proper offloading : 10/10/2015 Pressure reduction/relief device ordered : 10/10/2015 Test ordered outside of  clinic : 10/10/2015 Notes: Wound/Skin Impairment Nursing Diagnoses: Impaired tissue integrity Knowledge deficit related to ulceration/compromised skin integrity Goals: Patient/caregiver will verbalize understanding of skin care regimen Date Initiated: 03/13/2015 Goal Status: Active Ulcer/skin breakdown will have a volume reduction of 30% by week 4 Date Initiated: 03/13/2015 Goal Status: Active Ulcer/skin breakdown will have a volume reduction of 50% by week 8 Date Initiated: 03/13/2015 Goal Status: Active Ulcer/skin breakdown will have a volume reduction of 80% by week 12 Date Initiated: 03/13/2015 Goal Status: Active Ulcer/skin breakdown will heal within 14 weeks Date Initiated: 03/13/2015 Goal Status: Active Interventions: Assess patient/caregiver ability to obtain necessary supplies Assess patient/caregiver ability to perform ulcer/skin care regimen upon admission and as needed Assess ulceration(s) every visit Provide education on smoking Provide education on ulcer and skin care Treatment Activities: Patient referred to home care : 10/10/2015 FORRISTER, Tyrone H. ([id number]) Referred to DME [facility] for dressing supplies : 10/10/2015 Skin care regimen initiated : 10/10/2015 Topical wound management initiated : 10/10/2015 Notes: Electronic Signature(s) Signed: 10/10/2015 5:54:11 PM By: Montey Hora Entered By: Montey Hora on 10/10/2015 16:33:36 [name], [name] ([id number]) -------------------------------------------------------------------------------- Pain Assessment Details Patient Name: [name], Avia H. Date of Service: 10/10/2015 3:30 PM Medical Record Number: [id number] Patient Account Number: [id number] Date of Birth/Sex: 1946/05/23 (69 y.o. Female) Treating RN: Montey Hora Primary Care Physician: Paulita Cradle Other Clinician: Referring Physician: Paulita Cradle Treating Physician/Extender: Frann Rider in Treatment: 30 Active Problems Location of Pain  Severity and Description of Pain Patient Has Paino No Site Locations Pain Management and Medication Current Pain Management: Electronic Signature(s) Signed: 10/10/2015 5:54:11 PM By: Montey Hora Entered By: Montey Hora on 10/10/2015 16:19:39 Lopez, Yula H. ([id number]) -------------------------------------------------------------------------------- Patient/Caregiver Education Details Patient Name: [name], Amery H. Date of Service: 10/10/2015 3:30 PM Medical Record Number: [id number] Patient Account Number: [id number] Date of Birth/Gender: Dec 04, 1945 (70 y.o. Female) Treating RN: Montey Hora Primary Care Physician: Paulita Cradle Other Clinician: Referring Physician: Paulita Cradle Treating Physician/Extender: Frann Rider in Treatment: 30 Education Assessment Education Provided To: Patient and Caregiver Education Topics Provided Wound/Skin Impairment: Handouts: Other: change dressing as directed Methods: Demonstration, Explain/Verbal Responses: State content correctly Electronic Signature(s) Signed: 10/10/2015 5:54:11 PM By: Montey Hora Entered By: Montey Hora on 10/10/2015 17:01:00 Forry, Jae H. ([id number]) -------------------------------------------------------------------------------- Wound Assessment Details Patient Name: Lamica, Lavaun H. Date of Service: 10/10/2015 3:30 PM Medical Record Number: [id number] Patient Account Number: [id number] Date  of Birth/Sex: 06/10/1946 (69 y.o. Female) Treating RN: Montey Hora Primary Care Physician: Paulita Cradle Other Clinician: Referring Physician: Paulita Cradle Treating Physician/Extender: Frann Rider in Treatment: 30 Wound Status Wound Number: 10 Primary Pressure Ulcer Etiology: Wound Location: Right Foot - Medial Wound Open Wounding Event: Gradually Appeared Status: Date Acquired: 09/12/2015 Comorbid Cataracts, Asthma, Hypertension, Type Weeks Of Treatment: 3 History: II  Diabetes, Neuropathy, Received Clustered Wound: No Chemotherapy Photos Photo Uploaded By: Montey Hora on 10/10/2015 17:44:07 Wound Measurements Length: (cm) 0.4 Width: (cm) 0.4 Depth: (cm) 0.1 Area: (cm) 0.126 Volume: (cm) 0.013 % Reduction in Area: 46.6% % Reduction in Volume: 45.8% Epithelialization: None Tunneling: No Undermining: No Wound Description Classification: Category/Stage II Foul Odor A Diabetic Severity (Wagner): Grade 1 Wound Margin: Flat and Intact Exudate Amount: Medium Exudate Type: Serous Exudate Color: amber fter Cleansing: No Wound Bed Granulation Amount: None Present (0%) Exposed Structure Necrotic Amount: Large (67-100%) Fascia Exposed: No Necrotic Quality: Eschar Fat Layer Exposed: No [place], Basia H. ([id number]) Tendon Exposed: No Muscle Exposed: No Joint Exposed: No Bone Exposed: No Limited to Skin Breakdown Periwound Skin Texture Texture Color No Abnormalities Noted: No No Abnormalities Noted: No Erythema: Yes Moisture Erythema Location: Circumferential No Abnormalities Noted: No Temperature / Pain Temperature: No Abnormality Tenderness on Palpation: Yes Wound Preparation Ulcer Cleansing: Rinsed/Irrigated with Saline Topical Anesthetic Applied: None Treatment Notes Wound #10 (Right, Medial Foot) 1. Cleansed with: Clean wound with Normal Saline 3. Peri-wound Care: Skin Prep 4. Dressing Applied: Other dressing (specify in notes) 5. Secondary Dressing Applied Bordered Foam Dressing Notes betadine paint Electronic Signature(s) Signed: 10/10/2015 5:54:11 PM By: Montey Hora Entered By: Montey Hora on 10/10/2015 16:23:39 Coad, Lakyn H. ([id number]) -------------------------------------------------------------------------------- Wound Assessment Details Patient Name: Poland, Delonna H. Date of Service: 10/10/2015 3:30 PM Medical Record Number: [id number] Patient Account Number: [id number] Date of Birth/Sex: [birth year] (69  y.o. Female) Treating RN: Montey Hora Primary Care Physician: Paulita Cradle Other Clinician: Referring Physician: Paulita Cradle Treating Physician/Extender: Frann Rider in Treatment: 30 Wound Status Wound Number: 11 Primary Pressure Ulcer Etiology: Wound Location: Right Calcaneus Wound Open Wounding Event: Pressure Injury Status: Date Acquired: 09/29/2015 Comorbid Cataracts, Asthma, Hypertension, Type Weeks Of Treatment: 1 History: II Diabetes, Neuropathy, Received Clustered Wound: No Chemotherapy Photos Photo Uploaded By: Montey Hora on 10/10/2015 17:44:07 Wound Measurements Length: (cm) 2.6 Width: (cm) 6.6 Depth: (cm) 0.1 Area: (cm) 13.477 Volume: (cm) 1.348 % Reduction in Area: -1806.2% % Reduction in Volume: -1798.6% Epithelialization: None Tunneling: No Undermining: No Wound Description Classification: Category/Stage II Diabetic Severity Earleen Newport): Grade 1 Wound Margin: Flat and Intact Exudate Amount: Medium Exudate Type: Serous Exudate Color: amber Foul Odor After Cleansing: No Wound Bed Granulation Amount: Large (67-100%) Exposed Structure Granulation Quality: Pink Fascia Exposed: No Necrotic Amount: Small (1-33%) Fat Layer Exposed: No Hojnacki, Leanne H. ([id number]) Necrotic Quality: Eschar, Adherent Slough Tendon Exposed: No Muscle Exposed: No Joint Exposed: No Bone Exposed: No Limited to Skin Breakdown Periwound Skin Texture Texture Color No Abnormalities Noted: No No Abnormalities Noted: No Callus: No Atrophie Blanche: No Crepitus: No Cyanosis: No Excoriation: No Ecchymosis: No Fluctuance: No Erythema: No Friable: No Hemosiderin Staining: No Induration: No Mottled: No Localized Edema: No Pallor: No Rash: No Rubor: No Scarring: No Moisture No Abnormalities Noted: No Dry / Scaly: No Maceration: Yes Moist: Yes Wound Preparation Ulcer Cleansing: Rinsed/Irrigated with Saline Topical Anesthetic  Applied: Other: lidocaine 4%, Treatment Notes Wound #11 (Right Calcaneus) 1. Cleansed with: Clean wound with Normal Saline 2. Anesthetic Topical Lidocaine 4% cream  to wound bed prior to debridement 4. Dressing Applied: Aquacel Ag 5. Secondary Dressing Applied Bordered Foam Dressing Electronic Signature(s) Signed: 10/10/2015 5:54:11 PM By: Montey Hora Entered By: Montey Hora on 10/10/2015 16:24:58 Mormile, Angelika H. ([id number]) -------------------------------------------------------------------------------- Wound Assessment Details Patient Name: Brethauer, Gunda H. Date of Service: 10/10/2015 3:30 PM Medical Record Number: [id number] Patient Account Number: [id number] Date of Birth/Sex: 01/21/1946 (69 y.o. Female) Treating RN: Montey Hora Primary Care Physician: Paulita Cradle Other Clinician: Referring Physician: Paulita Cradle Treating Physician/Extender: Frann Rider in Treatment: 30 Wound Status Wound Number: 3 Primary Pressure Ulcer Etiology: Wound Location: Back - Midline Wound Open Wounding Event: Pressure Injury Status: Date Acquired: 05/02/2015 Comorbid Cataracts, Asthma, Hypertension, Type Weeks Of Treatment: 23 History: II Diabetes, Neuropathy, Received Clustered Wound: No Chemotherapy Photos Photo Uploaded By: Montey Hora on 10/10/2015 17:44:42 Wound Measurements Length: (cm) 2 Width: (cm) 3 Depth: (cm) 0.1 Area: (cm) 4.712 Volume: (cm) 0.471 % Reduction in Area: -2041.8% % Reduction in Volume: -2040.9% Epithelialization: None Tunneling: No Undermining: No Wound Description Classification: Category/Stage II Wound Margin: Distinct, outline attached Exudate Amount: Medium Exudate Type: Serous Exudate Color: amber Foul Odor After Cleansing: No Wound Bed Granulation Amount: Medium (34-66%) Exposed Structure Granulation Quality: Red, Pink Fascia Exposed: No Necrotic Amount: Medium (34-66%) Fat Layer Exposed: No Necrotic  Quality: Eschar, Adherent Slough Tendon Exposed: No Colwell, Myrah H. ([id number]) Muscle Exposed: No Joint Exposed: No Bone Exposed: No Limited to Skin Breakdown Periwound Skin Texture Texture Color No Abnormalities Noted: No No Abnormalities Noted: No Callus: No Atrophie Blanche: No Crepitus: No Cyanosis: No Excoriation: No Ecchymosis: No Fluctuance: No Erythema: Yes Friable: No Erythema Location: Circumferential Induration: No Hemosiderin Staining: No Localized Edema: Yes Mottled: No Rash: No Pallor: No Scarring: No Rubor: No Moisture Temperature / Pain No Abnormalities Noted: No Temperature: No Abnormality Dry / Scaly: No Tenderness on Palpation: Yes Maceration: No Moist: Yes Wound Preparation Ulcer Cleansing: Rinsed/Irrigated with Saline Topical Anesthetic Applied: Other: lidocaine 4%, Treatment Notes Wound #3 (Midline Back) 1. Cleansed with: Clean wound with Normal Saline 2. Anesthetic Topical Lidocaine 4% cream to wound bed prior to debridement 4. Dressing Applied: Santyl Ointment Other dressing (specify in notes) 5. Secondary Dressing Applied Bordered Foam Dressing Notes Nystatin cream around wound Electronic Signature(s) Signed: 10/10/2015 5:54:11 PM By: Montey Hora Entered By: Montey Hora on 10/10/2015 16:32:50 Duerst, Brittiny H. ([id number]) -------------------------------------------------------------------------------- Wound Assessment Details Patient Name: Hinze, Andrell H. Date of Service: 10/10/2015 3:30 PM Medical Record Number: [id number] Patient Account Number: [id number] Date of Birth/Sex: [birth year] (69 y.o. 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 Wound Open Wounding  Event: Pressure Injury Status: Date Acquired: 08/01/2015 Comorbid Cataracts, Asthma, Hypertension, Type Weeks Of Treatment: 9 History: II Diabetes, Neuropathy, Received Clustered Wound: No Chemotherapy Photos Photo Uploaded By: Montey Hora on 10/10/2015 17:43:04 Wound Measurements Length: (cm) 0.3 Width: (cm) 1.2 Depth: (cm) 0.1 Area: (cm) 0.283 Volume: (cm) 0.028 % Reduction in Area: 91% % Reduction in Volume: 91.1% Epithelialization: None Tunneling: No Undermining: No Wound Description Classification: Category/Stage III Wound Margin: Distinct, outline attached Exudate Amount: Medium Exudate Type: Serosanguineous Exudate Color: red, brown Foul Odor After Cleansing: No Wound Bed Granulation Amount: Small (1-33%) Exposed Structure Granulation Quality: Pink Fascia Exposed: No Necrotic Amount: Large (67-100%) Fat Layer Exposed: No Necrotic Quality: Adherent Slough Tendon Exposed: No Moehring, Khiana H. (  [id number]) Muscle Exposed: No Joint Exposed: No Bone Exposed: No Limited to Skin Breakdown Periwound Skin Texture Texture Color No Abnormalities Noted: No No Abnormalities Noted: No Callus: No Atrophie Blanche: No Crepitus: No Cyanosis: No Excoriation: No Ecchymosis: No Fluctuance: No Erythema: No Friable: No Hemosiderin Staining: No Induration: No Mottled: No Localized Edema: Yes Pallor: No Rash: No Rubor: No Scarring: No Temperature / Pain Moisture Temperature: No Abnormality No Abnormalities Noted: No Dry / Scaly: No Maceration: No Moist: Yes Wound Preparation Ulcer Cleansing: Rinsed/Irrigated with Saline Topical Anesthetic Applied: Other: lidocaine 4%, Treatment Notes Wound #7 (Right Ischial Tuberosity) 1. Cleansed with: Clean wound with Normal Saline 2. Anesthetic Topical Lidocaine 4% cream to wound bed prior to debridement 4. Dressing Applied: Santyl Ointment 5. Secondary Dressing Applied Bordered Foam Dressing Electronic  Signature(s) Signed: 10/10/2015 5:54:11 PM By: Montey Hora Entered By: Montey Hora on 10/10/2015 16:29:53 Demauro, Kamaljit H. ([id number]) -------------------------------------------------------------------------------- Wound Assessment Details Patient Name: Rahal, Catina H. Date of Service: 10/10/2015 3:30 PM Medical Record Number: [id number] Patient Account Number: [id number] Date of Birth/Sex: 08/19/1946 (69 y.o. Female) Treating RN: Montey Hora Primary Care Physician: Paulita Cradle Other Clinician: Referring Physician: Paulita Cradle Treating Physician/Extender: Frann Rider in Treatment: 30 Wound Status Wound Number: 8 Primary Pressure Ulcer Etiology: Wound Location: Left Malleolus Wound Open Wounding Event: Gradually Appeared Status: Date Acquired: 09/12/2015 Comorbid Cataracts, Asthma, Hypertension, Type Weeks Of Treatment: 3 History: II Diabetes, Neuropathy, Received Clustered Wound: No Chemotherapy Photos Photo Uploaded By: Montey Hora on 10/10/2015 17:43:05 Wound Measurements Length: (cm) 1.8 Width: (cm) 1.1 Depth: (cm) 0.1 Area: (cm) 1.555 Volume: (cm) 0.156 % Reduction in Area: -3208.5% % Reduction in Volume: -1633.3% Epithelialization: None Tunneling: No Undermining: No Wound Description Classification: Category/Stage II Foul Odor A Diabetic Severity (Wagner): Grade 0 Wound Margin: Flat and Intact Exudate Amount: Medium Exudate Type: Serous Exudate Color: amber fter Cleansing: No Wound Bed Granulation Amount: Small (1-33%) Exposed Structure Granulation Quality: Red, Pink Fascia Exposed: No Necrotic Amount: Large (67-100%) Fat Layer Exposed: No Spanos, Kacee H. ([id number]) Necrotic Quality: Eschar Tendon Exposed: No Muscle Exposed: No Joint Exposed: No Bone Exposed: No Limited to Skin Breakdown Periwound Skin Texture Texture Color No Abnormalities Noted: No No Abnormalities Noted: No Erythema: Yes Moisture Erythema  Location: Circumferential No Abnormalities Noted: No Moist: Yes Temperature / Pain Temperature: No Abnormality Tenderness on Palpation: Yes Wound Preparation Ulcer Cleansing: Rinsed/Irrigated with Saline Topical Anesthetic Applied: None Treatment Notes Wound #8 (Left Malleolus) 1. Cleansed with: Clean wound with Normal Saline 2. Anesthetic Topical Lidocaine 4% cream to wound bed prior to debridement 5. Secondary Dressing Applied Bordered Foam Dressing Notes betadine paint Electronic Signature(s) Signed: 10/10/2015 5:54:11 PM By: Montey Hora Entered By: Montey Hora on 10/10/2015 16:25:45 Crean, Shermeka H. ([id number]) -------------------------------------------------------------------------------- Wound Assessment Details Patient Name: Fini, Brooklyne H. Date of Service: 10/10/2015 3:30 PM Medical Record Number: [id number] Patient Account Number: [id number] Date of Birth/Sex: [birth year] (69 y.o. Female) Treating RN: Montey Hora Primary Care Physician: Paulita Cradle Other Clinician: Referring Physician: Paulita Cradle Treating Physician/Extender: Frann Rider in Treatment: 30 Wound Status Wound Number: 9 Primary Pressure Ulcer Etiology: Wound Location: Left Ischial Tuberosity Wound Open Wounding Event: Gradually Appeared Status: Date Acquired: 09/12/2015 Comorbid Cataracts, Asthma, Hypertension, Type Weeks Of Treatment: 3 History: II Diabetes, Neuropathy, Received Clustered Wound: No Chemotherapy Photos Photo Uploaded By: Montey Hora on 10/10/2015 17:42:06 Wound Measurements Length: (cm) 1 Width: (cm) 1.7 Depth: (cm) 0.1 Area: (cm) 1.335 Volume: (cm) 0.134 % Reduction in Area: -581.1% % Reduction in  Volume: -570% Epithelialization: None Tunneling: No Undermining: No Wound Description Classification: Category/Stage II Wound Margin: Flat and Intact Exudate Amount: Medium Exudate Type: Serous Exudate Color: amber Foul Odor After  Cleansing: No Wound Bed Granulation Amount: None Present (0%) Exposed Structure Necrotic Amount: Large (67-100%) Fascia Exposed: No Necrotic Quality: Eschar, Adherent Slough Fat Layer Exposed: No Tendon Exposed: No Parlier, Zacari H. ([id number]) Muscle Exposed: No Joint Exposed: No Bone Exposed: No Limited to Skin Breakdown Periwound Skin Texture Texture Color No Abnormalities Noted: No No Abnormalities Noted: No Moisture No Abnormalities Noted: No Moist: Yes Wound Preparation Ulcer Cleansing: Rinsed/Irrigated with Saline Topical Anesthetic Applied: Other: lidocaine 4%, Treatment Notes Wound #9 (Left Ischial Tuberosity) 1. Cleansed with: Clean wound with Normal Saline 2. Anesthetic Topical Lidocaine 4% cream to wound bed prior to debridement 4. Dressing Applied: Santyl Ointment 5. Secondary Dressing Applied Bordered Foam Dressing Notes betadine paint Electronic Signature(s) Signed: 10/10/2015 5:54:11 PM By: Montey Hora Entered By: Montey Hora on 10/10/2015 16:26:53 Bolla, Adylynn H. ([id number]) -------------------------------------------------------------------------------- Vitals Details Patient Name: [name], Tywana H. Date of Service: 10/10/2015 3:30 PM Medical Record Number: [id number] Patient Account Number: [id number] Date of Birth/Sex: February 18, 1946 (69 y.o. Female) Treating RN: Montey Hora Primary Care Physician: Paulita Cradle Other Clinician: Referring Physician: Paulita Cradle Treating Physician/Extender: Frann Rider in Treatment: 30 Vital Signs Time Taken: 16:19 Temperature (F): 98.1 Height (in): 65 Pulse (bpm): 108 Weight (lbs): 100 Respiratory Rate (breaths/min): 20 Body Mass Index (BMI): 16.6 Blood Pressure (mmHg): 132/77 Reference Range: 80 - 120 mg / dl Electronic Signature(s) Signed: 10/10/2015 5:54:11 PM By: Montey Hora Entered By: Montey Hora on 10/10/2015 16:21:24

## 2015-10-17 ENCOUNTER — Encounter: Payer: Medicare Other | Attending: Surgery | Admitting: Surgery

## 2015-10-17 DIAGNOSIS — L89613 Pressure ulcer of right heel, stage 3: Secondary | ICD-10-CM | POA: Diagnosis not present

## 2015-10-17 DIAGNOSIS — L89213 Pressure ulcer of right hip, stage 3: Secondary | ICD-10-CM | POA: Diagnosis not present

## 2015-10-17 DIAGNOSIS — I10 Essential (primary) hypertension: Secondary | ICD-10-CM | POA: Diagnosis not present

## 2015-10-17 DIAGNOSIS — Z853 Personal history of malignant neoplasm of breast: Secondary | ICD-10-CM | POA: Insufficient documentation

## 2015-10-17 DIAGNOSIS — L89100 Pressure ulcer of unspecified part of back, unstageable: Secondary | ICD-10-CM | POA: Insufficient documentation

## 2015-10-17 DIAGNOSIS — F17218 Nicotine dependence, cigarettes, with other nicotine-induced disorders: Secondary | ICD-10-CM | POA: Diagnosis not present

## 2015-10-17 DIAGNOSIS — F1021 Alcohol dependence, in remission: Secondary | ICD-10-CM | POA: Diagnosis not present

## 2015-10-17 DIAGNOSIS — M810 Age-related osteoporosis without current pathological fracture: Secondary | ICD-10-CM | POA: Diagnosis not present

## 2015-10-17 DIAGNOSIS — E11621 Type 2 diabetes mellitus with foot ulcer: Secondary | ICD-10-CM | POA: Diagnosis not present

## 2015-10-17 DIAGNOSIS — E1140 Type 2 diabetes mellitus with diabetic neuropathy, unspecified: Secondary | ICD-10-CM | POA: Diagnosis not present

## 2015-10-19 NOTE — Progress Notes (Signed)
[name], [name] ([id number]) Visit Report for 10/17/2015 Arrival Information Details Patient Name: [name], [name] Date of Service: 10/17/2015 3:30 PM Medical Record Number: [id number] Patient Account Number: [id number] Date of Birth/Sex: [birth year] (69 y.o. Female) Treating RN: Montey Hora Primary Care Physician: Paulita Cradle Other Clinician: Referring Physician: Paulita Cradle Treating Physician/Extender: Frann Rider in Treatment: 30 Visit Information History Since Last Visit Added or deleted any medications: No Patient Arrived: Walker Any new allergies or adverse reactions: No Arrival Time: 15:36 Had a fall or experienced change in No Accompanied By: spouse activities of daily living that may affect Transfer Assistance: None risk of falls: Patient Identification Verified: Yes Signs or symptoms of abuse/neglect since last No Secondary Verification Process Completed: Yes visito Patient Requires Transmission-Based No Hospitalized since last visit: No Precautions: Pain Present Now: No Patient Has Alerts: No Electronic Signature(s) Signed: 10/17/2015 5:19:21 PM By: Montey Hora Entered By: Montey Hora on 10/17/2015 15:39:09 [name], [name] ([id number]) -------------------------------------------------------------------------------- Encounter Discharge Information Details Patient Name: [name], [name] Date of Service: 10/17/2015 3:30 PM Medical Record Number: [id number] Patient Account Number: [id number] Date of Birth/Sex: [birth year] (69 y.o. Female) Treating RN: Montey Hora Primary Care Physician: Paulita Cradle Other Clinician: Referring Physician: Paulita Cradle Treating Physician/Extender: Frann Rider in Treatment: 31 Encounter Discharge Information Items Schedule Follow-up Appointment: No Medication Reconciliation completed No and provided to Patient/Care [name]: Provided on Clinical Summary of Care: 10/17/2015 Form Type  Recipient Paper Patient AS Electronic Signature(s) Signed: 10/17/2015 4:30:24 PM By: Ruthine Dose Entered By: Ruthine Dose on 10/17/2015 16:30:24 [name], [name] ([id number]) -------------------------------------------------------------------------------- Multi Wound Chart Details Patient Name: [name], [name] Date of Service: 10/17/2015 3:30 PM Medical Record Number: [id number] Patient Account Number: [id number] Date of Birth/Sex: [birth year] (69 y.o. 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] [11:Pressure Ulcer] [3:Pressure Ulcer] Comorbid History: [10:Cataracts, Asthma, Hypertension, Type II Diabetes, Neuropathy, Received Chemotherapy] [11:Cataracts, Asthma, Hypertension, Type II Diabetes, Neuropathy, Received Chemotherapy] [3:N/A] Date Acquired: [10:09/12/2015] [11:09/29/2015] [3:05/02/2015] Weeks of Treatment: [10:4] [11:2] [3:24] Wound Status: [10:Open] [11:Open] [3:Open] Measurements L x W x D 0.2x0.2x0.1 [11:2.6x3.2x0.1] [3:2.2x2.3x0.1] (cm) Area (cm) : [10:0.031] [11:6.535] [3:3.974] Volume (cm) : [10:0.003] [11:0.653] [3:0.397] % Reduction in Area: [10:86.90%] [11:-824.30%] [3:-1706.40%] % Reduction in Volume: 87.50% [11:-819.70%] [3:-1704.50%] Classification: [10:Category/Stage II] [11:Category/Stage II] [3:Category/Stage II] HBO Classification: [10:Grade 1] [11:Grade 1] [3:N/A] Exudate Amount:  [10:Medium] [11:Medium] [3:N/A] Exudate Type: [10:Serous] [11:Serous] [3:N/A] Exudate Color: [10:amber] [11:amber] [3:N/A] Wound Margin: [10:Flat and Intact] [11:Flat and Intact] [3:N/A] Granulation Amount: [10:None Present (0%)] [11:Large (67-100%)] [3:N/A] Granulation Quality: [10:N/A] [11:Pink] [3:N/A] Necrotic Amount: [10:Large (67-100%)] [11:Small (1-33%)] [3:N/A] Necrotic Tissue: [10:Eschar] [11:Eschar, Adherent Slough] [3:N/A] Exposed Structures: [10:Fascia: No Fat: No Tendon: No] [11:Fascia: No Fat: No Tendon: No] [3:N/A] Muscle: No Muscle: No Joint: No Joint: No Bone: No Bone: No Limited to Skin Limited to Skin Breakdown Breakdown Epithelialization: None None N/A Periwound Skin Texture: No Abnormalities Noted Edema: No No Abnormalities Noted Excoriation: No Induration: No Callus: No Crepitus: No Fluctuance: No Friable: No Rash: No Scarring: No Periwound Skin No Abnormalities Noted Maceration: Yes No Abnormalities Noted Moisture: Moist: Yes Dry/Scaly: No Periwound  Skin Color: Erythema: Yes Atrophie Blanche: No No Abnormalities Noted Cyanosis: No Ecchymosis: No Erythema: No Hemosiderin Staining: No Mottled: No Pallor: No Rubor: No Erythema Location: Circumferential N/A N/A Temperature: No Abnormality N/A N/A Tenderness on Yes No No Palpation: Wound Preparation: Ulcer Cleansing: Ulcer Cleansing: N/A Rinsed/Irrigated with Rinsed/Irrigated with Saline Saline Topical Anesthetic Topical Anesthetic Applied: None Applied: Other: lidocaine 4% Wound Number: [id number]  Photos: No Photos No Photos No Photos Wound Location: Right Ischial Tuberosity Left Malleolus Left Ischial Tuberosity Wounding Event: Pressure Injury Gradually Appeared Gradually Appeared Primary Etiology: Pressure Ulcer Pressure Ulcer Pressure Ulcer Comorbid History: Cataracts, Asthma, Cataracts, Asthma, Cataracts, Asthma, Hypertension, Type II Hypertension, Type II Hypertension, Type  II Diabetes, Neuropathy, Diabetes, Neuropathy, Diabetes, Neuropathy, Received Chemotherapy Received Chemotherapy Received Chemotherapy Date Acquired: 08/01/2015 09/12/2015 09/12/2015 Weeks of Treatment: 10 4 4  Wound Status: Open Open Open [name], [name] ([id number]) Measurements L x W x D 0.2x0.9x0.1 1.5x1x0.2 1x1.4x0.1 (cm) Area (cm) : 0.141 1.178 1.1 Volume (cm) : 0.014 0.236 0.11 % Reduction in Area: 95.50% -2406.40% -461.20% % Reduction in Volume: 95.50% -2522.20% -450.00% Classification: Category/Stage III Category/Stage II Category/Stage II HBO Classification: N/A Grade 0 N/A Exudate Amount: Medium Medium Medium Exudate Type: Serosanguineous Serous Serous Exudate Color: red, brown amber amber Wound Margin: Distinct, outline attached Flat and Intact Flat and Intact Granulation Amount: Small (1-33%) Small (1-33%) None Present (0%) Granulation Quality: Pink Red, Pink N/A Necrotic Amount: Large (67-100%) Large (67-100%) Large (67-100%) Necrotic Tissue: Adherent Slough Eschar Eschar, Adherent Slough Exposed Structures: Fascia: No Fascia: No Fascia: No Fat: No Fat: No Fat: No Tendon: No Tendon: No Tendon: No Muscle: No Muscle: No Muscle: No Joint: No Joint: No Joint: No Bone: No Bone: No Bone: No Limited to Skin Limited to Skin Limited to Skin Breakdown Breakdown Breakdown Epithelialization: None None None Periwound Skin Texture: Edema: Yes No Abnormalities Noted No Abnormalities Noted Excoriation: No Induration: No Callus: No Crepitus: No Fluctuance: No Friable: No Rash: No Scarring: No Periwound Skin Moist: Yes Moist: Yes Moist: Yes Moisture: Maceration: No Dry/Scaly: No Periwound Skin Color: Atrophie Blanche: No Erythema: Yes No Abnormalities Noted Cyanosis: No Ecchymosis: No Erythema: No Hemosiderin Staining: No Mottled: No Pallor: No Rubor: No Erythema Location: N/A Circumferential N/A Temperature: No Abnormality No Abnormality  N/A Tenderness on No Yes No Palpation: Wound Preparation: [name], [name] ([id number]) Ulcer Cleansing: Ulcer Cleansing: Ulcer Cleansing: Rinsed/Irrigated with Rinsed/Irrigated with Rinsed/Irrigated with Saline Saline Saline Topical Anesthetic Topical Anesthetic Topical Anesthetic Applied: Other: lidocaine Applied: None Applied: Other: lidocaine 4% 4% Treatment Notes Electronic Signature(s) Signed: 10/17/2015 5:19:21 PM By: Montey Hora Entered By: Montey Hora on 10/17/2015 16:12:27 [name], [name] ([id number]) -------------------------------------------------------------------------------- Multi-Disciplinary Care Plan Details Patient Name: [name], Aiva [name]. Date of Service: 10/17/2015 3:30 PM Medical Record Number: [id number] Patient Account Number: [id number] Date of Birth/Sex: Aug 04, 1946 (69 y.o. Female) Treating RN: Montey Hora Primary Care Physician: Paulita Cradle Other Clinician: Referring Physician: Paulita Cradle Treating Physician/Extender: Frann Rider in Treatment: 69 Active Inactive Abuse / Safety / Falls / Self Care Management Nursing Diagnoses: Impaired home maintenance Impaired physical mobility Knowledge deficit related to: safety; personal, health (wound), emergency Potential for falls Self care deficit: actual or potential Goals: Patient will remain injury free Date Initiated: 03/13/2015 Goal Status: Active Patient/caregiver will verbalize understanding of skin care regimen Date Initiated: 03/13/2015 Goal Status: Active Patient/caregiver will verbalize/demonstrate measure taken to improve self care Date Initiated: 03/13/2015 Goal Status: Active Patient/caregiver will verbalize/demonstrate measures taken to improve the patient's personal safety Date  Initiated: 03/13/2015 Goal Status: Active Patient/caregiver will verbalize/demonstrate measures taken to prevent injury and/or falls Date Initiated: 03/13/2015 Goal Status:  Active Patient/caregiver will verbalize/demonstrate understanding of what to do in case of emergency Date Initiated: 03/13/2015 Goal Status: Active Interventions: Assess fall risk on admission and as needed Assess: immobility, friction, shearing, incontinence upon admission and as needed Assess impairment of mobility on admission and as needed per policy Assess self care needs on admission and as needed Provide education on basic hygiene Stevens, Nikolette [name]. ([id number]) Provide education on fall prevention Provide education on personal and home safety Provide education on safe transfers Treatment Activities: Education provided on Basic Hygiene : 03/13/2015 Notes: Orientation to the Wound Care Program Nursing Diagnoses: Knowledge deficit related to the wound healing center program Goals: Patient/caregiver will verbalize understanding of the [place] Date Initiated: 03/13/2015 Goal Status: Active Interventions: Provide education on orientation to the wound center Notes: Pressure Nursing Diagnoses: Knowledge deficit related to causes and risk factors for pressure ulcer development Knowledge deficit related to management of pressures ulcers Potential for impaired tissue integrity related to pressure, friction, moisture, and shear Goals: Patient will remain free from development of additional pressure ulcers Date Initiated: 03/13/2015 Goal Status: Active Patient will remain free of pressure ulcers Date Initiated: 03/13/2015 Goal Status: Active Patient/caregiver will verbalize risk factors for pressure ulcer development Date Initiated: 03/13/2015 Goal Status: Active Patient/caregiver will verbalize understanding of pressure ulcer management Date Initiated: 03/13/2015 Goal Status: Active Interventions: Assess: immobility, friction, shearing, incontinence upon admission and as needed [name], [name] ([id number]) Assess offloading mechanisms upon admission and as  needed Assess potential for pressure ulcer upon admission and as needed Provide education on pressure ulcers Treatment Activities: Patient referred for home evaluation of offloading devices/mattresses : 10/17/2015 Patient referred for pressure reduction/relief devices : 10/17/2015 Patient referred for seating evaluation to ensure proper offloading : 10/17/2015 Pressure reduction/relief device ordered : 10/17/2015 Test ordered outside of clinic : 10/17/2015 Notes: Wound/Skin Impairment Nursing Diagnoses: Impaired tissue integrity Knowledge deficit related to ulceration/compromised skin integrity Goals: Patient/caregiver will verbalize understanding of skin care regimen Date Initiated: 03/13/2015 Goal Status: Active Ulcer/skin breakdown will have a volume reduction of 30% by week 4 Date Initiated: 03/13/2015 Goal Status: Active Ulcer/skin breakdown will have a volume reduction of 50% by week 8 Date Initiated: 03/13/2015 Goal Status: Active Ulcer/skin breakdown will have a volume reduction of 80% by week 12 Date Initiated: 03/13/2015 Goal Status: Active Ulcer/skin breakdown will heal within 14 weeks Date Initiated: 03/13/2015 Goal Status: Active Interventions: Assess patient/caregiver ability to obtain necessary supplies Assess patient/caregiver ability to perform ulcer/skin care regimen upon admission and as needed Assess ulceration(s) every visit Provide education on smoking Provide education on ulcer and skin care Treatment Activities: Patient referred to home care : 10/17/2015 [name], [name] ([id number]) Referred to DME Junita Kubota for dressing supplies : 10/17/2015 Skin care regimen initiated : 10/17/2015 Topical wound management initiated : 10/17/2015 Notes: Electronic Signature(s) Signed: 10/17/2015 5:19:21 PM By: Montey Hora Entered By: Montey Hora on 10/17/2015 16:08:53 [name], [place]. ([id number]) -------------------------------------------------------------------------------- Wound  Assessment Details Patient Name: Thoen, Yoshi [name]. Date of Service: 10/17/2015 3:30 PM Medical Record Number: [id number] Patient Account Number: [id number] Date of Birth/Sex: [birth year] (69 y.o. Female) Treating RN: Montey Hora Primary Care Physician: Paulita Cradle Other Clinician: Referring Physician: Paulita Cradle Treating Physician/Extender: Frann Rider in Treatment: 31 Wound Status Wound Number: 10 Primary Pressure Ulcer Etiology: Wound Location: Right Foot - Medial Wound Open Wounding Event: Gradually Appeared Status:  Date Acquired: 09/12/2015 Comorbid Cataracts, Asthma, Hypertension, Type Weeks Of Treatment: 4 History: II Diabetes, Neuropathy, Received Clustered Wound: No Chemotherapy Photos Photo Uploaded By: Montey Hora on 10/17/2015 17:16:38 Wound Measurements Length: (cm) 0.2 Width: (cm) 0.2 Depth: (cm) 0.1 Area: (cm) 0.031 Volume: (cm) 0.003 % Reduction in Area: 86.9% % Reduction in Volume: 87.5% Epithelialization: None Tunneling: No Undermining: No Wound Description Classification: Category/Stage II Foul Odor A Diabetic Severity (Wagner): Grade 1 Wound Margin: Flat and Intact Exudate Amount: Medium Exudate Type: Serous Exudate Color: amber fter Cleansing: No Wound Bed Granulation Amount: None Present (0%) Exposed Structure Necrotic Amount: Large (67-100%) Fascia Exposed: No Necrotic Quality: Eschar Fat Layer Exposed: No Cataldo, [name] ([id number]) Tendon Exposed: No Muscle Exposed: No Joint Exposed: No Bone Exposed: No Limited to Skin Breakdown Periwound Skin Texture Texture Color No Abnormalities Noted: No No Abnormalities Noted: No Erythema: Yes Moisture Erythema Location: Circumferential No Abnormalities Noted: No Temperature / Pain Temperature: No Abnormality Tenderness on Palpation: Yes Wound Preparation Ulcer Cleansing: Rinsed/Irrigated with Saline Topical Anesthetic Applied: None Electronic  Signature(s) Signed: 10/17/2015 5:19:21 PM By: Montey Hora Entered By: Montey Hora on 10/17/2015 16:04:47 [name], [name] ([id number]) -------------------------------------------------------------------------------- Wound Assessment Details Patient Name: Welles, Acquanetta [name]. Date of Service: 10/17/2015 3:30 PM Medical Record Number: [id number] Patient Account Number: [id number] Date of Birth/Sex: Jun 05, 1946 (69 y.o. Female) Treating RN: Montey Hora Primary Care Physician: Paulita Cradle Other Clinician: Referring Physician: Paulita Cradle Treating Physician/Extender: Frann Rider in Treatment: 31 Wound Status Wound Number: 11 Primary Pressure Ulcer Etiology: Wound Location: Right Calcaneus Wound Open Wounding Event: Pressure Injury Status: Date Acquired: 09/29/2015 Comorbid Cataracts, Asthma, Hypertension, Type Weeks Of Treatment: 2 History: II Diabetes, Neuropathy, Received Clustered Wound: No Chemotherapy Photos Photo Uploaded By: Montey Hora on 10/17/2015 17:17:08 Wound Measurements Length: (cm) 2.6 Width: (cm) 3.2 Depth: (cm) 0.1 Area: (cm) 6.535 Volume: (cm) 0.653 % Reduction in Area: -824.3% % Reduction in Volume: -819.7% Epithelialization: None Tunneling: No Undermining: No Wound Description Classification: Category/Stage II Diabetic Severity Earleen Newport): Grade 1 Wound Margin: Flat and Intact Exudate Amount: Medium Exudate Type: Serous Exudate Color: amber Foul Odor After Cleansing: No Wound Bed Granulation Amount: Large (67-100%) Exposed Structure Granulation Quality: Pink Fascia Exposed: No Necrotic Amount: Small (1-33%) Fat Layer Exposed: No Beaubrun, Honest [name]. ([id number]) Necrotic Quality: Eschar, Adherent Slough Tendon Exposed: No Muscle Exposed: No Joint Exposed: No Bone Exposed: No Limited to Skin Breakdown Periwound Skin Texture Texture Color No Abnormalities Noted: No No Abnormalities Noted: No Callus: No Atrophie Blanche:  No Crepitus: No Cyanosis: No Excoriation: No Ecchymosis: No Fluctuance: No Erythema: No Friable: No Hemosiderin Staining: No Induration: No Mottled: No Localized Edema: No Pallor: No Rash: No Rubor: No Scarring: No Moisture No Abnormalities Noted: No Dry / Scaly: No Maceration: Yes Moist: Yes Wound Preparation Ulcer Cleansing: Rinsed/Irrigated with Saline Topical Anesthetic Applied: Other: lidocaine 4%, Electronic Signature(s) Signed: 10/17/2015 5:19:21 PM By: Montey Hora Entered By: Montey Hora on 10/17/2015 16:07:09 Gilman, Ema [name]. ([id number]) -------------------------------------------------------------------------------- Wound Assessment Details Patient Name: Boehringer, Niomi [name]. Date of Service: 10/17/2015 3:30 PM Medical Record Number: [id number] Patient Account Number: [id number] Date of Birth/Sex: 04-Oct-1945 (69 y.o. Female) Treating RN: Montey Hora Primary Care Physician: Paulita Cradle Other Clinician: Referring Physician: Paulita Cradle Treating Physician/Extender: Frann Rider in Treatment: 31 Wound Status Wound Number: 3 Primary Etiology: Pressure Ulcer Wound Location: Midline Back Wound Status: Open Wounding Event: Pressure Injury Date Acquired: 05/02/2015 Weeks Of Treatment: 24 Clustered Wound: No Photos Photo Uploaded By: Montey Hora on  10/17/2015 17:17:08 Wound Measurements Length: (cm) 2.2 Width: (cm) 2.3 Depth: (cm) 0.1 Area: (cm) 3.974 Volume: (cm) 0.397 % Reduction in Area: -1706.4% % Reduction in Volume: -1704.5% Wound Description Classification: Category/Stage II Periwound Skin Texture Texture Color No Abnormalities Noted: No No Abnormalities Noted: No Moisture No Abnormalities Noted: No Electronic Signature(s) Signed: 10/17/2015 5:19:21 PM By: Penny Pia, Nekeisha [name]. ([id number]) Entered By: Montey Hora on 10/17/2015 15:57:06 Felter, Shaketha [name].  ([id number]) -------------------------------------------------------------------------------- Wound Assessment Details Patient Name: Meegan, Trinitey [name]. Date of Service: 10/17/2015 3:30 PM Medical Record Number: [id number] Patient Account Number: [id number] Date of Birth/Sex: 1946/03/22 (69 y.o. Female) Treating RN: Montey Hora Primary Care Physician: Paulita Cradle Other Clinician: Referring Physician: Paulita Cradle Treating Physician/Extender: Frann Rider in Treatment: 31 Wound Status Wound Number: 7 Primary Pressure Ulcer Etiology: Wound Location: Right Ischial Tuberosity Wound Open Wounding Event: Pressure Injury Status: Date Acquired: 08/01/2015 Comorbid Cataracts, Asthma, Hypertension, Type Weeks Of Treatment: 10 History: II Diabetes, Neuropathy, Received Clustered Wound: No Chemotherapy Photos Photo Uploaded By: Montey Hora on 10/17/2015 17:17:46 Wound Measurements Length: (cm) 0.2 Width: (cm) 0.9 Depth: (cm) 0.1 Area: (cm) 0.141 Volume: (cm) 0.014 % Reduction in Area: 95.5% % Reduction in Volume: 95.5% Epithelialization: None Tunneling: No Undermining: No Wound Description Classification: Category/Stage III Wound Margin: Distinct, outline attached Exudate Amount: Medium Exudate Type: Serosanguineous Exudate Color: red, brown Foul Odor After Cleansing: No Wound Bed Granulation Amount: Small (1-33%) Exposed Structure Granulation Quality: Pink Fascia Exposed: No Necrotic Amount: Large (67-100%) Fat Layer Exposed: No Necrotic Quality: Adherent Slough Tendon Exposed: No Ayub, Wyllow [name]. ([id number]) Muscle Exposed: No Joint Exposed: No Bone Exposed: No Limited to Skin Breakdown Periwound Skin Texture Texture Color No Abnormalities Noted: No No Abnormalities Noted: No Callus: No Atrophie Blanche: No Crepitus: No Cyanosis: No Excoriation: No Ecchymosis: No Fluctuance: No Erythema: No Friable: No Hemosiderin Staining:  No Induration: No Mottled: No Localized Edema: Yes Pallor: No Rash: No Rubor: No Scarring: No Temperature / Pain Moisture Temperature: No Abnormality No Abnormalities Noted: No Dry / Scaly: No Maceration: No Moist: Yes Wound Preparation Ulcer Cleansing: Rinsed/Irrigated with Saline Topical Anesthetic Applied: Other: lidocaine 4%, Electronic Signature(s) Signed: 10/17/2015 5:19:21 PM By: Montey Hora Entered By: Montey Hora on 10/17/2015 16:07:47 Parsley, Sally-Anne [name]. ([id number]) -------------------------------------------------------------------------------- Wound Assessment Details Patient Name: Stinger, Vanesa [name]. Date of Service: 10/17/2015 3:30 PM Medical Record Number: [id number] Patient Account Number: [id number] Date of Birth/Sex: June 17, 1946 (69 y.o. Female) Treating RN: Montey Hora Primary Care Physician: Paulita Cradle Other Clinician: Referring Physician: Paulita Cradle Treating Physician/Extender: Frann Rider in Treatment: 31 Wound Status Wound Number: 8 Primary Pressure Ulcer Etiology: Wound Location: Left Malleolus Wound Open Wounding Event: Gradually Appeared Status: Date Acquired: 09/12/2015 Comorbid Cataracts, Asthma, Hypertension, Type Weeks Of Treatment: 4 History: II Diabetes, Neuropathy, Received Clustered Wound: No Chemotherapy Photos Photo Uploaded By: Montey Hora on 10/17/2015 17:17:46 Wound Measurements Length: (cm) 1.5 Width: (cm) 1 Depth: (cm) 0.2 Area: (cm) 1.178 Volume: (cm) 0.236 % Reduction in Area: -2406.4% % Reduction in Volume: -2522.2% Epithelialization: None Tunneling: No Undermining: No Wound Description Classification: Category/Stage II Foul Odor A Diabetic Severity (Wagner): Grade 0 Wound Margin: Flat and Intact Exudate Amount: Medium Exudate Type: Serous Exudate Color: amber fter Cleansing: No Wound Bed Granulation Amount: Small (1-33%) Exposed Structure Granulation Quality: Red, Pink Fascia  Exposed: No Necrotic Amount: Large (67-100%) Fat Layer Exposed: No Casalino, Aaryn [name]. ([id number]) Necrotic Quality: Eschar Tendon Exposed: No Muscle Exposed: No Joint Exposed: No Bone Exposed: No Limited to Skin  Breakdown Periwound Skin Texture Texture Color No Abnormalities Noted: No No Abnormalities Noted: No Erythema: Yes Moisture Erythema Location: Circumferential No Abnormalities Noted: No Moist: Yes Temperature / Pain Temperature: No Abnormality Tenderness on Palpation: Yes Wound Preparation Ulcer Cleansing: Rinsed/Irrigated with Saline Topical Anesthetic Applied: None Electronic Signature(s) Signed: 10/17/2015 5:19:21 PM By: Montey Hora Entered By: Montey Hora on 10/17/2015 16:08:05 Rittenberry, Lariza [name]. ([id number]) -------------------------------------------------------------------------------- Wound Assessment Details Patient Name: Lightle, Kaitlen [name]. Date of Service: 10/17/2015 3:30 PM Medical Record Number: [id number] Patient Account Number: [id number] Date of Birth/Sex: [birth year] (69 y.o. Female) Treating RN: Montey Hora Primary Care Physician: Paulita Cradle Other Clinician: Referring Physician: Paulita Cradle Treating Physician/Extender: Frann Rider in Treatment: 31 Wound Status Wound Number: 9 Primary Pressure Ulcer Etiology: Wound Location: Left Ischial Tuberosity Wound Open Wounding Event: Gradually Appeared Status: Date Acquired: 09/12/2015 Comorbid Cataracts, Asthma, Hypertension, Type Weeks Of Treatment: 4 History: II Diabetes, Neuropathy, Received Clustered Wound: No Chemotherapy Photos Photo Uploaded By: Montey Hora on 10/17/2015 17:18:02 Wound Measurements Length: (cm) 1 Width: (cm) 1.4 Depth: (cm) 0.1 Area: (cm) 1.1 Volume: (cm) 0.11 % Reduction in Area: -461.2% % Reduction in Volume: -450% Epithelialization: None Tunneling: No Undermining: No Wound Description Classification: Category/Stage II Wound Margin:  Flat and Intact Exudate Amount: Medium Exudate Type: Serous Exudate Color: amber Foul Odor After Cleansing: No Wound Bed Granulation Amount: None Present (0%) Exposed Structure Necrotic Amount: Large (67-100%) Fascia Exposed: No Necrotic Quality: Eschar, Adherent Slough Fat Layer Exposed: No Tendon Exposed: No Obie, Britnie [name]. ([id number]) Muscle Exposed: No Joint Exposed: No Bone Exposed: No Limited to Skin Breakdown Periwound Skin Texture Texture Color No Abnormalities Noted: No No Abnormalities Noted: No Moisture No Abnormalities Noted: No Moist: Yes Wound Preparation Ulcer Cleansing: Rinsed/Irrigated with Saline Topical Anesthetic Applied: Other: lidocaine 4%, Electronic Signature(s) Signed: 10/17/2015 5:19:21 PM By: Montey Hora Entered By: Montey Hora on 10/17/2015 16:08:25 Peron, Lela [name]. ([id number]) -------------------------------------------------------------------------------- Vitals Details Patient Name: [name], [place] [name]. Date of Service: 10/17/2015 3:30 PM Medical Record Number: [id number] Patient Account Number: [id number] Date of Birth/Sex: 1946-02-04 (69 y.o. Female) Treating RN: Montey Hora Primary Care Physician: Paulita Cradle Other Clinician: Referring Physician: Paulita Cradle Treating Physician/Extender: Frann Rider in Treatment: 31 Vital Signs Time Taken: 15:39 Temperature (F): 98.3 Height (in): 65 Pulse (bpm): 108 Weight (lbs): 100 Respiratory Rate (breaths/min): 18 Body Mass Index (BMI): 16.6 Blood Pressure (mmHg): 128/83 Reference Range: 80 - 120 mg / dl Electronic Signature(s) Signed: 10/17/2015 5:19:21 PM By: Montey Hora Entered By: Montey Hora on 10/17/2015 15:39:35

## 2015-10-20 ENCOUNTER — Emergency Department: Payer: Medicare Other

## 2015-10-20 ENCOUNTER — Emergency Department
Admission: EM | Admit: 2015-10-20 | Discharge: 2015-10-20 | Disposition: A | Discharge status: Home / Self Care | Payer: Medicare Other | Attending: Student | Admitting: Student

## 2015-10-20 ENCOUNTER — Encounter: Payer: Self-pay | Admitting: Emergency Medicine

## 2015-10-20 DIAGNOSIS — R4182 Altered mental status, unspecified: Secondary | ICD-10-CM | POA: Diagnosis present

## 2015-10-20 DIAGNOSIS — F17200 Nicotine dependence, unspecified, uncomplicated: Secondary | ICD-10-CM | POA: Diagnosis not present

## 2015-10-20 DIAGNOSIS — E119 Type 2 diabetes mellitus without complications: Secondary | ICD-10-CM | POA: Insufficient documentation

## 2015-10-20 DIAGNOSIS — R531 Weakness: Secondary | ICD-10-CM | POA: Diagnosis not present

## 2015-10-20 DIAGNOSIS — I10 Essential (primary) hypertension: Secondary | ICD-10-CM | POA: Diagnosis not present

## 2015-10-20 NOTE — ED Notes (Signed)
Called several times from lobby, outside and restroom with no answer

## 2015-10-20 NOTE — ED Notes (Addendum)
Patient was lethargic earlier today per family. Patient states her head was drooping, but she could hear them talking to her. Was also told by her PCP that she has UTI currently. Has been hospitalized twice since December for infection (PNA) and anemia. Patient alert now. Family states they got her up at 8am this am, took until 10:30 to get her out of bed. States patient did not feel right upon waking.

## 2015-10-20 NOTE — ED Notes (Signed)
CT reports called pt several times from lobby with no answer

## 2015-10-21 NOTE — Progress Notes (Addendum)
[name], [name] ([id number]) Visit Report for 10/17/2015 Chief Complaint Document Details Patient Name: [name], [name] Date of Service: 10/17/2015 3:30 PM Medical Record Number: [id number] Patient Account Number: [id number] Date of Birth/Sex: 1945-10-03 (69 y.o. Female) Treating RN: Montey Hora Primary Care Physician: Paulita Cradle Other Clinician: Referring Physician: Paulita Cradle Treating Physician/Extender: Frann Rider in Treatment: 31 Information Obtained from: Patient Chief Complaint Patient presents to the wound care center for a consult due non healing wound. Ulcers on the right elbow and the right heel for about 1 month. Electronic Signature(s) Signed: 10/17/2015 4:23:50 PM By: Christin Fudge MD, FACS Entered By: Christin Fudge on 10/17/2015 16:23:50 Steffensmeier, Marionette H. ([id number]) -------------------------------------------------------------------------------- Debridement Details Patient Name: [name], [name] Date of Service: 10/17/2015 3:30 PM Medical Record Number: [id number] Patient Account Number: [id number] Date of Birth/Sex: 1946-03-22 (69 y.o. Female) Treating RN: Montey Hora Primary Care Physician: Paulita Cradle Other Clinician: Referring Physician: Paulita Cradle Treating Physician/Extender: Frann Rider in Treatment: 31 Debridement Performed for Wound #8 Left Malleolus Assessment: Performed By: Physician Christin Fudge, MD Debridement: Debridement Pre-procedure Yes Verification/Time Out Taken: Start Time: 16:08 Pain Control: Lidocaine 4% Topical Solution Level: Skin/Subcutaneous Tissue Total Area Debrided (L x 1.5 (cm) x 1 (cm) = 1.5 (cm) W): Tissue and other Viable, Non-Viable, Eschar, Fibrin/Slough, Subcutaneous material debrided: Instrument: Forceps, Scissors Bleeding: Minimum Hemostasis Achieved: Pressure End Time: 16:10 Procedural Pain: 0 Post Procedural Pain: 0 Response to Treatment: Procedure was tolerated  well Post Debridement Measurements of Total Wound Length: (cm) 1.5 Stage: Category/Stage II Width: (cm) 1 Depth: (cm) 0.2 Volume: (cm) 0.236 Post Procedure Diagnosis Same as Pre-procedure Electronic Signature(s) Signed: 10/17/2015 4:23:39 PM By: Christin Fudge MD, FACS Signed: 10/17/2015 5:19:21 PM By: Montey Hora Previous Signature: 10/17/2015 4:23:22 PM Version By: Christin Fudge MD, FACS Entered By: Christin Fudge on 10/17/2015 16:23:39 Schumpert, Jozette H. ([id number]) -------------------------------------------------------------------------------- HPI Details Patient Name: [name], [name] Date of Service: 10/17/2015 3:30 PM Medical Record Number: [id number] Patient Account Number: [id number] Date of Birth/Sex: 01/29/1946 (69 y.o. Female) Treating RN: Montey Hora Primary Care Physician: Paulita Cradle Other Clinician: Referring Physician: Paulita Cradle Treating Physician/Extender: Frann Rider in Treatment: 31 History of Present Illness Location: Ulceration on the right heel and the right elbow. Quality: Patient reports experiencing a dull pain to affected area(s). Severity: Patient states wound (s) are getting better. Duration: Patient has had the wound for < 4 weeks prior to presenting for treatment Timing: Pain in wound is Intermittent (comes and goes Context: The wound appeared gradually over time Modifying Factors: Consults to this date include:Augmentin and Bactrim and also some heel protection with duoderm Associated Signs and Symptoms: Patient reports having difficulty standing for long periods. HPI Description: 70 year old female with history of peripheral neuropathy, history of diet controlled diabetes mellitus type 2, history of alcoholism here for wound consult sent by her PCP Dr. Sherilyn Cooter. She has pressure ulcers at her right elbow, bilateral heels. Plain films of right calcaneus without acute bony process. Patient started by PCP on Augmentin, Bactrim as per  orders, DuoDerm dressings applied - reports some improvement in her ulcer since last seen. Denies fever, chills, nausea, vomiting, diarrhea. She had a right humerus fracture in the middle of May and has had no surgery and arm is in a sling. She is also been laying in the bed for quite a while. Past medical history significant for essential hypertension, osteoporosis, peripheral neuropathy, alcoholism, ataxia, personal history of breast cancer treated with surgery chemotherapy and radiation and this was  done in December 2010. she is also status post laparoscopic cholecystectomy, pilonidal cyst excision, subcutaneous port placement, partial mastectomy on the left side, skin cancer removal. 03/21/2015 -- she says overall she's been doing better and continues to smoke about 15 cigarettes a day. 03/21/2015 - her orthopedic doctor has said she may require surgery for her right humerus fracture. 04/04/2015 -- her orthopedic surgery has been scheduled for August 11. 04/18/2015 -- she is doing fine as far as her elbow and her right heel goes but she has developed some redness over prominence on her thoracic spine and wanted me to take a look at this. 05/02/2015 -- she had her surgery done and now is in a sling and support. Her back has developed a pressure injury of undetermined stage. She seems to be in better spirits. 05/16/2015 -- last week her right heel was looking great and we had healed it out, but she has not been offloading appropriately and has a deep tissue injury on the right heel again. The area on her right elbow has opened out with slough and the area in the thoracic spine is also getting worse. 05/30/2015 -- she has developed 2 new ulcerations one on her left ischial tuberosity and one on the sacral region. She is still working on getting up smoking but is also unable to take her vitamins as she says she develops a diarrhea when she takes vitamins. She has increased her intake of  proteins. 06/06/2015 -- the patient's husband manages to get her a low air loss mattress with initiating pressure but Mckenzie, Khaila H. ([id number]) did not know how to use it exactly and the patient was not happy about using it. Overall she says she's been feeling better. 07/11/2015 -- . Discussed a surgical opinion for debridement and application of a wound VAC, 2 weeks ago but the patient has been reluctant to get a surgical opinion as she wants to avoid surgery. 07/18/2015 -- they have an appointment to see Dr. Tamala Julian this coming Wednesday. 07/29/2015 -- they saw Dr. Tamala Julian in his office and he did a debridement of the wound and removed significant amount of slough. This is in addition to the debridement I had done previously on Friday where a large amount of the eschar was removed. He did not recommend the application of wound VAC. Addendum : Dr. Thompson Caul note was reviewed via EPIC and details noted as above. 08/05/2015 -- over the last week she has developed a another pressure injury to her right hip and has had significant discoloration of the skin and an eschar there. 08/15/2015 -- she is still smoking about a pack of cigarettes a day and does not seem to want to quit. They have not been able to talk to the vendor regarding the air mattress and I will ask them to get in touch again. She is reluctant to take vitamins and does admit her nutrition is poor. 08/22/2015 -- she has been unable to tolerate her vitamins and continues to smoke. They're working on getting a low air loss mattress and have been speaking with the vendor. 09/19/2015 -- since her last visit and was admitted to the hospital between 09/09/2015 and 09/16/2015. She was thought to have an active sepsis possibly from one of her decubitus ulcers but nothing was grown except for an MSSA from her thoracic spine region. CT of this area did not show any osteomyelitis. Been given IV antibiotics which included vancomycin and Zosyn in  the hospital under the care of  Dr. [name] the ID specialist she was sent home on oral Keflex 500 mg 3 times a day for 2 weeks. he will consider an MRI in the outpatient setting to completely rule out osteomyelitis of the spine. 10/03/2015 --readmitted to hospital on 09/23/2015 for general debility, lethargy and possible sepsis and workup was in progress. Seen by Dr. [name] and he would also like a workup for underlying malignancy as sheos had previous ultrasounds of the breasts suggesting findings of concern. Workup done so far does not suggest deep bony infection. She was treated for a pneumonia and received Zosyn and vancomycin. She had been recommended Cipro 500 twice a day and doxycycline 100 mg twice a day once she was to be [place] home. The antibiotics were to be stopped on 10/07/2015. 10/17/2015 -- patient is feeling much better and has been eating better and doing her offloading as much as possible. Electronic Signature(s) Signed: 10/17/2015 4:24:38 PM By: Christin Fudge MD, FACS Entered By: Christin Fudge on 10/17/2015 16:24:38 Cassedy, Kathlynn H. ([id number]) -------------------------------------------------------------------------------- Physical Exam Details Patient Name: Gaona, Jersi H. Date of Service: 10/17/2015 3:30 PM Medical Record Number: [id number] Patient Account Number: [id number] Date of Birth/Sex: 05-Oct-1945 (69 y.o. Female) Treating RN: Montey Hora Primary Care Physician: Paulita Cradle Other Clinician: Referring Physician: Paulita Cradle Treating Physician/Extender: Frann Rider in Treatment: 31 Constitutional . Pulse regular. Respirations normal and unlabored. Afebrile. . Eyes Nonicteric. Reactive to light. Ears, Nose, Mouth, and Throat Lips, teeth, and gums WNL.Marland Kitchen Moist mucosa without lesions. Neck supple and nontender. No palpable supraclavicular or cervical adenopathy. Normal sized without goiter. Respiratory WNL. No retractions.. Breath  sounds WNL, No rubs, rales, rhonchi, or wheeze.. Cardiovascular Heart rhythm and rate regular, no murmur or gallop.. Pedal Pulses WNL. No clubbing, cyanosis or edema. Chest Breasts symmetical and no nipple discharge.. Breast tissue WNL, no masses, lumps, or tenderness.. Lymphatic No adneopathy. No adenopathy. No adenopathy. Musculoskeletal Adexa without tenderness or enlargement.. Digits and nails w/o clubbing, cyanosis, infection, petechiae, ischemia, or inflammatory conditions.. Integumentary (Hair, Skin) No suspicious lesions. No crepitus or fluctuance. No peri-wound warmth or erythema. No masses.Marland Kitchen Psychiatric Judgement and insight Intact.. No evidence of depression, anxiety, or agitation.. Notes the left lateral malleolus eschar and subcutaneous tissue were sharply debrided with a forcep and scissors and there was debris going down to the connective tissue. Minimal bleeding controlled with pressure The rest of the wounds have less slough and more granulation tissue and we will continue to use Santyl on these. Electronic Signature(s) Signed: 10/17/2015 4:25:23 PM By: Christin Fudge MD, FACS Entered By: Christin Fudge on 10/17/2015 16:25:23 Housand, Laurene HMarland Kitchen ([id number]) -------------------------------------------------------------------------------- Physician Orders Details Patient Name: [name], Soliana H. Date of Service: 10/17/2015 3:30 PM Medical Record Number: [id number] Patient Account Number: [id number] Date of Birth/Sex: [birth year] (69 y.o. Female) Treating RN: Montey Hora Primary Care Physician: Paulita Cradle Other Clinician: Referring Physician: Paulita Cradle Treating Physician/Extender: Frann Rider in Treatment: 7 Verbal / Phone Orders: Yes Clinician: Montey Hora Read Back and Verified: Yes Diagnosis Coding ICD-10 Coding Code Description 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, stage 3 F17.218 Nicotine  dependence, cigarettes, with other nicotine-induced disorders L89.100 Pressure ulcer of unspecified part of back, unstageable L89.213 Pressure ulcer of right hip, stage 3 Wound Cleansing Wound #10 Right,Medial Foot o Clean wound with Normal Saline. Wound #11 Right Calcaneus o Clean wound with Normal Saline. Wound #3 Midline Back o Clean wound with Normal Saline. Wound #7 Right Ischial Tuberosity o  Clean wound with Normal Saline. Wound #8 Left Malleolus o Clean wound with Normal Saline. Wound #9 Left Ischial Tuberosity o Clean wound with Normal Saline. Anesthetic Wound #10 Right,Medial Foot o Topical Lidocaine 4% cream applied to wound bed prior to debridement Wound #11 Right Calcaneus o Topical Lidocaine 4% cream applied to wound bed prior to debridement Wound #3 Midline Back o Topical Lidocaine 4% cream applied to wound bed prior to debridement Patricelli, Paije H. ([id number]) Wound #7 Right Ischial Tuberosity o Topical Lidocaine 4% cream applied to wound bed prior to debridement Wound #8 Left Malleolus o Topical Lidocaine 4% cream applied to wound bed prior to debridement Wound #9 Left Ischial Tuberosity o Topical Lidocaine 4% cream applied to wound bed prior to debridement Skin Barriers/Peri-Wound Care Wound #10 Right,Medial Foot o Skin Prep Wound #11 Right Calcaneus o Skin Prep Wound #3 Midline Back o Skin Prep Wound #7 Right Ischial Tuberosity o Skin Prep Wound #8 Left Malleolus o Skin Prep Wound #9 Left Ischial Tuberosity o Skin Prep Primary Wound Dressing Wound #10 Right,Medial Foot o Other: - betadine paint Wound #11 Right Calcaneus o Aquacel Ag Wound #8 Left Malleolus o Aquacel Ag Wound #3 Midline Back o Santyl Ointment o Other: - Lotrisome around the wound Wound #7 Right Ischial Tuberosity o Santyl Ointment Wound #9 Left Ischial Tuberosity o Santyl Ointment Kia, Thomasine H. ([id number]) Secondary  Dressing Wound #10 Right,Medial Foot o Boardered Foam Dressing Wound #11 Right Calcaneus o Boardered Foam Dressing Wound #3 Midline Back o Boardered Foam Dressing Wound #7 Right Ischial Tuberosity o Boardered Foam Dressing Wound #8 Left Malleolus o Boardered Foam Dressing Wound #9 Left Ischial Tuberosity o Boardered Foam Dressing Dressing Change Frequency Wound #10 Right,Medial Foot o Change dressing every day. Wound #11 Right Calcaneus o Change dressing every day. Wound #3 Midline Back o Change dressing every day. Wound #7 Right Ischial Tuberosity o Change dressing every day. Wound #8 Left Malleolus o Change dressing every day. Wound #9 Left Ischial Tuberosity o Change dressing every day. Follow-up Appointments Wound #10 Right,Medial Foot o Return Appointment in 1 week. Wound #11 Right Calcaneus o Return Appointment in 1 week. Wound #3 Midline Back o Return Appointment in 1 week. Happ, Shakima H. ([id number]) Wound #7 Right Ischial Tuberosity o Return Appointment in 1 week. Wound #8 Left Malleolus o Return Appointment in 1 week. Wound #9 Left Ischial Tuberosity o Return Appointment in 1 week. Home Health Wound #10 [place] Visits - [place] Nurse may visit PRN to address patientos wound care needs. o FACE TO FACE ENCOUNTER: MEDICARE and MEDICAID PATIENTS: I certify that this patient is under my care and that I had a face-to-face encounter that meets the physician face-to-face encounter requirements with this patient on this date. The encounter with the patient was in whole or in part for the following MEDICAL CONDITION: (primary reason for [place]) MEDICAL NECESSITY: I certify, that based on my findings, NURSING services are a medically necessary home health service. HOME BOUND STATUS: I certify that my clinical findings support that this patient is homebound (i.e., Due to  illness or injury, pt requires aid of supportive devices such as crutches, cane, wheelchairs, walkers, the use of special transportation or the assistance of another person to leave their place of residence. There is a normal inability to leave the home and doing so requires considerable and taxing effort. Other absences are for medical reasons / religious services and are infrequent or  of short duration when for other reasons). o If current dressing causes regression in wound condition, may D/C ordered dressing product/s and apply Normal Saline Moist Dressing daily until next [place] / Other MD appointment. [place] of regression in wound condition at [phone]. o Please direct any NON-WOUND related issues/requests for orders to patient's Primary Care Physician Wound #11 Right Calcaneus o [place] Visits - [place] Nurse may visit PRN to address patientos wound care needs. o FACE TO FACE ENCOUNTER: MEDICARE and MEDICAID PATIENTS: I certify that this patient is under my care and that I had a face-to-face encounter that meets the physician face-to-face encounter requirements with this patient on this date. The encounter with the patient was in whole or in part for the following MEDICAL CONDITION: (primary reason for [place]) MEDICAL NECESSITY: I certify, that based on my findings, NURSING services are a medically necessary home health service. HOME BOUND STATUS: I certify that my clinical findings support that this patient is homebound (i.e., Due to illness or injury, pt requires aid of supportive devices such as crutches, cane, wheelchairs, walkers, the use of special transportation or the assistance of another person to leave their place of residence. There is a normal inability to leave the home and doing so requires considerable and taxing effort. Other absences are for medical reasons / religious services and  are infrequent or of short duration when for other reasons). o If current dressing causes regression in wound condition, may D/C ordered dressing product/s and apply Normal Saline Moist Dressing daily until next [place] / Other MD appointment. [place] of regression in wound condition at [phone]. [place], Mykia H. ([id number]) o Please direct any NON-WOUND related issues/requests for orders to patient's Primary Care Physician Wound #3 Midline Back o [place] Visits - [place] Nurse may visit PRN to address patientos wound care needs. o FACE TO FACE ENCOUNTER: MEDICARE and MEDICAID PATIENTS: I certify that this patient is under my care and that I had a face-to-face encounter that meets the physician face-to-face encounter requirements with this patient on this date. The encounter with the patient was in whole or in part for the following MEDICAL CONDITION: (primary reason for [place]) MEDICAL NECESSITY: I certify, that based on my findings, NURSING services are a medically necessary home health service. HOME BOUND STATUS: I certify that my clinical findings support that this patient is homebound (i.e., Due to illness or injury, pt requires aid of supportive devices such as crutches, cane, wheelchairs, walkers, the use of special transportation or the assistance of another person to leave their place of residence. There is a normal inability to leave the home and doing so requires considerable and taxing effort. Other absences are for medical reasons / religious services and are infrequent or of short duration when for other reasons). o If current dressing causes regression in wound condition, may D/C ordered dressing product/s and apply Normal Saline Moist Dressing daily until next [place] / Other MD appointment. [place] of regression in wound condition at [phone]. o Please  direct any NON-WOUND related issues/requests for orders to patient's Primary Care Physician Wound #7 Right Ischial [place] Visits - [place] Nurse may visit PRN to address patientos wound care needs. o FACE TO FACE ENCOUNTER: MEDICARE and MEDICAID PATIENTS: I certify that this patient is under my care and that I  had a face-to-face encounter that meets the physician face-to-face encounter requirements with this patient on this date. The encounter with the patient was in whole or in part for the following MEDICAL CONDITION: (primary reason for [place]) MEDICAL NECESSITY: I certify, that based on my findings, NURSING services are a medically necessary home health service. HOME BOUND STATUS: I certify that my clinical findings support that this patient is homebound (i.e., Due to illness or injury, pt requires aid of supportive devices such as crutches, cane, wheelchairs, walkers, the use of special transportation or the assistance of another person to leave their place of residence. There is a normal inability to leave the home and doing so requires considerable and taxing effort. Other absences are for medical reasons / religious services and are infrequent or of short duration when for other reasons). o If current dressing causes regression in wound condition, may D/C ordered dressing product/s and apply Normal Saline Moist Dressing daily until next [place] / Other MD appointment. [place] of regression in wound condition at [phone]. o Please direct any NON-WOUND related issues/requests for orders to patient's Primary Care Physician Wound #8 Left [place] Visits - [place] Nurse may visit PRN to address patientos wound care needs. o FACE TO FACE ENCOUNTER: MEDICARE and MEDICAID PATIENTS: I certify that this patient is under my care and that I had a  face-to-face encounter that meets the physician face-to-face [place], [place]. ([id number]) encounter requirements with this patient on this date. The encounter with the patient was in whole or in part for the following MEDICAL CONDITION: (primary reason for [place]) MEDICAL NECESSITY: I certify, that based on my findings, NURSING services are a medically necessary home health service. HOME BOUND STATUS: I certify that my clinical findings support that this patient is homebound (i.e., Due to illness or injury, pt requires aid of supportive devices such as crutches, cane, wheelchairs, walkers, the use of special transportation or the assistance of another person to leave their place of residence. There is a normal inability to leave the home and doing so requires considerable and taxing effort. Other absences are for medical reasons / religious services and are infrequent or of short duration when for other reasons). o If current dressing causes regression in wound condition, may D/C ordered dressing product/s and apply Normal Saline Moist Dressing daily until next [place] / Other MD appointment. [place] of regression in wound condition at [phone]. o Please direct any NON-WOUND related issues/requests for orders to patient's Primary Care Physician Wound #9 Left Ischial [place] Visits - [place] Nurse may visit PRN to address patientos wound care needs. o FACE TO FACE ENCOUNTER: MEDICARE and MEDICAID PATIENTS: I certify that this patient is under my care and that I had a face-to-face encounter that meets the physician face-to-face encounter requirements with this patient on this date. The encounter with the patient was in whole or in part for the following MEDICAL CONDITION: (primary reason for [place]) MEDICAL NECESSITY: I certify, that based on my findings, NURSING services are a  medically necessary home health service. HOME BOUND STATUS: I certify that my clinical findings support that this patient is homebound (i.e., Due to illness or injury, pt requires aid of supportive devices such as crutches, cane, wheelchairs, walkers, the use of special transportation or the assistance of another person to leave their place of residence. There  is a normal inability to leave the home and doing so requires considerable and taxing effort. Other absences are for medical reasons / religious services and are infrequent or of short duration when for other reasons). o If current dressing causes regression in wound condition, may D/C ordered dressing product/s and apply Normal Saline Moist Dressing daily until next [place] / Other MD appointment. [place] of regression in wound condition at [phone]. o Please direct any NON-WOUND related issues/requests for orders to patient's Primary Care Physician Electronic Signature(s) Signed: 10/17/2015 5:19:21 PM By: Montey Hora Signed: 10/20/2015 4:17:59 PM By: Christin Fudge MD, FACS Entered By: Montey Hora on 10/17/2015 16:39:22 Logue, Abygale H. ([id number]) -------------------------------------------------------------------------------- Problem List Details Patient Name: Proctor, Audine H. Date of Service: 10/17/2015 3:30 PM Medical Record Number: [id number] Patient Account Number: [id number] Date of Birth/Sex: [birth year] (69 y.o. Female) Treating RN: Montey Hora Primary Care Physician: Paulita Cradle Other Clinician: Referring Physician: Paulita Cradle Treating Physician/Extender: Frann Rider in Treatment: 31 Active Problems ICD-10 Encounter Code Description Active Date Diagnosis E11.621 Type 2 diabetes mellitus with foot ulcer 03/13/2015 Yes L89.613 Pressure ulcer of right heel, stage 3 03/13/2015 Yes F17.218 Nicotine dependence, cigarettes, with other nicotine- 03/13/2015  Yes induced disorders L89.100 Pressure ulcer of unspecified part of back, unstageable 05/02/2015 Yes L89.213 Pressure ulcer of right hip, stage 3 08/05/2015 Yes Inactive Problems Resolved Problems ICD-10 Code Description Active Date Resolved Date L89.013 Pressure ulcer of right elbow, stage 3 03/13/2015 03/13/2015 [id number] Pressure ulcer of left hip, stage 2 05/30/2015 05/30/2015 L89.153 Pressure ulcer of sacral region, stage 3 05/30/2015 05/30/2015 Electronic Signature(s) [name], Atisha Lemmie Evens ([id number]) Signed: 10/17/2015 4:23:14 PM By: Christin Fudge MD, FACS Entered By: Christin Fudge on 10/17/2015 16:23:14 Filkins, [place] H. ([id number]) -------------------------------------------------------------------------------- Progress Note Details Patient Name: Eppard, Nirali H. Date of Service: 10/17/2015 3:30 PM Medical Record Number: [id number] Patient Account Number: [id number] Date of Birth/Sex: 1946/04/15 (69 y.o. Female) Treating RN: Montey Hora Primary Care Physician: Paulita Cradle Other Clinician: Referring Physician: Paulita Cradle Treating Physician/Extender: Frann Rider in Treatment: 31 Subjective Chief Complaint Information obtained from Patient Patient presents to the wound care center for a consult due non healing wound. Ulcers on the right elbow and the right heel for about 1 month. History of Present Illness (HPI) The following HPI elements were documented for the patient's wound: Location: Ulceration on the right heel and the right elbow. Quality: Patient reports experiencing a dull pain to affected area(s). Severity: Patient states wound (s) are getting better. Duration: Patient has had the wound for < 4 weeks prior to presenting for treatment Timing: Pain in wound is Intermittent (comes and goes Context: The wound appeared gradually over time Modifying Factors: Consults to this date include:Augmentin and Bactrim and also some heel protection with duoderm Associated  Signs and Symptoms: Patient reports having difficulty standing for long periods. 70 year old female with history of peripheral neuropathy, history of diet controlled diabetes mellitus type 2, history of alcoholism here for wound consult sent by her PCP Dr. Sherilyn Cooter. She has pressure ulcers at her right elbow, bilateral heels. Plain films of right calcaneus without acute bony process. Patient started by PCP on Augmentin, Bactrim as per orders, DuoDerm dressings applied - reports some improvement in her ulcer since last seen. Denies fever, chills, nausea, vomiting, diarrhea. She had a right humerus fracture in the middle of May and has had no surgery and arm is in a sling. She is also been laying in the bed for quite  a while. Past medical history significant for essential hypertension, osteoporosis, peripheral neuropathy, alcoholism, ataxia, personal history of breast cancer treated with surgery chemotherapy and radiation and this was done in December 2010. she is also status post laparoscopic cholecystectomy, pilonidal cyst excision, subcutaneous port placement, partial mastectomy on the left side, skin cancer removal. 03/21/2015 -- she says overall she's been doing better and continues to smoke about 15 cigarettes a day. 03/21/2015 - her orthopedic doctor has said she may require surgery for her right humerus fracture. 04/04/2015 -- her orthopedic surgery has been scheduled for August 11. 04/18/2015 -- she is doing fine as far as her elbow and her right heel goes but she has developed some redness over prominence on her thoracic spine and wanted me to take a look at this. Mankey, Dinorah H. ([id number]) 05/02/2015 -- she had her surgery done and now is in a sling and support. Her back has developed a pressure injury of undetermined stage. She seems to be in better spirits. 05/16/2015 -- last week her right heel was looking great and we had healed it out, but she has not been offloading appropriately  and has a deep tissue injury on the right heel again. The area on her right elbow has opened out with slough and the area in the thoracic spine is also getting worse. 05/30/2015 -- she has developed 2 new ulcerations one on her left ischial tuberosity and one on the sacral region. She is still working on getting up smoking but is also unable to take her vitamins as she says she develops a diarrhea when she takes vitamins. She has increased her intake of proteins. 06/06/2015 -- the patient's husband manages to get her a low air loss mattress with initiating pressure but did not know how to use it exactly and the patient was not happy about using it. Overall she says she's been feeling better. 07/11/2015 -- . Discussed a surgical opinion for debridement and application of a wound VAC, 2 weeks ago but the patient has been reluctant to get a surgical opinion as she wants to avoid surgery. 07/18/2015 -- they have an appointment to see Dr. Tamala Julian this coming Wednesday. 07/29/2015 -- they saw Dr. Tamala Julian in his office and he did a debridement of the wound and removed significant amount of slough. This is in addition to the debridement I had done previously on Friday where a large amount of the eschar was removed. He did not recommend the application of wound VAC. Addendum : Dr. Thompson Caul note was reviewed via EPIC and details noted as above. 08/05/2015 -- over the last week she has developed a another pressure injury to her right hip and has had significant discoloration of the skin and an eschar there. 08/15/2015 -- she is still smoking about a pack of cigarettes a day and does not seem to want to quit. They have not been able to talk to the vendor regarding the air mattress and I will ask them to get in touch again. She is reluctant to take vitamins and does admit her nutrition is poor. 08/22/2015 -- she has been unable to tolerate her vitamins and continues to smoke. They're working on getting a low air  loss mattress and have been speaking with the vendor. 09/19/2015 -- since her last visit and was admitted to the hospital between 09/09/2015 and 09/16/2015. She was thought to have an active sepsis possibly from one of her decubitus ulcers but nothing was grown except for an MSSA from her  thoracic spine region. CT of this area did not show any osteomyelitis. Been given IV antibiotics which included vancomycin and Zosyn in the hospital under the care of Dr. [name] the ID specialist she was sent home on oral Keflex 500 mg 3 times a day for 2 weeks. he will consider an MRI in the outpatient setting to completely rule out osteomyelitis of the spine. 10/03/2015 --readmitted to hospital on 09/23/2015 for general debility, lethargy and possible sepsis and workup was in progress. Seen by Dr. [name] and he would also like a workup for underlying malignancy as she s had previous ultrasounds of the breasts suggesting findings of concern. Workup done so far does not suggest deep bony infection. She was treated for a pneumonia and received Zosyn and vancomycin. She had been recommended Cipro 500 twice a day and doxycycline 100 mg twice a day once she was to be [place] home. The antibiotics were to be stopped on 10/07/2015. 10/17/2015 -- patient is feeling much better and has been eating better and doing her offloading as much as possible. Objective Woelfel, Pierce H. ([id number]) Constitutional Pulse regular. Respirations normal and unlabored. Afebrile. Vitals Time Taken: 3:39 PM, Height: 65 in, Weight: 100 lbs, BMI: 16.6, Temperature: 98.3 F, Pulse: 108 bpm, Respiratory Rate: 18 breaths/min, Blood Pressure: 128/83 mmHg. Eyes Nonicteric. Reactive to light. Ears, Nose, Mouth, and Throat Lips, teeth, and gums WNL.Marland Kitchen Moist mucosa without lesions. Neck supple and nontender. No palpable supraclavicular or cervical adenopathy. Normal sized without goiter. Respiratory WNL. No retractions.. Breath sounds  WNL, No rubs, rales, rhonchi, or wheeze.. Cardiovascular Heart rhythm and rate regular, no murmur or gallop.. Pedal Pulses WNL. No clubbing, cyanosis or edema. Chest Breasts symmetical and no nipple discharge.. Breast tissue WNL, no masses, lumps, or tenderness.. Lymphatic No adneopathy. No adenopathy. No adenopathy. Musculoskeletal Adexa without tenderness or enlargement.. Digits and nails w/o clubbing, cyanosis, infection, petechiae, ischemia, or inflammatory conditions.Marland Kitchen Psychiatric Judgement and insight Intact.. No evidence of depression, anxiety, or agitation.. General Notes: the left lateral malleolus eschar and subcutaneous tissue were sharply debrided with a forcep and scissors and there was debris going down to the connective tissue. Minimal bleeding controlled with pressure The rest of the wounds have less slough and more granulation tissue and we will continue to use Santyl on these. Integumentary (Hair, Skin) No suspicious lesions. No crepitus or fluctuance. No peri-wound warmth or erythema. No masses.. Wound #10 status is Open. Original cause of wound was Gradually Appeared. The wound is located on the Right,Medial Foot. The wound measures 0.2cm length x 0.2cm width x 0.1cm depth; 0.031cm^2 area and 0.003cm^3 volume. The wound is limited to skin breakdown. There is no tunneling or undermining noted. There is a medium amount of serous drainage noted. The wound margin is flat and intact. There is no granulation within the wound bed. There is a large (67-100%) amount of necrotic tissue within the wound bed including Eschar. The periwound skin appearance exhibited: Erythema. The surrounding wound skin Mory, Tajha H. ([id number]) color is noted with erythema which is circumferential. Periwound temperature was noted as No Abnormality. The periwound has tenderness on palpation. Wound #11 status is Open. Original cause of wound was Pressure Injury. The wound is located on the Right  Calcaneus. The wound measures 2.6cm length x 3.2cm width x 0.1cm depth; 6.535cm^2 area and 0.653cm^3 volume. The wound is limited to skin breakdown. There is no tunneling or undermining noted. There is a medium amount of serous drainage noted. The wound margin is flat  and intact. There is large (67-100%) pink granulation within the wound bed. There is a small (1-33%) amount of necrotic tissue within the wound bed including Eschar and Adherent Slough. The periwound skin appearance exhibited: Maceration, Moist. The periwound skin appearance did not exhibit: Callus, Crepitus, Excoriation, Fluctuance, Friable, Induration, Localized Edema, Rash, Scarring, Dry/Scaly, Atrophie Blanche, Cyanosis, Ecchymosis, Hemosiderin Staining, Mottled, Pallor, Rubor, Erythema. Wound #3 status is Open. Original cause of wound was Pressure Injury. The wound is located on the Midline Back. The wound measures 2.2cm length x 2.3cm width x 0.1cm depth; 3.974cm^2 area and 0.397cm^3 volume. Wound #7 status is Open. Original cause of wound was Pressure Injury. The wound is located on the Right Ischial Tuberosity. The wound measures 0.2cm length x 0.9cm width x 0.1cm depth; 0.141cm^2 area and 0.014cm^3 volume. The wound is limited to skin breakdown. There is no tunneling or undermining noted. There is a medium amount of serosanguineous drainage noted. The wound margin is distinct with the outline attached to the wound base. There is small (1-33%) pink granulation within the wound bed. There is a large (67-100%) amount of necrotic tissue within the wound bed including Adherent Slough. The periwound skin appearance exhibited: Localized Edema, Moist. The periwound skin appearance did not exhibit: Callus, Crepitus, Excoriation, Fluctuance, Friable, Induration, Rash, Scarring, Dry/Scaly, Maceration, Atrophie Blanche, Cyanosis, Ecchymosis, Hemosiderin Staining, Mottled, Pallor, Rubor, Erythema. Periwound temperature was noted as No  Abnormality. Wound #8 status is Open. Original cause of wound was Gradually Appeared. The wound is located on the Left Malleolus. The wound measures 1.5cm length x 1cm width x 0.2cm depth; 1.178cm^2 area and 0.236cm^3 volume. The wound is limited to skin breakdown. There is no tunneling or undermining noted. There is a medium amount of serous drainage noted. The wound margin is flat and intact. There is small (1-33%) red, pink granulation within the wound bed. There is a large (67-100%) amount of necrotic tissue within the wound bed including Eschar. The periwound skin appearance exhibited: Moist, Erythema. The surrounding wound skin color is noted with erythema which is circumferential. Periwound temperature was noted as No Abnormality. The periwound has tenderness on palpation. Wound #9 status is Open. Original cause of wound was Gradually Appeared. The wound is located on the Left Ischial Tuberosity. The wound measures 1cm length x 1.4cm width x 0.1cm depth; 1.1cm^2 area and 0.11cm^3 volume. The wound is limited to skin breakdown. There is no tunneling or undermining noted. There is a medium amount of serous drainage noted. The wound margin is flat and intact. There is no granulation within the wound bed. There is a large (67-100%) amount of necrotic tissue within the wound bed including Eschar and Adherent Slough. The periwound skin appearance exhibited: Moist. Assessment Diiorio, Quida H. ([id number]) Active Problems ICD-10 E11.621 - Type 2 diabetes mellitus with foot ulcer L89.613 - Pressure ulcer of right heel, stage 3 F17.218 - Nicotine dependence, cigarettes, with other nicotine-induced disorders L89.100 - Pressure ulcer of unspecified part of back, unstageable L89.213 - Pressure ulcer of right hip, stage 3 Procedures Wound #8 Wound #8 is a Pressure Ulcer located on the Left Malleolus . There was a Skin/Subcutaneous Tissue Debridement [id number]) debridement with total area of 1.5  sq cm performed by Christin Fudge, MD. with the following instrument(s): Forceps and Scissors to remove Viable and Non-Viable tissue/material including Fibrin/Slough, Eschar, and Subcutaneous after achieving pain control using Lidocaine 4% Topical Solution. A time out was conducted prior to the start of the procedure. A Minimum amount of bleeding  was controlled with Pressure. The procedure was tolerated well with a pain level of 0 throughout and a pain level of 0 following the procedure. Post Debridement Measurements: 1.5cm length x 1cm width x 0.2cm depth; 0.236cm^3 volume. Post debridement Stage noted as Category/Stage II. Post procedure Diagnosis Wound #8: Same as Pre-Procedure Plan Wound Cleansing: Wound #10 Right,Medial Foot: Clean wound with Normal Saline. Wound #11 Right Calcaneus: Clean wound with Normal Saline. Wound #3 Midline Back: Clean wound with Normal Saline. Wound #7 Right Ischial Tuberosity: Clean wound with Normal Saline. Wound #8 Left Malleolus: Clean wound with Normal Saline. Wound #9 Left Ischial Tuberosity: Clean wound with Normal Saline. Anesthetic: Wound #10 Right,Medial Foot: Topical Lidocaine 4% cream applied to wound bed prior to debridement Rumple, Korine H. ([id number]) Wound #11 Right Calcaneus: Topical Lidocaine 4% cream applied to wound bed prior to debridement Wound #3 Midline Back: Topical Lidocaine 4% cream applied to wound bed prior to debridement Wound #7 Right Ischial Tuberosity: Topical Lidocaine 4% cream applied to wound bed prior to debridement Wound #8 Left Malleolus: Topical Lidocaine 4% cream applied to wound bed prior to debridement Wound #9 Left Ischial Tuberosity: Topical Lidocaine 4% cream applied to wound bed prior to debridement Skin Barriers/Peri-Wound Care: Wound #10 Right,Medial Foot: Skin Prep Wound #11 Right Calcaneus: Skin Prep Wound #3 Midline Back: Skin Prep Wound #7 Right Ischial Tuberosity: Skin Prep Wound #8 Left  Malleolus: Skin Prep Wound #9 Left Ischial Tuberosity: Skin Prep Primary Wound Dressing: Wound #10 Right,Medial Foot: Other: - betadine paint Wound #11 Right Calcaneus: Aquacel Ag Wound #8 Left Malleolus: Aquacel Ag Wound #3 Midline Back: Santyl Ointment Other: - Lotrisome around the wound Wound #7 Right Ischial Tuberosity: Santyl Ointment Wound #9 Left Ischial Tuberosity: Santyl Ointment Secondary Dressing: Wound #10 Right,Medial Foot: Boardered Foam Dressing Wound #11 Right Calcaneus: Boardered Foam Dressing Wound #3 Midline Back: Boardered Foam Dressing Wound #7 Right Ischial Tuberosity: Boardered Foam Dressing Wound #8 Left Malleolus: Boardered Foam Dressing Wound #9 Left Ischial Tuberosity: Boardered Foam Dressing Dressing Change Frequency: Sterne, Stepheni H. ([id number]) Wound #10 Right,Medial Foot: Change dressing every day. Wound #11 Right Calcaneus: Change dressing every day. Wound #3 Midline Back: Change dressing every day. Wound #7 Right Ischial Tuberosity: Change dressing every day. Wound #8 Left Malleolus: Change dressing every day. Wound #9 Left Ischial Tuberosity: Change dressing every day. Follow-up Appointments: Wound #10 Right,Medial Foot: Return Appointment in 1 week. Wound #11 Right Calcaneus: Return Appointment in 1 week. Wound #3 Midline Back: Return Appointment in 1 week. Wound #7 Right Ischial Tuberosity: Return Appointment in 1 week. Wound #8 Left Malleolus: Return Appointment in 1 week. Wound #9 Left Ischial Tuberosity: Return Appointment in 1 week. Home Health: Wound #10 Right,Medial Foot: [place] Visits - [facility] Nurse may visit PRN to address patient s wound care needs. FACE TO FACE ENCOUNTER: MEDICARE and MEDICAID PATIENTS: I certify that this patient is under my care and that I had a face-to-face encounter that meets the physician face-to-face encounter requirements with this patient on this date. The  encounter with the patient was in whole or in part for the following MEDICAL CONDITION: (primary reason for [place]) MEDICAL NECESSITY: I certify, that based on my findings, NURSING services are a medically necessary home health service. HOME BOUND STATUS: I certify that my clinical findings support that this patient is homebound (i.e., Due to illness or injury, pt requires aid of supportive devices such as crutches, cane, wheelchairs, walkers, the use of special  transportation or the assistance of another person to leave their place of residence. There is a normal inability to leave the home and doing so requires considerable and taxing effort. Other absences are for medical reasons / religious services and are infrequent or of short duration when for other reasons). If current dressing causes regression in wound condition, may D/C ordered dressing product/s and apply Normal Saline Moist Dressing daily until next [place] / Other MD appointment. [place] of regression in wound condition at [phone]. Please direct any NON-WOUND related issues/requests for orders to patient's Primary Care Physician Wound #11 Right Calcaneus: [place] Visits - [facility] Nurse may visit PRN to address patient s wound care needs. FACE TO FACE ENCOUNTER: MEDICARE and MEDICAID PATIENTS: I certify that this patient is under my care and that I had a face-to-face encounter that meets the physician face-to-face encounter requirements with this patient on this date. The encounter with the patient was in whole or in part for the following MEDICAL CONDITION: (primary reason for [place]) MEDICAL NECESSITY: I certify, that based on my findings, NURSING services are a medically necessary home health service. HOME Dokken, Alaijah H. ([id number]) BOUND STATUS: I certify that my clinical findings support that this patient is homebound (i.e., Due to illness or  injury, pt requires aid of supportive devices such as crutches, cane, wheelchairs, walkers, the use of special transportation or the assistance of another person to leave their place of residence. There is a normal inability to leave the home and doing so requires considerable and taxing effort. Other absences are for medical reasons / religious services and are infrequent or of short duration when for other reasons). If current dressing causes regression in wound condition, may D/C ordered dressing product/s and apply Normal Saline Moist Dressing daily until next [place] / Other MD appointment. [place] of regression in wound condition at [phone]. Please direct any NON-WOUND related issues/requests for orders to patient's Primary Care Physician Wound #3 Midline Back: [place] Visits - [facility] Nurse may visit PRN to address patient s wound care needs. FACE TO FACE ENCOUNTER: MEDICARE and MEDICAID PATIENTS: I certify that this patient is under my care and that I had a face-to-face encounter that meets the physician face-to-face encounter requirements with this patient on this date. The encounter with the patient was in whole or in part for the following MEDICAL CONDITION: (primary reason for [place]) MEDICAL NECESSITY: I certify, that based on my findings, NURSING services are a medically necessary home health service. HOME BOUND STATUS: I certify that my clinical findings support that this patient is homebound (i.e., Due to illness or injury, pt requires aid of supportive devices such as crutches, cane, wheelchairs, walkers, the use of special transportation or the assistance of another person to leave their place of residence. There is a normal inability to leave the home and doing so requires considerable and taxing effort. Other absences are for medical reasons / religious services and are infrequent or of short duration when  for other reasons). If current dressing causes regression in wound condition, may D/C ordered dressing product/s and apply Normal Saline Moist Dressing daily until next [place] / Other MD appointment. [place] of regression in wound condition at [phone]. Please direct any NON-WOUND related issues/requests for orders to patient's Primary Care Physician Wound #7 Right Ischial Tuberosity: [place] Visits - [facility]  Nurse may visit PRN to address patient s wound care needs. FACE TO FACE ENCOUNTER: MEDICARE and MEDICAID PATIENTS: I certify that this patient is under my care and that I had a face-to-face encounter that meets the physician face-to-face encounter requirements with this patient on this date. The encounter with the patient was in whole or in part for the following MEDICAL CONDITION: (primary reason for [place]) MEDICAL NECESSITY: I certify, that based on my findings, NURSING services are a medically necessary home health service. HOME BOUND STATUS: I certify that my clinical findings support that this patient is homebound (i.e., Due to illness or injury, pt requires aid of supportive devices such as crutches, cane, wheelchairs, walkers, the use of special transportation or the assistance of another person to leave their place of residence. There is a normal inability to leave the home and doing so requires considerable and taxing effort. Other absences are for medical reasons / religious services and are infrequent or of short duration when for other reasons). If current dressing causes regression in wound condition, may D/C ordered dressing product/s and apply Normal Saline Moist Dressing daily until next [place] / Other MD appointment. [place] of regression in wound condition at [phone]. Please direct any NON-WOUND related issues/requests for orders to patient's Primary Care  Physician Wound #8 Left Malleolus: [place] Visits - [facility] Nurse may visit PRN to address patient s wound care needs. FACE TO FACE ENCOUNTER: MEDICARE and MEDICAID PATIENTS: I certify that this patient is under my care and that I had a face-to-face encounter that meets the physician face-to-face encounter requirements with this patient on this date. The encounter with the patient was in whole or in part for the following MEDICAL CONDITION: (primary reason for [place]) MEDICAL NECESSITY: I certify, that based on my findings, NURSING services are a medically necessary home health service. HOME Korber, Trynity H. ([id number]) BOUND STATUS: I certify that my clinical findings support that this patient is homebound (i.e., Due to illness or injury, pt requires aid of supportive devices such as crutches, cane, wheelchairs, walkers, the use of special transportation or the assistance of another person to leave their place of residence. There is a normal inability to leave the home and doing so requires considerable and taxing effort. Other absences are for medical reasons / religious services and are infrequent or of short duration when for other reasons). If current dressing causes regression in wound condition, may D/C ordered dressing product/s and apply Normal Saline Moist Dressing daily until next [place] / Other MD appointment. [place] of regression in wound condition at [phone]. Please direct any NON-WOUND related issues/requests for orders to patient's Primary Care Physician Wound #9 Left Ischial Tuberosity: [place] Visits - [facility] Nurse may visit PRN to address patient s wound care needs. FACE TO FACE ENCOUNTER: MEDICARE and MEDICAID PATIENTS: I certify that this patient is under my care and that I had a face-to-face encounter that meets the physician face-to-face encounter requirements with this  patient on this date. The encounter with the patient was in whole or in part for the following MEDICAL CONDITION: (primary reason for [place]) MEDICAL NECESSITY: I certify, that based on my findings, NURSING services are a medically necessary home health service. HOME BOUND STATUS: I certify that my clinical findings support that this patient is homebound (i.e., Due to illness or injury, pt requires aid of supportive devices such  as crutches, cane, wheelchairs, walkers, the use of special transportation or the assistance of another person to leave their place of residence. There is a normal inability to leave the home and doing so requires considerable and taxing effort. Other absences are for medical reasons / religious services and are infrequent or of short duration when for other reasons). If current dressing causes regression in wound condition, may D/C ordered dressing product/s and apply Normal Saline Moist Dressing daily until next [place] / Other MD appointment. [place] of regression in wound condition at [phone]. Please direct any NON-WOUND related issues/requests for orders to patient's Primary Care Physician We have discussed using Santyl to the thoracic spine and the right and left hip. We will use Aquacel Ag on the right heel and left heel. Appropriate form padding has also been recommended. nutrition and offloading has again been discussed and they're being very compliant. Electronic Signature(s) Signed: 10/20/2015 4:20:15 PM By: Christin Fudge MD, FACS Previous Signature: 10/17/2015 4:26:23 PM Version By: Christin Fudge MD, FACS Entered By: Christin Fudge on 10/20/2015 16:20:15 Chandran, Kalasia H. ([id number]) -------------------------------------------------------------------------------- SuperBill Details Patient Name: [name], [place] H. Date of Service: 10/17/2015 Medical Record Number: [id number] Patient Account Number: [id number] Date of  Birth/Sex: [birth year] (70 y.o. 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, stage 3 Facility Procedures CPT4 Code: [id number] Description: [id number] - DEB SUBQ TISSUE 20 SQ CM/< ICD-10 Description Diagnosis 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, stage 3 L89.100 Pressure ulcer of unspecified part of back, uns L89.213 Pressure ulcer of  right hip, stage 3 Modifier: tageable Quantity: 1 Physician Procedures CPT4 Code: [id number] Description: [id number] - WC PHYS SUBQ TISS 20 SQ CM ICD-10 Description Diagnosis 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

## 2015-10-23 ENCOUNTER — Encounter: Payer: Medicare Other | Admitting: Surgery

## 2015-10-23 DIAGNOSIS — E11621 Type 2 diabetes mellitus with foot ulcer: Secondary | ICD-10-CM | POA: Diagnosis not present

## 2015-10-24 NOTE — Progress Notes (Signed)
[name], [name] ([id number]) Visit Report for 10/23/2015 Chief Complaint Document Details Patient Name: [name], [name] Date of Service: 10/23/2015 2:15 PM Medical Record Patient Account Number: [id number] Number: Afful, RN, BSN, Treating RN: May 01, 1946 (70 y.o. [name] Date of Birth/Sex: Female) Other Clinician: Primary Care Physician: Carrie Mew, MIRIAM Treating Christin Fudge Referring Physician: Paulita Cradle Physician/Extender: Suella Grove in Treatment: 20 Information Obtained from: Patient Chief Complaint Patient presents to the wound care center for a consult due non healing wound. Ulcers on the right elbow and the right heel for about 1 month. Electronic Signature(s) Signed: 10/23/2015 3:25:08 PM By: Christin Fudge MD, FACS Entered By: Christin Fudge on 10/23/2015 15:25:08 Sampedro, Rehema H. ([id number]) -------------------------------------------------------------------------------- Debridement Details Patient Name: Mckenzie Fusi, [name] Date of Service: 10/23/2015 2:15 PM Medical Record Patient Account Number: [id number] Number: Afful, RN, BSN, Treating RN: [birth year] (70 y.o. [name] Date of Birth/Sex: Female) Other Clinician: Primary Care Physician: Carrie Mew, MIRIAM Treating Verdell Kincannon Referring Physician: Paulita Cradle Physician/Extender: Suella Grove in Treatment: 32 Debridement Performed for Wound #11 Right Calcaneus Assessment: Performed By: Physician Christin Fudge, MD Debridement: Debridement Pre-procedure Yes Verification/Time Out Taken: Start Time: 15:07 Pain Control: Lidocaine 4% Topical Solution Level: Skin/Subcutaneous Tissue Total Area Debrided (L x 3 (cm) x 3.5 (cm) = 10.5 (cm) W): Tissue and other Non-Viable, Eschar, Fibrin/Slough, Subcutaneous material debrided: Instrument: Curette Bleeding: Minimum Hemostasis Achieved: Pressure End Time: 15:12 Procedural Pain: 0 Post Procedural Pain: 0 Response to Treatment: Procedure was tolerated  well Post Debridement Measurements of Total Wound Length: (cm) 3 Stage: Category/Stage II Width: (cm) 3.5 Depth: (cm) 0.1 Volume: (cm) 0.825 Post Procedure Diagnosis Same as Pre-procedure Electronic Signature(s) Signed: 10/23/2015 3:24:34 PM By: Christin Fudge MD, FACS Signed: 10/23/2015 6:15:06 PM By: Regan Lemming BSN, RN Entered By: Christin Fudge on 10/23/2015 15:24:34 Lococo, Kirbi H. ([id number]) -------------------------------------------------------------------------------- Debridement Details Patient Name: [name], [name] Date of Service: 10/23/2015 2:15 PM Medical Record Patient Account Number: [id number] Number: Afful, RN, BSN, Treating RN: Feb 15, 1946 (70 y.o. [name] Date of Birth/Sex: Female) Other Clinician: Primary Care Physician: Carrie Mew, MIRIAM Treating Riniyah Speich Referring Physician: Paulita Cradle Physician/Extender: Suella Grove in Treatment: 32 Debridement Performed for Wound #7 Right Ischial Tuberosity Assessment: Performed By: Physician Christin Fudge, MD Debridement: Debridement Pre-procedure Yes Verification/Time Out Taken: Start Time: 15:04 Pain Control: Lidocaine 4% Topical Solution Level: Skin/Subcutaneous Tissue Total Area Debrided (L x 1 (cm) x 0.3 (cm) = 0.3 (cm) W): Tissue and other Non-Viable, Eschar, Fibrin/Slough, Subcutaneous material debrided: Instrument: Curette Bleeding: Minimum Hemostasis Achieved: Pressure End Time: 15:07 Procedural Pain: 0 Post Procedural Pain: 0 Response to Treatment: Procedure was tolerated well Post Debridement Measurements of Total Wound Length: (cm) 1 Stage: Category/Stage III Width: (cm) 0.3 Depth: (cm) 0.1 Volume: (cm) 0.024 Post Procedure Diagnosis Same as Pre-procedure Electronic Signature(s) Signed: 10/23/2015 3:24:42 PM By: Christin Fudge MD, FACS Signed: 10/23/2015 6:15:06 PM By: Regan Lemming BSN, RN Entered By: Christin Fudge on 10/23/2015 15:24:42 Gelpi, Matilynn H.  ([id number]) -------------------------------------------------------------------------------- Debridement Details Patient Name: [name], [name] Date of Service: 10/23/2015 2:15 PM Medical Record Patient Account Number: [id number] Number: Afful, RN, BSN, Treating RN: 05-19-1946 (70 y.o. [name] Date of Birth/Sex: Female) Other Clinician: Primary Care Physician: Carrie Mew, MIRIAM Treating Christin Fudge Referring Physician: Paulita Cradle Physician/Extender: Weeks in Treatment: 32 Debridement Performed for Wound #9 Left Ischial Tuberosity Assessment: Performed By: Physician Christin Fudge, MD Debridement: Debridement Pre-procedure Yes Verification/Time Out Taken: Start Time: 15:12 Pain Control: Lidocaine 4% Topical Solution Level: Skin/Subcutaneous Tissue Total Area Debrided (L x 1.3 (cm)  x 1.5 (cm) = 1.95 (cm) W): Tissue and other Non-Viable, Eschar, Fibrin/Slough, Subcutaneous material debrided: Instrument: Curette Bleeding: Minimum Hemostasis Achieved: Pressure End Time: 15:16 Procedural Pain: 0 Post Procedural Pain: 0 Response to Treatment: Procedure was tolerated well Post Debridement Measurements of Total Wound Length: (cm) 1.3 Stage: Category/Stage II Width: (cm) 1.5 Depth: (cm) 0.1 Volume: (cm) 0.153 Post Procedure Diagnosis Same as Pre-procedure Electronic Signature(s) Signed: 10/23/2015 3:24:50 PM By: Christin Fudge MD, FACS Signed: 10/23/2015 6:15:06 PM By: Regan Lemming BSN, RN Entered By: Christin Fudge on 10/23/2015 15:24:49 Manninen, [name] ([id number]) -------------------------------------------------------------------------------- HPI Details Patient Name: [name], [name] Date of Service: 10/23/2015 2:15 PM Medical Record Patient Account Number: [id number] Number: Afful, RN, BSN, Treating RN: 1945/10/26 (70 y.o. [name] Date of Birth/Sex: Female) Other Clinician: Primary Care Physician: Carrie Mew, MIRIAM Treating Christin Fudge Referring Physician: Paulita Cradle Physician/Extender: Weeks in Treatment: 32 History of Present Illness Location: Ulceration on the right heel and the right elbow. Quality: Patient reports experiencing a dull pain to affected area(s). Severity: Patient states wound (s) are getting better. Duration: Patient has had the wound for < 4 weeks prior to presenting for treatment Timing: Pain in wound is Intermittent (comes and goes Context: The wound appeared gradually over time Modifying Factors: Consults to this date include:Augmentin and Bactrim and also some heel protection with duoderm Associated Signs and Symptoms: Patient reports having difficulty standing for long periods. HPI Description: 70 year old female with history of peripheral neuropathy, history of diet controlled diabetes mellitus type 2, history of alcoholism here for wound consult sent by her PCP Dr. Sherilyn Cooter. She has pressure ulcers at her right elbow, bilateral heels. Plain films of right calcaneus without acute bony process. Patient started by PCP on Augmentin, Bactrim as per orders, DuoDerm dressings applied - reports some improvement in her ulcer since last seen. Denies fever, chills, nausea, vomiting, diarrhea. She had a right humerus fracture in the middle of May and has had no surgery and arm is in a sling. She is also been laying in the bed for quite a while. Past medical history significant for essential hypertension, osteoporosis, peripheral neuropathy, alcoholism, ataxia, personal history of breast cancer treated with surgery chemotherapy and radiation and this was done in December 2010. she is also status post laparoscopic cholecystectomy, pilonidal cyst excision, subcutaneous port placement, partial mastectomy on the left side, skin cancer removal. 03/21/2015 -- she says overall she's been doing better and continues to smoke about 15 cigarettes a day. 03/21/2015 - her orthopedic doctor has said she may  require surgery for her right humerus fracture. 04/04/2015 -- her orthopedic surgery has been scheduled for August 11. 04/18/2015 -- she is doing fine as far as her elbow and her right heel goes but she has developed some redness over prominence on her thoracic spine and wanted me to take a look at this. 05/02/2015 -- she had her surgery done and now is in a sling and support. Her back has developed a pressure injury of undetermined stage. She seems to be in better spirits. 05/16/2015 -- last week her right heel was looking great and we had healed it out, but she has not been offloading appropriately and has a deep tissue injury on the right heel again. The area on her right elbow has opened out with slough and the area in the thoracic spine is also getting worse. 05/30/2015 -- she has developed 2 new ulcerations one on her left ischial tuberosity and one on the sacral region. She  is still working on getting up smoking but is also unable to take her vitamins as she says she Korver, Marveen H. ([id number]) develops a diarrhea when she takes vitamins. She has increased her intake of proteins. 06/06/2015 -- the patient's husband manages to get her a low air loss mattress with initiating pressure but did not know how to use it exactly and the patient was not happy about using it. Overall she says she's been feeling better. 07/11/2015 -- . Discussed a surgical opinion for debridement and application of a wound VAC, 2 weeks ago but the patient has been reluctant to get a surgical opinion as she wants to avoid surgery. 07/18/2015 -- they have an appointment to see Dr. Tamala Julian this coming Wednesday. 07/29/2015 -- they saw Dr. Tamala Julian in his office and he did a debridement of the wound and removed significant amount of slough. This is in addition to the debridement I had done previously on Friday where a large amount of the eschar was removed. He did not recommend the application of wound VAC. Addendum : Dr.  Thompson Caul note was reviewed via EPIC and details noted as above. 08/05/2015 -- over the last week she has developed a another pressure injury to her right hip and has had significant discoloration of the skin and an eschar there. 08/15/2015 -- she is still smoking about a pack of cigarettes a day and does not seem to want to quit. They have not been able to talk to the vendor regarding the air mattress and I will ask them to get in touch again. She is reluctant to take vitamins and does admit her nutrition is poor. 08/22/2015 -- she has been unable to tolerate her vitamins and continues to smoke. They're working on getting a low air loss mattress and have been speaking with the vendor. 09/19/2015 -- since her last visit and was admitted to the hospital between 09/09/2015 and 09/16/2015. She was thought to have an active sepsis possibly from one of her decubitus ulcers but nothing was grown except for an MSSA from her thoracic spine region. CT of this area did not show any osteomyelitis. Been given IV antibiotics which included vancomycin and Zosyn in the hospital under the care of Dr. [name] the ID specialist she was sent home on oral Keflex 500 mg 3 times a day for 2 weeks. he will consider an MRI in the outpatient setting to completely rule out osteomyelitis of the spine. 10/03/2015 --readmitted to hospital on 09/23/2015 for general debility, lethargy and possible sepsis and workup was in progress. Seen by Dr. [name] and he would also like a workup for underlying malignancy as sheos had previous ultrasounds of the breasts suggesting findings of concern. Workup done so far does not suggest deep bony infection. She was treated for a pneumonia and received Zosyn and vancomycin. She had been recommended Cipro 500 twice a day and doxycycline 100 mg twice a day once she was to be [place] home. The antibiotics were to be stopped on 10/07/2015. 10/17/2015 -- patient is feeling much better and has  been eating better and doing her offloading as much as possible. 10/23/2015 -- she has an appointment to see Dr. [name] tomorrow but other than that has been doing as much as possible with offloading and increasing her diet. Electronic Signature(s) Signed: 10/23/2015 3:25:47 PM By: Christin Fudge MD, FACS Entered By: Christin Fudge on 10/23/2015 15:25:47 Brechtel, Lavena H. ([id number]) -------------------------------------------------------------------------------- Physical Exam Details Patient Name: Verret, [name] Date of Service: 10/23/2015  2:15 PM Medical Record Patient Account Number: [id number] Number: Afful, RN, BSN, Treating RN: [birth year] (70 y.o. [name] Date of Birth/Sex: Female) Other Clinician: Primary Care Physician: Carrie Mew, MIRIAM Treating Christin Fudge Referring Physician: Paulita Cradle Physician/Extender: Weeks in Treatment: 32 Constitutional . Pulse regular. Respirations normal and unlabored. Afebrile. . Eyes Nonicteric. Reactive to light. Ears, Nose, Mouth, and Throat Lips, teeth, and gums WNL.Marland Kitchen Moist mucosa without lesions. Neck supple and nontender. No palpable supraclavicular or cervical adenopathy. Normal sized without goiter. Respiratory WNL. No retractions.. Cardiovascular Pedal Pulses WNL. No clubbing, cyanosis or edema. Lymphatic No adneopathy. No adenopathy. No adenopathy. Musculoskeletal Adexa without tenderness or enlargement.. Digits and nails w/o clubbing, cyanosis, infection, petechiae, ischemia, or inflammatory conditions.. Integumentary (Hair, Skin) No suspicious lesions. No crepitus or fluctuance. No peri-wound warmth or erythema. No masses.Marland Kitchen Psychiatric Judgement and insight Intact.. No evidence of depression, anxiety, or agitation.. Notes several of her wounds had subcutaneous debris and due to application of the sample has been soft enough so that I could use a curette and sharply debride the area of her thoracic spine,  right and left heel and also the area of both hips. Minimal bleeding was controlled with pressure. Electronic Signature(s) Signed: 10/23/2015 3:26:48 PM By: Christin Fudge MD, FACS Entered By: Christin Fudge on 10/23/2015 15:26:47 Saravia, Hope HMarland Kitchen ([id number]) -------------------------------------------------------------------------------- Physician Orders Details Patient Name: Mckenzie Fusi, Jazelyn H. Date of Service: 10/23/2015 2:15 PM Medical Record Patient Account Number: [id number] Number: Afful, RN, BSN, Treating RN: 13-Apr-1946 (70 y.o. [name] Date of Birth/Sex: Female) Other Clinician: Primary Care Physician: Carrie Mew, MIRIAM Treating Inda Mcglothen Referring Physician: Paulita Cradle Physician/Extender: Suella Grove in Treatment: 49 Verbal / Phone Orders: Yes Clinician: Afful, RN, BSN, Rita Read Back and Verified: Yes Diagnosis Coding Wound Cleansing Wound #10 Right,Medial Foot o Cleanse wound with mild soap and water o May Shower, gently pat wound dry prior to applying new dressing. Wound #11 Right Calcaneus o Cleanse wound with mild soap and water o May Shower, gently pat wound dry prior to applying new dressing. Wound #3 Midline Back o Cleanse wound with mild soap and water o May Shower, gently pat wound dry prior to applying new dressing. Wound #7 Right Ischial Tuberosity o Cleanse wound with mild soap and water o May Shower, gently pat wound dry prior to applying new dressing. Wound #8 Left Malleolus o Cleanse wound with mild soap and water o May Shower, gently pat wound dry prior to applying new dressing. Wound #9 Left Ischial Tuberosity o Cleanse wound with mild soap and water o May Shower, gently pat wound dry prior to applying new dressing. Anesthetic Wound #10 Right,Medial Foot o Topical Lidocaine 4% cream applied to wound bed prior to debridement Wound #11 Right Calcaneus o Topical Lidocaine 4% cream applied to wound bed prior to  debridement Wound #3 Midline Back o Topical Lidocaine 4% cream applied to wound bed prior to debridement Wound #7 Right Ischial Tuberosity Boesen, Ivoree H. ([id number]) o Topical Lidocaine 4% cream applied to wound bed prior to debridement Wound #8 Left Malleolus o Topical Lidocaine 4% cream applied to wound bed prior to debridement Wound #9 Left Ischial Tuberosity o Topical Lidocaine 4% cream applied to wound bed prior to debridement Primary Wound Dressing Wound #10 Right,Medial Foot o Santyl Ointment Wound #11 Right Calcaneus o Santyl Ointment Wound #3 Midline Back o Santyl Ointment Wound #7 Right Ischial Tuberosity o Santyl Ointment Wound #8 Left Malleolus o Santyl Ointment Wound #9 Left Ischial Tuberosity o Santyl Ointment Secondary Dressing  Wound #10 Right,Medial Foot o Boardered Foam Dressing Wound #11 Right Calcaneus o Boardered Foam Dressing Wound #3 Midline Back o Boardered Foam Dressing Wound #7 Right Ischial Tuberosity o Boardered Foam Dressing Wound #8 Left Malleolus o Boardered Foam Dressing Wound #9 Left Ischial Tuberosity o Boardered Foam Dressing Dressing Change Frequency Wound #10 Right,Medial Foot H[place]ke[place], Kaysa H. ([id number]) o Change dressing every day. Wound #11 Right Calcaneus o Change dressing every day. Wound #3 Midline Back o Change dressing every day. Wound #7 Right Ischial Tuberosity o Change dressing every day. Wound #8 Left Malleolus o Change dressing every day. Wound #9 Left Ischial Tuberosity o Change dressing every day. Follow-up Appointments Wound #10 Right,Medial Foot o Return Appointment in 1 week. Wound #11 Right Calcaneus o Return Appointment in 1 week. Wound #3 Midline Back o Return Appointment in 1 week. Wound #7 Right Ischial Tuberosity o Return Appointment in 1 week. Wound #8 Left Malleolus o Return Appointment in 1 week. Wound #9 Left Ischial Tuberosity o Return  Appointment in 1 week. Home Health Wound #10 [place] [facility] - [place] Nurse may visit PRN to address patientos wound care needs. o FACE TO FACE ENCOUNTER: MEDICARE and MEDICAID PATIENTS: I certify that this patient is under my care and that I had a face-to-face encounter that meets the physician face-to-face encounter requirements with this patient on this date. The encounter with the patient was in whole or in part for the following MEDICAL CONDITION: (primary reason for [place]) MEDICAL NECESSITY: I certify, that based on my findings, NURSING services are a medically necessary home health service. HOME BOUND STATUS: I certify that my clinical findings support that this patient is homebound (i.e., Due to illness or injury, pt requires aid of supportive devices such as crutches, cane, wheelchairs, walkers, the use of special transportation or the assistance of another person to leave their place of residence. There is a Brann, Schwanda H. ([id number]) normal inability to leave the home and doing so requires considerable and taxing effort. Other absences are for medical reasons / religious services and are infrequent or of short duration when for other reasons). o If current dressing causes regression in wound condition, may D/C ordered dressing product/s and apply Normal Saline Moist Dressing daily until next [place] / Other MD appointment. [place] of regression in wound condition at [phone]. o Please direct any NON-WOUND related issues/requests for orders to patient's Primary Care Physician Wound #11 Right Calcaneus o [place] [facility] - [place] Nurse may visit PRN to address patientos wound care needs. o FACE TO FACE ENCOUNTER: MEDICARE and MEDICAID PATIENTS: I certify that this patient is under my care and that I had a face-to-face encounter that meets the  physician face-to-face encounter requirements with this patient on this date. The encounter with the patient was in whole or in part for the following MEDICAL CONDITION: (primary reason for [place]) MEDICAL NECESSITY: I certify, that based on my findings, NURSING services are a medically necessary home health service. HOME BOUND STATUS: I certify that my clinical findings support that this patient is homebound (i.e., Due to illness or injury, pt requires aid of supportive devices such as crutches, cane, wheelchairs, walkers, the use of special transportation or the assistance of another person to leave their place of residence. There is a normal inability to leave the home and doing so requires considerable and taxing effort. Other absences are for  medical reasons / religious services and are infrequent or of short duration when for other reasons). o If current dressing causes regression in wound condition, may D/C ordered dressing product/s and apply Normal Saline Moist Dressing daily until next [place] / Other MD appointment. [place] of regression in wound condition at [phone]. o Please direct any NON-WOUND related issues/requests for orders to patient's Primary Care Physician Wound #3 Midline Back o [place] [facility] - [place] Nurse may visit PRN to address patientos wound care needs. o FACE TO FACE ENCOUNTER: MEDICARE and MEDICAID PATIENTS: I certify that this patient is under my care and that I had a face-to-face encounter that meets the physician face-to-face encounter requirements with this patient on this date. The encounter with the patient was in whole or in part for the following MEDICAL CONDITION: (primary reason for [place]) MEDICAL NECESSITY: I certify, that based on my findings, NURSING services are a medically necessary home health service. HOME BOUND STATUS: I certify that my clinical  findings support that this patient is homebound (i.e., Due to illness or injury, pt requires aid of supportive devices such as crutches, cane, wheelchairs, walkers, the use of special transportation or the assistance of another person to leave their place of residence. There is a normal inability to leave the home and doing so requires considerable and taxing effort. Other absences are for medical reasons / religious services and are infrequent or of short duration when for other reasons). o If current dressing causes regression in wound condition, may D/C ordered dressing product/s and apply Normal Saline Moist Dressing daily until next [place] / Other MD appointment. [place] of regression in wound condition at [phone]. o Please direct any NON-WOUND related issues/requests for orders to patient's Primary Care Physician [place], Aliscia HMarland Kitchen ([id number]) Wound #7 Right Ischial [place] [facility] - [place] Nurse may visit PRN to address patientos wound care needs. o FACE TO FACE ENCOUNTER: MEDICARE and MEDICAID PATIENTS: I certify that this patient is under my care and that I had a face-to-face encounter that meets the physician face-to-face encounter requirements with this patient on this date. The encounter with the patient was in whole or in part for the following MEDICAL CONDITION: (primary reason for [place]) MEDICAL NECESSITY: I certify, that based on my findings, NURSING services are a medically necessary home health service. HOME BOUND STATUS: I certify that my clinical findings support that this patient is homebound (i.e., Due to illness or injury, pt requires aid of supportive devices such as crutches, cane, wheelchairs, walkers, the use of special transportation or the assistance of another person to leave their place of residence. There is a normal inability to leave the home and doing so requires  considerable and taxing effort. Other absences are for medical reasons / religious services and are infrequent or of short duration when for other reasons). o If current dressing causes regression in wound condition, may D/C ordered dressing product/s and apply Normal Saline Moist Dressing daily until next [place] / Other MD appointment. [place] of regression in wound condition at [phone]. o Please direct any NON-WOUND related issues/requests for orders to patient's Primary Care Physician Wound #8 Left [place] [facility] - [place] Nurse may visit PRN to address patientos wound care needs. o FACE TO FACE ENCOUNTER: MEDICARE and MEDICAID PATIENTS: I certify that  this patient is under my care and that I had a face-to-face encounter that meets the physician face-to-face encounter requirements with this patient on this date. The encounter with the patient was in whole or in part for the following MEDICAL CONDITION: (primary reason for [place]) MEDICAL NECESSITY: I certify, that based on my findings, NURSING services are a medically necessary home health service. HOME BOUND STATUS: I certify that my clinical findings support that this patient is homebound (i.e., Due to illness or injury, pt requires aid of supportive devices such as crutches, cane, wheelchairs, walkers, the use of special transportation or the assistance of another person to leave their place of residence. There is a normal inability to leave the home and doing so requires considerable and taxing effort. Other absences are for medical reasons / religious services and are infrequent or of short duration when for other reasons). o If current dressing causes regression in wound condition, may D/C ordered dressing product/s and apply Normal Saline Moist Dressing daily until next [place] / Other MD appointment. [place] of regression in wound condition at [phone]. o Please direct any NON-WOUND related issues/requests for orders to patient's Primary Care Physician Wound #9 Left Ischial [place] Visits - [place] Nurse may visit PRN to address patientos wound care needs. o FACE TO FACE ENCOUNTER: MEDICARE and MEDICAID PATIENTS: I certify that this patient is under my care and that I had a face-to-face encounter that meets the physician face-to-face encounter requirements with this patient on this date. The encounter with the patient was in whole or in part for the following MEDICAL CONDITION: (primary reason for [place]) Kemple, [name] ([id number]) MEDICAL NECESSITY: I certify, that based on my findings, NURSING services are a medically necessary home health service. HOME BOUND STATUS: I certify that my clinical findings support that this patient is homebound (i.e., Due to illness or injury, pt requires aid of supportive devices such as crutches, cane, wheelchairs, walkers, the use of special transportation or the assistance of another person to leave their place of residence. There is a normal inability to leave the home and doing so requires considerable and taxing effort. Other absences are for medical reasons / religious services and are infrequent or of short duration when for other reasons). o If current dressing causes regression in wound condition, may D/C ordered dressing product/s and apply Normal Saline Moist Dressing daily until next [place] / Other MD appointment. [place] of regression in wound condition at [phone]. o Please direct any NON-WOUND related issues/requests for orders to patient's Primary Care Physician Electronic Signature(s) Signed: 10/23/2015 4:43:36 PM By: Christin Fudge MD, FACS Signed: 10/23/2015 6:15:06 PM By: Regan Lemming BSN, RN Entered By: Regan Lemming on 10/23/2015  15:17:07 Ales, Angeleen H. ([id number]) -------------------------------------------------------------------------------- Problem List Details Patient Name: Yan, Nikkol H. Date of Service: 10/23/2015 2:15 PM Medical Record Patient Account Number: [id number] Number: Afful, RN, BSN, Treating RN: 1946/02/21 (70 y.o. [name] Date of Birth/Sex: Female) Other Clinician: Primary Care Physician: Carrie Mew, MIRIAM Treating Takyia Sindt Referring Physician: Paulita Cradle Physician/Extender: Suella Grove in Treatment: 32 Active Problems ICD-10 Encounter Code Description Active Date Diagnosis E11.621 Type 2 diabetes mellitus with foot ulcer 03/13/2015 Yes L89.613 Pressure ulcer of right heel, stage 3 03/13/2015 Yes F17.218 Nicotine dependence, cigarettes, with other nicotine- 03/13/2015 Yes induced disorders L89.100 Pressure ulcer of unspecified part of back, unstageable 05/02/2015 Yes L89.213 Pressure ulcer of right hip,  stage 3 08/05/2015 Yes Inactive Problems Resolved Problems ICD-10 Code Description Active Date Resolved Date L89.013 Pressure ulcer of right elbow, stage 3 03/13/2015 03/13/2015 [id number] Pressure ulcer of left hip, stage 2 05/30/2015 05/30/2015 L89.153 Pressure ulcer of sacral region, stage 3 05/30/2015 05/30/2015 Tejera, Khamari H. ([id number]) Electronic Signature(s) Signed: 10/23/2015 3:24:27 PM By: Christin Fudge MD, FACS Entered By: Christin Fudge on 10/23/2015 15:24:27 Fray, Lorilyn H. ([id number]) -------------------------------------------------------------------------------- Progress Note Details Patient Name: Crotteau, Georgeanne H. Date of Service: 10/23/2015 2:15 PM Medical Record Patient Account Number: [id number] Number: Afful, RN, BSN, Treating RN: 1946-02-02 (70 y.o. [name] Date of Birth/Sex: Female) Other Clinician: Primary Care Physician: Carrie Mew, MIRIAM Treating Christin Fudge Referring Physician: Paulita Cradle Physician/Extender: Suella Grove in Treatment:  57 Subjective Chief Complaint Information obtained from Patient Patient presents to the wound care center for a consult due non healing wound. Ulcers on the right elbow and the right heel for about 1 month. History of Present Illness (HPI) The following HPI elements were documented for the patient's wound: Location: Ulceration on the right heel and the right elbow. Quality: Patient reports experiencing a dull pain to affected area(s). Severity: Patient states wound (s) are getting better. Duration: Patient has had the wound for < 4 weeks prior to presenting for treatment Timing: Pain in wound is Intermittent (comes and goes Context: The wound appeared gradually over time Modifying Factors: Consults to this date include:Augmentin and Bactrim and also some heel protection with duoderm Associated Signs and Symptoms: Patient reports having difficulty standing for long periods. 70 year old female with history of peripheral neuropathy, history of diet controlled diabetes mellitus type 2, history of alcoholism here for wound consult sent by her PCP Dr. Sherilyn Cooter. She has pressure ulcers at her right elbow, bilateral heels. Plain films of right calcaneus without acute bony process. Patient started by PCP on Augmentin, Bactrim as per orders, DuoDerm dressings applied - reports some improvement in her ulcer since last seen. Denies fever, chills, nausea, vomiting, diarrhea. She had a right humerus fracture in the middle of May and has had no surgery and arm is in a sling. She is also been laying in the bed for quite a while. Past medical history significant for essential hypertension, osteoporosis, peripheral neuropathy, alcoholism, ataxia, personal history of breast cancer treated with surgery chemotherapy and radiation and this was done in December 2010. she is also status post laparoscopic cholecystectomy, pilonidal cyst excision, subcutaneous port placement, partial mastectomy on the left side, skin  cancer removal. 03/21/2015 -- she says overall she's been doing better and continues to smoke about 15 cigarettes a day. 03/21/2015 - her orthopedic doctor has said she may require surgery for her right humerus fracture. 04/04/2015 -- her orthopedic surgery has been scheduled for August 11. 04/18/2015 -- she is doing fine as far as her elbow and her right heel goes but she has developed some Cona, Etna H. ([id number]) redness over prominence on her thoracic spine and wanted me to take a look at this. 05/02/2015 -- she had her surgery done and now is in a sling and support. Her back has developed a pressure injury of undetermined stage. She seems to be in better spirits. 05/16/2015 -- last week her right heel was looking great and we had healed it out, but she has not been offloading appropriately and has a deep tissue injury on the right heel again. The area on her right elbow has opened out with slough and the area in the thoracic spine is also getting worse.  05/30/2015 -- she has developed 2 new ulcerations one on her left ischial tuberosity and one on the sacral region. She is still working on getting up smoking but is also unable to take her vitamins as she says she develops a diarrhea when she takes vitamins. She has increased her intake of proteins. 06/06/2015 -- the patient's husband manages to get her a low air loss mattress with initiating pressure but did not know how to use it exactly and the patient was not happy about using it. Overall she says she's been feeling better. 07/11/2015 -- . Discussed a surgical opinion for debridement and application of a wound VAC, 2 weeks ago but the patient has been reluctant to get a surgical opinion as she wants to avoid surgery. 07/18/2015 -- they have an appointment to see Dr. Tamala Julian this coming Wednesday. 07/29/2015 -- they saw Dr. Tamala Julian in his office and he did a debridement of the wound and removed significant amount of slough. This is in  addition to the debridement I had done previously on Friday where a large amount of the eschar was removed. He did not recommend the application of wound VAC. Addendum : Dr. Thompson Caul note was reviewed via EPIC and details noted as above. 08/05/2015 -- over the last week she has developed a another pressure injury to her right hip and has had significant discoloration of the skin and an eschar there. 08/15/2015 -- she is still smoking about a pack of cigarettes a day and does not seem to want to quit. They have not been able to talk to the vendor regarding the air mattress and I will ask them to get in touch again. She is reluctant to take vitamins and does admit her nutrition is poor. 08/22/2015 -- she has been unable to tolerate her vitamins and continues to smoke. They're working on getting a low air loss mattress and have been speaking with the vendor. 09/19/2015 -- since her last visit and was admitted to the hospital between 09/09/2015 and 09/16/2015. She was thought to have an active sepsis possibly from one of her decubitus ulcers but nothing was grown except for an MSSA from her thoracic spine region. CT of this area did not show any osteomyelitis. Been given IV antibiotics which included vancomycin and Zosyn in the hospital under the care of Dr. [name] the ID specialist she was sent home on oral Keflex 500 mg 3 times a day for 2 weeks. he will consider an MRI in the outpatient setting to completely rule out osteomyelitis of the spine. 10/03/2015 --readmitted to hospital on 09/23/2015 for general debility, lethargy and possible sepsis and workup was in progress. Seen by Dr. [name] and he would also like a workup for underlying malignancy as she s had previous ultrasounds of the breasts suggesting findings of concern. Workup done so far does not suggest deep bony infection. She was treated for a pneumonia and received Zosyn and vancomycin. She had been recommended Cipro 500 twice a  day and doxycycline 100 mg twice a day once she was to be [place] home. The antibiotics were to be stopped on 10/07/2015. 10/17/2015 -- patient is feeling much better and has been eating better and doing her offloading as much as possible. 10/23/2015 -- she has an appointment to see Dr. [name] tomorrow but other than that has been doing as much as possible with offloading and increasing her diet. Wagley, Karie H. ([id number]) Objective Constitutional Pulse regular. Respirations normal and unlabored. Afebrile. Vitals Time Taken: 2:29  PM, Height: 65 in, Weight: 100 lbs, BMI: 16.6, Temperature: 98 F, Pulse: 113 bpm, Respiratory Rate: 18 breaths/min, Blood Pressure: 124/70 mmHg. Eyes Nonicteric. Reactive to light. Ears, Nose, Mouth, and Throat Lips, teeth, and gums WNL.Marland Kitchen Moist mucosa without lesions. Neck supple and nontender. No palpable supraclavicular or cervical adenopathy. Normal sized without goiter. Respiratory WNL. No retractions.. Cardiovascular Pedal Pulses WNL. No clubbing, cyanosis or edema. Lymphatic No adneopathy. No adenopathy. No adenopathy. Musculoskeletal Adexa without tenderness or enlargement.. Digits and nails w/o clubbing, cyanosis, infection, petechiae, ischemia, or inflammatory conditions.Marland Kitchen Psychiatric Judgement and insight Intact.. No evidence of depression, anxiety, or agitation.. General Notes: several of her wounds had subcutaneous debris and due to application of the sample has been soft enough so that I could use a curette and sharply debride the area of her thoracic spine, right and left heel and also the area of both hips. Minimal bleeding was controlled with pressure. Integumentary (Hair, Skin) No suspicious lesions. No crepitus or fluctuance. No peri-wound warmth or erythema. No masses.. Wound #10 status is Open. Original cause of wound was Gradually Appeared. The wound is located on the Right,Medial Foot. The wound measures 0.4cm length x 0.4cm width  x 0.1cm depth; 0.126cm^2 area and 0.013cm^3 volume. The wound is limited to skin breakdown. There is no tunneling or undermining noted. There is a medium amount of serous drainage noted. The wound margin is flat and intact. There is no granulation within the wound bed. There is a large (67-100%) amount of necrotic tissue within the wound bed including Eschar. The periwound skin appearance exhibited: Dry/Scaly, Moist, Erythema. The periwound skin appearance did not exhibit: Callus, Crepitus, Excoriation, Fluctuance, Friable, Induration, Hinks, Shakayla H. ([id number]) Localized Edema, Rash, Scarring. The surrounding wound skin color is noted with erythema which is circumferential. Periwound temperature was noted as No Abnormality. The periwound has tenderness on palpation. Wound #11 status is Open. Original cause of wound was Pressure Injury. The wound is located on the Right Calcaneus. The wound measures 3cm length x 3.5cm width x 0.1cm depth; 8.247cm^2 area and 0.825cm^3 volume. The wound is limited to skin breakdown. There is no tunneling or undermining noted. There is a medium amount of serous drainage noted. The wound margin is flat and intact. There is small (1-33%) pink granulation within the wound bed. There is a large (67-100%) amount of necrotic tissue within the wound bed including Eschar and Adherent Slough. The periwound skin appearance exhibited: Maceration, Moist. The periwound skin appearance did not exhibit: Callus, Crepitus, Excoriation, Fluctuance, Friable, Induration, Localized Edema, Rash, Scarring, Dry/Scaly, Atrophie Blanche, Cyanosis, Ecchymosis, Hemosiderin Staining, Mottled, Pallor, Rubor, Erythema. Periwound temperature was noted as No Abnormality. The periwound has tenderness on palpation. Wound #3 status is Open. Original cause of wound was Pressure Injury. The wound is located on the Midline Back. The wound measures 3cm length x 3.3cm width x 0.1cm depth; 7.775cm^2 area  and 0.778cm^3 volume. The wound is limited to skin breakdown. There is no tunneling or undermining noted. There is a large amount of serosanguineous drainage noted. The wound margin is distinct with the outline attached to the wound base. There is small (1-33%) granulation within the wound bed. There is a large (67- 100%) amount of necrotic tissue within the wound bed including Eschar and Adherent Slough. The periwound skin appearance exhibited: Moist. The periwound skin appearance did not exhibit: Callus, Crepitus, Excoriation, Fluctuance, Friable, Induration, Localized Edema, Rash, Scarring, Dry/Scaly, Maceration, Atrophie Blanche, Cyanosis, Ecchymosis, Hemosiderin Staining, Mottled, Pallor, Rubor, Erythema. Periwound temperature  was noted as No Abnormality. Wound #7 status is Open. Original cause of wound was Pressure Injury. The wound is located on the Right Ischial Tuberosity. The wound measures 1cm length x 0.3cm width x 0.1cm depth; 0.236cm^2 area and 0.024cm^3 volume. The wound is limited to skin breakdown. There is no tunneling or undermining noted. There is a medium amount of serosanguineous drainage noted. The wound margin is distinct with the outline attached to the wound base. There is small (1-33%) pink granulation within the wound bed. There is a large (67-100%) amount of necrotic tissue within the wound bed including Adherent Slough. The periwound skin appearance exhibited: Moist. The periwound skin appearance did not exhibit: Callus, Crepitus, Excoriation, Fluctuance, Friable, Induration, Localized Edema, Rash, Scarring, Dry/Scaly, Maceration, Atrophie Blanche, Cyanosis, Ecchymosis, Hemosiderin Staining, Mottled, Pallor, Rubor, Erythema. Periwound temperature was noted as No Abnormality. The periwound has tenderness on palpation. Wound #8 status is Open. Original cause of wound was Gradually Appeared. The wound is located on the Left Malleolus. The wound measures 1.4cm length x  1cm width x 0.2cm depth; 1.1cm^2 area and 0.22cm^3 volume. The wound is limited to skin breakdown. There is no tunneling or undermining noted. There is a medium amount of serous drainage noted. The wound margin is flat and intact. There is small (1-33%) red, pink granulation within the wound bed. There is a large (67-100%) amount of necrotic tissue within the wound bed including Eschar. The periwound skin appearance exhibited: Moist, Erythema. The periwound skin appearance did not exhibit: Callus, Crepitus, Excoriation, Fluctuance, Friable, Induration, Localized Edema, Rash, Scarring, Dry/Scaly, Maceration, Atrophie Blanche, Cyanosis, Ecchymosis, Hemosiderin Staining, Mottled, Pallor, Rubor. The surrounding wound skin color is noted with erythema which is circumferential. Periwound temperature was noted as No Abnormality. The periwound has tenderness on palpation. Wound #9 status is Open. Original cause of wound was Gradually Appeared. The wound is located on the Sterling, Aixa H. ([id number]) Left Ischial Tuberosity. The wound measures 1.3cm length x 1.5cm width x 0.1cm depth; 1.532cm^2 area and 0.153cm^3 volume. The wound is limited to skin breakdown. There is no tunneling or undermining noted. There is a medium amount of serous drainage noted. The wound margin is flat and intact. There is no granulation within the wound bed. There is a large (67-100%) amount of necrotic tissue within the wound bed including Eschar and Adherent Slough. The periwound skin appearance exhibited: Moist. The periwound skin appearance did not exhibit: Callus, Crepitus, Excoriation, Fluctuance, Friable, Induration, Localized Edema, Rash, Scarring, Dry/Scaly, Maceration, Atrophie Blanche, Cyanosis, Ecchymosis, Hemosiderin Staining, Mottled, Pallor, Rubor, Erythema. Periwound temperature was noted as No Abnormality. Assessment Active Problems ICD-10 E11.621 - Type 2 diabetes mellitus with foot ulcer L89.613 - Pressure  ulcer of right heel, stage 3 F17.218 - Nicotine dependence, cigarettes, with other nicotine-induced disorders L89.100 - Pressure ulcer of unspecified part of back, unstageable L89.213 - Pressure ulcer of right hip, stage 3 Procedures Wound #11 Wound #11 is a Pressure Ulcer located on the Right Calcaneus . There was a Skin/Subcutaneous Tissue Debridement [id number]) debridement with total area of 10.5 sq cm performed by Christin Fudge, MD. with the following instrument(s): Curette to remove Non-Viable tissue/material including Fibrin/Slough, Eschar, and Subcutaneous after achieving pain control using Lidocaine 4% Topical Solution. A time out was conducted prior to the start of the procedure. A Minimum amount of bleeding was controlled with Pressure. The procedure was tolerated well with a pain level of 0 throughout and a pain level of 0 following the procedure. Post Debridement Measurements: 3cm length  x 3.5cm width x 0.1cm depth; 0.825cm^3 volume. Post debridement Stage noted as Category/Stage II. Post procedure Diagnosis Wound #11: Same as Pre-Procedure Wound #7 Wound #7 is a Pressure Ulcer located on the Right Ischial Tuberosity . There was a Skin/Subcutaneous Tissue Debridement [id number]) debridement with total area of 0.3 sq cm performed by Christin Fudge, MD. with the following instrument(s): Curette to remove Non-Viable tissue/material including Fibrin/Slough, Eschar, and Subcutaneous after achieving pain control using Lidocaine 4% Topical Solution. A time out was conducted prior to the start of the procedure. A Minimum amount of bleeding was controlled with Pressure. The procedure was tolerated well with a pain level of 0 throughout and a pain level of 0 following the Alas, Dee H. ([id number]) procedure. Post Debridement Measurements: 1cm length x 0.3cm width x 0.1cm depth; 0.024cm^3 volume. Post debridement Stage noted as Category/Stage III. Post procedure Diagnosis Wound #7:  Same as Pre-Procedure Wound #9 Wound #9 is a Pressure Ulcer located on the Left Ischial Tuberosity . There was a Skin/Subcutaneous Tissue Debridement [id number]) debridement with total area of 1.95 sq cm performed by Christin Fudge, MD. with the following instrument(s): Curette to remove Non-Viable tissue/material including Fibrin/Slough, Eschar, and Subcutaneous after achieving pain control using Lidocaine 4% Topical Solution. A time out was conducted prior to the start of the procedure. A Minimum amount of bleeding was controlled with Pressure. The procedure was tolerated well with a pain level of 0 throughout and a pain level of 0 following the procedure. Post Debridement Measurements: 1.3cm length x 1.5cm width x 0.1cm depth; 0.153cm^3 volume. Post debridement Stage noted as Category/Stage II. Post procedure Diagnosis Wound #9: Same as Pre-Procedure Plan Wound Cleansing: Wound #10 Right,Medial Foot: Cleanse wound with mild soap and water May Shower, gently pat wound dry prior to applying new dressing. Wound #11 Right Calcaneus: Cleanse wound with mild soap and water May Shower, gently pat wound dry prior to applying new dressing. Wound #3 Midline Back: Cleanse wound with mild soap and water May Shower, gently pat wound dry prior to applying new dressing. Wound #7 Right Ischial Tuberosity: Cleanse wound with mild soap and water May Shower, gently pat wound dry prior to applying new dressing. Wound #8 Left Malleolus: Cleanse wound with mild soap and water May Shower, gently pat wound dry prior to applying new dressing. Wound #9 Left Ischial Tuberosity: Cleanse wound with mild soap and water May Shower, gently pat wound dry prior to applying new dressing. Anesthetic: Wound #10 Right,Medial Foot: Topical Lidocaine 4% cream applied to wound bed prior to debridement Wound #11 Right Calcaneus: Topical Lidocaine 4% cream applied to wound bed prior to debridement Wound #3 Midline  Back: Topical Lidocaine 4% cream applied to wound bed prior to debridement Wound #7 Right Ischial Tuberosity: Poudrier, Vania H. ([id number]) Topical Lidocaine 4% cream applied to wound bed prior to debridement Wound #8 Left Malleolus: Topical Lidocaine 4% cream applied to wound bed prior to debridement Wound #9 Left Ischial Tuberosity: Topical Lidocaine 4% cream applied to wound bed prior to debridement Primary Wound Dressing: Wound #10 Right,Medial Foot: Santyl Ointment Wound #11 Right Calcaneus: Santyl Ointment Wound #3 Midline Back: Santyl Ointment Wound #7 Right Ischial Tuberosity: Santyl Ointment Wound #8 Left Malleolus: Santyl Ointment Wound #9 Left Ischial Tuberosity: Santyl Ointment Secondary Dressing: Wound #10 Right,Medial Foot: Boardered Foam Dressing Wound #11 Right Calcaneus: Boardered Foam Dressing Wound #3 Midline Back: Boardered Foam Dressing Wound #7 Right Ischial Tuberosity: Boardered Foam Dressing Wound #8 Left Malleolus: Boardered Foam Dressing Wound #9  Left Ischial Tuberosity: Boardered Foam Dressing Dressing Change Frequency: Wound #10 Right,Medial Foot: Change dressing every day. Wound #11 Right Calcaneus: Change dressing every day. Wound #3 Midline Back: Change dressing every day. Wound #7 Right Ischial Tuberosity: Change dressing every day. Wound #8 Left Malleolus: Change dressing every day. Wound #9 Left Ischial Tuberosity: Change dressing every day. Follow-up Appointments: Wound #10 Right,Medial Foot: Return Appointment in 1 week. Wound #11 Right Calcaneus: Return Appointment in 1 week. Wound #3 Midline Back: Return Appointment in 1 week. Bartee, Chrishawna H. ([id number]) Wound #7 Right Ischial Tuberosity: Return Appointment in 1 week. Wound #8 Left Malleolus: Return Appointment in 1 week. Wound #9 Left Ischial Tuberosity: Return Appointment in 1 week. Home Health: Wound #10 Right,Medial Foot: [place] Visits -  [facility] Nurse may visit PRN to address patient s wound care needs. FACE TO FACE ENCOUNTER: MEDICARE and MEDICAID PATIENTS: I certify that this patient is under my care and that I had a face-to-face encounter that meets the physician face-to-face encounter requirements with this patient on this date. The encounter with the patient was in whole or in part for the following MEDICAL CONDITION: (primary reason for [place]) MEDICAL NECESSITY: I certify, that based on my findings, NURSING services are a medically necessary home health service. HOME BOUND STATUS: I certify that my clinical findings support that this patient is homebound (i.e., Due to illness or injury, pt requires aid of supportive devices such as crutches, cane, wheelchairs, walkers, the use of special transportation or the assistance of another person to leave their place of residence. There is a normal inability to leave the home and doing so requires considerable and taxing effort. Other absences are for medical reasons / religious services and are infrequent or of short duration when for other reasons). If current dressing causes regression in wound condition, may D/C ordered dressing product/s and apply Normal Saline Moist Dressing daily until next [place] / Other MD appointment. [place] of regression in wound condition at [phone]. Please direct any NON-WOUND related issues/requests for orders to patient's Primary Care Physician Wound #11 Right Calcaneus: [place] Visits - [facility] Nurse may visit PRN to address patient s wound care needs. FACE TO FACE ENCOUNTER: MEDICARE and MEDICAID PATIENTS: I certify that this patient is under my care and that I had a face-to-face encounter that meets the physician face-to-face encounter requirements with this patient on this date. The encounter with the patient was in whole or in part for the following MEDICAL  CONDITION: (primary reason for [place]) MEDICAL NECESSITY: I certify, that based on my findings, NURSING services are a medically necessary home health service. HOME BOUND STATUS: I certify that my clinical findings support that this patient is homebound (i.e., Due to illness or injury, pt requires aid of supportive devices such as crutches, cane, wheelchairs, walkers, the use of special transportation or the assistance of another person to leave their place of residence. There is a normal inability to leave the home and doing so requires considerable and taxing effort. Other absences are for medical reasons / religious services and are infrequent or of short duration when for other reasons). If current dressing causes regression in wound condition, may D/C ordered dressing product/s and apply Normal Saline Moist Dressing daily until next [place] / Other MD appointment. [place] of regression in wound condition at [phone]. Please direct any NON-WOUND related issues/requests for orders to patient's  Primary Care Physician Wound #3 Midline Back: [place] [facility] Nurse may visit PRN to address patient s wound care needs. FACE TO FACE ENCOUNTER: MEDICARE and MEDICAID PATIENTS: I certify that this patient is under my care and that I had a face-to-face encounter that meets the physician face-to-face encounter requirements with this patient on this date. The encounter with the patient was in whole or in part for the following MEDICAL CONDITION: (primary reason for [place]) MEDICAL NECESSITY: I certify, that based on my findings, NURSING services are a medically necessary home health service. HOME BOUND STATUS: I certify that my clinical findings support that this patient is homebound (i.e., Due to illness or injury, pt requires aid of supportive devices such as crutches, cane, wheelchairs, walkers, the use [place], Khylee  H. ([id number]) of special transportation or the assistance of another person to leave their place of residence. There is a normal inability to leave the home and doing so requires considerable and taxing effort. Other absences are for medical reasons / religious services and are infrequent or of short duration when for other reasons). If current dressing causes regression in wound condition, may D/C ordered dressing product/s and apply Normal Saline Moist Dressing daily until next [place] / Other MD appointment. [place] of regression in wound condition at [phone]. Please direct any NON-WOUND related issues/requests for orders to patient's Primary Care Physician Wound #7 Right Ischial Tuberosity: [place] Visits - [facility] Nurse may visit PRN to address patient s wound care needs. FACE TO FACE ENCOUNTER: MEDICARE and MEDICAID PATIENTS: I certify that this patient is under my care and that I had a face-to-face encounter that meets the physician face-to-face encounter requirements with this patient on this date. The encounter with the patient was in whole or in part for the following MEDICAL CONDITION: (primary reason for [place]) MEDICAL NECESSITY: I certify, that based on my findings, NURSING services are a medically necessary home health service. HOME BOUND STATUS: I certify that my clinical findings support that this patient is homebound (i.e., Due to illness or injury, pt requires aid of supportive devices such as crutches, cane, wheelchairs, walkers, the use of special transportation or the assistance of another person to leave their place of residence. There is a normal inability to leave the home and doing so requires considerable and taxing effort. Other absences are for medical reasons / religious services and are infrequent or of short duration when for other reasons). If current dressing causes regression in wound  condition, may D/C ordered dressing product/s and apply Normal Saline Moist Dressing daily until next [place] / Other MD appointment. [place] of regression in wound condition at [phone]. Please direct any NON-WOUND related issues/requests for orders to patient's Primary Care Physician Wound #8 Left Malleolus: [place] Visits - [facility] Nurse may visit PRN to address patient s wound care needs. FACE TO FACE ENCOUNTER: MEDICARE and MEDICAID PATIENTS: I certify that this patient is under my care and that I had a face-to-face encounter that meets the physician face-to-face encounter requirements with this patient on this date. The encounter with the patient was in whole or in part for the following MEDICAL CONDITION: (primary reason for [place]) MEDICAL NECESSITY: I certify, that based on my findings, NURSING services are a medically necessary home health service. HOME BOUND STATUS: I certify that my clinical findings support that this patient  is homebound (i.e., Due to illness or injury, pt requires aid of supportive devices such as crutches, cane, wheelchairs, walkers, the use of special transportation or the assistance of another person to leave their place of residence. There is a normal inability to leave the home and doing so requires considerable and taxing effort. Other absences are for medical reasons / religious services and are infrequent or of short duration when for other reasons). If current dressing causes regression in wound condition, may D/C ordered dressing product/s and apply Normal Saline Moist Dressing daily until next [place] / Other MD appointment. [place] of regression in wound condition at [phone]. Please direct any NON-WOUND related issues/requests for orders to patient's Primary Care Physician Wound #9 Left Ischial Tuberosity: [place] Visits -  [facility] Nurse may visit PRN to address patient s wound care needs. FACE TO FACE ENCOUNTER: MEDICARE and MEDICAID PATIENTS: I certify that this patient is under my care and that I had a face-to-face encounter that meets the physician face-to-face encounter requirements with this patient on this date. The encounter with the patient was in whole or in part for the following MEDICAL CONDITION: (primary reason for [place]) MEDICAL NECESSITY: I certify, that based on my findings, NURSING services are a medically necessary home health service. HOME BOUND STATUS: I certify that my clinical findings support that this patient is homebound (i.e., Due to illness or injury, pt requires aid of supportive devices such as crutches, cane, wheelchairs, walkers, the use [place]l, [name] ([id number]) of special transportation or the assistance of another person to leave their place of residence. There is a normal inability to leave the home and doing so requires considerable and taxing effort. Other absences are for medical reasons / religious services and are infrequent or of short duration when for other reasons). If current dressing causes regression in wound condition, may D/C ordered dressing product/s and apply Normal Saline Moist Dressing daily until next [place] / Other MD appointment. [place] of regression in wound condition at [phone]. Please direct any NON-WOUND related issues/requests for orders to patient's Primary Care Physician I have recommended we use Santyl over all the wounds and continue to offload as well as possible. We have again discussed offloading, vitamin supplements and increase protein and she is agreeable to use this. We will see her back next week. Electronic Signature(s) Signed: 10/23/2015 3:27:24 PM By: Christin Fudge MD, FACS Entered By: Christin Fudge on 10/23/2015 15:27:24 [place], [place] H.  ([id number]) -------------------------------------------------------------------------------- SuperBill Details Patient Name: Mckenzie Fusi, [name] Date of Service: 10/23/2015 Medical Record Patient Account Number: [id number] Number: Afful, RN, BSN, Treating RN: [id number] (70 y.o. 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, stage 3 Facility Procedures CPT4 Code: [id number] Description: [id number] - DEB SUBQ TISSUE 20 SQ CM/< ICD-10 Description Diagnosis 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, stage 3 L89.100 Pressure ulcer of unspecified part of back, unst L89.213 Pressure ulcer of  right hip, stage 3 Modifier: ageable Quantity: 1 Physician Procedures CPT4 Code: [id number] Description: [id number] - WC  PHYS SUBQ TISS 20 SQ CM ICD-10 Description Diagnosis 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 ([id number]) Entered By: Christin Fudge on 10/23/2015 15:28:00

## 2015-10-24 NOTE — Progress Notes (Signed)
[name], [name] ([id number]) Visit Report for 10/23/2015 Arrival Information Details Patient Name: [name], [name] Date of Service: 10/23/2015 2:15 PM Medical Record Number: [id number] Patient Account Number: [id number] Date of Birth/Sex: [birth year] (70 y.o. Female) Treating RN: Afful, RN, BSN, Velva Harman Primary Care Physician: Paulita Cradle Other Clinician: Referring Physician: Paulita Cradle Treating Physician/Extender: Frann Rider in Treatment: 52 Visit Information History Since Last Visit Added or deleted any medications: No Patient Arrived: Walker Any new allergies or adverse reactions: No Arrival Time: 14:25 Had a fall or experienced change in No Accompanied By: husband activities of daily living that may affect Transfer Assistance: None risk of falls: Patient Identification Verified: Yes Signs or symptoms of abuse/neglect since last No Secondary Verification Process Yes visito Completed: Hospitalized since last visit: No Patient Requires Transmission-Based No Has Dressing in Place as Prescribed: Yes Precautions: Pain Present Now: No Patient Has Alerts: No Electronic Signature(s) Signed: 10/23/2015 6:15:06 PM By: Regan Lemming BSN, RN Entered By: Regan Lemming on 10/23/2015 14:26:42 [name], [name] ([id number]) -------------------------------------------------------------------------------- Encounter Discharge Information Details Patient Name: [name], [name] Date of Service: 10/23/2015 2:15 PM Medical Record Number: [id number] Patient Account Number: [id number] Date of Birth/Sex: 10-19-1945 (70 y.o. Female) Treating RN: Baruch Gouty, RN, BSN, Velva Harman Primary Care Physician: Paulita Cradle Other Clinician: Referring Physician: Paulita Cradle Treating Physician/Extender: Frann Rider in Treatment: 71 Encounter Discharge Information Items Discharge Pain Level: 0 Discharge Condition: Stable Ambulatory Status: Walker Discharge Destination: Home Transportation:  Private Auto Accompanied By: husband Schedule Follow-up Appointment: No Medication Reconciliation completed No and provided to Patient/Care Ladarrell Cornwall: Provided on Clinical Summary of Care: 10/23/2015 Form Type Recipient Paper Patient AS Electronic Signature(s) Signed: 10/23/2015 3:37:39 PM By: Ruthine Dose Entered By: Ruthine Dose on 10/23/2015 15:37:38 Wirtz, Luther H. ([id number]) -------------------------------------------------------------------------------- Lower Extremity Assessment Details Patient Name: [name], [name] Date of Service: 10/23/2015 2:15 PM Medical Record Number: [id number] Patient Account Number: [id number] Date of Birth/Sex: 08-03-1946 (70 y.o. Female) Treating RN: Afful, RN, BSN, Velva Harman Primary Care Physician: Paulita Cradle Other Clinician: Referring Physician: Paulita Cradle Treating Physician/Extender: Frann Rider in Treatment: 32 Vascular Assessment Pulses: Posterior Tibial Dorsalis Pedis Palpable: [Left:Yes] [Right:Yes] Extremity colors, hair growth, and conditions: Extremity Color: [Left:Mottled] [Right:Mottled] Hair Growth on Extremity: [Left:No] [Right:No] Temperature of Extremity: [Left:Warm] [Right:Warm] Capillary Refill: [Left:< 3 seconds] [Right:< 3 seconds] Toe Nail Assessment Left: Right: Thick: No No Discolored: No No Deformed: No No Improper Length and Hygiene: No No Electronic Signature(s) Signed: 10/23/2015 6:15:06 PM By: Regan Lemming BSN, RN Entered By: Regan Lemming on 10/23/2015 14:31:46 [name], [name] ([id number]) -------------------------------------------------------------------------------- Multi Wound Chart Details Patient Name: [name], [name] Date of Service: 10/23/2015 2:15 PM Medical Record Number: [id number] Patient Account Number: [id number] Date of Birth/Sex: Mar 02, 1946 (70 y.o. 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] [11:Pressure Ulcer] [3:Pressure Ulcer] Comorbid History: [10:Cataracts, Asthma, Hypertension, Type II Diabetes, Neuropathy, Received Chemotherapy] [11:Cataracts, Asthma, Hypertension, Type II Diabetes, Neuropathy, Received Chemotherapy] [3:Cataracts, Asthma, Hypertension, Type II Diabetes,  Neuropathy, Received Chemotherapy] Date Acquired: [10:09/12/2015] [11:09/29/2015] [3:05/02/2015] Weeks of Treatment: [10:4] [11:2] [3:24] Wound Status: [10:Open] [11:Open] [3:Open] Measurements L  x W x D 0.4x0.4x0.1 [11:3x3.5x0.1] [3:3x3.3x0.1] (cm) Area (cm) : [10:0.126] [11:8.247] [3:7.775] Volume (cm) : [10:0.013] [11:0.825] [3:0.778] % Reduction in Area: [10:46.60%] [11:-1066.50%] [3:-3434.10%] % Reduction in Volume: 45.80% [11:-1062.00%] [3:-3436.40%] Classification: [10:Category/Stage II] [11:Category/Stage II] [3:Category/Stage II] HBO Classification: [10:Grade 1] [11:Grade 1] [3:N/A] Exudate Amount: [10:Medium] [11:Medium] [3:Large] Exudate Type: [10:Serous] [11:Serous] [3:Serosanguineous] Exudate Color: [10:amber] [11:amber] [3:red, brown] Wound Margin: [10:Flat and Intact] [11:Flat and Intact] [3:Distinct, outline attached] Granulation Amount: [10:None Present (0%)] [11:Small (1-33%)] [3:Small (1-33%)] Granulation Quality: [10:N/A] [11:Pink] [3:N/A] Necrotic Amount: [10:Large (67-100%)] [11:Large (67-100%)] [3:Large (67-100%)] Necrotic Tissue: [10:Eschar] [11:Eschar, Adherent  Slough] [3:Eschar, Adherent Slough] Exposed Structures: [10:Fascia: No Fat: No Tendon: No] [11:Fascia: No Fat: No Tendon: No] [3:Fascia: No Fat: No Tendon: No] Muscle: No Muscle: No Muscle: No Joint: No Joint: No Joint: No Bone: No Bone: No Bone: No Limited to Skin Limited to Skin Limited to Skin Breakdown Breakdown Breakdown Epithelialization: None None None Periwound Skin Texture: Edema: No Edema: No Edema: No Excoriation: No Excoriation: No Excoriation: No Induration: No Induration: No Induration: No Callus: No Callus: No Callus: No Crepitus: No Crepitus: No Crepitus: No Fluctuance: No Fluctuance: No Fluctuance: No Friable: No Friable: No Friable: No Rash: No Rash: No Rash: No Scarring: No Scarring: No Scarring: No Periwound Skin Moist: Yes Maceration: Yes Moist: Yes Moisture: Dry/Scaly: Yes Moist: Yes Maceration: No Dry/Scaly: No Dry/Scaly: No Periwound Skin Color: Erythema: Yes Atrophie Blanche: No Atrophie Blanche: No Cyanosis: No Cyanosis: No Ecchymosis: No Ecchymosis: No Erythema: No Erythema: No Hemosiderin Staining: No Hemosiderin Staining: No Mottled: No Mottled: No Pallor: No Pallor: No Rubor: No Rubor: No Erythema Location: Circumferential N/A N/A Temperature: No Abnormality No Abnormality No Abnormality Tenderness on Yes Yes No Palpation: Wound Preparation: Ulcer Cleansing: Ulcer Cleansing: Ulcer Cleansing: Rinsed/Irrigated with Rinsed/Irrigated with Rinsed/Irrigated with Saline Saline Saline Topical Anesthetic Topical Anesthetic Topical Anesthetic Applied: Other: lidocaine Applied: Other: lidocaine Applied: Other: lidocaine 4% 4% 4% Wound Number: [id number]  Photos: No Photos No Photos No Photos Wound Location: Right Ischial Tuberosity Left Malleolus Left Ischial Tuberosity Wounding Event: Pressure Injury Gradually Appeared Gradually Appeared Primary Etiology: Pressure Ulcer Pressure Ulcer Pressure Ulcer Comorbid History:  Cataracts, Asthma, Cataracts, Asthma, Cataracts, Asthma, Hypertension, Type II Hypertension, Type II Hypertension, Type II Diabetes, Neuropathy, Diabetes, Neuropathy, Diabetes, Neuropathy, Received Chemotherapy Received Chemotherapy Received Chemotherapy Date Acquired: 08/01/2015 09/12/2015 09/12/2015 Weeks of Treatment: 11 4 4  Wound Status: Open Open Open [name], [name] ([id number]) Measurements L x W x D 1x0.3x0.1 1.4x1x0.2 1.3x1.5x0.1 (cm) Area (cm) : 0.236 1.1 1.532 Volume (cm) : 0.024 0.22 0.153 % Reduction in Area: 92.50% -2240.40% -681.60% % Reduction in Volume: 92.40% -2344.40% -665.00% Classification: Category/Stage III Category/Stage II Category/Stage II HBO Classification: N/A Grade 0 N/A Exudate Amount: Medium Medium Medium Exudate Type: Serosanguineous Serous Serous Exudate Color: red, brown amber amber Wound Margin: Distinct, outline attached Flat and Intact Flat and Intact Granulation Amount: Small (1-33%) Small (1-33%) None Present (0%) Granulation Quality: Pink Red, Pink N/A Necrotic Amount: Large (67-100%) Large (67-100%) Large (67-100%) Necrotic Tissue: Adherent Slough Eschar Eschar, Adherent Slough Exposed Structures: Fascia: No Fascia: No Fascia: No Fat: No Fat: No Fat: No Tendon: No Tendon: No Tendon: No Muscle: No Muscle: No Muscle: No Joint: No Joint: No Joint: No Bone: No Bone: No Bone: No Limited to Skin Limited to Skin Limited to Skin Breakdown Breakdown Breakdown Epithelialization: None None None Periwound Skin Texture: Edema: No Edema: No Edema: No Excoriation: No Excoriation: No Excoriation: No Induration: No Induration: No Induration:  No Callus: No Callus: No Callus: No Crepitus: No Crepitus: No Crepitus: No Fluctuance: No Fluctuance: No Fluctuance: No Friable: No Friable: No Friable: No Rash: No Rash: No Rash: No Scarring: No Scarring: No Scarring: No Periwound Skin Moist: Yes Moist: Yes Moist:  Yes Moisture: Maceration: No Maceration: No Maceration: No Dry/Scaly: No Dry/Scaly: No Dry/Scaly: No Periwound Skin Color: Atrophie Blanche: No Erythema: Yes Atrophie Blanche: No Cyanosis: No Atrophie Blanche: No Cyanosis: No Ecchymosis: No Cyanosis: No Ecchymosis: No Erythema: No Ecchymosis: No Erythema: No Hemosiderin Staining: No Hemosiderin Staining: No Hemosiderin Staining: No Mottled: No Mottled: No Mottled: No Pallor: No Pallor: No Pallor: No Rubor: No Rubor: No Rubor: No Erythema Location: N/A Circumferential N/A Temperature: No Abnormality No Abnormality No Abnormality Tenderness on Yes Yes No Palpation: Wound Preparation: [name], [name] ([id number]) Ulcer Cleansing: Ulcer Cleansing: Ulcer Cleansing: Rinsed/Irrigated with Rinsed/Irrigated with Rinsed/Irrigated with Saline Saline Saline Topical Anesthetic Topical Anesthetic Topical Anesthetic Applied: Other: lidocaine Applied: Other: Lidocaine Applied: Other: lidocaine 4% 4% 4% Treatment Notes Electronic Signature(s) Signed: 10/23/2015 2:51:16 PM By: Regan Lemming BSN, RN Entered By: Regan Lemming on 10/23/2015 14:51:16 [name], [name] ([id number]) -------------------------------------------------------------------------------- Multi-Disciplinary Care Plan Details Patient Name: [name], Zaiah H. Date of Service: 10/23/2015 2:15 PM Medical Record Number: [id number] Patient Account Number: [id number] Date of Birth/Sex: May 10, 1946 (70 y.o. Female) Treating RN: Afful, RN, BSN, [place] Primary Care Physician: Paulita Cradle Other Clinician: Referring Physician: Paulita Cradle Treating Physician/Extender: Frann Rider in Treatment: 73 Active Inactive Abuse / Safety / Falls / Self Care Management Nursing Diagnoses: Impaired home maintenance Impaired physical mobility Knowledge deficit related to: safety; personal, health (wound), emergency Potential for falls Self care deficit: actual or  potential Goals: Patient will remain injury free Date Initiated: 03/13/2015 Goal Status: Active Patient/caregiver will verbalize understanding of skin care regimen Date Initiated: 03/13/2015 Goal Status: Active Patient/caregiver will verbalize/demonstrate measure taken to improve self care Date Initiated: 03/13/2015 Goal Status: Active Patient/caregiver will verbalize/demonstrate measures taken to improve the patient's personal safety Date Initiated: 03/13/2015 Goal Status: Active Patient/caregiver will verbalize/demonstrate measures taken to prevent injury and/or falls Date Initiated: 03/13/2015 Goal Status: Active Patient/caregiver will verbalize/demonstrate understanding of what to do in case of emergency Date Initiated: 03/13/2015 Goal Status: Active Interventions: Assess fall risk on admission and as needed Assess: immobility, friction, shearing, incontinence upon admission and as needed Assess impairment of mobility on admission and as needed per policy Assess self care needs on admission and as needed Provide education on basic hygiene Wendorff, [place]. ([id number]) Provide education on fall prevention Provide education on personal and home safety Provide education on safe transfers Treatment Activities: Education provided on Basic Hygiene : 03/13/2015 Notes: Orientation to the Wound Care Program Nursing Diagnoses: Knowledge deficit related to the wound healing center program Goals: Patient/caregiver will verbalize understanding of [place] Date Initiated: 03/13/2015 Goal Status: Active Interventions: Provide education on orientation to the wound center Notes: Pressure Nursing Diagnoses: Knowledge deficit related to causes and risk factors for pressure ulcer development Knowledge deficit related to management of pressures ulcers Potential for impaired tissue integrity related to pressure, friction, moisture, and shear Goals: Patient will remain  free from development of additional pressure ulcers Date Initiated: 03/13/2015 Goal Status: Active Patient will remain free of pressure ulcers Date Initiated: 03/13/2015 Goal Status: Active Patient/caregiver will verbalize risk factors for pressure ulcer development Date Initiated: 03/13/2015 Goal Status: Active Patient/caregiver will verbalize understanding of pressure ulcer management Date Initiated: 03/13/2015 Goal Status: Active Interventions: Assess: immobility,  friction, shearing, incontinence upon admission and as needed Maher, Jessika H. ([id number]) Assess offloading mechanisms upon admission and as needed Assess potential for pressure ulcer upon admission and as needed Provide education on pressure ulcers Treatment Activities: Patient referred for home evaluation of offloading devices/mattresses : 10/23/2015 Patient referred for pressure reduction/relief devices : 10/23/2015 Patient referred for seating evaluation to ensure proper offloading : 10/23/2015 Pressure reduction/relief device ordered : 10/23/2015 Test ordered outside of clinic : 10/23/2015 Notes: Wound/Skin Impairment Nursing Diagnoses: Impaired tissue integrity Knowledge deficit related to ulceration/compromised skin integrity Goals: Patient/caregiver will verbalize understanding of skin care regimen Date Initiated: 03/13/2015 Goal Status: Active Ulcer/skin breakdown will have a volume reduction of 30% by week 4 Date Initiated: 03/13/2015 Goal Status: Active Ulcer/skin breakdown will have a volume reduction of 50% by week 8 Date Initiated: 03/13/2015 Goal Status: Active Ulcer/skin breakdown will have a volume reduction of 80% by week 12 Date Initiated: 03/13/2015 Goal Status: Active Ulcer/skin breakdown will heal within 14 weeks Date Initiated: 03/13/2015 Goal Status: Active Interventions: Assess patient/caregiver ability to obtain necessary supplies Assess patient/caregiver ability to perform ulcer/skin care regimen  upon admission and as needed Assess ulceration(s) every visit Provide education on smoking Provide education on ulcer and skin care Treatment Activities: Patient referred to home care : 10/23/2015 KARBER, Jaydee H. ([id number]) Referred to DME Jacarie Pate for dressing supplies : 10/23/2015 Skin care regimen initiated : 10/23/2015 Topical wound management initiated : 10/23/2015 Notes: Electronic Signature(s) Signed: 10/23/2015 2:50:39 PM By: Regan Lemming BSN, RN Entered By: Regan Lemming on 10/23/2015 14:50:38 Ivanov, Nessa H. ([id number]) -------------------------------------------------------------------------------- Patient/Caregiver Education Details Patient Name: [name], Corin H. Date of Service: 10/23/2015 2:15 PM Medical Record Number: [id number] Patient Account Number: [id number] Date of Birth/Gender: 09-23-1945 (70 y.o. Female) Treating RN: Afful, RN, BSN, Velva Harman Primary Care Physician: Paulita Cradle Other Clinician: Referring Physician: Paulita Cradle Treating Physician/Extender: Frann Rider in Treatment: 51 Education Assessment Education Provided To: Patient Education Topics Provided Basic Hygiene: Methods: Explain/Verbal Responses: State content correctly Pressure: Methods: Explain/Verbal Responses: State content correctly Safety: Methods: Explain/Verbal Responses: State content correctly Smoking and Wound Healing: Methods: Explain/Verbal Responses: State content correctly Welcome [facility] [employer]: Methods: Explain/Verbal Responses: State content correctly Wound/Skin Impairment: Methods: Explain/Verbal Responses: State content correctly Electronic Signature(s) Signed: 10/23/2015 6:15:06 PM By: Regan Lemming BSN, RN Entered By: Regan Lemming on 10/23/2015 15:35:58 Dehaan, Carley H. ([id number]) -------------------------------------------------------------------------------- Wound Assessment Details Patient Name: Payette, Xaria H. Date of Service: 10/23/2015 2:15  PM Medical Record Number: [id number] Patient Account Number: [id number] Date of Birth/Sex: [birth year] (70 y.o. Female) Treating RN: Afful, RN, BSN, [place] Primary Care Physician: Paulita Cradle Other Clinician: Referring Physician: Paulita Cradle Treating Physician/Extender: Frann Rider in Treatment: 32 Wound Status Wound Number: 10 Primary Pressure Ulcer Etiology: Wound Location: Right Foot - Medial Wound Open Wounding Event: Gradually Appeared Status: Date Acquired: 09/12/2015 Comorbid Cataracts, Asthma, Hypertension, Type Weeks Of Treatment: 4 History: II Diabetes, Neuropathy, Received Clustered Wound: No Chemotherapy Photos Photo Uploaded By: Regan Lemming on 10/23/2015 18:17:45 Wound Measurements Length: (cm) 0.4 Width: (cm) 0.4 Depth: (cm) 0.1 Area: (cm) 0.126 Volume: (cm) 0.013 % Reduction in Area: 46.6% % Reduction in Volume: 45.8% Epithelialization: None Tunneling: No Undermining: No Wound Description Classification: Category/Stage II Foul Odor A Diabetic Severity (Wagner): Grade 1 Wound Margin: Flat and Intact Exudate Amount: Medium Exudate Type: Serous Exudate Color: amber fter Cleansing: No Wound Bed Granulation Amount: None Present (0%) Exposed Structure Necrotic Amount: Large (67-100%) Fascia Exposed: No Necrotic Quality: Eschar Fat Layer  Exposed: No Wyke, Fabiana H. ([id number]) Tendon Exposed: No Muscle Exposed: No Joint Exposed: No Bone Exposed: No Limited to Skin Breakdown Periwound Skin Texture Texture Color No Abnormalities Noted: No No Abnormalities Noted: No Callus: No Erythema: Yes Crepitus: No Erythema Location: Circumferential Excoriation: No Temperature / Pain Fluctuance: No Temperature: No Abnormality Friable: No Tenderness on Palpation: Yes Induration: No Localized Edema: No Rash: No Scarring: No Moisture No Abnormalities Noted: No Dry / Scaly: Yes Moist: Yes Wound Preparation Ulcer Cleansing:  Rinsed/Irrigated with Saline Topical Anesthetic Applied: Other: lidocaine 4%, Treatment Notes Wound #10 (Right, Medial Foot) 1. Cleansed with: Clean wound with Normal Saline 3. Peri-wound Care: Barrier cream 4. Dressing Applied: Santyl Ointment 5. Secondary Dressing Applied Bordered Foam Dressing Electronic Signature(s) Signed: 10/23/2015 2:47:37 PM By: Regan Lemming BSN, RN Entered By: Regan Lemming on 10/23/2015 14:47:37 Vannatter, Erum H. ([id number]) -------------------------------------------------------------------------------- Wound Assessment Details Patient Name: Sobalvarro, Amrit H. Date of Service: 10/23/2015 2:15 PM Medical Record Number: [id number] Patient Account Number: [id number] Date of Birth/Sex: 1945/12/09 (70 y.o. Female) Treating RN: Afful, RN, BSN, [place] Primary Care Physician: Paulita Cradle Other Clinician: Referring Physician: Paulita Cradle Treating Physician/Extender: Frann Rider in Treatment: 32 Wound Status Wound Number: 11 Primary Pressure Ulcer Etiology: Wound Location: Right Calcaneus Wound Open Wounding Event: Pressure Injury Status: Date Acquired: 09/29/2015 Comorbid Cataracts, Asthma, Hypertension, Type Weeks Of Treatment: 2 History: II Diabetes, Neuropathy, Received Clustered Wound: No Chemotherapy Photos Photo Uploaded By: Regan Lemming on 10/23/2015 18:17:45 Wound Measurements Length: (cm) 3 Width: (cm) 3.5 Depth: (cm) 0.1 Area: (cm) 8.247 Volume: (cm) 0.825 % Reduction in Area: -1066.5% % Reduction in Volume: -1062% Epithelialization: None Tunneling: No Undermining: No Wound Description Classification: Category/Stage II Diabetic Severity Earleen [place]): Grade 1 Wound Margin: Flat and Intact Exudate Amount: Medium Exudate Type: Serous Exudate Color: amber Foul Odor After Cleansing: No Wound Bed Granulation Amount: Small (1-33%) Exposed Structure Granulation Quality: Pink Fascia Exposed: No Necrotic Amount: Large  (67-100%) Fat Layer Exposed: No Fedora, Alyric H. ([id number]) Necrotic Quality: Eschar, Adherent Slough Tendon Exposed: No Muscle Exposed: No Joint Exposed: No Bone Exposed: No Limited to Skin Breakdown Periwound Skin Texture Texture Color No Abnormalities Noted: No No Abnormalities Noted: No Callus: No Atrophie Blanche: No Crepitus: No Cyanosis: No Excoriation: No Ecchymosis: No Fluctuance: No Erythema: No Friable: No Hemosiderin Staining: No Induration: No Mottled: No Localized Edema: No Pallor: No Rash: No Rubor: No Scarring: No Temperature / Pain Moisture Temperature: No Abnormality No Abnormalities Noted: No Tenderness on Palpation: Yes Dry / Scaly: No Maceration: Yes Moist: Yes Wound Preparation Ulcer Cleansing: Rinsed/Irrigated with Saline Topical Anesthetic Applied: Other: lidocaine 4%, Treatment Notes Wound #11 (Right Calcaneus) 1. Cleansed with: Clean wound with Normal Saline 3. Peri-wound Care: Barrier cream 4. Dressing Applied: Santyl Ointment 5. Secondary Dressing Applied Bordered Foam Dressing Electronic Signature(s) Signed: 10/23/2015 2:48:06 PM By: Regan Lemming BSN, RN Entered By: Regan Lemming on 10/23/2015 14:48:06 Halsted, Ragina H. ([id number]) -------------------------------------------------------------------------------- Wound Assessment Details Patient Name: Hartline, Naria H. Date of Service: 10/23/2015 2:15 PM Medical Record Number: [id number] Patient Account Number: [id number] Date of Birth/Sex: May 25, 1946 (70 y.o. Female) Treating RN: Afful, RN, BSN, Velva Harman Primary Care Physician: Paulita Cradle Other Clinician: Referring Physician: Paulita Cradle Treating Physician/Extender: Frann Rider in Treatment: 32 Wound Status Wound Number: 3 Primary Pressure Ulcer Etiology: Wound Location: Back - Midline Wound Open Wounding Event: Pressure Injury Status: Date Acquired: 05/02/2015 Comorbid Cataracts, Asthma, Hypertension,  Type Weeks Of Treatment: 24 History: II Diabetes, Neuropathy, Received Clustered Wound: No Chemotherapy  Photos Photo Uploaded By: Regan Lemming on 10/23/2015 18:17:45 Wound Measurements Length: (cm) 3 Width: (cm) 3.3 Depth: (cm) 0.1 Area: (cm) 7.775 Volume: (cm) 0.778 % Reduction in Area: -3434.1% % Reduction in Volume: -3436.4% Epithelialization: None Tunneling: No Undermining: No Wound Description Classification: Category/Stage II Wound Margin: Distinct, outline attached Exudate Amount: Large Exudate Type: Serosanguineous Exudate Color: red, brown Foul Odor After Cleansing: No Wound Bed Granulation Amount: Small (1-33%) Exposed Structure Necrotic Amount: Large (67-100%) Fascia Exposed: No Necrotic Quality: Eschar, Adherent Slough Fat Layer Exposed: No Tendon Exposed: No Bo, Guelda H. ([id number]) Muscle Exposed: No Joint Exposed: No Bone Exposed: No Limited to Skin Breakdown Periwound Skin Texture Texture Color No Abnormalities Noted: No No Abnormalities Noted: No Callus: No Atrophie Blanche: No Crepitus: No Cyanosis: No Excoriation: No Ecchymosis: No Fluctuance: No Erythema: No Friable: No Hemosiderin Staining: No Induration: No Mottled: No Localized Edema: No Pallor: No Rash: No Rubor: No Scarring: No Temperature / Pain Moisture Temperature: No Abnormality No Abnormalities Noted: No Dry / Scaly: No Maceration: No Moist: Yes Wound Preparation Ulcer Cleansing: Rinsed/Irrigated with Saline Topical Anesthetic Applied: Other: lidocaine 4%, Treatment Notes Wound #3 (Midline Back) 1. Cleansed with: Clean wound with Normal Saline 3. Peri-wound Care: Barrier cream 4. Dressing Applied: Santyl Ointment 5. Secondary Dressing Applied Bordered Foam Dressing Electronic Signature(s) Signed: 10/23/2015 2:48:54 PM By: Regan Lemming BSN, RN Entered By: Regan Lemming on 10/23/2015 14:48:54 Kite, Robinn H.  ([id number]) -------------------------------------------------------------------------------- Wound Assessment Details Patient Name: Kubisiak, Ifrah H. Date of Service: 10/23/2015 2:15 PM Medical Record Number: [id number] Patient Account Number: [id number] Date of Birth/Sex: 11/12/1945 (70 y.o. 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 Wound Open Wounding Event: Pressure Injury Status: Date Acquired: 08/01/2015 Comorbid Cataracts, Asthma, Hypertension, Type Weeks Of Treatment: 11 History: II Diabetes, Neuropathy, Received Clustered Wound: No Chemotherapy Photos Photo Uploaded By: Regan Lemming on 10/23/2015 18:18:25 Wound Measurements Length: (cm) 1 Width: (cm) 0.3 Depth: (cm) 0.1 Area: (cm) 0.236 Volume: (cm) 0.024 % Reduction in Area: 92.5% % Reduction in Volume: 92.4% Epithelialization: None Tunneling: No Undermining: No Wound Description Classification: Category/Stage III Wound Margin: Distinct, outline attached Exudate Amount: Medium Exudate Type: Serosanguineous Exudate Color: red, brown Foul Odor After Cleansing: No Wound Bed Granulation Amount: Small (1-33%) Exposed Structure Granulation Quality: Pink Fascia Exposed: No Necrotic Amount: Large (67-100%) Fat Layer Exposed: No Necrotic Quality: Adherent Slough Tendon Exposed: No Cobern, Aasiya H. ([id number]) Muscle Exposed: No Joint Exposed: No Bone Exposed: No Limited to Skin Breakdown Periwound Skin Texture Texture Color No Abnormalities Noted: No No Abnormalities Noted: No Callus: No Atrophie Blanche: No Crepitus: No Cyanosis: No Excoriation: No Ecchymosis: No Fluctuance: No Erythema: No Friable: No Hemosiderin Staining:  No Induration: No Mottled: No Localized Edema: No Pallor: No Rash: No Rubor: No Scarring: No Temperature / Pain Moisture Temperature: No Abnormality No Abnormalities Noted: No Tenderness on Palpation: Yes Dry / Scaly: No Maceration: No Moist: Yes Wound Preparation Ulcer Cleansing: Rinsed/Irrigated with Saline Topical Anesthetic Applied: Other: lidocaine 4%, Treatment Notes Wound #7 (Right Ischial Tuberosity) 1. Cleansed with: Clean wound with Normal Saline 3. Peri-wound Care: Barrier cream 4. Dressing Applied: Santyl Ointment 5. Secondary Dressing Applied Bordered Foam Dressing Electronic Signature(s) Signed: 10/23/2015 2:49:23 PM By: Regan Lemming BSN, RN Entered By: Regan Lemming on 10/23/2015 14:49:22 Popson, Avalynn H. ([id number]) -------------------------------------------------------------------------------- Wound  Assessment Details Patient Name: GARBIN, Jodee H. Date of Service: 10/23/2015 2:15 PM Medical Record Number: [id number] Patient Account Number: [id number] Date of Birth/Sex: December 20, 1945 (70 y.o. Female) Treating RN: Afful, RN, BSN, [place] Primary Care Physician: Paulita Cradle Other Clinician: Referring Physician: Paulita Cradle Treating Physician/Extender: Frann Rider in Treatment: 32 Wound Status Wound Number: 8 Primary Pressure Ulcer Etiology: Wound Location: Left Malleolus Wound Open Wounding Event: Gradually Appeared Status: Date Acquired: 09/12/2015 Comorbid Cataracts, Asthma, Hypertension, Type Weeks Of Treatment: 4 History: II Diabetes, Neuropathy, Received Clustered Wound: No Chemotherapy Photos Photo Uploaded By: Regan Lemming on 10/23/2015 18:18:25 Wound Measurements Length: (cm) 1.4 Width: (cm) 1 Depth: (cm) 0.2 Area: (cm) 1.1 Volume: (cm) 0.22 % Reduction in Area: -2240.4% % Reduction in Volume: -2344.4% Epithelialization: None Tunneling: No Undermining: No Wound Description Classification: Category/Stage II Foul  Odor A Diabetic Severity (Wagner): Grade 0 Wound Margin: Flat and Intact Exudate Amount: Medium Exudate Type: Serous Exudate Color: amber fter Cleansing: No Wound Bed Granulation Amount: Small (1-33%) Exposed Structure Granulation Quality: Red, Pink Fascia Exposed: No Necrotic Amount: Large (67-100%) Fat Layer Exposed: No Man, Grissel H. ([id number]) Necrotic Quality: Eschar Tendon Exposed: No Muscle Exposed: No Joint Exposed: No Bone Exposed: No Limited to Skin Breakdown Periwound Skin Texture Texture Color No Abnormalities Noted: No No Abnormalities Noted: No Callus: No Atrophie Blanche: No Crepitus: No Cyanosis: No Excoriation: No Ecchymosis: No Fluctuance: No Erythema: Yes Friable: No Erythema Location: Circumferential Induration: No Hemosiderin Staining: No Localized Edema: No Mottled: No Rash: No Pallor: No Scarring: No Rubor: No Moisture Temperature / Pain No Abnormalities Noted: No Temperature: No Abnormality Dry / Scaly: No Tenderness on Palpation: Yes Maceration: No Moist: Yes Wound Preparation Ulcer Cleansing: Rinsed/Irrigated with Saline Topical Anesthetic Applied: Other: Lidocaine 4%, Treatment Notes Wound #8 (Left Malleolus) 1. Cleansed with: Clean wound with Normal Saline 3. Peri-wound Care: Barrier cream 4. Dressing Applied: Santyl Ointment 5. Secondary Dressing Applied Bordered Foam Dressing Electronic Signature(s) Signed: 10/23/2015 2:49:58 PM By: Regan Lemming BSN, RN Entered By: Regan Lemming on 10/23/2015 14:49:58 Devenport, Christel H. ([id number]) -------------------------------------------------------------------------------- Wound Assessment Details Patient Name: Fornwalt, Idania H. Date of Service: 10/23/2015 2:15 PM Medical Record Number: [id number] Patient Account Number: [id number] Date of Birth/Sex: 08-21-1946 (70 y.o. Female) Treating RN: Afful, RN, BSN, [place] Primary Care Physician: Paulita Cradle Other Clinician: Referring  Physician: Paulita Cradle Treating Physician/Extender: Frann Rider in Treatment: 32 Wound Status Wound Number: 9 Primary Pressure Ulcer Etiology: Wound Location: Left Ischial Tuberosity Wound Open Wounding Event: Gradually Appeared Status: Date Acquired: 09/12/2015 Comorbid Cataracts, Asthma, Hypertension, Type Weeks Of Treatment: 4 History: II Diabetes, Neuropathy, Received Clustered Wound: No Chemotherapy Photos Photo Uploaded By: Regan Lemming on 10/23/2015 18:19:08 Wound Measurements Length: (cm) 1.3 Width: (cm) 1.5 Depth: (cm) 0.1 Area: (cm) 1.532 Volume: (cm) 0.153 % Reduction in Area: -681.6% % Reduction in Volume: -665% Epithelialization: None Tunneling: No Undermining: No Wound Description Classification: Category/Stage II Wound Margin: Flat and Intact Exudate Amount: Medium Exudate Type: Serous Exudate Color: amber Foul Odor After Cleansing: No Wound Bed Granulation Amount: None Present (0%) Exposed Structure Necrotic Amount: Large (67-100%) Fascia Exposed: No Necrotic Quality: Eschar, Adherent Slough Fat Layer Exposed: No Tendon Exposed: No Reine, Mikenna H. ([id number]) Muscle Exposed: No Joint Exposed: No Bone Exposed: No Limited to Skin Breakdown Periwound Skin Texture Texture Color No Abnormalities Noted: No No Abnormalities Noted: No Callus: No Atrophie Blanche: No Crepitus: No Cyanosis: No Excoriation: No Ecchymosis: No Fluctuance: No Erythema: No Friable: No Hemosiderin Staining: No Induration:  No Mottled: No Localized Edema: No Pallor: No Rash: No Rubor: No Scarring: No Temperature / Pain Moisture Temperature: No Abnormality No Abnormalities Noted: No Dry / Scaly: No Maceration: No Moist: Yes Wound Preparation Ulcer Cleansing: Rinsed/Irrigated with Saline Topical Anesthetic Applied: Other: lidocaine 4%, Treatment Notes Wound #9 (Left Ischial Tuberosity) 1. Cleansed with: Clean wound with Normal Saline 3.  Peri-wound Care: Barrier cream 4. Dressing Applied: Santyl Ointment 5. Secondary Dressing Applied Bordered Foam Dressing Electronic Signature(s) Signed: 10/23/2015 2:50:24 PM By: Regan Lemming BSN, RN Entered By: Regan Lemming on 10/23/2015 14:50:23 [place], Taline H. ([id number]) -------------------------------------------------------------------------------- Vitals Details Patient Name: [name], Derenda H. Date of Service: 10/23/2015 2:15 PM Medical Record Number: [id number] Patient Account Number: [id number] Date of Birth/Sex: 03-Apr-1946 (70 y.o. Female) Treating RN: Afful, RN, BSN, [place] Primary Care Physician: Paulita Cradle Other Clinician: Referring Physician: Paulita Cradle Treating Physician/Extender: Frann Rider in Treatment: 32 Vital Signs Time Taken: 14:29 Temperature (F): 98 Height (in): 65 Pulse (bpm): 113 Weight (lbs): 100 Respiratory Rate (breaths/min): 18 Body Mass Index (BMI): 16.6 Blood Pressure (mmHg): 124/70 Reference Range: 80 - 120 mg / dl Electronic Signature(s) Signed: 10/23/2015 6:15:06 PM By: Regan Lemming BSN, RN Entered By: Regan Lemming on 10/23/2015 14:33:13

## 2015-10-31 ENCOUNTER — Ambulatory Visit: Payer: Medicare Other | Admitting: General Surgery

## 2015-11-07 ENCOUNTER — Encounter: Payer: Medicare Other | Admitting: Surgery

## 2015-11-07 DIAGNOSIS — E11621 Type 2 diabetes mellitus with foot ulcer: Secondary | ICD-10-CM | POA: Diagnosis not present

## 2015-11-08 NOTE — Progress Notes (Signed)
[name], [name] ([id number]) Visit Report for 11/07/2015 Arrival Information Details Patient Name: [name], [name] Date of Service: 11/07/2015 3:30 PM Medical Record Number: [id number] Patient Account Number: [id number] Date of Birth/Sex: 24-Nov-1945 (70 y.o. Female) Treating RN: Carolyne Fiscal, Debi Primary Care Physician: Paulita Cradle Other Clinician: Referring Physician: Paulita Cradle Treating Physician/Extender: Frann Rider in Treatment: 65 Visit Information History Since Last Visit All ordered tests and consults were completed: No Patient Arrived: Walker Added or deleted any medications: No Arrival Time: 15:39 Any new allergies or adverse reactions: No Accompanied By: husband Had a fall or experienced change in No Transfer Assistance: None activities of daily living that may affect Patient Requires Transmission-Based No risk of falls: Precautions: Signs or symptoms of abuse/neglect since last No Patient Has Alerts: No visito Hospitalized since last visit: No Pain Present Now: No Electronic Signature(s) Signed: 11/07/2015 5:53:24 PM By: Alric Quan Entered By: Alric Quan on 11/07/2015 15:39:29 [name], [name] ([id number]) -------------------------------------------------------------------------------- Encounter Discharge Information Details Patient Name: [name], [name] Date of Service: 11/07/2015 3:30 PM Medical Record Number: [id number] Patient Account Number: [id number] Date of Birth/Sex: 01/12/1946 (70 y.o. Female) Treating RN: Carolyne Fiscal, Debi Primary Care Physician: Paulita Cradle Other Clinician: Referring Physician: Paulita Cradle Treating Physician/Extender: Frann Rider in Treatment: 70 Encounter Discharge Information Items Discharge Pain Level: 0 Discharge Condition: Stable Ambulatory Status: Walker Discharge Destination: Home Transportation: Private Auto Accompanied By: husband Schedule Follow-up Appointment:  Yes Medication Reconciliation completed Yes and provided to Patient/Care [name]: Provided on Clinical Summary of Care: 11/07/2015 Form Type Recipient Paper Patient AS Electronic Signature(s) Signed: 11/07/2015 5:01:39 PM By: Ruthine Dose Entered By: Ruthine Dose on 11/07/2015 17:01:39 Markus, Tris H. ([id number]) -------------------------------------------------------------------------------- Lower Extremity Assessment Details Patient Name: [name], [name] Date of Service: 11/07/2015 3:30 PM Medical Record Number: [id number] Patient Account Number: [id number] Date of Birth/Sex: [birth year] (70 y.o. Female) Treating RN: Carolyne Fiscal, Debi Primary Care Physician: Paulita Cradle Other Clinician: Referring Physician: Paulita Cradle Treating Physician/Extender: Frann Rider in Treatment: 34 Vascular Assessment Pulses: Posterior Tibial Dorsalis Pedis Palpable: [Left:Yes] [Right:Yes] Extremity colors, hair growth, and conditions: Extremity Color: [Left:Mottled] [Right:Mottled] Temperature of Extremity: [Left:Warm] [Right:Warm] Capillary Refill: [Left:< 3 seconds] [Right:< 3 seconds] Toe Nail Assessment Left: Right: Thick: No No Discolored: No No Deformed: No No Improper Length and Hygiene: No No Electronic Signature(s) Signed: 11/07/2015 5:53:24 PM By: Alric Quan Entered By: Alric Quan on 11/07/2015 15:45:02 [name], [name] ([id number]) -------------------------------------------------------------------------------- Multi Wound Chart Details Patient Name: [name], [name] Date of Service: 11/07/2015 3:30 PM Medical Record Number: [id number] Patient Account Number: [id number] Date of Birth/Sex: 10/17/1945 (70 y.o. 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] [11:Pressure Ulcer] [12:Pressure Ulcer] Comorbid History: [10:Cataracts, Asthma, Hypertension, Type II Hypertension, Type II Hypertension, Type II Diabetes, Neuropathy, Diabetes, Neuropathy, Diabetes, Neuropathy, Received Chemotherapy Received Chemotherapy Received Chemotherapy] [11:Cataracts,  Asthma,] [12:Cataracts, Asthma,] Date Acquired: [10:09/12/2015] [11:09/29/2015] [12:11/07/2015] Weeks of Treatment: [10:7] [11:5] [12:0] Wound Status: [10:Open] [11:Open] [12:Open] Measurements L x W x D 0.1x0.1x0.1 [11:3.2x2.5x0.1] [12:0.8x0.9x0.1] (cm) Area (cm) : [10:0.008] [11:6.283] [12:0.565] Volume (cm) : [10:0.001] [11:0.628] [12:0.057] % Reduction in Area: [10:96.60%] [  11:-788.70%] [12:N/A] % Reduction in Volume: 95.80% [11:-784.50%] [12:N/A] Starting Position 1 [11:5] (o'clock): Ending Position 1 [11:1] (o'clock): Maximum Distance 1 [11:2.5] (cm): Undermining: [10:No] [11:Yes] [12:No] Classification: [10:Category/Stage II] [11:Category/Stage II] [12:Category/Stage II] HBO Classification: [10:Grade 1] [11:Grade 1] [12:Grade 1] Exudate Amount: [10:Medium] [11:Large] [12:Large] Exudate Type: [10:Serous] [11:Serous] [12:Serous] Exudate Color: [10:amber] [11:amber] [12:amber] Wound Margin: Flat and Intact Flat and Intact Flat and Intact Granulation Amount: Large (67-100%) Small (1-33%) Large (67-100%) Granulation Quality: Red Pink Pink Necrotic Amount: None Present (0%) Large (67-100%) Small (1-33%) Necrotic Tissue: N/A Eschar, Adherent Slough Adherent Slough Exposed  Structures: Fascia: No Fascia: No Fascia: No Fat: No Fat: No Fat: No Tendon: No Tendon: No Tendon: No Muscle: No Muscle: No Muscle: No Joint: No Joint: No Joint: No Bone: No Bone: No Bone: No Limited to Skin Limited to Skin Limited to Skin Breakdown Breakdown Breakdown Epithelialization: None None None Periwound Skin Texture: Edema: No Edema: No No Abnormalities Noted Excoriation: No Excoriation: No Induration: No Induration: No Callus: No Callus: No Crepitus: No Crepitus: No Fluctuance: No Fluctuance: No Friable: No Friable: No Rash: No Rash: No Scarring: No Scarring: No Periwound Skin Moist: Yes Maceration: Yes Moist: Yes Moisture: Dry/Scaly: No Moist: Yes Dry/Scaly: No Periwound Skin Color: Erythema: Yes Atrophie Blanche: No No Abnormalities Noted Cyanosis: No Ecchymosis: No Erythema: No Hemosiderin Staining: No Mottled: No Pallor: No Rubor: No Erythema Location: Circumferential N/A N/A Temperature: No Abnormality No Abnormality No Abnormality Tenderness on Yes Yes Yes Palpation: Wound Preparation: Ulcer Cleansing: Ulcer Cleansing: Ulcer Cleansing: Rinsed/Irrigated with Rinsed/Irrigated with Rinsed/Irrigated with Saline Saline Saline Topical Anesthetic Topical Anesthetic Topical Anesthetic Applied: Other: lidocaine Applied: Other: lidocaine Applied: Other: lidocaine 4% 4% 4% Wound Number: [id number]  Photos: No Photos No Photos No Photos Wound Location: Left Calcaneus Back - Midline Right Ischial Tuberosity Wounding Event: Pressure Injury Pressure Injury Pressure Injury [name], [name] ([id number]) Primary Etiology: Pressure Ulcer Pressure Ulcer Pressure Ulcer Comorbid History: Cataracts, Asthma, Cataracts, Asthma, Cataracts, Asthma, Hypertension, Type II Hypertension, Type II Hypertension, Type II Diabetes, Neuropathy, Diabetes, Neuropathy, Diabetes, Neuropathy, Received Chemotherapy Received Chemotherapy Received Chemotherapy Date Acquired:  11/02/2015 05/02/2015 08/01/2015 Weeks of Treatment: 0 27 13 Wound Status: Open Open Open Measurements L x W x D 1.2x1.5x0.1 2.7x2.5x0.1 0.9x0.2x0.1 (cm) Area (cm) : 1.414 5.301 0.141 Volume (cm) : 0.141 0.53 0.014 % Reduction in Area: N/A -2309.50% 95.50% % Reduction in Volume: N/A -2309.10% 95.50% Starting Position 1 11 (o'clock): Ending Position 1 1 (o'clock): Maximum Distance 1 1.9 (cm): Undermining: No Yes No Classification: Category/Stage II Category/Stage II Category/Stage III HBO Classification: Grade 1 N/A N/A Exudate Amount: Large Large Medium Exudate Type: Serous Serosanguineous Serous Exudate Color: amber red, brown amber Wound Margin: Flat and Intact Distinct, outline attached Distinct, outline attached Granulation Amount: Large (67-100%) Small (1-33%) Small (1-33%) Granulation Quality: Red, Pink N/A Pink Necrotic Amount: Small (1-33%) Large (67-100%) Large (67-100%) Necrotic Tissue: Adherent Slough Eschar, Adherent Manning Exposed Structures: Fascia: No Fascia: No Fascia: No Fat: No Fat: No Fat: No Tendon: No Tendon: No Tendon: No Muscle: No Muscle: No Muscle: No Joint: No Joint: No Joint: No Bone: No Bone: No Bone: No Limited to Skin Limited to Skin Limited to Skin Breakdown Breakdown Breakdown Epithelialization: None None None Periwound Skin Texture: No Abnormalities Noted Edema: No Edema: No Excoriation: No Excoriation: No Induration: No Induration: No Callus: No Callus: No Crepitus: No Crepitus: No Fluctuance: No Fluctuance: No Friable: No Friable: No Rash: No Rash: No Scarring: No Scarring: No  Firman, Sage H. ([id number]) Periwound Skin Maceration: Yes Moist: Yes Moist: Yes Moisture: Moist: Yes Maceration: No Maceration: No Dry/Scaly: No Dry/Scaly: No Periwound Skin Color: No Abnormalities Noted Atrophie Blanche: No Atrophie Blanche: No Cyanosis: No Cyanosis: No Ecchymosis: No Ecchymosis:  No Erythema: No Erythema: No Hemosiderin Staining: No Hemosiderin Staining: No Mottled: No Mottled: No Pallor: No Pallor: No Rubor: No Rubor: No Erythema Location: N/A N/A N/A Temperature: No Abnormality No Abnormality No Abnormality Tenderness on Yes No Yes Palpation: Wound Preparation: Ulcer Cleansing: Ulcer Cleansing: Ulcer Cleansing: Rinsed/Irrigated with Rinsed/Irrigated with Rinsed/Irrigated with Saline Saline Saline Topical Anesthetic Topical Anesthetic Topical Anesthetic Applied: Other: lidocaine Applied: Other: lidocaine Applied: Other: lidocaine 4% 4% 4% Wound Number: [id number] N/A Photos: No Photos No Photos N/A Wound Location: Left Malleolus Left Ischial Tuberosity N/A Wounding Event: Gradually Appeared Gradually Appeared N/A Primary Etiology: Pressure Ulcer Pressure Ulcer N/A Comorbid History: Cataracts, Asthma, Cataracts, Asthma, N/A Hypertension, Type II Hypertension, Type II Diabetes, Neuropathy, Diabetes, Neuropathy, Received Chemotherapy Received Chemotherapy Date Acquired: 09/12/2015 09/12/2015 N/A Weeks of Treatment: 7 7 N/A Wound Status: Open Open N/A Measurements L x W x D 1.7x1x0.2 0.9x0.6x0.2 N/A (cm) Area (cm) : 1.335 0.424 N/A Volume (cm) : 0.267 0.085 N/A % Reduction in Area: -2740.40% -116.30% N/A % Reduction in Volume: -2866.70% -325.00% N/A Undermining: No No N/A Classification: Category/Stage II Category/Stage II N/A HBO Classification: Grade 0 N/A N/A Exudate Amount: Medium Medium N/A Exudate Type: Serous Serous N/A Exudate Color: amber amber N/A Wound Margin: Flat and Intact Flat and Intact N/A Granulation Amount: None Present (0%) Medium (34-66%) N/A [name], [name] ([id number]) Granulation Quality: N/A Red, Pink N/A Necrotic Amount: Large (67-100%) Medium (34-66%) N/A Necrotic Tissue: Adherent Slough Eschar, Adherent Slough N/A Exposed Structures: Fascia: No Fascia: No N/A Fat: No Fat: No Tendon: No Tendon: No Muscle:  No Muscle: No Joint: No Joint: No Bone: No Bone: No Limited to Skin Limited to Skin Breakdown Breakdown Epithelialization: None None N/A Periwound Skin Texture: Edema: No Edema: No N/A Excoriation: No Excoriation: No Induration: No Induration: No Callus: No Callus: No Crepitus: No Crepitus: No Fluctuance: No Fluctuance: No Friable: No Friable: No Rash: No Rash: No Scarring: No Scarring: No Periwound Skin Moist: Yes Moist: Yes N/A Moisture: Maceration: No Maceration: No Dry/Scaly: No Dry/Scaly: No Periwound Skin Color: Erythema: Yes Atrophie Blanche: No N/A Atrophie Blanche: No Cyanosis: No Cyanosis: No Ecchymosis: No Ecchymosis: No Erythema: No Hemosiderin Staining: No Hemosiderin Staining: No Mottled: No Mottled: No Pallor: No Pallor: No Rubor: No Rubor: No Erythema Location: Circumferential N/A N/A Temperature: No Abnormality No Abnormality N/A Tenderness on Yes No N/A Palpation: Wound Preparation: Ulcer Cleansing: Ulcer Cleansing: N/A Rinsed/Irrigated with Rinsed/Irrigated with Saline Saline Topical Anesthetic Topical Anesthetic Applied: Other: Lidocaine Applied: Other: lidocaine 4% 4% Treatment Notes Electronic Signature(s) Signed: 11/07/2015 5:53:24 PM By: Alric Quan Entered By: Alric Quan on 11/07/2015 16:12:55 [name], [name] ([id number]) [place] [name], [name] ([id number]) -------------------------------------------------------------------------------- Multi-Disciplinary Care Plan Details Patient Name: [name], Aracelis H. Date of Service: 11/07/2015 3:30 PM Medical Record Number: [id number] Patient Account Number: [id number] Date of Birth/Sex: [birth year] (70 y.o. Female) Treating RN: Carolyne Fiscal, Debi Primary Care Physician: Paulita Cradle Other Clinician: Referring Physician: Paulita Cradle Treating Physician/Extender: Frann Rider in Treatment: 94 Active Inactive Abuse / Safety / Falls / Self Care Management Nursing  Diagnoses: Impaired home maintenance Impaired physical mobility Knowledge deficit related to: safety; personal, health (wound), emergency Potential for falls Self care deficit: actual or potential Goals: Patient will remain injury free  Date Initiated: 03/13/2015 Goal Status: Active Patient/caregiver will verbalize understanding of skin care regimen Date Initiated: 03/13/2015 Goal Status: Active Patient/caregiver will verbalize/demonstrate measure taken to improve self care Date Initiated: 03/13/2015 Goal Status: Active Patient/caregiver will verbalize/demonstrate measures taken to improve the patient's personal safety Date Initiated: 03/13/2015 Goal Status: Active Patient/caregiver will verbalize/demonstrate measures taken to prevent injury and/or falls Date Initiated: 03/13/2015 Goal Status: Active Patient/caregiver will verbalize/demonstrate understanding of what to do in case of emergency Date Initiated: 03/13/2015 Goal Status: Active Interventions: Assess fall risk on admission and as needed Assess: immobility, friction, shearing, incontinence upon admission and as needed Assess impairment of mobility on admission and as needed per policy Assess self care needs on admission and as needed Provide education on basic hygiene Josten, Adely H. ([id number]) Provide education on fall prevention Provide education on personal and home safety Provide education on safe transfers Treatment Activities: Education provided on Basic Hygiene : 03/13/2015 Notes: Orientation to the Wound Care Program Nursing Diagnoses: Knowledge deficit related to the wound healing center program Goals: Patient/caregiver will verbalize understanding of the [facility] Date Initiated: 03/13/2015 Goal Status: Active Interventions: Provide education on orientation to the wound center Notes: Pressure Nursing Diagnoses: Knowledge deficit related to causes and risk factors for pressure ulcer  development Knowledge deficit related to management of pressures ulcers Potential for impaired tissue integrity related to pressure, friction, moisture, and shear Goals: Patient will remain free from development of additional pressure ulcers Date Initiated: 03/13/2015 Goal Status: Active Patient will remain free of pressure ulcers Date Initiated: 03/13/2015 Goal Status: Active Patient/caregiver will verbalize risk factors for pressure ulcer development Date Initiated: 03/13/2015 Goal Status: Active Patient/caregiver will verbalize understanding of pressure ulcer management Date Initiated: 03/13/2015 Goal Status: Active Interventions: Assess: immobility, friction, shearing, incontinence upon admission and as needed Nichelson, Kloi H. ([id number]) Assess offloading mechanisms upon admission and as needed Assess potential for pressure ulcer upon admission and as needed Provide education on pressure ulcers Treatment Activities: Patient referred for home evaluation of offloading devices/mattresses : 11/07/2015 Patient referred for pressure reduction/relief devices : 11/07/2015 Patient referred for seating evaluation to ensure proper offloading : 11/07/2015 Pressure reduction/relief device ordered : 11/07/2015 Test ordered outside of clinic : 11/07/2015 Notes: Wound/Skin Impairment Nursing Diagnoses: Impaired tissue integrity Knowledge deficit related to ulceration/compromised skin integrity Goals: Patient/caregiver will verbalize understanding of skin care regimen Date Initiated: 03/13/2015 Goal Status: Active Ulcer/skin breakdown will have a volume reduction of 30% by week 4 Date Initiated: 03/13/2015 Goal Status: Active Ulcer/skin breakdown will have a volume reduction of 50% by week 8 Date Initiated: 03/13/2015 Goal Status: Active Ulcer/skin breakdown will have a volume reduction of 80% by week 12 Date Initiated: 03/13/2015 Goal Status: Active Ulcer/skin breakdown will heal within 14  weeks Date Initiated: 03/13/2015 Goal Status: Active Interventions: Assess patient/caregiver ability to obtain necessary supplies Assess patient/caregiver ability to perform ulcer/skin care regimen upon admission and as needed Assess ulceration(s) every visit Provide education on smoking Provide education on ulcer and skin care Treatment Activities: Patient referred to home care : 11/07/2015 BRUN, Traci Lemmie Evens ([id number]) Referred to DME Gudelia Eugene for dressing supplies : 11/07/2015 Skin care regimen initiated : 11/07/2015 Topical wound management initiated : 11/07/2015 Notes: Electronic Signature(s) Signed: 11/07/2015 5:53:24 PM By: Alric Quan Entered By: Alric Quan on 11/07/2015 16:12:48 Chiao, Coraleigh H. ([id number]) -------------------------------------------------------------------------------- Pain Assessment Details Patient Name: [name], Aviendha H. Date of Service: 11/07/2015 3:30 PM Medical Record Number: [id number] Patient Account Number: [id number] Date of Birth/Sex: March 28, 1946 (69 y.o.  Female) Treating RN: Ahmed Prima Primary Care Physician: Paulita Cradle Other Clinician: Referring Physician: Paulita Cradle Treating Physician/Extender: Frann Rider in Treatment: 70 Active Problems Location of Pain Severity and Description of Pain Patient Has Paino No Site Locations Pain Management and Medication Current Pain Management: Electronic Signature(s) Signed: 11/07/2015 5:53:24 PM By: Alric Quan Entered By: Alric Quan on 11/07/2015 15:39:35 Kirlin, Shayonna H. ([id number]) -------------------------------------------------------------------------------- Patient/Caregiver Education Details Patient Name: [name], [name] Date of Service: 11/07/2015 3:30 PM Medical Record Number: [id number] Patient Account Number: [id number] Date of Birth/Gender: 11-08-1945 (70 y.o. Female) Treating RN: Carolyne Fiscal, Debi Primary Care Physician: Paulita Cradle Other  Clinician: Referring Physician: Paulita Cradle Treating Physician/Extender: Frann Rider in Treatment: 2 Education Assessment Education Provided To: Patient and Caregiver Education Topics Provided Wound/Skin Impairment: Handouts: Other: change dressings as directed Methods: Demonstration, Explain/Verbal Responses: State content correctly Electronic Signature(s) Signed: 11/07/2015 5:53:24 PM By: Alric Quan Entered By: Alric Quan on 11/07/2015 16:18:34 Blue [name], [name] ([id number]) -------------------------------------------------------------------------------- Wound Assessment Details Patient Name: [name], [name] Date of Service: 11/07/2015 3:30 PM Medical Record Number: [id number] Patient Account Number: [id number] Date of Birth/Sex: 08-29-1946 (70 y.o. Female) Treating RN: Carolyne Fiscal, Debi Primary Care Physician: Paulita Cradle Other Clinician: Referring Physician: Paulita Cradle Treating Physician/Extender: Frann Rider in Treatment: 34 Wound Status Wound Number: 10 Primary Pressure Ulcer Etiology: Wound Location: Right Foot - Medial Wound Open Wounding Event: Gradually Appeared Status: Date Acquired: 09/12/2015 Comorbid Cataracts, Asthma, Hypertension, Type Weeks Of Treatment: 7 History: II Diabetes, Neuropathy, Received Clustered Wound: No Chemotherapy Photos Photo Uploaded By: Alric Quan on 11/07/2015 17:49:48 Wound Measurements Length: (cm) 0.1 Width: (cm) 0.1 Depth: (cm) 0.1 Area: (cm) 0.008 Volume: (cm) 0.001 % Reduction in Area: 96.6% % Reduction in Volume: 95.8% Epithelialization: None Tunneling: No Undermining: No Wound Description Classification: Category/Stage II Foul Odor A Diabetic Severity (Wagner): Grade 1 Wound Margin: Flat and Intact Exudate Amount: Medium Exudate Type: Serous Exudate Color: amber fter Cleansing: No Wound Bed Granulation Amount: Large (67-100%) Exposed Structure Granulation  Quality: Red Fascia Exposed: No Necrotic Amount: None Present (0%) Fat Layer Exposed: No Yepiz, Monesha H. ([id number]) Tendon Exposed: No Muscle Exposed: No Joint Exposed: No Bone Exposed: No Limited to Skin Breakdown Periwound Skin Texture Texture Color No Abnormalities Noted: No No Abnormalities Noted: No Callus: No Erythema: Yes Crepitus: No Erythema Location: Circumferential Excoriation: No Temperature / Pain Fluctuance: No Temperature: No Abnormality Friable: No Tenderness on Palpation: Yes Induration: No Localized Edema: No Rash: No Scarring: No Moisture No Abnormalities Noted: No Dry / Scaly: No Moist: Yes Wound Preparation Ulcer Cleansing: Rinsed/Irrigated with Saline Topical Anesthetic Applied: Other: lidocaine 4%, Treatment Notes Wound #10 (Right, Medial Foot) 1. Cleansed with: Clean wound with Normal Saline 2. Anesthetic Topical Lidocaine 4% cream to wound bed prior to debridement 3. Peri-wound Care: Skin Prep 4. Dressing Applied: Prisma Ag 5. Secondary Dressing Applied Bordered Foam Dressing Electronic Signature(s) Signed: 11/07/2015 5:53:24 PM By: Alric Quan Entered By: Alric Quan on 11/07/2015 15:51:47 Claude, [name] H. ([id number]) -------------------------------------------------------------------------------- Wound Assessment Details Patient Name: Wiers, Tonnia H. Date of Service: 11/07/2015 3:30 PM Medical Record Number: [id number] Patient Account Number: [id number] Date of Birth/Sex: 1946-06-10 (70 y.o. Female) Treating RN: Carolyne Fiscal, Debi Primary Care Physician: Paulita Cradle Other Clinician: Referring Physician: Paulita Cradle Treating Physician/Extender: Frann Rider in Treatment: 34 Wound Status Wound Number: 11 Primary Pressure Ulcer Etiology: Wound Location: Right Calcaneus Wound Open Wounding Event: Pressure Injury Status: Date Acquired: 09/29/2015 Comorbid Cataracts, Asthma, Hypertension, Type Weeks Of  Treatment: 5  History: II Diabetes, Neuropathy, Received Clustered Wound: No Chemotherapy Photos Photo Uploaded By: Alric Quan on 11/07/2015 17:49:48 Wound Measurements Length: (cm) 3.2 Width: (cm) 2.5 Depth: (cm) 0.1 Area: (cm) 6.283 Volume: (cm) 0.628 % Reduction in Area: -788.7% % Reduction in Volume: -784.5% Epithelialization: None Undermining: Yes Starting Position (o'clock): 5 Ending Position (o'clock): 1 Maximum Distance: (cm) 2.5 Wound Description Classification: Category/Stage II Diabetic Severity Earleen Newport): Grade 1 Wound Margin: Flat and Intact Exudate Amount: Large Exudate Type: Serous Exudate Color: amber Foul Odor After Cleansing: No Wound Bed Alatorre, Wileen H. ([id number]) Granulation Amount: Small (1-33%) Exposed Structure Granulation Quality: Pink Fascia Exposed: No Necrotic Amount: Large (67-100%) Fat Layer Exposed: No Necrotic Quality: Eschar, Adherent Slough Tendon Exposed: No Muscle Exposed: No Joint Exposed: No Bone Exposed: No Limited to Skin Breakdown Periwound Skin Texture Texture Color No Abnormalities Noted: No No Abnormalities Noted: No Callus: No Atrophie Blanche: No Crepitus: No Cyanosis: No Excoriation: No Ecchymosis: No Fluctuance: No Erythema: No Friable: No Hemosiderin Staining: No Induration: No Mottled: No Localized Edema: No Pallor: No Rash: No Rubor: No Scarring: No Temperature / Pain Moisture Temperature: No Abnormality No Abnormalities Noted: No Tenderness on Palpation: Yes Dry / Scaly: No Maceration: Yes Moist: Yes Wound Preparation Ulcer Cleansing: Rinsed/Irrigated with Saline Topical Anesthetic Applied: Other: lidocaine 4%, Electronic Signature(s) Signed: 11/07/2015 5:53:24 PM By: Alric Quan Entered By: Alric Quan on 11/07/2015 15:55:35 Cavallaro, Aracely H. ([id number]) -------------------------------------------------------------------------------- Wound Assessment Details Patient Name:  Nealis, Yenni H. Date of Service: 11/07/2015 3:30 PM Medical Record Number: [id number] Patient Account Number: [id number] Date of Birth/Sex: 1945-12-24 (70 y.o. 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 - Proximal Wound Open Wounding Event: Pressure Injury Status: Date Acquired: 11/07/2015 Comorbid Cataracts, Asthma, Hypertension, Type Weeks Of Treatment: 0 History: II Diabetes, Neuropathy, Received Clustered Wound: No Chemotherapy Photos Photo Uploaded By: Alric Quan on 11/07/2015 17:50:08 Wound Measurements Length: (cm) 0.8 Width: (cm) 0.9 Depth: (cm) 0.1 Area: (cm) 0.565 Volume: (cm) 0.057 % Reduction in Area: % Reduction in Volume: Epithelialization: None Tunneling: No Undermining: No Wound Description Classification: Category/Stage II Foul Odor Af Diabetic Severity (Wagner): Grade 1 Wound Margin: Flat and Intact Exudate Amount: Large Exudate Type: Serous Exudate Color: amber ter Cleansing: No Wound Bed Granulation Amount: Large (67-100%) Exposed Structure Granulation Quality: Pink Fascia Exposed: No Necrotic Amount: Small (1-33%) Fat Layer Exposed: No Steiner, Emmilee H. ([id number]) Necrotic Quality: Adherent Slough Tendon Exposed: No Muscle Exposed: No Joint Exposed: No Bone Exposed: No Limited to Skin Breakdown Periwound Skin Texture Texture Color No Abnormalities Noted: No No Abnormalities Noted: No Moisture Temperature / Pain No Abnormalities Noted: No Temperature: No Abnormality Moist: Yes Tenderness on Palpation: Yes Wound Preparation Ulcer Cleansing: Rinsed/Irrigated with Saline Topical Anesthetic Applied: Other: lidocaine 4%, Treatment Notes Wound #12 (Right, Proximal  Calcaneus) 1. Cleansed with: Clean wound with Normal Saline 2. Anesthetic Topical Lidocaine 4% cream to wound bed prior to debridement 3. Peri-wound Care: Skin Prep 4. Dressing Applied: Prisma Ag 5. Secondary Dressing Applied Bordered Foam Dressing Electronic Signature(s) Signed: 11/07/2015 5:53:24 PM By: Alric Quan Entered By: Alric Quan on 11/07/2015 15:58:59 Steve, Arion H. ([id number]) -------------------------------------------------------------------------------- Wound Assessment Details Patient Name: Weyrauch, Kiaya H. Date of Service: 11/07/2015 3:30 PM Medical Record Number: [id number] Patient Account Number: [id number] Date of Birth/Sex: 08/06/1946 (70 y.o. Female) Treating RN: Ahmed Prima Primary Care  Physician: Paulita Cradle Other Clinician: Referring Physician: Paulita Cradle Treating Physician/Extender: Frann Rider in Treatment: 34 Wound Status Wound Number: 13 Primary Pressure Ulcer Etiology: Wound Location: Left Calcaneus Wound Open Wounding Event: Pressure Injury Status: Date Acquired: 11/02/2015 Comorbid Cataracts, Asthma, Hypertension, Type Weeks Of Treatment: 0 History: II Diabetes, Neuropathy, Received Clustered Wound: No Chemotherapy Photos Photo Uploaded By: Alric Quan on 11/07/2015 17:50:08 Wound Measurements Length: (cm) 1.2 Width: (cm) 1.5 Depth: (cm) 0.1 Area: (cm) 1.414 Volume: (cm) 0.141 % Reduction in Area: % Reduction in Volume: Epithelialization: None Tunneling: No Undermining: No Wound Description Classification: Category/Stage II Diabetic Severity Earleen Newport): Grade 1 Wound Margin: Flat and Intact Exudate Amount: Large Exudate Type: Serous Exudate Color: amber Wound Bed Granulation Amount: Large (67-100%) Exposed Structure Granulation Quality: Red, Pink Fascia Exposed: No Necrotic Amount: Small (1-33%) Fat Layer Exposed: No Lufkin, Emaya H. ([id number]) Necrotic Quality: Adherent  Slough Tendon Exposed: No Muscle Exposed: No Joint Exposed: No Bone Exposed: No Limited to Skin Breakdown Periwound Skin Texture Texture Color No Abnormalities Noted: No No Abnormalities Noted: No Moisture Temperature / Pain No Abnormalities Noted: No Temperature: No Abnormality Maceration: Yes Tenderness on Palpation: Yes Moist: Yes Wound Preparation Ulcer Cleansing: Rinsed/Irrigated with Saline Topical Anesthetic Applied: Other: lidocaine 4%, Treatment Notes Wound #13 (Left Calcaneus) 1. Cleansed with: Clean wound with Normal Saline 2. Anesthetic Topical Lidocaine 4% cream to wound bed prior to debridement 3. Peri-wound Care: Skin Prep 4. Dressing Applied: Prisma Ag 5. Secondary Dressing Applied Bordered Foam Dressing Electronic Signature(s) Signed: 11/07/2015 5:53:24 PM By: Alric Quan Entered By: Alric Quan on 11/07/2015 16:01:20 Reames, Burnadette H. ([id number]) -------------------------------------------------------------------------------- Wound Assessment Details Patient Name: Scovill, Jaimie H. Date of Service: 11/07/2015 3:30 PM Medical Record Number: [id number] Patient Account Number: [id number] Date of Birth/Sex: Jan 06, 1946 (70 y.o. Female) Treating RN: Carolyne Fiscal, Debi Primary Care Physician: Paulita Cradle Other Clinician: Referring Physician: Paulita Cradle Treating Physician/Extender: Frann Rider in Treatment: 34 Wound Status Wound Number: 3 Primary Pressure Ulcer Etiology: Wound Location: Back - Midline Wound Open Wounding Event: Pressure Injury Status: Date Acquired: 05/02/2015 Comorbid Cataracts, Asthma, Hypertension, Type Weeks Of Treatment: 27 History: II Diabetes, Neuropathy, Received Clustered Wound: No Chemotherapy Photos Photo Uploaded By: Alric Quan on 11/07/2015 17:50:30 Wound Measurements Length: (cm) 2.7 Width: (cm) 2.5 Depth: (cm) 0.1 Area: (cm) 5.301 Volume: (cm) 0.53 % Reduction in Area:  -2309.5% % Reduction in Volume: -2309.1% Epithelialization: None Tunneling: No Undermining: Yes Starting Position (o'clock): 11 Ending Position (o'clock): 1 Maximum Distance: (cm) 1.9 Wound Description Classification: Category/Stage II Wound Margin: Distinct, outline attached Exudate Amount: Large Exudate Type: Serosanguineous Exudate Color: red, brown Foul Odor After Cleansing: No Wound Bed Pulcini, Madalynne H. ([id number]) Granulation Amount: Small (1-33%) Exposed Structure Necrotic Amount: Large (67-100%) Fascia Exposed: No Necrotic Quality: Eschar, Adherent Slough Fat Layer Exposed: No Tendon Exposed: No Muscle Exposed: No Joint Exposed: No Bone Exposed: No Limited to Skin Breakdown Periwound Skin Texture Texture Color No Abnormalities Noted: No No Abnormalities Noted: No Callus: No Atrophie Blanche: No Crepitus: No Cyanosis: No Excoriation: No Ecchymosis: No Fluctuance: No Erythema: No Friable: No Hemosiderin Staining: No Induration: No Mottled: No Localized Edema: No Pallor: No Rash: No Rubor: No Scarring: No Temperature / Pain Moisture Temperature: No Abnormality No Abnormalities Noted: No Dry / Scaly: No Maceration: No Moist: Yes Wound Preparation Ulcer Cleansing: Rinsed/Irrigated with Saline Topical Anesthetic Applied: Other: lidocaine 4%, Treatment Notes Wound #3 (Midline Back) 1. Cleansed with: Clean wound with Dakins/Anasept 2. Anesthetic Topical Lidocaine 4% cream to wound  bed prior to debridement 3. Peri-wound Care: Skin Prep 4. Dressing Applied: Santyl Ointment 5. Secondary Dressing Applied Bordered Foam Dressing Electronic Signature(s) Signed: 11/07/2015 5:53:24 PM By: Laurell Roof, Tacey H. ([id number]) Entered By: Alric Quan on 11/07/2015 16:12:38 Dulude, Lashala H. ([id number]) -------------------------------------------------------------------------------- Wound Assessment Details Patient Name: Zinda, Cintia H. Date  of Service: 11/07/2015 3:30 PM Medical Record Number: [id number] Patient Account Number: [id number] Date of Birth/Sex: 09-Jul-1946 (70 y.o. 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 Wound Open Wounding Event: Pressure Injury Status: Date Acquired: 08/01/2015 Comorbid Cataracts, Asthma, Hypertension, Type Weeks Of Treatment: 13 History: II Diabetes, Neuropathy, Received Clustered Wound: No Chemotherapy Photos Photo Uploaded By: Alric Quan on 11/07/2015 17:50:58 Wound Measurements Length: (cm) 0.9 Width: (cm) 0.2 Depth: (cm) 0.1 Area: (cm) 0.141 Volume: (cm) 0.014 % Reduction in Area: 95.5% % Reduction in Volume: 95.5% Epithelialization: None Tunneling: No Undermining: No Wound Description Classification: Category/Stage III Wound Margin: Distinct, outline attached Exudate Amount: Medium Exudate Type: Serous Exudate Color: amber Foul Odor After Cleansing: No Wound Bed Granulation Amount: Small (1-33%) Exposed Structure Granulation Quality: Pink Fascia Exposed: No Necrotic Amount: Large (67-100%) Fat Layer Exposed: No Necrotic Quality: Adherent Slough Tendon Exposed: No Harter, Jeslynn H. ([id number]) Muscle Exposed: No Joint Exposed: No Bone Exposed: No Limited to Skin Breakdown Periwound Skin Texture Texture Color No Abnormalities Noted: No No Abnormalities Noted: No Callus: No Atrophie Blanche: No Crepitus: No Cyanosis: No Excoriation: No Ecchymosis: No Fluctuance: No Erythema: No Friable: No Hemosiderin Staining: No Induration: No Mottled: No Localized Edema: No Pallor: No Rash: No Rubor: No Scarring: No Temperature / Pain Moisture Temperature: No Abnormality No Abnormalities  Noted: No Tenderness on Palpation: Yes Dry / Scaly: No Maceration: No Moist: Yes Wound Preparation Ulcer Cleansing: Rinsed/Irrigated with Saline Topical Anesthetic Applied: Other: lidocaine 4%, Treatment Notes Wound #7 (Right Ischial Tuberosity) 1. Cleansed with: Clean wound with Normal Saline 2. Anesthetic Topical Lidocaine 4% cream to wound bed prior to debridement 3. Peri-wound Care: Skin Prep 4. Dressing Applied: Prisma Ag 5. Secondary Dressing Applied Bordered Foam Dressing Electronic Signature(s) Signed: 11/07/2015 5:53:24 PM By: Alric Quan Entered By: Alric Quan on 11/07/2015 16:08:02 Aneth, [place]. ([id number]) -------------------------------------------------------------------------------- Wound Assessment Details Patient Name: Genson, Noelene H. Date of Service: 11/07/2015 3:30 PM Medical Record Number: [id number] Patient Account Number: [id number] Date of Birth/Sex: 08-Dec-1945 (70 y.o. Female) Treating RN: Carolyne Fiscal, Debi Primary Care Physician: Paulita Cradle Other Clinician: Referring Physician: Paulita Cradle Treating Physician/Extender: Frann Rider in Treatment: 34 Wound Status Wound Number: 8 Primary Pressure Ulcer Etiology: Wound Location: Left Malleolus Wound Open Wounding Event: Gradually Appeared Status: Date Acquired: 09/12/2015 Comorbid Cataracts, Asthma, Hypertension, Type Weeks Of Treatment: 7 History: II Diabetes, Neuropathy, Received Clustered Wound: No Chemotherapy Photos Photo Uploaded By: Alric Quan on 11/07/2015 17:50:58 Wound Measurements Length: (cm) 1.7 Width: (cm) 1 Depth: (cm) 0.2 Area: (cm) 1.335 Volume: (cm) 0.267 % Reduction in Area: -2740.4% % Reduction in Volume: -2866.7% Epithelialization: None Tunneling: No Undermining: No Wound Description Classification: Category/Stage II Foul Odor Diabetic Severity Earleen Newport): Grade 0 Wound Margin: Flat and Intact Exudate Amount:  Medium Exudate Type: Serous Exudate Color: amber After Cleansing: No Wound Bed Granulation Amount: None Present (0%) Exposed Structure Necrotic Amount: Large (67-100%) Fascia Exposed: No Necrotic Quality: Adherent Slough Fat Layer Exposed: No Orama, Liller H. ([id number]) Tendon Exposed:  No Muscle Exposed: No Joint Exposed: No Bone Exposed: No Limited to Skin Breakdown Periwound Skin Texture Texture Color No Abnormalities Noted: No No Abnormalities Noted: No Callus: No Atrophie Blanche: No Crepitus: No Cyanosis: No Excoriation: No Ecchymosis: No Fluctuance: No Erythema: Yes Friable: No Erythema Location: Circumferential Induration: No Hemosiderin Staining: No Localized Edema: No Mottled: No Rash: No Pallor: No Scarring: No Rubor: No Moisture Temperature / Pain No Abnormalities Noted: No Temperature: No Abnormality Dry / Scaly: No Tenderness on Palpation: Yes Maceration: No Moist: Yes Wound Preparation Ulcer Cleansing: Rinsed/Irrigated with Saline Topical Anesthetic Applied: Other: Lidocaine 4%, Treatment Notes Wound #8 (Left Malleolus) 1. Cleansed with: Clean wound with Dakins/Anasept 2. Anesthetic Topical Lidocaine 4% cream to wound bed prior to debridement 3. Peri-wound Care: Skin Prep 4. Dressing Applied: Santyl Ointment 5. Secondary Dressing Applied Bordered Foam Dressing Electronic Signature(s) Signed: 11/07/2015 5:53:24 PM By: Alric Quan Entered By: Alric Quan on 11/07/2015 15:50:40 Salaam, Tyiesha H. ([id number]) -------------------------------------------------------------------------------- Wound Assessment Details Patient Name: Titzer, Saralynn H. Date of Service: 11/07/2015 3:30 PM Medical Record Number: [id number] Patient Account Number: [id number] Date of Birth/Sex: 19-Sep-1945 (70 y.o. Female) Treating RN: Carolyne Fiscal, Debi Primary Care Physician: Paulita Cradle Other Clinician: Referring Physician: Paulita Cradle Treating  Physician/Extender: Frann Rider in Treatment: 34 Wound Status Wound Number: 9 Primary Pressure Ulcer Etiology: Wound Location: Left Ischial Tuberosity Wound Open Wounding Event: Gradually Appeared Status: Date Acquired: 09/12/2015 Comorbid Cataracts, Asthma, Hypertension, Type Weeks Of Treatment: 7 History: II Diabetes, Neuropathy, Received Clustered Wound: No Chemotherapy Photos Photo Uploaded By: Alric Quan on 11/07/2015 17:51:58 Wound Measurements Length: (cm) 0.9 Width: (cm) 0.6 Depth: (cm) 0.2 Area: (cm) 0.424 Volume: (cm) 0.085 % Reduction in Area: -116.3% % Reduction in Volume: -325% Epithelialization: None Tunneling: No Undermining: No Wound Description Classification: Category/Stage II Wound Margin: Flat and Intact Exudate Amount: Medium Exudate Type: Serous Exudate Color: amber Foul Odor After Cleansing: No Wound Bed Granulation Amount: Medium (34-66%) Exposed Structure Granulation Quality: Red, Pink Fascia Exposed: No Necrotic Amount: Medium (34-66%) Fat Layer Exposed: No Necrotic Quality: Eschar, Adherent Slough Tendon Exposed: No Gaal, Bianka H. ([id number]) Muscle Exposed: No Joint Exposed: No Bone Exposed: No Limited to Skin Breakdown Periwound Skin Texture Texture Color No Abnormalities Noted: No No Abnormalities Noted: No Callus: No Atrophie Blanche: No Crepitus: No Cyanosis: No Excoriation: No Ecchymosis: No Fluctuance: No Erythema: No Friable: No Hemosiderin Staining: No Induration: No Mottled: No Localized Edema: No Pallor: No Rash: No Rubor: No Scarring: No Temperature / Pain Moisture Temperature: No Abnormality No Abnormalities Noted: No Dry / Scaly: No Maceration: No Moist: Yes Wound Preparation Ulcer Cleansing: Rinsed/Irrigated with Saline Topical Anesthetic Applied: Other: lidocaine 4%, Treatment Notes Wound #9 (Left Ischial Tuberosity) 1. Cleansed with: Clean wound with Normal Saline 2.  Anesthetic Topical Lidocaine 4% cream to wound bed prior to debridement 3. Peri-wound Care: Skin Prep 4. Dressing Applied: Prisma Ag 5. Secondary Dressing Applied Bordered Foam Dressing Electronic Signature(s) Signed: 11/07/2015 5:53:24 PM By: Alric Quan Entered By: Alric Quan on 11/07/2015 16:06:32 Rosten, Zahara H. ([id number]) -------------------------------------------------------------------------------- Vitals Details Patient Name: [name], Lakysha H. Date of Service: 11/07/2015 3:30 PM Medical Record Number: [id number] Patient Account Number: [id number] Date of Birth/Sex: 12-28-1945 (69 y.o. Female) Treating RN: Carolyne Fiscal, Debi Primary Care Physician: Paulita Cradle Other Clinician: Referring Physician: Paulita Cradle Treating Physician/Extender: Frann Rider in Treatment: 34 Vital Signs Time Taken: 15:39 Temperature (F): 98.1 Height (in): 65 Pulse (bpm): 115 Weight (lbs): 100 Respiratory Rate (breaths/min): 20 Body Mass Index (BMI): 16.6  Blood Pressure (mmHg): 95/60 Reference Range: 80 - 120 mg / dl Electronic Signature(s) Signed: 11/07/2015 5:53:24 PM By: Alric Quan Entered By: Alric Quan on 11/07/2015 15:43:53

## 2015-11-08 NOTE — Progress Notes (Signed)
[name], [name] ([id number]) Visit Report for 11/07/2015 Chief Complaint Document Details Patient Name: [name], [name] Date of Service: 11/07/2015 3:30 PM Medical Record Number: [id number] Patient Account Number: [id number] Date of Birth/Sex: 05-12-1946 (70 y.o. Female) Treating RN: Ahmed Prima Primary Care Physician: Paulita Cradle Other Clinician: Referring Physician: Paulita Cradle Treating Physician/Extender: Frann Rider in Treatment: 60 Information Obtained from: Patient Chief Complaint Patient presents to the wound care center for a consult due non healing wound. Ulcers on the right elbow and the right heel for about 1 month. Electronic Signature(s) Signed: 11/07/2015 4:52:17 PM By: Christin Fudge MD, FACS Entered By: Christin Fudge on 11/07/2015 16:52:17 [name], [name] ([id number]) -------------------------------------------------------------------------------- Debridement Details Patient Name: [name], [name] Date of Service: 11/07/2015 3:30 PM Medical Record Number: [id number] Patient Account Number: [id number] Date of Birth/Sex: 06-03-1946 (71 y.o. Female) Treating RN: Carolyne Fiscal, Debi Primary Care Physician: Paulita Cradle Other Clinician: Referring Physician: Paulita Cradle Treating Physician/Extender: Frann Rider in Treatment: 34 Debridement Performed for Wound #11 Right,Distal Calcaneus Assessment: Performed By: Physician Christin Fudge, MD Debridement: Debridement Pre-procedure Yes Verification/Time Out Taken: Start Time: 16:28 Pain Control: Other : lidocaine 4% cream Level: Skin/Subcutaneous Tissue Total Area Debrided (L x 3.2 (cm) x 2.5 (cm) = 8 (cm) W): Tissue and other Viable, Non-Viable material debrided: Instrument: Forceps, Scissors Bleeding: None End Time: 16:38 Procedural Pain: 0 Post Procedural Pain: 0 Response to Treatment: Procedure was tolerated well Post Debridement Measurements of Total Wound Length: (cm)  3.2 Stage: Category/Stage II Width: (cm) 2.5 Depth: (cm) 0.1 Volume: (cm) 0.628 Post Procedure Diagnosis Same as Pre-procedure Electronic Signature(s) Signed: 11/07/2015 4:51:38 PM By: Christin Fudge MD, FACS Signed: 11/07/2015 5:53:24 PM By: Alric Quan Entered By: Christin Fudge on 11/07/2015 16:51:38 [name], [name] ([id number]) -------------------------------------------------------------------------------- Debridement Details Patient Name: [name], [name] Date of Service: 11/07/2015 3:30 PM Medical Record Number: [id number] Patient Account Number: [id number] Date of Birth/Sex: 29-May-1946 (70 y.o. Female) Treating RN: Carolyne Fiscal, Debi Primary Care Physician: Paulita Cradle Other Clinician: Referring Physician: Paulita Cradle Treating Physician/Extender: Frann Rider in Treatment: 34 Debridement Performed for Wound #3 Midline Back Assessment: Performed By: Physician Christin Fudge, MD Debridement: Debridement Pre-procedure Yes Verification/Time Out Taken: Start Time: 16:28 Pain Control: Other : lidocaine 4% cream Level: Skin/Subcutaneous Tissue Total Area Debrided (L x 2.7 (cm) x 2.5 (cm) = 6.75 (cm) W): Tissue and other Viable, Non-Viable material debrided: Instrument: Forceps, Scissors Bleeding: None End Time: 16:41 Procedural Pain: 0 Post Procedural Pain: 0 Response to Treatment: Procedure was tolerated well Post Debridement Measurements of Total Wound Length: (cm) 2.7 Stage: Category/Stage II Width: (cm) 2.5 Depth: (cm) 0.2 Volume: (cm) 1.06 Post Procedure Diagnosis Same as Pre-procedure Electronic Signature(s) Signed: 11/07/2015 4:51:47 PM By: Christin Fudge MD, FACS Signed: 11/07/2015 5:53:24 PM By: Alric Quan Entered By: Christin Fudge on 11/07/2015 16:51:47 [name], [name] ([id number]) -------------------------------------------------------------------------------- Debridement Details Patient Name: [name], [name] Date of  Service: 11/07/2015 3:30 PM Medical Record Number: [id number] Patient Account Number: [id number] Date of Birth/Sex: 06-20-1946 (70 y.o. Female) Treating RN: Carolyne Fiscal, Debi Primary Care Physician: Paulita Cradle Other Clinician: Referring Physician: Paulita Cradle Treating Physician/Extender: Frann Rider in Treatment: 34 Debridement Performed for Wound #7 Right Ischial Tuberosity Assessment: Performed By: Physician Christin Fudge, MD Debridement: Debridement Pre-procedure Yes Verification/Time Out Taken: Start Time: 16:28 Pain Control: Other : lidocaine 4% cream Level: Skin/Subcutaneous Tissue Total Area Debrided (L x 0.9 (cm) x 0.2 (cm) = 0.18 (cm) W): Tissue and other Viable, Non-Viable material debrided: Instrument: Forceps, Scissors Bleeding:  None End Time: 16:38 Procedural Pain: 0 Post Procedural Pain: 0 Response to Treatment: Procedure was tolerated well Post Debridement Measurements of Total Wound Length: (cm) 0.9 Stage: Category/Stage III Width: (cm) 0.2 Depth: (cm) 0.2 Volume: (cm) 0.028 Post Procedure Diagnosis Same as Pre-procedure Electronic Signature(s) Signed: 11/07/2015 4:51:57 PM By: Christin Fudge MD, FACS Signed: 11/07/2015 5:53:24 PM By: Alric Quan Entered By: Christin Fudge on 11/07/2015 16:51:57 [name], [name] ([id number]) -------------------------------------------------------------------------------- Debridement Details Patient Name: [name], [name] Date of Service: 11/07/2015 3:30 PM Medical Record Number: [id number] Patient Account Number: [id number] Date of Birth/Sex: May 31, 1946 (70 y.o. Female) Treating RN: Carolyne Fiscal, Debi Primary Care Physician: Paulita Cradle Other Clinician: Referring Physician: Paulita Cradle Treating Physician/Extender: Frann Rider in Treatment: 34 Debridement Performed for Wound #8 Left Malleolus Assessment: Performed By: Physician Christin Fudge, MD Debridement:  Debridement Pre-procedure Yes Verification/Time Out Taken: Start Time: 16:28 Pain Control: Other : lidocaine 4% cream Level: Skin/Subcutaneous Tissue Total Area Debrided (L x 1.7 (cm) x 1 (cm) = 1.7 (cm) W): Tissue and other Viable, Non-Viable material debrided: Instrument: Forceps Bleeding: None End Time: 16:38 Procedural Pain: 0 Post Procedural Pain: 0 Response to Treatment: Procedure was tolerated well Post Debridement Measurements of Total Wound Length: (cm) 1.7 Stage: Category/Stage II Width: (cm) 1 Depth: (cm) 0.2 Volume: (cm) 0.267 Post Procedure Diagnosis Same as Pre-procedure Electronic Signature(s) Signed: 11/07/2015 4:52:05 PM By: Christin Fudge MD, FACS Signed: 11/07/2015 5:53:24 PM By: Alric Quan Entered By: Christin Fudge on 11/07/2015 16:52:05 [name], [name] ([id number]) -------------------------------------------------------------------------------- HPI Details Patient Name: Morimoto, Breeona H. Date of Service: 11/07/2015 3:30 PM Medical Record Number: [id number] Patient Account Number: [id number] Date of Birth/Sex: March 22, 1946 (70 y.o. Female) Treating RN: Carolyne Fiscal, Debi Primary Care Physician: Paulita Cradle Other Clinician: Referring Physician: Paulita Cradle Treating Physician/Extender: Frann Rider in Treatment: 34 History of Present Illness Location: Ulceration on the right heel and the right elbow. Quality: Patient reports experiencing a dull pain to affected area(s). Severity: Patient states wound (s) are getting better. Duration: Patient has had the wound for < 4 weeks prior to presenting for treatment Timing: Pain in wound is Intermittent (comes and goes Context: The wound appeared gradually over time Modifying Factors: Consults to this date include:Augmentin and Bactrim and also some heel protection with duoderm Associated Signs and Symptoms: Patient reports having difficulty standing for long periods. HPI Description:  70 year old female with history of peripheral neuropathy, history of diet controlled diabetes mellitus type 2, history of alcoholism here for wound consult sent by her PCP Dr. Sherilyn Cooter. She has pressure ulcers at her right elbow, bilateral heels. Plain films of right calcaneus without acute bony process. Patient started by PCP on Augmentin, Bactrim as per orders, DuoDerm dressings applied - reports some improvement in her ulcer since last seen. Denies fever, chills, nausea, vomiting, diarrhea. She had a right humerus fracture in the middle of May and has had no surgery and arm is in a sling. She is also been laying in the bed for quite a while. Past medical history significant for essential hypertension, osteoporosis, peripheral neuropathy, alcoholism, ataxia, personal history of breast cancer treated with surgery chemotherapy and radiation and this was done in December 2010. she is also status post laparoscopic cholecystectomy, pilonidal cyst excision, subcutaneous port placement, partial mastectomy on the left side, skin cancer removal. 03/21/2015 -- she says overall she's been doing better and continues to smoke about 15 cigarettes a day. 03/21/2015 - her orthopedic doctor has said she may require surgery for her right humerus  fracture. 04/04/2015 -- her orthopedic surgery has been scheduled for August 11. 04/18/2015 -- she is doing fine as far as her elbow and her right heel goes but she has developed some redness over prominence on her thoracic spine and wanted me to take a look at this. 05/02/2015 -- she had her surgery done and now is in a sling and support. Her back has developed a pressure injury of undetermined stage. She seems to be in better spirits. 05/16/2015 -- last week her right heel was looking great and we had healed it out, but she has not been offloading appropriately and has a deep tissue injury on the right heel again. The area on her right elbow has opened out with slough and  the area in the thoracic spine is also getting worse. 05/30/2015 -- she has developed 2 new ulcerations one on her left ischial tuberosity and one on the sacral region. She is still working on getting up smoking but is also unable to take her vitamins as she says she develops a diarrhea when she takes vitamins. She has increased her intake of proteins. 06/06/2015 -- the patient's husband manages to get her a low air loss mattress with initiating pressure but Rawe, Catheryn H. ([id number]) did not know how to use it exactly and the patient was not happy about using it. Overall she says she's been feeling better. 07/11/2015 -- . Discussed a surgical opinion for debridement and application of a wound VAC, 2 weeks ago but the patient has been reluctant to get a surgical opinion as she wants to avoid surgery. 07/18/2015 -- they have an appointment to see Dr. Tamala Julian this coming Wednesday. 07/29/2015 -- they saw Dr. Tamala Julian in his office and he did a debridement of the wound and removed significant amount of slough. This is in addition to the debridement I had done previously on Friday where a large amount of the eschar was removed. He did not recommend the application of wound VAC. Addendum : Dr. Thompson Caul note was reviewed via EPIC and details noted as above. 08/05/2015 -- over the last week she has developed a another pressure injury to her right hip and has had significant discoloration of the skin and an eschar there. 08/15/2015 -- she is still smoking about a pack of cigarettes a day and does not seem to want to quit. They have not been able to talk to the vendor regarding the air mattress and I will ask them to get in touch again. She is reluctant to take vitamins and does admit her nutrition is poor. 08/22/2015 -- she has been unable to tolerate her vitamins and continues to smoke. They're working on getting a low air loss mattress and have been speaking with the vendor. 09/19/2015 -- since her last  visit and was admitted to the hospital between 09/09/2015 and 09/16/2015. She was thought to have an active sepsis possibly from one of her decubitus ulcers but nothing was grown except for an MSSA from her thoracic spine region. CT of this area did not show any osteomyelitis. Been given IV antibiotics which included vancomycin and Zosyn in the hospital under the care of Dr. [name] the ID specialist she was sent home on oral Keflex 500 mg 3 times a day for 2 weeks. he will consider an MRI in the outpatient setting to completely rule out osteomyelitis of the spine. 10/03/2015 --readmitted to hospital on 09/23/2015 for general debility, lethargy and possible sepsis and workup was in progress. Seen by Dr.  Fitzgerald and he would also like a workup for underlying malignancy as sheos had previous ultrasounds of the breasts suggesting findings of concern. Workup done so far does not suggest deep bony infection. She was treated for a pneumonia and received Zosyn and vancomycin. She had been recommended Cipro 500 twice a day and doxycycline 100 mg twice a day once she was to be [place] home. The antibiotics were to be stopped on 10/07/2015. 10/17/2015 -- patient is feeling much better and has been eating better and doing her offloading as much as possible. 10/23/2015 -- she has an appointment to see Dr. [name] tomorrow but other than that has been doing as much as possible with offloading and increasing her diet. 11/07/2015 -- she has not been here to see [place] for 2 weeks and at the present time continues to try and eat better, work on off loading and is using the air mattress at night. She saw her PCP yesterday and she has put her back on a fentanyl patch. Electronic Signature(s) Signed: 11/07/2015 4:53:00 PM By: Christin Fudge MD, FACS Entered By: Christin Fudge on 11/07/2015 16:53:00 [name], [name]  ([id number]) -------------------------------------------------------------------------------- Physical Exam Details Patient Name: [name], [name] Date of Service: 11/07/2015 3:30 PM Medical Record Number: [id number] Patient Account Number: [id number] Date of Birth/Sex: 1946/05/23 (70 y.o. Female) Treating RN: Ahmed Prima Primary Care Physician: Paulita Cradle Other Clinician: Referring Physician: Paulita Cradle Treating Physician/Extender: Frann Rider in Treatment: 34 Constitutional . Pulse regular. Respirations normal and unlabored. Afebrile. . Eyes Nonicteric. Reactive to light. Ears, Nose, Mouth, and Throat Lips, teeth, and gums WNL.Marland Kitchen Moist mucosa without lesions. Neck supple and nontender. No palpable supraclavicular or cervical adenopathy. Normal sized without goiter. Respiratory WNL. No retractions.. Cardiovascular Pedal Pulses WNL. No clubbing, cyanosis or edema. Chest Breasts symmetical and no nipple discharge.. Breast tissue WNL, no masses, lumps, or tenderness.. Lymphatic No adneopathy. No adenopathy. No adenopathy. Musculoskeletal Adexa without tenderness or enlargement.. Digits and nails w/o clubbing, cyanosis, infection, petechiae, ischemia, or inflammatory conditions.. Integumentary (Hair, Skin) No suspicious lesions. No crepitus or fluctuance. No peri-wound warmth or erythema. No masses.Marland Kitchen Psychiatric Judgement and insight Intact.. No evidence of depression, anxiety, or agitation.. Notes after reviewing each of the wounds serially some of them required sharp debridement with forceps and scissors and this has been down to the subcutaneous tissue and bleeding controlled with pressure. Several of the wounds had good granulation tissue and these were just cleansed with saline. Electronic Signature(s) Signed: 11/07/2015 4:54:13 PM By: Christin Fudge MD, FACS Entered By: Christin Fudge on 11/07/2015 16:54:13 Hou, Paulena H.  ([id number]) -------------------------------------------------------------------------------- Physician Orders Details Patient Name: [name], Kenna H. Date of Service: 11/07/2015 3:30 PM Medical Record Number: [id number] Patient Account Number: [id number] Date of Birth/Sex: 03-30-1946 (70 y.o. Female) Treating RN: Carolyne Fiscal, Debi Primary Care Physician: Paulita Cradle Other Clinician: Referring Physician: Paulita Cradle Treating Physician/Extender: Frann Rider in Treatment: 64 Verbal / Phone Orders: Yes Clinician: Carolyne Fiscal, Debi Read Back and Verified: Yes Diagnosis Coding Wound Cleansing Wound #10 Right,Medial Foot o Clean wound with Normal Saline. Wound #11 Right,Distal Calcaneus o Clean wound with Normal Saline. Wound #12 Right,Proximal Calcaneus o Clean wound with Normal Saline. Wound #13 Left Calcaneus o Clean wound with Normal Saline. Wound #3 Midline Back o Clean wound with Normal Saline. Wound #7 Right Ischial Tuberosity o Clean wound with Normal Saline. Wound #8 Left Malleolus o Clean wound with Normal Saline. Wound #9 Left Ischial Tuberosity o Clean wound with Normal Saline. Anesthetic Wound #10  Right,Medial Foot o Topical Lidocaine 4% cream applied to wound bed prior to debridement Wound #11 Right,Distal Calcaneus o Topical Lidocaine 4% cream applied to wound bed prior to debridement Wound #12 Right,Proximal Calcaneus o Topical Lidocaine 4% cream applied to wound bed prior to debridement Wound #13 Left Calcaneus o Topical Lidocaine 4% cream applied to wound bed prior to debridement Macneill, Reona H. ([id number]) Wound #3 Midline Back o Topical Lidocaine 4% cream applied to wound bed prior to debridement Wound #7 Right Ischial Tuberosity o Topical Lidocaine 4% cream applied to wound bed prior to debridement Wound #8 Left Malleolus o Topical Lidocaine 4% cream applied to wound bed prior to debridement Wound #9 Left  Ischial Tuberosity o Topical Lidocaine 4% cream applied to wound bed prior to debridement Skin Barriers/Peri-Wound Care Wound #10 Right,Medial Foot o Skin Prep Wound #11 Right,Distal Calcaneus o Skin Prep Wound #12 Right,Proximal Calcaneus o Skin Prep Wound #13 Left Calcaneus o Skin Prep Wound #3 Midline Back o Skin Prep Wound #7 Right Ischial Tuberosity o Skin Prep Wound #8 Left Malleolus o Skin Prep Wound #9 Left Ischial Tuberosity o Skin Prep Primary Wound Dressing Wound #11 Right,Distal Calcaneus o Santyl Ointment Wound #3 Midline Back o Santyl Ointment Wound #8 Left Malleolus o Santyl Ointment Wound #10 Right,Medial Foot Altamura, Saxon H. ([id number]) o Prisma Ag Wound #12 Right,Proximal Calcaneus o Prisma Ag Wound #13 Left Calcaneus o Prisma Ag Wound #7 Right Ischial Tuberosity o Prisma Ag Wound #9 Left Ischial Tuberosity o Prisma Ag Secondary Dressing Wound #10 Right,Medial Foot o Boardered Foam Dressing Wound #11 Right,Distal Calcaneus o Boardered Foam Dressing Wound #12 Right,Proximal Calcaneus o Boardered Foam Dressing Wound #13 Left Calcaneus o Boardered Foam Dressing Wound #3 Midline Back o Boardered Foam Dressing Wound #7 Right Ischial Tuberosity o Boardered Foam Dressing Wound #8 Left Malleolus o Boardered Foam Dressing Wound #9 Left Ischial Tuberosity o Boardered Foam Dressing Dressing Change Frequency Wound #11 Right,Distal Calcaneus o Change dressing every day. Wound #3 Midline Back o Change dressing every day. Wound #8 Left Malleolus o Change dressing every day. Smiddy, Citlali H. ([id number]) Wound #10 Right,Medial Foot o Change dressing every other day. Wound #12 Right,Proximal Calcaneus o Change dressing every other day. Wound #13 Left Calcaneus o Change dressing every other day. Wound #7 Right Ischial Tuberosity o Change dressing every other day. Wound #9 Left Ischial  Tuberosity o Change dressing every other day. Follow-up Appointments Wound #10 Right,Medial Foot o Return Appointment in 1 week. Wound #11 Right,Distal Calcaneus o Return Appointment in 1 week. Wound #12 Right,Proximal Calcaneus o Return Appointment in 1 week. Wound #13 Left Calcaneus o Return Appointment in 1 week. Wound #3 Midline Back o Return Appointment in 1 week. Wound #7 Right Ischial Tuberosity o Return Appointment in 1 week. Wound #8 Left Malleolus o Return Appointment in 1 week. Wound #9 Left Ischial Tuberosity o Return Appointment in 1 week. Off-Loading Wound #10 Right,Medial Foot o Turn and reposition every 2 hours o Other: - float heels while in the bed Wound #11 Right,Distal Calcaneus o Turn and reposition every 2 hours o Other: - float heels while in the bed Dorko, Rosita H. ([id number]) Wound #12 Right,Proximal Calcaneus o Turn and reposition every 2 hours o Other: - float heels while in the bed Wound #13 Left Calcaneus o Turn and reposition every 2 hours o Other: - float heels while in the bed Wound #3 Midline Back o Turn and reposition every 2 hours o Other: - float heels while  in the bed Wound #7 Right Ischial Tuberosity o Turn and reposition every 2 hours o Other: - float heels while in the bed Wound #8 Left Malleolus o Turn and reposition every 2 hours o Other: - float heels while in the bed Wound #9 Left Ischial Tuberosity o Turn and reposition every 2 hours o Other: - float heels while in the bed Home Health Wound #10 [place] Visits - [place] Nurse may visit PRN to address patientos wound care needs. o FACE TO FACE ENCOUNTER: MEDICARE and MEDICAID PATIENTS: I certify that this patient is under my care and that I had a face-to-face encounter that meets the physician face-to-face encounter requirements with this patient on this date. The  encounter with the patient was in whole or in part for the following MEDICAL CONDITION: (primary reason for [place]) MEDICAL NECESSITY: I certify, that based on my findings, NURSING services are a medically necessary home health service. HOME BOUND STATUS: I certify that my clinical findings support that this patient is homebound (i.e., Due to illness or injury, pt requires aid of supportive devices such as crutches, cane, wheelchairs, walkers, the use of special transportation or the assistance of another person to leave their place of residence. There is a normal inability to leave the home and doing so requires considerable and taxing effort. Other absences are for medical reasons / religious services and are infrequent or of short duration when for other reasons). o If current dressing causes regression in wound condition, may D/C ordered dressing product/s and apply Normal Saline Moist Dressing daily until next [place] / Other MD appointment. [place] of regression in wound condition at [phone]. o Please direct any NON-WOUND related issues/requests for orders to patient's Primary Care Physician Wound #11 [place] Visits - [place] Pinchback, Soriya H. ([id number]) o [place] Nurse may visit PRN to address patientos wound care needs. o FACE TO FACE ENCOUNTER: MEDICARE and MEDICAID PATIENTS: I certify that this patient is under my care and that I had a face-to-face encounter that meets the physician face-to-face encounter requirements with this patient on this date. The encounter with the patient was in whole or in part for the following MEDICAL CONDITION: (primary reason for [place]) MEDICAL NECESSITY: I certify, that based on my findings, NURSING services are a medically necessary home health service. HOME BOUND STATUS: I certify that my clinical findings support that this patient is  homebound (i.e., Due to illness or injury, pt requires aid of supportive devices such as crutches, cane, wheelchairs, walkers, the use of special transportation or the assistance of another person to leave their place of residence. There is a normal inability to leave the home and doing so requires considerable and taxing effort. Other absences are for medical reasons / religious services and are infrequent or of short duration when for other reasons). o If current dressing causes regression in wound condition, may D/C ordered dressing product/s and apply Normal Saline Moist Dressing daily until next [place] / Other MD appointment. [place] of regression in wound condition at [phone]. o Please direct any NON-WOUND related issues/requests for orders to patient's Primary Care Physician Wound #12 Right,Proximal Calcaneus o [place] Visits - [place] Nurse may visit PRN to address patientos wound care needs. o FACE TO FACE ENCOUNTER: MEDICARE and MEDICAID PATIENTS: I certify that this patient is under my  care and that I had a face-to-face encounter that meets the physician face-to-face encounter requirements with this patient on this date. The encounter with the patient was in whole or in part for the following MEDICAL CONDITION: (primary reason for [place]) MEDICAL NECESSITY: I certify, that based on my findings, NURSING services are a medically necessary home health service. HOME BOUND STATUS: I certify that my clinical findings support that this patient is homebound (i.e., Due to illness or injury, pt requires aid of supportive devices such as crutches, cane, wheelchairs, walkers, the use of special transportation or the assistance of another person to leave their place of residence. There is a normal inability to leave the home and doing so requires considerable and taxing effort. Other absences are for medical  reasons / religious services and are infrequent or of short duration when for other reasons). o If current dressing causes regression in wound condition, may D/C ordered dressing product/s and apply Normal Saline Moist Dressing daily until next [place] / Other MD appointment. [place] of regression in wound condition at [phone]. o Please direct any NON-WOUND related issues/requests for orders to patient's Primary Care Physician Wound #13 Left Calcaneus o [place] [facility] - [place] Nurse may visit PRN to address patientos wound care needs. o FACE TO FACE ENCOUNTER: MEDICARE and MEDICAID PATIENTS: I certify that this patient is under my care and that I had a face-to-face encounter that meets the physician face-to-face encounter requirements with this patient on this date. The encounter with the patient was in whole or in part for the following MEDICAL CONDITION: (primary reason for [place]) MEDICAL NECESSITY: I certify, that based on my findings, NURSING services are a medically necessary home health service. HOME BOUND STATUS: I certify that my clinical findings support that this patient is homebound (i.e., Due to illness or injury, pt requires aid of Hymes, Aalyiah H. ([id number]) supportive devices such as crutches, cane, wheelchairs, walkers, the use of special transportation or the assistance of another person to leave their place of residence. There is a normal inability to leave the home and doing so requires considerable and taxing effort. Other absences are for medical reasons / religious services and are infrequent or of short duration when for other reasons). o If current dressing causes regression in wound condition, may D/C ordered dressing product/s and apply Normal Saline Moist Dressing daily until next [place] / Other MD appointment. [place] of regression in wound  condition at [phone]. o Please direct any NON-WOUND related issues/requests for orders to patient's Primary Care Physician Wound #3 Midline Back o [place] [facility] - [place] Nurse may visit PRN to address patientos wound care needs. o FACE TO FACE ENCOUNTER: MEDICARE and MEDICAID PATIENTS: I certify that this patient is under my care and that I had a face-to-face encounter that meets the physician face-to-face encounter requirements with this patient on this date. The encounter with the patient was in whole or in part for the following MEDICAL CONDITION: (primary reason for [place]) MEDICAL NECESSITY: I certify, that based on my findings, NURSING services are a medically necessary home health service. HOME BOUND STATUS: I certify that my clinical findings support that this patient is homebound (i.e., Due to illness or injury, pt requires aid of supportive devices such as crutches, cane, wheelchairs, walkers, the use of special transportation or the assistance of another person to leave their place  of residence. There is a normal inability to leave the home and doing so requires considerable and taxing effort. Other absences are for medical reasons / religious services and are infrequent or of short duration when for other reasons). o If current dressing causes regression in wound condition, may D/C ordered dressing product/s and apply Normal Saline Moist Dressing daily until next [place] / Other MD appointment. [place] of regression in wound condition at [phone]. o Please direct any NON-WOUND related issues/requests for orders to patient's Primary Care Physician Wound #7 Right Ischial [place] [facility] - [place] Nurse may visit PRN to address patientos wound care needs. o FACE TO FACE ENCOUNTER: MEDICARE and MEDICAID PATIENTS: I certify that this patient  is under my care and that I had a face-to-face encounter that meets the physician face-to-face encounter requirements with this patient on this date. The encounter with the patient was in whole or in part for the following MEDICAL CONDITION: (primary reason for [place]) MEDICAL NECESSITY: I certify, that based on my findings, NURSING services are a medically necessary home health service. HOME BOUND STATUS: I certify that my clinical findings support that this patient is homebound (i.e., Due to illness or injury, pt requires aid of supportive devices such as crutches, cane, wheelchairs, walkers, the use of special transportation or the assistance of another person to leave their place of residence. There is a normal inability to leave the home and doing so requires considerable and taxing effort. Other absences are for medical reasons / religious services and are infrequent or of short duration when for other reasons). o If current dressing causes regression in wound condition, may D/C ordered dressing product/s and apply Normal Saline Moist Dressing daily until next [place] / Other MD appointment. [place] of regression in wound condition at [phone]. Faulkenberry, Cintya H. ([id number]) o Please direct any NON-WOUND related issues/requests for orders to patient's Primary Care Physician Wound #8 Left [place] [facility] - [place] Nurse may visit PRN to address patientos wound care needs. o FACE TO FACE ENCOUNTER: MEDICARE and MEDICAID PATIENTS: I certify that this patient is under my care and that I had a face-to-face encounter that meets the physician face-to-face encounter requirements with this patient on this date. The encounter with the patient was in whole or in part for the following MEDICAL CONDITION: (primary reason for [place]) MEDICAL NECESSITY: I certify, that based on my findings, NURSING  services are a medically necessary home health service. HOME BOUND STATUS: I certify that my clinical findings support that this patient is homebound (i.e., Due to illness or injury, pt requires aid of supportive devices such as crutches, cane, wheelchairs, walkers, the use of special transportation or the assistance of another person to leave their place of residence. There is a normal inability to leave the home and doing so requires considerable and taxing effort. Other absences are for medical reasons / religious services and are infrequent or of short duration when for other reasons). o If current dressing causes regression in wound condition, may D/C ordered dressing product/s and apply Normal Saline Moist Dressing daily until next [place] / Other MD appointment. [place] of regression in wound condition at [phone]. o Please direct any NON-WOUND related issues/requests for orders to patient's Primary Care Physician Wound #9 Left Ischial [place] [facility] - [place] o  Home Health Nurse may visit PRN to address patientos wound care needs. o FACE TO FACE ENCOUNTER: MEDICARE and MEDICAID PATIENTS: I certify that this patient is under my care and that I had a face-to-face encounter that meets the physician face-to-face encounter requirements with this patient on this date. The encounter with the patient was in whole or in part for the following MEDICAL CONDITION: (primary reason for [place]) MEDICAL NECESSITY: I certify, that based on my findings, NURSING services are a medically necessary home health service. HOME BOUND STATUS: I certify that my clinical findings support that this patient is homebound (i.e., Due to illness or injury, pt requires aid of supportive devices such as crutches, cane, wheelchairs, walkers, the use of special transportation or the assistance of another person to leave their place of  residence. There is a normal inability to leave the home and doing so requires considerable and taxing effort. Other absences are for medical reasons / religious services and are infrequent or of short duration when for other reasons). o If current dressing causes regression in wound condition, may D/C ordered dressing product/s and apply Normal Saline Moist Dressing daily until next [place] / Other MD appointment. [place] of regression in wound condition at [phone]. o Please direct any NON-WOUND related issues/requests for orders to patient's Primary Care Physician Electronic Signature(s) Signed: 11/07/2015 4:56:18 PM By: Christin Fudge MD, FACS Signed: 11/07/2015 5:53:24 PM By: Laurell Roof, Shae H. ([id number]) Entered By: Alric Quan on 11/07/2015 16:49:37 Tynan, Barney H. ([id number]) -------------------------------------------------------------------------------- Problem List Details Patient Name: Jaeger, Gearldine H. Date of Service: 11/07/2015 3:30 PM Medical Record Number: [id number] Patient Account Number: [id number] Date of Birth/Sex: Aug 02, 1946 (70 y.o. Female) Treating RN: Ahmed Prima Primary Care Physician: Paulita Cradle Other Clinician: Referring Physician: Paulita Cradle Treating Physician/Extender: Frann Rider in Treatment: 38 Active Problems ICD-10 Encounter Code Description Active Date Diagnosis E11.621 Type 2 diabetes mellitus with foot ulcer 03/13/2015 Yes L89.613 Pressure ulcer of right heel, stage 3 03/13/2015 Yes F17.218 Nicotine dependence, cigarettes, with other nicotine- 03/13/2015 Yes induced disorders L89.100 Pressure ulcer of unspecified part of back, unstageable 05/02/2015 Yes L89.213 Pressure ulcer of right hip, stage 3 08/05/2015 Yes Inactive Problems Resolved Problems ICD-10 Code Description Active Date Resolved Date L89.013 Pressure ulcer of right elbow, stage 3 03/13/2015  03/13/2015 [id number] Pressure ulcer of left hip, stage 2 05/30/2015 05/30/2015 L89.153 Pressure ulcer of sacral region, stage 3 05/30/2015 05/30/2015 Electronic Signature(s) [name], Makaylah Lemmie Evens ([id number]) Signed: 11/07/2015 4:51:25 PM By: Christin Fudge MD, FACS Entered By: Christin Fudge on 11/07/2015 16:51:25 Heldt, Chamaine H. ([id number]) -------------------------------------------------------------------------------- Progress Note Details Patient Name: Dulany, Elise H. Date of Service: 11/07/2015 3:30 PM Medical Record Number: [id number] Patient Account Number: [id number] Date of Birth/Sex: 1945/09/22 (70 y.o. Female) Treating RN: Ahmed Prima Primary Care Physician: Paulita Cradle Other Clinician: Referring Physician: Paulita Cradle Treating Physician/Extender: Frann Rider in Treatment: 46 Subjective Chief Complaint Information obtained from Patient Patient presents to the wound care center for a consult due non healing wound. Ulcers on the right elbow and the right heel for about 1 month. History of Present Illness (HPI) The following HPI elements were documented for the patient's wound: Location: Ulceration on the right heel and the right elbow. Quality: Patient reports experiencing a dull pain to affected area(s). Severity: Patient states wound (s) are getting better. Duration: Patient has had the wound for < 4 weeks prior to presenting for treatment Timing: Pain in wound is Intermittent (comes and  goes Context: The wound appeared gradually over time Modifying Factors: Consults to this date include:Augmentin and Bactrim and also some heel protection with duoderm Associated Signs and Symptoms: Patient reports having difficulty standing for long periods. 70 year old female with history of peripheral neuropathy, history of diet controlled diabetes mellitus type 2, history of alcoholism here for wound consult sent by her PCP Dr. Sherilyn Cooter. She has pressure ulcers at her right  elbow, bilateral heels. Plain films of right calcaneus without acute bony process. Patient started by PCP on Augmentin, Bactrim as per orders, DuoDerm dressings applied - reports some improvement in her ulcer since last seen. Denies fever, chills, nausea, vomiting, diarrhea. She had a right humerus fracture in the middle of May and has had no surgery and arm is in a sling. She is also been laying in the bed for quite a while. Past medical history significant for essential hypertension, osteoporosis, peripheral neuropathy, alcoholism, ataxia, personal history of breast cancer treated with surgery chemotherapy and radiation and this was done in December 2010. she is also status post laparoscopic cholecystectomy, pilonidal cyst excision, subcutaneous port placement, partial mastectomy on the left side, skin cancer removal. 03/21/2015 -- she says overall she's been doing better and continues to smoke about 15 cigarettes a day. 03/21/2015 - her orthopedic doctor has said she may require surgery for her right humerus fracture. 04/04/2015 -- her orthopedic surgery has been scheduled for August 11. 04/18/2015 -- she is doing fine as far as her elbow and her right heel goes but she has developed some redness over prominence on her thoracic spine and wanted me to take a look at this. Baria, Roselene H. ([id number]) 05/02/2015 -- she had her surgery done and now is in a sling and support. Her back has developed a pressure injury of undetermined stage. She seems to be in better spirits. 05/16/2015 -- last week her right heel was looking great and we had healed it out, but she has not been offloading appropriately and has a deep tissue injury on the right heel again. The area on her right elbow has opened out with slough and the area in the thoracic spine is also getting worse. 05/30/2015 -- she has developed 2 new ulcerations one on her left ischial tuberosity and one on the sacral region. She is still working  on getting up smoking but is also unable to take her vitamins as she says she develops a diarrhea when she takes vitamins. She has increased her intake of proteins. 06/06/2015 -- the patient's husband manages to get her a low air loss mattress with initiating pressure but did not know how to use it exactly and the patient was not happy about using it. Overall she says she's been feeling better. 07/11/2015 -- . Discussed a surgical opinion for debridement and application of a wound VAC, 2 weeks ago but the patient has been reluctant to get a surgical opinion as she wants to avoid surgery. 07/18/2015 -- they have an appointment to see Dr. Tamala Julian this coming Wednesday. 07/29/2015 -- they saw Dr. Tamala Julian in his office and he did a debridement of the wound and removed significant amount of slough. This is in addition to the debridement I had done previously on Friday where a large amount of the eschar was removed. He did not recommend the application of wound VAC. Addendum : Dr. Thompson Caul note was reviewed via EPIC and details noted as above. 08/05/2015 -- over the last week she has developed a another pressure injury to  her right hip and has had significant discoloration of the skin and an eschar there. 08/15/2015 -- she is still smoking about a pack of cigarettes a day and does not seem to want to quit. They have not been able to talk to the vendor regarding the air mattress and I will ask them to get in touch again. She is reluctant to take vitamins and does admit her nutrition is poor. 08/22/2015 -- she has been unable to tolerate her vitamins and continues to smoke. They're working on getting a low air loss mattress and have been speaking with the vendor. 09/19/2015 -- since her last visit and was admitted to the hospital between 09/09/2015 and 09/16/2015. She was thought to have an active sepsis possibly from one of her decubitus ulcers but nothing was grown except for an MSSA from her thoracic  spine region. CT of this area did not show any osteomyelitis. Been given IV antibiotics which included vancomycin and Zosyn in the hospital under the care of Dr. [name] the ID specialist she was sent home on oral Keflex 500 mg 3 times a day for 2 weeks. he will consider an MRI in the outpatient setting to completely rule out osteomyelitis of the spine. 10/03/2015 --readmitted to hospital on 09/23/2015 for general debility, lethargy and possible sepsis and workup was in progress. Seen by Dr. [name] and he would also like a workup for underlying malignancy as she s had previous ultrasounds of the breasts suggesting findings of concern. Workup done so far does not suggest deep bony infection. She was treated for a pneumonia and received Zosyn and vancomycin. She had been recommended Cipro 500 twice a day and doxycycline 100 mg twice a day once she was to be [place] home. The antibiotics were to be stopped on 10/07/2015. 10/17/2015 -- patient is feeling much better and has been eating better and doing her offloading as much as possible. 10/23/2015 -- she has an appointment to see Dr. [name] tomorrow but other than that has been doing as much as possible with offloading and increasing her diet. 11/07/2015 -- she has not been here to see [place] for 2 weeks and at the present time continues to try and eat better, work on off loading and is using the air mattress at night. She saw her PCP yesterday and she has put her back on a fentanyl patch. Bieri, Ilham H. ([id number]) Objective Constitutional Pulse regular. Respirations normal and unlabored. Afebrile. Vitals Time Taken: 3:39 PM, Height: 65 in, Weight: 100 lbs, BMI: 16.6, Temperature: 98.1 F, Pulse: 115 bpm, Respiratory Rate: 20 breaths/min, Blood Pressure: 95/60 mmHg. Eyes Nonicteric. Reactive to light. Ears, Nose, Mouth, and Throat Lips, teeth, and gums WNL.Marland Kitchen Moist mucosa without lesions. Neck supple and nontender. No palpable  supraclavicular or cervical adenopathy. Normal sized without goiter. Respiratory WNL. No retractions.. Cardiovascular Pedal Pulses WNL. No clubbing, cyanosis or edema. Chest Breasts symmetical and no nipple discharge.. Breast tissue WNL, no masses, lumps, or tenderness.. Lymphatic No adneopathy. No adenopathy. No adenopathy. Musculoskeletal Adexa without tenderness or enlargement.. Digits and nails w/o clubbing, cyanosis, infection, petechiae, ischemia, or inflammatory conditions.Marland Kitchen Psychiatric Judgement and insight Intact.. No evidence of depression, anxiety, or agitation.. General Notes: after reviewing each of the wounds serially some of them required sharp debridement with forceps and scissors and this has been down to the subcutaneous tissue and bleeding controlled with pressure. Several of the wounds had good granulation tissue and these were just cleansed with saline. Integumentary (Hair, Skin) No suspicious lesions.  No crepitus or fluctuance. No peri-wound warmth or erythema. No masses.. Wound #10 status is Open. Original cause of wound was Gradually Appeared. The wound is located on the Right,Medial Foot. The wound measures 0.1cm length x 0.1cm width x 0.1cm depth; 0.008cm^2 area and [place], [place] H. ([id number]) 0.001cm^3 volume. The wound is limited to skin breakdown. There is no tunneling or undermining noted. There is a medium amount of serous drainage noted. The wound margin is flat and intact. There is large (67-100%) red granulation within the wound bed. There is no necrotic tissue within the wound bed. The periwound skin appearance exhibited: Moist, Erythema. The periwound skin appearance did not exhibit: Callus, Crepitus, Excoriation, Fluctuance, Friable, Induration, Localized Edema, Rash, Scarring, Dry/Scaly. The surrounding wound skin color is noted with erythema which is circumferential. Periwound temperature was noted as No Abnormality. The periwound has tenderness on  palpation. Wound #11 status is Open. Original cause of wound was Pressure Injury. The wound is located on the Right,Distal Calcaneus. The wound measures 3.2cm length x 2.5cm width x 0.1cm depth; 6.283cm^2 area and 0.628cm^3 volume. The wound is limited to skin breakdown. There is undermining starting at 5:00 and ending at 1:00 with a maximum distance of 2.5cm. There is a large amount of serous drainage noted. The wound margin is flat and intact. There is small (1-33%) pink granulation within the wound bed. There is a large (67-100%) amount of necrotic tissue within the wound bed including Eschar and Adherent Slough. The periwound skin appearance exhibited: Maceration, Moist. The periwound skin appearance did not exhibit: Callus, Crepitus, Excoriation, Fluctuance, Friable, Induration, Localized Edema, Rash, Scarring, Dry/Scaly, Atrophie Blanche, Cyanosis, Ecchymosis, Hemosiderin Staining, Mottled, Pallor, Rubor, Erythema. Periwound temperature was noted as No Abnormality. The periwound has tenderness on palpation. Wound #12 status is Open. Original cause of wound was Pressure Injury. The wound is located on the Right,Proximal Calcaneus. The wound measures 0.8cm length x 0.9cm width x 0.1cm depth; 0.565cm^2 area and 0.057cm^3 volume. The wound is limited to skin breakdown. There is no tunneling or undermining noted. There is a large amount of serous drainage noted. The wound margin is flat and intact. There is large (67-100%) pink granulation within the wound bed. There is a small (1-33%) amount of necrotic tissue within the wound bed including Adherent Slough. The periwound skin appearance exhibited: Moist. Periwound temperature was noted as No Abnormality. The periwound has tenderness on palpation. Wound #13 status is Open. Original cause of wound was Pressure Injury. The wound is located on the Left Calcaneus. The wound measures 1.2cm length x 1.5cm width x 0.1cm depth; 1.414cm^2 area  and 0.141cm^3 volume. The wound is limited to skin breakdown. There is no tunneling or undermining noted. There is a large amount of serous drainage noted. The wound margin is flat and intact. There is large (67- 100%) red, pink granulation within the wound bed. There is a small (1-33%) amount of necrotic tissue within the wound bed including Adherent Slough. The periwound skin appearance exhibited: Maceration, Moist. Periwound temperature was noted as No Abnormality. The periwound has tenderness on palpation. Wound #3 status is Open. Original cause of wound was Pressure Injury. The wound is located on the Midline Back. The wound measures 2.7cm length x 2.5cm width x 0.1cm depth; 5.301cm^2 area and 0.53cm^3 volume. The wound is limited to skin breakdown. There is no tunneling noted, however, there is undermining starting at 11:00 and ending at 1:00 with a maximum distance of 1.9cm. There is a large amount of serosanguineous  drainage noted. The wound margin is distinct with the outline attached to the wound base. There is small (1-33%) granulation within the wound bed. There is a large (67-100%) amount of necrotic tissue within the wound bed including Eschar and Adherent Slough. The periwound skin appearance exhibited: Moist. The periwound skin appearance did not exhibit: Callus, Crepitus, Excoriation, Fluctuance, Friable, Induration, Localized Edema, Rash, Scarring, Dry/Scaly, Maceration, Atrophie Blanche, Cyanosis, Ecchymosis, Hemosiderin Staining, Mottled, Pallor, Rubor, Erythema. Periwound temperature was noted as No Abnormality. Wound #7 status is Open. Original cause of wound was Pressure Injury. The wound is located on the Right Ischial Tuberosity. The wound measures 0.9cm length x 0.2cm width x 0.1cm depth; 0.141cm^2 area and 0.014cm^3 volume. The wound is limited to skin breakdown. There is no tunneling or undermining noted. [place]ahan, Joli H. ([id number]) There is a medium amount of serous  drainage noted. The wound margin is distinct with the outline attached to the wound base. There is small (1-33%) pink granulation within the wound bed. There is a large (67- 100%) amount of necrotic tissue within the wound bed including Adherent Slough. The periwound skin appearance exhibited: Moist. The periwound skin appearance did not exhibit: Callus, Crepitus, Excoriation, Fluctuance, Friable, Induration, Localized Edema, Rash, Scarring, Dry/Scaly, Maceration, Atrophie Blanche, Cyanosis, Ecchymosis, Hemosiderin Staining, Mottled, Pallor, Rubor, Erythema. Periwound temperature was noted as No Abnormality. The periwound has tenderness on palpation. Wound #8 status is Open. Original cause of wound was Gradually Appeared. The wound is located on the Left Malleolus. The wound measures 1.7cm length x 1cm width x 0.2cm depth; 1.335cm^2 area and 0.267cm^3 volume. The wound is limited to skin breakdown. There is no tunneling or undermining noted. There is a medium amount of serous drainage noted. The wound margin is flat and intact. There is no granulation within the wound bed. There is a large (67-100%) amount of necrotic tissue within the wound bed including Adherent Slough. The periwound skin appearance exhibited: Moist, Erythema. The periwound skin appearance did not exhibit: Callus, Crepitus, Excoriation, Fluctuance, Friable, Induration, Localized Edema, Rash, Scarring, Dry/Scaly, Maceration, Atrophie Blanche, Cyanosis, Ecchymosis, Hemosiderin Staining, Mottled, Pallor, Rubor. The surrounding wound skin color is noted with erythema which is circumferential. Periwound temperature was noted as No Abnormality. The periwound has tenderness on palpation. Wound #9 status is Open. Original cause of wound was Gradually Appeared. The wound is located on the Left Ischial Tuberosity. The wound measures 0.9cm length x 0.6cm width x 0.2cm depth; 0.424cm^2 area and 0.085cm^3 volume. The wound is limited to  skin breakdown. There is no tunneling or undermining noted. There is a medium amount of serous drainage noted. The wound margin is flat and intact. There is medium (34-66%) red, pink granulation within the wound bed. There is a medium (34-66%) amount of necrotic tissue within the wound bed including Eschar and Adherent Slough. The periwound skin appearance exhibited: Moist. The periwound skin appearance did not exhibit: Callus, Crepitus, Excoriation, Fluctuance, Friable, Induration, Localized Edema, Rash, Scarring, Dry/Scaly, Maceration, Atrophie Blanche, Cyanosis, Ecchymosis, Hemosiderin Staining, Mottled, Pallor, Rubor, Erythema. Periwound temperature was noted as No Abnormality. 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 ([id number]) I have recommended we use Santyl over all the wounds with slough and those with healthy granulation tissue we'll use  Prisma AG. I have also asked her to continue to offload as well as possible. We have again discussed offloading, vitamin supplements and increase protein and she is agreeable to use this. We will see her back next week. Procedures Wound #11 Wound #11 is a Pressure Ulcer located on the Right,Distal Calcaneus . There was a Skin/Subcutaneous Tissue Debridement [id number]) debridement with total area of 8 sq cm performed by Christin Fudge, MD. with the following instrument(s): Forceps and Scissors to remove Viable and Non-Viable tissue/material after achieving pain control using Other (lidocaine 4% cream). A time out was conducted prior to the start of the procedure. There was no bleeding. The procedure was tolerated well with a pain level of 0  throughout and a pain level of 0 following the procedure. Post Debridement Measurements: 3.2cm length x 2.5cm width x 0.1cm depth; 0.628cm^3 volume. Post debridement Stage noted as Category/Stage II. Post procedure Diagnosis Wound #11: Same as Pre-Procedure Wound #3 Wound #3 is a Pressure Ulcer located on the Midline Back . There was a Skin/Subcutaneous Tissue Debridement [id number]) debridement with total area of 6.75 sq cm performed by Christin Fudge, MD. with the following instrument(s): Forceps and Scissors to remove Viable and Non-Viable tissue/material after achieving pain control using Other (lidocaine 4% cream). A time out was conducted prior to the start of the procedure. There was no bleeding. The procedure was tolerated well with a pain level of 0 throughout and a pain level of 0 following the procedure. Post Debridement Measurements: 2.7cm length x 2.5cm width x 0.2cm depth; 1.06cm^3 volume. Post debridement Stage noted as Category/Stage II. Post procedure Diagnosis Wound #3: Same as Pre-Procedure Wound #7 Wound #7 is a Pressure Ulcer located on the Right Ischial Tuberosity . There was a Skin/Subcutaneous Tissue Debridement [id number]) debridement with total area of 0.18 sq cm performed by Christin Fudge, MD. with the following instrument(s): Forceps and Scissors to remove Viable and Non-Viable tissue/material after achieving pain control using Other (lidocaine 4% cream). A time out was conducted prior to the start of the procedure. There was no bleeding. The procedure was tolerated well with a pain level of 0 throughout and a pain level of 0 following the procedure. Post Debridement Measurements: 0.9cm length x 0.2cm width x 0.2cm depth; 0.028cm^3 volume. Post debridement Stage noted as Category/Stage III. Post procedure Diagnosis Wound #7: Same as Pre-Procedure Wound #8 Wound #8 is a Pressure Ulcer located on the Left Malleolus . There was a Skin/Subcutaneous  Tissue Debridement [id number]) debridement with total area of 1.7 sq cm performed by Christin Fudge, MD. with the following instrument(s): Forceps to remove Viable and Non-Viable tissue/material after achieving pain control using Other (lidocaine 4% cream). A time out was conducted prior to the start of the procedure. There was no bleeding. The procedure was tolerated well with a pain level of 0 throughout and a pain level Hermans, Natoya H. ([id number]) of 0 following the procedure. Post Debridement Measurements: 1.7cm length x 1cm width x 0.2cm depth; 0.267cm^3 volume. Post debridement Stage noted as Category/Stage II. Post procedure Diagnosis Wound #8: Same as Pre-Procedure Plan Wound Cleansing: Wound #10 Right,Medial Foot: Clean wound with Normal Saline. Wound #11 Right,Distal Calcaneus: Clean wound with Normal Saline. Wound #12 Right,Proximal Calcaneus: Clean wound with Normal Saline. Wound #13 Left Calcaneus: Clean wound with Normal Saline. Wound #3 Midline Back: Clean wound with Normal Saline. Wound #7 Right Ischial Tuberosity: Clean wound with Normal Saline. Wound #8 Left Malleolus: Clean wound with Normal Saline. Wound #9 Left Ischial  Tuberosity: Clean wound with Normal Saline. Anesthetic: Wound #10 Right,Medial Foot: Topical Lidocaine 4% cream applied to wound bed prior to debridement Wound #11 Right,Distal Calcaneus: Topical Lidocaine 4% cream applied to wound bed prior to debridement Wound #12 Right,Proximal Calcaneus: Topical Lidocaine 4% cream applied to wound bed prior to debridement Wound #13 Left Calcaneus: Topical Lidocaine 4% cream applied to wound bed prior to debridement Wound #3 Midline Back: Topical Lidocaine 4% cream applied to wound bed prior to debridement Wound #7 Right Ischial Tuberosity: Topical Lidocaine 4% cream applied to wound bed prior to debridement Wound #8 Left Malleolus: Topical Lidocaine 4% cream applied to wound bed prior to  debridement Wound #9 Left Ischial Tuberosity: Topical Lidocaine 4% cream applied to wound bed prior to debridement Skin Barriers/Peri-Wound Care: Wound #10 Right,Medial Foot: Skin Prep Wound #11 Right,Distal Calcaneus: Skin Prep Schiraldi, Camey H. ([id number]) Wound #12 Right,Proximal Calcaneus: Skin Prep Wound #13 Left Calcaneus: Skin Prep Wound #3 Midline Back: Skin Prep Wound #7 Right Ischial Tuberosity: Skin Prep Wound #8 Left Malleolus: Skin Prep Wound #9 Left Ischial Tuberosity: Skin Prep Primary Wound Dressing: Wound #11 Right,Distal Calcaneus: Santyl Ointment Wound #3 Midline Back: Santyl Ointment Wound #8 Left Malleolus: Santyl Ointment Wound #10 Right,Medial Foot: Prisma Ag Wound #12 Right,Proximal Calcaneus: Prisma Ag Wound #13 Left Calcaneus: Prisma Ag Wound #7 Right Ischial Tuberosity: Prisma Ag Wound #9 Left Ischial Tuberosity: Prisma Ag Secondary Dressing: Wound #10 Right,Medial Foot: Boardered Foam Dressing Wound #11 Right,Distal Calcaneus: Boardered Foam Dressing Wound #12 Right,Proximal Calcaneus: Boardered Foam Dressing Wound #13 Left Calcaneus: Boardered Foam Dressing Wound #3 Midline Back: Boardered Foam Dressing Wound #7 Right Ischial Tuberosity: Boardered Foam Dressing Wound #8 Left Malleolus: Boardered Foam Dressing Wound #9 Left Ischial Tuberosity: Boardered Foam Dressing Dressing Change Frequency: Wound #11 Right,Distal Calcaneus: Change dressing every day. Wound #3 Midline Back: Change dressing every day. Yeung, Tonica H. ([id number]) Wound #8 Left Malleolus: Change dressing every day. Wound #10 Right,Medial Foot: Change dressing every other day. Wound #12 Right,Proximal Calcaneus: Change dressing every other day. Wound #13 Left Calcaneus: Change dressing every other day. Wound #7 Right Ischial Tuberosity: Change dressing every other day. Wound #9 Left Ischial Tuberosity: Change dressing every other day. Follow-up  Appointments: Wound #10 Right,Medial Foot: Return Appointment in 1 week. Wound #11 Right,Distal Calcaneus: Return Appointment in 1 week. Wound #12 Right,Proximal Calcaneus: Return Appointment in 1 week. Wound #13 Left Calcaneus: Return Appointment in 1 week. Wound #3 Midline Back: Return Appointment in 1 week. Wound #7 Right Ischial Tuberosity: Return Appointment in 1 week. Wound #8 Left Malleolus: Return Appointment in 1 week. Wound #9 Left Ischial Tuberosity: Return Appointment in 1 week. Off-Loading: Wound #10 Right,Medial Foot: Turn and reposition every 2 hours Other: - float heels while in the bed Wound #11 Right,Distal Calcaneus: Turn and reposition every 2 hours Other: - float heels while in the bed Wound #12 Right,Proximal Calcaneus: Turn and reposition every 2 hours Other: - float heels while in the bed Wound #13 Left Calcaneus: Turn and reposition every 2 hours Other: - float heels while in the bed Wound #3 Midline Back: Turn and reposition every 2 hours Other: - float heels while in the bed Wound #7 Right Ischial Tuberosity: Turn and reposition every 2 hours Other: - float heels while in the bed Wound #8 Left Malleolus: Turn and reposition every 2 hours Other: - float heels while in the bed Eckard, Moncerrat H. ([id number]) Wound #9 Left Ischial Tuberosity: Turn and reposition every 2 hours Other: -  float heels while in the bed Home Health: Wound #10 Right,Medial Foot: [place] Visits - [facility] Nurse may visit PRN to address patient s wound care needs. FACE TO FACE ENCOUNTER: MEDICARE and MEDICAID PATIENTS: I certify that this patient is under my care and that I had a face-to-face encounter that meets the physician face-to-face encounter requirements with this patient on this date. The encounter with the patient was in whole or in part for the following MEDICAL CONDITION: (primary reason for [place]) MEDICAL NECESSITY: I  certify, that based on my findings, NURSING services are a medically necessary home health service. HOME BOUND STATUS: I certify that my clinical findings support that this patient is homebound (i.e., Due to illness or injury, pt requires aid of supportive devices such as crutches, cane, wheelchairs, walkers, the use of special transportation or the assistance of another person to leave their place of residence. There is a normal inability to leave the home and doing so requires considerable and taxing effort. Other absences are for medical reasons / religious services and are infrequent or of short duration when for other reasons). If current dressing causes regression in wound condition, may D/C ordered dressing product/s and apply Normal Saline Moist Dressing daily until next [place] / Other MD appointment. [place] of regression in wound condition at [phone]. Please direct any NON-WOUND related issues/requests for orders to patient's Primary Care Physician Wound #11 Right,Distal Calcaneus: [place] Visits - [facility] Nurse may visit PRN to address patient s wound care needs. FACE TO FACE ENCOUNTER: MEDICARE and MEDICAID PATIENTS: I certify that this patient is under my care and that I had a face-to-face encounter that meets the physician face-to-face encounter requirements with this patient on this date. The encounter with the patient was in whole or in part for the following MEDICAL CONDITION: (primary reason for [place]) MEDICAL NECESSITY: I certify, that based on my findings, NURSING services are a medically necessary home health service. HOME BOUND STATUS: I certify that my clinical findings support that this patient is homebound (i.e., Due to illness or injury, pt requires aid of supportive devices such as crutches, cane, wheelchairs, walkers, the use of special transportation or the assistance of another person to leave  their place of residence. There is a normal inability to leave the home and doing so requires considerable and taxing effort. Other absences are for medical reasons / religious services and are infrequent or of short duration when for other reasons). If current dressing causes regression in wound condition, may D/C ordered dressing product/s and apply Normal Saline Moist Dressing daily until next [place] / Other MD appointment. [place] of regression in wound condition at [phone]. Please direct any NON-WOUND related issues/requests for orders to patient's Primary Care Physician Wound #12 Right,Proximal Calcaneus: [place] Visits - [facility] Nurse may visit PRN to address patient s wound care needs. FACE TO FACE ENCOUNTER: MEDICARE and MEDICAID PATIENTS: I certify that this patient is under my care and that I had a face-to-face encounter that meets the physician face-to-face encounter requirements with this patient on this date. The encounter with the patient was in whole or in part for the following MEDICAL CONDITION: (primary reason for [place]) MEDICAL NECESSITY: I certify, that based on my findings, NURSING services are a medically necessary home health service. HOME BOUND STATUS: I certify that my clinical findings support that this patient  is homebound (i.e., Due to illness or injury, pt requires aid of supportive devices such as crutches, cane, wheelchairs, walkers, the use of special transportation or the assistance of another person to leave their place of residence. There is a normal inability to leave the home and doing so requires considerable and taxing effort. Other absences are for medical reasons / religious services and are infrequent or of short duration when for other reasons). Scarola, Carrigan H. ([id number]) If current dressing causes regression in wound condition, may D/C ordered dressing product/s and  apply Normal Saline Moist Dressing daily until next [place] / Other MD appointment. [place] of regression in wound condition at [phone]. Please direct any NON-WOUND related issues/requests for orders to patient's Primary Care Physician Wound #13 Left Calcaneus: [place] Visits - [facility] Nurse may visit PRN to address patient s wound care needs. FACE TO FACE ENCOUNTER: MEDICARE and MEDICAID PATIENTS: I certify that this patient is under my care and that I had a face-to-face encounter that meets the physician face-to-face encounter requirements with this patient on this date. The encounter with the patient was in whole or in part for the following MEDICAL CONDITION: (primary reason for [place]) MEDICAL NECESSITY: I certify, that based on my findings, NURSING services are a medically necessary home health service. HOME BOUND STATUS: I certify that my clinical findings support that this patient is homebound (i.e., Due to illness or injury, pt requires aid of supportive devices such as crutches, cane, wheelchairs, walkers, the use of special transportation or the assistance of another person to leave their place of residence. There is a normal inability to leave the home and doing so requires considerable and taxing effort. Other absences are for medical reasons / religious services and are infrequent or of short duration when for other reasons). If current dressing causes regression in wound condition, may D/C ordered dressing product/s and apply Normal Saline Moist Dressing daily until next [place] / Other MD appointment. [place] of regression in wound condition at [phone]. Please direct any NON-WOUND related issues/requests for orders to patient's Primary Care Physician Wound #3 Midline Back: [place] Visits - [facility] Nurse may visit PRN to address patient s wound  care needs. FACE TO FACE ENCOUNTER: MEDICARE and MEDICAID PATIENTS: I certify that this patient is under my care and that I had a face-to-face encounter that meets the physician face-to-face encounter requirements with this patient on this date. The encounter with the patient was in whole or in part for the following MEDICAL CONDITION: (primary reason for [place]) MEDICAL NECESSITY: I certify, that based on my findings, NURSING services are a medically necessary home health service. HOME BOUND STATUS: I certify that my clinical findings support that this patient is homebound (i.e., Due to illness or injury, pt requires aid of supportive devices such as crutches, cane, wheelchairs, walkers, the use of special transportation or the assistance of another person to leave their place of residence. There is a normal inability to leave the home and doing so requires considerable and taxing effort. Other absences are for medical reasons / religious services and are infrequent or of short duration when for other reasons). If current dressing causes regression in wound condition, may D/C ordered dressing product/s and apply Normal Saline Moist Dressing daily until next [place] / Other MD appointment. [place] of regression in wound condition at [phone]. Please direct  any NON-WOUND related issues/requests for orders to patient's Primary Care Physician Wound #7 Right Ischial Tuberosity: [place] Visits - [facility] Nurse may visit PRN to address patient s wound care needs. FACE TO FACE ENCOUNTER: MEDICARE and MEDICAID PATIENTS: I certify that this patient is under my care and that I had a face-to-face encounter that meets the physician face-to-face encounter requirements with this patient on this date. The encounter with the patient was in whole or in part for the following MEDICAL CONDITION: (primary reason for [place]) MEDICAL  NECESSITY: I certify, that based on my findings, NURSING services are a medically necessary home health service. HOME BOUND STATUS: I certify that my clinical findings support that this patient is homebound (i.e., Due to illness or injury, pt requires aid of supportive devices such as crutches, cane, wheelchairs, walkers, the use of special transportation or the assistance of another person to leave their place of residence. There is a normal inability to leave the home and doing so requires considerable and taxing effort. Other absences are for medical reasons / religious services and are infrequent or of short duration when for other reasons). Wolfinger, Jonisha H. ([id number]) If current dressing causes regression in wound condition, may D/C ordered dressing product/s and apply Normal Saline Moist Dressing daily until next [place] / Other MD appointment. [place] of regression in wound condition at [phone]. Please direct any NON-WOUND related issues/requests for orders to patient's Primary Care Physician Wound #8 Left Malleolus: [place] Visits - [facility] Nurse may visit PRN to address patient s wound care needs. FACE TO FACE ENCOUNTER: MEDICARE and MEDICAID PATIENTS: I certify that this patient is under my care and that I had a face-to-face encounter that meets the physician face-to-face encounter requirements with this patient on this date. The encounter with the patient was in whole or in part for the following MEDICAL CONDITION: (primary reason for [place]) MEDICAL NECESSITY: I certify, that based on my findings, NURSING services are a medically necessary home health service. HOME BOUND STATUS: I certify that my clinical findings support that this patient is homebound (i.e., Due to illness or injury, pt requires aid of supportive devices such as crutches, cane, wheelchairs, walkers, the use of special transportation or the  assistance of another person to leave their place of residence. There is a normal inability to leave the home and doing so requires considerable and taxing effort. Other absences are for medical reasons / religious services and are infrequent or of short duration when for other reasons). If current dressing causes regression in wound condition, may D/C ordered dressing product/s and apply Normal Saline Moist Dressing daily until next [place] / Other MD appointment. [place] of regression in wound condition at [phone]. Please direct any NON-WOUND related issues/requests for orders to patient's Primary Care Physician Wound #9 Left Ischial Tuberosity: [place] Visits - [facility] Nurse may visit PRN to address patient s wound care needs. FACE TO FACE ENCOUNTER: MEDICARE and MEDICAID PATIENTS: I certify that this patient is under my care and that I had a face-to-face encounter that meets the physician face-to-face encounter requirements with this patient on this date. The encounter with the patient was in whole or in part for the following MEDICAL CONDITION: (primary reason for [place]) MEDICAL NECESSITY: I certify, that based on my findings, NURSING services are a medically necessary home health service. HOME BOUND STATUS: I  certify that my clinical findings support that this patient is homebound (i.e., Due to illness or injury, pt requires aid of supportive devices such as crutches, cane, wheelchairs, walkers, the use of special transportation or the assistance of another person to leave their place of residence. There is a normal inability to leave the home and doing so requires considerable and taxing effort. Other absences are for medical reasons / religious services and are infrequent or of short duration when for other reasons). If current dressing causes regression in wound condition, may D/C ordered dressing product/s and  apply Normal Saline Moist Dressing daily until next [place] / Other MD appointment. [place] of regression in wound condition at [phone]. Please direct any NON-WOUND related issues/requests for orders to patient's Primary Care Physician I have recommended we use Santyl over all the wounds with slough and those with healthy granulation tissue we'll use Prisma AG. I have also asked her to continue to offload as well as possible. We have again discussed offloading, vitamin supplements and increase protein and she is agreeable to use this. Bieker, Cherrise H. ([id number]) We will see her back next week. Electronic Signature(s) Signed: 11/07/2015 4:55:23 PM By: Christin Fudge MD, FACS Entered By: Christin Fudge on 11/07/2015 16:55:23 Bartnick, Amya H. ([id number]) -------------------------------------------------------------------------------- SuperBill Details Patient Name: [name], Lorah H. Date of Service: 11/07/2015 Medical Record Number: [id number] Patient Account Number: [id number] Date of Birth/Sex: 10/01/1945 (70 y.o. Female) Treating RN: Carolyne Fiscal, Debi Primary Care Physician: Paulita Cradle Other Clinician: Referring Physician: Paulita Cradle Treating Physician/Extender: Frann Rider in Treatment: 34 Diagnosis Coding ICD-10 Codes Code Description 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, stage 3 F17.218 Nicotine dependence, cigarettes, with other nicotine-induced disorders L89.100 Pressure ulcer of unspecified part of back, unstageable L89.213 Pressure ulcer of right hip, stage 3 Facility Procedures CPT4 Code: [id number] Description: [id number] - DEB SUBQ TISSUE 20 SQ CM/< ICD-10 Description Diagnosis 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, stage 3 L89.100 Pressure ulcer of unspecified part of back, unst L89.213 Pressure ulcer of  right hip, stage  3 Modifier: ageable Quantity: 1 Physician Procedures CPT4 Code: [id number] Description: [id number] - WC PHYS SUBQ TISS 20 SQ CM ICD-10 Description Diagnosis 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, stage 3 L89.100 Pressure ulcer of unspecified part of back, unst 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

## 2015-11-13 ENCOUNTER — Encounter: Payer: Medicare Other | Attending: Surgery | Admitting: Surgery

## 2015-11-13 DIAGNOSIS — L89100 Pressure ulcer of unspecified part of back, unstageable: Secondary | ICD-10-CM | POA: Diagnosis not present

## 2015-11-13 DIAGNOSIS — E11621 Type 2 diabetes mellitus with foot ulcer: Secondary | ICD-10-CM | POA: Diagnosis not present

## 2015-11-13 DIAGNOSIS — E1140 Type 2 diabetes mellitus with diabetic neuropathy, unspecified: Secondary | ICD-10-CM | POA: Diagnosis not present

## 2015-11-13 DIAGNOSIS — L89213 Pressure ulcer of right hip, stage 3: Secondary | ICD-10-CM | POA: Diagnosis not present

## 2015-11-13 DIAGNOSIS — L89613 Pressure ulcer of right heel, stage 3: Secondary | ICD-10-CM | POA: Diagnosis not present

## 2015-11-13 DIAGNOSIS — I10 Essential (primary) hypertension: Secondary | ICD-10-CM | POA: Insufficient documentation

## 2015-11-13 DIAGNOSIS — F17218 Nicotine dependence, cigarettes, with other nicotine-induced disorders: Secondary | ICD-10-CM | POA: Insufficient documentation

## 2015-11-13 DIAGNOSIS — F1021 Alcohol dependence, in remission: Secondary | ICD-10-CM | POA: Insufficient documentation

## 2015-11-13 DIAGNOSIS — M810 Age-related osteoporosis without current pathological fracture: Secondary | ICD-10-CM | POA: Diagnosis not present

## 2015-11-13 DIAGNOSIS — Z853 Personal history of malignant neoplasm of breast: Secondary | ICD-10-CM | POA: Insufficient documentation

## 2015-11-14 NOTE — Progress Notes (Addendum)
OLIVETO, Neidra H. ([id number]) Visit Report for 11/13/2015 Arrival Information Details Patient Name: [name], [name] Date of Service: 11/13/2015 2:45 PM Medical Record Number: [id number] Patient Account Number: [id number] Date of Birth/Sex: 11-Jan-1946 (70 y.o. Female) Treating RN: Carolyne Fiscal, Debi Primary Care Physician: Paulita Cradle Other Clinician: Referring Physician: Paulita Cradle Treating Physician/Extender: Frann Rider in Treatment: 77 Visit Information History Since Last Visit All ordered tests and consults were completed: No Patient Arrived: Walker Added or deleted any medications: No Arrival Time: 14:44 Any new allergies or adverse reactions: No Accompanied By: husband Had a fall or experienced change in No Transfer Assistance: Easy[employer] activities of daily living that may affect Patient Lift risk of falls: Patient Identification Verified: Yes Signs or symptoms of abuse/neglect since last No Secondary Verification Process Yes visito Completed: Hospitalized since last visit: No Patient Requires Transmission- No Pain Present Now: No Based Precautions: Patient Has Alerts: No Electronic Signature(s) Signed: 11/13/2015 4:40:16 PM By: Alric Quan Entered By: Alric Quan on 11/13/2015 14:45:52 Caisse, Shelly H. ([id number]) -------------------------------------------------------------------------------- Encounter Discharge Information Details Patient Name: Mckenzie Fusi, [name] Date of Service: 11/13/2015 2:45 PM Medical Record Number: [id number] Patient Account Number: [id number] Date of Birth/Sex: 15-Apr-1946 (70 y.o. Female) Treating RN: Carolyne Fiscal, Debi Primary Care Physician: Paulita Cradle Other Clinician: Referring Physician: Paulita Cradle Treating Physician/Extender: Frann Rider in Treatment: 73 Encounter Discharge Information Items Discharge Pain Level: 0 Discharge Condition: Stable Ambulatory Status: Walker Discharge Destination:  Home Transportation: Private Auto Accompanied By: husband Schedule Follow-up Appointment: Yes Medication Reconciliation completed Yes and provided to Patient/Care Eilene Voigt: Provided on Clinical Summary of Care: 11/13/2015 Form Type Recipient Paper Patient AS Electronic Signature(s) Signed: 11/13/2015 4:40:16 PM By: Alric Quan Previous Signature: 11/13/2015 3:58:03 PM Version By: Ruthine Dose Entered By: Alric Quan on 11/13/2015 16:00:36 Tiano, Charish H. ([id number]) -------------------------------------------------------------------------------- Lower Extremity Assessment Details Patient Name: Tipping, [name] Date of Service: 11/13/2015 2:45 PM Medical Record Number: [id number] Patient Account Number: [id number] Date of Birth/Sex: 04/05/1946 (70 y.o. Female) Treating RN: Carolyne Fiscal, Debi Primary Care Physician: Paulita Cradle Other Clinician: Referring Physician: Paulita Cradle Treating Physician/Extender: Frann Rider in Treatment: 35 Vascular Assessment Pulses: Posterior Tibial Dorsalis Pedis Palpable: [Left:Yes] [Right:Yes] Extremity colors, hair growth, and conditions: Extremity Color: [Left:Mottled] [Right:Normal] Temperature of Extremity: [Left:Warm] [Right:Warm] Capillary Refill: [Left:< 3 seconds] [Right:< 3 seconds] Toe Nail Assessment Left: Right: Thick: No No Discolored: No No Deformed: No No Improper Length and Hygiene: No No Electronic Signature(s) Signed: 11/13/2015 4:40:16 PM By: Alric Quan Entered By: Alric Quan on 11/13/2015 14:54:06 Khatib, Aymara H. ([id number]) -------------------------------------------------------------------------------- Multi Wound Chart Details Patient Name: Mogg, [name] Date of Service: 11/13/2015 2:45 PM Medical Record Number: [id number] Patient Account Number: [id number] Date of Birth/Sex: 1946/02/24 (70 y.o. Female) Treating RN: Carolyne Fiscal, Debi Primary Care Physician: Paulita Cradle Other  Clinician: Referring Physician: Paulita Cradle Treating Physician/Extender: Frann Rider in Treatment: 35 Vital Signs Height(in): 65 Pulse(bpm): 122 Weight(lbs): 100 Blood Pressure 128/78 (mmHg): Body Mass Index(BMI): 17 Temperature(F): 98.2 Respiratory Rate 20 (breaths/min): Photos: [10:No Photos] [11:No Photos] [12:No Photos] Wound Location: [10:Right Foot - Medial] [11:Right Calcaneus - Distal] [12:Right Calcaneus - Proximal] Wounding Event: [10:Gradually Appeared] [11:Pressure Injury] [12:Pressure Injury] 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] [11:Pressure Ulcer] [12:Pressure Ulcer] Comorbid History: [10:Cataracts, Asthma, Hypertension, Type II Hypertension, Type II Hypertension, Type II Diabetes, Neuropathy, Diabetes, Neuropathy, Diabetes, Neuropathy, Received Chemotherapy Received Chemotherapy Received Chemotherapy] [11:Cataracts,  Asthma,] [12:Cataracts, Asthma,] Date Acquired: [10:09/12/2015] [11:09/29/2015] [12:11/07/2015] Weeks of Treatment: [10:7] [11:5] [12:0] Wound Status: [10:Open] [11:Open] [12:Open] Measurements L x W  x D 0.4x0.5x0.1 [11:2.6x5.5x0.2] [12:0.9x1x0.1] (cm) Area (cm) : [10:0.157] [11:11.231] [id number] Volume (cm) : [10:0.016] [11:2.246] [12:0.071] % Reduction in Area: [10:33.50%] [11:-1488.50%] [12:-25.10%] % Reduction in Volume: 33.30% [11:-3063.40%] [12:-24.60%] Tunneling: [10:No] [11:No] [12:No] Classification: [10:Category/Stage II] [11:Category/Stage II] [12:Category/Stage II] HBO Classification: [10:Grade 1] [11:Grade 1] [12:Grade 1] Exudate Amount: [10:Medium] [11:Large] [12:Large] Exudate Type: [10:Serous] [11:Serous] [12:Serous] Exudate Color: [10:amber] [11:amber] [12:amber] Wound Margin: [10:Flat and Intact] [11:Flat and Intact] [12:Flat and Intact] Granulation Amount: [10:None Present (0%)] [11:Small (1-33%)] [12:Medium (34-66%)] Granulation Quality: [10:N/A] [11:Pink] [12:Pink] Necrotic Amount: [10:Large (67-100%)]  [11:Large (67-100%)] [12:Medium (34-66%)] Necrotic Tissue: [10:Adherent Slough] [11:Adherent Slough] [12:Adherent Slough] Exposed Structures: Fascia: No Fascia: No Fascia: No Fat: No Fat: No Fat: No Tendon: No Tendon: No Tendon: No Muscle: No Muscle: No Muscle: No Joint: No Joint: No Joint: No Bone: No Bone: No Bone: No Limited to Skin Limited to Skin Limited to Skin Breakdown Breakdown Breakdown Epithelialization: None None None Periwound Skin Texture: Edema: No Edema: No No Abnormalities Noted Excoriation: No Excoriation: No Induration: No Induration: No Callus: No Callus: No Crepitus: No Crepitus: No Fluctuance: No Fluctuance: No Friable: No Friable: No Rash: No Rash: No Scarring: No Scarring: No Periwound Skin Moist: Yes Maceration: Yes Moist: Yes Moisture: Dry/Scaly: No Moist: Yes Dry/Scaly: No Periwound Skin Color: Erythema: Yes Atrophie Blanche: No No Abnormalities Noted Cyanosis: No Ecchymosis: No Erythema: No Hemosiderin Staining: No Mottled: No Pallor: No Rubor: No Erythema Location: Circumferential N/A N/A Temperature: No Abnormality No Abnormality No Abnormality Tenderness on Yes Yes Yes Palpation: Wound Preparation: Ulcer Cleansing: Ulcer Cleansing: Ulcer Cleansing: Rinsed/Irrigated with Rinsed/Irrigated with Rinsed/Irrigated with Saline Saline Saline Topical Anesthetic Topical Anesthetic Topical Anesthetic Applied: Other: lidocaine Applied: Other: lidocaine Applied: Other: lidocaine 4% 4% 4% Wound Number: [id number]  Photos: No Photos No Photos No Photos Wound Location: Left Calcaneus Back - Midline Right Ischial Tuberosity Wounding Event: Pressure Injury Pressure Injury Pressure Injury Primary Etiology: Pressure Ulcer Pressure Ulcer Pressure Ulcer Comorbid History: Cataracts, Asthma, Cataracts, Asthma, Cataracts, Asthma, Hypertension, Type II Hypertension, Type II Hypertension, Type II Diabetes, Neuropathy, Diabetes, Neuropathy,  Diabetes, Neuropathy, Received Chemotherapy Received Chemotherapy Received Chemotherapy ALLGYER, Betta H. ([id number]) Date Acquired: 11/02/2015 05/02/2015 08/01/2015 Weeks of Treatment: 0 27 14 Wound Status: Open Open Open Measurements L x W x D 0.7x0.8x0.1 1.4x2.8x0.1 0.9x0.6x0.1 (cm) Area (cm) : 0.44 3.079 0.424 Volume (cm) : 0.044 0.308 0.042 % Reduction in Area: 68.90% -1299.50% 86.50% % Reduction in Volume: 68.80% -1300.00% 86.60% Position 1 (o'clock): 12 Maximum Distance 1 1.4 (cm): Tunneling: No Yes No Classification: Category/Stage II Category/Stage II Category/Stage III HBO Classification: Grade 1 N/A N/A Exudate Amount: Large Large Medium Exudate Type: Serous Serosanguineous Serous Exudate Color: amber red, brown amber Wound Margin: Flat and Intact Distinct, outline attached Distinct, outline attached Granulation Amount: Medium (34-66%) Medium (34-66%) None Present (0%) Granulation Quality: Red, Pink Red, Pink N/A Necrotic Amount: Medium (34-66%) Medium (34-66%) Large (67-100%) Necrotic Tissue: Adherent Slough Adherent Slough Eschar Exposed Structures: Fascia: No Fascia: No Fascia: No Fat: No Fat: No Fat: No Tendon: No Tendon: No Tendon: No Muscle: No Muscle: No Muscle: No Joint: No Joint: No Joint: No Bone: No Bone: No Bone: No Limited to Skin Limited to Skin Limited to Skin Breakdown Breakdown Breakdown Epithelialization: None None None Periwound Skin Texture: No Abnormalities Noted Edema: No Edema: No Excoriation: No Excoriation: No Induration: No Induration: No Callus: No Callus: No Crepitus: No Crepitus: No Fluctuance: No Fluctuance: No Friable: No Friable: No Rash: No Rash: No Scarring: No  Scarring: No Periwound Skin Maceration: Yes Moist: Yes Moist: Yes Moisture: Moist: Yes Maceration: No Maceration: No Dry/Scaly: No Dry/Scaly: No Periwound Skin Color: No Abnormalities Noted Atrophie Blanche: No Atrophie Blanche:  No Cyanosis: No Cyanosis: No Ecchymosis: No Ecchymosis: No Erythema: No Erythema: No Hemosiderin Staining: No Hemosiderin Staining: No Mottled: No Mottled: No Robards, Ludivina H. ([id number]) Pallor: No Pallor: No Rubor: No Rubor: No Erythema Location: N/A N/A N/A Temperature: No Abnormality No Abnormality No Abnormality Tenderness on Yes No Yes Palpation: Wound Preparation: Ulcer Cleansing: Ulcer Cleansing: Ulcer Cleansing: Rinsed/Irrigated with Rinsed/Irrigated with Rinsed/Irrigated with Saline Saline Saline Topical Anesthetic Topical Anesthetic Topical Anesthetic Applied: Other: lidocaine Applied: Other: lidocaine Applied: Other: lidocaine 4% 4% 4% Wound Number: [id number] N/A Photos: No Photos No Photos N/A Wound Location: Left Malleolus Left Ischial Tuberosity N/A Wounding Event: Gradually Appeared Gradually Appeared N/A Primary Etiology: Pressure Ulcer Pressure Ulcer N/A Comorbid History: Cataracts, Asthma, Cataracts, Asthma, N/A Hypertension, Type II Hypertension, Type II Diabetes, Neuropathy, Diabetes, Neuropathy, Received Chemotherapy Received Chemotherapy Date Acquired: 09/12/2015 09/12/2015 N/A Weeks of Treatment: 7 7 N/A Wound Status: Open Open N/A Measurements L x W x D 0.6x1x0.2 0.6x0.6x0.2 N/A (cm) Area (cm) : 0.471 0.283 N/A Volume (cm) : 0.094 0.057 N/A % Reduction in Area: -902.10% -44.40% N/A % Reduction in Volume: -944.40% -185.00% N/A Tunneling: No No N/A Classification: Category/Stage II Category/Stage II N/A HBO Classification: Grade 0 N/A N/A Exudate Amount: Medium Medium N/A Exudate Type: Serous Serous N/A Exudate Color: amber amber N/A Wound Margin: Flat and Intact Flat and Intact N/A Granulation Amount: Medium (34-66%) None Present (0%) N/A Granulation Quality: N/A N/A N/A Necrotic Amount: Medium (34-66%) Large (67-100%) N/A Necrotic Tissue: Adherent Slough Adherent Slough N/A Exposed Structures: Fascia: No Fascia: No N/A Fat: No Fat:  No Tendon: No Tendon: No Muscle: No Muscle: No Joint: No Joint: No Bone: No Bone: No Nigh, Hiliary H. ([id number]) Limited to Skin Limited to Skin Breakdown Breakdown Epithelialization: None None N/A Periwound Skin Texture: Edema: No Edema: No N/A Excoriation: No Excoriation: No Induration: No Induration: No Callus: No Callus: No Crepitus: No Crepitus: No Fluctuance: No Fluctuance: No Friable: No Friable: No Rash: No Rash: No Scarring: No Scarring: No Periwound Skin Moist: Yes Moist: Yes N/A Moisture: Maceration: No Maceration: No Dry/Scaly: No Dry/Scaly: No Periwound Skin Color: Erythema: Yes Atrophie Blanche: No N/A Atrophie Blanche: No Cyanosis: No Cyanosis: No Ecchymosis: No Ecchymosis: No Erythema: No Hemosiderin Staining: No Hemosiderin Staining: No Mottled: No Mottled: No Pallor: No Pallor: No Rubor: No Rubor: No Erythema Location: Circumferential N/A N/A Temperature: No Abnormality No Abnormality N/A Tenderness on Yes No N/A Palpation: Wound Preparation: Ulcer Cleansing: Ulcer Cleansing: N/A Rinsed/Irrigated with Rinsed/Irrigated with Saline Saline Topical Anesthetic Topical Anesthetic Applied: Other: Lidocaine Applied: Other: lidocaine 4% 4% Treatment Notes Electronic Signature(s) Signed: 11/13/2015 4:40:16 PM By: Alric Quan Entered By: Alric Quan on 11/13/2015 15:22:11 Maser, Selda H. ([id number]) -------------------------------------------------------------------------------- Multi-Disciplinary Care Plan Details Patient Name: Mckenzie Fusi, Edona H. Date of Service: 11/13/2015 2:45 PM Medical Record Number: [id number] Patient Account Number: [id number] Date of Birth/Sex: 1946/08/23 (70 y.o. Female) Treating RN: Carolyne Fiscal, Debi Primary Care Physician: Paulita Cradle Other Clinician: Referring Physician: Paulita Cradle Treating Physician/Extender: Frann Rider in Treatment: 75 Active Inactive Abuse / Safety / Falls /  Self Care Management Nursing Diagnoses: Impaired home maintenance Impaired physical mobility Knowledge deficit related to: safety; personal, health (wound), emergency Potential for falls Self care deficit: actual or potential Goals: Patient will remain injury free Date Initiated: 03/13/2015 Goal  Status: Active Patient/caregiver will verbalize understanding of skin care regimen Date Initiated: 03/13/2015 Goal Status: Active Patient/caregiver will verbalize/demonstrate measure taken to improve self care Date Initiated: 03/13/2015 Goal Status: Active Patient/caregiver will verbalize/demonstrate measures taken to improve the patient's personal safety Date Initiated: 03/13/2015 Goal Status: Active Patient/caregiver will verbalize/demonstrate measures taken to prevent injury and/or falls Date Initiated: 03/13/2015 Goal Status: Active Patient/caregiver will verbalize/demonstrate understanding of what to do in case of emergency Date Initiated: 03/13/2015 Goal Status: Active Interventions: Assess fall risk on admission and as needed Assess: immobility, friction, shearing, incontinence upon admission and as needed Assess impairment of mobility on admission and as needed per policy Assess self care needs on admission and as needed Provide education on basic hygiene Miranda, Zamyia H. ([id number]) Provide education on fall prevention Provide education on personal and home safety Provide education on safe transfers Treatment Activities: Education provided on Basic Hygiene : 03/13/2015 Notes: Orientation to the Wound Care Program Nursing Diagnoses: Knowledge deficit related to the wound healing center program Goals: Patient/caregiver will verbalize understanding of the [place] Date Initiated: 03/13/2015 Goal Status: Active Interventions: Provide education on orientation to the wound center Notes: Pressure Nursing Diagnoses: Knowledge deficit related to causes and risk  factors for pressure ulcer development Knowledge deficit related to management of pressures ulcers Potential for impaired tissue integrity related to pressure, friction, moisture, and shear Goals: Patient will remain free from development of additional pressure ulcers Date Initiated: 03/13/2015 Goal Status: Active Patient will remain free of pressure ulcers Date Initiated: 03/13/2015 Goal Status: Active Patient/caregiver will verbalize risk factors for pressure ulcer development Date Initiated: 03/13/2015 Goal Status: Active Patient/caregiver will verbalize understanding of pressure ulcer management Date Initiated: 03/13/2015 Goal Status: Active Interventions: Assess: immobility, friction, shearing, incontinence upon admission and as needed Parisi, Torianna H. ([id number]) Assess offloading mechanisms upon admission and as needed Assess potential for pressure ulcer upon admission and as needed Provide education on pressure ulcers Treatment Activities: Patient referred for home evaluation of offloading devices/mattresses : 11/07/2015 Patient referred for pressure reduction/relief devices : 11/07/2015 Patient referred for seating evaluation to ensure proper offloading : 11/07/2015 Pressure reduction/relief device ordered : 11/07/2015 Test ordered outside of clinic : 11/07/2015 Notes: Wound/Skin Impairment Nursing Diagnoses: Impaired tissue integrity Knowledge deficit related to ulceration/compromised skin integrity Goals: Patient/caregiver will verbalize understanding of skin care regimen Date Initiated: 03/13/2015 Goal Status: Active Ulcer/skin breakdown will have a volume reduction of 30% by week 4 Date Initiated: 03/13/2015 Goal Status: Active Ulcer/skin breakdown will have a volume reduction of 50% by week 8 Date Initiated: 03/13/2015 Goal Status: Active Ulcer/skin breakdown will have a volume reduction of 80% by week 12 Date Initiated: 03/13/2015 Goal Status: Active Ulcer/skin  breakdown will heal within 14 weeks Date Initiated: 03/13/2015 Goal Status: Active Interventions: Assess patient/caregiver ability to obtain necessary supplies Assess patient/caregiver ability to perform ulcer/skin care regimen upon admission and as needed Assess ulceration(s) every visit Provide education on smoking Provide education on ulcer and skin care Treatment Activities: Patient referred to home care : 11/07/2015 DAUPHIN, Jayne Lemmie Evens ([id number]) Referred to DME Jaelon Gatley for dressing supplies : 11/07/2015 Skin care regimen initiated : 11/07/2015 Topical wound management initiated : 11/07/2015 Notes: Electronic Signature(s) Signed: 11/13/2015 4:40:16 PM By: Alric Quan Entered By: Alric Quan on 11/13/2015 15:22:00 Marando, Maryanne H. ([id number]) -------------------------------------------------------------------------------- Pain Assessment Details Patient Name: Mckenzie Fusi, Anabeth H. Date of Service: 11/13/2015 2:45 PM Medical Record Number: [id number] Patient Account Number: [id number] Date of Birth/Sex: 1946/04/13 (70 y.o. Female) Treating RN: Carolyne Fiscal,  Debi Primary Care Physician: Paulita Cradle Other Clinician: Referring Physician: Paulita Cradle Treating Physician/Extender: Frann Rider in Treatment: 35 Active Problems Location of Pain Severity and Description of Pain Patient Has Paino No Site Locations Pain Management and Medication Current Pain Management: Electronic Signature(s) Signed: 11/13/2015 4:40:16 PM By: Alric Quan Entered By: Alric Quan on 11/13/2015 14:46:01 Stemmler, Chazlyn H. ([id number]) -------------------------------------------------------------------------------- Patient/Caregiver Education Details Patient Name: Mckenzie Fusi, Tisha H. Date of Service: 11/13/2015 2:45 PM Medical Record Number: [id number] Patient Account Number: [id number] Date of Birth/Gender: [birth year] (70 y.o. Female) Treating RN: Carolyne Fiscal, Debi Primary Care Physician:  Paulita Cradle Other Clinician: Referring Physician: Paulita Cradle Treating Physician/Extender: Frann Rider in Treatment: 20 Education Assessment Education Provided To: Patient and Caregiver Education Topics Provided Wound/Skin Impairment: Handouts: Other: change dressing as ordered Methods: Demonstration, Explain/Verbal Responses: State content correctly Electronic Signature(s) Signed: 11/13/2015 4:40:16 PM By: Alric Quan Entered By: Alric Quan on 11/13/2015 16:00:59 [place], [place]. ([id number]) -------------------------------------------------------------------------------- Wound Assessment Details Patient Name: Calvi, Iriana H. Date of Service: 11/13/2015 2:45 PM Medical Record Number: [id number] Patient Account Number: [id number] Date of Birth/Sex: Oct 14, 1945 (70 y.o. Female) Treating RN: Carolyne Fiscal, Debi Primary Care Physician: Paulita Cradle Other Clinician: Referring Physician: Paulita Cradle Treating Physician/Extender: Frann Rider in Treatment: 35 Wound Status Wound Number: 10 Primary Pressure Ulcer Etiology: Wound Location: Right Foot - Medial Wound Open Wounding Event: Gradually Appeared Status: Date Acquired: 09/12/2015 Comorbid Cataracts, Asthma, Hypertension, Type Weeks Of Treatment: 7 History: II Diabetes, Neuropathy, Received Clustered Wound: No Chemotherapy Photos Photo Uploaded By: Alric Quan on 11/13/2015 16:24:27 Wound Measurements Length: (cm) 0.4 Width: (cm) 0.5 Depth: (cm) 0.1 Area: (cm) 0.157 Volume: (cm) 0.016 % Reduction in Area: 33.5% % Reduction in Volume: 33.3% Epithelialization: None Tunneling: No Undermining: No Wound Description Classification: Category/Stage II Foul Odor A Diabetic Severity (Wagner): Grade 1 Wound Margin: Flat and Intact Exudate Amount: Medium Exudate Type: Serous Exudate Color: amber fter Cleansing: No Wound Bed Granulation Amount: None Present (0%) Exposed  Structure Necrotic Amount: Large (67-100%) Fascia Exposed: No Necrotic Quality: Adherent Slough Fat Layer Exposed: No Tomasik, Carlinda H. ([id number]) Tendon Exposed: No Muscle Exposed: No Joint Exposed: No Bone Exposed: No Limited to Skin Breakdown Periwound Skin Texture Texture Color No Abnormalities Noted: No No Abnormalities Noted: No Callus: No Erythema: Yes Crepitus: No Erythema Location: Circumferential Excoriation: No Temperature / Pain Fluctuance: No Temperature: No Abnormality Friable: No Tenderness on Palpation: Yes Induration: No Localized Edema: No Rash: No Scarring: No Moisture No Abnormalities Noted: No Dry / Scaly: No Moist: Yes Wound Preparation Ulcer Cleansing: Rinsed/Irrigated with Saline Topical Anesthetic Applied: Other: lidocaine 4%, Treatment Notes Wound #10 (Right, Medial Foot) 1. Cleansed with: Clean wound with Normal Saline 2. Anesthetic Topical Lidocaine 4% cream to wound bed prior to debridement 3. Peri-wound Care: Skin Prep 4. Dressing Applied: Prisma Ag 5. Secondary Dressing Applied Bordered Foam Dressing Electronic Signature(s) Signed: 11/13/2015 4:40:16 PM By: Alric Quan Entered By: Alric Quan on 11/13/2015 15:20:24 Gillian, Shene H. ([id number]) -------------------------------------------------------------------------------- Wound Assessment Details Patient Name: Joffe, Suraya H. Date of Service: 11/13/2015 2:45 PM Medical Record Number: [id number] Patient Account Number: [id number] Date of Birth/Sex: 02-11-1946 (70 y.o. Female) Treating RN: Carolyne Fiscal, Debi Primary Care Physician: Paulita Cradle Other Clinician: Referring Physician: Paulita Cradle Treating Physician/Extender: Frann Rider in Treatment: 35 Wound Status Wound Number: 11 Primary Pressure Ulcer Etiology: Wound Location: Right Calcaneus - Distal Wound Open Wounding Event: Pressure Injury Status: Date Acquired: 09/29/2015 Comorbid Cataracts,  Asthma, Hypertension, Type Weeks Of Treatment: 5 History:  II Diabetes, Neuropathy, Received Clustered Wound: No Chemotherapy Photos Photo Uploaded By: Alric Quan on 11/13/2015 16:24:27 Wound Measurements Length: (cm) 2.6 Width: (cm) 5.5 Depth: (cm) 0.2 Area: (cm) 11.231 Volume: (cm) 2.246 % Reduction in Area: -1488.5% % Reduction in Volume: -3063.4% Epithelialization: None Tunneling: No Undermining: No Wound Description Classification: Category/Stage II Foul Odor Af Diabetic Severity (Wagner): Grade 1 Wound Margin: Flat and Intact Exudate Amount: Large Exudate Type: Serous Exudate Color: amber ter Cleansing: No Wound Bed Granulation Amount: Small (1-33%) Exposed Structure Granulation Quality: Pink Fascia Exposed: No Necrotic Amount: Large (67-100%) Fat Layer Exposed: No Cawthorn, Laasya H. ([id number]) Necrotic Quality: Adherent Slough Tendon Exposed: No Muscle Exposed: No Joint Exposed: No Bone Exposed: No Limited to Skin Breakdown Periwound Skin Texture Texture Color No Abnormalities Noted: No No Abnormalities Noted: No Callus: No Atrophie Blanche: No Crepitus: No Cyanosis: No Excoriation: No Ecchymosis: No Fluctuance: No Erythema: No Friable: No Hemosiderin Staining: No Induration: No Mottled: No Localized Edema: No Pallor: No Rash: No Rubor: No Scarring: No Temperature / Pain Moisture Temperature: No Abnormality No Abnormalities Noted: No Tenderness on Palpation: Yes Dry / Scaly: No Maceration: Yes Moist: Yes Wound Preparation Ulcer Cleansing: Rinsed/Irrigated with Saline Topical Anesthetic Applied: Other: lidocaine 4%, Treatment Notes Wound #11 (Right, Distal Calcaneus) 1. Cleansed with: Clean wound with Normal Saline 2. Anesthetic Topical Lidocaine 4% cream to wound bed prior to debridement 3. Peri-wound Care: Skin Prep 4. Dressing Applied: Santyl Ointment 5. Secondary Dressing Applied Bordered Foam Dressing Electronic  Signature(s) Signed: 11/13/2015 4:40:16 PM By: Alric Quan Entered By: Alric Quan on 11/13/2015 15:19:49 Molesky, Angelissa H. ([id number]) -------------------------------------------------------------------------------- Wound Assessment Details Patient Name: Luepke, Lacinda H. Date of Service: 11/13/2015 2:45 PM Medical Record Number: [id number] Patient Account Number: [id number] Date of Birth/Sex: [birth year] (70 y.o. 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 - Proximal Wound Open Wounding Event: Pressure Injury Status: Date Acquired: 11/07/2015 Comorbid Cataracts, Asthma, Hypertension, Type Weeks Of Treatment: 0 History: II Diabetes, Neuropathy, Received Clustered Wound: No Chemotherapy Photos Photo Uploaded By: Alric Quan on 11/13/2015 16:24:47 Wound Measurements Length: (cm) 0.9 Width: (cm) 1 Depth: (cm) 0.1 Area: (cm) 0.707 Volume: (cm) 0.071 % Reduction in Area: -25.1% % Reduction in Volume: -24.6% Epithelialization: None Tunneling: No Undermining: No Wound Description Classification: Category/Stage II Foul Odor Af Diabetic Severity (Wagner): Grade 1 Wound Margin: Flat and Intact Exudate Amount: Large Exudate Type: Serous Exudate Color: amber ter Cleansing: No Wound Bed Granulation Amount: Medium (34-66%) Exposed Structure Granulation Quality: Pink Fascia Exposed: No Necrotic Amount: Medium (34-66%) Fat Layer Exposed: No Macho, Alfie H. ([id number]) Necrotic Quality: Adherent Slough Tendon Exposed: No Muscle Exposed: No Joint Exposed: No Bone Exposed: No Limited to Skin Breakdown Periwound Skin Texture Texture Color No Abnormalities Noted: No No Abnormalities Noted: No Moisture Temperature  / Pain No Abnormalities Noted: No Temperature: No Abnormality Moist: Yes Tenderness on Palpation: Yes Wound Preparation Ulcer Cleansing: Rinsed/Irrigated with Saline Topical Anesthetic Applied: Other: lidocaine 4%, Treatment Notes Wound #12 (Right, Proximal Calcaneus) 1. Cleansed with: Clean wound with Normal Saline 2. Anesthetic Topical Lidocaine 4% cream to wound bed prior to debridement 3. Peri-wound Care: Skin Prep 4. Dressing Applied: Prisma Ag 5. Secondary Dressing Applied Bordered Foam Dressing Electronic Signature(s) Signed: 11/13/2015 4:40:16 PM By: Alric Quan Entered By: Alric Quan on 11/13/2015 15:18:55 Kenney, Chenelle H. ([id number]) --------------------------------------------------------------------------------  Wound Assessment Details Patient Name: PARROT, Mercia H. Date of Service: 11/13/2015 2:45 PM Medical Record Number: [id number] Patient Account Number: [id number] Date of Birth/Sex: 02-19-1946 (70 y.o. Female) Treating RN: Carolyne Fiscal, Debi Primary Care Physician: Paulita Cradle Other Clinician: Referring Physician: Paulita Cradle Treating Physician/Extender: Frann Rider in Treatment: 35 Wound Status Wound Number: 13 Primary Pressure Ulcer Etiology: Wound Location: Left Calcaneus Wound Open Wounding Event: Pressure Injury Status: Date Acquired: 11/02/2015 Comorbid Cataracts, Asthma, Hypertension, Type Weeks Of Treatment: 0 History: II Diabetes, Neuropathy, Received Clustered Wound: No Chemotherapy Photos Photo Uploaded By: Alric Quan on 11/13/2015 16:24:47 Wound Measurements Length: (cm) 0.7 Width: (cm) 0.8 Depth: (cm) 0.1 Area: (cm) 0.44 Volume: (cm) 0.044 % Reduction in Area: 68.9% % Reduction in Volume: 68.8% Epithelialization: None Tunneling: No Undermining: No Wound Description Classification: Category/Stage II Diabetic Severity Earleen Newport): Grade 1 Wound Margin: Flat and Intact Exudate Amount:  Large Exudate Type: Serous Exudate Color: amber Wound Bed Granulation Amount: Medium (34-66%) Exposed Structure Granulation Quality: Red, Pink Fascia Exposed: No Necrotic Amount: Medium (34-66%) Fat Layer Exposed: No Anastas, Marley H. ([id number]) Necrotic Quality: Adherent Slough Tendon Exposed: No Muscle Exposed: No Joint Exposed: No Bone Exposed: No Limited to Skin Breakdown Periwound Skin Texture Texture Color No Abnormalities Noted: No No Abnormalities Noted: No Moisture Temperature / Pain No Abnormalities Noted: No Temperature: No Abnormality Maceration: Yes Tenderness on Palpation: Yes Moist: Yes Wound Preparation Ulcer Cleansing: Rinsed/Irrigated with Saline Topical Anesthetic Applied: Other: lidocaine 4%, Treatment Notes Wound #13 (Left Calcaneus) 1. Cleansed with: Clean wound with Normal Saline 2. Anesthetic Topical Lidocaine 4% cream to wound bed prior to debridement 3. Peri-wound Care: Skin Prep 4. Dressing Applied: Santyl Ointment 5. Secondary Dressing Applied Bordered Foam Dressing Electronic Signature(s) Signed: 11/13/2015 4:40:16 PM By: Alric Quan Entered By: Alric Quan on 11/13/2015 15:21:05 Fils, Ginia H. ([id number]) -------------------------------------------------------------------------------- Wound Assessment Details Patient Name: Camuso, Ryelee H. Date of Service: 11/13/2015 2:45 PM Medical Record Number: [id number] Patient Account Number: [id number] Date of Birth/Sex: 1946-07-15 (70 y.o. Female) Treating RN: Carolyne Fiscal, Debi Primary Care Physician: Paulita Cradle Other Clinician: Referring Physician: Paulita Cradle Treating Physician/Extender: Frann Rider in Treatment: 35 Wound Status Wound Number: 3 Primary Pressure Ulcer Etiology: Wound Location: Back - Midline Wound Open Wounding Event: Pressure Injury Status: Date Acquired: 05/02/2015 Comorbid Cataracts, Asthma, Hypertension, Type Weeks Of Treatment:  27 History: II Diabetes, Neuropathy, Received Clustered Wound: No Chemotherapy Photos Photo Uploaded By: Alric Quan on 11/13/2015 16:25:17 Wound Measurements Length: (cm) 1.4 Width: (cm) 2.8 Depth: (cm) 0.1 Area: (cm) 3.079 Volume: (cm) 0.308 % Reduction in Area: -1299.5% % Reduction in Volume: -1300% Epithelialization: None Tunneling: Yes Position (o'clock): 12 Maximum Distance: (cm) 1.4 Undermining: No Wound Description Classification: Category/Stage II Wound Margin: Distinct, outline attached Exudate Amount: Large Exudate Type: Serosanguineous Exudate Color: red, brown Foul Odor After Cleansing: No Wound Bed Granulation Amount: Medium (34-66%) Exposed Structure Amico, Thamara H. ([id number]) Granulation Quality: Red, Pink Fascia Exposed: No Necrotic Amount: Medium (34-66%) Fat Layer Exposed: No Necrotic Quality: Adherent Slough Tendon Exposed: No Muscle Exposed: No Joint Exposed: No Bone Exposed: No Limited to Skin Breakdown Periwound Skin Texture Texture Color No Abnormalities Noted: No No Abnormalities Noted: No Callus: No Atrophie Blanche: No Crepitus: No Cyanosis: No Excoriation: No Ecchymosis: No Fluctuance: No Erythema: No Friable: No Hemosiderin Staining: No Induration: No Mottled: No Localized Edema: No Pallor: No Rash: No Rubor: No Scarring: No Temperature / Pain Moisture Temperature: No Abnormality No Abnormalities Noted: No Dry / Scaly: No Maceration: No Moist:  Yes Wound Preparation Ulcer Cleansing: Rinsed/Irrigated with Saline Topical Anesthetic Applied: Other: lidocaine 4%, Treatment Notes Wound #3 (Midline Back) 1. Cleansed with: Clean wound with Normal Saline 2. Anesthetic Topical Lidocaine 4% cream to wound bed prior to debridement 3. Peri-wound Care: Skin Prep 4. Dressing Applied: Santyl Ointment 5. Secondary Dressing Applied Bordered Foam Dressing Electronic Signature(s) Signed: 11/13/2015 4:40:16 PM By:  Alric Quan Entered By: Alric Quan on 11/13/2015 15:17:28 Matin, Linsi H. ([id number]) Kabat, Henderson ([id number]) -------------------------------------------------------------------------------- Wound Assessment Details Patient Name: Bihm, Seraphina H. Date of Service: 11/13/2015 2:45 PM Medical Record Number: [id number] Patient Account Number: [id number] Date of Birth/Sex: 1946/03/10 (70 y.o. 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 Wound Open Wounding Event: Pressure Injury Status: Date Acquired: 08/01/2015 Comorbid Cataracts, Asthma, Hypertension, Type Weeks Of Treatment: 14 History: II Diabetes, Neuropathy, Received Clustered Wound: No Chemotherapy Photos Photo Uploaded By: Alric Quan on 11/13/2015 16:25:17 Wound Measurements Length: (cm) 0.9 Width: (cm) 0.6 Depth: (cm) 0.1 Area: (cm) 0.424 Volume: (cm) 0.042 % Reduction in Area: 86.5% % Reduction in Volume: 86.6% Epithelialization: None Tunneling: No Undermining: No Wound Description Classification: Category/Stage III Wound Margin: Distinct, outline attached Exudate Amount: Medium Exudate Type: Serous Exudate Color: amber Foul Odor After Cleansing: No Wound Bed Granulation Amount: None Present (0%) Exposed Structure Necrotic Amount: Large (67-100%) Fascia Exposed: No Necrotic Quality: Eschar Fat Layer Exposed: No Tendon Exposed: No Marquess, Stephanine H. ([id number]) Muscle Exposed: No Joint Exposed: No Bone Exposed: No Limited to Skin Breakdown Periwound Skin Texture Texture Color No Abnormalities Noted: No No Abnormalities Noted: No Callus: No Atrophie Blanche: No Crepitus: No Cyanosis: No Excoriation: No Ecchymosis:  No Fluctuance: No Erythema: No Friable: No Hemosiderin Staining: No Induration: No Mottled: No Localized Edema: No Pallor: No Rash: No Rubor: No Scarring: No Temperature / Pain Moisture Temperature: No Abnormality No Abnormalities Noted: No Tenderness on Palpation: Yes Dry / Scaly: No Maceration: No Moist: Yes Wound Preparation Ulcer Cleansing: Rinsed/Irrigated with Saline Topical Anesthetic Applied: Other: lidocaine 4%, Treatment Notes Wound #7 (Right Ischial Tuberosity) 1. Cleansed with: Clean wound with Normal Saline 2. Anesthetic Topical Lidocaine 4% cream to wound bed prior to debridement 3. Peri-wound Care: Skin Prep 4. Dressing Applied: Santyl Ointment 5. Secondary Dressing Applied Bordered Foam Dressing Electronic Signature(s) Signed: 11/13/2015 4:40:16 PM By: Alric Quan Entered By: Alric Quan on 11/13/2015 15:17:00 Linden, Dorella H. ([id number]) -------------------------------------------------------------------------------- Wound Assessment Details Patient Name: Fehring, Marquasha H. Date of Service: 11/13/2015 2:45 PM Medical Record Number: [id number] Patient Account Number: [id number] Date of Birth/Sex: 1945/10/07 (70 y.o. Female) Treating RN: Carolyne Fiscal, Debi Primary Care Physician: Paulita Cradle Other Clinician: Referring Physician: Paulita Cradle Treating Physician/Extender: Frann Rider in Treatment: 35 Wound Status Wound Number: 8 Primary Pressure Ulcer Etiology: Wound Location: Left Malleolus Wound Open Wounding Event: Gradually Appeared Status: Date Acquired: 09/12/2015 Comorbid Cataracts, Asthma, Hypertension, Type Weeks Of Treatment: 7 History: II Diabetes, Neuropathy, Received Clustered Wound: No Chemotherapy Photos Photo Uploaded By: Alric Quan on 11/13/2015 16:25:45 Wound Measurements Length: (cm) 0.6 Width: (cm) 1 Depth: (cm) 0.2 Area: (cm) 0.471 Volume: (cm) 0.094 % Reduction in Area: -902.1% %  Reduction in Volume: -944.4% Epithelialization: None Tunneling: No Undermining: No Wound Description Classification: Category/Stage II Foul Odor Af Diabetic Severity Earleen Newport): Grade 0 Wound Margin: Flat and Intact Exudate Amount: Medium Exudate Type: Serous Exudate Color:  amber ter Cleansing: No Wound Bed Granulation Amount: Medium (34-66%) Exposed Structure Necrotic Amount: Medium (34-66%) Fascia Exposed: No Necrotic Quality: Adherent Slough Fat Layer Exposed: No Clubb, Rosaleen H. ([id number]) Tendon Exposed: No Muscle Exposed: No Joint Exposed: No Bone Exposed: No Limited to Skin Breakdown Periwound Skin Texture Texture Color No Abnormalities Noted: No No Abnormalities Noted: No Callus: No Atrophie Blanche: No Crepitus: No Cyanosis: No Excoriation: No Ecchymosis: No Fluctuance: No Erythema: Yes Friable: No Erythema Location: Circumferential Induration: No Hemosiderin Staining: No Localized Edema: No Mottled: No Rash: No Pallor: No Scarring: No Rubor: No Moisture Temperature / Pain No Abnormalities Noted: No Temperature: No Abnormality Dry / Scaly: No Tenderness on Palpation: Yes Maceration: No Moist: Yes Wound Preparation Ulcer Cleansing: Rinsed/Irrigated with Saline Topical Anesthetic Applied: Other: Lidocaine 4%, Treatment Notes Wound #8 (Left Malleolus) 1. Cleansed with: Clean wound with Normal Saline 2. Anesthetic Topical Lidocaine 4% cream to wound bed prior to debridement 3. Peri-wound Care: Skin Prep 4. Dressing Applied: Santyl Ointment 5. Secondary Dressing Applied Bordered Foam Dressing Electronic Signature(s) Signed: 11/13/2015 4:40:16 PM By: Alric Quan Entered By: Alric Quan on 11/13/2015 15:21:44 Lia, Ramla H. ([id number]) -------------------------------------------------------------------------------- Wound Assessment Details Patient Name: Tripoli, Vandy H. Date of Service: 11/13/2015 2:45 PM Medical Record Number:  [id number] Patient Account Number: [id number] Date of Birth/Sex: [birth year] (70 y.o. Female) Treating RN: Carolyne Fiscal, Debi Primary Care Physician: Paulita Cradle Other Clinician: Referring Physician: Paulita Cradle Treating Physician/Extender: Frann Rider in Treatment: 35 Wound Status Wound Number: 9 Primary Pressure Ulcer Etiology: Wound Location: Left Ischial Tuberosity Wound Open Wounding Event: Gradually Appeared Status: Date Acquired: 09/12/2015 Comorbid Cataracts, Asthma, Hypertension, Type Weeks Of Treatment: 7 History: II Diabetes, Neuropathy, Received Clustered Wound: No Chemotherapy Photos Photo Uploaded By: Alric Quan on 11/13/2015 16:25:46 Wound Measurements Length: (cm) 0.6 Width: (cm) 0.6 Depth: (cm) 0.2 Area: (cm) 0.283 Volume: (cm) 0.057 % Reduction in Area: -44.4% % Reduction in Volume: -185% Epithelialization: None Tunneling: No Undermining: No Wound Description Classification: Category/Stage II Wound Margin: Flat and Intact Exudate Amount: Medium Exudate Type: Serous Exudate Color: amber Foul Odor After Cleansing: No Wound Bed Granulation Amount: None Present (0%) Exposed Structure Necrotic Amount: Large (67-100%) Fascia Exposed: No Necrotic Quality: Adherent Slough Fat Layer Exposed: No Tendon Exposed: No Mayorquin, Asako H. ([id number]) Muscle Exposed: No Joint Exposed: No Bone Exposed: No Limited to Skin Breakdown Periwound Skin Texture Texture Color No Abnormalities Noted: No No Abnormalities Noted: No Callus: No Atrophie Blanche: No Crepitus: No Cyanosis: No Excoriation: No Ecchymosis: No Fluctuance: No Erythema: No Friable: No Hemosiderin Staining: No Induration: No Mottled: No Localized Edema: No Pallor: No Rash: No Rubor: No Scarring: No Temperature / Pain Moisture Temperature: No Abnormality No Abnormalities Noted: No Dry / Scaly: No Maceration: No Moist: Yes Wound Preparation Ulcer  Cleansing: Rinsed/Irrigated with Saline Topical Anesthetic Applied: Other: lidocaine 4%, Treatment Notes Wound #9 (Left Ischial Tuberosity) 1. Cleansed with: Clean wound with Normal Saline 2. Anesthetic Topical Lidocaine 4% cream to wound bed prior to debridement 3. Peri-wound Care: Skin Prep 4. Dressing Applied: Prisma Ag 5. Secondary Dressing Applied Bordered Foam Dressing Electronic Signature(s) Signed: 11/13/2015 4:40:16 PM By: Alric Quan Entered By: Alric Quan on 11/13/2015 15:18:10 Tiley, Caydence H. ([id number]) -------------------------------------------------------------------------------- Vitals Details Patient Name: Mckenzie Fusi, Haylie H. Date of Service: 11/13/2015 2:45 PM Medical Record Number: [id number] Patient Account Number: [id number] Date of Birth/Sex: [birth year] (70 y.o. Female) Treating RN: Carolyne Fiscal, Debi Primary Care Physician: Paulita Cradle Other Clinician: Referring Physician: Paulita Cradle Treating Physician/Extender: Christin Fudge  Weeks in Treatment: 35 Vital Signs Time Taken: 14:46 Temperature (F): 98.2 Height (in): 65 Pulse (bpm): 122 Weight (lbs): 100 Respiratory Rate (breaths/min): 20 Body Mass Index (BMI): 16.6 Blood Pressure (mmHg): 128/78 Reference Range: 80 - 120 mg / dl Electronic Signature(s) Signed: 11/13/2015 4:40:16 PM By: Alric Quan Entered By: Alric Quan on 11/13/2015 14:49:32

## 2015-11-19 NOTE — Progress Notes (Signed)
GURTLER, Livi H. ([id number]) Visit Report for 11/13/2015 Chief Complaint Document Details Patient Name: [name], [name] Date of Service: 11/13/2015 2:45 PM Medical Record Number: [id number] Patient Account Number: [id number] Date of Birth/Sex: [birth year] (69 y.o. Female) Treating RN: Ahmed Prima Primary Care Physician: Paulita Cradle Other Clinician: Referring Physician: Paulita Cradle Treating Physician/Extender: Frann Rider in Treatment: 58 Information Obtained from: Patient Chief Complaint Patient presents to the wound care center for a consult due non healing wound. Ulcers on the right elbow and the right heel for about 1 month. Electronic Signature(s) Signed: 11/13/2015 4:48:29 PM By: Christin Fudge MD, FACS Entered By: Christin Fudge on 11/13/2015 16:48:28 Fishman, Cheynne H. ([id number]) -------------------------------------------------------------------------------- Debridement Details Patient Name: [name], [name] Date of Service: 11/13/2015 2:45 PM Medical Record Number: [id number] Patient Account Number: [id number] Date of Birth/Sex: May 01, 1946 (69 y.o. Female) Treating RN: Carolyne Fiscal, Debi Primary Care Physician: Paulita Cradle Other Clinician: Referring Physician: Paulita Cradle Treating Physician/Extender: Frann Rider in Treatment: 35 Debridement Performed for Wound #13 Left Calcaneus Assessment: Performed By: Physician Christin Fudge, MD Debridement: Debridement Pre-procedure Yes Verification/Time Out Taken: Start Time: 15:28 Pain Control: Other : lidocaine 4% cream Level: Skin/Subcutaneous Tissue Total Area Debrided (L x 0.7 (cm) x 0.8 (cm) = 0.56 (cm) W): Tissue and other Viable, Non-Viable, Exudate, Fibrin/Slough, Subcutaneous material debrided: Instrument: Curette Bleeding: Minimum Hemostasis Achieved: Pressure End Time: 15:29 Procedural Pain: 0 Post Procedural Pain: 0 Response to Treatment: Procedure was tolerated well Post  Debridement Measurements of Total Wound Length: (cm) 0.7 Stage: Category/Stage II Width: (cm) 0.8 Depth: (cm) 0.1 Volume: (cm) 0.044 Post Procedure Diagnosis Same as Pre-procedure Electronic Signature(s) Signed: 11/13/2015 4:47:51 PM By: Christin Fudge MD, FACS Signed: 11/18/2015 5:37:49 PM By: Alric Quan Previous Signature: 11/13/2015 4:40:16 PM Version By: Alric Quan Entered By: Christin Fudge on 11/13/2015 16:47:51 [name], [name] ([id number]) -------------------------------------------------------------------------------- Debridement Details Patient Name: [name], [name] Date of Service: 11/13/2015 2:45 PM Medical Record Number: [id number] Patient Account Number: [id number] Date of Birth/Sex: [birth year] (69 y.o. Female) Treating RN: Carolyne Fiscal, Debi Primary Care Physician: Paulita Cradle Other Clinician: Referring Physician: Paulita Cradle Treating Physician/Extender: Frann Rider in Treatment: 35 Debridement Performed for Wound #8 Left Malleolus Assessment: Performed By: Physician Christin Fudge, MD Debridement: Debridement Pre-procedure Yes Verification/Time Out Taken: Start Time: 15:27 Pain Control: Other : lidocaine 4% cream Level: Skin/Subcutaneous Tissue Total Area Debrided (L x 0.6 (cm) x 1 (cm) = 0.6 (cm) W): Tissue and other Viable, Non-Viable, Exudate, Fibrin/Slough, Subcutaneous material debrided: Instrument: Curette Bleeding: Minimum Hemostasis Achieved: Pressure End Time: 15:28 Procedural Pain: 0 Post Procedural Pain: 0 Response to Treatment: Procedure was tolerated well Post Debridement Measurements of Total Wound Length: (cm) 0.6 Stage: Category/Stage II Width: (cm) 1 Depth: (cm) 0.2 Volume: (cm) 0.094 Post Procedure Diagnosis Same as Pre-procedure Electronic Signature(s) Signed: 11/13/2015 4:48:22 PM By: Christin Fudge MD, FACS Signed: 11/18/2015 5:37:49 PM By: Alric Quan Previous Signature: 11/13/2015 4:48:03 PM Version  By: Christin Fudge MD, FACS Previous Signature: 11/13/2015 4:40:16 PM Version By: Alric Quan Entered By: Christin Fudge on 11/13/2015 16:48:22 [name], [name] ([id number]) -------------------------------------------------------------------------------- HPI Details Patient Name: [name], [name] Date of Service: 11/13/2015 2:45 PM Medical Record Number: [id number] Patient Account Number: [id number] Date of Birth/Sex: [birth year] (69 y.o. Female) Treating RN: Carolyne Fiscal, Debi Primary Care Physician: Paulita Cradle Other Clinician: Referring Physician: Paulita Cradle Treating Physician/Extender: Frann Rider in Treatment: 35 History of Present Illness Location: Ulceration on the right heel and the right elbow. Quality: Patient reports experiencing a dull pain  to affected area(s). Severity: Patient states wound (s) are getting better. Duration: Patient has had the wound for < 4 weeks prior to presenting for treatment Timing: Pain in wound is Intermittent (comes and goes Context: The wound appeared gradually over time Modifying Factors: Consults to this date include:Augmentin and Bactrim and also some heel protection with duoderm Associated Signs and Symptoms: Patient reports having difficulty standing for long periods. HPI Description: 70 year old female with history of peripheral neuropathy, history of diet controlled diabetes mellitus type 2, history of alcoholism here for wound consult sent by her PCP Dr. Sherilyn Cooter. She has pressure ulcers at her right elbow, bilateral heels. Plain films of right calcaneus without acute bony process. Patient started by PCP on Augmentin, Bactrim as per orders, DuoDerm dressings applied - reports some improvement in her ulcer since last seen. Denies fever, chills, nausea, vomiting, diarrhea. She had a right humerus fracture in the middle of May and has had no surgery and arm is in a sling. She is also been laying in the bed for quite a while. Past  medical history significant for essential hypertension, osteoporosis, peripheral neuropathy, alcoholism, ataxia, personal history of breast cancer treated with surgery chemotherapy and radiation and this was done in December 2010. she is also status post laparoscopic cholecystectomy, pilonidal cyst excision, subcutaneous port placement, partial mastectomy on the left side, skin cancer removal. 03/21/2015 -- she says overall she's been doing better and continues to smoke about 15 cigarettes a day. 03/21/2015 - her orthopedic doctor has said she may require surgery for her right humerus fracture. 04/04/2015 -- her orthopedic surgery has been scheduled for August 11. 04/18/2015 -- she is doing fine as far as her elbow and her right heel goes but she has developed some redness over prominence on her thoracic spine and wanted me to take a look at this. 05/02/2015 -- she had her surgery done and now is in a sling and support. Her back has developed a pressure injury of undetermined stage. She seems to be in better spirits. 05/16/2015 -- last week her right heel was looking great and we had healed it out, but she has not been offloading appropriately and has a deep tissue injury on the right heel again. The area on her right elbow has opened out with slough and the area in the thoracic spine is also getting worse. 05/30/2015 -- she has developed 2 new ulcerations one on her left ischial tuberosity and one on the sacral region. She is still working on getting up smoking but is also unable to take her vitamins as she says she develops a diarrhea when she takes vitamins. She has increased her intake of proteins. 06/06/2015 -- the patient's husband manages to get her a low air loss mattress with initiating pressure but [name], [name] ([id number]) did not know how to use it exactly and the patient was not happy about using it. Overall she says she's been feeling better. 07/11/2015 -- . Discussed a surgical  opinion for debridement and application of a wound VAC, 2 weeks ago but the patient has been reluctant to get a surgical opinion as she wants to avoid surgery. 07/18/2015 -- they have an appointment to see Dr. Tamala Julian this coming Wednesday. 07/29/2015 -- they saw Dr. Tamala Julian in his office and he did a debridement of the wound and removed significant amount of slough. This is in addition to the debridement I had done previously on Friday where a large amount of the eschar was removed. He  did not recommend the application of wound VAC. Addendum : Dr. Thompson Caul note was reviewed via EPIC and details noted as above. 08/05/2015 -- over the last week she has developed a another pressure injury to her right hip and has had significant discoloration of the skin and an eschar there. 08/15/2015 -- she is still smoking about a pack of cigarettes a day and does not seem to want to quit. They have not been able to talk to the vendor regarding the air mattress and I will ask them to get in touch again. She is reluctant to take vitamins and does admit her nutrition is poor. 08/22/2015 -- she has been unable to tolerate her vitamins and continues to smoke. They're working on getting a low air loss mattress and have been speaking with the vendor. 09/19/2015 -- since her last visit and was admitted to the hospital between 09/09/2015 and 09/16/2015. She was thought to have an active sepsis possibly from one of her decubitus ulcers but nothing was grown except for an MSSA from her thoracic spine region. CT of this area did not show any osteomyelitis. Been given IV antibiotics which included vancomycin and Zosyn in the hospital under the care of Dr. [name] the ID specialist she was sent home on oral Keflex 500 mg 3 times a day for 2 weeks. he will consider an MRI in the outpatient setting to completely rule out osteomyelitis of the spine. 10/03/2015 --readmitted to hospital on 09/23/2015 for general debility, lethargy  and possible sepsis and workup was in progress. Seen by Dr. [name] and he would also like a workup for underlying malignancy as sheos had previous ultrasounds of the breasts suggesting findings of concern. Workup done so far does not suggest deep bony infection. She was treated for a pneumonia and received Zosyn and vancomycin. She had been recommended Cipro 500 twice a day and doxycycline 100 mg twice a day once she was to be [place] home. The antibiotics were to be stopped on 10/07/2015. 10/17/2015 -- patient is feeling much better and has been eating better and doing her offloading as much as possible. 10/23/2015 -- she has an appointment to see Dr. [name] tomorrow but other than that has been doing as much as possible with offloading and increasing her diet. 11/07/2015 -- she has not been here to see [place] for 2 weeks and at the present time continues to try and eat better, work on off loading and is using the air mattress at night. She saw her PCP yesterday and she has put her back on a fentanyl patch. Electronic Signature(s) Signed: 11/13/2015 4:48:33 PM By: Christin Fudge MD, FACS Entered By: Christin Fudge on 11/13/2015 16:48:32 [name], [name] ([id number]) -------------------------------------------------------------------------------- Physical Exam Details Patient Name: [name], [name] Date of Service: 11/13/2015 2:45 PM Medical Record Number: [id number] Patient Account Number: [id number] Date of Birth/Sex: Sep 11, 1946 (69 y.o. Female) Treating RN: Ahmed Prima Primary Care Physician: Paulita Cradle Other Clinician: Referring Physician: Paulita Cradle Treating Physician/Extender: Frann Rider in Treatment: 35 Constitutional . Pulse regular. Respirations normal and unlabored. Afebrile. . Eyes Nonicteric. Reactive to light. Ears, Nose, Mouth, and Throat Lips, teeth, and gums WNL.Marland Kitchen Moist mucosa without lesions. Neck supple and nontender. No palpable  supraclavicular or cervical adenopathy. Normal sized without goiter. Respiratory WNL. No retractions.. Cardiovascular Pedal Pulses WNL. No clubbing, cyanosis or edema. Lymphatic No adneopathy. No adenopathy. No adenopathy. Musculoskeletal Adexa without tenderness or enlargement.. Digits and nails w/o clubbing, cyanosis, infection, petechiae, ischemia, or inflammatory conditions.. Integumentary (  Hair, Skin) No suspicious lesions. No crepitus or fluctuance. No peri-wound warmth or erythema. No masses.Marland Kitchen Psychiatric Judgement and insight Intact.. No evidence of depression, anxiety, or agitation.. Notes both the wounds on the left lateral heel had to be sharply debrided with a curette. The rest of the wounds were partially covered with slough and some of them had good granulation tissue and were cleant with saline Electronic Signature(s) Signed: 11/13/2015 4:49:30 PM By: Christin Fudge MD, FACS Entered By: Christin Fudge on 11/13/2015 16:49:29 [name], [name] ([id number]) -------------------------------------------------------------------------------- Physician Orders Details Patient Name: [name], Savaya H. Date of Service: 11/13/2015 2:45 PM Medical Record Number: [id number] Patient Account Number: [id number] Date of Birth/Sex: [birth year] (69 y.o. Female) Treating RN: Carolyne Fiscal, Debi Primary Care Physician: Paulita Cradle Other Clinician: Referring Physician: Paulita Cradle Treating Physician/Extender: Frann Rider in Treatment: 58 Verbal / Phone Orders: Yes Clinician: Carolyne Fiscal, Debi Read Back and Verified: Yes Diagnosis Coding Wound Cleansing Wound #10 Right,Medial Foot o Clean wound with Normal Saline. o Cleanse wound with mild soap and water Wound #11 Right,Distal Calcaneus o Clean wound with Normal Saline. Wound #12 Right,Proximal Calcaneus o Clean wound with Normal Saline. o Cleanse wound with mild soap and water Wound #13 Left Calcaneus o Clean wound  with Normal Saline. o Cleanse wound with mild soap and water Wound #3 Midline Back o Clean wound with Normal Saline. o Cleanse wound with mild soap and water Wound #7 Right Ischial Tuberosity o Clean wound with Normal Saline. o Cleanse wound with mild soap and water Wound #8 Left Malleolus o Clean wound with Normal Saline. o Cleanse wound with mild soap and water Wound #9 Left Ischial Tuberosity o Clean wound with Normal Saline. o Cleanse wound with mild soap and water Anesthetic Wound #10 Right,Medial Foot o Topical Lidocaine 4% cream applied to wound bed prior to debridement Wound #11 Right,Distal Calcaneus o Topical Lidocaine 4% cream applied to wound bed prior to debridement Yeates, Pollyann H. ([id number]) Wound #12 Right,Proximal Calcaneus o Topical Lidocaine 4% cream applied to wound bed prior to debridement Wound #13 Left Calcaneus o Topical Lidocaine 4% cream applied to wound bed prior to debridement Wound #3 Midline Back o Topical Lidocaine 4% cream applied to wound bed prior to debridement Wound #7 Right Ischial Tuberosity o Topical Lidocaine 4% cream applied to wound bed prior to debridement Wound #8 Left Malleolus o Topical Lidocaine 4% cream applied to wound bed prior to debridement Wound #9 Left Ischial Tuberosity o Topical Lidocaine 4% cream applied to wound bed prior to debridement Skin Barriers/Peri-Wound Care Wound #10 Right,Medial Foot o Skin Prep Wound #11 Right,Distal Calcaneus o Skin Prep Wound #12 Right,Proximal Calcaneus o Skin Prep Wound #13 Left Calcaneus o Skin Prep Wound #3 Midline Back o Skin Prep Wound #7 Right Ischial Tuberosity o Skin Prep Wound #8 Left Malleolus o Skin Prep Wound #9 Left Ischial Tuberosity o Skin Prep Primary Wound Dressing Wound #10 Right,Medial Foot o Prisma Ag - reconstitute with normal saline Wound #11 Right,Distal Calcaneus [name], Shahla H. ([id number]) o Santyl  Ointment Wound #12 Right,Proximal Calcaneus o Prisma Ag - reconstitute with normal saline Wound #13 Left Calcaneus o Santyl Ointment Wound #3 Midline Back o Santyl Ointment Wound #7 Right Ischial Tuberosity o Santyl Ointment Wound #8 Left Malleolus o Santyl Ointment Wound #9 Left Ischial Tuberosity o Prisma Ag - reconstitute with normal saline Secondary Dressing Wound #10 Right,Medial Foot o Boardered Foam Dressing Wound #11 Right,Distal Calcaneus o Boardered Foam Dressing Wound #12 Right,Proximal Calcaneus o Boardered  Foam Dressing Wound #13 Left Calcaneus o Boardered Foam Dressing Wound #3 Midline Back o Boardered Foam Dressing Wound #7 Right Ischial Tuberosity o Boardered Foam Dressing Wound #8 Left Malleolus o Boardered Foam Dressing Wound #9 Left Ischial Tuberosity o Boardered Foam Dressing Dressing Change Frequency Wound #10 Right,Medial Foot o Change dressing every other day. Cozine, Xyla H. ([id number]) Wound #11 Right,Distal Calcaneus o Change dressing every day. Wound #12 Right,Proximal Calcaneus o Change dressing every other day. Wound #13 Left Calcaneus o Change dressing every other day. Wound #3 Midline Back o Change dressing every day. Wound #7 Right Ischial Tuberosity o Change dressing every other day. Wound #8 Left Malleolus o Change dressing every day. Wound #9 Left Ischial Tuberosity o Change dressing every other day. Follow-up Appointments Wound #10 Right,Medial Foot o Return Appointment in 1 week. Wound #11 Right,Distal Calcaneus o Return Appointment in 1 week. Wound #12 Right,Proximal Calcaneus o Return Appointment in 1 week. Wound #13 Left Calcaneus o Return Appointment in 1 week. Wound #3 Midline Back o Return Appointment in 1 week. Wound #7 Right Ischial Tuberosity o Return Appointment in 1 week. Wound #8 Left Malleolus o Return Appointment in 1 week. Wound #9 Left Ischial  Tuberosity o Return Appointment in 1 week. Off-Loading Wound #10 Right,Medial Foot Rorabaugh, Breckin H. ([id number]) o Turn and reposition every 2 hours o Other: - float heels while in the bed Wound #11 Right,Distal Calcaneus o Turn and reposition every 2 hours o Other: - float heels while in the bed Wound #12 Right,Proximal Calcaneus o Turn and reposition every 2 hours o Other: - float heels while in the bed Wound #13 Left Calcaneus o Turn and reposition every 2 hours o Other: - float heels while in the bed Wound #3 Midline Back o Turn and reposition every 2 hours o Other: - float heels while in the bed Wound #7 Right Ischial Tuberosity o Turn and reposition every 2 hours o Other: - float heels while in the bed Wound #8 Left Malleolus o Turn and reposition every 2 hours o Other: - float heels while in the bed Wound #9 Left Ischial Tuberosity o Turn and reposition every 2 hours o Other: - float heels while in the bed Home Health Wound #10 [place] Visits - [place] Nurse may visit PRN to address patientos wound care needs. o FACE TO FACE ENCOUNTER: MEDICARE and MEDICAID PATIENTS: I certify that this patient is under my care and that I had a face-to-face encounter that meets the physician face-to-face encounter requirements with this patient on this date. The encounter with the patient was in whole or in part for the following MEDICAL CONDITION: (primary reason for [place]) MEDICAL NECESSITY: I certify, that based on my findings, NURSING services are a medically necessary home health service. HOME BOUND STATUS: I certify that my clinical findings support that this patient is homebound (i.e., Due to illness or injury, pt requires aid of supportive devices such as crutches, cane, wheelchairs, walkers, the use of special transportation or the assistance of another person to leave their place of  residence. There is a normal inability to leave the home and doing so requires considerable and taxing effort. Other absences are for medical reasons / religious services and are infrequent or of short duration when for other reasons). Gensel, Cataleyah H. ([id number]) o If current dressing causes regression in wound condition, may D/C ordered dressing product/s and apply Normal Saline Moist Dressing daily until next  [facility] / Other MD appointment. [place] of regression in wound condition at [phone]. o Please direct any NON-WOUND related issues/requests for orders to patient's Primary Care Physician Wound #11 [place] Visits - [place] Nurse may visit PRN to address patientos wound care needs. o FACE TO FACE ENCOUNTER: MEDICARE and MEDICAID PATIENTS: I certify that this patient is under my care and that I had a face-to-face encounter that meets the physician face-to-face encounter requirements with this patient on this date. The encounter with the patient was in whole or in part for the following MEDICAL CONDITION: (primary reason for [place]) MEDICAL NECESSITY: I certify, that based on my findings, NURSING services are a medically necessary home health service. HOME BOUND STATUS: I certify that my clinical findings support that this patient is homebound (i.e., Due to illness or injury, pt requires aid of supportive devices such as crutches, cane, wheelchairs, walkers, the use of special transportation or the assistance of another person to leave their place of residence. There is a normal inability to leave the home and doing so requires considerable and taxing effort. Other absences are for medical reasons / religious services and are infrequent or of short duration when for other reasons). o If current dressing causes regression in wound condition, may D/C ordered dressing product/s and  apply Normal Saline Moist Dressing daily until next [place] / Other MD appointment. [place] of regression in wound condition at [phone]. o Please direct any NON-WOUND related issues/requests for orders to patient's Primary Care Physician Wound #12 Right,Proximal Calcaneus o [place] Visits - [place] Nurse may visit PRN to address patientos wound care needs. o FACE TO FACE ENCOUNTER: MEDICARE and MEDICAID PATIENTS: I certify that this patient is under my care and that I had a face-to-face encounter that meets the physician face-to-face encounter requirements with this patient on this date. The encounter with the patient was in whole or in part for the following MEDICAL CONDITION: (primary reason for [place]) MEDICAL NECESSITY: I certify, that based on my findings, NURSING services are a medically necessary home health service. HOME BOUND STATUS: I certify that my clinical findings support that this patient is homebound (i.e., Due to illness or injury, pt requires aid of supportive devices such as crutches, cane, wheelchairs, walkers, the use of special transportation or the assistance of another person to leave their place of residence. There is a normal inability to leave the home and doing so requires considerable and taxing effort. Other absences are for medical reasons / religious services and are infrequent or of short duration when for other reasons). o If current dressing causes regression in wound condition, may D/C ordered dressing product/s and apply Normal Saline Moist Dressing daily until next [place] / Other MD appointment. [place] of regression in wound condition at [phone]. o Please direct any NON-WOUND related issues/requests for orders to patient's Primary Care Physician Wound #13 [place] Visits - Irie Satchell, Avacyn H.  ([id number]) o [place] Nurse may visit PRN to address patientos wound care needs. o FACE TO FACE ENCOUNTER: MEDICARE and MEDICAID PATIENTS: I certify that this patient is under my care and that I had a face-to-face encounter that meets the physician face-to-face encounter requirements with this patient on this date. The encounter with the patient was in whole or in part for the following  MEDICAL CONDITION: (primary reason for Home Healthcare) MEDICAL NECESSITY: I certify, that based on my findings, NURSING services are a medically necessary home health service. HOME BOUND STATUS: I certify that my clinical findings support that this patient is homebound (i.e., Due to illness or injury, pt requires aid of supportive devices such as crutches, cane, wheelchairs, walkers, the use of special transportation or the assistance of another person to leave their place of residence. There is a normal inability to leave the home and doing so requires considerable and taxing effort. Other absences are for medical reasons / religious services and are infrequent or of short duration when for other reasons). o If current dressing causes regression in wound condition, may D/C ordered dressing product/s and apply Normal Saline Moist Dressing daily until next [place] / Other MD appointment. [place] of regression in wound condition at [phone]. o Please direct any NON-WOUND related issues/requests for orders to patient's Primary Care Physician Wound #3 Midline Back o [place] Visits - [place] Nurse may visit PRN to address patientos wound care needs. o FACE TO FACE ENCOUNTER: MEDICARE and MEDICAID PATIENTS: I certify that this patient is under my care and that I had a face-to-face encounter that meets the physician face-to-face encounter requirements with this patient on this date. The encounter with the patient was in whole or in  part for the following MEDICAL CONDITION: (primary reason for [place]) MEDICAL NECESSITY: I certify, that based on my findings, NURSING services are a medically necessary home health service. HOME BOUND STATUS: I certify that my clinical findings support that this patient is homebound (i.e., Due to illness or injury, pt requires aid of supportive devices such as crutches, cane, wheelchairs, walkers, the use of special transportation or the assistance of another person to leave their place of residence. There is a normal inability to leave the home and doing so requires considerable and taxing effort. Other absences are for medical reasons / religious services and are infrequent or of short duration when for other reasons). o If current dressing causes regression in wound condition, may D/C ordered dressing product/s and apply Normal Saline Moist Dressing daily until next [place] / Other MD appointment. [place] of regression in wound condition at [phone]. o Please direct any NON-WOUND related issues/requests for orders to patient's Primary Care Physician Wound #7 Right Ischial [place] Visits - [place] Nurse may visit PRN to address patientos wound care needs. o FACE TO FACE ENCOUNTER: MEDICARE and MEDICAID PATIENTS: I certify that this patient is under my care and that I had a face-to-face encounter that meets the physician face-to-face encounter requirements with this patient on this date. The encounter with the patient was in whole or in part for the following MEDICAL CONDITION: (primary reason for [place]) MEDICAL NECESSITY: I certify, that based on my findings, NURSING services are a medically necessary home health service. HOME BOUND STATUS: I certify that my clinical findings support that this patient is homebound (i.e., Due to illness or injury, pt requires aid of Guess, Charleen H.  ([id number]) supportive devices such as crutches, cane, wheelchairs, walkers, the use of special transportation or the assistance of another person to leave their place of residence. There is a normal inability to leave the home and doing so requires considerable and taxing effort. Other absences are for medical reasons / religious services and are infrequent or of short  duration when for other reasons). o If current dressing causes regression in wound condition, may D/C ordered dressing product/s and apply Normal Saline Moist Dressing daily until next [place] / Other MD appointment. [place] of regression in wound condition at [phone]. o Please direct any NON-WOUND related issues/requests for orders to patient's Primary Care Physician Wound #8 Left [place] Visits - [place] Nurse may visit PRN to address patientos wound care needs. o FACE TO FACE ENCOUNTER: MEDICARE and MEDICAID PATIENTS: I certify that this patient is under my care and that I had a face-to-face encounter that meets the physician face-to-face encounter requirements with this patient on this date. The encounter with the patient was in whole or in part for the following MEDICAL CONDITION: (primary reason for [place]) MEDICAL NECESSITY: I certify, that based on my findings, NURSING services are a medically necessary home health service. HOME BOUND STATUS: I certify that my clinical findings support that this patient is homebound (i.e., Due to illness or injury, pt requires aid of supportive devices such as crutches, cane, wheelchairs, walkers, the use of special transportation or the assistance of another person to leave their place of residence. There is a normal inability to leave the home and doing so requires considerable and taxing effort. Other absences are for medical reasons / religious services and are infrequent or of short  duration when for other reasons). o If current dressing causes regression in wound condition, may D/C ordered dressing product/s and apply Normal Saline Moist Dressing daily until next [place] / Other MD appointment. [place] of regression in wound condition at [phone]. o Please direct any NON-WOUND related issues/requests for orders to patient's Primary Care Physician Wound #9 Left Ischial [place] Visits - [place] Nurse may visit PRN to address patientos wound care needs. o FACE TO FACE ENCOUNTER: MEDICARE and MEDICAID PATIENTS: I certify that this patient is under my care and that I had a face-to-face encounter that meets the physician face-to-face encounter requirements with this patient on this date. The encounter with the patient was in whole or in part for the following MEDICAL CONDITION: (primary reason for [place]) MEDICAL NECESSITY: I certify, that based on my findings, NURSING services are a medically necessary home health service. HOME BOUND STATUS: I certify that my clinical findings support that this patient is homebound (i.e., Due to illness or injury, pt requires aid of supportive devices such as crutches, cane, wheelchairs, walkers, the use of special transportation or the assistance of another person to leave their place of residence. There is a normal inability to leave the home and doing so requires considerable and taxing effort. Other absences are for medical reasons / religious services and are infrequent or of short duration when for other reasons). o If current dressing causes regression in wound condition, may D/C ordered dressing product/s and apply Normal Saline Moist Dressing daily until next [place] / Other MD appointment. [place] of regression in wound condition at [phone]. Canoy, Analyah H. ([id number]) o Please direct any  NON-WOUND related issues/requests for orders to patient's Primary Care Physician Electronic Signature(s) Signed: 11/13/2015 4:40:16 PM By: Alric Quan Signed: 11/13/2015 4:53:38 PM By: Christin Fudge MD, FACS Entered By: Alric Quan on 11/13/2015 15:36:26 [name], [name] ([id number]) -------------------------------------------------------------------------------- Problem List Details Patient Name: Paulo, Yzabelle H. Date of Service: 11/13/2015 2:45 PM Medical Record Number:  [id number] Patient Account Number: [id number] Date of Birth/Sex: 1946/08/30 (69 y.o. Female) Treating RN: Ahmed Prima Primary Care Physician: Paulita Cradle Other Clinician: Referring Physician: Paulita Cradle Treating Physician/Extender: Frann Rider in Treatment: 34 Active Problems ICD-10 Encounter Code Description Active Date Diagnosis E11.621 Type 2 diabetes mellitus with foot ulcer 03/13/2015 Yes L89.613 Pressure ulcer of right heel, stage 3 03/13/2015 Yes F17.218 Nicotine dependence, cigarettes, with other nicotine- 03/13/2015 Yes induced disorders L89.100 Pressure ulcer of unspecified part of back, unstageable 05/02/2015 Yes L89.213 Pressure ulcer of right hip, stage 3 08/05/2015 Yes Inactive Problems Resolved Problems ICD-10 Code Description Active Date Resolved Date L89.013 Pressure ulcer of right elbow, stage 3 03/13/2015 03/13/2015 [id number] Pressure ulcer of left hip, stage 2 05/30/2015 05/30/2015 L89.153 Pressure ulcer of sacral region, stage 3 05/30/2015 05/30/2015 Electronic Signature(s) [name], Nahiara Lemmie Evens ([id number]) Signed: 11/13/2015 4:47:43 PM By: Christin Fudge MD, FACS Entered By: Christin Fudge on 11/13/2015 16:47:42 Ambrocio, Corning. ([id number]) -------------------------------------------------------------------------------- Progress Note Details Patient Name: Akram, Kesa H. Date of Service: 11/13/2015 2:45 PM Medical Record Number: [id number] Patient Account Number: [id number] Date  of Birth/Sex: [birth year] (69 y.o. Female) Treating RN: Ahmed Prima Primary Care Physician: Paulita Cradle Other Clinician: Referring Physician: Paulita Cradle Treating Physician/Extender: Frann Rider in Treatment: 25 Subjective Chief Complaint Information obtained from Patient Patient presents to the wound care center for a consult due non healing wound. Ulcers on the right elbow and the right heel for about 1 month. History of Present Illness (HPI) The following HPI elements were documented for the patient's wound: Location: Ulceration on the right heel and the right elbow. Quality: Patient reports experiencing a dull pain to affected area(s). Severity: Patient states wound (s) are getting better. Duration: Patient has had the wound for < 4 weeks prior to presenting for treatment Timing: Pain in wound is Intermittent (comes and goes Context: The wound appeared gradually over time Modifying Factors: Consults to this date include:Augmentin and Bactrim and also some heel protection with duoderm Associated Signs and Symptoms: Patient reports having difficulty standing for long periods. 69 year old female with history of peripheral neuropathy, history of diet controlled diabetes mellitus type 2, history of alcoholism here for wound consult sent by her PCP Dr. Sherilyn Cooter. She has pressure ulcers at her right elbow, bilateral heels. Plain films of right calcaneus without acute bony process. Patient started by PCP on Augmentin, Bactrim as per orders, DuoDerm dressings applied - reports some improvement in her ulcer since last seen. Denies fever, chills, nausea, vomiting, diarrhea. She had a right humerus fracture in the middle of May and has had no surgery and arm is in a sling. She is also been laying in the bed for quite a while. Past medical history significant for essential hypertension, osteoporosis, peripheral neuropathy, alcoholism, ataxia, personal history of breast  cancer treated with surgery chemotherapy and radiation and this was done in December 2010. she is also status post laparoscopic cholecystectomy, pilonidal cyst excision, subcutaneous port placement, partial mastectomy on the left side, skin cancer removal. 03/21/2015 -- she says overall she's been doing better and continues to smoke about 15 cigarettes a day. 03/21/2015 - her orthopedic doctor has said she may require surgery for her right humerus fracture. 04/04/2015 -- her orthopedic surgery has been scheduled for August 11. 04/18/2015 -- she is doing fine as far as her elbow and her right heel goes but she has developed some redness over prominence on her thoracic spine and wanted me to take a look at this. Paisley, Anjelique  Lemmie Evens ([id number]) 05/02/2015 -- she had her surgery done and now is in a sling and support. Her back has developed a pressure injury of undetermined stage. She seems to be in better spirits. 05/16/2015 -- last week her right heel was looking great and we had healed it out, but she has not been offloading appropriately and has a deep tissue injury on the right heel again. The area on her right elbow has opened out with slough and the area in the thoracic spine is also getting worse. 05/30/2015 -- she has developed 2 new ulcerations one on her left ischial tuberosity and one on the sacral region. She is still working on getting up smoking but is also unable to take her vitamins as she says she develops a diarrhea when she takes vitamins. She has increased her intake of proteins. 06/06/2015 -- the patient's husband manages to get her a low air loss mattress with initiating pressure but did not know how to use it exactly and the patient was not happy about using it. Overall she says she's been feeling better. 07/11/2015 -- . Discussed a surgical opinion for debridement and application of a wound VAC, 2 weeks ago but the patient has been reluctant to get a surgical opinion as she  wants to avoid surgery. 07/18/2015 -- they have an appointment to see Dr. Tamala Julian this coming Wednesday. 07/29/2015 -- they saw Dr. Tamala Julian in his office and he did a debridement of the wound and removed significant amount of slough. This is in addition to the debridement I had done previously on Friday where a large amount of the eschar was removed. He did not recommend the application of wound VAC. Addendum : Dr. Thompson Caul note was reviewed via EPIC and details noted as above. 08/05/2015 -- over the last week she has developed a another pressure injury to her right hip and has had significant discoloration of the skin and an eschar there. 08/15/2015 -- she is still smoking about a pack of cigarettes a day and does not seem to want to quit. They have not been able to talk to the vendor regarding the air mattress and I will ask them to get in touch again. She is reluctant to take vitamins and does admit her nutrition is poor. 08/22/2015 -- she has been unable to tolerate her vitamins and continues to smoke. They're working on getting a low air loss mattress and have been speaking with the vendor. 09/19/2015 -- since her last visit and was admitted to the hospital between 09/09/2015 and 09/16/2015. She was thought to have an active sepsis possibly from one of her decubitus ulcers but nothing was grown except for an MSSA from her thoracic spine region. CT of this area did not show any osteomyelitis. Been given IV antibiotics which included vancomycin and Zosyn in the hospital under the care of Dr. [name] the ID specialist she was sent home on oral Keflex 500 mg 3 times a day for 2 weeks. he will consider an MRI in the outpatient setting to completely rule out osteomyelitis of the spine. 10/03/2015 --readmitted to hospital on 09/23/2015 for general debility, lethargy and possible sepsis and workup was in progress. Seen by Dr. [name] and he would also like a workup for underlying malignancy as  she s had previous ultrasounds of the breasts suggesting findings of concern. Workup done so far does not suggest deep bony infection. She was treated for a pneumonia and received Zosyn and vancomycin. She had been recommended Cipro 500  twice a day and doxycycline 100 mg twice a day once she was to be [place] home. The antibiotics were to be stopped on 10/07/2015. 10/17/2015 -- patient is feeling much better and has been eating better and doing her offloading as much as possible. 10/23/2015 -- she has an appointment to see Dr. [name] tomorrow but other than that has been doing as much as possible with offloading and increasing her diet. 11/07/2015 -- she has not been here to see [place] for 2 weeks and at the present time continues to try and eat better, work on off loading and is using the air mattress at night. She saw her PCP yesterday and she has put her back on a fentanyl patch. Dom, Valory H. ([id number]) Objective Constitutional Pulse regular. Respirations normal and unlabored. Afebrile. Vitals Time Taken: 2:46 PM, Height: 65 in, Weight: 100 lbs, BMI: 16.6, Temperature: 98.2 F, Pulse: 122 bpm, Respiratory Rate: 20 breaths/min, Blood Pressure: 128/78 mmHg. Eyes Nonicteric. Reactive to light. Ears, Nose, Mouth, and Throat Lips, teeth, and gums WNL.Marland Kitchen Moist mucosa without lesions. Neck supple and nontender. No palpable supraclavicular or cervical adenopathy. Normal sized without goiter. Respiratory WNL. No retractions.. Cardiovascular Pedal Pulses WNL. No clubbing, cyanosis or edema. Lymphatic No adneopathy. No adenopathy. No adenopathy. Musculoskeletal Adexa without tenderness or enlargement.. Digits and nails w/o clubbing, cyanosis, infection, petechiae, ischemia, or inflammatory conditions.Marland Kitchen Psychiatric Judgement and insight Intact.. No evidence of depression, anxiety, or agitation.. General Notes: both the wounds on the left lateral heel had to be sharply debrided with a  curette. The rest of the wounds were partially covered with slough and some of them had good granulation tissue and were cleant with saline Integumentary (Hair, Skin) No suspicious lesions. No crepitus or fluctuance. No peri-wound warmth or erythema. No masses.. Wound #10 status is Open. Original cause of wound was Gradually Appeared. The wound is located on the Right,Medial Foot. The wound measures 0.4cm length x 0.5cm width x 0.1cm depth; 0.157cm^2 area and 0.016cm^3 volume. The wound is limited to skin breakdown. There is no tunneling or undermining noted. There is a medium amount of serous drainage noted. The wound margin is flat and intact. There is no granulation within the wound bed. There is a large (67-100%) amount of necrotic tissue within the wound Oborn, Josefa H. ([id number]) bed including Adherent Slough. The periwound skin appearance exhibited: Moist, Erythema. The periwound skin appearance did not exhibit: Callus, Crepitus, Excoriation, Fluctuance, Friable, Induration, Localized Edema, Rash, Scarring, Dry/Scaly. The surrounding wound skin color is noted with erythema which is circumferential. Periwound temperature was noted as No Abnormality. The periwound has tenderness on palpation. Wound #11 status is Open. Original cause of wound was Pressure Injury. The wound is located on the Right,Distal Calcaneus. The wound measures 2.6cm length x 5.5cm width x 0.2cm depth; 11.231cm^2 area and 2.246cm^3 volume. The wound is limited to skin breakdown. There is no tunneling or undermining noted. There is a large amount of serous drainage noted. The wound margin is flat and intact. There is small (1-33%) pink granulation within the wound bed. There is a large (67-100%) amount of necrotic tissue within the wound bed including Adherent Slough. The periwound skin appearance exhibited: Maceration, Moist. The periwound skin appearance did not exhibit: Callus, Crepitus, Excoriation, Fluctuance,  Friable, Induration, Localized Edema, Rash, Scarring, Dry/Scaly, Atrophie Blanche, Cyanosis, Ecchymosis, Hemosiderin Staining, Mottled, Pallor, Rubor, Erythema. Periwound temperature was noted as No Abnormality. The periwound has tenderness on palpation. Wound #12 status is Open. Original cause of wound  was Pressure Injury. The wound is located on the Right,Proximal Calcaneus. The wound measures 0.9cm length x 1cm width x 0.1cm depth; 0.707cm^2 area and 0.071cm^3 volume. The wound is limited to skin breakdown. There is no tunneling or undermining noted. There is a large amount of serous drainage noted. The wound margin is flat and intact. There is medium (34-66%) pink granulation within the wound bed. There is a medium (34-66%) amount of necrotic tissue within the wound bed including Adherent Slough. The periwound skin appearance exhibited: Moist. Periwound temperature was noted as No Abnormality. The periwound has tenderness on palpation. Wound #13 status is Open. Original cause of wound was Pressure Injury. The wound is located on the Left Calcaneus. The wound measures 0.7cm length x 0.8cm width x 0.1cm depth; 0.44cm^2 area and 0.044cm^3 volume. The wound is limited to skin breakdown. There is no tunneling or undermining noted. There is a large amount of serous drainage noted. The wound margin is flat and intact. There is medium (34-66%) red, pink granulation within the wound bed. There is a medium (34-66%) amount of necrotic tissue within the wound bed including Adherent Slough. The periwound skin appearance exhibited: Maceration, Moist. Periwound temperature was noted as No Abnormality. The periwound has tenderness on palpation. Wound #3 status is Open. Original cause of wound was Pressure Injury. The wound is located on the Midline Back. The wound measures 1.4cm length x 2.8cm width x 0.1cm depth; 3.079cm^2 area and 0.308cm^3 volume. The wound is limited to skin breakdown. There is no  undermining noted, however, there is tunneling at 12:00 with a maximum distance of 1.4cm. There is a large amount of serosanguineous drainage noted. The wound margin is distinct with the outline attached to the wound base. There is medium (34-66%) red, pink granulation within the wound bed. There is a medium (34-66%) amount of necrotic tissue within the wound bed including Adherent Slough. The periwound skin appearance exhibited: Moist. The periwound skin appearance did not exhibit: Callus, Crepitus, Excoriation, Fluctuance, Friable, Induration, Localized Edema, Rash, Scarring, Dry/Scaly, Maceration, Atrophie Blanche, Cyanosis, Ecchymosis, Hemosiderin Staining, Mottled, Pallor, Rubor, Erythema. Periwound temperature was noted as No Abnormality. Wound #7 status is Open. Original cause of wound was Pressure Injury. The wound is located on the Right Ischial Tuberosity. The wound measures 0.9cm length x 0.6cm width x 0.1cm depth; 0.424cm^2 area and 0.042cm^3 volume. The wound is limited to skin breakdown. There is no tunneling or undermining noted. There is a medium amount of serous drainage noted. The wound margin is distinct with the outline attached to the wound base. There is no granulation within the wound bed. There is a large (67-100%) amount of [name], [name] ([id number]) necrotic tissue within the wound bed including Eschar. The periwound skin appearance exhibited: Moist. The periwound skin appearance did not exhibit: Callus, Crepitus, Excoriation, Fluctuance, Friable, Induration, Localized Edema, Rash, Scarring, Dry/Scaly, Maceration, Atrophie Blanche, Cyanosis, Ecchymosis, Hemosiderin Staining, Mottled, Pallor, Rubor, Erythema. Periwound temperature was noted as No Abnormality. The periwound has tenderness on palpation. Wound #8 status is Open. Original cause of wound was Gradually Appeared. The wound is located on the Left Malleolus. The wound measures 0.6cm length x 1cm width x 0.2cm  depth; 0.471cm^2 area and 0.094cm^3 volume. The wound is limited to skin breakdown. There is no tunneling or undermining noted. There is a medium amount of serous drainage noted. The wound margin is flat and intact. There is medium (34-66%) granulation within the wound bed. There is a medium (34-66%) amount of necrotic tissue  within the wound bed including Adherent Slough. The periwound skin appearance exhibited: Moist, Erythema. The periwound skin appearance did not exhibit: Callus, Crepitus, Excoriation, Fluctuance, Friable, Induration, Localized Edema, Rash, Scarring, Dry/Scaly, Maceration, Atrophie Blanche, Cyanosis, Ecchymosis, Hemosiderin Staining, Mottled, Pallor, Rubor. The surrounding wound skin color is noted with erythema which is circumferential. Periwound temperature was noted as No Abnormality. The periwound has tenderness on palpation. Wound #9 status is Open. Original cause of wound was Gradually Appeared. The wound is located on the Left Ischial Tuberosity. The wound measures 0.6cm length x 0.6cm width x 0.2cm depth; 0.283cm^2 area and 0.057cm^3 volume. The wound is limited to skin breakdown. There is no tunneling or undermining noted. There is a medium amount of serous drainage noted. The wound margin is flat and intact. There is no granulation within the wound bed. There is a large (67-100%) amount of necrotic tissue within the wound bed including Adherent Slough. The periwound skin appearance exhibited: Moist. The periwound skin appearance did not exhibit: Callus, Crepitus, Excoriation, Fluctuance, Friable, Induration, Localized Edema, Rash, Scarring, Dry/Scaly, Maceration, Atrophie Blanche, Cyanosis, Ecchymosis, Hemosiderin Staining, Mottled, Pallor, Rubor, Erythema. Periwound temperature was noted as No Abnormality. Assessment Active Problems ICD-10 E11.621 - Type 2 diabetes mellitus with foot ulcer L89.613 - Pressure ulcer of right heel, stage 3 F17.218 - Nicotine  dependence, cigarettes, with other nicotine-induced disorders L89.100 - Pressure ulcer of unspecified part of back, unstageable L89.213 - Pressure ulcer of right hip, stage 3 Procedures Wound #13 Forbess, Gennett H. ([id number]) Wound #13 is a Pressure Ulcer located on the Left Calcaneus . There was a Skin/Subcutaneous Tissue Debridement [id number]) debridement with total area of 0.56 sq cm performed by Christin Fudge, MD. with the following instrument(s): Curette to remove Viable and Non-Viable tissue/material including Exudate, Fibrin/Slough, and Subcutaneous after achieving pain control using Other (lidocaine 4% cream). A time out was conducted prior to the start of the procedure. A Minimum amount of bleeding was controlled with Pressure. The procedure was tolerated well with a pain level of 0 throughout and a pain level of 0 following the procedure. Post Debridement Measurements: 0.7cm length x 0.8cm width x 0.1cm depth; 0.044cm^3 volume. Post debridement Stage noted as Category/Stage II. Post procedure Diagnosis Wound #13: Same as Pre-Procedure Wound #8 Wound #8 is a Pressure Ulcer located on the Left Malleolus . There was a Skin/Subcutaneous Tissue Debridement [id number]) debridement with total area of 0.6 sq cm performed by Christin Fudge, MD. with the following instrument(s): Curette to remove Viable and Non-Viable tissue/material including Exudate, Fibrin/Slough, and Subcutaneous after achieving pain control using Other (lidocaine 4% cream). A time out was conducted prior to the start of the procedure. A Minimum amount of bleeding was controlled with Pressure. The procedure was tolerated well with a pain level of 0 throughout and a pain level of 0 following the procedure. Post Debridement Measurements: 0.6cm length x 1cm width x 0.2cm depth; 0.094cm^3 volume. Post debridement Stage noted as Category/Stage II. Post procedure Diagnosis Wound #8: Same as Pre-Procedure Plan Wound  Cleansing: Wound #10 Right,Medial Foot: Clean wound with Normal Saline. Cleanse wound with mild soap and water Wound #11 Right,Distal Calcaneus: Clean wound with Normal Saline. Wound #12 Right,Proximal Calcaneus: Clean wound with Normal Saline. Cleanse wound with mild soap and water Wound #13 Left Calcaneus: Clean wound with Normal Saline. Cleanse wound with mild soap and water Wound #3 Midline Back: Clean wound with Normal Saline. Cleanse wound with mild soap and water Wound #7 Right Ischial Tuberosity: Clean wound with Normal  Saline. Cleanse wound with mild soap and water Wound #8 Left Malleolus: Clean wound with Normal Saline. Cleanse wound with mild soap and water Chai, Jacqulynn H. ([id number]) Wound #9 Left Ischial Tuberosity: Clean wound with Normal Saline. Cleanse wound with mild soap and water Anesthetic: Wound #10 Right,Medial Foot: Topical Lidocaine 4% cream applied to wound bed prior to debridement Wound #11 Right,Distal Calcaneus: Topical Lidocaine 4% cream applied to wound bed prior to debridement Wound #12 Right,Proximal Calcaneus: Topical Lidocaine 4% cream applied to wound bed prior to debridement Wound #13 Left Calcaneus: Topical Lidocaine 4% cream applied to wound bed prior to debridement Wound #3 Midline Back: Topical Lidocaine 4% cream applied to wound bed prior to debridement Wound #7 Right Ischial Tuberosity: Topical Lidocaine 4% cream applied to wound bed prior to debridement Wound #8 Left Malleolus: Topical Lidocaine 4% cream applied to wound bed prior to debridement Wound #9 Left Ischial Tuberosity: Topical Lidocaine 4% cream applied to wound bed prior to debridement Skin Barriers/Peri-Wound Care: Wound #10 Right,Medial Foot: Skin Prep Wound #11 Right,Distal Calcaneus: Skin Prep Wound #12 Right,Proximal Calcaneus: Skin Prep Wound #13 Left Calcaneus: Skin Prep Wound #3 Midline Back: Skin Prep Wound #7 Right Ischial Tuberosity: Skin  Prep Wound #8 Left Malleolus: Skin Prep Wound #9 Left Ischial Tuberosity: Skin Prep Primary Wound Dressing: Wound #10 Right,Medial Foot: Prisma Ag - reconstitute with normal saline Wound #11 Right,Distal Calcaneus: Santyl Ointment Wound #12 Right,Proximal Calcaneus: Prisma Ag - reconstitute with normal saline Wound #13 Left Calcaneus: Santyl Ointment Wound #3 Midline Back: Santyl Ointment Wound #7 Right Ischial Tuberosity: Santyl Ointment Wound #8 Left Malleolus: Riesen, Anastasya H. ([id number]) Santyl Ointment Wound #9 Left Ischial Tuberosity: Prisma Ag - reconstitute with normal saline Secondary Dressing: Wound #10 Right,Medial Foot: Boardered Foam Dressing Wound #11 Right,Distal Calcaneus: Boardered Foam Dressing Wound #12 Right,Proximal Calcaneus: Boardered Foam Dressing Wound #13 Left Calcaneus: Boardered Foam Dressing Wound #3 Midline Back: Boardered Foam Dressing Wound #7 Right Ischial Tuberosity: Boardered Foam Dressing Wound #8 Left Malleolus: Boardered Foam Dressing Wound #9 Left Ischial Tuberosity: Boardered Foam Dressing Dressing Change Frequency: Wound #10 Right,Medial Foot: Change dressing every other day. Wound #11 Right,Distal Calcaneus: Change dressing every day. Wound #12 Right,Proximal Calcaneus: Change dressing every other day. Wound #13 Left Calcaneus: Change dressing every other day. Wound #3 Midline Back: Change dressing every day. Wound #7 Right Ischial Tuberosity: Change dressing every other day. Wound #8 Left Malleolus: Change dressing every day. Wound #9 Left Ischial Tuberosity: Change dressing every other day. Follow-up Appointments: Wound #10 Right,Medial Foot: Return Appointment in 1 week. Wound #11 Right,Distal Calcaneus: Return Appointment in 1 week. Wound #12 Right,Proximal Calcaneus: Return Appointment in 1 week. Wound #13 Left Calcaneus: Return Appointment in 1 week. Wound #3 Midline Back: Return Appointment in 1  week. Wound #7 Right Ischial Tuberosity: Return Appointment in 1 week. Wound #8 Left Malleolus: [place], Helga H. ([id number]) Return Appointment in 1 week. Wound #9 Left Ischial Tuberosity: Return Appointment in 1 week. Off-Loading: Wound #10 Right,Medial Foot: Turn and reposition every 2 hours Other: - float heels while in the bed Wound #11 Right,Distal Calcaneus: Turn and reposition every 2 hours Other: - float heels while in the bed Wound #12 Right,Proximal Calcaneus: Turn and reposition every 2 hours Other: - float heels while in the bed Wound #13 Left Calcaneus: Turn and reposition every 2 hours Other: - float heels while in the bed Wound #3 Midline Back: Turn and reposition every 2 hours Other: - float heels while in the bed  Wound #7 Right Ischial Tuberosity: Turn and reposition every 2 hours Other: - float heels while in the bed Wound #8 Left Malleolus: Turn and reposition every 2 hours Other: - float heels while in the bed Wound #9 Left Ischial Tuberosity: Turn and reposition every 2 hours Other: - float heels while in the bed Home Health: Wound #10 Right,Medial Foot: [place] Visits - [facility] Nurse may visit PRN to address patient s wound care needs. FACE TO FACE ENCOUNTER: MEDICARE and MEDICAID PATIENTS: I certify that this patient is under my care and that I had a face-to-face encounter that meets the physician face-to-face encounter requirements with this patient on this date. The encounter with the patient was in whole or in part for the following MEDICAL CONDITION: (primary reason for [place]) MEDICAL NECESSITY: I certify, that based on my findings, NURSING services are a medically necessary home health service. HOME BOUND STATUS: I certify that my clinical findings support that this patient is homebound (i.e., Due to illness or injury, pt requires aid of supportive devices such as crutches, cane, wheelchairs, walkers, the use of  special transportation or the assistance of another person to leave their place of residence. There is a normal inability to leave the home and doing so requires considerable and taxing effort. Other absences are for medical reasons / religious services and are infrequent or of short duration when for other reasons). If current dressing causes regression in wound condition, may D/C ordered dressing product/s and apply Normal Saline Moist Dressing daily until next [place] / Other MD appointment. [place] of regression in wound condition at [phone]. Please direct any NON-WOUND related issues/requests for orders to patient's Primary Care Physician Wound #11 Right,Distal Calcaneus: [place] Visits - [facility] Nurse may visit PRN to address patient s wound care needs. FACE TO FACE ENCOUNTER: MEDICARE and MEDICAID PATIENTS: I certify that this patient is under my care and that I had a face-to-face encounter that meets the physician face-to-face encounter Stotz, Kaylen H. ([id number]) requirements with this patient on this date. The encounter with the patient was in whole or in part for the following MEDICAL CONDITION: (primary reason for [place]) MEDICAL NECESSITY: I certify, that based on my findings, NURSING services are a medically necessary home health service. HOME BOUND STATUS: I certify that my clinical findings support that this patient is homebound (i.e., Due to illness or injury, pt requires aid of supportive devices such as crutches, cane, wheelchairs, walkers, the use of special transportation or the assistance of another person to leave their place of residence. There is a normal inability to leave the home and doing so requires considerable and taxing effort. Other absences are for medical reasons / religious services and are infrequent or of short duration when for other reasons). If current dressing causes regression in  wound condition, may D/C ordered dressing product/s and apply Normal Saline Moist Dressing daily until next [place] / Other MD appointment. [place] of regression in wound condition at [phone]. Please direct any NON-WOUND related issues/requests for orders to patient's Primary Care Physician Wound #12 Right,Proximal Calcaneus: [place] Visits - [facility] Nurse may visit PRN to address patient s wound care needs. FACE TO FACE ENCOUNTER: MEDICARE and MEDICAID PATIENTS: I certify that this patient is under my care and that I had a face-to-face encounter that meets the physician face-to-face encounter requirements with this patient on  this date. The encounter with the patient was in whole or in part for the following MEDICAL CONDITION: (primary reason for [place]) MEDICAL NECESSITY: I certify, that based on my findings, NURSING services are a medically necessary home health service. HOME BOUND STATUS: I certify that my clinical findings support that this patient is homebound (i.e., Due to illness or injury, pt requires aid of supportive devices such as crutches, cane, wheelchairs, walkers, the use of special transportation or the assistance of another person to leave their place of residence. There is a normal inability to leave the home and doing so requires considerable and taxing effort. Other absences are for medical reasons / religious services and are infrequent or of short duration when for other reasons). If current dressing causes regression in wound condition, may D/C ordered dressing product/s and apply Normal Saline Moist Dressing daily until next [place] / Other MD appointment. [place] of regression in wound condition at [phone]. Please direct any NON-WOUND related issues/requests for orders to patient's Primary Care Physician Wound #13 Left Calcaneus: [place] Visits -  [facility] Nurse may visit PRN to address patient s wound care needs. FACE TO FACE ENCOUNTER: MEDICARE and MEDICAID PATIENTS: I certify that this patient is under my care and that I had a face-to-face encounter that meets the physician face-to-face encounter requirements with this patient on this date. The encounter with the patient was in whole or in part for the following MEDICAL CONDITION: (primary reason for [place]) MEDICAL NECESSITY: I certify, that based on my findings, NURSING services are a medically necessary home health service. HOME BOUND STATUS: I certify that my clinical findings support that this patient is homebound (i.e., Due to illness or injury, pt requires aid of supportive devices such as crutches, cane, wheelchairs, walkers, the use of special transportation or the assistance of another person to leave their place of residence. There is a normal inability to leave the home and doing so requires considerable and taxing effort. Other absences are for medical reasons / religious services and are infrequent or of short duration when for other reasons). If current dressing causes regression in wound condition, may D/C ordered dressing product/s and apply Normal Saline Moist Dressing daily until next [place] / Other MD appointment. [place] of regression in wound condition at [phone]. Please direct any NON-WOUND related issues/requests for orders to patient's Primary Care Physician Wound #3 Midline Back: [place] Visits - [facility] Nurse may visit PRN to address patient s wound care needs. FACE TO FACE ENCOUNTER: MEDICARE and MEDICAID PATIENTS: I certify that this patient is under my care and that I had a face-to-face encounter that meets the physician face-to-face encounter Mcneice, Aalyah H. ([id number]) requirements with this patient on this date. The encounter with the patient was in whole or in part for  the following MEDICAL CONDITION: (primary reason for [place]) MEDICAL NECESSITY: I certify, that based on my findings, NURSING services are a medically necessary home health service. HOME BOUND STATUS: I certify that my clinical findings support that this patient is homebound (i.e., Due to illness or injury, pt requires aid of supportive devices such as crutches, cane, wheelchairs, walkers, the use of special transportation or the assistance of another person to leave their place of residence. There is a normal inability to leave the home and doing so requires considerable and taxing effort. Other absences are for medical reasons / religious  services and are infrequent or of short duration when for other reasons). If current dressing causes regression in wound condition, may D/C ordered dressing product/s and apply Normal Saline Moist Dressing daily until next [place] / Other MD appointment. [place] of regression in wound condition at [phone]. Please direct any NON-WOUND related issues/requests for orders to patient's Primary Care Physician Wound #7 Right Ischial Tuberosity: [place] Visits - [facility] Nurse may visit PRN to address patient s wound care needs. FACE TO FACE ENCOUNTER: MEDICARE and MEDICAID PATIENTS: I certify that this patient is under my care and that I had a face-to-face encounter that meets the physician face-to-face encounter requirements with this patient on this date. The encounter with the patient was in whole or in part for the following MEDICAL CONDITION: (primary reason for [place]) MEDICAL NECESSITY: I certify, that based on my findings, NURSING services are a medically necessary home health service. HOME BOUND STATUS: I certify that my clinical findings support that this patient is homebound (i.e., Due to illness or injury, pt requires aid of supportive devices such as crutches, cane,  wheelchairs, walkers, the use of special transportation or the assistance of another person to leave their place of residence. There is a normal inability to leave the home and doing so requires considerable and taxing effort. Other absences are for medical reasons / religious services and are infrequent or of short duration when for other reasons). If current dressing causes regression in wound condition, may D/C ordered dressing product/s and apply Normal Saline Moist Dressing daily until next [place] / Other MD appointment. [place] of regression in wound condition at [phone]. Please direct any NON-WOUND related issues/requests for orders to patient's Primary Care Physician Wound #8 Left Malleolus: [place] Visits - [facility] Nurse may visit PRN to address patient s wound care needs. FACE TO FACE ENCOUNTER: MEDICARE and MEDICAID PATIENTS: I certify that this patient is under my care and that I had a face-to-face encounter that meets the physician face-to-face encounter requirements with this patient on this date. The encounter with the patient was in whole or in part for the following MEDICAL CONDITION: (primary reason for [place]) MEDICAL NECESSITY: I certify, that based on my findings, NURSING services are a medically necessary home health service. HOME BOUND STATUS: I certify that my clinical findings support that this patient is homebound (i.e., Due to illness or injury, pt requires aid of supportive devices such as crutches, cane, wheelchairs, walkers, the use of special transportation or the assistance of another person to leave their place of residence. There is a normal inability to leave the home and doing so requires considerable and taxing effort. Other absences are for medical reasons / religious services and are infrequent or of short duration when for other reasons). If current dressing causes regression in wound  condition, may D/C ordered dressing product/s and apply Normal Saline Moist Dressing daily until next [place] / Other MD appointment. [place] of regression in wound condition at [phone]. Please direct any NON-WOUND related issues/requests for orders to patient's Primary Care Physician Wound #9 Left Ischial Tuberosity: [place] Visits - [facility] Nurse may visit PRN to address patient s wound care needs. FACE TO FACE ENCOUNTER: MEDICARE and MEDICAID PATIENTS: I certify that this patient is under my care and that I had a face-to-face encounter that meets the physician face-to-face encounter  Bua, Chen H. ([id number]) requirements with this patient on this date. The encounter with the patient was in whole or in part for the following MEDICAL CONDITION: (primary reason for [place]) MEDICAL NECESSITY: I certify, that based on my findings, NURSING services are a medically necessary home health service. HOME BOUND STATUS: I certify that my clinical findings support that this patient is homebound (i.e., Due to illness or injury, pt requires aid of supportive devices such as crutches, cane, wheelchairs, walkers, the use of special transportation or the assistance of another person to leave their place of residence. There is a normal inability to leave the home and doing so requires considerable and taxing effort. Other absences are for medical reasons / religious services and are infrequent or of short duration when for other reasons). If current dressing causes regression in wound condition, may D/C ordered dressing product/s and apply Normal Saline Moist Dressing daily until next [place] / Other MD appointment. [place] of regression in wound condition at [phone]. Please direct any NON-WOUND related issues/requests for orders to patient's Primary Care Physician I have recommended we use Santyl  over all the wounds with slough and those with healthy granulation tissue we'll use Prisma AG. I have also asked her to continue to offload as well as possible. We have again discussed offloading, vitamin supplements and increase protein and she is agreeable to use this. We will see her back next week. Electronic Signature(s) Signed: 11/13/2015 4:49:52 PM By: Christin Fudge MD, FACS Entered By: Christin Fudge on 11/13/2015 16:49:52 Nott, Carrissa H. ([id number]) -------------------------------------------------------------------------------- SuperBill Details Patient Name: [name], Reeya H. Date of Service: 11/13/2015 Medical Record Number: [id number] Patient Account Number: [id number] Date of Birth/Sex: 1946-02-01 (69 y.o. 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, stage 3 L89.623 Pressure ulcer of left heel, stage 3 Facility Procedures CPT4 Code: [id number] Description: [id number] - DEB SUBQ TISSUE 20 SQ CM/< ICD-10 Description Diagnosis 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

## 2015-11-21 ENCOUNTER — Encounter: Payer: Medicare Other | Admitting: Surgery

## 2015-11-21 DIAGNOSIS — E11621 Type 2 diabetes mellitus with foot ulcer: Secondary | ICD-10-CM | POA: Diagnosis not present

## 2015-11-22 NOTE — Progress Notes (Signed)
FELMLEE, [name] ([id number]) Visit Report for 11/21/2015 Chief Complaint Document Details Patient Name: [name], [name] Date of Service: 11/21/2015 2:45 PM Medical Record Number: [id number] Patient Account Number: [id number] Date of Birth/Sex: 10-31-1945 (69 y.o. Female) Treating RN: Montey Hora Primary Care Physician: Paulita Cradle Other Clinician: Referring Physician: Paulita Cradle Treating Physician/Extender: Frann Rider in Treatment: 64 Information Obtained from: Patient Chief Complaint Patient presents to the wound care center for a consult due non healing wound. Ulcers on the right elbow and the right heel for about 1 month. Electronic Signature(s) Signed: 11/21/2015 4:15:04 PM By: Christin Fudge MD, FACS Entered By: Christin Fudge on 11/21/2015 16:15:04 Muegge, Jadis H. ([id number]) -------------------------------------------------------------------------------- HPI Details Patient Name: [name], [name] Date of Service: 11/21/2015 2:45 PM Medical Record Number: [id number] Patient Account Number: [id number] Date of Birth/Sex: 08/15/1946 (69 y.o. Female) Treating RN: Montey Hora Primary Care Physician: Paulita Cradle Other Clinician: Referring Physician: Paulita Cradle Treating Physician/Extender: Frann Rider in Treatment: 36 History of Present Illness Location: Ulceration on the right heel and the right elbow. Quality: Patient reports experiencing a dull pain to affected area(s). Severity: Patient states wound (s) are getting better. Duration: Patient has had the wound for < 4 weeks prior to presenting for treatment Timing: Pain in wound is Intermittent (comes and goes Context: The wound appeared gradually over time Modifying Factors: Consults to this date include:Augmentin and Bactrim and also some heel protection with duoderm Associated Signs and Symptoms: Patient reports having difficulty standing for long periods. HPI Description:  70 year old female with history of peripheral neuropathy, history of diet controlled diabetes mellitus type 2, history of alcoholism here for wound consult sent by her PCP Dr. Sherilyn Cooter. She has pressure ulcers at her right elbow, bilateral heels. Plain films of right calcaneus without acute bony process. Patient started by PCP on Augmentin, Bactrim as per orders, DuoDerm dressings applied - reports some improvement in her ulcer since last seen. Denies fever, chills, nausea, vomiting, diarrhea. She had a right humerus fracture in the middle of May and has had no surgery and arm is in a sling. She is also been laying in the bed for quite a while. Past medical history significant for essential hypertension, osteoporosis, peripheral neuropathy, alcoholism, ataxia, personal history of breast cancer treated with surgery chemotherapy and radiation and this was done in December 2010. she is also status post laparoscopic cholecystectomy, pilonidal cyst excision, subcutaneous port placement, partial mastectomy on the left side, skin cancer removal. 03/21/2015 -- she says overall she's been doing better and continues to smoke about 15 cigarettes a day. 03/21/2015 - her orthopedic doctor has said she may require surgery for her right humerus fracture. 04/04/2015 -- her orthopedic surgery has been scheduled for August 11. 04/18/2015 -- she is doing fine as far as her elbow and her right heel goes but she has developed some redness over prominence on her thoracic spine and wanted me to take a look at this. 05/02/2015 -- she had her surgery done and now is in a sling and support. Her back has developed a pressure injury of undetermined stage. She seems to be in better spirits. 05/16/2015 -- last week her right heel was looking great and we had healed it out, but she has not been offloading appropriately and has a deep tissue injury on the right heel again. The area on her right elbow has opened out with slough and  the area in the thoracic spine is also getting worse. 05/30/2015 -- she has developed  2 new ulcerations one on her left ischial tuberosity and one on the sacral region. She is still working on getting up smoking but is also unable to take her vitamins as she says she develops a diarrhea when she takes vitamins. She has increased her intake of proteins. 06/06/2015 -- the patient's husband manages to get her a low air loss mattress with initiating pressure but [name], [name] ([id number]) did not know how to use it exactly and the patient was not happy about using it. Overall she says she's been feeling better. 07/11/2015 -- . Discussed a surgical opinion for debridement and application of a wound VAC, 2 weeks ago but the patient has been reluctant to get a surgical opinion as she wants to avoid surgery. 07/18/2015 -- they have an appointment to see Dr. Tamala Julian this coming Wednesday. 07/29/2015 -- they saw Dr. Tamala Julian in his office and he did a debridement of the wound and removed significant amount of slough. This is in addition to the debridement I had done previously on Friday where a large amount of the eschar was removed. He did not recommend the application of wound VAC. Addendum : Dr. Thompson Caul note was reviewed via EPIC and details noted as above. 08/05/2015 -- over the last week she has developed a another pressure injury to her right hip and has had significant discoloration of the skin and an eschar there. 08/15/2015 -- she is still smoking about a pack of cigarettes a day and does not seem to want to quit. They have not been able to talk to the vendor regarding the air mattress and I will ask them to get in touch again. She is reluctant to take vitamins and does admit her nutrition is poor. 08/22/2015 -- she has been unable to tolerate her vitamins and continues to smoke. They're working on getting a low air loss mattress and have been speaking with the vendor. 09/19/2015 -- since her last  visit and was admitted to the hospital between 09/09/2015 and 09/16/2015. She was thought to have an active sepsis possibly from one of her decubitus ulcers but nothing was grown except for an MSSA from her thoracic spine region. CT of this area did not show any osteomyelitis. Been given IV antibiotics which included vancomycin and Zosyn in the hospital under the care of Dr. [name] the ID specialist she was sent home on oral Keflex 500 mg 3 times a day for 2 weeks. he will consider an MRI in the outpatient setting to completely rule out osteomyelitis of the spine. 10/03/2015 --readmitted to hospital on 09/23/2015 for general debility, lethargy and possible sepsis and workup was in progress. Seen by Dr. [name] and he would also like a workup for underlying malignancy as sheos had previous ultrasounds of the breasts suggesting findings of concern. Workup done so far does not suggest deep bony infection. She was treated for a pneumonia and received Zosyn and vancomycin. She had been recommended Cipro 500 twice a day and doxycycline 100 mg twice a day once she was to be [place] home. The antibiotics were to be stopped on 10/07/2015. 10/17/2015 -- patient is feeling much better and has been eating better and doing her offloading as much as possible. 10/23/2015 -- she has an appointment to see Dr. [name] tomorrow but other than that has been doing as much as possible with offloading and increasing her diet. 11/07/2015 -- she has not been here to see [place] for 2 weeks and at the present time continues to  try and eat better, work on off loading and is using the air mattress at night. She saw her PCP yesterday and she has put her back on a fentanyl patch. 11/21/2015 -- for some strange reason she has been sitting in a chair with her legs dependent for the last 6 days nonstop and has developed massive lower extremity edema and pedal edema with weeping and ulceration. Electronic  Signature(s) Signed: 11/21/2015 4:15:43 PM By: Christin Fudge MD, FACS Entered By: Christin Fudge on 11/21/2015 16:15:43 Lyster, Raisha H. ([id number]) -------------------------------------------------------------------------------- Physical Exam Details Patient Name: Szczesniak, Vennesa H. Date of Service: 11/21/2015 2:45 PM Medical Record Number: [id number] Patient Account Number: [id number] Date of Birth/Sex: 01-19-1946 (69 y.o. Female) Treating RN: Montey Hora Primary Care Physician: Paulita Cradle Other Clinician: Referring Physician: Paulita Cradle Treating Physician/Extender: Frann Rider in Treatment: 36 Constitutional . Pulse regular. Respirations normal and unlabored. Afebrile. . Eyes Nonicteric. Reactive to light. Ears, Nose, Mouth, and Throat Lips, teeth, and gums WNL.Marland Kitchen Moist mucosa without lesions. Neck supple and nontender. No palpable supraclavicular or cervical adenopathy. Normal sized without goiter. Respiratory WNL. No retractions.. Breath sounds WNL, No rubs, rales, rhonchi, or wheeze.. Cardiovascular Heart rhythm and rate regular, no murmur or gallop.. Pedal Pulses WNL. No clubbing, cyanosis or edema. Lymphatic No adneopathy. No adenopathy. No adenopathy. Musculoskeletal Adexa without tenderness or enlargement.. Digits and nails w/o clubbing, cyanosis, infection, petechiae, ischemia, or inflammatory conditions.. Integumentary (Hair, Skin) No suspicious lesions. No crepitus or fluctuance. No peri-wound warmth or erythema. No masses.Marland Kitchen Psychiatric Judgement and insight Intact.. No evidence of depression, anxiety, or agitation.. Notes the wounds all have significant amount of slough which cannot be removed with a curette as she is too tender to sharply and debride this. 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 There is also significant weeping from both  legs Electronic Signature(s) Signed: 11/21/2015 4:16:58 PM By: Christin Fudge MD, FACS Entered By: Christin Fudge on 11/21/2015 16:16:58 Millirons, Cara H. ([id number]) -------------------------------------------------------------------------------- Physician Orders Details Patient Name: [name], Emmalea H. Date of Service: 11/21/2015 2:45 PM Medical Record Number: [id number] Patient Account Number: [id number] Date of Birth/Sex: 12/25/1945 (69 y.o. Female) Treating RN: Montey Hora Primary Care Physician: Paulita Cradle Other Clinician: Referring Physician: Paulita Cradle Treating Physician/Extender: Frann Rider in Treatment: 56 Verbal / Phone Orders: Yes Clinician: Montey Hora Read Back and Verified: Yes Diagnosis Coding Wound Cleansing Wound #10 Right,Medial Foot o Clean wound with Normal Saline. o Cleanse wound with mild soap and water Wound #11 Right,Distal Calcaneus o Clean wound with Normal Saline. o Cleanse wound with mild soap and water Wound #13 Left Calcaneus o Clean wound with Normal Saline. o Cleanse wound with mild soap and water Wound #14 Right,Anterior Lower Leg o Clean wound with Normal Saline. o Cleanse wound with mild soap and water Wound #3 Midline Back o Clean wound with Normal Saline. o Cleanse wound with mild soap and water Wound #7 Right Ischial Tuberosity o Clean wound with Normal Saline. o Cleanse wound with mild soap and water Wound #8 Left Malleolus o Clean wound with Normal Saline. o Cleanse wound with mild soap and water Wound #9 Left Ischial Tuberosity o Clean wound with Normal Saline. o Cleanse wound with mild soap and water Anesthetic Wound #10 Right,Medial Foot o Topical Lidocaine 4% cream applied to wound bed prior to debridement Wound #11 Right,Distal Calcaneus Chenevert, Tyhesha H. ([id number]) o Topical Lidocaine 4% cream applied to wound bed  prior to debridement Wound #13 Left Calcaneus o  Topical Lidocaine 4% cream applied to wound bed prior to debridement Wound #14 Right,Anterior Lower Leg o Topical Lidocaine 4% cream applied to wound bed prior to debridement Wound #3 Midline Back o Topical Lidocaine 4% cream applied to wound bed prior to debridement Wound #7 Right Ischial Tuberosity o Topical Lidocaine 4% cream applied to wound bed prior to debridement Wound #8 Left Malleolus o Topical Lidocaine 4% cream applied to wound bed prior to debridement Wound #9 Left Ischial Tuberosity o Topical Lidocaine 4% cream applied to wound bed prior to debridement Skin Barriers/Peri-Wound Care Wound #10 Right,Medial Foot o Skin Prep Wound #11 Right,Distal Calcaneus o Skin Prep Wound #13 Left Calcaneus o Skin Prep Wound #14 Right,Anterior Lower Leg o Skin Prep Wound #3 Midline Back o Skin Prep Wound #7 Right Ischial Tuberosity o Skin Prep Wound #8 Left Malleolus o Skin Prep Wound #9 Left Ischial Tuberosity o Skin Prep Primary Wound Dressing Wound #10 Right,Medial Foot o Santyl Ointment [name], [name] ([id number]) Wound #11 Right,Distal Calcaneus o Santyl Ointment Wound #13 Left Calcaneus o Santyl Ointment Wound #3 Midline Back o Santyl Ointment Wound #7 Right Ischial Tuberosity o Santyl Ointment Wound #8 Left Malleolus o Santyl Ointment Wound #9 Left Ischial Tuberosity o Aquacel Ag o ABD Pad - any draining areas o XtraSorb - as needed to both lower legs - any draining areas Secondary Dressing Wound #10 Right,Medial Foot o Boardered Foam Dressing Wound #11 Right,Distal Calcaneus o Boardered Foam Dressing Wound #13 Left Calcaneus o Boardered Foam Dressing Wound #3 Midline Back o Boardered Foam Dressing Wound #7 Right Ischial Tuberosity o Boardered Foam Dressing Wound #8 Left Malleolus o Boardered Foam Dressing Wound #9 Left Ischial Tuberosity o Boardered Foam Dressing Wound #14 Right,Anterior Lower  Leg o Conform/Kerlix Dressing Change Frequency Wound #10 Right,Medial Foot o Change dressing every day. [name], [name] ([id number]) Wound #11 Right,Distal Calcaneus o Change dressing every day. Wound #13 Left Calcaneus o Change dressing every day. Wound #14 Right,Anterior Lower Leg o Change dressing every day. Wound #3 Midline Back o Change dressing every day. Wound #7 Right Ischial Tuberosity o Change dressing every day. Wound #8 Left Malleolus o Change dressing every day. Wound #9 Left Ischial Tuberosity o Change dressing every day. Follow-up Appointments Wound #10 Right,Medial Foot o Return Appointment in 1 week. Wound #11 Right,Distal Calcaneus o Return Appointment in 1 week. Wound #13 Left Calcaneus o Return Appointment in 1 week. Wound #14 Right,Anterior Lower Leg o Return Appointment in 1 week. Wound #3 Midline Back o Return Appointment in 1 week. Wound #7 Right Ischial Tuberosity o Return Appointment in 1 week. Wound #8 Left Malleolus o Return Appointment in 1 week. Wound #9 Left Ischial Tuberosity o Return Appointment in 1 week. Edema Control Wound #9 Left Ischial Tuberosity [name], [name] ([id number]) o Elevate legs to the level of the heart and pump ankles as often as possible Off-Loading Wound #10 Right,Medial Foot o Turn and reposition every 2 hours o Other: - float heels while in the bed Wound #11 Right,Distal Calcaneus o Turn and reposition every 2 hours o Other: - float heels while in the bed Wound #13 Left Calcaneus o Turn and reposition every 2 hours o Other: - float heels while in the bed Wound #3 Midline Back o Turn and reposition every 2 hours o Other: - float heels while in the bed Wound #7 Right Ischial Tuberosity o Turn and reposition every 2 hours o Other: -  float heels while in the bed Wound #8 Left Malleolus o Turn and reposition every 2 hours o Other: - float heels while  in the bed Wound #9 Left Ischial Tuberosity o Turn and reposition every 2 hours o Other: - float heels while in the bed Home Health Wound #10 [place] Visits - [place] Nurse may visit PRN to address patientos wound care needs. o FACE TO FACE ENCOUNTER: MEDICARE and MEDICAID PATIENTS: I certify that this patient is under my care and that I had a face-to-face encounter that meets the physician face-to-face encounter requirements with this patient on this date. The encounter with the patient was in whole or in part for the following MEDICAL CONDITION: (primary reason for [place]) MEDICAL NECESSITY: I certify, that based on my findings, NURSING services are a medically necessary home health service. HOME BOUND STATUS: I certify that my clinical findings support that this patient is homebound (i.e., Due to illness or injury, pt requires aid of supportive devices such as crutches, cane, wheelchairs, walkers, the use of special transportation or the assistance of another person to leave their place of residence. There is a normal inability to leave the home and doing so requires considerable and taxing effort. Other absences are for medical reasons / religious services and are infrequent or of short duration when for other reasons). Fines, Shantrice H. ([id number]) o If current dressing causes regression in wound condition, may D/C ordered dressing product/s and apply Normal Saline Moist Dressing daily until next [place] / Other MD appointment. [place] of regression in wound condition at [phone]. o Please direct any NON-WOUND related issues/requests for orders to patient's Primary Care Physician Wound #11 [place] Visits - [place] Nurse may visit PRN to address patientos wound care needs. o FACE TO FACE ENCOUNTER: MEDICARE and MEDICAID  PATIENTS: I certify that this patient is under my care and that I had a face-to-face encounter that meets the physician face-to-face encounter requirements with this patient on this date. The encounter with the patient was in whole or in part for the following MEDICAL CONDITION: (primary reason for [place]) MEDICAL NECESSITY: I certify, that based on my findings, NURSING services are a medically necessary home health service. HOME BOUND STATUS: I certify that my clinical findings support that this patient is homebound (i.e., Due to illness or injury, pt requires aid of supportive devices such as crutches, cane, wheelchairs, walkers, the use of special transportation or the assistance of another person to leave their place of residence. There is a normal inability to leave the home and doing so requires considerable and taxing effort. Other absences are for medical reasons / religious services and are infrequent or of short duration when for other reasons). o If current dressing causes regression in wound condition, may D/C ordered dressing product/s and apply Normal Saline Moist Dressing daily until next [place] / Other MD appointment. [place] of regression in wound condition at [phone]. o Please direct any NON-WOUND related issues/requests for orders to patient's Primary Care Physician Wound #13 Left Calcaneus o [place] Visits - [place] Nurse may visit PRN to address patientos wound care needs. o FACE TO FACE ENCOUNTER: MEDICARE and MEDICAID PATIENTS: I certify that this patient is under my care and that I had a face-to-face encounter that meets the physician face-to-face encounter requirements with this patient  on this date. The encounter with the patient was in whole or in part for the following MEDICAL CONDITION: (primary reason for [place]) MEDICAL NECESSITY: I certify, that based on my findings,  NURSING services are a medically necessary home health service. HOME BOUND STATUS: I certify that my clinical findings support that this patient is homebound (i.e., Due to illness or injury, pt requires aid of supportive devices such as crutches, cane, wheelchairs, walkers, the use of special transportation or the assistance of another person to leave their place of residence. There is a normal inability to leave the home and doing so requires considerable and taxing effort. Other absences are for medical reasons / religious services and are infrequent or of short duration when for other reasons). o If current dressing causes regression in wound condition, may D/C ordered dressing product/s and apply Normal Saline Moist Dressing daily until next [place] / Other MD appointment. [place] of regression in wound condition at [phone]. o Please direct any NON-WOUND related issues/requests for orders to patient's Primary Care Physician Wound #14 [place] Visits - [place] Flamand, Kelvin H. ([id number]) o [place] Nurse may visit PRN to address patientos wound care needs. o FACE TO FACE ENCOUNTER: MEDICARE and MEDICAID PATIENTS: I certify that this patient is under my care and that I had a face-to-face encounter that meets the physician face-to-face encounter requirements with this patient on this date. The encounter with the patient was in whole or in part for the following MEDICAL CONDITION: (primary reason for [place]) MEDICAL NECESSITY: I certify, that based on my findings, NURSING services are a medically necessary home health service. HOME BOUND STATUS: I certify that my clinical findings support that this patient is homebound (i.e., Due to illness or injury, pt requires aid of supportive devices such as crutches, cane, wheelchairs, walkers, the use of special transportation or the assistance of  another person to leave their place of residence. There is a normal inability to leave the home and doing so requires considerable and taxing effort. Other absences are for medical reasons / religious services and are infrequent or of short duration when for other reasons). o If current dressing causes regression in wound condition, may D/C ordered dressing product/s and apply Normal Saline Moist Dressing daily until next [place] / Other MD appointment. [place] of regression in wound condition at [phone]. o Please direct any NON-WOUND related issues/requests for orders to patient's Primary Care Physician Wound #3 Midline Back o [place] Visits - [place] Nurse may visit PRN to address patientos wound care needs. o FACE TO FACE ENCOUNTER: MEDICARE and MEDICAID PATIENTS: I certify that this patient is under my care and that I had a face-to-face encounter that meets the physician face-to-face encounter requirements with this patient on this date. The encounter with the patient was in whole or in part for the following MEDICAL CONDITION: (primary reason for [place]) MEDICAL NECESSITY: I certify, that based on my findings, NURSING services are a medically necessary home health service. HOME BOUND STATUS: I certify that my clinical findings support that this patient is homebound (i.e., Due to illness or injury, pt requires aid of supportive devices such as crutches, cane, wheelchairs, walkers, the use of special transportation or the assistance of another person to leave their place of residence. There is a normal inability to leave the home and doing so requires considerable and  taxing effort. Other absences are for medical reasons / religious services and are infrequent or of short duration when for other reasons). o If current dressing causes regression in wound condition, may D/C ordered dressing product/s and  apply Normal Saline Moist Dressing daily until next [place] / Other MD appointment. [place] of regression in wound condition at [phone]. o Please direct any NON-WOUND related issues/requests for orders to patient's Primary Care Physician Wound #7 Right Ischial [place] Visits - [place] Nurse may visit PRN to address patientos wound care needs. o FACE TO FACE ENCOUNTER: MEDICARE and MEDICAID PATIENTS: I certify that this patient is under my care and that I had a face-to-face encounter that meets the physician face-to-face encounter requirements with this patient on this date. The encounter with the patient was in whole or in part for the following MEDICAL CONDITION: (primary reason for [place]) MEDICAL NECESSITY: I certify, that based on my findings, NURSING services are a medically necessary home health service. HOME BOUND STATUS: I certify that my clinical findings support that this patient is homebound (i.e., Due to illness or injury, pt requires aid of [place], Michela H. ([id number]) supportive devices such as crutches, cane, wheelchairs, walkers, the use of special transportation or the assistance of another person to leave their place of residence. There is a normal inability to leave the home and doing so requires considerable and taxing effort. Other absences are for medical reasons / religious services and are infrequent or of short duration when for other reasons). o If current dressing causes regression in wound condition, may D/C ordered dressing product/s and apply Normal Saline Moist Dressing daily until next [place] / Other MD appointment. [place] of regression in wound condition at [phone]. o Please direct any NON-WOUND related issues/requests for orders to patient's Primary Care Physician Wound #8 Left [place] Visits -  [place] Nurse may visit PRN to address patientos wound care needs. o FACE TO FACE ENCOUNTER: MEDICARE and MEDICAID PATIENTS: I certify that this patient is under my care and that I had a face-to-face encounter that meets the physician face-to-face encounter requirements with this patient on this date. The encounter with the patient was in whole or in part for the following MEDICAL CONDITION: (primary reason for [place]) MEDICAL NECESSITY: I certify, that based on my findings, NURSING services are a medically necessary home health service. HOME BOUND STATUS: I certify that my clinical findings support that this patient is homebound (i.e., Due to illness or injury, pt requires aid of supportive devices such as crutches, cane, wheelchairs, walkers, the use of special transportation or the assistance of another person to leave their place of residence. There is a normal inability to leave the home and doing so requires considerable and taxing effort. Other absences are for medical reasons / religious services and are infrequent or of short duration when for other reasons). o If current dressing causes regression in wound condition, may D/C ordered dressing product/s and apply Normal Saline Moist Dressing daily until next [place] / Other MD appointment. [place] of regression in wound condition at [phone]. o Please direct any NON-WOUND related issues/requests for orders to patient's Primary Care Physician Wound #9 Left [place] Visits - [place] Nurse may visit PRN to address patientos wound care needs. o FACE TO FACE ENCOUNTER:  MEDICARE and MEDICAID PATIENTS: I certify that this patient is under my care and that I had a face-to-face encounter that meets the physician face-to-face encounter requirements with this patient on this date. The encounter with the patient was in whole  or in part for the following MEDICAL CONDITION: (primary reason for [place]) MEDICAL NECESSITY: I certify, that based on my findings, NURSING services are a medically necessary home health service. HOME BOUND STATUS: I certify that my clinical findings support that this patient is homebound (i.e., Due to illness or injury, pt requires aid of supportive devices such as crutches, cane, wheelchairs, walkers, the use of special transportation or the assistance of another person to leave their place of residence. There is a normal inability to leave the home and doing so requires considerable and taxing effort. Other absences are for medical reasons / religious services and are infrequent or of short duration when for other reasons). o If current dressing causes regression in wound condition, may D/C ordered dressing product/s and apply Normal Saline Moist Dressing daily until next [place] / Other MD appointment. [place] of regression in wound condition at [phone]. Mccoy, Dreyah H. ([id number]) o Please direct any NON-WOUND related issues/requests for orders to patient's Primary Care Physician Electronic Signature(s) Signed: 11/21/2015 5:05:38 PM By: Christin Fudge MD, FACS Signed: 11/21/2015 7:01:59 PM By: Montey Hora Entered By: Montey Hora on 11/21/2015 16:07:15 [name], [name] ([id number]) -------------------------------------------------------------------------------- Problem List Details Patient Name: Skaggs, Ceceilia H. Date of Service: 11/21/2015 2:45 PM Medical Record Number: [id number] Patient Account Number: [id number] Date of Birth/Sex: [birth year] (69 y.o. Female) Treating RN: Montey Hora Primary Care Physician: Paulita Cradle Other Clinician: Referring Physician: Paulita Cradle Treating Physician/Extender: Frann Rider in Treatment: 65 Active Problems ICD-10 Encounter Code Description Active  Date Diagnosis E11.621 Type 2 diabetes mellitus with foot ulcer 03/13/2015 Yes L89.613 Pressure ulcer of right heel, stage 3 03/13/2015 Yes F17.218 Nicotine dependence, cigarettes, with other nicotine- 03/13/2015 Yes induced disorders L89.100 Pressure ulcer of unspecified part of back, unstageable 05/02/2015 Yes L89.213 Pressure ulcer of right hip, stage 3 08/05/2015 Yes I89.0 Lymphedema, not elsewhere classified 11/21/2015 Yes L97.211 Non-pressure chronic ulcer of right calf limited to 11/21/2015 Yes breakdown of skin Inactive Problems Resolved Problems ICD-10 Code Description Active Date Resolved Date L89.013 Pressure ulcer of right elbow, stage 3 03/13/2015 03/13/2015 [id number] Pressure ulcer of left hip, stage 2 05/30/2015 05/30/2015 Okey, Tyjae H. ([id number]) L89.153 Pressure ulcer of sacral region, stage 3 05/30/2015 05/30/2015 Electronic Signature(s) Signed: 11/21/2015 4:14:57 PM By: Christin Fudge MD, FACS Entered By: Christin Fudge on 11/21/2015 16:14:57 [name], Antoinette H. ([id number]) -------------------------------------------------------------------------------- Progress Note Details Patient Name: [name], [name] Date of Service: 11/21/2015 2:45 PM Medical Record Number: [id number] Patient Account Number: [id number] Date of Birth/Sex: September 12, 1946 (69 y.o. Female) Treating RN: Montey Hora Primary Care Physician: Paulita Cradle Other Clinician: Referring Physician: Paulita Cradle Treating Physician/Extender: Frann Rider in Treatment: 45 Subjective Chief Complaint Information obtained from Patient Patient presents to the wound care center for a consult due non healing wound. Ulcers on the right elbow and the right heel for about 1 month. History of Present Illness (HPI) The following HPI elements were documented for the patient's wound: Location: Ulceration on the right heel and the right elbow. Quality: Patient reports experiencing a dull pain to affected  area(s). Severity: Patient states wound (s) are getting better. Duration: Patient has had the wound for < 4 weeks prior to presenting for treatment Timing: Pain in  wound is Intermittent (comes and goes Context: The wound appeared gradually over time Modifying Factors: Consults to this date include:Augmentin and Bactrim and also some heel protection with duoderm Associated Signs and Symptoms: Patient reports having difficulty standing for long periods. 70 year old female with history of peripheral neuropathy, history of diet controlled diabetes mellitus type 2, history of alcoholism here for wound consult sent by her PCP Dr. Sherilyn Cooter. She has pressure ulcers at her right elbow, bilateral heels. Plain films of right calcaneus without acute bony process. Patient started by PCP on Augmentin, Bactrim as per orders, DuoDerm dressings applied - reports some improvement in her ulcer since last seen. Denies fever, chills, nausea, vomiting, diarrhea. She had a right humerus fracture in the middle of May and has had no surgery and arm is in a sling. She is also been laying in the bed for quite a while. Past medical history significant for essential hypertension, osteoporosis, peripheral neuropathy, alcoholism, ataxia, personal history of breast cancer treated with surgery chemotherapy and radiation and this was done in December 2010. she is also status post laparoscopic cholecystectomy, pilonidal cyst excision, subcutaneous port placement, partial mastectomy on the left side, skin cancer removal. 03/21/2015 -- she says overall she's been doing better and continues to smoke about 15 cigarettes a day. 03/21/2015 - her orthopedic doctor has said she may require surgery for her right humerus fracture. 04/04/2015 -- her orthopedic surgery has been scheduled for August 11. 04/18/2015 -- she is doing fine as far as her elbow and her right heel goes but she has developed some redness over prominence on her thoracic  spine and wanted me to take a look at this. Zwart, Sena H. ([id number]) 05/02/2015 -- she had her surgery done and now is in a sling and support. Her back has developed a pressure injury of undetermined stage. She seems to be in better spirits. 05/16/2015 -- last week her right heel was looking great and we had healed it out, but she has not been offloading appropriately and has a deep tissue injury on the right heel again. The area on her right elbow has opened out with slough and the area in the thoracic spine is also getting worse. 05/30/2015 -- she has developed 2 new ulcerations one on her left ischial tuberosity and one on the sacral region. She is still working on getting up smoking but is also unable to take her vitamins as she says she develops a diarrhea when she takes vitamins. She has increased her intake of proteins. 06/06/2015 -- the patient's husband manages to get her a low air loss mattress with initiating pressure but did not know how to use it exactly and the patient was not happy about using it. Overall she says she's been feeling better. 07/11/2015 -- . Discussed a surgical opinion for debridement and application of a wound VAC, 2 weeks ago but the patient has been reluctant to get a surgical opinion as she wants to avoid surgery. 07/18/2015 -- they have an appointment to see Dr. Tamala Julian this coming Wednesday. 07/29/2015 -- they saw Dr. Tamala Julian in his office and he did a debridement of the wound and removed significant amount of slough. This is in addition to the debridement I had done previously on Friday where a large amount of the eschar was removed. He did not recommend the application of wound VAC. Addendum : Dr. Thompson Caul note was reviewed via EPIC and details noted as above. 08/05/2015 -- over the last week she has developed a  another pressure injury to her right hip and has had significant discoloration of the skin and an eschar there. 08/15/2015 -- she is still smoking  about a pack of cigarettes a day and does not seem to want to quit. They have not been able to talk to the vendor regarding the air mattress and I will ask them to get in touch again. She is reluctant to take vitamins and does admit her nutrition is poor. 08/22/2015 -- she has been unable to tolerate her vitamins and continues to smoke. They're working on getting a low air loss mattress and have been speaking with the vendor. 09/19/2015 -- since her last visit and was admitted to the hospital between 09/09/2015 and 09/16/2015. She was thought to have an active sepsis possibly from one of her decubitus ulcers but nothing was grown except for an MSSA from her thoracic spine region. CT of this area did not show any osteomyelitis. Been given IV antibiotics which included vancomycin and Zosyn in the hospital under the care of Dr. [name] the ID specialist she was sent home on oral Keflex 500 mg 3 times a day for 2 weeks. he will consider an MRI in the outpatient setting to completely rule out osteomyelitis of the spine. 10/03/2015 --readmitted to hospital on 09/23/2015 for general debility, lethargy and possible sepsis and workup was in progress. Seen by Dr. [name] and he would also like a workup for underlying malignancy as she s had previous ultrasounds of the breasts suggesting findings of concern. Workup done so far does not suggest deep bony infection. She was treated for a pneumonia and received Zosyn and vancomycin. She had been recommended Cipro 500 twice a day and doxycycline 100 mg twice a day once she was to be [place] home. The antibiotics were to be stopped on 10/07/2015. 10/17/2015 -- patient is feeling much better and has been eating better and doing her offloading as much as possible. 10/23/2015 -- she has an appointment to see Dr. [name] tomorrow but other than that has been doing as much as possible with offloading and increasing her diet. 11/07/2015 -- she has not been  here to see [place] for 2 weeks and at the present time continues to try and eat better, work on off loading and is using the air mattress at night. She saw her PCP yesterday and she has put her back on a fentanyl patch. 11/21/2015 -- for some strange reason she has been sitting in a chair with her legs dependent for the last 6 days nonstop and has developed massive lower extremity edema and pedal edema with weeping and ulceration. [name], [name] ([id number]) Objective Constitutional Pulse regular. Respirations normal and unlabored. Afebrile. Vitals Time Taken: 3:15 PM, Height: 65 in, Weight: 100 lbs, BMI: 16.6, Temperature: 98.7 F, Pulse: 113 bpm, Respiratory Rate: 20 breaths/min, Blood Pressure: 136/63 mmHg. Eyes Nonicteric. Reactive to light. Ears, Nose, Mouth, and Throat Lips, teeth, and gums WNL.Marland Kitchen Moist mucosa without lesions. Neck supple and nontender. No palpable supraclavicular or cervical adenopathy. Normal sized without goiter. Respiratory WNL. No retractions.. Breath sounds WNL, No rubs, rales, rhonchi, or wheeze.. Cardiovascular Heart rhythm and rate regular, no murmur or gallop.. Pedal Pulses WNL. No clubbing, cyanosis or edema. Lymphatic No adneopathy. No adenopathy. No adenopathy. Musculoskeletal Adexa without tenderness or enlargement.. Digits and nails w/o clubbing, cyanosis, infection, petechiae, ischemia, or inflammatory conditions.Marland Kitchen Psychiatric Judgement and insight Intact.. No evidence of depression, anxiety, or agitation.. General Notes: the wounds all have significant amount of slough  which cannot be removed with a curette as she is too tender to sharply and debride this. 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 There is also significant weeping from both legs Integumentary (Hair, Skin) No suspicious lesions. No crepitus or fluctuance. No peri-wound warmth or  erythema. No masses.. Wound #10 status is Open. Original cause of wound was Gradually Appeared. The wound is located on the [place] ([id number]) Right,Medial Foot. The wound measures 0.3cm length x 0.3cm width x 0.1cm depth; 0.071cm^2 area and 0.007cm^3 volume. The wound is limited to skin breakdown. There is no tunneling or undermining noted. There is a medium amount of serous drainage noted. The wound margin is flat and intact. There is no granulation within the wound bed. There is a large (67-100%) amount of necrotic tissue within the wound bed including Adherent Slough. The periwound skin appearance exhibited: Moist, Erythema. The periwound skin appearance did not exhibit: Callus, Crepitus, Excoriation, Fluctuance, Friable, Induration, Localized Edema, Rash, Scarring, Dry/Scaly. The surrounding wound skin color is noted with erythema which is circumferential. Periwound temperature was noted as No Abnormality. The periwound has tenderness on palpation. Wound #11 status is Open. Original cause of wound was Pressure Injury. The wound is located on the Right,Distal Calcaneus. The wound measures 2cm length x 2cm width x 0.2cm depth; 3.142cm^2 area and 0.628cm^3 volume. The wound is limited to skin breakdown. There is no tunneling or undermining noted. There is a large amount of serous drainage noted. The wound margin is flat and intact. There is small (1-33%) pink granulation within the wound bed. There is a large (67-100%) amount of necrotic tissue within the wound bed including Adherent Slough. The periwound skin appearance exhibited: Maceration, Moist. The periwound skin appearance did not exhibit: Callus, Crepitus, Excoriation, Fluctuance, Friable, Induration, Localized Edema, Rash, Scarring, Dry/Scaly, Atrophie Blanche, Cyanosis, Ecchymosis, Hemosiderin Staining, Mottled, Pallor, Rubor, Erythema. Periwound temperature was noted as No Abnormality. The periwound has tenderness on  palpation. Wound #12 status is Healed - Epithelialized. Original cause of wound was Pressure Injury. The wound is located on the Right,Proximal Calcaneus. The wound measures 0cm length x 0cm width x 0cm depth; 0cm^2 area and 0cm^3 volume. Wound #13 status is Open. Original cause of wound was Pressure Injury. The wound is located on the Left Calcaneus. The wound measures 0.4cm length x 0.4cm width x 0.1cm depth; 0.126cm^2 area and 0.013cm^3 volume. The wound is limited to skin breakdown. There is no tunneling or undermining noted. There is a large amount of serous drainage noted. The wound margin is flat and intact. There is medium (34-66%) red, pink granulation within the wound bed. There is a medium (34-66%) amount of necrotic tissue within the wound bed including Adherent Slough. The periwound skin appearance exhibited: Maceration, Moist. Periwound temperature was noted as No Abnormality. The periwound has tenderness on palpation. Wound #14 status is Open. Original cause of wound was Blister. The wound is located on the Right,Anterior Lower Leg. The wound measures 2.3cm length x 0.5cm width x 0.1cm depth; 0.903cm^2 area and 0.09cm^3 volume. The wound is limited to skin breakdown. There is no tunneling or undermining noted. There is a large amount of serous drainage noted. The wound margin is flat and intact. There is large (67- 100%) red granulation within the wound bed. There is no necrotic tissue within the wound bed. The periwound skin appearance did not exhibit: Callus, Crepitus, Excoriation, Fluctuance, Friable, Induration, Localized Edema,  Rash, Scarring, Dry/Scaly, Maceration, Moist, Atrophie Blanche, Cyanosis, Ecchymosis, Hemosiderin Staining, Mottled, Pallor, Rubor, Erythema. The periwound has tenderness on palpation. Wound #3 status is Open. Original cause of wound was Pressure Injury. The wound is located on the Midline Back. The wound measures 2.2cm length x 2.5cm width x 0.2cm  depth; 4.32cm^2 area and 0.864cm^3 volume. The wound is limited to skin breakdown. There is no tunneling or undermining noted. There is a large amount of serosanguineous drainage noted. The wound margin is distinct with the outline attached to the wound base. There is medium (34-66%) red, pink granulation within the wound bed. There is a medium (34-66%) amount of necrotic tissue within the wound bed including Adherent Slough. The periwound skin appearance exhibited: Moist. The periwound skin appearance did not exhibit: Callus, Crepitus, Excoriation, Fluctuance, Friable, Induration, Localized Edema, Rash, Scarring, Dry/Scaly, Kynard, Elleah H. ([id number]) Maceration, Atrophie Blanche, Cyanosis, Ecchymosis, Hemosiderin Staining, Mottled, Pallor, Rubor, Erythema. Periwound temperature was noted as No Abnormality. Wound #7 status is Open. Original cause of wound was Pressure Injury. The wound is located on the Right Ischial Tuberosity. The wound measures 0.4cm length x 0.7cm width x 0.1cm depth; 0.22cm^2 area and 0.022cm^3 volume. The wound is limited to skin breakdown. There is no tunneling or undermining noted. There is a medium amount of serous drainage noted. The wound margin is distinct with the outline attached to the wound base. There is no granulation within the wound bed. There is a large (67-100%) amount of necrotic tissue within the wound bed including Eschar. The periwound skin appearance exhibited: Moist. The periwound skin appearance did not exhibit: Callus, Crepitus, Excoriation, Fluctuance, Friable, Induration, Localized Edema, Rash, Scarring, Dry/Scaly, Maceration, Atrophie Blanche, Cyanosis, Ecchymosis, Hemosiderin Staining, Mottled, Pallor, Rubor, Erythema. Periwound temperature was noted as No Abnormality. The periwound has tenderness on palpation. Wound #8 status is Open. Original cause of wound was Gradually Appeared. The wound is located on the Left Malleolus. The wound measures  1.2cm length x 0.9cm width x 0.3cm depth; 0.848cm^2 area and 0.254cm^3 volume. The wound is limited to skin breakdown. There is no tunneling or undermining noted. There is a medium amount of serous drainage noted. The wound margin is flat and intact. There is medium (34-66%) granulation within the wound bed. There is a medium (34-66%) amount of necrotic tissue within the wound bed including Adherent Slough. The periwound skin appearance exhibited: Moist, Erythema. The periwound skin appearance did not exhibit: Callus, Crepitus, Excoriation, Fluctuance, Friable, Induration, Localized Edema, Rash, Scarring, Dry/Scaly, Maceration, Atrophie Blanche, Cyanosis, Ecchymosis, Hemosiderin Staining, Mottled, Pallor, Rubor. The surrounding wound skin color is noted with erythema which is circumferential. Periwound temperature was noted as No Abnormality. The periwound has tenderness on palpation. Wound #9 status is Open. Original cause of wound was Gradually Appeared. The wound is located on the Left Ischial Tuberosity. The wound measures 0.5cm length x 0.6cm width x 0.1cm depth; 0.236cm^2 area and 0.024cm^3 volume. The wound is limited to skin breakdown. There is no tunneling or undermining noted. There is a medium amount of serous drainage noted. The wound margin is flat and intact. There is no granulation within the wound bed. There is a large (67-100%) amount of necrotic tissue within the wound bed including Adherent Slough. The periwound skin appearance exhibited: Moist. The periwound skin appearance did not exhibit: Callus, Crepitus, Excoriation, Fluctuance, Friable, Induration, Localized Edema, Rash, Scarring, Dry/Scaly, Maceration, Atrophie Blanche, Cyanosis, Ecchymosis, Hemosiderin Staining, Mottled, Pallor, Rubor, Erythema. Periwound temperature was noted as No Abnormality. Assessment Active Problems ICD-10  E11.621 - Type 2 diabetes mellitus with foot ulcer L89.613 - Pressure ulcer of right  heel, stage 3 F17.218 - Nicotine dependence, cigarettes, with other nicotine-induced disorders L89.100 - Pressure ulcer of unspecified part of back, unstageable L89.213 - Pressure ulcer of right hip, stage 3 I89.0 - Lymphedema, not elsewhere classified [name], [name] ([id number]) L97.211 - Non-pressure chronic ulcer of right calf limited to breakdown of skin Plan Wound Cleansing: Wound #10 Right,Medial Foot: Clean wound with Normal Saline. Cleanse wound with mild soap and water Wound #11 Right,Distal Calcaneus: Clean wound with Normal Saline. Cleanse wound with mild soap and water Wound #13 Left Calcaneus: Clean wound with Normal Saline. Cleanse wound with mild soap and water Wound #14 Right,Anterior Lower Leg: Clean wound with Normal Saline. Cleanse wound with mild soap and water Wound #3 Midline Back: Clean wound with Normal Saline. Cleanse wound with mild soap and water Wound #7 Right Ischial Tuberosity: Clean wound with Normal Saline. Cleanse wound with mild soap and water Wound #8 Left Malleolus: Clean wound with Normal Saline. Cleanse wound with mild soap and water Wound #9 Left Ischial Tuberosity: Clean wound with Normal Saline. Cleanse wound with mild soap and water Anesthetic: Wound #10 Right,Medial Foot: Topical Lidocaine 4% cream applied to wound bed prior to debridement Wound #11 Right,Distal Calcaneus: Topical Lidocaine 4% cream applied to wound bed prior to debridement Wound #13 Left Calcaneus: Topical Lidocaine 4% cream applied to wound bed prior to debridement Wound #14 Right,Anterior Lower Leg: Topical Lidocaine 4% cream applied to wound bed prior to debridement Wound #3 Midline Back: Topical Lidocaine 4% cream applied to wound bed prior to debridement Wound #7 Right Ischial Tuberosity: Topical Lidocaine 4% cream applied to wound bed prior to debridement Wound #8 Left Malleolus: Topical Lidocaine 4% cream applied to wound bed prior to debridement Wound  #9 Left Ischial Tuberosity: [place], [name] ([id number]) Topical Lidocaine 4% cream applied to wound bed prior to debridement Skin Barriers/Peri-Wound Care: Wound #10 Right,Medial Foot: Skin Prep Wound #11 Right,Distal Calcaneus: Skin Prep Wound #13 Left Calcaneus: Skin Prep Wound #14 Right,Anterior Lower Leg: Skin Prep Wound #3 Midline Back: Skin Prep Wound #7 Right Ischial Tuberosity: Skin Prep Wound #8 Left Malleolus: Skin Prep Wound #9 Left Ischial Tuberosity: Skin Prep Primary Wound Dressing: Wound #10 Right,Medial Foot: Santyl Ointment Wound #11 Right,Distal Calcaneus: Santyl Ointment Wound #13 Left Calcaneus: Santyl Ointment Wound #3 Midline Back: Santyl Ointment Wound #7 Right Ischial Tuberosity: Santyl Ointment Wound #8 Left Malleolus: Santyl Ointment Wound #9 Left Ischial Tuberosity: Aquacel Ag ABD Pad - any draining areas XtraSorb - as needed to both lower legs - any draining areas Secondary Dressing: Wound #10 Right,Medial Foot: Boardered Foam Dressing Wound #11 Right,Distal Calcaneus: Boardered Foam Dressing Wound #13 Left Calcaneus: Boardered Foam Dressing Wound #3 Midline Back: Boardered Foam Dressing Wound #7 Right Ischial Tuberosity: Boardered Foam Dressing Wound #8 Left Malleolus: Boardered Foam Dressing Wound #9 Left Ischial Tuberosity: Boardered Foam Dressing Wound #14 Right,Anterior Lower Leg: [name], [name] ([id number]) Conform/Kerlix Dressing Change Frequency: Wound #10 Right,Medial Foot: Change dressing every day. Wound #11 Right,Distal Calcaneus: Change dressing every day. Wound #13 Left Calcaneus: Change dressing every day. Wound #14 Right,Anterior Lower Leg: Change dressing every day. Wound #3 Midline Back: Change dressing every day. Wound #7 Right Ischial Tuberosity: Change dressing every day. Wound #8 Left Malleolus: Change dressing every day. Wound #9 Left Ischial Tuberosity: Change dressing every day. Follow-up  Appointments: Wound #10 Right,Medial Foot: Return Appointment in 1 week. Wound #11 Right,Distal  Calcaneus: Return Appointment in 1 week. Wound #13 Left Calcaneus: Return Appointment in 1 week. Wound #14 Right,Anterior Lower Leg: Return Appointment in 1 week. Wound #3 Midline Back: Return Appointment in 1 week. Wound #7 Right Ischial Tuberosity: Return Appointment in 1 week. Wound #8 Left Malleolus: Return Appointment in 1 week. Wound #9 Left Ischial Tuberosity: Return Appointment in 1 week. Edema Control: Wound #9 Left Ischial Tuberosity: Elevate legs to the level of the heart and pump ankles as often as possible Off-Loading: Wound #10 Right,Medial Foot: Turn and reposition every 2 hours Other: - float heels while in the bed Wound #11 Right,Distal Calcaneus: Turn and reposition every 2 hours Other: - float heels while in the bed Wound #13 Left Calcaneus: Turn and reposition every 2 hours Other: - float heels while in the bed Wound #3 Midline Back: Turn and reposition every 2 hours Other: - float heels while in the bed [name], [name] ([id number]) Wound #7 Right Ischial Tuberosity: Turn and reposition every 2 hours Other: - float heels while in the bed Wound #8 Left Malleolus: Turn and reposition every 2 hours Other: - float heels while in the bed Wound #9 Left Ischial Tuberosity: Turn and reposition every 2 hours Other: - float heels while in the bed Home Health: Wound #10 Right,Medial Foot: [place] Visits - [facility] Nurse may visit PRN to address patient s wound care needs. FACE TO FACE ENCOUNTER: MEDICARE and MEDICAID PATIENTS: I certify that this patient is under my care and that I had a face-to-face encounter that meets the physician face-to-face encounter requirements with this patient on this date. The encounter with the patient was in whole or in part for the following MEDICAL CONDITION: (primary reason for [place]) MEDICAL  NECESSITY: I certify, that based on my findings, NURSING services are a medically necessary home health service. HOME BOUND STATUS: I certify that my clinical findings support that this patient is homebound (i.e., Due to illness or injury, pt requires aid of supportive devices such as crutches, cane, wheelchairs, walkers, the use of special transportation or the assistance of another person to leave their place of residence. There is a normal inability to leave the home and doing so requires considerable and taxing effort. Other absences are for medical reasons / religious services and are infrequent or of short duration when for other reasons). If current dressing causes regression in wound condition, may D/C ordered dressing product/s and apply Normal Saline Moist Dressing daily until next [place] / Other MD appointment. [place] of regression in wound condition at [phone]. Please direct any NON-WOUND related issues/requests for orders to patient's Primary Care Physician Wound #11 Right,Distal Calcaneus: [place] Visits - [facility] Nurse may visit PRN to address patient s wound care needs. FACE TO FACE ENCOUNTER: MEDICARE and MEDICAID PATIENTS: I certify that this patient is under my care and that I had a face-to-face encounter that meets the physician face-to-face encounter requirements with this patient on this date. The encounter with the patient was in whole or in part for the following MEDICAL CONDITION: (primary reason for [place]) MEDICAL NECESSITY: I certify, that based on my findings, NURSING services are a medically necessary home health service. HOME BOUND STATUS: I certify that my clinical findings support that this patient is homebound (i.e., Due to illness or injury, pt requires aid of supportive devices such as crutches, cane, wheelchairs, walkers, the use of special transportation or the assistance  of another  person to leave their place of residence. There is a normal inability to leave the home and doing so requires considerable and taxing effort. Other absences are for medical reasons / religious services and are infrequent or of short duration when for other reasons). If current dressing causes regression in wound condition, may D/C ordered dressing product/s and apply Normal Saline Moist Dressing daily until next [place] / Other MD appointment. [place] of regression in wound condition at [phone]. Please direct any NON-WOUND related issues/requests for orders to patient's Primary Care Physician Wound #13 Left Calcaneus: [place] Visits - [facility] Nurse may visit PRN to address patient s wound care needs. FACE TO FACE ENCOUNTER: MEDICARE and MEDICAID PATIENTS: I certify that this patient is under my care and that I had a face-to-face encounter that meets the physician face-to-face encounter requirements with this patient on this date. The encounter with the patient was in whole or in part for the following MEDICAL CONDITION: (primary reason for [place]) MEDICAL NECESSITY: I certify, Mcewen, Ica H. ([id number]) that based on my findings, NURSING services are a medically necessary home health service. HOME BOUND STATUS: I certify that my clinical findings support that this patient is homebound (i.e., Due to illness or injury, pt requires aid of supportive devices such as crutches, cane, wheelchairs, walkers, the use of special transportation or the assistance of another person to leave their place of residence. There is a normal inability to leave the home and doing so requires considerable and taxing effort. Other absences are for medical reasons / religious services and are infrequent or of short duration when for other reasons). If current dressing causes regression in wound condition, may D/C ordered dressing product/s and  apply Normal Saline Moist Dressing daily until next [place] / Other MD appointment. [place] of regression in wound condition at [phone]. Please direct any NON-WOUND related issues/requests for orders to patient's Primary Care Physician Wound #14 Right,Anterior Lower Leg: [place] Visits - [facility] Nurse may visit PRN to address patient s wound care needs. FACE TO FACE ENCOUNTER: MEDICARE and MEDICAID PATIENTS: I certify that this patient is under my care and that I had a face-to-face encounter that meets the physician face-to-face encounter requirements with this patient on this date. The encounter with the patient was in whole or in part for the following MEDICAL CONDITION: (primary reason for [place]) MEDICAL NECESSITY: I certify, that based on my findings, NURSING services are a medically necessary home health service. HOME BOUND STATUS: I certify that my clinical findings support that this patient is homebound (i.e., Due to illness or injury, pt requires aid of supportive devices such as crutches, cane, wheelchairs, walkers, the use of special transportation or the assistance of another person to leave their place of residence. There is a normal inability to leave the home and doing so requires considerable and taxing effort. Other absences are for medical reasons / religious services and are infrequent or of short duration when for other reasons). If current dressing causes regression in wound condition, may D/C ordered dressing product/s and apply Normal Saline Moist Dressing daily until next [place] / Other MD appointment. [place] of regression in wound condition at [phone]. Please direct any NON-WOUND related issues/requests for orders to patient's Primary Care Physician Wound #3 Midline Back: [place] Visits - [facility] Nurse may visit PRN  to address patient  s wound care needs. FACE TO FACE ENCOUNTER: MEDICARE and MEDICAID PATIENTS: I certify that this patient is under my care and that I had a face-to-face encounter that meets the physician face-to-face encounter requirements with this patient on this date. The encounter with the patient was in whole or in part for the following MEDICAL CONDITION: (primary reason for [place]) MEDICAL NECESSITY: I certify, that based on my findings, NURSING services are a medically necessary home health service. HOME BOUND STATUS: I certify that my clinical findings support that this patient is homebound (i.e., Due to illness or injury, pt requires aid of supportive devices such as crutches, cane, wheelchairs, walkers, the use of special transportation or the assistance of another person to leave their place of residence. There is a normal inability to leave the home and doing so requires considerable and taxing effort. Other absences are for medical reasons / religious services and are infrequent or of short duration when for other reasons). If current dressing causes regression in wound condition, may D/C ordered dressing product/s and apply Normal Saline Moist Dressing daily until next [place] / Other MD appointment. [place] of regression in wound condition at [phone]. Please direct any NON-WOUND related issues/requests for orders to patient's Primary Care Physician Wound #7 Right Ischial Tuberosity: [place] Visits - [facility] Nurse may visit PRN to address patient s wound care needs. FACE TO FACE ENCOUNTER: MEDICARE and MEDICAID PATIENTS: I certify that this patient is under my care and that I had a face-to-face encounter that meets the physician face-to-face encounter requirements with this patient on this date. The encounter with the patient was in whole or in part for the following MEDICAL CONDITION: (primary reason for [place])  MEDICAL NECESSITY: I certify, Librizzi, Lorrayne H. ([id number]) that based on my findings, NURSING services are a medically necessary home health service. HOME BOUND STATUS: I certify that my clinical findings support that this patient is homebound (i.e., Due to illness or injury, pt requires aid of supportive devices such as crutches, cane, wheelchairs, walkers, the use of special transportation or the assistance of another person to leave their place of residence. There is a normal inability to leave the home and doing so requires considerable and taxing effort. Other absences are for medical reasons / religious services and are infrequent or of short duration when for other reasons). If current dressing causes regression in wound condition, may D/C ordered dressing product/s and apply Normal Saline Moist Dressing daily until next [place] / Other MD appointment. [place] of regression in wound condition at [phone]. Please direct any NON-WOUND related issues/requests for orders to patient's Primary Care Physician Wound #8 Left Malleolus: [place] Visits - [facility] Nurse may visit PRN to address patient s wound care needs. FACE TO FACE ENCOUNTER: MEDICARE and MEDICAID PATIENTS: I certify that this patient is under my care and that I had a face-to-face encounter that meets the physician face-to-face encounter requirements with this patient on this date. The encounter with the patient was in whole or in part for the following MEDICAL CONDITION: (primary reason for [place]) MEDICAL NECESSITY: I certify, that based on my findings, NURSING services are a medically necessary home health service. HOME BOUND STATUS: I certify that my clinical findings support that this patient is homebound (i.e., Due to illness or injury, pt requires aid of supportive devices such as crutches, cane, wheelchairs,  walkers, the use of special transportation or  the assistance of another person to leave their place of residence. There is a normal inability to leave the home and doing so requires considerable and taxing effort. Other absences are for medical reasons / religious services and are infrequent or of short duration when for other reasons). If current dressing causes regression in wound condition, may D/C ordered dressing product/s and apply Normal Saline Moist Dressing daily until next [place] / Other MD appointment. [place] of regression in wound condition at [phone]. Please direct any NON-WOUND related issues/requests for orders to patient's Primary Care Physician Wound #9 Left Ischial Tuberosity: [place] Visits - [facility] Nurse may visit PRN to address patient s wound care needs. FACE TO FACE ENCOUNTER: MEDICARE and MEDICAID PATIENTS: I certify that this patient is under my care and that I had a face-to-face encounter that meets the physician face-to-face encounter requirements with this patient on this date. The encounter with the patient was in whole or in part for the following MEDICAL CONDITION: (primary reason for [place]) MEDICAL NECESSITY: I certify, that based on my findings, NURSING services are a medically necessary home health service. HOME BOUND STATUS: I certify that my clinical findings support that this patient is homebound (i.e., Due to illness or injury, pt requires aid of supportive devices such as crutches, cane, wheelchairs, walkers, the use of special transportation or the assistance of another person to leave their place of residence. There is a normal inability to leave the home and doing so requires considerable and taxing effort. Other absences are for medical reasons / religious services and are infrequent or of short duration when for other reasons). If current dressing causes regression in wound condition, may D/C ordered dressing product/s and  apply Normal Saline Moist Dressing daily until next [place] / Other MD appointment. [place] of regression in wound condition at [phone]. Please direct any NON-WOUND related issues/requests for orders to patient's Primary Care Physician Sissel, [place]. ([id number]) I have recommended we use Santyl over all the wounds with slough and those with healthy granulation tissue we'll use Prisma [employer]. I have also asked her to continue to offload as well as possible. She has been urged to elevate her limbs all through the day and night and in the meanwhile we will treat the lymphedema with some extrasorb and a light Kerlix gauze. We have again discussed offloading, vitamin supplements and increase protein and she is agreeable to use this. We will see her back next week. Electronic Signature(s) Signed: 11/21/2015 4:18:40 PM By: Christin Fudge MD, FACS Entered By: Christin Fudge on 11/21/2015 16:18:40 Loflin, Samiyyah H. ([id number]) -------------------------------------------------------------------------------- SuperBill Details Patient Name: [name], Trixie H. Date of Service: 11/21/2015 Medical Record Number: [id number] Patient Account Number: [id number] Date of Birth/Sex: 05-04-1946 (69 y.o. 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, stage 3 I89.0 Lymphedema, not elsewhere classified L97.211 Non-pressure chronic ulcer of right calf limited to breakdown of skin Facility Procedures CPT4 Code:  [id number] Description: [id number] - WOUND CARE VISIT-LEV 5 EST PT Modifier: Quantity: 1 Physician Procedures  CPT4 Code: [id number] Description: [id number] - WC PHYS LEVEL 3 - EST PT ICD-10 Description Diagnosis 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

## 2015-11-22 NOTE — Progress Notes (Signed)
GALENTINE, Roopa H. ([id number]) Visit Report for 11/21/2015 Arrival Information Details Patient Name: FOLGER, Anaid H. Date of Service: 11/21/2015 2:45 PM Medical Record Number: [id number] Patient Account Number: [id number] Date of Birth/Sex: Apr 12, 1946 (69 y.o. Female) Treating RN: Montey Hora Primary Care Physician: Paulita Cradle Other Clinician: Referring Physician: Paulita Cradle Treating Physician/Extender: Frann Rider in Treatment: 47 Visit Information History Since Last Visit Added or deleted any medications: No Patient Arrived: Wheel Chair Any new allergies or adverse reactions: No Arrival Time: 15:08 Had a fall or experienced change in No activities of daily living that may affect Accompanied By: spouse risk of falls: Transfer Assistance: Manual Signs or symptoms of abuse/neglect since last No Patient Identification Verified: Yes visito Secondary Verification Process Yes Hospitalized since last visit: No Completed: Pain Present Now: Yes Patient Requires Transmission-Based No Precautions: Patient Has Alerts: No Electronic Signature(s) Signed: 11/21/2015 7:01:59 PM By: Montey Hora Entered By: Montey Hora on 11/21/2015 15:14:53 Borg, Kenlyn H. ([id number]) -------------------------------------------------------------------------------- Clinic Level of Care Assessment Details Patient Name: Romaniello, Chardai H. Date of Service: 11/21/2015 2:45 PM Medical Record Number: [id number] Patient Account Number: [id number] Date of Birth/Sex: [birth year] (69 y.o. Female) Treating RN: Montey Hora Primary Care Physician: Paulita Cradle Other Clinician: Referring Physician: Paulita Cradle Treating Physician/Extender: Frann Rider in Treatment: 36 Clinic Level of Care Assessment Items TOOL 4 Quantity Score []  - Use when only an EandM is performed on FOLLOW-UP visit 0 ASSESSMENTS - Nursing Assessment / Reassessment X - Reassessment of Co-morbidities  (includes updates in patient status) 1 10 X - Reassessment of Adherence to Treatment Plan 1 5 ASSESSMENTS - Wound and Skin Assessment / Reassessment []  - Simple Wound Assessment / Reassessment - one wound 0 X - Complex Wound Assessment / Reassessment - multiple wounds 9 5 []  - Dermatologic / Skin Assessment (not related to wound area) 0 ASSESSMENTS - Focused Assessment []  - Circumferential Edema Measurements - multi extremities 0 []  - Nutritional Assessment / Counseling / Intervention 0 X - Lower Extremity Assessment (monofilament, tuning fork, pulses) 1 5 []  - Peripheral Arterial Disease Assessment (using hand held doppler) 0 ASSESSMENTS - Ostomy and/or Continence Assessment and Care []  - Incontinence Assessment and Management 0 []  - Ostomy Care Assessment and Management (repouching, etc.) 0 PROCESS - Coordination of Care X - Simple Patient / Family Education for ongoing care 1 15 []  - Complex (extensive) Patient / Family Education for ongoing care 0 []  - Staff obtains Programmer, systems, Records, Test Results / Process Orders 0 []  - Staff telephones HHA, Nursing Homes / Clarify orders / etc 0 []  - Routine Transfer to another Facility (non-emergent condition) 0 Wisnewski, Minta H. ([id number]) []  - Routine Hospital Admission (non-emergent condition) 0 []  - New Admissions / Biomedical engineer / Ordering NPWT, Apligraf, etc. 0 []  - Emergency Hospital Admission (emergent condition) 0 X - Simple Discharge Coordination 1 10 []  - Complex (extensive) Discharge Coordination 0 PROCESS - Special Needs []  - Pediatric / Minor Patient Management 0 []  - Isolation Patient Management 0 []  - Hearing / Language / Visual special needs 0 []  - Assessment of Community assistance (transportation, D/C planning, etc.) 0 []  - Additional assistance / Altered mentation 0 []  - Support Surface(s) Assessment (bed, cushion, seat, etc.) 0 INTERVENTIONS - Wound Cleansing / Measurement []  - Simple Wound Cleansing - one wound  0 X - Complex Wound Cleansing - multiple wounds 9 5 X - Wound Imaging (photographs - any number of wounds) 1 5 []  - Wound Tracing (instead of photographs) 0 []  -  Simple Wound Measurement - one wound 0 X - Complex Wound Measurement - multiple wounds 9 5 INTERVENTIONS - Wound Dressings X - Small Wound Dressing one or multiple wounds 8 10 []  - Medium Wound Dressing one or multiple wounds 0 []  - Large Wound Dressing one or multiple wounds 0 []  - Application of Medications - topical 0 []  - Application of Medications - injection 0 INTERVENTIONS - Miscellaneous []  - External ear exam 0 Dancel, Ashonte H. ([id number]) []  - Specimen Collection (cultures, biopsies, blood, body fluids, etc.) 0 []  - Specimen(s) / Culture(s) sent or taken to Lab for analysis 0 []  - Patient Transfer (multiple staff / [name] / Similar devices) 0 []  - Simple Staple / Suture removal (25 or less) 0 []  - Complex Staple / Suture removal (26 or more) 0 []  - Hypo / Hyperglycemic Management (close monitor of Blood Glucose) 0 []  - Ankle / Brachial Index (ABI) - do not check if billed separately 0 X - Vital Signs 1 5 Has the patient been seen at the hospital within the last three years: Yes Total Score: 270 Level Of Care: New/Established - Level 5 Electronic Signature(s) Signed: 11/21/2015 6:21:22 PM By: Montey Hora Entered By: Montey Hora on 11/21/2015 18:21:22 Henri, Federica H. ([id number]) -------------------------------------------------------------------------------- Encounter Discharge Information Details Patient Name: [name], Asjia H. Date of Service: 11/21/2015 2:45 PM Medical Record Number: [id number] Patient Account Number: [id number] Date of Birth/Sex: Feb 16, 1946 (69 y.o. Female) Treating RN: Montey Hora Primary Care Physician: Paulita Cradle Other Clinician: Referring Physician: Paulita Cradle Treating Physician/Extender: Frann Rider in Treatment: 10 Encounter Discharge Information  Items Discharge Pain Level: 0 Discharge Condition: Stable Ambulatory Status: Wheelchair Discharge Destination: Home Transportation: Private Auto Accompanied By: spouse Schedule Follow-up Appointment: Yes Medication Reconciliation completed and provided to Patient/Care No Raylee Strehl: Provided on Clinical Summary of Care: 11/21/2015 Form Type Recipient Paper Patient AS Electronic Signature(s) Signed: 11/21/2015 6:26:41 PM By: Montey Hora Previous Signature: 11/21/2015 4:32:26 PM Version By: Sharon Mt Entered By: Montey Hora on 11/21/2015 18:26:41 Robins, Adana H. ([id number]) -------------------------------------------------------------------------------- Multi Wound Chart Details Patient Name: [name], Amoria H. Date of Service: 11/21/2015 2:45 PM Medical Record Number: [id number] Patient Account Number: [id number] Date of Birth/Sex: [birth year] (69 y.o. 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] [11:Pressure Ulcer] [12:Pressure Ulcer] Comorbid History: [10:Cataracts, Asthma, Hypertension, Type II Diabetes, Neuropathy, Received Chemotherapy] [11:Cataracts, Asthma, Hypertension, Type II Diabetes, Neuropathy, Received Chemotherapy] [12:N/A] Date Acquired: [10:09/12/2015] [11:09/29/2015] [12:11/07/2015] Weeks of Treatment: [10:9] [11:7] [12:2] Wound Status: [10:Open] [11:Open]  [12:Healed - Epithelialized] Measurements L x W x D 0.3x0.3x0.1 [11:2x2x0.2] [12:0x0x0] (cm) Area (cm) : [10:0.071] [11:3.142] [12:0] Volume (cm) : [10:0.007] [11:0.628] [12:0] % Reduction in Area: [10:69.90%] [11:-344.40%] [12:100.00%] % Reduction in Volume: 70.80% [11:-784.50%] [12:100.00%] Classification: [10:Category/Stage II] [11:Category/Stage II] [12:Category/Stage II] HBO Classification: [10:Grade 1] [11:Grade 1] [12:N/A] Exudate Amount: [10:Medium] [11:Large] [12:N/A] Exudate Type: [10:Serous] [11:Serous] [12:N/A] Exudate Color: [10:amber] [11:amber] [12:N/A] Wound Margin: [10:Flat and Intact] [11:Flat and Intact] [12:N/A] Granulation Amount: [10:None Present (0%)] [11:Small (1-33%)] [12:N/A] Granulation Quality: [10:N/A] [11:Pink] [12:N/A] Necrotic Amount: [10:Large (67-100%)] [11:Large (67-100%)] [12:N/A] Necrotic Tissue: [10:Adherent Slough] [  11:Adherent Slough] [12:N/A] Exposed Structures: [10:Fascia: No Fat: No Tendon: No] [11:Fascia: No Fat: No Tendon: No] [12:N/A] Muscle: No Muscle: No Joint: No Joint: No Bone: No Bone: No Limited to Skin Limited to Skin Breakdown Breakdown Epithelialization: None None N/A Periwound Skin Texture: Edema: No Edema: No No Abnormalities Noted Excoriation: No Excoriation: No Induration: No Induration: No Callus: No Callus: No Crepitus: No Crepitus: No Fluctuance: No Fluctuance: No Friable: No Friable: No Rash: No Rash: No Scarring: No Scarring: No Periwound Skin Moist: Yes Maceration: Yes No Abnormalities Noted Moisture: Dry/Scaly: No Moist: Yes Dry/Scaly: No Periwound Skin Color: Erythema: Yes Atrophie Blanche: No No Abnormalities Noted Cyanosis: No Ecchymosis: No Erythema: No Hemosiderin Staining: No Mottled: No Pallor: No Rubor: No Erythema Location: Circumferential N/A N/A Temperature: No Abnormality No Abnormality N/A Tenderness on Yes Yes No Palpation: Wound Preparation: Ulcer Cleansing: Ulcer  Cleansing: N/A Rinsed/Irrigated with Rinsed/Irrigated with Saline Saline Topical Anesthetic Topical Anesthetic Applied: Other: lidocaine Applied: Other: lidocaine 4% 4% Wound Number: [id number]  Photos: No Photos No Photos No Photos Wound Location: Left Calcaneus Right Lower Leg - Anterior Back - Midline Wounding Event: Pressure Injury Blister Pressure Injury 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 ([id number]) Measurements L x W x D 0.4x0.4x0.1 2.3x0.5x0.1 2.2x2.5x0.2 (cm) Area (cm) : 0.126 0.903 4.32 Volume (cm) : 0.013 0.09 0.864 % Reduction in Area: 91.10% 0.00% -1863.60% % Reduction in Volume: 90.80% 0.00% -3827.30% Classification: Category/Stage II Partial Thickness Category/Stage II HBO Classification: Grade 1 Grade 1 N/A Exudate Amount: Large Large Large Exudate Type: Serous Serous Serosanguineous Exudate Color: amber amber red, brown Wound Margin: Flat and Intact Flat and Intact Distinct, outline attached Granulation Amount: Medium (34-66%) Large (67-100%) Medium (34-66%) Granulation Quality: Red, Pink Red Red, Pink Necrotic Amount: Medium (34-66%) None Present (0%) Medium (34-66%) Necrotic Tissue: Adherent Conway Exposed Structures: Fascia: No Fascia: No Fascia: No Fat: No Fat: No Fat: No Tendon: No Tendon: No Tendon: No Muscle: No Muscle: No Muscle: No Joint: No Joint: No Joint: No Bone: No Bone: No Bone: No Limited to Skin Limited to Skin Limited to Skin Breakdown Breakdown Breakdown Epithelialization: None Small (1-33%)  None Periwound Skin Texture: No Abnormalities Noted Edema: No Edema: No Excoriation: No Excoriation: No Induration: No Induration: No Callus: No Callus: No Crepitus: No Crepitus: No Fluctuance: No Fluctuance: No Friable: No Friable: No Rash: No Rash: No Scarring: No Scarring: No Periwound Skin Maceration: Yes Maceration: No Moist: Yes Moisture: Moist: Yes Moist: No Maceration: No Dry/Scaly: No Dry/Scaly: No Periwound Skin Color: No Abnormalities Noted Atrophie Blanche: No Atrophie Blanche: No Cyanosis: No Cyanosis: No Ecchymosis: No Ecchymosis: No Erythema: No Erythema: No Hemosiderin Staining: No Hemosiderin Staining: No Mottled: No Mottled: No Pallor: No Pallor: No Rubor: No Rubor: No Erythema Location: N/A N/A N/A Temperature: No Abnormality N/A No Abnormality Tenderness on Yes Yes No Palpation: Wound Preparation: Bloor, Minka H. 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 Comorbid History: Cataracts, Asthma, Cataracts, Asthma, Cataracts, Asthma, Hypertension, Type II Hypertension, Type II Hypertension, Type II Diabetes, Neuropathy, Diabetes, Neuropathy, Diabetes, Neuropathy, Received Chemotherapy Received Chemotherapy Received Chemotherapy Date Acquired: 08/01/2015 09/12/2015 09/12/2015 Weeks of Treatment: 15 9 9  Wound Status: Open Open Open Measurements L x W x D 0.4x0.7x0.1 1.2x0.9x0.3 0.5x0.6x0.1 (cm) Area (cm) : 0.22 0.848 0.236 Volume  (cm) : 0.022 0.254 0.024 % Reduction in Area: 93.00% -1704.30% -20.40% % Reduction in Volume: 93.00% -2722.20% -20.00% Classification: Category/Stage III Category/Stage II Category/Stage II HBO Classification: N/A Grade 0 N/A Exudate Amount: Medium Medium Medium Exudate Type: Serous Serous Serous Exudate Color: amber amber amber Wound Margin: Distinct, outline attached Flat and Intact Flat and Intact Granulation Amount: None Present (0%) Medium (34-66%) None Present (0%) Granulation Quality: N/A N/A N/A Necrotic Amount: Large (67-100%) Medium (34-66%) Large (67-100%) Necrotic Tissue: Eschar Adherent Slough Adherent Slough Exposed Structures: Fascia: No Fascia: No Fascia: No Fat: No Fat: No Fat: No Tendon: No Tendon: No Tendon: No Muscle: No Muscle: No Muscle: No Joint: No Joint: No Joint: No Bone: No Bone: No Bone: No Limited to Skin Limited to Skin Limited to Skin Breakdown Breakdown Breakdown Epithelialization: None None None Periwound Skin Texture: Edema: No Edema: No Edema: No Excoriation: No Excoriation: No Excoriation: No Induration: No Induration: No Induration: No Callus: No Callus: No Callus: No Estupinan, Amye H. ([id number]) Crepitus: No Crepitus: No Crepitus: No Fluctuance: No Fluctuance: No Fluctuance: No Friable: No Friable: No Friable: No Rash: No Rash: No Rash: No Scarring: No Scarring: No Scarring: No Periwound Skin Moist: Yes Moist: Yes Moist: Yes Moisture: Maceration: No Maceration: No Maceration: No Dry/Scaly: No Dry/Scaly: No Dry/Scaly: No Periwound Skin Color: Atrophie Blanche: No Erythema: Yes Atrophie Blanche: No Cyanosis: No Atrophie Blanche: No Cyanosis: No Ecchymosis: No Cyanosis: No Ecchymosis: No Erythema: No Ecchymosis: No Erythema: No Hemosiderin Staining: No Hemosiderin Staining: No Hemosiderin Staining: No Mottled: No Mottled: No Mottled: No Pallor: No Pallor: No Pallor: No Rubor: No Rubor:  No Rubor: No Erythema Location: N/A Circumferential N/A Temperature: No Abnormality No Abnormality No Abnormality Tenderness on Yes Yes No Palpation: Wound Preparation: Ulcer Cleansing: Ulcer Cleansing: Ulcer Cleansing: Rinsed/Irrigated with Rinsed/Irrigated with Rinsed/Irrigated with Saline Saline Saline Topical Anesthetic Topical Anesthetic Topical Anesthetic Applied: Other: lidocaine Applied: Other: Lidocaine Applied: Other: lidocaine 4% 4% 4% Treatment Notes Electronic Signature(s) Signed: 11/21/2015 7:01:59 PM By: Montey Hora Entered By: Montey Hora on 11/21/2015 15:51:15 Oplinger, Rosetta H. ([id number]) -------------------------------------------------------------------------------- [place] Details Patient Name: [name], Mylie H. Date of Service: 11/21/2015 2:45 PM Medical Record Number: [id number] Patient Account Number: [id number] Date of Birth/Sex: Jan 18, 1946 (69 y.o. Female) Treating RN: Montey Hora Primary Care Physician: Paulita Cradle Other Clinician: Referring Physician: Paulita Cradle Treating Physician/Extender: Frann Rider in Treatment: 19 Active Inactive Abuse / Safety / Falls / Self Care Management Nursing Diagnoses: Impaired home maintenance Impaired physical mobility Knowledge deficit related to: safety; personal, health (wound), emergency Potential for falls Self care deficit: actual or potential Goals: Patient will remain injury free Date Initiated: 03/13/2015 Goal Status: Active Patient/caregiver will verbalize understanding of skin care regimen Date Initiated: 03/13/2015 Goal Status: Active Patient/caregiver will verbalize/demonstrate measure taken to improve self care Date Initiated: 03/13/2015 Goal Status: Active Patient/caregiver will verbalize/demonstrate measures taken to improve the patient's personal safety Date Initiated: 03/13/2015 Goal Status: Active Patient/caregiver will verbalize/demonstrate  measures taken to prevent injury and/or falls Date Initiated: 03/13/2015  Goal Status: Active Patient/caregiver will verbalize/demonstrate understanding of what to do in case of emergency Date Initiated: 03/13/2015 Goal Status: Active Interventions: Assess fall risk on admission and as needed Assess: immobility, friction, shearing, incontinence upon admission and as needed Assess impairment of mobility on admission and as needed per policy Assess self care needs on admission and as needed Provide education on basic hygiene Porro, Ashayla H. ([id number]) Provide education on fall prevention Provide education on personal and home safety Provide education on safe transfers Treatment Activities: Education provided on Basic Hygiene : 03/13/2015 Notes: Orientation to the Wound Care Program Nursing Diagnoses: Knowledge deficit related to the wound healing center program Goals: Patient/caregiver will verbalize understanding of the [place] Date Initiated: 03/13/2015 Goal Status: Active Interventions: Provide education on orientation to the wound center Notes: Pressure Nursing Diagnoses: Knowledge deficit related to causes and risk factors for pressure ulcer development Knowledge deficit related to management of pressures ulcers Potential for impaired tissue integrity related to pressure, friction, moisture, and shear Goals: Patient will remain free from development of additional pressure ulcers Date Initiated: 03/13/2015 Goal Status: Active Patient will remain free of pressure ulcers Date Initiated: 03/13/2015 Goal Status: Active Patient/caregiver will verbalize risk factors for pressure ulcer development Date Initiated: 03/13/2015 Goal Status: Active Patient/caregiver will verbalize understanding of pressure ulcer management Date Initiated: 03/13/2015 Goal Status: Active Interventions: Assess: immobility, friction, shearing, incontinence upon admission and as  needed Tipler, Vernal H. ([id number]) Assess offloading mechanisms upon admission and as needed Assess potential for pressure ulcer upon admission and as needed Provide education on pressure ulcers Treatment Activities: Patient referred for home evaluation of offloading devices/mattresses : 11/21/2015 Patient referred for pressure reduction/relief devices : 11/21/2015 Patient referred for seating evaluation to ensure proper offloading : 11/21/2015 Pressure reduction/relief device ordered : 11/21/2015 Test ordered outside of clinic : 11/21/2015 Notes: Wound/Skin Impairment Nursing Diagnoses: Impaired tissue integrity Knowledge deficit related to ulceration/compromised skin integrity Goals: Patient/caregiver will verbalize understanding of skin care regimen Date Initiated: 03/13/2015 Goal Status: Active Ulcer/skin breakdown will have a volume reduction of 30% by week 4 Date Initiated: 03/13/2015 Goal Status: Active Ulcer/skin breakdown will have a volume reduction of 50% by week 8 Date Initiated: 03/13/2015 Goal Status: Active Ulcer/skin breakdown will have a volume reduction of 80% by week 12 Date Initiated: 03/13/2015 Goal Status: Active Ulcer/skin breakdown will heal within 14 weeks Date Initiated: 03/13/2015 Goal Status: Active Interventions: Assess patient/caregiver ability to obtain necessary supplies Assess patient/caregiver ability to perform ulcer/skin care regimen upon admission and as needed Assess ulceration(s) every visit Provide education on smoking Provide education on ulcer and skin care Treatment Activities: Patient referred to home care : 11/21/2015 KOLIS, Tiny H. ([id number]) Referred to D[facility] [place] for dressing supplies : 11/21/2015 Skin care regimen initiated : 11/21/2015 Topical wound management initiated : 11/21/2015 Notes: Electronic Signature(s) Signed: 11/21/2015 7:01:59 PM By: Montey Hora Entered By: Montey Hora on 11/21/2015 15:50:33 Fabro, Shanine H.  ([id number]) -------------------------------------------------------------------------------- Pain Assessment Details Patient Name: [name], Dalia H. Date of Service: 11/21/2015 2:45 PM Medical Record Number: [id number] Patient Account Number: [id number] Date of Birth/Sex: 21-Dec-1945 (69 y.o. Female) Treating RN: Montey Hora Primary Care Physician: Paulita Cradle Other Clinician: Referring Physician: Paulita Cradle Treating Physician/Extender: Frann Rider in Treatment: 36 Active Problems Location of Pain Severity and Description of Pain Patient Has Paino Yes Site Locations Pain Location: Generalized Pain Duration of the Pain. Constant / Intermittento Constant Pain Management and Medication Current Pain Management: Electronic Signature(s) Signed: 11/21/2015 7:01:59 PM  By: Montey Hora Entered By: Montey Hora on 11/21/2015 15:15:20 Drotar, Stephani Lemmie Evens ([id number]) -------------------------------------------------------------------------------- Patient/Caregiver Education Details Patient Name: [name], Eulene H. Date of Service: 11/21/2015 2:45 PM Medical Record Number: [id number] Patient Account Number: [id number] Date of Birth/Gender: Mar 17, 1946 (70 y.o. Female) Treating RN: Montey Hora Primary Care Physician: Paulita Cradle Other Clinician: Referring Physician: Paulita Cradle Treating Physician/Extender: Frann Rider in Treatment: 91 Education Assessment Education Provided To: Patient and Caregiver Education Topics Provided Wound/Skin Impairment: Handouts: Other: wound care as ordered Methods: Demonstration, Explain/Verbal Responses: State content correctly Electronic Signature(s) Signed: 11/21/2015 6:27:00 PM By: Montey Hora Entered By: Montey Hora on 11/21/2015 18:27:00 Bramlett, Palmina H. ([id number]) -------------------------------------------------------------------------------- Wound Assessment Details Patient Name: Boughner, Beyonca  H. Date of Service: 11/21/2015 2:45 PM Medical Record Number: [id number] Patient Account Number: [id number] Date of Birth/Sex: Mar 14, 1946 (69 y.o. Female) Treating RN: Montey Hora Primary Care Physician: Paulita Cradle Other Clinician: Referring Physician: Paulita Cradle Treating Physician/Extender: Frann Rider in Treatment: 36 Wound Status Wound Number: 10 Primary Pressure Ulcer Etiology: Wound Location: Right Foot - Medial Wound Open Wounding Event: Gradually Appeared Status: Date Acquired: 09/12/2015 Comorbid Cataracts, Asthma, Hypertension, Type Weeks Of Treatment: 9 History: II Diabetes, Neuropathy, Received Clustered Wound: No Chemotherapy Photos Photo Uploaded By: Montey Hora on 11/21/2015 18:31:21 Wound Measurements Length: (cm) 0.3 Width: (cm) 0.3 Depth: (cm) 0.1 Area: (cm) 0.071 Volume: (cm) 0.007 % Reduction in Area: 69.9% % Reduction in Volume: 70.8% Epithelialization: None Tunneling: No Undermining: No Wound Description Classification: Category/Stage II Foul Odor A Diabetic Severity (Wagner): Grade 1 Wound Margin: Flat and Intact Exudate Amount: Medium Exudate Type: Serous Exudate Color: amber fter Cleansing: No Wound Bed Granulation Amount: None Present (0%) Exposed Structure Necrotic Amount: Large (67-100%) Fascia Exposed: No Necrotic Quality: Adherent Slough Fat Layer Exposed: No Mcwherter, Emoree H. ([id number]) Tendon Exposed: No Muscle Exposed: No Joint Exposed: No Bone Exposed: No Limited to Skin Breakdown Periwound Skin Texture Texture Color No Abnormalities Noted: No No Abnormalities Noted: No Callus: No Erythema: Yes Crepitus: No Erythema Location: Circumferential Excoriation: No Temperature / Pain Fluctuance: No Temperature: No Abnormality Friable: No Tenderness on Palpation: Yes Induration: No Localized Edema: No Rash: No Scarring: No Moisture No Abnormalities Noted: No Dry / Scaly: No Moist:  Yes Wound Preparation Ulcer Cleansing: Rinsed/Irrigated with Saline Topical Anesthetic Applied: Other: lidocaine 4%, Treatment Notes Wound #10 (Right, Medial Foot) 1. Cleansed with: Clean wound with Normal Saline 2. Anesthetic Topical Lidocaine 4% cream to wound bed prior to debridement 4. Dressing Applied: Santyl Ointment 5. Secondary Dressing Applied Bordered Foam Dressing Electronic Signature(s) Signed: 11/21/2015 7:01:59 PM By: Montey Hora Entered By: Montey Hora on 11/21/2015 15:48:04 Correnti, Annia H. ([id number]) -------------------------------------------------------------------------------- Wound Assessment Details Patient Name: Twyman, Aliena H. Date of Service: 11/21/2015 2:45 PM Medical Record Number: [id number] Patient Account Number: [id number] Date of Birth/Sex: 1945/11/04 (69 y.o. Female) Treating RN: Montey Hora Primary Care Physician: Paulita Cradle Other Clinician: Referring Physician: Paulita Cradle Treating Physician/Extender: Frann Rider in Treatment: 36 Wound Status Wound Number: 11 Primary Pressure Ulcer Etiology: Wound Location: Right Calcaneus - Distal Wound Open Wounding Event: Pressure Injury Status: Date Acquired: 09/29/2015 Comorbid Cataracts, Asthma, Hypertension, Type Weeks Of Treatment: 7 History: II Diabetes, Neuropathy, Received Clustered Wound: No Chemotherapy Photos Photo Uploaded By: Montey Hora on 11/21/2015 18:31:22 Wound Measurements Length: (cm) 2 Width: (cm) 2 Depth: (cm) 0.2 Area: (cm) 3.142 Volume: (cm) 0.628 % Reduction in Area: -344.4% % Reduction in Volume: -784.5% Epithelialization: None Tunneling: No Undermining: No Wound Description Classification: Category/Stage II  Foul Odor Af Diabetic Severity Earleen Newport): Grade 1 Wound Margin: Flat and Intact Exudate Amount: Large Exudate Type: Serous Exudate Color: amber ter Cleansing: No Wound Bed Granulation Amount: Small (1-33%) Exposed  Structure Granulation Quality: Pink Fascia Exposed: No Necrotic Amount: Large (67-100%) Fat Layer Exposed: No Sydnor, Donya H. ([id number]) Necrotic Quality: Adherent Slough Tendon Exposed: No Muscle Exposed: No Joint Exposed: No Bone Exposed: No Limited to Skin Breakdown Periwound Skin Texture Texture Color No Abnormalities Noted: No No Abnormalities Noted: No Callus: No Atrophie Blanche: No Crepitus: No Cyanosis: No Excoriation: No Ecchymosis: No Fluctuance: No Erythema: No Friable: No Hemosiderin Staining: No Induration: No Mottled: No Localized Edema: No Pallor: No Rash: No Rubor: No Scarring: No Temperature / Pain Moisture Temperature: No Abnormality No Abnormalities Noted: No Tenderness on Palpation: Yes Dry / Scaly: No Maceration: Yes Moist: Yes Wound Preparation Ulcer Cleansing: Rinsed/Irrigated with Saline Topical Anesthetic Applied: Other: lidocaine 4%, Treatment Notes Wound #11 (Right, Distal Calcaneus) 1. Cleansed with: Clean wound with Normal Saline 2. Anesthetic Topical Lidocaine 4% cream to wound bed prior to debridement 4. Dressing Applied: Santyl Ointment 5. Secondary Dressing Applied Bordered Foam Dressing Electronic Signature(s) Signed: 11/21/2015 7:01:59 PM By: Montey Hora Entered By: Montey Hora on 11/21/2015 15:48:28 Medaglia, Rosaleah H. ([id number]) -------------------------------------------------------------------------------- Wound Assessment Details Patient Name: Dahlstrom, Davan H. Date of Service: 11/21/2015 2:45 PM Medical Record Number: [id number] Patient Account Number: [id number] Date of Birth/Sex: Jan 02, 1946 (69 y.o. Female) Treating RN: Montey Hora Primary Care Physician: Paulita Cradle Other Clinician: Referring Physician: Paulita Cradle Treating Physician/Extender: Frann Rider in Treatment: 36 Wound Status Wound Number: 12 Primary Etiology: Pressure Ulcer Wound Location: Right, Proximal  Calcaneus Wound Status: Healed - Epithelialized Wounding Event: Pressure Injury Date Acquired: 11/07/2015 Weeks Of Treatment: 2 Clustered Wound: No Photos Photo Uploaded By: Montey Hora on 11/21/2015 18:31:57 Wound Measurements Length: (cm) 0 % Reduction Width: (cm) 0 % Reduction Depth: (cm) 0 Area: (cm) 0 Volume: (cm) 0 in Area: 100% in Volume: 100% Wound Description Classification: Category/Stage II Periwound Skin Texture Texture Color No Abnormalities Noted: No No Abnormalities Noted: No Moisture No Abnormalities Noted: No Electronic Signature(s) Signed: 11/21/2015 7:01:59 PM By: Penny Pia, Latania H. ([id number]) Entered By: Montey Hora on 11/21/2015 15:32:11 Holness, [name] ([id number]) -------------------------------------------------------------------------------- Wound Assessment Details Patient Name: Thomure, Mattalynn H. Date of Service: 11/21/2015 2:45 PM Medical Record Number: [id number] Patient Account Number: [id number] Date of Birth/Sex: Dec 05, 1945 (69 y.o. Female) Treating RN: Montey Hora Primary Care Physician: Paulita Cradle Other Clinician: Referring Physician: Paulita Cradle Treating Physician/Extender: Frann Rider in Treatment: 36 Wound Status Wound Number: 13 Primary Pressure Ulcer Etiology: Wound Location: Left Calcaneus Wound Open Wounding Event: Pressure Injury Status: Date Acquired: 11/02/2015 Comorbid Cataracts, Asthma, Hypertension, Type Weeks Of Treatment: 2 History: II Diabetes, Neuropathy, Received Clustered Wound: No Chemotherapy Photos Photo Uploaded By: Montey Hora on 11/21/2015 18:31:57 Wound Measurements Length: (cm) 0.4 Width: (cm) 0.4 Depth: (cm) 0.1 Area: (cm) 0.126 Volume: (cm) 0.013 % Reduction in Area: 91.1% % Reduction in Volume: 90.8% Epithelialization: None Tunneling: No Undermining: No Wound Description Classification: Category/Stage II Diabetic Severity Earleen Newport): Grade  1 Wound Margin: Flat and Intact Exudate Amount: Large Exudate Type: Serous Exudate Color: amber Wound Bed Granulation Amount: Medium (34-66%) Exposed Structure Granulation Quality: Red, Pink Fascia Exposed: No Necrotic Amount: Medium (34-66%) Fat Layer Exposed: No Broker, Lexianna H. ([id number]) Necrotic Quality: Adherent Slough Tendon Exposed: No Muscle Exposed: No Joint Exposed: No Bone Exposed: No Limited to Skin Breakdown Periwound Skin Texture Texture Color  No Abnormalities Noted: No No Abnormalities Noted: No Moisture Temperature / Pain No Abnormalities Noted: No Temperature: No Abnormality Maceration: Yes Tenderness on Palpation: Yes Moist: Yes Wound Preparation Ulcer Cleansing: Rinsed/Irrigated with Saline Topical Anesthetic Applied: Other: lidocaine 4%, Treatment Notes Wound #13 (Left Calcaneus) 1. Cleansed with: Clean wound with Normal Saline 2. Anesthetic Topical Lidocaine 4% cream to wound bed prior to debridement 4. Dressing Applied: Santyl Ointment 5. Secondary Dressing Applied Bordered Foam Dressing Electronic Signature(s) Signed: 11/21/2015 7:01:59 PM By: Montey Hora Entered By: Montey Hora on 11/21/2015 15:48:40 Hayduk, Kameka H. ([id number]) -------------------------------------------------------------------------------- Wound Assessment Details Patient Name: Clinard, Artice H. Date of Service: 11/21/2015 2:45 PM Medical Record Number: [id number] Patient Account Number: [id number] Date of Birth/Sex: Jan 12, 1946 (69 y.o. 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 - Anterior Wound Open Wounding Event: Blister Status: Date Acquired: 11/18/2015 Comorbid Cataracts, Asthma, Hypertension, Type Weeks  Of Treatment: 0 History: II Diabetes, Neuropathy, Received Clustered Wound: No Chemotherapy Photos Photo Uploaded By: Montey Hora on 11/21/2015 18:32:40 Wound Measurements Length: (cm) 2.3 Width: (cm) 0.5 Depth: (cm) 0.1 Area: (cm) 0.903 Volume: (cm) 0.09 % Reduction in Area: 0% % Reduction in Volume: 0% Epithelialization: Small (1-33%) Tunneling: No Undermining: No Wound Description Classification: Partial Thickness Diabetic Severity (Wagner): Grade 1 Wound Margin: Flat and Intact Exudate Amount: Large Exudate Type: Serous Exudate Color: amber Wound Bed Granulation Amount: Large (67-100%) Exposed Structure Granulation Quality: Red Fascia Exposed: No Necrotic Amount: None Present (0%) Fat Layer Exposed: No Revell, Myla H. ([id number]) Tendon Exposed: No Muscle Exposed: No Joint Exposed: No Bone Exposed: No Limited to Skin Breakdown Periwound Skin Texture Texture Color No Abnormalities Noted: No No Abnormalities Noted: No Callus: No Atrophie Blanche: No Crepitus: No Cyanosis: No Excoriation: No Ecchymosis: No Fluctuance: No Erythema: No Friable: No Hemosiderin Staining: No Induration: No Mottled: No Localized Edema: No Pallor: No Rash: No Rubor: No Scarring: No Temperature / Pain Moisture Tenderness on Palpation: Yes No Abnormalities Noted: No Dry / Scaly: No Maceration: No Moist: No Wound Preparation Ulcer Cleansing: Rinsed/Irrigated with Saline Topical Anesthetic Applied: None Treatment Notes Wound #14 (Right, Anterior Lower Leg) 1. Cleansed with: Clean wound with Normal Saline 5. Secondary Dressing Applied ABD and Kerlix/Conform 7. Secured with Tape Notes stretch netting Electronic Signature(s) Signed: 11/21/2015 7:01:59 PM By: Montey Hora Entered By: Montey Hora on 11/21/2015 15:48:50 Kemmerling, Henretter H. ([id number]) -------------------------------------------------------------------------------- Wound Assessment  Details Patient Name: Sheldon, Novis H. Date of Service: 11/21/2015 2:45 PM Medical Record Number: [id number] Patient Account Number: [id number] Date of Birth/Sex: 12/18/1945 (69 y.o. Female) Treating RN: Montey Hora Primary Care Physician: Paulita Cradle Other Clinician: Referring Physician: Paulita Cradle Treating Physician/Extender: Frann Rider in Treatment: 36 Wound Status Wound Number: 3 Primary Pressure Ulcer Etiology: Wound Location: Back - Midline Wound Open Wounding Event: Pressure Injury Status: Date Acquired: 05/02/2015 Comorbid Cataracts, Asthma, Hypertension, Type Weeks Of Treatment: 29 History: II Diabetes, Neuropathy, Received Clustered Wound: No Chemotherapy Photos Photo Uploaded By: Montey Hora on 11/21/2015 18:32:41 Wound Measurements Length: (cm) 2.2 Width: (cm) 2.5 Depth: (cm) 0.2 Area: (cm) 4.32 Volume: (cm) 0.864 % Reduction in Area: -1863.6% % Reduction in Volume: -3827.3% Epithelialization: None Tunneling: No Undermining: No Wound Description Classification: Category/Stage II Wound Margin: Distinct, outline attached Exudate Amount: Large Exudate Type: Serosanguineous Exudate Color: red, brown Foul Odor After Cleansing: No  Wound Bed Granulation Amount: Medium (34-66%) Exposed Structure Granulation Quality: Red, Pink Fascia Exposed: No Necrotic Amount: Medium (34-66%) Fat Layer Exposed: No Necrotic Quality: Adherent Slough Tendon Exposed: No Maggard, Nycole H. ([id number]) Muscle Exposed: No Joint Exposed: No Bone Exposed: No Limited to Skin Breakdown Periwound Skin Texture Texture Color No Abnormalities Noted: No No Abnormalities Noted: No Callus: No Atrophie Blanche: No Crepitus: No Cyanosis: No Excoriation: No Ecchymosis: No Fluctuance: No Erythema: No Friable: No Hemosiderin Staining: No Induration: No Mottled: No Localized Edema: No Pallor: No Rash: No Rubor: No Scarring: No Temperature /  Pain Moisture Temperature: No Abnormality No Abnormalities Noted: No Dry / Scaly: No Maceration: No Moist: Yes Wound Preparation Ulcer Cleansing: Rinsed/Irrigated with Saline Topical Anesthetic Applied: Other: lidocaine 4%, Treatment Notes Wound #3 (Midline Back) 1. Cleansed with: Clean wound with Normal Saline 2. Anesthetic Topical Lidocaine 4% cream to wound bed prior to debridement 4. Dressing Applied: Santyl Ointment 5. Secondary Dressing Applied Bordered Foam Dressing Electronic Signature(s) Signed: 11/21/2015 7:01:59 PM By: Montey Hora Entered By: Montey Hora on 11/21/2015 15:49:22 Purk, Katherin H. ([id number]) -------------------------------------------------------------------------------- Wound Assessment Details Patient Name: Rohlfs, Shaundra H. Date of Service: 11/21/2015 2:45 PM Medical Record Number: [id number] Patient Account Number: [id number] Date of Birth/Sex: [birth year] (69 y.o. 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 Wound Open Wounding Event: Pressure Injury Status: Date Acquired: 08/01/2015 Comorbid Cataracts, Asthma, Hypertension, Type Weeks Of Treatment: 15 History: II Diabetes, Neuropathy, Received Clustered Wound: No Chemotherapy Photos Photo Uploaded By: Montey Hora on 11/21/2015 18:33:22 Wound Measurements Length: (cm) 0.4 Width: (cm) 0.7 Depth: (cm) 0.1 Area: (cm) 0.22 Volume: (cm) 0.022 % Reduction in Area: 93% % Reduction in Volume: 93% Epithelialization: None Tunneling: No Undermining: No Wound Description Classification: Category/Stage III Wound Margin: Distinct, outline attached Exudate Amount: Medium Exudate Type: Serous Exudate Color: amber Foul Odor  After Cleansing: No Wound Bed Granulation Amount: None Present (0%) Exposed Structure Necrotic Amount: Large (67-100%) Fascia Exposed: No Necrotic Quality: Eschar Fat Layer Exposed: No Tendon Exposed: No Kiener, Karcyn H. ([id number]) Muscle Exposed: No Joint Exposed: No Bone Exposed: No Limited to Skin Breakdown Periwound Skin Texture Texture Color No Abnormalities Noted: No No Abnormalities Noted: No Callus: No Atrophie Blanche: No Crepitus: No Cyanosis: No Excoriation: No Ecchymosis: No Fluctuance: No Erythema: No Friable: No Hemosiderin Staining: No Induration: No Mottled: No Localized Edema: No Pallor: No Rash: No Rubor: No Scarring: No Temperature / Pain Moisture Temperature: No Abnormality No Abnormalities Noted: No Tenderness on Palpation: Yes Dry / Scaly: No Maceration: No Moist: Yes Wound Preparation Ulcer Cleansing: Rinsed/Irrigated with Saline Topical Anesthetic Applied: Other: lidocaine 4%, Treatment Notes Wound #7 (Right Ischial Tuberosity) 1. Cleansed with: Clean wound with Normal Saline 2. Anesthetic Topical Lidocaine 4% cream to wound bed prior to debridement 4. Dressing Applied: Santyl Ointment 5. Secondary Dressing Applied Bordered Foam Dressing Electronic Signature(s) Signed: 11/21/2015 7:01:59 PM By: Montey Hora Entered By: Montey Hora on 11/21/2015 15:49:51 Eve, Dejuana H. ([id number]) -------------------------------------------------------------------------------- Wound Assessment Details Patient Name: Mukai, Sherrol H. Date of Service: 11/21/2015 2:45 PM Medical Record Number: [id number] Patient Account Number: [id number] Date of Birth/Sex: 11-27-1945 (69 y.o. Female) Treating RN: Montey Hora Primary Care Physician: Paulita Cradle Other Clinician: Referring Physician: Paulita Cradle Treating Physician/Extender: Frann Rider in Treatment: 36 Wound Status Wound Number: 8 Primary Pressure Ulcer Etiology:  Wound  Location: Left Malleolus Wound Open Wounding Event: Gradually Appeared Status: Date Acquired: 09/12/2015 Comorbid Cataracts, Asthma, Hypertension, Type Weeks Of Treatment: 9 History: II Diabetes, Neuropathy, Received Clustered Wound: No Chemotherapy Photos Photo Uploaded By: Montey Hora on 11/21/2015 18:33:23 Wound Measurements Length: (cm) 1.2 Width: (cm) 0.9 Depth: (cm) 0.3 Area: (cm) 0.848 Volume: (cm) 0.254 % Reduction in Area: -1704.3% % Reduction in Volume: -2722.2% Epithelialization: None Tunneling: No Undermining: No Wound Description Classification: Category/Stage II Foul Odor A Diabetic Severity (Wagner): Grade 0 Wound Margin: Flat and Intact Exudate Amount: Medium Exudate Type: Serous Exudate Color: amber fter Cleansing: No Wound Bed Granulation Amount: Medium (34-66%) Exposed Structure Necrotic Amount: Medium (34-66%) Fascia Exposed: No Necrotic Quality: Adherent Slough Fat Layer Exposed: No Montilla, Telly H. ([id number]) Tendon Exposed: No Muscle Exposed: No Joint Exposed: No Bone Exposed: No Limited to Skin Breakdown Periwound Skin Texture Texture Color No Abnormalities Noted: No No Abnormalities Noted: No Callus: No Atrophie Blanche: No Crepitus: No Cyanosis: No Excoriation: No Ecchymosis: No Fluctuance: No Erythema: Yes Friable: No Erythema Location: Circumferential Induration: No Hemosiderin Staining: No Localized Edema: No Mottled: No Rash: No Pallor: No Scarring: No Rubor: No Moisture Temperature / Pain No Abnormalities Noted: No Temperature: No Abnormality Dry / Scaly: No Tenderness on Palpation: Yes Maceration: No Moist: Yes Wound Preparation Ulcer Cleansing: Rinsed/Irrigated with Saline Topical Anesthetic Applied: Other: Lidocaine 4%, Treatment Notes Wound #8 (Left Malleolus) 1. Cleansed with: Clean wound with Normal Saline 2. Anesthetic Topical Lidocaine 4% cream to wound bed prior to debridement 4. Dressing  Applied: Santyl Ointment 5. Secondary Dressing Applied Bordered Foam Dressing Electronic Signature(s) Signed: 11/21/2015 7:01:59 PM By: Montey Hora Entered By: Montey Hora on 11/21/2015 15:50:07 Rundquist, Preslei H. ([id number]) -------------------------------------------------------------------------------- Wound Assessment Details Patient Name: Plante, Deyjah H. Date of Service: 11/21/2015 2:45 PM Medical Record Number: [id number] Patient Account Number: [id number] Date of Birth/Sex: 01/03/1946 (69 y.o. Female) Treating RN: Montey Hora Primary Care Physician: Paulita Cradle Other Clinician: Referring Physician: Paulita Cradle Treating Physician/Extender: Frann Rider in Treatment: 36 Wound Status Wound Number: 9 Primary Pressure Ulcer Etiology: Wound Location: Left Ischial Tuberosity Wound Open Wounding Event: Gradually Appeared Status: Date Acquired: 09/12/2015 Comorbid Cataracts, Asthma, Hypertension, Type Weeks Of Treatment: 9 History: II Diabetes, Neuropathy, Received Clustered Wound: No Chemotherapy Photos Photo Uploaded By: Montey Hora on 11/21/2015 18:33:39 Wound Measurements Length: (cm) 0.5 Width: (cm) 0.6 Depth: (cm) 0.1 Area: (cm) 0.236 Volume: (cm) 0.024 % Reduction in Area: -20.4% % Reduction in Volume: -20% Epithelialization: None Tunneling: No Undermining: No Wound Description Classification: Category/Stage II Wound Margin: Flat and Intact Exudate Amount: Medium Exudate Type: Serous Exudate Color: amber Foul Odor After Cleansing: No Wound Bed Granulation Amount: None Present (0%) Exposed Structure Necrotic Amount: Large (67-100%) Fascia Exposed: No Necrotic Quality: Adherent Slough Fat Layer Exposed: No Tendon Exposed: No Guardia, Tanner H. ([id number]) Muscle Exposed: No Joint Exposed: No Bone Exposed: No Limited to Skin Breakdown Periwound Skin Texture Texture Color No Abnormalities Noted: No No Abnormalities Noted:  No Callus: No Atrophie Blanche: No Crepitus: No Cyanosis: No Excoriation: No Ecchymosis: No Fluctuance: No Erythema: No Friable: No Hemosiderin Staining: No Induration: No Mottled: No Localized Edema: No Pallor: No Rash: No Rubor: No Scarring: No Temperature / Pain Moisture Temperature: No Abnormality No Abnormalities Noted: No Dry / Scaly: No Maceration: No Moist: Yes Wound Preparation Ulcer Cleansing: Rinsed/Irrigated with Saline Topical Anesthetic Applied: Other: lidocaine 4%, Treatment Notes Wound #9 (Left Ischial Tuberosity) 1. Cleansed with: Clean wound with Normal Saline 2.  Anesthetic Topical Lidocaine 4% cream to wound bed prior to debridement 4. Dressing Applied: Aquacel Ag 5. Secondary Dressing Applied Bordered Foam Dressing Electronic Signature(s) Signed: 11/21/2015 7:01:59 PM By: Montey Hora Entered By: Montey Hora on 11/21/2015 15:50:20 Thurmond, Caleen H. ([id number]) -------------------------------------------------------------------------------- Vitals Details Patient Name: [name], Halle H. Date of Service: 11/21/2015 2:45 PM Medical Record Number: [id number] Patient Account Number: [id number] Date of Birth/Sex: 04-21-1946 (69 y.o. Female) Treating RN: Montey Hora Primary Care Physician: Paulita Cradle Other Clinician: Referring Physician: Paulita Cradle Treating Physician/Extender: Frann Rider in Treatment: 36 Vital Signs Time Taken: 15:15 Temperature (F): 98.7 Height (in): 65 Pulse (bpm): 113 Weight (lbs): 100 Respiratory Rate (breaths/min): 20 Body Mass Index (BMI): 16.6 Blood Pressure (mmHg): 136/63 Reference Range: 80 - 120 mg / dl Electronic Signature(s) Signed: 11/21/2015 7:01:59 PM By: Montey Hora Entered By: Montey Hora on 11/21/2015 15:18:03

## 2015-11-28 ENCOUNTER — Encounter: Payer: Medicare Other | Admitting: Surgery

## 2015-11-28 DIAGNOSIS — E11621 Type 2 diabetes mellitus with foot ulcer: Secondary | ICD-10-CM | POA: Diagnosis not present

## 2015-11-29 NOTE — Progress Notes (Addendum)
ALDERFER, Domenique H. ([id number]) Visit Report for 11/28/2015 Arrival Information Details Patient Name: [name], [name] Date of Service: 11/28/2015 3:30 PM Medical Record Number: [id number] Patient Account Number: [id number] Date of Birth/Sex: 05-Jul-1946 (70 y.o. Female) Treating RN: Montey Hora Primary Care Physician: Paulita Cradle Other Clinician: Referring Physician: Paulita Cradle Treating Physician/Extender: Frann Rider in Treatment: 33 Visit Information History Since Last Visit Added or deleted any medications: No Patient Arrived: Walker Any new allergies or adverse reactions: No Arrival Time: 15:38 Had a fall or experienced change in No Accompanied By: spouse activities of daily living that may affect Transfer Assistance: Manual risk of falls: Patient Identification Verified: Yes Signs or symptoms of abuse/neglect since last No Secondary Verification Process Completed: Yes visito Patient Requires Transmission-Based No Hospitalized since last visit: No Precautions: Pain Present Now: No Patient Has Alerts: No Electronic Signature(s) Signed: 12/01/2015 5:01:01 PM By: Montey Hora Entered By: Montey Hora on 11/28/2015 15:45:13 [name], [name] ([id number]) -------------------------------------------------------------------------------- Clinic Level of Care Assessment Details Patient Name: [name], [name] Date of Service: 11/28/2015 3:30 PM Medical Record Number: [id number] Patient Account Number: [id number] Date of Birth/Sex: Jun 19, 1946 (70 y.o. Female) Treating RN: Montey Hora Primary Care Physician: Paulita Cradle Other Clinician: Referring Physician: Paulita Cradle Treating Physician/Extender: Frann Rider in Treatment: 41 Clinic Level of Care Assessment Items TOOL 4 Quantity Score []  - Use when only an EandM is performed on FOLLOW-UP visit 0 ASSESSMENTS - Nursing Assessment / Reassessment X - Reassessment of Co-morbidities  (includes updates in patient status) 1 10 X - Reassessment of Adherence to Treatment Plan 1 5 ASSESSMENTS - Wound and Skin Assessment / Reassessment []  - Simple Wound Assessment / Reassessment - one wound 0 X - Complex Wound Assessment / Reassessment - multiple wounds 7 5 []  - Dermatologic / Skin Assessment (not related to wound area) 0 ASSESSMENTS - Focused Assessment []  - Circumferential Edema Measurements - multi extremities 0 []  - Nutritional Assessment / Counseling / Intervention 0 []  - Lower Extremity Assessment (monofilament, tuning fork, pulses) 0 []  - Peripheral Arterial Disease Assessment (using hand held doppler) 0 ASSESSMENTS - Ostomy and/or Continence Assessment and Care []  - Incontinence Assessment and Management 0 []  - Ostomy Care Assessment and Management (repouching, etc.) 0 PROCESS - Coordination of Care X - Simple Patient / Family Education for ongoing care 1 15 []  - Complex (extensive) Patient / Family Education for ongoing care 0 []  - Staff obtains Programmer, systems, Records, Test Results / Process Orders 0 []  - Staff telephones HHA, Nursing Homes / Clarify orders / etc 0 []  - Routine Transfer to another Facility (non-emergent condition) 0 Pons, Kenadi H. ([id number]) []  - Routine Hospital Admission (non-emergent condition) 0 []  - New Admissions / Biomedical engineer / Ordering NPWT, Apligraf, etc. 0 []  - Emergency Hospital Admission (emergent condition) 0 X - Simple Discharge Coordination 1 10 []  - Complex (extensive) Discharge Coordination 0 PROCESS - Special Needs []  - Pediatric / Minor Patient Management 0 []  - Isolation Patient Management 0 []  - Hearing / Language / Visual special needs 0 []  - Assessment of Community assistance (transportation, D/C planning, etc.) 0 []  - Additional assistance / Altered mentation 0 []  - Support Surface(s) Assessment (bed, cushion, seat, etc.) 0 INTERVENTIONS - Wound Cleansing / Measurement []  - Simple Wound Cleansing - one wound  0 X - Complex Wound Cleansing - multiple wounds 7 5 X - Wound Imaging (photographs - any number of wounds) 1 5 []  - Wound Tracing (instead of photographs) 0 []  -  Simple Wound Measurement - one wound 0 X - Complex Wound Measurement - multiple wounds 7 5 INTERVENTIONS - Wound Dressings X - Small Wound Dressing one or multiple wounds 7 10 []  - Medium Wound Dressing one or multiple wounds 0 []  - Large Wound Dressing one or multiple wounds 0 []  - Application of Medications - topical 0 []  - Application of Medications - injection 0 INTERVENTIONS - Miscellaneous []  - External ear exam 0 Telford, Meggan H. ([id number]) []  - Specimen Collection (cultures, biopsies, blood, body fluids, etc.) 0 []  - Specimen(s) / Culture(s) sent or taken to Lab for analysis 0 []  - Patient Transfer (multiple staff / [name] / Similar devices) 0 []  - Simple Staple / Suture removal (25 or less) 0 []  - Complex Staple / Suture removal (26 or more) 0 []  - Hypo / Hyperglycemic Management (close monitor of Blood Glucose) 0 []  - Ankle / Brachial Index (ABI) - do not check if billed separately 0 X - Vital Signs 1 5 Has the patient been seen at the hospital within the last three years: Yes Total Score: 225 Level Of Care: New/Established - Level 5 Electronic Signature(s) Signed: 11/28/2015 4:46:25 PM By: Montey Hora Entered By: Montey Hora on 11/28/2015 16:46:25 Loux, Elanore H. ([id number]) -------------------------------------------------------------------------------- Encounter Discharge Information Details Patient Name: Mckenzie Fusi, [name] Date of Service: 11/28/2015 3:30 PM Medical Record Number: [id number] Patient Account Number: [id number] Date of Birth/Sex: 1945-12-09 (70 y.o. Female) Treating RN: Montey Hora Primary Care Physician: Paulita Cradle Other Clinician: Referring Physician: Paulita Cradle Treating Physician/Extender: Frann Rider in Treatment: 37 Encounter Discharge Information  Items Discharge Pain Level: 0 Discharge Condition: Stable Ambulatory Status: Walker Discharge Destination: Home Transportation: Private Auto Accompanied By: spouse Schedule Follow-up Appointment: Yes Medication Reconciliation completed No and provided to Patient/Care Maylyn Narvaiz: Provided on Clinical Summary of Care: 11/28/2015 Form Type Recipient Paper Patient AS Electronic Signature(s) Signed: 11/28/2015 4:44:39 PM By: Montey Hora Previous Signature: 11/28/2015 4:33:53 PM Version By: Sharon Mt Entered By: Montey Hora on 11/28/2015 16:44:39 Wadhwa, Floye H. ([id number]) -------------------------------------------------------------------------------- Multi Wound Chart Details Patient Name: Mckenzie Fusi, Lorenia H. Date of Service: 11/28/2015 3:30 PM Medical Record Number: [id number] Patient Account Number: [id number] Date of Birth/Sex: 23-Mar-1946 (70 y.o. 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] [11:Pressure Ulcer] [13:Pressure Ulcer] Comorbid History: [10:Cataracts, Asthma, Hypertension, Type II Diabetes, Neuropathy, Received Chemotherapy] [11:Cataracts, Asthma, Hypertension, Type II Diabetes, Neuropathy, Received Chemotherapy] [13:Cataracts, Asthma, Hypertension, Type II Diabetes,  Neuropathy, Received Chemotherapy] Date Acquired: [10:09/12/2015] [11:09/29/2015] [13:11/02/2015] Weeks of  Treatment: [10:10] [11:8] [13:3] Wound Status: [10:Open] [11:Open] [13:Open] Measurements L x W x D 0.3x0.4x0.1 [11:1.8x1.8x0.2] [13:0.4x0.4x0.1] (cm) Area (cm) : [10:0.094] [11:2.545] [13:0.126] Volume (cm) : [10:0.009] [11:0.509] [13:0.013] % Reduction in Area: [10:60.20%] [11:-260.00%] [13:91.10%] % Reduction in Volume: 62.50% [11:-616.90%] [13:90.80%] Classification: [10:Category/Stage II] [11:Category/Stage II] [13:Category/Stage II] HBO Classification: [10:Grade 1] [11:Grade 1] [13:Grade 1] Exudate Amount: [10:Medium] [11:Large] [13:Large] Exudate Type: [10:Serous] [11:Serous] [13:Serous] Exudate Color: [10:amber] [11:amber] [13:amber] Wound Margin: [10:Flat and Intact] [11:Flat and Intact] [13:Flat and Intact] Granulation Amount: [10:None Present (0%)] [11:Small (1-33%)] [13:Medium (34-66%)] Granulation Quality: [10:N/A] [11:Pink] [13:Red, Pink]  Necrotic Amount: [10:Large (67-100%)] [11:Large (67-100%)] [13:Medium (34-66%)] Necrotic Tissue: [10:Adherent Slough] [11:Adherent Slough] [13:Adherent Slough] Exposed Structures: [10:Fascia: No Fat: No Tendon: No] [11:Fascia: No Fat: No Tendon: No] [13:Fascia: No Fat: No Tendon: No] Muscle: No Muscle: No Muscle: No Joint: No Joint: No Joint: No Bone: No Bone: No Bone: No Limited to Skin Limited to Skin Limited to Skin Breakdown Breakdown Breakdown Epithelialization: None None None Periwound Skin Texture: Edema: No Edema: No No Abnormalities Noted Excoriation: No Excoriation: No Induration: No Induration: No Callus: No Callus: No Crepitus: No Crepitus: No Fluctuance: No Fluctuance: No Friable: No Friable: No Rash: No Rash: No Scarring: No Scarring: No Periwound Skin Moist: Yes Maceration: Yes Maceration: Yes Moisture: Dry/Scaly: No Moist: Yes Moist: Yes Dry/Scaly: No Periwound Skin Color: Erythema: Yes Atrophie Blanche: No No Abnormalities Noted Cyanosis: No Ecchymosis: No Erythema: No Hemosiderin  Staining: No Mottled: No Pallor: No Rubor: No Erythema Location: Circumferential N/A N/A Temperature: No Abnormality No Abnormality No Abnormality Tenderness on Yes Yes Yes Palpation: Wound Preparation: Ulcer Cleansing: Ulcer Cleansing: Ulcer Cleansing: Rinsed/Irrigated with Rinsed/Irrigated with Rinsed/Irrigated with Saline Saline Saline Topical Anesthetic Topical Anesthetic Topical Anesthetic Applied: Other: lidocaine Applied: Other: lidocaine Applied: Other: lidocaine 4% 4% 4% Wound Number: [id number]  Photos: No Photos No Photos No Photos Wound Location: Right, Anterior Lower Leg Back - Midline Right Ischial Tuberosity Wounding Event: Blister Pressure Injury Pressure Injury 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 ([id number]) Measurements L x W x D 0x0x0 2x2x0.2 1.1x0.5x0.2 (cm) Area (cm) : 0 3.142 0.432 Volume (cm) : 0 0.628 0.086 % Reduction in Area: 100.00% -1328.20% 86.30% % Reduction in Volume: 100.00% -2754.50% 72.60% Classification: Partial Thickness Category/Stage II Category/Stage III HBO Classification: N/A N/A N/A Exudate Amount: N/A Large Medium Exudate Type: N/A Serosanguineous Serous Exudate Color: N/A red, brown amber Wound Margin: N/A Distinct, outline attached Distinct, outline attached Granulation Amount: N/A Medium (34-66%) None Present (0%) Granulation Quality: N/A Red, Pink N/A Necrotic Amount: N/A Medium (34-66%) Large (67-100%) Necrotic Tissue: N/A Adherent Slough Eschar Exposed Structures: N/A Fascia: No Fascia: No Fat: No Fat: No Tendon: No Tendon: No Muscle: No Muscle: No Joint:  No Joint: No Bone: No Bone: No Limited to Skin Limited to Skin Breakdown Breakdown Epithelialization: N/A None None Periwound Skin Texture: No Abnormalities Noted Edema: No Edema: No Excoriation: No Excoriation: No Induration: No Induration: No Callus: No Callus: No Crepitus: No Crepitus: No Fluctuance: No Fluctuance: No Friable: No Friable: No Rash: No Rash: No Scarring: No Scarring: No Periwound Skin No Abnormalities Noted Moist: Yes Moist: Yes Moisture: Maceration: No Maceration: No Dry/Scaly: No Dry/Scaly: No Periwound Skin Color: No Abnormalities Noted Atrophie Blanche: No Atrophie Blanche: No Cyanosis: No Cyanosis: No Ecchymosis: No Ecchymosis: No Erythema: No Erythema: No Hemosiderin Staining: No Hemosiderin Staining: No Mottled: No Mottled: No Pallor: No Pallor: No Rubor: No Rubor: No Erythema Location: N/A N/A N/A Temperature: N/A No Abnormality No Abnormality Tenderness on No No Yes Palpation: Wound Preparation: N/A Crisafulli, Talyia H. ([id number]) Ulcer Cleansing: Ulcer Cleansing: Rinsed/Irrigated with Rinsed/Irrigated with Saline Saline Topical Anesthetic Topical Anesthetic Applied: Other: lidocaine Applied: Other: lidocaine 4% 4% Wound Number: [id number] N/A Photos: No Photos No Photos N/A Wound Location: Left Malleolus Left Ischial Tuberosity N/A Wounding  Event: Gradually Appeared Gradually Appeared N/A Primary Etiology: Pressure Ulcer Pressure Ulcer N/A Comorbid History: Cataracts, Asthma, Cataracts, Asthma, N/A Hypertension, Type II Hypertension, Type II Diabetes, Neuropathy, Diabetes, Neuropathy, Received Chemotherapy Received Chemotherapy Date Acquired: 09/12/2015 09/12/2015 N/A Weeks of Treatment: 10 10 N/A Wound Status: Open Open N/A Measurements L x W x D 1.1x1.1x0.2 0.2x0.2x0.1 N/A (cm) Area (cm) : 0.95 0.031 N/A Volume (cm) : 0.19 0.003 N/A % Reduction in Area: -1921.30% 84.20% N/A % Reduction in Volume: -2011.10% 85.00%  N/A Classification: Category/Stage II Category/Stage II N/A HBO Classification: Grade 0 N/A N/A Exudate Amount: Medium Medium N/A Exudate Type: Serous Serous N/A Exudate Color: amber amber N/A Wound Margin: Flat and Intact Flat and Intact N/A Granulation Amount: Medium (34-66%) None Present (0%) N/A Granulation Quality: N/A N/A N/A Necrotic Amount: Medium (34-66%) Large (67-100%) N/A Necrotic Tissue: Adherent Slough Adherent Slough N/A Exposed Structures: Fascia: No Fascia: No N/A Fat: No Fat: No Tendon: No Tendon: No Muscle: No Muscle: No Joint: No Joint: No Bone: No Bone: No Limited to Skin Limited to Skin Breakdown Breakdown Epithelialization: None None N/A Periwound Skin Texture: Edema: No Edema: No N/A Excoriation: No Excoriation: No Induration: No Induration: No Callus: No Callus: No Coluccio, Latondra H. ([id number]) Crepitus: No Crepitus: No Fluctuance: No Fluctuance: No Friable: No Friable: No Rash: No Rash: No Scarring: No Scarring: No Periwound Skin Moist: Yes Moist: Yes N/A Moisture: Maceration: No Maceration: No Dry/Scaly: No Dry/Scaly: No Periwound Skin Color: Erythema: Yes Atrophie Blanche: No N/A Atrophie Blanche: No Cyanosis: No Cyanosis: No Ecchymosis: No Ecchymosis: No Erythema: No Hemosiderin Staining: No Hemosiderin Staining: No Mottled: No Mottled: No Pallor: No Pallor: No Rubor: No Rubor: No Erythema Location: Circumferential N/A N/A Temperature: No Abnormality No Abnormality N/A Tenderness on Yes No N/A Palpation: Wound Preparation: Ulcer Cleansing: Ulcer Cleansing: N/A Rinsed/Irrigated with Rinsed/Irrigated with Saline Saline Topical Anesthetic Topical Anesthetic Applied: Other: Lidocaine Applied: Other: lidocaine 4% 4% Treatment Notes Electronic Signature(s) Signed: 11/28/2015 4:39:49 PM By: Montey Hora Entered By: Montey Hora on 11/28/2015 16:39:49 Waldridge, Torin H.  ([id number]) -------------------------------------------------------------------------------- Multi-Disciplinary Care Plan Details Patient Name: Mckenzie Fusi, Kelin H. Date of Service: 11/28/2015 3:30 PM Medical Record Number: [id number] Patient Account Number: [id number] Date of Birth/Sex: 12-09-1945 (70 y.o. Female) Treating RN: Montey Hora Primary Care Physician: Paulita Cradle Other Clinician: Referring Physician: Paulita Cradle Treating Physician/Extender: Frann Rider in Treatment: 66 Active Inactive Abuse / Safety / Falls / Self Care Management Nursing Diagnoses: Impaired home maintenance Impaired physical mobility Knowledge deficit related to: safety; personal, health (wound), emergency Potential for falls Self care deficit: actual or potential Goals: Patient will remain injury free Date Initiated: 03/13/2015 Goal Status: Active Patient/caregiver will verbalize understanding of skin care regimen Date Initiated: 03/13/2015 Goal Status: Active Patient/caregiver will verbalize/demonstrate measure taken to improve self care Date Initiated: 03/13/2015 Goal Status: Active Patient/caregiver will verbalize/demonstrate measures taken to improve the patient's personal safety Date Initiated: 03/13/2015 Goal Status: Active Patient/caregiver will verbalize/demonstrate measures taken to prevent injury and/or falls Date Initiated: 03/13/2015 Goal Status: Active Patient/caregiver will verbalize/demonstrate understanding of what to do in case of emergency Date Initiated: 03/13/2015 Goal Status: Active Interventions: Assess fall risk on admission and as needed Assess: immobility, friction, shearing, incontinence upon admission and as needed Assess impairment of mobility on admission and as needed per policy Assess self care needs on admission and as needed Provide education on basic hygiene Daddario, Unika H. ([id number]) Provide education on fall prevention Provide education on  personal and home safety Provide education  on safe transfers Treatment Activities: Education provided on Basic Hygiene : 03/13/2015 Notes: Orientation to the Wound Care Program Nursing Diagnoses: Knowledge deficit related to the wound healing center program Goals: Patient/caregiver will verbalize understanding of the [place] Date Initiated: 03/13/2015 Goal Status: Active Interventions: Provide education on orientation to the wound center Notes: Pressure Nursing Diagnoses: Knowledge deficit related to causes and risk factors for pressure ulcer development Knowledge deficit related to management of pressures ulcers Potential for impaired tissue integrity related to pressure, friction, moisture, and shear Goals: Patient will remain free from development of additional pressure ulcers Date Initiated: 03/13/2015 Goal Status: Active Patient will remain free of pressure ulcers Date Initiated: 03/13/2015 Goal Status: Active Patient/caregiver will verbalize risk factors for pressure ulcer development Date Initiated: 03/13/2015 Goal Status: Active Patient/caregiver will verbalize understanding of pressure ulcer management Date Initiated: 03/13/2015 Goal Status: Active Interventions: Assess: immobility, friction, shearing, incontinence upon admission and as needed Norrod, Islah H. ([id number]) Assess offloading mechanisms upon admission and as needed Assess potential for pressure ulcer upon admission and as needed Provide education on pressure ulcers Treatment Activities: Patient referred for home evaluation of offloading devices/mattresses : 11/28/2015 Patient referred for pressure reduction/relief devices : 11/28/2015 Patient referred for seating evaluation to ensure proper offloading : 11/28/2015 Pressure reduction/relief device ordered : 11/28/2015 Test ordered outside of clinic : 11/28/2015 Notes: Wound/Skin Impairment Nursing Diagnoses: Impaired tissue  integrity Knowledge deficit related to ulceration/compromised skin integrity Goals: Patient/caregiver will verbalize understanding of skin care regimen Date Initiated: 03/13/2015 Goal Status: Active Ulcer/skin breakdown will have a volume reduction of 30% by week 4 Date Initiated: 03/13/2015 Goal Status: Active Ulcer/skin breakdown will have a volume reduction of 50% by week 8 Date Initiated: 03/13/2015 Goal Status: Active Ulcer/skin breakdown will have a volume reduction of 80% by week 12 Date Initiated: 03/13/2015 Goal Status: Active Ulcer/skin breakdown will heal within 14 weeks Date Initiated: 03/13/2015 Goal Status: Active Interventions: Assess patient/caregiver ability to obtain necessary supplies Assess patient/caregiver ability to perform ulcer/skin care regimen upon admission and as needed Assess ulceration(s) every visit Provide education on smoking Provide education on ulcer and skin care Treatment Activities: Patient referred to home care : 11/28/2015 CALIA, Ayrianna H. ([id number]) Referred to DME Timberlynn Kizziah for dressing supplies : 11/28/2015 Skin care regimen initiated : 11/28/2015 Topical wound management initiated : 11/28/2015 Notes: Electronic Signature(s) Signed: 11/28/2015 4:39:42 PM By: Montey Hora Entered By: Montey Hora on 11/28/2015 16:39:42 Dorton, Khushboo H. ([id number]) -------------------------------------------------------------------------------- Patient/Caregiver Education Details Patient Name: Mckenzie Fusi, Milda H. Date of Service: 11/28/2015 3:30 PM Medical Record Number: [id number] Patient Account Number: [id number] Date of Birth/Gender: September 21, 1945 (70 y.o. Female) Treating RN: Montey Hora Primary Care Physician: Paulita Cradle Other Clinician: Referring Physician: Paulita Cradle Treating Physician/Extender: Frann Rider in Treatment: 75 Education Assessment Education Provided To: Patient and Caregiver Education Topics Provided Wound/Skin  Impairment: Handouts: Other: wound care as ordered Methods: Demonstration, Explain/Verbal, Printed Responses: State content correctly Electronic Signature(s) Signed: 11/28/2015 4:45:04 PM By: Montey Hora Previous Signature: 11/28/2015 4:44:57 PM Version By: Montey Hora Entered By: Montey Hora on 11/28/2015 16:45:04 Mancera, Malayshia H. ([id number]) -------------------------------------------------------------------------------- Wound Assessment Details Patient Name: Frech, Jaunita H. Date of Service: 11/28/2015 3:30 PM Medical Record Number: [id number] Patient Account Number: [id number] Date of Birth/Sex: Jan 19, 1946 (70 y.o. Female) Treating RN: Montey Hora Primary Care Physician: Paulita Cradle Other Clinician: Referring Physician: Paulita Cradle Treating Physician/Extender: Frann Rider in Treatment: 37 Wound Status Wound Number: 10 Primary Pressure Ulcer Etiology: Wound Location: Right Foot - Medial  Wound Open Wounding Event: Gradually Appeared Status: Date Acquired: 09/12/2015 Comorbid Cataracts, Asthma, Hypertension, Type Weeks Of Treatment: 10 History: II Diabetes, Neuropathy, Received Clustered Wound: No Chemotherapy Photos Photo Uploaded By: Montey Hora on 11/28/2015 16:55:25 Wound Measurements Length: (cm) 0.3 Width: (cm) 0.4 Depth: (cm) 0.1 Area: (cm) 0.094 Volume: (cm) 0.009 % Reduction in Area: 60.2% % Reduction in Volume: 62.5% Epithelialization: None Tunneling: No Undermining: No Wound Description Classification: Category/Stage II Foul Odor A Diabetic Severity (Wagner): Grade 1 Wound Margin: Flat and Intact Exudate Amount: Medium Exudate Type: Serous Exudate Color: amber fter Cleansing: No Wound Bed Granulation Amount: None Present (0%) Exposed Structure Necrotic Amount: Large (67-100%) Fascia Exposed: No Necrotic Quality: Adherent Slough Fat Layer Exposed: No Sleeper, Jelene H. ([id number]) Tendon Exposed: No Muscle  Exposed: No Joint Exposed: No Bone Exposed: No Limited to Skin Breakdown Periwound Skin Texture Texture Color No Abnormalities Noted: No No Abnormalities Noted: No Callus: No Erythema: Yes Crepitus: No Erythema Location: Circumferential Excoriation: No Temperature / Pain Fluctuance: No Temperature: No Abnormality Friable: No Tenderness on Palpation: Yes Induration: No Localized Edema: No Rash: No Scarring: No Moisture No Abnormalities Noted: No Dry / Scaly: No Moist: Yes Wound Preparation Ulcer Cleansing: Rinsed/Irrigated with Saline Topical Anesthetic Applied: Other: lidocaine 4%, Treatment Notes Wound #10 (Right, Medial Foot) 1. Cleansed with: Clean wound with Normal Saline 2. Anesthetic Topical Lidocaine 4% cream to wound bed prior to debridement 4. Dressing Applied: Aquacel Ag 5. Secondary Dressing Applied Bordered Foam Dressing Electronic Signature(s) Signed: 11/28/2015 4:38:22 PM By: Montey Hora Entered By: Montey Hora on 11/28/2015 16:38:21 Patchen, Jarika H. ([id number]) -------------------------------------------------------------------------------- Wound Assessment Details Patient Name: Migues, Donnalee H. Date of Service: 11/28/2015 3:30 PM Medical Record Number: [id number] Patient Account Number: [id number] Date of Birth/Sex: 11-Jan-1946 (70 y.o. Female) Treating RN: Montey Hora Primary Care Physician: Paulita Cradle Other Clinician: Referring Physician: Paulita Cradle Treating Physician/Extender: Frann Rider in Treatment: 37 Wound Status Wound Number: 11 Primary Pressure Ulcer Etiology: Wound Location: Right Calcaneus - Distal Wound Open Wounding Event: Pressure Injury Status: Date Acquired: 09/29/2015 Comorbid Cataracts, Asthma, Hypertension, Type Weeks Of Treatment: 8 History: II Diabetes, Neuropathy, Received Clustered Wound: No Chemotherapy Photos Photo Uploaded By: Montey Hora on 11/28/2015 16:55:25 Wound  Measurements Length: (cm) 1.8 Width: (cm) 1.8 Depth: (cm) 0.2 Area: (cm) 2.545 Volume: (cm) 0.509 % Reduction in Area: -260% % Reduction in Volume: -616.9% Epithelialization: None Tunneling: No Undermining: No Wound Description Classification: Category/Stage II Foul Odor Af Diabetic Severity (Wagner): Grade 1 Wound Margin: Flat and Intact Exudate Amount: Large Exudate Type: Serous Exudate Color: amber ter Cleansing: No Wound Bed Granulation Amount: Small (1-33%) Exposed Structure Granulation Quality: Pink Fascia Exposed: No Necrotic Amount: Large (67-100%) Fat Layer Exposed: No Swicegood, Majesta H. ([id number]) Necrotic Quality: Adherent Slough Tendon Exposed: No Muscle Exposed: No Joint Exposed: No Bone Exposed: No Limited to Skin Breakdown Periwound Skin Texture Texture Color No Abnormalities Noted: No No Abnormalities Noted: No Callus: No Atrophie Blanche: No Crepitus: No Cyanosis: No Excoriation: No Ecchymosis: No Fluctuance: No Erythema: No Friable: No Hemosiderin Staining: No Induration: No Mottled: No Localized Edema: No Pallor: No Rash: No Rubor: No Scarring: No Temperature / Pain Moisture Temperature: No Abnormality No Abnormalities Noted: No Tenderness on Palpation: Yes Dry / Scaly: No Maceration: Yes Moist: Yes Wound Preparation Ulcer Cleansing: Rinsed/Irrigated with Saline Topical Anesthetic Applied: Other: lidocaine 4%, Treatment Notes Wound #11 (Right, Distal Calcaneus) 1. Cleansed with: Clean wound with Normal Saline 2. Anesthetic Topical Lidocaine 4% cream to wound bed  prior to debridement 4. Dressing Applied: Santyl Ointment 5. Secondary Dressing Applied Bordered Foam Dressing Electronic Signature(s) Signed: 11/28/2015 4:38:33 PM By: Montey Hora Entered By: Montey Hora on 11/28/2015 16:38:33 Rohlman, Leeanne H. ([id number]) -------------------------------------------------------------------------------- Wound Assessment  Details Patient Name: Orner, Donalee H. Date of Service: 11/28/2015 3:30 PM Medical Record Number: [id number] Patient Account Number: [id number] Date of Birth/Sex: Sep 02, 1946 (70 y.o. Female) Treating RN: Montey Hora Primary Care Physician: Paulita Cradle Other Clinician: Referring Physician: Paulita Cradle Treating Physician/Extender: Frann Rider in Treatment: 10 Wound Status Wound Number: 13 Primary Pressure Ulcer Etiology: Wound Location: Left Calcaneus Wound Open Wounding Event: Pressure Injury Status: Date Acquired: 11/02/2015 Comorbid Cataracts, Asthma, Hypertension, Type Weeks Of Treatment: 3 History: II Diabetes, Neuropathy, Received Clustered Wound: No Chemotherapy Photos Photo Uploaded By: Montey Hora on 11/28/2015 16:55:59 Wound Measurements Length: (cm) 0.4 Width: (cm) 0.4 Depth: (cm) 0.1 Area: (cm) 0.126 Volume: (cm) 0.013 % Reduction in Area: 91.1% % Reduction in Volume: 90.8% Epithelialization: None Tunneling: No Undermining: No Wound Description Classification: Category/Stage II Diabetic Severity Earleen Newport): Grade 1 Wound Margin: Flat and Intact Exudate Amount: Large Exudate Type: Serous Exudate Color: amber Wound Bed Granulation Amount: Medium (34-66%) Exposed Structure Granulation Quality: Red, Pink Fascia Exposed: No Necrotic Amount: Medium (34-66%) Fat Layer Exposed: No Selph, Lianni H. ([id number]) Necrotic Quality: Adherent Slough Tendon Exposed: No Muscle Exposed: No Joint Exposed: No Bone Exposed: No Limited to Skin Breakdown Periwound Skin Texture Texture Color No Abnormalities Noted: No No Abnormalities Noted: No Moisture Temperature / Pain No Abnormalities Noted: No Temperature: No Abnormality Maceration: Yes Tenderness on Palpation: Yes Moist: Yes Wound Preparation Ulcer Cleansing: Rinsed/Irrigated with Saline Topical Anesthetic Applied: Other: lidocaine 4%, Treatment Notes Wound #13 (Left  Calcaneus) 1. Cleansed with: Clean wound with Normal Saline 2. Anesthetic Topical Lidocaine 4% cream to wound bed prior to debridement 4. Dressing Applied: Aquacel Ag 5. Secondary Dressing Applied Bordered Foam Dressing Electronic Signature(s) Signed: 11/28/2015 4:38:43 PM By: Montey Hora Entered By: Montey Hora on 11/28/2015 16:38:43 Pantoja, Iliany H. ([id number]) -------------------------------------------------------------------------------- Wound Assessment Details Patient Name: Linders, Teralyn H. Date of Service: 11/28/2015 3:30 PM Medical Record Number: [id number] Patient Account Number: [id number] Date of Birth/Sex: [birth year] (70 y.o. Female) Treating RN: Montey Hora Primary Care Physician: Paulita Cradle Other Clinician: Referring Physician: Paulita Cradle Treating Physician/Extender: Frann Rider in Treatment: 37 Wound Status Wound Number: 14 Primary Etiology: Venous Leg Ulcer Wound Location: Right, Anterior Lower Leg Wound Status: Healed - Epithelialized Wounding Event: Blister Date Acquired: 11/18/2015 Weeks Of Treatment: 1 Clustered Wound: No Photos Photo Uploaded By: Montey Hora on 11/28/2015 16:56:00 Wound Measurements Length: (cm) 0 % Reduction Width: (cm) 0 % Reduction Depth: (cm) 0 Area: (cm) 0 Volume: (cm) 0 in Area: 100% in Volume: 100% Wound Description Classification: Partial Thickness Periwound Skin Texture Texture Color No Abnormalities Noted: No No Abnormalities Noted: No Moisture No Abnormalities Noted: No Electronic Signature(s) Signed: 12/01/2015 5:01:01 PM By: Penny Pia, Danique H. ([id number]) Entered By: Montey Hora on 11/28/2015 15:55:55 Zacher, Freddy H. ([id number]) -------------------------------------------------------------------------------- Wound Assessment Details Patient Name: Bouyer, Ruberta H. Date of Service: 11/28/2015 3:30 PM Medical Record Number: [id number] Patient Account Number:  [id number] Date of Birth/Sex: Jul 06, 1946 (70 y.o. Female) Treating RN: Montey Hora Primary Care Physician: Paulita Cradle Other Clinician: Referring Physician: Paulita Cradle Treating Physician/Extender: Frann Rider in Treatment: 37 Wound Status Wound Number: 3 Primary Pressure Ulcer Etiology: Wound Location: Back - Midline Wound Open Wounding Event: Pressure Injury Status: Date Acquired: 05/02/2015 Comorbid Cataracts, Asthma, Hypertension, Type  Weeks Of Treatment: 30 History: II Diabetes, Neuropathy, Received Clustered Wound: No Chemotherapy Photos Photo Uploaded By: Montey Hora on 11/28/2015 16:56:56 Wound Measurements Length: (cm) 2 Width: (cm) 2 Depth: (cm) 0.2 Area: (cm) 3.142 Volume: (cm) 0.628 % Reduction in Area: -1328.2% % Reduction in Volume: -2754.5% Epithelialization: None Tunneling: No Undermining: No Wound Description Classification: Category/Stage II Wound Margin: Distinct, outline attached Exudate Amount: Large Exudate Type: Serosanguineous Exudate Color: red, brown Foul Odor After Cleansing: No Wound Bed Granulation Amount: Medium (34-66%) Exposed Structure Granulation Quality: Red, Pink Fascia Exposed: No Necrotic Amount: Medium (34-66%) Fat Layer Exposed: No Necrotic Quality: Adherent Slough Tendon Exposed: No [name], Deondria H. ([id number]) Muscle Exposed: No Joint Exposed: No Bone Exposed: No Limited to Skin Breakdown Periwound Skin Texture Texture Color No Abnormalities Noted: No No Abnormalities Noted: No Callus: No Atrophie Blanche: No Crepitus: No Cyanosis: No Excoriation: No Ecchymosis: No Fluctuance: No Erythema: No Friable: No Hemosiderin Staining: No Induration: No Mottled: No Localized Edema: No Pallor: No Rash: No Rubor: No Scarring: No Temperature / Pain Moisture Temperature: No Abnormality No Abnormalities Noted: No Dry / Scaly: No Maceration: No Moist: Yes Wound Preparation Ulcer  Cleansing: Rinsed/Irrigated with Saline Topical Anesthetic Applied: Other: lidocaine 4%, Treatment Notes Wound #3 (Midline Back) 1. Cleansed with: Clean wound with Normal Saline 2. Anesthetic Topical Lidocaine 4% cream to wound bed prior to debridement 4. Dressing Applied: Aquacel Ag 5. Secondary Dressing Applied Bordered Foam Dressing Electronic Signature(s) Signed: 11/28/2015 4:38:57 PM By: Montey Hora Entered By: Montey Hora on 11/28/2015 16:38:57 Shouse, Rosezella H. ([id number]) -------------------------------------------------------------------------------- Wound Assessment Details Patient Name: Beckner, Lexani H. Date of Service: 11/28/2015 3:30 PM Medical Record Number: [id number] Patient Account Number: [id number] Date of Birth/Sex: 01/09/1946 (70 y.o. 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 Wound Open Wounding Event: Pressure Injury Status: Date Acquired: 08/01/2015 Comorbid Cataracts, Asthma, Hypertension, Type Weeks Of Treatment: 16 History: II Diabetes, Neuropathy, Received Clustered Wound: No Chemotherapy Photos Photo Uploaded By: Montey Hora on 11/28/2015 16:56:57 Wound Measurements Length: (cm) 1.1 Width: (cm) 0.5 Depth: (cm) 0.2 Area: (cm) 0.432 Volume: (cm) 0.086 % Reduction in Area: 86.3% % Reduction in Volume: 72.6% Epithelialization: None Tunneling: No Undermining: No Wound Description Classification: Category/Stage III Wound Margin: Distinct, outline attached Exudate Amount: Medium Exudate Type: Serous Exudate Color: amber Foul Odor After Cleansing: No Wound Bed Granulation Amount: None Present (0%) Exposed Structure Necrotic Amount: Large (67-100%) Fascia Exposed:  No Necrotic Quality: Eschar Fat Layer Exposed: No Tendon Exposed: No Bonifas, Pierce H. ([id number]) Muscle Exposed: No Joint Exposed: No Bone Exposed: No Limited to Skin Breakdown Periwound Skin Texture Texture Color No Abnormalities Noted: No No Abnormalities Noted: No Callus: No Atrophie Blanche: No Crepitus: No Cyanosis: No Excoriation: No Ecchymosis: No Fluctuance: No Erythema: No Friable: No Hemosiderin Staining: No Induration: No Mottled: No Localized Edema: No Pallor: No Rash: No Rubor: No Scarring: No Temperature / Pain Moisture Temperature: No Abnormality No Abnormalities Noted: No Tenderness on Palpation: Yes Dry / Scaly: No Maceration: No Moist: Yes Wound Preparation Ulcer Cleansing: Rinsed/Irrigated with Saline Topical Anesthetic Applied: Other: lidocaine 4%, Treatment Notes Wound #7 (Right Ischial Tuberosity) 1. Cleansed with: Clean wound with Normal Saline 2. Anesthetic Topical Lidocaine 4% cream to wound bed prior to debridement 4. Dressing Applied: Santyl Ointment 5. Secondary Dressing Applied Bordered Foam Dressing Electronic Signature(s)  Signed: 11/28/2015 4:39:08 PM By: Montey Hora Entered By: Montey Hora on 11/28/2015 16:39:08 Reichel, Zareah H. ([id number]) -------------------------------------------------------------------------------- Wound Assessment Details Patient Name: Siebers, Evvie H. Date of Service: 11/28/2015 3:30 PM Medical Record Number: [id number] Patient Account Number: [id number] Date of Birth/Sex: 04/25/1946 (70 y.o. Female) Treating RN: Montey Hora Primary Care Physician: Paulita Cradle Other Clinician: Referring Physician: Paulita Cradle Treating Physician/Extender: Frann Rider in Treatment: 30 Wound Status Wound Number: 8 Primary Pressure Ulcer Etiology: Wound Location: Left Malleolus Wound Open Wounding Event: Gradually Appeared Status: Date Acquired: 09/12/2015 Comorbid Cataracts, Asthma,  Hypertension, Type Weeks Of Treatment: 10 History: II Diabetes, Neuropathy, Received Clustered Wound: No Chemotherapy Photos Photo Uploaded By: Montey Hora on 11/28/2015 16:57:37 Wound Measurements Length: (cm) 1.1 Width: (cm) 1.1 Depth: (cm) 0.2 Area: (cm) 0.95 Volume: (cm) 0.19 % Reduction in Area: -1921.3% % Reduction in Volume: -2011.1% Epithelialization: None Tunneling: No Undermining: No Wound Description Classification: Category/Stage II Foul Odor A Diabetic Severity (Wagner): Grade 0 Wound Margin: Flat and Intact Exudate Amount: Medium Exudate Type: Serous Exudate Color: amber fter Cleansing: No Wound Bed Granulation Amount: Medium (34-66%) Exposed Structure Necrotic Amount: Medium (34-66%) Fascia Exposed: No Necrotic Quality: Adherent Slough Fat Layer Exposed: No Martian, Devona H. ([id number]) Tendon Exposed: No Muscle Exposed: No Joint Exposed: No Bone Exposed: No Limited to Skin Breakdown Periwound Skin Texture Texture Color No Abnormalities Noted: No No Abnormalities Noted: No Callus: No Atrophie Blanche: No Crepitus: No Cyanosis: No Excoriation: No Ecchymosis: No Fluctuance: No Erythema: Yes Friable: No Erythema Location: Circumferential Induration: No Hemosiderin Staining: No Localized Edema: No Mottled: No Rash: No Pallor: No Scarring: No Rubor: No Moisture Temperature / Pain No Abnormalities Noted: No Temperature: No Abnormality Dry / Scaly: No Tenderness on Palpation: Yes Maceration: No Moist: Yes Wound Preparation Ulcer Cleansing: Rinsed/Irrigated with Saline Topical Anesthetic Applied: Other: Lidocaine 4%, Treatment Notes Wound #8 (Left Malleolus) 1. Cleansed with: Clean wound with Normal Saline 2. Anesthetic Topical Lidocaine 4% cream to wound bed prior to debridement 4. Dressing Applied: Santyl Ointment 5. Secondary Dressing Applied Bordered Foam Dressing Electronic Signature(s) Signed: 11/28/2015 4:39:20 PM By:  Montey Hora Entered By: Montey Hora on 11/28/2015 16:39:20 Miklas, Elyn H. ([id number]) -------------------------------------------------------------------------------- Wound Assessment Details Patient Name: Barren, Jlee H. Date of Service: 11/28/2015 3:30 PM Medical Record Number: [id number] Patient Account Number: [id number] Date of Birth/Sex: 09-16-1945 (70 y.o. Female) Treating RN: Montey Hora Primary Care Physician: Paulita Cradle Other Clinician: Referring Physician: Paulita Cradle Treating Physician/Extender: Frann Rider in Treatment: 38 Wound Status Wound Number: 9 Primary Pressure Ulcer Etiology: Wound Location: Left Ischial Tuberosity Wound Open Wounding Event: Gradually Appeared Status: Date Acquired: 09/12/2015 Comorbid Cataracts, Asthma, Hypertension, Type Weeks Of Treatment: 10 History: II Diabetes, Neuropathy, Received Clustered Wound: No Chemotherapy Photos Photo Uploaded By: Montey Hora on 11/28/2015 16:57:38 Wound Measurements Length: (cm) 0.2 Width: (cm) 0.2 Depth: (cm) 0.1 Area: (cm) 0.031 Volume: (cm) 0.003 % Reduction in Area: 84.2% % Reduction in Volume: 85% Epithelialization: None Tunneling: No Undermining: No Wound Description Classification: Category/Stage II Wound Margin: Flat and Intact Exudate Amount: Medium Exudate Type: Serous Exudate Color: amber Foul Odor After Cleansing: No Wound Bed Granulation Amount: None Present (0%) Exposed Structure Necrotic Amount: Large (67-100%) Fascia Exposed: No Necrotic Quality: Adherent Slough Fat Layer Exposed: No Tendon Exposed: No Parrow, Thresia H. ([id number]) Muscle Exposed: No Joint Exposed: No Bone Exposed: No Limited to Skin Breakdown Periwound Skin Texture Texture Color No Abnormalities Noted: No No Abnormalities Noted: No Callus: No Atrophie Blanche: No Crepitus:  No Cyanosis: No Excoriation: No Ecchymosis: No Fluctuance: No Erythema: No Friable:  No Hemosiderin Staining: No Induration: No Mottled: No Localized Edema: No Pallor: No Rash: No Rubor: No Scarring: No Temperature / Pain Moisture Temperature: No Abnormality No Abnormalities Noted: No Dry / Scaly: No Maceration: No Moist: Yes Wound Preparation Ulcer Cleansing: Rinsed/Irrigated with Saline Topical Anesthetic Applied: Other: lidocaine 4%, Treatment Notes Wound #9 (Left Ischial Tuberosity) 1. Cleansed with: Clean wound with Normal Saline 2. Anesthetic Topical Lidocaine 4% cream to wound bed prior to debridement 4. Dressing Applied: Aquacel Ag 5. Secondary Dressing Applied Bordered Foam Dressing Electronic Signature(s) Signed: 11/28/2015 4:39:32 PM By: Montey Hora Entered By: Montey Hora on 11/28/2015 16:39:32 Rash, Kinslei H. ([id number]) -------------------------------------------------------------------------------- Vitals Details Patient Name: Mckenzie Fusi, Britiney H. Date of Service: 11/28/2015 3:30 PM Medical Record Number: [id number] Patient Account Number: [id number] Date of Birth/Sex: Nov 22, 1945 (70 y.o. Female) Treating RN: Montey Hora Primary Care Physician: Paulita Cradle Other Clinician: Referring Physician: Paulita Cradle Treating Physician/Extender: Frann Rider in Treatment: 37 Vital Signs Time Taken: 15:45 Temperature (F): 98.4 Height (in): 65 Pulse (bpm): 100 Weight (lbs): 100 Respiratory Rate (breaths/min): 18 Body Mass Index (BMI): 16.6 Blood Pressure (mmHg): 130/61 Reference Range: 80 - 120 mg / dl Electronic Signature(s) Signed: 12/01/2015 5:01:01 PM By: Montey Hora Entered By: Montey Hora on 11/28/2015 15:46:00

## 2015-11-29 NOTE — Progress Notes (Addendum)
SANT, [name] ([id number]) Visit Report for 11/28/2015 Chief Complaint Document Details Patient Name: [name], [name] Date of Service: 11/28/2015 3:30 PM Medical Record Number: [id number] Patient Account Number: [id number] Date of Birth/Sex: August 05, 1946 (69 y.o. Female) Treating RN: Montey Hora Primary Care Physician: Paulita Cradle Other Clinician: Referring Physician: Paulita Cradle Treating Physician/Extender: Frann Rider in Treatment: 37 Information Obtained from: Patient Chief Complaint Patient presents to the wound care center for a consult due non healing wound. Ulcers on the right elbow and the right heel for about 1 month. Electronic Signature(s) Signed: 11/28/2015 3:40:25 PM By: Christin Fudge MD, FACS Entered By: Christin Fudge on 11/28/2015 15:40:25 [name], [name] ([id number]) -------------------------------------------------------------------------------- HPI Details Patient Name: [name], [name] Date of Service: 11/28/2015 3:30 PM Medical Record Number: [id number] Patient Account Number: [id number] Date of Birth/Sex: 06/12/1946 (69 y.o. Female) Treating RN: Montey Hora Primary Care Physician: Paulita Cradle Other Clinician: Referring Physician: Paulita Cradle Treating Physician/Extender: Frann Rider in Treatment: 37 History of Present Illness Location: Ulceration on the right heel and the right elbow. Quality: Patient reports experiencing a dull pain to affected area(s). Severity: Patient states wound (s) are getting better. Duration: Patient has had the wound for < 4 weeks prior to presenting for treatment Timing: Pain in wound is Intermittent (comes and goes Context: The wound appeared gradually over time Modifying Factors: Consults to this date include:Augmentin and Bactrim and also some heel protection with duoderm Associated Signs and Symptoms: Patient reports having difficulty standing for long periods. HPI Description:  70 year old female with history of peripheral neuropathy, history of diet controlled diabetes mellitus type 2, history of alcoholism here for wound consult sent by her PCP Dr. Sherilyn Cooter. She has pressure ulcers at her right elbow, bilateral heels. Plain films of right calcaneus without acute bony process. Patient started by PCP on Augmentin, Bactrim as per orders, DuoDerm dressings applied - reports some improvement in her ulcer since last seen. Denies fever, chills, nausea, vomiting, diarrhea. She had a right humerus fracture in the middle of May and has had no surgery and arm is in a sling. She is also been laying in the bed for quite a while. Past medical history significant for essential hypertension, osteoporosis, peripheral neuropathy, alcoholism, ataxia, personal history of breast cancer treated with surgery chemotherapy and radiation and this was done in December 2010. she is also status post laparoscopic cholecystectomy, pilonidal cyst excision, subcutaneous port placement, partial mastectomy on the left side, skin cancer removal. 03/21/2015 -- she says overall she's been doing better and continues to smoke about 15 cigarettes a day. 03/21/2015 - her orthopedic doctor has said she may require surgery for her right humerus fracture. 04/04/2015 -- her orthopedic surgery has been scheduled for August 11. 04/18/2015 -- she is doing fine as far as her elbow and her right heel goes but she has developed some redness over prominence on her thoracic spine and wanted me to take a look at this. 05/02/2015 -- she had her surgery done and now is in a sling and support. Her back has developed a pressure injury of undetermined stage. She seems to be in better spirits. 05/16/2015 -- last week her right heel was looking great and we had healed it out, but she has not been offloading appropriately and has a deep tissue injury on the right heel again. The area on her right elbow has opened out with slough and  the area in the thoracic spine is also getting worse. 05/30/2015 -- she has developed  2 new ulcerations one on her left ischial tuberosity and one on the sacral region. She is still working on getting up smoking but is also unable to take her vitamins as she says she develops a diarrhea when she takes vitamins. She has increased her intake of proteins. 06/06/2015 -- the patient's husband manages to get her a low air loss mattress with initiating pressure but [name], [name] ([id number]) did not know how to use it exactly and the patient was not happy about using it. Overall she says she's been feeling better. 07/11/2015 -- . Discussed a surgical opinion for debridement and application of a wound VAC, 2 weeks ago but the patient has been reluctant to get a surgical opinion as she wants to avoid surgery. 07/18/2015 -- they have an appointment to see Dr. Tamala Julian this coming Wednesday. 07/29/2015 -- they saw Dr. Tamala Julian in his office and he did a debridement of the wound and removed significant amount of slough. This is in addition to the debridement I had done previously on Friday where a large amount of the eschar was removed. He did not recommend the application of wound VAC. Addendum : Dr. Thompson Caul note was reviewed via EPIC and details noted as above. 08/05/2015 -- over the last week she has developed a another pressure injury to her right hip and has had significant discoloration of the skin and an eschar there. 08/15/2015 -- she is still smoking about a pack of cigarettes a day and does not seem to want to quit. They have not been able to talk to the vendor regarding the air mattress and I will ask them to get in touch again. She is reluctant to take vitamins and does admit her nutrition is poor. 08/22/2015 -- she has been unable to tolerate her vitamins and continues to smoke. They're working on getting a low air loss mattress and have been speaking with the vendor. 09/19/2015 -- since her last  visit and was admitted to the hospital between 09/09/2015 and 09/16/2015. She was thought to have an active sepsis possibly from one of her decubitus ulcers but nothing was grown except for an MSSA from her thoracic spine region. CT of this area did not show any osteomyelitis. Been given IV antibiotics which included vancomycin and Zosyn in the hospital under the care of Dr. [name] the ID specialist she was sent home on oral Keflex 500 mg 3 times a day for 2 weeks. he will consider an MRI in the outpatient setting to completely rule out osteomyelitis of the spine. 10/03/2015 --readmitted to hospital on 09/23/2015 for general debility, lethargy and possible sepsis and workup was in progress. Seen by Dr. [name] and he would also like a workup for underlying malignancy as sheos had previous ultrasounds of the breasts suggesting findings of concern. Workup done so far does not suggest deep bony infection. She was treated for a pneumonia and received Zosyn and vancomycin. She had been recommended Cipro 500 twice a day and doxycycline 100 mg twice a day once she was to be [place] home. The antibiotics were to be stopped on 10/07/2015. 10/17/2015 -- patient is feeling much better and has been eating better and doing her offloading as much as possible. 10/23/2015 -- she has an appointment to see Dr. [name] tomorrow but other than that has been doing as much as possible with offloading and increasing her diet. 11/07/2015 -- she has not been here to see [place] for 2 weeks and at the present time continues to  try and eat better, work on off loading and is using the air mattress at night. She saw her PCP yesterday and she has put her back on a fentanyl patch. 11/21/2015 -- for some strange reason she has been sitting in a chair with her legs dependent for the last 6 days nonstop and has developed massive lower extremity edema and pedal edema with weeping and ulceration. Electronic  Signature(s) Signed: 11/28/2015 3:40:32 PM By: Christin Fudge MD, FACS Entered By: Christin Fudge on 11/28/2015 15:40:32 [name], [name] ([id number]) -------------------------------------------------------------------------------- Physical Exam Details Patient Name: Meuser, Colleen H. Date of Service: 11/28/2015 3:30 PM Medical Record Number: [id number] Patient Account Number: [id number] Date of Birth/Sex: 1946-03-22 (69 y.o. Female) Treating RN: Montey Hora Primary Care Physician: Paulita Cradle Other Clinician: Referring Physician: Paulita Cradle Treating Physician/Extender: Frann Rider in Treatment: 37 Constitutional . Pulse regular. Respirations normal and unlabored. Afebrile. . Eyes Nonicteric. Reactive to light. Ears, Nose, Mouth, and Throat Lips, teeth, and gums WNL.Marland Kitchen Moist mucosa without lesions. Neck supple and nontender. No palpable supraclavicular or cervical adenopathy. Normal sized without goiter. Respiratory WNL. No retractions.. Cardiovascular Pedal Pulses WNL. No clubbing, cyanosis or edema. Lymphatic No adneopathy. No adenopathy. No adenopathy. Musculoskeletal Adexa without tenderness or enlargement.. Digits and nails w/o clubbing, cyanosis, infection, petechiae, ischemia, or inflammatory conditions.. Integumentary (Hair, Skin) No suspicious lesions. No crepitus or fluctuance. No peri-wound warmth or erythema. No masses.Marland Kitchen Psychiatric Judgement and insight Intact.. No evidence of depression, anxiety, or agitation.. Notes She has made an excellent recovery compared to last week and the massive lymphedema of both lower extremities as gone down markedly. Few of the wounds have significant amount of slough which cannot be removed with a curette as she is too tender to sharply and debride this.we will use Santyl for these wounds. The rest of them will covered with silver alginate Electronic Signature(s) Signed: 11/28/2015 4:23:15 PM By: Christin Fudge MD,  FACS Previous Signature: 11/28/2015 3:41:28 PM Version By: Christin Fudge MD, FACS Entered By: Christin Fudge on 11/28/2015 16:23:15 [name], [name] ([id number]) -------------------------------------------------------------------------------- Physician Orders Details Patient Name: Edgerly, Myda H. Date of Service: 11/28/2015 3:30 PM Medical Record Number: [id number] Patient Account Number: [id number] Date of Birth/Sex: 1946-03-14 (69 y.o. Female) Treating RN: Montey Hora Primary Care Physician: Paulita Cradle Other Clinician: Referring Physician: Paulita Cradle Treating Physician/Extender: Frann Rider in Treatment: 12 Verbal / Phone Orders: Yes Clinician: Montey Hora Read Back and Verified: Yes Diagnosis Coding ICD-10 Coding Code Description 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, stage 3 F17.218 Nicotine dependence, cigarettes, with other nicotine-induced disorders L89.100 Pressure ulcer of unspecified part of back, unstageable L89.213 Pressure ulcer of right hip, stage 3 I89.0 Lymphedema, not elsewhere classified L97.211 Non-pressure chronic ulcer of right calf limited to breakdown of skin Wound Cleansing Wound #10 Right,Medial Foot o Clean wound with Normal Saline. o Cleanse wound with mild soap and water Wound #11 Right,Distal Calcaneus o Clean wound with Normal Saline. o Cleanse wound with mild soap and water Wound #13 Left Calcaneus o Clean wound with Normal Saline. o Cleanse wound with mild soap and water Wound #3 Midline Back o Clean wound with Normal Saline. o Cleanse wound with mild soap and water Wound #7 Right Ischial Tuberosity o Clean wound with Normal Saline. o Cleanse wound with mild soap and water Wound #8 Left Malleolus o Clean wound with Normal Saline. o Cleanse wound with mild soap and water Wound #9 Left Ischial Tuberosity [name], Mckenzie  H. ([id number]) o Clean  wound with Normal Saline. o Cleanse wound with mild soap and water Anesthetic Wound #10 Right,Medial Foot o Topical Lidocaine 4% cream applied to wound bed prior to debridement Wound #11 Right,Distal Calcaneus o Topical Lidocaine 4% cream applied to wound bed prior to debridement Wound #13 Left Calcaneus o Topical Lidocaine 4% cream applied to wound bed prior to debridement Wound #3 Midline Back o Topical Lidocaine 4% cream applied to wound bed prior to debridement Wound #7 Right Ischial Tuberosity o Topical Lidocaine 4% cream applied to wound bed prior to debridement Wound #8 Left Malleolus o Topical Lidocaine 4% cream applied to wound bed prior to debridement Wound #9 Left Ischial Tuberosity o Topical Lidocaine 4% cream applied to wound bed prior to debridement Skin Barriers/Peri-Wound Care Wound #10 Right,Medial Foot o Skin Prep Wound #11 Right,Distal Calcaneus o Skin Prep Wound #13 Left Calcaneus o Skin Prep Wound #3 Midline Back o Skin Prep Wound #7 Right Ischial Tuberosity o Skin Prep Wound #8 Left Malleolus o Skin Prep Wound #9 Left Ischial Tuberosity o Skin Prep Primary Wound Dressing [name], [name] ([id number]) Wound #11 Right,Distal Calcaneus o Santyl Ointment Wound #7 Right Ischial Tuberosity o Santyl Ointment Wound #8 Left Malleolus o Santyl Ointment Wound #10 Right,Medial Foot o Aquacel Ag Wound #13 Left Calcaneus o Aquacel Ag Wound #3 Midline Back o Aquacel Ag Wound #9 Left Ischial Tuberosity o Aquacel Ag Secondary Dressing Wound #10 Right,Medial Foot o Boardered Foam Dressing Wound #11 Right,Distal Calcaneus o Boardered Foam Dressing Wound #13 Left Calcaneus o Boardered Foam Dressing Wound #3 Midline Back o Boardered Foam Dressing Wound #7 Right Ischial Tuberosity o Boardered Foam Dressing Wound #8 Left Malleolus o Boardered Foam Dressing Wound #9 Left Ischial Tuberosity o Boardered  Foam Dressing Dressing Change Frequency Wound #10 Right,Medial Foot o Change dressing every day. Wound #11 Right,Distal Calcaneus o Change dressing every day. [name], [name] ([id number]) Wound #13 Left Calcaneus o Change dressing every day. Wound #3 Midline Back o Change dressing every day. Wound #7 Right Ischial Tuberosity o Change dressing every day. Wound #8 Left Malleolus o Change dressing every day. Wound #9 Left Ischial Tuberosity o Change dressing every day. Follow-up Appointments Wound #10 Right,Medial Foot o Return Appointment in 2 weeks. Wound #11 Right,Distal Calcaneus o Return Appointment in 2 weeks. Wound #13 Left Calcaneus o Return Appointment in 2 weeks. Wound #3 Midline Back o Return Appointment in 2 weeks. Wound #7 Right Ischial Tuberosity o Return Appointment in 2 weeks. Wound #8 Left Malleolus o Return Appointment in 2 weeks. Wound #9 Left Ischial Tuberosity o Return Appointment in 2 weeks. Edema Control o Elevate legs to the level of the heart and pump ankles as often as possible Off-Loading Wound #10 Right,Medial Foot o Turn and reposition every 2 hours o Other: - float heels while in the bed Wound #11 Right,Distal Calcaneus o Turn and reposition every 2 hours o Other: - float heels while in the bed [name], [name] ([id number]) Wound #13 Left Calcaneus o Turn and reposition every 2 hours o Other: - float heels while in the bed Wound #3 Midline Back o Turn and reposition every 2 hours o Other: - float heels while in the bed Wound #7 Right Ischial Tuberosity o Turn and reposition every 2 hours o Other: - float heels while in the bed Wound #8 Left Malleolus o Turn and reposition every 2 hours o Other: - float heels while in the bed Wound #9 Left Ischial Tuberosity   o Turn and reposition every 2 hours o Other: - float heels while in the bed [place] #10 [place] Visits - [place] Nurse may visit PRN to address patientos wound care needs. o FACE TO FACE ENCOUNTER: MEDICARE and MEDICAID PATIENTS: I certify that this patient is under my care and that I had a face-to-face encounter that meets the physician face-to-face encounter requirements with this patient on this date. The encounter with the patient was in whole or in part for the following MEDICAL CONDITION: (primary reason for [place]) MEDICAL NECESSITY: I certify, that based on my findings, NURSING services are a medically necessary home health service. HOME BOUND STATUS: I certify that my clinical findings support that this patient is homebound (i.e., Due to illness or injury, pt requires aid of supportive devices such as crutches, cane, wheelchairs, walkers, the use of special transportation or the assistance of another person to leave their place of residence. There is a normal inability to leave the home and doing so requires considerable and taxing effort. Other absences are for medical reasons / religious services and are infrequent or of short duration when for other reasons). o If current dressing causes regression in wound condition, may D/C ordered dressing product/s and apply Normal Saline Moist Dressing daily until next [place] / Other MD appointment. [place] of regression in wound condition at [phone]. o Please direct any NON-WOUND related issues/requests for orders to patient's Primary Care Physician Wound #11 [place] Visits - [place] Nurse may visit PRN to address patientos wound care needs. o FACE TO FACE ENCOUNTER: MEDICARE and MEDICAID PATIENTS: I certify that this patient is under my care and that I had a face-to-face encounter that meets the physician face-to-face encounter requirements with this patient on this date. The encounter  with the patient was in [place], [place]. ([id number]) whole or in part for the following MEDICAL CONDITION: (primary reason for [place]) MEDICAL NECESSITY: I certify, that based on my findings, NURSING services are a medically necessary home health service. HOME BOUND STATUS: I certify that my clinical findings support that this patient is homebound (i.e., Due to illness or injury, pt requires aid of supportive devices such as crutches, cane, wheelchairs, walkers, the use of special transportation or the assistance of another person to leave their place of residence. There is a normal inability to leave the home and doing so requires considerable and taxing effort. Other absences are for medical reasons / religious services and are infrequent or of short duration when for other reasons). o If current dressing causes regression in wound condition, may D/C ordered dressing product/s and apply Normal Saline Moist Dressing daily until next [place] / Other MD appointment. [place] of regression in wound condition at [phone]. o Please direct any NON-WOUND related issues/requests for orders to patient's Primary Care Physician Wound #13 Left Calcaneus o [place] Visits - [place] Nurse may visit PRN to address patientos wound care needs. o FACE TO FACE ENCOUNTER: MEDICARE and MEDICAID PATIENTS: I certify that this patient is under my care and that I had a face-to-face encounter that meets the physician face-to-face encounter requirements with this patient on this date. The encounter with the patient was in whole or in part for the following MEDICAL CONDITION: (primary reason for [place]) MEDICAL NECESSITY: I certify, that based on  my findings, NURSING services are a medically necessary home health service. HOME BOUND STATUS: I certify that my clinical findings support that this patient is homebound (i.e., Due to  illness or injury, pt requires aid of supportive devices such as crutches, cane, wheelchairs, walkers, the use of special transportation or the assistance of another person to leave their place of residence. There is a normal inability to leave the home and doing so requires considerable and taxing effort. Other absences are for medical reasons / religious services and are infrequent or of short duration when for other reasons). o If current dressing causes regression in wound condition, may D/C ordered dressing product/s and apply Normal Saline Moist Dressing daily until next [place] / Other MD appointment. [place] of regression in wound condition at [phone]. o Please direct any NON-WOUND related issues/requests for orders to patient's Primary Care Physician Wound #3 Midline Back o [place] Visits - [place] Nurse may visit PRN to address patientos wound care needs. o FACE TO FACE ENCOUNTER: MEDICARE and MEDICAID PATIENTS: I certify that this patient is under my care and that I had a face-to-face encounter that meets the physician face-to-face encounter requirements with this patient on this date. The encounter with the patient was in whole or in part for the following MEDICAL CONDITION: (primary reason for [place]) MEDICAL NECESSITY: I certify, that based on my findings, NURSING services are a medically necessary home health service. HOME BOUND STATUS: I certify that my clinical findings support that this patient is homebound (i.e., Due to illness or injury, pt requires aid of supportive devices such as crutches, cane, wheelchairs, walkers, the use of special transportation or the assistance of another person to leave their place of residence. There is a normal inability to leave the home and doing so requires considerable and taxing effort. Other [place], [name] ([id number]) absences are for medical reasons /  religious services and are infrequent or of short duration when for other reasons). o If current dressing causes regression in wound condition, may D/C ordered dressing product/s and apply Normal Saline Moist Dressing daily until next [place] / Other MD appointment. [place] of regression in wound condition at [phone]. o Please direct any NON-WOUND related issues/requests for orders to patient's Primary Care Physician Wound #7 Right Ischial [place] Visits - [place] Nurse may visit PRN to address patientos wound care needs. o FACE TO FACE ENCOUNTER: MEDICARE and MEDICAID PATIENTS: I certify that this patient is under my care and that I had a face-to-face encounter that meets the physician face-to-face encounter requirements with this patient on this date. The encounter with the patient was in whole or in part for the following MEDICAL CONDITION: (primary reason for [place]) MEDICAL NECESSITY: I certify, that based on my findings, NURSING services are a medically necessary home health service. HOME BOUND STATUS: I certify that my clinical findings support that this patient is homebound (i.e., Due to illness or injury, pt requires aid of supportive devices such as crutches, cane, wheelchairs, walkers, the use of special transportation or the assistance of another person to leave their place of residence. There is a normal inability to leave the home and doing so requires considerable and taxing effort. Other absences are for medical reasons / religious services and are infrequent or of short duration when for other reasons). o If current dressing causes regression in wound condition,  may D/C ordered dressing product/s and apply Normal Saline Moist Dressing daily until next [place] / Other MD appointment. [place] of regression in wound condition at [phone]. o  Please direct any NON-WOUND related issues/requests for orders to patient's Primary Care Physician Wound #8 Left [place] Visits - [place] Nurse may visit PRN to address patientos wound care needs. o FACE TO FACE ENCOUNTER: MEDICARE and MEDICAID PATIENTS: I certify that this patient is under my care and that I had a face-to-face encounter that meets the physician face-to-face encounter requirements with this patient on this date. The encounter with the patient was in whole or in part for the following MEDICAL CONDITION: (primary reason for [place]) MEDICAL NECESSITY: I certify, that based on my findings, NURSING services are a medically necessary home health service. HOME BOUND STATUS: I certify that my clinical findings support that this patient is homebound (i.e., Due to illness or injury, pt requires aid of supportive devices such as crutches, cane, wheelchairs, walkers, the use of special transportation or the assistance of another person to leave their place of residence. There is a normal inability to leave the home and doing so requires considerable and taxing effort. Other absences are for medical reasons / religious services and are infrequent or of short duration when for other reasons). o If current dressing causes regression in wound condition, may D/C ordered dressing product/s and apply Normal Saline Moist Dressing daily until next [place] / Other MD appointment. [place] of regression in wound condition at [phone]. o Please direct any NON-WOUND related issues/requests for orders to patient's Primary Care Physician [place], Mckenzie HMarland Kitchen ([id number]) Wound #9 Left Ischial [place] Visits - [place] Nurse may visit PRN to address patientos wound care needs. o FACE TO FACE ENCOUNTER: MEDICARE and MEDICAID PATIENTS: I certify that this patient is under  my care and that I had a face-to-face encounter that meets the physician face-to-face encounter requirements with this patient on this date. The encounter with the patient was in whole or in part for the following MEDICAL CONDITION: (primary reason for [place]) MEDICAL NECESSITY: I certify, that based on my findings, NURSING services are a medically necessary home health service. HOME BOUND STATUS: I certify that my clinical findings support that this patient is homebound (i.e., Due to illness or injury, pt requires aid of supportive devices such as crutches, cane, wheelchairs, walkers, the use of special transportation or the assistance of another person to leave their place of residence. There is a normal inability to leave the home and doing so requires considerable and taxing effort. Other absences are for medical reasons / religious services and are infrequent or of short duration when for other reasons). o If current dressing causes regression in wound condition, may D/C ordered dressing product/s and apply Normal Saline Moist Dressing daily until next [place] / Other MD appointment. [place] of regression in wound condition at [phone]. o Please direct any NON-WOUND related issues/requests for orders to patient's Primary Care Physician Electronic Signature(s) Signed: 11/28/2015 4:43:01 PM By: Montey Hora Signed: 12/01/2015 4:23:25 PM By: Christin Fudge MD, FACS Previous Signature: 11/28/2015 4:37:22 PM Version By: Montey Hora Previous Signature: 11/28/2015 4:42:01 PM Version By: Christin Fudge MD, FACS Entered By: Montey Hora on 11/28/2015 16:43:01 [place], Anber H. ([id number]) -------------------------------------------------------------------------------- Problem List Details Patient Name: Hoon, Zuzanna H. Date of Service:  11/28/2015 3:30 PM Medical Record Number: [id number] Patient Account Number: [id number] Date of Birth/Sex:  08/27/1946 (69 y.o. Female) Treating RN: Montey Hora Primary Care Physician: Paulita Cradle Other Clinician: Referring Physician: Paulita Cradle Treating Physician/Extender: Frann Rider in Treatment: 37 Active Problems ICD-10 Encounter Code Description Active Date Diagnosis E11.621 Type 2 diabetes mellitus with foot ulcer 03/13/2015 Yes L89.613 Pressure ulcer of right heel, stage 3 03/13/2015 Yes F17.218 Nicotine dependence, cigarettes, with other nicotine- 03/13/2015 Yes induced disorders L89.100 Pressure ulcer of unspecified part of back, unstageable 05/02/2015 Yes L89.213 Pressure ulcer of right hip, stage 3 08/05/2015 Yes I89.0 Lymphedema, not elsewhere classified 11/21/2015 Yes L97.211 Non-pressure chronic ulcer of right calf limited to 11/21/2015 Yes breakdown of skin Inactive Problems Resolved Problems ICD-10 Code Description Active Date Resolved Date L89.013 Pressure ulcer of right elbow, stage 3 03/13/2015 03/13/2015 [id number] Pressure ulcer of left hip, stage 2 05/30/2015 05/30/2015 [place], Alayja H. ([id number]) L89.153 Pressure ulcer of sacral region, stage 3 05/30/2015 05/30/2015 Electronic Signature(s) Signed: 11/28/2015 3:40:12 PM By: Christin Fudge MD, FACS Entered By: Christin Fudge on 11/28/2015 15:40:12 [place], Elle H. ([id number]) -------------------------------------------------------------------------------- Progress Note Details Patient Name: Cardella, Miroslava H. Date of Service: 11/28/2015 3:30 PM Medical Record Number: [id number] Patient Account Number: [id number] Date of Birth/Sex: 01-04-1946 (69 y.o. Female) Treating RN: Montey Hora Primary Care Physician: Paulita Cradle Other Clinician: Referring Physician: Paulita Cradle Treating Physician/Extender: Frann Rider in Treatment: 37 Subjective Chief Complaint Information obtained from Patient Patient presents to the wound care center for a consult due non healing wound. Ulcers on the  right elbow and the right heel for about 1 month. History of Present Illness (HPI) The following HPI elements were documented for the patient's wound: Location: Ulceration on the right heel and the right elbow. Quality: Patient reports experiencing a dull pain to affected area(s). Severity: Patient states wound (s) are getting better. Duration: Patient has had the wound for < 4 weeks prior to presenting for treatment Timing: Pain in wound is Intermittent (comes and goes Context: The wound appeared gradually over time Modifying Factors: Consults to this date include:Augmentin and Bactrim and also some heel protection with duoderm Associated Signs and Symptoms: Patient reports having difficulty standing for long periods. 70 year old female with history of peripheral neuropathy, history of diet controlled diabetes mellitus type 2, history of alcoholism here for wound consult sent by her PCP Dr. Sherilyn Cooter. She has pressure ulcers at her right elbow, bilateral heels. Plain films of right calcaneus without acute bony process. Patient started by PCP on Augmentin, Bactrim as per orders, DuoDerm dressings applied - reports some improvement in her ulcer since last seen. Denies fever, chills, nausea, vomiting, diarrhea. She had a right humerus fracture in the middle of May and has had no surgery and arm is in a sling. She is also been laying in the bed for quite a while. Past medical history significant for essential hypertension, osteoporosis, peripheral neuropathy, alcoholism, ataxia, personal history of breast cancer treated with surgery chemotherapy and radiation and this was done in December 2010. she is also status post laparoscopic cholecystectomy, pilonidal cyst excision, subcutaneous port placement, partial mastectomy on the left side, skin cancer removal. 03/21/2015 -- she says overall she's been doing better and continues to smoke about 15 cigarettes a day. 03/21/2015 - her orthopedic doctor has  said she may require surgery for her right humerus fracture. 04/04/2015 -- her orthopedic surgery has been scheduled for August 11. 04/18/2015 -- she is doing fine as far as her elbow  and her right heel goes but she has developed some redness over prominence on her thoracic spine and wanted me to take a look at this. Baley, Rotha H. ([id number]) 05/02/2015 -- she had her surgery done and now is in a sling and support. Her back has developed a pressure injury of undetermined stage. She seems to be in better spirits. 05/16/2015 -- last week her right heel was looking great and we had healed it out, but she has not been offloading appropriately and has a deep tissue injury on the right heel again. The area on her right elbow has opened out with slough and the area in the thoracic spine is also getting worse. 05/30/2015 -- she has developed 2 new ulcerations one on her left ischial tuberosity and one on the sacral region. She is still working on getting up smoking but is also unable to take her vitamins as she says she develops a diarrhea when she takes vitamins. She has increased her intake of proteins. 06/06/2015 -- the patient's husband manages to get her a low air loss mattress with initiating pressure but did not know how to use it exactly and the patient was not happy about using it. Overall she says she's been feeling better. 07/11/2015 -- . Discussed a surgical opinion for debridement and application of a wound VAC, 2 weeks ago but the patient has been reluctant to get a surgical opinion as she wants to avoid surgery. 07/18/2015 -- they have an appointment to see Dr. Tamala Julian this coming Wednesday. 07/29/2015 -- they saw Dr. Tamala Julian in his office and he did a debridement of the wound and removed significant amount of slough. This is in addition to the debridement I had done previously on Friday where a large amount of the eschar was removed. He did not recommend the application of wound  VAC. Addendum : Dr. Thompson Caul note was reviewed via EPIC and details noted as above. 08/05/2015 -- over the last week she has developed a another pressure injury to her right hip and has had significant discoloration of the skin and an eschar there. 08/15/2015 -- she is still smoking about a pack of cigarettes a day and does not seem to want to quit. They have not been able to talk to the vendor regarding the air mattress and I will ask them to get in touch again. She is reluctant to take vitamins and does admit her nutrition is poor. 08/22/2015 -- she has been unable to tolerate her vitamins and continues to smoke. They're working on getting a low air loss mattress and have been speaking with the vendor. 09/19/2015 -- since her last visit and was admitted to the hospital between 09/09/2015 and 09/16/2015. She was thought to have an active sepsis possibly from one of her decubitus ulcers but nothing was grown except for an MSSA from her thoracic spine region. CT of this area did not show any osteomyelitis. Been given IV antibiotics which included vancomycin and Zosyn in the hospital under the care of Dr. [name] the ID specialist she was sent home on oral Keflex 500 mg 3 times a day for 2 weeks. he will consider an MRI in the outpatient setting to completely rule out osteomyelitis of the spine. 10/03/2015 --readmitted to hospital on 09/23/2015 for general debility, lethargy and possible sepsis and workup was in progress. Seen by Dr. [name] and he would also like a workup for underlying malignancy as she s had previous ultrasounds of the breasts suggesting findings of concern.  Workup done so far does not suggest deep bony infection. She was treated for a pneumonia and received Zosyn and vancomycin. She had been recommended Cipro 500 twice a day and doxycycline 100 mg twice a day once she was to be [place] home. The antibiotics were to be stopped on 10/07/2015. 10/17/2015 -- patient is feeling  much better and has been eating better and doing her offloading as much as possible. 10/23/2015 -- she has an appointment to see Dr. [name] tomorrow but other than that has been doing as much as possible with offloading and increasing her diet. 11/07/2015 -- she has not been here to see [place] for 2 weeks and at the present time continues to try and eat better, work on off loading and is using the air mattress at night. She saw her PCP yesterday and she has put her back on a fentanyl patch. 11/21/2015 -- for some strange reason she has been sitting in a chair with her legs dependent for the last 6 days nonstop and has developed massive lower extremity edema and pedal edema with weeping and ulceration. Fun[place], Kazi H. ([id number]) Objective Constitutional Pulse regular. Respirations normal and unlabored. Afebrile. Vitals Time Taken: 3:45 PM, Height: 65 in, Weight: 100 lbs, BMI: 16.6, Temperature: 98.4 F, Pulse: 100 bpm, Respiratory Rate: 18 breaths/min, Blood Pressure: 130/61 mmHg. Eyes Nonicteric. Reactive to light. Ears, Nose, Mouth, and Throat Lips, teeth, and gums WNL.Marland Kitchen Moist mucosa without lesions. Neck supple and nontender. No palpable supraclavicular or cervical adenopathy. Normal sized without goiter. Respiratory WNL. No retractions.. Cardiovascular Pedal Pulses WNL. No clubbing, cyanosis or edema. Lymphatic No adneopathy. No adenopathy. No adenopathy. Musculoskeletal Adexa without tenderness or enlargement.. Digits and nails w/o clubbing, cyanosis, infection, petechiae, ischemia, or inflammatory conditions.Marland Kitchen Psychiatric Judgement and insight Intact.. No evidence of depression, anxiety, or agitation.. General Notes: She has made an excellent recovery compared to last week and the massive lymphedema of both lower extremities as gone down markedly. Few of the wounds have significant amount of slough which cannot be removed with a curette as she is too tender to sharply and  debride this.we will use Santyl for these wounds. The rest of them will covered with silver alginate Integumentary (Hair, Skin) No suspicious lesions. No crepitus or fluctuance. No peri-wound warmth or erythema. No masses.. Wound #10 status is Open. Original cause of wound was Gradually Appeared. The wound is located on the [place], [place] ([id number]) Right,Medial Foot. The wound measures 0.3cm length x 0.4cm width x 0.1cm depth; 0.094cm^2 area and 0.009cm^3 volume. The wound is limited to skin breakdown. There is no tunneling or undermining noted. There is a medium amount of serous drainage noted. The wound margin is flat and intact. There is no granulation within the wound bed. There is a large (67-100%) amount of necrotic tissue within the wound bed including Adherent Slough. The periwound skin appearance exhibited: Moist, Erythema. The periwound skin appearance did not exhibit: Callus, Crepitus, Excoriation, Fluctuance, Friable, Induration, Localized Edema, Rash, Scarring, Dry/Scaly. The surrounding wound skin color is noted with erythema which is circumferential. Periwound temperature was noted as No Abnormality. The periwound has tenderness on palpation. Wound #11 status is Open. Original cause of wound was Pressure Injury. The wound is located on the Right,Distal Calcaneus. The wound measures 1.8cm length x 1.8cm width x 0.2cm depth; 2.545cm^2 area and 0.509cm^3 volume. The wound is limited to skin breakdown. There is no tunneling or undermining noted. There is a large amount of serous drainage noted. The wound  margin is flat and intact. There is small (1-33%) pink granulation within the wound bed. There is a large (67-100%) amount of necrotic tissue within the wound bed including Adherent Slough. The periwound skin appearance exhibited: Maceration, Moist. The periwound skin appearance did not exhibit: Callus, Crepitus, Excoriation, Fluctuance, Friable, Induration, Localized Edema, Rash,  Scarring, Dry/Scaly, Atrophie Blanche, Cyanosis, Ecchymosis, Hemosiderin Staining, Mottled, Pallor, Rubor, Erythema. Periwound temperature was noted as No Abnormality. The periwound has tenderness on palpation. Wound #13 status is Open. Original cause of wound was Pressure Injury. The wound is located on the Left Calcaneus. The wound measures 0.4cm length x 0.4cm width x 0.1cm depth; 0.126cm^2 area and 0.013cm^3 volume. The wound is limited to skin breakdown. There is no tunneling or undermining noted. There is a large amount of serous drainage noted. The wound margin is flat and intact. There is medium (34-66%) red, pink granulation within the wound bed. There is a medium (34-66%) amount of necrotic tissue within the wound bed including Adherent Slough. The periwound skin appearance exhibited: Maceration, Moist. Periwound temperature was noted as No Abnormality. The periwound has tenderness on palpation. Wound #14 status is Healed - Epithelialized. Original cause of wound was Blister. The wound is located on the Right,Anterior Lower Leg. The wound measures 0cm length x 0cm width x 0cm depth; 0cm^2 area and 0cm^3 volume. Wound #3 status is Open. Original cause of wound was Pressure Injury. The wound is located on the Midline Back. The wound measures 2cm length x 2cm width x 0.2cm depth; 3.142cm^2 area and 0.628cm^3 volume. The wound is limited to skin breakdown. There is no tunneling or undermining noted. There is a large amount of serosanguineous drainage noted. The wound margin is distinct with the outline attached to the wound base. There is medium (34-66%) red, pink granulation within the wound bed. There is a medium (34-66%) amount of necrotic tissue within the wound bed including Adherent Slough. The periwound skin appearance exhibited: Moist. The periwound skin appearance did not exhibit: Callus, Crepitus, Excoriation, Fluctuance, Friable, Induration, Localized Edema, Rash, Scarring,  Dry/Scaly, Maceration, Atrophie Blanche, Cyanosis, Ecchymosis, Hemosiderin Staining, Mottled, Pallor, Rubor, Erythema. Periwound temperature was noted as No Abnormality. Wound #7 status is Open. Original cause of wound was Pressure Injury. The wound is located on the Right Ischial Tuberosity. The wound measures 1.1cm length x 0.5cm width x 0.2cm depth; 0.432cm^2 area and 0.086cm^3 volume. The wound is limited to skin breakdown. There is no tunneling or undermining noted. There is a medium amount of serous drainage noted. The wound margin is distinct with the outline attached to the wound base. There is no granulation within the wound bed. There is a large (67-100%) amount of necrotic tissue within the wound bed including Eschar. The periwound skin appearance exhibited: Moist. Monrreal, Lore H. ([id number]) The periwound skin appearance did not exhibit: Callus, Crepitus, Excoriation, Fluctuance, Friable, Induration, Localized Edema, Rash, Scarring, Dry/Scaly, Maceration, Atrophie Blanche, Cyanosis, Ecchymosis, Hemosiderin Staining, Mottled, Pallor, Rubor, Erythema. Periwound temperature was noted as No Abnormality. The periwound has tenderness on palpation. Wound #8 status is Open. Original cause of wound was Gradually Appeared. The wound is located on the Left Malleolus. The wound measures 1.1cm length x 1.1cm width x 0.2cm depth; 0.95cm^2 area and 0.19cm^3 volume. The wound is limited to skin breakdown. There is no tunneling or undermining noted. There is a medium amount of serous drainage noted. The wound margin is flat and intact. There is medium (34-66%) granulation within the wound bed. There is a medium (  34-66%) amount of necrotic tissue within the wound bed including Adherent Slough. The periwound skin appearance exhibited: Moist, Erythema. The periwound skin appearance did not exhibit: Callus, Crepitus, Excoriation, Fluctuance, Friable, Induration, Localized Edema, Rash, Scarring,  Dry/Scaly, Maceration, Atrophie Blanche, Cyanosis, Ecchymosis, Hemosiderin Staining, Mottled, Pallor, Rubor. The surrounding wound skin color is noted with erythema which is circumferential. Periwound temperature was noted as No Abnormality. The periwound has tenderness on palpation. Wound #9 status is Open. Original cause of wound was Gradually Appeared. The wound is located on the Left Ischial Tuberosity. The wound measures 0.2cm length x 0.2cm width x 0.1cm depth; 0.031cm^2 area and 0.003cm^3 volume. The wound is limited to skin breakdown. There is no tunneling or undermining noted. There is a medium amount of serous drainage noted. The wound margin is flat and intact. There is no granulation within the wound bed. There is a large (67-100%) amount of necrotic tissue within the wound bed including Adherent Slough. The periwound skin appearance exhibited: Moist. The periwound skin appearance did not exhibit: Callus, Crepitus, Excoriation, Fluctuance, Friable, Induration, Localized Edema, Rash, Scarring, Dry/Scaly, Maceration, Atrophie Blanche, Cyanosis, Ecchymosis, Hemosiderin Staining, Mottled, Pallor, Rubor, Erythema. Periwound temperature was noted as No Abnormality. Assessment Active Problems ICD-10 E11.621 - Type 2 diabetes mellitus with foot ulcer L89.613 - Pressure ulcer of right heel, stage 3 F17.218 - Nicotine dependence, cigarettes, with other nicotine-induced disorders L89.100 - Pressure ulcer of unspecified part of back, unstageable L89.213 - Pressure ulcer of right hip, stage 3 I89.0 - Lymphedema, not elsewhere classified L97.211 - Non-pressure chronic ulcer of right calf limited to breakdown of skin Plan [place], Brunette H. ([id number]) Wound Cleansing: Wound #10 Right,Medial Foot: Clean wound with Normal Saline. Cleanse wound with mild soap and water Wound #11 Right,Distal Calcaneus: Clean wound with Normal Saline. Cleanse wound with mild soap and water Wound #13 Left  Calcaneus: Clean wound with Normal Saline. Cleanse wound with mild soap and water Wound #3 Midline Back: Clean wound with Normal Saline. Cleanse wound with mild soap and water Wound #7 Right Ischial Tuberosity: Clean wound with Normal Saline. Cleanse wound with mild soap and water Wound #8 Left Malleolus: Clean wound with Normal Saline. Cleanse wound with mild soap and water Wound #9 Left Ischial Tuberosity: Clean wound with Normal Saline. Cleanse wound with mild soap and water Anesthetic: Wound #10 Right,Medial Foot: Topical Lidocaine 4% cream applied to wound bed prior to debridement Wound #11 Right,Distal Calcaneus: Topical Lidocaine 4% cream applied to wound bed prior to debridement Wound #13 Left Calcaneus: Topical Lidocaine 4% cream applied to wound bed prior to debridement Wound #3 Midline Back: Topical Lidocaine 4% cream applied to wound bed prior to debridement Wound #7 Right Ischial Tuberosity: Topical Lidocaine 4% cream applied to wound bed prior to debridement Wound #8 Left Malleolus: Topical Lidocaine 4% cream applied to wound bed prior to debridement Wound #9 Left Ischial Tuberosity: Topical Lidocaine 4% cream applied to wound bed prior to debridement Skin Barriers/Peri-Wound Care: Wound #10 Right,Medial Foot: Skin Prep Wound #11 Right,Distal Calcaneus: Skin Prep Wound #13 Left Calcaneus: Skin Prep Wound #3 Midline Back: Skin Prep Wound #7 Right Ischial Tuberosity: Skin Prep Wound #8 Left Malleolus: Skin Prep Mccamish, Bitania H. ([id number]) Wound #9 Left Ischial Tuberosity: Skin Prep Primary Wound Dressing: Wound #11 Right,Distal Calcaneus: Santyl Ointment Wound #7 Right Ischial Tuberosity: Santyl Ointment Wound #8 Left Malleolus: Santyl Ointment Wound #10 Right,Medial Foot: Santyl Ointment Wound #13 Left Calcaneus: Santyl Ointment Wound #3 Midline Back: Santyl Ointment Wound #9 Left Ischial  Tuberosity: Santyl Ointment Secondary Dressing: Wound  #10 Right,Medial Foot: Boardered Foam Dressing Wound #11 Right,Distal Calcaneus: Boardered Foam Dressing Wound #13 Left Calcaneus: Boardered Foam Dressing Wound #3 Midline Back: Boardered Foam Dressing Wound #7 Right Ischial Tuberosity: Boardered Foam Dressing Wound #8 Left Malleolus: Boardered Foam Dressing Wound #9 Left Ischial Tuberosity: Boardered Foam Dressing Dressing Change Frequency: Wound #10 Right,Medial Foot: Change dressing every day. Wound #11 Right,Distal Calcaneus: Change dressing every day. Wound #13 Left Calcaneus: Change dressing every day. Wound #3 Midline Back: Change dressing every day. Wound #7 Right Ischial Tuberosity: Change dressing every day. Wound #8 Left Malleolus: Change dressing every day. Wound #9 Left Ischial Tuberosity: Change dressing every day. Follow-up Appointments: Wound #10 Right,Medial Foot: Return Appointment in 2 weeks. Wound #11 Right,Distal Calcaneus: Reasoner, Kileigh H. ([id number]) Return Appointment in 2 weeks. Wound #13 Left Calcaneus: Return Appointment in 2 weeks. Wound #3 Midline Back: Return Appointment in 2 weeks. Wound #7 Right Ischial Tuberosity: Return Appointment in 2 weeks. Wound #8 Left Malleolus: Return Appointment in 2 weeks. Wound #9 Left Ischial Tuberosity: Return Appointment in 2 weeks. Edema Control: Elevate legs to the level of the heart and pump ankles as often as possible Off-Loading: Wound #10 Right,Medial Foot: Turn and reposition every 2 hours Other: - float heels while in the bed Wound #11 Right,Distal Calcaneus: Turn and reposition every 2 hours Other: - float heels while in the bed Wound #13 Left Calcaneus: Turn and reposition every 2 hours Other: - float heels while in the bed Wound #3 Midline Back: Turn and reposition every 2 hours Other: - float heels while in the bed Wound #7 Right Ischial Tuberosity: Turn and reposition every 2 hours Other: - float heels while in the bed Wound #8  Left Malleolus: Turn and reposition every 2 hours Other: - float heels while in the bed Wound #9 Left Ischial Tuberosity: Turn and reposition every 2 hours Other: - float heels while in the bed Home Health: Wound #10 Right,Medial Foot: [place] Visits - [facility] Nurse may visit PRN to address patient s wound care needs. FACE TO FACE ENCOUNTER: MEDICARE and MEDICAID PATIENTS: I certify that this patient is under my care and that I had a face-to-face encounter that meets the physician face-to-face encounter requirements with this patient on this date. The encounter with the patient was in whole or in part for the following MEDICAL CONDITION: (primary reason for [place]) MEDICAL NECESSITY: I certify, that based on my findings, NURSING services are a medically necessary home health service. HOME BOUND STATUS: I certify that my clinical findings support that this patient is homebound (i.e., Due to illness or injury, pt requires aid of supportive devices such as crutches, cane, wheelchairs, walkers, the use of special transportation or the assistance of another person to leave their place of residence. There is a normal inability to leave the home and doing so requires considerable and taxing effort. Other absences are for medical reasons / religious services and are infrequent or of short duration when for other reasons). If current dressing causes regression in wound condition, may D/C ordered dressing product/s and apply Normal Saline Moist Dressing daily until next [place] / Other MD appointment. Notify Wound Callan, Suzane H. ([id number]) [place] of regression in wound condition at [phone]. Please direct any NON-WOUND related issues/requests for orders to patient's Primary Care Physician Wound #11 Right,Distal Calcaneus: [employer] Visits - [facility] Nurse may visit PRN to address patient  s wound care needs. FACE TO  FACE ENCOUNTER: MEDICARE and MEDICAID PATIENTS: I certify that this patient is under my care and that I had a face-to-face encounter that meets the physician face-to-face encounter requirements with this patient on this date. The encounter with the patient was in whole or in part for the following MEDICAL CONDITION: (primary reason for [place]) MEDICAL NECESSITY: I certify, that based on my findings, NURSING services are a medically necessary home health service. HOME BOUND STATUS: I certify that my clinical findings support that this patient is homebound (i.e., Due to illness or injury, pt requires aid of supportive devices such as crutches, cane, wheelchairs, walkers, the use of special transportation or the assistance of another person to leave their place of residence. There is a normal inability to leave the home and doing so requires considerable and taxing effort. Other absences are for medical reasons / religious services and are infrequent or of short duration when for other reasons). If current dressing causes regression in wound condition, may D/C ordered dressing product/s and apply Normal Saline Moist Dressing daily until next [place] / Other MD appointment. [place] of regression in wound condition at [phone]. Please direct any NON-WOUND related issues/requests for orders to patient's Primary Care Physician Wound #13 Left Calcaneus: [place] Visits - [facility] Nurse may visit PRN to address patient s wound care needs. FACE TO FACE ENCOUNTER: MEDICARE and MEDICAID PATIENTS: I certify that this patient is under my care and that I had a face-to-face encounter that meets the physician face-to-face encounter requirements with this patient on this date. The encounter with the patient was in whole or in part for the following MEDICAL CONDITION: (primary reason for [place]) MEDICAL NECESSITY: I certify, that based  on my findings, NURSING services are a medically necessary home health service. HOME BOUND STATUS: I certify that my clinical findings support that this patient is homebound (i.e., Due to illness or injury, pt requires aid of supportive devices such as crutches, cane, wheelchairs, walkers, the use of special transportation or the assistance of another person to leave their place of residence. There is a normal inability to leave the home and doing so requires considerable and taxing effort. Other absences are for medical reasons / religious services and are infrequent or of short duration when for other reasons). If current dressing causes regression in wound condition, may D/C ordered dressing product/s and apply Normal Saline Moist Dressing daily until next [place] / Other MD appointment. [place] of regression in wound condition at [phone]. Please direct any NON-WOUND related issues/requests for orders to patient's Primary Care Physician Wound #3 Midline Back: [place] Visits - [facility] Nurse may visit PRN to address patient s wound care needs. FACE TO FACE ENCOUNTER: MEDICARE and MEDICAID PATIENTS: I certify that this patient is under my care and that I had a face-to-face encounter that meets the physician face-to-face encounter requirements with this patient on this date. The encounter with the patient was in whole or in part for the following MEDICAL CONDITION: (primary reason for [place]) MEDICAL NECESSITY: I certify, that based on my findings, NURSING services are a medically necessary home health service. HOME BOUND STATUS: I certify that my clinical findings support that this patient is homebound (i.e., Due to illness or injury, pt requires aid of supportive devices such as crutches, cane, wheelchairs, walkers, the use of special transportation or the  assistance of another person to leave their place of residence. There  is a normal inability to leave the home and doing so requires considerable and taxing effort. Other absences are for medical reasons / religious services and are infrequent or of short duration when for other reasons). If current dressing causes regression in wound condition, may D/C ordered dressing product/s and apply Normal Saline Moist Dressing daily until next [place] / Other MD appointment. Notify Wound Edelman, Maddyx H. ([id number]) [place] of regression in wound condition at [phone]. Please direct any NON-WOUND related issues/requests for orders to patient's Primary Care Physician Wound #7 Right Ischial Tuberosity: [place] Visits - [facility] Nurse may visit PRN to address patient s wound care needs. FACE TO FACE ENCOUNTER: MEDICARE and MEDICAID PATIENTS: I certify that this patient is under my care and that I had a face-to-face encounter that meets the physician face-to-face encounter requirements with this patient on this date. The encounter with the patient was in whole or in part for the following MEDICAL CONDITION: (primary reason for [place]) MEDICAL NECESSITY: I certify, that based on my findings, NURSING services are a medically necessary home health service. HOME BOUND STATUS: I certify that my clinical findings support that this patient is homebound (i.e., Due to illness or injury, pt requires aid of supportive devices such as crutches, cane, wheelchairs, walkers, the use of special transportation or the assistance of another person to leave their place of residence. There is a normal inability to leave the home and doing so requires considerable and taxing effort. Other absences are for medical reasons / religious services and are infrequent or of short duration when for other reasons). If current dressing causes regression in wound condition, may D/C ordered dressing product/s and apply Normal Saline Moist Dressing daily  until next [place] / Other MD appointment. [place] of regression in wound condition at [phone]. Please direct any NON-WOUND related issues/requests for orders to patient's Primary Care Physician Wound #8 Left Malleolus: [place] Visits - [facility] Nurse may visit PRN to address patient s wound care needs. FACE TO FACE ENCOUNTER: MEDICARE and MEDICAID PATIENTS: I certify that this patient is under my care and that I had a face-to-face encounter that meets the physician face-to-face encounter requirements with this patient on this date. The encounter with the patient was in whole or in part for the following MEDICAL CONDITION: (primary reason for [place]) MEDICAL NECESSITY: I certify, that based on my findings, NURSING services are a medically necessary home health service. HOME BOUND STATUS: I certify that my clinical findings support that this patient is homebound (i.e., Due to illness or injury, pt requires aid of supportive devices such as crutches, cane, wheelchairs, walkers, the use of special transportation or the assistance of another person to leave their place of residence. There is a normal inability to leave the home and doing so requires considerable and taxing effort. Other absences are for medical reasons / religious services and are infrequent or of short duration when for other reasons). If current dressing causes regression in wound condition, may D/C ordered dressing product/s and apply Normal Saline Moist Dressing daily until next [place] / Other MD appointment. [place] of regression in wound condition at [phone]. Please direct any NON-WOUND related issues/requests for orders to patient's Primary Care Physician Wound #9 Left Ischial Tuberosity: [place] Visits - [facility] Nurse [phone]  visit PRN to address patient s wound care needs. FACE TO FACE  ENCOUNTER: MEDICARE and MEDICAID PATIENTS: I certify that this patient is under my care and that I had a face-to-face encounter that meets the physician face-to-face encounter requirements with this patient on this date. The encounter with the patient was in whole or in part for the following MEDICAL CONDITION: (primary reason for [place]) MEDICAL NECESSITY: I certify, that based on my findings, NURSING services are a medically necessary home health service. HOME BOUND STATUS: I certify that my clinical findings support that this patient is homebound (i.e., Due to illness or injury, pt requires aid of supportive devices such as crutches, cane, wheelchairs, walkers, the use of special transportation or the assistance of another person to leave their place of residence. There is a normal inability to leave the home and doing so requires considerable and taxing effort. Other absences are for medical reasons / religious services and are infrequent or of short duration when for other reasons). If current dressing causes regression in wound condition, may D/C ordered dressing product/s and apply Normal Saline Moist Dressing daily until next [place] / Other MD appointment. Notify Wound Sammon, Carmelina H. ([id number]) [place] of regression in wound condition at [phone]. Please direct any NON-WOUND related issues/requests for orders to patient's Primary Care Physician She has made an excellent recovery compared to last week and the massive lymphedema of both lower extremities as gone down markedly. Few of the wounds have significant amount of slough which cannot be removed with a curette as she is too tender to sharply and debride this.we will use Santyl for these wounds. The rest of them will covered with silver alginate. I have also asked her to continue to offload as well as possible. She has been urged to elevate her limbs all through the day and night. We have again  discussed offloading, vitamin supplements and increase protein and she is agreeable to use this. We will see her back next week. Electronic Signature(s) Signed: 11/28/2015 4:43:17 PM By: Christin Fudge MD, FACS Previous Signature: 11/28/2015 4:42:39 PM Version By: Christin Fudge MD, FACS Previous Signature: 11/28/2015 4:25:16 PM Version By: Christin Fudge MD, FACS Entered By: Christin Fudge on 11/28/2015 16:43:17 [place], Renetta H. ([id number]) -------------------------------------------------------------------------------- SuperBill Details Patient Name: [name], Taniaya H. Date of Service: 11/28/2015 Medical Record Number: [id number] Patient Account Number: [id number] Date of Birth/Sex: 07/24/1946 (69 y.o. 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, stage 3 I89.0 Lymphedema, not elsewhere classified L97.211 Non-pressure chronic ulcer of right calf limited to breakdown of skin Facility Procedures CPT4 Code: [id number] Description: [id number] - WOUND CARE VISIT-LEV 5 EST PT Modifier: Quantity: 1 Physician Procedures CPT4 Code: [id number] Description: 99213 - WC PHYS LEVEL 3 - EST PT ICD-10 Description Diagnosis 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

## 2015-12-05 ENCOUNTER — Ambulatory Visit: Payer: Medicare Other | Admitting: General Surgery

## 2015-12-12 ENCOUNTER — Encounter: Payer: Medicare Other | Admitting: Surgery

## 2015-12-12 DIAGNOSIS — E11621 Type 2 diabetes mellitus with foot ulcer: Secondary | ICD-10-CM | POA: Diagnosis not present

## 2015-12-17 NOTE — Progress Notes (Signed)
CARSEY, Ares H. ([id number]) Visit Report for 12/12/2015 Arrival Information Details Patient Name: SERNA, Sherley H. Date of Service: 12/12/2015 3:30 PM Medical Record Number: [id number] Patient Account Number: [id number] Date of Birth/Sex: [birth year] (70 y.o. Female) Treating RN: Carolyne Fiscal, Debi Primary Care Physician: Paulita Cradle Other Clinician: Referring Physician: Paulita Cradle Treating Physician/Extender: Frann Rider in Treatment: 69 Visit Information History Since Last Visit All ordered tests and consults were completed: No Patient Arrived: Walker Added or deleted any medications: No Arrival Time: 15:33 Any new allergies or adverse reactions: No Accompanied By: husband Had a fall or experienced change in No Transfer Assistance: EasyPivot activities of daily living that may affect Patient Lift risk of falls: Patient Identification Verified: Yes Signs or symptoms of abuse/neglect since last No Secondary Verification Process Yes visito Completed: Hospitalized since last visit: No Patient Requires Transmission- No Pain Present Now: No Based Precautions: Patient Has Alerts: No Electronic Signature(s) Signed: 12/16/2015 4:28:40 PM By: Alric Quan Entered By: Alric Quan on 12/12/2015 15:36:10 Machamer, Kashara H. ([id number]) -------------------------------------------------------------------------------- Clinic Level of Care Assessment Details Patient Name: Galeno, Tomeca H. Date of Service: 12/12/2015 3:30 PM Medical Record Number: [id number] Patient Account Number: [id number] Date of Birth/Sex: 11/23/1945 (70 y.o. Female) Treating RN: Carolyne Fiscal, Debi Primary Care Physician: Paulita Cradle Other Clinician: Referring Physician: Paulita Cradle Treating Physician/Extender: Frann Rider in Treatment: 39 Clinic Level of Care Assessment Items TOOL 4 Quantity Score []  - Use when only an EandM is performed on FOLLOW-UP visit 0 ASSESSMENTS -  Nursing Assessment / Reassessment []  - Reassessment of Co-morbidities (includes updates in patient status) 0 X - Reassessment of Adherence to Treatment Plan 1 5 ASSESSMENTS - Wound and Skin Assessment / Reassessment []  - Simple Wound Assessment / Reassessment - one wound 0 X - Complex Wound Assessment / Reassessment - multiple wounds 7 5 []  - Dermatologic / Skin Assessment (not related to wound area) 0 ASSESSMENTS - Focused Assessment []  - Circumferential Edema Measurements - multi extremities 0 []  - Nutritional Assessment / Counseling / Intervention 0 []  - Lower Extremity Assessment (monofilament, tuning fork, pulses) 0 []  - Peripheral Arterial Disease Assessment (using hand held doppler) 0 ASSESSMENTS - Ostomy and/or Continence Assessment and Care []  - Incontinence Assessment and Management 0 []  - Ostomy Care Assessment and Management (repouching, etc.) 0 PROCESS - Coordination of Care []  - Simple Patient / Family Education for ongoing care 0 X - Complex (extensive) Patient / Family Education for ongoing care 1 20 []  - Staff obtains Programmer, systems, Records, Test Results / Process Orders 0 X - Staff telephones HHA, Nursing Homes / Clarify orders / etc 1 10 []  - Routine Transfer to another Facility (non-emergent condition) 0 Arambula, Charlie H. ([id number]) []  - Routine Hospital Admission (non-emergent condition) 0 []  - New Admissions / Biomedical engineer / Ordering NPWT, Apligraf, etc. 0 []  - Emergency Hospital Admission (emergent condition) 0 X - Simple Discharge Coordination 1 10 []  - Complex (extensive) Discharge Coordination 0 PROCESS - Special Needs []  - Pediatric / Minor Patient Management 0 []  - Isolation Patient Management 0 []  - Hearing / Language / Visual special needs 0 []  - Assessment of Community assistance (transportation, D/C planning, etc.) 0 []  - Additional assistance / Altered mentation 0 []  - Support Surface(s) Assessment (bed, cushion, seat, etc.) 0 INTERVENTIONS -  Wound Cleansing / Measurement []  - Simple Wound Cleansing - one wound 0 X - Complex Wound Cleansing - multiple wounds 7 5 X - Wound Imaging (photographs - any number of wounds) 1  5 []  - Wound Tracing (instead of photographs) 0 []  - Simple Wound Measurement - one wound 0 X - Complex Wound Measurement - multiple wounds 7 5 INTERVENTIONS - Wound Dressings X - Small Wound Dressing one or multiple wounds 6 10 X - Medium Wound Dressing one or multiple wounds 1 15 []  - Large Wound Dressing one or multiple wounds 0 X - Application of Medications - topical 1 5 []  - Application of Medications - injection 0 INTERVENTIONS - Miscellaneous []  - External ear exam 0 Hatton, Tayanna H. ([id number]) []  - Specimen Collection (cultures, biopsies, blood, body fluids, etc.) 0 []  - Specimen(s) / Culture(s) sent or taken to Lab for analysis 0 []  - Patient Transfer (multiple staff / Civil Service fast streamer / Similar devices) 0 []  - Simple Staple / Suture removal (25 or less) 0 []  - Complex Staple / Suture removal (26 or more) 0 []  - Hypo / Hyperglycemic Management (close monitor of Blood Glucose) 0 []  - Ankle / Brachial Index (ABI) - do not check if billed separately 0 X - Vital Signs 1 5 Has the patient been seen at the hospital within the last three years: Yes Total Score: 240 Level Of Care: New/Established - Level 5 Electronic Signature(s) Signed: 12/16/2015 4:28:40 PM By: Alric Quan Entered By: Alric Quan on 12/12/2015 16:56:15 Garris, Britania H. ([id number]) -------------------------------------------------------------------------------- Encounter Discharge Information Details Patient Name: [name], Rhonda H. Date of Service: 12/12/2015 3:30 PM Medical Record Number: [id number] Patient Account Number: [id number] Date of Birth/Sex: Mar 22, 1946 (70 y.o. Female) Treating RN: Montey Hora Primary Care Physician: Paulita Cradle Other Clinician: Referring Physician: Paulita Cradle Treating Physician/Extender:  Frann Rider in Treatment: 36 Encounter Discharge Information Items Discharge Pain Level: 0 Discharge Condition: Stable Ambulatory Status: Walker Discharge Destination: Home Transportation: Private Auto Accompanied By: husband Schedule Follow-up Appointment: Yes Medication Reconciliation completed Yes and provided to Patient/Care Archer Moist: Provided on Clinical Summary of Care: 12/12/2015 Form Type Recipient Paper Patient AS Electronic Signature(s) Signed: 12/16/2015 4:28:40 PM By: Alric Quan Previous Signature: 12/12/2015 4:47:38 PM Version By: Montey Hora Entered By: Alric Quan on 12/12/2015 16:59:18 Coletta, Erial H. ([id number]) -------------------------------------------------------------------------------- Lower Extremity Assessment Details Patient Name: Fare, Kainat H. Date of Service: 12/12/2015 3:30 PM Medical Record Number: [id number] Patient Account Number: [id number] Date of Birth/Sex: Feb 01, 1946 (70 y.o. Female) Treating RN: Carolyne Fiscal, Debi Primary Care Physician: Paulita Cradle Other Clinician: Referring Physician: Paulita Cradle Treating Physician/Extender: Frann Rider in Treatment: 39 Vascular Assessment Pulses: Posterior Tibial Dorsalis Pedis Palpable: [Left:Yes] [Right:Yes] Extremity colors, hair growth, and conditions: Extremity Color: [Left:Mottled] [Right:Mottled] Temperature of Extremity: [Left:Warm] [Right:Warm] Capillary Refill: [Left:< 3 seconds] [Right:< 3 seconds] Toe Nail Assessment Left: Right: Thick: No No Discolored: No No Deformed: No No Improper Length and Hygiene: No No Electronic Signature(s) Signed: 12/16/2015 4:28:40 PM By: Alric Quan Entered By: Alric Quan on 12/12/2015 15:42:04 Schmutz, Lauranne H. ([id number]) -------------------------------------------------------------------------------- Multi Wound Chart Details Patient Name: Heyward, Callaway H. Date of Service: 12/12/2015 3:30 PM Medical  Record Number: [id number] Patient Account Number: [id number] Date of Birth/Sex: 02-04-1946 (70 y.o. 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] [11:Pressure Ulcer] [  13:Pressure Ulcer] Comorbid History: [10:Cataracts, Asthma, Hypertension, Type II Diabetes, Neuropathy, Received Chemotherapy] [11:Cataracts, Asthma, Hypertension, Type II Diabetes, Neuropathy, Received Chemotherapy] [13:Cataracts, Asthma, Hypertension, Type II Diabetes,  Neuropathy, Received Chemotherapy] Date Acquired: [10:09/12/2015] [11:09/29/2015] [13:11/02/2015] Weeks of Treatment: [10:12] [11:10] [13:5] Wound Status: [10:Open] [11:Open] [13:Open] Measurements L x W x D 0.5x0.5x0.4 [11:1.8x1.7x0.1] [13:0.1x0.1x0.1] (cm) Area (cm) : [10:0.196] [11:2.403] [13:0.008] Volume (cm) : [10:0.079] [11:0.24] [13:0.001] % Reduction in Area: [10:16.90%] [11:-239.90%] [13:99.40%] % Reduction in Volume: -229.20% [11:-238.00%] [13:99.30%] Classification: [10:Category/Stage II] [11:Category/Stage II] [13:Category/Stage II] HBO Classification: [10:Grade 1] [11:Grade 1] [13:Grade 1] Exudate Amount: [10:Medium] [11:Large] [13:Large] Exudate Type: [10:Serous] [11:Serous] [13:Serous] Exudate Color: [10:amber] [11:amber] [13:amber] Wound Margin: [10:Flat and Intact] [11:Flat and Intact] [13:Flat and Intact] Granulation Amount: [10:Medium  (34-66%)] [11:Small (1-33%)] [13:None Present (0%)] Granulation Quality: [10:Pink] [11:Pink] [13:N/A] Necrotic Amount: [10:Medium (34-66%)] [11:Large (67-100%)] [13:Large (67-100%)] Necrotic Tissue: [10:Adherent Slough] [11:Adherent Slough] [13:Eschar, Adherent Slough] Exposed Structures: [10:Fascia: No Fat: No Tendon: No] [11:Fascia: No Fat: No Tendon: No] [13:Fascia: No Fat: No Tendon: No] Muscle: No Muscle: No Muscle: No Joint: No Joint: No Joint: No Bone: No Bone: No Bone: No Limited to Skin Limited to Skin Limited to Skin Breakdown Breakdown Breakdown Epithelialization: None None None Periwound Skin Texture: Edema: No Edema: No No Abnormalities Noted Excoriation: No Excoriation: No Induration: No Induration: No Callus: No Callus: No Crepitus: No Crepitus: No Fluctuance: No Fluctuance: No Friable: No Friable: No Rash: No Rash: No Scarring: No Scarring: No Periwound Skin Moist: Yes Maceration: Yes Moist: Yes Moisture: Dry/Scaly: No Moist: Yes Maceration: No Dry/Scaly: No Periwound Skin Color: Erythema: Yes Atrophie Blanche: No No Abnormalities Noted Cyanosis: No Ecchymosis: No Erythema: No Hemosiderin Staining: No Mottled: No Pallor: No Rubor: No Erythema Location: Circumferential N/A N/A Temperature: No Abnormality No Abnormality No Abnormality Tenderness on Yes Yes Yes Palpation: Wound Preparation: Ulcer Cleansing: Ulcer Cleansing: Ulcer Cleansing: Rinsed/Irrigated with Rinsed/Irrigated with Rinsed/Irrigated with Saline Saline Saline Topical Anesthetic Topical Anesthetic Topical Anesthetic Applied: Other: lidocaine Applied: Other: lidocaine Applied: Other: lidocaine 4% 4% 4% Wound Number: [id number]  Photos: No Photos No Photos No Photos Wound Location: Back - Midline Right Ischial Tuberosity Left Malleolus Wounding Event: Pressure Injury Pressure Injury Gradually Appeared Primary Etiology: Pressure Ulcer Pressure Ulcer Pressure Ulcer Comorbid  History: Cataracts, Asthma, Cataracts, Asthma, Cataracts, Asthma, Hypertension, Type II Hypertension, Type II Hypertension, Type II Diabetes, Neuropathy, Diabetes, Neuropathy, Diabetes, Neuropathy, Received Chemotherapy Received Chemotherapy Received Chemotherapy Date Acquired: 05/02/2015 08/01/2015 09/12/2015 Weeks of Treatment: 32 18 12 Wound Status: Open Open Open Soroka, Chestine H. ([id number]) Measurements L x W x D 2x1.5x2 0.8x0.4x0.7 1x0.9x0.1 (cm) Area (cm) : 2.356 0.251 0.707 Volume (cm) : 4.712 0.176 0.071 % Reduction in Area: -970.90% 92.00% -1404.30% % Reduction in Volume: -21318.20% 43.90% -688.90% Classification: Category/Stage II Category/Stage III Category/Stage II HBO Classification: N/A N/A Grade 0 Exudate Amount: Large Large Large Exudate Type: Serosanguineous Serous Serous Exudate Color: red, brown amber amber Wound Margin: Distinct, outline attached Distinct, outline attached Flat and Intact Granulation Amount: Medium (34-66%) None Present (0%) Medium (34-66%) Granulation Quality: Red, Pink N/A N/A Necrotic Amount: Medium (34-66%) Large (67-100%) Medium (34-66%) Necrotic Tissue: Adherent [place] Exposed Structures: Fascia: No Fascia: No Fascia: No Fat: No Fat: No Fat: No Tendon: No Tendon: No Tendon: No Muscle: No Muscle: No Muscle: No Joint: No Joint: No Joint: No Bone: No Bone: No Bone: No Limited to Skin Limited to Skin Limited to Skin Breakdown Breakdown Breakdown Epithelialization: None None None Periwound Skin  Texture: Edema: No Edema: No Edema: No Excoriation: No Excoriation: No Excoriation: No Induration: No Induration: No Induration: No Callus: No Callus: No Callus: No Crepitus: No Crepitus: No Crepitus: No Fluctuance: No Fluctuance: No Fluctuance: No Friable: No Friable: No Friable: No Rash: No Rash: No Rash: No Scarring: No Scarring: No Scarring: No Periwound Skin Moist: Yes Moist:  Yes Moist: Yes Moisture: Maceration: No Maceration: No Maceration: No Dry/Scaly: No Dry/Scaly: No Dry/Scaly: No Periwound Skin Color: Atrophie Blanche: No Atrophie Blanche: No Erythema: Yes Cyanosis: No Cyanosis: No Atrophie Blanche: No Ecchymosis: No Ecchymosis: No Cyanosis: No Erythema: No Erythema: No Ecchymosis: No Hemosiderin Staining: No Hemosiderin Staining: No Hemosiderin Staining: No Mottled: No Mottled: No Mottled: No Pallor: No Pallor: No Pallor: No Rubor: No Rubor: No Rubor: No Erythema Location: N/A N/A Circumferential Temperature: No Abnormality No Abnormality No Abnormality Tenderness on No Yes Yes Palpation: Wound Preparation: Sabey, Monika H. ([id number]) Ulcer Cleansing: Ulcer Cleansing: Ulcer Cleansing: Rinsed/Irrigated with Rinsed/Irrigated with Rinsed/Irrigated with Saline Saline Saline Topical Anesthetic Topical Anesthetic Topical Anesthetic Applied: Other: lidocaine Applied: Other: lidocaine Applied: Other: Lidocaine 4% 4% 4% Wound Number: 9 N/A N/A Photos: No Photos N/A N/A Wound Location: Left Ischial Tuberosity N/A N/A Wounding Event: Gradually Appeared N/A N/A Primary Etiology: Pressure Ulcer N/A N/A Comorbid History: Cataracts, Asthma, N/A N/A Hypertension, Type II Diabetes, Neuropathy, Received Chemotherapy Date Acquired: 09/12/2015 N/A N/A Weeks of Treatment: 12 N/A N/A Wound Status: Open N/A N/A Measurements L x W x D 0.5x0.3x0.1 N/A N/A (cm) Area (cm) : 0.118 N/A N/A Volume (cm) : 0.012 N/A N/A % Reduction in Area: 39.80% N/A N/A % Reduction in Volume: 40.00% N/A N/A Classification: Category/Stage II N/A N/A HBO Classification: N/A N/A N/A Exudate Amount: Medium N/A N/A Exudate Type: Serous N/A N/A Exudate Color: amber N/A N/A Wound Margin: Flat and Intact N/A N/A Granulation Amount: None Present (0%) N/A N/A Granulation Quality: N/A N/A N/A Necrotic Amount: Large (67-100%) N/A N/A Necrotic Tissue: Adherent  Slough N/A N/A Exposed Structures: Fascia: No N/A N/A Fat: No Tendon: No Muscle: No Joint: No Bone: No Limited to Skin Breakdown Epithelialization: None N/A N/A Periwound Skin Texture: Edema: No N/A N/A Excoriation: No Induration: No Callus: No Strike, Anica H. ([id number]) Crepitus: No Fluctuance: No Friable: No Rash: No Scarring: No Periwound Skin Moist: Yes N/A N/A Moisture: Maceration: No Dry/Scaly: No Periwound Skin Color: Atrophie Blanche: No N/A N/A Cyanosis: No Ecchymosis: No Erythema: No Hemosiderin Staining: No Mottled: No Pallor: No Rubor: No Erythema Location: N/A N/A N/A Temperature: No Abnormality N/A N/A Tenderness on Yes N/A N/A Palpation: Wound Preparation: Ulcer Cleansing: N/A N/A Rinsed/Irrigated with Saline Topical Anesthetic Applied: Other: lidocaine 4% Treatment Notes Electronic Signature(s) Signed: 12/16/2015 4:28:40 PM By: Alric Quan Entered By: Alric Quan on 12/12/2015 16:04:01 Urbanowicz, Shaleka H. ([id number]) -------------------------------------------------------------------------------- Multi-Disciplinary Care Plan Details Patient Name: [name], Ahlani H. Date of Service: 12/12/2015 3:30 PM Medical Record Number: [id number] Patient Account Number: [id number] Date of Birth/Sex: [birth year] (70 y.o. Female) Treating RN: Carolyne Fiscal, Debi Primary Care Physician: Paulita Cradle Other Clinician: Referring Physician: Paulita Cradle Treating Physician/Extender: Frann Rider in Treatment: 77 Active Inactive Abuse / Safety / Falls / Self Care Management Nursing Diagnoses: Impaired home maintenance Impaired physical mobility Knowledge deficit related to: safety; personal, health (wound), emergency Potential for falls Self care deficit: actual or potential Goals: Patient will remain injury free Date Initiated: 03/13/2015 Goal Status: Active Patient/caregiver will verbalize understanding of skin care regimen Date  Initiated: 03/13/2015 Goal Status: Active Patient/caregiver will  verbalize/demonstrate measure taken to improve self care Date Initiated: 03/13/2015 Goal Status: Active Patient/caregiver will verbalize/demonstrate measures taken to improve the patient's personal safety Date Initiated: 03/13/2015 Goal Status: Active Patient/caregiver will verbalize/demonstrate measures taken to prevent injury and/or falls Date Initiated: 03/13/2015 Goal Status: Active Patient/caregiver will verbalize/demonstrate understanding of what to do in case of emergency Date Initiated: 03/13/2015 Goal Status: Active Interventions: Assess fall risk on admission and as needed Assess: immobility, friction, shearing, incontinence upon admission and as needed Assess impairment of mobility on admission and as needed per policy Assess self care needs on admission and as needed Provide education on basic hygiene Sandall, Elliotte H. ([id number]) Provide education on fall prevention Provide education on personal and home safety Provide education on safe transfers Treatment Activities: Education provided on Basic Hygiene : 03/13/2015 Notes: Orientation to the Wound Care Program Nursing Diagnoses: Knowledge deficit related to the wound healing center program Goals: Patient/caregiver will verbalize understanding of the [place] Date Initiated: 03/13/2015 Goal Status: Active Interventions: Provide education on orientation to the wound center Notes: Pressure Nursing Diagnoses: Knowledge deficit related to causes and risk factors for pressure ulcer development Knowledge deficit related to management of pressures ulcers Potential for impaired tissue integrity related to pressure, friction, moisture, and shear Goals: Patient will remain free from development of additional pressure ulcers Date Initiated: 03/13/2015 Goal Status: Active Patient will remain free of pressure ulcers Date Initiated: 03/13/2015 Goal  Status: Active Patient/caregiver will verbalize risk factors for pressure ulcer development Date Initiated: 03/13/2015 Goal Status: Active Patient/caregiver will verbalize understanding of pressure ulcer management Date Initiated: 03/13/2015 Goal Status: Active Interventions: Assess: immobility, friction, shearing, incontinence upon admission and as needed [place]dorf, Marlies H. ([id number]) Assess offloading mechanisms upon admission and as needed Assess potential for pressure ulcer upon admission and as needed Provide education on pressure ulcers Treatment Activities: Patient referred for home evaluation of offloading devices/mattresses : 12/12/2015 Patient referred for pressure reduction/relief devices : 12/12/2015 Patient referred for seating evaluation to ensure proper offloading : 12/12/2015 Pressure reduction/relief device ordered : 12/12/2015 Test ordered outside of clinic : 12/12/2015 Notes: Wound/Skin Impairment Nursing Diagnoses: Impaired tissue integrity Knowledge deficit related to ulceration/compromised skin integrity Goals: Patient/caregiver will verbalize understanding of skin care regimen Date Initiated: 03/13/2015 Goal Status: Active Ulcer/skin breakdown will have a volume reduction of 30% by week 4 Date Initiated: 03/13/2015 Goal Status: Active Ulcer/skin breakdown will have a volume reduction of 50% by week 8 Date Initiated: 03/13/2015 Goal Status: Active Ulcer/skin breakdown will have a volume reduction of 80% by week 12 Date Initiated: 03/13/2015 Goal Status: Active Ulcer/skin breakdown will heal within 14 weeks Date Initiated: 03/13/2015 Goal Status: Active Interventions: Assess patient/caregiver ability to obtain necessary supplies Assess patient/caregiver ability to perform ulcer/skin care regimen upon admission and as needed Assess ulceration(s) every visit Provide education on smoking Provide education on ulcer and skin care Treatment Activities: Patient referred  to home care : 12/12/2015 MAGGERT, Ralph Lemmie Evens ([id number]) Referred to DME Azya Barbero for dressing supplies : 12/12/2015 Skin care regimen initiated : 12/12/2015 Topical wound management initiated : 12/12/2015 Notes: Electronic Signature(s) Signed: 12/16/2015 4:28:40 PM By: Alric Quan Entered By: Alric Quan on 12/12/2015 16:03:54 Ringgold, Tayana H. ([id number]) -------------------------------------------------------------------------------- Pain Assessment Details Patient Name: [name], Emmalyne H. Date of Service: 12/12/2015 3:30 PM Medical Record Number: [id number] Patient Account Number: [id number] Date of Birth/Sex: 13-Jun-1946 (70 y.o. Female) Treating RN: Carolyne Fiscal, Debi Primary Care Physician: Paulita Cradle Other Clinician: Referring Physician: Paulita Cradle Treating Physician/Extender: Frann Rider in  Treatment: 39 Active Problems Location of Pain Severity and Description of Pain Patient Has Paino No Site Locations Pain Management and Medication Current Pain Management: Electronic Signature(s) Signed: 12/16/2015 4:28:40 PM By: Alric Quan Entered By: Alric Quan on 12/12/2015 15:36:16 Bayless, Elliana H. ([id number]) -------------------------------------------------------------------------------- Patient/Caregiver Education Details Patient Name: [name], Liahna H. Date of Service: 12/12/2015 3:30 PM Medical Record Number: [id number] Patient Account Number: [id number] Date of Birth/Gender: [birth year] (70 y.o. Female) Treating RN: Carolyne Fiscal, Debi Primary Care Physician: Paulita Cradle Other Clinician: Referring Physician: Paulita Cradle Treating Physician/Extender: Frann Rider in Treatment: 36 Education Assessment Education Provided To: Patient Education Topics Provided Wound/Skin Impairment: Handouts: Other: change dressings as directed Methods: Demonstration, Explain/Verbal Responses: State content correctly Electronic Signature(s) Signed:  12/16/2015 4:28:40 PM By: Alric Quan Entered By: Alric Quan on 12/12/2015 16:59:32 [place], [place]. ([id number]) -------------------------------------------------------------------------------- Wound Assessment Details Patient Name: Jock, Deneene H. Date of Service: 12/12/2015 3:30 PM Medical Record Number: [id number] Patient Account Number: [id number] Date of Birth/Sex: [birth year] (70 y.o. Female) Treating RN: Carolyne Fiscal, Debi Primary Care Physician: Paulita Cradle Other Clinician: Referring Physician: Paulita Cradle Treating Physician/Extender: Frann Rider in Treatment: 36 Wound Status Wound Number: 10 Primary Pressure Ulcer Etiology: Wound Location: Right Foot - Medial Wound Open Wounding Event: Gradually Appeared Status: Date Acquired: 09/12/2015 Comorbid Cataracts, Asthma, Hypertension, Type Weeks Of Treatment: 12 History: II Diabetes, Neuropathy, Received Clustered Wound: No Chemotherapy Photos Photo Uploaded By: Alric Quan on 12/12/2015 17:16:37 Wound Measurements Length: (cm) 0.5 Width: (cm) 0.5 Depth: (cm) 0.4 Area: (cm) 0.196 Volume: (cm) 0.079 % Reduction in Area: 16.9% % Reduction in Volume: -229.2% Epithelialization: None Tunneling: No Undermining: No Wound Description Classification: Category/Stage II Foul Odor Af Diabetic Severity (Wagner): Grade 1 Wound Margin: Flat and Intact Exudate Amount: Medium Exudate Type: Serous Exudate Color: amber ter Cleansing: No Wound Bed Granulation Amount: Medium (34-66%) Exposed Structure Granulation Quality: Pink Fascia Exposed: No Necrotic Amount: Medium (34-66%) Fat Layer Exposed: No Saefong, Deborha H. ([id number]) Necrotic Quality: Adherent Slough Tendon Exposed: No Muscle Exposed: No Joint Exposed: No Bone Exposed: No Limited to Skin Breakdown Periwound Skin Texture Texture Color No Abnormalities Noted: No No Abnormalities Noted: No Callus: No Erythema: Yes Crepitus:  No Erythema Location: Circumferential Excoriation: No Temperature / Pain Fluctuance: No Temperature: No Abnormality Friable: No Tenderness on Palpation: Yes Induration: No Localized Edema: No Rash: No Scarring: No Moisture No Abnormalities Noted: No Dry / Scaly: No Moist: Yes Wound Preparation Ulcer Cleansing: Rinsed/Irrigated with Saline Topical Anesthetic Applied: Other: lidocaine 4%, Treatment Notes Wound #10 (Right, Medial Foot) 1. Cleansed with: Clean wound with Normal Saline 2. Anesthetic Topical Lidocaine 4% cream to wound bed prior to debridement 3. Peri-wound Care: Skin Prep 4. Dressing Applied: Santyl Ointment 5. Secondary Dressing Applied Bordered Foam Dressing Electronic Signature(s) Signed: 12/16/2015 4:28:40 PM By: Alric Quan Entered By: Alric Quan on 12/12/2015 15:53:11 Lansing, Mazal H. ([id number]) -------------------------------------------------------------------------------- Wound Assessment Details Patient Name: Decarolis, Yael H. Date of Service: 12/12/2015 3:30 PM Medical Record Number: [id number] Patient Account Number: [id number] Date of Birth/Sex: [birth year] (70 y.o. Female) Treating RN: Carolyne Fiscal, Debi Primary Care Physician: Paulita Cradle Other Clinician: Referring Physician: Paulita Cradle Treating Physician/Extender: Frann Rider in Treatment: 39 Wound Status Wound Number: 11 Primary Pressure Ulcer Etiology: Wound Location: Right Calcaneus - Distal Wound Open Wounding Event: Pressure Injury Status: Date Acquired: 09/29/2015 Comorbid Cataracts, Asthma, Hypertension, Type Weeks Of Treatment: 10 History: II Diabetes, Neuropathy, Received Clustered Wound: No Chemotherapy Photos Photo Uploaded By: Alric Quan on 12/12/2015 17:16:37 Wound Measurements  Length: (cm) 1.8 Width: (cm) 1.7 Depth: (cm) 0.1 Area: (cm) 2.403 Volume: (cm) 0.24 % Reduction in Area: -239.9% % Reduction in Volume:  -238% Epithelialization: None Tunneling: No Undermining: No Wound Description Classification: Category/Stage II Foul Odor Af Diabetic Severity (Wagner): Grade 1 Wound Margin: Flat and Intact Exudate Amount: Large Exudate Type: Serous Exudate Color: amber ter Cleansing: No Wound Bed Granulation Amount: Small (1-33%) Exposed Structure Granulation Quality: Pink Fascia Exposed: No Necrotic Amount: Large (67-100%) Fat Layer Exposed: No Porche, Zaliyah H. ([id number]) Necrotic Quality: Adherent Slough Tendon Exposed: No Muscle Exposed: No Joint Exposed: No Bone Exposed: No Limited to Skin Breakdown Periwound Skin Texture Texture Color No Abnormalities Noted: No No Abnormalities Noted: No Callus: No Atrophie Blanche: No Crepitus: No Cyanosis: No Excoriation: No Ecchymosis: No Fluctuance: No Erythema: No Friable: No Hemosiderin Staining: No Induration: No Mottled: No Localized Edema: No Pallor: No Rash: No Rubor: No Scarring: No Temperature / Pain Moisture Temperature: No Abnormality No Abnormalities Noted: No Tenderness on Palpation: Yes Dry / Scaly: No Maceration: Yes Moist: Yes Wound Preparation Ulcer Cleansing: Rinsed/Irrigated with Saline Topical Anesthetic Applied: Other: lidocaine 4%, Treatment Notes Wound #11 (Right, Distal Calcaneus) 1. Cleansed with: Clean wound with Normal Saline 2. Anesthetic Topical Lidocaine 4% cream to wound bed prior to debridement 3. Peri-wound Care: Skin Prep 4. Dressing Applied: Santyl Ointment 5. Secondary Dressing Applied Bordered Foam Dressing Electronic Signature(s) Signed: 12/16/2015 4:28:40 PM By: Alric Quan Entered By: Alric Quan on 12/12/2015 15:53:24 Damiano, Elvina H. ([id number]) -------------------------------------------------------------------------------- Wound Assessment Details Patient Name: Koopmann, Zemira H. Date of Service: 12/12/2015 3:30 PM Medical Record Number: [id number] Patient Account  Number: [id number] Date of Birth/Sex: Nov 29, 1945 (70 y.o. Female) Treating RN: Carolyne Fiscal, Debi Primary Care Physician: Paulita Cradle Other Clinician: Referring Physician: Paulita Cradle Treating Physician/Extender: Frann Rider in Treatment: 48 Wound Status Wound Number: 13 Primary Pressure Ulcer Etiology: Wound Location: Left Calcaneus Wound Open Wounding Event: Pressure Injury Status: Date Acquired: 11/02/2015 Comorbid Cataracts, Asthma, Hypertension, Type Weeks Of Treatment: 5 History: II Diabetes, Neuropathy, Received Clustered Wound: No Chemotherapy Photos Photo Uploaded By: Alric Quan on 12/12/2015 17:16:56 Wound Measurements Length: (cm) 0.1 Width: (cm) 0.1 Depth: (cm) 0.1 Area: (cm) 0.008 Volume: (cm) 0.001 % Reduction in Area: 99.4% % Reduction in Volume: 99.3% Epithelialization: None Tunneling: No Undermining: No Wound Description Classification: Category/Stage II Foul Odor Af Diabetic Severity (Wagner): Grade 1 Wound Margin: Flat and Intact Exudate Amount: Large Exudate Type: Serous Exudate Color: amber ter Cleansing: No Wound Bed Granulation Amount: None Present (0%) Exposed Structure Necrotic Amount: Large (67-100%) Fascia Exposed: No Necrotic Quality: Eschar, Adherent Slough Fat Layer Exposed: No Ellenwood, Juhi H. ([id number]) Tendon Exposed: No Muscle Exposed: No Joint Exposed: No Bone Exposed: No Limited to Skin Breakdown Periwound Skin Texture Texture Color No Abnormalities Noted: No No Abnormalities Noted: No Moisture Temperature / Pain No Abnormalities Noted: No Temperature: No Abnormality Maceration: No Tenderness on Palpation: Yes Moist: Yes Wound Preparation Ulcer Cleansing: Rinsed/Irrigated with Saline Topical Anesthetic Applied: Other: lidocaine 4%, Treatment Notes Wound #13 (Left Calcaneus) 1. Cleansed with: Clean wound with Normal Saline 2. Anesthetic Topical Lidocaine 4% cream to wound bed prior to  debridement 3. Peri-wound Care: Skin Prep 5. Secondary Dressing Applied Bordered Foam Dressing Electronic Signature(s) Signed: 12/16/2015 4:28:40 PM By: Alric Quan Entered By: Alric Quan on 12/12/2015 15:47:51 Street, Illene H. ([id number]) -------------------------------------------------------------------------------- Wound Assessment Details Patient Name: Kinkaid, Sharai H. Date of Service: 12/12/2015 3:30 PM Medical Record Number: [id number] Patient Account Number: [id number] Date of Birth/Sex:  [birth year] (70 y.o. Female) Treating RN: Carolyne Fiscal, Debi Primary Care Physician: Paulita Cradle Other Clinician: Referring Physician: Paulita Cradle Treating Physician/Extender: Frann Rider in Treatment: 39 Wound Status Wound Number: 3 Primary Pressure Ulcer Etiology: Wound Location: Back - Midline Wound Open Wounding Event: Pressure Injury Status: Date Acquired: 05/02/2015 Comorbid Cataracts, Asthma, Hypertension, Type Weeks Of Treatment: 32 History: II Diabetes, Neuropathy, Received Clustered Wound: No Chemotherapy Photos Photo Uploaded By: Alric Quan on 12/12/2015 17:16:56 Wound Measurements Length: (cm) 2 Width: (cm) 1.5 Depth: (cm) 2 Area: (cm) 2.356 Volume: (cm) 4.712 % Reduction in Area: -970.9% % Reduction in Volume: -21318.2% Epithelialization: None Wound Description Classification: Category/Stage II Wound Margin: Distinct, outline attached Exudate Amount: Large Exudate Type: Serosanguineous Exudate Color: red, brown Foul Odor After Cleansing: No Wound Bed Granulation Amount: Medium (34-66%) Exposed Structure Granulation Quality: Red, Pink Fascia Exposed: No Necrotic Amount: Medium (34-66%) Fat Layer Exposed: No Necrotic Quality: Adherent Slough Tendon Exposed: No Deming, Nyeema H. ([id number]) Muscle Exposed: No Joint Exposed: No Bone Exposed: No Limited to Skin Breakdown Periwound Skin Texture Texture Color No  Abnormalities Noted: No No Abnormalities Noted: No Callus: No Atrophie Blanche: No Crepitus: No Cyanosis: No Excoriation: No Ecchymosis: No Fluctuance: No Erythema: No Friable: No Hemosiderin Staining: No Induration: No Mottled: No Localized Edema: No Pallor: No Rash: No Rubor: No Scarring: No Temperature / Pain Moisture Temperature: No Abnormality No Abnormalities Noted: No Dry / Scaly: No Maceration: No Moist: Yes Wound Preparation Ulcer Cleansing: Rinsed/Irrigated with Saline Topical Anesthetic Applied: Other: lidocaine 4%, Treatment Notes Wound #3 (Midline Back) 1. Cleansed with: Clean wound with Normal Saline 2. Anesthetic Topical Lidocaine 4% cream to wound bed prior to debridement 3. Peri-wound Care: Skin Prep 4. Dressing Applied: Other dressing (specify in notes) 5. Secondary Dressing Applied Bordered Foam Dressing Notes sorbact / hydrogel Electronic Signature(s) Signed: 12/16/2015 4:28:40 PM By: Alric Quan Entered By: Alric Quan on 12/12/2015 16:03:44 Lyons, Ailin H. ([id number]) Unterreiner, Enya H. ([id number]) -------------------------------------------------------------------------------- Wound Assessment Details Patient Name: Denio, Alexee H. Date of Service: 12/12/2015 3:30 PM Medical Record Number: [id number] Patient Account Number: [id number] Date of Birth/Sex: 03/15/1946 (70 y.o. 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 Wound Open Wounding Event: Pressure Injury Status: Date Acquired: 08/01/2015 Comorbid Cataracts, Asthma, Hypertension, Type Weeks Of Treatment: 18 History: II Diabetes, Neuropathy, Received Clustered Wound: No Chemotherapy Photos Photo Uploaded By:  Alric Quan on 12/12/2015 17:17:31 Wound Measurements Length: (cm) 0.8 Width: (cm) 0.4 Depth: (cm) 0.7 Area: (cm) 0.251 Volume: (cm) 0.176 % Reduction in Area: 92% % Reduction in Volume: 43.9% Epithelialization: None Tunneling: No Undermining: No Wound Description Classification: Category/Stage III Wound Margin: Distinct, outline attached Exudate Amount: Large Exudate Type: Serous Exudate Color: amber Foul Odor After Cleansing: No Wound Bed Granulation Amount: None Present (0%) Exposed Structure Necrotic Amount: Large (67-100%) Fascia Exposed: No Necrotic Quality: Adherent Slough Fat Layer Exposed: No Tendon Exposed: No Knierim, Bracha H. ([id number]) Muscle Exposed: No Joint Exposed: No Bone Exposed: No Limited to Skin Breakdown Periwound Skin Texture Texture Color No Abnormalities Noted: No No Abnormalities Noted: No Callus: No Atrophie Blanche: No Crepitus: No Cyanosis: No Excoriation: No Ecchymosis: No Fluctuance: No Erythema: No Friable: No Hemosiderin Staining: No Induration: No Mottled: No Localized Edema: No Pallor: No Rash: No Rubor: No Scarring: No Temperature / Pain Moisture Temperature: No Abnormality No  Abnormalities Noted: No Tenderness on Palpation: Yes Dry / Scaly: No Maceration: No Moist: Yes Wound Preparation Ulcer Cleansing: Rinsed/Irrigated with Saline Topical Anesthetic Applied: Other: lidocaine 4%, Treatment Notes Wound #7 (Right Ischial Tuberosity) 1. Cleansed with: Clean wound with Normal Saline 2. Anesthetic Topical Lidocaine 4% cream to wound bed prior to debridement 3. Peri-wound Care: Skin Prep 4. Dressing Applied: Santyl Ointment 5. Secondary Dressing Applied Bordered Foam Dressing Electronic Signature(s) Signed: 12/16/2015 4:28:40 PM By: Alric Quan Entered By: Alric Quan on 12/12/2015 15:59:23 Batdorf, Saleen H.  ([id number]) -------------------------------------------------------------------------------- Wound Assessment Details Patient Name: Bushey, Tennie H. Date of Service: 12/12/2015 3:30 PM Medical Record Number: [id number] Patient Account Number: [id number] Date of Birth/Sex: 08-29-1946 (70 y.o. Female) Treating RN: Carolyne Fiscal, Debi Primary Care Physician: Paulita Cradle Other Clinician: Referring Physician: Paulita Cradle Treating Physician/Extender: Frann Rider in Treatment: 87 Wound Status Wound Number: 8 Primary Pressure Ulcer Etiology: Wound Location: Left Malleolus Wound Open Wounding Event: Gradually Appeared Status: Date Acquired: 09/12/2015 Comorbid Cataracts, Asthma, Hypertension, Type Weeks Of Treatment: 12 History: II Diabetes, Neuropathy, Received Clustered Wound: No Chemotherapy Photos Photo Uploaded By: Alric Quan on 12/12/2015 17:17:31 Wound Measurements Length: (cm) 1 Width: (cm) 0.9 Depth: (cm) 0.1 Area: (cm) 0.707 Volume: (cm) 0.071 % Reduction in Area: -1404.3% % Reduction in Volume: -688.9% Epithelialization: None Tunneling: No Undermining: No Wound Description Classification: Category/Stage II Foul Odor A Diabetic Severity (Wagner): Grade 0 Wound Margin: Flat and Intact Exudate Amount: Large Exudate Type: Serous Exudate Color: amber fter Cleansing: No Wound Bed Granulation Amount: Medium (34-66%) Exposed Structure Necrotic Amount: Medium (34-66%) Fascia Exposed: No Necrotic Quality: Adherent Slough Fat Layer Exposed: No Simpson, Clemencia H. ([id number]) Tendon Exposed: No Muscle Exposed: No Joint Exposed: No Bone Exposed: No Limited to Skin Breakdown Periwound Skin Texture Texture Color No Abnormalities Noted: No No Abnormalities Noted: No Callus: No Atrophie Blanche: No Crepitus: No Cyanosis: No Excoriation: No Ecchymosis: No Fluctuance: No Erythema: Yes Friable: No Erythema Location:  Circumferential Induration: No Hemosiderin Staining: No Localized Edema: No Mottled: No Rash: No Pallor: No Scarring: No Rubor: No Moisture Temperature / Pain No Abnormalities Noted: No Temperature: No Abnormality Dry / Scaly: No Tenderness on Palpation: Yes Maceration: No Moist: Yes Wound Preparation Ulcer Cleansing: Rinsed/Irrigated with Saline Topical Anesthetic Applied: Other: Lidocaine 4%, Treatment Notes Wound #8 (Left Malleolus) 1. Cleansed with: Clean wound with Normal Saline 2. Anesthetic Topical Lidocaine 4% cream to wound bed prior to debridement 3. Peri-wound Care: Skin Prep 4. Dressing Applied: Santyl Ointment 5. Secondary Dressing Applied Bordered Foam Dressing Electronic Signature(s) Signed: 12/16/2015 4:28:40 PM By: Alric Quan Entered By: Alric Quan on 12/12/2015 15:48:59 Thorman, Bayla H. ([id number]) -------------------------------------------------------------------------------- Wound Assessment Details Patient Name: Trebilcock, Teeghan H. Date of Service: 12/12/2015 3:30 PM Medical Record Number: [id number] Patient Account Number: [id number] Date of Birth/Sex: 05-20-1946 (70 y.o. Female) Treating RN: Carolyne Fiscal, Debi Primary Care Physician: Paulita Cradle Other Clinician: Referring Physician: Paulita Cradle Treating Physician/Extender: Frann Rider in Treatment: 20 Wound Status Wound Number: 9 Primary Pressure Ulcer Etiology: Wound Location: Left Ischial Tuberosity Wound Open Wounding Event: Gradually Appeared Status: Date Acquired: 09/12/2015 Comorbid Cataracts, Asthma, Hypertension, Type Weeks Of Treatment: 12 History: II Diabetes, Neuropathy, Received Clustered Wound: No Chemotherapy Photos Photo Uploaded By: Alric Quan on 12/12/2015 17:17:43 Wound Measurements Length: (cm) 0.5 Width: (cm) 0.3 Depth: (cm) 0.1 Area: (cm) 0.118 Volume: (cm) 0.012 % Reduction in Area: 39.8% % Reduction in Volume:  40% Epithelialization: None Tunneling: No Undermining: No Wound Description Classification: Category/Stage II Wound Margin:  Flat and Intact Exudate Amount: Medium Exudate Type: Serous Exudate Color: amber Foul Odor After Cleansing: No Wound Bed Granulation Amount: None Present (0%) Exposed Structure Necrotic Amount: Large (67-100%) Fascia Exposed: No Necrotic Quality: Adherent Slough Fat Layer Exposed: No Tendon Exposed: No Gest, Maryjean H. ([id number]) Muscle Exposed: No Joint Exposed: No Bone Exposed: No Limited to Skin Breakdown Periwound Skin Texture Texture Color No Abnormalities Noted: No No Abnormalities Noted: No Callus: No Atrophie Blanche: No Crepitus: No Cyanosis: No Excoriation: No Ecchymosis: No Fluctuance: No Erythema: No Friable: No Hemosiderin Staining: No Induration: No Mottled: No Localized Edema: No Pallor: No Rash: No Rubor: No Scarring: No Temperature / Pain Moisture Temperature: No Abnormality No Abnormalities Noted: No Tenderness on Palpation: Yes Dry / Scaly: No Maceration: No Moist: Yes Wound Preparation Ulcer Cleansing: Rinsed/Irrigated with Saline Topical Anesthetic Applied: Other: lidocaine 4%, Treatment Notes Wound #9 (Left Ischial Tuberosity) 1. Cleansed with: Clean wound with Normal Saline 2. Anesthetic Topical Lidocaine 4% cream to wound bed prior to debridement 3. Peri-wound Care: Skin Prep 5. Secondary Dressing Applied Bordered Foam Dressing Electronic Signature(s) Signed: 12/16/2015 4:28:40 PM By: Alric Quan Entered By: Alric Quan on 12/12/2015 15:58:11 Beshears, Seanna H. ([id number]) -------------------------------------------------------------------------------- Vitals Details Patient Name: [name], Akshaya H. Date of Service: 12/12/2015 3:30 PM Medical Record Number: [id number] Patient Account Number: [id number] Date of Birth/Sex: Jul 05, 1946 (70 y.o. Female) Treating RN: Carolyne Fiscal, Debi Primary Care  Physician: Paulita Cradle Other Clinician: Referring Physician: Paulita Cradle Treating Physician/Extender: Frann Rider in Treatment: 39 Vital Signs Time Taken: 15:40 Temperature (F): 97.7 Height (in): 65 Pulse (bpm): 108 Weight (lbs): 100 Respiratory Rate (breaths/min): 18 Body Mass Index (BMI): 16.6 Blood Pressure (mmHg): 111/65 Reference Range: 80 - 120 mg / dl Electronic Signature(s) Signed: 12/16/2015 4:28:40 PM By: Alric Quan Entered By: Alric Quan on 12/12/2015 15:40:14

## 2015-12-17 NOTE — Progress Notes (Signed)
TEWES, Jettie H. ([id number]) Visit Report for 12/12/2015 Chief Complaint Document Details Patient Name: SHIFFMAN, [place] H. Date of Service: 12/12/2015 3:30 PM Medical Record Number: [id number] Patient Account Number: [id number] Date of Birth/Sex: 1945-10-20 (70 y.o. Female) Treating RN: Ahmed Prima Primary Care Physician: Paulita Cradle Other Clinician: Referring Physician: Paulita Cradle Treating Physician/Extender: Frann Rider in Treatment: 56 Information Obtained from: Patient Chief Complaint Patient presents to the wound care center for a consult due non healing wound. Ulcers on the right elbow and the right heel for about 1 month. Electronic Signature(s) Signed: 12/12/2015 4:30:38 PM By: Christin Fudge MD, FACS Entered By: Christin Fudge on 12/12/2015 16:30:38 Alcantar, Manuella H. ([id number]) -------------------------------------------------------------------------------- HPI Details Patient Name: Zuidema, Sincerity H. Date of Service: 12/12/2015 3:30 PM Medical Record Number: [id number] Patient Account Number: [id number] Date of Birth/Sex: 03/22/1946 (70 y.o. Female) Treating RN: Carolyne Fiscal, Debi Primary Care Physician: Paulita Cradle Other Clinician: Referring Physician: Paulita Cradle Treating Physician/Extender: Frann Rider in Treatment: 8 History of Present Illness Location: Ulceration on the right heel and the right elbow. Quality: Patient reports experiencing a dull pain to affected area(s). Severity: Patient states wound (s) are getting better. Duration: Patient has had the wound for < 4 weeks prior to presenting for treatment Timing: Pain in wound is Intermittent (comes and goes Context: The wound appeared gradually over time Modifying Factors: Consults to this date include:Augmentin and Bactrim and also some heel protection with duoderm Associated Signs and Symptoms: Patient reports having difficulty standing for long periods. HPI Description:  70 year old female with history of peripheral neuropathy, history of diet controlled diabetes mellitus type 2, history of alcoholism here for wound consult sent by her PCP Dr. Sherilyn Cooter. She has pressure ulcers at her right elbow, bilateral heels. Plain films of right calcaneus without acute bony process. Patient started by PCP on Augmentin, Bactrim as per orders, DuoDerm dressings applied - reports some improvement in her ulcer since last seen. Denies fever, chills, nausea, vomiting, diarrhea. She had a right humerus fracture in the middle of May and has had no surgery and arm is in a sling. She is also been laying in the bed for quite a while. Past medical history significant for essential hypertension, osteoporosis, peripheral neuropathy, alcoholism, ataxia, personal history of breast cancer treated with surgery chemotherapy and radiation and this was done in December 2010. she is also status post laparoscopic cholecystectomy, pilonidal cyst excision, subcutaneous port placement, partial mastectomy on the left side, skin cancer removal. 03/21/2015 -- she says overall she's been doing better and continues to smoke about 15 cigarettes a day. 03/21/2015 - her orthopedic doctor has said she may require surgery for her right humerus fracture. 04/04/2015 -- her orthopedic surgery has been scheduled for August 11. 04/18/2015 -- she is doing fine as far as her elbow and her right heel goes but she has developed some redness over prominence on her thoracic spine and wanted me to take a look at this. 05/02/2015 -- she had her surgery done and now is in a sling and support. Her back has developed a pressure injury of undetermined stage. She seems to be in better spirits. 05/16/2015 -- last week her right heel was looking great and we had healed it out, but she has not been offloading appropriately and has a deep tissue injury on the right heel again. The area on her right elbow has opened out with slough and  the area in the thoracic spine is also getting worse. 05/30/2015 -- she has developed  2 new ulcerations one on her left ischial tuberosity and one on the sacral region. She is still working on getting up smoking but is also unable to take her vitamins as she says she develops a diarrhea when she takes vitamins. She has increased her intake of proteins. 06/06/2015 -- the patient's husband manages to get her a low air loss mattress with initiating pressure but Barna, Kristl H. ([id number]) did not know how to use it exactly and the patient was not happy about using it. Overall she says she's been feeling better. 07/11/2015 -- . Discussed a surgical opinion for debridement and application of a wound VAC, 2 weeks ago but the patient has been reluctant to get a surgical opinion as she wants to avoid surgery. 07/18/2015 -- they have an appointment to see Dr. Tamala Julian this coming Wednesday. 07/29/2015 -- they saw Dr. Tamala Julian in his office and he did a debridement of the wound and removed significant amount of slough. This is in addition to the debridement I had done previously on Friday where a large amount of the eschar was removed. He did not recommend the application of wound VAC. Addendum : Dr. Thompson Caul note was reviewed via EPIC and details noted as above. 08/05/2015 -- over the last week she has developed a another pressure injury to her right hip and has had significant discoloration of the skin and an eschar there. 08/15/2015 -- she is still smoking about a pack of cigarettes a day and does not seem to want to quit. They have not been able to talk to the vendor regarding the air mattress and I will ask them to get in touch again. She is reluctant to take vitamins and does admit her nutrition is poor. 08/22/2015 -- she has been unable to tolerate her vitamins and continues to smoke. They're working on getting a low air loss mattress and have been speaking with the vendor. 09/19/2015 -- since her last  visit and was admitted to the hospital between 09/09/2015 and 09/16/2015. She was thought to have an active sepsis possibly from one of her decubitus ulcers but nothing was grown except for an MSSA from her thoracic spine region. CT of this area did not show any osteomyelitis. Been given IV antibiotics which included vancomycin and Zosyn in the hospital under the care of Dr. [name] the ID specialist she was sent home on oral Keflex 500 mg 3 times a day for 2 weeks. he will consider an MRI in the outpatient setting to completely rule out osteomyelitis of the spine. 10/03/2015 --readmitted to hospital on 09/23/2015 for general debility, lethargy and possible sepsis and workup was in progress. Seen by Dr. [name] and he would also like a workup for underlying malignancy as sheos had previous ultrasounds of the breasts suggesting findings of concern. Workup done so far does not suggest deep bony infection. She was treated for a pneumonia and received Zosyn and vancomycin. She had been recommended Cipro 500 twice a day and doxycycline 100 mg twice a day once she was to be [place] home. The antibiotics were to be stopped on 10/07/2015. 10/17/2015 -- patient is feeling much better and has been eating better and doing her offloading as much as possible. 10/23/2015 -- she has an appointment to see Dr. [name] tomorrow but other than that has been doing as much as possible with offloading and increasing her diet. 11/07/2015 -- she has not been here to see [place] for 2 weeks and at the present time continues to  try and eat better, work on off loading and is using the air mattress at night. She saw her PCP yesterday and she has put her back on a fentanyl patch. 11/21/2015 -- for some strange reason she has been sitting in a chair with her legs dependent for the last 6 days nonstop and has developed massive lower extremity edema and pedal edema with weeping and ulceration. Electronic  Signature(s) Signed: 12/12/2015 4:30:49 PM By: Christin Fudge MD, FACS Entered By: Christin Fudge on 12/12/2015 16:30:49 Hanlon, Carlinda H. ([id number]) -------------------------------------------------------------------------------- Physical Exam Details Patient Name: Cappuccio, Christyn H. Date of Service: 12/12/2015 3:30 PM Medical Record Number: [id number] Patient Account Number: [id number] Date of Birth/Sex: December 21, 1945 (70 y.o. Female) Treating RN: Ahmed Prima Primary Care Physician: Paulita Cradle Other Clinician: Referring Physician: Paulita Cradle Treating Physician/Extender: Frann Rider in Treatment: 39 Constitutional . Pulse regular. Respirations normal and unlabored. Afebrile. . Eyes Nonicteric. Reactive to light. Ears, Nose, Mouth, and Throat Lips, teeth, and gums WNL.Marland Kitchen Moist mucosa without lesions. Neck supple and nontender. No palpable supraclavicular or cervical adenopathy. Normal sized without goiter. Respiratory WNL. No retractions.. Cardiovascular Pedal Pulses WNL. No clubbing, cyanosis or edema. Lymphatic No adneopathy. No adenopathy. No adenopathy. Musculoskeletal Adexa without tenderness or enlargement.. Digits and nails w/o clubbing, cyanosis, infection, petechiae, ischemia, or inflammatory conditions.. Integumentary (Hair, Skin) No suspicious lesions. No crepitus or fluctuance. No peri-wound warmth or erythema. No masses.Marland Kitchen Psychiatric Judgement and insight Intact.. No evidence of depression, anxiety, or agitation.. Notes the lymphedema both lower extremities is completely gone down and the wounds on her heels look much better and only need form borders. The wounds on her right lower extremity and right hip need Santyl ointment and on her back we will use salt backed with hydrogel and a bordered foam. Electronic Signature(s) Signed: 12/12/2015 4:31:45 PM By: Christin Fudge MD, FACS Entered By: Christin Fudge on 12/12/2015 16:31:45 Schindel, Yailin H.  ([id number]) -------------------------------------------------------------------------------- Physician Orders Details Patient Name: Phillis, Sabra H. Date of Service: 12/12/2015 3:30 PM Medical Record Number: [id number] Patient Account Number: [id number] Date of Birth/Sex: 1945/10/15 (70 y.o. Female) Treating RN: Carolyne Fiscal, Debi Primary Care Physician: Paulita Cradle Other Clinician: Referring Physician: Paulita Cradle Treating Physician/Extender: Frann Rider in Treatment: 22 Verbal / Phone Orders: Yes Clinician: Carolyne Fiscal, Debi Read Back and Verified: Yes Diagnosis Coding Wound Cleansing Wound #10 Right,Medial Foot o Clean wound with Normal Saline. o Cleanse wound with mild soap and water Wound #11 Right,Distal Calcaneus o Clean wound with Normal Saline. o Cleanse wound with mild soap and water Wound #13 Left Calcaneus o Clean wound with Normal Saline. o Cleanse wound with mild soap and water Wound #3 Midline Back o Clean wound with Normal Saline. o Cleanse wound with mild soap and water Wound #7 Right Ischial Tuberosity o Clean wound with Normal Saline. o Cleanse wound with mild soap and water Wound #8 Left Malleolus o Clean wound with Normal Saline. o Cleanse wound with mild soap and water Wound #9 Left Ischial Tuberosity o Clean wound with Normal Saline. o Cleanse wound with mild soap and water Anesthetic Wound #10 Right,Medial Foot o Topical Lidocaine 4% cream applied to wound bed prior to debridement Wound #11 Right,Distal Calcaneus o Topical Lidocaine 4% cream applied to wound bed prior to debridement Wound #13 Left Calcaneus o Topical Lidocaine 4% cream applied to wound bed prior to debridement Coccia, Christmas H. ([id number]) Wound #3 Midline Back o Topical Lidocaine 4% cream applied to wound bed prior to debridement Wound #7 Right Ischial  Tuberosity o Topical Lidocaine 4% cream applied to wound bed prior to  debridement Wound #8 Left Malleolus o Topical Lidocaine 4% cream applied to wound bed prior to debridement Wound #9 Left Ischial Tuberosity o Topical Lidocaine 4% cream applied to wound bed prior to debridement Skin Barriers/Peri-Wound Care Wound #10 Right,Medial Foot o Skin Prep Wound #11 Right,Distal Calcaneus o Skin Prep Wound #13 Left Calcaneus o Skin Prep Wound #3 Midline Back o Skin Prep Wound #7 Right Ischial Tuberosity o Skin Prep Wound #8 Left Malleolus o Skin Prep Wound #9 Left Ischial Tuberosity o Skin Prep Primary Wound Dressing Wound #10 Right,Medial Foot o Santyl Ointment Wound #11 Right,Distal Calcaneus o Santyl Ointment Wound #13 Left Calcaneus o Other: - BFD Wound #3 Midline Back o Other: - Sorbact with hydrogel Wound #7 Right Ischial Tuberosity Pare, Francille H. ([id number]) o Santyl Ointment o Plain packing gauze - 1/4 inch Wound #8 Left Malleolus o Santyl Ointment Wound #9 Left Ischial Tuberosity o Other: - BFD Secondary Dressing Wound #10 Right,Medial Foot o Boardered Foam Dressing Wound #11 Right,Distal Calcaneus o Boardered Foam Dressing Wound #13 Left Calcaneus o Boardered Foam Dressing Wound #3 Midline Back o Boardered Foam Dressing Wound #7 Right Ischial Tuberosity o Boardered Foam Dressing Wound #8 Left Malleolus o Boardered Foam Dressing Wound #9 Left Ischial Tuberosity o Boardered Foam Dressing Dressing Change Frequency Wound #10 Right,Medial Foot o Change dressing every day. Wound #11 Right,Distal Calcaneus o Change dressing every day. Wound #13 Left Calcaneus o Change dressing every day. Wound #3 Midline Back o Change dressing every day. Wound #7 Right Ischial Tuberosity o Change dressing every day. Wound #8 Left Malleolus o Change dressing every day. Ponciano, Yamaris H. ([id number]) Wound #9 Left Ischial Tuberosity o Change dressing every day. Follow-up  Appointments Wound #10 Right,Medial Foot o Return Appointment in 2 weeks. Wound #11 Right,Distal Calcaneus o Return Appointment in 2 weeks. Wound #13 Left Calcaneus o Return Appointment in 2 weeks. Wound #3 Midline Back o Return Appointment in 2 weeks. Wound #7 Right Ischial Tuberosity o Return Appointment in 2 weeks. Wound #8 Left Malleolus o Return Appointment in 2 weeks. Wound #9 Left Ischial Tuberosity o Return Appointment in 2 weeks. Edema Control o Elevate legs to the level of the heart and pump ankles as often as possible Off-Loading Wound #10 Right,Medial Foot o Turn and reposition every 2 hours o Other: - float heels while in the bed Wound #11 Right,Distal Calcaneus o Turn and reposition every 2 hours o Other: - float heels while in the bed Wound #13 Left Calcaneus o Turn and reposition every 2 hours o Other: - float heels while in the bed Wound #3 Midline Back o Turn and reposition every 2 hours o Other: - float heels while in the bed Wound #7 Right Ischial Tuberosity o Turn and reposition every 2 hours o Other: - float heels while in the bed Piotrowski, Kalysta H. ([id number]) Wound #8 Left Malleolus o Turn and reposition every 2 hours o Other: - float heels while in the bed Wound #9 Left Ischial Tuberosity o Turn and reposition every 2 hours o Other: - float heels while in the bed Home Health Wound #10 [place] Visits - [place] Nurse may visit PRN to address patientos wound care needs. o FACE TO FACE ENCOUNTER: MEDICARE and MEDICAID PATIENTS: I certify that this patient is under my care and that I had a face-to-face encounter that meets the physician face-to-face encounter  requirements with this patient on this date. The encounter with the patient was in whole or in part for the following MEDICAL CONDITION: (primary reason for [place]) MEDICAL NECESSITY: I  certify, that based on my findings, NURSING services are a medically necessary home health service. HOME BOUND STATUS: I certify that my clinical findings support that this patient is homebound (i.e., Due to illness or injury, pt requires aid of supportive devices such as crutches, cane, wheelchairs, walkers, the use of special transportation or the assistance of another person to leave their place of residence. There is a normal inability to leave the home and doing so requires considerable and taxing effort. Other absences are for medical reasons / religious services and are infrequent or of short duration when for other reasons). o If current dressing causes regression in wound condition, may D/C ordered dressing product/s and apply Normal Saline Moist Dressing daily until next [place] / Other MD appointment. [place] of regression in wound condition at [phone]. o Please direct any NON-WOUND related issues/requests for orders to patient's Primary Care Physician Wound #11 [place] Visits - [place] Nurse may visit PRN to address patientos wound care needs. o FACE TO FACE ENCOUNTER: MEDICARE and MEDICAID PATIENTS: I certify that this patient is under my care and that I had a face-to-face encounter that meets the physician face-to-face encounter requirements with this patient on this date. The encounter with the patient was in whole or in part for the following MEDICAL CONDITION: (primary reason for [place]) MEDICAL NECESSITY: I certify, that based on my findings, NURSING services are a medically necessary home health service. HOME BOUND STATUS: I certify that my clinical findings support that this patient is homebound (i.e., Due to illness or injury, pt requires aid of supportive devices such as crutches, cane, wheelchairs, walkers, the use of special transportation or the assistance of  another person to leave their place of residence. There is a normal inability to leave the home and doing so requires considerable and taxing effort. Other absences are for medical reasons / religious services and are infrequent or of short duration when for other reasons). o If current dressing causes regression in wound condition, may D/C ordered dressing product/s and apply Normal Saline Moist Dressing daily until next [place] / Other MD appointment. [place] of regression in wound condition at [phone]. Fletes, Karey H. ([id number]) o Please direct any NON-WOUND related issues/requests for orders to patient's Primary Care Physician Wound #13 Left Calcaneus o [place] Visits - [place] Nurse may visit PRN to address patientos wound care needs. o FACE TO FACE ENCOUNTER: MEDICARE and MEDICAID PATIENTS: I certify that this patient is under my care and that I had a face-to-face encounter that meets the physician face-to-face encounter requirements with this patient on this date. The encounter with the patient was in whole or in part for the following MEDICAL CONDITION: (primary reason for [place]) MEDICAL NECESSITY: I certify, that based on my findings, NURSING services are a medically necessary home health service. HOME BOUND STATUS: I certify that my clinical findings support that this patient is homebound (i.e., Due to illness or injury, pt requires aid of supportive devices such as crutches, cane, wheelchairs, walkers, the use of special transportation or the assistance of another person to leave their place of residence. There is a normal inability to leave the home and doing so  requires considerable and taxing effort. Other absences are for medical reasons / religious services and are infrequent or of short duration when for other reasons). o If current dressing causes regression in wound condition, may D/C  ordered dressing product/s and apply Normal Saline Moist Dressing daily until next [place] / Other MD appointment. [place] of regression in wound condition at [phone]. o Please direct any NON-WOUND related issues/requests for orders to patient's Primary Care Physician Wound #3 Midline Back o [place] Visits - [place] Nurse may visit PRN to address patientos wound care needs. o FACE TO FACE ENCOUNTER: MEDICARE and MEDICAID PATIENTS: I certify that this patient is under my care and that I had a face-to-face encounter that meets the physician face-to-face encounter requirements with this patient on this date. The encounter with the patient was in whole or in part for the following MEDICAL CONDITION: (primary reason for [place]) MEDICAL NECESSITY: I certify, that based on my findings, NURSING services are a medically necessary home health service. HOME BOUND STATUS: I certify that my clinical findings support that this patient is homebound (i.e., Due to illness or injury, pt requires aid of supportive devices such as crutches, cane, wheelchairs, walkers, the use of special transportation or the assistance of another person to leave their place of residence. There is a normal inability to leave the home and doing so requires considerable and taxing effort. Other absences are for medical reasons / religious services and are infrequent or of short duration when for other reasons). o If current dressing causes regression in wound condition, may D/C ordered dressing product/s and apply Normal Saline Moist Dressing daily until next [place] / Other MD appointment. [place] of regression in wound condition at [phone]. o Please direct any NON-WOUND related issues/requests for orders to patient's Primary Care Physician Wound #7 Right Ischial [place] Visits -  [place] Nurse may visit PRN to address patientos wound care needs. o FACE TO FACE ENCOUNTER: MEDICARE and MEDICAID PATIENTS: I certify that this patient is under my care and that I had a face-to-face encounter that meets the physician face-to-face [place], Harr[place]. ([id number]) encounter requirements with this patient on this date. The encounter with the patient was in whole or in part for the following MEDICAL CONDITION: (primary reason for [place]) MEDICAL NECESSITY: I certify, that based on my findings, NURSING services are a medically necessary home health service. HOME BOUND STATUS: I certify that my clinical findings support that this patient is homebound (i.e., Due to illness or injury, pt requires aid of supportive devices such as crutches, cane, wheelchairs, walkers, the use of special transportation or the assistance of another person to leave their place of residence. There is a normal inability to leave the home and doing so requires considerable and taxing effort. Other absences are for medical reasons / religious services and are infrequent or of short duration when for other reasons). o If current dressing causes regression in wound condition, may D/C ordered dressing product/s and apply Normal Saline Moist Dressing daily until next [place] / Other MD appointment. [place] of regression in wound condition at [phone]. o Please direct any NON-WOUND related issues/requests for orders to patient's Primary Care Physician Wound #8 Left [place] Visits - [place] Nurse may visit PRN to address patientos wound care needs. o FACE TO  FACE ENCOUNTER: MEDICARE and MEDICAID PATIENTS: I certify that this patient is under my care and that I had a face-to-face encounter that meets the physician face-to-face encounter requirements with this patient on this date. The encounter with the  patient was in whole or in part for the following MEDICAL CONDITION: (primary reason for [place]) MEDICAL NECESSITY: I certify, that based on my findings, NURSING services are a medically necessary home health service. HOME BOUND STATUS: I certify that my clinical findings support that this patient is homebound (i.e., Due to illness or injury, pt requires aid of supportive devices such as crutches, cane, wheelchairs, walkers, the use of special transportation or the assistance of another person to leave their place of residence. There is a normal inability to leave the home and doing so requires considerable and taxing effort. Other absences are for medical reasons / religious services and are infrequent or of short duration when for other reasons). o If current dressing causes regression in wound condition, may D/C ordered dressing product/s and apply Normal Saline Moist Dressing daily until next [place] / Other MD appointment. [place] of regression in wound condition at [phone]. o Please direct any NON-WOUND related issues/requests for orders to patient's Primary Care Physician Wound #9 Left Ischial [place] Visits - [place] Nurse may visit PRN to address patientos wound care needs. o FACE TO FACE ENCOUNTER: MEDICARE and MEDICAID PATIENTS: I certify that this patient is under my care and that I had a face-to-face encounter that meets the physician face-to-face encounter requirements with this patient on this date. The encounter with the patient was in whole or in part for the following MEDICAL CONDITION: (primary reason for [place]) MEDICAL NECESSITY: I certify, that based on my findings, NURSING services are a medically necessary home health service. HOME BOUND STATUS: I certify that my clinical findings support that this patient is homebound (i.e., Due to illness or injury, pt requires  aid of supportive devices such as crutches, cane, wheelchairs, walkers, the use of special transportation or the assistance of another person to leave their place of residence. There is a normal inability to leave the home and doing so requires considerable and taxing effort. Other Vanderhoff, Jahnia H. ([id number]) absences are for medical reasons / religious services and are infrequent or of short duration when for other reasons). o If current dressing causes regression in wound condition, may D/C ordered dressing product/s and apply Normal Saline Moist Dressing daily until next [place] / Other MD appointment. [place] of regression in wound condition at [phone]. o Please direct any NON-WOUND related issues/requests for orders to patient's Primary Care Physician Electronic Signature(s) Signed: 12/12/2015 4:33:30 PM By: Christin Fudge MD, FACS Signed: 12/16/2015 4:28:40 PM By: Alric Quan Entered By: Alric Quan on 12/12/2015 16:18:05 Enfield, Alvia H. ([id number]) -------------------------------------------------------------------------------- Problem List Details Patient Name: Stadel, Hailey H. Date of Service: 12/12/2015 3:30 PM Medical Record Number: [id number] Patient Account Number: [id number] Date of Birth/Sex: 10/16/1945 (70 y.o. Female) Treating RN: Ahmed Prima Primary Care Physician: Paulita Cradle Other Clinician: Referring Physician: Paulita Cradle Treating Physician/Extender: Frann Rider in Treatment: 85 Active Problems ICD-10 Encounter Code Description Active Date Diagnosis E11.621 Type 2 diabetes mellitus with foot ulcer 03/13/2015 Yes L89.613 Pressure ulcer of right heel, stage 3 03/13/2015 Yes F17.218 Nicotine dependence, cigarettes, with other nicotine- 03/13/2015 Yes induced disorders L89.100 Pressure ulcer of unspecified part of back, unstageable 05/02/2015 Yes [id number]  Pressure ulcer of right hip, stage 3  08/05/2015 Yes I89.0 Lymphedema, not elsewhere classified 11/21/2015 Yes L97.211 Non-pressure chronic ulcer of right calf limited to 11/21/2015 Yes breakdown of skin Inactive Problems Resolved Problems ICD-10 Code Description Active Date Resolved Date L89.013 Pressure ulcer of right elbow, stage 3 03/13/2015 03/13/2015 [id number] Pressure ulcer of left hip, stage 2 05/30/2015 05/30/2015 Winecoff, Rhegan H. ([id number]) L89.153 Pressure ulcer of sacral region, stage 3 05/30/2015 05/30/2015 Electronic Signature(s) Signed: 12/12/2015 4:30:24 PM By: Christin Fudge MD, FACS Entered By: Christin Fudge on 12/12/2015 16:30:24 Srey, Francina H. ([id number]) -------------------------------------------------------------------------------- Progress Note Details Patient Name: Gorka, Lynnell H. Date of Service: 12/12/2015 3:30 PM Medical Record Number: [id number] Patient Account Number: [id number] Date of Birth/Sex: 25-Oct-1945 (70 y.o. Female) Treating RN: Ahmed Prima Primary Care Physician: Paulita Cradle Other Clinician: Referring Physician: Paulita Cradle Treating Physician/Extender: Frann Rider in Treatment: 99 Subjective Chief Complaint Information obtained from Patient Patient presents to the wound care center for a consult due non healing wound. Ulcers on the right elbow and the right heel for about 1 month. History of Present Illness (HPI) The following HPI elements were documented for the patient's wound: Location: Ulceration on the right heel and the right elbow. Quality: Patient reports experiencing a dull pain to affected area(s). Severity: Patient states wound (s) are getting better. Duration: Patient has had the wound for < 4 weeks prior to presenting for treatment Timing: Pain in wound is Intermittent (comes and goes Context: The wound appeared gradually over time Modifying Factors: Consults to this date include:Augmentin and Bactrim and also some heel protection  with duoderm Associated Signs and Symptoms: Patient reports having difficulty standing for long periods. 70 year old female with history of peripheral neuropathy, history of diet controlled diabetes mellitus type 2, history of alcoholism here for wound consult sent by her PCP Dr. Sherilyn Cooter. She has pressure ulcers at her right elbow, bilateral heels. Plain films of right calcaneus without acute bony process. Patient started by PCP on Augmentin, Bactrim as per orders, DuoDerm dressings applied - reports some improvement in her ulcer since last seen. Denies fever, chills, nausea, vomiting, diarrhea. She had a right humerus fracture in the middle of May and has had no surgery and arm is in a sling. She is also been laying in the bed for quite a while. Past medical history significant for essential hypertension, osteoporosis, peripheral neuropathy, alcoholism, ataxia, personal history of breast cancer treated with surgery chemotherapy and radiation and this was done in December 2010. she is also status post laparoscopic cholecystectomy, pilonidal cyst excision, subcutaneous port placement, partial mastectomy on the left side, skin cancer removal. 03/21/2015 -- she says overall she's been doing better and continues to smoke about 15 cigarettes a day. 03/21/2015 - her orthopedic doctor has said she may require surgery for her right humerus fracture. 04/04/2015 -- her orthopedic surgery has been scheduled for August 11. 04/18/2015 -- she is doing fine as far as her elbow and her right heel goes but she has developed some redness over prominence on her thoracic spine and wanted me to take a look at this. Coffel, Lonya H. ([id number]) 05/02/2015 -- she had her surgery done and now is in a sling and support. Her back has developed a pressure injury of undetermined stage. She seems to be in better spirits. 05/16/2015 -- last week her right heel was looking great and we had healed it out, but she has not  been offloading appropriately and has a deep tissue injury on the  right heel again. The area on her right elbow has opened out with slough and the area in the thoracic spine is also getting worse. 05/30/2015 -- she has developed 2 new ulcerations one on her left ischial tuberosity and one on the sacral region. She is still working on getting up smoking but is also unable to take her vitamins as she says she develops a diarrhea when she takes vitamins. She has increased her intake of proteins. 06/06/2015 -- the patient's husband manages to get her a low air loss mattress with initiating pressure but did not know how to use it exactly and the patient was not happy about using it. Overall she says she's been feeling better. 07/11/2015 -- . Discussed a surgical opinion for debridement and application of a wound VAC, 2 weeks ago but the patient has been reluctant to get a surgical opinion as she wants to avoid surgery. 07/18/2015 -- they have an appointment to see Dr. Tamala Julian this coming Wednesday. 07/29/2015 -- they saw Dr. Tamala Julian in his office and he did a debridement of the wound and removed significant amount of slough. This is in addition to the debridement I had done previously on Friday where a large amount of the eschar was removed. He did not recommend the application of wound VAC. Addendum : Dr. Thompson Caul note was reviewed via EPIC and details noted as above. 08/05/2015 -- over the last week she has developed a another pressure injury to her right hip and has had significant discoloration of the skin and an eschar there. 08/15/2015 -- she is still smoking about a pack of cigarettes a day and does not seem to want to quit. They have not been able to talk to the vendor regarding the air mattress and I will ask them to get in touch again. She is reluctant to take vitamins and does admit her nutrition is poor. 08/22/2015 -- she has been unable to tolerate her vitamins and continues to smoke.  They're working on getting a low air loss mattress and have been speaking with the vendor. 09/19/2015 -- since her last visit and was admitted to the hospital between 09/09/2015 and 09/16/2015. She was thought to have an active sepsis possibly from one of her decubitus ulcers but nothing was grown except for an MSSA from her thoracic spine region. CT of this area did not show any osteomyelitis. Been given IV antibiotics which included vancomycin and Zosyn in the hospital under the care of Dr. [name] the ID specialist she was sent home on oral Keflex 500 mg 3 times a day for 2 weeks. he will consider an MRI in the outpatient setting to completely rule out osteomyelitis of the spine. 10/03/2015 --readmitted to hospital on 09/23/2015 for general debility, lethargy and possible sepsis and workup was in progress. Seen by Dr. [name] and he would also like a workup for underlying malignancy as she s had previous ultrasounds of the breasts suggesting findings of concern. Workup done so far does not suggest deep bony infection. She was treated for a pneumonia and received Zosyn and vancomycin. She had been recommended Cipro 500 twice a day and doxycycline 100 mg twice a day once she was to be [place] home. The antibiotics were to be stopped on 10/07/2015. 10/17/2015 -- patient is feeling much better and has been eating better and doing her offloading as much as possible. 10/23/2015 -- she has an appointment to see Dr. [name] tomorrow but other than that has been doing as much as  possible with offloading and increasing her diet. 11/07/2015 -- she has not been here to see [place] for 2 weeks and at the present time continues to try and eat better, work on off loading and is using the air mattress at night. She saw her PCP yesterday and she has put her back on a fentanyl patch. 11/21/2015 -- for some strange reason she has been sitting in a chair with her legs dependent for the last 6 days nonstop and  has developed massive lower extremity edema and pedal edema with weeping and ulceration. Boutwell, Jya H. ([id number]) Objective Constitutional Pulse regular. Respirations normal and unlabored. Afebrile. Vitals Time Taken: 3:40 PM, Height: 65 in, Weight: 100 lbs, BMI: 16.6, Temperature: 97.7 F, Pulse: 108 bpm, Respiratory Rate: 18 breaths/min, Blood Pressure: 111/65 mmHg. Eyes Nonicteric. Reactive to light. Ears, Nose, Mouth, and Throat Lips, teeth, and gums WNL.Marland Kitchen Moist mucosa without lesions. Neck supple and nontender. No palpable supraclavicular or cervical adenopathy. Normal sized without goiter. Respiratory WNL. No retractions.. Cardiovascular Pedal Pulses WNL. No clubbing, cyanosis or edema. Lymphatic No adneopathy. No adenopathy. No adenopathy. Musculoskeletal Adexa without tenderness or enlargement.. Digits and nails w/o clubbing, cyanosis, infection, petechiae, ischemia, or inflammatory conditions.Marland Kitchen Psychiatric Judgement and insight Intact.. No evidence of depression, anxiety, or agitation.. General Notes: the lymphedema both lower extremities is completely gone down and the wounds on her heels look much better and only need form borders. The wounds on her right lower extremity and right hip need Santyl ointment and on her back we will use salt backed with hydrogel and a bordered foam. Integumentary (Hair, Skin) No suspicious lesions. No crepitus or fluctuance. No peri-wound warmth or erythema. No masses.. Wound #10 status is Open. Original cause of wound was Gradually Appeared. The wound is located on the Right,Medial Foot. The wound measures 0.5cm length x 0.5cm width x 0.4cm depth; 0.196cm^2 area and Polus, Brailyn H. ([id number]) 0.079cm^3 volume. The wound is limited to skin breakdown. There is no tunneling or undermining noted. There is a medium amount of serous drainage noted. The wound margin is flat and intact. There is medium (34-66%) pink granulation within the  wound bed. There is a medium (34-66%) amount of necrotic tissue within the wound bed including Adherent Slough. The periwound skin appearance exhibited: Moist, Erythema. The periwound skin appearance did not exhibit: Callus, Crepitus, Excoriation, Fluctuance, Friable, Induration, Localized Edema, Rash, Scarring, Dry/Scaly. The surrounding wound skin color is noted with erythema which is circumferential. Periwound temperature was noted as No Abnormality. The periwound has tenderness on palpation. Wound #11 status is Open. Original cause of wound was Pressure Injury. The wound is located on the Right,Distal Calcaneus. The wound measures 1.8cm length x 1.7cm width x 0.1cm depth; 2.403cm^2 area and 0.24cm^3 volume. The wound is limited to skin breakdown. There is no tunneling or undermining noted. There is a large amount of serous drainage noted. The wound margin is flat and intact. There is small (1-33%) pink granulation within the wound bed. There is a large (67-100%) amount of necrotic tissue within the wound bed including Adherent Slough. The periwound skin appearance exhibited: Maceration, Moist. The periwound skin appearance did not exhibit: Callus, Crepitus, Excoriation, Fluctuance, Friable, Induration, Localized Edema, Rash, Scarring, Dry/Scaly, Atrophie Blanche, Cyanosis, Ecchymosis, Hemosiderin Staining, Mottled, Pallor, Rubor, Erythema. Periwound temperature was noted as No Abnormality. The periwound has tenderness on palpation. Wound #13 status is Open. Original cause of wound was Pressure Injury. The wound is located on the Left Calcaneus. The wound measures 0.1cm  length x 0.1cm width x 0.1cm depth; 0.008cm^2 area and 0.001cm^3 volume. The wound is limited to skin breakdown. There is no tunneling or undermining noted. There is a large amount of serous drainage noted. The wound margin is flat and intact. There is no granulation within the wound bed. There is a large (67-100%) amount of  necrotic tissue within the wound bed including Eschar and Adherent Slough. The periwound skin appearance exhibited: Moist. The periwound skin appearance did not exhibit: Maceration. Periwound temperature was noted as No Abnormality. The periwound has tenderness on palpation. Wound #3 status is Open. Original cause of wound was Pressure Injury. The wound is located on the Midline Back. The wound measures 2cm length x 1.5cm width x 2cm depth; 2.356cm^2 area and 4.712cm^3 volume. The wound is limited to skin breakdown. There is a large amount of serosanguineous drainage noted. The wound margin is distinct with the outline attached to the wound base. There is medium (34-66%) red, pink granulation within the wound bed. There is a medium (34-66%) amount of necrotic tissue within the wound bed including Adherent Slough. The periwound skin appearance exhibited: Moist. The periwound skin appearance did not exhibit: Callus, Crepitus, Excoriation, Fluctuance, Friable, Induration, Localized Edema, Rash, Scarring, Dry/Scaly, Maceration, Atrophie Blanche, Cyanosis, Ecchymosis, Hemosiderin Staining, Mottled, Pallor, Rubor, Erythema. Periwound temperature was noted as No Abnormality. Wound #7 status is Open. Original cause of wound was Pressure Injury. The wound is located on the Right Ischial Tuberosity. The wound measures 0.8cm length x 0.4cm width x 0.7cm depth; 0.251cm^2 area and 0.176cm^3 volume. The wound is limited to skin breakdown. There is no tunneling or undermining noted. There is a large amount of serous drainage noted. The wound margin is distinct with the outline attached to the wound base. There is no granulation within the wound bed. There is a large (67-100%) amount of necrotic tissue within the wound bed including Adherent Slough. The periwound skin appearance exhibited: Moist. The periwound skin appearance did not exhibit: Callus, Crepitus, Excoriation, Fluctuance, Friable, Induration,  Localized Edema, Rash, Scarring, Dry/Scaly, Maceration, Atrophie Blanche, Cyanosis, Ecchymosis, Hemosiderin Staining, Mottled, Pallor, Rubor, Erythema. Periwound temperature was noted as No Abnormality. The periwound has tenderness on palpation. Adderly, Disha H. ([id number]) Wound #8 status is Open. Original cause of wound was Gradually Appeared. The wound is located on the Left Malleolus. The wound measures 1cm length x 0.9cm width x 0.1cm depth; 0.707cm^2 area and 0.071cm^3 volume. The wound is limited to skin breakdown. There is no tunneling or undermining noted. There is a large amount of serous drainage noted. The wound margin is flat and intact. There is medium (34-66%) granulation within the wound bed. There is a medium (34-66%) amount of necrotic tissue within the wound bed including Adherent Slough. The periwound skin appearance exhibited: Moist, Erythema. The periwound skin appearance did not exhibit: Callus, Crepitus, Excoriation, Fluctuance, Friable, Induration, Localized Edema, Rash, Scarring, Dry/Scaly, Maceration, Atrophie Blanche, Cyanosis, Ecchymosis, Hemosiderin Staining, Mottled, Pallor, Rubor. The surrounding wound skin color is noted with erythema which is circumferential. Periwound temperature was noted as No Abnormality. The periwound has tenderness on palpation. Wound #9 status is Open. Original cause of wound was Gradually Appeared. The wound is located on the Left Ischial Tuberosity. The wound measures 0.5cm length x 0.3cm width x 0.1cm depth; 0.118cm^2 area and 0.012cm^3 volume. The wound is limited to skin breakdown. There is no tunneling or undermining noted. There is a medium amount of serous drainage noted. The wound margin is flat and intact. There is  no granulation within the wound bed. There is a large (67-100%) amount of necrotic tissue within the wound bed including Adherent Slough. The periwound skin appearance exhibited: Moist. The periwound skin appearance  did not exhibit: Callus, Crepitus, Excoriation, Fluctuance, Friable, Induration, Localized Edema, Rash, Scarring, Dry/Scaly, Maceration, Atrophie Blanche, Cyanosis, Ecchymosis, Hemosiderin Staining, Mottled, Pallor, Rubor, Erythema. Periwound temperature was noted as No Abnormality. The periwound has tenderness on palpation. 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 Cleanse wound with mild soap and water Llanas, Gladies H. ([id number]) Wound #11 Right,Distal Calcaneus: Clean wound with Normal Saline. Cleanse wound with mild soap and water Wound #13 Left Calcaneus: Clean wound with Normal Saline. Cleanse wound with mild soap and water Wound #3 Midline Back: Clean wound with Normal Saline. Cleanse wound with mild soap and water Wound #7 Right Ischial Tuberosity: Clean wound with Normal Saline. Cleanse wound with mild soap and water Wound #8 Left Malleolus: Clean wound with Normal Saline. Cleanse wound with mild soap and water Wound #9 Left Ischial Tuberosity: Clean wound with Normal Saline. Cleanse wound with mild soap and water Anesthetic: Wound #10 Right,Medial Foot: Topical Lidocaine 4% cream applied to wound bed prior to debridement Wound #11 Right,Distal Calcaneus: Topical Lidocaine 4% cream applied to wound bed prior to debridement Wound #13 Left Calcaneus: Topical Lidocaine 4% cream applied to wound bed prior to debridement Wound #3 Midline Back: Topical Lidocaine 4% cream applied to  wound bed prior to debridement Wound #7 Right Ischial Tuberosity: Topical Lidocaine 4% cream applied to wound bed prior to debridement Wound #8 Left Malleolus: Topical Lidocaine 4% cream applied to wound bed prior to debridement Wound #9 Left Ischial Tuberosity: Topical Lidocaine 4% cream applied to wound bed prior to debridement Skin Barriers/Peri-Wound Care: Wound #10 Right,Medial Foot: Skin Prep Wound #11 Right,Distal Calcaneus: Skin Prep Wound #13 Left Calcaneus: Skin Prep Wound #3 Midline Back: Skin Prep Wound #7 Right Ischial Tuberosity: Skin Prep Wound #8 Left Malleolus: Skin Prep Wound #9 Left Ischial Tuberosity: Skin Prep Primary Wound Dressing: Wound #10 Right,Medial Foot: Santyl Ointment Claxton, Rolla H. ([id number]) Wound #11 Right,Distal Calcaneus: Santyl Ointment Wound #13 Left Calcaneus: Other: - BFD Wound #3 Midline Back: Other: - Sorbact with hydrogel Wound #7 Right Ischial Tuberosity: Santyl Ointment Plain packing gauze - 1/4 inch Wound #8 Left Malleolus: Santyl Ointment Wound #9 Left Ischial Tuberosity: Other: - BFD Secondary Dressing: Wound #10 Right,Medial Foot: Boardered Foam Dressing Wound #11 Right,Distal Calcaneus: Boardered Foam Dressing Wound #13 Left Calcaneus: Boardered Foam Dressing Wound #3 Midline Back: Boardered Foam Dressing Wound #7 Right Ischial Tuberosity: Boardered Foam Dressing Wound #8 Left Malleolus: Boardered Foam Dressing Wound #9 Left Ischial Tuberosity: Boardered Foam Dressing Dressing Change Frequency: Wound #10 Right,Medial Foot: Change dressing every day. Wound #11 Right,Distal Calcaneus: Change dressing every day. Wound #13 Left Calcaneus: Change dressing every day. Wound #3 Midline Back: Change dressing every day. Wound #7 Right Ischial Tuberosity: Change dressing every day. Wound #8 Left Malleolus: Change dressing every day. Wound #9 Left Ischial Tuberosity: Change dressing every day. Follow-up  Appointments: Wound #10 Right,Medial Foot:  Return Appointment in 2 weeks. Wound #11 Right,Distal Calcaneus: Return Appointment in 2 weeks. Wound #13 Left Calcaneus: Return Appointment in 2 weeks. Wound #3 Midline Back: [place], Skylene H. ([id number]) Return Appointment in 2 weeks. Wound #7 Right Ischial Tuberosity: Return Appointment in 2 weeks. Wound #8 Left Malleolus: Return Appointment in 2 weeks. Wound #9 Left Ischial Tuberosity: Return Appointment in 2 weeks. Edema Control: Elevate legs to the level of the heart and pump ankles as often as possible Off-Loading: Wound #10 Right,Medial Foot: Turn and reposition every 2 hours Other: - float heels while in the bed Wound #11 Right,Distal Calcaneus: Turn and reposition every 2 hours Other: - float heels while in the bed Wound #13 Left Calcaneus: Turn and reposition every 2 hours Other: - float heels while in the bed Wound #3 Midline Back: Turn and reposition every 2 hours Other: - float heels while in the bed Wound #7 Right Ischial Tuberosity: Turn and reposition every 2 hours Other: - float heels while in the bed Wound #8 Left Malleolus: Turn and reposition every 2 hours Other: - float heels while in the bed Wound #9 Left Ischial Tuberosity: Turn and reposition every 2 hours Other: - float heels while in the bed Home Health: Wound #10 Right,Medial Foot: [place] Visits - [facility] Nurse may visit PRN to address patient s wound care needs. FACE TO FACE ENCOUNTER: MEDICARE and MEDICAID PATIENTS: I certify that this patient is under my care and that I had a face-to-face encounter that meets the physician face-to-face encounter requirements with this patient on this date. The encounter with the patient was in whole or in part for the following MEDICAL CONDITION: (primary reason for [place]) MEDICAL NECESSITY: I certify, that based on my findings, NURSING services are a medically necessary home  health service. HOME BOUND STATUS: I certify that my clinical findings support that this patient is homebound (i.e., Due to illness or injury, pt requires aid of supportive devices such as crutches, cane, wheelchairs, walkers, the use of special transportation or the assistance of another person to leave their place of residence. There is a normal inability to leave the home and doing so requires considerable and taxing effort. Other absences are for medical reasons / religious services and are infrequent or of short duration when for other reasons). If current dressing causes regression in wound condition, may D/C ordered dressing product/s and apply Normal Saline Moist Dressing daily until next [place] / Other MD appointment. [place] of regression in wound condition at [phone]. Please direct any NON-WOUND related issues/requests for orders to patient's Primary Care Physician Wound #11 Right,Distal Calcaneus: [place] [facility] - Inis Granado, Jaleisa H. ([id number]) Home Health Nurse may visit PRN to address patient s wound care needs. FACE TO FACE ENCOUNTER: MEDICARE and MEDICAID PATIENTS: I certify that this patient is under my care and that I had a face-to-face encounter that meets the physician face-to-face encounter requirements with this patient on this date. The encounter with the patient was in whole or in part for the following MEDICAL CONDITION: (primary reason for [place]) MEDICAL NECESSITY: I certify, that based on my findings, NURSING services are a medically necessary home health service. HOME BOUND STATUS: I certify that my clinical findings support that this patient is homebound (i.e., Due to illness or injury, pt requires aid of supportive devices such as crutches, cane, wheelchairs, walkers, the use of special transportation or the assistance of another person  to leave their place of residence. There is a normal inability  to leave the home and doing so requires considerable and taxing effort. Other absences are for medical reasons / religious services and are infrequent or of short duration when for other reasons). If current dressing causes regression in wound condition, may D/C ordered dressing product/s and apply Normal Saline Moist Dressing daily until next [place] / Other MD appointment. [place] of regression in wound condition at [phone]. Please direct any NON-WOUND related issues/requests for orders to patient's Primary Care Physician Wound #13 Left Calcaneus: [place] Visits - [facility] Nurse may visit PRN to address patient s wound care needs. FACE TO FACE ENCOUNTER: MEDICARE and MEDICAID PATIENTS: I certify that this patient is under my care and that I had a face-to-face encounter that meets the physician face-to-face encounter requirements with this patient on this date. The encounter with the patient was in whole or in part for the following MEDICAL CONDITION: (primary reason for [place]) MEDICAL NECESSITY: I certify, that based on my findings, NURSING services are a medically necessary home health service. HOME BOUND STATUS: I certify that my clinical findings support that this patient is homebound (i.e., Due to illness or injury, pt requires aid of supportive devices such as crutches, cane, wheelchairs, walkers, the use of special transportation or the assistance of another person to leave their place of residence. There is a normal inability to leave the home and doing so requires considerable and taxing effort. Other absences are for medical reasons / religious services and are infrequent or of short duration when for other reasons). If current dressing causes regression in wound condition, may D/C ordered dressing product/s and apply Normal Saline Moist Dressing daily until next [place] / Other MD appointment. [place] of regression in wound condition at [phone]. Please direct any NON-WOUND related issues/requests for orders to patient's Primary Care Physician Wound #3 Midline Back: [place] Visits - [facility] Nurse may visit PRN to address patient s wound care needs. FACE TO FACE ENCOUNTER: MEDICARE and MEDICAID PATIENTS: I certify that this patient is under my care and that I had a face-to-face encounter that meets the physician face-to-face encounter requirements with this patient on this date. The encounter with the patient was in whole or in part for the following MEDICAL CONDITION: (primary reason for [place]) MEDICAL NECESSITY: I certify, that based on my findings, NURSING services are a medically necessary home health service. HOME BOUND STATUS: I certify that my clinical findings support that this patient is homebound (i.e., Due to illness or injury, pt requires aid of supportive devices such as crutches, cane, wheelchairs, walkers, the use of special transportation or the assistance of another person to leave their place of residence. There is a normal inability to leave the home and doing so requires considerable and taxing effort. Other absences are for medical reasons / religious services and are infrequent or of short duration when for other reasons). If current dressing causes regression in wound condition, may D/C ordered dressing product/s and apply Normal Saline Moist Dressing daily until next [place] / Other MD appointment. [place] of regression in wound condition at [phone]. Please direct any NON-WOUND related issues/requests for orders to patient's Primary Care Physician Wound #7 Right Ischial Tuberosity: [place] Visits - Shamar Holand, [place]. ([id number]) Home Health Nurse may visit PRN to address patient  s wound care needs. FACE TO FACE ENCOUNTER: MEDICARE and MEDICAID PATIENTS:  I certify that this patient is under my care and that I had a face-to-face encounter that meets the physician face-to-face encounter requirements with this patient on this date. The encounter with the patient was in whole or in part for the following MEDICAL CONDITION: (primary reason for [place]) MEDICAL NECESSITY: I certify, that based on my findings, NURSING services are a medically necessary home health service. HOME BOUND STATUS: I certify that my clinical findings support that this patient is homebound (i.e., Due to illness or injury, pt requires aid of supportive devices such as crutches, cane, wheelchairs, walkers, the use of special transportation or the assistance of another person to leave their place of residence. There is a normal inability to leave the home and doing so requires considerable and taxing effort. Other absences are for medical reasons / religious services and are infrequent or of short duration when for other reasons). If current dressing causes regression in wound condition, may D/C ordered dressing product/s and apply Normal Saline Moist Dressing daily until next [place] / Other MD appointment. [place] of regression in wound condition at [phone]. Please direct any NON-WOUND related issues/requests for orders to patient's Primary Care Physician Wound #8 Left Malleolus: [place] Visits - [facility] Nurse may visit PRN to address patient s wound care needs. FACE TO FACE ENCOUNTER: MEDICARE and MEDICAID PATIENTS: I certify that this patient is under my care and that I had a face-to-face encounter that meets the physician face-to-face encounter requirements with this patient on this date. The encounter with the patient was in whole or in part for the following MEDICAL CONDITION: (primary reason for [place]) MEDICAL NECESSITY: I certify, that based on my findings, NURSING services are a medically  necessary home health service. HOME BOUND STATUS: I certify that my clinical findings support that this patient is homebound (i.e., Due to illness or injury, pt requires aid of supportive devices such as crutches, cane, wheelchairs, walkers, the use of special transportation or the assistance of another person to leave their place of residence. There is a normal inability to leave the home and doing so requires considerable and taxing effort. Other absences are for medical reasons / religious services and are infrequent or of short duration when for other reasons). If current dressing causes regression in wound condition, may D/C ordered dressing product/s and apply Normal Saline Moist Dressing daily until next [place] / Other MD appointment. [place] of regression in wound condition at [phone]. Please direct any NON-WOUND related issues/requests for orders to patient's Primary Care Physician Wound #9 Left Ischial Tuberosity: [place] Visits - [facility] Nurse may visit PRN to address patient s wound care needs. FACE TO FACE ENCOUNTER: MEDICARE and MEDICAID PATIENTS: I certify that this patient is under my care and that I had a face-to-face encounter that meets the physician face-to-face encounter requirements with this patient on this date. The encounter with the patient was in whole or in part for the following MEDICAL CONDITION: (primary reason for [place]) MEDICAL NECESSITY: I certify, that based on my findings, NURSING services are a medically necessary home health service. HOME BOUND STATUS: I certify that my clinical findings support that this patient is homebound (i.e., Due to illness or injury, pt requires aid of supportive devices such as crutches, cane, wheelchairs, walkers, the use of special transportation or  the assistance of another person to leave their place of residence. There is a normal inability to leave the  home and doing so requires considerable and taxing effort. Other absences are for medical reasons / religious services and are infrequent or of short duration when for other reasons). If current dressing causes regression in wound condition, may D/C ordered dressing product/s and apply Normal Saline Moist Dressing daily until next [place] / Other MD appointment. [place] of regression in wound condition at [phone]. Please direct any NON-WOUND related issues/requests for orders to patient's Primary Care Physician [place], Aalyah H. ([id number]) Overall she's made good progress and the lymphedema both lower extremities is completely gone down and the wounds on her heels look much better and only need form borders. The wounds on her right lower extremity and right hip need Santyl ointment and on her back we will use Sorbact with hydrogel and a bordered foam. Nutrition and vitamin supplements have been discussed. She will come back to see as next week Electronic Signature(s) Signed: 12/12/2015 4:32:59 PM By: Christin Fudge MD, FACS Entered By: Christin Fudge on 12/12/2015 16:32:59 [place], Avah H. ([id number]) -------------------------------------------------------------------------------- SuperBill Details Patient Name: [name], Moe H. Date of Service: 12/12/2015 Medical Record Number: [id number] Patient Account Number: [id number] Date of Birth/Sex: Jun 18, 1946 (70 y.o. 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, stage 3 F17.218 Nicotine dependence, cigarettes, with other nicotine-induced disorders L89.100 Pressure ulcer of unspecified part of back, unstageable L89.213  Pressure ulcer of right hip, stage 3 I89.0 Lymphedema, not elsewhere classified L97.211 Non-pressure chronic ulcer of right calf limited to breakdown of skin Facility Procedures CPT4 Code: [id number] Description: [id number] - WOUND CARE VISIT-LEV 5 EST PT Modifier: Quantity: 1 Physician Procedures CPT4 Code Description: [id number] - WC PHYS LEVEL 3 - EST PT ICD-10 Description Diagnosis 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

## 2015-12-19 ENCOUNTER — Encounter: Payer: Medicare Other | Attending: Surgery | Admitting: Surgery

## 2015-12-19 DIAGNOSIS — L89100 Pressure ulcer of unspecified part of back, unstageable: Secondary | ICD-10-CM | POA: Diagnosis not present

## 2015-12-19 DIAGNOSIS — I890 Lymphedema, not elsewhere classified: Secondary | ICD-10-CM | POA: Insufficient documentation

## 2015-12-19 DIAGNOSIS — L89213 Pressure ulcer of right hip, stage 3: Secondary | ICD-10-CM | POA: Insufficient documentation

## 2015-12-19 DIAGNOSIS — E1140 Type 2 diabetes mellitus with diabetic neuropathy, unspecified: Secondary | ICD-10-CM | POA: Diagnosis not present

## 2015-12-19 DIAGNOSIS — L97211 Non-pressure chronic ulcer of right calf limited to breakdown of skin: Secondary | ICD-10-CM | POA: Diagnosis not present

## 2015-12-19 DIAGNOSIS — Z853 Personal history of malignant neoplasm of breast: Secondary | ICD-10-CM | POA: Diagnosis not present

## 2015-12-19 DIAGNOSIS — L89613 Pressure ulcer of right heel, stage 3: Secondary | ICD-10-CM | POA: Insufficient documentation

## 2015-12-19 DIAGNOSIS — I10 Essential (primary) hypertension: Secondary | ICD-10-CM | POA: Insufficient documentation

## 2015-12-19 DIAGNOSIS — F17218 Nicotine dependence, cigarettes, with other nicotine-induced disorders: Secondary | ICD-10-CM | POA: Insufficient documentation

## 2015-12-19 DIAGNOSIS — M810 Age-related osteoporosis without current pathological fracture: Secondary | ICD-10-CM | POA: Insufficient documentation

## 2015-12-19 DIAGNOSIS — F1021 Alcohol dependence, in remission: Secondary | ICD-10-CM | POA: Diagnosis not present

## 2015-12-19 DIAGNOSIS — E11621 Type 2 diabetes mellitus with foot ulcer: Secondary | ICD-10-CM | POA: Diagnosis present

## 2015-12-20 NOTE — Progress Notes (Addendum)
[name], [name] Visit Report for 12/19/2015 Arrival Information Details Patient Name: [name], [name] Date of Service: 12/19/2015 3:00 PM Medical Record Number: [id number] Patient Account Number: [id number] Date of Birth/Sex: Mar 08, 1946 (70 y.o. Female) Treating RN: Macarthur Critchley Primary Care Physician: Paulita Cradle Other Clinician: Referring Physician: Paulita Cradle Treating Physician/Extender: Frann Rider in Treatment: 38 Visit Information History Since Last Visit All ordered tests and consults were completed: No Patient Arrived: Wheel Chair Added or deleted any medications: No Arrival Time: 15:10 Any new allergies or adverse reactions: No Accompanied By: husband Had a fall or experienced change in No activities of daily living that may affect Transfer Assistance: None risk of falls: Patient Identification Verified: Yes Signs or symptoms of abuse/neglect since last No Secondary Verification Process Yes visito Completed: Hospitalized since last visit: No Patient Requires Transmission-Based No Has Dressing in Place as Prescribed: Yes Precautions: Pain Present Now: No Patient Has Alerts: No Electronic Signature(s) Signed: 12/19/2015 4:55:36 PM By: Rebecca Eaton, RN, Sendra Entered By: Rebecca Eaton RN, Sendra on 12/19/2015 15:11:18 [name], [name] -------------------------------------------------------------------------------- Clinic Level of Care Assessment Details Patient Name: [name], [name] Date of Service: 12/19/2015 3:00 PM Medical Record Number: [id number] Patient Account Number: [id number] Date of Birth/Sex: [birth year] (70 y.o. Female) Treating RN: Macarthur Critchley Primary Care Physician: Paulita Cradle Other Clinician: Referring Physician: Paulita Cradle Treating Physician/Extender: Frann Rider in Treatment: 74 Clinic Level of Care Assessment Items TOOL 4 Quantity Score X - Use when only an EandM is performed on  FOLLOW-UP visit 1 0 ASSESSMENTS - Nursing Assessment / Reassessment X - Reassessment of Co-morbidities (includes updates in patient status) 1 10 X - Reassessment of Adherence to Treatment Plan 1 5 ASSESSMENTS - Wound and Skin Assessment / Reassessment []  - Simple Wound Assessment / Reassessment - one wound 0 X - Complex Wound Assessment / Reassessment - multiple wounds 7 5 []  - Dermatologic / Skin Assessment (not related to wound area) 0 ASSESSMENTS - Focused Assessment []  - Circumferential Edema Measurements - multi extremities 0 []  - Nutritional Assessment / Counseling / Intervention 0 []  - Lower Extremity Assessment (monofilament, tuning fork, pulses) 0 X - Peripheral Arterial Disease Assessment (using hand held doppler) 1 10 ASSESSMENTS - Ostomy and/or Continence Assessment and Care []  - Incontinence Assessment and Management 0 []  - Ostomy Care Assessment and Management (repouching, etc.) 0 PROCESS - Coordination of Care X - Simple Patient / Family Education for ongoing care 1 15 []  - Complex (extensive) Patient / Family Education for ongoing care 0 X - Staff obtains Programmer, systems, Records, Test Results / Process Orders 1 10 X - Staff telephones HHA, Nursing Homes / Clarify orders / etc 1 10 []  - Routine Transfer to another Facility (non-emergent condition) 0 [name], [name] []  - Routine Hospital Admission (non-emergent condition) 0 []  - New Admissions / Biomedical engineer / Ordering NPWT, Apligraf, etc. 0 []  - Emergency Hospital Admission (emergent condition) 0 X - Simple Discharge Coordination 1 10 []  - Complex (extensive) Discharge Coordination 0 PROCESS - Special Needs []  - Pediatric / Minor Patient Management 0 []  - Isolation Patient Management 0 []  - Hearing / Language / Visual special needs 0 []  - Assessment of Community assistance (transportation, D/C planning, etc.) 0 []  - Additional assistance / Altered mentation 0 []  - Support Surface(s) Assessment (bed,  cushion, seat, etc.) 0 INTERVENTIONS - Wound Cleansing / Measurement []  - Simple Wound Cleansing - one wound 0 X - Complex Wound Cleansing - multiple wounds 6 5  X - Wound Imaging (photographs - any number of wounds) 1 5 []  - Wound Tracing (instead of photographs) 0 []  - Simple Wound Measurement - one wound 0 X - Complex Wound Measurement - multiple wounds 6 5 INTERVENTIONS - Wound Dressings X - Small Wound Dressing one or multiple wounds 6 10 []  - Medium Wound Dressing one or multiple wounds 0 []  - Large Wound Dressing one or multiple wounds 0 X - Application of Medications - topical 1 5 []  - Application of Medications - injection 0 INTERVENTIONS - Miscellaneous []  - External ear exam 0 [name], [name] []  - Specimen Collection (cultures, biopsies, blood, body fluids, etc.) 0 []  - Specimen(s) / Culture(s) sent or taken to Lab for analysis 0 []  - Patient Transfer (multiple staff / [name] Lift / Similar devices) 0 []  - Simple Staple / Suture removal (25 or less) 0 []  - Complex Staple / Suture removal (26 or more) 0 []  - Hypo / Hyperglycemic Management (close monitor of Blood Glucose) 0 []  - Ankle / Brachial Index (ABI) - do not check if billed separately 0 X - Vital Signs 1 5 Has the patient been seen at the hospital within the last three years: Yes Total Score: 240 Level Of Care: New/Established - Level 5 Electronic Signature(s) Signed: 12/22/2015 4:17:15 PM By: Rebecca Eaton, RN, Sendra Entered By: Rebecca Eaton RN, Sendra on 12/22/2015 09:42:37 [name], [name] -------------------------------------------------------------------------------- Encounter Discharge Information Details Patient Name: [name], [name] Date of Service: 12/19/2015 3:00 PM Medical Record Number: [id number] Patient Account Number: [id number] Date of Birth/Sex: [birth year] (70 y.o. Female) Treating RN: Macarthur Critchley Primary Care Physician: Paulita Cradle Other Clinician: Referring Physician:  Paulita Cradle Treating Physician/Extender: Frann Rider in Treatment: 42 Encounter Discharge Information Items Facility Notification Discharge Pain Level: 0 Facility Type: Home Health Discharge Condition: Stable Orders Sent: Yes Ambulatory Status: Wheelchair Discharge Destination: Home Transportation: Private Auto Accompanied By: spouse Schedule Follow-up Appointment: Yes Medication Reconciliation completed and provided to Patient/Care Yes Randale Carvalho: Provided on Clinical Summary of Care: 12/19/2015 Form Type Recipient Paper Patient AS Electronic Signature(s) Signed: 12/19/2015 4:07:57 PM By: Ruthine Dose Entered By: Ruthine Dose on 12/19/2015 16:07:57 [name], [name] -------------------------------------------------------------------------------- Lower Extremity Assessment Details Patient Name: Bricco, Amyri [name]. Date of Service: 12/19/2015 3:00 PM Medical Record Number: [id number] Patient Account Number: [id number] Date of Birth/Sex: Feb 07, 1946 (70 y.o. Female) Treating RN: Macarthur Critchley Primary Care Physician: Paulita Cradle Other Clinician: Referring Physician: Paulita Cradle Treating Physician/Extender: Frann Rider in Treatment: 40 Vascular Assessment Pulses: Posterior Tibial Dorsalis Pedis Palpable: [Left:Yes] [Right:Yes] Electronic Signature(s) Signed: 12/19/2015 4:55:36 PM By: Rebecca Eaton RN, Sendra Entered By: Rebecca Eaton RN, Sendra on 12/19/2015 15:31:51 Meldrum, Ludia [name]. ([id number]) -------------------------------------------------------------------------------- Pain Assessment Details Patient Name: Linnen, Leonilda [name]. Date of Service: 12/19/2015 3:00 PM Medical Record Number: [id number] Patient Account Number: [id number] Date of Birth/Sex: Jan 25, 1946 (70 y.o. Female) Treating RN: Macarthur Critchley Primary Care Physician: Paulita Cradle Other Clinician: Referring Physician: Paulita Cradle Treating Physician/Extender: Frann Rider in Treatment: 40 Active Problems Location of Pain Severity and Description of Pain Patient Has Paino No Site Locations Rate the pain. Current Pain Level: 6 Pain Management and Medication Current Pain Management: Medication: Yes Electronic Signature(s) Signed: 12/19/2015 4:55:36 PM By: Rebecca Eaton, RN, Sendra Entered By: Rebecca Eaton RN, Sendra on 12/19/2015 15:13:10 [name], [name] -------------------------------------------------------------------------------- Patient/Caregiver Education Details Patient Name: [name], Nina [name]. Date of Service: 12/19/2015 3:00 PM Medical Record Number: [id number] Patient Account Number: [id number] Date of Birth/Gender: [birth year] (70 y.o. Female) Treating  RN: Macarthur Critchley Primary Care Physician: Paulita Cradle Other Clinician: Referring Physician: Paulita Cradle Treating Physician/Extender: Frann Rider in Treatment: 53 Education Assessment Education Provided To: Patient and Caregiver dale Education Topics Provided Wound/Skin Impairment: Handouts: Caring for Your Ulcer, Skin Care Do's and Dont's Methods: Explain/Verbal Responses: State content correctly Electronic Signature(s) Signed: 12/19/2015 4:55:36 PM By: Rebecca Eaton, RN, Sendra Entered By: Rebecca Eaton RN, Sendra on 12/19/2015 15:34:13 [name], [name] -------------------------------------------------------------------------------- Wound Assessment Details Patient Name: [name], [name] Date of Service: 12/19/2015 3:00 PM Medical Record Number: [id number] Patient Account Number: [id number] Date of Birth/Sex: 1946/05/01 (70 y.o. Female) Treating RN: Macarthur Critchley Primary Care Physician: Paulita Cradle Other Clinician: Referring Physician: Paulita Cradle Treating Physician/Extender: Frann Rider in Treatment: 40 Wound Status Wound Number: 10 Primary Pressure Ulcer Etiology: Wound Location: Right Foot - Medial Wound Open Wounding  Event: Gradually Appeared Status: Date Acquired: 09/12/2015 Comorbid Cataracts, Asthma, Hypertension, Weeks Of Treatment: 13 History: Type II Diabetes, Neuropathy, Clustered Wound: No Received Chemotherapy Photos Photo Uploaded By: Rebecca Eaton RN, Roslynn Amble on 12/19/2015 16:52:34 Wound Measurements Length: (cm) 0.5 Width: (cm) 0.5 Depth: (cm) 0.2 Area: (cm) 0.196 Volume: (cm) 0.039 % Reduction in Area: 16.9% % Reduction in Volume: -62.5% Epithelialization: None Tunneling: No Undermining: No Wound Description Classification: Category/Stage II Foul Odor A Diabetic Severity (Wagner): Grade 1 Wound Margin: Flat and Intact Exudate Amount: Medium Exudate Type: Serous Exudate Color: amber fter Cleansing: No Wound Bed Granulation Amount: Medium (34-66%) Exposed Structure Granulation Quality: Pink Fascia Exposed: No Necrotic Amount: Medium (34-66%) Fat Layer Exposed: No Arzola, Tacara [name]. ([id number]) Necrotic Quality: Adherent Slough Tendon Exposed: No Muscle Exposed: No Joint Exposed: No Bone Exposed: No Limited to Skin Breakdown Periwound Skin Texture Texture Color No Abnormalities Noted: No No Abnormalities Noted: No Callus: No Erythema: Yes Crepitus: No Erythema Location: Circumferential Excoriation: No Temperature / Pain Fluctuance: No Temperature: No Abnormality Friable: No Tenderness on Palpation: Yes Induration: No Localized Edema: No Rash: No Scarring: No Moisture No Abnormalities Noted: No Dry / Scaly: No Moist: Yes Wound Preparation Ulcer Cleansing: Rinsed/Irrigated with Saline Topical Anesthetic Applied: Other: lidocaine 4%, Treatment Notes Wound #10 (Right, Medial Foot) 1. Cleansed with: Clean wound with Normal Saline 2. Anesthetic Topical Lidocaine 4% cream to wound bed prior to debridement 4. Dressing Applied: Aquacel Ag 5. Secondary Dressing Applied Bordered Foam Dressing Electronic Signature(s) Signed: 12/19/2015 4:55:36 PM By: Rebecca Eaton  RN, Sendra Entered By: Rebecca Eaton RN, Sendra on 12/19/2015 15:19:08 Tamm, [place]. ([id number]) -------------------------------------------------------------------------------- Wound Assessment Details Patient Name: [name], [name] Date of Service: 12/19/2015 3:00 PM Medical Record Number: [id number] Patient Account Number: [id number] Date of Birth/Sex: 06/07/1946 (70 y.o. Female) Treating RN: Macarthur Critchley Primary Care Physician: Paulita Cradle Other Clinician: Referring Physician: Paulita Cradle Treating Physician/Extender: Frann Rider in Treatment: 40 Wound Status Wound Number: 11 Primary Pressure Ulcer Etiology: Wound Location: Right Calcaneus - Distal Wound Open Wounding Event: Pressure Injury Status: Date Acquired: 09/29/2015 Comorbid Cataracts, Asthma, Hypertension, Weeks Of Treatment: 11 History: Type II Diabetes, Neuropathy, Clustered Wound: No Received Chemotherapy Photos Photo Uploaded By: Rebecca Eaton RN, Roslynn Amble on 12/19/2015 16:52:35 Wound Measurements Length: (cm) 1.6 Width: (cm) 1 Depth: (cm) 0.1 Area: (cm) 1.257 Volume: (cm) 0.126 % Reduction in Area: -77.8% % Reduction in Volume: -77.5% Epithelialization: None Tunneling: No Undermining: No Wound Description Classification: Category/Stage II Foul Odor A Diabetic Severity (Wagner): Grade 1 Wound Margin: Flat and Intact Exudate Amount: Large Exudate Type: Serous Exudate Color: amber fter Cleansing: No Wound Bed Granulation Amount: Small (1-33%) Exposed Structure  Granulation Quality: Pink Fascia Exposed: No Necrotic Amount: Large (67-100%) Fat Layer Exposed: No Vanmaanen, Yasmene [name]. ([id number]) Necrotic Quality: Adherent Slough Tendon Exposed: No Muscle Exposed: No Joint Exposed: No Bone Exposed: No Limited to Skin Breakdown Periwound Skin Texture Texture Color No Abnormalities Noted: No No Abnormalities Noted: No Callus: No Atrophie Blanche: No Crepitus: No Cyanosis:  No Excoriation: No Ecchymosis: No Fluctuance: No Erythema: No Friable: No Hemosiderin Staining: No Induration: No Mottled: No Localized Edema: No Pallor: No Rash: No Rubor: No Scarring: No Temperature / Pain Moisture Temperature: No Abnormality No Abnormalities Noted: No Tenderness on Palpation: Yes Dry / Scaly: No Maceration: Yes Moist: Yes Wound Preparation Ulcer Cleansing: Rinsed/Irrigated with Saline Topical Anesthetic Applied: Other: lidocaine 4%, Treatment Notes Wound #11 (Right, Distal Calcaneus) 1. Cleansed with: Clean wound with Normal Saline 2. Anesthetic Topical Lidocaine 4% cream to wound bed prior to debridement 4. Dressing Applied: Aquacel Ag 5. Secondary Dressing Applied Bordered Foam Dressing Electronic Signature(s) Signed: 12/19/2015 4:55:36 PM By: Rebecca Eaton RN, Sendra Entered By: Rebecca Eaton RN, Sendra on 12/19/2015 15:19:17 Kunkler, Catrina [name]. ([id number]) -------------------------------------------------------------------------------- Wound Assessment Details Patient Name: [name], [name] Date of Service: 12/19/2015 3:00 PM Medical Record Number: [id number] Patient Account Number: [id number] Date of Birth/Sex: July 03, 1946 (70 y.o. Female) Treating RN: Macarthur Critchley Primary Care Physician: Paulita Cradle Other Clinician: Referring Physician: Paulita Cradle Treating Physician/Extender: Frann Rider in Treatment: 40 Wound Status Wound Number: 13 Primary Pressure Ulcer Etiology: Wound Location: Left Calcaneus Wound Healed - Epithelialized Wounding Event: Pressure Injury Status: Date Acquired: 11/02/2015 Comorbid Cataracts, Asthma, Hypertension, Weeks Of Treatment: 6 History: Type II Diabetes, Neuropathy, Clustered Wound: No Received Chemotherapy Photos Photo Uploaded By: Rebecca Eaton RN, Roslynn Amble on 12/19/2015 16:53:08 Wound Measurements Length: (cm) 0 % Reduction in Width: (cm) 0 % Reduction in Depth: (cm) 0 Epithelializati Area:  (cm) 0 Volume: (cm) 0 Area: 100% Volume: 100% on: None Wound Description Classification: Category/Stage II Diabetic Severity Earleen Newport): Grade 1 Wound Margin: Flat and Intact Exudate Amount: Large Exudate Type: Serous Exudate Color: amber Foul Odor After Cleansing: No Wound Bed Granulation Amount: None Present (0%) Exposed Structure Necrotic Amount: None Present (0%) Fascia Exposed: No Fat Layer Exposed: No Seki, Catrina [name]. ([id number]) Tendon Exposed: No Muscle Exposed: No Joint Exposed: No Bone Exposed: No Limited to Skin Breakdown Periwound Skin Texture Texture Color No Abnormalities Noted: No No Abnormalities Noted: No Moisture Temperature / Pain No Abnormalities Noted: No Temperature: No Abnormality Maceration: No Moist: Yes Wound Preparation Ulcer Cleansing: Rinsed/Irrigated with Saline Electronic Signature(s) Signed: 12/19/2015 4:55:36 PM By: Rebecca Eaton, RN, Sendra Entered By: Rebecca Eaton RN, Sendra on 12/19/2015 15:19:33 [name], [name] ([id number]) -------------------------------------------------------------------------------- Wound Assessment Details Patient Name: [name], [name] Date of Service: 12/19/2015 3:00 PM Medical Record Number: [id number] Patient Account Number: [id number] Date of Birth/Sex: 09-06-1946 (70 y.o. Female) Treating RN: Macarthur Critchley Primary Care Physician: Paulita Cradle Other Clinician: Referring Physician: Paulita Cradle Treating Physician/Extender: Frann Rider in Treatment: 40 Wound Status Wound Number: 3 Primary Pressure Ulcer Etiology: Wound Location: Back - Midline Wound Open Wounding Event: Pressure Injury Status: Date Acquired: 05/02/2015 Comorbid Cataracts, Asthma, Hypertension, Weeks Of Treatment: 33 History: Type II Diabetes, Neuropathy, Clustered Wound: No Received Chemotherapy Photos Photo Uploaded By: Alric Quan on 12/19/2015 16:42:21 Wound Measurements Length: (cm) 2 Width: (cm)  1.9 Depth: (cm) 0.8 Area: (cm) 2.985 Volume: (cm) 2.388 % Reduction in Area: -1256.8% % Reduction in Volume: -10754.5% Epithelialization: None Tunneling: No Undermining: No Wound Description Classification: Category/Stage II Wound Margin: Distinct, outline attached Exudate Amount:  Large Exudate Type: Serosanguineous Exudate Color: red, brown Foul Odor After Cleansing: No Wound Bed Granulation Amount: Medium (34-66%) Exposed Structure Granulation Quality: Red, Pink Fascia Exposed: No Necrotic Amount: Medium (34-66%) Fat Layer Exposed: No Necrotic Quality: Adherent Slough Tendon Exposed: No Sesler, Rayah [name]. ([id number]) Muscle Exposed: No Joint Exposed: No Bone Exposed: No Limited to Skin Breakdown Periwound Skin Texture Texture Color No Abnormalities Noted: No No Abnormalities Noted: No Callus: No Atrophie Blanche: No Crepitus: No Cyanosis: No Excoriation: No Ecchymosis: No Fluctuance: No Erythema: No Friable: No Hemosiderin Staining: No Induration: No Mottled: No Localized Edema: No Pallor: No Rash: No Rubor: No Scarring: No Temperature / Pain Moisture Temperature: No Abnormality No Abnormalities Noted: No Dry / Scaly: No Maceration: No Moist: Yes Wound Preparation Ulcer Cleansing: Rinsed/Irrigated with Saline Topical Anesthetic Applied: Other: lidocaine 4%, Treatment Notes Wound #3 (Midline Back) 1. Cleansed with: Clean wound with Normal Saline 2. Anesthetic Topical Lidocaine 4% cream to wound bed prior to debridement 5. Secondary Dressing Applied Bordered Foam Dressing Notes sorbact / hydrogel Electronic Signature(s) Signed: 12/19/2015 4:55:36 PM By: Rebecca Eaton, RN, Sendra Entered By: Rebecca Eaton RN, Sendra on 12/19/2015 15:25:25 Gheen, Enijah [name]. ([id number]) -------------------------------------------------------------------------------- Wound Assessment Details Patient Name: Borello, Mai [name]. Date of Service: 12/19/2015 3:00 PM Medical Record  Number: [id number] Patient Account Number: [id number] Date of Birth/Sex: 23-Mar-1946 (70 y.o. 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 Wound Open Wounding Event: Pressure Injury Status: Date Acquired: 08/01/2015 Comorbid Cataracts, Asthma, Hypertension, Weeks Of Treatment: 19 History: Type II Diabetes, Neuropathy, Clustered Wound: No Received Chemotherapy Photos Photo Uploaded By: Alric Quan on 12/19/2015 16:40:57 Wound Measurements Length: (cm) 0.9 Width: (cm) 0.5 Depth: (cm) 0.5 Area: (cm) 0.353 Volume: (cm) 0.177 % Reduction in Area: 88.8% % Reduction in Volume: 43.6% Epithelialization: None Tunneling: Yes Position (o'clock): 6 Maximum Distance: (cm) 0.7 Wound Description Classification: Category/Stage III Wound Margin: Distinct, outline attached Exudate Amount: Large Exudate Type: Serous Exudate Color: amber Foul Odor After Cleansing: No Wound Bed Granulation Amount: None Present (0%) Exposed Structure Necrotic Amount: Large (67-100%) Fascia Exposed: No Pfund, Zoa [name]. ([id number]) Necrotic Quality: Adherent Slough Fat Layer Exposed: No Tendon Exposed: No Muscle Exposed: No Joint Exposed: No Bone Exposed: No Limited to Skin Breakdown Periwound Skin Texture Texture Color No Abnormalities Noted: No No Abnormalities Noted: No Callus: No Atrophie Blanche: No Crepitus: No Cyanosis: No Excoriation: No Ecchymosis: No Fluctuance: No Erythema: No Friable: No Hemosiderin Staining: No Induration: No Mottled: No Localized Edema: No Pallor: No Rash: No Rubor: No Scarring: No Temperature / Pain Moisture Temperature: No Abnormality No Abnormalities Noted: No Tenderness on Palpation:  Yes Dry / Scaly: No Maceration: No Moist: Yes Wound Preparation Ulcer Cleansing: Rinsed/Irrigated with Saline Topical Anesthetic Applied: Other: lidocaine 4%, Treatment Notes Wound #7 (Right Ischial Tuberosity) 1. Cleansed with: Clean wound with Normal Saline 2. Anesthetic Topical Lidocaine 4% cream to wound bed prior to debridement 4. Dressing Applied: Santyl Ointment 5. Secondary Dressing Applied Bordered Foam Dressing Electronic Signature(s) Signed: 12/19/2015 4:55:36 PM By: Rebecca Eaton RN, Sendra Entered By: Rebecca Eaton RN, Sendra on 12/19/2015 15:23:12 Morimoto, Hannie [name]. ([id number]) -------------------------------------------------------------------------------- Wound Assessment Details Patient Name: Hypes, Artesha [name]. Date of Service: 12/19/2015 3:00 PM Medical Record Number: [id number] Patient Account Number: [id number] Date of Birth/Sex: [birth year] (70 y.o. Female) Treating RN: Macarthur Critchley Primary Care Physician: Paulita Cradle Other  Clinician: Referring Physician: Paulita Cradle Treating Physician/Extender: Frann Rider in Treatment: 40 Wound Status Wound Number: 8 Primary Pressure Ulcer Etiology: Wound Location: Left Malleolus Wound Open Wounding Event: Gradually Appeared Status: Date Acquired: 09/12/2015 Comorbid Cataracts, Asthma, Hypertension, Weeks Of Treatment: 13 History: Type II Diabetes, Neuropathy, Clustered Wound: No Received Chemotherapy Photos Photo Uploaded By: Rebecca Eaton RN, Roslynn Amble on 12/19/2015 16:53:09 Wound Measurements Length: (cm) 0.8 Width: (cm) 0.7 Depth: (cm) 0.1 Area: (cm) 0.44 Volume: (cm) 0.044 % Reduction in Area: -836.2% % Reduction in Volume: -388.9% Epithelialization: None Tunneling: No Undermining: No Wound Description Classification: Category/Stage II Foul Odor A Diabetic Severity (Wagner): Grade 0 Wound Margin: Flat and Intact Exudate Amount: Large Exudate Type: Serous Exudate Color: amber fter  Cleansing: No Wound Bed Granulation Amount: Medium (34-66%) Exposed Structure Necrotic Amount: Medium (34-66%) Fascia Exposed: No Necrotic Quality: Adherent Slough Fat Layer Exposed: No Golson, Stormie [name]. ([id number]) Tendon Exposed: No Muscle Exposed: No Joint Exposed: No Bone Exposed: No Limited to Skin Breakdown Periwound Skin Texture Texture Color No Abnormalities Noted: No No Abnormalities Noted: No Callus: No Atrophie Blanche: No Crepitus: No Cyanosis: No Excoriation: No Ecchymosis: No Fluctuance: No Erythema: Yes Friable: No Erythema Location: Circumferential Induration: No Hemosiderin Staining: No Localized Edema: No Mottled: No Rash: No Pallor: No Scarring: No Rubor: No Moisture Temperature / Pain No Abnormalities Noted: No Temperature: No Abnormality Dry / Scaly: No Tenderness on Palpation: Yes Maceration: No Moist: Yes Wound Preparation Ulcer Cleansing: Rinsed/Irrigated with Saline Topical Anesthetic Applied: Other: Lidocaine 4%, Treatment Notes Wound #8 (Left Malleolus) 1. Cleansed with: Clean wound with Normal Saline 2. Anesthetic Topical Lidocaine 4% cream to wound bed prior to debridement 4. Dressing Applied: Santyl Ointment 5. Secondary Dressing Applied Bordered Foam Dressing Electronic Signature(s) Signed: 12/19/2015 4:55:36 PM By: Rebecca Eaton RN, Sendra Entered By: Rebecca Eaton RN, Sendra on 12/19/2015 15:19:41 Perazzo, Oliviarose [name]. ([id number]) -------------------------------------------------------------------------------- Wound Assessment Details Patient Name: Grochowski, Bristyn [name]. Date of Service: 12/19/2015 3:00 PM Medical Record Number: [id number] Patient Account Number: [id number] Date of Birth/Sex: [birth year] (70 y.o. 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 Cleansed with: Clean wound with Normal Saline 5. Secondary Dressing Applied Bordered Foam Dressing Electronic Signature(s) Signed: 12/22/2015 4:17:15 PM By: Rebecca Eaton RN, Sendra Entered By: Rebecca Eaton RN, Sendra on 12/22/2015 09:41:27 Buikema, Elliona [name]. ([id number]) -------------------------------------------------------------------------------- Vitals Details Patient Name: [name], Ashlea [name]. Date of Service: 12/19/2015 3:00 PM Medical Record Number: [id number] Patient Account Number: [id number] Date of Birth/Sex: 12-10-1945 (70 y.o. Female) Treating RN: Macarthur Critchley Primary Care Physician: Paulita Cradle Other Clinician: Referring Physician: Paulita Cradle Treating Physician/Extender: Frann Rider in Treatment: 40 Vital Signs Time Taken: 15:15 Temperature (F): 97.5 Height (in): 65 Pulse (bpm): 104 Weight (lbs): 100 Blood Pressure (mmHg): 112/63 Body Mass Index (BMI): 16.6 Reference Range: 80 - 120 mg / dl Electronic Signature(s) Signed: 12/19/2015 4:55:36 PM By: Rebecca Eaton, RN, Sendra Entered By: Rebecca Eaton,  RN, Roslynn Amble on 12/19/2015 16:08:16

## 2015-12-20 NOTE — Progress Notes (Addendum)
AUTIO, Lenix H. ([id number]) Visit Report for 12/19/2015 Chief Complaint Document Details Patient Name: [name], [name] Date of Service: 12/19/2015 3:00 PM Medical Record Number: [id number] Patient Account Number: [id number] Date of Birth/Sex: 1945/11/14 (70 y.o. Female) Treating RN: Macarthur Critchley Primary Care Physician: Paulita Cradle Other Clinician: Referring Physician: Paulita Cradle Treating Physician/Extender: Frann Rider in Treatment: 19 Information Obtained from: Patient Chief Complaint Patient presents to the wound care center for a consult due non healing wound. Ulcers on the right elbow and the right heel for about 1 month. Electronic Signature(s) Signed: 12/19/2015 4:25:31 PM By: Christin Fudge MD, FACS Entered By: Christin Fudge on 12/19/2015 16:25:31 [name], [name] ([id number]) -------------------------------------------------------------------------------- HPI Details Patient Name: [name], [name] Date of Service: 12/19/2015 3:00 PM Medical Record Number: [id number] Patient Account Number: [id number] Date of Birth/Sex: [birth year] (70 y.o. Female) Treating RN: Macarthur Critchley Primary Care Physician: Paulita Cradle Other Clinician: Referring Physician: Paulita Cradle Treating Physician/Extender: Frann Rider in Treatment: 40 History of Present Illness Location: Ulceration on the right heel and the right elbow. Quality: Patient reports experiencing a dull pain to affected area(s). Severity: Patient states wound (s) are getting better. Duration: Patient has had the wound for < 4 weeks prior to presenting for treatment Timing: Pain in wound is Intermittent (comes and goes Context: The wound appeared gradually over time Modifying Factors: Consults to this date include:Augmentin and Bactrim and also some heel protection with duoderm Associated Signs and Symptoms: Patient reports having difficulty standing for long periods. HPI Description:  70 year old female with history of peripheral neuropathy, history of diet controlled diabetes mellitus type 2, history of alcoholism here for wound consult sent by her PCP Dr. Sherilyn Cooter. She has pressure ulcers at her right elbow, bilateral heels. Plain films of right calcaneus without acute bony process. Patient started by PCP on Augmentin, Bactrim as per orders, DuoDerm dressings applied - reports some improvement in her ulcer since last seen. Denies fever, chills, nausea, vomiting, diarrhea. She had a right humerus fracture in the middle of May and has had no surgery and arm is in a sling. She is also been laying in the bed for quite a while. Past medical history significant for essential hypertension, osteoporosis, peripheral neuropathy, alcoholism, ataxia, personal history of breast cancer treated with surgery chemotherapy and radiation and this was done in December 2010. she is also status post laparoscopic cholecystectomy, pilonidal cyst excision, subcutaneous port placement, partial mastectomy on the left side, skin cancer removal. 03/21/2015 -- she says overall she's been doing better and continues to smoke about 15 cigarettes a day. 03/21/2015 - her orthopedic doctor has said she may require surgery for her right humerus fracture. 04/04/2015 -- her orthopedic surgery has been scheduled for August 11. 04/18/2015 -- she is doing fine as far as her elbow and her right heel goes but she has developed some redness over prominence on her thoracic spine and wanted me to take a look at this. 05/02/2015 -- she had her surgery done and now is in a sling and support. Her back has developed a pressure injury of undetermined stage. She seems to be in better spirits. 05/16/2015 -- last week her right heel was looking great and we had healed it out, but she has not been offloading appropriately and has a deep tissue injury on the right heel again. The area on her right elbow has opened out with slough and  the area in the thoracic spine is also getting worse. 05/30/2015 -- she has developed  2 new ulcerations one on her left ischial tuberosity and one on the sacral region. She is still working on getting up smoking but is also unable to take her vitamins as she says she develops a diarrhea when she takes vitamins. She has increased her intake of proteins. 06/06/2015 -- the patient's husband manages to get her a low air loss mattress with initiating pressure but [name], [name] ([id number]) did not know how to use it exactly and the patient was not happy about using it. Overall she says she's been feeling better. 07/11/2015 -- . Discussed a surgical opinion for debridement and application of a wound VAC, 2 weeks ago but the patient has been reluctant to get a surgical opinion as she wants to avoid surgery. 07/18/2015 -- they have an appointment to see Dr. Tamala Julian this coming Wednesday. 07/29/2015 -- they saw Dr. Tamala Julian in his office and he did a debridement of the wound and removed significant amount of slough. This is in addition to the debridement I had done previously on Friday where a large amount of the eschar was removed. He did not recommend the application of wound VAC. Addendum : Dr. Thompson Caul note was reviewed via EPIC and details noted as above. 08/05/2015 -- over the last week she has developed a another pressure injury to her right hip and has had significant discoloration of the skin and an eschar there. 08/15/2015 -- she is still smoking about a pack of cigarettes a day and does not seem to want to quit. They have not been able to talk to the vendor regarding the air mattress and I will ask them to get in touch again. She is reluctant to take vitamins and does admit her nutrition is poor. 08/22/2015 -- she has been unable to tolerate her vitamins and continues to smoke. They're working on getting a low air loss mattress and have been speaking with the vendor. 09/19/2015 -- since her last  visit and was admitted to the hospital between 09/09/2015 and 09/16/2015. She was thought to have an active sepsis possibly from one of her decubitus ulcers but nothing was grown except for an MSSA from her thoracic spine region. CT of this area did not show any osteomyelitis. Been given IV antibiotics which included vancomycin and Zosyn in the hospital under the care of Dr. [name] the ID specialist she was sent home on oral Keflex 500 mg 3 times a day for 2 weeks. he will consider an MRI in the outpatient setting to completely rule out osteomyelitis of the spine. 10/03/2015 --readmitted to hospital on 09/23/2015 for general debility, lethargy and possible sepsis and workup was in progress. Seen by Dr. [name] and he would also like a workup for underlying malignancy as sheos had previous ultrasounds of the breasts suggesting findings of concern. Workup done so far does not suggest deep bony infection. She was treated for a pneumonia and received Zosyn and vancomycin. She had been recommended Cipro 500 twice a day and doxycycline 100 mg twice a day once she was to be [place] home. The antibiotics were to be stopped on 10/07/2015. 10/17/2015 -- patient is feeling much better and has been eating better and doing her offloading as much as possible. 10/23/2015 -- she has an appointment to see Dr. [name] tomorrow but other than that has been doing as much as possible with offloading and increasing her diet. 11/07/2015 -- she has not been here to see [place] for 2 weeks and at the present time continues to  try and eat better, work on off loading and is using the air mattress at night. She saw her PCP yesterday and she has put her back on a fentanyl patch. 11/21/2015 -- for some strange reason she has been sitting in a chair with her legs dependent for the last 6 days nonstop and has developed massive lower extremity edema and pedal edema with weeping and ulceration. Electronic  Signature(s) Signed: 12/19/2015 4:25:37 PM By: Christin Fudge MD, FACS Entered By: Christin Fudge on 12/19/2015 16:25:37 [name], [name] ([id number]) -------------------------------------------------------------------------------- Physical Exam Details Patient Name: Winburn, Tyerra H. Date of Service: 12/19/2015 3:00 PM Medical Record Number: [id number] Patient Account Number: [id number] Date of Birth/Sex: 1946-04-03 (70 y.o. Female) Treating RN: Macarthur Critchley Primary Care Physician: Paulita Cradle Other Clinician: Referring Physician: Paulita Cradle Treating Physician/Extender: Frann Rider in Treatment: 40 Constitutional . Pulse regular. Respirations normal and unlabored. Afebrile. . Eyes Nonicteric. Reactive to light. Ears, Nose, Mouth, and Throat Lips, teeth, and gums WNL.Marland Kitchen Moist mucosa without lesions. Neck supple and nontender. No palpable supraclavicular or cervical adenopathy. Normal sized without goiter. Respiratory WNL. No retractions.. Cardiovascular Pedal Pulses WNL. No clubbing, cyanosis or edema. Lymphatic No adneopathy. No adenopathy. No adenopathy. Musculoskeletal Adexa without tenderness or enlargement.. Digits and nails w/o clubbing, cyanosis, infection, petechiae, ischemia, or inflammatory conditions.. Integumentary (Hair, Skin) No suspicious lesions. No crepitus or fluctuance. No peri-wound warmth or erythema. No masses.Marland Kitchen Psychiatric Judgement and insight Intact.. No evidence of depression, anxiety, or agitation.. Notes overall the previous wounds are looking much better and there are couple of areas like the medial part of the right heel and the thoracic spine region where there is some hyper granulation tissue and I have cauterized this with silver nitrate. A few of the wounds requires central ointment and the rest will be treated with silver alginate. Electronic Signature(s) Signed: 12/19/2015 4:26:32 PM By: Christin Fudge MD, FACS Entered By:  Christin Fudge on 12/19/2015 16:26:32 Delucchi, [name] Kitchen ([id number]) -------------------------------------------------------------------------------- Physician Orders Details Patient Name: [name], [name] Date of Service: 12/19/2015 3:00 PM Medical Record Number: [id number] Patient Account Number: [id number] Date of Birth/Sex: Feb 04, 1946 (70 y.o. Female) Treating RN: Macarthur Critchley Primary Care Physician: Paulita Cradle Other Clinician: Referring Physician: Paulita Cradle Treating Physician/Extender: Frann Rider in Treatment: 58 Verbal / Phone Orders: Yes Clinician: Macarthur Critchley Read Back and Verified: Yes Diagnosis Coding Wound Cleansing Wound #10 Right,Medial Foot o Clean wound with Normal Saline. o Cleanse wound with mild soap and water Wound #11 Right,Distal Calcaneus o Clean wound with Normal Saline. o Cleanse wound with mild soap and water Wound #3 Midline Back o Clean wound with Normal Saline. o Cleanse wound with mild soap and water Wound #7 Right Ischial Tuberosity o Clean wound with Normal Saline. o Cleanse wound with mild soap and water Wound #8 Left Malleolus o Clean wound with Normal Saline. o Cleanse wound with mild soap and water Wound #9 Left Ischial Tuberosity o Clean wound with Normal Saline. o Cleanse wound with mild soap and water Anesthetic Wound #10 Right,Medial Foot o Topical Lidocaine 4% cream applied to wound bed prior to debridement Wound #11 Right,Distal Calcaneus o Topical Lidocaine 4% cream applied to wound bed prior to debridement Wound #3 Midline Back o Topical Lidocaine 4% cream applied to wound bed prior to debridement Wound #7 Right Ischial Tuberosity o Topical Lidocaine 4% cream applied to wound bed prior to debridement Knittel, Ziyonna H. ([id number]) Wound #8 Left Malleolus o Topical Lidocaine 4% cream applied to wound bed  prior to debridement Wound #9 Left Ischial Tuberosity o Topical  Lidocaine 4% cream applied to wound bed prior to debridement Skin Barriers/Peri-Wound Care Wound #10 Right,Medial Foot o Skin Prep Wound #11 Right,Distal Calcaneus o Skin Prep Wound #3 Midline Back o Skin Prep Wound #7 Right Ischial Tuberosity o Skin Prep Wound #8 Left Malleolus o Skin Prep Primary Wound Dressing Wound #7 Right Ischial Tuberosity o Santyl Ointment Wound #8 Left Malleolus o Santyl Ointment Wound #10 Right,Medial Foot o Aquacel Ag Wound #11 Right,Distal Calcaneus o Aquacel Ag Wound #3 Midline Back o Other: - Sorbact with hydrogel Wound #9 Left Ischial Tuberosity o Other: - BFD Secondary Dressing Wound #10 Right,Medial Foot o Boardered Foam Dressing Wound #11 Right,Distal Calcaneus o Boardered Foam Dressing [name], [name] ([id number]) Wound #3 Midline Back o Boardered Foam Dressing Wound #7 Right Ischial Tuberosity o Boardered Foam Dressing Wound #8 Left Malleolus o Boardered Foam Dressing Wound #9 Left Ischial Tuberosity o Boardered Foam Dressing Dressing Change Frequency Wound #10 Right,Medial Foot o Change dressing every other day. Wound #11 Right,Distal Calcaneus o Change dressing every other day. Wound #3 Midline Back o Change dressing every other day. Wound #7 Right Ischial Tuberosity o Change dressing every day. Wound #8 Left Malleolus o Change dressing every day. Follow-up Appointments Wound #10 Right,Medial Foot o Return Appointment in 2 weeks. Wound #11 Right,Distal Calcaneus o Return Appointment in 2 weeks. Wound #3 Midline Back o Return Appointment in 2 weeks. Wound #7 Right Ischial Tuberosity o Return Appointment in 2 weeks. Wound #8 Left Malleolus o Return Appointment in 2 weeks. Wound #9 Left Ischial Tuberosity o Return Appointment in 2 weeks. [name], [name] ([id number]) Edema Control Wound #10 Right,Medial Foot o Elevate legs to the level of the heart and pump  ankles as often as possible Wound #11 Right,Distal Calcaneus o Elevate legs to the level of the heart and pump ankles as often as possible Wound #8 Left Malleolus o Elevate legs to the level of the heart and pump ankles as often as possible Off-Loading o Turn and reposition every 2 hours o Other: - float heels while in the bed Wound #3 Midline Back o Turn and reposition every 2 hours o Other: - float heels while in the bed Wound #7 Right Ischial Tuberosity o Turn and reposition every 2 hours o Other: - float heels while in the bed Wound #8 Left Malleolus o Turn and reposition every 2 hours o Other: - float heels while in the bed Wound #9 Left Ischial Tuberosity o Turn and reposition every 2 hours o Other: - float heels while in the bed Home Health Wound #10 [place] Visits - [place] Nurse may visit PRN to address patientos wound care needs. o FACE TO FACE ENCOUNTER: MEDICARE and MEDICAID PATIENTS: I certify that this patient is under my care and that I had a face-to-face encounter that meets the physician face-to-face encounter requirements with this patient on this date. The encounter with the patient was in whole or in part for the following MEDICAL CONDITION: (primary reason for [place]) MEDICAL NECESSITY: I certify, that based on my findings, NURSING services are a medically necessary home health service. HOME BOUND STATUS: I certify that my clinical findings support that this patient is homebound (i.e., Due to illness or injury, pt requires aid of supportive devices such as crutches, cane, wheelchairs, walkers, the use of special transportation or the assistance of another person to leave their place  of residence. There is a normal inability to leave the home and doing so requires considerable and taxing effort. Other absences are for medical reasons / religious services and are infrequent or of  short duration when for other reasons). o If current dressing causes regression in wound condition, may D/C ordered dressing product/s and apply Normal Saline Moist Dressing daily until next [place] / Other MD appointment. [place] of regression in wound condition at [phone]. [name], [name] ([id number]) o Please direct any NON-WOUND related issues/requests for orders to patient's Primary Care Physician Wound #11 [place] Visits - [place] Nurse may visit PRN to address patientos wound care needs. o FACE TO FACE ENCOUNTER: MEDICARE and MEDICAID PATIENTS: I certify that this patient is under my care and that I had a face-to-face encounter that meets the physician face-to-face encounter requirements with this patient on this date. The encounter with the patient was in whole or in part for the following MEDICAL CONDITION: (primary reason for [place]) MEDICAL NECESSITY: I certify, that based on my findings, NURSING services are a medically necessary home health service. HOME BOUND STATUS: I certify that my clinical findings support that this patient is homebound (i.e., Due to illness or injury, pt requires aid of supportive devices such as crutches, cane, wheelchairs, walkers, the use of special transportation or the assistance of another person to leave their place of residence. There is a normal inability to leave the home and doing so requires considerable and taxing effort. Other absences are for medical reasons / religious services and are infrequent or of short duration when for other reasons). o If current dressing causes regression in wound condition, may D/C ordered dressing product/s and apply Normal Saline Moist Dressing daily until next [place] / Other MD appointment. [place] of regression in wound condition at [phone]. o Please direct any  NON-WOUND related issues/requests for orders to patient's Primary Care Physician Wound #3 Midline Back o [place] Visits - [place] Nurse may visit PRN to address patientos wound care needs. o FACE TO FACE ENCOUNTER: MEDICARE and MEDICAID PATIENTS: I certify that this patient is under my care and that I had a face-to-face encounter that meets the physician face-to-face encounter requirements with this patient on this date. The encounter with the patient was in whole or in part for the following MEDICAL CONDITION: (primary reason for [place]) MEDICAL NECESSITY: I certify, that based on my findings, NURSING services are a medically necessary home health service. HOME BOUND STATUS: I certify that my clinical findings support that this patient is homebound (i.e., Due to illness or injury, pt requires aid of supportive devices such as crutches, cane, wheelchairs, walkers, the use of special transportation or the assistance of another person to leave their place of residence. There is a normal inability to leave the home and doing so requires considerable and taxing effort. Other absences are for medical reasons / religious services and are infrequent or of short duration when for other reasons). o If current dressing causes regression in wound condition, may D/C ordered dressing product/s and apply Normal Saline Moist Dressing daily until next [place] / Other MD appointment. [place] of regression in wound condition at [phone]. o Please direct any NON-WOUND related issues/requests for orders to patient's Primary Care Physician Wound #7 Right Ischial [place] Visits - [place]  [facility] Nurse may visit PRN to address patientos wound care needs. o FACE TO FACE ENCOUNTER: MEDICARE and MEDICAID PATIENTS: I certify that this patient is under my care and that I had a face-to-face encounter  that meets the physician face-to-face [name], Waldo. ([id number]) encounter requirements with this patient on this date. The encounter with the patient was in whole or in part for the following MEDICAL CONDITION: (primary reason for [place]) MEDICAL NECESSITY: I certify, that based on my findings, NURSING services are a medically necessary home health service. HOME BOUND STATUS: I certify that my clinical findings support that this patient is homebound (i.e., Due to illness or injury, pt requires aid of supportive devices such as crutches, cane, wheelchairs, walkers, the use of special transportation or the assistance of another person to leave their place of residence. There is a normal inability to leave the home and doing so requires considerable and taxing effort. Other absences are for medical reasons / religious services and are infrequent or of short duration when for other reasons). o If current dressing causes regression in wound condition, may D/C ordered dressing product/s and apply Normal Saline Moist Dressing daily until next [place] / Other MD appointment. [place] of regression in wound condition at [phone]. o Please direct any NON-WOUND related issues/requests for orders to patient's Primary Care Physician Wound #8 Left [place] Visits - [place] Nurse may visit PRN to address patientos wound care needs. o FACE TO FACE ENCOUNTER: MEDICARE and MEDICAID PATIENTS: I certify that this patient is under my care and that I had a face-to-face encounter that meets the physician face-to-face encounter requirements with this patient on this date. The encounter with the patient was in whole or in part for the following MEDICAL CONDITION: (primary reason for [place]) MEDICAL NECESSITY: I certify, that based on my findings, NURSING services are a medically necessary home health service. HOME  BOUND STATUS: I certify that my clinical findings support that this patient is homebound (i.e., Due to illness or injury, pt requires aid of supportive devices such as crutches, cane, wheelchairs, walkers, the use of special transportation or the assistance of another person to leave their place of residence. There is a normal inability to leave the home and doing so requires considerable and taxing effort. Other absences are for medical reasons / religious services and are infrequent or of short duration when for other reasons). o If current dressing causes regression in wound condition, may D/C ordered dressing product/s and apply Normal Saline Moist Dressing daily until next [place] / Other MD appointment. [place] of regression in wound condition at [phone]. o Please direct any NON-WOUND related issues/requests for orders to patient's Primary Care Physician Wound #9 Left Ischial [place] Visits - [place] Nurse may visit PRN to address patientos wound care needs. o FACE TO FACE ENCOUNTER: MEDICARE and MEDICAID PATIENTS: I certify that this patient is under my care and that I had a face-to-face encounter that meets the physician face-to-face encounter requirements with this patient on this date. The encounter with the patient was in whole or in part for the following MEDICAL CONDITION: (primary reason for [place]) MEDICAL NECESSITY: I certify, that based on my findings, NURSING services are a medically necessary home health service. HOME BOUND STATUS: I certify that my clinical findings support that this patient is homebound (i.e., Due to illness  or injury, pt requires aid of supportive devices such as crutches, cane, wheelchairs, walkers, the use of special transportation or the assistance of another person to leave their place of residence. There is a normal inability to leave the home and doing  so requires considerable and taxing effort. Other [place], Kierstin H. ([id number]) absences are for medical reasons / religious services and are infrequent or of short duration when for other reasons). o If current dressing causes regression in wound condition, may D/C ordered dressing product/s and apply Normal Saline Moist Dressing daily until next [place] / Other MD appointment. [place] of regression in wound condition at [phone]. o Please direct any NON-WOUND related issues/requests for orders to patient's Primary Care Physician Electronic Signature(s) Signed: 12/19/2015 3:50:20 PM By: Ardean Larsen Signed: 12/19/2015 4:30:51 PM By: Christin Fudge MD, FACS Entered By: Rebecca Eaton RN, [place] on 12/19/2015 15:50:19 Salehi, Michaella H. ([id number]) -------------------------------------------------------------------------------- Problem List Details Patient Name: Delprado, Lakrista H. Date of Service: 12/19/2015 3:00 PM Medical Record Number: [id number] Patient Account Number: [id number] Date of Birth/Sex: 04-24-1946 (70 y.o. 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 2 diabetes mellitus with foot ulcer 03/13/2015 Yes L89.613 Pressure ulcer of right heel, stage 3 03/13/2015 Yes F17.218 Nicotine dependence, cigarettes, with other nicotine- 03/13/2015 Yes induced disorders L89.100 Pressure ulcer of unspecified part of back, unstageable 05/02/2015 Yes L89.213 Pressure ulcer of right hip, stage 3 08/05/2015 Yes I89.0 Lymphedema, not elsewhere classified 11/21/2015 Yes L97.211 Non-pressure chronic ulcer of right calf limited to 11/21/2015 Yes breakdown of skin Inactive Problems Resolved Problems ICD-10 Code Description  Active Date Resolved Date L89.013 Pressure ulcer of right elbow, stage 3 03/13/2015 03/13/2015 [id number] Pressure ulcer of left hip, stage 2 05/30/2015 05/30/2015 Largent, Sanaz H. ([id number]) L89.153 Pressure ulcer of sacral region, stage 3 05/30/2015 05/30/2015 Electronic Signature(s) Signed: 12/19/2015 4:25:25 PM By: Christin Fudge MD, FACS Entered By: Christin Fudge on 12/19/2015 16:25:25 Slyter, Preslee H. ([id number]) -------------------------------------------------------------------------------- Progress Note Details Patient Name: Raucci, Walida H. Date of Service: 12/19/2015 3:00 PM Medical Record Number: [id number] Patient Account Number: [id number] Date of Birth/Sex: Mar 14, 1946 (70 y.o. Female) Treating RN: Macarthur Critchley Primary Care Physician: Paulita Cradle Other Clinician: Referring Physician: Paulita Cradle Treating Physician/Extender: Frann Rider in Treatment: 3 Subjective Chief Complaint Information obtained from Patient Patient presents to the wound care center for a consult due non healing wound. Ulcers on the right elbow and the right heel for about 1 month. History of Present Illness (HPI) The following HPI elements were documented for the patient's wound: Location: Ulceration on the right heel and the right elbow. Quality: Patient reports experiencing a dull pain to affected area(s). Severity: Patient states wound (s) are getting better. Duration: Patient has had the wound for < 4 weeks prior to presenting for treatment Timing: Pain in wound is Intermittent (comes and goes Context: The wound appeared gradually over time Modifying Factors: Consults to this date include:Augmentin and Bactrim and also some heel protection with duoderm Associated Signs and Symptoms: Patient reports having difficulty standing for long periods. 70 year old female with history of peripheral neuropathy, history of diet controlled diabetes mellitus type 2, history of alcoholism here for  wound consult sent by her PCP Dr. Sherilyn Cooter. She has pressure ulcers at her right elbow, bilateral heels. Plain films of right calcaneus without acute bony process. Patient  started by PCP on Augmentin, Bactrim as per orders, DuoDerm dressings applied - reports some improvement in her ulcer since last seen. Denies fever, chills, nausea, vomiting, diarrhea. She had a right humerus fracture in the middle of May and has had no surgery and arm is in a sling. She is also been laying in the bed for quite a while. Past medical history significant for essential hypertension, osteoporosis, peripheral neuropathy, alcoholism, ataxia, personal history of breast cancer treated with surgery chemotherapy and radiation and this was done in December 2010. she is also status post laparoscopic cholecystectomy, pilonidal cyst excision, subcutaneous port placement, partial mastectomy on the left side, skin cancer removal. 03/21/2015 -- she says overall she's been doing better and continues to smoke about 15 cigarettes a day. 03/21/2015 - her orthopedic doctor has said she may require surgery for her right humerus fracture. 04/04/2015 -- her orthopedic surgery has been scheduled for August 11. 04/18/2015 -- she is doing fine as far as her elbow and her right heel goes but she has developed some redness over prominence on her thoracic spine and wanted me to take a look at this. Lippmann, Eren H. ([id number]) 05/02/2015 -- she had her surgery done and now is in a sling and support. Her back has developed a pressure injury of undetermined stage. She seems to be in better spirits. 05/16/2015 -- last week her right heel was looking great and we had healed it out, but she has not been offloading appropriately and has a deep tissue injury on the right heel again. The area on her right elbow has opened out with slough and the area in the thoracic spine is also getting worse. 05/30/2015 -- she has developed 2 new ulcerations one on  her left ischial tuberosity and one on the sacral region. She is still working on getting up smoking but is also unable to take her vitamins as she says she develops a diarrhea when she takes vitamins. She has increased her intake of proteins. 06/06/2015 -- the patient's husband manages to get her a low air loss mattress with initiating pressure but did not know how to use it exactly and the patient was not happy about using it. Overall she says she's been feeling better. 07/11/2015 -- . Discussed a surgical opinion for debridement and application of a wound VAC, 2 weeks ago but the patient has been reluctant to get a surgical opinion as she wants to avoid surgery. 07/18/2015 -- they have an appointment to see Dr. Tamala Julian this coming Wednesday. 07/29/2015 -- they saw Dr. Tamala Julian in his office and he did a debridement of the wound and removed significant amount of slough. This is in addition to the debridement I had done previously on Friday where a large amount of the eschar was removed. He did not recommend the application of wound VAC. Addendum : Dr. Thompson Caul note was reviewed via EPIC and details noted as above. 08/05/2015 -- over the last week she has developed a another pressure injury to her right hip and has had significant discoloration of the skin and an eschar there. 08/15/2015 -- she is still smoking about a pack of cigarettes a day and does not seem to want to quit. They have not been able to talk to the vendor regarding the air mattress and I will ask them to get in touch again. She is reluctant to take vitamins and does admit her nutrition is poor. 08/22/2015 -- she has been unable to tolerate her vitamins and continues  to smoke. They're working on getting a low air loss mattress and have been speaking with the vendor. 09/19/2015 -- since her last visit and was admitted to the hospital between 09/09/2015 and 09/16/2015. She was thought to have an active sepsis possibly from one of her  decubitus ulcers but nothing was grown except for an MSSA from her thoracic spine region. CT of this area did not show any osteomyelitis. Been given IV antibiotics which included vancomycin and Zosyn in the hospital under the care of Dr. [name] the ID specialist she was sent home on oral Keflex 500 mg 3 times a day for 2 weeks. he will consider an MRI in the outpatient setting to completely rule out osteomyelitis of the spine. 10/03/2015 --readmitted to hospital on 09/23/2015 for general debility, lethargy and possible sepsis and workup was in progress. Seen by Dr. [name] and he would also like a workup for underlying malignancy as she s had previous ultrasounds of the breasts suggesting findings of concern. Workup done so far does not suggest deep bony infection. She was treated for a pneumonia and received Zosyn and vancomycin. She had been recommended Cipro 500 twice a day and doxycycline 100 mg twice a day once she was to be [place] home. The antibiotics were to be stopped on 10/07/2015. 10/17/2015 -- patient is feeling much better and has been eating better and doing her offloading as much as possible. 10/23/2015 -- she has an appointment to see Dr. [name] tomorrow but other than that has been doing as much as possible with offloading and increasing her diet. 11/07/2015 -- she has not been here to see [place] for 2 weeks and at the present time continues to try and eat better, work on off loading and is using the air mattress at night. She saw her PCP yesterday and she has put her back on a fentanyl patch. 11/21/2015 -- for some strange reason she has been sitting in a chair with her legs dependent for the last 6 days nonstop and has developed massive lower extremity edema and pedal edema with weeping and ulceration. [name], [name] ([id number]) Objective Constitutional Pulse regular. Respirations normal and unlabored. Afebrile. Vitals Time Taken: 3:15 PM, Height: 65 in, Weight: 100  lbs, BMI: 16.6, Temperature: 97.5 F, Pulse: 104 bpm, Blood Pressure: 112/63 mmHg. Eyes Nonicteric. Reactive to light. Ears, Nose, Mouth, and Throat Lips, teeth, and gums WNL.Marland Kitchen Moist mucosa without lesions. Neck supple and nontender. No palpable supraclavicular or cervical adenopathy. Normal sized without goiter. Respiratory WNL. No retractions.. Cardiovascular Pedal Pulses WNL. No clubbing, cyanosis or edema. Lymphatic No adneopathy. No adenopathy. No adenopathy. Musculoskeletal Adexa without tenderness or enlargement.. Digits and nails w/o clubbing, cyanosis, infection, petechiae, ischemia, or inflammatory conditions.Marland Kitchen Psychiatric Judgement and insight Intact.. No evidence of depression, anxiety, or agitation.. General Notes: overall the previous wounds are looking much better and there are couple of areas like the medial part of the right heel and the thoracic spine region where there is some hyper granulation tissue and I have cauterized this with silver nitrate. A few of the wounds requires central ointment and the rest will be treated with silver alginate. Integumentary (Hair, Skin) No suspicious lesions. No crepitus or fluctuance. No peri-wound warmth or erythema. No masses.. Wound #10 status is Open. Original cause of wound was Gradually Appeared. The wound is located on the [place], [place] ([id number]) Right,Medial Foot. The wound measures 0.5cm length x 0.5cm width x 0.2cm depth; 0.196cm^2 area and 0.039cm^3 volume. The wound is  limited to skin breakdown. There is no tunneling or undermining noted. There is a medium amount of serous drainage noted. The wound margin is flat and intact. There is medium (34-66%) pink granulation within the wound bed. There is a medium (34-66%) amount of necrotic tissue within the wound bed including Adherent Slough. The periwound skin appearance exhibited: Moist, Erythema. The periwound skin appearance did not exhibit: Callus, Crepitus,  Excoriation, Fluctuance, Friable, Induration, Localized Edema, Rash, Scarring, Dry/Scaly. The surrounding wound skin color is noted with erythema which is circumferential. Periwound temperature was noted as No Abnormality. The periwound has tenderness on palpation. Wound #11 status is Open. Original cause of wound was Pressure Injury. The wound is located on the Right,Distal Calcaneus. The wound measures 1.6cm length x 1cm width x 0.1cm depth; 1.257cm^2 area and 0.126cm^3 volume. The wound is limited to skin breakdown. There is no tunneling or undermining noted. There is a large amount of serous drainage noted. The wound margin is flat and intact. There is small (1-33%) pink granulation within the wound bed. There is a large (67-100%) amount of necrotic tissue within the wound bed including Adherent Slough. The periwound skin appearance exhibited: Maceration, Moist. The periwound skin appearance did not exhibit: Callus, Crepitus, Excoriation, Fluctuance, Friable, Induration, Localized Edema, Rash, Scarring, Dry/Scaly, Atrophie Blanche, Cyanosis, Ecchymosis, Hemosiderin Staining, Mottled, Pallor, Rubor, Erythema. Periwound temperature was noted as No Abnormality. The periwound has tenderness on palpation. Wound #13 status is Healed - Epithelialized. Original cause of wound was Pressure Injury. The wound is located on the Left Calcaneus. The wound measures 0cm length x 0cm width x 0cm depth; 0cm^2 area and 0cm^3 volume. The wound is limited to skin breakdown. There is a large amount of serous drainage noted. The wound margin is flat and intact. There is no granulation within the wound bed. There is no necrotic tissue within the wound bed. The periwound skin appearance exhibited: Moist. The periwound skin appearance did not exhibit: Maceration. Periwound temperature was noted as No Abnormality. Wound #3 status is Open. Original cause of wound was Pressure Injury. The wound is located on the Midline  Back. The wound measures 2cm length x 1.9cm width x 0.8cm depth; 2.985cm^2 area and 2.388cm^3 volume. The wound is limited to skin breakdown. There is no tunneling or undermining noted. There is a large amount of serosanguineous drainage noted. The wound margin is distinct with the outline attached to the wound base. There is medium (34-66%) red, pink granulation within the wound bed. There is a medium (34-66%) amount of necrotic tissue within the wound bed including Adherent Slough. The periwound skin appearance exhibited: Moist. The periwound skin appearance did not exhibit: Callus, Crepitus, Excoriation, Fluctuance, Friable, Induration, Localized Edema, Rash, Scarring, Dry/Scaly, Maceration, Atrophie Blanche, Cyanosis, Ecchymosis, Hemosiderin Staining, Mottled, Pallor, Rubor, Erythema. Periwound temperature was noted as No Abnormality. Wound #7 status is Open. Original cause of wound was Pressure Injury. The wound is located on the Right Ischial Tuberosity. The wound measures 0.9cm length x 0.5cm width x 0.5cm depth; 0.353cm^2 area and 0.177cm^3 volume. The wound is limited to skin breakdown. There is tunneling at 6:00 with a maximum distance of 0.7cm. There is a large amount of serous drainage noted. The wound margin is distinct with the outline attached to the wound base. There is no granulation within the wound bed. There is a large (67- 100%) amount of necrotic tissue within the wound bed including Adherent Slough. The periwound skin appearance exhibited: Moist. The periwound skin appearance did not exhibit:  Callus, Crepitus, Excoriation, Fluctuance, Friable, Induration, Localized Edema, Rash, Scarring, Dry/Scaly, Maceration, Atrophie Blanche, Cyanosis, Ecchymosis, Hemosiderin Staining, Mottled, Pallor, Rubor, Erythema. Periwound temperature was noted as No Abnormality. The periwound has tenderness on palpation. Wound #8 status is Open. Original cause of wound was Gradually Appeared. The  wound is located on the [place]. ([id number]) Left Malleolus. The wound measures 0.8cm length x 0.7cm width x 0.1cm depth; 0.44cm^2 area and 0.044cm^3 volume. The wound is limited to skin breakdown. There is no tunneling or undermining noted. There is a large amount of serous drainage noted. The wound margin is flat and intact. There is medium (34-66%) granulation within the wound bed. There is a medium (34-66%) amount of necrotic tissue within the wound bed including Adherent Slough. The periwound skin appearance exhibited: Moist, Erythema. The periwound skin appearance did not exhibit: Callus, Crepitus, Excoriation, Fluctuance, Friable, Induration, Localized Edema, Rash, Scarring, Dry/Scaly, Maceration, Atrophie Blanche, Cyanosis, Ecchymosis, Hemosiderin Staining, Mottled, Pallor, Rubor. The surrounding wound skin color is noted with erythema which is circumferential. Periwound temperature was noted as No Abnormality. The periwound has tenderness on palpation. Wound #9 status is Healed - Epithelialized. Original cause of wound was Gradually Appeared. The wound is located on the Left Ischial Tuberosity. The wound measures 0cm length x 0cm width x 0cm depth; 0cm^2 area and 0cm^3 volume. 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 Cleanse wound with mild soap and water Wound #11 Right,Distal Calcaneus: Clean wound with Normal Saline. Cleanse wound with mild soap and water Wound #3 Midline  Back: Clean wound with Normal Saline. Cleanse wound with mild soap and water Wound #7 Right Ischial Tuberosity: Clean wound with Normal Saline. [name], [name] ([id number]) Cleanse wound with mild soap and water Wound #8 Left Malleolus: Clean wound with Normal Saline. Cleanse wound with mild soap and water Wound #9 Left Ischial Tuberosity: Clean wound with Normal Saline. Cleanse wound with mild soap and water Anesthetic: Wound #10 Right,Medial Foot: Topical Lidocaine 4% cream applied to wound bed prior to debridement Wound #11 Right,Distal Calcaneus: Topical Lidocaine 4% cream applied to wound bed prior to debridement Wound #3 Midline Back: Topical Lidocaine 4% cream applied to wound bed prior to debridement Wound #7 Right Ischial Tuberosity: Topical Lidocaine 4% cream applied to wound bed prior to debridement Wound #8 Left Malleolus: Topical Lidocaine 4% cream applied to wound bed prior to debridement Wound #9 Left Ischial Tuberosity: Topical Lidocaine 4% cream applied to wound bed prior to debridement Skin Barriers/Peri-Wound Care: Wound #10 Right,Medial Foot: Skin Prep Wound #11 Right,Distal Calcaneus: Skin Prep Wound #3 Midline Back: Skin Prep Wound #7 Right Ischial Tuberosity: Skin Prep Wound #8 Left Malleolus: Skin Prep Primary Wound Dressing: Wound #7 Right Ischial Tuberosity: Santyl Ointment Wound #8 Left Malleolus: Santyl Ointment Wound #10 Right,Medial Foot: Aquacel Ag Wound #11 Right,Distal Calcaneus: Aquacel Ag Wound #3 Midline Back: Other: - Sorbact with hydrogel Wound #9 Left Ischial Tuberosity: Other: - BFD Secondary Dressing: Wound #10 Right,Medial Foot: Boardered Foam Dressing Wound #11 Right,Distal Calcaneus: Boardered Foam Dressing Wound #3 Midline Back: Boardered Foam Dressing  Boozer, [name] ([id number]) Wound #7 Right Ischial Tuberosity: Boardered Foam Dressing Wound #8 Left Malleolus: Boardered Foam Dressing Wound #9 Left Ischial  Tuberosity: Boardered Foam Dressing Dressing Change Frequency: Wound #10 Right,Medial Foot: Change dressing every other day. Wound #11 Right,Distal Calcaneus: Change dressing every other day. Wound #3 Midline Back: Change dressing every other day. Wound #7 Right Ischial Tuberosity: Change dressing every day. Wound #8 Left Malleolus: Change dressing every day. Follow-up Appointments: Wound #10 Right,Medial Foot: Return Appointment in 2 weeks. Wound #11 Right,Distal Calcaneus: Return Appointment in 2 weeks. Wound #3 Midline Back: Return Appointment in 2 weeks. Wound #7 Right Ischial Tuberosity: Return Appointment in 2 weeks. Wound #8 Left Malleolus: Return Appointment in 2 weeks. Wound #9 Left Ischial Tuberosity: Return Appointment in 2 weeks. Edema Control: Wound #10 Right,Medial Foot: Elevate legs to the level of the heart and pump ankles as often as possible Wound #11 Right,Distal Calcaneus: Elevate legs to the level of the heart and pump ankles as often as possible Wound #8 Left Malleolus: Elevate legs to the level of the heart and pump ankles as often as possible Off-Loading: Turn and reposition every 2 hours Other: - float heels while in the bed Wound #3 Midline Back: Turn and reposition every 2 hours Other: - float heels while in the bed Wound #7 Right Ischial Tuberosity: Turn and reposition every 2 hours Other: - float heels while in the bed Wound #8 Left Malleolus: Turn and reposition every 2 hours Other: - float heels while in the bed Wound #9 Left Ischial Tuberosity: Turn and reposition every 2 hours Grossi, Delitha H. ([id number]) Other: - float heels while in the bed Home Health: Wound #10 Right,Medial Foot: [place] [facility] may visit PRN to address patient s wound care needs. FACE TO FACE ENCOUNTER: MEDICARE and MEDICAID PATIENTS: I certify that this patient is under my care and that I had a face-to-face encounter  that meets the physician face-to-face encounter requirements with this patient on this date. The encounter with the patient was in whole or in part for the following MEDICAL CONDITION: (primary reason for [place]) MEDICAL NECESSITY: I certify, that based on my findings, NURSING services are a medically necessary home health service. HOME BOUND STATUS: I certify that my clinical findings support that this patient is homebound (i.e., Due to illness or injury, pt requires aid of supportive devices such as crutches, cane, wheelchairs, walkers, the use of special transportation or the assistance of another person to leave their place of residence. There is a normal inability to leave the home and doing so requires considerable and taxing effort. Other absences are for medical reasons / religious services and are infrequent or of short duration when for other reasons). If current dressing causes regression in wound condition, may D/C ordered dressing product/s and apply Normal Saline Moist Dressing daily until next [place] / Other MD appointment. [place] of regression in wound condition at [phone]. Please direct any NON-WOUND related issues/requests for orders to patient's Primary Care Physician Wound #11 Right,Distal Calcaneus: [place] Visits - [facility] Nurse may visit PRN to address patient s wound care needs. FACE TO FACE ENCOUNTER: MEDICARE and MEDICAID PATIENTS: I certify that this patient is under my care and that I had a face-to-face encounter that meets the physician face-to-face encounter requirements with this patient on this date. The encounter with the patient was in whole or in part for the  following MEDICAL CONDITION: (primary reason for Home Healthcare) MEDICAL NECESSITY: I certify, that based on my findings, NURSING services are a medically necessary home health service. HOME BOUND STATUS: I certify that my clinical  findings support that this patient is homebound (i.e., Due to illness or injury, pt requires aid of supportive devices such as crutches, cane, wheelchairs, walkers, the use of special transportation or the assistance of another person to leave their place of residence. There is a normal inability to leave the home and doing so requires considerable and taxing effort. Other absences are for medical reasons / religious services and are infrequent or of short duration when for other reasons). If current dressing causes regression in wound condition, may D/C ordered dressing product/s and apply Normal Saline Moist Dressing daily until next [place] / Other MD appointment. [place] of regression in wound condition at [phone]. Please direct any NON-WOUND related issues/requests for orders to patient's Primary Care Physician Wound #3 Midline Back: [place] Visits - [facility] Nurse may visit PRN to address patient s wound care needs. FACE TO FACE ENCOUNTER: MEDICARE and MEDICAID PATIENTS: I certify that this patient is under my care and that I had a face-to-face encounter that meets the physician face-to-face encounter requirements with this patient on this date. The encounter with the patient was in whole or in part for the following MEDICAL CONDITION: (primary reason for [place]) MEDICAL NECESSITY: I certify, that based on my findings, NURSING services are a medically necessary home health service. HOME BOUND STATUS: I certify that my clinical findings support that this patient is homebound (i.e., Due to illness or injury, pt requires aid of supportive devices such as crutches, cane, wheelchairs, walkers, the use of special transportation or the assistance of another person to leave their place of residence. There is a normal inability to leave the home and doing so requires considerable and taxing effort. Other absences are for medical  reasons / religious services and are infrequent or of short duration when for other reasons). If current dressing causes regression in wound condition, may D/C ordered dressing product/s and apply Normal Saline Moist Dressing daily until next [place] / Other MD appointment. Notify Wound Blocher, Davida H. ([id number]) [place] of regression in wound condition at [phone]. Please direct any NON-WOUND related issues/requests for orders to patient's Primary Care Physician Wound #7 Right Ischial Tuberosity: [place] [facility] Nurse may visit PRN to address patient s wound care needs. FACE TO FACE ENCOUNTER: MEDICARE and MEDICAID PATIENTS: I certify that this patient is under my care and that I had a face-to-face encounter that meets the physician face-to-face encounter requirements with this patient on this date. The encounter with the patient was in whole or in part for the following MEDICAL CONDITION: (primary reason for [place]) MEDICAL NECESSITY: I certify, that based on my findings, NURSING services are a medically necessary home health service. HOME BOUND STATUS: I certify that my clinical findings support that this patient is homebound (i.e., Due to illness or injury, pt requires aid of supportive devices such as crutches, cane, wheelchairs, walkers, the use of special transportation or the assistance of another person to leave their place of residence. There is a normal inability to leave the home and doing so requires considerable and taxing effort. Other absences are for medical reasons / religious services and are infrequent or of short duration when for other reasons). If current  dressing causes regression in wound condition, may D/C ordered dressing product/s and apply Normal Saline Moist Dressing daily until next [place] / Other MD appointment. [place] of regression in wound condition at  [phone]. Please direct any NON-WOUND related issues/requests for orders to patient's Primary Care Physician Wound #8 Left Malleolus: [place] [facility] Nurse may visit PRN to address patient s wound care needs. FACE TO FACE ENCOUNTER: MEDICARE and MEDICAID PATIENTS: I certify that this patient is under my care and that I had a face-to-face encounter that meets the physician face-to-face encounter requirements with this patient on this date. The encounter with the patient was in whole or in part for the following MEDICAL CONDITION: (primary reason for [place]) MEDICAL NECESSITY: I certify, that based on my findings, NURSING services are a medically necessary home health service. HOME BOUND STATUS: I certify that my clinical findings support that this patient is homebound (i.e., Due to illness or injury, pt requires aid of supportive devices such as crutches, cane, wheelchairs, walkers, the use of special transportation or the assistance of another person to leave their place of residence. There is a normal inability to leave the home and doing so requires considerable and taxing effort. Other absences are for medical reasons / religious services and are infrequent or of short duration when for other reasons). If current dressing causes regression in wound condition, may D/C ordered dressing product/s and apply Normal Saline Moist Dressing daily until next [place] / Other MD appointment. [place] of regression in wound condition at [phone]. Please direct any NON-WOUND related issues/requests for orders to patient's Primary Care Physician Wound #9 Left Ischial Tuberosity: [place] Visits - [facility] Nurse may visit PRN to address patient s wound care needs. FACE TO FACE ENCOUNTER: MEDICARE and MEDICAID PATIENTS: I certify that this patient is under my care and that I had a face-to-face encounter  that meets the physician face-to-face encounter requirements with this patient on this date. The encounter with the patient was in whole or in part for the following MEDICAL CONDITION: (primary reason for [place]) MEDICAL NECESSITY: I certify, that based on my findings, NURSING services are a medically necessary home health service. HOME BOUND STATUS: I certify that my clinical findings support that this patient is homebound (i.e., Due to illness or injury, pt requires aid of supportive devices such as crutches, cane, wheelchairs, walkers, the use of special transportation or the assistance of another person to leave their place of residence. There is a normal inability to leave the home and doing so requires considerable and taxing effort. Other absences are for medical reasons / religious services and are infrequent or of short duration when for other reasons). If current dressing causes regression in wound condition, may D/C ordered dressing product/s and apply Normal Saline Moist Dressing daily until next [place] / Other MD appointment. Notify Wound Cavanagh, Laina H. ([id number]) [place] of regression in wound condition at [phone]. Please direct any NON-WOUND related issues/requests for orders to patient's Primary Care Physician The wounds on her right lower extremity and right hip need Santyl ointment and on her back we will use Sorbact with hydrogel and a bordered foam. Nutrition and vitamin supplements have been discussed. She will come back to see as next week Electronic Signature(s) Signed: 12/22/2015 3:30:46 PM By: Christin Fudge MD, FACS Previous Signature: 12/19/2015 4:27:17 PM Version By: Christin Fudge MD,  FACS Entered By: Christin Fudge on 12/22/2015 15:30:46 [place], Krystal H. ([id number]) -------------------------------------------------------------------------------- SuperBill Details Patient Name: [place], [place] H. Date of Service: 12/19/2015 Medical  Record Number: [id number] Patient Account Number: [id number] Date of Birth/Sex: 01-Apr-1946 (70 y.o. 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, stage 3 I89.0 Lymphedema, not elsewhere classified L97.211 Non-pressure chronic ulcer of right calf limited to breakdown of skin Facility Procedures CPT4 Code: [id number] Description: [id number] - WOUND CARE VISIT-LEV 5 EST PT Modifier: Quantity: 1 Physician Procedures CPT4 Code: [id number] Description: 99213 - WC PHYS LEVEL 3 - EST PT ICD-10 Description Diagnosis 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

## 2016-01-02 ENCOUNTER — Encounter: Payer: Medicare Other | Admitting: Surgery

## 2016-01-02 DIAGNOSIS — E11621 Type 2 diabetes mellitus with foot ulcer: Secondary | ICD-10-CM | POA: Diagnosis not present

## 2016-01-03 NOTE — Progress Notes (Signed)
[name], [name] ([id number]) Visit Report for 01/02/2016 Arrival Information Details Patient Name: [name], [name] Date of Service: 01/02/2016 3:00 PM Medical Record Number: [id number] Patient Account Number: [id number] Date of Birth/Sex: 21-Jan-1946 (70 y.o. Female) Treating RN: [name] Primary Care Physician: [name] Other Clinician: Referring Physician: Paulita [name] Treating Physician/Extender: [name] in Treatment: 27 Visit Information History Since Last Visit Added or deleted any medications: No Patient Arrived: Walker Any new allergies or adverse reactions: No Arrival Time: 15:20 Had a fall or experienced change in No Accompanied By: spouse activities of daily living that may affect Transfer Assistance: Manual risk of falls: Patient Identification Verified: Yes Signs or symptoms of abuse/neglect since last No Secondary Verification Process Completed: Yes visito Patient Requires Transmission-Based No Hospitalized since last visit: No Precautions: Pain Present Now: Yes Patient Has Alerts: No Electronic Signature(s) Signed: 01/02/2016 5:27:15 PM By: [name] Entered By: [name] on 01/02/2016 15:27:10 [name], [name] ([id number]) -------------------------------------------------------------------------------- Clinic Level of Care Assessment Details Patient Name: [name], [name] Date of Service: 01/02/2016 3:00 PM Medical Record Number: [id number] Patient Account Number: [id number] Date of Birth/Sex: 02/14/1946 (70 y.o. Female) Treating RN: [name] Primary Care Physician: [name] Other Clinician: Referring Physician: Paulita [name] Treating Physician/Extender: [name] in Treatment: 42 Clinic Level of Care Assessment Items TOOL 4 Quantity Score []  - Use when only an EandM is performed on FOLLOW-UP visit 0 ASSESSMENTS - Nursing Assessment / Reassessment X - Reassessment of Co-morbidities  (includes updates in patient status) 1 10 X - Reassessment of Adherence to Treatment Plan 1 5 ASSESSMENTS - Wound and Skin Assessment / Reassessment []  - Simple Wound Assessment / Reassessment - one wound 0 X - Complex Wound Assessment / Reassessment - multiple wounds 5 5 []  - Dermatologic / Skin Assessment (not related to wound area) 0 ASSESSMENTS - Focused Assessment []  - Circumferential Edema Measurements - multi extremities 0 []  - Nutritional Assessment / Counseling / Intervention 0 []  - Lower Extremity Assessment (monofilament, tuning fork, pulses) 0 []  - Peripheral Arterial Disease Assessment (using hand held doppler) 0 ASSESSMENTS - Ostomy and/or Continence Assessment and Care []  - Incontinence Assessment and Management 0 []  - Ostomy Care Assessment and Management (repouching, etc.) 0 PROCESS - Coordination of Care X - Simple Patient / Family Education for ongoing care 1 15 []  - Complex (extensive) Patient / Family Education for ongoing care 0 []  - Staff obtains Programmer, systems, Records, Test Results / Process Orders 0 []  - Staff telephones HHA, Nursing Homes / Clarify orders / etc 0 []  - Routine Transfer to another Facility (non-emergent condition) 0 [name], [name] ([id number]) []  - Routine Hospital Admission (non-emergent condition) 0 []  - New Admissions / Biomedical engineer / Ordering NPWT, Apligraf, etc. 0 []  - Emergency Hospital Admission (emergent condition) 0 X - Simple Discharge Coordination 1 10 []  - Complex (extensive) Discharge Coordination 0 PROCESS - Special Needs []  - Pediatric / Minor Patient Management 0 []  - Isolation Patient Management 0 []  - Hearing / Language / Visual special needs 0 []  - Assessment of Community assistance (transportation, D/C planning, etc.) 0 []  - Additional assistance / Altered mentation 0 []  - Support Surface(s) Assessment (bed, cushion, seat, etc.) 0 INTERVENTIONS - Wound Cleansing / Measurement []  - Simple Wound Cleansing - one wound  0 X - Complex Wound Cleansing - multiple wounds 5 5 X - Wound Imaging (photographs - any number of wounds) 1 5 []  - Wound Tracing (instead of photographs) 0 []  -  Simple Wound Measurement - one wound 0 X - Complex Wound Measurement - multiple wounds 5 5 INTERVENTIONS - Wound Dressings X - Small Wound Dressing one or multiple wounds 1 10 []  - Medium Wound Dressing one or multiple wounds 0 []  - Large Wound Dressing one or multiple wounds 0 X - Application of Medications - topical 1 5 []  - Application of Medications - injection 0 INTERVENTIONS - Miscellaneous []  - External ear exam 0 [name], [name] ([id number]) []  - Specimen Collection (cultures, biopsies, blood, body fluids, etc.) 0 []  - Specimen(s) / Culture(s) sent or taken to Lab for analysis 0 []  - Patient Transfer (multiple staff / [name] Lift / Similar devices) 0 []  - Simple Staple / Suture removal (25 or less) 0 []  - Complex Staple / Suture removal (26 or more) 0 []  - Hypo / Hyperglycemic Management (close monitor of Blood Glucose) 0 []  - Ankle / Brachial Index (ABI) - do not check if billed separately 0 X - Vital Signs 1 5 Has the patient been seen at the hospital within the last three years: Yes Total Score: 140 Level Of Care: New/Established - Level 4 Electronic Signature(s) Signed: 01/02/2016 4:56:47 PM By: [name] Entered By: [name] on 01/02/2016 16:56:47 [name], [name] ([id number]) -------------------------------------------------------------------------------- Encounter Discharge Information Details Patient Name: [name], [name] Date of Service: 01/02/2016 3:00 PM Medical Record Number: [id number] Patient Account Number: [id number] Date of Birth/Sex: [birth year] (70 y.o. Female) Treating RN: [name] Primary Care Physician: [name] Other Clinician: Referring Physician: Paulita [name] Treating Physician/Extender: [name] in Treatment: 22 Encounter Discharge Information  Items Discharge Pain Level: 0 Discharge Condition: Stable Ambulatory Status: Walker Discharge Destination: Home Transportation: Private Auto Accompanied By: spouse Schedule Follow-up Appointment: Yes Medication Reconciliation completed No and provided to Patient/Care Osbaldo Mark: Provided on Clinical Summary of Care: 01/02/2016 Form Type Recipient Paper Patient AS Electronic Signature(s) Signed: 01/02/2016 5:00:50 PM By: [name] Previous Signature: 01/02/2016 4:38:57 PM Version By: Ruthine Dose Entered By: [name] on 01/02/2016 17:00:50 [name], [name] ([id number]) -------------------------------------------------------------------------------- Multi Wound Chart Details Patient Name: [name], [name] Date of Service: 01/02/2016 3:00 PM Medical Record Number: [id number] Patient Account Number: [id number] Date of Birth/Sex: [birth year] (70 y.o. 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] [11:Pressure Ulcer] [3:Pressure Ulcer] Comorbid History: [10:Cataracts, Asthma, Hypertension, Type II Diabetes, Neuropathy, Received Chemotherapy] [11:Cataracts, Asthma, Hypertension, Type II Diabetes, Neuropathy, Received Chemotherapy] [3:Cataracts, Asthma, Hypertension, Type II Diabetes,  Neuropathy, Received Chemotherapy] Date Acquired: [10:09/12/2015] [11:09/29/2015] [3:05/02/2015] Weeks of Treatment:  [10:15] [11:13] [3:35] Wound Status: [10:Open] [11:Open] [3:Open] Measurements L x W x D 0.5x0.5x0.1 [11:1.4x1.2x0.1] [3:1.6x2.1x0.3] (cm) Area (cm) : [10:0.196] [11:1.319] [3:2.639] Volume (cm) : [10:0.02] [11:0.132] [3:0.792] % Reduction in Area: [10:16.90%] [11:-86.60%] [3:-1099.50%] % Reduction in Volume: 16.70% [11:-85.90%] [3:-3500.00%] Classification: [10:Category/Stage II] [11:Category/Stage II] [3:Category/Stage II] HBO Classification: [10:Grade 1] [11:Grade 1] [3:N/A] Exudate Amount: [10:Medium] [11:Large] [3:Large] Exudate Type: [10:Serous] [11:Serous] [3:Serosanguineous] Exudate Color: [10:amber] [11:amber] [3:red, brown] Wound Margin: [10:Flat and Intact] [11:Flat and Intact] [3:Distinct, outline attached] Granulation Amount: [10:Medium (34-66%)] [11:Medium (34-66%)] [3:Medium (34-66%)] Granulation Quality: [10:Pink] [11:Pink] [3:Red, Pink]  Necrotic Amount: [10:Medium (34-66%)] [11:Medium (34-66%)] [3:Medium (34-66%)] Exposed Structures: [10:Fascia: No Fat: No Tendon: No Muscle: No] [11:Fascia: No Fat: No Tendon: No Muscle: No] [3:Fascia: No Fat: No Tendon: No Muscle: No] Joint: No Joint: No Joint: No Bone: No Bone: No Bone: No Limited to Skin Limited to Skin Limited to Skin Breakdown Breakdown Breakdown Epithelialization: None None None Periwound Skin Texture: Edema: No Edema: No Edema: No Excoriation: No Excoriation: No Excoriation: No Induration: No Induration: No Induration: No Callus: No Callus: No Callus: No Crepitus: No Crepitus: No Crepitus: No Fluctuance: No Fluctuance: No Fluctuance: No Friable: No Friable: No Friable: No Rash: No Rash: No Rash: No Scarring: No Scarring: No Scarring: No Periwound Skin Moist: Yes Maceration: Yes Moist: Yes Moisture: Dry/Scaly: No Moist: Yes Maceration: No Dry/Scaly: No Dry/Scaly: No Periwound Skin Color: Erythema: Yes Atrophie Blanche: No Atrophie Blanche: No Cyanosis: No Cyanosis:  No Ecchymosis: No Ecchymosis: No Erythema: No Erythema: No Hemosiderin Staining: No Hemosiderin Staining: No Mottled: No Mottled: No Pallor: No Pallor: No Rubor: No Rubor: No Erythema Location: Circumferential N/A N/A Temperature: No Abnormality No Abnormality No Abnormality Tenderness on Yes Yes No Palpation: Wound Preparation: Ulcer Cleansing: Ulcer Cleansing: Ulcer Cleansing: Rinsed/Irrigated with Rinsed/Irrigated with Rinsed/Irrigated with Saline Saline Saline Topical Anesthetic Topical Anesthetic Topical Anesthetic Applied: Other: lidocaine Applied: Other: lidocaine Applied: Other: lidocaine 4% 4% 4% Wound Number: 7 [id number] N/A Photos: No Photos No Photos N/A Wound Location: Right Ischial Tuberosity Left Malleolus N/A Wounding Event: Pressure Injury Gradually Appeared N/A Primary Etiology: Pressure Ulcer Pressure Ulcer N/A Comorbid History: Cataracts, Asthma, Cataracts, Asthma, N/A Hypertension, Type II Hypertension, Type II Diabetes, Neuropathy, Diabetes, Neuropathy, Received Chemotherapy Received Chemotherapy Date Acquired: 08/01/2015 09/12/2015 N/A Weeks of Treatment: 21 15 N/A Wound Status: Open Open N/A 0.9x0.7x0.5 0.6x0.3x0.1 N/A Accardo, Kambri H. ([id number]) Measurements L x W x D (cm) Area (cm) : 0.495 0.141 N/A Volume (cm) : 0.247 0.014 N/A % Reduction in Area: 84.20% -200.00% N/A % Reduction in Volume: 21.30% -55.60% N/A Classification: Category/Stage III Category/Stage II N/A HBO Classification: N/A Grade 0 N/A Exudate Amount: Large Large N/A Exudate Type: Serous Serous N/A Exudate Color: amber amber N/A Wound Margin: Distinct, outline attached Flat and Intact N/A Granulation Amount: Medium (34-66%) Medium (34-66%) N/A Granulation Quality: Red, Pink Red, Pink N/A Necrotic Amount: Medium (34-66%) Medium (34-66%) N/A Exposed Structures: Fascia: No Fascia: No N/A Fat: No Fat: No Tendon: No Tendon: No Muscle: No Muscle: No Joint: No Joint:  No Bone: No Bone: No Limited to Skin Limited to Skin Breakdown Breakdown Epithelialization: None None N/A Periwound Skin Texture: Edema: No Edema: No N/A Excoriation: No Excoriation: No Induration: No Induration: No Callus: No Callus: No Crepitus: No Crepitus: No Fluctuance: No Fluctuance: No Friable: No Friable: No Rash: No Rash: No Scarring: No Scarring: No Periwound Skin Moist: Yes Moist: Yes N/A Moisture: Maceration: No Maceration: No Dry/Scaly: No Dry/Scaly: No Periwound Skin Color: Atrophie Blanche: No Erythema: Yes N/A Cyanosis: No Atrophie Blanche: No Ecchymosis: No Cyanosis: No Erythema: No Ecchymosis: No Hemosiderin Staining: No Hemosiderin Staining: No Mottled: No Mottled: No Pallor: No Pallor: No Rubor: No Rubor: No Erythema Location: N/A Circumferential N/A Temperature: No Abnormality No Abnormality N/A Tenderness on Yes Yes N/A Palpation: Wound Preparation: Ulcer Cleansing: Ulcer Cleansing: N/A Rinsed/Irrigated with Rinsed/Irrigated with [name], [name] ([id number]) Saline Saline Topical Anesthetic Topical Anesthetic Applied: Other: lidocaine Applied: Other: Lidocaine 4% 4% Treatment Notes Electronic Signature(s) Signed: 01/02/2016 5:27:15 PM By: [name] Entered By: [name] on 01/02/2016 15:53:26  Crenshaw, Dimple H. ([id number]) -------------------------------------------------------------------------------- Multi-Disciplinary Care Plan Details Patient Name: COFFEE, Shakeela H. Date of Service: 01/02/2016 3:00 PM Medical Record Number: [id number] Patient Account Number: [id number] Date of Birth/Sex: August 01, 1946 (70 y.o. Female) Treating RN: [name] Primary Care Physician: [name] Other Clinician: Referring Physician: Paulita [name] Treating Physician/Extender: [name] in Treatment: 80 Active Inactive Abuse / Safety / Falls / Self Care Management Nursing Diagnoses: Impaired home  maintenance Impaired physical mobility Knowledge deficit related to: safety; personal, health (wound), emergency Potential for falls Self care deficit: actual or potential Goals: Patient will remain injury free Date Initiated: 03/13/2015 Goal Status: Active Patient/caregiver will verbalize understanding of skin care regimen Date Initiated: 03/13/2015 Goal Status: Active Patient/caregiver will verbalize/demonstrate measure taken to improve self care Date Initiated: 03/13/2015 Goal Status: Active Patient/caregiver will verbalize/demonstrate measures taken to improve the patient's personal safety Date Initiated: 03/13/2015 Goal Status: Active Patient/caregiver will verbalize/demonstrate measures taken to prevent injury and/or falls Date Initiated: 03/13/2015 Goal Status: Active Patient/caregiver will verbalize/demonstrate understanding of what to do in case of emergency Date Initiated: 03/13/2015 Goal Status: Active Interventions: Assess fall risk on admission and as needed Assess: immobility, friction, shearing, incontinence upon admission and as needed Assess impairment of mobility on admission and as needed per policy Assess self care needs on admission and as needed Provide education on basic hygiene Mcmeans, [place]. ([id number]) Provide education on fall prevention Provide education on personal and home safety Provide education on safe transfers Treatment Activities: Education provided on Basic Hygiene : 03/13/2015 Notes: Orientation to the Wound Care Program Nursing Diagnoses: Knowledge deficit related to the wound healing center program Goals: Patient/caregiver will verbalize understanding of the [place] Date Initiated: 03/13/2015 Goal Status: Active Interventions: Provide education on orientation to the wound center Notes: Pressure Nursing Diagnoses: Knowledge deficit related to causes and risk factors for pressure ulcer development Knowledge deficit  related to management of pressures ulcers Potential for impaired tissue integrity related to pressure, friction, moisture, and shear Goals: Patient will remain free from development of additional pressure ulcers Date Initiated: 03/13/2015 Goal Status: Active Patient will remain free of pressure ulcers Date Initiated: 03/13/2015 Goal Status: Active Patient/caregiver will verbalize risk factors for pressure ulcer development Date Initiated: 03/13/2015 Goal Status: Active Patient/caregiver will verbalize understanding of pressure ulcer management Date Initiated: 03/13/2015 Goal Status: Active Interventions: Assess: immobility, friction, shearing, incontinence upon admission and as needed Foulkes, Teliah H. ([id number]) Assess offloading mechanisms upon admission and as needed Assess potential for pressure ulcer upon admission and as needed Provide education on pressure ulcers Treatment Activities: Patient referred for home evaluation of offloading devices/mattresses : 01/02/2016 Patient referred for pressure reduction/relief devices : 01/02/2016 Patient referred for seating evaluation to ensure proper offloading : 01/02/2016 Pressure reduction/relief device ordered : 01/02/2016 Test ordered outside of clinic : 01/02/2016 Notes: Wound/Skin Impairment Nursing Diagnoses: Impaired tissue integrity Knowledge deficit related to ulceration/compromised skin integrity Goals: Patient/caregiver will verbalize understanding of skin care regimen Date Initiated: 03/13/2015 Goal Status: Active Ulcer/skin breakdown will have a volume reduction of 30% by week 4 Date Initiated: 03/13/2015 Goal Status: Active Ulcer/skin breakdown will have a volume reduction of 50% by week 8 Date Initiated: 03/13/2015 Goal Status: Active Ulcer/skin breakdown will have a volume reduction of 80% by week 12 Date Initiated: 03/13/2015 Goal Status: Active Ulcer/skin breakdown will heal within 14 weeks Date Initiated:  03/13/2015 Goal Status: Active Interventions: Assess patient/caregiver ability to obtain necessary supplies Assess patient/caregiver ability to perform ulcer/skin care regimen upon admission  and as needed Assess ulceration(s) every visit Provide education on smoking Provide education on ulcer and skin care Treatment Activities: Patient referred to home care : 01/02/2016 [name], [name] ([id number]) Referred to DME Parthiv Mucci for dressing supplies : 01/02/2016 Skin care regimen initiated : 01/02/2016 Topical wound management initiated : 01/02/2016 Notes: Electronic Signature(s) Signed: 01/02/2016 5:27:15 PM By: [name] Entered By: [name] on 01/02/2016 15:53:16 Madl, [name] H. ([id number]) -------------------------------------------------------------------------------- Patient/Caregiver Education Details Patient Name: [name], Laurisa H. Date of Service: 01/02/2016 3:00 PM Medical Record Number: [id number] Patient Account Number: [id number] Date of Birth/Gender: 1946/05/14 (70 y.o. Female) Treating RN: [name] Primary Care Physician: [name] Other Clinician: Referring Physician: Paulita [name] Treating Physician/Extender: [name] in Treatment: 50 Education Assessment Education Provided To: Caregiver Education Topics Provided Wound/Skin Impairment: Handouts: Other: wound care as ordered Methods: Demonstration, Explain/Verbal, Printed Responses: State content correctly Electronic Signature(s) Signed: 01/02/2016 5:01:12 PM By: [name] Entered By: [name] on 01/02/2016 17:01:11 Boeckman, [place]. ([id number]) -------------------------------------------------------------------------------- Wound Assessment Details Patient Name: Bunte, Orva H. Date of Service: 01/02/2016 3:00 PM Medical Record Number: [id number] Patient Account Number: [id number] Date of Birth/Sex: 06-15-1946 (70 y.o. Female) Treating RN: [name] Primary Care  Physician: [name] Other Clinician: Referring Physician: Paulita [name] Treating Physician/Extender: [name] in Treatment: 42 Wound Status Wound Number: 10 Primary Pressure Ulcer Etiology: Wound Location: Right Foot - Medial Wound Open Wounding Event: Gradually Appeared Status: Date Acquired: 09/12/2015 Comorbid Cataracts, Asthma, Hypertension, Type Weeks Of Treatment: 15 History: II Diabetes, Neuropathy, Received Clustered Wound: No Chemotherapy Photos Photo Uploaded By: [name] on 01/02/2016 17:04:34 Wound Measurements Length: (cm) 0.5 Width: (cm) 0.5 Depth: (cm) 0.1 Area: (cm) 0.196 Volume: (cm) 0.02 % Reduction in Area: 16.9% % Reduction in Volume: 16.7% Epithelialization: None Tunneling: No Undermining: No Wound Description Classification: Category/Stage II Foul Odor Af Diabetic Severity (Wagner): Grade 1 Wound Margin: Flat and Intact Exudate Amount: Medium Exudate Type: Serous Exudate Color: amber ter Cleansing: No Wound Bed Granulation Amount: Medium (34-66%) Exposed Structure Granulation Quality: Pink Fascia Exposed: No Necrotic Amount: Medium (34-66%) Fat Layer Exposed: No Kriz, Nekeya H. ([id number]) Necrotic Quality: Adherent Slough Tendon Exposed: No Muscle Exposed: No Joint Exposed: No Bone Exposed: No Limited to Skin Breakdown Periwound Skin Texture Texture Color No Abnormalities Noted: No No Abnormalities Noted: No Callus: No Erythema: Yes Crepitus: No Erythema Location: Circumferential Excoriation: No Temperature / Pain Fluctuance: No Temperature: No Abnormality Friable: No Tenderness on Palpation: Yes Induration: No Localized Edema: No Rash: No Scarring: No Moisture No Abnormalities Noted: No Dry / Scaly: No Moist: Yes Wound Preparation Ulcer Cleansing: Rinsed/Irrigated with Saline Topical Anesthetic Applied: Other: lidocaine 4%, Treatment Notes Wound #10 (Right, Medial Foot) 1.  Cleansed with: Clean wound with Normal Saline 2. Anesthetic Topical Lidocaine 4% cream to wound bed prior to debridement 4. Dressing Applied: Santyl Ointment 5. Secondary Dressing Applied Bordered Foam Dressing Electronic Signature(s) Signed: 01/02/2016 5:27:15 PM By: [name] Entered By: [name] on 01/02/2016 15:51:38 Haddix, Hazleigh H. ([id number]) -------------------------------------------------------------------------------- Wound Assessment Details Patient Name: Mcpartland, Carrie H. Date of Service: 01/02/2016 3:00 PM Medical Record Number: [id number] Patient Account Number: [id number] Date of Birth/Sex: [birth year] (70 y.o. Female) Treating RN: [name] Primary Care Physician: [name] Other Clinician: Referring Physician: Paulita [name] Treating Physician/Extender: [name] in Treatment: 42 Wound Status Wound Number: 11 Primary Pressure Ulcer Etiology: Wound Location: Right Calcaneus - Distal Wound Open Wounding Event: Pressure Injury Status: Date Acquired: 09/29/2015 Comorbid Cataracts, Asthma, Hypertension, Type  Weeks Of Treatment: 13 History: II Diabetes, Neuropathy, Received Clustered Wound: No Chemotherapy Photos Photo Uploaded By: [name] on 01/02/2016 17:05:09 Wound Measurements Length: (cm) 1.4 Width: (cm) 1.2 Depth: (cm) 0.1 Area: (cm) 1.319 Volume: (cm) 0.132 % Reduction in Area: -86.6% % Reduction in Volume: -85.9% Epithelialization: None Tunneling: No Undermining: No Wound Description Classification: Category/Stage II Foul Odor Af Diabetic Severity (Wagner): Grade 1 Wound Margin: Flat and Intact Exudate Amount: Large Exudate Type: Serous Exudate Color: amber ter Cleansing: No Wound Bed Granulation Amount: Medium (34-66%) Exposed Structure Granulation Quality: Pink Fascia Exposed: No Necrotic Amount: Medium (34-66%) Fat Layer Exposed: No Funchess, Jewell H. ([id number]) Necrotic Quality: Adherent  Slough Tendon Exposed: No Muscle Exposed: No Joint Exposed: No Bone Exposed: No Limited to Skin Breakdown Periwound Skin Texture Texture Color No Abnormalities Noted: No No Abnormalities Noted: No Callus: No Atrophie Blanche: No Crepitus: No Cyanosis: No Excoriation: No Ecchymosis: No Fluctuance: No Erythema: No Friable: No Hemosiderin Staining: No Induration: No Mottled: No Localized Edema: No Pallor: No Rash: No Rubor: No Scarring: No Temperature / Pain Moisture Temperature: No Abnormality No Abnormalities Noted: No Tenderness on Palpation: Yes Dry / Scaly: No Maceration: Yes Moist: Yes Wound Preparation Ulcer Cleansing: Rinsed/Irrigated with Saline Topical Anesthetic Applied: Other: lidocaine 4%, Treatment Notes Wound #11 (Right, Distal Calcaneus) 1. Cleansed with: Clean wound with Normal Saline 2. Anesthetic Topical Lidocaine 4% cream to wound bed prior to debridement 4. Dressing Applied: Foam 5. Secondary Dressing Applied Bordered Foam Dressing Electronic Signature(s) Signed: 01/02/2016 5:27:15 PM By: [name] Entered By: [name] on 01/02/2016 15:51:56 Santone, Zoriah H. ([id number]) -------------------------------------------------------------------------------- Wound Assessment Details Patient Name: Schleyer, Keyarra H. Date of Service: 01/02/2016 3:00 PM Medical Record Number: [id number] Patient Account Number: [id number] Date of Birth/Sex: 01/01/1946 (70 y.o. Female) Treating RN: [name] Primary Care Physician: [name] Other Clinician: Referring Physician: Paulita [name] Treating Physician/Extender: [name] in Treatment: 42 Wound Status Wound Number: 3 Primary Pressure Ulcer Etiology: Wound Location: Back - Midline Wound Open Wounding Event: Pressure Injury Status: Date Acquired: 05/02/2015 Comorbid Cataracts, Asthma, Hypertension, Type Weeks Of Treatment: 35 History: II Diabetes, Neuropathy,  Received Clustered Wound: No Chemotherapy Photos Photo Uploaded By: [name] on 01/02/2016 17:05:10 Wound Measurements Length: (cm) 1.6 Width: (cm) 2.1 Depth: (cm) 0.3 Area: (cm) 2.639 Volume: (cm) 0.792 % Reduction in Area: -1099.5% % Reduction in Volume: -3500% Epithelialization: None Tunneling: No Undermining: No Wound Description Classification: Category/Stage II Wound Margin: Distinct, outline attached Exudate Amount: Large Exudate Type: Serosanguineous Exudate Color: red, brown Foul Odor After Cleansing: No Wound Bed Granulation Amount: Medium (34-66%) Exposed Structure Granulation Quality: Red, Pink Fascia Exposed: No Necrotic Amount: Medium (34-66%) Fat Layer Exposed: No Necrotic Quality: Adherent Slough Tendon Exposed: No Felkins, Venera H. ([id number]) Muscle Exposed: No Joint Exposed: No Bone Exposed: No Limited to Skin Breakdown Periwound Skin Texture Texture Color No Abnormalities Noted: No No Abnormalities Noted: No Callus: No Atrophie Blanche: No Crepitus: No Cyanosis: No Excoriation: No Ecchymosis: No Fluctuance: No Erythema: No Friable: No Hemosiderin Staining: No Induration: No Mottled: No Localized Edema: No Pallor: No Rash: No Rubor: No Scarring: No Temperature / Pain Moisture Temperature: No Abnormality No Abnormalities Noted: No Dry / Scaly: No Maceration: No Moist: Yes Wound Preparation Ulcer Cleansing: Rinsed/Irrigated with Saline Topical Anesthetic Applied: Other: lidocaine 4%, Treatment Notes Wound #3 (Midline Back) 1. Cleansed with: Clean wound with Normal Saline 2. Anesthetic Topical Lidocaine 4% cream to wound bed prior to debridement 4. Dressing Applied: Santyl Ointment 5.  Secondary Dressing Applied Bordered Foam Dressing Electronic Signature(s) Signed: 01/02/2016 5:27:15 PM By: [name] Entered By: [name] on 01/02/2016 15:52:15 Tiznado, Ameriah H.  ([id number]) -------------------------------------------------------------------------------- Wound Assessment Details Patient Name: Retana, Camera H. Date of Service: 01/02/2016 3:00 PM Medical Record Number: [id number] Patient Account Number: [id number] Date of Birth/Sex: 1946/06/24 (70 y.o. 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 Wound Open Wounding Event: Pressure Injury Status: Date Acquired: 08/01/2015 Comorbid Cataracts, Asthma, Hypertension, Type Weeks Of Treatment: 21 History: II Diabetes, Neuropathy, Received Clustered Wound: No Chemotherapy Photos Photo Uploaded By: [name] on 01/02/2016 17:05:42 Wound Measurements Length: (cm) 0.9 Width: (cm) 0.7 Depth: (cm) 0.5 Area: (cm) 0.495 Volume: (cm) 0.247 % Reduction in Area: 84.2% % Reduction in Volume: 21.3% Epithelialization: None Tunneling: No Undermining: No Wound Description Classification: Category/Stage III Wound Margin: Distinct, outline attached Exudate Amount: Large Exudate Type: Serous Exudate Color: amber Foul Odor After Cleansing: No Wound Bed Granulation Amount: Medium (34-66%) Exposed Structure Granulation Quality: Red, Pink Fascia Exposed: No Necrotic Amount: Medium (34-66%) Fat Layer Exposed: No Necrotic Quality: Adherent Slough Tendon Exposed: No Hamric, Crystalmarie H. ([id number]) Muscle Exposed: No Joint Exposed: No Bone Exposed: No Limited to Skin Breakdown Periwound Skin Texture Texture Color No Abnormalities Noted: No No Abnormalities Noted: No Callus: No Atrophie Blanche: No Crepitus: No Cyanosis: No Excoriation: No Ecchymosis: No Fluctuance: No Erythema: No Friable: No Hemosiderin Staining: No Induration:  No Mottled: No Localized Edema: No Pallor: No Rash: No Rubor: No Scarring: No Temperature / Pain Moisture Temperature: No Abnormality No Abnormalities Noted: No Tenderness on Palpation: Yes Dry / Scaly: No Maceration: No Moist: Yes Wound Preparation Ulcer Cleansing: Rinsed/Irrigated with Saline Topical Anesthetic Applied: Other: lidocaine 4%, Treatment Notes Wound #7 (Right Ischial Tuberosity) 1. Cleansed with: Clean wound with Normal Saline 2. Anesthetic Topical Lidocaine 4% cream to wound bed prior to debridement 4. Dressing Applied: Santyl Ointment 5. Secondary Dressing Applied Bordered Foam Dressing Electronic Signature(s) Signed: 01/02/2016 5:27:15 PM By: [name] Entered By: [name] on 01/02/2016 15:52:37 Bartelson, Ambar H. ([id number]) -------------------------------------------------------------------------------- Wound Assessment Details Patient Name: Openshaw, Lateka H. Date of Service: 01/02/2016 3:00 PM Medical Record Number: [id number] Patient Account Number: [id number] Date of Birth/Sex: May 04, 1946 (70 y.o. Female) Treating RN: [name] Primary Care Physician: [name] Other Clinician: Referring Physician: Paulita [name] Treating Physician/Extender: [name] in Treatment: 42 Wound Status Wound Number: 8 Primary Pressure Ulcer Etiology: Wound Location: Left Malleolus Wound Open Wounding Event: Gradually Appeared Status: Date Acquired: 09/12/2015 Comorbid Cataracts, Asthma, Hypertension, Type Weeks Of Treatment: 15 History: II Diabetes, Neuropathy, Received Clustered Wound: No Chemotherapy Photos Photo Uploaded By: [name] on 01/02/2016 17:05:43 Wound Measurements Length: (cm) 0.6 Width: (cm) 0.3 Depth: (cm) 0.1 Area: (cm) 0.141 Volume: (cm) 0.014 % Reduction in Area: -200% % Reduction in Volume: -55.6% Epithelialization: None Tunneling: No Undermining: No Wound Description Classification:  Category/Stage II Foul Odor Af Diabetic Severity (Wagner): Grade 0 Wound Margin: Flat and Intact Exudate Amount: Large Exudate Type: Serous Exudate Color: amber ter Cleansing: No Wound Bed Granulation Amount: Medium (34-66%) Exposed Structure Granulation Quality: Red, Pink Fascia Exposed: No Necrotic Amount: Medium (34-66%) Fat Layer Exposed: No Casebier, Jetaime H. ([id number]) Necrotic Quality: Adherent Slough Tendon Exposed: No Muscle Exposed: No Joint Exposed: No Bone Exposed: No Limited to Skin Breakdown Periwound Skin Texture Texture Color  No Abnormalities Noted: No No Abnormalities Noted: No Callus: No Atrophie Blanche: No Crepitus: No Cyanosis: No Excoriation: No Ecchymosis: No Fluctuance: No Erythema: Yes Friable: No Erythema Location: Circumferential Induration: No Hemosiderin Staining: No Localized Edema: No Mottled: No Rash: No Pallor: No Scarring: No Rubor: No Moisture Temperature / Pain No Abnormalities Noted: No Temperature: No Abnormality Dry / Scaly: No Tenderness on Palpation: Yes Maceration: No Moist: Yes Wound Preparation Ulcer Cleansing: Rinsed/Irrigated with Saline Topical Anesthetic Applied: Other: Lidocaine 4%, Treatment Notes Wound #8 (Left Malleolus) 1. Cleansed with: Clean wound with Normal Saline 2. Anesthetic Topical Lidocaine 4% cream to wound bed prior to debridement 4. Dressing Applied: Prisma Ag 5. Secondary Dressing Applied Bordered Foam Dressing Electronic Signature(s) Signed: 01/02/2016 5:27:15 PM By: [name] Entered By: [name] on 01/02/2016 15:53:03 Dobosz, Gesselle H. ([id number]) -------------------------------------------------------------------------------- Vitals Details Patient Name: [name], Quintana H. Date of Service: 01/02/2016 3:00 PM Medical Record Number: [id number] Patient Account Number: [id number] Date of Birth/Sex: 24-Apr-1946 (70 y.o. Female) Treating RN: [name] Primary Care Physician:  [name] Other Clinician: Referring Physician: Paulita [name] Treating Physician/Extender: [name] in Treatment: 42 Vital Signs Time Taken: 15:28 Pulse (bpm): 101 Height (in): 65 Respiratory Rate (breaths/min): 16 Weight (lbs): 100 Blood Pressure (mmHg): 102/71 Body Mass Index (BMI): 16.6 Reference Range: 80 - 120 mg / dl Electronic Signature(s) Signed: 01/02/2016 5:27:15 PM By: [name] Entered By: [name] on 01/02/2016 15:29:11

## 2016-01-03 NOTE — Progress Notes (Addendum)
[name] Visit Report for 01/02/2016 Chief Complaint Document Details Patient Name: [name], [name] Date of Service: 01/02/2016 3:00 PM Medical Record Number: [id number] Patient Account Number: [id number] Date of Birth/Sex: 1946-07-20 (70 y.o. Female) Treating RN: Montey Hora Primary Care Physician: Paulita Cradle Other Clinician: Referring Physician: Paulita Cradle Treating Physician/Extender: Frann Rider in Treatment: 12 Information Obtained from: Patient Chief Complaint Patient presents to the wound care center for a consult due non healing wound. Ulcers on the right elbow and the right heel for about 1 month. Electronic Signature(s) Signed: 01/02/2016 4:34:46 PM By: Christin Fudge MD, FACS Entered By: Christin Fudge on 01/02/2016 16:34:46 [name] -------------------------------------------------------------------------------- HPI Details Patient Name: [name], [name] Date of Service: 01/02/2016 3:00 PM Medical Record Number: [id number] Patient Account Number: [id number] Date of Birth/Sex: June 25, 1946 (70 y.o. Female) Treating RN: Montey Hora Primary Care Physician: Paulita Cradle Other Clinician: Referring Physician: Paulita Cradle Treating Physician/Extender: Frann Rider in Treatment: 75 History of Present Illness Location: Ulceration on the right heel and the right elbow. Quality: Patient reports experiencing a dull pain to affected area(s). Severity: Patient states wound (s) are getting better. Duration: Patient has had the wound for < 4 weeks prior to presenting for treatment Timing: Pain in wound is Intermittent (comes and goes Context: The wound appeared gradually over time Modifying Factors: Consults to this date include:Augmentin and Bactrim and also some heel protection with duoderm Associated Signs and Symptoms: Patient reports having difficulty standing for long periods. HPI Description:  70 year old female with history of peripheral neuropathy, history of diet controlled diabetes mellitus type 2, history of alcoholism here for wound consult sent by her PCP Dr. Sherilyn Cooter. She has pressure ulcers at her right elbow, bilateral heels. Plain films of right calcaneus without acute bony process. Patient started by PCP on Augmentin, Bactrim as per orders, DuoDerm dressings applied - reports some improvement in her ulcer since last seen. Denies fever, chills, nausea, vomiting, diarrhea. She had a right humerus fracture in the middle of May and has had no surgery and arm is in a sling. She is also been laying in the bed for quite a while. Past medical history significant for essential hypertension, osteoporosis, peripheral neuropathy, alcoholism, ataxia, personal history of breast cancer treated with surgery chemotherapy and radiation and this was done in December 2010. she is also status post laparoscopic cholecystectomy, pilonidal cyst excision, subcutaneous port placement, partial mastectomy on the left side, skin cancer removal. 03/21/2015 -- she says overall she's been doing better and continues to smoke about 15 cigarettes a day. 03/21/2015 - her orthopedic doctor has said she may require surgery for her right humerus fracture. 04/04/2015 -- her orthopedic surgery has been scheduled for August 11. 04/18/2015 -- she is doing fine as far as her elbow and her right heel goes but she has developed some redness over prominence on her thoracic spine and wanted me to take a look at this. 05/02/2015 -- she had her surgery done and now is in a sling and support. Her back has developed a pressure injury of undetermined stage. She seems to be in better spirits. 05/16/2015 -- last week her right heel was looking great and we had healed it out, but she has not been offloading appropriately and has a deep tissue injury on the right heel again. The area on her right elbow has opened out with slough and  the area in the thoracic spine is also getting worse. 05/30/2015 -- she has developed  2 new ulcerations one on her left ischial tuberosity and one on the sacral region. She is still working on getting up smoking but is also unable to take her vitamins as she says she develops a diarrhea when she takes vitamins. She has increased her intake of proteins. 06/06/2015 -- the patient's husband manages to get her a low air loss mattress with initiating pressure but [name], [name] did not know how to use it exactly and the patient was not happy about using it. Overall she says she's been feeling better. 07/11/2015 -- . Discussed a surgical opinion for debridement and application of a wound VAC, 2 weeks ago but the patient has been reluctant to get a surgical opinion as she wants to avoid surgery. 07/18/2015 -- they have an appointment to see Dr. Tamala Julian this coming Wednesday. 07/29/2015 -- they saw Dr. Tamala Julian in his office and he did a debridement of the wound and removed significant amount of slough. This is in addition to the debridement I had done previously on Friday where a large amount of the eschar was removed. He did not recommend the application of wound VAC. Addendum : Dr. Thompson Caul note was reviewed via EPIC and details noted as above. 08/05/2015 -- over the last week she has developed a another pressure injury to her right hip and has had significant discoloration of the skin and an eschar there. 08/15/2015 -- she is still smoking about a pack of cigarettes a day and does not seem to want to quit. They have not been able to talk to the vendor regarding the air mattress and I will ask them to get in touch again. She is reluctant to take vitamins and does admit her nutrition is poor. 08/22/2015 -- she has been unable to tolerate her vitamins and continues to smoke. They're working on getting a low air loss mattress and have been speaking with the vendor. 09/19/2015 -- since her last  visit and was admitted to the hospital between 09/09/2015 and 09/16/2015. She was thought to have an active sepsis possibly from one of her decubitus ulcers but nothing was grown except for an MSSA from her thoracic spine region. CT of this area did not show any osteomyelitis. Been given IV antibiotics which included vancomycin and Zosyn in the hospital under the care of Dr. [name] the ID specialist she was sent home on oral Keflex 500 mg 3 times a day for 2 weeks. he will consider an MRI in the outpatient setting to completely rule out osteomyelitis of the spine. 10/03/2015 --readmitted to hospital on 09/23/2015 for general debility, lethargy and possible sepsis and workup was in progress. Seen by Dr. [name] and he would also like a workup for underlying malignancy as sheos had previous ultrasounds of the breasts suggesting findings of concern. Workup done so far does not suggest deep bony infection. She was treated for a pneumonia and received Zosyn and vancomycin. She had been recommended Cipro 500 twice a day and doxycycline 100 mg twice a day once she was to be [place] home. The antibiotics were to be stopped on 10/07/2015. 10/17/2015 -- patient is feeling much better and has been eating better and doing her offloading as much as possible. 10/23/2015 -- she has an appointment to see Dr. [name] tomorrow but other than that has been doing as much as possible with offloading and increasing her diet. 11/07/2015 -- she has not been here to see [place] for 2 weeks and at the present time continues to  try and eat better, work on off loading and is using the air mattress at night. She saw her PCP yesterday and she has put her back on a fentanyl patch. 11/21/2015 -- for some strange reason she has been sitting in a chair with her legs dependent for the last 6 days nonstop and has developed massive lower extremity edema and pedal edema with weeping and ulceration. Electronic  Signature(s) Signed: 01/02/2016 4:34:50 PM By: Christin Fudge MD, FACS Entered By: Christin Fudge on 01/02/2016 16:34:50 [name], [name] -------------------------------------------------------------------------------- Physical Exam Details Patient Name: [name], [name] Date of Service: 01/02/2016 3:00 PM Medical Record Number: [id number] Patient Account Number: [id number] Date of Birth/Sex: [birth year] (70 y.o. Female) Treating RN: Montey Hora Primary Care Physician: Paulita Cradle Other Clinician: Referring Physician: Paulita Cradle Treating Physician/Extender: Frann Rider in Treatment: 42 Constitutional . Pulse regular. Respirations normal and unlabored. Afebrile. . Eyes Nonicteric. Reactive to light. Ears, Nose, Mouth, and Throat Lips, teeth, and gums WNL.Marland Kitchen Moist mucosa without lesions. Neck supple and nontender. No palpable supraclavicular or cervical adenopathy. Normal sized without goiter. Respiratory WNL. No retractions.. Cardiovascular Pedal Pulses WNL. No clubbing, cyanosis or edema. Lymphatic No adneopathy. No adenopathy. No adenopathy. Musculoskeletal Adexa without tenderness or enlargement.. Digits and nails w/o clubbing, cyanosis, infection, petechiae, ischemia, or inflammatory conditions.. Integumentary (Hair, Skin) No suspicious lesions. No crepitus or fluctuance. No peri-wound warmth or erythema. No masses.Marland Kitchen Psychiatric Judgement and insight Intact.. No evidence of depression, anxiety, or agitation.. Notes she seemed to have a fungal infection in the right forefoot and in the web spaces between the toes. The wound on the medial part has some hyper granulation tissue and silver nitrate was used there. The wound on her back has some subcutaneous debris and we will use Santyl there. Right hip wound will need a packing strip and Santyl. Electronic Signature(s) Signed: 01/02/2016 4:36:01 PM By: Christin Fudge MD, FACS Entered By: Christin Fudge on 01/02/2016 16:36:01 [name], Mckenzie HMarland Kitchen ([id number]) -------------------------------------------------------------------------------- Physician Orders Details Patient Name: [name], [name] Date of Service: 01/02/2016 3:00 PM Medical Record Number: [id number] Patient Account Number: [id number] Date of Birth/Sex: September 18, 1945 (70 y.o. Female) Treating RN: Montey Hora Primary Care Physician: Paulita Cradle Other Clinician: Referring Physician: Paulita Cradle Treating Physician/Extender: Frann Rider in Treatment: 71 Verbal / Phone Orders: Yes Clinician: Montey Hora Read Back and Verified: Yes Diagnosis Coding Wound Cleansing Wound #10 Right,Medial Foot o Clean wound with Normal Saline. Wound #11 Right,Distal Calcaneus o Clean wound with Normal Saline. Wound #3 Midline Back o Clean wound with Normal Saline. Wound #7 Right Ischial Tuberosity o Clean wound with Normal Saline. Wound #8 Left Malleolus o Clean wound with Normal Saline. Anesthetic Wound #10 Right,Medial Foot o Topical Lidocaine 4% cream applied to wound bed prior to debridement Wound #11 Right,Distal Calcaneus o Topical Lidocaine 4% cream applied to wound bed prior to debridement Wound #3 Midline Back o Topical Lidocaine 4% cream applied to wound bed prior to debridement Wound #7 Right Ischial Tuberosity o Topical Lidocaine 4% cream applied to wound bed prior to debridement Wound #8 Left Malleolus o Topical Lidocaine 4% cream applied to wound bed prior to debridement Primary Wound Dressing Wound #10 Right,Medial Foot o Santyl Ointment - R ischial tuberosity also pack lightly with packing strip Wound #3 Midline Back Goynes, Cathryn [name]. ([id number]) o Santyl Ointment - R ischial tuberosity also pack lightly with packing strip Wound #7 Right Ischial Tuberosity o Santyl Ointment - R ischial tuberosity also pack lightly with packing  strip Wound #11 Right,Distal Calcaneus o  Other: - foam Wound #8 Left Malleolus o Prisma Ag Secondary Dressing Wound #10 Right,Medial Foot o Boardered Foam Dressing Wound #11 Right,Distal Calcaneus o Boardered Foam Dressing Wound #3 Midline Back o Boardered Foam Dressing Wound #7 Right Ischial Tuberosity o Boardered Foam Dressing Wound #8 Left Malleolus o Boardered Foam Dressing Dressing Change Frequency Wound #10 Right,Medial Foot o Change dressing every day. Wound #11 Right,Distal Calcaneus o Change dressing every day. Wound #3 Midline Back o Change dressing every day. Wound #7 Right Ischial Tuberosity o Change dressing every day. Wound #8 Left Malleolus o Change dressing every day. Follow-up Appointments Wound #10 Right,Medial Foot o Return Appointment in 1 week. Wound #11 Right,Distal Calcaneus Miron, Kewanda [name]. ([id number]) o Return Appointment in 1 week. Wound #3 Midline Back o Return Appointment in 1 week. Wound #7 Right Ischial Tuberosity o Return Appointment in 1 week. Wound #8 Left Malleolus o Return Appointment in 1 week. Home Health Wound #10 [place] Visits - [place] Nurse may visit PRN to address patientos wound care needs. o FACE TO FACE ENCOUNTER: MEDICARE and MEDICAID PATIENTS: I certify that this patient is under my care and that I had a face-to-face encounter that meets the physician face-to-face encounter requirements with this patient on this date. The encounter with the patient was in whole or in part for the following MEDICAL CONDITION: (primary reason for [place]) MEDICAL NECESSITY: I certify, that based on my findings, NURSING services are a medically necessary home health service. HOME BOUND STATUS: I certify that my clinical findings support that this patient is homebound (i.e., Due to illness or injury, pt requires aid of supportive devices such as crutches, cane, wheelchairs, walkers, the use  of special transportation or the assistance of another person to leave their place of residence. There is a normal inability to leave the home and doing so requires considerable and taxing effort. Other absences are for medical reasons / religious services and are infrequent or of short duration when for other reasons). o If current dressing causes regression in wound condition, may D/C ordered dressing product/s and apply Normal Saline Moist Dressing daily until next [place] / Other MD appointment. [place] of regression in wound condition at [phone]. o Please direct any NON-WOUND related issues/requests for orders to patient's Primary Care Physician Wound #11 [place] Visits - [place] Nurse may visit PRN to address patientos wound care needs. o FACE TO FACE ENCOUNTER: MEDICARE and MEDICAID PATIENTS: I certify that this patient is under my care and that I had a face-to-face encounter that meets the physician face-to-face encounter requirements with this patient on this date. The encounter with the patient was in whole or in part for the following MEDICAL CONDITION: (primary reason for [place]) MEDICAL NECESSITY: I certify, that based on my findings, NURSING services are a medically necessary home health service. HOME BOUND STATUS: I certify that my clinical findings support that this patient is homebound (i.e., Due to illness or injury, pt requires aid of supportive devices such as crutches, cane, wheelchairs, walkers, the use of special transportation or the assistance of another person to leave their place of residence. There is a normal inability to leave the home and doing so requires considerable and taxing effort. Other absences are for medical reasons / religious services and are infrequent or of short duration when for other reasons).  Gearin, Haydan [name]. ([id number]) o If current  dressing causes regression in wound condition, may D/C ordered dressing product/s and apply Normal Saline Moist Dressing daily until next [place] / Other MD appointment. [place] of regression in wound condition at [phone]. o Please direct any NON-WOUND related issues/requests for orders to patient's Primary Care Physician Wound #3 Midline Back o [place] Visits - [place] Nurse may visit PRN to address patientos wound care needs. o FACE TO FACE ENCOUNTER: MEDICARE and MEDICAID PATIENTS: I certify that this patient is under my care and that I had a face-to-face encounter that meets the physician face-to-face encounter requirements with this patient on this date. The encounter with the patient was in whole or in part for the following MEDICAL CONDITION: (primary reason for [place]) MEDICAL NECESSITY: I certify, that based on my findings, NURSING services are a medically necessary home health service. HOME BOUND STATUS: I certify that my clinical findings support that this patient is homebound (i.e., Due to illness or injury, pt requires aid of supportive devices such as crutches, cane, wheelchairs, walkers, the use of special transportation or the assistance of another person to leave their place of residence. There is a normal inability to leave the home and doing so requires considerable and taxing effort. Other absences are for medical reasons / religious services and are infrequent or of short duration when for other reasons). o If current dressing causes regression in wound condition, may D/C ordered dressing product/s and apply Normal Saline Moist Dressing daily until next [place] / Other MD appointment. [place] of regression in wound condition at [phone]. o Please direct any NON-WOUND related issues/requests for orders to patient's Primary Care Physician Wound #7  Right Ischial [place] Visits - [place] Nurse may visit PRN to address patientos wound care needs. o FACE TO FACE ENCOUNTER: MEDICARE and MEDICAID PATIENTS: I certify that this patient is under my care and that I had a face-to-face encounter that meets the physician face-to-face encounter requirements with this patient on this date. The encounter with the patient was in whole or in part for the following MEDICAL CONDITION: (primary reason for [place]) MEDICAL NECESSITY: I certify, that based on my findings, NURSING services are a medically necessary home health service. HOME BOUND STATUS: I certify that my clinical findings support that this patient is homebound (i.e., Due to illness or injury, pt requires aid of supportive devices such as crutches, cane, wheelchairs, walkers, the use of special transportation or the assistance of another person to leave their place of residence. There is a normal inability to leave the home and doing so requires considerable and taxing effort. Other absences are for medical reasons / religious services and are infrequent or of short duration when for other reasons). o If current dressing causes regression in wound condition, may D/C ordered dressing product/s and apply Normal Saline Moist Dressing daily until next [place] / Other MD appointment. [place] of regression in wound condition at [phone]. o Please direct any NON-WOUND related issues/requests for orders to patient's Primary Care Physician Wound #8 Left [place] Visits - Alenah Laplume, Kelaiah [name]. ([id number]) o [place] Nurse may visit PRN to address patientos wound care needs. o FACE TO FACE ENCOUNTER: MEDICARE and MEDICAID PATIENTS: I certify that this patient is under my care and that I had a face-to-face  encounter that meets the physician face-to-face encounter  requirements with this patient on this date. The encounter with the patient was in whole or in part for the following MEDICAL CONDITION: (primary reason for [place]) MEDICAL NECESSITY: I certify, that based on my findings, NURSING services are a medically necessary home health service. HOME BOUND STATUS: I certify that my clinical findings support that this patient is homebound (i.e., Due to illness or injury, pt requires aid of supportive devices such as crutches, cane, wheelchairs, walkers, the use of special transportation or the assistance of another person to leave their place of residence. There is a normal inability to leave the home and doing so requires considerable and taxing effort. Other absences are for medical reasons / religious services and are infrequent or of short duration when for other reasons). o If current dressing causes regression in wound condition, may D/C ordered dressing product/s and apply Normal Saline Moist Dressing daily until next [place] / Other MD appointment. [place] of regression in wound condition at [phone]. o Please direct any NON-WOUND related issues/requests for orders to patient's Primary Care Physician Patient Medications Allergies: erythromycin, codeine, levaquin Notifications Medication Indication Start End Santyl 01/02/2016 DOSE topical 250 unit/gram ointment - ointment topical as directed Lotrisone 01/02/2016 DOSE topical 1 %-0.05 % cream - cream topical as directed Electronic Signature(s) Signed: 01/02/2016 4:55:52 PM By: Montey Hora Signed: 01/02/2016 4:56:07 PM By: Christin Fudge MD, FACS Previous Signature: 01/02/2016 4:34:08 PM Version By: Christin Fudge MD, FACS Entered By: Montey Hora on 01/02/2016 16:55:51 [name], [name] -------------------------------------------------------------------------------- Problem List Details Patient Name: [name], [name] Date of Service:  01/02/2016 3:00 PM Medical Record Number: [id number] Patient Account Number: [id number] Date of Birth/Sex: [birth year] (70 y.o. Female) Treating RN: Montey Hora Primary Care Physician: Paulita Cradle Other Clinician: Referring Physician: Paulita Cradle Treating Physician/Extender: Frann Rider in Treatment: 15 Active Problems ICD-10 Encounter Code Description Active Date Diagnosis E11.621 Type 2 diabetes mellitus with foot ulcer 03/13/2015 Yes L89.613 Pressure ulcer of right heel, stage 3 03/13/2015 Yes F17.218 Nicotine dependence, cigarettes, with other nicotine- 03/13/2015 Yes induced disorders L89.100 Pressure ulcer of unspecified part of back, unstageable 05/02/2015 Yes L89.213 Pressure ulcer of right hip, stage 3 08/05/2015 Yes I89.0 Lymphedema, not elsewhere classified 11/21/2015 Yes L97.211 Non-pressure chronic ulcer of right calf limited to 11/21/2015 Yes breakdown of skin Inactive Problems Resolved Problems ICD-10 Code Description Active Date Resolved Date L89.013 Pressure ulcer of right elbow, stage 3 03/13/2015 03/13/2015 [id number] Pressure ulcer of left hip, stage 2 05/30/2015 05/30/2015 [name], [name] L89.153 Pressure ulcer of sacral region, stage 3 05/30/2015 05/30/2015 Electronic Signature(s) Signed: 01/02/2016 4:34:36 PM By: Christin Fudge MD, FACS Entered By: Christin Fudge on 01/02/2016 16:34:36 [name], [name] -------------------------------------------------------------------------------- Progress Note Details Patient Name: Tenpas, Carle [name]. Date of Service: 01/02/2016 3:00 PM Medical Record Number: [id number] Patient Account Number: [id number] Date of Birth/Sex: 04/13/1946 (70 y.o. Female) Treating RN: Montey Hora Primary Care Physician: Paulita Cradle Other Clinician: Referring Physician: Paulita Cradle Treating Physician/Extender: Frann Rider in Treatment: 9 Subjective Chief Complaint Information obtained  from Patient Patient presents to the wound care center for a consult due non healing wound. Ulcers on the right elbow and the right heel for about 1 month. History of Present Illness (HPI) The following HPI elements were documented for the patient's wound: Location: Ulceration on the right heel and the right elbow. Quality: Patient reports experiencing a dull pain to affected area(s). Severity: Patient  states wound (s) are getting better. Duration: Patient has had the wound for < 4 weeks prior to presenting for treatment Timing: Pain in wound is Intermittent (comes and goes Context: The wound appeared gradually over time Modifying Factors: Consults to this date include:Augmentin and Bactrim and also some heel protection with duoderm Associated Signs and Symptoms: Patient reports having difficulty standing for long periods. 70 year old female with history of peripheral neuropathy, history of diet controlled diabetes mellitus type 2, history of alcoholism here for wound consult sent by her PCP Dr. Sherilyn Cooter. She has pressure ulcers at her right elbow, bilateral heels. Plain films of right calcaneus without acute bony process. Patient started by PCP on Augmentin, Bactrim as per orders, DuoDerm dressings applied - reports some improvement in her ulcer since last seen. Denies fever, chills, nausea, vomiting, diarrhea. She had a right humerus fracture in the middle of May and has had no surgery and arm is in a sling. She is also been laying in the bed for quite a while. Past medical history significant for essential hypertension, osteoporosis, peripheral neuropathy, alcoholism, ataxia, personal history of breast cancer treated with surgery chemotherapy and radiation and this was done in December 2010. she is also status post laparoscopic cholecystectomy, pilonidal cyst excision, subcutaneous port placement, partial mastectomy on the left side, skin cancer removal. 03/21/2015 -- she says overall she's  been doing better and continues to smoke about 15 cigarettes a day. 03/21/2015 - her orthopedic doctor has said she may require surgery for her right humerus fracture. 04/04/2015 -- her orthopedic surgery has been scheduled for August 11. 04/18/2015 -- she is doing fine as far as her elbow and her right heel goes but she has developed some redness over prominence on her thoracic spine and wanted me to take a look at this. [place], Charday [name]. ([id number]) 05/02/2015 -- she had her surgery done and now is in a sling and support. Her back has developed a pressure injury of undetermined stage. She seems to be in better spirits. 05/16/2015 -- last week her right heel was looking great and we had healed it out, but she has not been offloading appropriately and has a deep tissue injury on the right heel again. The area on her right elbow has opened out with slough and the area in the thoracic spine is also getting worse. 05/30/2015 -- she has developed 2 new ulcerations one on her left ischial tuberosity and one on the sacral region. She is still working on getting up smoking but is also unable to take her vitamins as she says she develops a diarrhea when she takes vitamins. She has increased her intake of proteins. 06/06/2015 -- the patient's husband manages to get her a low air loss mattress with initiating pressure but did not know how to use it exactly and the patient was not happy about using it. Overall she says she's been feeling better. 07/11/2015 -- . Discussed a surgical opinion for debridement and application of a wound VAC, 2 weeks ago but the patient has been reluctant to get a surgical opinion as she wants to avoid surgery. 07/18/2015 -- they have an appointment to see Dr. Tamala Julian this coming Wednesday. 07/29/2015 -- they saw Dr. Tamala Julian in his office and he did a debridement of the wound and removed significant amount of slough. This is in addition to the debridement I had done previously on  Friday where a large amount of the eschar was removed. He did not recommend the application of wound  VAC. Addendum : Dr. Thompson Caul note was reviewed via EPIC and details noted as above. 08/05/2015 -- over the last week she has developed a another pressure injury to her right hip and has had significant discoloration of the skin and an eschar there. 08/15/2015 -- she is still smoking about a pack of cigarettes a day and does not seem to want to quit. They have not been able to talk to the vendor regarding the air mattress and I will ask them to get in touch again. She is reluctant to take vitamins and does admit her nutrition is poor. 08/22/2015 -- she has been unable to tolerate her vitamins and continues to smoke. They're working on getting a low air loss mattress and have been speaking with the vendor. 09/19/2015 -- since her last visit and was admitted to the hospital between 09/09/2015 and 09/16/2015. She was thought to have an active sepsis possibly from one of her decubitus ulcers but nothing was grown except for an MSSA from her thoracic spine region. CT of this area did not show any osteomyelitis. Been given IV antibiotics which included vancomycin and Zosyn in the hospital under the care of Dr. [name] the ID specialist she was sent home on oral Keflex 500 mg 3 times a day for 2 weeks. he will consider an MRI in the outpatient setting to completely rule out osteomyelitis of the spine. 10/03/2015 --readmitted to hospital on 09/23/2015 for general debility, lethargy and possible sepsis and workup was in progress. Seen by Dr. [name] and he would also like a workup for underlying malignancy as she s had previous ultrasounds of the breasts suggesting findings of concern. Workup done so far does not suggest deep bony infection. She was treated for a pneumonia and received Zosyn and vancomycin. She had been recommended Cipro 500 twice a day and doxycycline 100 mg twice a day once she was  to be [place] home. The antibiotics were to be stopped on 10/07/2015. 10/17/2015 -- patient is feeling much better and has been eating better and doing her offloading as much as possible. 10/23/2015 -- she has an appointment to see Dr. [name] tomorrow but other than that has been doing as much as possible with offloading and increasing her diet. 11/07/2015 -- she has not been here to see [place] for 2 weeks and at the present time continues to try and eat better, work on off loading and is using the air mattress at night. She saw her PCP yesterday and she has put her back on a fentanyl patch. 11/21/2015 -- for some strange reason she has been sitting in a chair with her legs dependent for the last 6 days nonstop and has developed massive lower extremity edema and pedal edema with weeping and ulceration. [name], Akeiba [name]. ([id number]) Objective Constitutional Pulse regular. Respirations normal and unlabored. Afebrile. Vitals Time Taken: 3:28 PM, Height: 65 in, Weight: 100 lbs, BMI: 16.6, Pulse: 101 bpm, Respiratory Rate: 16 breaths/min, Blood Pressure: 102/71 mmHg. Eyes Nonicteric. Reactive to light. Ears, Nose, Mouth, and Throat Lips, teeth, and gums WNL.Marland Kitchen Moist mucosa without lesions. Neck supple and nontender. No palpable supraclavicular or cervical adenopathy. Normal sized without goiter. Respiratory WNL. No retractions.. Cardiovascular Pedal Pulses WNL. No clubbing, cyanosis or edema. Lymphatic No adneopathy. No adenopathy. No adenopathy. Musculoskeletal Adexa without tenderness or enlargement.. Digits and nails w/o clubbing, cyanosis, infection, petechiae, ischemia, or inflammatory conditions.Marland Kitchen Psychiatric Judgement and insight Intact.. No evidence of depression, anxiety, or agitation.. General Notes: she seemed to have a  fungal infection in the right forefoot and in the web spaces between the toes. The wound on the medial part has some hyper granulation tissue and silver nitrate  was used there. The wound on her back has some subcutaneous debris and we will use Santyl there. Right hip wound will need a packing strip and Santyl. Integumentary (Hair, Skin) No suspicious lesions. No crepitus or fluctuance. No peri-wound warmth or erythema. No masses.. Wound #10 status is Open. Original cause of wound was Gradually Appeared. The wound is located on the [place] ([id number]) Right,Medial Foot. The wound measures 0.5cm length x 0.5cm width x 0.1cm depth; 0.196cm^2 area and 0.02cm^3 volume. The wound is limited to skin breakdown. There is no tunneling or undermining noted. There is a medium amount of serous drainage noted. The wound margin is flat and intact. There is medium (34-66%) pink granulation within the wound bed. There is a medium (34-66%) amount of necrotic tissue within the wound bed including Adherent Slough. The periwound skin appearance exhibited: Moist, Erythema. The periwound skin appearance did not exhibit: Callus, Crepitus, Excoriation, Fluctuance, Friable, Induration, Localized Edema, Rash, Scarring, Dry/Scaly. The surrounding wound skin color is noted with erythema which is circumferential. Periwound temperature was noted as No Abnormality. The periwound has tenderness on palpation. Wound #11 status is Open. Original cause of wound was Pressure Injury. The wound is located on the Right,Distal Calcaneus. The wound measures 1.4cm length x 1.2cm width x 0.1cm depth; 1.319cm^2 area and 0.132cm^3 volume. The wound is limited to skin breakdown. There is no tunneling or undermining noted. There is a large amount of serous drainage noted. The wound margin is flat and intact. There is medium (34-66%) pink granulation within the wound bed. There is a medium (34-66%) amount of necrotic tissue within the wound bed including Adherent Slough. The periwound skin appearance exhibited: Maceration, Moist. The periwound skin appearance did not exhibit: Callus, Crepitus,  Excoriation, Fluctuance, Friable, Induration, Localized Edema, Rash, Scarring, Dry/Scaly, Atrophie Blanche, Cyanosis, Ecchymosis, Hemosiderin Staining, Mottled, Pallor, Rubor, Erythema. Periwound temperature was noted as No Abnormality. The periwound has tenderness on palpation. Wound #3 status is Open. Original cause of wound was Pressure Injury. The wound is located on the Midline Back. The wound measures 1.6cm length x 2.1cm width x 0.3cm depth; 2.639cm^2 area and 0.792cm^3 volume. The wound is limited to skin breakdown. There is no tunneling or undermining noted. There is a large amount of serosanguineous drainage noted. The wound margin is distinct with the outline attached to the wound base. There is medium (34-66%) red, pink granulation within the wound bed. There is a medium (34-66%) amount of necrotic tissue within the wound bed including Adherent Slough. The periwound skin appearance exhibited: Moist. The periwound skin appearance did not exhibit: Callus, Crepitus, Excoriation, Fluctuance, Friable, Induration, Localized Edema, Rash, Scarring, Dry/Scaly, Maceration, Atrophie Blanche, Cyanosis, Ecchymosis, Hemosiderin Staining, Mottled, Pallor, Rubor, Erythema. Periwound temperature was noted as No Abnormality. Wound #7 status is Open. Original cause of wound was Pressure Injury. The wound is located on the Right Ischial Tuberosity. The wound measures 0.9cm length x 0.7cm width x 0.5cm depth; 0.495cm^2 area and 0.247cm^3 volume. The wound is limited to skin breakdown. There is no tunneling or undermining noted. There is a large amount of serous drainage noted. The wound margin is distinct with the outline attached to the wound base. There is medium (34-66%) red, pink granulation within the wound bed. There is a medium (34-66%) amount of necrotic tissue within the wound bed including  Adherent Slough. The periwound skin appearance exhibited: Moist. The periwound skin appearance did not  exhibit: Callus, Crepitus, Excoriation, Fluctuance, Friable, Induration, Localized Edema, Rash, Scarring, Dry/Scaly, Maceration, Atrophie Blanche, Cyanosis, Ecchymosis, Hemosiderin Staining, Mottled, Pallor, Rubor, Erythema. Periwound temperature was noted as No Abnormality. The periwound has tenderness on palpation. Wound #8 status is Open. Original cause of wound was Gradually Appeared. The wound is located on the Left Malleolus. The wound measures 0.6cm length x 0.3cm width x 0.1cm depth; 0.141cm^2 area and 0.014cm^3 volume. The wound is limited to skin breakdown. There is no tunneling or undermining noted. There is a large amount of serous drainage noted. The wound margin is flat and intact. There is medium (34-66%) red, pink granulation within the wound bed. There is a medium (34-66%) amount of necrotic tissue within the wound bed including Adherent Slough. The periwound skin appearance exhibited: Moist, Erythema. The periwound skin appearance did not exhibit: Callus, Crepitus, Excoriation, Fluctuance, Friable, Induration, Localized Edema, Rash, Scarring, Dry/Scaly, Maceration, Atrophie Blanche, Cyanosis, [place], Hien [name]. ([id number]) Ecchymosis, Hemosiderin Staining, Mottled, Pallor, Rubor. The surrounding wound skin color is noted with erythema which is circumferential. Periwound temperature was noted as No Abnormality. The periwound has tenderness on palpation. 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 Wound #11 Right,Distal Calcaneus: Clean wound with Normal Saline. Wound #3 Midline Back: Clean wound with Normal Saline. Wound #7 Right Ischial Tuberosity: Clean wound with Normal Saline. Wound #8 Left Malleolus: Clean wound with Normal Saline. Anesthetic: Wound #10 Right,Medial Foot: Topical Lidocaine 4% cream applied to wound bed prior to debridement Wound #11 Right,Distal Calcaneus: Topical Lidocaine 4% cream applied to wound bed prior to debridement Wound #3 Midline Back: Topical Lidocaine 4% cream applied to wound bed prior to debridement Wound #7 Right Ischial Tuberosity: Topical Lidocaine 4% cream applied to wound bed prior to debridement Wound #8 Left Malleolus: Topical Lidocaine 4% cream applied to wound bed prior to debridement Primary Wound Dressing: [place], Amena [name]. ([id number]) Wound #10 Right,Medial Foot: Santyl Ointment - R ischial tuberosity also pack lightly with packing strip Wound #3 Midline Back: Santyl Ointment - R ischial tuberosity also pack lightly with packing strip Wound #7 Right Ischial Tuberosity: Santyl Ointment - R ischial tuberosity also pack lightly with packing strip Wound #11 Right,Distal Calcaneus: Other: - foam Wound #8 Left Malleolus: Prisma Ag Secondary Dressing: Wound #10 Right,Medial Foot: Boardered Foam Dressing Wound #11 Right,Distal Calcaneus: Boardered Foam Dressing Wound #3 Midline Back: Boardered Foam Dressing Wound #7 Right Ischial Tuberosity: Boardered Foam Dressing Wound #8 Left Malleolus: Boardered Foam Dressing Dressing Change Frequency: Wound #10 Right,Medial Foot: Change dressing every day. Wound #11 Right,Distal Calcaneus: Change dressing every day. Wound #3 Midline Back: Change dressing every day. Wound #7 Right Ischial Tuberosity: Change dressing every day. Wound #8 Left Malleolus: Change dressing every day. Follow-up Appointments: Wound #10 Right,Medial Foot: Return Appointment in 1 week. Wound #11  Right,Distal Calcaneus: Return Appointment in 1 week. Wound #3 Midline Back: Return Appointment in 1 week. Wound #7 Right Ischial Tuberosity: Return Appointment in 1 week. Wound #8 Left Malleolus: Return  Appointment in 1 week. Home Health: Wound #10 Right,Medial Foot: [place] Visits - [facility] Nurse may visit PRN to address patient s wound care needs. FACE TO FACE ENCOUNTER: MEDICARE and MEDICAID PATIENTS: I certify that this patient is under my care and that I had a face-to-face encounter that meets the physician face-to-face encounter requirements with this patient on this date. The encounter with the patient was in whole or in part for the following MEDICAL CONDITION: (primary reason for [place]) MEDICAL NECESSITY: I certify, [place], Lilinoe [name]. ([id number]) that based on my findings, NURSING services are a medically necessary home health service. HOME BOUND STATUS: I certify that my clinical findings support that this patient is homebound (i.e., Due to illness or injury, pt requires aid of supportive devices such as crutches, cane, wheelchairs, walkers, the use of special transportation or the assistance of another person to leave their place of residence. There is a normal inability to leave the home and doing so requires considerable and taxing effort. Other absences are for medical reasons / religious services and are infrequent or of short duration when for other reasons). If current dressing causes regression in wound condition, may D/C ordered dressing product/s and apply Normal Saline Moist Dressing daily until next [place] / Other MD appointment. [place] of regression in wound condition at [phone]. Please direct any NON-WOUND related issues/requests for orders to patient's Primary Care Physician Wound #11 Right,Distal Calcaneus: [place] Visits - [facility] Nurse may visit PRN to address  patient s wound care needs. FACE TO FACE ENCOUNTER: MEDICARE and MEDICAID PATIENTS: I certify that this patient is under my care and that I had a face-to-face encounter that meets the physician face-to-face encounter requirements with this patient on this date. The encounter with the patient was in whole or in part for the following MEDICAL CONDITION: (primary reason for [place]) MEDICAL NECESSITY: I certify, that based on my findings, NURSING services are a medically necessary home health service. HOME BOUND STATUS: I certify that my clinical findings support that this patient is homebound (i.e., Due to illness or injury, pt requires aid of supportive devices such as crutches, cane, wheelchairs, walkers, the use of special transportation or the assistance of another person to leave their place of residence. There is a normal inability to leave the home and doing so requires considerable and taxing effort. Other absences are for medical reasons / religious services and are infrequent or of short duration when for other reasons). If current dressing causes regression in wound condition, may D/C ordered dressing product/s and apply Normal Saline Moist Dressing daily until next [place] / Other MD appointment. [place] of regression in wound condition at [phone]. Please direct any NON-WOUND related issues/requests for orders to patient's Primary Care Physician Wound #3 Midline Back: [place] Visits - [facility] Nurse may visit PRN to address patient s wound care needs. FACE TO FACE ENCOUNTER: MEDICARE and MEDICAID PATIENTS: I certify that this patient is under my care and that I had a face-to-face encounter that meets the physician face-to-face encounter requirements with this patient on this date. The encounter with the patient was in whole or in part for the following MEDICAL CONDITION: (primary reason for [place]) MEDICAL  NECESSITY: I certify, that based on my findings, NURSING services are a medically necessary home health service. HOME BOUND STATUS: I certify that my clinical findings support that  this patient is homebound (i.e., Due to illness or injury, pt requires aid of supportive devices such as crutches, cane, wheelchairs, walkers, the use of special transportation or the assistance of another person to leave their place of residence. There is a normal inability to leave the home and doing so requires considerable and taxing effort. Other absences are for medical reasons / religious services and are infrequent or of short duration when for other reasons). If current dressing causes regression in wound condition, may D/C ordered dressing product/s and apply Normal Saline Moist Dressing daily until next [place] / Other MD appointment. [place] of regression in wound condition at [phone]. Please direct any NON-WOUND related issues/requests for orders to patient's Primary Care Physician Wound #7 Right Ischial Tuberosity: [place] Visits - [facility] Nurse may visit PRN to address patient s wound care needs. FACE TO FACE ENCOUNTER: MEDICARE and MEDICAID PATIENTS: I certify that this patient is under my care and that I had a face-to-face encounter that meets the physician face-to-face encounter requirements with this patient on this date. The encounter with the patient was in whole or in part for the following MEDICAL CONDITION: (primary reason for [place]) MEDICAL NECESSITY: I certify, [place], Gulianna [name]. ([id number]) that based on my findings, NURSING services are a medically necessary home health service. HOME BOUND STATUS: I certify that my clinical findings support that this patient is homebound (i.e., Due to illness or injury, pt requires aid of supportive devices such as crutches, cane, wheelchairs, walkers, the use of special transportation  or the assistance of another person to leave their place of residence. There is a normal inability to leave the home and doing so requires considerable and taxing effort. Other absences are for medical reasons / religious services and are infrequent or of short duration when for other reasons). If current dressing causes regression in wound condition, may D/C ordered dressing product/s and apply Normal Saline Moist Dressing daily until next [place] / Other MD appointment. [place] of regression in wound condition at [phone]. Please direct any NON-WOUND related issues/requests for orders to patient's Primary Care Physician Wound #8 Left Malleolus: [place] Visits - [facility] Nurse may visit PRN to address patient s wound care needs. FACE TO FACE ENCOUNTER: MEDICARE and MEDICAID PATIENTS: I certify that this patient is under my care and that I had a face-to-face encounter that meets the physician face-to-face encounter requirements with this patient on this date. The encounter with the patient was in whole or in part for the following MEDICAL CONDITION: (primary reason for [place]) MEDICAL NECESSITY: I certify, that based on my findings, NURSING services are a medically necessary home health service. HOME BOUND STATUS: I certify that my clinical findings support that this patient is homebound (i.e., Due to illness or injury, pt requires aid of supportive devices such as crutches, cane, wheelchairs, walkers, the use of special transportation or the assistance of another person to leave their place of residence. There is a normal inability to leave the home and doing so requires considerable and taxing effort. Other absences are for medical reasons / religious services and are infrequent or of short duration when for other reasons). If current dressing causes regression in wound condition, may D/C ordered dressing product/s and  apply Normal Saline Moist Dressing daily until next [place] / Other MD appointment. [place] of regression in wound condition at  [phone]. Please direct any NON-WOUND related issues/requests for orders to patient's Primary Care Physician The following medication(s) was prescribed: Santyl topical 250 unit/gram ointment ointment topical as directed starting 01/02/2016 Lotrisone topical 1 %-0.05 % cream cream topical as directed starting 01/02/2016 The wounds on her right lower extremity, thoracic part of her back and right hip need Santyl ointment and a bordered foam. Nutrition and vitamin supplements have been discussed. She will come back to see as next week Electronic Signature(s) Signed: 01/02/2016 5:01:33 PM By: Christin Fudge MD, FACS Previous Signature: 01/02/2016 4:36:51 PM Version By: Christin Fudge MD, FACS Entered By: Christin Fudge on 01/02/2016 17:01:33 Mestre, Tria [name]. ([id number]) Percifield, Alaynah [name]. ([id number]) -------------------------------------------------------------------------------- SuperBill Details Patient Name: Swatek, Makeyla [name]. Date of Service: 01/02/2016 Medical Record Number: [id number] Patient Account Number: [id number] Date of Birth/Sex: 05-06-1946 (70 y.o. Female) Treating RN: Montey Hora Primary Care Physician: Paulita Cradle Other Clinician: Referring Physician: Paulita Cradle Treating Physician/Extender: Frann Rider in Treatment: 42 Diagnosis Coding ICD-10 Codes Code Description 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, stage 3 F17.218 Nicotine dependence, cigarettes, with other nicotine-induced disorders L89.100 Pressure ulcer of unspecified part of back, unstageable L89.213 Pressure ulcer of right hip, stage 3 I89.0 Lymphedema, not elsewhere classified L97.211 Non-pressure chronic ulcer of right calf limited to breakdown of skin Facility Procedures CPT4 Code:  [id number] Description: 99214 - WOUND CARE VISIT-LEV 4 EST PT Modifier: Quantity: 1 Physician Procedures CPT4 Code: [id number] Description: [id number] - WC PHYS LEVEL 3 - EST PT ICD-10 Description Diagnosis 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, stage 3 L89.100 Pressure ulcer of unspecified part of back, unst L89.213 Pressure ulcer of  right hip, stage 3 Modifier: ageable Quantity: 1 Electronic Signature(s) Signed: 01/02/2016 4:57:01 PM By: Montey Hora Previous Signature: 01/02/2016 4:37:06 PM Version By: Christin Fudge MD, FACS Entered By: Montey Hora on 01/02/2016 16:57:01

## 2016-01-09 ENCOUNTER — Encounter: Payer: Medicare Other | Admitting: Surgery

## 2016-01-09 DIAGNOSIS — E11621 Type 2 diabetes mellitus with foot ulcer: Secondary | ICD-10-CM | POA: Diagnosis not present

## 2016-01-10 NOTE — Progress Notes (Signed)
HIBDON, Shabnam H. ([id number]) Visit Report for 01/09/2016 Arrival Information Details Patient Name: [name], [name] Date of Service: 01/09/2016 1:30 PM Medical Record Number: [id number] Patient Account Number: [id number] Date of Birth/Sex: 1946-07-09 (70 y.o. Female) Treating RN: Montey Hora Primary Care Physician: Paulita Cradle Other Clinician: Referring Physician: Paulita Cradle Treating Physician/Extender: Frann Rider in Treatment: 79 Visit Information History Since Last Visit Added or deleted any medications: No Patient Arrived: Wheel Chair Any new allergies or adverse reactions: No Arrival Time: 13:58 Had a fall or experienced change in No activities of daily living that may affect Accompanied By: spouse risk of falls: Transfer Assistance: None Signs or symptoms of abuse/neglect since last No Patient Identification Verified: Yes visito Secondary Verification Process Yes Hospitalized since last visit: No Completed: Pain Present Now: No Patient Requires Transmission-Based No Precautions: Patient Has Alerts: No Electronic Signature(s) Signed: 01/09/2016 4:42:56 PM By: Montey Hora Entered By: Montey Hora on 01/09/2016 14:00:10 [name], [name] ([id number]) -------------------------------------------------------------------------------- Encounter Discharge Information Details Patient Name: [name], [name] Date of Service: 01/09/2016 1:30 PM Medical Record Number: [id number] Patient Account Number: [id number] Date of Birth/Sex: [birth year] (70 y.o. Female) Treating RN: Montey Hora Primary Care Physician: Paulita Cradle Other Clinician: Referring Physician: Paulita Cradle Treating Physician/Extender: Frann Rider in Treatment: 39 Encounter Discharge Information Items Schedule Follow-up Appointment: No Medication Reconciliation completed No and provided to Patient/Care Erlean Mealor: Provided on Clinical Summary of Care: 01/09/2016 Form  Type Recipient Paper Patient AS Electronic Signature(s) Signed: 01/09/2016 2:51:32 PM By: Ruthine Dose Entered By: Ruthine Dose on 01/09/2016 14:51:31 [name], [name] ([id number]) -------------------------------------------------------------------------------- Multi Wound Chart Details Patient Name: [name], [name] Date of Service: 01/09/2016 1:30 PM Medical Record Number: [id number] Patient Account Number: [id number] Date of Birth/Sex: 04-21-1946 (70 y.o. 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] [11:Pressure Ulcer] [3:Pressure Ulcer] Comorbid History: [10:Cataracts, Asthma, Hypertension, Type II Diabetes, Neuropathy, Received Chemotherapy] [11:Cataracts, Asthma, Hypertension, Type II Diabetes, Neuropathy, Received Chemotherapy] [3:Cataracts, Asthma, Hypertension, Type II Diabetes,  Neuropathy, Received Chemotherapy] Date Acquired: [10:09/12/2015] [11:09/29/2015] [3:05/02/2015] Weeks of Treatment: [10:16] [11:14] [3:36] Wound Status: [10:Open] [11:Open] [3:Open] Measurements L x W x D 0.3x0.4x0.1 [11:1x1x0.1] [3:1.8x2.2x0.2] (cm) Area (cm) : [10:0.094] [11:0.785] [3:3.11] Volume (cm) : [10:0.009] [11:0.079] [3:0.622] % Reduction in Area: [10:60.20%] [11:-11.00%] [3:-1313.60%] % Reduction in Volume: 62.50% [11:-11.30%] [3:-2727.30%] Classification: [10:Category/Stage II] [11:Category/Stage  II] [3:Category/Stage II] HBO Classification: [10:Grade 1] [11:Grade 1] [3:N/A] Exudate Amount: [10:Medium] [11:Large] [3:Large] Exudate Type: [10:Serous] [11:Serous] [3:Serosanguineous] Exudate Color: [10:amber] [11:amber] [3:red, brown] Wound Margin: [10:Flat and Intact] [11:Flat and Intact] [3:Distinct, outline attached] Granulation Amount: [10:Medium (34-66%)] [11:Medium (34-66%)] [3:Medium (34-66%)] Granulation Quality: [10:Pink] [11:Pink, Hyper-granulation] [3:Red, Pink] Necrotic Amount: [10:Medium (34-66%)] [11:Medium (34-66%)] [3:Medium (34-66%)] Exposed Structures: [10:Fascia: No Fat: No Tendon: No Muscle: No] [11:Fascia: No Fat: No Tendon: No Muscle: No] [3:Fascia: No Fat: No Tendon: No Muscle: No] Joint: No Joint: No Joint: No Bone: No Bone: No Bone: No Limited to Skin Limited to Skin Limited to Skin Breakdown Breakdown Breakdown Epithelialization: None None None Periwound Skin Texture: Edema: No Edema: No Edema: No Excoriation: No Excoriation: No Excoriation: No Induration:  No Induration: No Induration: No Callus: No Callus: No Callus: No Crepitus: No Crepitus: No Crepitus: No Fluctuance: No Fluctuance: No Fluctuance: No Friable: No Friable: No Friable: No Rash: No Rash: No Rash: No Scarring: No Scarring: No Scarring: No Periwound Skin Moist: Yes Maceration: Yes Moist: Yes Moisture: Dry/Scaly: No Moist: Yes Maceration: No Dry/Scaly: No Dry/Scaly: No Periwound Skin Color: Erythema: Yes Atrophie Blanche: No Atrophie Blanche: No Cyanosis: No Cyanosis: No Ecchymosis: No Ecchymosis: No Erythema: No Erythema: No Hemosiderin Staining: No Hemosiderin Staining: No Mottled: No Mottled: No Pallor: No Pallor: No Rubor: No Rubor: No Erythema Location: Circumferential N/A N/A Temperature: No Abnormality No Abnormality No Abnormality Tenderness on Yes Yes No Palpation: Wound Preparation: Ulcer Cleansing: Ulcer Cleansing: Ulcer  Cleansing: Rinsed/Irrigated with Rinsed/Irrigated with Rinsed/Irrigated with Saline Saline Saline Topical Anesthetic Topical Anesthetic Topical Anesthetic Applied: Other: lidocaine Applied: Other: lidocaine Applied: Other: lidocaine 4% 4% 4% Wound Number: [id number] N/A Photos: No Photos No Photos N/A Wound Location: Right Ischial Tuberosity Left Malleolus N/A Wounding Event: Pressure Injury Gradually Appeared N/A Primary Etiology: Pressure Ulcer Pressure Ulcer N/A Comorbid History: Cataracts, Asthma, Cataracts, Asthma, N/A Hypertension, Type II Hypertension, Type II Diabetes, Neuropathy, Diabetes, Neuropathy, Received Chemotherapy Received Chemotherapy Date Acquired: 08/01/2015 09/12/2015 N/A Weeks of Treatment: 22 16 N/A Wound Status: Open Open N/A 0.7x0.6x0.5 0.5x0.4x0.1 N/A [name], [name] ([id number]) Measurements L x W x D (cm) Area (cm) : 0.33 0.157 N/A Volume (cm) : 0.165 0.016 N/A % Reduction in Area: 89.50% -234.00% N/A % Reduction in Volume: 47.50% -77.80% N/A Classification: Category/Stage III Category/Stage II N/A HBO Classification: N/A Grade 0 N/A Exudate Amount: Large Large N/A Exudate Type: Serous Serous N/A Exudate Color: amber amber N/A Wound Margin: Distinct, outline attached Flat and Intact N/A Granulation Amount: Medium (34-66%) Medium (34-66%) N/A Granulation Quality: Red, Pink Red, Pink N/A Necrotic Amount: Medium (34-66%) Medium (34-66%) N/A Exposed Structures: Fascia: No Fascia: No N/A Fat: No Fat: No Tendon: No Tendon: No Muscle: No Muscle: No Joint: No Joint: No Bone: No Bone: No Limited to Skin Limited to Skin Breakdown Breakdown Epithelialization: None None N/A Periwound Skin Texture: Edema: No Edema: No N/A Excoriation: No Excoriation: No Induration: No Induration: No Callus: No Callus: No Crepitus: No Crepitus: No Fluctuance: No Fluctuance: No Friable: No Friable: No Rash: No Rash: No Scarring: No Scarring: No Periwound  Skin Moist: Yes Moist: Yes N/A Moisture: Maceration: No Maceration: No Dry/Scaly: No Dry/Scaly: No Periwound Skin Color: Atrophie Blanche: No Erythema: Yes N/A Cyanosis: No Atrophie Blanche: No Ecchymosis: No Cyanosis: No Erythema: No Ecchymosis: No Hemosiderin Staining: No Hemosiderin Staining: No Mottled: No Mottled: No Pallor: No Pallor: No Rubor: No Rubor: No Erythema Location: N/A Circumferential N/A Temperature: No Abnormality No Abnormality N/A Tenderness on Yes Yes N/A Palpation: Wound Preparation: Ulcer Cleansing: Ulcer Cleansing: N/A Rinsed/Irrigated with Rinsed/Irrigated with [name], [name] ([id number]) Saline Saline Topical Anesthetic Topical Anesthetic Applied: Other: lidocaine Applied: Other: Lidocaine 4% 4% Treatment Notes Electronic Signature(s) Signed: 01/09/2016 2:23:26 PM By: Montey Hora Entered By: Montey Hora on 01/09/2016 14:23:26 [name], [name] ([id number]) -------------------------------------------------------------------------------- [place] Details Patient Name: [name], Hinley H. Date of Service: 01/09/2016 1:30 PM Medical Record Number: [id number] Patient Account Number: [id number] Date of Birth/Sex: December 03, 1945 (70 y.o. Female) Treating RN: Montey Hora Primary Care Physician: Paulita Cradle Other Clinician: Referring Physician: Paulita Cradle Treating Physician/Extender: Frann Rider in Treatment: 39 Active Inactive Abuse / Safety / Falls / Self Care Management Nursing Diagnoses: Impaired home maintenance Impaired physical mobility Knowledge  deficit related to: safety; personal, health (wound), emergency Potential for falls Self care deficit: actual or potential Goals: Patient will remain injury free Date Initiated: 03/13/2015 Goal Status: Active Patient/caregiver will verbalize understanding of skin care regimen Date Initiated: 03/13/2015 Goal Status: Active Patient/caregiver will  verbalize/demonstrate measure taken to improve self care Date Initiated: 03/13/2015 Goal Status: Active Patient/caregiver will verbalize/demonstrate measures taken to improve the patient's personal safety Date Initiated: 03/13/2015 Goal Status: Active Patient/caregiver will verbalize/demonstrate measures taken to prevent injury and/or falls Date Initiated: 03/13/2015 Goal Status: Active Patient/caregiver will verbalize/demonstrate understanding of what to do in case of emergency Date Initiated: 03/13/2015 Goal Status: Active Interventions: Assess fall risk on admission and as needed Assess: immobility, friction, shearing, incontinence upon admission and as needed Assess impairment of mobility on admission and as needed per policy Assess self care needs on admission and as needed Provide education on basic hygiene Cheeks, Emerie H. ([id number]) Provide education on fall prevention Provide education on personal and home safety Provide education on safe transfers Treatment Activities: Education provided on Basic Hygiene : 10/23/2015 Notes: Orientation to the Wound Care Program Nursing Diagnoses: Knowledge deficit related to the wound healing center program Goals: Patient/caregiver will verbalize understanding of the [place] Date Initiated: 03/13/2015 Goal Status: Active Interventions: Provide education on orientation to the wound center Notes: Pressure Nursing Diagnoses: Knowledge deficit related to causes and risk factors for pressure ulcer development Knowledge deficit related to management of pressures ulcers Potential for impaired tissue integrity related to pressure, friction, moisture, and shear Goals: Patient will remain free from development of additional pressure ulcers Date Initiated: 03/13/2015 Goal Status: Active Patient will remain free of pressure ulcers Date Initiated: 03/13/2015 Goal Status: Active Patient/caregiver will verbalize risk factors for  pressure ulcer development Date Initiated: 03/13/2015 Goal Status: Active Patient/caregiver will verbalize understanding of pressure ulcer management Date Initiated: 03/13/2015 Goal Status: Active Interventions: Assess: immobility, friction, shearing, incontinence upon admission and as needed Perot, Gladie H. ([id number]) Assess offloading mechanisms upon admission and as needed Assess potential for pressure ulcer upon admission and as needed Provide education on pressure ulcers Treatment Activities: Patient referred for home evaluation of offloading devices/mattresses : 01/02/2016 Patient referred for pressure reduction/relief devices : 01/02/2016 Patient referred for seating evaluation to ensure proper offloading : 01/02/2016 Pressure reduction/relief device ordered : 01/02/2016 Test ordered outside of clinic : 01/02/2016 Notes: Wound/Skin Impairment Nursing Diagnoses: Impaired tissue integrity Knowledge deficit related to ulceration/compromised skin integrity Goals: Patient/caregiver will verbalize understanding of skin care regimen Date Initiated: 03/13/2015 Goal Status: Active Ulcer/skin breakdown will have a volume reduction of 30% by week 4 Date Initiated: 03/13/2015 Goal Status: Active Ulcer/skin breakdown will have a volume reduction of 50% by week 8 Date Initiated: 03/13/2015 Goal Status: Active Ulcer/skin breakdown will have a volume reduction of 80% by week 12 Date Initiated: 03/13/2015 Goal Status: Active Ulcer/skin breakdown will heal within 14 weeks Date Initiated: 03/13/2015 Goal Status: Active Interventions: Assess patient/caregiver ability to obtain necessary supplies Assess patient/caregiver ability to perform ulcer/skin care regimen upon admission and as needed Assess ulceration(s) every visit Provide education on smoking Provide education on ulcer and skin care Treatment Activities: Patient referred to home care : 01/02/2016 PONCE, Leiah Lemmie Evens ([id number]) Referred  to DME [facility] for dressing supplies : 01/02/2016 Skin care regimen initiated : 01/02/2016 Topical wound management initiated : 01/02/2016 Notes: Electronic Signature(s) Signed: 01/09/2016 2:23:18 PM By: Montey Hora Entered By: Montey Hora on 01/09/2016 14:23:17 [name], [name] ([id number]) -------------------------------------------------------------------------------- Patient/Caregiver Education Details Patient  Name: [name], [name] Date of Service: 01/09/2016 1:30 PM Medical Record Number: [id number] Patient Account Number: [id number] Date of Birth/Gender: Jan 16, 1946 (70 y.o. Female) Treating RN: Montey Hora Primary Care Physician: Paulita Cradle Other Clinician: Referring Physician: Paulita Cradle Treating Physician/Extender: Frann Rider in Treatment: 71 Education Assessment Education Provided To: Patient and Caregiver Education Topics Provided Wound/Skin Impairment: Handouts: Other: wound care as ordered Methods: Demonstration, Explain/Verbal, Printed Responses: State content correctly Electronic Signature(s) Signed: 01/09/2016 2:23:50 PM By: Montey Hora Entered By: Montey Hora on 01/09/2016 14:23:50 [name], [name] ([id number]) -------------------------------------------------------------------------------- Wound Assessment Details Patient Name: [name], Phelan H. Date of Service: 01/09/2016 1:30 PM Medical Record Number: [id number] Patient Account Number: [id number] Date of Birth/Sex: 07/05/1946 (70 y.o. Female) Treating RN: Montey Hora Primary Care Physician: Paulita Cradle Other Clinician: Referring Physician: Paulita Cradle Treating Physician/Extender: Frann Rider in Treatment: 35 Wound Status Wound Number: 10 Primary Pressure Ulcer Etiology: Wound Location: Right Foot - Medial Wound Open Wounding Event: Gradually Appeared Status: Date Acquired: 09/12/2015 Comorbid Cataracts, Asthma, Hypertension, Type Weeks Of  Treatment: 16 History: II Diabetes, Neuropathy, Received Clustered Wound: No Chemotherapy Photos Photo Uploaded By: Montey Hora on 01/09/2016 15:03:02 Wound Measurements Length: (cm) 0.3 Width: (cm) 0.4 Depth: (cm) 0.1 Area: (cm) 0.094 Volume: (cm) 0.009 % Reduction in Area: 60.2% % Reduction in Volume: 62.5% Epithelialization: None Tunneling: No Undermining: No Wound Description Classification: Category/Stage II Foul Odor Af Diabetic Severity (Wagner): Grade 1 Wound Margin: Flat and Intact Exudate Amount: Medium Exudate Type: Serous Exudate Color: amber ter Cleansing: No Wound Bed Granulation Amount: Medium (34-66%) Exposed Structure Granulation Quality: Pink Fascia Exposed: No Necrotic Amount: Medium (34-66%) Fat Layer Exposed: No Ragsdale, Jaelee H. ([id number]) Necrotic Quality: Adherent Slough Tendon Exposed: No Muscle Exposed: No Joint Exposed: No Bone Exposed: No Limited to Skin Breakdown Periwound Skin Texture Texture Color No Abnormalities Noted: No No Abnormalities Noted: No Callus: No Erythema: Yes Crepitus: No Erythema Location: Circumferential Excoriation: No Temperature / Pain Fluctuance: No Temperature: No Abnormality Friable: No Tenderness on Palpation: Yes Induration: No Localized Edema: No Rash: No Scarring: No Moisture No Abnormalities Noted: No Dry / Scaly: No Moist: Yes Wound Preparation Ulcer Cleansing: Rinsed/Irrigated with Saline Topical Anesthetic Applied: Other: lidocaine 4%, Electronic Signature(s) Signed: 01/09/2016 4:42:56 PM By: Montey Hora Entered By: Montey Hora on 01/09/2016 14:17:39 Nickolas, Fredricka H. ([id number]) -------------------------------------------------------------------------------- Wound Assessment Details Patient Name: Alkins, Zarah H. Date of Service: 01/09/2016 1:30 PM Medical Record Number: [id number] Patient Account Number: [id number] Date of Birth/Sex: [birth year] (70 y.o. Female) Treating RN:  Montey Hora Primary Care Physician: Paulita Cradle Other Clinician: Referring Physician: Paulita Cradle Treating Physician/Extender: Frann Rider in Treatment: 63 Wound Status Wound Number: 11 Primary Pressure Ulcer Etiology: Wound Location: Right Calcaneus - Distal Wound Open Wounding Event: Pressure Injury Status: Date Acquired: 09/29/2015 Comorbid Cataracts, Asthma, Hypertension, Type Weeks Of Treatment: 14 History: II Diabetes, Neuropathy, Received Clustered Wound: No Chemotherapy Photos Photo Uploaded By: Montey Hora on 01/09/2016 15:03:02 Wound Measurements Length: (cm) 1 Width: (cm) 1 Depth: (cm) 0.1 Area: (cm) 0.785 Volume: (cm) 0.079 % Reduction in Area: -11% % Reduction in Volume: -11.3% Epithelialization: None Tunneling: No Undermining: No Wound Description Classification: Category/Stage II Foul Odor Af Diabetic Severity (Wagner): Grade 1 Wound Margin: Flat and Intact Exudate Amount: Large Exudate Type: Serous Exudate Color: amber ter Cleansing: No Wound Bed Granulation Amount: Medium (34-66%) Exposed Structure Granulation Quality: Pink, Hyper-granulation Fascia Exposed: No Necrotic Amount: Medium (34-66%) Fat Layer Exposed: No Hiott, Kenzington H. ([id number]) Necrotic Quality: Adherent  Slough Tendon Exposed: No Muscle Exposed: No Joint Exposed: No Bone Exposed: No Limited to Skin Breakdown Periwound Skin Texture Texture Color No Abnormalities Noted: No No Abnormalities Noted: No Callus: No Atrophie Blanche: No Crepitus: No Cyanosis: No Excoriation: No Ecchymosis: No Fluctuance: No Erythema: No Friable: No Hemosiderin Staining: No Induration: No Mottled: No Localized Edema: No Pallor: No Rash: No Rubor: No Scarring: No Temperature / Pain Moisture Temperature: No Abnormality No Abnormalities Noted: No Tenderness on Palpation: Yes Dry / Scaly: No Maceration: Yes Moist: Yes Wound Preparation Ulcer Cleansing:  Rinsed/Irrigated with Saline Topical Anesthetic Applied: Other: lidocaine 4%, Electronic Signature(s) Signed: 01/09/2016 4:42:56 PM By: Montey Hora Entered By: Montey Hora on 01/09/2016 14:17:55 [place], Emrys H. ([id number]) -------------------------------------------------------------------------------- Wound Assessment Details Patient Name: Capetillo, Shaneta H. Date of Service: 01/09/2016 1:30 PM Medical Record Number: [id number] Patient Account Number: [id number] Date of Birth/Sex: [birth year] (70 y.o. Female) Treating RN: Montey Hora Primary Care Physician: Paulita Cradle Other Clinician: Referring Physician: Paulita Cradle Treating Physician/Extender: Frann Rider in Treatment: 3 Wound Status Wound Number: 3 Primary Pressure Ulcer Etiology: Wound Location: [name] - Midline Wound Open Wounding Event: Pressure Injury Status: Date Acquired: 05/02/2015 Comorbid Cataracts, Asthma, Hypertension, Type Weeks Of Treatment: 36 History: II Diabetes, Neuropathy, Received Clustered Wound: No Chemotherapy Photos Photo Uploaded By: Montey Hora on 01/09/2016 15:02:35 Wound Measurements Length: (cm) 1.8 Width: (cm) 2.2 Depth: (cm) 0.2 Area: (cm) 3.11 Volume: (cm) 0.622 % Reduction in Area: -1313.6% % Reduction in Volume: -2727.3% Epithelialization: None Tunneling: No Undermining: No Wound Description Classification: Category/Stage II Wound Margin: Distinct, outline attached Exudate Amount: Large Exudate Type: Serosanguineous Exudate Color: red, brown Foul Odor After Cleansing: No Wound Bed Granulation Amount: Medium (34-66%) Exposed Structure Granulation Quality: Red, Pink Fascia Exposed: No Necrotic Amount: Medium (34-66%) Fat Layer Exposed: No Necrotic Quality: Adherent Slough Tendon Exposed: No Laraia, Cindia H. ([id number]) Muscle Exposed: No Joint Exposed: No Bone Exposed: No Limited to Skin Breakdown Periwound Skin Texture Texture Color No  Abnormalities Noted: No No Abnormalities Noted: No Callus: No Atrophie Blanche: No Crepitus: No Cyanosis: No Excoriation: No Ecchymosis: No Fluctuance: No Erythema: No Friable: No Hemosiderin Staining: No Induration: No Mottled: No Localized Edema: No Pallor: No Rash: No Rubor: No Scarring: No Temperature / Pain Moisture Temperature: No Abnormality No Abnormalities Noted: No Dry / Scaly: No Maceration: No Moist: Yes Wound Preparation Ulcer Cleansing: Rinsed/Irrigated with Saline Topical Anesthetic Applied: Other: lidocaine 4%, Electronic Signature(s) Signed: 01/09/2016 4:42:56 PM By: Montey Hora Entered By: Montey Hora on 01/09/2016 14:18:08 Escutia, Janus H. ([id number]) -------------------------------------------------------------------------------- Wound Assessment Details Patient Name: Weedon, Berma H. Date of Service: 01/09/2016 1:30 PM Medical Record Number: [id number] Patient Account Number: [id number] Date of Birth/Sex: 14-Jul-1946 (70 y.o. 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 Wound Open Wounding Event: Pressure Injury Status: Date Acquired: 08/01/2015 Comorbid Cataracts, Asthma, Hypertension, Type Weeks Of Treatment: 22 History: II Diabetes, Neuropathy, Received Clustered Wound: No Chemotherapy Photos Photo Uploaded By: Montey Hora on 01/09/2016 15:02:22 Wound Measurements Length: (cm) 0.7 Width: (cm) 0.6 Depth: (cm) 0.5 Area: (cm) 0.33 Volume: (cm) 0.165 % Reduction in Area: 89.5% % Reduction in Volume: 47.5% Epithelialization: None Tunneling: No Undermining: No Wound Description Classification: Category/Stage III Wound Margin: Distinct, outline attached Exudate  Amount: Large Exudate Type: Serous Exudate Color: amber Foul Odor After Cleansing: No Wound Bed  Granulation Amount: Medium (34-66%) Exposed Structure Granulation Quality: Red, Pink Fascia Exposed: No Necrotic Amount: Medium (34-66%) Fat Layer Exposed: No Necrotic Quality: Adherent Slough Tendon Exposed: No Tengan, Rayona H. ([id number]) Muscle Exposed: No Joint Exposed: No Bone Exposed: No Limited to Skin Breakdown Periwound Skin Texture Texture Color No Abnormalities Noted: No No Abnormalities Noted: No Callus: No Atrophie Blanche: No Crepitus: No Cyanosis: No Excoriation: No Ecchymosis: No Fluctuance: No Erythema: No Friable: No Hemosiderin Staining: No Induration: No Mottled: No Localized Edema: No Pallor: No Rash: No Rubor: No Scarring: No Temperature / Pain Moisture Temperature: No Abnormality No Abnormalities Noted: No Tenderness on Palpation: Yes Dry / Scaly: No Maceration: No Moist: Yes Wound Preparation Ulcer Cleansing: Rinsed/Irrigated with Saline Topical Anesthetic Applied: Other: lidocaine 4%, Electronic Signature(s) Signed: 01/09/2016 2:22:53 PM By: Montey Hora Entered By: Montey Hora on 01/09/2016 14:22:53 [place]worth, Varie H. ([id number]) -------------------------------------------------------------------------------- Wound Assessment Details Patient Name: Kramme, Darcie H. Date of Service: 01/09/2016 1:30 PM Medical Record Number: [id number] Patient Account Number: [id number] Date of Birth/Sex: October 17, 1945 (70 y.o. Female) Treating RN: Montey Hora Primary Care Physician: Paulita Cradle Other Clinician: Referring Physician: Paulita Cradle Treating Physician/Extender: Frann Rider in Treatment: 70 Wound Status Wound Number: 8 Primary Pressure Ulcer Etiology: Wound Location: Left Malleolus Wound Open Wounding Event: Gradually Appeared Status: Date Acquired: 09/12/2015 Comorbid Cataracts, Asthma, Hypertension, Type Weeks Of  Treatment: 16 History: II Diabetes, Neuropathy, Received Clustered Wound: No Chemotherapy Photos Photo Uploaded By: Montey Hora on 01/09/2016 15:02:22 Wound Measurements Length: (cm) 0.5 Width: (cm) 0.4 Depth: (cm) 0.1 Area: (cm) 0.157 Volume: (cm) 0.016 % Reduction in Area: -234% % Reduction in Volume: -77.8% Epithelialization: None Tunneling: No Undermining: No Wound Description Classification: Category/Stage II Foul Odor Af Diabetic Severity (Wagner): Grade 0 Wound Margin: Flat and Intact Exudate Amount: Large Exudate Type: Serous Exudate Color: amber ter Cleansing: No Wound Bed Granulation Amount: Medium (34-66%) Exposed Structure Granulation Quality: Red, Pink Fascia Exposed: No Necrotic Amount: Medium (34-66%) Fat Layer Exposed: No Querry, Coy H. ([id number]) Necrotic Quality: Adherent Slough Tendon Exposed: No Muscle Exposed: No Joint Exposed: No Bone Exposed: No Limited to Skin Breakdown Periwound Skin Texture Texture Color No Abnormalities Noted: No No Abnormalities Noted: No Callus: No Atrophie Blanche: No Crepitus: No Cyanosis: No Excoriation: No Ecchymosis: No Fluctuance: No Erythema: Yes Friable: No Erythema Location: Circumferential Induration: No Hemosiderin Staining: No Localized Edema: No Mottled: No Rash: No Pallor: No Scarring: No Rubor: No Moisture Temperature / Pain No Abnormalities Noted: No Temperature: No Abnormality Dry / Scaly: No Tenderness on Palpation: Yes Maceration: No Moist: Yes Wound Preparation Ulcer Cleansing: Rinsed/Irrigated with Saline Topical Anesthetic Applied: Other: Lidocaine 4%, Electronic Signature(s) Signed: 01/09/2016 2:23:07 PM By: Montey Hora Entered By: Montey Hora on 01/09/2016 14:23:07 Garrette, Kayti H. ([id number]) -------------------------------------------------------------------------------- Vitals Details Patient Name: [name], Annistyn H. Date of Service: 01/09/2016 1:30  PM Medical Record Number: [id number] Patient Account Number: [id number] Date of Birth/Sex: [birth year] (70 y.o. Female) Treating RN: Montey Hora Primary Care Physician: Paulita Cradle Other Clinician: Referring Physician: Paulita Cradle Treating Physician/Extender: Frann Rider in Treatment: 73 Vital Signs Time Taken: 14:00 Temperature (F): 98.3 Height (in): 65 Pulse (bpm): 102 Weight (lbs): 100 Respiratory Rate (breaths/min): 16 Body Mass Index (BMI): 16.6 Blood Pressure (mmHg): 98/67 Reference Range: 80 - 120 mg / dl Electronic Signature(s) Signed: 01/09/2016 4:42:56 PM By: Montey Hora Entered By: Montey Hora on 01/09/2016 14:01:24

## 2016-01-10 NOTE — Progress Notes (Signed)
HUGIE, [name] ([id number]) Visit Report for 01/09/2016 Chief Complaint Document Details Patient Name: [name], [name] Date of Service: 01/09/2016 1:30 PM Medical Record Number: [id number] Patient Account Number: [id number] Date of Birth/Sex: 08-Apr-1946 (70 y.o. Female) Treating RN: Montey Hora Primary Care Physician: Paulita Cradle Other Clinician: Referring Physician: Paulita Cradle Treating Physician/Extender: Frann Rider in Treatment: 81 Information Obtained from: Patient Chief Complaint Patient presents to the wound care center for a consult due non healing wound. Ulcers on the right elbow and the right heel for about 1 month. Electronic Signature(s) Signed: 01/09/2016 2:36:44 PM By: Christin Fudge MD, FACS Entered By: Christin Fudge on 01/09/2016 14:36:44 [name], [name] ([id number]) -------------------------------------------------------------------------------- HPI Details Patient Name: [name], [name] Date of Service: 01/09/2016 1:30 PM Medical Record Number: [id number] Patient Account Number: [id number] Date of Birth/Sex: Jul 09, 1946 (70 y.o. Female) Treating RN: Montey Hora Primary Care Physician: Paulita Cradle Other Clinician: Referring Physician: Paulita Cradle Treating Physician/Extender: Frann Rider in Treatment: 30 History of Present Illness Location: Ulceration on the right heel and the right elbow. Quality: Patient reports experiencing a dull pain to affected area(s). Severity: Patient states wound (s) are getting better. Duration: Patient has had the wound for < 4 weeks prior to presenting for treatment Timing: Pain in wound is Intermittent (comes and goes Context: The wound appeared gradually over time Modifying Factors: Consults to this date include:Augmentin and Bactrim and also some heel protection with duoderm Associated Signs and Symptoms: Patient reports having difficulty standing for long periods. HPI Description:  70 year old female with history of peripheral neuropathy, history of diet controlled diabetes mellitus type 2, history of alcoholism here for wound consult sent by her PCP Dr. Sherilyn Cooter. She has pressure ulcers at her right elbow, bilateral heels. Plain films of right calcaneus without acute bony process. Patient started by PCP on Augmentin, Bactrim as per orders, DuoDerm dressings applied - reports some improvement in her ulcer since last seen. Denies fever, chills, nausea, vomiting, diarrhea. She had a right humerus fracture in the middle of May and has had no surgery and arm is in a sling. She is also been laying in the bed for quite a while. Past medical history significant for essential hypertension, osteoporosis, peripheral neuropathy, alcoholism, ataxia, personal history of breast cancer treated with surgery chemotherapy and radiation and this was done in December 2010. she is also status post laparoscopic cholecystectomy, pilonidal cyst excision, subcutaneous port placement, partial mastectomy on the left side, skin cancer removal. 03/21/2015 -- she says overall she's been doing better and continues to smoke about 15 cigarettes a day. 03/21/2015 - her orthopedic doctor has said she may require surgery for her right humerus fracture. 04/04/2015 -- her orthopedic surgery has been scheduled for August 11. 04/18/2015 -- she is doing fine as far as her elbow and her right heel goes but she has developed some redness over prominence on her thoracic spine and wanted me to take a look at this. 05/02/2015 -- she had her surgery done and now is in a sling and support. Her back has developed a pressure injury of undetermined stage. She seems to be in better spirits. 05/16/2015 -- last week her right heel was looking great and we had healed it out, but she has not been offloading appropriately and has a deep tissue injury on the right heel again. The area on her right elbow has opened out with slough and  the area in the thoracic spine is also getting worse. 05/30/2015 -- she has developed  2 new ulcerations one on her left ischial tuberosity and one on the sacral region. She is still working on getting up smoking but is also unable to take her vitamins as she says she develops a diarrhea when she takes vitamins. She has increased her intake of proteins. 06/06/2015 -- the patient's husband manages to get her a low air loss mattress with initiating pressure but [name] did not know how to use it exactly and the patient was not happy about using it. Overall she says she's been feeling better. 07/11/2015 -- . Discussed a surgical opinion for debridement and application of a wound VAC, 2 weeks ago but the patient has been reluctant to get a surgical opinion as she wants to avoid surgery. 07/18/2015 -- they have an appointment to see Dr. Tamala Julian this coming Wednesday. 07/29/2015 -- they saw Dr. Tamala Julian in his office and he did a debridement of the wound and removed significant amount of slough. This is in addition to the debridement I had done previously on Friday where a large amount of the eschar was removed. He did not recommend the application of wound VAC. Addendum : Dr. Thompson Caul note was reviewed via EPIC and details noted as above. 08/05/2015 -- over the last week she has developed a another pressure injury to her right hip and has had significant discoloration of the skin and an eschar there. 08/15/2015 -- she is still smoking about a pack of cigarettes a day and does not seem to want to quit. They have not been able to talk to the vendor regarding the air mattress and I will ask them to get in touch again. She is reluctant to take vitamins and does admit her nutrition is poor. 08/22/2015 -- she has been unable to tolerate her vitamins and continues to smoke. They're working on getting a low air loss mattress and have been speaking with the vendor. 09/19/2015 -- since her last  visit and was admitted to the hospital between 09/09/2015 and 09/16/2015. She was thought to have an active sepsis possibly from one of her decubitus ulcers but nothing was grown except for an MSSA from her thoracic spine region. CT of this area did not show any osteomyelitis. Been given IV antibiotics which included vancomycin and Zosyn in the hospital under the care of Dr. [name] the ID specialist she was sent home on oral Keflex 500 mg 3 times a day for 2 weeks. he will consider an MRI in the outpatient setting to completely rule out osteomyelitis of the spine. 10/03/2015 --readmitted to hospital on 09/23/2015 for general debility, lethargy and possible sepsis and workup was in progress. Seen by Dr. [name] and he would also like a workup for underlying malignancy as sheos had previous ultrasounds of the breasts suggesting findings of concern. Workup done so far does not suggest deep bony infection. She was treated for a pneumonia and received Zosyn and vancomycin. She had been recommended Cipro 500 twice a day and doxycycline 100 mg twice a day once she was to be [place] home. The antibiotics were to be stopped on 10/07/2015. 10/17/2015 -- patient is feeling much better and has been eating better and doing her offloading as much as possible. 10/23/2015 -- she has an appointment to see Dr. [name] tomorrow but other than that has been doing as much as possible with offloading and increasing her diet. 11/07/2015 -- she has not been here to see [place] for 2 weeks and at the present time continues to  try and eat better, work on off loading and is using the air mattress at night. She saw her PCP yesterday and she has put her back on a fentanyl patch. 11/21/2015 -- for some strange reason she has been sitting in a chair with her legs dependent for the last 6 days nonstop and has developed massive lower extremity edema and pedal edema with weeping and ulceration. Electronic  Signature(s) Signed: 01/09/2016 2:36:55 PM By: Christin Fudge MD, FACS Entered By: Christin Fudge on 01/09/2016 14:36:55 [name], [name] -------------------------------------------------------------------------------- Physical Exam Details Patient Name: Reesor, Zaydah [name]. Date of Service: 01/09/2016 1:30 PM Medical Record Number: [id number] Patient Account Number: [id number] Date of Birth/Sex: [birth year] (70 y.o. Female) Treating RN: Montey Hora Primary Care Physician: Paulita Cradle Other Clinician: Referring Physician: Paulita Cradle Treating Physician/Extender: Frann Rider in Treatment: 43 Constitutional . Pulse regular. Respirations normal and unlabored. Afebrile. . Eyes Nonicteric. Reactive to light. Ears, Nose, Mouth, and Throat Lips, teeth, and gums WNL.Marland Kitchen Moist mucosa without lesions. Neck supple and nontender. No palpable supraclavicular or cervical adenopathy. Normal sized without goiter. Respiratory WNL. No retractions.. Cardiovascular Pedal Pulses WNL. No clubbing, cyanosis or edema. Lymphatic No adneopathy. No adenopathy. No adenopathy. Musculoskeletal Adexa without tenderness or enlargement.. Digits and nails w/o clubbing, cyanosis, infection, petechiae, ischemia, or inflammatory conditions.. Integumentary (Hair, Skin) No suspicious lesions. No crepitus or fluctuance. No peri-wound warmth or erythema. No masses.Marland Kitchen Psychiatric Judgement and insight Intact.. No evidence of depression, anxiety, or agitation.. Notes the fungal infection on the right forefoot is much better with the antifungal ointment. The wound on the right lateral ankle is clean and will benefit from [place]. The other wounds on the right medial calcaneum, right lateral forefoot right hip and back will require Santyl ointment Electronic Signature(s) Signed: 01/09/2016 2:38:00 PM By: Christin Fudge MD, FACS Entered By: Christin Fudge on 01/09/2016 14:37:59 [name], [name]  ([id number]) -------------------------------------------------------------------------------- Physician Orders Details Patient Name: [name], [name] Date of Service: 01/09/2016 1:30 PM Medical Record Number: [id number] Patient Account Number: [id number] Date of Birth/Sex: [birth year] (70 y.o. Female) Treating RN: Montey Hora Primary Care Physician: Paulita Cradle Other Clinician: Referring Physician: Paulita Cradle Treating Physician/Extender: Frann Rider in Treatment: 86 Verbal / Phone Orders: Yes Clinician: Montey Hora Read Back and Verified: Yes Diagnosis Coding Wound Cleansing Wound #10 Right,Medial Foot o Clean wound with Normal Saline. Wound #11 Right,Distal Calcaneus o Clean wound with Normal Saline. Wound #3 Midline Back o Clean wound with Normal Saline. Wound #7 Right Ischial Tuberosity o Clean wound with Normal Saline. Wound #8 Left Malleolus o Clean wound with Normal Saline. Anesthetic Wound #10 Right,Medial Foot o Topical Lidocaine 4% cream applied to wound bed prior to debridement Wound #11 Right,Distal Calcaneus o Topical Lidocaine 4% cream applied to wound bed prior to debridement Wound #3 Midline Back o Topical Lidocaine 4% cream applied to wound bed prior to debridement Wound #7 Right Ischial Tuberosity o Topical Lidocaine 4% cream applied to wound bed prior to debridement Wound #8 Left Malleolus o Topical Lidocaine 4% cream applied to wound bed prior to debridement Primary Wound Dressing Wound #10 Right,Medial Foot o Santyl Ointment - R ischial tuberosity also pack lightly with packing strip Wound #11 Right,Distal Calcaneus Halteman, Nimrat [name]. ([id number]) o Santyl Ointment - R ischial tuberosity also pack lightly with packing strip Wound #3 Midline Back o Santyl Ointment - R ischial tuberosity also pack lightly with packing strip Wound #7 Right Ischial Tuberosity o Santyl Ointment - R ischial tuberosity also  pack lightly with packing strip Wound #8 Left Malleolus o Prisma Ag Secondary Dressing Wound #10 Right,Medial Foot o Boardered Foam Dressing Wound #11 Right,Distal Calcaneus o Boardered Foam Dressing Wound #3 Midline Back o Boardered Foam Dressing Wound #7 Right Ischial Tuberosity o Boardered Foam Dressing Wound #8 Left Malleolus o Boardered Foam Dressing Dressing Change Frequency Wound #10 Right,Medial Foot o Change dressing every day. Wound #11 Right,Distal Calcaneus o Change dressing every day. Wound #3 Midline Back o Change dressing every day. Wound #7 Right Ischial Tuberosity o Change dressing every day. Wound #8 Left Malleolus o Change dressing every day. Follow-up Appointments Wound #10 Right,Medial Foot o Return Appointment in 1 week. Wound #11 Right,Distal Calcaneus [name], [name] ([id number]) o Return Appointment in 1 week. Wound #3 Midline Back o Return Appointment in 1 week. Wound #7 Right Ischial Tuberosity o Return Appointment in 1 week. Wound #8 Left Malleolus o Return Appointment in 1 week. Home Health Wound #10 [place] Visits - [place] Nurse may visit PRN to address patientos wound care needs. o FACE TO FACE ENCOUNTER: MEDICARE and MEDICAID PATIENTS: I certify that this patient is under my care and that I had a face-to-face encounter that meets the physician face-to-face encounter requirements with this patient on this date. The encounter with the patient was in whole or in part for the following MEDICAL CONDITION: (primary reason for [place]) MEDICAL NECESSITY: I certify, that based on my findings, NURSING services are a medically necessary home health service. HOME BOUND STATUS: I certify that my clinical findings support that this patient is homebound (i.e., Due to illness or injury, pt requires aid of supportive devices such as crutches, cane, wheelchairs,  walkers, the use of special transportation or the assistance of another person to leave their [name] of residence. There is a normal inability to leave the home and doing so requires considerable and taxing effort. Other absences are for medical reasons / religious services and are infrequent or of short duration when for other reasons). o If current dressing causes regression in wound condition, may D/C ordered dressing product/s and apply Normal Saline Moist Dressing daily until next [place] / Other MD appointment. [name] of regression in wound condition at [phone]. o Please direct any NON-WOUND related issues/requests for orders to patient's Primary Care Physician Wound #11 [place] Visits - [place] Nurse may visit PRN to address patientos wound care needs. o FACE TO FACE ENCOUNTER: MEDICARE and MEDICAID PATIENTS: I certify that this patient is under my care and that I had a face-to-face encounter that meets the physician face-to-face encounter requirements with this patient on this date. The encounter with the patient was in whole or in part for the following MEDICAL CONDITION: (primary reason for [place]) MEDICAL NECESSITY: I certify, that based on my findings, NURSING services are a medically necessary home health service. HOME BOUND STATUS: I certify that my clinical findings support that this patient is homebound (i.e., Due to illness or injury, pt requires aid of supportive devices such as crutches, cane, wheelchairs, walkers, the use of special transportation or the assistance of another person to leave their [name] of residence. There is a normal inability to leave the home and doing so requires considerable and taxing effort. Other absences are for medical reasons / religious services and are infrequent or of short duration when for other reasons). [name], [name]  ([id number])   o If current dressing causes regression in wound condition, may D/C ordered dressing product/s and apply Normal Saline Moist Dressing daily until next [place] / Other MD appointment. [place] of regression in wound condition at [phone]. o Please direct any NON-WOUND related issues/requests for orders to patient's Primary Care Physician Wound #3 Midline Back o [place] Visits - [place] Nurse may visit PRN to address patientos wound care needs. o FACE TO FACE ENCOUNTER: MEDICARE and MEDICAID PATIENTS: I certify that this patient is under my care and that I had a face-to-face encounter that meets the physician face-to-face encounter requirements with this patient on this date. The encounter with the patient was in whole or in part for the following MEDICAL CONDITION: (primary reason for [place]) MEDICAL NECESSITY: I certify, that based on my findings, NURSING services are a medically necessary home health service. HOME BOUND STATUS: I certify that my clinical findings support that this patient is homebound (i.e., Due to illness or injury, pt requires aid of supportive devices such as crutches, cane, wheelchairs, walkers, the use of special transportation or the assistance of another person to leave their [name] of residence. There is a normal inability to leave the home and doing so requires considerable and taxing effort. Other absences are for medical reasons / religious services and are infrequent or of short duration when for other reasons). o If current dressing causes regression in wound condition, may D/C ordered dressing product/s and apply Normal Saline Moist Dressing daily until next [place] / Other MD appointment. [place] of regression in wound condition at [phone]. o Please direct any NON-WOUND related issues/requests for orders to patient's Primary  Care Physician Wound #7 Right Ischial [place] Visits - [place] Nurse may visit PRN to address patientos wound care needs. o FACE TO FACE ENCOUNTER: MEDICARE and MEDICAID PATIENTS: I certify that this patient is under my care and that I had a face-to-face encounter that meets the physician face-to-face encounter requirements with this patient on this date. The encounter with the patient was in whole or in part for the following MEDICAL CONDITION: (primary reason for [place]) MEDICAL NECESSITY: I certify, that based on my findings, NURSING services are a medically necessary home health service. HOME BOUND STATUS: I certify that my clinical findings support that this patient is homebound (i.e., Due to illness or injury, pt requires aid of supportive devices such as crutches, cane, wheelchairs, walkers, the use of special transportation or the assistance of another person to leave their [name] of residence. There is a normal inability to leave the home and doing so requires considerable and taxing effort. Other absences are for medical reasons / religious services and are infrequent or of short duration when for other reasons). o If current dressing causes regression in wound condition, may D/C ordered dressing product/s and apply Normal Saline Moist Dressing daily until next [place] / Other MD appointment. [place] of regression in wound condition at [phone]. o Please direct any NON-WOUND related issues/requests for orders to patient's Primary Care Physician Wound #8 Left [place] Visits - [place] Matsushita, Mirelle [name]. ([id number]) o [place] Nurse may visit PRN to address patientos wound care needs. o FACE TO FACE ENCOUNTER: MEDICARE and MEDICAID PATIENTS: I certify that this patient is under my care and that I had a face-to-face encounter that meets the physician  face-to-face encounter requirements with this patient on this date. The encounter with the patient was in whole or in part for the following MEDICAL CONDITION: (primary reason for [place]) MEDICAL NECESSITY: I certify, that based on my findings, NURSING services are a medically necessary home health service. HOME BOUND STATUS: I certify that my clinical findings support that this patient is homebound (i.e., Due to illness or injury, pt requires aid of supportive devices such as crutches, cane, wheelchairs, walkers, the use of special transportation or the assistance of another person to leave their [name] of residence. There is a normal inability to leave the home and doing so requires considerable and taxing effort. Other absences are for medical reasons / religious services and are infrequent or of short duration when for other reasons). o If current dressing causes regression in wound condition, may D/C ordered dressing product/s and apply Normal Saline Moist Dressing daily until next [place] / Other MD appointment. [place] of regression in wound condition at [phone]. o Please direct any NON-WOUND related issues/requests for orders to patient's Primary Care Physician Electronic Signature(s) Signed: 01/09/2016 4:23:46 PM By: Christin Fudge MD, FACS Signed: 01/09/2016 4:42:56 PM By: Montey Hora Entered By: Montey Hora on 01/09/2016 14:30:57 [name], [name] -------------------------------------------------------------------------------- Problem List Details Patient Name: Sunde, Ellory [name]. Date of Service: 01/09/2016 1:30 PM Medical Record Number: [id number] Patient Account Number: [id number] Date of Birth/Sex: June 13, 1946 (70 y.o. Female) Treating RN: Montey Hora Primary Care Physician: Paulita Cradle Other Clinician: Referring Physician: Paulita Cradle Treating Physician/Extender: Frann Rider in Treatment:  86 Active Problems ICD-10 Encounter Code Description Active Date Diagnosis E11.621 Type 2 diabetes mellitus with foot ulcer 03/13/2015 Yes L89.613 Pressure ulcer of right heel, stage 3 03/13/2015 Yes F17.218 Nicotine dependence, cigarettes, with other nicotine- 03/13/2015 Yes induced disorders L89.100 Pressure ulcer of unspecified part of back, unstageable 05/02/2015 Yes L89.213 Pressure ulcer of right hip, stage 3 08/05/2015 Yes I89.0 Lymphedema, not elsewhere classified 11/21/2015 Yes L97.211 Non-pressure chronic ulcer of right calf limited to 11/21/2015 Yes breakdown of skin Inactive Problems Resolved Problems ICD-10 Code Description Active Date Resolved Date L89.013 Pressure ulcer of right elbow, stage 3 03/13/2015 03/13/2015 [id number] Pressure ulcer of left hip, stage 2 05/30/2015 05/30/2015 Handlin, Giavonna [name]. ([id number]) L89.153 Pressure ulcer of sacral region, stage 3 05/30/2015 05/30/2015 Electronic Signature(s) Signed: 01/09/2016 2:36:37 PM By: Christin Fudge MD, FACS Entered By: Christin Fudge on 01/09/2016 14:36:36 Hiney, Glanda [name]. ([id number]) -------------------------------------------------------------------------------- Progress Note Details Patient Name: [name], [name] Date of Service: 01/09/2016 1:30 PM Medical Record Number: [id number] Patient Account Number: [id number] Date of Birth/Sex: December 06, 1945 (70 y.o. Female) Treating RN: Montey Hora Primary Care Physician: Paulita Cradle Other Clinician: Referring Physician: Paulita Cradle Treating Physician/Extender: Frann Rider in Treatment: 8 Subjective Chief Complaint Information obtained from Patient Patient presents to the wound care center for a consult due non healing wound. Ulcers on the right elbow and the right heel for about 1 month. History of Present Illness (HPI) The following HPI elements were documented for the patient's wound: Location: Ulceration on the right heel and the right elbow. Quality:  Patient reports experiencing a dull pain to affected area(s). Severity: Patient states wound (s) are getting better. Duration: Patient has had the wound for < 4 weeks prior to presenting for treatment Timing: Pain in wound is Intermittent (comes and goes Context: The wound appeared gradually over time Modifying Factors: Consults to this date include:Augmentin and Bactrim and also some heel protection with duoderm  Associated Signs and Symptoms: Patient reports having difficulty standing for long periods. 70 year old female with history of peripheral neuropathy, history of diet controlled diabetes mellitus type 2, history of alcoholism here for wound consult sent by her PCP Dr. Sherilyn Cooter. She has pressure ulcers at her right elbow, bilateral heels. Plain films of right calcaneus without acute bony process. Patient started by PCP on Augmentin, Bactrim as per orders, DuoDerm dressings applied - reports some improvement in her ulcer since last seen. Denies fever, chills, nausea, vomiting, diarrhea. She had a right humerus fracture in the middle of May and has had no surgery and arm is in a sling. She is also been laying in the bed for quite a while. Past medical history significant for essential hypertension, osteoporosis, peripheral neuropathy, alcoholism, ataxia, personal history of breast cancer treated with surgery chemotherapy and radiation and this was done in December 2010. she is also status post laparoscopic cholecystectomy, pilonidal cyst excision, subcutaneous port placement, partial mastectomy on the left side, skin cancer removal. 03/21/2015 -- she says overall she's been doing better and continues to smoke about 15 cigarettes a day. 03/21/2015 - her orthopedic doctor has said she may require surgery for her right humerus fracture. 04/04/2015 -- her orthopedic surgery has been scheduled for August 11. 04/18/2015 -- she is doing fine as far as her elbow and her right heel goes but she has  developed some redness over prominence on her thoracic spine and wanted me to take a look at this. Casebier, Aisia [name]. ([id number]) 05/02/2015 -- she had her surgery done and now is in a sling and support. Her back has developed a pressure injury of undetermined stage. She seems to be in better spirits. 05/16/2015 -- last week her right heel was looking great and we had healed it out, but she has not been offloading appropriately and has a deep tissue injury on the right heel again. The area on her right elbow has opened out with slough and the area in the thoracic spine is also getting worse. 05/30/2015 -- she has developed 2 new ulcerations one on her left ischial tuberosity and one on the sacral region. She is still working on getting up smoking but is also unable to take her vitamins as she says she develops a diarrhea when she takes vitamins. She has increased her intake of proteins. 06/06/2015 -- the patient's husband manages to get her a low air loss mattress with initiating pressure but did not know how to use it exactly and the patient was not happy about using it. Overall she says she's been feeling better. 07/11/2015 -- . Discussed a surgical opinion for debridement and application of a wound VAC, 2 weeks ago but the patient has been reluctant to get a surgical opinion as she wants to avoid surgery. 07/18/2015 -- they have an appointment to see Dr. Tamala Julian this coming Wednesday. 07/29/2015 -- they saw Dr. Tamala Julian in his office and he did a debridement of the wound and removed significant amount of slough. This is in addition to the debridement I had done previously on Friday where a large amount of the eschar was removed. He did not recommend the application of wound VAC. Addendum : Dr. Thompson Caul note was reviewed via EPIC and details noted as above. 08/05/2015 -- over the last week she has developed a another pressure injury to her right hip and has had significant discoloration of the skin  and an eschar there. 08/15/2015 -- she is still smoking about a pack  of cigarettes a day and does not seem to want to quit. They have not been able to talk to the vendor regarding the air mattress and I will ask them to get in touch again. She is reluctant to take vitamins and does admit her nutrition is poor. 08/22/2015 -- she has been unable to tolerate her vitamins and continues to smoke. They're working on getting a low air loss mattress and have been speaking with the vendor. 09/19/2015 -- since her last visit and was admitted to the hospital between 09/09/2015 and 09/16/2015. She was thought to have an active sepsis possibly from one of her decubitus ulcers but nothing was grown except for an MSSA from her thoracic spine region. CT of this area did not show any osteomyelitis. Been given IV antibiotics which included vancomycin and Zosyn in the hospital under the care of Dr. [name] the ID specialist she was sent home on oral Keflex 500 mg 3 times a day for 2 weeks. he will consider an MRI in the outpatient setting to completely rule out osteomyelitis of the spine. 10/03/2015 --readmitted to hospital on 09/23/2015 for general debility, lethargy and possible sepsis and workup was in progress. Seen by Dr. [name] and he would also like a workup for underlying malignancy as she s had previous ultrasounds of the breasts suggesting findings of concern. Workup done so far does not suggest deep bony infection. She was treated for a pneumonia and received Zosyn and vancomycin. She had been recommended Cipro 500 twice a day and doxycycline 100 mg twice a day once she was to be [place] home. The antibiotics were to be stopped on 10/07/2015. 10/17/2015 -- patient is feeling much better and has been eating better and doing her offloading as much as possible. 10/23/2015 -- she has an appointment to see Dr. [name] tomorrow but other than that has been doing as much as possible with offloading and  increasing her diet. 11/07/2015 -- she has not been here to see [place] for 2 weeks and at the present time continues to try and eat better, work on off loading and is using the air mattress at night. She saw her PCP yesterday and she has put her back on a fentanyl patch. 11/21/2015 -- for some strange reason she has been sitting in a chair with her legs dependent for the last 6 days nonstop and has developed massive lower extremity edema and pedal edema with weeping and ulceration. Huckins, Ceana [name]. ([id number]) Objective Constitutional Pulse regular. Respirations normal and unlabored. Afebrile. Vitals Time Taken: 2:00 PM, Height: 65 in, Weight: 100 lbs, BMI: 16.6, Temperature: 98.3 F, Pulse: 102 bpm, Respiratory Rate: 16 breaths/min, Blood Pressure: 98/67 mmHg. Eyes Nonicteric. Reactive to light. Ears, Nose, Mouth, and Throat Lips, teeth, and gums WNL.Marland Kitchen Moist mucosa without lesions. Neck supple and nontender. No palpable supraclavicular or cervical adenopathy. Normal sized without goiter. Respiratory WNL. No retractions.. Cardiovascular Pedal Pulses WNL. No clubbing, cyanosis or edema. Lymphatic No adneopathy. No adenopathy. No adenopathy. Musculoskeletal Adexa without tenderness or enlargement.. Digits and nails w/o clubbing, cyanosis, infection, petechiae, ischemia, or inflammatory conditions.Marland Kitchen Psychiatric Judgement and insight Intact.. No evidence of depression, anxiety, or agitation.. General Notes: the fungal infection on the right forefoot is much better with the antifungal ointment. The wound on the right lateral ankle is clean and will benefit from Algood. The other wounds on the right medial calcaneum, right lateral forefoot right hip and back will require Santyl ointment Integumentary (Hair, Skin) No suspicious lesions. No  crepitus or fluctuance. No peri-wound warmth or erythema. No masses.. Wound #10 status is Open. Original cause of wound was Gradually Appeared. The  wound is located on the Right,Medial Foot. The wound measures 0.3cm length x 0.4cm width x 0.1cm depth; 0.094cm^2 area and [name], [name] ([id number]) 0.009cm^3 volume. The wound is limited to skin breakdown. There is no tunneling or undermining noted. There is a medium amount of serous drainage noted. The wound margin is flat and intact. There is medium (34-66%) pink granulation within the wound bed. There is a medium (34-66%) amount of necrotic tissue within the wound bed including Adherent Slough. The periwound skin appearance exhibited: Moist, Erythema. The periwound skin appearance did not exhibit: Callus, Crepitus, Excoriation, Fluctuance, Friable, Induration, Localized Edema, Rash, Scarring, Dry/Scaly. The surrounding wound skin color is noted with erythema which is circumferential. Periwound temperature was noted as No Abnormality. The periwound has tenderness on palpation. Wound #11 status is Open. Original cause of wound was Pressure Injury. The wound is located on the Right,Distal Calcaneus. The wound measures 1cm length x 1cm width x 0.1cm depth; 0.785cm^2 area and 0.079cm^3 volume. The wound is limited to skin breakdown. There is no tunneling or undermining noted. There is a large amount of serous drainage noted. The wound margin is flat and intact. There is medium (34-66%) pink granulation within the wound bed. There is a medium (34-66%) amount of necrotic tissue within the wound bed including Adherent Slough. The periwound skin appearance exhibited: Maceration, Moist. The periwound skin appearance did not exhibit: Callus, Crepitus, Excoriation, Fluctuance, Friable, Induration, Localized Edema, Rash, Scarring, Dry/Scaly, Atrophie Blanche, Cyanosis, Ecchymosis, Hemosiderin Staining, Mottled, Pallor, Rubor, Erythema. Periwound temperature was noted as No Abnormality. The periwound has tenderness on palpation. Wound #3 status is Open. Original cause of wound was Pressure Injury. The  wound is located on the Midline Back. The wound measures 1.8cm length x 2.2cm width x 0.2cm depth; 3.11cm^2 area and 0.622cm^3 volume. The wound is limited to skin breakdown. There is no tunneling or undermining noted. There is a large amount of serosanguineous drainage noted. The wound margin is distinct with the outline attached to the wound base. There is medium (34-66%) red, pink granulation within the wound bed. There is a medium (34-66%) amount of necrotic tissue within the wound bed including Adherent Slough. The periwound skin appearance exhibited: Moist. The periwound skin appearance did not exhibit: Callus, Crepitus, Excoriation, Fluctuance, Friable, Induration, Localized Edema, Rash, Scarring, Dry/Scaly, Maceration, Atrophie Blanche, Cyanosis, Ecchymosis, Hemosiderin Staining, Mottled, Pallor, Rubor, Erythema. Periwound temperature was noted as No Abnormality. Wound #7 status is Open. Original cause of wound was Pressure Injury. The wound is located on the Right Ischial Tuberosity. The wound measures 0.7cm length x 0.6cm width x 0.5cm depth; 0.33cm^2 area and 0.165cm^3 volume. The wound is limited to skin breakdown. There is no tunneling or undermining noted. There is a large amount of serous drainage noted. The wound margin is distinct with the outline attached to the wound base. There is medium (34-66%) red, pink granulation within the wound bed. There is a medium (34-66%) amount of necrotic tissue within the wound bed including Adherent Slough. The periwound skin appearance exhibited: Moist. The periwound skin appearance did not exhibit: Callus, Crepitus, Excoriation, Fluctuance, Friable, Induration, Localized Edema, Rash, Scarring, Dry/Scaly, Maceration, Atrophie Blanche, Cyanosis, Ecchymosis, Hemosiderin Staining, Mottled, Pallor, Rubor, Erythema. Periwound temperature was noted as No Abnormality. The periwound has tenderness on palpation. Wound #8 status is Open. Original cause  of wound was Gradually Appeared.  The wound is located on the Left Malleolus. The wound measures 0.5cm length x 0.4cm width x 0.1cm depth; 0.157cm^2 area and 0.016cm^3 volume. The wound is limited to skin breakdown. There is no tunneling or undermining noted. There is a large amount of serous drainage noted. The wound margin is flat and intact. There is medium (34-66%) red, pink granulation within the wound bed. There is a medium (34-66%) amount of necrotic tissue within the wound bed including Adherent Slough. The periwound skin appearance exhibited: Moist, Erythema. The periwound skin appearance did not exhibit: Callus, Crepitus, Excoriation, Fluctuance, Friable, Induration, Localized Edema, Rash, Scarring, Dry/Scaly, Maceration, Atrophie Blanche, Cyanosis, Ecchymosis, Hemosiderin Staining, Mottled, Pallor, Rubor. The surrounding wound skin color is noted with Delaney, Lilliann [name]. ([id number]) erythema which is circumferential. Periwound temperature was noted as No Abnormality. The periwound has tenderness on palpation. 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 Wound #11 Right,Distal Calcaneus: Clean wound with Normal Saline. Wound #3 Midline Back: Clean wound with Normal Saline. Wound #7 Right Ischial Tuberosity: Clean wound with Normal Saline. Wound #8 Left Malleolus: Clean wound with Normal Saline. Anesthetic: Wound #10 Right,Medial Foot: Topical Lidocaine 4% cream applied to wound bed prior to  debridement Wound #11 Right,Distal Calcaneus: Topical Lidocaine 4% cream applied to wound bed prior to debridement Wound #3 Midline Back: Topical Lidocaine 4% cream applied to wound bed prior to debridement Wound #7 Right Ischial Tuberosity: Topical Lidocaine 4% cream applied to wound bed prior to debridement Wound #8 Left Malleolus: Topical Lidocaine 4% cream applied to wound bed prior to debridement Primary Wound Dressing: Wound #10 Right,Medial Foot: Distler, [name] ([id number]) Santyl Ointment - R ischial tuberosity also pack lightly with packing strip Wound #11 Right,Distal Calcaneus: Santyl Ointment - R ischial tuberosity also pack lightly with packing strip Wound #3 Midline Back: Santyl Ointment - R ischial tuberosity also pack lightly with packing strip Wound #7 Right Ischial Tuberosity: Santyl Ointment - R ischial tuberosity also pack lightly with packing strip Wound #8 Left Malleolus: Prisma Ag Secondary Dressing: Wound #10 Right,Medial Foot: Boardered Foam Dressing Wound #11 Right,Distal Calcaneus: Boardered Foam Dressing Wound #3 Midline Back: Boardered Foam Dressing Wound #7 Right Ischial Tuberosity: Boardered Foam Dressing Wound #8 Left Malleolus: Boardered Foam Dressing Dressing Change Frequency: Wound #10 Right,Medial Foot: Change dressing every day. Wound #11 Right,Distal Calcaneus: Change dressing every day. Wound #3 Midline Back: Change dressing every day. Wound #7 Right Ischial Tuberosity: Change dressing every day. Wound #8 Left Malleolus: Change dressing every day. Follow-up Appointments: Wound #10 Right,Medial Foot: Return Appointment in 1 week. Wound #11 Right,Distal Calcaneus: Return Appointment in 1 week. Wound #3 Midline Back: Return Appointment in 1 week. Wound #7 Right Ischial Tuberosity: Return Appointment in 1 week. Wound #8 Left Malleolus: Return Appointment in 1 week. Home Health: Wound #10 Right,Medial Foot: [place]  Visits - [facility] Nurse may visit PRN to address patient s wound care needs. FACE TO FACE ENCOUNTER: MEDICARE and MEDICAID PATIENTS: I certify that this patient is under my care and that I had a face-to-face encounter that  meets the physician face-to-face encounter requirements with this patient on this date. The encounter with the patient was in whole or in part for the following MEDICAL CONDITION: (primary reason for [place]) MEDICAL NECESSITY: I certify, that based on my findings, NURSING services are a medically necessary home health service. HOME Delgrande, Ceylin [name]. ([id number]) BOUND STATUS: I certify that my clinical findings support that this patient is homebound (i.e., Due to illness or injury, pt requires aid of supportive devices such as crutches, cane, wheelchairs, walkers, the use of special transportation or the assistance of another person to leave their [name] of residence. There is a normal inability to leave the home and doing so requires considerable and taxing effort. Other absences are for medical reasons / religious services and are infrequent or of short duration when for other reasons). If current dressing causes regression in wound condition, may D/C ordered dressing product/s and apply Normal Saline Moist Dressing daily until next [place] / Other MD appointment. [place] of regression in wound condition at [phone]. Please direct any NON-WOUND related issues/requests for orders to patient's Primary Care Physician Wound #11 Right,Distal Calcaneus: [place] Visits - [facility] Nurse may visit PRN to address patient s wound care needs. FACE TO FACE ENCOUNTER: MEDICARE and MEDICAID PATIENTS: I certify that this patient is under my care and that I had a face-to-face encounter that meets the physician face-to-face encounter requirements with this patient on this date. The encounter with the patient was in  whole or in part for the following MEDICAL CONDITION: (primary reason for [place]) MEDICAL NECESSITY: I certify, that based on my findings, NURSING services are a medically necessary home health service. HOME BOUND STATUS: I certify that my clinical findings support that this patient is homebound (i.e., Due to illness or injury, pt requires aid of supportive devices such as crutches, cane, wheelchairs, walkers, the use of special transportation or the assistance of another person to leave their [name] of residence. There is a normal inability to leave the home and doing so requires considerable and taxing effort. Other absences are for medical reasons / religious services and are infrequent or of short duration when for other reasons). If current dressing causes regression in wound condition, may D/C ordered dressing product/s and apply Normal Saline Moist Dressing daily until next [place] / Other MD appointment. [place] of regression in wound condition at [phone]. Please direct any NON-WOUND related issues/requests for orders to patient's Primary Care Physician Wound #3 Midline Back: [place] Visits - [facility] Nurse may visit PRN to address patient s wound care needs. FACE TO FACE ENCOUNTER: MEDICARE and MEDICAID PATIENTS: I certify that this patient is under my care and that I had a face-to-face encounter that meets the physician face-to-face encounter requirements with this patient on this date. The encounter with the patient was in whole or in part for the following MEDICAL CONDITION: (primary reason for [place]) MEDICAL NECESSITY: I certify, that based on my findings, NURSING services are a medically necessary home health service. HOME BOUND STATUS: I certify that my clinical findings support that this patient is homebound (i.e., Due to illness or injury, pt requires aid of supportive devices such as crutches, cane,  wheelchairs, walkers, the use of special transportation or the assistance of another person to leave their [name] of residence. There is a normal inability to leave the home and doing so requires considerable and  taxing effort. Other absences are for medical reasons / religious services and are infrequent or of short duration when for other reasons). If current dressing causes regression in wound condition, may D/C ordered dressing product/s and apply Normal Saline Moist Dressing daily until next [place] / Other MD appointment. [place] of regression in wound condition at [phone]. Please direct any NON-WOUND related issues/requests for orders to patient's Primary Care Physician Wound #7 Right Ischial Tuberosity: [place] Visits - [facility] Nurse may visit PRN to address patient s wound care needs. FACE TO FACE ENCOUNTER: MEDICARE and MEDICAID PATIENTS: I certify that this patient is under my care and that I had a face-to-face encounter that meets the physician face-to-face encounter requirements with this patient on this date. The encounter with the patient was in whole or in part for the following MEDICAL CONDITION: (primary reason for [place]) MEDICAL NECESSITY: I certify, that based on my findings, NURSING services are a medically necessary home health service. HOME Greth, Jahniyah [name]. ([id number]) BOUND STATUS: I certify that my clinical findings support that this patient is homebound (i.e., Due to illness or injury, pt requires aid of supportive devices such as crutches, cane, wheelchairs, walkers, the use of special transportation or the assistance of another person to leave their [name] of residence. There is a normal inability to leave the home and doing so requires considerable and taxing effort. Other absences are for medical reasons / religious services and are infrequent or of short duration when for other reasons). If  current dressing causes regression in wound condition, may D/C ordered dressing product/s and apply Normal Saline Moist Dressing daily until next [place] / Other MD appointment. [place] of regression in wound condition at [phone]. Please direct any NON-WOUND related issues/requests for orders to patient's Primary Care Physician Wound #8 Left Malleolus: [place] Visits - [facility] Nurse may visit PRN to address patient s wound care needs. FACE TO FACE ENCOUNTER: MEDICARE and MEDICAID PATIENTS: I certify that this patient is under my care and that I had a face-to-face encounter that meets the physician face-to-face encounter requirements with this patient on this date. The encounter with the patient was in whole or in part for the following MEDICAL CONDITION: (primary reason for [place]) MEDICAL NECESSITY: I certify, that based on my findings, NURSING services are a medically necessary home health service. HOME BOUND STATUS: I certify that my clinical findings support that this patient is homebound (i.e., Due to illness or injury, pt requires aid of supportive devices such as crutches, cane, wheelchairs, walkers, the use of special transportation or the assistance of another person to leave their [name] of residence. There is a normal inability to leave the home and doing so requires considerable and taxing effort. Other absences are for medical reasons / religious services and are infrequent or of short duration when for other reasons). If current dressing causes regression in wound condition, may D/C ordered dressing product/s and apply Normal Saline Moist Dressing daily until next [place] / Other MD appointment. [place] of regression in wound condition at [phone]. Please direct any NON-WOUND related issues/requests for orders to patient's Primary Care Physician I have recommended offloading  and walking as much as possible and she says she will try this with a walker. We will use Prisma AG on the left lateral ankle and all other wounds will benefit from Santyl ointment and  bordered foam. Nutrition and vitamin supplements have also been discussed with her. Electronic Signature(s) Signed: 01/09/2016 2:38:41 PM By: Christin Fudge MD, FACS Entered By: Christin Fudge on 01/09/2016 14:38:40 Childrey, Shardea [name]. ([id number]) -------------------------------------------------------------------------------- SuperBill Details Patient Name: [name], Adryan [name]. Date of Service: 01/09/2016 Medical Record Number: [id number] Patient Account Number: [id number] Date of Birth/Sex: May 25, 1946 (70 y.o. Female) Treating RN: Montey Hora Primary Care Physician: Paulita Cradle Other Clinician: Referring Physician: Paulita Cradle Treating Physician/Extender: Frann Rider in Treatment: 5 Diagnosis Coding ICD-10 Codes Code Description 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, stage 3 F17.218 Nicotine dependence, cigarettes, with other nicotine-induced disorders L89.100 Pressure ulcer of unspecified part of back, unstageable L89.213 Pressure ulcer of right hip, stage 3 I89.0 Lymphedema, not elsewhere classified L97.211 Non-pressure chronic ulcer of right calf limited to breakdown of skin Physician Procedures CPT4 Code: [id number] Description: 99213 - WC PHYS LEVEL 3 - EST PT ICD-10 Description Diagnosis 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, stage 3 L89.100 Pressure ulcer of unspecified part of back, unst L89.213 Pressure ulcer of  right hip, stage 3 Modifier: ageable Quantity: 1 Electronic Signature(s) Signed: 01/09/2016 2:39:28 PM By: Christin Fudge MD, FACS Entered By: Christin Fudge on 01/09/2016 14:39:28

## 2016-01-16 ENCOUNTER — Encounter: Payer: Medicare Other | Attending: Surgery | Admitting: Surgery

## 2016-01-16 DIAGNOSIS — L89100 Pressure ulcer of unspecified part of back, unstageable: Secondary | ICD-10-CM | POA: Diagnosis not present

## 2016-01-16 DIAGNOSIS — L89213 Pressure ulcer of right hip, stage 3: Secondary | ICD-10-CM | POA: Diagnosis not present

## 2016-01-16 DIAGNOSIS — Z853 Personal history of malignant neoplasm of breast: Secondary | ICD-10-CM | POA: Diagnosis not present

## 2016-01-16 DIAGNOSIS — M810 Age-related osteoporosis without current pathological fracture: Secondary | ICD-10-CM | POA: Insufficient documentation

## 2016-01-16 DIAGNOSIS — E11621 Type 2 diabetes mellitus with foot ulcer: Secondary | ICD-10-CM | POA: Diagnosis present

## 2016-01-16 DIAGNOSIS — F1021 Alcohol dependence, in remission: Secondary | ICD-10-CM | POA: Diagnosis not present

## 2016-01-16 DIAGNOSIS — L89613 Pressure ulcer of right heel, stage 3: Secondary | ICD-10-CM | POA: Insufficient documentation

## 2016-01-16 DIAGNOSIS — I890 Lymphedema, not elsewhere classified: Secondary | ICD-10-CM | POA: Insufficient documentation

## 2016-01-16 DIAGNOSIS — I10 Essential (primary) hypertension: Secondary | ICD-10-CM | POA: Insufficient documentation

## 2016-01-16 DIAGNOSIS — E1140 Type 2 diabetes mellitus with diabetic neuropathy, unspecified: Secondary | ICD-10-CM | POA: Insufficient documentation

## 2016-01-16 DIAGNOSIS — L97211 Non-pressure chronic ulcer of right calf limited to breakdown of skin: Secondary | ICD-10-CM | POA: Insufficient documentation

## 2016-01-16 DIAGNOSIS — F17218 Nicotine dependence, cigarettes, with other nicotine-induced disorders: Secondary | ICD-10-CM | POA: Diagnosis not present

## 2016-01-17 NOTE — Progress Notes (Signed)
NEVITT, Max H. ([id number]) Visit Report for 01/16/2016 Arrival Information Details Patient Name: [name], [name] Date of Service: 01/16/2016 3:45 PM Medical Record Number: [id number] Patient Account Number: [id number] Date of Birth/Sex: 1946/01/13 (70 y.o. Female) Treating RN: Carolyne Fiscal, Debi Primary Care Physician: Paulita Cradle Other Clinician: Referring Physician: Paulita Cradle Treating Physician/Extender: Frann Rider in Treatment: 32 Visit Information History Since Last Visit All ordered tests and consults were completed: No Patient Arrived: Walker Added or deleted any medications: No Arrival Time: 15:50 Any new allergies or adverse reactions: No Accompanied By: husband Had a fall or experienced change in No Transfer Assistance: Easy[employer] activities of daily living that may affect Patient Lift risk of falls: Patient Identification Verified: Yes Signs or symptoms of abuse/neglect since last No Secondary Verification Process Yes visito Completed: Hospitalized since last visit: No Patient Requires Transmission- No Pain Present Now: No Based Precautions: Patient Has Alerts: No Electronic Signature(s) Signed: 01/16/2016 5:08:36 PM By: Alric Quan Entered By: Alric Quan on 01/16/2016 15:51:00 [name], [name] ([id number]) -------------------------------------------------------------------------------- Encounter Discharge Information Details Patient Name: [name], [name] Date of Service: 01/16/2016 3:45 PM Medical Record Number: [id number] Patient Account Number: [id number] Date of Birth/Sex: 11-05-1945 (70 y.o. Female) Treating RN: Carolyne Fiscal, Debi Primary Care Physician: Paulita Cradle Other Clinician: Referring Physician: Paulita Cradle Treating Physician/Extender: Frann Rider in Treatment: 33 Encounter Discharge Information Items Discharge Pain Level: 0 Discharge Condition: Stable Ambulatory Status: Walker Discharge Destination:  Home Transportation: Private Auto Accompanied By: husband Schedule Follow-up Appointment: Yes Medication Reconciliation completed Yes and provided to Patient/Care Daphna Lafuente: Provided on Clinical Summary of Care: 01/16/2016 Form Type Recipient Paper Patient AS Electronic Signature(s) Signed: 01/16/2016 5:08:36 PM By: Alric Quan Previous Signature: 01/16/2016 4:32:34 PM Version By: Ruthine Dose Entered By: Alric Quan on 01/16/2016 17:05:40 [name], [name] ([id number]) -------------------------------------------------------------------------------- Lower Extremity Assessment Details Patient Name: [name], [name] Date of Service: 01/16/2016 3:45 PM Medical Record Number: [id number] Patient Account Number: [id number] Date of Birth/Sex: [birth year] (70 y.o. Female) Treating RN: Ahmed Prima Primary Care Physician: Paulita Cradle Other Clinician: Referring Physician: Paulita Cradle Treating Physician/Extender: Frann Rider in Treatment: 49 Vascular Assessment Pulses: Posterior Tibial Dorsalis Pedis Palpable: [Left:Yes] [Right:Yes] Electronic Signature(s) Signed: 01/16/2016 5:08:36 PM By: Alric Quan Entered By: Alric Quan on 01/16/2016 15:53:24 [name], [name] ([id number]) -------------------------------------------------------------------------------- Multi Wound Chart Details Patient Name: Route, [name] Date of Service: 01/16/2016 3:45 PM Medical Record Number: [id number] Patient Account Number: [id number] Date of Birth/Sex: 02-17-1946 (70 y.o. 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] [11:Pressure Ulcer] [3:Pressure Ulcer] Comorbid History: [10:N/A] [11:N/A] [3:Cataracts, Asthma, Hypertension, Type II Diabetes, Neuropathy, Received Chemotherapy] Date Acquired: [10:09/12/2015] [11:09/29/2015] [3:05/02/2015] Weeks of Treatment: [10:17] [11:15] [3:37] Wound Status: [10:Open] [11:Open] [3:Open] Measurements L x W x D 0.3x0.3x0.1 [11:0.7x0.8x0.1] [3:2x2.5x0.2] (cm) Area (cm) : [10:0.071] [11:0.44] [3:3.927] Volume (cm) : [10:0.007] [11:0.044] [3:0.785] % Reduction in Area: [10:69.90%] [11:37.80%] [3:-1685.00%] % Reduction in Volume: 70.80% [11:38.00%] [3:-3468.20%] Starting Position 1 [3:9] (o'clock): Ending Position 1 [3:12] (o'clock): Maximum Distance 1 [3:1.1] (cm): Undermining: [10:N/A] [11:N/A] [3:Yes] Classification: [10:Category/Stage II] [11:Category/Stage II] [3:Category/Stage II] HBO Classification: [10:N/A] [11:N/A] [3:N/A]  Exudate Amount: [10:N/A] [11:N/A] [3:Large] Exudate Type: [10:N/A] [11:N/A] [3:Serosanguineous] Exudate Color: [10:N/A] [11:N/A] [3:red, brown] Wound Margin: [10:N/A] [11:N/A] [3:Distinct, outline attached] Granulation Amount: N/A N/A Medium (34-66%) Granulation Quality: N/A N/A Red, Pink Necrotic Amount: N/A N/A Medium (34-66%) Epithelialization: N/A N/A None Periwound Skin Texture: No Abnormalities Noted No Abnormalities Noted Edema: No Excoriation: No Induration: No Callus: No Crepitus: No Fluctuance: No Friable: No Rash: No Scarring: No Periwound Skin No Abnormalities Noted No Abnormalities Noted Moist: Yes Moisture: Maceration: No Dry/Scaly: No Periwound Skin Color: No Abnormalities Noted No Abnormalities Noted Atrophie Blanche: No Cyanosis: No Ecchymosis: No Erythema: No Hemosiderin  Staining: No Mottled: No Pallor: No Rubor: No Erythema Location: N/A N/A N/A Temperature: N/A N/A No Abnormality Tenderness on No No No Palpation: Wound Preparation: N/A N/A Ulcer Cleansing: Rinsed/Irrigated with Saline Topical Anesthetic Applied: Other: lidocaine 4% Wound Number: 7 [id number] N/A Photos: No Photos No Photos N/A Wound Location: Right Ischial Tuberosity Left Malleolus N/A Wounding Event: Pressure Injury Gradually Appeared N/A Primary Etiology: Pressure Ulcer Pressure Ulcer N/A Comorbid History: N/A Cataracts, Asthma, N/A Hypertension, Type II Diabetes, Neuropathy, Received Chemotherapy Date Acquired: 08/01/2015 09/12/2015 N/A Weeks of Treatment: 23 17 N/A Wound Status: Open Open N/A Measurements L x W x D 0.7x0.5x0.4 0.5x0.3x0.1 N/A (cm) Kuri, Chereese H. ([id number]) Area (cm) : 0.275 0.118 N/A Volume (cm) : 0.11 0.012 N/A % Reduction in Area: 91.20% -151.10% N/A % Reduction in Volume: 65.00% -33.30% N/A Undermining: N/A No N/A Classification: Category/Stage III Category/Stage II N/A HBO Classification: N/A Grade 0 N/A Exudate Amount: N/A Large N/A Exudate Type: N/A Serous N/A Exudate Color: N/A amber N/A Wound Margin: N/A Flat and Intact N/A Granulation Amount: N/A Medium (34-66%) N/A Granulation Quality: N/A Red, Pink N/A Necrotic Amount: N/A Medium (34-66%) N/A Exposed Structures: N/A Fascia: No N/A Fat: No Tendon: No Muscle: No Joint: No Bone: No Limited to Skin Breakdown Epithelialization: N/A None N/A Periwound Skin Texture: No Abnormalities Noted Edema: No N/A Excoriation: No Induration: No Callus: No Crepitus: No Fluctuance: No Friable: No Rash: No Scarring: No Periwound Skin No Abnormalities Noted Moist: Yes N/A Moisture: Maceration: No Dry/Scaly: No Periwound Skin Color: No Abnormalities Noted Erythema: Yes N/A Atrophie Blanche: No Cyanosis: No Ecchymosis: No Hemosiderin Staining: No Mottled: No Pallor: No Rubor:  No Erythema Location: N/A Circumferential N/A Temperature: N/A No Abnormality N/A Tenderness on No Yes N/A Palpation: Wound Preparation: N/A Ulcer Cleansing: N/A Rinsed/Irrigated with Saline [name], [name] ([id number]) Topical Anesthetic Applied: Other: Lidocaine 4% Treatment Notes Electronic Signature(s) Signed: 01/16/2016 5:08:36 PM By: Alric Quan Entered By: Alric Quan on 01/16/2016 16:15:31 [name], [name] ([id number]) -------------------------------------------------------------------------------- Multi-Disciplinary Care Plan Details Patient Name: [name], [name] Date of Service: 01/16/2016 3:45 PM Medical Record Number: [id number] Patient Account Number: [id number] Date of Birth/Sex: [birth year] (70 y.o. Female) Treating RN: Carolyne Fiscal, Debi Primary Care Physician: Paulita Cradle Other Clinician: Referring Physician: Paulita Cradle Treating Physician/Extender: Frann Rider in Treatment: 71 Active Inactive Abuse / Safety / Falls / Self Care Management Nursing Diagnoses: Impaired home maintenance Impaired physical mobility Knowledge deficit related to: safety; personal, health (wound), emergency Potential for falls Self care deficit: actual or potential Goals: Patient will remain injury free Date Initiated: 03/13/2015 Goal Status: Active Patient/caregiver will verbalize understanding of skin care regimen Date Initiated: 03/13/2015 Goal Status: Active Patient/caregiver will verbalize/demonstrate measure taken to improve self care Date Initiated: 03/13/2015 Goal Status: Active Patient/caregiver will verbalize/demonstrate measures taken to improve the patient's personal safety Date Initiated: 03/13/2015 Goal Status: Active Patient/caregiver will  verbalize/demonstrate measures taken to prevent injury and/or falls Date Initiated: 03/13/2015 Goal Status: Active Patient/caregiver will verbalize/demonstrate understanding of what to do in case of  emergency Date Initiated: 03/13/2015 Goal Status: Active Interventions: Assess fall risk on admission and as needed Assess: immobility, friction, shearing, incontinence upon admission and as needed Assess impairment of mobility on admission and as needed per policy Assess self care needs on admission and as needed Provide education on basic hygiene Furnari, Johnisha H. ([id number]) Provide education on fall prevention Provide education on personal and home safety Provide education on safe transfers Treatment Activities: Education provided on Basic Hygiene : 08/05/2015 Notes: Orientation to the Wound Care Program Nursing Diagnoses: Knowledge deficit related to the wound healing center program Goals: Patient/caregiver will verbalize understanding of the [place] Date Initiated: 03/13/2015 Goal Status: Active Interventions: Provide education on orientation to the wound center Notes: Pressure Nursing Diagnoses: Knowledge deficit related to causes and risk factors for pressure ulcer development Knowledge deficit related to management of pressures ulcers Potential for impaired tissue integrity related to pressure, friction, moisture, and shear Goals: Patient will remain free from development of additional pressure ulcers Date Initiated: 03/13/2015 Goal Status: Active Patient will remain free of pressure ulcers Date Initiated: 03/13/2015 Goal Status: Active Patient/caregiver will verbalize risk factors for pressure ulcer development Date Initiated: 03/13/2015 Goal Status: Active Patient/caregiver will verbalize understanding of pressure ulcer management Date Initiated: 03/13/2015 Goal Status: Active Interventions: Assess: immobility, friction, shearing, incontinence upon admission and as needed Bergthold, Yohana H. ([id number]) Assess offloading mechanisms upon admission and as needed Assess potential for pressure ulcer upon admission and as needed Provide education on  pressure ulcers Treatment Activities: Patient referred for home evaluation of offloading devices/mattresses : 01/02/2016 Patient referred for pressure reduction/relief devices : 01/02/2016 Patient referred for seating evaluation to ensure proper offloading : 01/02/2016 Pressure reduction/relief device ordered : 01/02/2016 Test ordered outside of clinic : 01/02/2016 Notes: Wound/Skin Impairment Nursing Diagnoses: Impaired tissue integrity Knowledge deficit related to ulceration/compromised skin integrity Goals: Patient/caregiver will verbalize understanding of skin care regimen Date Initiated: 03/13/2015 Goal Status: Active Ulcer/skin breakdown will have a volume reduction of 30% by week 4 Date Initiated: 03/13/2015 Goal Status: Active Ulcer/skin breakdown will have a volume reduction of 50% by week 8 Date Initiated: 03/13/2015 Goal Status: Active Ulcer/skin breakdown will have a volume reduction of 80% by week 12 Date Initiated: 03/13/2015 Goal Status: Active Ulcer/skin breakdown will heal within 14 weeks Date Initiated: 03/13/2015 Goal Status: Active Interventions: Assess patient/caregiver ability to obtain necessary supplies Assess patient/caregiver ability to perform ulcer/skin care regimen upon admission and as needed Assess ulceration(s) every visit Provide education on smoking Provide education on ulcer and skin care Treatment Activities: Patient referred to home care : 01/02/2016 RUITER, Neytiri H. ([id number]) Referred to DME [facility] for dressing supplies : 01/02/2016 Skin care regimen initiated : 01/02/2016 Topical wound management initiated : 01/02/2016 Notes: Electronic Signature(s) Signed: 01/16/2016 5:08:36 PM By: Alric Quan Entered By: Alric Quan on 01/16/2016 16:15:19 Krebbs, [place]. ([id number]) -------------------------------------------------------------------------------- Pain Assessment Details Patient Name: [name], [name] Date of Service: 01/16/2016 3:45  PM Medical Record Number: [id number] Patient Account Number: [id number] Date of Birth/Sex: 01-16-1946 (70 y.o. Female) Treating RN: Carolyne Fiscal, Debi Primary Care Physician: Paulita Cradle Other Clinician: Referring Physician: Paulita Cradle Treating Physician/Extender: Frann Rider in Treatment: 71 Active Problems Location of Pain Severity and Description of Pain Patient Has Paino No Site Locations Pain Management and Medication Current Pain Management: Electronic Signature(s) Signed: 01/16/2016 5:08:36 PM  By: Alric Quan Entered By: Alric Quan on 01/16/2016 15:51:06 Nicol, Serenidy Lemmie Evens ([id number]) -------------------------------------------------------------------------------- Patient/Caregiver Education Details Patient Name: [name], [name] Date of Service: 01/16/2016 3:45 PM Medical Record Number: [id number] Patient Account Number: [id number] Date of Birth/Gender: [birth year] (70 y.o. Female) Treating RN: Carolyne Fiscal, Debi Primary Care Physician: Paulita Cradle Other Clinician: Referring Physician: Paulita Cradle Treating Physician/Extender: Frann Rider in Treatment: 46 Education Assessment Education Provided To: Patient Education Topics Provided Wound/Skin Impairment: Handouts: Other: change dressings as directed Methods: Demonstration, Explain/Verbal Responses: State content correctly Electronic Signature(s) Signed: 01/16/2016 5:08:36 PM By: Alric Quan Entered By: Alric Quan on 01/16/2016 17:06:03 Burnside, Yaris H. ([id number]) -------------------------------------------------------------------------------- Wound Assessment Details Patient Name: Muzzy, Callahan H. Date of Service: 01/16/2016 3:45 PM Medical Record Number: [id number] Patient Account Number: [id number] Date of Birth/Sex: Jan 17, 1946 (70 y.o. Female) Treating RN: Carolyne Fiscal, Debi Primary Care Physician: Paulita Cradle Other Clinician: Referring Physician: Paulita Cradle Treating Physician/Extender: Frann Rider in Treatment: 53 Wound Status Wound Number: 10 Primary Etiology: Pressure Ulcer Wound Location: Right, Medial Foot Wound Status: Open Wounding Event: Gradually Appeared Date Acquired: 09/12/2015 Weeks Of Treatment: 17 Clustered Wound: No Photos Photo Uploaded By: Alric Quan on 01/16/2016 16:52:54 Wound Measurements Length: (cm) 0.3 Width: (cm) 0.3 Depth: (cm) 0.1 Area: (cm) 0.071 Volume: (cm) 0.007 % Reduction in Area: 69.9% % Reduction in Volume: 70.8% Wound Description Classification: Category/Stage II Periwound Skin Texture Texture Color No Abnormalities Noted: No No Abnormalities Noted: No Moisture No Abnormalities Noted: No Treatment Notes Wound #10 (Right, Medial Foot) 1. Cleansed with: [place], Katiya H. ([id number]) Clean wound with Normal Saline 2. Anesthetic Topical Lidocaine 4% cream to wound bed prior to debridement 4. Dressing Applied: Prisma Ag 5. Secondary Dressing Applied Dry Gauze Kerlix/Conform 7. Secured with Recruitment consultant) Signed: 01/16/2016 5:08:36 PM By: Alric Quan Entered By: Alric Quan on 01/16/2016 16:04:14 Mccauley, [place]. ([id number]) -------------------------------------------------------------------------------- Wound Assessment Details Patient Name: Hunsinger, Julien H. Date of Service: 01/16/2016 3:45 PM Medical Record Number: [id number] Patient Account Number: [id number] Date of Birth/Sex: [birth year] (70 y.o. Female) Treating RN: Carolyne Fiscal, Debi Primary Care Physician: Paulita Cradle Other Clinician: Referring Physician: Paulita Cradle Treating Physician/Extender: Frann Rider in Treatment: 44 Wound Status Wound Number: 11 Primary Etiology: Pressure Ulcer Wound Location: Right, Distal Calcaneus Wound Status: Open Wounding Event: Pressure Injury Date Acquired: 09/29/2015 Weeks Of Treatment: 15 Clustered Wound: No Photos Photo  Uploaded By: Alric Quan on 01/16/2016 16:52:54 Wound Measurements Length: (cm) 0.7 Width: (cm) 0.8 Depth: (cm) 0.1 Area: (cm) 0.44 Volume: (cm) 0.044 % Reduction in Area: 37.8% % Reduction in Volume: 38% Wound Description Classification: Category/Stage II Periwound Skin Texture Texture Color No Abnormalities Noted: No No Abnormalities Noted: No Moisture No Abnormalities Noted: No Treatment Notes Wound #11 (Right, Distal Calcaneus) 1. Cleansed with: Krist, Ellan H. ([id number]) Clean wound with Normal Saline 2. Anesthetic Topical Lidocaine 4% cream to wound bed prior to debridement 4. Dressing Applied: Prisma Ag 5. Secondary Dressing Applied Dry Gauze Kerlix/Conform 7. Secured with Recruitment consultant) Signed: 01/16/2016 5:08:36 PM By: Alric Quan Entered By: Alric Quan on 01/16/2016 16:05:07 Gangi, [place]. ([id number]) -------------------------------------------------------------------------------- Wound Assessment Details Patient Name: Arboleda, Khaya H. Date of Service: 01/16/2016 3:45 PM Medical Record Number: [id number] Patient Account Number: [id number] Date of Birth/Sex: [birth year] (70 y.o. Female) Treating RN: Ahmed Prima Primary Care Physician: Paulita Cradle Other Clinician: Referring Physician: Paulita Cradle Treating Physician/Extender: Frann Rider in Treatment: 44 Wound Status Wound Number: 3 Primary Pressure Ulcer Etiology: Wound Location: Back - Midline  Wound Open Wounding Event: Pressure Injury Status: Date Acquired: 05/02/2015 Comorbid Cataracts, Asthma, Hypertension, Type Weeks Of Treatment: 37 History: II Diabetes, Neuropathy, Received Clustered Wound: No Chemotherapy Wound Measurements Length: (cm) 2 Width: (cm) 2.5 Depth: (cm) 0.2 Area: (cm) 3.927 Volume: (cm) 0.785 % Reduction in Area: -1685% % Reduction in Volume: -3468.2% Epithelialization: None Tunneling: No Undermining:  Yes Starting Position (o'clock): 9 Ending Position (o'clock): 12 Maximum Distance: (cm) 1.1 Wound Description Classification: Category/Stage II Wound Margin: Distinct, outline attached Exudate Amount: Large Exudate Type: Serosanguineous Exudate Color: red, brown Foul Odor After Cleansing: No Wound Bed Granulation Amount: Medium (34-66%) Exposed Structure Granulation Quality: Red, Pink Fascia Exposed: No Necrotic Amount: Medium (34-66%) Fat Layer Exposed: No Necrotic Quality: Adherent Slough Tendon Exposed: No Muscle Exposed: No Joint Exposed: No Bone Exposed: No Limited to Skin Breakdown Periwound Skin Texture Texture Color Mittleman, Caprina H. ([id number]) No Abnormalities Noted: No No Abnormalities Noted: No Callus: No Atrophie Blanche: No Crepitus: No Cyanosis: No Excoriation: No Ecchymosis: No Fluctuance: No Erythema: No Friable: No Hemosiderin Staining: No Induration: No Mottled: No Localized Edema: No Pallor: No Rash: No Rubor: No Scarring: No Temperature / Pain Moisture Temperature: No Abnormality No Abnormalities Noted: No Dry / Scaly: No Maceration: No Moist: Yes Wound Preparation Ulcer Cleansing: Rinsed/Irrigated with Saline Topical Anesthetic Applied: Other: lidocaine 4%, Treatment Notes Wound #3 (Midline Back) 1. Cleansed with: Clean wound with Normal Saline 2. Anesthetic Topical Lidocaine 4% cream to wound bed prior to debridement 3. Peri-wound Care: Skin Prep 4. Dressing Applied: Santyl Ointment 5. Secondary Dressing Applied Bordered Foam Dressing Electronic Signature(s) Signed: 01/16/2016 5:08:36 PM By: Alric Quan Entered By: Alric Quan on 01/16/2016 16:12:32 Janice, Armella H. ([id number]) -------------------------------------------------------------------------------- Wound Assessment Details Patient Name: Cieslinski, Tarini H. Date of Service: 01/16/2016 3:45 PM Medical Record Number: [id number] Patient Account Number:  [id number] Date of Birth/Sex: [birth year] (70 y.o. Female) Treating RN: Carolyne Fiscal, Debi Primary Care Physician: Paulita Cradle Other Clinician: Referring Physician: Paulita Cradle Treating Physician/Extender: Frann Rider in Treatment: 69 Wound Status Wound Number: 7 Primary Etiology: Pressure Ulcer Wound Location: Right Ischial Tuberosity Wound Status: Open Wounding Event: Pressure Injury Date Acquired: 08/01/2015 Weeks Of Treatment: 23 Clustered Wound: No Photos Photo Uploaded By: Alric Quan on 01/16/2016 16:58:13 Wound Measurements Length: (cm) 0.7 Width: (cm) 0.5 Depth: (cm) 0.4 Area: (cm) 0.275 Volume: (cm) 0.11 % Reduction in Area: 91.2% % Reduction in Volume: 65% Wound Description Classification: Category/Stage III Periwound Skin Texture Texture Color No Abnormalities Noted: No No Abnormalities Noted: No Moisture No Abnormalities Noted: No Treatment Notes Wound #7 (Right Ischial Tuberosity) 1. Cleansed with: Dimond, Jaylean H. ([id number]) Clean wound with Normal Saline 2. Anesthetic Topical Lidocaine 4% cream to wound bed prior to debridement 3. Peri-wound Care: Skin Prep 4. Dressing Applied: Santyl Ointment 5. Secondary Dressing Applied Bordered Foam Dressing Electronic Signature(s) Signed: 01/16/2016 5:08:36 PM By: Alric Quan Entered By: Alric Quan on 01/16/2016 16:13:29 Mcjunkins, Malayasia H. ([id number]) -------------------------------------------------------------------------------- Wound Assessment Details Patient Name: Hepner, Ainsley H. Date of Service: 01/16/2016 3:45 PM Medical Record Number: [id number] Patient Account Number: [id number] Date of Birth/Sex: July 13, 1946 (70 y.o. Female) Treating RN: Carolyne Fiscal, Debi Primary Care Physician: Paulita Cradle Other Clinician: Referring Physician: Paulita Cradle Treating Physician/Extender: Frann Rider in Treatment: 44 Wound Status Wound Number: 8 Primary Pressure  Ulcer Etiology: Wound Location: Left Malleolus Wound Open Wounding Event: Gradually Appeared Status: Date Acquired: 09/12/2015 Comorbid Cataracts, Asthma, Hypertension, Type Weeks Of Treatment: 17 History: II Diabetes, Neuropathy, Received Clustered Wound: No Chemotherapy Photos Photo  Uploaded By: Alric Quan on 01/16/2016 16:58:29 Wound Measurements Length: (cm) 0.5 Width: (cm) 0.3 Depth: (cm) 0.1 Area: (cm) 0.118 Volume: (cm) 0.012 % Reduction in Area: -151.1% % Reduction in Volume: -33.3% Epithelialization: None Tunneling: No Undermining: No Wound Description Classification: Category/Stage II Foul Odor Af Diabetic Severity (Wagner): Grade 0 Wound Margin: Flat and Intact Exudate Amount: Large Exudate Type: Serous Exudate Color: amber ter Cleansing: No Wound Bed Granulation Amount: Medium (34-66%) Exposed Structure Granulation Quality: Red, Pink Fascia Exposed: No Necrotic Amount: Medium (34-66%) Fat Layer Exposed: No Polka, Shauni H. ([id number]) Necrotic Quality: Adherent Slough Tendon Exposed: No Muscle Exposed: No Joint Exposed: No Bone Exposed: No Limited to Skin Breakdown Periwound Skin Texture Texture Color No Abnormalities Noted: No No Abnormalities Noted: No Callus: No Atrophie Blanche: No Crepitus: No Cyanosis: No Excoriation: No Ecchymosis: No Fluctuance: No Erythema: Yes Friable: No Erythema Location: Circumferential Induration: No Hemosiderin Staining: No Localized Edema: No Mottled: No Rash: No Pallor: No Scarring: No Rubor: No Moisture Temperature / Pain No Abnormalities Noted: No Temperature: No Abnormality Dry / Scaly: No Tenderness on Palpation: Yes Maceration: No Moist: Yes Wound Preparation Ulcer Cleansing: Rinsed/Irrigated with Saline Topical Anesthetic Applied: Other: Lidocaine 4%, Treatment Notes Wound #8 (Left Malleolus) 1. Cleansed with: Clean wound with Normal Saline 2. Anesthetic Topical Lidocaine  4% cream to wound bed prior to debridement 4. Dressing Applied: Prisma Ag 5. Secondary Dressing Applied Dry Gauze Kerlix/Conform 7. Secured with Recruitment consultant) Signed: 01/16/2016 5:08:36 PM By: Alric Quan Entered By: Alric Quan on 01/16/2016 16:00:56 Bedoy, Nara H. ([id number]) Bonham, Graball ([id number]) -------------------------------------------------------------------------------- Vitals Details Patient Name: [name], Bexleigh H. Date of Service: 01/16/2016 3:45 PM Medical Record Number: [id number] Patient Account Number: [id number] Date of Birth/Sex: 1946/05/05 (70 y.o. Female) Treating RN: Carolyne Fiscal, Debi Primary Care Physician: Paulita Cradle Other Clinician: Referring Physician: Paulita Cradle Treating Physician/Extender: Frann Rider in Treatment: 50 Vital Signs Time Taken: 15:51 Temperature (F): 98.2 Height (in): 65 Pulse (bpm): 96 Weight (lbs): 100 Respiratory Rate (breaths/min): 16 Body Mass Index (BMI): 16.6 Blood Pressure (mmHg): 115/71 Reference Range: 80 - 120 mg / dl Electronic Signature(s) Signed: 01/16/2016 5:08:36 PM By: Alric Quan Entered By: Alric Quan on 01/16/2016 15:52:45

## 2016-01-19 NOTE — Progress Notes (Signed)
[name], [name] ([id number]) Visit Report for 01/16/2016 Chief Complaint Document Details Patient Name: [name], [name] Date of Service: 01/16/2016 3:45 PM Medical Record Number: [id number] Patient Account Number: [id number] Date of Birth/Sex: 1946/03/23 (70 y.o. Female) Treating RN: Montey Hora Primary Care Physician: Paulita Cradle Other Clinician: Referring Physician: Paulita Cradle Treating Physician/Extender: Frann Rider in Treatment: 6 Information Obtained from: Patient Chief Complaint Patient presents to the wound care center for a consult due non healing wound. Ulcers on the right elbow and the right heel for about 1 month. Electronic Signature(s) Signed: 01/16/2016 4:29:08 PM By: Christin Fudge MD, FACS Entered By: Christin Fudge on 01/16/2016 16:29:08 [name], [name] ([id number]) -------------------------------------------------------------------------------- HPI Details Patient Name: [name], [name] Date of Service: 01/16/2016 3:45 PM Medical Record Number: [id number] Patient Account Number: [id number] Date of Birth/Sex: May 19, 1946 (70 y.o. Female) Treating RN: Montey Hora Primary Care Physician: Paulita Cradle Other Clinician: Referring Physician: Paulita Cradle Treating Physician/Extender: Frann Rider in Treatment: 55 History of Present Illness Location: Ulceration on the right heel and the right elbow. Quality: Patient reports experiencing a dull pain to affected area(s). Severity: Patient states wound (s) are getting better. Duration: Patient has had the wound for < 4 weeks prior to presenting for treatment Timing: Pain in wound is Intermittent (comes and goes Context: The wound appeared gradually over time Modifying Factors: Consults to this date include:Augmentin and Bactrim and also some heel protection with duoderm Associated Signs and Symptoms: Patient reports having difficulty standing for long periods. HPI Description:  70 year old female with history of peripheral neuropathy, history of diet controlled diabetes mellitus type 2, history of alcoholism here for wound consult sent by her PCP Dr. Sherilyn Cooter. She has pressure ulcers at her right elbow, bilateral heels. Plain films of right calcaneus without acute bony process. Patient started by PCP on Augmentin, Bactrim as per orders, DuoDerm dressings applied - reports some improvement in her ulcer since last seen. Denies fever, chills, nausea, vomiting, diarrhea. She had a right humerus fracture in the middle of May and has had no surgery and arm is in a sling. She is also been laying in the bed for quite a while. Past medical history significant for essential hypertension, osteoporosis, peripheral neuropathy, alcoholism, ataxia, personal history of breast cancer treated with surgery chemotherapy and radiation and this was done in December 2010. she is also status post laparoscopic cholecystectomy, pilonidal cyst excision, subcutaneous port placement, partial mastectomy on the left side, skin cancer removal. 03/21/2015 -- she says overall she's been doing better and continues to smoke about 15 cigarettes a day. 03/21/2015 - her orthopedic doctor has said she may require surgery for her right humerus fracture. 04/04/2015 -- her orthopedic surgery has been scheduled for August 11. 04/18/2015 -- she is doing fine as far as her elbow and her right heel goes but she has developed some redness over prominence on her thoracic spine and wanted me to take a look at this. 05/02/2015 -- she had her surgery done and now is in a sling and support. Her back has developed a pressure injury of undetermined stage. She seems to be in better spirits. 05/16/2015 -- last week her right heel was looking great and we had healed it out, but she has not been offloading appropriately and has a deep tissue injury on the right heel again. The area on her right elbow has opened out with slough and  the area in the thoracic spine is also getting worse. 05/30/2015 -- she has developed  2 new ulcerations one on her left ischial tuberosity and one on the sacral region. She is still working on getting up smoking but is also unable to take her vitamins as she says she develops a diarrhea when she takes vitamins. She has increased her intake of proteins. 06/06/2015 -- the patient's husband manages to get her a low air loss mattress with initiating pressure but [name], [name] ([id number]) did not know how to use it exactly and the patient was not happy about using it. Overall she says she's been feeling better. 07/11/2015 -- . Discussed a surgical opinion for debridement and application of a wound VAC, 2 weeks ago but the patient has been reluctant to get a surgical opinion as she wants to avoid surgery. 07/18/2015 -- they have an appointment to see Dr. Tamala Julian this coming Wednesday. 07/29/2015 -- they saw Dr. Tamala Julian in his office and he did a debridement of the wound and removed significant amount of slough. This is in addition to the debridement I had done previously on Friday where a large amount of the eschar was removed. He did not recommend the application of wound VAC. Addendum : Dr. Thompson Caul note was reviewed via EPIC and details noted as above. 08/05/2015 -- over the last week she has developed a another pressure injury to her right hip and has had significant discoloration of the skin and an eschar there. 08/15/2015 -- she is still smoking about a pack of cigarettes a day and does not seem to want to quit. They have not been able to talk to the vendor regarding the air mattress and I will ask them to get in touch again. She is reluctant to take vitamins and does admit her nutrition is poor. 08/22/2015 -- she has been unable to tolerate her vitamins and continues to smoke. They're working on getting a low air loss mattress and have been speaking with the vendor. 09/19/2015 -- since her last  visit and was admitted to the hospital between 09/09/2015 and 09/16/2015. She was thought to have an active sepsis possibly from one of her decubitus ulcers but nothing was grown except for an MSSA from her thoracic spine region. CT of this area did not show any osteomyelitis. Been given IV antibiotics which included vancomycin and Zosyn in the hospital under the care of Dr. [name] the ID specialist she was sent home on oral Keflex 500 mg 3 times a day for 2 weeks. he will consider an MRI in the outpatient setting to completely rule out osteomyelitis of the spine. 10/03/2015 --readmitted to hospital on 09/23/2015 for general debility, lethargy and possible sepsis and workup was in progress. Seen by Dr. [name] and he would also like a workup for underlying malignancy as sheos had previous ultrasounds of the breasts suggesting findings of concern. Workup done so far does not suggest deep bony infection. She was treated for a pneumonia and received Zosyn and vancomycin. She had been recommended Cipro 500 twice a day and doxycycline 100 mg twice a day once she was to be [place] home. The antibiotics were to be stopped on 10/07/2015. 10/17/2015 -- patient is feeling much better and has been eating better and doing her offloading as much as possible. 10/23/2015 -- she has an appointment to see Dr. [name] tomorrow but other than that has been doing as much as possible with offloading and increasing her diet. 11/07/2015 -- she has not been here to see [place] for 2 weeks and at the present time continues to  try and eat better, work on off loading and is using the air mattress at night. She saw her PCP yesterday and she has put her back on a fentanyl patch. 11/21/2015 -- for some strange reason she has been sitting in a chair with her legs dependent for the last 6 days nonstop and has developed massive lower extremity edema and pedal edema with weeping and ulceration. Electronic  Signature(s) Signed: 01/16/2016 4:29:14 PM By: Christin Fudge MD, FACS Entered By: Christin Fudge on 01/16/2016 16:29:14 [name], [name] ([id number]) -------------------------------------------------------------------------------- Physical Exam Details Patient Name: [name], [name] Date of Service: 01/16/2016 3:45 PM Medical Record Number: [id number] Patient Account Number: [id number] Date of Birth/Sex: 08/11/1946 (70 y.o. Female) Treating RN: Montey Hora Primary Care Physician: Paulita Cradle Other Clinician: Referring Physician: Paulita Cradle Treating Physician/Extender: Frann Rider in Treatment: 44 Constitutional . Pulse regular. Respirations normal and unlabored. Afebrile. . Eyes Nonicteric. Reactive to light. Ears, Nose, Mouth, and Throat Lips, teeth, and gums WNL.Marland Kitchen Moist mucosa without lesions. Neck supple and nontender. No palpable supraclavicular or cervical adenopathy. Normal sized without goiter. Respiratory WNL. No retractions.. Cardiovascular Pedal Pulses WNL. No clubbing, cyanosis or edema. Lymphatic No adneopathy. No adenopathy. No adenopathy. Musculoskeletal Adexa without tenderness or enlargement.. Digits and nails w/o clubbing, cyanosis, infection, petechiae, ischemia, or inflammatory conditions.. Integumentary (Hair, Skin) No suspicious lesions. No crepitus or fluctuance. No peri-wound warmth or erythema. No masses.Marland Kitchen Psychiatric Judgement and insight Intact.. No evidence of depression, anxiety, or agitation.. Notes the fungal infection still persist and we have asked her to continue with Lotrisone cream on her feet. The right hip wound and the thoracic spine wound will need central ointment and the patient does not agree to any debridement today. all the other wounds on the feet are looking much cleaner and we will go with Reola Calkins Electronic Signature(s) Signed: 01/16/2016 4:30:23 PM By: Christin Fudge MD, FACS Entered By: Christin Fudge on  01/16/2016 16:30:22 Carandang, Mizani H. ([id number]) -------------------------------------------------------------------------------- Physician Orders Details Patient Name: [name], [name] Date of Service: 01/16/2016 3:45 PM Medical Record Number: [id number] Patient Account Number: [id number] Date of Birth/Sex: 07-07-1946 (70 y.o. Female) Treating RN: Carolyne Fiscal, Debi Primary Care Physician: Paulita Cradle Other Clinician: Referring Physician: Paulita Cradle Treating Physician/Extender: Frann Rider in Treatment: 13 Verbal / Phone Orders: Yes Clinician: Carolyne Fiscal, Debi Read Back and Verified: Yes Diagnosis Coding Wound Cleansing Wound #10 Right,Medial Foot o Clean wound with Normal Saline. Wound #11 Right,Distal Calcaneus o Clean wound with Normal Saline. Wound #3 Midline Back o Clean wound with Normal Saline. Wound #7 Right Ischial Tuberosity o Clean wound with Normal Saline. Wound #8 Left Malleolus o Clean wound with Normal Saline. Anesthetic Wound #10 Right,Medial Foot o Topical Lidocaine 4% cream applied to wound bed prior to debridement Wound #11 Right,Distal Calcaneus o Topical Lidocaine 4% cream applied to wound bed prior to debridement Wound #3 Midline Back o Topical Lidocaine 4% cream applied to wound bed prior to debridement Wound #7 Right Ischial Tuberosity o Topical Lidocaine 4% cream applied to wound bed prior to debridement Wound #8 Left Malleolus o Topical Lidocaine 4% cream applied to wound bed prior to debridement Skin Barriers/Peri-Wound Care o Antifungal cream - bilateral feet not on wounds Primary Wound Dressing Wound #10 Right,Medial Foot o Prisma Ag [name], [name] ([id number]) Wound #11 Right,Distal Calcaneus o Prisma Ag Wound #3 Midline Back o Santyl Ointment Wound #7 Right Ischial Tuberosity o Santyl Ointment - R ischial tuberosity also pack lightly with packing strip Wound #8  Left Malleolus o Prisma  Ag Secondary Dressing Wound #10 Right,Medial Foot o Dry Gauze o Gauze and Kerlix/Conform Wound #11 Right,Distal Calcaneus o Dry Gauze o Gauze and Kerlix/Conform Wound #3 Midline Back o Boardered Foam Dressing Wound #7 Right Ischial Tuberosity o Boardered Foam Dressing Wound #8 Left Malleolus o Dry Gauze o Conform/Kerlix Dressing Change Frequency Wound #10 Right,Medial Foot o Change dressing every day. Wound #11 Right,Distal Calcaneus o Change dressing every day. Wound #3 Midline Back o Change dressing every day. Wound #7 Right Ischial Tuberosity o Change dressing every day. Wound #8 Left Malleolus o Change dressing every day. [place], Montzerrat H. ([id number]) Follow-up Appointments Wound #10 Right,Medial Foot o Return Appointment in 1 week. Wound #11 Right,Distal Calcaneus o Return Appointment in 1 week. Wound #3 Midline Back o Return Appointment in 1 week. Wound #7 Right Ischial Tuberosity o Return Appointment in 1 week. Wound #8 Left Malleolus o Return Appointment in 1 week. Home Health Wound #10 [place] Visits - [place] Nurse may visit PRN to address patientos wound care needs. o FACE TO FACE ENCOUNTER: MEDICARE and MEDICAID PATIENTS: I certify that this patient is under my care and that I had a face-to-face encounter that meets the physician face-to-face encounter requirements with this patient on this date. The encounter with the patient was in whole or in part for the following MEDICAL CONDITION: (primary reason for [place]) MEDICAL NECESSITY: I certify, that based on my findings, NURSING services are a medically necessary home health service. HOME BOUND STATUS: I certify that my clinical findings support that this patient is homebound (i.e., Due to illness or injury, pt requires aid of supportive devices such as crutches, cane, wheelchairs, walkers, the use of  special transportation or the assistance of another person to leave their place of residence. There is a normal inability to leave the home and doing so requires considerable and taxing effort. Other absences are for medical reasons / religious services and are infrequent or of short duration when for other reasons). o If current dressing causes regression in wound condition, may D/C ordered dressing product/s and apply Normal Saline Moist Dressing daily until next [place] / Other MD appointment. [place] of regression in wound condition at [phone]. o Please direct any NON-WOUND related issues/requests for orders to patient's Primary Care Physician Wound #11 [place] Visits - [place] Nurse may visit PRN to address patientos wound care needs. o FACE TO FACE ENCOUNTER: MEDICARE and MEDICAID PATIENTS: I certify that this patient is under my care and that I had a face-to-face encounter that meets the physician face-to-face encounter requirements with this patient on this date. The encounter with the patient was in whole or in part for the following MEDICAL CONDITION: (primary reason for [place]) MEDICAL NECESSITY: I certify, that based on my findings, NURSING services are a medically necessary home health service. HOME BOUND STATUS: I certify that my clinical findings support that this patient is homebound (i.e., Due to illness or injury, pt requires aid of supportive devices such as crutches, cane, wheelchairs, walkers, the use of special transportation or the assistance of another person to leave their place of residence. There is a Schwalb, Kortny H. ([id number]) normal inability to leave the home and doing so requires considerable and taxing effort. Other absences are for medical reasons / religious services and are infrequent or of short duration when for other reasons).   o If current  dressing causes regression in wound condition, may D/C ordered dressing product/s and apply Normal Saline Moist Dressing daily until next [place] / Other MD appointment. [place] of regression in wound condition at [phone]. o Please direct any NON-WOUND related issues/requests for orders to patient's Primary Care Physician Wound #3 Midline Back o [place] Visits - [place] Nurse may visit PRN to address patientos wound care needs. o FACE TO FACE ENCOUNTER: MEDICARE and MEDICAID PATIENTS: I certify that this patient is under my care and that I had a face-to-face encounter that meets the physician face-to-face encounter requirements with this patient on this date. The encounter with the patient was in whole or in part for the following MEDICAL CONDITION: (primary reason for [place]) MEDICAL NECESSITY: I certify, that based on my findings, NURSING services are a medically necessary home health service. HOME BOUND STATUS: I certify that my clinical findings support that this patient is homebound (i.e., Due to illness or injury, pt requires aid of supportive devices such as crutches, cane, wheelchairs, walkers, the use of special transportation or the assistance of another person to leave their place of residence. There is a normal inability to leave the home and doing so requires considerable and taxing effort. Other absences are for medical reasons / religious services and are infrequent or of short duration when for other reasons). o If current dressing causes regression in wound condition, may D/C ordered dressing product/s and apply Normal Saline Moist Dressing daily until next [place] / Other MD appointment. [place] of regression in wound condition at [phone]. o Please direct any NON-WOUND related issues/requests for orders to patient's Primary Care Physician Wound #7  Right Ischial [place] Visits - [place] Nurse may visit PRN to address patientos wound care needs. o FACE TO FACE ENCOUNTER: MEDICARE and MEDICAID PATIENTS: I certify that this patient is under my care and that I had a face-to-face encounter that meets the physician face-to-face encounter requirements with this patient on this date. The encounter with the patient was in whole or in part for the following MEDICAL CONDITION: (primary reason for [place]) MEDICAL NECESSITY: I certify, that based on my findings, NURSING services are a medically necessary home health service. HOME BOUND STATUS: I certify that my clinical findings support that this patient is homebound (i.e., Due to illness or injury, pt requires aid of supportive devices such as crutches, cane, wheelchairs, walkers, the use of special transportation or the assistance of another person to leave their place of residence. There is a normal inability to leave the home and doing so requires considerable and taxing effort. Other absences are for medical reasons / religious services and are infrequent or of short duration when for other reasons). o If current dressing causes regression in wound condition, may D/C ordered dressing product/s and apply Normal Saline Moist Dressing daily until next [place] / Other MD appointment. [place] of regression in wound condition at [phone]. o Please direct any NON-WOUND related issues/requests for orders to patient's Primary Care Physician [place], Babetta Lemmie Evens ([id number]) Wound #8 Left [place] Visits - [place] Nurse may visit PRN to address patientos wound care needs. o FACE TO FACE ENCOUNTER: MEDICARE and MEDICAID PATIENTS: I certify that this patient is under my care and that I had a face-to-face encounter that meets the  physician face-to-face encounter  requirements with this patient on this date. The encounter with the patient was in whole or in part for the following MEDICAL CONDITION: (primary reason for [place]) MEDICAL NECESSITY: I certify, that based on my findings, NURSING services are a medically necessary home health service. HOME BOUND STATUS: I certify that my clinical findings support that this patient is homebound (i.e., Due to illness or injury, pt requires aid of supportive devices such as crutches, cane, wheelchairs, walkers, the use of special transportation or the assistance of another person to leave their place of residence. There is a normal inability to leave the home and doing so requires considerable and taxing effort. Other absences are for medical reasons / religious services and are infrequent or of short duration when for other reasons). o If current dressing causes regression in wound condition, may D/C ordered dressing product/s and apply Normal Saline Moist Dressing daily until next [place] / Other MD appointment. [place] of regression in wound condition at [phone]. o Please direct any NON-WOUND related issues/requests for orders to patient's Primary Care Physician Electronic Signature(s) Signed: 01/16/2016 5:08:36 PM By: Alric Quan Signed: 01/19/2016 4:11:53 PM By: Christin Fudge MD, FACS Previous Signature: 01/16/2016 4:31:56 PM Version By: Christin Fudge MD, FACS Entered By: Alric Quan on 01/16/2016 17:00:59 [place], Emerly H. ([id number]) -------------------------------------------------------------------------------- Problem List Details Patient Name: Pifer, Carola H. Date of Service: 01/16/2016 3:45 PM Medical Record Number: [id number] Patient Account Number: [id number] Date of Birth/Sex: Feb 13, 1946 (70 y.o. Female) Treating RN: Montey Hora Primary Care Physician: Paulita Cradle Other Clinician: Referring Physician: Paulita Cradle Treating  Physician/Extender: Frann Rider in Treatment: 68 Active Problems ICD-10 Encounter Code Description Active Date Diagnosis E11.621 Type 2 diabetes mellitus with foot ulcer 03/13/2015 Yes L89.613 Pressure ulcer of right heel, stage 3 03/13/2015 Yes F17.218 Nicotine dependence, cigarettes, with other nicotine- 03/13/2015 Yes induced disorders L89.100 Pressure ulcer of unspecified part of back, unstageable 05/02/2015 Yes L89.213 Pressure ulcer of right hip, stage 3 08/05/2015 Yes I89.0 Lymphedema, not elsewhere classified 11/21/2015 Yes L97.211 Non-pressure chronic ulcer of right calf limited to 11/21/2015 Yes breakdown of skin Inactive Problems Resolved Problems ICD-10 Code Description Active Date Resolved Date L89.013 Pressure ulcer of right elbow, stage 3 03/13/2015 03/13/2015 [id number] Pressure ulcer of left hip, stage 2 05/30/2015 05/30/2015 [name], [name] ([id number]) L89.153 Pressure ulcer of sacral region, stage 3 05/30/2015 05/30/2015 Electronic Signature(s) Signed: 01/16/2016 4:29:03 PM By: Christin Fudge MD, FACS Entered By: Christin Fudge on 01/16/2016 16:29:03 [name], [name] ([id number]) -------------------------------------------------------------------------------- Progress Note Details Patient Name: [name], [name] Date of Service: 01/16/2016 3:45 PM Medical Record Number: [id number] Patient Account Number: [id number] Date of Birth/Sex: [birth year] (70 y.o. Female) Treating RN: Montey Hora Primary Care Physician: Paulita Cradle Other Clinician: Referring Physician: Paulita Cradle Treating Physician/Extender: Frann Rider in Treatment: 78 Subjective Chief Complaint Information obtained from Patient Patient presents to the wound care center for a consult due non healing wound. Ulcers on the right elbow and the right heel for about 1 month. History of Present Illness (HPI) The following HPI elements were documented for the patient's wound: Location:  Ulceration on the right heel and the right elbow. Quality: Patient reports experiencing a dull pain to affected area(s). Severity: Patient states wound (s) are getting better. Duration: Patient has had the wound for < 4 weeks prior to presenting for treatment Timing: Pain in wound is Intermittent (comes and goes Context: The wound appeared gradually over time Modifying Factors: Consults  to this date include:Augmentin and Bactrim and also some heel protection with duoderm Associated Signs and Symptoms: Patient reports having difficulty standing for long periods. 70 year old female with history of peripheral neuropathy, history of diet controlled diabetes mellitus type 2, history of alcoholism here for wound consult sent by her PCP Dr. Sherilyn Cooter. She has pressure ulcers at her right elbow, bilateral heels. Plain films of right calcaneus without acute bony process. Patient started by PCP on Augmentin, Bactrim as per orders, DuoDerm dressings applied - reports some improvement in her ulcer since last seen. Denies fever, chills, nausea, vomiting, diarrhea. She had a right humerus fracture in the middle of May and has had no surgery and arm is in a sling. She is also been laying in the bed for quite a while. Past medical history significant for essential hypertension, osteoporosis, peripheral neuropathy, alcoholism, ataxia, personal history of breast cancer treated with surgery chemotherapy and radiation and this was done in December 2010. she is also status post laparoscopic cholecystectomy, pilonidal cyst excision, subcutaneous port placement, partial mastectomy on the left side, skin cancer removal. 03/21/2015 -- she says overall she's been doing better and continues to smoke about 15 cigarettes a day. 03/21/2015 - her orthopedic doctor has said she may require surgery for her right humerus fracture. 04/04/2015 -- her orthopedic surgery has been scheduled for August 11. 04/18/2015 -- she is doing  fine as far as her elbow and her right heel goes but she has developed some redness over prominence on her thoracic spine and wanted me to take a look at this. Derk, Katheryn H. ([id number]) 05/02/2015 -- she had her surgery done and now is in a sling and support. Her back has developed a pressure injury of undetermined stage. She seems to be in better spirits. 05/16/2015 -- last week her right heel was looking great and we had healed it out, but she has not been offloading appropriately and has a deep tissue injury on the right heel again. The area on her right elbow has opened out with slough and the area in the thoracic spine is also getting worse. 05/30/2015 -- she has developed 2 new ulcerations one on her left ischial tuberosity and one on the sacral region. She is still working on getting up smoking but is also unable to take her vitamins as she says she develops a diarrhea when she takes vitamins. She has increased her intake of proteins. 06/06/2015 -- the patient's husband manages to get her a low air loss mattress with initiating pressure but did not know how to use it exactly and the patient was not happy about using it. Overall she says she's been feeling better. 07/11/2015 -- . Discussed a surgical opinion for debridement and application of a wound VAC, 2 weeks ago but the patient has been reluctant to get a surgical opinion as she wants to avoid surgery. 07/18/2015 -- they have an appointment to see Dr. Tamala Julian this coming Wednesday. 07/29/2015 -- they saw Dr. Tamala Julian in his office and he did a debridement of the wound and removed significant amount of slough. This is in addition to the debridement I had done previously on Friday where a large amount of the eschar was removed. He did not recommend the application of wound VAC. Addendum : Dr. Thompson Caul note was reviewed via EPIC and details noted as above. 08/05/2015 -- over the last week she has developed a another pressure injury to her  right hip and has had significant discoloration of the skin  and an eschar there. 08/15/2015 -- she is still smoking about a pack of cigarettes a day and does not seem to want to quit. They have not been able to talk to the vendor regarding the air mattress and I will ask them to get in touch again. She is reluctant to take vitamins and does admit her nutrition is poor. 08/22/2015 -- she has been unable to tolerate her vitamins and continues to smoke. They're working on getting a low air loss mattress and have been speaking with the vendor. 09/19/2015 -- since her last visit and was admitted to the hospital between 09/09/2015 and 09/16/2015. She was thought to have an active sepsis possibly from one of her decubitus ulcers but nothing was grown except for an MSSA from her thoracic spine region. CT of this area did not show any osteomyelitis. Been given IV antibiotics which included vancomycin and Zosyn in the hospital under the care of Dr. [name] the ID specialist she was sent home on oral Keflex 500 mg 3 times a day for 2 weeks. he will consider an MRI in the outpatient setting to completely rule out osteomyelitis of the spine. 10/03/2015 --readmitted to hospital on 09/23/2015 for general debility, lethargy and possible sepsis and workup was in progress. Seen by Dr. [name] and he would also like a workup for underlying malignancy as she s had previous ultrasounds of the breasts suggesting findings of concern. Workup done so far does not suggest deep bony infection. She was treated for a pneumonia and received Zosyn and vancomycin. She had been recommended Cipro 500 twice a day and doxycycline 100 mg twice a day once she was to be [place] home. The antibiotics were to be stopped on 10/07/2015. 10/17/2015 -- patient is feeling much better and has been eating better and doing her offloading as much as possible. 10/23/2015 -- she has an appointment to see Dr. [name] tomorrow but other than  that has been doing as much as possible with offloading and increasing her diet. 11/07/2015 -- she has not been here to see [place] for 2 weeks and at the present time continues to try and eat better, work on off loading and is using the air mattress at night. She saw her PCP yesterday and she has put her back on a fentanyl patch. 11/21/2015 -- for some strange reason she has been sitting in a chair with her legs dependent for the last 6 days nonstop and has developed massive lower extremity edema and pedal edema with weeping and ulceration. [place]olia, Tilley H. ([id number]) Objective Constitutional Pulse regular. Respirations normal and unlabored. Afebrile. Vitals Time Taken: 3:51 PM, Height: 65 in, Weight: 100 lbs, BMI: 16.6, Temperature: 98.2 F, Pulse: 96 bpm, Respiratory Rate: 16 breaths/min, Blood Pressure: 115/71 mmHg. Eyes Nonicteric. Reactive to light. Ears, Nose, Mouth, and Throat Lips, teeth, and gums WNL.Marland Kitchen Moist mucosa without lesions. Neck supple and nontender. No palpable supraclavicular or cervical adenopathy. Normal sized without goiter. Respiratory WNL. No retractions.. Cardiovascular Pedal Pulses WNL. No clubbing, cyanosis or edema. Lymphatic No adneopathy. No adenopathy. No adenopathy. Musculoskeletal Adexa without tenderness or enlargement.. Digits and nails w/o clubbing, cyanosis, infection, petechiae, ischemia, or inflammatory conditions.Marland Kitchen Psychiatric Judgement and insight Intact.. No evidence of depression, anxiety, or agitation.. General Notes: the fungal infection still persist and we have asked her to continue with Lotrisone cream on her feet. The right hip wound and the thoracic spine wound will need central ointment and the patient does not agree to any debridement today. all  the other wounds on the feet are looking much cleaner and we will go with [employer]isma [employer] Integumentary (Hair, Skin) No suspicious lesions. No crepitus or fluctuance. No peri-wound warmth or  erythema. No masses.. Wound #10 status is Open. Original cause of wound was Gradually Appeared. The wound is located on the [place], [place] ([id number]) Right,Medial Foot. The wound measures 0.3cm length x 0.3cm width x 0.1cm depth; 0.071cm^2 area and 0.007cm^3 volume. Wound #11 status is Open. Original cause of wound was Pressure Injury. The wound is located on the Right,Distal Calcaneus. The wound measures 0.7cm length x 0.8cm width x 0.1cm depth; 0.44cm^2 area and 0.044cm^3 volume. Wound #3 status is Open. Original cause of wound was Pressure Injury. The wound is located on the Midline Back. The wound measures 2cm length x 2.5cm width x 0.2cm depth; 3.927cm^2 area and 0.785cm^3 volume. The wound is limited to skin breakdown. There is no tunneling noted, however, there is undermining starting at 9:00 and ending at 12:00 with a maximum distance of 1.1cm. There is a large amount of serosanguineous drainage noted. The wound margin is distinct with the outline attached to the wound base. There is medium (34-66%) red, pink granulation within the wound bed. There is a medium (34- 66%) amount of necrotic tissue within the wound bed including Adherent Slough. The periwound skin appearance exhibited: Moist. The periwound skin appearance did not exhibit: Callus, Crepitus, Excoriation, Fluctuance, Friable, Induration, Localized Edema, Rash, Scarring, Dry/Scaly, Maceration, Atrophie Blanche, Cyanosis, Ecchymosis, Hemosiderin Staining, Mottled, Pallor, Rubor, Erythema. Periwound temperature was noted as No Abnormality. Wound #7 status is Open. Original cause of wound was Pressure Injury. The wound is located on the Right Ischial Tuberosity. The wound measures 0.7cm length x 0.5cm width x 0.4cm depth; 0.275cm^2 area and 0.11cm^3 volume. Wound #8 status is Open. Original cause of wound was Gradually Appeared. The wound is located on the Left Malleolus. The wound measures 0.5cm length x 0.3cm width x 0.1cm  depth; 0.118cm^2 area and 0.012cm^3 volume. The wound is limited to skin breakdown. There is no tunneling or undermining noted. There is a large amount of serous drainage noted. The wound margin is flat and intact. There is medium (34-66%) red, pink granulation within the wound bed. There is a medium (34-66%) amount of necrotic tissue within the wound bed including Adherent Slough. The periwound skin appearance exhibited: Moist, Erythema. The periwound skin appearance did not exhibit: Callus, Crepitus, Excoriation, Fluctuance, Friable, Induration, Localized Edema, Rash, Scarring, Dry/Scaly, Maceration, Atrophie Blanche, Cyanosis, Ecchymosis, Hemosiderin Staining, Mottled, Pallor, Rubor. The surrounding wound skin color is noted with erythema which is circumferential. Periwound temperature was noted as No Abnormality. The periwound has tenderness on palpation. 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 ([id number]) Plan Wound Cleansing: Wound #10 Right,Medial Foot: Clean wound with Normal Saline. Wound #11 Right,Distal Calcaneus: Clean wound with Normal Saline. Wound #3 Midline Back: Clean wound with Normal Saline. Wound #7 Right Ischial Tuberosity: Clean wound with Normal Saline. Wound #8 Left Malleolus: Clean wound with Normal Saline. Anesthetic: Wound #10 Right,Medial Foot: Topical Lidocaine 4% cream applied to wound bed prior to debridement Wound #11 Right,Distal  Calcaneus: Topical Lidocaine 4% cream applied to wound bed prior to debridement Wound #3 Midline  Back: Topical Lidocaine 4% cream applied to wound bed prior to debridement Wound #7 Right Ischial Tuberosity: Topical Lidocaine 4% cream applied to wound bed prior to debridement Wound #8 Left Malleolus: Topical Lidocaine 4% cream applied to wound bed prior to debridement Skin Barriers/Peri-Wound Care: Antifungal cream - bilateral feet not on wounds Primary Wound Dressing: Wound #10 Right,Medial Foot: Prisma Ag Wound #11 Right,Distal Calcaneus: Prisma Ag Wound #3 Midline Back: Santyl Ointment Wound #7 Right Ischial Tuberosity: Santyl Ointment - R ischial tuberosity also pack lightly with packing strip Wound #8 Left Malleolus: Prisma Ag Secondary Dressing: Wound #10 Right,Medial Foot: Dry Gauze Gauze and Kerlix/Conform Wound #11 Right,Distal Calcaneus: Dry Gauze Gauze and Kerlix/Conform Hentz, Eliette H. ([id number]) Wound #3 Midline Back: Boardered Foam Dressing Wound #7 Right Ischial Tuberosity: Boardered Foam Dressing Wound #8 Left Malleolus: Dry Gauze Conform/Kerlix Dressing Change Frequency: Wound #10 Right,Medial Foot: Change dressing every day. Wound #11 Right,Distal Calcaneus: Change dressing every day. Wound #3 Midline Back: Change dressing every day. Wound #7 Right Ischial Tuberosity: Change dressing every day. Wound #8 Left Malleolus: Change dressing every day. Follow-up Appointments: Wound #10 Right,Medial Foot: Return Appointment in 1 week. Wound #11 Right,Distal Calcaneus: Return Appointment in 1 week. Wound #3 Midline Back: Return Appointment in 1 week. Wound #7 Right Ischial Tuberosity: Return Appointment in 1 week. Wound #8 Left Malleolus: Return Appointment in 1 week. Home Health: Wound #10 Right,Medial Foot: [place] Visits - [facility] Nurse may visit PRN to address patient s wound care needs. FACE TO FACE ENCOUNTER: MEDICARE and MEDICAID PATIENTS: I certify that this patient is under my care and that I had a  face-to-face encounter that meets the physician face-to-face encounter requirements with this patient on this date. The encounter with the patient was in whole or in part for the following MEDICAL CONDITION: (primary reason for [place]) MEDICAL NECESSITY: I certify, that based on my findings, NURSING services are a medically necessary home health service. HOME BOUND STATUS: I certify that my clinical findings support that this patient is homebound (i.e., Due to illness or injury, pt requires aid of supportive devices such as crutches, cane, wheelchairs, walkers, the use of special transportation or the assistance of another person to leave their place of residence. There is a normal inability to leave the home and doing so requires considerable and taxing effort. Other absences are for medical reasons / religious services and are infrequent or of short duration when for other reasons). If current dressing causes regression in wound condition, may D/C ordered dressing product/s and apply Normal Saline Moist Dressing daily until next [place] / Other MD appointment. [place] of regression in wound condition at [phone]. Please direct any NON-WOUND related issues/requests for orders to patient's Primary Care Physician Wound #11 Right,Distal Calcaneus: [place] Visits - [facility] Nurse may visit PRN to address patient s wound care needs. FACE TO FACE ENCOUNTER: MEDICARE and MEDICAID PATIENTS: I certify that this patient is under Pavlovich, Shaunita H. ([id number]) my care and that I had a face-to-face encounter that meets the physician face-to-face encounter requirements with this patient on this date. The encounter with the patient was in whole or in part for the following MEDICAL CONDITION: (primary reason for [place]) MEDICAL NECESSITY: I certify, that based on my findings, NURSING services are a medically necessary home health  service. HOME BOUND  STATUS: I certify that my clinical findings support that this patient is homebound (i.e., Due to illness or injury, pt requires aid of supportive devices such as crutches, cane, wheelchairs, walkers, the use of special transportation or the assistance of another person to leave their place of residence. There is a normal inability to leave the home and doing so requires considerable and taxing effort. Other absences are for medical reasons / religious services and are infrequent or of short duration when for other reasons). If current dressing causes regression in wound condition, may D/C ordered dressing product/s and apply Normal Saline Moist Dressing daily until next [place] / Other MD appointment. [place] of regression in wound condition at [phone]. Please direct any NON-WOUND related issues/requests for orders to patient's Primary Care Physician Wound #3 Midline Back: [place] Visits - [facility] Nurse may visit PRN to address patient s wound care needs. FACE TO FACE ENCOUNTER: MEDICARE and MEDICAID PATIENTS: I certify that this patient is under my care and that I had a face-to-face encounter that meets the physician face-to-face encounter requirements with this patient on this date. The encounter with the patient was in whole or in part for the following MEDICAL CONDITION: (primary reason for [place]) MEDICAL NECESSITY: I certify, that based on my findings, NURSING services are a medically necessary home health service. HOME BOUND STATUS: I certify that my clinical findings support that this patient is homebound (i.e., Due to illness or injury, pt requires aid of supportive devices such as crutches, cane, wheelchairs, walkers, the use of special transportation or the assistance of another person to leave their place of residence. There is a normal inability to leave the home and doing so requires  considerable and taxing effort. Other absences are for medical reasons / religious services and are infrequent or of short duration when for other reasons). If current dressing causes regression in wound condition, may D/C ordered dressing product/s and apply Normal Saline Moist Dressing daily until next [place] / Other MD appointment. [place] of regression in wound condition at [phone]. Please direct any NON-WOUND related issues/requests for orders to patient's Primary Care Physician Wound #7 Right Ischial Tuberosity: [place] Visits - [facility] Nurse may visit PRN to address patient s wound care needs. FACE TO FACE ENCOUNTER: MEDICARE and MEDICAID PATIENTS: I certify that this patient is under my care and that I had a face-to-face encounter that meets the physician face-to-face encounter requirements with this patient on this date. The encounter with the patient was in whole or in part for the following MEDICAL CONDITION: (primary reason for [place]) MEDICAL NECESSITY: I certify, that based on my findings, NURSING services are a medically necessary home health service. HOME BOUND STATUS: I certify that my clinical findings support that this patient is homebound (i.e., Due to illness or injury, pt requires aid of supportive devices such as crutches, cane, wheelchairs, walkers, the use of special transportation or the assistance of another person to leave their place of residence. There is a normal inability to leave the home and doing so requires considerable and taxing effort. Other absences are for medical reasons / religious services and are infrequent or of short duration when for other reasons). If current dressing causes regression in wound condition, may D/C ordered dressing product/s and apply Normal Saline Moist Dressing daily until next [place] / Other MD appointment. [place] of  regression in wound condition at [phone]. Please direct any NON-WOUND related issues/requests for orders to patient's Primary Care Physician Wound #8 Left Malleolus: [place] Visits - [facility] Nurse may visit PRN to address patient s wound care needs. FACE TO FACE ENCOUNTER: MEDICARE and MEDICAID PATIENTS: I certify that this patient is under Hildenbrand, Danae H. ([id number]) my care and that I had a face-to-face encounter that meets the physician face-to-face encounter requirements with this patient on this date. The encounter with the patient was in whole or in part for the following MEDICAL CONDITION: (primary reason for [place]) MEDICAL NECESSITY: I certify, that based on my findings, NURSING services are a medically necessary home health service. HOME BOUND STATUS: I certify that my clinical findings support that this patient is homebound (i.e., Due to illness or injury, pt requires aid of supportive devices such as crutches, cane, wheelchairs, walkers, the use of special transportation or the assistance of another person to leave their place of residence. There is a normal inability to leave the home and doing so requires considerable and taxing effort. Other absences are for medical reasons / religious services and are infrequent or of short duration when for other reasons). If current dressing causes regression in wound condition, may D/C ordered dressing product/s and apply Normal Saline Moist Dressing daily until next [place] / Other MD appointment. [place] of regression in wound condition at [phone]. Please direct any NON-WOUND related issues/requests for orders to patient's Primary Care Physician The fungal infection still persist and we have asked her to continue with Lotrisone cream on her feet. The right hip wound and the thoracic spine wound will need central ointment and the patient does not agree to any  debridement today. all the other wounds on the feet are looking much cleaner and we will go with [employer]isma [employer]. I have also addressed nutrition and offloading in detail with them and they are going to be compliant Electronic Signature(s) Signed: 01/19/2016 2:46:46 PM By: Christin Fudge MD, FACS Previous Signature: 01/19/2016 2:46:30 PM Version By: Christin Fudge MD, FACS Previous Signature: 01/16/2016 4:31:14 PM Version By: Christin Fudge MD, FACS Entered By: Christin Fudge on 01/19/2016 14:46:46 Sorenson, Patti H. ([id number]) -------------------------------------------------------------------------------- SuperBill Details Patient Name: [name], Biannca H. Date of Service: 01/16/2016 Medical Record Number: [id number] Patient Account Number: [id number] Date of Birth/Sex: Aug 27, 1946 (70 y.o. Female) Treating RN: Montey Hora Primary Care Physician: Paulita Cradle Other Clinician: Referring Physician: Paulita Cradle Treating Physician/Extender: Frann Rider in Treatment: 10 Diagnosis Coding ICD-10 Codes Code Description 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, stage 3 F17.218 Nicotine dependence, cigarettes, with other nicotine-induced disorders L89.100 Pressure ulcer of unspecified part of back, unstageable L89.213 Pressure ulcer of right hip, stage 3 I89.0 Lymphedema, not elsewhere classified L97.211 Non-pressure chronic ulcer of right calf limited to breakdown of skin Facility Procedures CPT4 Code: [id number] Description: [id number] - WOUND CARE VISIT-LEV 5 EST PT Modifier: Quantity: 1 Physician Procedures CPT4: Description Modifier Quantity Code [id number] - WC PHYS LEVEL 3 - EST PT 1 ICD-10 Description Diagnosis 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, stage 3 L89.100 Pressure ulcer of unspecified part of  back, unstageable L89.213 Pressure ulcer of right hip, stage 3 CPT4: [place] Physician  services for initial certification of Medicare-covered home health 1 services, billable once for a patient's home health certification period. ICD-10 Description Diagnosis E11.621 Type 2 diabetes  mellitus with foot ulcer L89.613  Pressure ulcer of right heel, stage 3 L89.100 Pressure ulcer of unspecified part of back, unstageable L89.213 Pressure ulcer of right hip, stage 3 [place], Chonita H. ([id number]) Electronic Signature(s) Signed: 01/19/2016 1:14:20 PM By: Gretta Cool, RN, BSN, Kim RN, BSN Signed: 01/19/2016 4:11:53 PM By: Christin Fudge MD, FACS Previous Signature: 01/19/2016 10:42:26 AM Version By: Gretta Cool RN, BSN, Kim RN, BSN Previous Signature: 01/16/2016 4:31:46 PM Version By: Christin Fudge MD, FACS Previous Signature: 01/16/2016 4:31:33 PM Version By: Christin Fudge MD, FACS Entered By: Gretta Cool RN, BSN, Kim on 01/19/2016 12:27:25

## 2016-01-23 ENCOUNTER — Encounter: Payer: Medicare Other | Admitting: Surgery

## 2016-01-23 DIAGNOSIS — E11621 Type 2 diabetes mellitus with foot ulcer: Secondary | ICD-10-CM | POA: Diagnosis not present

## 2016-01-24 NOTE — Progress Notes (Signed)
FLIS, [name] ([id number]) Visit Report for 01/23/2016 Arrival Information Details Patient Name: [name], [name] Date of Service: 01/23/2016 1:30 PM Medical Record Number: [id number] Patient Account Number: [id number] Date of Birth/Sex: 1946-03-30 (70 y.o. Female) Treating RN: Cornell Barman Primary Care Physician: Paulita Cradle Other Clinician: Referring Physician: Paulita Cradle Treating Physician/Extender: Frann Rider in Treatment: 51 Visit Information History Since Last Visit Added or deleted any medications: No Patient Arrived: Wheel Chair Any new allergies or adverse reactions: No Arrival Time: 13:44 Had a fall or experienced change in No activities of daily living that may affect Accompanied By: dale, risk of falls: husband Signs or symptoms of abuse/neglect since last No Transfer Assistance: Manual visito Patient Identification Verified: Yes Hospitalized since last visit: No Secondary Verification Process Yes Has Dressing in Place as Prescribed: Yes Completed: Pain Present Now: No Patient Requires Transmission-Based No Precautions: Patient Has Alerts: No Electronic Signature(s) Signed: 01/23/2016 5:29:39 PM By: Gretta Cool, RN, BSN, Kim RN, BSN Entered By: Gretta Cool, RN, BSN, Kim on 01/23/2016 13:44:26 [name], [name] -------------------------------------------------------------------------------- Encounter Discharge Information Details Patient Name: [name], [name] Date of Service: 01/23/2016 1:30 PM Medical Record Number: [id number] Patient Account Number: [id number] Date of Birth/Sex: Oct 28, 1945 (70 y.o. Female) Treating RN: Cornell Barman Primary Care Physician: Paulita Cradle Other Clinician: Referring Physician: Paulita Cradle Treating Physician/Extender: Frann Rider in Treatment: 42 Encounter Discharge Information Items Discharge Pain Level: 0 Discharge Condition: Stable Ambulatory Status: Wheelchair Discharge Destination:  Home Transportation: Private Auto Accompanied By: dale Schedule Follow-up Appointment: Yes Medication Reconciliation completed and provided to Patient/Care Yes Kacper [place]: Provided on Clinical Summary of Care: 01/23/2016 Form Type Recipient Paper Patient AS Electronic Signature(s) Signed: 01/23/2016 5:29:39 PM By: Gretta Cool RN, BSN, Kim RN, BSN Previous Signature: 01/23/2016 2:27:07 PM Version By: Ruthine Dose Entered By: Gretta Cool RN, BSN, Kim on 01/23/2016 14:29:16 [name] -------------------------------------------------------------------------------- Lower Extremity Assessment Details Patient Name: [name], [name] Date of Service: 01/23/2016 1:30 PM Medical Record Number: [id number] Patient Account Number: [id number] Date of Birth/Sex: [birth year] (70 y.o. Female) Treating RN: Cornell Barman Primary Care Physician: Paulita Cradle Other Clinician: Referring Physician: Paulita Cradle Treating Physician/Extender: Frann Rider in Treatment: 24 Vascular Assessment Pulses: Posterior Tibial Dorsalis Pedis Palpable: [Left:Yes] [Right:Yes] Toe Nail Assessment Left: Right: Thick: Yes Yes Discolored: Yes Yes Deformed: No No Improper Length and Hygiene: Yes Yes Electronic Signature(s) Signed: 01/23/2016 5:29:39 PM By: Gretta Cool, RN, BSN, Kim RN, BSN Entered By: Gretta Cool, RN, BSN, Kim on 01/23/2016 13:57:26 [name], [name] -------------------------------------------------------------------------------- Multi Wound Chart Details Patient Name: [name], [name] Date of Service: 01/23/2016 1:30 PM Medical Record Number: [id number] Patient Account Number: [id number] Date of Birth/Sex: 15-Nov-1945 (70 y.o. Female) Treating RN: Cornell Barman Primary Care Physician: Paulita Cradle Other Clinician: Referring Physician: Paulita Cradle Treating Physician/Extender: Frann Rider in Treatment: 45 Vital Signs Height(in): 65 Pulse(bpm): 107 Weight(lbs):  100 Blood Pressure 138/72 (mmHg): Body Mass Index(BMI): 17 Temperature(F): 98.1 Respiratory Rate 16 (breaths/min): Photos: [10:No Photos] [11:No Photos] [3:No Photos] Wound Location: [10:Right, Medial Foot] [11:Right, Distal Calcaneus] [3:Midline Back] Wounding Event: [10:Gradually Appeared] [11:Pressure Injury] [3:Pressure Injury] 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] [11:Pressure Ulcer] [3:Pressure Ulcer] Date Acquired: [10:09/12/2015] [11:09/29/2015] [3:05/02/2015] Weeks of Treatment: [10:18] [11:16] [3:38] Wound Status: [10:Open] [11:Open] [3:Open] Measurements L x W x D 0.3x0.3x0.1 [11:0.7x0.9x0.1] [3:1.4x2.5x0.2] (cm) Area (cm) : [10:0.071] [11:0.495] [3:2.749] Volume (cm) : [10:0.007] [11:0.049] [3:0.55] % Reduction in Area: [10:69.90%] [11:30.00%] [3:-1149.50%] % Reduction in Volume: 70.80% [11:31.00%] [3:-2400.00%] Classification: [10:Category/Stage II] [11:Category/Stage II] [3:Category/Stage II] Periwound Skin  Texture: No Abnormalities Noted [11:No Abnormalities Noted] [3:No Abnormalities Noted] Periwound Skin [10:No Abnormalities Noted] [11:No Abnormalities Noted] [3:No Abnormalities Noted] Moisture: Periwound Skin Color: No Abnormalities Noted [11:No Abnormalities Noted] [3:No Abnormalities Noted] Tenderness on [10:No] [11:No] [3:No] Wound Number: 7 [id number] N/A Photos: No Photos No Photos N/A Wound Location: Right Ischial Tuberosity Left Malleolus N/A Wounding Event: Pressure Injury Gradually Appeared N/A Primary Etiology: Pressure Ulcer Pressure Ulcer N/A Date Acquired: 08/01/2015 09/12/2015 N/A Weeks of Treatment: 24 18 N/A Wound Status: Open Open N/A [name] ([id number]) Measurements L x W x D 0.6x0.5x0.4 0.1x0.1x0.1 N/A (cm) Area (cm) : 0.236 0.008 N/A Volume (cm) : 0.094 0.001 N/A % Reduction in Area: 92.50% 83.00% N/A % Reduction in Volume: 70.10% 88.90% N/A Classification: Category/Stage III Category/Stage II N/A Periwound Skin Texture: No  Abnormalities Noted No Abnormalities Noted N/A Periwound Skin No Abnormalities Noted No Abnormalities Noted N/A Moisture: Periwound Skin Color: No Abnormalities Noted No Abnormalities Noted N/A Tenderness on No No N/A Palpation: Treatment Notes Electronic Signature(s) Signed: 01/23/2016 5:29:39 PM By: Gretta Cool, RN, BSN, Kim RN, BSN Entered By: Gretta Cool, RN, BSN, Kim on 01/23/2016 14:01:30 [name], [name] ([id number]) -------------------------------------------------------------------------------- [place] Details Patient Name: [name], Jason [name]. Date of Service: 01/23/2016 1:30 PM Medical Record Number: [id number] Patient Account Number: [id number] Date of Birth/Sex: 08/07/1946 (70 y.o. Female) Treating RN: Cornell Barman Primary Care Physician: Paulita Cradle Other Clinician: Referring Physician: Paulita Cradle Treating Physician/Extender: Frann Rider in Treatment: 68 Active Inactive Abuse / Safety / Falls / Self Care Management Nursing Diagnoses: Impaired home maintenance Impaired physical mobility Knowledge deficit related to: safety; personal, health (wound), emergency Potential for falls Self care deficit: actual or potential Goals: Patient will remain injury free Date Initiated: 03/13/2015 Goal Status: Active Patient/caregiver will verbalize understanding of skin care regimen Date Initiated: 03/13/2015 Goal Status: Active Patient/caregiver will verbalize/demonstrate measure taken to improve self care Date Initiated: 03/13/2015 Goal Status: Active Patient/caregiver will verbalize/demonstrate measures taken to improve the patient's personal safety Date Initiated: 03/13/2015 Goal Status: Active Patient/caregiver will verbalize/demonstrate measures taken to prevent injury and/or falls Date Initiated: 03/13/2015 Goal Status: Active Patient/caregiver will verbalize/demonstrate understanding of what to do in case of emergency Date Initiated: 03/13/2015 Goal  Status: Active Interventions: Assess fall risk on admission and as needed Assess: immobility, friction, shearing, incontinence upon admission and as needed Assess impairment of mobility on admission and as needed per policy Assess self care needs on admission and as needed Provide education on basic hygiene [place], [place]. ([id number]) Provide education on fall prevention Provide education on personal and home safety Provide education on safe transfers Treatment Activities: Education provided on Basic Hygiene : 10/23/2015 Notes: Orientation to the Wound Care Program Nursing Diagnoses: Knowledge deficit related to the wound healing center program Goals: Patient/caregiver will verbalize understanding of the [place] Date Initiated: 03/13/2015 Goal Status: Active Interventions: Provide education on orientation to the wound center Notes: Pressure Nursing Diagnoses: Knowledge deficit related to causes and risk factors for pressure ulcer development Knowledge deficit related to management of pressures ulcers Potential for impaired tissue integrity related to pressure, friction, moisture, and shear Goals: Patient will remain free from development of additional pressure ulcers Date Initiated: 03/13/2015 Goal Status: Active Patient will remain free of pressure ulcers Date Initiated: 03/13/2015 Goal Status: Active Patient/caregiver will verbalize risk factors for pressure ulcer development Date Initiated: 03/13/2015 Goal Status: Active Patient/caregiver will verbalize understanding of pressure ulcer management Date Initiated: 03/13/2015 Goal Status: Active Interventions: Assess: immobility, friction,  shearing, incontinence upon admission and as needed [name], [name] ([id number]) Assess offloading mechanisms upon admission and as needed Assess potential for pressure ulcer upon admission and as needed Provide education on pressure ulcers Treatment Activities: Patient  referred for home evaluation of offloading devices/mattresses : 01/02/2016 Patient referred for pressure reduction/relief devices : 01/02/2016 Patient referred for seating evaluation to ensure proper offloading : 01/02/2016 Pressure reduction/relief device ordered : 01/02/2016 Test ordered outside of clinic : 01/02/2016 Notes: Wound/Skin Impairment Nursing Diagnoses: Impaired tissue integrity Knowledge deficit related to ulceration/compromised skin integrity Goals: Patient/caregiver will verbalize understanding of skin care regimen Date Initiated: 03/13/2015 Goal Status: Active Ulcer/skin breakdown will have a volume reduction of 30% by week 4 Date Initiated: 03/13/2015 Goal Status: Active Ulcer/skin breakdown will have a volume reduction of 50% by week 8 Date Initiated: 03/13/2015 Goal Status: Active Ulcer/skin breakdown will have a volume reduction of 80% by week 12 Date Initiated: 03/13/2015 Goal Status: Active Ulcer/skin breakdown will heal within 14 weeks Date Initiated: 03/13/2015 Goal Status: Active Interventions: Assess patient/caregiver ability to obtain necessary supplies Assess patient/caregiver ability to perform ulcer/skin care regimen upon admission and as needed Assess ulceration(s) every visit Provide education on smoking Provide education on ulcer and skin care Treatment Activities: Patient referred to home care : 01/02/2016 [name], [name] ([id number]) Referred to DME [facility] for dressing supplies : 01/02/2016 Skin care regimen initiated : 01/02/2016 Topical wound management initiated : 01/02/2016 Notes: Electronic Signature(s) Signed: 01/23/2016 5:29:39 PM By: Gretta Cool, RN, BSN, Kim RN, BSN Entered By: Gretta Cool, RN, BSN, Kim on 01/23/2016 14:01:09 [name], Mckenzie [place]. ([id number]) -------------------------------------------------------------------------------- Pain Assessment Details Patient Name: [name], [name] Date of Service: 01/23/2016 1:30 PM Medical Record Number:  [id number] Patient Account Number: [id number] Date of Birth/Sex: 1946-02-08 (70 y.o. Female) Treating RN: Cornell Barman Primary Care Physician: Paulita Cradle Other Clinician: Referring Physician: Paulita Cradle Treating Physician/Extender: Frann Rider in Treatment: 44 Active Problems Location of Pain Severity and Description of Pain Patient Has Paino No Site Locations Pain Management and Medication Current Pain Management: Electronic Signature(s) Signed: 01/23/2016 5:29:39 PM By: Gretta Cool, RN, BSN, Kim RN, BSN Entered By: Gretta Cool, RN, BSN, Kim on 01/23/2016 13:44:31 [name], Mckenzie HMarland Kitchen ([id number]) -------------------------------------------------------------------------------- Patient/Caregiver Education Details Patient Name: [name], Myrakle [name]. Date of Service: 01/23/2016 1:30 PM Medical Record Number: [id number] Patient Account Number: [id number] Date of Birth/Gender: 1946-03-25 (70 y.o. Female) Treating RN: Cornell Barman Primary Care Physician: Paulita Cradle Other Clinician: Referring Physician: Paulita Cradle Treating Physician/Extender: Frann Rider in Treatment: 32 Education Assessment Education Provided To: Patient Education Topics Provided Wound/Skin Impairment: Handouts: Other: continue wound care as prescribed Methods: Demonstration Responses: State content correctly Electronic Signature(s) Signed: 01/23/2016 5:29:39 PM By: Gretta Cool, RN, BSN, Kim RN, BSN Entered By: Gretta Cool, RN, BSN, Kim on 01/23/2016 14:29:45 [name], Vyla [name]. ([id number]) -------------------------------------------------------------------------------- Wound Assessment Details Patient Name: Beadle, Artasia [name]. Date of Service: 01/23/2016 1:30 PM Medical Record Number: [id number] Patient Account Number: [id number] Date of Birth/Sex: May 28, 1946 (70 y.o. Female) Treating RN: Cornell Barman Primary Care Physician: Paulita Cradle Other Clinician: Referring Physician: Paulita Cradle Treating  Physician/Extender: Frann Rider in Treatment: 45 Wound Status Wound Number: 10 Primary Etiology: Pressure Ulcer Wound Location: Right, Medial Foot Wound Status: Open Wounding Event: Gradually Appeared Date Acquired: 09/12/2015 Weeks Of Treatment: 18 Clustered Wound: No Photos Photo Uploaded By: Gretta Cool, RN, BSN, Kim on 01/23/2016 16:59:54 Wound Measurements Length: (cm) 0.3 Width: (cm) 0.3 Depth: (cm) 0.1 Area: (cm) 0.071 Volume: (cm) 0.007 % Reduction in Area: 69.9% % Reduction in  Volume: 70.8% Wound Description Classification: Category/Stage II Periwound Skin Texture Texture Color No Abnormalities Noted: No No Abnormalities Noted: No Moisture No Abnormalities Noted: No Treatment Notes Wound #10 (Right, Medial Foot) 1. Cleansed with: Brazel, Brittinee [name]. ([id number]) Clean wound with Normal Saline 3. Peri-wound Care: Antifungal cream 4. Dressing Applied: Aquacel Ag 5. Secondary Dressing Applied Bordered Foam Dressing Electronic Signature(s) Signed: 01/23/2016 5:29:39 PM By: Gretta Cool, RN, BSN, Kim RN, BSN Entered By: Gretta Cool, RN, BSN, Kim on 01/23/2016 14:00:55 Farney, Shelby [name]. ([id number]) -------------------------------------------------------------------------------- Wound Assessment Details Patient Name: Eckart, Keya [name]. Date of Service: 01/23/2016 1:30 PM Medical Record Number: [id number] Patient Account Number: [id number] Date of Birth/Sex: 15-Sep-1945 (70 y.o. Female) Treating RN: Cornell Barman Primary Care Physician: Paulita Cradle Other Clinician: Referring Physician: Paulita Cradle Treating Physician/Extender: Frann Rider in Treatment: 45 Wound Status Wound Number: 11 Primary Etiology: Pressure Ulcer Wound Location: Right, Distal Calcaneus Wound Status: Open Wounding Event: Pressure Injury Date Acquired: 09/29/2015 Weeks Of Treatment: 16 Clustered Wound: No Photos Photo Uploaded By: Gretta Cool, RN, BSN, Kim on 01/23/2016 16:59:54 Wound  Measurements Length: (cm) 0.7 Width: (cm) 0.9 Depth: (cm) 0.1 Area: (cm) 0.495 Volume: (cm) 0.049 % Reduction in Area: 30% % Reduction in Volume: 31% Wound Description Classification: Category/Stage II Periwound Skin Texture Texture Color No Abnormalities Noted: No No Abnormalities Noted: No Moisture No Abnormalities Noted: No Treatment Notes Wound #11 (Right, Distal Calcaneus) 1. Cleansed with: Wojtkiewicz, Cyanne [name]. ([id number]) Clean wound with Normal Saline 3. Peri-wound Care: Antifungal cream 4. Dressing Applied: Aquacel Ag 5. Secondary Dressing Applied Bordered Foam Dressing Electronic Signature(s) Signed: 01/23/2016 5:29:39 PM By: Gretta Cool, RN, BSN, Kim RN, BSN Entered By: Gretta Cool, RN, BSN, Kim on 01/23/2016 14:00:56 [place], [place]. ([id number]) -------------------------------------------------------------------------------- Wound Assessment Details Patient Name: Steuck, Suhayla [name]. Date of Service: 01/23/2016 1:30 PM Medical Record Number: [id number] Patient Account Number: [id number] Date of Birth/Sex: 07-24-1946 (70 y.o. Female) Treating RN: Cornell Barman Primary Care Physician: Paulita Cradle Other Clinician: Referring Physician: Paulita Cradle Treating Physician/Extender: Frann Rider in Treatment: 45 Wound Status Wound Number: 3 Primary Etiology: Pressure Ulcer Wound Location: Midline Back Wound Status: Open Wounding Event: Pressure Injury Date Acquired: 05/02/2015 Weeks Of Treatment: 38 Clustered Wound: No Photos Photo Uploaded By: Gretta Cool, RN, BSN, Kim on 01/23/2016 17:00:36 Wound Measurements Length: (cm) 1.4 Width: (cm) 2.5 Depth: (cm) 0.2 Area: (cm) 2.749 Volume: (cm) 0.55 % Reduction in Area: -1149.5% % Reduction in Volume: -2400% Wound Description Classification: Category/Stage II Periwound Skin Texture Texture Color No Abnormalities Noted: No No Abnormalities Noted: No Moisture No Abnormalities Noted: No Electronic  Signature(s) Signed: 01/23/2016 5:29:39 PM By: Gretta Cool, RN, BSN, Kim RN, BSN [place], Charisse HMarland Kitchen ([id number]) Entered By: Gretta Cool, RN, BSN, Kim on 01/23/2016 14:00:56 [place], Cortni [name]. ([id number]) -------------------------------------------------------------------------------- Wound Assessment Details Patient Name: Peralta, Kerrington [name]. Date of Service: 01/23/2016 1:30 PM Medical Record Number: [id number] Patient Account Number: [id number] Date of Birth/Sex: [birth year] (70 y.o. Female) Treating RN: Cornell Barman Primary Care Physician: Paulita Cradle Other Clinician: Referring Physician: Paulita Cradle Treating Physician/Extender: Frann Rider in Treatment: 45 Wound Status Wound Number: 7 Primary Etiology: Pressure Ulcer Wound Location: Right Ischial Tuberosity Wound Status: Open Wounding Event: Pressure Injury Date Acquired: 08/01/2015 Weeks Of Treatment: 24 Clustered Wound: No Wound Measurements Length: (cm) 0.6 Width: (cm) 0.5 Depth: (cm) 0.4 Area: (cm) 0.236 Volume: (cm) 0.094 % Reduction in Area: 92.5% % Reduction in Volume: 70.1% Wound Description Classification: Category/Stage III Periwound Skin Texture Texture Color No Abnormalities Noted: No No Abnormalities Noted: No Moisture  No Abnormalities Noted: No Electronic Signature(s) Signed: 01/23/2016 5:29:39 PM By: Gretta Cool, RN, BSN, Kim RN, BSN Entered By: Gretta Cool, RN, BSN, Kim on 01/23/2016 14:00:56 [place], [place]. ([id number]) -------------------------------------------------------------------------------- Wound Assessment Details Patient Name: Blaney, Lundynn [name]. Date of Service: 01/23/2016 1:30 PM Medical Record Number: [id number] Patient Account Number: [id number] Date of Birth/Sex: [birth year] (70 y.o. Female) Treating RN: Cornell Barman Primary Care Physician: Paulita Cradle Other Clinician: Referring Physician: Paulita Cradle Treating Physician/Extender: Frann Rider in Treatment: 45 Wound Status Wound  Number: 8 Primary Etiology: Pressure Ulcer Wound Location: Left Malleolus Wound Status: Open Wounding Event: Gradually Appeared Date Acquired: 09/12/2015 Weeks Of Treatment: 18 Clustered Wound: No Photos Photo Uploaded By: Gretta Cool, RN, BSN, Kim on 01/23/2016 17:01:53 Wound Measurements Length: (cm) 0.1 Width: (cm) 0.1 Depth: (cm) 0.1 Area: (cm) 0.008 Volume: (cm) 0.001 % Reduction in Area: 83% % Reduction in Volume: 88.9% Wound Description Classification: Category/Stage II Periwound Skin Texture Texture Color No Abnormalities Noted: No No Abnormalities Noted: No Moisture No Abnormalities Noted: No Electronic Signature(s) Signed: 01/23/2016 5:29:39 PM By: Gretta Cool, RN, BSN, Kim RN, BSN [place], Seraya HMarland Kitchen ([id number]) Entered By: Gretta Cool, RN, BSN, Kim on 01/23/2016 14:00:56 Difr[place]co, Marthann [name]. ([id number]) -------------------------------------------------------------------------------- Vitals Details Patient Name: [name], Justine [name]. Date of Service: 01/23/2016 1:30 PM Medical Record Number: [id number] Patient Account Number: [id number] Date of Birth/Sex: [birth year] (70 y.o. Female) Treating RN: Cornell Barman Primary Care Physician: Paulita Cradle Other Clinician: Referring Physician: Paulita Cradle Treating Physician/Extender: Frann Rider in Treatment: 45 Vital Signs Time Taken: 13:44 Temperature (F): 98.1 Height (in): 65 Pulse (bpm): 107 Weight (lbs): 100 Respiratory Rate (breaths/min): 16 Body Mass Index (BMI): 16.6 Blood Pressure (mmHg): 138/72 Reference Range: 80 - 120 mg / dl Electronic Signature(s) Signed: 01/23/2016 5:29:39 PM By: Gretta Cool, RN, BSN, Kim RN, BSN Entered By: Gretta Cool, RN, BSN, Kim on 01/23/2016 13:45:18

## 2016-01-24 NOTE — Progress Notes (Signed)
DADDIO, Carita H. ([id number]) Visit Report for 01/23/2016 Chief Complaint Document Details Patient Name: [name], [name] Date of Service: 01/23/2016 1:30 PM Medical Record Number: [id number] Patient Account Number: [id number] Date of Birth/Sex: 1945/10/12 (70 y.o. Female) Treating RN: Cornell Barman Primary Care Physician: Paulita Cradle Other Clinician: Referring Physician: Paulita Cradle Treating Physician/Extender: Frann Rider in Treatment: 48 Information Obtained from: Patient Chief Complaint Patient presents to the wound care center for a consult due non healing wound. Ulcers on the right elbow and the right heel for about 1 month. Electronic Signature(s) Signed: 01/23/2016 2:26:40 PM By: Christin Fudge MD, FACS Entered By: Christin Fudge on 01/23/2016 14:26:40 Mee, Wynetta H. ([id number]) -------------------------------------------------------------------------------- Debridement Details Patient Name: Mckenzie Fusi, [name] Date of Service: 01/23/2016 1:30 PM Medical Record Number: [id number] Patient Account Number: [id number] Date of Birth/Sex: May 25, 1946 (70 y.o. Female) Treating RN: Cornell Barman Primary Care Physician: Paulita Cradle Other Clinician: Referring Physician: Paulita Cradle Treating Physician/Extender: Frann Rider in Treatment: 45 Debridement Performed for Wound #3 Midline Back Assessment: Performed By: Physician Christin Fudge, MD Debridement: Debridement Pre-procedure Yes Verification/Time Out Taken: Start Time: 14:07 Pain Control: Other : lidocaine 4% Level: Skin/Subcutaneous Tissue Total Area Debrided (L x 1.4 (cm) x 2.5 (cm) = 3.5 (cm) W): Tissue and other Viable, Non-Viable, Exudate, Fibrin/Slough, Subcutaneous material debrided: Instrument: Blade, Forceps Bleeding: None Hemostasis Achieved: Silver Nitrate End Time: 14:09 Procedural Pain: 0 Post Procedural Pain: 0 Response to Treatment: Procedure was tolerated well Post  Debridement Measurements of Total Wound Length: (cm) 1.4 Stage: Category/Stage II Width: (cm) 2.5 Depth: (cm) 0.3 Volume: (cm) 0.825 Post Procedure Diagnosis Same as Pre-procedure Electronic Signature(s) Signed: 01/23/2016 2:26:20 PM By: Christin Fudge MD, FACS Signed: 01/23/2016 5:29:39 PM By: Gretta Cool RN, BSN, Kim RN, BSN Entered By: Christin Fudge on 01/23/2016 14:26:20 Bramlett, Lyna H. ([id number]) -------------------------------------------------------------------------------- Debridement Details Patient Name: [name], [name] Date of Service: 01/23/2016 1:30 PM Medical Record Number: [id number] Patient Account Number: [id number] Date of Birth/Sex: Dec 13, 1945 (70 y.o. Female) Treating RN: Cornell Barman Primary Care Physician: Paulita Cradle Other Clinician: Referring Physician: Paulita Cradle Treating Physician/Extender: Frann Rider in Treatment: 45 Debridement Performed for Wound #7 Right Ischial Tuberosity Assessment: Performed By: Physician Christin Fudge, MD Debridement: Debridement Pre-procedure Yes Verification/Time Out Taken: Start Time: 14:07 Pain Control: Other : lidocaine 4% Level: Skin/Subcutaneous Tissue Total Area Debrided (L x 0.6 (cm) x 0.5 (cm) = 0.3 (cm) W): Tissue and other Exudate, Fibrin/Slough, Subcutaneous material debrided: Instrument: Forceps Bleeding: None End Time: 14:09 Procedural Pain: 0 Post Procedural Pain: 0 Response to Treatment: Procedure was tolerated well Post Debridement Measurements of Total Wound Length: (cm) 0.6 Stage: Category/Stage III Width: (cm) 0.5 Depth: (cm) 0.5 Volume: (cm) 0.118 Post Procedure Diagnosis Same as Pre-procedure Electronic Signature(s) Signed: 01/23/2016 2:26:34 PM By: Christin Fudge MD, FACS Signed: 01/23/2016 5:29:39 PM By: Gretta Cool RN, BSN, Kim RN, BSN Entered By: Christin Fudge on 01/23/2016 14:26:34 Hutto, Aminat H.  ([id number]) -------------------------------------------------------------------------------- HPI Details Patient Name: [name], [name] Date of Service: 01/23/2016 1:30 PM Medical Record Number: [id number] Patient Account Number: [id number] Date of Birth/Sex: Jun 30, 1946 (70 y.o. Female) Treating RN: Cornell Barman Primary Care Physician: Paulita Cradle Other Clinician: Referring Physician: Paulita Cradle Treating Physician/Extender: Frann Rider in Treatment: 45 History of Present Illness Location: Ulceration on the right heel and the right elbow. Quality: Patient reports experiencing a dull pain to affected area(s). Severity: Patient states wound (s) are getting better. Duration: Patient has had the wound for < 4 weeks prior to presenting for  treatment Timing: Pain in wound is Intermittent (comes and goes Context: The wound appeared gradually over time Modifying Factors: Consults to this date include:Augmentin and Bactrim and also some heel protection with duoderm Associated Signs and Symptoms: Patient reports having difficulty standing for long periods. HPI Description: 70 year old female with history of peripheral neuropathy, history of diet controlled diabetes mellitus type 2, history of alcoholism here for wound consult sent by her PCP Dr. Sherilyn Cooter. She has pressure ulcers at her right elbow, bilateral heels. Plain films of right calcaneus without acute bony process. Patient started by PCP on Augmentin, Bactrim as per orders, DuoDerm dressings applied - reports some improvement in her ulcer since last seen. Denies fever, chills, nausea, vomiting, diarrhea. She had a right humerus fracture in the middle of May and has had no surgery and arm is in a sling. She is also been laying in the bed for quite a while. Past medical history significant for essential hypertension, osteoporosis, peripheral neuropathy, alcoholism, ataxia, personal history of breast cancer treated with surgery  chemotherapy and radiation and this was done in December 2010. she is also status post laparoscopic cholecystectomy, pilonidal cyst excision, subcutaneous port placement, partial mastectomy on the left side, skin cancer removal. 03/21/2015 -- she says overall she's been doing better and continues to smoke about 15 cigarettes a day. 03/21/2015 - her orthopedic doctor has said she may require surgery for her right humerus fracture. 04/04/2015 -- her orthopedic surgery has been scheduled for August 11. 04/18/2015 -- she is doing fine as far as her elbow and her right heel goes but she has developed some redness over prominence on her thoracic spine and wanted me to take a look at this. 05/02/2015 -- she had her surgery done and now is in a sling and support. Her back has developed a pressure injury of undetermined stage. She seems to be in better spirits. 05/16/2015 -- last week her right heel was looking great and we had healed it out, but she has not been offloading appropriately and has a deep tissue injury on the right heel again. The area on her right elbow has opened out with slough and the area in the thoracic spine is also getting worse. 05/30/2015 -- she has developed 2 new ulcerations one on her left ischial tuberosity and one on the sacral region. She is still working on getting up smoking but is also unable to take her vitamins as she says she develops a diarrhea when she takes vitamins. She has increased her intake of proteins. 06/06/2015 -- the patient's husband manages to get her a low air loss mattress with initiating pressure but Stites, Jamise H. ([id number]) did not know how to use it exactly and the patient was not happy about using it. Overall she says she's been feeling better. 07/11/2015 -- . Discussed a surgical opinion for debridement and application of a wound VAC, 2 weeks ago but the patient has been reluctant to get a surgical opinion as she wants to avoid  surgery. 07/18/2015 -- they have an appointment to see Dr. Tamala Julian this coming Wednesday. 07/29/2015 -- they saw Dr. Tamala Julian in his office and he did a debridement of the wound and removed significant amount of slough. This is in addition to the debridement I had done previously on Friday where a large amount of the eschar was removed. He did not recommend the application of wound VAC. Addendum : Dr. Thompson Caul note was reviewed via EPIC and details noted as above. 08/05/2015 -- over  the last week she has developed a another pressure injury to her right hip and has had significant discoloration of the skin and an eschar there. 08/15/2015 -- she is still smoking about a pack of cigarettes a day and does not seem to want to quit. They have not been able to talk to the vendor regarding the air mattress and I will ask them to get in touch again. She is reluctant to take vitamins and does admit her nutrition is poor. 08/22/2015 -- she has been unable to tolerate her vitamins and continues to smoke. They're working on getting a low air loss mattress and have been speaking with the vendor. 09/19/2015 -- since her last visit and was admitted to the hospital between 09/09/2015 and 09/16/2015. She was thought to have an active sepsis possibly from one of her decubitus ulcers but nothing was grown except for an MSSA from her thoracic spine region. CT of this area did not show any osteomyelitis. Been given IV antibiotics which included vancomycin and Zosyn in the hospital under the care of Dr. [name] the ID specialist she was sent home on oral Keflex 500 mg 3 times a day for 2 weeks. he will consider an MRI in the outpatient setting to completely rule out osteomyelitis of the spine. 10/03/2015 --readmitted to hospital on 09/23/2015 for general debility, lethargy and possible sepsis and workup was in progress. Seen by Dr. [name] and he would also like a workup for underlying malignancy as sheos had  previous ultrasounds of the breasts suggesting findings of concern. Workup done so far does not suggest deep bony infection. She was treated for a pneumonia and received Zosyn and vancomycin. She had been recommended Cipro 500 twice a day and doxycycline 100 mg twice a day once she was to be [place] home. The antibiotics were to be stopped on 10/07/2015. 10/17/2015 -- patient is feeling much better and has been eating better and doing her offloading as much as possible. 10/23/2015 -- she has an appointment to see Dr. [name] tomorrow but other than that has been doing as much as possible with offloading and increasing her diet. 11/07/2015 -- she has not been here to see [place] for 2 weeks and at the present time continues to try and eat better, work on off loading and is using the air mattress at night. She saw her PCP yesterday and she has put her back on a fentanyl patch. 11/21/2015 -- for some strange reason she has been sitting in a chair with her legs dependent for the last 6 days nonstop and has developed massive lower extremity edema and pedal edema with weeping and ulceration. Electronic Signature(s) Signed: 01/23/2016 2:26:51 PM By: Christin Fudge MD, FACS Entered By: Christin Fudge on 01/23/2016 14:26:50 Willmore, Dorothey H. ([id number]) -------------------------------------------------------------------------------- Physical Exam Details Patient Name: Segers, Krystie H. Date of Service: 01/23/2016 1:30 PM Medical Record Number: [id number] Patient Account Number: [id number] Date of Birth/Sex: 07-30-1946 (70 y.o. Female) Treating RN: Cornell Barman Primary Care Physician: Paulita Cradle Other Clinician: Referring Physician: Paulita Cradle Treating Physician/Extender: Frann Rider in Treatment: 45 Constitutional . Pulse regular. Respirations normal and unlabored. Afebrile. . Eyes Nonicteric. Reactive to light. Ears, Nose, Mouth, and Throat Lips, teeth, and gums WNL.Marland Kitchen Moist mucosa  without lesions. Neck supple and nontender. No palpable supraclavicular or cervical adenopathy. Normal sized without goiter. Respiratory WNL. No retractions.. Cardiovascular Pedal Pulses WNL. No clubbing, cyanosis or edema. Lymphatic No adneopathy. No adenopathy. No adenopathy. Musculoskeletal Adexa without tenderness or enlargement.. Digits  and nails w/o clubbing, cyanosis, infection, petechiae, ischemia, or inflammatory conditions.. Integumentary (Hair, Skin) No suspicious lesions. No crepitus or fluctuance. No peri-wound warmth or erythema. No masses.Marland Kitchen Psychiatric Judgement and insight Intact.. No evidence of depression, anxiety, or agitation.. Notes the fungal infection has gone down completely and her right medial leg had a bit more drainage today. The right hip wound in the thoracic spine wound have significant slough and she was agreeable about debridement today and with a forcep and 95 been able to debride out of the slough on the thoracic spine and bleeding controlled with Silver nitrate. Electronic Signature(s) Signed: 01/23/2016 2:27:41 PM By: Christin Fudge MD, FACS Entered By: Christin Fudge on 01/23/2016 14:27:41 Busser, Maesyn HMarland Kitchen ([id number]) -------------------------------------------------------------------------------- Physician Orders Details Patient Name: Mckenzie Fusi, Alzina H. Date of Service: 01/23/2016 1:30 PM Medical Record Number: [id number] Patient Account Number: [id number] Date of Birth/Sex: 17-Jun-1946 (70 y.o. Female) Treating RN: Cornell Barman Primary Care Physician: Paulita Cradle Other Clinician: Referring Physician: Paulita Cradle Treating Physician/Extender: Frann Rider in Treatment: 16 Verbal / Phone Orders: Yes Clinician: Cornell Barman Read Back and Verified: Yes Diagnosis Coding Wound Cleansing Wound #10 Right,Medial Foot o Clean wound with Normal Saline. Wound #11 Right,Distal Calcaneus o Clean wound with Normal Saline. Wound #3  Midline Back o Clean wound with Normal Saline. Wound #7 Right Ischial Tuberosity o Clean wound with Normal Saline. Wound #8 Left Malleolus o Clean wound with Normal Saline. Anesthetic Wound #10 Right,Medial Foot o Topical Lidocaine 4% cream applied to wound bed prior to debridement Wound #11 Right,Distal Calcaneus o Topical Lidocaine 4% cream applied to wound bed prior to debridement Wound #3 Midline Back o Topical Lidocaine 4% cream applied to wound bed prior to debridement Wound #7 Right Ischial Tuberosity o Topical Lidocaine 4% cream applied to wound bed prior to debridement Wound #8 Left Malleolus o Topical Lidocaine 4% cream applied to wound bed prior to debridement Skin Barriers/Peri-Wound Care o Antifungal cream - bilateral feet not on wounds Primary Wound Dressing Wound #10 Right,Medial Foot o Aquacel Ag Sandlin, Shanasia H. ([id number]) Wound #11 Right,Distal Calcaneus o Aquacel Ag Wound #3 Midline Back o Aquacel Ag - Santyl at home Wound #7 Right Ischial Tuberosity o Santyl Ointment - R ischial tuberosity also pack lightly with packing strip Wound #8 Left Malleolus o Prisma Ag Secondary Dressing Wound #10 Right,Medial Foot o Dry Gauze o Gauze and Kerlix/Conform Wound #11 Right,Distal Calcaneus o Dry Gauze o Gauze and Kerlix/Conform Wound #3 Midline Back o Boardered Foam Dressing Wound #7 Right Ischial Tuberosity o Boardered Foam Dressing Wound #8 Left Malleolus o Dry Gauze o Conform/Kerlix Dressing Change Frequency Wound #10 Right,Medial Foot o Change dressing every day. Wound #11 Right,Distal Calcaneus o Change dressing every day. Wound #3 Midline Back o Change dressing every day. Wound #7 Right Ischial Tuberosity o Change dressing every day. Wound #8 Left Malleolus o Change dressing every day. Vitullo, Alynah H. ([id number]) Follow-up Appointments Wound #10 Right,Medial Foot o Return Appointment in 1  week. Wound #11 Right,Distal Calcaneus o Return Appointment in 1 week. Wound #3 Midline Back o Return Appointment in 1 week. Wound #7 Right Ischial Tuberosity o Return Appointment in 1 week. Wound #8 Left Malleolus o Return Appointment in 1 week. Home Health Wound #10 [place] Vis[facility] - [place] Nurse may visit PRN to address patientos wound care needs. o FACE TO FACE ENCOUNTER: MEDICARE and MEDICAID PATIENTS: I certify that this patient is under my care and that  I had a face-to-face encounter that meets the physician face-to-face encounter requirements with this patient on this date. The encounter with the patient was in whole or in part for the following MEDICAL CONDITION: (primary reason for [place]) MEDICAL NECESSITY: I certify, that based on my findings, NURSING services are a medically necessary home health service. HOME BOUND STATUS: I certify that my clinical findings support that this patient is homebound (i.e., Due to illness or injury, pt requires aid of supportive devices such as crutches, cane, wheelchairs, walkers, the use of special transportation or the assistance of another person to leave their place of residence. There is a normal inability to leave the home and doing so requires considerable and taxing effort. Other absences are for medical reasons / religious services and are infrequent or of short duration when for other reasons). o If current dressing causes regression in wound condition, may D/C ordered dressing product/s and apply Normal Saline Moist Dressing daily until next [place] / Other MD appointment. [place] of regression in wound condition at [phone]. o Please direct any NON-WOUND related issues/requests for orders to patient's Primary Care Physician Wound #11 [place] Visits - [place] Nurse  may visit PRN to address patientos wound care needs. o FACE TO FACE ENCOUNTER: MEDICARE and MEDICAID PATIENTS: I certify that this patient is under my care and that I had a face-to-face encounter that meets the physician face-to-face encounter requirements with this patient on this date. The encounter with the patient was in whole or in part for the following MEDICAL CONDITION: (primary reason for [place]) MEDICAL NECESSITY: I certify, that based on my findings, NURSING services are a medically necessary home health service. HOME BOUND STATUS: I certify that my clinical findings support that this patient is homebound (i.e., Due to illness or injury, pt requires aid of supportive devices such as crutches, cane, wheelchairs, walkers, the use of special transportation or the assistance of another person to leave their place of residence. There is a Frede, Crissie H. ([id number]) normal inability to leave the home and doing so requires considerable and taxing effort. Other absences are for medical reasons / religious services and are infrequent or of short duration when for other reasons). o If current dressing causes regression in wound condition, may D/C ordered dressing product/s and apply Normal Saline Moist Dressing daily until next [place] / Other MD appointment. [place] of regression in wound condition at [phone]. o Please direct any NON-WOUND related issues/requests for orders to patient's Primary Care Physician Wound #3 Midline Back o [place] Visits - [place] Nurse may visit PRN to address patientos wound care needs. o FACE TO FACE ENCOUNTER: MEDICARE and MEDICAID PATIENTS: I certify that this patient is under my care and that I had a face-to-face encounter that meets the physician face-to-face encounter requirements with this patient on this date. The encounter with the patient was in whole or in part for  the following MEDICAL CONDITION: (primary reason for [place]) MEDICAL NECESSITY: I certify, that based on my findings, NURSING services are a medically necessary home health service. HOME BOUND STATUS: I certify that my clinical findings support that this patient is homebound (i.e., Due to illness or injury, pt requires aid of supportive devices such as crutches, cane, wheelchairs, walkers, the use of special transportation or the assistance of another person to leave their place of residence. There  is a normal inability to leave the home and doing so requires considerable and taxing effort. Other absences are for medical reasons / religious services and are infrequent or of short duration when for other reasons). o If current dressing causes regression in wound condition, may D/C ordered dressing product/s and apply Normal Saline Moist Dressing daily until next [place] / Other MD appointment. [place] of regression in wound condition at [phone]. o Please direct any NON-WOUND related issues/requests for orders to patient's Primary Care Physician Wound #7 Right Ischial [place] Visits - [place] Nurse may visit PRN to address patientos wound care needs. o FACE TO FACE ENCOUNTER: MEDICARE and MEDICAID PATIENTS: I certify that this patient is under my care and that I had a face-to-face encounter that meets the physician face-to-face encounter requirements with this patient on this date. The encounter with the patient was in whole or in part for the following MEDICAL CONDITION: (primary reason for [place]) MEDICAL NECESSITY: I certify, that based on my findings, NURSING services are a medically necessary home health service. HOME BOUND STATUS: I certify that my clinical findings support that this patient is homebound (i.e., Due to illness or injury, pt requires aid of supportive devices such as  crutches, cane, wheelchairs, walkers, the use of special transportation or the assistance of another person to leave their place of residence. There is a normal inability to leave the home and doing so requires considerable and taxing effort. Other absences are for medical reasons / religious services and are infrequent or of short duration when for other reasons). o If current dressing causes regression in wound condition, may D/C ordered dressing product/s and apply Normal Saline Moist Dressing daily until next [place] / Other MD appointment. [place] of regression in wound condition at [phone]. o Please direct any NON-WOUND related issues/requests for orders to patient's Primary Care Physician Pierson, Trinette Lemmie Evens ([id number]) Wound #8 Left [place] Visits - [place] Nurse may visit PRN to address patientos wound care needs. o FACE TO FACE ENCOUNTER: MEDICARE and MEDICAID PATIENTS: I certify that this patient is under my care and that I had a face-to-face encounter that meets the physician face-to-face encounter requirements with this patient on this date. The encounter with the patient was in whole or in part for the following MEDICAL CONDITION: (primary reason for [place]) MEDICAL NECESSITY: I certify, that based on my findings, NURSING services are a medically necessary home health service. HOME BOUND STATUS: I certify that my clinical findings support that this patient is homebound (i.e., Due to illness or injury, pt requires aid of supportive devices such as crutches, cane, wheelchairs, walkers, the use of special transportation or the assistance of another person to leave their place of residence. There is a normal inability to leave the home and doing so requires considerable and taxing effort. Other absences are for medical reasons / religious services and are infrequent or of short duration when  for other reasons). o If current dressing causes regression in wound condition, may D/C ordered dressing product/s and apply Normal Saline Moist Dressing daily until next [place] / Other MD appointment. [place] of regression in wound condition at [phone]. o Please direct any NON-WOUND related issues/requests for orders to patient's Primary Care Physician Electronic Signature(s) Signed: 01/23/2016 3:59:17 PM By: Christin Fudge MD, FACS Signed: 01/23/2016 5:29:39 PM By:  Gretta Cool, RN, BSN, Leisure centre manager, BSN Entered By: Gretta Cool, RN, BSN, Kim on 01/23/2016 14:24:04 Hernandes, Tenlee H. ([id number]) -------------------------------------------------------------------------------- Problem List Details Patient Name: Rappaport, Sameen H. Date of Service: 01/23/2016 1:30 PM Medical Record Number: [id number] Patient Account Number: [id number] Date of Birth/Sex: 02-26-1946 (70 y.o. 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 [id number]) I89.0 Lymphedema, not elsewhere classified 11/21/2015 11/21/2015 Electronic Signature(s) Signed: 01/23/2016 2:26:01 PM By: Christin Fudge MD, FACS Entered By: Christin Fudge on 01/23/2016 14:26:01 Grandberry, Kamarah H. ([id number]) -------------------------------------------------------------------------------- Progress Note Details Patient Name: Nevels, Janaia H. Date of Service: 01/23/2016 1:30 PM Medical Record Number: [id number] Patient Account Number: [id number] Date of Birth/Sex: Oct 28, 1945 (70 y.o. Female) Treating RN: Cornell Barman Primary Care Physician: Paulita Cradle Other Clinician: Referring Physician: Paulita Cradle Treating Physician/Extender: Frann Rider in Treatment: 42 Subjective Chief Complaint Information obtained from Patient Patient presents to the wound care center for a consult due non healing wound. Ulcers on the right elbow and the right heel for about 1 month. History of Present Illness (HPI) The following HPI elements were documented for the patient's wound: Location: Ulceration on the right heel and the right elbow. Quality: Patient reports experiencing a dull pain to affected area(s). Severity: Patient states wound (s) are getting better. Duration: Patient has had the wound for < 4 weeks prior to presenting for treatment Timing: Pain in wound is Intermittent (comes and goes Context: The wound appeared gradually over time Modifying Factors: Consults to this date include:Augmentin and Bactrim and also some heel protection with duoderm Associated Signs and Symptoms: Patient reports having difficulty standing for long periods. 70 year old female with history of peripheral neuropathy, history of diet controlled diabetes mellitus type 2, history of alcoholism here for wound consult sent by her PCP Dr. Sherilyn Cooter. She has pressure ulcers at her right elbow, bilateral heels. Plain films of right calcaneus without acute bony  process. Patient started by PCP on Augmentin, Bactrim as per orders, DuoDerm dressings applied - reports some improvement in her ulcer since last seen. Denies fever, chills, nausea, vomiting, diarrhea. She had a right humerus fracture in the middle of May and has had no surgery and arm is in a sling. She is also been laying in the bed for quite a while. Past medical history significant for essential hypertension, osteoporosis, peripheral neuropathy, alcoholism, ataxia, personal history of breast cancer treated with surgery chemotherapy and radiation and this was done in December 2010. she is also status post laparoscopic cholecystectomy, pilonidal cyst excision, subcutaneous port placement, partial mastectomy on the left side, skin cancer removal. 03/21/2015 -- she says overall she's been doing better and continues to smoke about 15 cigarettes a day. 03/21/2015 - her orthopedic doctor has said she  may require surgery for her right humerus fracture. 04/04/2015 -- her orthopedic surgery has been scheduled for August 11. 04/18/2015 -- she is doing fine as far as her elbow and her right heel goes but she has developed some redness over prominence on her thoracic spine and wanted me to take a look at this. Laymon, Vernida H. ([id number]) 05/02/2015 -- she had her surgery done and now is in a sling and support. Her back has developed a pressure injury of undetermined stage. She seems to be in better spirits. 05/16/2015 -- last week her right heel was looking great and we had healed it out, but she has not been offloading appropriately and has a deep tissue injury on the right heel again. The area on her right elbow has opened out with slough and the area in the thoracic spine is also getting worse. 05/30/2015 -- she has developed 2 new ulcerations one on her left ischial tuberosity and one on the sacral region. She is still working on getting up smoking but is also unable to take her vitamins as she says  she develops a diarrhea when she takes vitamins. She has increased her intake of proteins. 06/06/2015 -- the patient's husband manages to get her a low air loss mattress with initiating pressure but did not know how to use it exactly and the patient was not happy about using it. Overall she says she's been feeling better. 07/11/2015 -- . Discussed a surgical opinion for debridement and application of a wound VAC, 2 weeks ago but the patient has been reluctant to get a surgical opinion as she wants to avoid surgery. 07/18/2015 -- they have an appointment to see Dr. Tamala Julian this coming Wednesday. 07/29/2015 -- they saw Dr. Tamala Julian in his office and he did a debridement of the wound and removed significant amount of slough. This is in addition to the debridement I had done previously on Friday where a large amount of the eschar was removed. He did not recommend the application of wound VAC. Addendum : Dr. Thompson Caul note was reviewed via EPIC and details noted as above. 08/05/2015 -- over the last week she has developed a another pressure injury to her right hip and has had significant discoloration of the skin and an eschar there. 08/15/2015 -- she is still smoking about a pack of cigarettes a day and does not seem to want to quit. They have not been able to talk to the vendor regarding the air mattress and I will ask them to get in touch again. She is reluctant to take vitamins and does admit her nutrition is poor. 08/22/2015 -- she has been unable to tolerate her vitamins and continues to smoke. They're working on getting a low air loss mattress and have been speaking with the vendor. 09/19/2015 -- since her last visit and was admitted to the hospital between 09/09/2015 and 09/16/2015. She was thought to have an active sepsis possibly from one of her decubitus ulcers but nothing was grown except for an MSSA from her thoracic spine region. CT of this area did not show any osteomyelitis. Been given IV  antibiotics which included vancomycin and Zosyn in the hospital under the care of Dr. [name] the ID specialist she was sent home on oral Keflex 500 mg 3 times a day for 2 weeks. he will consider an MRI in the outpatient setting to completely rule out osteomyelitis of the spine. 10/03/2015 --readmitted to hospital on 09/23/2015 for general debility, lethargy and possible sepsis and  workup was in progress. Seen by Dr. [name] and he would also like a workup for underlying malignancy as she s had previous ultrasounds of the breasts suggesting findings of concern. Workup done so far does not suggest deep bony infection. She was treated for a pneumonia and received Zosyn and vancomycin. She had been recommended Cipro 500 twice a day and doxycycline 100 mg twice a day once she was to be [place] home. The antibiotics were to be stopped on 10/07/2015. 10/17/2015 -- patient is feeling much better and has been eating better and doing her offloading as much as possible. 10/23/2015 -- she has an appointment to see Dr. [name] tomorrow but other than that has been doing as much as possible with offloading and increasing her diet. 11/07/2015 -- she has not been here to see [place] for 2 weeks and at the present time continues to try and eat better, work on off loading and is using the air mattress at night. She saw her PCP yesterday and she has put her back on a fentanyl patch. 11/21/2015 -- for some strange reason she has been sitting in a chair with her legs dependent for the last 6 days nonstop and has developed massive lower extremity edema and pedal edema with weeping and ulceration. Shank, Dereonna H. ([id number]) Objective Constitutional Pulse regular. Respirations normal and unlabored. Afebrile. Vitals Time Taken: 1:44 PM, Height: 65 in, Weight: 100 lbs, BMI: 16.6, Temperature: 98.1 F, Pulse: 107 bpm, Respiratory Rate: 16 breaths/min, Blood Pressure: 138/72 mmHg. Eyes Nonicteric. Reactive to  light. Ears, Nose, Mouth, and Throat Lips, teeth, and gums WNL.Marland Kitchen Moist mucosa without lesions. Neck supple and nontender. No palpable supraclavicular or cervical adenopathy. Normal sized without goiter. Respiratory WNL. No retractions.. Cardiovascular Pedal Pulses WNL. No clubbing, cyanosis or edema. Lymphatic No adneopathy. No adenopathy. No adenopathy. Musculoskeletal Adexa without tenderness or enlargement.. Digits and nails w/o clubbing, cyanosis, infection, petechiae, ischemia, or inflammatory conditions.Marland Kitchen Psychiatric Judgement and insight Intact.. No evidence of depression, anxiety, or agitation.. General Notes: the fungal infection has gone down completely and her right medial leg had a bit more drainage today. The right hip wound in the thoracic spine wound have significant slough and she was agreeable about debridement today and with a forcep and 95 been able to debride out of the slough on the thoracic spine and bleeding controlled with Silver nitrate. Integumentary (Hair, Skin) No suspicious lesions. No crepitus or fluctuance. No peri-wound warmth or erythema. No masses.. Wound #10 status is Open. Original cause of wound was Gradually Appeared. The wound is located on the [place] ([id number]) Right,Medial Foot. The wound measures 0.3cm length x 0.3cm width x 0.1cm depth; 0.071cm^2 area and 0.007cm^3 volume. Wound #11 status is Open. Original cause of wound was Pressure Injury. The wound is located on the Right,Distal Calcaneus. The wound measures 0.7cm length x 0.9cm width x 0.1cm depth; 0.495cm^2 area and 0.049cm^3 volume. Wound #3 status is Open. Original cause of wound was Pressure Injury. The wound is located on the Midline Back. The wound measures 1.4cm length x 2.5cm width x 0.2cm depth; 2.749cm^2 area and 0.55cm^3 volume. Wound #7 status is Open. Original cause of wound was Pressure Injury. The wound is located on the Right Ischial Tuberosity. The wound  measures 0.6cm length x 0.5cm width x 0.4cm depth; 0.236cm^2 area and 0.094cm^3 volume. Wound #8 status is Open. Original cause of wound was Gradually Appeared. The wound is located on the Left Malleolus. The wound measures 0.1cm length x 0.1cm  width x 0.1cm depth; 0.008cm^2 area and 0.001cm^3 volume. Assessment Active Problems ICD-10 E11.621 - Type 2 diabetes mellitus with foot ulcer F17.218 - Nicotine dependence, cigarettes, with other nicotine-induced disorders L89.100 - Pressure ulcer of unspecified part of back, unstageable L89.213 - Pressure ulcer of right hip, stage 3 L97.211 - Non-pressure chronic ulcer of right calf limited to breakdown of skin Procedures Wound #3 Wound #3 is a Pressure Ulcer located on the Midline Back . There was a Skin/Subcutaneous Tissue Debridement [id number]) debridement with total area of 3.5 sq cm performed by Christin Fudge, MD. with the following instrument(s): Blade and Forceps to remove Viable and Non-Viable tissue/material including Exudate, Fibrin/Slough, and Subcutaneous after achieving pain control using Other (lidocaine 4%). A time out was conducted prior to the start of the procedure. There was no bleeding. The procedure was tolerated well with a pain level of 0 throughout and a pain level of 0 following the procedure. Post Debridement Measurements: 1.4cm length x 2.5cm width x 0.3cm depth; 0.825cm^3 volume. Post debridement Stage noted as Category/Stage II. Post procedure Diagnosis Wound #3: Same as Pre-Procedure Mccaskey, Payal H. ([id number]) Wound #7 Wound #7 is a Pressure Ulcer located on the Right Ischial Tuberosity . There was a Skin/Subcutaneous Tissue Debridement [id number]) debridement with total area of 0.3 sq cm performed by Christin Fudge, MD. with the following instrument(s): Forceps including Exudate, Fibrin/Slough, and Subcutaneous after achieving pain control using Other (lidocaine 4%). A time out was conducted prior to the start  of the procedure. There was no bleeding. The procedure was tolerated well with a pain level of 0 throughout and a pain level of 0 following the procedure. Post Debridement Measurements: 0.6cm length x 0.5cm width x 0.5cm depth; 0.118cm^3 volume. Post debridement Stage noted as Category/Stage III. Post procedure Diagnosis Wound #7: Same as Pre-Procedure Plan Wound Cleansing: Wound #10 Right,Medial Foot: Clean wound with Normal Saline. Wound #11 Right,Distal Calcaneus: Clean wound with Normal Saline. Wound #3 Midline Back: Clean wound with Normal Saline. Wound #7 Right Ischial Tuberosity: Clean wound with Normal Saline. Wound #8 Left Malleolus: Clean wound with Normal Saline. Anesthetic: Wound #10 Right,Medial Foot: Topical Lidocaine 4% cream applied to wound bed prior to debridement Wound #11 Right,Distal Calcaneus: Topical Lidocaine 4% cream applied to wound bed prior to debridement Wound #3 Midline Back: Topical Lidocaine 4% cream applied to wound bed prior to debridement Wound #7 Right Ischial Tuberosity: Topical Lidocaine 4% cream applied to wound bed prior to debridement Wound #8 Left Malleolus: Topical Lidocaine 4% cream applied to wound bed prior to debridement Skin Barriers/Peri-Wound Care: Antifungal cream - bilateral feet not on wounds Primary Wound Dressing: Wound #10 Right,Medial Foot: Aquacel Ag Wound #11 Right,Distal Calcaneus: Aquacel Ag Wound #3 Midline Back: Aquacel Ag - Santyl at home Laflam, Elaiza H. ([id number]) Wound #7 Right Ischial Tuberosity: Santyl Ointment - R ischial tuberosity also pack lightly with packing strip Wound #8 Left Malleolus: Prisma Ag Secondary Dressing: Wound #10 Right,Medial Foot: Dry Gauze Gauze and Kerlix/Conform Wound #11 Right,Distal Calcaneus: Dry Gauze Gauze and Kerlix/Conform Wound #3 Midline Back: Boardered Foam Dressing Wound #7 Right Ischial Tuberosity: Boardered Foam Dressing Wound #8 Left Malleolus: Dry  Gauze Conform/Kerlix Dressing Change Frequency: Wound #10 Right,Medial Foot: Change dressing every day. Wound #11 Right,Distal Calcaneus: Change dressing every day. Wound #3 Midline Back: Change dressing every day. Wound #7 Right Ischial Tuberosity: Change dressing every day. Wound #8 Left Malleolus: Change dressing every day. Follow-up Appointments: Wound #10 Right,Medial Foot: Return Appointment in 1 week. Wound #  11 Right,Distal Calcaneus: Return Appointment in 1 week. Wound #3 Midline Back: Return Appointment in 1 week. Wound #7 Right Ischial Tuberosity: Return Appointment in 1 week. Wound #8 Left Malleolus: Return Appointment in 1 week. Home Health: Wound #10 Right,Medial Foot: [place] Visits - [facility] Nurse may visit PRN to address patient s wound care needs. FACE TO FACE ENCOUNTER: MEDICARE and MEDICAID PATIENTS: I certify that this patient is under my care and that I had a face-to-face encounter that meets the physician face-to-face encounter requirements with this patient on this date. The encounter with the patient was in whole or in part for the following MEDICAL CONDITION: (primary reason for [place]) MEDICAL NECESSITY: I certify, that based on my findings, NURSING services are a medically necessary home health service. HOME BOUND STATUS: I certify that my clinical findings support that this patient is homebound (i.e., Due to illness or injury, pt requires aid of supportive devices such as crutches, cane, wheelchairs, walkers, the use Blass, Shemekia H. ([id number]) of special transportation or the assistance of another person to leave their place of residence. There is a normal inability to leave the home and doing so requires considerable and taxing effort. Other absences are for medical reasons / religious services and are infrequent or of short duration when for other reasons). If current dressing causes regression in wound condition,  may D/C ordered dressing product/s and apply Normal Saline Moist Dressing daily until next [place] / Other MD appointment. [place] of regression in wound condition at [phone]. Please direct any NON-WOUND related issues/requests for orders to patient's Primary Care Physician Wound #11 Right,Distal Calcaneus: [place] Visits - [facility] Nurse may visit PRN to address patient s wound care needs. FACE TO FACE ENCOUNTER: MEDICARE and MEDICAID PATIENTS: I certify that this patient is under my care and that I had a face-to-face encounter that meets the physician face-to-face encounter requirements with this patient on this date. The encounter with the patient was in whole or in part for the following MEDICAL CONDITION: (primary reason for [employer]) MEDICAL NECESSITY: I certify, that based on my findings, NURSING services are a medically necessary home health service. HOME BOUND STATUS: I certify that my clinical findings support that this patient is homebound (i.e., Due to illness or injury, pt requires aid of supportive devices such as crutches, cane, wheelchairs, walkers, the use of special transportation or the assistance of another person to leave their place of residence. There is a normal inability to leave the home and doing so requires considerable and taxing effort. Other absences are for medical reasons / religious services and are infrequent or of short duration when for other reasons). If current dressing causes regression in wound condition, may D/C ordered dressing product/s and apply Normal Saline Moist Dressing daily until next [place] / Other MD appointment. [place] of regression in wound condition at [phone]. Please direct any NON-WOUND related issues/requests for orders to patient's Primary Care Physician Wound #3 Midline Back: [place] Visits - [facility]  Nurse may visit PRN to address patient s wound care needs. FACE TO FACE ENCOUNTER: MEDICARE and MEDICAID PATIENTS: I certify that this patient is under my care and that I had a face-to-face encounter that meets the physician face-to-face encounter requirements with this patient on this date. The encounter with the patient was in whole or in part for the following MEDICAL CONDITION: (primary reason  for Home Healthcare) MEDICAL NECESSITY: I certify, that based on my findings, NURSING services are a medically necessary home health service. HOME BOUND STATUS: I certify that my clinical findings support that this patient is homebound (i.e., Due to illness or injury, pt requires aid of supportive devices such as crutches, cane, wheelchairs, walkers, the use of special transportation or the assistance of another person to leave their place of residence. There is a normal inability to leave the home and doing so requires considerable and taxing effort. Other absences are for medical reasons / religious services and are infrequent or of short duration when for other reasons). If current dressing causes regression in wound condition, may D/C ordered dressing product/s and apply Normal Saline Moist Dressing daily until next [place] / Other MD appointment. [place] of regression in wound condition at [phone]. Please direct any NON-WOUND related issues/requests for orders to patient's Primary Care Physician Wound #7 Right Ischial Tuberosity: [place] Visits - [facility] Nurse may visit PRN to address patient s wound care needs. FACE TO FACE ENCOUNTER: MEDICARE and MEDICAID PATIENTS: I certify that this patient is under my care and that I had a face-to-face encounter that meets the physician face-to-face encounter requirements with this patient on this date. The encounter with the patient was in whole or in part for the following MEDICAL CONDITION:  (primary reason for [place]) MEDICAL NECESSITY: I certify, that based on my findings, NURSING services are a medically necessary home health service. HOME BOUND STATUS: I certify that my clinical findings support that this patient is homebound (i.e., Due to illness or injury, pt requires aid of supportive devices such as crutches, cane, wheelchairs, walkers, the use Strohm, Janalyn H. ([id number]) of special transportation or the assistance of another person to leave their place of residence. There is a normal inability to leave the home and doing so requires considerable and taxing effort. Other absences are for medical reasons / religious services and are infrequent or of short duration when for other reasons). If current dressing causes regression in wound condition, may D/C ordered dressing product/s and apply Normal Saline Moist Dressing daily until next [place] / Other MD appointment. [place] of regression in wound condition at [phone]. Please direct any NON-WOUND related issues/requests for orders to patient's Primary Care Physician Wound #8 Left Malleolus: [place] Visits - [facility] Nurse may visit PRN to address patient s wound care needs. FACE TO FACE ENCOUNTER: MEDICARE and MEDICAID PATIENTS: I certify that this patient is under my care and that I had a face-to-face encounter that meets the physician face-to-face encounter requirements with this patient on this date. The encounter with the patient was in whole or in part for the following MEDICAL CONDITION: (primary reason for [place]) MEDICAL NECESSITY: I certify, that based on my findings, NURSING services are a medically necessary home health service. HOME BOUND STATUS: I certify that my clinical findings support that this patient is homebound (i.e., Due to illness or injury, pt requires aid of supportive devices such as crutches, cane, wheelchairs, walkers,  the use of special transportation or the assistance of another person to leave their place of residence. There is a normal inability to leave the home and doing so requires considerable and taxing effort. Other absences are for medical reasons / religious services and are infrequent or of short duration when for other reasons). If current dressing causes regression in wound  condition, may D/C ordered dressing product/s and apply Normal Saline Moist Dressing daily until next [place] / Other MD appointment. [place] of regression in wound condition at [phone]. Please direct any NON-WOUND related issues/requests for orders to patient's Primary Care Physician The right hip wound and the thoracic spine wound will need Santyl ointment. The patient agrees to have debridement today. All the other wounds on the feet are looking much cleaner and we will go with Silver alginate as they were a bit moist. I have also addressed nutrition and offloading in detail with them and they are going to be compliant Electronic Signature(s) Signed: 01/23/2016 2:30:26 PM By: Christin Fudge MD, FACS Entered By: Christin Fudge on 01/23/2016 14:30:26 Santizo, Nirvi H. ([id number]) -------------------------------------------------------------------------------- SuperBill Details Patient Name: Mckenzie Fusi, Pegge H. Date of Service: 01/23/2016 Medical Record Number: [id number] Patient Account Number: [id number] Date of Birth/Sex: 1946-07-10 (70 y.o. 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 limited to breakdown of skin Facility Procedures CPT4 Code Description: [id number] - DEB SUBQ TISSUE 20 SQ CM/< ICD-10 Description Diagnosis 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.100 Pressure ulcer of unspecified part of back, unstage L89.213 Pressure ulcer of right hip, stage 3  L97.211 Non-pressure chronic ulcer of right calf limited to Modifier: able breakdown of Quantity: 1 skin Physician Procedures CPT4 Code Description: [id number] - WC PHYS SUBQ TISS 20 SQ CM ICD-10 Description Diagnosis 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.100 Pressure ulcer of unspecified part of back, unstage 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

## 2016-02-06 ENCOUNTER — Ambulatory Visit: Payer: Medicare Other | Admitting: Surgery

## 2016-02-13 ENCOUNTER — Encounter: Payer: Medicare Other | Attending: Surgery | Admitting: Surgery

## 2016-02-13 DIAGNOSIS — I10 Essential (primary) hypertension: Secondary | ICD-10-CM | POA: Insufficient documentation

## 2016-02-13 DIAGNOSIS — F17218 Nicotine dependence, cigarettes, with other nicotine-induced disorders: Secondary | ICD-10-CM | POA: Insufficient documentation

## 2016-02-13 DIAGNOSIS — I890 Lymphedema, not elsewhere classified: Secondary | ICD-10-CM | POA: Insufficient documentation

## 2016-02-13 DIAGNOSIS — L97211 Non-pressure chronic ulcer of right calf limited to breakdown of skin: Secondary | ICD-10-CM | POA: Diagnosis not present

## 2016-02-13 DIAGNOSIS — Z853 Personal history of malignant neoplasm of breast: Secondary | ICD-10-CM | POA: Diagnosis not present

## 2016-02-13 DIAGNOSIS — L89100 Pressure ulcer of unspecified part of back, unstageable: Secondary | ICD-10-CM | POA: Diagnosis not present

## 2016-02-13 DIAGNOSIS — L89612 Pressure ulcer of right heel, stage 2: Secondary | ICD-10-CM | POA: Diagnosis not present

## 2016-02-13 DIAGNOSIS — E1140 Type 2 diabetes mellitus with diabetic neuropathy, unspecified: Secondary | ICD-10-CM | POA: Insufficient documentation

## 2016-02-13 DIAGNOSIS — L89213 Pressure ulcer of right hip, stage 3: Secondary | ICD-10-CM | POA: Insufficient documentation

## 2016-02-13 DIAGNOSIS — E11621 Type 2 diabetes mellitus with foot ulcer: Secondary | ICD-10-CM | POA: Insufficient documentation

## 2016-02-13 DIAGNOSIS — M810 Age-related osteoporosis without current pathological fracture: Secondary | ICD-10-CM | POA: Insufficient documentation

## 2016-02-13 DIAGNOSIS — F1021 Alcohol dependence, in remission: Secondary | ICD-10-CM | POA: Diagnosis not present

## 2016-02-20 ENCOUNTER — Ambulatory Visit: Payer: Medicare Other | Admitting: Surgery

## 2016-02-27 ENCOUNTER — Encounter: Payer: Medicare Other | Admitting: Surgery

## 2016-02-27 DIAGNOSIS — E11621 Type 2 diabetes mellitus with foot ulcer: Secondary | ICD-10-CM | POA: Diagnosis not present

## 2016-02-27 NOTE — Progress Notes (Addendum)
FILTER, [name] ([id number]) Visit Report for 02/27/2016 Chief Complaint Document Details Patient Name: [name], [name] Date of Service: 02/27/2016 2:30 PM Medical Record Number: [id number] Patient Account Number: [id number] Date of Birth/Sex: Nov 22, 1945 (70 y.o. Female) Treating RN: Cornell Barman Primary Care Physician: Paulita Cradle Other Clinician: Referring Physician: Paulita Cradle Treating Physician/Extender: Frann Rider in Treatment: 32 Information Obtained from: Patient Chief Complaint Patient presents to the wound care center for a consult due non healing wound. Ulcers on the right elbow and the right heel for about 1 month. Electronic Signature(s) Signed: 02/27/2016 3:06:42 PM By: Christin Fudge MD, FACS Entered By: Christin Fudge on 02/27/2016 15:06:41 [name], [name] ([id number]) -------------------------------------------------------------------------------- Debridement Details Patient Name: [name], [name] Date of Service: 02/27/2016 2:30 PM Medical Record Number: [id number] Patient Account Number: [id number] Date of Birth/Sex: 10-15-1945 (70 y.o. Female) Treating RN: Cornell Barman Primary Care Physician: Paulita Cradle Other Clinician: Referring Physician: Paulita Cradle Treating Physician/Extender: Frann Rider in Treatment: 50 Debridement Performed for Wound #11 Right,Distal Calcaneus Assessment: Performed By: Physician Christin Fudge, MD Debridement: Debridement Pre-procedure Yes Verification/Time Out Taken: Start Time: 15:04 Pain Control: Other : idocane 4% Level: Skin/Subcutaneous Tissue Total Area Debrided (L x 2.5 (cm) x 4.3 (cm) = 10.75 (cm) W): Tissue and other Viable, Non-Viable, Eschar, Fibrin/Slough, Subcutaneous material debrided: Instrument: Blade, Forceps Bleeding: None End Time: 15:17 Procedural Pain: 0 Post Procedural Pain: 0 Response to Treatment: Procedure was tolerated well Post Debridement Measurements of Total  Wound Length: (cm) 2.5 Stage: Category/Stage II Width: (cm) 4.3 Depth: (cm) 0.3 Volume: (cm) 2.533 Post Procedure Diagnosis Same as Pre-procedure Electronic Signature(s) Signed: 02/27/2016 3:29:42 PM By: Christin Fudge MD, FACS Signed: 02/27/2016 5:27:00 PM By: Gretta Cool RN, BSN, Kim RN, BSN Entered By: Christin Fudge on 02/27/2016 15:29:42 [name], [name] ([id number]) -------------------------------------------------------------------------------- HPI Details Patient Name: [name], [name] Date of Service: 02/27/2016 2:30 PM Medical Record Number: [id number] Patient Account Number: [id number] Date of Birth/Sex: 02-26-1946 (70 y.o. Female) Treating RN: Cornell Barman Primary Care Physician: Paulita Cradle Other Clinician: Referring Physician: Paulita Cradle Treating Physician/Extender: Frann Rider in Treatment: 50 History of Present Illness Location: Ulceration on the right heel and the right elbow. Quality: Patient reports experiencing a dull pain to affected area(s). Severity: Patient states wound (s) are getting better. Duration: Patient has had the wound for < 4 weeks prior to presenting for treatment Timing: Pain in wound is Intermittent (comes and goes Context: The wound appeared gradually over time Modifying Factors: Consults to this date include:Augmentin and Bactrim and also some heel protection with duoderm Associated Signs and Symptoms: Patient reports having difficulty standing for long periods. HPI Description: 70 year old female with history of peripheral neuropathy, history of diet controlled diabetes mellitus type 2, history of alcoholism here for wound consult sent by her PCP Dr. Sherilyn Cooter. She has pressure ulcers at her right elbow, bilateral heels. Plain films of right calcaneus without acute bony process. Patient started by PCP on Augmentin, Bactrim as per orders, DuoDerm dressings applied - reports some improvement in her ulcer since last seen. Denies fever,  chills, nausea, vomiting, diarrhea. She had a right humerus fracture in the middle of May and has had no surgery and arm is in a sling. She is also been laying in the bed for quite a while. Past medical history significant for essential hypertension, osteoporosis, peripheral neuropathy, alcoholism, ataxia, personal history of breast cancer treated with surgery chemotherapy and radiation and this was done in December 2010. she is also status post laparoscopic  cholecystectomy, pilonidal cyst excision, subcutaneous port placement, partial mastectomy on the left side, skin cancer removal. 03/21/2015 -- she says overall she's been doing better and continues to smoke about 15 cigarettes a day. 03/21/2015 - her orthopedic doctor has said she may require surgery for her right humerus fracture. 04/04/2015 -- her orthopedic surgery has been scheduled for August 11. 04/18/2015 -- she is doing fine as far as her elbow and her right heel goes but she has developed some redness over prominence on her thoracic spine and wanted me to take a look at this. 05/02/2015 -- she had her surgery done and now is in a sling and support. Her back has developed a pressure injury of undetermined stage. She seems to be in better spirits. 05/16/2015 -- last week her right heel was looking great and we had healed it out, but she has not been offloading appropriately and has a deep tissue injury on the right heel again. The area on her right elbow has opened out with slough and the area in the thoracic spine is also getting worse. 05/30/2015 -- she has developed 2 new ulcerations one on her left ischial tuberosity and one on the sacral region. She is still working on getting up smoking but is also unable to take her vitamins as she says she develops a diarrhea when she takes vitamins. She has increased her intake of proteins. 06/06/2015 -- the patient's husband manages to get her a low air loss mattress with initiating pressure  but [name], [name] ([id number]) did not know how to use it exactly and the patient was not happy about using it. Overall she says she's been feeling better. 07/11/2015 -- . Discussed a surgical opinion for debridement and application of a wound VAC, 2 weeks ago but the patient has been reluctant to get a surgical opinion as she wants to avoid surgery. 07/18/2015 -- they have an appointment to see Dr. Tamala Julian this coming Wednesday. 07/29/2015 -- they saw Dr. Tamala Julian in his office and he did a debridement of the wound and removed significant amount of slough. This is in addition to the debridement I had done previously on Friday where a large amount of the eschar was removed. He did not recommend the application of wound VAC. Addendum : Dr. Thompson Caul note was reviewed via EPIC and details noted as above. 08/05/2015 -- over the last week she has developed a another pressure injury to her right hip and has had significant discoloration of the skin and an eschar there. 08/15/2015 -- she is still smoking about a pack of cigarettes a day and does not seem to want to quit. They have not been able to talk to the vendor regarding the air mattress and I will ask them to get in touch again. She is reluctant to take vitamins and does admit her nutrition is poor. 08/22/2015 -- she has been unable to tolerate her vitamins and continues to smoke. They're working on getting a low air loss mattress and have been speaking with the vendor. 09/19/2015 -- since her last visit and was admitted to the hospital between 09/09/2015 and 09/16/2015. She was thought to have an active sepsis possibly from one of her decubitus ulcers but nothing was grown except for an MSSA from her thoracic spine region. CT of this area did not show any osteomyelitis. Been given IV antibiotics which included vancomycin and Zosyn in the hospital under the care of Dr. [name] the ID specialist she was sent home on oral  Keflex 500 mg 3 times a  day for 2 weeks. he will consider an MRI in the outpatient setting to completely rule out osteomyelitis of the spine. 10/03/2015 --readmitted to hospital on 09/23/2015 for general debility, lethargy and possible sepsis and workup was in progress. Seen by Dr. [name] and he would also like a workup for underlying malignancy as sheos had previous ultrasounds of the breasts suggesting findings of concern. Workup done so far does not suggest deep bony infection. She was treated for a pneumonia and received Zosyn and vancomycin. She had been recommended Cipro 500 twice a day and doxycycline 100 mg twice a day once she was to be [place] home. The antibiotics were to be stopped on 10/07/2015. 10/17/2015 -- patient is feeling much better and has been eating better and doing her offloading as much as possible. 10/23/2015 -- she has an appointment to see Dr. [name] tomorrow but other than that has been doing as much as possible with offloading and increasing her diet. 11/07/2015 -- she has not been here to see [place] for 2 weeks and at the present time continues to try and eat better, work on off loading and is using the air mattress at night. She saw her PCP yesterday and she has put her back on a fentanyl patch. 11/21/2015 -- for some strange reason she has been sitting in a chair with her legs dependent for the last 6 days nonstop and has developed massive lower extremity edema and pedal edema with weeping and ulceration. 02/13/2016 -- it's been 3 weeks since I saw her last and overall she's done well except for a right heel where she has had some new ulcerations. Electronic Signature(s) Signed: 02/27/2016 3:06:49 PM By: Christin Fudge MD, FACS Entered By: Christin Fudge on 02/27/2016 15:06:49 Velez, Chizara H. ([id number]) [name], [name] ([id number]) -------------------------------------------------------------------------------- Physical Exam Details Patient Name: [name], [name] Date of Service:  02/27/2016 2:30 PM Medical Record Number: [id number] Patient Account Number: [id number] Date of Birth/Sex: 1946/09/05 (70 y.o. Female) Treating RN: Cornell Barman Primary Care Physician: Paulita Cradle Other Clinician: Referring Physician: Paulita Cradle Treating Physician/Extender: Frann Rider in Treatment: 50 Constitutional . Pulse regular. Respirations normal and unlabored. Afebrile. . Eyes Nonicteric. Reactive to light. Ears, Nose, Mouth, and Throat Lips, teeth, and gums WNL.Marland Kitchen Moist mucosa without lesions. Neck supple and nontender. No palpable supraclavicular or cervical adenopathy. Normal sized without goiter. Respiratory WNL. No retractions.. Cardiovascular Pedal Pulses WNL. No clubbing, cyanosis or edema. Lymphatic No adneopathy. No adenopathy. No adenopathy. Musculoskeletal Adexa without tenderness or enlargement.. Digits and nails w/o clubbing, cyanosis, infection, petechiae, ischemia, or inflammatory conditions.. Integumentary (Hair, Skin) No suspicious lesions. No crepitus or fluctuance. No peri-wound warmth or erythema. No masses.Marland Kitchen Psychiatric Judgement and insight Intact.. No evidence of depression, anxiety, or agitation.. Notes the right heel both medially and laterally now have a stage III pressure injury and after debriding the medial part sharply with a 15 blade and forcep is coming close to bone. Thoracic spine continues to have some slough on the right lateral part and we will continue to use Santyl here. The right hip looks good and is fairly shallow. The left heel does not have any open wounds. Electronic Signature(s) Signed: 02/27/2016 3:29:09 PM By: Christin Fudge MD, FACS Entered By: Christin Fudge on 02/27/2016 15:29:09 Hoefer, Amamda HMarland Kitchen ([id number]) -------------------------------------------------------------------------------- Physician Orders Details Patient Name: [name], Klea H. Date of Service: 02/27/2016 2:30 PM Medical Record Number:  [id number] Patient Account Number: [id number] Date of Birth/Sex: May 22, 1946 (70  y.o. 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 Wound #15 Left,Medial Calcaneus o Clean wound with Normal Saline. Wound #3 Midline Back o Clean wound with Normal Saline. Wound #7 Right Ischial Tuberosity o Clean wound with Normal Saline. Anesthetic Wound #11 Right,Distal Calcaneus o Topical Lidocaine 4% cream applied to wound bed prior to debridement Skin Barriers/Peri-Wound Care Wound #11 Right,Distal Calcaneus o Antifungal cream - bilateral feet not on wounds Primary Wound Dressing Wound #7 Right Ischial Tuberosity o Aquacel Ag Wound #11 Right,Distal Calcaneus [name], [name] ([id number]) o Santyl Ointment Wound #3 Midline Back o Santyl Ointment Secondary Dressing Wound #11 Right,Distal Calcaneus o Boardered Foam Dressing o Conform/Kerlix Wound #15 Left,Medial Calcaneus o Boardered Foam Dressing o Conform/Kerlix Wound #3 Midline Back o Boardered Foam Dressing Wound #7 Right Ischial Tuberosity o Boardered Foam Dressing Dressing Change Frequency Wound #11 Right,Distal Calcaneus o Change dressing every day. Wound #3 Midline Back o Change  dressing every day. Wound #15 Left,Medial Calcaneus o Change dressing every other day. Wound #7 Right Ischial Tuberosity o Change dressing every other day. Follow-up Appointments Wound #11 Right,Distal Calcaneus o Return Appointment in 1 week. Wound #15 Left,Medial Calcaneus o Return Appointment in 1 week. Wound #3 Midline Back o Return Appointment in 1 week. Wound #7 Right Ischial Tuberosity o Return Appointment in 1 week. Home Health Wound #11 Right,Distal Calcaneus [name], [name] ([id number]) o [place] Visits - [place] Nurse may visit PRN to address patientos wound care needs. o FACE TO FACE ENCOUNTER: MEDICARE and MEDICAID PATIENTS: I certify that this patient is under my care and that I had a face-to-face encounter that meets the physician face-to-face encounter requirements with this patient on this date. The encounter with the patient was in whole or in part for the following MEDICAL CONDITION: (primary reason for [place]) MEDICAL NECESSITY: I certify, that based on my findings, NURSING services are a medically necessary home health service. HOME BOUND STATUS: I certify that my clinical findings support that this patient is homebound (i.e., Due to illness or injury, pt requires aid of supportive devices such as crutches, cane, wheelchairs, walkers, the use of special transportation or the assistance of another person to leave their place of residence. There is a normal inability to leave the home and doing so requires considerable and taxing effort. Other absences are for medical reasons / religious services and are infrequent or of short duration when for other reasons). o If current dressing causes regression in wound condition, may D/C ordered dressing product/s and apply Normal Saline Moist Dressing daily until next [place] / Other MD appointment. [place] of regression in wound  condition at [phone]. o Please direct any NON-WOUND related issues/requests for orders to patient's Primary Care Physician Wound #15 [place] Visits - [place] Nurse may visit PRN to address patientos wound care  needs. o FACE TO FACE ENCOUNTER: MEDICARE and MEDICAID PATIENTS: I certify that this patient is under my care and that I had a face-to-face encounter that meets the physician face-to-face encounter requirements with this patient on this date. The encounter with the patient was in whole or in part for the following MEDICAL CONDITION: (primary reason for [place]) MEDICAL NECESSITY: I certify, that based on my findings, NURSING services are a medically necessary home health service. HOME BOUND STATUS: I certify that my clinical findings support that this patient is homebound (i.e., Due to illness or injury, pt requires aid of supportive devices such as crutches, cane, wheelchairs, walkers, the use of special transportation or the assistance of another person to leave their place of residence. There is a normal inability to leave the home and doing so requires considerable and taxing effort. Other absences are for medical reasons / religious services and are infrequent or of short duration when for other reasons). o If current dressing causes regression in wound condition, may D/C ordered dressing product/s and apply Normal Saline Moist Dressing daily until next [place] / Other MD appointment. [place] of regression in wound condition at [phone]. o Please direct any NON-WOUND related issues/requests for orders to patient's Primary Care Physician Wound #3 Midline Back o [place] Visits - [place] Nurse may visit PRN to address patientos wound care needs. o FACE TO FACE ENCOUNTER: MEDICARE and MEDICAID PATIENTS: I certify that this patient is under  my care and that I had a face-to-face encounter that meets the physician face-to-face encounter requirements with this patient on this date. The encounter with the patient was in whole or in part for the following MEDICAL CONDITION: (primary reason for [place]) MEDICAL NECESSITY: I certify, that based on my findings, NURSING services are a medically necessary home health service. HOME BOUND STATUS: I certify that my clinical findings Gin, Alda H. ([id number]) support that this patient is homebound (i.e., Due to illness or injury, pt requires aid of supportive devices such as crutches, cane, wheelchairs, walkers, the use of special transportation or the assistance of another person to leave their place of residence. There is a normal inability to leave the home and doing so requires considerable and taxing effort. Other absences are for medical reasons / religious services and are infrequent or of short duration when for other reasons). o If current dressing causes regression in wound condition, may D/C ordered dressing product/s and apply Normal Saline Moist Dressing daily until next [place] / Other MD appointment. [place] of regression in wound condition at [phone]. o Please direct any NON-WOUND related issues/requests for orders to patient's Primary Care Physician Wound #7 Right Ischial [place] Visits - [place] Nurse may visit PRN to address patientos wound care needs. o FACE TO FACE ENCOUNTER: MEDICARE and MEDICAID PATIENTS: I certify that this patient is under my care and that I had a face-to-face encounter that meets the physician face-to-face encounter requirements with this patient on this date. The encounter with the patient was in whole or in part for the following MEDICAL CONDITION: (primary reason for [place]) MEDICAL NECESSITY: I certify, that based on my findings, NURSING  services are a medically necessary home health service. HOME BOUND STATUS: I certify that my clinical findings support that this patient is homebound (i.e., Due to illness or injury, pt requires aid of supportive devices such as  crutches, cane, wheelchairs, walkers, the use of special transportation or the assistance of another person to leave their place of residence. There is a normal inability to leave the home and doing so requires considerable and taxing effort. Other absences are for medical reasons / religious services and are infrequent or of short duration when for other reasons). o If current dressing causes regression in wound condition, may D/C ordered dressing product/s and apply Normal Saline Moist Dressing daily until next [place] / Other MD appointment. [place] of regression in wound condition at [phone]. o Please direct any NON-WOUND related issues/requests for orders to patient's Primary Care Physician Electronic Signature(s) Signed: 02/27/2016 4:58:26 PM By: Christin Fudge MD, FACS Signed: 02/27/2016 5:27:00 PM By: Gretta Cool RN, BSN, Kim RN, BSN Entered By: Gretta Cool, RN, BSN, Kim on 02/27/2016 15:29:10 [name], [name] ([id number]) -------------------------------------------------------------------------------- Problem List Details Patient Name: [name], [name] Date of Service: 02/27/2016 2:30 PM Medical Record Number: [id number] Patient Account Number: [id number] Date of Birth/Sex: 07-01-1946 (70 y.o. Female) Treating RN: Cornell Barman Primary Care Physician: Paulita Cradle Other Clinician: Referring Physician: Paulita Cradle Treating Physician/Extender: Frann Rider in Treatment: 66 Active Problems ICD-10 Encounter Code Description Active Date Diagnosis E11.621 Type 2 diabetes mellitus with foot ulcer 03/13/2015 Yes F17.218 Nicotine dependence, cigarettes, with other nicotine- 03/13/2015 Yes induced disorders L89.100  Pressure ulcer of unspecified part of back, unstageable 05/02/2015 Yes L89.213 Pressure ulcer of right hip, stage 3 08/05/2015 Yes L89.613 Pressure ulcer of right heel, stage 3 02/27/2016 Yes Inactive Problems Resolved Problems ICD-10 Code Description Active Date Resolved Date L89.613 Pressure ulcer of right heel, stage 3 03/13/2015 03/13/2015 L89.013 Pressure ulcer of right elbow, stage 3 03/13/2015 03/13/2015 [id number] Pressure ulcer of left hip, stage 2 05/30/2015 05/30/2015 L89.153 Pressure ulcer of sacral region, stage 3 05/30/2015 05/30/2015 Raschke, Mason H. [id number]) I89.0 Lymphedema, not elsewhere classified 11/21/2015 11/21/2015 L97.211 Non-pressure chronic ulcer of right calf limited to 11/21/2015 11/21/2015 breakdown of skin Electronic Signature(s) Signed: 02/27/2016 3:32:27 PM By: Christin Fudge MD, FACS Previous Signature: 02/27/2016 3:06:34 PM Version By: Christin Fudge MD, FACS Entered By: Christin Fudge on 02/27/2016 15:32:26 [name], [name] ([id number]) -------------------------------------------------------------------------------- Progress Note Details Patient Name: [name], [name] Date of Service: 02/27/2016 2:30 PM Medical Record Number: [id number] Patient Account Number: [id number] Date of Birth/Sex: 1946/06/23 (70 y.o. Female) Treating RN: Cornell Barman Primary Care Physician: Paulita Cradle Other Clinician: Referring Physician: Paulita Cradle Treating Physician/Extender: Frann Rider in Treatment: 26 Subjective Chief Complaint Information obtained from Patient Patient presents to the wound care center for a consult due non healing wound. Ulcers on the right elbow and the right heel for about 1 month. History of Present Illness (HPI) The following HPI elements were documented for the patient's wound: Location: Ulceration on the right heel and the right elbow. Quality: Patient reports experiencing a dull pain to affected area(s). Severity: Patient states wound (s)  are getting better. Duration: Patient has had the wound for < 4 weeks prior to presenting for treatment Timing: Pain in wound is Intermittent (comes and goes Context: The wound appeared gradually over time Modifying Factors: Consults to this date include:Augmentin and Bactrim and also some heel protection with duoderm Associated Signs and Symptoms: Patient reports having difficulty standing for long periods. 70 year old female with history of peripheral neuropathy, history of diet controlled diabetes mellitus type 2, history of alcoholism here for wound consult sent by her PCP Dr. Sherilyn Cooter. She has pressure ulcers at her right elbow, bilateral heels.  Plain films of right calcaneus without acute bony process. Patient started by PCP on Augmentin, Bactrim as per orders, DuoDerm dressings applied - reports some improvement in her ulcer since last seen. Denies fever, chills, nausea, vomiting, diarrhea. She had a right humerus fracture in the middle of May and has had no surgery and arm is in a sling. She is also been laying in the bed for quite a while. Past medical history significant for essential hypertension, osteoporosis, peripheral neuropathy, alcoholism, ataxia, personal history of breast cancer treated with surgery chemotherapy and radiation and this was done in December 2010. she is also status post laparoscopic cholecystectomy, pilonidal cyst excision, subcutaneous port placement, partial mastectomy on the left side, skin cancer removal. 03/21/2015 -- she says overall she's been doing better and continues to smoke about 15 cigarettes a day. 03/21/2015 - her orthopedic doctor has said she may require surgery for her right humerus fracture. 04/04/2015 -- her orthopedic surgery has been scheduled for August 11. 04/18/2015 -- she is doing fine as far as her elbow and her right heel goes but she has developed some redness over prominence on her thoracic spine and wanted me to take a look at  this. Boutwell, Amaiya H. ([id number]) 05/02/2015 -- she had her surgery done and now is in a sling and support. Her back has developed a pressure injury of undetermined stage. She seems to be in better spirits. 05/16/2015 -- last week her right heel was looking great and we had healed it out, but she has not been offloading appropriately and has a deep tissue injury on the right heel again. The area on her right elbow has opened out with slough and the area in the thoracic spine is also getting worse. 05/30/2015 -- she has developed 2 new ulcerations one on her left ischial tuberosity and one on the sacral region. She is still working on getting up smoking but is also unable to take her vitamins as she says she develops a diarrhea when she takes vitamins. She has increased her intake of proteins. 06/06/2015 -- the patient's husband manages to get her a low air loss mattress with initiating pressure but did not know how to use it exactly and the patient was not happy about using it. Overall she says she's been feeling better. 07/11/2015 -- . Discussed a surgical opinion for debridement and application of a wound VAC, 2 weeks ago but the patient has been reluctant to get a surgical opinion as she wants to avoid surgery. 07/18/2015 -- they have an appointment to see Dr. Tamala Julian this coming Wednesday. 07/29/2015 -- they saw Dr. Tamala Julian in his office and he did a debridement of the wound and removed significant amount of slough. This is in addition to the debridement I had done previously on Friday where a large amount of the eschar was removed. He did not recommend the application of wound VAC. Addendum : Dr. Thompson Caul note was reviewed via EPIC and details noted as above. 08/05/2015 -- over the last week she has developed a another pressure injury to her right hip and has had significant discoloration of the skin and an eschar there. 08/15/2015 -- she is still smoking about a pack of cigarettes a day and  does not seem to want to quit. They have not been able to talk to the vendor regarding the air mattress and I will ask them to get in touch again. She is reluctant to take vitamins and does admit her nutrition is poor. 08/22/2015 --  she has been unable to tolerate her vitamins and continues to smoke. They're working on getting a low air loss mattress and have been speaking with the vendor. 09/19/2015 -- since her last visit and was admitted to the hospital between 09/09/2015 and 09/16/2015. She was thought to have an active sepsis possibly from one of her decubitus ulcers but nothing was grown except for an MSSA from her thoracic spine region. CT of this area did not show any osteomyelitis. Been given IV antibiotics which included vancomycin and Zosyn in the hospital under the care of Dr. [name] the ID specialist she was sent home on oral Keflex 500 mg 3 times a day for 2 weeks. he will consider an MRI in the outpatient setting to completely rule out osteomyelitis of the spine. 10/03/2015 --readmitted to hospital on 09/23/2015 for general debility, lethargy and possible sepsis and workup was in progress. Seen by Dr. [name] and he would also like a workup for underlying malignancy as she s had previous ultrasounds of the breasts suggesting findings of concern. Workup done so far does not suggest deep bony infection. She was treated for a pneumonia and received Zosyn and vancomycin. She had been recommended Cipro 500 twice a day and doxycycline 100 mg twice a day once she was to be [place] home. The antibiotics were to be stopped on 10/07/2015. 10/17/2015 -- patient is feeling much better and has been eating better and doing her offloading as much as possible. 10/23/2015 -- she has an appointment to see Dr. [name] tomorrow but other than that has been doing as much as possible with offloading and increasing her diet. 11/07/2015 -- she has not been here to see [place] for 2 weeks and at the  present time continues to try and eat better, work on off loading and is using the air mattress at night. She saw her PCP yesterday and she has put her back on a fentanyl patch. 11/21/2015 -- for some strange reason she has been sitting in a chair with her legs dependent for the last 6 days nonstop and has developed massive lower extremity edema and pedal edema with weeping and ulceration. Zeiger, Nichol H. ([id number]) 02/13/2016 -- it's been 3 weeks since I saw her last and overall she's done well except for a right heel where she has had some new ulcerations. Objective Constitutional Pulse regular. Respirations normal and unlabored. Afebrile. Vitals Time Taken: 2:40 AM, Height: 65 in, Source: Stated, Weight: 100 lbs, Source: Stated, BMI: 16.6, Temperature: 98.4 F, Pulse: 112 bpm, Blood Pressure: 123/76 mmHg. Eyes Nonicteric. Reactive to light. Ears, Nose, Mouth, and Throat Lips, teeth, and gums WNL.Marland Kitchen Moist mucosa without lesions. Neck supple and nontender. No palpable supraclavicular or cervical adenopathy. Normal sized without goiter. Respiratory WNL. No retractions.. Cardiovascular Pedal Pulses WNL. No clubbing, cyanosis or edema. Lymphatic No adneopathy. No adenopathy. No adenopathy. Musculoskeletal Adexa without tenderness or enlargement.. Digits and nails w/o clubbing, cyanosis, infection, petechiae, ischemia, or inflammatory conditions.Marland Kitchen Psychiatric Judgement and insight Intact.. No evidence of depression, anxiety, or agitation.. General Notes: the right heel both medially and laterally now have a stage III pressure injury and after debriding the medial part sharply with a 15 blade and forcep is coming close to bone. Thoracic spine continues to have some slough on the right lateral part and we will continue to use Santyl here. The right hip looks good and is fairly shallow. The left heel does not have any open wounds. Integumentary (Hair, Skin) Chilson, Railynn H.  ([id number])  No suspicious lesions. No crepitus or fluctuance. No peri-wound warmth or erythema. No masses.. Wound #11 status is Open. Original cause of wound was Pressure Injury. The wound is located on the Right,Distal Calcaneus. The wound measures 2.5cm length x 4.3cm width x 0.3cm depth; 8.443cm^2 area and 2.533cm^3 volume. Wound #15 status is Open. Original cause of wound was Pressure Injury. The wound is located on the Left,Medial Calcaneus. The wound measures 0.2cm length x 0.4cm width x 0.1cm depth; 0.063cm^2 area and 0.006cm^3 volume. The wound is limited to skin breakdown. There is a small amount of drainage noted. There is no granulation within the wound bed. There is no necrotic tissue within the wound bed. The periwound skin appearance did not exhibit: Callus, Crepitus, Excoriation, Fluctuance, Friable, Induration, Localized Edema, Rash, Scarring, Dry/Scaly, Maceration, Moist, Atrophie Blanche, Cyanosis, Ecchymosis, Hemosiderin Staining, Mottled, Pallor, Rubor, Erythema. Wound #3 status is Open. Original cause of wound was Pressure Injury. The wound is located on the Midline Back. The wound measures 1.2cm length x 3.8cm width x 0.2cm depth; 3.581cm^2 area and 0.716cm^3 volume. The wound is limited to skin breakdown. There is a medium amount of serosanguineous drainage noted. The wound margin is flat and intact. There is medium (34-66%) pink granulation within the wound bed. There is a medium (34-66%) amount of necrotic tissue within the wound bed including Adherent Slough. The periwound skin appearance exhibited: Scarring, Moist. The periwound skin appearance did not exhibit: Callus, Crepitus, Excoriation, Fluctuance, Friable, Induration, Localized Edema, Rash, Dry/Scaly, Maceration, Atrophie Blanche, Cyanosis, Ecchymosis, Hemosiderin Staining, Mottled, Pallor, Rubor, Erythema. Wound #7 status is Open. Original cause of wound was Pressure Injury. The wound is located on the  Right Ischial Tuberosity. The wound measures 0.3cm length x 0.7cm width x 0.3cm depth; 0.165cm^2 area and 0.049cm^3 volume. 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 The thoracic spine also need Santyl especially on the right lateral edge. Mauriello, Zafirah H. ([id number]) The right hip continues to have a superficial ulcer and we will pack it lightly with silver alginate. We have addressed off loading, adequate protein and good control of her diabetes. Procedures Wound #11 Wound #11 is a Pressure Ulcer located on the Right,Distal Calcaneus . There was a Skin/Subcutaneous Tissue Debridement [id number]) debridement with total area of 10.75 sq cm performed by Christin Fudge, MD. with the following instrument(s): Blade and Forceps to remove Viable and Non-Viable tissue/material including Fibrin/Slough, Eschar, and Subcutaneous after achieving pain control using Other (idocane 4%). A time out was conducted prior to the start of the procedure. There was no bleeding. The procedure was tolerated well with a pain level of 0 throughout and a pain level of 0 following the procedure. Post Debridement Measurements: 2.5cm length x 4.3cm width x 0.3cm depth; 2.533cm^3 volume. Post debridement Stage noted as Category/Stage II. Post procedure Diagnosis Wound #11: Same as Pre-Procedure Plan Wound Cleansing: Wound #11 Right,Distal Calcaneus: Clean wound with Normal Saline. Wound #15 Left,Medial Calcaneus: Clean wound with Normal Saline. Wound #3 Midline Back: Clean wound with Normal  Saline. Wound #7 Right Ischial Tuberosity: Clean wound with Normal Saline. Anesthetic: Wound #11 Right,Distal  Calcaneus: Topical Lidocaine 4% cream applied to wound bed prior to debridement Skin Barriers/Peri-Wound Care: Wound #11 Right,Distal Calcaneus: Antifungal cream - bilateral feet not on wounds Primary Wound Dressing: Wound #7 Right Ischial Tuberosity: Aquacel Ag Wound #11 Right,Distal Calcaneus: Santyl Ointment Wound #3 Midline Back: Santyl Ointment Secondary Dressing: Wound #11 Right,Distal Calcaneus: Boardered Foam Dressing Strandberg, Jazzlin H. ([id number]) Conform/Kerlix Wound #15 Left,Medial Calcaneus: Boardered Foam Dressing Conform/Kerlix Wound #3 Midline Back: Boardered Foam Dressing Wound #7 Right Ischial Tuberosity: Boardered Foam Dressing Dressing Change Frequency: Wound #11 Right,Distal Calcaneus: Change dressing every day. Wound #3 Midline Back: Change dressing every day. Wound #15 Left,Medial Calcaneus: Change dressing every other day. Wound #7 Right Ischial Tuberosity: Change dressing every other day. Follow-up Appointments: Wound #11 Right,Distal Calcaneus: Return Appointment in 1 week. Wound #15 Left,Medial Calcaneus: Return Appointment in 1 week. Wound #3 Midline Back: Return Appointment in 1 week. Wound #7 Right Ischial Tuberosity: Return Appointment in 1 week. Home Health: Wound #11 Right,Distal Calcaneus: Continue Home Health Visits - [facility] Nurse may visit PRN to address patient s wound care needs. FACE TO FACE ENCOUNTER: MEDICARE and MEDICAID PATIENTS: I certify that this patient is under my care and that I had a face-to-face encounter that meets the physician face-to-face encounter requirements with this patient on this date. The encounter with the patient was in whole or in part for the following MEDICAL CONDITION: (primary reason for [place]) MEDICAL NECESSITY: I certify, that based on my findings, NURSING services  are a medically necessary home health service. HOME BOUND STATUS: I certify that my clinical findings support that this patient is homebound (i.e., Due to illness or injury, pt requires aid of supportive devices such as crutches, cane, wheelchairs, walkers, the use of special transportation or the assistance of another person to leave their place of residence. There is a normal inability to leave the home and doing so requires considerable and taxing effort. Other absences are for medical reasons / religious services and are infrequent or of short duration when for other reasons). If current dressing causes regression in wound condition, may D/C ordered dressing product/s and apply Normal Saline Moist Dressing daily until next [place] / Other MD appointment. [place] of regression in wound condition at [phone]. Please direct any NON-WOUND related issues/requests for orders to patient's Primary Care Physician Wound #15 Left,Medial Calcaneus: [place] Visits - [facility] Nurse may visit PRN to address patient s wound care needs. FACE TO FACE ENCOUNTER: MEDICARE and MEDICAID PATIENTS: I certify that this patient is under my care and that I had a face-to-face encounter that meets the physician face-to-face encounter requirements with this patient on this date. The encounter with the patient was in whole or in part for the following MEDICAL CONDITION: (primary reason for [place]) MEDICAL NECESSITY: I certify, Orsak, Marvine H. ([id number]) that based on my findings, NURSING services are a medically necessary home health service. HOME BOUND STATUS: I certify that my clinical findings support that this patient is homebound (i.e., Due to illness or injury, pt requires aid of supportive devices such as crutches, cane, wheelchairs, walkers, the use of special transportation or the assistance of another person to leave their place of residence.  There is a normal inability to leave the home and doing so requires considerable and taxing effort. Other absences are for medical reasons / religious services and are infrequent or of short duration when for other reasons). If current dressing causes  regression in wound condition, may D/C ordered dressing product/s and apply Normal Saline Moist Dressing daily until next [place] / Other MD appointment. [place] of regression in wound condition at [phone]. Please direct any NON-WOUND related issues/requests for orders to patient's Primary Care Physician Wound #3 Midline Back: [place] Visits - [facility] Nurse may visit PRN to address patient s wound care needs. FACE TO FACE ENCOUNTER: MEDICARE and MEDICAID PATIENTS: I certify that this patient is under my care and that I had a face-to-face encounter that meets the physician face-to-face encounter requirements with this patient on this date. The encounter with the patient was in whole or in part for the following MEDICAL CONDITION: (primary reason for [place]) MEDICAL NECESSITY: I certify, that based on my findings, NURSING services are a medically necessary home health service. HOME BOUND STATUS: I certify that my clinical findings support that this patient is homebound (i.e., Due to illness or injury, pt requires aid of supportive devices such as crutches, cane, wheelchairs, walkers, the use of special transportation or the assistance of another person to leave their place of residence. There is a normal inability to leave the home and doing so requires considerable and taxing effort. Other absences are for medical reasons / religious services and are infrequent or of short duration when for other reasons). If current dressing causes regression in wound condition, may D/C ordered dressing product/s and apply Normal Saline Moist Dressing daily until next [place] /  Other MD appointment. [place] of regression in wound condition at [phone]. Please direct any NON-WOUND related issues/requests for orders to patient's Primary Care Physician Wound #7 Right Ischial Tuberosity: [place] Visits - [facility] Nurse may visit PRN to address patient s wound care needs. FACE TO FACE ENCOUNTER: MEDICARE and MEDICAID PATIENTS: I certify that this patient is under my care and that I had a face-to-face encounter that meets the physician face-to-face encounter requirements with this patient on this date. The encounter with the patient was in whole or in part for the following MEDICAL CONDITION: (primary reason for [place]) MEDICAL NECESSITY: I certify, that based on my findings, NURSING services are a medically necessary home health service. HOME BOUND STATUS: I certify that my clinical findings support that this patient is homebound (i.e., Due to illness or injury, pt requires aid of supportive devices such as crutches, cane, wheelchairs, walkers, the use of special transportation or the assistance of another person to leave their place of residence. There is a normal inability to leave the home and doing so requires considerable and taxing effort. Other absences are for medical reasons / religious services and are infrequent or of short duration when for other reasons). If current dressing causes regression in wound condition, may D/C ordered dressing product/s and apply Normal Saline Moist Dressing daily until next [place] / Other MD appointment. [place] of regression in wound condition at [phone]. Please direct any NON-WOUND related issues/requests for orders to patient's Primary Care Physician Dildine, Jeyli H. ([id number]) The right heel has 2 areas where there are stage III pressure injuries and after debriding it we will continue to use Santyl on these wounds. The thoracic spine  also need Santyl especially on the right lateral edge. The right hip continues to have a superficial ulcer and we will pack it lightly with silver alginate. We have addressed off loading, adequate protein and good control  of her diabetes. Electronic Signature(s) Signed: 02/27/2016 4:57:47 PM By: Christin Fudge MD, FACS Previous Signature: 02/27/2016 3:30:47 PM Version By: Christin Fudge MD, FACS Entered By: Christin Fudge on 02/27/2016 16:57:47 [place], Shefali H. ([id number]) -------------------------------------------------------------------------------- SuperBill Details Patient Name: [name], Serenidy H. Date of Service: 02/27/2016 Medical Record Number: [id number] Patient Account Number: [id number] Date of Birth/Sex: 08-11-1946 (70 y.o. 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, stage 3 L89.613 Pressure ulcer of right heel, stage 3 Facility Procedures CPT4 Code: [id number] Description: [id number] - DEB SUBQ TISSUE 20 SQ CM/< ICD-10 Description Diagnosis 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, stage 3 L89.100 Pressure ulcer of unspecified part of back, uns Modifier: tageable Quantity: 1 Physician Procedures CPT4 Code: [id number] Description: [id number] - WC PHYS LEVEL 3 - EST PT ICD-10 Description Diagnosis 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.100 Pressure ulcer of unspecified part of back, uns L89.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 Description: 11042 - WC PHYS SUBQ TISS 20 SQ CM ICD-10 Description Diagnosis 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, stage 3 L89.100 Pressure ulcer of unspecified part of back, uns ([id number]) Modifier: tageable Quantity: 1 Electronic Signature(s) Signed: 02/27/2016 3:33:07 PM By: Christin Fudge MD, FACS Entered By: Christin Fudge on 02/27/2016 15:33:07

## 2016-03-01 NOTE — Progress Notes (Signed)
SERFASS, Kimerly H. ([id number]) Visit Report for 02/27/2016 Arrival Information Details Patient Name: [name], [name] Date of Service: 02/27/2016 2:30 PM Medical Record Number: [id number] Patient Account Number: [id number] Date of Birth/Sex: 1946/05/14 (70 y.o. Female) Treating RN: Cornell Barman Primary Care Physician: Paulita Cradle Other Clinician: Referring Physician: Paulita Cradle Treating Physician/Extender: Frann Rider in Treatment: 44 Visit Information History Since Last Visit Has Dressing in Place as Prescribed: Yes Patient Arrived: Walker Pain Present Now: Yes Arrival Time: 14:41 Accompanied By: Husband Transfer Assistance: Manual Patient Requires Transmission-Based No Precautions: Patient Has Alerts: No Electronic Signature(s) Signed: 03/01/2016 8:34:12 AM By: Jeri Cos PA-C Entered By: Jeri Cos on 02/27/2016 14:44:59 [name], [name] ([id number]) -------------------------------------------------------------------------------- Encounter Discharge Information Details Patient Name: [name], [name] Date of Service: 02/27/2016 2:30 PM Medical Record Number: [id number] Patient Account Number: [id number] Date of Birth/Sex: 28-Feb-1946 (70 y.o. Female) Treating RN: Cornell Barman Primary Care Physician: Paulita Cradle Other Clinician: Referring Physician: Paulita Cradle Treating Physician/Extender: Frann Rider in Treatment: 2 Encounter Discharge Information Items Schedule Follow-up Appointment: No Medication Reconciliation completed No and provided to Patient/Care Katharina Jehle: Provided on Clinical Summary of Care: 02/27/2016 Form Type Recipient Paper Patient AS Electronic Signature(s) Signed: 02/27/2016 3:39:40 PM By: Ruthine Dose Entered By: Ruthine Dose on 02/27/2016 15:39:40 [name], [name] ([id number]) -------------------------------------------------------------------------------- Multi Wound Chart Details Patient Name: [name], [name] Date  of Service: 02/27/2016 2:30 PM Medical Record Number: [id number] Patient Account Number: [id number] Date of Birth/Sex: 07-Dec-1945 (70 y.o. Female) Treating RN: Cornell Barman Primary Care Physician: Paulita Cradle Other Clinician: Referring Physician: Paulita Cradle Treating Physician/Extender: Frann Rider in Treatment: 50 Vital Signs Height(in): 65 Pulse(bpm): 112 Weight(lbs): 100 Blood Pressure 123/76 (mmHg): Body Mass Index(BMI): 17 Temperature(F): 98.4 Respiratory Rate (breaths/min): Photos: [3:No Photos] [N/A:N/A] Wound Location: Left Calcaneus - Medial Back - Midline N/A Wounding Event: Pressure Injury Pressure Injury N/A Primary Etiology: Pressure Ulcer Pressure Ulcer N/A Comorbid History: Cataracts, Asthma, Cataracts, Asthma, N/A Hypertension, Type II Hypertension, Type II Diabetes, Neuropathy, Diabetes, Neuropathy, Received Chemotherapy Received Chemotherapy Date Acquired: 02/18/2016 05/02/2015 N/A Weeks of Treatment: 0 43 N/A Wound Status: Open Open N/A Measurements L x W x D 0.2x0.4x0.1 1.2x3.8x0.2 N/A (cm) Area (cm) : 0.063 3.581 N/A Volume (cm) : 0.006 0.716 N/A % Reduction in Area: N/A -1527.70% N/A % Reduction in Volume: N/A -3154.50% N/A Classification: Category/Stage I Category/Stage II N/A HBO Classification: Grade 1 N/A N/A Exudate Amount: Small Medium N/A Exudate Type: N/A Serosanguineous N/A Exudate Color: N/A red, brown N/A Wound Margin: N/A Flat and Intact N/A Granulation Amount: None Present (0%) Medium (34-66%) N/A Elicker, Towanna H. ([id number]) Granulation Quality: N/A Pink N/A Necrotic Amount: None Present (0%) Medium (34-66%) N/A Exposed Structures: Fascia: No Fascia: No N/A Fat: No Fat: No Tendon: No Tendon: No Muscle: No Muscle: No Joint: No Joint: No Bone: No Bone: No Limited to Skin Limited to Skin Breakdown Breakdown Periwound Skin Texture: Edema: No Scarring: Yes N/A Excoriation: No Edema: No Induration:  No Excoriation: No Callus: No Induration: No Crepitus: No Callus: No Fluctuance: No Crepitus: No Friable: No Fluctuance: No Rash: No Friable: No Scarring: No Rash: No Periwound Skin Maceration: No Moist: Yes N/A Moisture: Moist: No Maceration: No Dry/Scaly: No Dry/Scaly: No Periwound Skin Color: Atrophie Blanche: No Atrophie Blanche: No N/A Cyanosis: No Cyanosis: No Ecchymosis: No Ecchymosis: No Erythema: No Erythema: No Hemosiderin Staining: No Hemosiderin Staining: No Mottled: No Mottled: No Pallor: No Pallor: No Rubor: No Rubor: No Tenderness on No No N/A Palpation: Wound Preparation:  Ulcer Cleansing: Ulcer Cleansing: N/A Rinsed/Irrigated with Rinsed/Irrigated with Saline Saline Topical Anesthetic Topical Anesthetic Applied: None Applied: Other: lidociane 4% Treatment Notes Electronic Signature(s) Signed: 02/27/2016 5:27:00 PM By: Gretta Cool, RN, BSN, Kim RN, BSN Entered By: Gretta Cool, RN, BSN, Kim on 02/27/2016 15:14:57 [name], [name] ([id number]) -------------------------------------------------------------------------------- Multi-Disciplinary Care Plan Details Patient Name: [name], Shandy H. Date of Service: 02/27/2016 2:30 PM Medical Record Number: [id number] Patient Account Number: [id number] Date of Birth/Sex: 1945-10-01 (70 y.o. Female) Treating RN: Cornell Barman Primary Care Physician: Paulita Cradle Other Clinician: Referring Physician: Paulita Cradle Treating Physician/Extender: Frann Rider in Treatment: 59 Active Inactive Abuse / Safety / Falls / Self Care Management Nursing Diagnoses: Impaired home maintenance Impaired physical mobility Knowledge deficit related to: safety; personal, health (wound), emergency Potential for falls Self care deficit: actual or potential Goals: Patient will remain injury free Date Initiated: 03/13/2015 Goal Status: Active Patient/caregiver will verbalize understanding of skin care regimen Date  Initiated: 03/13/2015 Goal Status: Active Patient/caregiver will verbalize/demonstrate measure taken to improve self care Date Initiated: 03/13/2015 Goal Status: Active Patient/caregiver will verbalize/demonstrate measures taken to improve the patient's personal safety Date Initiated: 03/13/2015 Goal Status: Active Patient/caregiver will verbalize/demonstrate measures taken to prevent injury and/or falls Date Initiated: 03/13/2015 Goal Status: Active Patient/caregiver will verbalize/demonstrate understanding of what to do in case of emergency Date Initiated: 03/13/2015 Goal Status: Active Interventions: Assess fall risk on admission and as needed Assess: immobility, friction, shearing, incontinence upon admission and as needed Assess impairment of mobility on admission and as needed per policy Assess self care needs on admission and as needed Provide education on basic hygiene [place], [place]. ([id number]) Provide education on fall prevention Provide education on personal and home safety Provide education on safe transfers Treatment Activities: Education provided on Basic Hygiene : 10/23/2015 Notes: Orientation to the Wound Care Program Nursing Diagnoses: Knowledge deficit related to the wound healing center program Goals: Patient/caregiver will verbalize understanding of the [facility] Date Initiated: 03/13/2015 Goal Status: Active Interventions: Provide education on orientation to the wound center Notes: Pressure Nursing Diagnoses: Knowledge deficit related to causes and risk factors for pressure ulcer development Knowledge deficit related to management of pressures ulcers Potential for impaired tissue integrity related to pressure, friction, moisture, and shear Goals: Patient will remain free from development of additional pressure ulcers Date Initiated: 03/13/2015 Goal Status: Active Patient will remain free of pressure ulcers Date Initiated: 03/13/2015 Goal  Status: Active Patient/caregiver will verbalize risk factors for pressure ulcer development Date Initiated: 03/13/2015 Goal Status: Active Patient/caregiver will verbalize understanding of pressure ulcer management Date Initiated: 03/13/2015 Goal Status: Active Interventions: Assess: immobility, friction, shearing, incontinence upon admission and as needed Mochizuki, Caty H. ([id number]) Assess offloading mechanisms upon admission and as needed Assess potential for pressure ulcer upon admission and as needed Provide education on pressure ulcers Treatment Activities: Patient referred for home evaluation of offloading devices/mattresses : 01/02/2016 Patient referred for pressure reduction/relief devices : 01/02/2016 Patient referred for seating evaluation to ensure proper offloading : 01/02/2016 Pressure reduction/relief device ordered : 01/02/2016 Test ordered outside of clinic : 01/02/2016 Notes: Wound/Skin Impairment Nursing Diagnoses: Impaired tissue integrity Knowledge deficit related to ulceration/compromised skin integrity Goals: Patient/caregiver will verbalize understanding of skin care regimen Date Initiated: 03/13/2015 Goal Status: Active Ulcer/skin breakdown will have a volume reduction of 30% by week 4 Date Initiated: 03/13/2015 Goal Status: Active Ulcer/skin breakdown will have a volume reduction of 50% by week 8 Date Initiated: 03/13/2015 Goal Status: Active Ulcer/skin breakdown will have a  volume reduction of 80% by week 12 Date Initiated: 03/13/2015 Goal Status: Active Ulcer/skin breakdown will heal within 14 weeks Date Initiated: 03/13/2015 Goal Status: Active Interventions: Assess patient/caregiver ability to obtain necessary supplies Assess patient/caregiver ability to perform ulcer/skin care regimen upon admission and as needed Assess ulceration(s) every visit Provide education on smoking Provide education on ulcer and skin care Treatment Activities: Patient referred  to home care : 01/02/2016 JASKOLKA, Adrielle H. ([id number]) Referred to DME Maryon Kemnitz for dressing supplies : 01/02/2016 Skin care regimen initiated : 01/02/2016 Topical wound management initiated : 01/02/2016 Notes: Electronic Signature(s) Signed: 02/27/2016 5:27:00 PM By: Gretta Cool, RN, BSN, Kim RN, BSN Entered By: Gretta Cool, RN, BSN, Kim on 02/27/2016 15:14:46 [name], [name] ([id number]) -------------------------------------------------------------------------------- Pain Assessment Details Patient Name: [name], [name] Date of Service: 02/27/2016 2:30 PM Medical Record Number: [id number] Patient Account Number: [id number] Date of Birth/Sex: 1946/03/24 (70 y.o. Female) Treating RN: Cornell Barman Primary Care Physician: Paulita Cradle Other Clinician: Referring Physician: Paulita Cradle Treating Physician/Extender: Frann Rider in Treatment: 50 Active Problems Location of Pain Severity and Description of Pain Patient Has Paino Yes Site Locations Pain Location: Pain in Ulcers Rate the pain. Current Pain Level: 8 Worst Pain Level: 10 Character of Pain Describe the Pain: Burning Pain Management and Medication Current Pain Management: Electronic Signature(s) Signed: 02/27/2016 5:27:00 PM By: Gretta Cool RN, BSN, Kim RN, BSN Signed: 03/01/2016 8:34:12 AM By: Jeri Cos PA-C Entered By: Jeri Cos on 02/27/2016 14:49:18 [name], [name] ([id number]) -------------------------------------------------------------------------------- Wound Assessment Details Patient Name: [name], [name] Date of Service: 02/27/2016 2:30 PM Medical Record Number: [id number] Patient Account Number: [id number] Date of Birth/Sex: 11-29-1945 (70 y.o. Female) Treating RN: Cornell Barman Primary Care Physician: Paulita Cradle Other Clinician: Referring Physician: Paulita Cradle Treating Physician/Extender: Frann Rider in Treatment: 50 Wound Status Wound Number: 11 Primary Etiology: Pressure Ulcer Wound  Location: Right, Distal Calcaneus Wound Status: Open Wounding Event: Pressure Injury Date Acquired: 09/29/2015 Weeks Of Treatment: 21 Clustered Wound: Yes Photos Photo Uploaded By: Gretta Cool, RN, BSN, Kim on 02/27/2016 17:25:35 Wound Measurements Length: (cm) 1.8 Width: (cm) 4 Depth: (cm) 0.3 Area: (cm) 5.655 Volume: (cm) 1.696 % Reduction in Area: -699.9% % Reduction in Volume: -2288.7% Wound Description Classification: Category/Stage II Periwound Skin Texture Texture Color No Abnormalities Noted: No No Abnormalities Noted: No Moisture No Abnormalities Noted: No Treatment Notes Wound #11 (Right, Distal Calcaneus) 1. Cleansed with: Sarti, Marieli H. ([id number]) Clean wound with Normal Saline 2. Anesthetic Topical Lidocaine 4% cream to wound bed prior to debridement 4. Dressing Applied: Santyl Ointment 5. Secondary Dressing Applied Bordered Foam Dressing Electronic Signature(s) Signed: 02/27/2016 5:27:00 PM By: Gretta Cool, RN, BSN, Kim RN, BSN Entered By: Gretta Cool, RN, BSN, Kim on 02/27/2016 15:30:39 Sookdeo, Timberlyn H. ([id number]) -------------------------------------------------------------------------------- Wound Assessment Details Patient Name: [name], [name] H. Date of Service: 02/27/2016 2:30 PM Medical Record Number: [id number] Patient Account Number: [id number] Date of Birth/Sex: 11-09-1945 (70 y.o. Female) Treating RN: Cornell Barman Primary Care Physician: Paulita Cradle Other Clinician: Referring Physician: Paulita Cradle Treating Physician/Extender: Frann Rider in Treatment: 50 Wound Status Wound Number: 15 Primary Pressure Ulcer Etiology: Wound Location: Left Calcaneus - Medial Wound Open Wounding Event: Pressure Injury Status: Date Acquired: 02/18/2016 Comorbid Cataracts, Asthma, Hypertension, Type Weeks Of Treatment: 0 History: II Diabetes, Neuropathy, Received Clustered Wound: No Chemotherapy Photos Photo Uploaded By: Gretta Cool, RN, BSN, Kim on  02/27/2016 15:07:35 Wound Measurements Length: (cm) 0.2 % Reduction Width: (cm) 0.4 % Reduction Depth: (cm) 0.1 Area: (cm) 0.063 Volume: (cm) 0.006 in  Area: in Volume: Wound Description Classification: Category/Stage I Diabetic Severity Earleen Newport): Grade 1 Exudate Amount: Small Wound Bed Granulation Amount: None Present (0%) Exposed Structure Necrotic Amount: None Present (0%) Fascia Exposed: No Fat Layer Exposed: No Tendon Exposed: No Muscle Exposed: No Joint Exposed: No Coulibaly, Billiejean H. ([id number]) Bone Exposed: No Limited to Skin Breakdown Periwound Skin Texture Texture Color No Abnormalities Noted: No No Abnormalities Noted: No Callus: No Atrophie Blanche: No Crepitus: No Cyanosis: No Excoriation: No Ecchymosis: No Fluctuance: No Erythema: No Friable: No Hemosiderin Staining: No Induration: No Mottled: No Localized Edema: No Pallor: No Rash: No Rubor: No Scarring: No Moisture No Abnormalities Noted: No Dry / Scaly: No Maceration: No Moist: No Wound Preparation Ulcer Cleansing: Rinsed/Irrigated with Saline Topical Anesthetic Applied: None Electronic Signature(s) Signed: 02/27/2016 2:53:43 PM By: Jeri Cos PA-C Signed: 02/27/2016 5:27:00 PM By: Gretta Cool RN, BSN, Kim RN, BSN Entered By: Jeri Cos on 02/27/2016 14:53:43 Hoogland, Kimberlyn H. ([id number]) -------------------------------------------------------------------------------- Wound Assessment Details Patient Name: Gohlke, Cory H. Date of Service: 02/27/2016 2:30 PM Medical Record Number: [id number] Patient Account Number: [id number] Date of Birth/Sex: 06/25/1946 (70 y.o. Female) Treating RN: Cornell Barman Primary Care Physician: Paulita Cradle Other Clinician: Referring Physician: Paulita Cradle Treating Physician/Extender: Frann Rider in Treatment: 50 Wound Status Wound Number: 3 Primary Pressure Ulcer Etiology: Wound Location: Back - Midline Wound Open Wounding Event: Pressure  Injury Status: Date Acquired: 05/02/2015 Comorbid Cataracts, Asthma, Hypertension, Type Weeks Of Treatment: 43 History: II Diabetes, Neuropathy, Received Clustered Wound: No Chemotherapy Photos Photo Uploaded By: Gretta Cool, RN, BSN, Kim on 02/27/2016 17:25:36 Wound Measurements Length: (cm) 1.2 Width: (cm) 3.8 Depth: (cm) 0.2 Area: (cm) 3.581 Volume: (cm) 0.716 % Reduction in Area: -1527.7% % Reduction in Volume: -3154.5% Wound Description Classification: Category/Stage II Wound Margin: Flat and Intact Exudate Amount: Medium Exudate Type: Serosanguineous Exudate Color: red, brown Wound Bed Granulation Amount: Medium (34-66%) Exposed Structure Granulation Quality: Pink Fascia Exposed: No Necrotic Amount: Medium (34-66%) Fat Layer Exposed: No Necrotic Quality: Adherent Slough Tendon Exposed: No Serena, Elnor H. ([id number]) Muscle Exposed: No Joint Exposed: No Bone Exposed: No Limited to Skin Breakdown Periwound Skin Texture Texture Color No Abnormalities Noted: No No Abnormalities Noted: No Callus: No Atrophie Blanche: No Crepitus: No Cyanosis: No Excoriation: No Ecchymosis: No Fluctuance: No Erythema: No Friable: No Hemosiderin Staining: No Induration: No Mottled: No Localized Edema: No Pallor: No Rash: No Rubor: No Scarring: Yes Moisture No Abnormalities Noted: No Dry / Scaly: No Maceration: No Moist: Yes Wound Preparation Ulcer Cleansing: Rinsed/Irrigated with Saline Topical Anesthetic Applied: Other: lidociane 4%, Treatment Notes Wound #3 (Midline Back) 1. Cleansed with: Clean wound with Normal Saline 2. Anesthetic Topical Lidocaine 4% cream to wound bed prior to debridement 4. Dressing Applied: Santyl Ointment 5. Secondary Dressing Applied Bordered Foam Dressing Electronic Signature(s) Signed: 02/27/2016 5:27:00 PM By: Gretta Cool RN, BSN, Kim RN, BSN Signed: 03/01/2016 8:34:12 AM By: Jeri Cos PA-C Entered By: Jeri Cos on 02/27/2016  15:00:46 Lueras, Lutisha H. ([id number]) -------------------------------------------------------------------------------- Wound Assessment Details Patient Name: Spisak, Geana H. Date of Service: 02/27/2016 2:30 PM Medical Record Number: [id number] Patient Account Number: [id number] Date of Birth/Sex: Jul 03, 1946 (70 y.o. Female) Treating RN: Cornell Barman Primary Care Physician: Paulita Cradle Other Clinician: Referring Physician: Paulita Cradle Treating Physician/Extender: Frann Rider in Treatment: 50 Wound Status Wound Number: 7 Primary Etiology: Pressure Ulcer Wound Location: Right Ischial Tuberosity Wound Status: Open Wounding Event: Pressure Injury Date Acquired: 08/01/2015 Weeks Of Treatment: 29 Clustered Wound: No Photos Photo Uploaded By: Gretta Cool, RN,  BSN, Kim on 02/27/2016 17:26:16 Wound Measurements Length: (cm) 0.5 Width: (cm) 0.4 Depth: (cm) 0.3 Area: (cm) 0.157 Volume: (cm) 0.047 % Reduction in Area: 95% % Reduction in Volume: 85% Wound Description Classification: Category/Stage III Periwound Skin Texture Texture Color No Abnormalities Noted: No No Abnormalities Noted: No Moisture No Abnormalities Noted: No Electronic Signature(s) Signed: 02/27/2016 5:27:00 PM By: Gretta Cool, RN, BSN, Kim RN, BSN Aschoff, Tashauna HMarland [name] ([id number]) Entered By: Gretta Cool, RN, BSN, Kim on 02/27/2016 15:30:39 Noffke, Kayron H. ([id number]) -------------------------------------------------------------------------------- Vitals Details Patient Name: [name], Keianna H. Date of Service: 02/27/2016 2:30 PM Medical Record Number: [id number] Patient Account Number: [id number] Date of Birth/Sex: 1945-09-26 (70 y.o. Female) Treating RN: Cornell Barman Primary Care Physician: Paulita Cradle Other Clinician: Referring Physician: Paulita Cradle Treating Physician/Extender: Frann Rider in Treatment: 50 Vital Signs Time Taken: 02:40 Temperature (F): 98.4 Height (in): 65 Pulse (bpm):  112 Source: Stated Blood Pressure (mmHg): 123/76 Weight (lbs): 100 Reference Range: 80 - 120 mg / dl Source: Stated Body Mass Index (BMI): 16.6 Electronic Signature(s) Signed: 03/01/2016 8:34:12 AM By: Jeri Cos PA-C Entered By: Jeri Cos on 02/27/2016 14:46:16

## 2016-03-05 ENCOUNTER — Encounter: Payer: Medicare Other | Admitting: Surgery

## 2016-03-05 DIAGNOSIS — E11621 Type 2 diabetes mellitus with foot ulcer: Secondary | ICD-10-CM | POA: Diagnosis not present

## 2016-03-05 NOTE — Progress Notes (Addendum)
HOXIE, Sahalie H. ([id number]) Visit Report for 03/05/2016 Chief Complaint Document Details Patient Name: [name], [name] Date of Service: 03/05/2016 2:15 PM Medical Record Number: [id number] Patient Account Number: [id number] Date of Birth/Sex: Feb 25, 1946 (70 y.o. Female) Treating RN: Cornell Barman Primary Care Physician: Paulita Cradle Other Clinician: Referring Physician: Paulita Cradle Treating Physician/Extender: Frann Rider in Treatment: 19 Information Obtained from: Patient Chief Complaint Patient presents to the wound care center for a consult due non healing wound. Ulcers on the right elbow and the right heel for about 1 month. Electronic Signature(s) Signed: 03/05/2016 3:53:36 PM By: Christin Fudge MD, FACS Entered By: Christin Fudge on 03/05/2016 15:53:36 Strozier, Jaanai H. ([id number]) -------------------------------------------------------------------------------- Debridement Details Patient Name: [name], [name] Date of Service: 03/05/2016 2:15 PM Medical Record Number: [id number] Patient Account Number: [id number] Date of Birth/Sex: 13-Nov-1945 (70 y.o. Female) Treating RN: Cornell Barman Primary Care Physician: Paulita Cradle Other Clinician: Referring Physician: Paulita Cradle Treating Physician/Extender: Frann Rider in Treatment: 51 Debridement Performed for Wound #11 Right,Distal Calcaneus Assessment: Performed By: Physician Christin Fudge, MD Debridement: Debridement Pre-procedure Yes Verification/Time Out Taken: Start Time: 14:30 Pain Control: Other : lidocaine 4% Level: Skin/Subcutaneous Tissue Total Area Debrided (L x 1.5 (cm) x 2 (cm) = 3 (cm) W): Tissue and other Viable, Non-Viable, Fibrin/Slough, Subcutaneous material debrided: Instrument: Curette Bleeding: Minimum Hemostasis Achieved: Pressure End Time: 14:33 Procedural Pain: 0 Post Procedural Pain: 1 Response to Treatment: Procedure was tolerated well Post Debridement  Measurements of Total Wound Length: (cm) 1.5 Stage: Category/Stage II Width: (cm) 2 Depth: (cm) 0.2 Volume: (cm) 0.471 Post Procedure Diagnosis Same as Pre-procedure Electronic Signature(s) Signed: 03/08/2016 7:59:30 AM By: Christin Fudge MD, FACS Signed: 03/08/2016 5:08:33 PM By: Gretta Cool RN, BSN, Kim RN, BSN Previous Signature: 03/05/2016 6:13:05 PM Version By: Gretta Cool RN, BSN, Kim RN, BSN Previous Signature: 03/08/2016 7:56:46 AM Version By: Christin Fudge MD, FACS Entered By: Christin Fudge on 03/08/2016 07:59:30 Leverich, Nalanie H. ([id number]) -------------------------------------------------------------------------------- HPI Details Patient Name: [name], [name] Date of Service: 03/05/2016 2:15 PM Medical Record Number: [id number] Patient Account Number: [id number] Date of Birth/Sex: Sep 25, 1945 (70 y.o. Female) Treating RN: Cornell Barman Primary Care Physician: Paulita Cradle Other Clinician: Referring Physician: Paulita Cradle Treating Physician/Extender: Frann Rider in Treatment: 71 History of Present Illness Location: Ulceration on the right heel and the right elbow. Quality: Patient reports experiencing a dull pain to affected area(s). Severity: Patient states wound (s) are getting better. Duration: Patient has had the wound for < 4 weeks prior to presenting for treatment Timing: Pain in wound is Intermittent (comes and goes Context: The wound appeared gradually over time Modifying Factors: Consults to this date include:Augmentin and Bactrim and also some heel protection with duoderm Associated Signs and Symptoms: Patient reports having difficulty standing for long periods. HPI Description: 70 year old female with history of peripheral neuropathy, history of diet controlled diabetes mellitus type 2, history of alcoholism here for wound consult sent by her PCP Dr. Sherilyn Cooter. [name] has pressure ulcers at her right elbow, bilateral heels. Plain films of right calcaneus without  acute bony process. Patient started by PCP on Augmentin, Bactrim as per orders, DuoDerm dressings applied - reports some improvement in her ulcer since last seen. Denies fever, chills, nausea, vomiting, diarrhea. [name] had a right humerus fracture in the middle of May and has had no surgery and arm is in a sling. [name] is also been laying in the bed for quite a while. Past medical history significant for essential hypertension, osteoporosis, peripheral neuropathy, alcoholism,  ataxia, personal history of breast cancer treated with surgery chemotherapy and radiation and this was done in December 2010. [name] is also status post laparoscopic cholecystectomy, pilonidal cyst excision, subcutaneous port placement, partial mastectomy on the left side, skin cancer removal. 03/21/2015 -- [name] says overall [name]'s been doing better and continues to smoke about 15 cigarettes a day. 03/21/2015 - her orthopedic doctor has said [name] may require surgery for her right humerus fracture. 04/04/2015 -- her orthopedic surgery has been scheduled for August 11. 04/18/2015 -- [name] is doing fine as far as her elbow and her right heel goes but [name] has developed some redness over prominence on her thoracic spine and wanted me to take a look at this. 05/02/2015 -- [name] had her surgery done and now is in a sling and support. Her back has developed a pressure injury of undetermined stage. [name] seems to be in better spirits. 05/16/2015 -- last week her right heel was looking great and we had healed it out, but [name] has not been offloading appropriately and has a deep tissue injury on the right heel again. The area on her right elbow has opened out with slough and the area in the thoracic spine is also getting worse. 05/30/2015 -- [name] has developed 2 new ulcerations one on her left ischial tuberosity and one on the sacral region. [name] is still working on getting up smoking but is also unable to take her vitamins as [name] says [name] develops a  diarrhea when [name] takes vitamins. [name] has increased her intake of proteins. 06/06/2015 -- the patient's husband manages to get her a low air loss mattress with initiating pressure but Wieneke, Nolan H. ([id number]) did not know how to use it exactly and the patient was not happy about using it. Overall [name] says [name]'s been feeling better. 07/11/2015 -- . Discussed a surgical opinion for debridement and application of a wound VAC, 2 weeks ago but the patient has been reluctant to get a surgical opinion as [name] wants to avoid surgery. 07/18/2015 -- they have an appointment to see Dr. Tamala Julian this coming Wednesday. 07/29/2015 -- they saw Dr. Tamala Julian in his office and he did a debridement of the wound and removed significant amount of slough. This is in addition to the debridement I had done previously on Friday where a large amount of the eschar was removed. He did not recommend the application of wound VAC. Addendum : Dr. Thompson Caul note was reviewed via EPIC and details noted as above. 08/05/2015 -- over the last week [name] has developed a another pressure injury to her right hip and has had significant discoloration of the skin and an eschar there. 08/15/2015 -- [name] is still smoking about a pack of cigarettes a day and does not seem to want to quit. They have not been able to talk to the vendor regarding the air mattress and I will ask them to get in touch again. [name] is reluctant to take vitamins and does admit her nutrition is poor. 08/22/2015 -- [name] has been unable to tolerate her vitamins and continues to smoke. They're working on getting a low air loss mattress and have been speaking with the vendor. 09/19/2015 -- since her last visit and was admitted to the hospital between 09/09/2015 and 09/16/2015. [name] was thought to have an active sepsis possibly from one of her decubitus ulcers but nothing was grown except for an MSSA from her thoracic spine region. CT of this area did not show any osteomyelitis.  Been  given IV antibiotics which included vancomycin and Zosyn in the hospital under the care of Dr. [name] the ID specialist [name] was sent home on oral Keflex 500 mg 3 times a day for 2 weeks. he will consider an MRI in the outpatient setting to completely rule out osteomyelitis of the spine. 10/03/2015 --readmitted to hospital on 09/23/2015 for general debility, lethargy and possible sepsis and workup was in progress. Seen by Dr. [name] and he would also like a workup for underlying malignancy as sheos had previous ultrasounds of the breasts suggesting findings of concern. Workup done so far does not suggest deep bony infection. [name] was treated for a pneumonia and received Zosyn and vancomycin. [name] had been recommended Cipro 500 twice a day and doxycycline 100 mg twice a day once [name] was to be [place] home. The antibiotics were to be stopped on 10/07/2015. 10/17/2015 -- patient is feeling much better and has been eating better and doing her offloading as much as possible. 10/23/2015 -- [name] has an appointment to see Dr. [name] tomorrow but other than that has been doing as much as possible with offloading and increasing her diet. 11/07/2015 -- [name] has not been here to see [place] for 2 weeks and at the present time continues to try and eat better, work on off loading and is using the air mattress at night. [name] saw her PCP yesterday and [name] has put her back on a fentanyl patch. 11/21/2015 -- for some strange reason [name] has been sitting in a chair with her legs dependent for the last 6 days nonstop and has developed massive lower extremity edema and pedal edema with weeping and ulceration. 02/13/2016 -- it's been 3 weeks since I saw her last and overall [name]'s done well except for a right heel where [name] has had some new ulcerations. Electronic Signature(s) Signed: 03/05/2016 3:53:40 PM By: Christin Fudge MD, FACS Entered By: Christin Fudge on 03/05/2016 15:53:39 Roza, Ariellah H.  ([id number]) Gleaves, Haunani H. ([id number]) -------------------------------------------------------------------------------- Physical Exam Details Patient Name: Visconti, Shandrell H. Date of Service: 03/05/2016 2:15 PM Medical Record Number: [id number] Patient Account Number: [id number] Date of Birth/Sex: 1946/04/24 (70 y.o. Female) Treating RN: Cornell Barman Primary Care Physician: Paulita Cradle Other Clinician: Referring Physician: Paulita Cradle Treating Physician/Extender: Frann Rider in Treatment: 51 Constitutional . Pulse regular. Respirations normal and unlabored. Afebrile. . Eyes Nonicteric. Reactive to light. Ears, Nose, Mouth, and Throat Lips, teeth, and gums WNL.Marland Kitchen Moist mucosa without lesions. Neck supple and nontender. No palpable supraclavicular or cervical adenopathy. Normal sized without goiter. Respiratory WNL. No retractions.. Cardiovascular Pedal Pulses WNL. No clubbing, cyanosis or edema. Lymphatic No adneopathy. No adenopathy. No adenopathy. Musculoskeletal Adexa without tenderness or enlargement.. Digits and nails w/o clubbing, cyanosis, infection, petechiae, ischemia, or inflammatory conditions.. Integumentary (Hair, Skin) No suspicious lesions. No crepitus or fluctuance. No peri-wound warmth or erythema. No masses.Marland Kitchen Psychiatric Judgement and insight Intact.. No evidence of depression, anxiety, or agitation.. Notes today the back is looking very good and we will not need any debridement. The right hip was washed out with moist saline gauze and a Q-tip and the base is looking cleaner. The right medial calcaneus required sharp debridement with a #3 curet and because of tenderness we had to stop and bleeding controlled with pressure. Electronic Signature(s) Signed: 03/05/2016 3:54:21 PM By: Christin Fudge MD, FACS Entered By: Christin Fudge on 03/05/2016 15:54:20 Francisco, Levina H.  ([id number]) -------------------------------------------------------------------------------- Physician Orders Details Patient Name: [name], Jessicca H. Date of Service: 03/05/2016 2:15 PM Medical  Record Number: [id number] Patient Account Number: [id number] Date of Birth/Sex: [birth year] (70 y.o. 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 Wound #15 Right,Medial Calcaneus o Clean wound with Normal Saline. Wound #3 Midline Back o Clean wound with Normal Saline. Wound #7 Right Ischial Tuberosity o Clean wound with Normal Saline. Anesthetic Wound #11 Right,Distal Calcaneus o Topical Lidocaine 4% cream applied to wound bed prior to debridement - applied in clinic only Wound #15 Right,Medial Calcaneus o Topical Lidocaine 4% cream applied to wound bed prior to debridement - applied in clinic only Wound #3 Midline Back o Topical Lidocaine 4% cream applied to wound bed prior to debridement - applied in clinic only Wound #7 Right Ischial Tuberosity o Topical Lidocaine 4% cream applied to wound bed prior to debridement - applied in clinic only Primary Wound Dressing Wound #11 Right,Distal Calcaneus Lance, Miosotis H. ([id number]) o Santyl Ointment Wound #15 Right,Medial Calcaneus o Other: - Bordered Foam Dressing Wound #3 Midline Back o  Other: - RTD cut to size of wound (sent with patient) Wound #7 Right Ischial Tuberosity o Iodoform packing Gauze - Pack lightly into wound (sent with patient) Secondary Dressing Wound #11 Right,Distal Calcaneus o Boardered Foam Dressing Wound #3 Midline Back o Boardered Foam Dressing Wound #7 Right Ischial Tuberosity o Boardered Foam Dressing Dressing Change Frequency Wound #11 Right,Distal Calcaneus o Change dressing every day. Wound #15 Right,Medial Calcaneus o Change dressing every other day. Wound #3 Midline Back o Change dressing every other day. Wound #7 Right Ischial Tuberosity o Change dressing every other day. Follow-up Appointments Wound #11 Right,Distal Calcaneus o Return Appointment in 1 week. Wound #15 Right,Medial Calcaneus o Return Appointment in 1 week. Wound #3 Midline Back o Return Appointment in 1 week. Wound #7 Right Ischial Tuberosity o Return Appointment in 1 week. Off-Loading Lipsett, Harmani H. ([id number]) Wound #11 Right,Distal Calcaneus o Turn and reposition every 2 hours - Keep pressure off of back and heels Wound #15 Right,Medial Calcaneus o Turn and reposition every 2 hours - Keep pressure off of back and heels Wound #3 Midline Back o Turn and reposition every 2 hours - Keep pressure off of back and heels Additional Orders / Instructions Wound #11 Right,Distal Calcaneus o Stop Smoking o Increase protein intake. Wound #15 Right,Medial Calcaneus o Stop Smoking o Increase protein intake. Wound #3 Midline Back o Stop Smoking o Increase protein intake. Wound #7 Right Ischial Tuberosity o Stop Smoking o Increase protein intake. Home Health Wound #11 [place] Visits - [place] Nurse may visit PRN to address patientos wound care needs. o FACE TO FACE ENCOUNTER: MEDICARE and MEDICAID PATIENTS: I certify that this patient is under my care and that  I had a face-to-face encounter that meets the physician face-to-face encounter requirements with this patient on this date. The encounter with the patient was in whole or in part for the following MEDICAL CONDITION: (primary reason for [place]) MEDICAL NECESSITY: I certify, that based  on my findings, NURSING services are a medically necessary home health service. HOME BOUND STATUS: I certify that my clinical findings support that this patient is homebound (i.e., Due to illness or injury, pt requires aid of supportive devices such as crutches, cane, wheelchairs, walkers, the use of special transportation or the assistance of another person to leave their place of residence. There is a normal inability to leave the home and doing so requires considerable and taxing effort. Other absences are for medical reasons / religious services and are infrequent or of short duration when for other reasons). o If current dressing causes regression in wound condition, may D/C ordered dressing product/s and apply Normal Saline Moist Dressing daily until next [place] / Other MD appointment. [place] of regression in wound condition at [phone]. o Please direct any NON-WOUND related issues/requests for orders to patient's Primary Care Physician Wound #15 Right,Medial Calcaneus CLACK, Arayna H. ([id number]) o [place] Visits - [place] Nurse may visit PRN to address patientos wound care needs. o FACE TO FACE ENCOUNTER: MEDICARE and MEDICAID PATIENTS: I certify that this patient is under my care and that I had a face-to-face encounter that meets the physician face-to-face encounter requirements with this patient on this date. The encounter with the patient was in whole or in part for the following MEDICAL CONDITION: (primary reason for [place]) MEDICAL NECESSITY: I certify, that based on my findings, NURSING services are a  medically necessary home health service. HOME BOUND STATUS: I certify that my clinical findings support that this patient is homebound (i.e., Due to illness or injury, pt requires aid of supportive devices such as crutches, cane, wheelchairs, walkers, the use of special transportation or the assistance of another person to leave their place of residence. There is a normal inability to leave the home and doing so requires considerable and taxing effort. Other absences are for medical reasons / religious services and are infrequent or of short duration when for other reasons). o If current dressing causes regression in wound condition, may D/C ordered dressing product/s and apply Normal Saline Moist Dressing daily until next [place] / Other MD appointment. [place] of regression in wound condition at [phone]. o Please direct any NON-WOUND related issues/requests for orders to patient's Primary Care Physician Wound #3 Midline Back o [place] Visits - [place] Nurse may visit PRN to address patientos wound care needs. o FACE TO FACE ENCOUNTER: MEDICARE and MEDICAID PATIENTS: I certify that this patient is under my care and that I had a face-to-face encounter that meets the physician face-to-face encounter requirements with this patient on this date. The encounter with the patient was in whole or in part for the following MEDICAL CONDITION: (primary reason for [place]) MEDICAL NECESSITY: I certify, that based on my findings, NURSING services are a medically necessary home health service. HOME BOUND STATUS: I certify that my clinical findings support that this patient is homebound (i.e., Due to illness or injury, pt requires aid of supportive devices such as crutches, cane, wheelchairs, walkers, the use of special transportation or the assistance of another person to leave their place of residence. There is a normal  inability to leave the home and doing so requires considerable and taxing effort. Other absences are for medical reasons / religious services and are infrequent or of short duration when for other reasons). o If current dressing causes regression in wound condition,  may D/C ordered dressing product/s and apply Normal Saline Moist Dressing daily until next [place] / Other MD appointment. [place] of regression in wound condition at [phone]. o Please direct any NON-WOUND related issues/requests for orders to patient's Primary Care Physician Wound #7 Right Ischial [place] Visits - [place] Nurse may visit PRN to address patientos wound care needs. o FACE TO FACE ENCOUNTER: MEDICARE and MEDICAID PATIENTS: I certify that this patient is under my care and that I had a face-to-face encounter that meets the physician face-to-face encounter requirements with this patient on this date. The encounter with the patient was in whole or in part for the following MEDICAL CONDITION: (primary reason for [place]) MEDICAL NECESSITY: I certify, that based on my findings, NURSING services are a medically necessary home health service. HOME BOUND STATUS: I certify that my clinical findings Vand[place]eer, Siani H. ([id number]) support that this patient is homebound (i.e., Due to illness or injury, pt requires aid of supportive devices such as crutches, cane, wheelchairs, walkers, the use of special transportation or the assistance of another person to leave their place of residence. There is a normal inability to leave the home and doing so requires considerable and taxing effort. Other absences are for medical reasons / religious services and are infrequent or of short duration when for other reasons). o If current dressing causes regression in wound condition, may D/C ordered dressing product/s and apply Normal Saline Moist  Dressing daily until next [place] / Other MD appointment. [place] of regression in wound condition at [phone]. o Please direct any NON-WOUND related issues/requests for orders to patient's Primary Care Physician Electronic Signature(s) Signed: 03/05/2016 6:13:05 PM By: Gretta Cool RN, BSN, Kim RN, BSN Signed: 03/08/2016 7:56:46 AM By: Christin Fudge MD, FACS Entered By: Gretta Cool RN, BSN, Kim on 03/05/2016 17:47:43 Mallery, Charnell H. ([id number]) -------------------------------------------------------------------------------- Problem List Details Patient Name: Budzinski, Vincenzina H. Date of Service: 03/05/2016 2:15 PM Medical Record Number: [id number] Patient Account Number: [id number] Date of Birth/Sex: Nov 29, 1945 (70 y.o. Female) Treating RN: Cornell Barman Primary Care Physician: Paulita Cradle Other Clinician: Referring Physician: Paulita Cradle Treating Physician/Extender: Frann Rider in Treatment: 83 Active Problems ICD-10 Encounter Code Description Active Date Diagnosis E11.621 Type 2 diabetes mellitus with foot ulcer 03/13/2015 Yes F17.218 Nicotine dependence, cigarettes, with other nicotine- 03/13/2015 Yes induced disorders L89.100 Pressure ulcer of unspecified part of back, unstageable 05/02/2015 Yes L89.213 Pressure ulcer of right hip, stage 3 08/05/2015 Yes L89.613 Pressure ulcer of right heel, stage 3 02/27/2016 Yes Inactive Problems Resolved Problems ICD-10 Code Description Active Date Resolved Date L89.613 Pressure ulcer of right heel, stage 3 03/13/2015 03/13/2015 L89.013 Pressure ulcer of right elbow, stage 3 03/13/2015 03/13/2015 [id number] Pressure ulcer of left hip, stage 2 05/30/2015 05/30/2015 L89.153 Pressure ulcer of sacral region, stage 3 05/30/2015 05/30/2015 Soltau, Charmion H. [id number]) I89.0 Lymphedema, not elsewhere classified 11/21/2015 11/21/2015 L97.211 Non-pressure chronic ulcer of right calf limited to 11/21/2015 11/21/2015 breakdown  of skin Electronic Signature(s) Signed: 03/05/2016 3:53:11 PM By: Christin Fudge MD, FACS Entered By: Christin Fudge on 03/05/2016 15:53:11 Ayub, Tenise H. ([id number]) -------------------------------------------------------------------------------- Progress Note Details Patient Name: Pardy, Shyah H. Date of Service: 03/05/2016 2:15 PM Medical Record Number: [id number] Patient Account Number: [id number] Date of Birth/Sex: Apr 07, 1946 (70 y.o. Female) Treating RN: Cornell Barman Primary Care Physician: Paulita Cradle Other Clinician: Referring Physician: Paulita Cradle Treating Physician/Extender: Frann Rider in Treatment: 74 Subjective Chief Complaint Information  obtained from Patient Patient presents to the wound care center for a consult due non healing wound. Ulcers on the right elbow and the right heel for about 1 month. History of Present Illness (HPI) The following HPI elements were documented for the patient's wound: Location: Ulceration on the right heel and the right elbow. Quality: Patient reports experiencing a dull pain to affected area(s). Severity: Patient states wound (s) are getting better. Duration: Patient has had the wound for < 4 weeks prior to presenting for treatment Timing: Pain in wound is Intermittent (comes and goes Context: The wound appeared gradually over time Modifying Factors: Consults to this date include:Augmentin and Bactrim and also some heel protection with duoderm Associated Signs and Symptoms: Patient reports having difficulty standing for long periods. 70 year old female with history of peripheral neuropathy, history of diet controlled diabetes mellitus type 2, history of alcoholism here for wound consult sent by her PCP Dr. Sherilyn Cooter. [name] has pressure ulcers at her right elbow, bilateral heels. Plain films of right calcaneus without acute bony process. Patient started by PCP on Augmentin, Bactrim as per orders, DuoDerm dressings applied -  reports some improvement in her ulcer since last seen. Denies fever, chills, nausea, vomiting, diarrhea. [name] had a right humerus fracture in the middle of May and has had no surgery and arm is in a sling. [name] is also been laying in the bed for quite a while. Past medical history significant for essential hypertension, osteoporosis, peripheral neuropathy, alcoholism, ataxia, personal history of breast cancer treated with surgery chemotherapy and radiation and this was done in December 2010. [name] is also status post laparoscopic cholecystectomy, pilonidal cyst excision, subcutaneous port placement, partial mastectomy on the left side, skin cancer removal. 03/21/2015 -- [name] says overall [name]'s been doing better and continues to smoke about 15 cigarettes a day. 03/21/2015 - her orthopedic doctor has said [name] may require surgery for her right humerus fracture. 04/04/2015 -- her orthopedic surgery has been scheduled for August 11. 04/18/2015 -- [name] is doing fine as far as her elbow and her right heel goes but [name] has developed some redness over prominence on her thoracic spine and wanted me to take a look at this. Casanova, Jaimy H. ([id number]) 05/02/2015 -- [name] had her surgery done and now is in a sling and support. Her back has developed a pressure injury of undetermined stage. [name] seems to be in better spirits. 05/16/2015 -- last week her right heel was looking great and we had healed it out, but [name] has not been offloading appropriately and has a deep tissue injury on the right heel again. The area on her right elbow has opened out with slough and the area in the thoracic spine is also getting worse. 05/30/2015 -- [name] has developed 2 new ulcerations one on her left ischial tuberosity and one on the sacral region. [name] is still working on getting up smoking but is also unable to take her vitamins as [name] says [name] develops a diarrhea when [name] takes vitamins. [name] has increased her intake of  proteins. 06/06/2015 -- the patient's husband manages to get her a low air loss mattress with initiating pressure but did not know how to use it exactly and the patient was not happy about using it. Overall [name] says [name]'s been feeling better. 07/11/2015 -- . Discussed a surgical opinion for debridement and application of a wound VAC, 2 weeks ago but the patient has been reluctant to get a surgical opinion as [name] wants to avoid  surgery. 07/18/2015 -- they have an appointment to see Dr. Tamala Julian this coming Wednesday. 07/29/2015 -- they saw Dr. Tamala Julian in his office and he did a debridement of the wound and removed significant amount of slough. This is in addition to the debridement I had done previously on Friday where a large amount of the eschar was removed. He did not recommend the application of wound VAC. Addendum : Dr. Thompson Caul note was reviewed via EPIC and details noted as above. 08/05/2015 -- over the last week [name] has developed a another pressure injury to her right hip and has had significant discoloration of the skin and an eschar there. 08/15/2015 -- [name] is still smoking about a pack of cigarettes a day and does not seem to want to quit. They have not been able to talk to the vendor regarding the air mattress and I will ask them to get in touch again. [name] is reluctant to take vitamins and does admit her nutrition is poor. 08/22/2015 -- [name] has been unable to tolerate her vitamins and continues to smoke. They're working on getting a low air loss mattress and have been speaking with the vendor. 09/19/2015 -- since her last visit and was admitted to the hospital between 09/09/2015 and 09/16/2015. [name] was thought to have an active sepsis possibly from one of her decubitus ulcers but nothing was grown except for an MSSA from her thoracic spine region. CT of this area did not show any osteomyelitis. Been given IV antibiotics which included vancomycin and Zosyn in the hospital under the care of  Dr. [name] the ID specialist [name] was sent home on oral Keflex 500 mg 3 times a day for 2 weeks. he will consider an MRI in the outpatient setting to completely rule out osteomyelitis of the spine. 10/03/2015 --readmitted to hospital on 09/23/2015 for general debility, lethargy and possible sepsis and workup was in progress. Seen by Dr. [name] and he would also like a workup for underlying malignancy as [name] s had previous ultrasounds of the breasts suggesting findings of concern. Workup done so far does not suggest deep bony infection. [name] was treated for a pneumonia and received Zosyn and vancomycin. [name] had been recommended Cipro 500 twice a day and doxycycline 100 mg twice a day once [name] was to be [place] home. The antibiotics were to be stopped on 10/07/2015. 10/17/2015 -- patient is feeling much better and has been eating better and doing her offloading as much as possible. 10/23/2015 -- [name] has an appointment to see Dr. [name] tomorrow but other than that has been doing as much as possible with offloading and increasing her diet. 11/07/2015 -- [name] has not been here to see [place] for 2 weeks and at the present time continues to try and eat better, work on off loading and is using the air mattress at night. [name] saw her PCP yesterday and [name] has put her back on a fentanyl patch. 11/21/2015 -- for some strange reason [name] has been sitting in a chair with her legs dependent for the last 6 days nonstop and has developed massive lower extremity edema and pedal edema with weeping and ulceration. Logiudice, Cathey H. ([id number]) 02/13/2016 -- it's been 3 weeks since I saw her last and overall [name]'s done well except for a right heel where [name] has had some new ulcerations. Objective Constitutional Pulse regular. Respirations normal and unlabored. Afebrile. Vitals Time Taken: 2:20 PM, Height: 65 in, Weight: 100 lbs, BMI: 16.6, Temperature: 98 F, Pulse: 123 bpm,  Respiratory Rate: 18 breaths/min,  Blood Pressure: 122/80 mmHg. Eyes Nonicteric. Reactive to light. Ears, Nose, Mouth, and Throat Lips, teeth, and gums WNL.Marland Kitchen Moist mucosa without lesions. Neck supple and nontender. No palpable supraclavicular or cervical adenopathy. Normal sized without goiter. Respiratory WNL. No retractions.. Cardiovascular Pedal Pulses WNL. No clubbing, cyanosis or edema. Lymphatic No adneopathy. No adenopathy. No adenopathy. Musculoskeletal Adexa without tenderness or enlargement.. Digits and nails w/o clubbing, cyanosis, infection, petechiae, ischemia, or inflammatory conditions.Marland Kitchen Psychiatric Judgement and insight Intact.. No evidence of depression, anxiety, or agitation.. General Notes: today the back is looking very good and we will not need any debridement. The right hip was washed out with moist saline gauze and a Q-tip and the base is looking cleaner. The right medial calcaneus required sharp debridement with a #3 curet and because of tenderness we had to stop and bleeding controlled with pressure. Integumentary (Hair, Skin) Christon, Gracelin H. ([id number]) No suspicious lesions. No crepitus or fluctuance. No peri-wound warmth or erythema. No masses.. Wound #11 status is Open. Original cause of wound was Pressure Injury. The wound is located on the Right,Distal Calcaneus. The wound measures 1.5cm length x 2cm width x 0.1cm depth; 2.356cm^2 area and 0.236cm^3 volume. Wound #15 status is Open. Original cause of wound was Pressure Injury. The wound is located on the Right,Medial Calcaneus. The wound measures 0.3cm length x 0.2cm width x 0.1cm depth; 0.047cm^2 area and 0.005cm^3 volume. The wound is limited to skin breakdown. There is a small amount of drainage noted. There is no granulation within the wound bed. There is no necrotic tissue within the wound bed. The periwound skin appearance did not exhibit: Callus, Crepitus, Excoriation, Fluctuance, Friable, Induration, Localized Edema, Rash,  Scarring, Dry/Scaly, Maceration, Moist, Atrophie Blanche, Cyanosis, Ecchymosis, Hemosiderin Staining, Mottled, Pallor, Rubor, Erythema. Wound #3 status is Open. Original cause of wound was Pressure Injury. The wound is located on the Midline Back. The wound measures 1.7cm length x 3.5cm width x 0.1cm depth; 4.673cm^2 area and 0.467cm^3 volume. Wound #7 status is Open. Original cause of wound was Pressure Injury. The wound is located on the Right Ischial Tuberosity. The wound measures 0.5cm length x 0.7cm width x 0.3cm depth; 0.275cm^2 area and 0.082cm^3 volume. 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 There was a Skin/Subcutaneous Tissue Debridement [id number]) debridement with total area of 3 sq cm performed by Christin Fudge, MD. with the following instrument(s): Curette to remove Viable and Non-Viable tissue/material including Fibrin/Slough and Subcutaneous after achieving pain control using Other (lidocaine 4%). A time out was conducted prior to the start of the procedure. A Minimum amount of bleeding was controlled with Pressure. The procedure was tolerated well with a pain level of 0 throughout and a pain level of 1 following the Hasan, Jiayi H. ([id number]) procedure. Post Debridement Measurements: 1.5cm length x 2cm width x 0.2cm depth; 0.471cm^3 volume. Post debridement Stage noted as Category/Stage II. Post procedure Diagnosis Wound #11: Same as Pre-Procedure Plan Wound Cleansing: Wound #11 Right,Distal Calcaneus: Clean wound with Normal Saline. Wound #15 Right,Medial Calcaneus: Clean wound with Normal  Saline. Wound #3 Midline Back: Clean wound with Normal Saline. Wound #7 Right Ischial Tuberosity: Clean wound  with Normal Saline. Anesthetic: Wound #11 Right,Distal Calcaneus: Topical Lidocaine 4% cream applied to wound bed prior to debridement - applied in clinic only Wound #15 Right,Medial Calcaneus: Topical Lidocaine 4% cream applied to wound bed prior to debridement - applied in clinic only Wound #3 Midline Back: Topical Lidocaine 4% cream applied to wound bed prior to debridement - applied in clinic only Wound #7 Right Ischial Tuberosity: Topical Lidocaine 4% cream applied to wound bed prior to debridement - applied in clinic only Primary Wound Dressing: Wound #11 Right,Distal Calcaneus: Santyl Ointment Wound #15 Right,Medial Calcaneus: Other: - Bordered Foam Dressing Wound #3 Midline Back: Other: - RTD cut to size of wound (sent with patient) Wound #7 Right Ischial Tuberosity: Iodoform packing Gauze - Pack lightly into wound (sent with patient) Secondary Dressing: Wound #11 Right,Distal Calcaneus: Boardered Foam Dressing Wound #3 Midline Back: Boardered Foam Dressing Wound #7 Right Ischial Tuberosity: Boardered Foam Dressing Dressing Change Frequency: Wound #11 Right,Distal Calcaneus: Change dressing every day. Wound #15 Right,Medial Calcaneus: Change dressing every other day. Roddey, Sayda H. ([id number]) Wound #3 Midline Back: Change dressing every other day. Wound #7 Right Ischial Tuberosity: Change dressing every other day. Follow-up Appointments: Wound #11 Right,Distal Calcaneus: Return Appointment in 1 week. Wound #15 Right,Medial Calcaneus: Return Appointment in 1 week. Wound #3 Midline Back: Return Appointment in 1 week. Wound #7 Right Ischial Tuberosity: Return Appointment in 1 week. Off-Loading: Wound #11 Right,Distal Calcaneus: Turn and reposition every 2 hours - Keep pressure off of back and heels Wound #15 Right,Medial Calcaneus: Turn and  reposition every 2 hours - Keep pressure off of back and heels Wound #3 Midline Back: Turn and reposition every 2 hours - Keep pressure off of back and heels Additional Orders / Instructions: Wound #11 Right,Distal Calcaneus: Stop Smoking Increase protein intake. Wound #15 Right,Medial Calcaneus: Stop Smoking Increase protein intake. Wound #3 Midline Back: Stop Smoking Increase protein intake. Wound #7 Right Ischial Tuberosity: Stop Smoking Increase protein intake. Home Health: Wound #11 Right,Distal Calcaneus: Continue Home Health Visits - [facility] Nurse may visit PRN to address patient s wound care needs. FACE TO FACE ENCOUNTER: MEDICARE and MEDICAID PATIENTS: I certify that this patient is under my care and that I had a face-to-face encounter that meets the physician face-to-face encounter requirements with this patient on this date. The encounter with the patient was in whole or in part for the following MEDICAL CONDITION: (primary reason for [place]) MEDICAL NECESSITY: I certify, that based on my findings, NURSING services are a medically necessary home health service. HOME BOUND STATUS: I certify that my clinical findings support that this patient is homebound (i.e., Due to illness or injury, pt requires aid of supportive devices such as crutches, cane, wheelchairs, walkers, the use of special transportation or the assistance of another person to leave their place of residence. There is a normal inability to leave the home and doing so requires considerable and taxing effort. Other absences are for medical reasons / religious services and are infrequent or of short duration when for other reasons). If current dressing causes regression in wound condition, may D/C ordered dressing product/s and apply Normal Saline Moist Dressing daily until next [place] / Other MD appointment. [place] of regression in wound condition at  [phone]. Please direct any NON-WOUND related issues/requests for orders to patient's Primary Care Physician [place], Taaliyah HMarland Kitchen ([id number]) Wound #15 Right,Medial Calcaneus: [place] Visits - [facility] Nurse may visit PRN to  address patient s wound care needs. FACE TO FACE ENCOUNTER: MEDICARE and MEDICAID PATIENTS: I certify that this patient is under my care and that I had a face-to-face encounter that meets the physician face-to-face encounter requirements with this patient on this date. The encounter with the patient was in whole or in part for the following MEDICAL CONDITION: (primary reason for [place]) MEDICAL NECESSITY: I certify, that based on my findings, NURSING services are a medically necessary home health service. HOME BOUND STATUS: I certify that my clinical findings support that this patient is homebound (i.e., Due to illness or injury, pt requires aid of supportive devices such as crutches, cane, wheelchairs, walkers, the use of special transportation or the assistance of another person to leave their place of residence. There is a normal inability to leave the home and doing so requires considerable and taxing effort. Other absences are for medical reasons / religious services and are infrequent or of short duration when for other reasons). If current dressing causes regression in wound condition, may D/C ordered dressing product/s and apply Normal Saline Moist Dressing daily until next [place] / Other MD appointment. [place] of regression in wound condition at [phone]. Please direct any NON-WOUND related issues/requests for orders to patient's Primary Care Physician Wound #3 Midline Back: [place] Visits - [facility] Nurse may visit PRN to address patient s wound care needs. FACE TO FACE ENCOUNTER: MEDICARE and MEDICAID PATIENTS: I certify that this patient is under my care and that I  had a face-to-face encounter that meets the physician face-to-face encounter requirements with this patient on this date. The encounter with the patient was in whole or in part for the following MEDICAL CONDITION: (primary reason for [place]) MEDICAL NECESSITY: I certify, that based on my findings, NURSING services are a medically necessary home health service. HOME BOUND STATUS: I certify that my clinical findings support that this patient is homebound (i.e., Due to illness or injury, pt requires aid of supportive devices such as crutches, cane, wheelchairs, walkers, the use of special transportation or the assistance of another person to leave their place of residence. There is a normal inability to leave the home and doing so requires considerable and taxing effort. Other absences are for medical reasons / religious services and are infrequent or of short duration when for other reasons). If current dressing causes regression in wound condition, may D/C ordered dressing product/s and apply Normal Saline Moist Dressing daily until next [place] / Other MD appointment. [place] of regression in wound condition at [phone]. Please direct any NON-WOUND related issues/requests for orders to patient's Primary Care Physician Wound #7 Right Ischial Tuberosity: [place] Visits - [facility] Nurse may visit PRN to address patient s wound care needs. FACE TO FACE ENCOUNTER: MEDICARE and MEDICAID PATIENTS: I certify that this patient is under my care and that I had a face-to-face encounter that meets the physician face-to-face encounter requirements with this patient on this date. The encounter with the patient was in whole or in part for the following MEDICAL CONDITION: (primary reason for [place]) MEDICAL NECESSITY: I certify, that based on my findings, NURSING services are a medically necessary home health service. HOME BOUND  STATUS: I certify that my clinical findings support that this patient is homebound (i.e., Due to illness or injury, pt requires aid of supportive devices such as crutches, cane, wheelchairs, walkers, the use of special  transportation or the assistance of another person to leave their place of residence. There is a normal inability to leave the home and doing so requires considerable and taxing effort. Other absences are for medical reasons / religious services and are infrequent or of short duration when for other reasons). If current dressing causes regression in wound condition, may D/C ordered dressing product/s and apply Normal Saline Moist Dressing daily until next [place] / Other MD appointment. [place] of regression in wound condition at [phone]. Please direct any NON-WOUND related issues/requests for orders to patient's Primary Care Physician [place], [place]. ([id number]) today I have recommended: 1. RTD and bordered foam to the Thoracic spine area. 2. iodoform gauze to the right hip 3. Santyl ointment to the right medial calcaneus 4. nutrition with high protein, vitamin supplements and good control of her diabetes has been discussed Electronic Signature(s) Signed: 03/08/2016 7:59:42 AM By: Christin Fudge MD, FACS Previous Signature: 03/05/2016 3:55:47 PM Version By: Christin Fudge MD, FACS Entered By: Christin Fudge on 03/08/2016 07:59:42 [place], Steffany H. ([id number]) -------------------------------------------------------------------------------- SuperBill Details Patient Name: [name], Shayli H. Date of Service: 03/05/2016 Medical Record Number: [id number] Patient Account Number: [id number] Date of Birth/Sex: [birth year] (70 y.o. 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, stage 3 L89.613 Pressure ulcer of right heel, stage 3 Facility Procedures CPT4 Code: [id number] Description: [id number] - DEB SUBQ TISSUE 20 SQ CM/< ICD-10 Description Diagnosis 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.100 Pressure ulcer of unspecified part of back, uns L89.213 Pressure ulcer of right hip, stage 3 L89.613 Pressure ulcer of  right heel, stage 3 Modifier: tageable Quantity: 1 Physician Procedures CPT4 Code: [id number] Description: [id number] - WC PHYS SUBQ TISS 20 SQ CM ICD-10 Description Diagnosis 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.100 Pressure ulcer of unspecified part of back, uns L89.213 Pressure ulcer of right hip, stage 3 L89.613 Pressure ulcer of  right heel, stage 3 Modifier: tageable Quantity: 1 Electronic Signature(s) Signed: 03/05/2016 3:56:09 PM By: Christin Fudge MD, FACS Entered By: Christin Fudge on 03/05/2016 15:56:09

## 2016-03-12 ENCOUNTER — Ambulatory Visit: Payer: Medicare Other | Admitting: Surgery

## 2016-03-19 ENCOUNTER — Ambulatory Visit: Payer: Medicare Other | Admitting: Surgery

## 2016-03-26 ENCOUNTER — Encounter: Payer: Medicare Other | Attending: General Surgery | Admitting: General Surgery

## 2016-03-26 DIAGNOSIS — L89100 Pressure ulcer of unspecified part of back, unstageable: Secondary | ICD-10-CM | POA: Insufficient documentation

## 2016-03-26 DIAGNOSIS — L8991 Pressure ulcer of unspecified site, stage 1: Secondary | ICD-10-CM | POA: Diagnosis not present

## 2016-03-26 DIAGNOSIS — L89622 Pressure ulcer of left heel, stage 2: Secondary | ICD-10-CM | POA: Diagnosis not present

## 2016-03-26 DIAGNOSIS — E11621 Type 2 diabetes mellitus with foot ulcer: Secondary | ICD-10-CM | POA: Diagnosis present

## 2016-03-26 DIAGNOSIS — L89612 Pressure ulcer of right heel, stage 2: Secondary | ICD-10-CM

## 2016-03-26 DIAGNOSIS — E1140 Type 2 diabetes mellitus with diabetic neuropathy, unspecified: Secondary | ICD-10-CM | POA: Diagnosis not present

## 2016-03-26 DIAGNOSIS — I890 Lymphedema, not elsewhere classified: Secondary | ICD-10-CM | POA: Diagnosis not present

## 2016-03-26 DIAGNOSIS — L89113 Pressure ulcer of right upper back, stage 3: Secondary | ICD-10-CM | POA: Diagnosis not present

## 2016-03-26 DIAGNOSIS — L89213 Pressure ulcer of right hip, stage 3: Secondary | ICD-10-CM | POA: Insufficient documentation

## 2016-03-26 DIAGNOSIS — F17218 Nicotine dependence, cigarettes, with other nicotine-induced disorders: Secondary | ICD-10-CM | POA: Insufficient documentation

## 2016-03-26 DIAGNOSIS — L89613 Pressure ulcer of right heel, stage 3: Secondary | ICD-10-CM | POA: Insufficient documentation

## 2016-03-26 DIAGNOSIS — M810 Age-related osteoporosis without current pathological fracture: Secondary | ICD-10-CM | POA: Diagnosis not present

## 2016-03-26 DIAGNOSIS — F1021 Alcohol dependence, in remission: Secondary | ICD-10-CM | POA: Insufficient documentation

## 2016-03-26 DIAGNOSIS — I10 Essential (primary) hypertension: Secondary | ICD-10-CM | POA: Diagnosis not present

## 2016-03-26 DIAGNOSIS — L089 Local infection of the skin and subcutaneous tissue, unspecified: Secondary | ICD-10-CM

## 2016-03-26 DIAGNOSIS — Z853 Personal history of malignant neoplasm of breast: Secondary | ICD-10-CM | POA: Diagnosis not present

## 2016-03-26 NOTE — Progress Notes (Signed)
Multiple pressure ulcers--heels,hip,backTreat back with Santyl the rest with alginate

## 2016-03-27 NOTE — Progress Notes (Addendum)
DOUTY, Cedric H. ([id number]) Visit Report for 03/26/2016 Chief Complaint Document Details Patient Name: [name], [name] Date of Service: 03/26/2016 3:00 PM Medical Record Number: [id number] Patient Account Number: [id number] Date of Birth/Sex: 07-22-1946 (70 y.o. Female) Treating RN: Cornell Barman Primary Care Physician: Paulita Cradle Other Clinician: Referring Physician: Paulita Cradle Treating Physician/Extender: Benjaman Pott in Treatment: 54 Information Obtained from: Patient Chief Complaint Patient presents to the wound care center for a consult due non healing wound. Ulcers on the right elbow and the right heel for about 1 month. Electronic Signature(s) Signed: 03/26/2016 4:40:43 PM By: Judene Companion MD Entered By: Judene Companion on 03/26/2016 16:40:43 Livesay, Shaunice H. ([id number]) -------------------------------------------------------------------------------- HPI Details Patient Name: [name], [name] Date of Service: 03/26/2016 3:00 PM Medical Record Number: [id number] Patient Account Number: [id number] Date of Birth/Sex: 02/11/1946 (70 y.o. Female) Treating RN: Cornell Barman Primary Care Physician: Paulita Cradle Other Clinician: Referring Physician: Paulita Cradle Treating Physician/Extender: Benjaman Pott in Treatment: 2 History of Present Illness Location: Ulceration on the right heel and the right elbow. Quality: Patient reports experiencing a dull pain to affected area(s). Severity: Patient states wound (s) are getting better. Duration: Patient has had the wound for < 4 weeks prior to presenting for treatment Timing: Pain in wound is Intermittent (comes and goes Context: The wound appeared gradually over time Modifying Factors: Consults to this date include:Augmentin and Bactrim and also some heel protection with duoderm Associated Signs and Symptoms: Patient reports having difficulty standing for long periods. HPI Description: 70 year old female  with history of peripheral neuropathy, history of diet controlled diabetes mellitus type 2, history of alcoholism here for wound consult sent by her PCP Dr. Sherilyn Cooter. She has pressure ulcers at her right elbow, bilateral heels. Plain films of right calcaneus without acute bony process. Patient started by PCP on Augmentin, Bactrim as per orders, DuoDerm dressings applied - reports some improvement in her ulcer since last seen. Denies fever, chills, nausea, vomiting, diarrhea. She had a right humerus fracture in the middle of May and has had no surgery and arm is in a sling. She is also been laying in the bed for quite a while. Past medical history significant for essential hypertension, osteoporosis, peripheral neuropathy, alcoholism, ataxia, personal history of breast cancer treated with surgery chemotherapy and radiation and this was done in December 2010. she is also status post laparoscopic cholecystectomy, pilonidal cyst excision, subcutaneous port placement, partial mastectomy on the left side, skin cancer removal. 03/21/2015 -- she says overall she's been doing better and continues to smoke about 15 cigarettes a day. 03/21/2015 - her orthopedic doctor has said she may require surgery for her right humerus fracture. 04/04/2015 -- her orthopedic surgery has been scheduled for August 11. 04/18/2015 -- she is doing fine as far as her elbow and her right heel goes but she has developed some redness over prominence on her thoracic spine and wanted me to take a look at this. 05/02/2015 -- she had her surgery done and now is in a sling and support. Her back has developed a pressure injury of undetermined stage. She seems to be in better spirits. 05/16/2015 -- last week her right heel was looking great and we had healed it out, but she has not been offloading appropriately and has a deep tissue injury on the right heel again. The area on her right elbow has opened out with slough and the area in the  thoracic spine is also getting worse. 05/30/2015 -- she has developed 2  new ulcerations one on her left ischial tuberosity and one on the sacral region. She is still working on getting up smoking but is also unable to take her vitamins as she says she develops a diarrhea when she takes vitamins. She has increased her intake of proteins. 06/06/2015 -- the patient's husband manages to get her a low air loss mattress with initiating pressure but Catlin, Jann H. ([id number]) did not know how to use it exactly and the patient was not happy about using it. Overall she says she's been feeling better. 07/11/2015 -- . Discussed a surgical opinion for debridement and application of a wound VAC, 2 weeks ago but the patient has been reluctant to get a surgical opinion as she wants to avoid surgery. 07/18/2015 -- they have an appointment to see Dr. Tamala Julian this coming Wednesday. 07/29/2015 -- they saw Dr. Tamala Julian in his office and he did a debridement of the wound and removed significant amount of slough. This is in addition to the debridement I had done previously on Friday where a large amount of the eschar was removed. He did not recommend the application of wound VAC. Addendum : Dr. Thompson Caul note was reviewed via EPIC and details noted as above. 08/05/2015 -- over the last week she has developed a another pressure injury to her right hip and has had significant discoloration of the skin and an eschar there. 08/15/2015 -- she is still smoking about a pack of cigarettes a day and does not seem to want to quit. They have not been able to talk to the vendor regarding the air mattress and I will ask them to get in touch again. She is reluctant to take vitamins and does admit her nutrition is poor. 08/22/2015 -- she has been unable to tolerate her vitamins and continues to smoke. They're working on getting a low air loss mattress and have been speaking with the vendor. 09/19/2015 -- since her last visit and was  admitted to the hospital between 09/09/2015 and 09/16/2015. She was thought to have an active sepsis possibly from one of her decubitus ulcers but nothing was grown except for an MSSA from her thoracic spine region. CT of this area did not show any osteomyelitis. Been given IV antibiotics which included vancomycin and Zosyn in the hospital under the care of Dr. [name] the ID specialist she was sent home on oral Keflex 500 mg 3 times a day for 2 weeks. he will consider an MRI in the outpatient setting to completely rule out osteomyelitis of the spine. 10/03/2015 --readmitted to hospital on 09/23/2015 for general debility, lethargy and possible sepsis and workup was in progress. Seen by Dr. [name] and he would also like a workup for underlying malignancy as sheos had previous ultrasounds of the breasts suggesting findings of concern. Workup done so far does not suggest deep bony infection. She was treated for a pneumonia and received Zosyn and vancomycin. She had been recommended Cipro 500 twice a day and doxycycline 100 mg twice a day once she was to be [place] home. The antibiotics were to be stopped on 10/07/2015. 10/17/2015 -- patient is feeling much better and has been eating better and doing her offloading as much as possible. 10/23/2015 -- she has an appointment to see Dr. [name] tomorrow but other than that has been doing as much as possible with offloading and increasing her diet. 11/07/2015 -- she has not been here to see [place] for 2 weeks and at the present time continues to try  and eat better, work on off loading and is using the air mattress at night. She saw her PCP yesterday and she has put her back on a fentanyl patch. 11/21/2015 -- for some strange reason she has been sitting in a chair with her legs dependent for the last 6 days nonstop and has developed massive lower extremity edema and pedal edema with weeping and ulceration. 02/13/2016 -- it's been 3 weeks since I saw  her last and overall she's done well except for a right heel where she has had some new ulcerations. Electronic Signature(s) Signed: 03/26/2016 4:40:52 PM By: Judene Companion MD Entered By: Judene Companion on 03/26/2016 16:40:52 Hinckley, Zanaiya H. ([id number]) Auth, Theo H. ([id number]) -------------------------------------------------------------------------------- Physical Exam Details Patient Name: [name], [name] Date of Service: 03/26/2016 3:00 PM Medical Record Number: [id number] Patient Account Number: [id number] Date of Birth/Sex: November 07, 1945 (70 y.o. Female) Treating RN: Cornell Barman Primary Care Physician: Paulita Cradle Other Clinician: Referring Physician: Paulita Cradle Treating Physician/Extender: Benjaman Pott in Treatment: 36 Electronic Signature(s) Signed: 03/26/2016 4:40:58 PM By: Judene Companion MD Entered By: Judene Companion on 03/26/2016 16:40:58 Fill, Marvine H. ([id number]) -------------------------------------------------------------------------------- Physician Orders Details Patient Name: [name], [name] Date of Service: 03/26/2016 3:00 PM Medical Record Number: [id number] Patient Account Number: [id number] Date of Birth/Sex: June 02, 1946 (70 y.o. Female) Treating RN: Montey Hora Primary Care Physician: Paulita Cradle Other Clinician: Referring Physician: Paulita Cradle Treating Physician/Extender: Benjaman Pott in Treatment: 77 Verbal / Phone Orders: Yes Clinician: Montey Hora Read Back and Verified: Yes Diagnosis Coding Wound Cleansing Wound #11 Right,Distal Calcaneus o Clean wound with Normal Saline. Wound #15 Right,Medial Calcaneus o Clean wound with Normal Saline. Wound #3 Midline Back o Clean wound with Normal Saline. Wound #7 Right Ischial Tuberosity o Clean wound with Normal Saline. Anesthetic Wound #11 Right,Distal Calcaneus o Topical Lidocaine 4% cream applied to wound bed prior to debridement - applied in clinic  only Wound #15 Right,Medial Calcaneus o Topical Lidocaine 4% cream applied to wound bed prior to debridement - applied in clinic only Wound #3 Midline Back o Topical Lidocaine 4% cream applied to wound bed prior to debridement - applied in clinic only Wound #7 Right Ischial Tuberosity o Topical Lidocaine 4% cream applied to wound bed prior to debridement - applied in clinic only Primary Wound Dressing Wound #11 Right,Distal Calcaneus o Aquacel Ag Wound #15 Right,Medial Calcaneus o Aquacel Ag Wound #3 Midline Back o Santyl Ointment Wound #7 Right Ischial Tuberosity Zavada, Julieth H. ([id number]) o Aquacel Ag Secondary Dressing Wound #11 Right,Distal Calcaneus o Boardered Foam Dressing Wound #15 Right,Medial Calcaneus o Boardered Foam Dressing Wound #3 Midline Back o Boardered Foam Dressing Wound #7 Right Ischial Tuberosity o Boardered Foam Dressing Dressing Change Frequency Wound #11 Right,Distal Calcaneus o Change dressing every other day. Wound #15 Right,Medial Calcaneus o Change dressing every other day. Wound #3 Midline Back o Change dressing every day. Wound #7 Right Ischial Tuberosity o Change dressing every day. Follow-up Appointments Wound #11 Right,Distal Calcaneus o Return Appointment in 1 week. Wound #15 Right,Medial Calcaneus o Return Appointment in 1 week. Wound #3 Midline Back o Return Appointment in 1 week. Wound #7 Right Ischial Tuberosity o Return Appointment in 1 week. Off-Loading Wound #11 Right,Distal Calcaneus o Turn and reposition every 2 hours - Keep pressure off of back and heels Wound #15 Right,Medial Calcaneus o Turn and reposition every 2 hours - Keep pressure off of back and heels Willmon, [name] ([id number]) Wound #3 Midline Back o Turn and reposition  every 2 hours - Keep pressure off of back and heels Wound #7 Right Ischial Tuberosity o Turn and reposition every 2 hours - Keep pressure off of  back and heels Additional Orders / Instructions Wound #11 Right,Distal Calcaneus o Stop Smoking o Increase protein intake. Wound #15 Right,Medial Calcaneus o Stop Smoking o Increase protein intake. Wound #3 Midline Back o Stop Smoking o Increase protein intake. Wound #7 Right Ischial Tuberosity o Stop Smoking o Increase protein intake. Home Health Wound #11 [place] Visits - [place] Nurse may visit PRN to address patientos wound care needs. o FACE TO FACE ENCOUNTER: MEDICARE and MEDICAID PATIENTS: I certify that this patient is under my care and that I had a face-to-face encounter that meets the physician face-to-face encounter requirements with this patient on this date. The encounter with the patient was in whole or in part for the following MEDICAL CONDITION: (primary reason for [place]) MEDICAL NECESSITY: I certify, that based on my findings, NURSING services are a medically necessary home health service. HOME BOUND STATUS: I certify that my clinical findings support that this patient is homebound (i.e., Due to illness or injury, pt requires aid of supportive devices such as crutches, cane, wheelchairs, walkers, the use of special transportation or the assistance of another person to leave their place of residence. There is a normal inability to leave the home and doing so requires considerable and taxing effort. Other absences are for medical reasons / religious services and are infrequent or of short duration when for other reasons). o If current dressing causes regression in wound condition, may D/C ordered dressing product/s and apply Normal Saline Moist Dressing daily until next [place] / Other MD appointment. [place] of regression in wound condition at [phone]. o Please direct any NON-WOUND related issues/requests for orders to patient's Primary  Care Physician Wound #15 [place] Visits - [place] Nurse may visit PRN to address patientos wound care needs. Bise, [name] ([id number]) o FACE TO FACE ENCOUNTER: MEDICARE and MEDICAID PATIENTS: I certify that this patient is under my care and that I had a face-to-face encounter that meets the physician face-to-face encounter requirements with this patient on this date. The encounter with the patient was in whole or in part for the following MEDICAL CONDITION: (primary reason for [place]) MEDICAL NECESSITY: I certify, that based on my findings, NURSING services are a medically necessary home health service. HOME BOUND STATUS: I certify that my clinical findings support that this patient is homebound (i.e., Due to illness or injury, pt requires aid of supportive devices such as crutches, cane, wheelchairs, walkers, the use of special transportation or the assistance of another person to leave their place of residence. There is a normal inability to leave the home and doing so requires considerable and taxing effort. Other absences are for medical reasons / religious services and are infrequent or of short duration when for other reasons). o If current dressing causes regression in wound condition, may D/C ordered dressing product/s and apply Normal Saline Moist Dressing daily until next [place] / Other MD appointment. [place] of regression in wound condition at [phone]. o Please direct any NON-WOUND related issues/requests for orders to patient's Primary Care Physician Wound #3 Midline Back o [place] Visits - [place] Nurse may visit PRN to address patientos wound care needs. o FACE  TO FACE ENCOUNTER: MEDICARE and MEDICAID PATIENTS: I certify that this patient is under my care and that I had a face-to-face encounter that meets the physician  face-to-face encounter requirements with this patient on this date. The encounter with the patient was in whole or in part for the following MEDICAL CONDITION: (primary reason for [place]) MEDICAL NECESSITY: I certify, that based on my findings, NURSING services are a medically necessary home health service. HOME BOUND STATUS: I certify that my clinical findings support that this patient is homebound (i.e., Due to illness or injury, pt requires aid of supportive devices such as crutches, cane, wheelchairs, walkers, the use of special transportation or the assistance of another person to leave their place of residence. There is a normal inability to leave the home and doing so requires considerable and taxing effort. Other absences are for medical reasons / religious services and are infrequent or of short duration when for other reasons). o If current dressing causes regression in wound condition, may D/C ordered dressing product/s and apply Normal Saline Moist Dressing daily until next [place] / Other MD appointment. [place] of regression in wound condition at [phone]. o Please direct any NON-WOUND related issues/requests for orders to patient's Primary Care Physician Wound #7 Right Ischial [place] Visits - [place] Nurse may visit PRN to address patientos wound care needs. o FACE TO FACE ENCOUNTER: MEDICARE and MEDICAID PATIENTS: I certify that this patient is under my care and that I had a face-to-face encounter that meets the physician face-to-face encounter requirements with this patient on this date. The encounter with the patient was in whole or in part for the following MEDICAL CONDITION: (primary reason for [place]) MEDICAL NECESSITY: I certify, that based on my findings, NURSING services are a medically necessary home health service. HOME BOUND STATUS: I certify that my clinical  findings support that this patient is homebound (i.e., Due to illness or injury, pt requires aid of supportive devices such as crutches, cane, wheelchairs, walkers, the use of special Clapp, Addilynn H. ([id number]) transportation or the assistance of another person to leave their place of residence. There is a normal inability to leave the home and doing so requires considerable and taxing effort. Other absences are for medical reasons / religious services and are infrequent or of short duration when for other reasons). o If current dressing causes regression in wound condition, may D/C ordered dressing product/s and apply Normal Saline Moist Dressing daily until next [place] / Other MD appointment. [place] of regression in wound condition at [phone]. o Please direct any NON-WOUND related issues/requests for orders to patient's Primary Care Physician Electronic Signature(s) Signed: 03/26/2016 4:51:27 PM By: Montey Hora Signed: 03/26/2016 4:57:40 PM By: Judene Companion MD Entered By: Montey Hora on 03/26/2016 16:17:42 Salasar, [name] ([id number]) -------------------------------------------------------------------------------- Problem List Details Patient Name: [name], [name] Date of Service: 03/26/2016 3:00 PM Medical Record Number: [id number] Patient Account Number: [id number] Date of Birth/Sex: 1946-06-06 (70 y.o. Female) Treating RN: Cornell Barman Primary Care Physician: Paulita Cradle Other Clinician: Referring Physician: Paulita Cradle Treating Physician/Extender: Benjaman Pott in Treatment: 76 Active Problems ICD-10 Encounter Code Description Active Date Diagnosis E11.621 Type 2 diabetes mellitus with foot ulcer 03/13/2015 Yes F17.218 Nicotine dependence, cigarettes, with other nicotine- 03/13/2015 Yes induced disorders L89.100 Pressure ulcer of unspecified part of back, unstageable 05/02/2015 Yes L89.213 Pressure ulcer of  right hip, stage 3 08/05/2015 Yes  [id number] Pressure ulcer of right heel, stage 3 02/27/2016 Yes Inactive Problems Resolved Problems ICD-10 Code Description Active Date Resolved Date L89.613 Pressure ulcer of right heel, stage 3 03/13/2015 03/13/2015 L89.013 Pressure ulcer of right elbow, stage 3 03/13/2015 03/13/2015 [id number] Pressure ulcer of left hip, stage 2 05/30/2015 05/30/2015 L89.153 Pressure ulcer of sacral region, stage 3 05/30/2015 05/30/2015 Heasley, Nasim H. ([id number]) I89.0 Lymphedema, not elsewhere classified 11/21/2015 11/21/2015 L97.211 Non-pressure chronic ulcer of right calf limited to 11/21/2015 11/21/2015 breakdown of skin Electronic Signature(s) Signed: 03/26/2016 4:40:34 PM By: Judene Companion MD Entered By: Judene Companion on 03/26/2016 16:40:34 Burkemper, Nel H. ([id number]) -------------------------------------------------------------------------------- Progress Note Details Patient Name: Mccarney, [name] Date of Service: 03/26/2016 3:00 PM Medical Record Number: [id number] Patient Account Number: [id number] Date of Birth/Sex: 04/11/1946 (70 y.o. Female) Treating RN: Cornell Barman Primary Care Physician: Paulita Cradle Other Clinician: Referring Physician: Paulita Cradle Treating Physician/Extender: Benjaman Pott in Treatment: 1 Subjective Chief Complaint Information obtained from Patient Patient presents to the wound care center for a consult due non healing wound. Ulcers on the right elbow and the right heel for about 1 month. History of Present Illness (HPI) The following HPI elements were documented for the patient's wound: Location: Ulceration on the right heel and the right elbow. Quality: Patient reports experiencing a dull pain to affected area(s). Severity: Patient states wound (s) are getting better. Duration: Patient has had the wound for < 4 weeks prior to presenting for treatment Timing: Pain in wound is Intermittent (comes and goes Context: The wound  appeared gradually over time Modifying Factors: Consults to this date include:Augmentin and Bactrim and also some heel protection with duoderm Associated Signs and Symptoms: Patient reports having difficulty standing for long periods. 70 year old female with history of peripheral neuropathy, history of diet controlled diabetes mellitus type 2, history of alcoholism here for wound consult sent by her PCP Dr. Sherilyn Cooter. She has pressure ulcers at her right elbow, bilateral heels. Plain films of right calcaneus without acute bony process. Patient started by PCP on Augmentin, Bactrim as per orders, DuoDerm dressings applied - reports some improvement in her ulcer since last seen. Denies fever, chills, nausea, vomiting, diarrhea. She had a right humerus fracture in the middle of May and has had no surgery and arm is in a sling. She is also been laying in the bed for quite a while. Past medical history significant for essential hypertension, osteoporosis, peripheral neuropathy, alcoholism, ataxia, personal history of breast cancer treated with surgery chemotherapy and radiation and this was done in December 2010. she is also status post laparoscopic cholecystectomy, pilonidal cyst excision, subcutaneous port placement, partial mastectomy on the left side, skin cancer removal. 03/21/2015 -- she says overall she's been doing better and continues to smoke about 15 cigarettes a day. 03/21/2015 - her orthopedic doctor has said she may require surgery for her right humerus fracture. 04/04/2015 -- her orthopedic surgery has been scheduled for August 11. 04/18/2015 -- she is doing fine as far as her elbow and her right heel goes but she has developed some redness over prominence on her thoracic spine and wanted me to take a look at this. [place], Indiyah H. ([id number]) 05/02/2015 -- she had her surgery done and now is in a sling and support. Her back has developed a pressure injury of undetermined stage. She seems  to be in better spirits. 05/16/2015 -- last week her right heel was looking great and we had healed it out, but she has not been offloading  appropriately and has a deep tissue injury on the right heel again. The area on her right elbow has opened out with slough and the area in the thoracic spine is also getting worse. 05/30/2015 -- she has developed 2 new ulcerations one on her left ischial tuberosity and one on the sacral region. She is still working on getting up smoking but is also unable to take her vitamins as she says she develops a diarrhea when she takes vitamins. She has increased her intake of proteins. 06/06/2015 -- the patient's husband manages to get her a low air loss mattress with initiating pressure but did not know how to use it exactly and the patient was not happy about using it. Overall she says she's been feeling better. 07/11/2015 -- . Discussed a surgical opinion for debridement and application of a wound VAC, 2 weeks ago but the patient has been reluctant to get a surgical opinion as she wants to avoid surgery. 07/18/2015 -- they have an appointment to see Dr. Tamala Julian this coming Wednesday. 07/29/2015 -- they saw Dr. Tamala Julian in his office and he did a debridement of the wound and removed significant amount of slough. This is in addition to the debridement I had done previously on Friday where a large amount of the eschar was removed. He did not recommend the application of wound VAC. Addendum : Dr. Thompson Caul note was reviewed via EPIC and details noted as above. 08/05/2015 -- over the last week she has developed a another pressure injury to her right hip and has had significant discoloration of the skin and an eschar there. 08/15/2015 -- she is still smoking about a pack of cigarettes a day and does not seem to want to quit. They have not been able to talk to the vendor regarding the air mattress and I will ask them to get in touch again. She is reluctant to take vitamins and  does admit her nutrition is poor. 08/22/2015 -- she has been unable to tolerate her vitamins and continues to smoke. They're working on getting a low air loss mattress and have been speaking with the vendor. 09/19/2015 -- since her last visit and was admitted to the hospital between 09/09/2015 and 09/16/2015. She was thought to have an active sepsis possibly from one of her decubitus ulcers but nothing was grown except for an MSSA from her thoracic spine region. CT of this area did not show any osteomyelitis. Been given IV antibiotics which included vancomycin and Zosyn in the hospital under the care of Dr. [name] the ID specialist she was sent home on oral Keflex 500 mg 3 times a day for 2 weeks. he will consider an MRI in the outpatient setting to completely rule out osteomyelitis of the spine. 10/03/2015 --readmitted to hospital on 09/23/2015 for general debility, lethargy and possible sepsis and workup was in progress. Seen by Dr. [name] and he would also like a workup for underlying malignancy as she s had previous ultrasounds of the breasts suggesting findings of concern. Workup done so far does not suggest deep bony infection. She was treated for a pneumonia and received Zosyn and vancomycin. She had been recommended Cipro 500 twice a day and doxycycline 100 mg twice a day once she was to be [place] home. The antibiotics were to be stopped on 10/07/2015. 10/17/2015 -- patient is feeling much better and has been eating better and doing her offloading as much as possible. 10/23/2015 -- she has an appointment to see Dr. [name] tomorrow but  other than that has been doing as much as possible with offloading and increasing her diet. 11/07/2015 -- she has not been here to see [place] for 2 weeks and at the present time continues to try and eat better, work on off loading and is using the air mattress at night. She saw her PCP yesterday and she has put her back on a fentanyl  patch. 11/21/2015 -- for some strange reason she has been sitting in a chair with her legs dependent for the last 6 days nonstop and has developed massive lower extremity edema and pedal edema with weeping and ulceration. Alleva, [name] ([id number]) 02/13/2016 -- it's been 3 weeks since I saw her last and overall she's done well except for a right heel where she has had some new ulcerations. Multiple pressure ulcers back, heels right hip. Treat all with alginate except 4cm deep ulcer on back with Santyl Objective Constitutional Vitals Time Taken: 3:17 PM, Height: 65 in, Weight: 100 lbs, BMI: 16.6, Temperature: 98.1 F, Pulse: 108 bpm, Respiratory Rate: 18 breaths/min, Blood Pressure: 87/59 mmHg. Integumentary (Hair, Skin) Wound #11 status is Open. Original cause of wound was Pressure Injury. The wound is located on the Right,Distal Calcaneus. The wound measures 1.3cm length x 1.3cm width x 0.1cm depth; 1.327cm^2 area and 0.133cm^3 volume. The wound is limited to skin breakdown. There is no tunneling or undermining noted. There is a small amount of serous drainage noted. The wound margin is flat and intact. There is large (67-100%) red granulation within the wound bed. There is a small (1-33%) amount of necrotic tissue within the wound bed including Adherent Slough. The periwound skin appearance did not exhibit: Callus, Crepitus, Excoriation, Fluctuance, Friable, Induration, Localized Edema, Rash, Scarring, Dry/Scaly, Maceration, Moist, Atrophie Blanche, Cyanosis, Ecchymosis, Hemosiderin Staining, Mottled, Pallor, Rubor, Erythema. Periwound temperature was noted as No Abnormality. The periwound has tenderness on palpation. Wound #15 status is Open. Original cause of wound was Pressure Injury. The wound is located on the Right,Medial Calcaneus. The wound measures 0.4cm length x 0.3cm width x 0.1cm depth; 0.094cm^2 area and 0.009cm^3 volume. The wound is limited to skin breakdown. There is no  tunneling or undermining noted. There is a small amount of serous drainage noted. The wound margin is flat and intact. There is large (67-100%) red granulation within the wound bed. There is no necrotic tissue within the wound bed. The periwound skin appearance did not exhibit: Callus, Crepitus, Excoriation, Fluctuance, Friable, Induration, Localized Edema, Rash, Scarring, Dry/Scaly, Maceration, Moist, Atrophie Blanche, Cyanosis, Ecchymosis, Hemosiderin Staining, Mottled, Pallor, Rubor, Erythema. Periwound temperature was noted as No Abnormality. The periwound has tenderness on palpation. Wound #3 status is Open. Original cause of wound was Pressure Injury. The wound is located on the Midline Back. The wound measures 2cm length x 3.6cm width x 0.1cm depth; 5.655cm^2 area and 0.565cm^3 volume. The wound is limited to skin breakdown. There is no tunneling or undermining noted. The wound margin is flat and intact. There is large (67-100%) red, pink granulation within the wound bed. There is a small (1-33%) amount of necrotic tissue within the wound bed including Adherent Slough. The periwound skin appearance did not exhibit: Callus, Crepitus, Excoriation, Fluctuance, Friable, Induration, Localized Edema, Rash, Scarring, Dry/Scaly, Maceration, Moist, Atrophie Blanche, Cyanosis, Ecchymosis, Hemosiderin Staining, Mottled, Pallor, Rubor, Erythema. Periwound temperature was noted as No Achor, Ozzie H. ([id number]) Abnormality. The periwound has tenderness on palpation. Wound #7 status is Open. Original cause of wound was Pressure Injury. The wound is located on  the Right Ischial Tuberosity. The wound measures 0.2cm length x 0.6cm width x 0.4cm depth; 0.094cm^2 area and 0.038cm^3 volume. The wound is limited to skin breakdown. There is no tunneling or undermining noted. There is a medium amount of serous drainage noted. The wound margin is flat and intact. There is large (67-100%) pink granulation within  the wound bed. There is no necrotic tissue within the wound bed. The periwound skin appearance did not exhibit: Callus, Crepitus, Excoriation, Fluctuance, Friable, Induration, Localized Edema, Rash, Scarring, Dry/Scaly, Maceration, Moist, Atrophie Blanche, Cyanosis, Ecchymosis, Hemosiderin Staining, Mottled, Pallor, Rubor, Erythema. Periwound temperature was noted as No Abnormality. The periwound has tenderness on palpation. 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 Wound #15 Right,Medial Calcaneus: Clean wound with Normal Saline. Wound #3 Midline Back: Clean wound with Normal Saline. Wound #7 Right Ischial Tuberosity: Clean wound with Normal Saline. Anesthetic: Wound #11 Right,Distal Calcaneus: Topical Lidocaine 4% cream applied to wound bed prior to debridement - applied in clinic only Wound #15 Right,Medial Calcaneus: Topical Lidocaine 4% cream applied to wound bed prior to debridement - applied in clinic only Wound #3 Midline Back: Topical Lidocaine 4% cream applied to wound bed prior to debridement - applied in clinic only Wound #7 Right Ischial Tuberosity: Dickenson, Adaliz H. ([id number]) Topical Lidocaine 4% cream applied to wound bed prior to debridement - applied in clinic only Primary Wound Dressing: Wound #11 Right,Distal Calcaneus: Aquacel Ag Wound #15 Right,Medial Calcaneus: Aquacel Ag Wound #3 Midline Back: Santyl Ointment Wound #7 Right Ischial Tuberosity: Aquacel Ag Secondary Dressing: Wound #11 Right,Distal Calcaneus: Boardered Foam Dressing Wound #15 Right,Medial Calcaneus: Boardered Foam  Dressing Wound #3 Midline Back: Boardered Foam Dressing Wound #7 Right Ischial Tuberosity: Boardered Foam Dressing Dressing Change Frequency: Wound #11 Right,Distal Calcaneus: Change dressing every other day. Wound #15 Right,Medial Calcaneus: Change dressing every other day. Wound #3 Midline Back: Change dressing every day. Wound #7 Right Ischial Tuberosity: Change dressing every day. Follow-up Appointments: Wound #11 Right,Distal Calcaneus: Return Appointment in 1 week. Wound #15 Right,Medial Calcaneus: Return Appointment in 1 week. Wound #3 Midline Back: Return Appointment in 1 week. Wound #7 Right Ischial Tuberosity: Return Appointment in 1 week. Off-Loading: Wound #11 Right,Distal Calcaneus: Turn and reposition every 2 hours - Keep pressure off of back and heels Wound #15 Right,Medial Calcaneus: Turn and reposition every 2 hours - Keep pressure off of back and heels Wound #3 Midline Back: Turn and reposition every 2 hours - Keep pressure off of back and heels Wound #7 Right Ischial Tuberosity: Turn and reposition every 2 hours - Keep pressure off of back and heels Additional Orders / Instructions: Wound #11 Right,Distal Calcaneus: Stop Smoking Increase protein intake. Wound #15 Right,Medial Calcaneus: Gaetz, Cameren H. ([id number]) Stop Smoking Increase protein intake. Wound #3 Midline Back: Stop Smoking Increase protein intake. Wound #7 Right Ischial Tuberosity: Stop Smoking Increase protein intake. Home Health: Wound #11 Right,Distal Calcaneus: Continue Home Health Visits - [facility] Nurse may visit PRN to address patient s wound care needs. FACE TO FACE ENCOUNTER: MEDICARE and MEDICAID PATIENTS: I certify that this patient is under my care and that I had a face-to-face encounter that meets the physician face-to-face encounter  requirements with this patient on this date. The encounter with the patient was in whole or in part for the following MEDICAL  CONDITION: (primary reason for [place]) MEDICAL NECESSITY: I certify, that based on my findings, NURSING services are a medically necessary home health service. HOME BOUND STATUS: I certify that my clinical findings support that this patient is homebound (i.e., Due to illness or injury, pt requires aid of supportive devices such as crutches, cane, wheelchairs, walkers, the use of special transportation or the assistance of another person to leave their place of residence. There is a normal inability to leave the home and doing so requires considerable and taxing effort. Other absences are for medical reasons / religious services and are infrequent or of short duration when for other reasons). If current dressing causes regression in wound condition, may D/C ordered dressing product/s and apply Normal Saline Moist Dressing daily until next [place] / Other MD appointment. [place] of regression in wound condition at [phone]. Please direct any NON-WOUND related issues/requests for orders to patient's Primary Care Physician Wound #15 Right,Medial Calcaneus: [place] Visits - [facility] Nurse may visit PRN to address patient s wound care needs. FACE TO FACE ENCOUNTER: MEDICARE and MEDICAID PATIENTS: I certify that this patient is under my care and that I had a face-to-face encounter that meets the physician face-to-face encounter requirements with this patient on this date. The encounter with the patient was in whole or in part for the following MEDICAL CONDITION: (primary reason for [place]) MEDICAL NECESSITY: I certify, that based on my findings, NURSING services are a medically necessary home health service. HOME BOUND STATUS: I certify that my clinical findings support that this patient is homebound (i.e., Due to illness or injury, pt requires aid of supportive devices such as crutches, cane, wheelchairs, walkers, the  use of special transportation or the assistance of another person to leave their place of residence. There is a normal inability to leave the home and doing so requires considerable and taxing effort. Other absences are for medical reasons / religious services and are infrequent or of short duration when for other reasons). If current dressing causes regression in wound condition, may D/C ordered dressing product/s and apply Normal Saline Moist Dressing daily until next [place] / Other MD appointment. [place] of regression in wound condition at [phone]. Please direct any NON-WOUND related issues/requests for orders to patient's Primary Care Physician Wound #3 Midline Back: [place] Visits - [facility] Nurse may visit PRN to address patient s wound care needs. FACE TO FACE ENCOUNTER: MEDICARE and MEDICAID PATIENTS: I certify that this patient is under my care and that I had a face-to-face encounter that meets the physician face-to-face encounter requirements with this patient on this date. The encounter with the patient was in whole or in part for the following MEDICAL CONDITION: (primary reason for [place]) MEDICAL NECESSITY: I certify, that based on my findings, NURSING services are a medically necessary home health service. HOME Rosenzweig, [name] ([id number]) BOUND STATUS: I certify that my clinical findings support that this patient is homebound (i.e., Due to illness or injury, pt requires aid of supportive devices such as crutches, cane, wheelchairs, walkers, the use of special transportation or the assistance of another person to leave their place of residence. There is a normal inability to leave the home and doing so requires considerable and taxing effort. Other absences are  for medical reasons / religious services and are infrequent or of short duration when for other reasons). If current dressing causes regression in  wound condition, may D/C ordered dressing product/s and apply Normal Saline Moist Dressing daily until next [place] / Other MD appointment. [place] of regression in wound condition at [phone]. Please direct any NON-WOUND related issues/requests for orders to patient's Primary Care Physician Wound #7 Right Ischial Tuberosity: [place] Visits - [facility] Nurse may visit PRN to address patient s wound care needs. FACE TO FACE ENCOUNTER: MEDICARE and MEDICAID PATIENTS: I certify that this patient is under my care and that I had a face-to-face encounter that meets the physician face-to-face encounter requirements with this patient on this date. The encounter with the patient was in whole or in part for the following MEDICAL CONDITION: (primary reason for [place]) MEDICAL NECESSITY: I certify, that based on my findings, NURSING services are a medically necessary home health service. HOME BOUND STATUS: I certify that my clinical findings support that this patient is homebound (i.e., Due to illness or injury, pt requires aid of supportive devices such as crutches, cane, wheelchairs, walkers, the use of special transportation or the assistance of another person to leave their place of residence. There is a normal inability to leave the home and doing so requires considerable and taxing effort. Other absences are for medical reasons / religious services and are infrequent or of short duration when for other reasons). If current dressing causes regression in wound condition, may D/C ordered dressing product/s and apply Normal Saline Moist Dressing daily until next [place] / Other MD appointment. [place] of regression in wound condition at [phone]. Please direct any NON-WOUND related issues/requests for orders to patient's Primary Care Physician Follow-Up Appointments: A follow-up appointment should be  scheduled. A Patient Clinical Summary of Care was provided to AS Electronic Signature(s) Signed: 04/02/2016 12:09:17 PM By: Judene Companion MD Previous Signature: 03/26/2016 4:43:19 PM Version By: Judene Companion MD Entered By: Judene Companion on 04/02/2016 12:09:17 Kratky, Laverda H. ([id number]) -------------------------------------------------------------------------------- SuperBill Details Patient Name: [name], [name] Date of Service: 03/26/2016 Medical Record Number: [id number] Patient Account Number: [id number] Date of Birth/Sex: [birth year] (70 y.o. 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

## 2016-03-27 NOTE — Progress Notes (Signed)
HANSARD, Lashonta H. ([id number]) Visit Report for 03/26/2016 Arrival Information Details Patient Name: [name], [name] Date of Service: 03/26/2016 3:00 PM Medical Record Number: [id number] Patient Account Number: [id number] Date of Birth/Sex: 1945-10-12 (70 y.o. Female) Treating RN: Montey Hora Primary Care Physician: Paulita Cradle Other Clinician: Referring Physician: Paulita Cradle Treating Physician/Extender: Benjaman Pott in Treatment: 72 Visit Information History Since Last Visit Added or deleted any medications: No Patient Arrived: Wheel Chair Any new allergies or adverse reactions: No Arrival Time: 15:15 Had a fall or experienced change in No activities of daily living that may affect Accompanied By: spouse risk of falls: Transfer Assistance: Manual Signs or symptoms of abuse/neglect since last No Patient Identification Verified: Yes visito Secondary Verification Process Yes Hospitalized since last visit: No Completed: Pain Present Now: Yes Patient Requires Transmission-Based No Precautions: Patient Has Alerts: No Electronic Signature(s) Signed: 03/26/2016 4:51:27 PM By: Montey Hora Entered By: Montey Hora on 03/26/2016 15:16:06 Ems, Leonia H. ([id number]) -------------------------------------------------------------------------------- Clinic Level of Care Assessment Details Patient Name: [name], [name] Date of Service: 03/26/2016 3:00 PM Medical Record Number: [id number] Patient Account Number: [id number] Date of Birth/Sex: 11/27/1945 (70 y.o. Female) Treating RN: Montey Hora Primary Care Physician: Paulita Cradle Other Clinician: Referring Physician: Paulita Cradle Treating Physician/Extender: Benjaman Pott in Treatment: 28 Clinic Level of Care Assessment Items TOOL 4 Quantity Score []  - Use when only an EandM is performed on FOLLOW-UP visit 0 ASSESSMENTS - Nursing Assessment / Reassessment X - Reassessment of Co-morbidities  (includes updates in patient status) 1 10 X - Reassessment of Adherence to Treatment Plan 1 5 ASSESSMENTS - Wound and Skin Assessment / Reassessment []  - Simple Wound Assessment / Reassessment - one wound 0 X - Complex Wound Assessment / Reassessment - multiple wounds 4 5 []  - Dermatologic / Skin Assessment (not related to wound area) 0 ASSESSMENTS - Focused Assessment []  - Circumferential Edema Measurements - multi extremities 0 []  - Nutritional Assessment / Counseling / Intervention 0 []  - Lower Extremity Assessment (monofilament, tuning fork, pulses) 0 []  - Peripheral Arterial Disease Assessment (using hand held doppler) 0 ASSESSMENTS - Ostomy and/or Continence Assessment and Care []  - Incontinence Assessment and Management 0 []  - Ostomy Care Assessment and Management (repouching, etc.) 0 PROCESS - Coordination of Care X - Simple Patient / Family Education for ongoing care 1 15 []  - Complex (extensive) Patient / Family Education for ongoing care 0 []  - Staff obtains Programmer, systems, Records, Test Results / Process Orders 0 []  - Staff telephones HHA, Nursing Homes / Clarify orders / etc 0 []  - Routine Transfer to another Facility (non-emergent condition) 0 [name], [name] ([id number]) []  - Routine Hospital Admission (non-emergent condition) 0 []  - New Admissions / Biomedical engineer / Ordering NPWT, Apligraf, etc. 0 []  - Emergency Hospital Admission (emergent condition) 0 X - Simple Discharge Coordination 1 10 []  - Complex (extensive) Discharge Coordination 0 PROCESS - Special Needs []  - Pediatric / Minor Patient Management 0 []  - Isolation Patient Management 0 []  - Hearing / Language / Visual special needs 0 []  - Assessment of Community assistance (transportation, D/C planning, etc.) 0 []  - Additional assistance / Altered mentation 0 []  - Support Surface(s) Assessment (bed, cushion, seat, etc.) 0 INTERVENTIONS - Wound Cleansing / Measurement []  - Simple Wound Cleansing - one wound  0 X - Complex Wound Cleansing - multiple wounds 4 5 X - Wound Imaging (photographs - any number of wounds) 1 5 []  - Wound Tracing (instead of photographs) 0 []  -  Simple Wound Measurement - one wound 0 X - Complex Wound Measurement - multiple wounds 4 5 INTERVENTIONS - Wound Dressings X - Small Wound Dressing one or multiple wounds 4 10 []  - Medium Wound Dressing one or multiple wounds 0 []  - Large Wound Dressing one or multiple wounds 0 X - Application of Medications - topical 1 5 []  - Application of Medications - injection 0 INTERVENTIONS - Miscellaneous []  - External ear exam 0 [name], [name] ([id number]) []  - Specimen Collection (cultures, biopsies, blood, body fluids, etc.) 0 []  - Specimen(s) / Culture(s) sent or taken to Lab for analysis 0 []  - Patient Transfer (multiple staff / [name] / Similar devices) 0 []  - Simple Staple / Suture removal (25 or less) 0 []  - Complex Staple / Suture removal (26 or more) 0 []  - Hypo / Hyperglycemic Management (close monitor of Blood Glucose) 0 []  - Ankle / Brachial Index (ABI) - do not check if billed separately 0 X - Vital Signs 1 5 Has the patient been seen at the hospital within the last three years: Yes Total Score: 155 Level Of Care: New/Established - Level 4 Electronic Signature(s) Signed: 03/26/2016 4:51:27 PM By: Montey Hora Entered By: Montey Hora on 03/26/2016 15:43:59 Doane, Khloei H. ([id number]) -------------------------------------------------------------------------------- Encounter Discharge Information Details Patient Name: Mckenzie Fusi, [name] Date of Service: 03/26/2016 3:00 PM Medical Record Number: [id number] Patient Account Number: [id number] Date of Birth/Sex: Jan 25, 1946 (70 y.o. Female) Treating RN: Montey Hora Primary Care Physician: Paulita Cradle Other Clinician: Referring Physician: Paulita Cradle Treating Physician/Extender: Benjaman Pott in Treatment: 64 Encounter Discharge Information  Items Discharge Pain Level: 0 Discharge Condition: Stable Ambulatory Status: Walker Discharge Destination: Home Transportation: Private Auto Accompanied By: husband Schedule Follow-up Appointment: Yes Medication Reconciliation completed No and provided to Patient/Care Tarius Stangelo: Provided on Clinical Summary of Care: 03/26/2016 Form Type Recipient Paper Patient AS Electronic Signature(s) Signed: 03/26/2016 4:44:59 PM By: Judene Companion MD Previous Signature: 03/26/2016 4:07:30 PM Version By: Ruthine Dose Entered By: Judene Companion on 03/26/2016 16:44:59 Zachow, Suleika H. ([id number]) -------------------------------------------------------------------------------- Multi Wound Chart Details Patient Name: Mckenzie Fusi, Sakinah H. Date of Service: 03/26/2016 3:00 PM Medical Record Number: [id number] Patient Account Number: [id number] Date of Birth/Sex: 1946/04/10 (70 y.o. Female) Treating RN: Montey Hora Primary Care Physician: Paulita Cradle Other Clinician: Referring Physician: Paulita Cradle Treating Physician/Extender: Benjaman Pott in Treatment: 54 Vital Signs Height(in): 65 Pulse(bpm): 108 Weight(lbs): 100 Blood Pressure 87/59 (mmHg): Body Mass Index(BMI): 17 Temperature(F): 98.1 Respiratory Rate 18 (breaths/min): Photos: Wound Location: Right Calcaneus - Distal Right Calcaneus - Medial Back - Midline Wounding Event: Pressure Injury Pressure Injury Pressure Injury Primary Etiology: Pressure Ulcer Pressure Ulcer Pressure Ulcer Comorbid History: Cataracts, Asthma, Cataracts, Asthma, Cataracts, Asthma, Hypertension, Type II Hypertension, Type II Hypertension, Type II Diabetes, Neuropathy, Diabetes, Neuropathy, Diabetes, Neuropathy, Received Chemotherapy Received Chemotherapy Received Chemotherapy Date Acquired: 09/29/2015 02/18/2016 05/02/2015 Weeks of Treatment: 25 4 47 Wound Status: Open Open Open Clustered Wound: Yes No No Measurements L x W x D 1.3x1.3x0.1  0.4x0.3x0.1 2x3.6x0.1 (cm) Area (cm) : 1.327 0.094 5.655 Volume (cm) : 0.133 0.009 0.565 % Reduction in Area: -87.70% -49.20% -2470.50% % Reduction in Volume: -87.30% -50.00% -2468.20% Classification: Category/Stage II Category/Stage II Category/Stage III HBO Classification: Grade 1 Grade 1 N/A Exudate Amount: Small Small N/A Exudate Type: Serous Serous N/A Exudate Color: amber amber N/A Wound Margin: Flat and Intact Flat and Intact Flat and Intact Peregoy, Illyria H. ([id number]) Granulation Amount: Large (67-100%) Large (67-100%) Large (67-100%) Granulation Quality: Red  Red Red, Pink Necrotic Amount: Small (1-33%) None Present (0%) Small (1-33%) Exposed Structures: Fascia: No Fascia: No Fascia: No Fat: No Fat: No Fat: No Tendon: No Tendon: No Tendon: No Muscle: No Muscle: No Muscle: No Joint: No Joint: No Joint: No Bone: No Bone: No Bone: No Limited to Skin Limited to Skin Limited to Skin Breakdown Breakdown Breakdown Epithelialization: None None None Periwound Skin Texture: Edema: No Edema: No Edema: No Excoriation: No Excoriation: No Excoriation: No Induration: No Induration: No Induration: No Callus: No Callus: No Callus: No Crepitus: No Crepitus: No Crepitus: No Fluctuance: No Fluctuance: No Fluctuance: No Friable: No Friable: No Friable: No Rash: No Rash: No Rash: No Scarring: No Scarring: No Scarring: No Periwound Skin Maceration: No Maceration: No Maceration: No Moisture: Moist: No Moist: No Moist: No Dry/Scaly: No Dry/Scaly: No Dry/Scaly: No Periwound Skin Color: Atrophie Blanche: No Atrophie Blanche: No Atrophie Blanche: No Cyanosis: No Cyanosis: No Cyanosis: No Ecchymosis: No Ecchymosis: No Ecchymosis: No Erythema: No Erythema: No Erythema: No Hemosiderin Staining: No Hemosiderin Staining: No Hemosiderin Staining: No Mottled: No Mottled: No Mottled: No Pallor: No Pallor: No Pallor: No Rubor: No Rubor: No Rubor:  No Temperature: No Abnormality No Abnormality No Abnormality Tenderness on Yes Yes Yes Palpation: Wound Preparation: Ulcer Cleansing: Ulcer Cleansing: Ulcer Cleansing: Rinsed/Irrigated with Rinsed/Irrigated with Rinsed/Irrigated with Saline Saline Saline Topical Anesthetic Topical Anesthetic Topical Anesthetic Applied: Other: lidocaine Applied: Other: lidocaine Applied: Other: lidocaine 4% 4% 4% Wound Number: 7 N/A N/A Photos: N/A N/A Woodhead, Marlenne H. ([id number]) Wound Location: Right Ischial Tuberosity N/A N/A Wounding Event: Pressure Injury N/A N/A Primary Etiology: Pressure Ulcer N/A N/A Comorbid History: Cataracts, Asthma, N/A N/A Hypertension, Type II Diabetes, Neuropathy, Received Chemotherapy Date Acquired: 08/01/2015 N/A N/A Weeks of Treatment: 33 N/A N/A Wound Status: Open N/A N/A Clustered Wound: No N/A N/A Measurements L x W x D 0.2x0.6x0.4 N/A N/A (cm) Area (cm) : 0.094 N/A N/A Volume (cm) : 0.038 N/A N/A % Reduction in Area: 97.00% N/A N/A % Reduction in Volume: 87.90% N/A N/A Classification: Category/Stage III N/A N/A HBO Classification: N/A N/A N/A Exudate Amount: Medium N/A N/A Exudate Type: Serous N/A N/A Exudate Color: amber N/A N/A Wound Margin: Flat and Intact N/A N/A Granulation Amount: Large (67-100%) N/A N/A Granulation Quality: Pink N/A N/A Necrotic Amount: None Present (0%) N/A N/A Exposed Structures: Fascia: No N/A N/A Fat: No Tendon: No Muscle: No Joint: No Bone: No Limited to Skin Breakdown Epithelialization: None N/A N/A Periwound Skin Texture: Edema: No N/A N/A Excoriation: No Induration: No Callus: No Crepitus: No Fluctuance: No Friable: No Rash: No Scarring: No Periwound Skin Maceration: No N/A N/A Moisture: Moist: No Dry/Scaly: No Periwound Skin Color: Atrophie Blanche: No N/A N/A Cyanosis: No Eyerman, Jadin H. ([id number]) Ecchymosis: No Erythema: No Hemosiderin Staining: No Mottled: No Pallor: No Rubor:  No Temperature: No Abnormality N/A N/A Tenderness on Yes N/A N/A Palpation: Wound Preparation: Ulcer Cleansing: N/A N/A Rinsed/Irrigated with Saline Topical Anesthetic Applied: Other: lidocaine 4% Treatment Notes Electronic Signature(s) Signed: 03/26/2016 4:51:27 PM By: Montey Hora Entered By: Montey Hora on 03/26/2016 15:40:39 Secrest, Zeya H. ([id number]) -------------------------------------------------------------------------------- Multi-Disciplinary Care Plan Details Patient Name: Mckenzie Fusi, Amilee H. Date of Service: 03/26/2016 3:00 PM Medical Record Number: [id number] Patient Account Number: [id number] Date of Birth/Sex: 07/07/1946 (70 y.o. Female) Treating RN: Montey Hora Primary Care Physician: Paulita Cradle Other Clinician: Referring Physician: Paulita Cradle Treating Physician/Extender: Benjaman Pott in Treatment: 79 Active Inactive Abuse / Safety / Falls / Self Care Management  Nursing Diagnoses: Impaired home maintenance Impaired physical mobility Knowledge deficit related to: safety; personal, health (wound), emergency Potential for falls Self care deficit: actual or potential Goals: Patient will remain injury free Date Initiated: 03/13/2015 Goal Status: Active Patient/caregiver will verbalize understanding of skin care regimen Date Initiated: 03/13/2015 Goal Status: Active Patient/caregiver will verbalize/demonstrate measure taken to improve self care Date Initiated: 03/13/2015 Goal Status: Active Patient/caregiver will verbalize/demonstrate measures taken to improve the patient's personal safety Date Initiated: 03/13/2015 Goal Status: Active Patient/caregiver will verbalize/demonstrate measures taken to prevent injury and/or falls Date Initiated: 03/13/2015 Goal Status: Active Patient/caregiver will verbalize/demonstrate understanding of what to do in case of emergency Date Initiated: 03/13/2015 Goal Status: Active Interventions: Assess  fall risk on admission and as needed Assess: immobility, friction, shearing, incontinence upon admission and as needed Assess impairment of mobility on admission and as needed per policy Assess self care needs on admission and as needed Provide education on basic hygiene Wynes, Catalaya H. ([id number]) Provide education on fall prevention Provide education on personal and home safety Provide education on safe transfers Treatment Activities: Education provided on Basic Hygiene : 10/23/2015 Notes: Orientation to the Wound Care Program Nursing Diagnoses: Knowledge deficit related to the wound healing center program Goals: Patient/caregiver will verbalize understanding of the [facility] Date Initiated: 03/13/2015 Goal Status: Active Interventions: Provide education on orientation to the wound center Notes: Pressure Nursing Diagnoses: Knowledge deficit related to causes and risk factors for pressure ulcer development Knowledge deficit related to management of pressures ulcers Potential for impaired tissue integrity related to pressure, friction, moisture, and shear Goals: Patient will remain free from development of additional pressure ulcers Date Initiated: 03/13/2015 Goal Status: Active Patient will remain free of pressure ulcers Date Initiated: 03/13/2015 Goal Status: Active Patient/caregiver will verbalize risk factors for pressure ulcer development Date Initiated: 03/13/2015 Goal Status: Active Patient/caregiver will verbalize understanding of pressure ulcer management Date Initiated: 03/13/2015 Goal Status: Active Interventions: Assess: immobility, friction, shearing, incontinence upon admission and as needed Lack, Kseniya H. ([id number]) Assess offloading mechanisms upon admission and as needed Assess potential for pressure ulcer upon admission and as needed Provide education on pressure ulcers Treatment Activities: Patient referred for home evaluation of  offloading devices/mattresses : 01/02/2016 Patient referred for pressure reduction/relief devices : 01/02/2016 Patient referred for seating evaluation to ensure proper offloading : 01/02/2016 Pressure reduction/relief device ordered : 01/02/2016 Test ordered outside of clinic : 01/02/2016 Notes: Wound/Skin Impairment Nursing Diagnoses: Impaired tissue integrity Knowledge deficit related to ulceration/compromised skin integrity Goals: Patient/caregiver will verbalize understanding of skin care regimen Date Initiated: 03/13/2015 Goal Status: Active Ulcer/skin breakdown will have a volume reduction of 30% by week 4 Date Initiated: 03/13/2015 Goal Status: Active Ulcer/skin breakdown will have a volume reduction of 50% by week 8 Date Initiated: 03/13/2015 Goal Status: Active Ulcer/skin breakdown will have a volume reduction of 80% by week 12 Date Initiated: 03/13/2015 Goal Status: Active Ulcer/skin breakdown will heal within 14 weeks Date Initiated: 03/13/2015 Goal Status: Active Interventions: Assess patient/caregiver ability to obtain necessary supplies Assess patient/caregiver ability to perform ulcer/skin care regimen upon admission and as needed Assess ulceration(s) every visit Provide education on smoking Provide education on ulcer and skin care Treatment Activities: Patient referred to home care : 01/02/2016 NISWONGER, Nolie Lemmie Evens ([id number]) Referred to DME Gloria Lambertson for dressing supplies : 01/02/2016 Skin care regimen initiated : 01/02/2016 Topical wound management initiated : 01/02/2016 Notes: Electronic Signature(s) Signed: 03/26/2016 4:51:27 PM By: Montey Hora Entered By: Montey Hora on 03/26/2016 15:40:23 Wakeman,  Swati H. ([id number]) -------------------------------------------------------------------------------- Pain Assessment Details Patient Name: Harriss, Deziree H. Date of Service: 03/26/2016 3:00 PM Medical Record Number: [id number] Patient Account Number: [id number] Date of  Birth/Sex: 02/01/1946 (70 y.o. Female) Treating RN: Montey Hora Primary Care Physician: Paulita Cradle Other Clinician: Referring Physician: Paulita Cradle Treating Physician/Extender: Benjaman Pott in Treatment: 16 Active Problems Location of Pain Severity and Description of Pain Patient Has Paino Yes Site Locations Pain Location: Generalized Pain, Pain in Ulcers With Dressing Change: Yes Duration of the Pain. Constant / Intermittento Constant Pain Management and Medication Current Pain Management: Electronic Signature(s) Signed: 03/26/2016 4:51:27 PM By: Montey Hora Entered By: Montey Hora on 03/26/2016 15:17:07 Strausbaugh, Yailin H. ([id number]) -------------------------------------------------------------------------------- Patient/Caregiver Education Details Patient Name: Mckenzie Fusi, Melissia H. Date of Service: 03/26/2016 3:00 PM Medical Record Number: [id number] Patient Account Number: [id number] Date of Birth/Gender: 1945-11-20 (70 y.o. Female) Treating RN: Montey Hora Primary Care Physician: Paulita Cradle Other Clinician: Referring Physician: Paulita Cradle Treating Physician/Extender: Benjaman Pott in Treatment: 27 Education Assessment Education Provided To: Patient Education Topics Provided Wound/Skin Impairment: Handouts: Other: change dressing as ordered Methods: Demonstration, Explain/Verbal Responses: State content correctly Electronic Signature(s) Signed: 03/26/2016 4:57:40 PM By: Judene Companion MD Entered By: Judene Companion on 03/26/2016 16:45:07 Sanger, Vernella H. ([id number]) -------------------------------------------------------------------------------- Wound Assessment Details Patient Name: Houde, Kentrell H. Date of Service: 03/26/2016 3:00 PM Medical Record Number: [id number] Patient Account Number: [id number] Date of Birth/Sex: August 11, 1946 (70 y.o. Female) Treating RN: Montey Hora Primary Care Physician: Paulita Cradle Other Clinician: Referring Physician: Paulita Cradle Treating Physician/Extender: Benjaman Pott in Treatment: 54 Wound Status Wound Number: 11 Primary Pressure Ulcer Etiology: Wound Location: Right Calcaneus - Distal Wound Open Wounding Event: Pressure Injury Status: Date Acquired: 09/29/2015 Comorbid Cataracts, Asthma, Hypertension, Type Weeks Of Treatment: 25 History: II Diabetes, Neuropathy, Received Clustered Wound: Yes Chemotherapy Photos Wound Measurements Length: (cm) 1.3 Width: (cm) 1.3 Depth: (cm) 0.1 Area: (cm) 1.327 Volume: (cm) 0.133 % Reduction in Area: -87.7% % Reduction in Volume: -87.3% Epithelialization: None Tunneling: No Undermining: No Wound Description Classification: Category/Stage II Foul Odor Af Diabetic Severity (Wagner): Grade 1 Wound Margin: Flat and Intact Exudate Amount: Small Exudate Type: Serous Exudate Color: amber ter Cleansing: No Wound Bed Granulation Amount: Large (67-100%) Exposed Structure Granulation Quality: Red Fascia Exposed: No Necrotic Amount: Small (1-33%) Fat Layer Exposed: No Necrotic Quality: Adherent Slough Tendon Exposed: No Barrilleaux, Jerrianne H. ([id number]) Muscle Exposed: No Joint Exposed: No Bone Exposed: No Limited to Skin Breakdown Periwound Skin Texture Texture Color No Abnormalities Noted: No No Abnormalities Noted: No Callus: No Atrophie Blanche: No Crepitus: No Cyanosis: No Excoriation: No Ecchymosis: No Fluctuance: No Erythema: No Friable: No Hemosiderin Staining: No Induration: No Mottled: No Localized Edema: No Pallor: No Rash: No Rubor: No Scarring: No Temperature / Pain Moisture Temperature: No Abnormality No Abnormalities Noted: No Tenderness on Palpation: Yes Dry / Scaly: No Maceration: No Moist: No Wound Preparation Ulcer Cleansing: Rinsed/Irrigated with Saline Topical Anesthetic Applied: Other: lidocaine 4%, Treatment Notes Wound #11 (Right, Distal  Calcaneus) 1. Cleansed with: Clean wound with Normal Saline 2. Anesthetic Topical Lidocaine 4% cream to wound bed prior to debridement 4. Dressing Applied: Aquacel Ag 5. Secondary Dressing Applied Bordered Foam Dressing Electronic Signature(s) Signed: 03/26/2016 4:51:27 PM By: Montey Hora Entered By: Montey Hora on 03/26/2016 15:37:17 Chatham, Carrie H. ([id number]) -------------------------------------------------------------------------------- Wound Assessment Details Patient Name: Legrande, Hetal H. Date of Service: 03/26/2016 3:00 PM Medical Record Number: [id number] Patient Account Number: [id number] Date of Birth/Sex: 12-06-1945 (70 y.o. Female)  Treating RN: Montey Hora Primary Care Physician: Paulita Cradle Other Clinician: Referring Physician: Paulita Cradle Treating Physician/Extender: Benjaman Pott in Treatment: 54 Wound Status Wound Number: 15 Primary Pressure Ulcer Etiology: Wound Location: Right Calcaneus - Medial Wound Open Wounding Event: Pressure Injury Status: Date Acquired: 02/18/2016 Comorbid Cataracts, Asthma, Hypertension, Type Weeks Of Treatment: 4 History: II Diabetes, Neuropathy, Received Clustered Wound: No Chemotherapy Photos Wound Measurements Length: (cm) 0.4 Width: (cm) 0.3 Depth: (cm) 0.1 Area: (cm) 0.094 Volume: (cm) 0.009 % Reduction in Area: -49.2% % Reduction in Volume: -50% Epithelialization: None Tunneling: No Undermining: No Wound Description Classification: Category/Stage II Foul Odor A Diabetic Severity (Wagner): Grade 1 Wound Margin: Flat and Intact Exudate Amount: Small Exudate Type: Serous Exudate Color: amber fter Cleansing: No Wound Bed Granulation Amount: Large (67-100%) Exposed Structure Granulation Quality: Red Fascia Exposed: No Necrotic Amount: None Present (0%) Fat Layer Exposed: No Tendon Exposed: No Imperato, Leilyn H. ([id number]) Muscle Exposed: No Joint Exposed: No Bone Exposed:  No Limited to Skin Breakdown Periwound Skin Texture Texture Color No Abnormalities Noted: No No Abnormalities Noted: No Callus: No Atrophie Blanche: No Crepitus: No Cyanosis: No Excoriation: No Ecchymosis: No Fluctuance: No Erythema: No Friable: No Hemosiderin Staining: No Induration: No Mottled: No Localized Edema: No Pallor: No Rash: No Rubor: No Scarring: No Temperature / Pain Moisture Temperature: No Abnormality No Abnormalities Noted: No Tenderness on Palpation: Yes Dry / Scaly: No Maceration: No Moist: No Wound Preparation Ulcer Cleansing: Rinsed/Irrigated with Saline Topical Anesthetic Applied: Other: lidocaine 4%, Treatment Notes Wound #15 (Right, Medial Calcaneus) 1. Cleansed with: Clean wound with Normal Saline 2. Anesthetic Topical Lidocaine 4% cream to wound bed prior to debridement 4. Dressing Applied: Aquacel Ag 5. Secondary Dressing Applied Bordered Foam Dressing Electronic Signature(s) Signed: 03/26/2016 4:51:27 PM By: Montey Hora Entered By: Montey Hora on 03/26/2016 15:38:17 Steuck, Ladelle H. ([id number]) -------------------------------------------------------------------------------- Wound Assessment Details Patient Name: Ionescu, Pammy H. Date of Service: 03/26/2016 3:00 PM Medical Record Number: [id number] Patient Account Number: [id number] Date of Birth/Sex: Sep 07, 1946 (70 y.o. Female) Treating RN: Montey Hora Primary Care Physician: Paulita Cradle Other Clinician: Referring Physician: Paulita Cradle Treating Physician/Extender: Benjaman Pott in Treatment: 54 Wound Status Wound Number: 3 Primary Pressure Ulcer Etiology: Wound Location: Back - Midline Wound Open Wounding Event: Pressure Injury Status: Date Acquired: 05/02/2015 Comorbid Cataracts, Asthma, Hypertension, Type Weeks Of Treatment: 47 History: II Diabetes, Neuropathy, Received Clustered Wound: No Chemotherapy Photos Wound Measurements Length: (cm)  2 Width: (cm) 3.6 Depth: (cm) 0.1 Area: (cm) 5.655 Volume: (cm) 0.565 % Reduction in Area: -2470.5% % Reduction in Volume: -2468.2% Epithelialization: None Tunneling: No Undermining: No Wound Description Classification: Category/Stage III Wound Margin: Flat and Intact Wound Bed Granulation Amount: Large (67-100%) Exposed Structure Granulation Quality: Red, Pink Fascia Exposed: No Necrotic Amount: Small (1-33%) Fat Layer Exposed: No Necrotic Quality: Adherent Slough Tendon Exposed: No Muscle Exposed: No Joint Exposed: No Bone Exposed: No Limited to Skin Breakdown Schnick, Anessia H. ([id number]) Periwound Skin Texture Texture Color No Abnormalities Noted: No No Abnormalities Noted: No Callus: No Atrophie Blanche: No Crepitus: No Cyanosis: No Excoriation: No Ecchymosis: No Fluctuance: No Erythema: No Friable: No Hemosiderin Staining: No Induration: No Mottled: No Localized Edema: No Pallor: No Rash: No Rubor: No Scarring: No Temperature / Pain Moisture Temperature: No Abnormality No Abnormalities Noted: No Tenderness on Palpation: Yes Dry / Scaly: No Maceration: No Moist: No Wound Preparation Ulcer Cleansing: Rinsed/Irrigated with Saline Topical Anesthetic Applied: Other: lidocaine 4%, Treatment Notes Wound #3 (Midline Back) 1. Cleansed  with: Clean wound with Normal Saline 2. Anesthetic Topical Lidocaine 4% cream to wound bed prior to debridement 4. Dressing Applied: Santyl Ointment 5. Secondary Dressing Applied Bordered Foam Dressing Electronic Signature(s) Signed: 03/26/2016 4:51:27 PM By: Montey Hora Entered By: Montey Hora on 03/26/2016 15:39:08 Lessig, Khristina H. ([id number]) -------------------------------------------------------------------------------- Wound Assessment Details Patient Name: Enberg, Peityn H. Date of Service: 03/26/2016 3:00 PM Medical Record Number: [id number] Patient Account Number: [id number] Date of Birth/Sex: 1946-01-29  (70 y.o. 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 Wound Open Wounding Event: Pressure Injury Status: Date Acquired: 08/01/2015 Comorbid Cataracts, Asthma, Hypertension, Type Weeks Of Treatment: 33 History: II Diabetes, Neuropathy, Received Clustered Wound: No Chemotherapy Photos Wound Measurements Length: (cm) 0.2 Width: (cm) 0.6 Depth: (cm) 0.4 Area: (cm) 0.094 Volume: (cm) 0.038 % Reduction in Area: 97% % Reduction in Volume: 87.9% Epithelialization: None Tunneling: No Undermining: No Wound Description Classification: Category/Stage III Wound Margin: Flat and Intact Exudate Amount: Medium Exudate Type: Serous Exudate Color: amber Foul Odor After Cleansing: No Wound Bed Granulation Amount: Large (67-100%) Exposed Structure Granulation Quality: Pink Fascia Exposed: No Necrotic Amount: None Present (0%) Fat Layer Exposed: No Tendon Exposed: No Muscle Exposed: No Gulden, Serine H. ([id number]) Joint Exposed: No Bone Exposed: No Limited to Skin Breakdown Periwound Skin Texture Texture Color No Abnormalities Noted: No No Abnormalities Noted: No Callus: No Atrophie Blanche: No Crepitus: No Cyanosis: No Excoriation: No Ecchymosis: No Fluctuance: No Erythema: No Friable: No Hemosiderin Staining: No Induration: No Mottled: No Localized Edema: No Pallor: No Rash: No Rubor: No Scarring: No Temperature / Pain Moisture Temperature: No Abnormality No Abnormalities Noted: No Tenderness on Palpation: Yes Dry / Scaly: No Maceration: No Moist: No Wound Preparation Ulcer Cleansing: Rinsed/Irrigated with Saline Topical Anesthetic Applied: Other: lidocaine 4%, Treatment Notes Wound #7 (Right  Ischial Tuberosity) 1. Cleansed with: Clean wound with Normal Saline 2. Anesthetic Topical Lidocaine 4% cream to wound bed prior to debridement 4. Dressing Applied: Santyl Ointment 5. Secondary Dressing Applied Bordered Foam Dressing Electronic Signature(s) Signed: 03/26/2016 4:51:27 PM By: Montey Hora Entered By: Montey Hora on 03/26/2016 15:39:55 Miles, Sharnelle H. ([id number]) -------------------------------------------------------------------------------- Vitals Details Patient Name: Mckenzie Fusi, Mana H. Date of Service: 03/26/2016 3:00 PM Medical Record Number: [id number] Patient Account Number: [id number] Date of Birth/Sex: 1946-07-13 (70 y.o. Female) Treating RN: Montey Hora Primary Care Physician: Paulita Cradle Other Clinician: Referring Physician: Paulita Cradle Treating Physician/Extender: Benjaman Pott in Treatment: 38 Vital Signs Time Taken: 15:17 Temperature (F): 98.1 Height (in): 65 Pulse (bpm): 108 Weight (lbs): 100 Respiratory Rate (breaths/min): 18 Body Mass Index (BMI): 16.6 Blood Pressure (mmHg): 87/59 Reference Range: 80 - 120 mg / dl Electronic Signature(s) Signed: 03/26/2016 4:51:27 PM By: Montey Hora Entered By: Montey Hora on 03/26/2016 15:19:15

## 2016-04-02 ENCOUNTER — Encounter (HOSPITAL_BASED_OUTPATIENT_CLINIC_OR_DEPARTMENT_OTHER): Payer: Medicare Other | Admitting: General Surgery

## 2016-04-02 ENCOUNTER — Encounter: Payer: Self-pay | Admitting: General Surgery

## 2016-04-02 DIAGNOSIS — L89012 Pressure ulcer of right elbow, stage 2: Secondary | ICD-10-CM

## 2016-04-02 DIAGNOSIS — E11621 Type 2 diabetes mellitus with foot ulcer: Secondary | ICD-10-CM | POA: Diagnosis not present

## 2016-04-02 DIAGNOSIS — L89113 Pressure ulcer of right upper back, stage 3: Secondary | ICD-10-CM

## 2016-04-02 DIAGNOSIS — L89622 Pressure ulcer of left heel, stage 2: Secondary | ICD-10-CM

## 2016-04-02 DIAGNOSIS — L8994 Pressure ulcer of unspecified site, stage 4: Secondary | ICD-10-CM | POA: Diagnosis not present

## 2016-04-02 DIAGNOSIS — L089 Local infection of the skin and subcutaneous tissue, unspecified: Secondary | ICD-10-CM

## 2016-04-02 DIAGNOSIS — L89612 Pressure ulcer of right heel, stage 2: Secondary | ICD-10-CM

## 2016-04-02 NOTE — Progress Notes (Signed)
Debrided dead skin and fat from back.. Dress with Santyl.  Feet did not require debriding.  Dress feet with [employer]

## 2016-04-03 NOTE — Progress Notes (Addendum)
[name] Visit Report for 04/02/2016 Arrival Information Details Patient Name: [name] Date of Service: 04/02/2016 3:45 PM Medical Record Number: [id number] Patient Account Number: [id number] Date of Birth/Sex: 26-Oct-1945 (70 y.o. Female) Treating RN: Catalina Lunger Primary Care Physician: Paulita Cradle Other Clinician: Referring Physician: Paulita Cradle Treating Physician/Extender: Benjaman Pott in Treatment: 15 Visit Information History Since Last Visit All ordered tests and consults were completed: No Patient Arrived: Walker Added or deleted any medications: No Arrival Time: 15:56 Any new allergies or adverse reactions: No Accompanied By: spouse Had a fall or experienced change in No Transfer Assistance: None activities of daily living that may affect Patient Identification Verified: Yes risk of falls: Secondary Verification Process Completed: Yes Signs or symptoms of abuse/neglect since last No Patient Requires Transmission-Based No visito Precautions: Hospitalized since last visit: No Patient Has Alerts: No Pain Present Now: No Electronic Signature(s) Signed: 04/02/2016 4:31:17 PM By: Catalina Lunger Entered By: Catalina Lunger on 04/02/2016 15:59:40 [name] -------------------------------------------------------------------------------- Encounter Discharge Information Details Patient Name: [name] Date of Service: 04/02/2016 3:45 PM Medical Record Number: [id number] Patient Account Number: [id number] Date of Birth/Sex: 1945-09-30 (70 y.o. Female) Treating RN: Catalina Lunger Primary Care Physician: Paulita Cradle Other Clinician: Referring Physician: Paulita Cradle Treating Physician/Extender: Benjaman Pott in Treatment: 89 Encounter Discharge Information Items Discharge Pain Level: 0 Discharge Condition: Stable Ambulatory Status: Walker Discharge Destination:  Home Private Transportation: Auto Accompanied By: spouse Schedule Follow-up Appointment: Yes Medication Reconciliation completed and No provided to Patient/Care [place]: Clinical Summary of Care: Electronic Signature(s) Signed: 04/02/2016 4:41:40 PM By: Judene Companion MD Previous Signature: 04/02/2016 4:31:17 PM Version By: Catalina Lunger Previous Signature: 04/02/2016 4:29:32 PM Version By: Ruthine Dose Entered By: Judene Companion on 04/02/2016 16:41:40 [name] -------------------------------------------------------------------------------- Lower Extremity Assessment Details Patient Name: [name] Date of Service: 04/02/2016 3:45 PM Medical Record Number: [id number] Patient Account Number: [id number] Date of Birth/Sex: [birth year] (70 y.o. Female) Treating RN: Catalina Lunger Primary Care Physician: Paulita Cradle Other Clinician: Referring Physician: Paulita Cradle Treating Physician/Extender: Benjaman Pott in Treatment: 55 Electronic Signature(s) Signed: 04/02/2016 4:31:17 PM By: Catalina Lunger Entered By: Catalina Lunger on 04/02/2016 16:01:58 [name] -------------------------------------------------------------------------------- Multi Wound Chart Details Patient Name: [name] Date of Service: 04/02/2016 3:45 PM Medical Record Number: [id number] Patient Account Number: [id number] Date of Birth/Sex: [birth year] (70 y.o. Female) Treating RN: Catalina Lunger Primary Care Physician: Paulita Cradle Other Clinician: Referring Physician: Paulita Cradle Treating Physician/Extender: Benjaman Pott in Treatment: 55 Vital Signs Height(in): 65 Pulse(bpm): 114 Weight(lbs): 100 Blood Pressure 106/67 (mmHg): Body Mass Index(BMI): 17 Temperature(F): 98.6 Respiratory Rate 20 (breaths/min): Photos: [11:No Photos] [15:No Photos] [3:No Photos] Wound Location: [11:Right Calcaneus - Distal] [15:Right, Medial  Calcaneus] [3:Midline Back] Wounding Event: [11:Pressure Injury] [15:Pressure Injury] [3:Pressure Injury] Primary Etiology: [11:Pressure Ulcer] [15:Pressure Ulcer] [3:Pressure Ulcer] Comorbid History: [11:Cataracts, Asthma, Hypertension, Type II Diabetes, Neuropathy, Received Chemotherapy] [15:N/A] [3:N/A] Date Acquired: [11:09/29/2015] [15:02/18/2016] [3:05/02/2015] Weeks of Treatment: [11:26] [15:5] [3:48] Wound Status: [11:Open] [15:Open] [3:Open] Clustered Wound: [11:Yes] [15:No] [3:No] Measurements L x W x D 1.5x1.6x0.1 [15:0.5x0.1x0.1] [3:2.2x4x0.3] (cm) Area (cm) : [11:1.885] [15:0.039] [3:6.912] Volume (cm) : [11:0.188] [15:0.004] [3:2.073] % Reduction in Area: [11:-166.60%] [15:38.10%] [3:-3041.80%] % Reduction in Volume: -164.80% [15:33.30%] [3:-9322.70%] Classification: [11:Category/Stage II] [15:Category/Stage II] [3:Category/Stage III] HBO Classification: [11:Grade 1] [15:N/A] [3:N/A] Exudate Amount: [11:Small] [15:N/A] [3:N/A] Exudate Type: [11:Serous] [15:N/A] [3:N/A] Exudate Color: [11:amber] [15:N/A] [3:N/A] Wound Margin: [11:Flat and Intact] [15:N/A] [3:N/A] Granulation Amount: [11:Large (  67-100%)] [15:N/A] [3:N/A] Necrotic Amount: [11:Small (1-33%)] [15:N/A] [3:N/A] Exposed Structures: [11:Fascia: No Fat: No Tendon: No Muscle: No] [15:N/A] [3:N/A] Joint: No Bone: No Limited to Skin Breakdown Epithelialization: None N/A N/A Periwound Skin Texture: Edema: Yes No Abnormalities Noted No Abnormalities Noted Excoriation: No Induration: No Callus: No Crepitus: No Fluctuance: No Friable: No Rash: No Scarring: No Periwound Skin Dry/Scaly: Yes No Abnormalities Noted No Abnormalities Noted Moisture: Maceration: No Moist: No Periwound Skin Color: Atrophie Blanche: No No Abnormalities Noted No Abnormalities Noted Cyanosis: No Ecchymosis: No Erythema: No Hemosiderin Staining: No Mottled: No Pallor: No Rubor: No Temperature: No Abnormality N/A N/A Tenderness on  Yes No No Palpation: Wound Preparation: Ulcer Cleansing: N/A N/A Rinsed/Irrigated with Saline Topical Anesthetic Applied: Other: lidocaine 4% Wound Number: 7 N/A N/A Photos: No Photos N/A N/A Wound Location: Right Ischial Tuberosity N/A N/A Wounding Event: Pressure Injury N/A N/A Primary Etiology: Pressure Ulcer N/A N/A Comorbid History: N/A N/A N/A Date Acquired: 08/01/2015 N/A N/A Weeks of Treatment: 34 N/A N/A Wound Status: Open N/A N/A Clustered Wound: No N/A N/A Measurements L x W x D 0.2x0.5x0.2 N/A N/A (cm) Area (cm) : 0.079 N/A N/A Volume (cm) : 0.016 N/A N/A [name] ([id number]) % Reduction in Area: 97.50% N/A N/A % Reduction in Volume: 94.90% N/A N/A Classification: Category/Stage III N/A N/A HBO Classification: N/A N/A N/A Exudate Amount: N/A N/A N/A Exudate Type: N/A N/A N/A Exudate Color: N/A N/A N/A Wound Margin: N/A N/A N/A Granulation Amount: N/A N/A N/A Necrotic Amount: N/A N/A N/A Exposed Structures: N/A N/A N/A Epithelialization: N/A N/A N/A Periwound Skin Texture: No Abnormalities Noted N/A N/A Periwound Skin No Abnormalities Noted N/A N/A Moisture: Periwound Skin Color: No Abnormalities Noted N/A N/A Temperature: N/A N/A N/A Tenderness on No N/A N/A Palpation: Wound Preparation: N/A N/A N/A Treatment Notes Electronic Signature(s) Signed: 04/02/2016 4:31:17 PM By: Catalina Lunger Entered By: Catalina Lunger on 04/02/2016 16:12:05 [name] ([id number]) -------------------------------------------------------------------------------- [place] Details Patient Name: [name], Landry [name]. Date of Service: 04/02/2016 3:45 PM Medical Record Number: [id number] Patient Account Number: [id number] Date of Birth/Sex: [birth year] (70 y.o. Female) Treating RN: Catalina Lunger Primary Care Physician: Paulita Cradle Other Clinician: Referring Physician: Paulita Cradle Treating Physician/Extender: Benjaman Pott in  Treatment: 72 Active Inactive Abuse / Safety / Falls / Self Care Management Nursing Diagnoses: Impaired home maintenance Impaired physical mobility Knowledge deficit related to: safety; personal, health (wound), emergency Potential for falls Self care deficit: actual or potential Goals: Patient will remain injury free Date Initiated: 03/13/2015 Goal Status: Active Patient/caregiver will verbalize understanding of skin care regimen Date Initiated: 03/13/2015 Goal Status: Active Patient/caregiver will verbalize/demonstrate measure taken to improve self care Date Initiated: 03/13/2015 Goal Status: Active Patient/caregiver will verbalize/demonstrate measures taken to improve the patient's personal safety Date Initiated: 03/13/2015 Goal Status: Active Patient/caregiver will verbalize/demonstrate measures taken to prevent injury and/or falls Date Initiated: 03/13/2015 Goal Status: Active Patient/caregiver will verbalize/demonstrate understanding of what to do in case of emergency Date Initiated: 03/13/2015 Goal Status: Active Interventions: Assess fall risk on admission and as needed Assess: immobility, friction, shearing, incontinence upon admission and as needed Assess impairment of mobility on admission and as needed per policy Assess self care needs on admission and as needed Provide education on basic hygiene Duclos, [place]. ([id number]) Provide education on fall prevention Provide education on personal and home safety Provide education on safe transfers Treatment Activities: Education provided on Basic Hygiene : 08/05/2015 Notes: Pressure Nursing Diagnoses: Knowledge deficit related to  causes and risk factors for pressure ulcer development Knowledge deficit related to management of pressures ulcers Potential for impaired tissue integrity related to pressure, friction, moisture, and shear Goals: Patient will remain free from development of additional pressure ulcers Date  Initiated: 03/13/2015 Goal Status: Active Patient will remain free of pressure ulcers Date Initiated: 03/13/2015 Goal Status: Active Patient/caregiver will verbalize risk factors for pressure ulcer development Date Initiated: 03/13/2015 Goal Status: Active Patient/caregiver will verbalize understanding of pressure ulcer management Date Initiated: 03/13/2015 Goal Status: Active Interventions: Assess: immobility, friction, shearing, incontinence upon admission and as needed Assess offloading mechanisms upon admission and as needed Assess potential for pressure ulcer upon admission and as needed Provide education on pressure ulcers Treatment Activities: Patient referred for home evaluation of offloading devices/mattresses : 01/02/2016 Patient referred for pressure reduction/relief devices : 01/02/2016 Patient referred for seating evaluation to ensure proper offloading : 01/02/2016 Pressure reduction/relief device ordered : 01/02/2016 Test ordered outside of clinic : 01/02/2016 Notes: Wound/Skin Impairment [name] ([id number]) Nursing Diagnoses: Impaired tissue integrity Knowledge deficit related to ulceration/compromised skin integrity Goals: Patient/caregiver will verbalize understanding of skin care regimen Date Initiated: 03/13/2015 Goal Status: Active Ulcer/skin breakdown will have a volume reduction of 30% by week 4 Date Initiated: 03/13/2015 Goal Status: Active Ulcer/skin breakdown will have a volume reduction of 50% by week 8 Date Initiated: 03/13/2015 Goal Status: Active Ulcer/skin breakdown will have a volume reduction of 80% by week 12 Date Initiated: 03/13/2015 Goal Status: Active Ulcer/skin breakdown will heal within 14 weeks Date Initiated: 03/13/2015 Goal Status: Active Interventions: Assess patient/caregiver ability to obtain necessary supplies Assess patient/caregiver ability to perform ulcer/skin care regimen upon admission and as needed Assess ulceration(s)  every visit Provide education on smoking Provide education on ulcer and skin care Treatment Activities: Patient referred to home care : 01/02/2016 Referred to DME Jadd Gasior for dressing supplies : 01/02/2016 Skin care regimen initiated : 01/02/2016 Topical wound management initiated : 01/02/2016 Notes: Electronic Signature(s) Signed: 04/02/2016 4:31:17 PM By: Catalina Lunger Entered By: Catalina Lunger on 04/02/2016 16:11:55 Froh, [name] ([id number]) -------------------------------------------------------------------------------- Pain Assessment Details Patient Name: [name], Janann [name]. Date of Service: 04/02/2016 3:45 PM Medical Record Number: [id number] Patient Account Number: [id number] Date of Birth/Sex: May 14, 1946 (70 y.o. Female) Treating RN: Catalina Lunger Primary Care Physician: Paulita Cradle Other Clinician: Referring Physician: Paulita Cradle Treating Physician/Extender: Benjaman Pott in Treatment: 67 Active Problems Location of Pain Severity and Description of Pain Patient Has Paino No Site Locations Rate the pain. Current Pain Level: 0 Pain Management and Medication Current Pain Management: Notes pt does have chronic back pain; she uses oxycodone Electronic Signature(s) Signed: 04/02/2016 4:31:17 PM By: Catalina Lunger Entered By: Catalina Lunger on 04/02/2016 16:00:18 Langi, Marily [name]. ([id number]) -------------------------------------------------------------------------------- Patient/Caregiver Education Details Patient Name: [name], Winnifred [name]. Date of Service: 04/02/2016 3:45 PM Medical Record Number: [id number] Patient Account Number: [id number] Date of Birth/Gender: Oct 22, 1945 (70 y.o. Female) Treating RN: Catalina Lunger Primary Care Physician: Paulita Cradle Other Clinician: Referring Physician: Paulita Cradle Treating Physician/Extender: Benjaman Pott in Treatment: 35 Education Assessment Education Provided To: Patient and  Caregiver Education Topics Provided Wound/Skin Impairment: Handouts: Caring for Your Ulcer, Skin Care Do's and Dont's Methods: Explain/Verbal Responses: State content correctly Electronic Signature(s) Unsigned Previous Signature: 04/02/2016 4:31:17 PM Version By: Catalina Lunger Entered By: Judene Companion on 04/02/2016 16:41:49 Signature(s): Date(s): Plotner, Walida [name]. ([id number]) -------------------------------------------------------------------------------- Wound Assessment Details Patient Name: Kallen, Kenzlee [name]. Date of Service: 04/02/2016 3:45 PM Medical Record Number: [id number] Patient Account Number: [id number] Date  of Birth/Sex: 15-Jul-1946 (70 y.o. Female) Treating RN: Catalina Lunger Primary Care Physician: Paulita Cradle Other Clinician: Referring Physician: Paulita Cradle Treating Physician/Extender: Benjaman Pott in Treatment: 55 Wound Status Wound Number: 11 Primary Pressure Ulcer Etiology: Wound Location: Right Calcaneus - Distal Wound Open Wounding Event: Pressure Injury Status: Date Acquired: 09/29/2015 Comorbid Cataracts, Asthma, Hypertension, Type Weeks Of Treatment: 26 History: II Diabetes, Neuropathy, Received Clustered Wound: Yes Chemotherapy Wound Measurements Length: (cm) 1.5 Width: (cm) 1.6 Depth: (cm) 0.1 Area: (cm) 1.885 Volume: (cm) 0.188 % Reduction in Area: -166.6% % Reduction in Volume: -164.8% Epithelialization: None Tunneling: No Undermining: No Wound Description Classification: Category/Stage II Foul Odor Af Diabetic Severity (Wagner): Grade 1 Wound Margin: Flat and Intact Exudate Amount: Small Exudate Type: Serous Exudate Color: amber ter Cleansing: No Wound Bed Granulation Amount: Large (67-100%) Exposed Structure Granulation Quality: Hyper-granulation Fascia Exposed: No Necrotic Amount: Small (1-33%) Fat Layer Exposed: No Necrotic Quality: Adherent Slough Tendon Exposed: No Muscle Exposed: No Joint Exposed:  No Bone Exposed: No Limited to Skin Breakdown Periwound Skin Texture Texture Color No Abnormalities Noted: No No Abnormalities Noted: No Callus: No Atrophie Blanche: No Crepitus: No Cyanosis: No Rider, Rhodia [name]. ([id number]) Excoriation: No Ecchymosis: No Fluctuance: No Erythema: No Friable: No Hemosiderin Staining: No Induration: No Mottled: No Localized Edema: Yes Pallor: No Rash: No Rubor: No Scarring: No Temperature / Pain Moisture Temperature: No Abnormality No Abnormalities Noted: No Tenderness on Palpation: Yes Dry / Scaly: Yes Maceration: No Moist: No Wound Preparation Ulcer Cleansing: Rinsed/Irrigated with Saline Topical Anesthetic Applied: Other: lidocaine 4%, Treatment Notes Wound #11 (Right, Distal Calcaneus) 1. Cleansed with: Clean wound with Normal Saline 2. Anesthetic Topical Lidocaine 4% cream to wound bed prior to debridement 4. Dressing Applied: Aquacel Ag 5. Secondary Dressing Applied Bordered Foam Dressing Electronic Signature(s) Signed: 04/02/2016 4:31:17 PM By: Catalina Lunger Entered By: Catalina Lunger on 04/02/2016 16:10:39 Windish, Zaniyah [name]. ([id number]) -------------------------------------------------------------------------------- Wound Assessment Details Patient Name: Garbers, Zooey [name]. Date of Service: 04/02/2016 3:45 PM Medical Record Number: [id number] Patient Account Number: [id number] Date of Birth/Sex: May 30, 1946 (70 y.o. Female) Treating RN: Catalina Lunger Primary Care Physician: Paulita Cradle Other Clinician: Referring Physician: Paulita Cradle Treating Physician/Extender: Benjaman Pott in Treatment: 55 Wound Status Wound Number: 15 Primary Pressure Ulcer Etiology: Wound Location: Right Calcaneus - Medial Wound Open Wounding Event: Pressure Injury Status: Date Acquired: 02/18/2016 Comorbid Cataracts, Asthma, Hypertension, Type Weeks Of Treatment: 5 History: II Diabetes, Neuropathy, Received Clustered Wound:  No Chemotherapy Wound Measurements Length: (cm) 0.5 Width: (cm) 0.1 Depth: (cm) 0.1 Area: (cm) 0.039 Volume: (cm) 0.004 % Reduction in Area: 38.1% % Reduction in Volume: 33.3% Epithelialization: None Wound Description Classification: Category/Stage II Foul Odor Diabetic Severity (Wagner): Grade 1 Wound Margin: Flat and Intact Exudate Amount: Small Exudate Type: Serous Exudate Color: amber After Cleansing: No Wound Bed Granulation Amount: Large (67-100%) Exposed Structure Granulation Quality: Red Fascia Exposed: No Necrotic Amount: None Present (0%) Fat Layer Exposed: No Tendon Exposed: No Muscle Exposed: No Joint Exposed: No Bone Exposed: No Limited to Skin Breakdown Periwound Skin Texture Texture Color No Abnormalities Noted: No No Abnormalities Noted: No Callus: No Atrophie Blanche: No Crepitus: No Cyanosis: No Fretwell, Porsha [name]. ([id number]) Excoriation: No Ecchymosis: No Fluctuance: No Erythema: No Friable: No Hemosiderin Staining: No Induration: No Mottled: No Localized Edema: No Pallor: No Rash: No Rubor: No Scarring: No Temperature / Pain Moisture Temperature: No Abnormality No Abnormalities Noted: No Tenderness on Palpation: Yes Dry / Scaly: No Maceration: No Moist: No Wound Preparation  Ulcer Cleansing: Rinsed/Irrigated with Saline Topical Anesthetic Applied: Other: lidocaine 4%, Treatment Notes Wound #15 (Right, Medial Calcaneus) 1. Cleansed with: Clean wound with Normal Saline 2. Anesthetic Topical Lidocaine 4% cream to wound bed prior to debridement 4. Dressing Applied: Aquacel Ag 5. Secondary Dressing Applied Bordered Foam Dressing Electronic Signature(s) Signed: 04/02/2016 4:31:17 PM By: Catalina Lunger Entered By: Catalina Lunger on 04/02/2016 16:16:44 [place], Lorayne [name]. ([id number]) -------------------------------------------------------------------------------- Wound Assessment Details Patient Name: Ryner, Rylah [name]. Date of  Service: 04/02/2016 3:45 PM Medical Record Number: [id number] Patient Account Number: [id number] Date of Birth/Sex: Sep 05, 1946 (70 y.o. Female) Treating RN: Catalina Lunger Primary Care Physician: Paulita Cradle Other Clinician: Referring Physician: Paulita Cradle Treating Physician/Extender: Benjaman Pott in Treatment: 55 Wound Status Wound Number: 3 Primary Pressure Ulcer Etiology: Wound Location: Back - Midline Wound Open Wounding Event: Pressure Injury Status: Date Acquired: 05/02/2015 Comorbid Cataracts, Asthma, Hypertension, Type Weeks Of Treatment: 48 History: II Diabetes, Neuropathy, Received Clustered Wound: No Chemotherapy Wound Measurements Length: (cm) 2.2 Width: (cm) 4 Depth: (cm) 0.3 Area: (cm) 6.912 Volume: (cm) 2.073 % Reduction in Area: -3041.8% % Reduction in Volume: -9322.7% Epithelialization: None Wound Description Classification: Category/Stage III Wound Margin: Flat and Intact Wound Bed Granulation Amount: Large (67-100%) Exposed Structure Granulation Quality: Red, Pink Fascia Exposed: No Necrotic Amount: Small (1-33%) Fat Layer Exposed: No Necrotic Quality: Adherent Slough Tendon Exposed: No Muscle Exposed: No Joint Exposed: No Bone Exposed: No Limited to Skin Breakdown Periwound Skin Texture Texture Color No Abnormalities Noted: No No Abnormalities Noted: No Callus: No Atrophie Blanche: No Crepitus: No Cyanosis: No Excoriation: No Ecchymosis: No Fluctuance: No Erythema: No Friable: No Hemosiderin Staining: No Induration: No Mottled: No Simons, Narmeen [name]. ([id number]) Localized Edema: No Pallor: No Rash: No Rubor: No Scarring: No Temperature / Pain Moisture Temperature: No Abnormality No Abnormalities Noted: No Tenderness on Palpation: Yes Dry / Scaly: No Maceration: No Moist: No Wound Preparation Ulcer Cleansing: Rinsed/Irrigated with Saline Topical Anesthetic Applied: Other: lidocaine 4%, Treatment  Notes Wound #3 (Midline Back) 1. Cleansed with: Clean wound with Normal Saline May Shower, gently pat wound dry prior to applying new dressing. 2. Anesthetic Topical Lidocaine 4% cream to wound bed prior to debridement 4. Dressing Applied: Santyl Ointment 5. Secondary Dressing Applied Bordered Foam Dressing Electronic Signature(s) Signed: 04/02/2016 4:31:17 PM By: Catalina Lunger Entered By: Catalina Lunger on 04/02/2016 16:17:11 Dubuc, Jevaeh [name]. ([id number]) -------------------------------------------------------------------------------- Wound Assessment Details Patient Name: [place], Dellanira [name]. Date of Service: 04/02/2016 3:45 PM Medical Record Number: [id number] Patient Account Number: [id number] Date of Birth/Sex: May 03, 1946 (70 y.o. Female) Treating RN: Catalina Lunger Primary Care Physician: Paulita Cradle Other Clinician: Referring Physician: Paulita Cradle Treating Physician/Extender: Benjaman Pott in Treatment: 69 Wound Status Wound Number: 7 Primary Pressure Ulcer Etiology: Wound Location: Right Ischial Tuberosity Wound Open Wounding Event: Pressure Injury Status: Date Acquired: 08/01/2015 Comorbid Cataracts, Asthma, Hypertension, Type Weeks Of Treatment: 34 History: II Diabetes, Neuropathy, Received Clustered Wound: No Chemotherapy Wound Measurements Length: (cm) 0.2 Width: (cm) 0.5 Depth: (cm) 0.2 Area: (cm) 0.079 Volume: (cm) 0.016 % Reduction in Area: 97.5% % Reduction in Volume: 94.9% Epithelialization: None Wound Description Classification: Category/Stage III Wound Margin: Flat and Intact Exudate Amount: Medium Exudate Type: Serous Exudate Color: amber Foul Odor After Cleansing: No Wound Bed Granulation Amount: Large (67-100%) Exposed Structure Granulation Quality: Pink Fascia Exposed: No Necrotic Amount: None Present (0%) Fat Layer Exposed: No Tendon Exposed: No Muscle Exposed: No Joint Exposed: No Bone Exposed: No Limited to  Skin Breakdown Periwound Skin Texture Texture  Color No Abnormalities Noted: No No Abnormalities Noted: No Callus: No Atrophie Blanche: No Crepitus: No Cyanosis: No Excoriation: No Ecchymosis: No Bale, Bernisha [name]. ([id number]) Fluctuance: No Erythema: No Friable: No Hemosiderin Staining: No Induration: No Mottled: No Localized Edema: No Pallor: No Rash: No Rubor: No Scarring: No Temperature / Pain Moisture Temperature: No Abnormality No Abnormalities Noted: No Tenderness on Palpation: Yes Dry / Scaly: No Maceration: No Moist: No Wound Preparation Ulcer Cleansing: Rinsed/Irrigated with Saline Topical Anesthetic Applied: Other: lidocaine 4%, Treatment Notes Wound #7 (Right Ischial Tuberosity) 1. Cleansed with: Clean wound with Normal Saline 2. Anesthetic Topical Lidocaine 4% cream to wound bed prior to debridement 4. Dressing Applied: Aquacel Ag 5. Secondary Dressing Applied Bordered Foam Dressing Electronic Signature(s) Signed: 04/02/2016 4:31:17 PM By: Catalina Lunger Entered By: Catalina Lunger on 04/02/2016 16:17:22 Reiger, Dany [name]. ([id number]) -------------------------------------------------------------------------------- Vitals Details Patient Name: [name], Nicolas [name]. Date of Service: 04/02/2016 3:45 PM Medical Record Number: [id number] Patient Account Number: [id number] Date of Birth/Sex: Feb 09, 1946 (70 y.o. Female) Treating RN: Catalina Lunger Primary Care Physician: Paulita Cradle Other Clinician: Referring Physician: Paulita Cradle Treating Physician/Extender: Benjaman Pott in Treatment: 55 Vital Signs Time Taken: 16:00 Temperature (F): 98.6 Height (in): 65 Pulse (bpm): 114 Weight (lbs): 100 Respiratory Rate (breaths/min): 20 Body Mass Index (BMI): 16.6 Blood Pressure (mmHg): 106/67 Reference Range: 80 - 120 mg / dl Electronic Signature(s) Signed: 04/02/2016 4:31:17 PM By: Catalina Lunger Entered By: Catalina Lunger on 04/02/2016  16:01:53

## 2016-04-09 ENCOUNTER — Encounter: Payer: Medicare Other | Admitting: Surgery

## 2016-04-09 DIAGNOSIS — E11621 Type 2 diabetes mellitus with foot ulcer: Secondary | ICD-10-CM | POA: Diagnosis not present

## 2016-04-10 NOTE — Progress Notes (Addendum)
MELNICHUK, Bowie H. ([id number]) Visit Report for 04/09/2016 Arrival Information Details Patient Name: PLYMIRE, Amariyah H. Date of Service: 04/09/2016 3:00 PM Medical Record Number: [id number] Patient Account Number: [id number] Date of Birth/Sex: [birth year] (70 y.o. Female) Treating RN: Montey Hora Primary Care Physician: Paulita Cradle Other Clinician: Referring Physician: Paulita Cradle Treating Physician/Extender: Frann Rider in Treatment: 80 Visit Information History Since Last Visit Added or deleted any medications: No Patient Arrived: Wheel Chair Any new allergies or adverse reactions: No Arrival Time: 15:13 Had a fall or experienced change in No activities of daily living that may affect Accompanied By: spouse risk of falls: Transfer Assistance: Manual Signs or symptoms of abuse/neglect since last No Patient Identification Verified: Yes visito Secondary Verification Process Yes Hospitalized since last visit: No Completed: Pain Present Now: Yes Patient Requires Transmission-Based No Precautions: Patient Has Alerts: No Electronic Signature(s) Signed: 04/09/2016 4:48:23 PM By: Montey Hora Entered By: Montey Hora on 04/09/2016 15:17:06 Starlin, Emylee H. ([id number]) -------------------------------------------------------------------------------- Encounter Discharge Information Details Patient Name: [name], Phillipa H. Date of Service: 04/09/2016 3:00 PM Medical Record Number: [id number] Patient Account Number: [id number] Date of Birth/Sex: 09-24-1945 (70 y.o. Female) Treating RN: Montey Hora Primary Care Physician: Paulita Cradle Other Clinician: Referring Physician: Paulita Cradle Treating Physician/Extender: Frann Rider in Treatment: 1 Encounter Discharge Information Items Discharge Pain Level: 0 Discharge Condition: Stable Ambulatory Status: Wheelchair Discharge Destination: Home Transportation: Private Auto Accompanied By:  spouse Schedule Follow-up Appointment: Yes Medication Reconciliation completed and provided to Patient/Care No Dionta Larke: Provided on Clinical Summary of Care: 04/09/2016 Form Type Recipient Paper Patient AS Electronic Signature(s) Signed: 04/09/2016 4:14:19 PM By: Montey Hora Previous Signature: 04/09/2016 4:07:23 PM Version By: Ruthine Dose Entered By: Montey Hora on 04/09/2016 16:14:19 Name, Courtni H. ([id number]) -------------------------------------------------------------------------------- Multi Wound Chart Details Patient Name: [name], Lillyn H. Date of Service: 04/09/2016 3:00 PM Medical Record Number: [id number] Patient Account Number: [id number] Date of Birth/Sex: 1946-05-28 (70 y.o. Female) Treating RN: Montey Hora Primary Care Physician: Paulita Cradle Other Clinician: Referring Physician: Paulita Cradle Treating Physician/Extender: Frann Rider in Treatment: 56 Vital Signs Height(in): 65 Pulse(bpm): 108 Weight(lbs): 100 Blood Pressure 89/60 (mmHg): Body Mass Index(BMI): 17 Temperature(F): 98.2 Respiratory Rate 18 (breaths/min): Photos: Wound Location: Right Calcaneus - Distal Right Calcaneus - Medial Back - Midline Wounding Event: Pressure Injury Pressure Injury Pressure Injury Primary Etiology: Pressure Ulcer Pressure Ulcer Pressure Ulcer Comorbid History: Cataracts, Asthma, Cataracts, Asthma, Cataracts, Asthma, Hypertension, Type II Hypertension, Type II Hypertension, Type II Diabetes, Neuropathy, Diabetes, Neuropathy, Diabetes, Neuropathy, Received Chemotherapy Received Chemotherapy Received Chemotherapy Date Acquired: 09/29/2015 02/18/2016 05/02/2015 Weeks of Treatment: 27 6 49 Wound Status: Open Open Open Clustered Wound: Yes No No Measurements L x W x D 1.1x1.1x0.1 0.9x0.9x0.1 2.4x3.5x0.2 (cm) Area (cm) : 0.95 0.636 6.597 Volume (cm) : 0.095 0.064 1.319 % Reduction in Area: -34.40% -909.50% -2898.60% % Reduction in Volume:  -33.80% -966.70% -5895.50% Classification: Category/Stage II Category/Stage II Category/Stage III HBO Classification: Grade 1 Grade 1 N/A Exudate Amount: Small Small N/A Exudate Type: Serous Serous N/A Exudate Color: amber amber N/A Wound Margin: Flat and Intact Flat and Intact Flat and Intact Krog, Melady H. ([id number]) Granulation Amount: Large (67-100%) Large (67-100%) Large (67-100%) Granulation Quality: Hyper-granulation Red Red, Pink Necrotic Amount: Small (1-33%) None Present (0%) Small (1-33%) Exposed Structures: Fascia: No Fascia: No Fascia: No Fat: No Fat: No Fat: No Tendon: No Tendon: No Tendon: No Muscle: No Muscle: No Muscle: No Joint: No Joint: No Joint: No Bone: No Bone: No Bone: No Limited to Skin  Limited to Skin Limited to Skin Breakdown Breakdown Breakdown Epithelialization: None None None Periwound Skin Texture: Edema: Yes Edema: No Edema: No Excoriation: No Excoriation: No Excoriation: No Induration: No Induration: No Induration: No Callus: No Callus: No Callus: No Crepitus: No Crepitus: No Crepitus: No Fluctuance: No Fluctuance: No Fluctuance: No Friable: No Friable: No Friable: No Rash: No Rash: No Rash: No Scarring: No Scarring: No Scarring: No Periwound Skin Dry/Scaly: Yes Maceration: No Maceration: No Moisture: Maceration: No Moist: No Moist: No Moist: No Dry/Scaly: No Dry/Scaly: No Periwound Skin Color: Atrophie Blanche: No Atrophie Blanche: No Atrophie Blanche: No Cyanosis: No Cyanosis: No Cyanosis: No Ecchymosis: No Ecchymosis: No Ecchymosis: No Erythema: No Erythema: No Erythema: No Hemosiderin Staining: No Hemosiderin Staining: No Hemosiderin Staining: No Mottled: No Mottled: No Mottled: No Pallor: No Pallor: No Pallor: No Rubor: No Rubor: No Rubor: No Temperature: No Abnormality No Abnormality No Abnormality Tenderness on Yes Yes Yes Palpation: Wound Preparation: Ulcer Cleansing: Ulcer  Cleansing: Ulcer Cleansing: Rinsed/Irrigated with Rinsed/Irrigated with Rinsed/Irrigated with Saline Saline Saline Topical Anesthetic Topical Anesthetic Topical Anesthetic Applied: Other: lidocaine Applied: Other: lidocaine Applied: Other: lidocaine 4% 4% 4% Wound Number: 7 N/A N/A Photos: N/A N/A Sutch, Deion H. ([id number]) Wound Location: Right Ischial Tuberosity N/A N/A Wounding Event: Pressure Injury N/A N/A Primary Etiology: Pressure Ulcer N/A N/A Comorbid History: Cataracts, Asthma, N/A N/A Hypertension, Type II Diabetes, Neuropathy, Received Chemotherapy Date Acquired: 08/01/2015 N/A N/A Weeks of Treatment: 35 N/A N/A Wound Status: Open N/A N/A Clustered Wound: No N/A N/A Measurements L x W x D 0.1x0.5x0.3 N/A N/A (cm) Area (cm) : 0.039 N/A N/A Volume (cm) : 0.012 N/A N/A % Reduction in Area: 98.80% N/A N/A % Reduction in Volume: 96.20% N/A N/A Classification: Category/Stage III N/A N/A HBO Classification: N/A N/A N/A Exudate Amount: Medium N/A N/A Exudate Type: Serous N/A N/A Exudate Color: amber N/A N/A Wound Margin: Flat and Intact N/A N/A Granulation Amount: Large (67-100%) N/A N/A Granulation Quality: Pink N/A N/A Necrotic Amount: None Present (0%) N/A N/A Exposed Structures: Fascia: No N/A N/A Fat: No Tendon: No Muscle: No Joint: No Bone: No Limited to Skin Breakdown Epithelialization: None N/A N/A Periwound Skin Texture: Edema: No N/A N/A Excoriation: No Induration: No Callus: No Crepitus: No Fluctuance: No Friable: No Rash: No Scarring: No Periwound Skin Maceration: No N/A N/A Moisture: Moist: No Dry/Scaly: No Periwound Skin Color: Atrophie Blanche: No N/A N/A Cyanosis: No Robbins, Arvie H. ([id number]) Ecchymosis: No Erythema: No Hemosiderin Staining: No Mottled: No Pallor: No Rubor: No Temperature: No Abnormality N/A N/A Tenderness on Yes N/A N/A Palpation: Wound Preparation: Ulcer Cleansing: N/A N/A Rinsed/Irrigated  with Saline Topical Anesthetic Applied: Other: lidocaine 4% Treatment Notes Electronic Signature(s) Signed: 04/09/2016 4:48:23 PM By: Montey Hora Entered By: Montey Hora on 04/09/2016 15:41:23 Capizzi, Dessire H. ([id number]) -------------------------------------------------------------------------------- Multi-Disciplinary Care Plan Details Patient Name: [name], Cherine H. Date of Service: 04/09/2016 3:00 PM Medical Record Number: [id number] Patient Account Number: [id number] Date of Birth/Sex: March 02, 1946 (70 y.o. Female) Treating RN: Montey Hora Primary Care Physician: Paulita Cradle Other Clinician: Referring Physician: Paulita Cradle Treating Physician/Extender: Frann Rider in Treatment: 36 Active Inactive Abuse / Safety / Falls / Self Care Management Nursing Diagnoses: Impaired home maintenance Impaired physical mobility Knowledge deficit related to: safety; personal, health (wound), emergency Potential for falls Self care deficit: actual or potential Goals: Patient will remain injury free Date Initiated: 03/13/2015 Goal Status: Active Patient/caregiver will verbalize understanding of skin care regimen Date Initiated: 03/13/2015 Goal Status: Active Patient/caregiver  will verbalize/demonstrate measure taken to improve self care Date Initiated: 03/13/2015 Goal Status: Active Patient/caregiver will verbalize/demonstrate measures taken to improve the patient's personal safety Date Initiated: 03/13/2015 Goal Status: Active Patient/caregiver will verbalize/demonstrate measures taken to prevent injury and/or falls Date Initiated: 03/13/2015 Goal Status: Active Patient/caregiver will verbalize/demonstrate understanding of what to do in case of emergency Date Initiated: 03/13/2015 Goal Status: Active Interventions: Assess fall risk on admission and as needed Assess: immobility, friction, shearing, incontinence upon admission and as needed Assess impairment of  mobility on admission and as needed per policy Assess self care needs on admission and as needed Provide education on basic hygiene Sakurai, Jilda H. ([id number]) Provide education on fall prevention Provide education on personal and home safety Provide education on safe transfers Treatment Activities: Education provided on Basic Hygiene : 10/23/2015 Notes: Pressure Nursing Diagnoses: Knowledge deficit related to causes and risk factors for pressure ulcer development Knowledge deficit related to management of pressures ulcers Potential for impaired tissue integrity related to pressure, friction, moisture, and shear Goals: Patient will remain free from development of additional pressure ulcers Date Initiated: 03/13/2015 Goal Status: Active Patient will remain free of pressure ulcers Date Initiated: 03/13/2015 Goal Status: Active Patient/caregiver will verbalize risk factors for pressure ulcer development Date Initiated: 03/13/2015 Goal Status: Active Patient/caregiver will verbalize understanding of pressure ulcer management Date Initiated: 03/13/2015 Goal Status: Active Interventions: Assess: immobility, friction, shearing, incontinence upon admission and as needed Assess offloading mechanisms upon admission and as needed Assess potential for pressure ulcer upon admission and as needed Provide education on pressure ulcers Treatment Activities: Patient referred for home evaluation of offloading devices/mattresses : 01/02/2016 Patient referred for pressure reduction/relief devices : 01/02/2016 Patient referred for seating evaluation to ensure proper offloading : 01/02/2016 Pressure reduction/relief device ordered : 01/02/2016 Test ordered outside of clinic : 01/02/2016 Notes: Wound/Skin Impairment Lavis, Marcile H. ([id number]) Nursing Diagnoses: Impaired tissue integrity Knowledge deficit related to ulceration/compromised skin integrity Goals: Patient/caregiver will verbalize  understanding of skin care regimen Date Initiated: 03/13/2015 Goal Status: Active Ulcer/skin breakdown will have a volume reduction of 30% by week 4 Date Initiated: 03/13/2015 Goal Status: Active Ulcer/skin breakdown will have a volume reduction of 50% by week 8 Date Initiated: 03/13/2015 Goal Status: Active Ulcer/skin breakdown will have a volume reduction of 80% by week 12 Date Initiated: 03/13/2015 Goal Status: Active Ulcer/skin breakdown will heal within 14 weeks Date Initiated: 03/13/2015 Goal Status: Active Interventions: Assess patient/caregiver ability to obtain necessary supplies Assess patient/caregiver ability to perform ulcer/skin care regimen upon admission and as needed Assess ulceration(s) every visit Provide education on smoking Provide education on ulcer and skin care Treatment Activities: Patient referred to home care : 01/02/2016 Referred to DME Roselynne Lortz for dressing supplies : 01/02/2016 Skin care regimen initiated : 01/02/2016 Topical wound management initiated : 01/02/2016 Notes: Electronic Signature(s) Signed: 04/09/2016 4:48:23 PM By: Montey Hora Entered By: Montey Hora on 04/09/2016 15:41:12 Luzier, Tanijah H. ([id number]) -------------------------------------------------------------------------------- Pain Assessment Details Patient Name: [name], Jimi H. Date of Service: 04/09/2016 3:00 PM Medical Record Number: [id number] Patient Account Number: [id number] Date of Birth/Sex: 1946-03-04 (70 y.o. Female) Treating RN: Montey Hora Primary Care Physician: Paulita Cradle Other Clinician: Referring Physician: Paulita Cradle Treating Physician/Extender: Frann Rider in Treatment: 58 Active Problems Location of Pain Severity and Description of Pain Patient Has Paino Yes Site Locations Pain Location: Generalized Pain Pain Management and Medication Current Pain Management: Notes Topical or injectable lidocaine is offered to patient for acute  pain when surgical debridement is performed.  If needed, Patient is instructed to use over the counter pain medication for the following 24-48 hours after debridement. Wound care MDs do not prescribed pain medications. Patient has chronic pain or uncontrolled pain. Patient has been instructed to make an appointment with their Primary Care Physician for pain management. Electronic Signature(s) Signed: 04/09/2016 4:48:23 PM By: Montey Hora Entered By: Montey Hora on 04/09/2016 15:17:36 Grimley, Glen H. ([id number]) -------------------------------------------------------------------------------- Patient/Caregiver Education Details Patient Name: [name], Abia H. Date of Service: 04/09/2016 3:00 PM Medical Record Number: [id number] Patient Account Number: [id number] Date of Birth/Gender: 22-Jan-1946 (70 y.o. Female) Treating RN: Montey Hora Primary Care Physician: Paulita Cradle Other Clinician: Referring Physician: Paulita Cradle Treating Physician/Extender: Frann Rider in Treatment: 79 Education Assessment Education Provided To: Patient and Caregiver Education Topics Provided Wound/Skin Impairment: Handouts: Other: wound care as ordered Methods: Demonstration, Explain/Verbal, Printed Responses: State content correctly Electronic Signature(s) Signed: 04/09/2016 4:48:23 PM By: Montey Hora Entered By: Montey Hora on 04/09/2016 16:14:36 Speciale, Maeson H. ([id number]) -------------------------------------------------------------------------------- Wound Assessment Details Patient Name: Szczepanik, Raeanne H. Date of Service: 04/09/2016 3:00 PM Medical Record Number: [id number] Patient Account Number: [id number] Date of Birth/Sex: [birth year] (70 y.o. Female) Treating RN: Montey Hora Primary Care Physician: Paulita Cradle Other Clinician: Referring Physician: Paulita Cradle Treating Physician/Extender: Frann Rider in Treatment: 46 Wound Status Wound  Number: 11 Primary Pressure Ulcer Etiology: Wound Location: Right Calcaneus - Distal Wound Open Wounding Event: Pressure Injury Status: Date Acquired: 09/29/2015 Comorbid Cataracts, Asthma, Hypertension, Type Weeks Of Treatment: 27 History: II Diabetes, Neuropathy, Received Clustered Wound: Yes Chemotherapy Photos Wound Measurements Length: (cm) 1.1 Width: (cm) 1.1 Depth: (cm) 0.1 Area: (cm) 0.95 Volume: (cm) 0.095 % Reduction in Area: -34.4% % Reduction in Volume: -33.8% Epithelialization: None Tunneling: No Undermining: No Wound Description Classification: Category/Stage II Foul Odor Af Diabetic Severity (Wagner): Grade 1 Wound Margin: Flat and Intact Exudate Amount: Small Exudate Type: Serous Exudate Color: amber ter Cleansing: No Wound Bed Granulation Amount: Large (67-100%) Exposed Structure Granulation Quality: Hyper-granulation Fascia Exposed: No Necrotic Amount: Small (1-33%) Fat Layer Exposed: No Necrotic Quality: Adherent Slough Tendon Exposed: No Wojtaszek, Landry H. ([id number]) Muscle Exposed: No Joint Exposed: No Bone Exposed: No Limited to Skin Breakdown Periwound Skin Texture Texture Color No Abnormalities Noted: No No Abnormalities Noted: No Callus: No Atrophie Blanche: No Crepitus: No Cyanosis: No Excoriation: No Ecchymosis: No Fluctuance: No Erythema: No Friable: No Hemosiderin Staining: No Induration: No Mottled: No Localized Edema: Yes Pallor: No Rash: No Rubor: No Scarring: No Temperature / Pain Moisture Temperature: No Abnormality No Abnormalities Noted: No Tenderness on Palpation: Yes Dry / Scaly: Yes Maceration: No Moist: No Wound Preparation Ulcer Cleansing: Rinsed/Irrigated with Saline Topical Anesthetic Applied: Other: lidocaine 4%, Treatment Notes Wound #11 (Right, Distal Calcaneus) 1. Cleansed with: Clean wound with Normal Saline 2. Anesthetic Topical Lidocaine 4% cream to wound bed prior to debridement 4.  Dressing Applied: Prisma Ag 5. Secondary Dressing Applied Bordered Foam Dressing Electronic Signature(s) Signed: 04/09/2016 4:48:23 PM By: Montey Hora Entered By: Montey Hora on 04/09/2016 15:38:39 Heidelberg, Miosotis H. ([id number]) -------------------------------------------------------------------------------- Wound Assessment Details Patient Name: Agustin, Anyra H. Date of Service: 04/09/2016 3:00 PM Medical Record Number: [id number] Patient Account Number: [id number] Date of Birth/Sex: 04-20-1946 (70 y.o. Female) Treating RN: Montey Hora Primary Care Physician: Paulita Cradle Other Clinician: Referring Physician: Paulita Cradle Treating Physician/Extender: Frann Rider in Treatment: 56 Wound Status Wound Number: 15 Primary Pressure Ulcer Etiology: Wound Location: Right Calcaneus - Medial Wound Open Wounding Event: Pressure Injury Status: Date Acquired:  02/18/2016 Comorbid Cataracts, Asthma, Hypertension, Type Weeks Of Treatment: 6 History: II Diabetes, Neuropathy, Received Clustered Wound: No Chemotherapy Photos Wound Measurements Length: (cm) 0.9 Width: (cm) 0.9 Depth: (cm) 0.1 Area: (cm) 0.636 Volume: (cm) 0.064 % Reduction in Area: -909.5% % Reduction in Volume: -966.7% Epithelialization: None Tunneling: No Undermining: No Wound Description Classification: Category/Stage II Foul Odor A Diabetic Severity (Wagner): Grade 1 Wound Margin: Flat and Intact Exudate Amount: Small Exudate Type: Serous Exudate Color: amber fter Cleansing: No Wound Bed Granulation Amount: Large (67-100%) Exposed Structure Granulation Quality: Red Fascia Exposed: No Necrotic Amount: None Present (0%) Fat Layer Exposed: No Tendon Exposed: No Minks, Azaya H. ([id number]) Muscle Exposed: No Joint Exposed: No Bone Exposed: No Limited to Skin Breakdown Periwound Skin Texture Texture Color No Abnormalities Noted: No No Abnormalities Noted: No Callus: No Atrophie  Blanche: No Crepitus: No Cyanosis: No Excoriation: No Ecchymosis: No Fluctuance: No Erythema: No Friable: No Hemosiderin Staining: No Induration: No Mottled: No Localized Edema: No Pallor: No Rash: No Rubor: No Scarring: No Temperature / Pain Moisture Temperature: No Abnormality No Abnormalities Noted: No Tenderness on Palpation: Yes Dry / Scaly: No Maceration: No Moist: No Wound Preparation Ulcer Cleansing: Rinsed/Irrigated with Saline Topical Anesthetic Applied: Other: lidocaine 4%, Treatment Notes Wound #15 (Right, Medial Calcaneus) 1. Cleansed with: Clean wound with Normal Saline 2. Anesthetic Topical Lidocaine 4% cream to wound bed prior to debridement 4. Dressing Applied: Prisma Ag 5. Secondary Dressing Applied Bordered Foam Dressing Electronic Signature(s) Signed: 04/09/2016 4:48:23 PM By: Montey Hora Entered By: Montey Hora on 04/09/2016 15:39:05 Trueheart, Zenia H. ([id number]) -------------------------------------------------------------------------------- Wound Assessment Details Patient Name: Beidler, Jeneal H. Date of Service: 04/09/2016 3:00 PM Medical Record Number: [id number] Patient Account Number: [id number] Date of Birth/Sex: [birth year] (70 y.o. Female) Treating RN: Montey Hora Primary Care Physician: Paulita Cradle Other Clinician: Referring Physician: Paulita Cradle Treating Physician/Extender: Frann Rider in Treatment: 56 Wound Status Wound Number: 3 Primary Pressure Ulcer Etiology: Wound Location: Back - Midline Wound Open Wounding Event: Pressure Injury Status: Date Acquired: 05/02/2015 Comorbid Cataracts, Asthma, Hypertension, Type Weeks Of Treatment: 49 History: II Diabetes, Neuropathy, Received Clustered Wound: No Chemotherapy Photos Wound Measurements Length: (cm) 2.4 Width: (cm) 3.5 Depth: (cm) 0.2 Area: (cm) 6.597 Volume: (cm) 1.319 % Reduction in Area: -2898.6% % Reduction in Volume:  -5895.5% Epithelialization: None Tunneling: No Undermining: No Wound Description Classification: Category/Stage III Wound Margin: Flat and Intact Wound Bed Granulation Amount: Large (67-100%) Exposed Structure Granulation Quality: Red, Pink Fascia Exposed: No Necrotic Amount: Small (1-33%) Fat Layer Exposed: No Necrotic Quality: Adherent Slough Tendon Exposed: No Muscle Exposed: No Joint Exposed: No Bone Exposed: No Limited to Skin Breakdown Dovel, Ilithyia H. ([id number]) Periwound Skin Texture Texture Color No Abnormalities Noted: No No Abnormalities Noted: No Callus: No Atrophie Blanche: No Crepitus: No Cyanosis: No Excoriation: No Ecchymosis: No Fluctuance: No Erythema: No Friable: No Hemosiderin Staining: No Induration: No Mottled: No Localized Edema: No Pallor: No Rash: No Rubor: No Scarring: No Temperature / Pain Moisture Temperature: No Abnormality No Abnormalities Noted: No Tenderness on Palpation: Yes Dry / Scaly: No Maceration: No Moist: No Wound Preparation Ulcer Cleansing: Rinsed/Irrigated with Saline Topical Anesthetic Applied: Other: lidocaine 4%, Treatment Notes Wound #3 (Midline Back) 1. Cleansed with: Clean wound with Normal Saline 2. Anesthetic Topical Lidocaine 4% cream to wound bed prior to debridement 4. Dressing Applied: Santyl Ointment 5. Secondary Dressing Applied Bordered Foam Dressing Electronic Signature(s) Signed: 04/09/2016 4:48:23 PM By: Montey Hora Entered By: Montey Hora on 04/09/2016 15:39:34 Moes,  Akili H. ([id number]) -------------------------------------------------------------------------------- Wound Assessment Details Patient Name: Thorman, Shondrika H. Date of Service: 04/09/2016 3:00 PM Medical Record Number: [id number] Patient Account Number: [id number] Date of Birth/Sex: Oct 03, 1945 (70 y.o. Female) Treating RN: Montey Hora Primary Care Physician: Paulita Cradle Other Clinician: Referring Physician:  Paulita Cradle Treating Physician/Extender: Frann Rider in Treatment: 50 Wound Status Wound Number: 7 Primary Pressure Ulcer Etiology: Wound Location: Right Ischial Tuberosity Wound Open Wounding Event: Pressure Injury Status: Date Acquired: 08/01/2015 Comorbid Cataracts, Asthma, Hypertension, Type Weeks Of Treatment: 35 History: II Diabetes, Neuropathy, Received Clustered Wound: No Chemotherapy Photos Wound Measurements Length: (cm) 0.1 Width: (cm) 0.5 Depth: (cm) 0.3 Area: (cm) 0.039 Volume: (cm) 0.012 % Reduction in Area: 98.8% % Reduction in Volume: 96.2% Epithelialization: None Tunneling: No Undermining: No Wound Description Classification: Category/Stage III Wound Margin: Flat and Intact Exudate Amount: Medium Exudate Type: Serous Exudate Color: amber Foul Odor After Cleansing: No Wound Bed Granulation Amount: Large (67-100%) Exposed Structure Granulation Quality: Pink Fascia Exposed: No Necrotic Amount: None Present (0%) Fat Layer Exposed: No Tendon Exposed: No Muscle Exposed: No Munn, Jennifermarie H. ([id number]) Joint Exposed: No Bone Exposed: No Limited to Skin Breakdown Periwound Skin Texture Texture Color No Abnormalities Noted: No No Abnormalities Noted: No Callus: No Atrophie Blanche: No Crepitus: No Cyanosis: No Excoriation: No Ecchymosis: No Fluctuance: No Erythema: No Friable: No Hemosiderin Staining: No Induration: No Mottled: No Localized Edema: No Pallor: No Rash: No Rubor: No Scarring: No Temperature / Pain Moisture Temperature: No Abnormality No Abnormalities Noted: No Tenderness on Palpation: Yes Dry / Scaly: No Maceration: No Moist: No Wound Preparation Ulcer Cleansing: Rinsed/Irrigated with Saline Topical Anesthetic Applied: Other: lidocaine 4%, Treatment Notes Wound #7 (Right Ischial Tuberosity) 1. Cleansed with: Clean wound with Normal Saline 2. Anesthetic Topical Lidocaine 4% cream to wound bed  prior to debridement 4. Dressing Applied: Prisma Ag 5. Secondary Dressing Applied Bordered Foam Dressing Electronic Signature(s) Signed: 04/09/2016 4:48:23 PM By: Montey Hora Entered By: Montey Hora on 04/09/2016 15:40:19 Streett, Britanni H. ([id number]) -------------------------------------------------------------------------------- Vitals Details Patient Name: [name], Tameah H. Date of Service: 04/09/2016 3:00 PM Medical Record Number: [id number] Patient Account Number: [id number] Date of Birth/Sex: 1946/02/12 (70 y.o. Female) Treating RN: Montey Hora Primary Care Physician: Paulita Cradle Other Clinician: Referring Physician: Paulita Cradle Treating Physician/Extender: Frann Rider in Treatment: 31 Vital Signs Time Taken: 15:18 Temperature (F): 98.2 Height (in): 65 Pulse (bpm): 108 Weight (lbs): 100 Respiratory Rate (breaths/min): 18 Body Mass Index (BMI): 16.6 Blood Pressure (mmHg): 89/60 Reference Range: 80 - 120 mg / dl Electronic Signature(s) Signed: 04/09/2016 4:48:23 PM By: Montey Hora Entered By: Montey Hora on 04/09/2016 15:19:43

## 2016-04-10 NOTE — Progress Notes (Addendum)
NEWBERG, Akeela H. ([id number]) Visit Report for 04/09/2016 Chief Complaint Document Details Patient Name: [name], [name] Date of Service: 04/09/2016 3:00 PM Medical Record Number: [id number] Patient Account Number: [id number] Date of Birth/Sex: December 08, 1945 (70 y.o. Female) Treating RN: Montey Hora Primary Care Physician: Paulita Cradle Other Clinician: Referring Physician: Paulita Cradle Treating Physician/Extender: Frann Rider in Treatment: 32 Information Obtained from: Patient Chief Complaint Patient presents to the wound care center for a consult due non healing wound. Ulcers on the right elbow and the right heel for about 1 month. Electronic Signature(s) Signed: 04/09/2016 3:07:35 PM By: Christin Fudge MD, FACS Entered By: Christin Fudge on 04/09/2016 15:07:35 Kohlman, Jeanae H. ([id number]) -------------------------------------------------------------------------------- Debridement Details Patient Name: [name], Dally H. Date of Service: 04/09/2016 3:00 PM Medical Record Number: [id number] Patient Account Number: [id number] Date of Birth/Sex: 1946/08/24 (70 y.o. Female) Treating RN: Montey Hora Primary Care Physician: Paulita Cradle Other Clinician: Referring Physician: Paulita Cradle Treating Physician/Extender: Frann Rider in Treatment: 56 Debridement Performed for Wound #3 Midline Back Assessment: Performed By: Physician Christin Fudge, MD Debridement: Debridement Pre-procedure Yes Verification/Time Out Taken: Start Time: 15:41 Pain Control: Lidocaine 4% Topical Solution Level: Skin/Subcutaneous Tissue Total Area Debrided (L x 2.4 (cm) x 3.5 (cm) = 8.4 (cm) W): Tissue and other Viable, Non-Viable, Eschar, Fibrin/Slough, Subcutaneous material debrided: Instrument: Blade, Forceps Bleeding: Minimum Hemostasis Achieved: Pressure End Time: 15:44 Procedural Pain: 0 Post Procedural Pain: 0 Response to Treatment: Procedure was tolerated  well Post Debridement Measurements of Total Wound Length: (cm) 2.4 Stage: Category/Stage IV Width: (cm) 3.5 Depth: (cm) 0.2 Volume: (cm) 1.319 Post Procedure Diagnosis Same as Pre-procedure Notes in the midline towards the superior edge that is palpable bone this is possibly the spinous processes which are now exposed. Electronic Signature(s) Signed: 04/09/2016 4:04:18 PM By: Christin Fudge MD, FACS Signed: 04/09/2016 4:48:23 PM By: Montey Hora Entered By: Christin Fudge on 04/09/2016 16:04:18 Despain, Lyndsy H. ([id number]) Furth, Taniaya H. ([id number]) -------------------------------------------------------------------------------- HPI Details Patient Name: [name], [name] Date of Service: 04/09/2016 3:00 PM Medical Record Number: [id number] Patient Account Number: [id number] Date of Birth/Sex: June 07, 1946 (70 y.o. Female) Treating RN: Montey Hora Primary Care Physician: Paulita Cradle Other Clinician: Referring Physician: Paulita Cradle Treating Physician/Extender: Frann Rider in Treatment: 39 History of Present Illness Location: Ulceration on the right heel and the right elbow. Quality: Patient reports experiencing a dull pain to affected area(s). Severity: Patient states wound (s) are getting better. Duration: Patient has had the wound for < 4 weeks prior to presenting for treatment Timing: Pain in wound is Intermittent (comes and goes Context: The wound appeared gradually over time Modifying Factors: Consults to this date include:Augmentin and Bactrim and also some heel protection with duoderm Associated Signs and Symptoms: Patient reports having difficulty standing for long periods. HPI Description: 71 year old female with history of peripheral neuropathy, history of diet controlled diabetes mellitus type 2, history of alcoholism here for wound consult sent by her PCP Dr. Sherilyn Cooter. She has pressure ulcers at her right elbow, bilateral heels. Plain films of  right calcaneus without acute bony process. Patient started by PCP on Augmentin, Bactrim as per orders, DuoDerm dressings applied - reports some improvement in her ulcer since last seen. Denies fever, chills, nausea, vomiting, diarrhea. She had a right humerus fracture in the middle of May and has had no surgery and arm is in a sling. She is also been laying in the bed for quite a while. Past medical history significant for essential hypertension, osteoporosis, peripheral neuropathy, alcoholism,  ataxia, personal history of breast cancer treated with surgery chemotherapy and radiation and this was done in December 2010. she is also status post laparoscopic cholecystectomy, pilonidal cyst excision, subcutaneous port placement, partial mastectomy on the left side, skin cancer removal. 03/21/2015 -- she says overall she's been doing better and continues to smoke about 15 cigarettes a day. 03/21/2015 - her orthopedic doctor has said she may require surgery for her right humerus fracture. 04/04/2015 -- her orthopedic surgery has been scheduled for August 11. 04/18/2015 -- she is doing fine as far as her elbow and her right heel goes but she has developed some redness over prominence on her thoracic spine and wanted me to take a look at this. 05/02/2015 -- she had her surgery done and now is in a sling and support. Her back has developed a pressure injury of undetermined stage. She seems to be in better spirits. 05/16/2015 -- last week her right heel was looking great and we had healed it out, but she has not been offloading appropriately and has a deep tissue injury on the right heel again. The area on her right elbow has opened out with slough and the area in the thoracic spine is also getting worse. 05/30/2015 -- she has developed 2 new ulcerations one on her left ischial tuberosity and one on the sacral region. She is still working on getting up smoking but is also unable to take her vitamins as she  says she develops a diarrhea when she takes vitamins. She has increased her intake of proteins. 06/06/2015 -- the patient's husband manages to get her a low air loss mattress with initiating pressure but [name], [name] ([id number]) did not know how to use it exactly and the patient was not happy about using it. Overall she says she's been feeling better. 07/11/2015 -- . Discussed a surgical opinion for debridement and application of a wound VAC, 2 weeks ago but the patient has been reluctant to get a surgical opinion as she wants to avoid surgery. 07/18/2015 -- they have an appointment to see Dr. Tamala Julian this coming Wednesday. 07/29/2015 -- they saw Dr. Tamala Julian in his office and he did a debridement of the wound and removed significant amount of slough. This is in addition to the debridement I had done previously on Friday where a large amount of the eschar was removed. He did not recommend the application of wound VAC. Addendum : Dr. Thompson Caul note was reviewed via EPIC and details noted as above. 08/05/2015 -- over the last week she has developed a another pressure injury to her right hip and has had significant discoloration of the skin and an eschar there. 08/15/2015 -- she is still smoking about a pack of cigarettes a day and does not seem to want to quit. They have not been able to talk to the vendor regarding the air mattress and I will ask them to get in touch again. She is reluctant to take vitamins and does admit her nutrition is poor. 08/22/2015 -- she has been unable to tolerate her vitamins and continues to smoke. They're working on getting a low air loss mattress and have been speaking with the vendor. 09/19/2015 -- since her last visit and was admitted to the hospital between 09/09/2015 and 09/16/2015. She was thought to have an active sepsis possibly from one of her decubitus ulcers but nothing was grown except for an MSSA from her thoracic spine region. CT of this area did not show  any osteomyelitis. Been  given IV antibiotics which included vancomycin and Zosyn in the hospital under the care of Dr. [name] the ID specialist she was sent home on oral Keflex 500 mg 3 times a day for 2 weeks. he will consider an MRI in the outpatient setting to completely rule out osteomyelitis of the spine. 10/03/2015 --readmitted to hospital on 09/23/2015 for general debility, lethargy and possible sepsis and workup was in progress. Seen by Dr. [name] and he would also like a workup for underlying malignancy as sheos had previous ultrasounds of the breasts suggesting findings of concern. Workup done so far does not suggest deep bony infection. She was treated for a pneumonia and received Zosyn and vancomycin. She had been recommended Cipro 500 twice a day and doxycycline 100 mg twice a day once she was to be [place] home. The antibiotics were to be stopped on 10/07/2015. 10/17/2015 -- patient is feeling much better and has been eating better and doing her offloading as much as possible. 10/23/2015 -- she has an appointment to see Dr. [name] tomorrow but other than that has been doing as much as possible with offloading and increasing her diet. 11/07/2015 -- she has not been here to see [place] for 2 weeks and at the present time continues to try and eat better, work on off loading and is using the air mattress at night. She saw her PCP yesterday and she has put her back on a fentanyl patch. 11/21/2015 -- for some strange reason she has been sitting in a chair with her legs dependent for the last 6 days nonstop and has developed massive lower extremity edema and pedal edema with weeping and ulceration. 02/13/2016 -- it's been 3 weeks since I saw her last and overall she's done well except for a right heel where she has had some new ulcerations. Electronic Signature(s) Signed: 04/09/2016 3:07:40 PM By: Christin Fudge MD, FACS Entered By: Christin Fudge on 04/09/2016 15:07:40 Aamodt, Tationna  H. ([id number]) Freas, Tanisia H. ([id number]) -------------------------------------------------------------------------------- Physical Exam Details Patient Name: Holtzer, [name] Date of Service: 04/09/2016 3:00 PM Medical Record Number: [id number] Patient Account Number: [id number] Date of Birth/Sex: [birth year] (70 y.o. Female) Treating RN: Montey Hora Primary Care Physician: Paulita Cradle Other Clinician: Referring Physician: Paulita Cradle Treating Physician/Extender: Frann Rider in Treatment: 56 Constitutional . Pulse regular. Respirations normal and unlabored. Afebrile. . Eyes Nonicteric. Reactive to light. Ears, Nose, Mouth, and Throat Lips, teeth, and gums WNL.Marland Kitchen Moist mucosa without lesions. Neck supple and nontender. No palpable supraclavicular or cervical adenopathy. Normal sized without goiter. Respiratory WNL. No retractions.. Cardiovascular Pedal Pulses WNL. No clubbing, cyanosis or edema. Lymphatic No adneopathy. No adenopathy. No adenopathy. Musculoskeletal Adexa without tenderness or enlargement.. Digits and nails w/o clubbing, cyanosis, infection, petechiae, ischemia, or inflammatory conditions.. Integumentary (Hair, Skin) No suspicious lesions. No crepitus or fluctuance. No peri-wound warmth or erythema. No masses.Marland Kitchen Psychiatric Judgement and insight Intact.. No evidence of depression, anxiety, or agitation.. Notes wounds on both heels, right hip are looking excellent and no debridement was required. We will use Prisma AG on these wounds. Wound on the thoracic spine has some necrotic debris on the right lateral edge and this was sharply debrided with a forcep and 15 blade. Son however is the fact that I can palpate bone in the midline to as the superior edge and this is possibly the spinous process. Electronic Signature(s) Signed: 04/09/2016 4:02:44 PM By: Christin Fudge MD, FACS Entered By: Christin Fudge on 04/09/2016 16:02:44 Snowden, Taisia H.  ([id number]) --------------------------------------------------------------------------------  Physician Orders Details Patient Name: NEALEY, Kalli H. Date of Service: 04/09/2016 3:00 PM Medical Record Number: [id number] Patient Account Number: [id number] Date of Birth/Sex: March 17, 1946 (70 y.o. 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 Wound #15 Right,Medial Calcaneus o Clean wound with Normal Saline. Wound #3 Midline Back o Clean wound with Normal Saline. Wound #7 Right Ischial Tuberosity o Clean wound with Normal Saline. Anesthetic Wound #11 Right,Distal Calcaneus o Topical Lidocaine 4% cream applied to wound bed prior to debridement - applied in clinic only Wound #15 Right,Medial Calcaneus o Topical Lidocaine 4% cream applied to wound bed prior to debridement - applied in clinic only Wound #3 Midline Back o Topical Lidocaine 4% cream applied to wound bed prior to debridement - applied in clinic only Wound #7 Right Ischial Tuberosity o Topical Lidocaine 4% cream applied to wound bed prior to debridement - applied in clinic only Primary Wound Dressing Wound #11 Right,Distal Calcaneus Achord, Aalijah H. ([id number]) o Prisma Ag Wound #15 Right,Medial Calcaneus o Prisma Ag Wound #7 Right Ischial Tuberosity o Prisma  Ag Wound #3 Midline Back o Santyl Ointment Secondary Dressing Wound #11 Right,Distal Calcaneus o Boardered Foam Dressing Wound #15 Right,Medial Calcaneus o Boardered Foam Dressing Wound #3 Midline Back o Boardered Foam Dressing Wound #7 Right Ischial Tuberosity o Boardered Foam Dressing Dressing Change Frequency Wound #11 Right,Distal Calcaneus o Change dressing every other day. Wound #15 Right,Medial Calcaneus o Change dressing every other day. Wound #3 Midline Back o Change dressing every day. Wound #7 Right Ischial Tuberosity o Change dressing every day. Follow-up Appointments Wound #11 Right,Distal Calcaneus o Return Appointment in 1 week. Wound #15 Right,Medial Calcaneus o Return Appointment in 1 week. Wound #3 Midline Back o Return Appointment in 1 week. Wound #7 Right Ischial Tuberosity Proia, Riniyah H. ([id number]) o Return Appointment in 1 week. Off-Loading Wound #11 Right,Distal Calcaneus o Turn and reposition every 2 hours - Keep pressure off of back and heels Wound #15 Right,Medial Calcaneus o Turn and reposition every 2 hours - Keep pressure off of back and heels Wound #3 Midline Back o Turn and reposition every 2 hours - Keep pressure off of back and heels Wound #7 Right Ischial Tuberosity o Turn and reposition every 2 hours - Keep pressure off of back and heels Additional Orders / Instructions Wound #11 Right,Distal Calcaneus o Stop Smoking o Increase protein intake. Wound #15 Right,Medial Calcaneus o Stop Smoking o Increase protein intake. Wound #3 Midline Back o Stop Smoking o Increase protein intake. Wound #7 Right Ischial Tuberosity o Stop Smoking o Increase protein intake. Home Health Wound #11 [place] Visits - [place] Nurse may visit PRN to address patientos wound care needs. o FACE TO FACE ENCOUNTER: MEDICARE and MEDICAID PATIENTS: I  certify that this patient is under my care and that I had a face-to-face encounter that meets the physician face-to-face encounter requirements with this patient on this date. The encounter with the patient was  in whole or in part for the following MEDICAL CONDITION: (primary reason for Home Healthcare) MEDICAL NECESSITY: I certify, that based on my findings, NURSING services are a medically necessary home health service. HOME BOUND STATUS: I certify that my clinical findings support that this patient is homebound (i.e., Due to illness or injury, pt requires aid of supportive devices such as crutches, cane, wheelchairs, walkers, the use of special transportation or the assistance of another person to leave their place of residence. There is a normal inability to leave the home and doing so requires considerable and taxing effort. Other absences are for medical reasons / religious services and are infrequent or of short duration when for other reasons). Avitia, Jenia H. ([id number]) o If current dressing causes regression in wound condition, may D/C ordered dressing product/s and apply Normal Saline Moist Dressing daily until next [place] / Other MD appointment. [place] of regression in wound condition at [phone]. o Please direct any NON-WOUND related issues/requests for orders to patient's Primary Care Physician Wound #15 [place] Visits - [place] Nurse may visit PRN to address patientos wound care needs. o FACE TO FACE ENCOUNTER: MEDICARE and MEDICAID PATIENTS: I certify that this patient is under my care and that I had a face-to-face encounter that meets the physician face-to-face encounter requirements with this patient on this date. The encounter with the patient was in whole or in part for the following MEDICAL CONDITION: (primary reason for [place]) MEDICAL NECESSITY: I certify,  that based on my findings, NURSING services are a medically necessary home health service. HOME BOUND STATUS: I certify that my clinical findings support that this patient is homebound (i.e., Due to illness or injury, pt requires aid of supportive devices such as crutches, cane, wheelchairs, walkers, the use of special transportation or the assistance of another person to leave their place of residence. There is a normal inability to leave the home and doing so requires considerable and taxing effort. Other absences are for medical reasons / religious services and are infrequent or of short duration when for other reasons). o If current dressing causes regression in wound condition, may D/C ordered dressing product/s and apply Normal Saline Moist Dressing daily until next [place] / Other MD appointment. [place] of regression in wound condition at [phone]. o Please direct any NON-WOUND related issues/requests for orders to patient's Primary Care Physician Wound #3 Midline Back o [place] Visits - [place] Nurse may visit PRN to address patientos wound care needs. o FACE TO FACE ENCOUNTER: MEDICARE and MEDICAID PATIENTS: I certify that this patient is under my care and that I had a face-to-face encounter that meets the physician face-to-face encounter requirements with this patient on this date. The encounter with the patient was in whole or in part for the following MEDICAL CONDITION: (primary reason for [place]) MEDICAL NECESSITY: I certify, that based on my findings, NURSING services are a medically necessary home health service. HOME BOUND STATUS: I certify that my clinical findings support that this patient is homebound (i.e., Due to illness or injury, pt requires aid of supportive devices such as crutches, cane, wheelchairs, walkers, the use of special transportation or the assistance of another person to  leave their place of residence. There is a normal inability to leave the home and doing so requires considerable and taxing effort. Other absences are for medical reasons / religious  services and are infrequent or of short duration when for other reasons). o If current dressing causes regression in wound condition, may D/C ordered dressing product/s and apply Normal Saline Moist Dressing daily until next [place] / Other MD appointment. [place] of regression in wound condition at [phone]. o Please direct any NON-WOUND related issues/requests for orders to patient's Primary Care Physician Wound #7 Right Ischial [place] Visits - Jenissa Gayda, Sedalia H. ([id number]) o [place] Nurse may visit PRN to address patientos wound care needs. o FACE TO FACE ENCOUNTER: MEDICARE and MEDICAID PATIENTS: I certify that this patient is under my care and that I had a face-to-face encounter that meets the physician face-to-face encounter requirements with this patient on this date. The encounter with the patient was in whole or in part for the following MEDICAL CONDITION: (primary reason for [place]) MEDICAL NECESSITY: I certify, that based on my findings, NURSING services are a medically necessary home health service. HOME BOUND STATUS: I certify that my clinical findings support that this patient is homebound (i.e., Due to illness or injury, pt requires aid of supportive devices such as crutches, cane, wheelchairs, walkers, the use of special transportation or the assistance of another person to leave their place of residence. There is a normal inability to leave the home and doing so requires considerable and taxing effort. Other absences are for medical reasons / religious services and are infrequent or of short duration when for other reasons). o If current dressing causes regression in wound condition, may D/C ordered  dressing product/s and apply Normal Saline Moist Dressing daily until next [place] / Other MD appointment. [place] of regression in wound condition at [phone]. o Please direct any NON-WOUND related issues/requests for orders to patient's Primary Care Physician Electronic Signature(s) Signed: 04/09/2016 4:08:08 PM By: Christin Fudge MD, FACS Signed: 04/09/2016 4:48:23 PM By: Montey Hora Entered By: Montey Hora on 04/09/2016 15:55:01 Seaman, Kao H. ([id number]) -------------------------------------------------------------------------------- Problem List Details Patient Name: Aquilino, [name] Date of Service: 04/09/2016 3:00 PM Medical Record Number: [id number] Patient Account Number: [id number] Date of Birth/Sex: [birth year] (70 y.o. Female) Treating RN: Montey Hora Primary Care Physician: Paulita Cradle Other Clinician: Referring Physician: Paulita Cradle Treating Physician/Extender: Frann Rider in Treatment: 37 Active Problems ICD-10 Encounter Code Description Active Date Diagnosis E11.621 Type 2 diabetes mellitus with foot ulcer 03/13/2015 Yes F17.218 Nicotine dependence, cigarettes, with other nicotine- 03/13/2015 Yes induced disorders L89.100 Pressure ulcer of unspecified part of back, unstageable 05/02/2015 Yes L89.213 Pressure ulcer of right hip, stage 3 08/05/2015 Yes L89.613 Pressure ulcer of right heel, stage 3 02/27/2016 Yes Inactive Problems Resolved Problems ICD-10 Code Description Active Date Resolved Date L89.613 Pressure ulcer of right heel, stage 3 03/13/2015 03/13/2015 L89.013 Pressure ulcer of right elbow, stage 3 03/13/2015 03/13/2015 [id number] Pressure ulcer of left hip, stage 2 05/30/2015 05/30/2015 L89.153 Pressure ulcer of sacral region, stage 3 05/30/2015 05/30/2015 Kersting, Havah H. [id number]) I89.0 Lymphedema, not elsewhere classified 11/21/2015 11/21/2015 L97.211 Non-pressure chronic ulcer of right calf  limited to 11/21/2015 11/21/2015 breakdown of skin Electronic Signature(s) Signed: 04/09/2016 3:07:28 PM By: Christin Fudge MD, FACS Entered By: Christin Fudge on 04/09/2016 15:07:28 Rochelle, Rosalena H. ([id number]) -------------------------------------------------------------------------------- Progress Note Details Patient Name: Lavis, [name] Date of Service: 04/09/2016 3:00 PM Medical Record Number: [id number] Patient Account Number: [id number] Date of Birth/Sex: 1945-12-16 (70 y.o. Female) Treating RN: Montey Hora Primary Care Physician: Paulita Cradle Other Clinician: Referring  Physician: Paulita Cradle Treating Physician/Extender: Frann Rider in Treatment: 7 Subjective Chief Complaint Information obtained from Patient Patient presents to the wound care center for a consult due non healing wound. Ulcers on the right elbow and the right heel for about 1 month. History of Present Illness (HPI) The following HPI elements were documented for the patient's wound: Location: Ulceration on the right heel and the right elbow. Quality: Patient reports experiencing a dull pain to affected area(s). Severity: Patient states wound (s) are getting better. Duration: Patient has had the wound for < 4 weeks prior to presenting for treatment Timing: Pain in wound is Intermittent (comes and goes Context: The wound appeared gradually over time Modifying Factors: Consults to this date include:Augmentin and Bactrim and also some heel protection with duoderm Associated Signs and Symptoms: Patient reports having difficulty standing for long periods. 70 year old female with history of peripheral neuropathy, history of diet controlled diabetes mellitus type 2, history of alcoholism here for wound consult sent by her PCP Dr. Sherilyn Cooter. She has pressure ulcers at her right elbow, bilateral heels. Plain films of right calcaneus without acute bony process. Patient started by PCP on Augmentin, Bactrim  as per orders, DuoDerm dressings applied - reports some improvement in her ulcer since last seen. Denies fever, chills, nausea, vomiting, diarrhea. She had a right humerus fracture in the middle of May and has had no surgery and arm is in a sling. She is also been laying in the bed for quite a while. Past medical history significant for essential hypertension, osteoporosis, peripheral neuropathy, alcoholism, ataxia, personal history of breast cancer treated with surgery chemotherapy and radiation and this was done in December 2010. she is also status post laparoscopic cholecystectomy, pilonidal cyst excision, subcutaneous port placement, partial mastectomy on the left side, skin cancer removal. 03/21/2015 -- she says overall she's been doing better and continues to smoke about 15 cigarettes a day. 03/21/2015 - her orthopedic doctor has said she may require surgery for her right humerus fracture. 04/04/2015 -- her orthopedic surgery has been scheduled for August 11. 04/18/2015 -- she is doing fine as far as her elbow and her right heel goes but she has developed some redness over prominence on her thoracic spine and wanted me to take a look at this. Rettinger, Orvella H. ([id number]) 05/02/2015 -- she had her surgery done and now is in a sling and support. Her back has developed a pressure injury of undetermined stage. She seems to be in better spirits. 05/16/2015 -- last week her right heel was looking great and we had healed it out, but she has not been offloading appropriately and has a deep tissue injury on the right heel again. The area on her right elbow has opened out with slough and the area in the thoracic spine is also getting worse. 05/30/2015 -- she has developed 2 new ulcerations one on her left ischial tuberosity and one on the sacral region. She is still working on getting up smoking but is also unable to take her vitamins as she says she develops a diarrhea when she takes vitamins. She  has increased her intake of proteins. 06/06/2015 -- the patient's husband manages to get her a low air loss mattress with initiating pressure but did not know how to use it exactly and the patient was not happy about using it. Overall she says she's been feeling better. 07/11/2015 -- . Discussed a surgical opinion for debridement and application of a wound VAC, 2 weeks ago but  the patient has been reluctant to get a surgical opinion as she wants to avoid surgery. 07/18/2015 -- they have an appointment to see Dr. Tamala Julian this coming Wednesday. 07/29/2015 -- they saw Dr. Tamala Julian in his office and he did a debridement of the wound and removed significant amount of slough. This is in addition to the debridement I had done previously on Friday where a large amount of the eschar was removed. He did not recommend the application of wound VAC. Addendum : Dr. Thompson Caul note was reviewed via EP[facility] and details noted as above. 08/05/2015 -- over the last week she has developed a another pressure injury to her right hip and has had significant discoloration of the skin and an eschar there. 08/15/2015 -- she is still smoking about a pack of cigarettes a day and does not seem to want to quit. They have not been able to talk to the vendor regarding the air mattress and I will ask them to get in touch again. She is reluctant to take vitamins and does admit her nutrition is poor. 08/22/2015 -- she has been unable to tolerate her vitamins and continues to smoke. They're working on getting a low air loss mattress and have been speaking with the vendor. 09/19/2015 -- since her last visit and was admitted to the hospital between 09/09/2015 and 09/16/2015. She was thought to have an active sepsis possibly from one of her decubitus ulcers but nothing was grown except for an MSSA from her thoracic spine region. CT of this area did not show any osteomyelitis. Been given IV antibiotics which included vancomycin and Zosyn in the  hospital under the care of Dr. [name] the ID specialist she was sent home on oral Keflex 500 mg 3 times a day for 2 weeks. he will consider an MRI in the outpatient setting to completely rule out osteomyelitis of the spine. 10/03/2015 --readmitted to hospital on 09/23/2015 for general debility, lethargy and possible sepsis and workup was in progress. Seen by Dr. [name] and he would also like a workup for underlying malignancy as she s had previous ultrasounds of the breasts suggesting findings of concern. Workup done so far does not suggest deep bony infection. She was treated for a pneumonia and received Zosyn and vancomycin. She had been recommended Cipro 500 twice a day and doxycycline 100 mg twice a day once she was to be [place]. The antibiotics were to be stopped on 10/07/2015. 10/17/2015 -- patient is feeling much better and has been eating better and doing her offloading as much as possible. 10/23/2015 -- she has an appointment to see Dr. [name] tomorrow but other than that has been doing as much as possible with offloading and increasing her diet. 11/07/2015 -- she has not been here to see [place] for 2 weeks and at the present time continues to try and eat better, work on off loading and is using the air mattress at night. She saw her PCP yesterday and she has put her back on a fentanyl patch. 11/21/2015 -- for some strange reason she has been sitting in a chair with her legs dependent for the last 6 days nonstop and has developed massive lower extremity edema and pedal edema with weeping and ulceration. Nepomuceno, Narelle H. ([id number]) 02/13/2016 -- it's been 3 weeks since I saw her last and overall she's done well except for a right heel where she has had some new ulcerations. Objective Constitutional Pulse regular. Respirations normal and unlabored. Afebrile. Vitals Time Taken: 3:18  PM, Height: 65 in, Weight: 100 lbs, BMI: 16.6, Temperature: 98.2 F, Pulse: 108 bpm,  Respiratory Rate: 18 breaths/min, Blood Pressure: 89/60 mmHg. Eyes Nonicteric. Reactive to light. Ears, Nose, Mouth, and Throat Lips, teeth, and gums WNL.Marland Kitchen Moist mucosa without lesions. Neck supple and nontender. No palpable supraclavicular or cervical adenopathy. Normal sized without goiter. Respiratory WNL. No retractions.. Cardiovascular Pedal Pulses WNL. No clubbing, cyanosis or edema. Lymphatic No adneopathy. No adenopathy. No adenopathy. Musculoskeletal Adexa without tenderness or enlargement.. Digits and nails w/o clubbing, cyanosis, infection, petechiae, ischemia, or inflammatory conditions.Marland Kitchen Psychiatric Judgement and insight Intact.. No evidence of depression, anxiety, or agitation.. General Notes: wounds on both heels, right hip are looking excellent and no debridement was required. We will use Prisma AG on these wounds. Wound on the thoracic spine has some necrotic debris on the right lateral edge and this was sharply debrided with a forcep and 15 blade. Son however is the fact that I can palpate bone in the midline to as the superior edge and this is possibly the spinous process. Integumentary (Hair, Skin) Azar, Jaleen H. ([id number]) No suspicious lesions. No crepitus or fluctuance. No peri-wound warmth or erythema. No masses.. Wound #11 status is Open. Original cause of wound was Pressure Injury. The wound is located on the Right,Distal Calcaneus. The wound measures 1.1cm length x 1.1cm width x 0.1cm depth; 0.95cm^2 area and 0.095cm^3 volume. The wound is limited to skin breakdown. There is no tunneling or undermining noted. There is a small amount of serous drainage noted. The wound margin is flat and intact. There is large (67-100%) granulation within the wound bed. There is a small (1-33%) amount of necrotic tissue within the wound bed including Adherent Slough. The periwound skin appearance exhibited: Localized Edema, Dry/Scaly. The periwound skin appearance did not  exhibit: Callus, Crepitus, Excoriation, Fluctuance, Friable, Induration, Rash, Scarring, Maceration, Moist, Atrophie Blanche, Cyanosis, Ecchymosis, Hemosiderin Staining, Mottled, Pallor, Rubor, Erythema. Periwound temperature was noted as No Abnormality. The periwound has tenderness on palpation. Wound #15 status is Open. Original cause of wound was Pressure Injury. The wound is located on the Right,Medial Calcaneus. The wound measures 0.9cm length x 0.9cm width x 0.1cm depth; 0.636cm^2 area and 0.064cm^3 volume. The wound is limited to skin breakdown. There is no tunneling or undermining noted. There is a small amount of serous drainage noted. The wound margin is flat and intact. There is large (67-100%) red granulation within the wound bed. There is no necrotic tissue within the wound bed. The periwound skin appearance did not exhibit: Callus, Crepitus, Excoriation, Fluctuance, Friable, Induration, Localized Edema, Rash, Scarring, Dry/Scaly, Maceration, Moist, Atrophie Blanche, Cyanosis, Ecchymosis, Hemosiderin Staining, Mottled, Pallor, Rubor, Erythema. Periwound temperature was noted as No Abnormality. The periwound has tenderness on palpation. Wound #3 status is Open. Original cause of wound was Pressure Injury. The wound is located on the Midline Back. The wound measures 2.4cm length x 3.5cm width x 0.2cm depth; 6.597cm^2 area and 1.319cm^3 volume. The wound is limited to skin breakdown. There is no tunneling or undermining noted. The wound margin is flat and intact. There is large (67-100%) red, pink granulation within the wound bed. There is a small (1-33%) amount of necrotic tissue within the wound bed including Adherent Slough. The periwound skin appearance did not exhibit: Callus, Crepitus, Excoriation, Fluctuance, Friable, Induration, Localized Edema, Rash, Scarring, Dry/Scaly, Maceration, Moist, Atrophie Blanche, Cyanosis, Ecchymosis, Hemosiderin Staining, Mottled, Pallor, Rubor,  Erythema. Periwound temperature was noted as No Abnormality. The periwound has tenderness on palpation. Wound #7  status is Open. Original cause of wound was Pressure Injury. The wound is located on the Right Ischial Tuberosity. The wound measures 0.1cm length x 0.5cm width x 0.3cm depth; 0.039cm^2 area and 0.012cm^3 volume. The wound is limited to skin breakdown. There is no tunneling or undermining noted. There is a medium amount of serous drainage noted. The wound margin is flat and intact. There is large (67-100%) pink granulation within the wound bed. There is no necrotic tissue within the wound bed. The periwound skin appearance did not exhibit: Callus, Crepitus, Excoriation, Fluctuance, Friable, Induration, Localized Edema, Rash, Scarring, Dry/Scaly, Maceration, Moist, Atrophie Blanche, Cyanosis, Ecchymosis, Hemosiderin Staining, Mottled, Pallor, Rubor, Erythema. Periwound temperature was noted as No Abnormality. The periwound has tenderness on palpation. Assessment Active Problems Drone, Cathy H. ([id number]) ICD-10 E11.621 - Type 2 diabetes mellitus with foot ulcer F17.218 - Nicotine dependence, cigarettes, with other nicotine-induced disorders L89.100 - Pressure ulcer of unspecified part of back, unstageable L89.213 - Pressure ulcer of right hip, stage 3 L89.613 - Pressure ulcer of right heel, stage 3 The wounds on the right lateral hip and both calcaneal areas are very clean and we will use Prisma AG. the thoracic spine area is now exposed and bone is protruding through and this is now a stage IV pressure ulcer. There is still some subcutaneous debris and we will treat this with Santyl ointment. I have taken the husband aside and discussed this new finding and have asked him to have a family and so and let me know how they would like to proceed. Aggressive therapy would include an MRI study and depending on the results we may have to think of operative debridement, long-term  antibiotics and possibly hyperbaric oxygen therapy. He will come and meet with me early next week and discuss options. Procedures Wound #3 Wound #3 is a Pressure Ulcer located on the Midline Back . There was a Skin/Subcutaneous Tissue Debridement [id number]) debridement with total area of 8.4 sq cm performed by Christin Fudge, MD. with the following instrument(s): Blade and Forceps to remove Viable and Non-Viable tissue/material including Fibrin/Slough, Eschar, and Subcutaneous after achieving pain control using Lidocaine 4% Topical Solution. A time out was conducted prior to the start of the procedure. A Minimum amount of bleeding was controlled with Pressure. The procedure was tolerated well with a pain level of 0 throughout and a pain level of 0 following the procedure. Post Debridement Measurements: 2.4cm length x 3.5cm width x 0.2cm depth; 1.319cm^3 volume. Post debridement Stage noted as Category/Stage IV. Post procedure Diagnosis Wound #3: Same as Pre-Procedure General Notes: in the midline towards the superior edge that is palpable bone this is possibly the spinous processes which are now exposed.. Plan Wound Cleansing: Wound #11 Right,Distal Calcaneus: Clean wound with Normal Saline. Wound #15 Right,Medial Calcaneus: Clean wound with Normal Saline. Wound #3 Midline Back: Menefee, Deshea H. ([id number]) Clean wound with Normal Saline. Wound #7 Right Ischial Tuberosity: Clean wound with Normal Saline. Anesthetic: Wound #11 Right,Distal Calcaneus: Topical Lidocaine 4% cream applied to wound bed prior to debridement - applied in clinic only Wound #15 Right,Medial Calcaneus: Topical Lidocaine 4% cream applied to wound bed prior to debridement - applied in clinic only Wound #3 Midline Back: Topical Lidocaine 4% cream applied to wound bed prior to debridement - applied in clinic only Wound #7 Right Ischial Tuberosity: Topical Lidocaine 4% cream applied to wound bed prior to  debridement - applied in clinic only Primary Wound Dressing: Wound #11 Right,Distal Calcaneus: Prisma Ag Wound #  15 Right,Medial Calcaneus: Prisma Ag Wound #7 Right Ischial Tuberosity: Prisma Ag Wound #3 Midline Back: Santyl Ointment Secondary Dressing: Wound #11 Right,Distal Calcaneus: Boardered Foam Dressing Wound #15 Right,Medial Calcaneus: Boardered Foam Dressing Wound #3 Midline Back: Boardered Foam Dressing Wound #7 Right Ischial Tuberosity: Boardered Foam Dressing Dressing Change Frequency: Wound #11 Right,Distal Calcaneus: Change dressing every other day. Wound #15 Right,Medial Calcaneus: Change dressing every other day. Wound #3 Midline Back: Change dressing every day. Wound #7 Right Ischial Tuberosity: Change dressing every day. Follow-up Appointments: Wound #11 Right,Distal Calcaneus: Return Appointment in 1 week. Wound #15 Right,Medial Calcaneus: Return Appointment in 1 week. Wound #3 Midline Back: Return Appointment in 1 week. Wound #7 Right Ischial Tuberosity: Return Appointment in 1 week. Off-Loading: Wound #11 Right,Distal Calcaneus: Turn and reposition every 2 hours - Keep pressure off of back and heels Maradiaga, Darriona H. ([id number]) Wound #15 Right,Medial Calcaneus: Turn and reposition every 2 hours - Keep pressure off of back and heels Wound #3 Midline Back: Turn and reposition every 2 hours - Keep pressure off of back and heels Wound #7 Right Ischial Tuberosity: Turn and reposition every 2 hours - Keep pressure off of back and heels Additional Orders / Instructions: Wound #11 Right,Distal Calcaneus: Stop Smoking Increase protein intake. Wound #15 Right,Medial Calcaneus: Stop Smoking Increase protein intake. Wound #3 Midline Back: Stop Smoking Increase protein intake. Wound #7 Right Ischial Tuberosity: Stop Smoking Increase protein intake. Home Health: Wound #11 Right,Distal Calcaneus: Continue Home Health Visits - [facility]  Nurse may visit PRN to address patient s wound care needs. FACE TO FACE ENCOUNTER: MEDICARE and MEDICAID PATIENTS: I certify that this patient is under my care and that I had a face-to-face encounter that meets the physician face-to-face encounter requirements with this patient on this date. The encounter with the patient was in whole or in part for the following MEDICAL CONDITION: (primary reason for [place]) MEDICAL NECESSITY: I certify, that based on my findings, NURSING services are a medically necessary home health service. HOME BOUND STATUS: I certify that my clinical findings support that this patient is homebound (i.e., Due to illness or injury, pt requires aid of supportive devices such as crutches, cane, wheelchairs, walkers, the use of special transportation or the assistance of another person to leave their place of residence. There is a normal inability to leave the home and doing so requires considerable and taxing effort. Other absences are for medical reasons / religious services and are infrequent or of short duration when for other reasons). If current dressing causes regression in wound condition, may D/C ordered dressing product/s and apply Normal Saline Moist Dressing daily until next [place] / Other MD appointment. [place] of regression in wound condition at [phone]. Please direct any NON-WOUND related issues/requests for orders to patient's Primary Care Physician Wound #15 Right,Medial Calcaneus: [place] Visits - [facility] Nurse may visit PRN to address patient s wound care needs. FACE TO FACE ENCOUNTER: MEDICARE and MEDICAID PATIENTS: I certify that this patient is under my care and that I had a face-to-face encounter that meets the physician face-to-face encounter requirements with this patient on this date. The encounter with the patient was in whole or in part for the following MEDICAL CONDITION:  (primary reason for [place]) MEDICAL NECESSITY: I certify, that based on my findings, NURSING services are a medically necessary home health service. HOME BOUND STATUS: I certify that my clinical findings support that this patient  is homebound (i.e., Due to illness or injury, pt requires aid of supportive devices such as crutches, cane, wheelchairs, walkers, the use of special transportation or the assistance of another person to leave their place of residence. There is a normal inability to leave the home and doing so requires considerable and taxing effort. Other absences are for medical reasons / religious services and are infrequent or of short duration when for other reasons). If current dressing causes regression in wound condition, may D/C ordered dressing product/s and apply Burback, Kalese H. ([id number]) Normal Saline Moist Dressing daily until next [place] / Other MD appointment. [place] of regression in wound condition at [phone]. Please direct any NON-WOUND related issues/requests for orders to patient's Primary Care Physician Wound #3 Midline Back: [place] Visits - [facility] Nurse may visit PRN to address patient s wound care needs. FACE TO FACE ENCOUNTER: MEDICARE and MEDICAID PATIENTS: I certify that this patient is under my care and that I had a face-to-face encounter that meets the physician face-to-face encounter requirements with this patient on this date. The encounter with the patient was in whole or in part for the following MEDICAL CONDITION: (primary reason for [place]) MEDICAL NECESSITY: I certify, that based on my findings, NURSING services are a medically necessary home health service. HOME BOUND STATUS: I certify that my clinical findings support that this patient is homebound (i.e., Due to illness or injury, pt requires aid of supportive devices such as crutches, cane, wheelchairs, walkers, the  use of special transportation or the assistance of another person to leave their place of residence. There is a normal inability to leave the home and doing so requires considerable and taxing effort. Other absences are for medical reasons / religious services and are infrequent or of short duration when for other reasons). If current dressing causes regression in wound condition, may D/C ordered dressing product/s and apply Normal Saline Moist Dressing daily until next [place] / Other MD appointment. [place] of regression in wound condition at [phone]. Please direct any NON-WOUND related issues/requests for orders to patient's Primary Care Physician Wound #7 Right Ischial Tuberosity: [place] Visits - [facility] Nurse may visit PRN to address patient s wound care needs. FACE TO FACE ENCOUNTER: MEDICARE and MEDICAID PATIENTS: I certify that this patient is under my care and that I had a face-to-face encounter that meets the physician face-to-face encounter requirements with this patient on this date. The encounter with the patient was in whole or in part for the following MEDICAL CONDITION: (primary reason for [place]) MEDICAL NECESSITY: I certify, that based on my findings, NURSING services are a medically necessary home health service. HOME BOUND STATUS: I certify that my clinical findings support that this patient is homebound (i.e., Due to illness or injury, pt requires aid of supportive devices such as crutches, cane, wheelchairs, walkers, the use of special transportation or the assistance of another person to leave their place of residence. There is a normal inability to leave the home and doing so requires considerable and taxing effort. Other absences are for medical reasons / religious services and are infrequent or of short duration when for other reasons). If current dressing causes regression in wound condition, may  D/C ordered dressing product/s and apply Normal Saline Moist Dressing daily until next [place] / Other MD appointment. [place] of regression in wound condition at [phone]. Please  direct any NON-WOUND related issues/requests for orders to patient's Primary Care Physician The wounds on the right lateral hip and both calcaneal areas are very clean and we will use Prisma AG. the thoracic spine area is now exposed and bone is protruding through and this is now a stage IV pressure ulcer. There is still some subcutaneous debris and we will treat this with Santyl ointment. I have taken the husband aside and discussed this new finding and have asked him to have a family and so and let me know how they would like to proceed. Aggressive therapy would include an MRI study and Armor, Tashi H. ([id number]) depending on the results we may have to think of operative debridement, long-term antibiotics and possibly hyperbaric oxygen therapy. He will come and meet with me early next week and discuss options. Electronic Signature(s) Signed: 04/09/2016 4:07:41 PM By: Christin Fudge MD, FACS Entered By: Christin Fudge on 04/09/2016 16:07:41 Prindle, Kimball H. ([id number]) -------------------------------------------------------------------------------- SuperBill Details Patient Name: [name], [name] Date of Service: 04/09/2016 Medical Record Number: [id number] Patient Account Number: [id number] Date of Birth/Sex: [birth year] (70 y.o. Female) Treating RN: Montey Hora Primary Care Physician: Paulita Cradle Other Clinician: Referring Physician: Paulita Cradle Treating Physician/Extender: Frann Rider in Treatment: 56 Diagnosis Coding ICD-10 Codes Code Description E11.621 Type 2 diabetes mellitus with foot ulcer F17.218 Nicotine dependence, cigarettes, with other nicotine-induced disorders L89.100 Pressure ulcer of unspecified part of back, unstageable L89.213  Pressure ulcer of right hip, stage 3 L89.613 Pressure ulcer of right heel, stage 3 Facility Procedures CPT4 Code: [id number] Description: [id number] - DEB SUBQ TISSUE 20 SQ CM/< ICD-10 Description Diagnosis 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.100 Pressure ulcer of unspecified part of back, uns L89.213 Pressure ulcer of right hip, stage 3 L89.613 Pressure ulcer of  right heel, stage 3 Modifier: tageable Quantity: 1 Physician Procedures CPT4 Code: [id number] Description: [id number] - WC PHYS SUBQ TISS 20 SQ CM ICD-10 Description Diagnosis 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.100 Pressure ulcer of unspecified part of back, uns 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

## 2016-04-16 ENCOUNTER — Encounter: Payer: Medicare Other | Attending: Surgery | Admitting: Surgery

## 2016-04-16 DIAGNOSIS — F17218 Nicotine dependence, cigarettes, with other nicotine-induced disorders: Secondary | ICD-10-CM | POA: Insufficient documentation

## 2016-04-16 DIAGNOSIS — E11621 Type 2 diabetes mellitus with foot ulcer: Secondary | ICD-10-CM | POA: Insufficient documentation

## 2016-04-16 DIAGNOSIS — L89213 Pressure ulcer of right hip, stage 3: Secondary | ICD-10-CM | POA: Diagnosis not present

## 2016-04-16 DIAGNOSIS — E1140 Type 2 diabetes mellitus with diabetic neuropathy, unspecified: Secondary | ICD-10-CM | POA: Diagnosis not present

## 2016-04-16 DIAGNOSIS — I10 Essential (primary) hypertension: Secondary | ICD-10-CM | POA: Diagnosis not present

## 2016-04-16 DIAGNOSIS — F1021 Alcohol dependence, in remission: Secondary | ICD-10-CM | POA: Insufficient documentation

## 2016-04-16 DIAGNOSIS — L89613 Pressure ulcer of right heel, stage 3: Secondary | ICD-10-CM | POA: Diagnosis not present

## 2016-04-16 DIAGNOSIS — M810 Age-related osteoporosis without current pathological fracture: Secondary | ICD-10-CM | POA: Diagnosis not present

## 2016-04-16 DIAGNOSIS — L89100 Pressure ulcer of unspecified part of back, unstageable: Secondary | ICD-10-CM | POA: Insufficient documentation

## 2016-04-17 NOTE — Progress Notes (Signed)
VANHORNE, Jaylanni H. ([id number]) Visit Report for 04/16/2016 Arrival Information Details Patient Name: [name], [name] Date of Service: 04/16/2016 3:00 PM Medical Record Number: [id number] Patient Account Number: [id number] Date of Birth/Sex: Jul 05, 1946 (70 y.o. Female) Treating RN: Montey Hora Primary Care Physician: Paulita Cradle Other Clinician: Referring Physician: Paulita Cradle Treating Physician/Extender: Frann Rider in Treatment: 70 Visit Information History Since Last Visit Added or deleted any medications: No Patient Arrived: Wheel Chair Any new allergies or adverse reactions: No Arrival Time: 15:08 Had a fall or experienced change in No activities of daily living that may affect Accompanied By: spouse risk of falls: Transfer Assistance: Manual Signs or symptoms of abuse/neglect since last No Patient Identification Verified: Yes visito Secondary Verification Process Yes Hospitalized since last visit: No Completed: Pain Present Now: Yes Patient Requires Transmission-Based No Precautions: Patient Has Alerts: No Electronic Signature(s) Signed: 04/16/2016 4:47:48 PM By: Montey Hora Entered By: Montey Hora on 04/16/2016 15:11:12 [name], [name] ([id number]) -------------------------------------------------------------------------------- Clinic Level of Care Assessment Details Patient Name: Schnorr, Massa H. Date of Service: 04/16/2016 3:00 PM Medical Record Number: [id number] Patient Account Number: [id number] Date of Birth/Sex: 09/07/1946 (70 y.o. Female) Treating RN: Montey Hora Primary Care Physician: Paulita Cradle Other Clinician: Referring Physician: Paulita Cradle Treating Physician/Extender: Frann Rider in Treatment: 53 Clinic Level of Care Assessment Items TOOL 4 Quantity Score []  - Use when only an EandM is performed on FOLLOW-UP visit 0 ASSESSMENTS - Nursing Assessment / Reassessment X - Reassessment of Co-morbidities  (includes updates in patient status) 1 10 X - Reassessment of Adherence to Treatment Plan 1 5 ASSESSMENTS - Wound and Skin Assessment / Reassessment []  - Simple Wound Assessment / Reassessment - one wound 0 X - Complex Wound Assessment / Reassessment - multiple wounds 4 5 []  - Dermatologic / Skin Assessment (not related to wound area) 0 ASSESSMENTS - Focused Assessment []  - Circumferential Edema Measurements - multi extremities 0 []  - Nutritional Assessment / Counseling / Intervention 0 []  - Lower Extremity Assessment (monofilament, tuning fork, pulses) 0 []  - Peripheral Arterial Disease Assessment (using hand held doppler) 0 ASSESSMENTS - Ostomy and/or Continence Assessment and Care []  - Incontinence Assessment and Management 0 []  - Ostomy Care Assessment and Management (repouching, etc.) 0 PROCESS - Coordination of Care X - Simple Patient / Family Education for ongoing care 1 15 []  - Complex (extensive) Patient / Family Education for ongoing care 0 []  - Staff obtains Programmer, systems, Records, Test Results / Process Orders 0 []  - Staff telephones HHA, Nursing Homes / Clarify orders / etc 0 []  - Routine Transfer to another Facility (non-emergent condition) 0 [name], [name] ([id number]) []  - Routine Hospital Admission (non-emergent condition) 0 []  - New Admissions / Biomedical engineer / Ordering NPWT, Apligraf, etc. 0 []  - Emergency Hospital Admission (emergent condition) 0 X - Simple Discharge Coordination 1 10 []  - Complex (extensive) Discharge Coordination 0 PROCESS - Special Needs []  - Pediatric / Minor Patient Management 0 []  - Isolation Patient Management 0 []  - Hearing / Language / Visual special needs 0 []  - Assessment of Community assistance (transportation, D/C planning, etc.) 0 []  - Additional assistance / Altered mentation 0 []  - Support Surface(s) Assessment (bed, cushion, seat, etc.) 0 INTERVENTIONS - Wound Cleansing / Measurement []  - Simple Wound Cleansing - one wound  0 X - Complex Wound Cleansing - multiple wounds 4 5 X - Wound Imaging (photographs - any number of wounds) 1 5 []  - Wound Tracing (instead of photographs) 0 []  -  Simple Wound Measurement - one wound 0 X - Complex Wound Measurement - multiple wounds 4 5 INTERVENTIONS - Wound Dressings X - Small Wound Dressing one or multiple wounds 4 10 []  - Medium Wound Dressing one or multiple wounds 0 []  - Large Wound Dressing one or multiple wounds 0 []  - Application of Medications - topical 0 []  - Application of Medications - injection 0 INTERVENTIONS - Miscellaneous []  - External ear exam 0 [name], [name] ([id number]) []  - Specimen Collection (cultures, biopsies, blood, body fluids, etc.) 0 []  - Specimen(s) / Culture(s) sent or taken to Lab for analysis 0 []  - Patient Transfer (multiple staff / [name] / Similar devices) 0 []  - Simple Staple / Suture removal (25 or less) 0 []  - Complex Staple / Suture removal (26 or more) 0 []  - Hypo / Hyperglycemic Management (close monitor of Blood Glucose) 0 []  - Ankle / Brachial Index (ABI) - do not check if billed separately 0 X - Vital Signs 1 5 Has the patient been seen at the hospital within the last three years: Yes Total Score: 150 Level Of Care: New/Established - Level 4 Electronic Signature(s) Signed: 04/16/2016 4:47:48 PM By: Montey Hora Entered By: Montey Hora on 04/16/2016 16:32:09 [name], [name] ([id number]) -------------------------------------------------------------------------------- Encounter Discharge Information Details Patient Name: [name], [name] Date of Service: 04/16/2016 3:00 PM Medical Record Number: [id number] Patient Account Number: [id number] Date of Birth/Sex: Mar 09, 1946 (70 y.o. Female) Treating RN: Montey Hora Primary Care Physician: Paulita Cradle Other Clinician: Referring Physician: Paulita Cradle Treating Physician/Extender: Frann Rider in Treatment: 48 Encounter Discharge Information  Items Discharge Pain Level: 0 Discharge Condition: Stable Ambulatory Status: Wheelchair Discharge Destination: Home Transportation: Private Auto Accompanied By: spouse Schedule Follow-up Appointment: Yes Medication Reconciliation completed No and provided to Patient/Care Edward Trevino: Provided on Clinical Summary of Care: 04/16/2016 Form Type Recipient Paper Patient AS Electronic Signature(s) Signed: 04/16/2016 4:33:30 PM By: Montey Hora Previous Signature: 04/16/2016 4:09:36 PM Version By: Ruthine Dose Entered By: Montey Hora on 04/16/2016 16:33:30 [name], [name] ([id number]) -------------------------------------------------------------------------------- Multi Wound Chart Details Patient Name: [name], [name] Date of Service: 04/16/2016 3:00 PM Medical Record Number: [id number] Patient Account Number: [id number] Date of Birth/Sex: August 15, 1946 (70 y.o. 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] [15:Pressure Ulcer] [3:Pressure Ulcer] Comorbid History: [11:Cataracts, Asthma, Hypertension, Type II Diabetes, Neuropathy, Received Chemotherapy] [15:Cataracts, Asthma, Hypertension, Type II Diabetes, Neuropathy, Received Chemotherapy] [3:Cataracts, Asthma, Hypertension, Type II Diabetes,  Neuropathy, Received Chemotherapy] Date Acquired: [11:09/29/2015] [15:02/18/2016] [3:05/02/2015] Weeks of  Treatment: [11:28] [15:7] [3:50] Wound Status: [11:Open] [15:Open] [3:Open] Clustered Wound: [11:Yes] [15:No] [3:No] Measurements L x W x D 0.9x0.8x0.1 [15:0.6x0.7x0.1] [3:3.9x2.8x0.2] (cm) Area (cm) : [11:0.565] [15:0.33] [3:8.577] Volume (cm) : [11:0.057] [15:0.033] [3:1.715] % Reduction in Area: [11:20.10%] [15:-423.80%] [3:-3798.60%] % Reduction in Volume: 19.70% [15:-450.00%] [3:-7695.50%] Classification: [11:Category/Stage II] [15:Category/Stage II] [3:Category/Stage IV] HBO Classification: [11:Grade 1] [15:Grade 1] [3:N/A] Exudate Amount: [11:Small] [15:Small] [3:N/A] Exudate Type: [11:Serous] [15:Serous] [3:N/A] Exudate Color: [11:amber] [15:amber] [3:N/A] Wound Margin: [11:Flat and Intact] [15:Flat and Intact] [3:Flat and Intact] Granulation Amount: [11:Large (67-100%)] [15:Large (67-100%)] [3:Large (67-100%)] Granulation Quality: [  11:Hyper-granulation] [15:Red] [3:Red, Pink] Necrotic Amount: [11:Small (1-33%)] [15:None Present (0%)] [3:Small (1-33%)] Exposed Structures: [11:Fascia: No Fat: No Tendon: No] [15:Fascia: No Fat: No Tendon: No] [3:Bone: Yes Fascia: No Fat: No] Muscle: No Muscle: No Tendon: No Joint: No Joint: No Muscle: No Bone: No Bone: No Joint: No Limited to Skin Limited to Skin Breakdown Breakdown Epithelialization: None None None Periwound Skin Texture: Edema: Yes Edema: No Edema: No Excoriation: No Excoriation: No Excoriation: No Induration: No Induration: No Induration: No Callus: No Callus: No Callus: No Crepitus: No Crepitus: No Crepitus: No Fluctuance: No Fluctuance: No Fluctuance: No Friable: No Friable: No Friable: No Rash: No Rash: No Rash: No Scarring: No Scarring: No Scarring: No Periwound Skin Dry/Scaly: Yes Maceration: No Maceration: No Moisture: Maceration: No Moist: No Moist: No Moist: No Dry/Scaly: No Dry/Scaly: No Periwound Skin Color: Atrophie Blanche: No Atrophie Blanche: No Erythema: Yes Cyanosis:  No Cyanosis: No Atrophie Blanche: No Ecchymosis: No Ecchymosis: No Cyanosis: No Erythema: No Erythema: No Ecchymosis: No Hemosiderin Staining: No Hemosiderin Staining: No Hemosiderin Staining: No Mottled: No Mottled: No Mottled: No Pallor: No Pallor: No Pallor: No Rubor: No Rubor: No Rubor: No Erythema Location: N/A N/A Circumferential Temperature: No Abnormality No Abnormality No Abnormality Tenderness on Yes Yes Yes Palpation: Wound Preparation: Ulcer Cleansing: Ulcer Cleansing: Ulcer Cleansing: Rinsed/Irrigated with Rinsed/Irrigated with Rinsed/Irrigated with Saline Saline Saline Topical Anesthetic Topical Anesthetic Topical Anesthetic Applied: Other: lidocaine Applied: Other: lidocaine Applied: Other: lidocaine 4% 4% 4% Wound Number: 7 N/A N/A Photos: No Photos N/A N/A Wound Location: Right Ischial Tuberosity N/A N/A Wounding Event: Pressure Injury N/A N/A Primary Etiology: Pressure Ulcer N/A N/A Comorbid History: Cataracts, Asthma, N/A N/A Hypertension, Type II Diabetes, Neuropathy, Received Chemotherapy Date Acquired: 08/01/2015 N/A N/A Weeks of Treatment: 36 N/A N/A Wound Status: Open N/A N/A [name], [name] ([id number]) Clustered Wound: No N/A N/A Measurements L x W x D 0.1x0.5x0.3 N/A N/A (cm) Area (cm) : 0.039 N/A N/A Volume (cm) : 0.012 N/A N/A % Reduction in Area: 98.80% N/A N/A % Reduction in Volume: 96.20% N/A N/A Classification: Category/Stage III N/A N/A HBO Classification: N/A N/A N/A Exudate Amount: Medium N/A N/A Exudate Type: Serous N/A N/A Exudate Color: amber N/A N/A Wound Margin: Flat and Intact N/A N/A Granulation Amount: Large (67-100%) N/A N/A Granulation Quality: Pink N/A N/A Necrotic Amount: None Present (0%) N/A N/A Exposed Structures: Fascia: No N/A N/A Fat: No Tendon: No Muscle: No Joint: No Bone: No Limited to Skin Breakdown Epithelialization: None N/A N/A Periwound Skin Texture: Edema: No N/A N/A Excoriation:  No Induration: No Callus: No Crepitus: No Fluctuance: No Friable: No Rash: No Scarring: No Periwound Skin Maceration: No N/A N/A Moisture: Moist: No Dry/Scaly: No Periwound Skin Color: Atrophie Blanche: No N/A N/A Cyanosis: No Ecchymosis: No Erythema: No Hemosiderin Staining: No Mottled: No Pallor: No Rubor: No Erythema Location: N/A N/A N/A Temperature: No Abnormality N/A N/A Tenderness on Yes N/A N/A Palpation: Wound Preparation: N/A N/A [name], [name] ([id number]) Ulcer Cleansing: Rinsed/Irrigated with Saline Topical Anesthetic Applied: Other: lidocaine 4% Treatment Notes Electronic Signature(s) Signed: 04/16/2016 4:47:48 PM By: Montey Hora Entered By: Montey Hora on 04/16/2016 15:45:51 [name], [name] ([id number]) -------------------------------------------------------------------------------- Multi-Disciplinary Care Plan Details Patient Name: [name], Pricila H. Date of Service: 04/16/2016 3:00 PM Medical Record Number: [id number] Patient Account Number: [id number] Date of Birth/Sex: February 28, 1946 (70 y.o. Female) Treating RN: Montey Hora Primary Care Physician: Paulita Cradle Other Clinician: Referring Physician: Paulita Cradle Treating Physician/Extender: Frann Rider in Treatment: 55 Active Inactive  Abuse / Safety / Falls / Self Care Management Nursing Diagnoses: Impaired home maintenance Impaired physical mobility Knowledge deficit related to: safety; personal, health (wound), emergency Potential for falls Self care deficit: actual or potential Goals: Patient will remain injury free Date Initiated: 03/13/2015 Goal Status: Active Patient/caregiver will verbalize understanding of skin care regimen Date Initiated: 03/13/2015 Goal Status: Active Patient/caregiver will verbalize/demonstrate measure taken to improve self care Date Initiated: 03/13/2015 Goal Status: Active Patient/caregiver will verbalize/demonstrate measures taken to  improve the patient's personal safety Date Initiated: 03/13/2015 Goal Status: Active Patient/caregiver will verbalize/demonstrate measures taken to prevent injury and/or falls Date Initiated: 03/13/2015 Goal Status: Active Patient/caregiver will verbalize/demonstrate understanding of what to do in case of emergency Date Initiated: 03/13/2015 Goal Status: Active Interventions: Assess fall risk on admission and as needed Assess: immobility, friction, shearing, incontinence upon admission and as needed Assess impairment of mobility on admission and as needed per policy Assess self care needs on admission and as needed Provide education on basic hygiene Decker, Gargi H. ([id number]) Provide education on fall prevention Provide education on personal and home safety Provide education on safe transfers Treatment Activities: Education provided on Basic Hygiene : 08/05/2015 Notes: Pressure Nursing Diagnoses: Knowledge deficit related to causes and risk factors for pressure ulcer development Knowledge deficit related to management of pressures ulcers Potential for impaired tissue integrity related to pressure, friction, moisture, and shear Goals: Patient will remain free from development of additional pressure ulcers Date Initiated: 03/13/2015 Goal Status: Active Patient will remain free of pressure ulcers Date Initiated: 03/13/2015 Goal Status: Active Patient/caregiver will verbalize risk factors for pressure ulcer development Date Initiated: 03/13/2015 Goal Status: Active Patient/caregiver will verbalize understanding of pressure ulcer management Date Initiated: 03/13/2015 Goal Status: Active Interventions: Assess: immobility, friction, shearing, incontinence upon admission and as needed Assess offloading mechanisms upon admission and as needed Assess potential for pressure ulcer upon admission and as needed Provide education on pressure ulcers Treatment Activities: Patient referred for  home evaluation of offloading devices/mattresses : 01/02/2016 Patient referred for pressure reduction/relief devices : 01/02/2016 Patient referred for seating evaluation to ensure proper offloading : 01/02/2016 Pressure reduction/relief device ordered : 01/02/2016 Test ordered outside of clinic : 01/02/2016 Notes: Wound/Skin Impairment Retzloff, Lyla H. ([id number]) Nursing Diagnoses: Impaired tissue integrity Knowledge deficit related to ulceration/compromised skin integrity Goals: Patient/caregiver will verbalize understanding of skin care regimen Date Initiated: 03/13/2015 Goal Status: Active Ulcer/skin breakdown will have a volume reduction of 30% by week 4 Date Initiated: 03/13/2015 Goal Status: Active Ulcer/skin breakdown will have a volume reduction of 50% by week 8 Date Initiated: 03/13/2015 Goal Status: Active Ulcer/skin breakdown will have a volume reduction of 80% by week 12 Date Initiated: 03/13/2015 Goal Status: Active Ulcer/skin breakdown will heal within 14 weeks Date Initiated: 03/13/2015 Goal Status: Active Interventions: Assess patient/caregiver ability to obtain necessary supplies Assess patient/caregiver ability to perform ulcer/skin care regimen upon admission and as needed Assess ulceration(s) every visit Provide education on smoking Provide education on ulcer and skin care Treatment Activities: Patient referred to home care : 01/02/2016 Referred to DME Zyhir Cappella for dressing supplies : 01/02/2016 Skin care regimen initiated : 01/02/2016 Topical wound management initiated : 01/02/2016 Notes: Electronic Signature(s) Signed: 04/16/2016 4:47:48 PM By: Montey Hora Entered By: Montey Hora on 04/16/2016 15:45:41 [name], [name] H. ([id number]) -------------------------------------------------------------------------------- Pain Assessment Details Patient Name: [name], Martita H. Date of Service: 04/16/2016 3:00 PM Medical Record Number: [id number] Patient Account Number:  [id number] Date of Birth/Sex: 12/15/1945 (70 y.o. Female) Treating RN: Montey Hora Primary  Care Physician: Paulita Cradle Other Clinician: Referring Physician: Paulita Cradle Treating Physician/Extender: Frann Rider in Treatment: 61 Active Problems Location of Pain Severity and Description of Pain Patient Has Paino Yes Site Locations Pain Location: Generalized Pain Pain Management and Medication Current Pain Management: Notes Topical or injectable lidocaine is offered to patient for acute pain when surgical debridement is performed. If needed, Patient is instructed to use over the counter pain medication for the following 24-48 hours after debridement. Wound care MDs do not prescribed pain medications. Patient has chronic pain or uncontrolled pain. Patient has been instructed to make an appointment with their Primary Care Physician for pain management. Electronic Signature(s) Signed: 04/16/2016 4:47:48 PM By: Montey Hora Entered By: Montey Hora on 04/16/2016 15:11:36 Dukeman, Tashauna H. ([id number]) -------------------------------------------------------------------------------- Patient/Caregiver Education Details Patient Name: [name], Chaniya H. Date of Service: 04/16/2016 3:00 PM Medical Record Number: [id number] Patient Account Number: [id number] Date of Birth/Gender: [birth year] (70 y.o. Female) Treating RN: Montey Hora Primary Care Physician: Paulita Cradle Other Clinician: Referring Physician: Paulita Cradle Treating Physician/Extender: Frann Rider in Treatment: 36 Education Assessment Education Provided To: Patient and Caregiver Education Topics Provided Wound/Skin Impairment: Handouts: Other: wound care as ordered Methods: Demonstration, Explain/Verbal, Printed Responses: State content correctly Electronic Signature(s) Signed: 04/16/2016 4:47:48 PM By: Montey Hora Entered By: Montey Hora on 04/16/2016 16:33:49 Vanaken, Jakaria H.  ([id number]) -------------------------------------------------------------------------------- Wound Assessment Details Patient Name: Krygier, Elyce H. Date of Service: 04/16/2016 3:00 PM Medical Record Number: [id number] Patient Account Number: [id number] Date of Birth/Sex: 18-Sep-1945 (70 y.o. Female) Treating RN: Montey Hora Primary Care Physician: Paulita Cradle Other Clinician: Referring Physician: Paulita Cradle Treating Physician/Extender: Frann Rider in Treatment: 34 Wound Status Wound Number: 11 Primary Pressure Ulcer Etiology: Wound Location: Right Calcaneus - Distal Wound Open Wounding Event: Pressure Injury Status: Date Acquired: 09/29/2015 Comorbid Cataracts, Asthma, Hypertension, Type Weeks Of Treatment: 28 History: II Diabetes, Neuropathy, Received Clustered Wound: Yes Chemotherapy Photos Wound Measurements Length: (cm) 0.9 Width: (cm) 0.8 Depth: (cm) 0.1 Area: (cm) 0.565 Volume: (cm) 0.057 % Reduction in Area: 20.1% % Reduction in Volume: 19.7% Epithelialization: None Tunneling: No Undermining: No Wound Description Classification: Category/Stage II Foul Odor Af Diabetic Severity (Wagner): Grade 1 Wound Margin: Flat and Intact Exudate Amount: Small Exudate Type: Serous Exudate Color: amber ter Cleansing: No Wound Bed Granulation Amount: Large (67-100%) Exposed Structure Granulation Quality: Hyper-granulation Fascia Exposed: No Necrotic Amount: Small (1-33%) Fat Layer Exposed: No Necrotic Quality: Adherent Slough Tendon Exposed: No Moffa, Desere H. ([id number]) Muscle Exposed: No Joint Exposed: No Bone Exposed: No Limited to Skin Breakdown Periwound Skin Texture Texture Color No Abnormalities Noted: No No Abnormalities Noted: No Callus: No Atrophie Blanche: No Crepitus: No Cyanosis: No Excoriation: No Ecchymosis: No Fluctuance: No Erythema: No Friable: No Hemosiderin Staining: No Induration: No Mottled: No Localized  Edema: Yes Pallor: No Rash: No Rubor: No Scarring: No Temperature / Pain Moisture Temperature: No Abnormality No Abnormalities Noted: No Tenderness on Palpation: Yes Dry / Scaly: Yes Maceration: No Moist: No Wound Preparation Ulcer Cleansing: Rinsed/Irrigated with Saline Topical Anesthetic Applied: Other: lidocaine 4%, Treatment Notes Wound #11 (Right, Distal Calcaneus) 1. Cleansed with: Clean wound with Normal Saline 2. Anesthetic Topical Lidocaine 4% cream to wound bed prior to debridement 4. Dressing Applied: Prisma Ag 5. Secondary Dressing Applied Bordered Foam Dressing Electronic Signature(s) Signed: 04/16/2016 4:47:48 PM By: Montey Hora Entered By: Montey Hora on 04/16/2016 16:28:45 Hellwig, Rochell H. ([id number]) -------------------------------------------------------------------------------- Wound Assessment Details Patient Name: Dazey, Eliannah H. Date of Service: 04/16/2016 3:00 PM  Medical Record Number: [id number] Patient Account Number: [id number] Date of Birth/Sex: [birth year] (70 y.o. Female) Treating RN: Montey Hora Primary Care Physician: Paulita Cradle Other Clinician: Referring Physician: Paulita Cradle Treating Physician/Extender: Frann Rider in Treatment: 53 Wound Status Wound Number: 15 Primary Pressure Ulcer Etiology: Wound Location: Right Calcaneus - Medial Wound Open Wounding Event: Pressure Injury Status: Date Acquired: 02/18/2016 Comorbid Cataracts, Asthma, Hypertension, Type Weeks Of Treatment: 7 History: II Diabetes, Neuropathy, Received Clustered Wound: No Chemotherapy Photos Wound Measurements Length: (cm) 0.6 Width: (cm) 0.7 Depth: (cm) 0.1 Area: (cm) 0.33 Volume: (cm) 0.033 % Reduction in Area: -423.8% % Reduction in Volume: -450% Epithelialization: None Tunneling: No Undermining: No Wound Description Classification: Category/Stage II Foul Odor A Diabetic Severity (Wagner): Grade 1 Wound Margin: Flat and  Intact Exudate Amount: Small Exudate Type: Serous Exudate Color: amber fter Cleansing: No Wound Bed Granulation Amount: Large (67-100%) Exposed Structure Granulation Quality: Red Fascia Exposed: No Necrotic Amount: None Present (0%) Fat Layer Exposed: No Tendon Exposed: No Rosado, Cassandre H. ([id number]) Muscle Exposed: No Joint Exposed: No Bone Exposed: No Limited to Skin Breakdown Periwound Skin Texture Texture Color No Abnormalities Noted: No No Abnormalities Noted: No Callus: No Atrophie Blanche: No Crepitus: No Cyanosis: No Excoriation: No Ecchymosis: No Fluctuance: No Erythema: No Friable: No Hemosiderin Staining: No Induration: No Mottled: No Localized Edema: No Pallor: No Rash: No Rubor: No Scarring: No Temperature / Pain Moisture Temperature: No Abnormality No Abnormalities Noted: No Tenderness on Palpation: Yes Dry / Scaly: No Maceration: No Moist: No Wound Preparation Ulcer Cleansing: Rinsed/Irrigated with Saline Topical Anesthetic Applied: Other: lidocaine 4%, Treatment Notes Wound #15 (Right, Medial Calcaneus) 1. Cleansed with: Clean wound with Normal Saline 2. Anesthetic Topical Lidocaine 4% cream to wound bed prior to debridement 4. Dressing Applied: Prisma Ag 5. Secondary Dressing Applied Bordered Foam Dressing Electronic Signature(s) Signed: 04/16/2016 4:47:48 PM By: Montey Hora Entered By: Montey Hora on 04/16/2016 16:29:08 Virtue, Catrina H. ([id number]) -------------------------------------------------------------------------------- Wound Assessment Details Patient Name: Sova, Felicie H. Date of Service: 04/16/2016 3:00 PM Medical Record Number: [id number] Patient Account Number: [id number] Date of Birth/Sex: 1946/02/25 (70 y.o. Female) Treating RN: Montey Hora Primary Care Physician: Paulita Cradle Other Clinician: Referring Physician: Paulita Cradle Treating Physician/Extender: Frann Rider in Treatment: 47 Wound  Status Wound Number: 3 Primary Pressure Ulcer Etiology: Wound Location: Back - Midline Wound Open Wounding Event: Pressure Injury Status: Date Acquired: 05/02/2015 Comorbid Cataracts, Asthma, Hypertension, Type Weeks Of Treatment: 50 History: II Diabetes, Neuropathy, Received Clustered Wound: No Chemotherapy Photos Wound Measurements Length: (cm) 3.9 Width: (cm) 2.8 Depth: (cm) 0.2 Area: (cm) 8.577 Volume: (cm) 1.715 % Reduction in Area: -3798.6% % Reduction in Volume: -7695.5% Epithelialization: None Wound Description Classification: Category/Stage IV Wound Margin: Flat and Intact Wound Bed Granulation Amount: Large (67-100%) Exposed Structure Granulation Quality: Red, Pink Fascia Exposed: No Necrotic Amount: Small (1-33%) Fat Layer Exposed: No Necrotic Quality: Adherent Slough Tendon Exposed: No Muscle Exposed: No Joint Exposed: No Bone Exposed: Yes Dotson, Leandrea H. ([id number]) Periwound Skin Texture Texture Color No Abnormalities Noted: No No Abnormalities Noted: No Callus: No Atrophie Blanche: No Crepitus: No Cyanosis: No Excoriation: No Ecchymosis: No Fluctuance: No Erythema: Yes Friable: No Erythema Location: Circumferential Induration: No Hemosiderin Staining: No Localized Edema: No Mottled: No Rash: No Pallor: No Scarring: No Rubor: No Moisture Temperature / Pain No Abnormalities Noted: No Temperature: No Abnormality Dry / Scaly: No Tenderness on Palpation: Yes Maceration: No Moist: No Wound Preparation Ulcer Cleansing: Rinsed/Irrigated with Saline Topical Anesthetic Applied: Other:  lidocaine 4%, Treatment Notes Wound #3 (Midline Back) 1. Cleansed with: Clean wound with Normal Saline 2. Anesthetic Topical Lidocaine 4% cream to wound bed prior to debridement 4. Dressing Applied: Santyl Ointment 5. Secondary Dressing Applied Bordered Foam Dressing Electronic Signature(s) Signed: 04/16/2016 4:47:48 PM By: Montey Hora Entered  By: Montey Hora on 04/16/2016 16:29:41 Colpitts, Elianys H. ([id number]) -------------------------------------------------------------------------------- Wound Assessment Details Patient Name: Daughdrill, Keisha H. Date of Service: 04/16/2016 3:00 PM Medical Record Number: [id number] Patient Account Number: [id number] Date of Birth/Sex: 1945/12/03 (70 y.o. 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 Wound Open Wounding Event: Pressure Injury Status: Date Acquired: 08/01/2015 Comorbid Cataracts, Asthma, Hypertension, Type Weeks Of Treatment: 36 History: II Diabetes, Neuropathy, Received Clustered Wound: No Chemotherapy Photos Wound Measurements Length: (cm) 0.1 Width: (cm) 0.5 Depth: (cm) 0.3 Area: (cm) 0.039 Volume: (cm) 0.012 % Reduction in Area: 98.8% % Reduction in Volume: 96.2% Epithelialization: None Tunneling: No Undermining: No Wound Description Classification: Category/Stage III Wound Margin: Flat and Intact Exudate Amount: Medium Exudate Type: Serous Exudate Color: amber Foul Odor After Cleansing: No Wound Bed Granulation Amount: Large (67-100%) Exposed Structure Granulation Quality: Pink Fascia Exposed: No Necrotic Amount: None Present (0%) Fat Layer Exposed: No Tendon Exposed: No Muscle Exposed: No Palmer, Katana H. ([id number]) Joint Exposed: No Bone Exposed: No Limited to Skin Breakdown Periwound Skin Texture Texture Color No Abnormalities Noted: No No Abnormalities Noted: No Callus: No Atrophie Blanche: No Crepitus: No Cyanosis: No Excoriation: No Ecchymosis: No Fluctuance: No Erythema: No Friable: No Hemosiderin Staining: No Induration: No Mottled: No Localized Edema: No Pallor:  No Rash: No Rubor: No Scarring: No Temperature / Pain Moisture Temperature: No Abnormality No Abnormalities Noted: No Tenderness on Palpation: Yes Dry / Scaly: No Maceration: No Moist: No Wound Preparation Ulcer Cleansing: Rinsed/Irrigated with Saline Topical Anesthetic Applied: Other: lidocaine 4%, Treatment Notes Wound #7 (Right Ischial Tuberosity) 1. Cleansed with: Clean wound with Normal Saline 2. Anesthetic Topical Lidocaine 4% cream to wound bed prior to debridement 4. Dressing Applied: Prisma Ag 5. Secondary Dressing Applied Bordered Foam Dressing Electronic Signature(s) Signed: 04/16/2016 4:47:48 PM By: Montey Hora Entered By: Montey Hora on 04/16/2016 16:30:25 Ware, Preeya H. ([id number]) -------------------------------------------------------------------------------- Vitals Details Patient Name: [name], Malia H. Date of Service: 04/16/2016 3:00 PM Medical Record Number: [id number] Patient Account Number: [id number] Date of Birth/Sex: 27-Apr-1946 (70 y.o. Female) Treating RN: Montey Hora Primary Care Physician: Paulita Cradle Other Clinician: Referring Physician: Paulita Cradle Treating Physician/Extender: Frann Rider in Treatment: 4 Vital Signs Time Taken: 15:13 Temperature (F): 98.4 Height (in): 65 Pulse (bpm): 115 Weight (lbs): 100 Respiratory Rate (breaths/min): 18 Body Mass Index (BMI): 16.6 Blood Pressure (mmHg): 134/59 Reference Range: 80 - 120 mg / dl Electronic Signature(s) Signed: 04/16/2016 4:47:48 PM By: Montey Hora Entered By: Montey Hora on 04/16/2016 15:14:21

## 2016-04-17 NOTE — Progress Notes (Addendum)
[name], [name] ([id number]) Visit Report for 04/16/2016 Chief Complaint Document Details Patient Name: [name], [name] Date of Service: 04/16/2016 3:00 PM Medical Record Number: [id number] Patient Account Number: [id number] Date of Birth/Sex: 02-05-1946 (70 y.o. Female) Treating RN: [name] Primary Care Physician: [name] Other Clinician: Referring Physician: Paulita [name] Treating Physician/Extender: [name] in Treatment: 70 Information Obtained from: Patient Chief Complaint Patient presents to the wound care center for a consult due non healing wound. Ulcers on the right elbow and the right heel for about 1 month. Electronic Signature(s) Signed: 04/16/2016 3:34:37 PM By: Mckenzie Fudge MD, FACS Entered By: [name] on 04/16/2016 15:34:37 [name], [name] ([id number]) -------------------------------------------------------------------------------- HPI Details Patient Name: [name], [name] Date of Service: 04/16/2016 3:00 PM Medical Record Number: [id number] Patient Account Number: [id number] Date of Birth/Sex: 19-Jul-1946 (70 y.o. Female) Treating RN: [name] Primary Care Physician: [name] Other Clinician: Referring Physician: Paulita [name] Treating Physician/Extender: [name] in Treatment: 46 History of Present Illness Location: Ulceration on the right heel and the right elbow. Quality: Patient reports experiencing a dull pain to affected area(s). Severity: Patient states wound (s) are getting better. Duration: Patient has had the wound for < 4 weeks prior to presenting for treatment Timing: Pain in wound is Intermittent (comes and goes Context: The wound appeared gradually over time Modifying Factors: Consults to this date include:Augmentin and Bactrim and also some heel protection with duoderm Associated Signs and Symptoms: Patient reports having difficulty standing for long periods. HPI Description:  70 year old female with history of peripheral neuropathy, history of diet controlled diabetes mellitus type 2, history of alcoholism here for wound consult sent by her PCP [name] She has pressure ulcers at her right elbow, bilateral heels. Plain films of right calcaneus without acute bony process. Patient started by PCP on Augmentin, Bactrim as per orders, DuoDerm dressings applied - reports some improvement in her ulcer since last seen. Denies fever, chills, nausea, vomiting, diarrhea. She had a right humerus fracture in the middle of May and has had no surgery and arm is in a sling. She is also been laying in the bed for quite a while. Past medical history significant for essential hypertension, osteoporosis, peripheral neuropathy, alcoholism, ataxia, personal history of breast cancer treated with surgery chemotherapy and radiation and this was done in December 2010. she is also status post laparoscopic cholecystectomy, pilonidal cyst excision, subcutaneous port placement, partial mastectomy on the left side, skin cancer removal. 03/21/2015 -- she says overall she's been doing better and continues to smoke about 15 cigarettes a day. 03/21/2015 - her orthopedic doctor has said she may require surgery for her right humerus fracture. 04/04/2015 -- her orthopedic surgery has been scheduled for August 11. 04/18/2015 -- she is doing fine as far as her elbow and her right heel goes but she has developed some redness over prominence on her thoracic spine and wanted me to take a look at this. 05/02/2015 -- she had her surgery done and now is in a sling and support. Her back has developed a pressure injury of undetermined stage. She seems to be in better spirits. 05/16/2015 -- last week her right heel was looking great and we had healed it out, but she has not been offloading appropriately and has a deep tissue injury on the right heel again. The area on her right elbow has opened out with slough and  the area in the thoracic spine is also getting worse. 05/30/2015 -- she has developed  2 new ulcerations one on her left ischial tuberosity and one on the sacral region. She is still working on getting up smoking but is also unable to take her vitamins as she says she develops a diarrhea when she takes vitamins. She has increased her intake of proteins. 06/06/2015 -- the patient's husband manages to get her a low air loss mattress with initiating pressure but [name], [name] ([id number]) did not know how to use it exactly and the patient was not happy about using it. Overall she says she's been feeling better. 07/11/2015 -- . Discussed a surgical opinion for debridement and application of a wound VAC, 2 weeks ago but the patient has been reluctant to get a surgical opinion as she wants to avoid surgery. 07/18/2015 -- they have an appointment to see Dr. Tamala Julian this coming Wednesday. 07/29/2015 -- they saw Dr. Tamala Julian in his office and he did a debridement of the wound and removed significant amount of slough. This is in addition to the debridement I had done previously on Friday where a large amount of the eschar was removed. He did not recommend the application of wound VAC. Addendum : [name] note was reviewed via EPIC and details noted as above. 08/05/2015 -- over the last week she has developed a another pressure injury to her right hip and has had significant discoloration of the skin and an eschar there. 08/15/2015 -- she is still smoking about a pack of cigarettes a day and does not seem to want to quit. They have not been able to talk to the vendor regarding the air mattress and I will ask them to get in touch again. She is reluctant to take vitamins and does admit her nutrition is poor. 08/22/2015 -- she has been unable to tolerate her vitamins and continues to smoke. They're working on getting a low air loss mattress and have been speaking with the vendor. 09/19/2015 -- since her last  visit and was admitted to the hospital between 09/09/2015 and 09/16/2015. She was thought to have an active sepsis possibly from one of her decubitus ulcers but nothing was grown except for an MSSA from her thoracic spine region. CT of this area did not show any osteomyelitis. Been given IV antibiotics which included vancomycin and Zosyn in the hospital under the care of Dr. [name] the ID specialist she was sent home on oral Keflex 500 mg 3 times a day for 2 weeks. he will consider an MRI in the outpatient setting to completely rule out osteomyelitis of the spine. 10/03/2015 --readmitted to hospital on 09/23/2015 for general debility, lethargy and possible sepsis and workup was in progress. Seen by Dr. [name] and he would also like a workup for underlying malignancy as sheos had previous ultrasounds of the breasts suggesting findings of concern. Workup done so far does not suggest deep bony infection. She was treated for a pneumonia and received Zosyn and vancomycin. She had been recommended Cipro 500 twice a day and doxycycline 100 mg twice a day once she was to be [place] home. The antibiotics were to be stopped on 10/07/2015. 10/17/2015 -- patient is feeling much better and has been eating better and doing her offloading as much as possible. 10/23/2015 -- she has an appointment to see Dr. [name] tomorrow but other than that has been doing as much as possible with offloading and increasing her diet. 11/07/2015 -- she has not been here to see [place] for 2 weeks and at the present time continues to  try and eat better, work on off loading and is using the air mattress at night. She saw her PCP yesterday and she has put her back on a fentanyl patch. 11/21/2015 -- for some strange reason she has been sitting in a chair with her legs dependent for the last 6 days nonstop and has developed massive lower extremity edema and pedal edema with weeping and ulceration. 02/13/2016 -- it's been 3 weeks  since I saw her last and overall she's done well except for a right heel where she has had some new ulcerations. 04/16/2016 -- last week I had noted that there was bone protruding in the decubitus ulcer of the thoracic spine area and due to the fact that the patient is extremely labile in her emotional status I had called the patient's husband who came to see me and had a discussion regarding her care. He has primed her during the week and today she is going to be okay to discuss her diagnosis and treatment options. [name], [name] ([id number]) Electronic Signature(s) Signed: 04/16/2016 3:40:47 PM By: Mckenzie Fudge MD, FACS Entered By: [name] on 04/16/2016 15:40:46 [name], [name] ([id number]) -------------------------------------------------------------------------------- Physical Exam Details Patient Name: Nosbisch, Fayth H. Date of Service: 04/16/2016 3:00 PM Medical Record Number: [id number] Patient Account Number: [id number] Date of Birth/Sex: [birth year] (70 y.o. Female) Treating RN: [name] Primary Care Physician: [name] Other Clinician: Referring Physician: Paulita [name] Treating Physician/Extender: [name] in Treatment: 73 Constitutional . Pulse regular. Respirations normal and unlabored. Afebrile. . Eyes Nonicteric. Reactive to light. Ears, Nose, Mouth, and Throat Lips, teeth, and gums WNL.Marland Kitchen Moist mucosa without lesions. Neck supple and nontender. No palpable supraclavicular or cervical adenopathy. Normal sized without goiter. Respiratory WNL. No retractions.. Breath sounds WNL, No rubs, rales, rhonchi, or wheeze.. Cardiovascular Heart rhythm and rate regular, no murmur or gallop.. Pedal Pulses WNL. No clubbing, cyanosis or edema. Chest Breasts symmetical and no nipple discharge.. Breast tissue WNL, no masses, lumps, or tenderness.. Lymphatic No adneopathy. No adenopathy. No adenopathy. Musculoskeletal Adexa without tenderness or  enlargement.. Digits and nails w/o clubbing, cyanosis, infection, petechiae, ischemia, or inflammatory conditions.. Integumentary (Hair, Skin) No suspicious lesions. No crepitus or fluctuance. No peri-wound warmth or erythema. No masses.Marland Kitchen Psychiatric Judgement and insight Intact.. No evidence of depression, anxiety, or agitation.. Notes the right hip, both heels continued to do well and we will use Prisma AG on these wounds. The thoracic spine wound continues to have necrotic debris on the right lateral edge and there is palpable bone in the midline which continues to be covered with some granulation tissue. Electronic Signature(s) Signed: 04/16/2016 4:09:41 PM By: Mckenzie Fudge MD, FACS Entered By: [name] on 04/16/2016 16:09:40 [name], [name] ([id number]) -------------------------------------------------------------------------------- Physician Orders Details Patient Name: [name], Sharea H. Date of Service: 04/16/2016 3:00 PM Medical Record Number: [id number] Patient Account Number: [id number] Date of Birth/Sex: 1946-02-14 (70 y.o. 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 Wound #15 Right,Medial Calcaneus o Clean wound  with Normal Saline. Wound #3 Midline Back o Clean wound with Normal Saline. Wound #7 Right Ischial  Tuberosity o Clean wound with Normal Saline. Anesthetic Wound #11 Right,Distal Calcaneus o Topical Lidocaine 4% cream applied to wound bed prior to debridement - applied in clinic only Wound #15 Right,Medial Calcaneus o Topical Lidocaine 4% cream applied to wound bed prior to debridement - applied in clinic only Wound #3 Midline Back o Topical Lidocaine 4% cream applied to wound bed prior to debridement - applied in clinic only Wound #7 Right Ischial Tuberosity o Topical Lidocaine 4% cream applied to wound bed prior to debridement - applied in clinic only Primary Wound Dressing Wound #11 Right,Distal Calcaneus [place], Sonnia H. ([id number]) o Prisma Ag Wound #15 Right,Medial Calcaneus o Prisma Ag Wound #3 Midline Back o Santyl Ointment Wound #7 Right Ischial Tuberosity o Prisma Ag Secondary Dressing Wound #11 Right,Distal Calcaneus o Boardered Foam Dressing Wound #15 Right,Medial Calcaneus o Boardered Foam Dressing Wound #3 Midline Back o Boardered Foam Dressing Wound #7 Right Ischial Tuberosity o Boardered Foam Dressing Dressing Change Frequency Wound #11 Right,Distal Calcaneus o Change dressing every other day. Wound #15 Right,Medial Calcaneus o Change dressing every other day. Wound #3 Midline Back o Change dressing every day. Wound #7 Right Ischial Tuberosity o Change dressing every day. Follow-up Appointments Wound #11 Right,Distal Calcaneus o Return Appointment in 1 week. Wound #15 Right,Medial Calcaneus o Return Appointment in 1 week. Wound #3 Midline Back o Return Appointment in 1 week. Wound #7 Right Ischial Tuberosity Welz, Chistina H. ([id number]) o Return Appointment in 1 week. Off-Loading Wound #11 Right,Distal Calcaneus o Turn and reposition every 2 hours - Keep pressure off of back and heels Wound #15 Right,Medial Calcaneus o Turn and reposition every 2 hours - Keep pressure off of back and heels Wound #3  Midline Back o Turn and reposition every 2 hours - Keep pressure off of back and heels Wound #7 Right Ischial Tuberosity o Turn and reposition every 2 hours - Keep pressure off of back and heels Additional Orders / Instructions Wound #11 Right,Distal Calcaneus o Stop Smoking o Increase protein intake. Wound #15 Right,Medial Calcaneus o Stop Smoking o Increase protein intake. Wound #3 Midline Back o Stop Smoking o Increase protein intake. Wound #7 Right Ischial Tuberosity o Stop Smoking o Increase protein intake. Home Health Wound #11 [place] Visits - [place] Nurse may visit PRN to address patientos wound care needs. o FACE TO FACE ENCOUNTER: MEDICARE and MEDICAID PATIENTS: I certify that this patient is under my care and that I had a face-to-face encounter that meets the physician face-to-face encounter requirements with this patient on this date. The encounter with the patient was in whole or in part for the following MEDICAL CONDITION: (primary reason for [name]) MEDICAL NECESSITY: I certify, that based on my findings, NURSING services are a medically necessary home health service. HOME BOUND STATUS: I certify that my clinical findings support that this patient is homebound (i.e., Due to illness or injury, pt requires aid of supportive devices such as crutches, cane, wheelchairs, walkers, the use of special transportation or the assistance of another person to leave their place of residence. There is a normal inability to leave the home and doing so requires considerable and taxing effort. Other absences are for medical reasons / religious services and are infrequent or of short duration when for other reasons). [name], [name] ([id number]) o If current dressing causes regression in  wound condition, may D/C ordered dressing product/s and apply Normal Saline Moist Dressing daily until next  [place] / Other MD appointment. [place] of regression in wound condition at [phone]. o Please direct any NON-WOUND related issues/requests for orders to patient's Primary Care Physician Wound #15 [place] Visits - [place] Nurse may visit PRN to address patientos wound care needs. o FACE TO FACE ENCOUNTER: MEDICARE and MEDICAID PATIENTS: I certify that this patient is under my care and that I had a face-to-face encounter that meets the physician face-to-face encounter requirements with this patient on this date. The encounter with the patient was in whole or in part for the following MEDICAL CONDITION: (primary reason for [place]) MEDICAL NECESSITY: I certify, that based on my findings, NURSING services are a medically necessary home health service. HOME BOUND STATUS: I certify that my clinical findings support that this patient is homebound (i.e., Due to illness or injury, pt requires aid of supportive devices such as crutches, cane, wheelchairs, walkers, the use of special transportation or the assistance of another person to leave their place of residence. There is a normal inability to leave the home and doing so requires considerable and taxing effort. Other absences are for medical reasons / religious services and are infrequent or of short duration when for other reasons). o If current dressing causes regression in wound condition, may D/C ordered dressing product/s and apply Normal Saline Moist Dressing daily until next [place] / Other MD appointment. [place] of regression in wound condition at [phone]. o Please direct any NON-WOUND related issues/requests for orders to patient's Primary Care Physician Wound #3 Midline Back o [place] Visits - [place] Nurse may visit PRN to address patientos wound care  needs. o FACE TO FACE ENCOUNTER: MEDICARE and MEDICAID PATIENTS: I certify that this patient is under my care and that I had a face-to-face encounter that meets the physician face-to-face encounter requirements with this patient on this date. The encounter with the patient was in whole or in part for the following MEDICAL CONDITION: (primary reason for [place]) MEDICAL NECESSITY: I certify, that based on my findings, NURSING services are a medically necessary home health service. HOME BOUND STATUS: I certify that my clinical findings support that this patient is homebound (i.e., Due to illness or injury, pt requires aid of supportive devices such as crutches, cane, wheelchairs, walkers, the use of special transportation or the assistance of another person to leave their place of residence. There is a normal inability to leave the home and doing so requires considerable and taxing effort. Other absences are for medical reasons / religious services and are infrequent or of short duration when for other reasons). o If current dressing causes regression in wound condition, may D/C ordered dressing product/s and apply Normal Saline Moist Dressing daily until next [place] / Other MD appointment. [place] of regression in wound condition at [phone]. o Please direct any NON-WOUND related issues/requests for orders to patient's Primary Care Physician Wound #7 Right Ischial [place] Visits - Dilyn Mcdavitt, Aviendha H. ([id number]) o [place] Nurse may visit PRN to address patientos wound care needs. o FACE TO FACE ENCOUNTER: MEDICARE and MEDICAID PATIENTS: I certify that this patient is under my care and that I had a face-to-face encounter that meets the physician face-to-face encounter requirements with this patient  on this date. The encounter with the patient was in whole or in part for the following MEDICAL CONDITION:  (primary reason for [place]) MEDICAL NECESSITY: I certify, that based on my findings, NURSING services are a medically necessary home health service. HOME BOUND STATUS: I certify that my clinical findings support that this patient is homebound (i.e., Due to illness or injury, pt requires aid of supportive devices such as crutches, cane, wheelchairs, walkers, the use of special transportation or the assistance of another person to leave their place of residence. There is a normal inability to leave the home and doing so requires considerable and taxing effort. Other absences are for medical reasons / religious services and are infrequent or of short duration when for other reasons). o If current dressing causes regression in wound condition, may D/C ordered dressing product/s and apply Normal Saline Moist Dressing daily until next [place] / Other MD appointment. [place] of regression in wound condition at [phone]. o Please direct any NON-WOUND related issues/requests for orders to patient's Primary Care Physician Electronic Signature(s) Signed: 04/16/2016 4:14:16 PM By: Mckenzie Fudge MD, FACS Signed: 04/16/2016 4:47:48 PM By: [name] Entered By: [name] on 04/16/2016 16:04:03 [name], [name] ([id number]) -------------------------------------------------------------------------------- Problem List Details Patient Name: Ha, Krystianna H. Date of Service: 04/16/2016 3:00 PM Medical Record Number: [id number] Patient Account Number: [id number] Date of Birth/Sex: 01/23/1946 (70 y.o. 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 [phone]) I89.0 Lymphedema, not elsewhere classified 11/21/2015 11/21/2015 L97.211 Non-pressure chronic ulcer of right calf limited to 11/21/2015 11/21/2015 breakdown of skin Electronic Signature(s) Signed: 04/16/2016 3:34:30 PM By: Mckenzie Fudge MD, FACS Entered By: [name] on 04/16/2016 15:34:30 Coomes, Kalesha H. ([id number]) -------------------------------------------------------------------------------- Progress Note Details Patient Name: [name], [name] Date of Service: 04/16/2016 3:00 PM Medical Record Number: [id number] Patient Account Number: [id number] Date of Birth/Sex: 03-Jul-1946 (70 y.o. Female) Treating RN: [name] Primary Care Physician: [name] Other Clinician: Referring Physician: Paulita [name] Treating Physician/Extender: [name] in Treatment: 61 Subjective Chief Complaint Information obtained from Patient Patient presents to the wound care center for a consult due non healing wound. Ulcers on the right elbow and the right heel for about 1 month. History of Present Illness (HPI) The following HPI elements were documented for the patient's wound: Location: Ulceration on the right heel and the right elbow. Quality: Patient reports experiencing a dull pain to affected area(s). Severity: Patient states  wound (s) are getting better. Duration: Patient has had the wound for < 4 weeks prior to presenting for treatment Timing: Pain in wound is Intermittent (comes and goes Context: The wound appeared gradually over time Modifying Factors: Consults to this date include:Augmentin and Bactrim and also  some heel protection with duoderm Associated Signs and Symptoms: Patient reports having difficulty standing for long periods. 70 year old female with history of peripheral neuropathy, history of diet controlled diabetes mellitus type 2, history of alcoholism here for wound consult sent by her PCP [name] She has pressure ulcers at her right elbow, bilateral heels. Plain films of right calcaneus without acute bony process. Patient started by PCP on Augmentin, Bactrim as per orders, DuoDerm dressings applied - reports some improvement in her ulcer since last seen. Denies fever, chills, nausea, vomiting, diarrhea. She had a right humerus fracture in the middle of May and has had no surgery and arm is in a sling. She is also been laying in the bed for quite a while. Past medical history significant for essential hypertension, osteoporosis, peripheral neuropathy, alcoholism, ataxia, personal history of breast cancer treated with surgery chemotherapy and radiation and this was done in December 2010. she is also status post laparoscopic cholecystectomy, pilonidal cyst excision, subcutaneous port placement, partial mastectomy on the left side, skin cancer removal. 03/21/2015 -- she says overall she's been doing better and continues to smoke about 15 cigarettes a day. 03/21/2015 - her orthopedic doctor has said she may require surgery for her right humerus fracture. 04/04/2015 -- her orthopedic surgery has been scheduled for August 11. 04/18/2015 -- she is doing fine as far as her elbow and her right heel goes but she has developed some redness over prominence on her thoracic spine and wanted me to take a look  at this. Pesce, Pamelia H. ([id number]) 05/02/2015 -- she had her surgery done and now is in a sling and support. Her back has developed a pressure injury of undetermined stage. She seems to be in better spirits. 05/16/2015 -- last week her right heel was looking great and we had healed it out, but she has not been offloading appropriately and has a deep tissue injury on the right heel again. The area on her right elbow has opened out with slough and the area in the thoracic spine is also getting worse. 05/30/2015 -- she has developed 2 new ulcerations one on her left ischial tuberosity and one on the sacral region. She is still working on getting up smoking but is also unable to take her vitamins as she says she develops a diarrhea when she takes vitamins. She has increased her intake of proteins. 06/06/2015 -- the patient's husband manages to get her a low air loss mattress with initiating pressure but did not know how to use it exactly and the patient was not happy about using it. Overall she says she's been feeling better. 07/11/2015 -- . Discussed a surgical opinion for debridement and application of a wound VAC, 2 weeks ago but the patient has been reluctant to get a surgical opinion as she wants to avoid surgery. 07/18/2015 -- they have an appointment to see Dr. Tamala Julian this coming Wednesday. 07/29/2015 -- they saw Dr. Tamala Julian in his office and he did a debridement of the wound and removed significant amount of slough. This is in addition to the debridement I had done previously on Friday where a large amount of the eschar was removed. He did not recommend the application of wound VAC. Addendum : [name] note was reviewed via EPIC and details noted as above. 08/05/2015 -- over the last week she has developed a another pressure injury to her right hip and has had significant discoloration of the skin and an eschar there. 08/15/2015 -- she is still  smoking about a pack of cigarettes a day and  does not seem to want to quit. They have not been able to talk to the vendor regarding the air mattress and I will ask them to get in touch again. She is reluctant to take vitamins and does admit her nutrition is poor. 08/22/2015 -- she has been unable to tolerate her vitamins and continues to smoke. They're working on getting a low air loss mattress and have been speaking with the vendor. 09/19/2015 -- since her last visit and was admitted to the hospital between 09/09/2015 and 09/16/2015. She was thought to have an active sepsis possibly from one of her decubitus ulcers but nothing was grown except for an MSSA from her thoracic spine region. CT of this area did not show any osteomyelitis. Been given IV antibiotics which included vancomycin and Zosyn in the hospital under the care of Dr. [name] the ID specialist she was sent home on oral Keflex 500 mg 3 times a day for 2 weeks. he will consider an MRI in the outpatient setting to completely rule out osteomyelitis of the spine. 10/03/2015 --readmitted to hospital on 09/23/2015 for general debility, lethargy and possible sepsis and workup was in progress. Seen by Dr. [name] and he would also like a workup for underlying malignancy as she s had previous ultrasounds of the breasts suggesting findings of concern. Workup done so far does not suggest deep bony infection. She was treated for a pneumonia and received Zosyn and vancomycin. She had been recommended Cipro 500 twice a day and doxycycline 100 mg twice a day once she was to be [place] home. The antibiotics were to be stopped on 10/07/2015. 10/17/2015 -- patient is feeling much better and has been eating better and doing her offloading as much as possible. 10/23/2015 -- she has an appointment to see Dr. [name] tomorrow but other than that has been doing as much as possible with offloading and increasing her diet. 11/07/2015 -- she has not been here to see [place] for 2 weeks and at the  present time continues to try and eat better, work on off loading and is using the air mattress at night. She saw her PCP yesterday and she has put her back on a fentanyl patch. 11/21/2015 -- for some strange reason she has been sitting in a chair with her legs dependent for the last 6 days nonstop and has developed massive lower extremity edema and pedal edema with weeping and ulceration. [name], [name] ([id number]) 02/13/2016 -- it's been 3 weeks since I saw her last and overall she's done well except for a right heel where she has had some new ulcerations. 04/16/2016 -- last week I had noted that there was bone protruding in the decubitus ulcer of the thoracic spine area and due to the fact that the patient is extremely labile in her emotional status I had called the patient's husband who came to see me and had a discussion regarding her care. He has primed her during the week and today she is going to be okay to discuss her diagnosis and treatment options. Objective Constitutional Pulse regular. Respirations normal and unlabored. Afebrile. Vitals Time Taken: 3:13 PM, Height: 65 in, Weight: 100 lbs, BMI: 16.6, Temperature: 98.4 F, Pulse: 115 bpm, Respiratory Rate: 18 breaths/min, Blood Pressure: 134/59 mmHg. Eyes Nonicteric. Reactive to light. Ears, Nose, Mouth, and Throat Lips, teeth, and gums WNL.Marland Kitchen Moist mucosa without lesions. Neck supple and nontender. No palpable supraclavicular or cervical adenopathy. Normal sized  without goiter. Respiratory WNL. No retractions.. Breath sounds WNL, No rubs, rales, rhonchi, or wheeze.. Cardiovascular Heart rhythm and rate regular, no murmur or gallop.. Pedal Pulses WNL. No clubbing, cyanosis or edema. Chest Breasts symmetical and no nipple discharge.. Breast tissue WNL, no masses, lumps, or tenderness.. Lymphatic No adneopathy. No adenopathy. No adenopathy. Musculoskeletal Adexa without tenderness or enlargement.. Digits and nails w/o  clubbing, cyanosis, infection, petechiae, ischemia, or inflammatory conditions.Marland Kitchen Psychiatric Judgement and insight Intact.. No evidence of depression, anxiety, or agitation.Marland Kitchen [name], [name] ([id number]) General Notes: the right hip, both heels continued to do well and we will use Prisma AG on these wounds. The thoracic spine wound continues to have necrotic debris on the right lateral edge and there is palpable bone in the midline which continues to be covered with some granulation tissue. Integumentary (Hair, Skin) No suspicious lesions. No crepitus or fluctuance. No peri-wound warmth or erythema. No masses.. Wound #11 status is Open. Original cause of wound was Pressure Injury. The wound is located on the Right,Distal Calcaneus. The wound measures 0.9cm length x 0.8cm width x 0.1cm depth; 0.565cm^2 area and 0.057cm^3 volume. The wound is limited to skin breakdown. There is no tunneling or undermining noted. There is a small amount of serous drainage noted. The wound margin is flat and intact. There is large (67-100%) granulation within the wound bed. There is a small (1-33%) amount of necrotic tissue within the wound bed including Adherent Slough. The periwound skin appearance exhibited: Localized Edema, Dry/Scaly. The periwound skin appearance did not exhibit: Callus, Crepitus, Excoriation, Fluctuance, Friable, Induration, Rash, Scarring, Maceration, Moist, Atrophie Blanche, Cyanosis, Ecchymosis, Hemosiderin Staining, Mottled, Pallor, Rubor, Erythema. Periwound temperature was noted as No Abnormality. The periwound has tenderness on palpation. Wound #15 status is Open. Original cause of wound was Pressure Injury. The wound is located on the Right,Medial Calcaneus. The wound measures 0.6cm length x 0.7cm width x 0.1cm depth; 0.33cm^2 area and 0.033cm^3 volume. The wound is limited to skin breakdown. There is no tunneling or undermining noted. There is a small amount of serous drainage noted.  The wound margin is flat and intact. There is large (67-100%) red granulation within the wound bed. There is no necrotic tissue within the wound bed. The periwound skin appearance did not exhibit: Callus, Crepitus, Excoriation, Fluctuance, Friable, Induration, Localized Edema, Rash, Scarring, Dry/Scaly, Maceration, Moist, Atrophie Blanche, Cyanosis, Ecchymosis, Hemosiderin Staining, Mottled, Pallor, Rubor, Erythema. Periwound temperature was noted as No Abnormality. The periwound has tenderness on palpation. Wound #3 status is Open. Original cause of wound was Pressure Injury. The wound is located on the Midline Back. The wound measures 3.9cm length x 2.8cm width x 0.2cm depth; 8.577cm^2 area and 1.715cm^3 volume. There is bone exposed. The wound margin is flat and intact. There is large (67-100%) red, pink granulation within the wound bed. There is a small (1-33%) amount of necrotic tissue within the wound bed including Adherent Slough. The periwound skin appearance exhibited: Erythema. The periwound skin appearance did not exhibit: Callus, Crepitus, Excoriation, Fluctuance, Friable, Induration, Localized Edema, Rash, Scarring, Dry/Scaly, Maceration, Moist, Atrophie Blanche, Cyanosis, Ecchymosis, Hemosiderin Staining, Mottled, Pallor, Rubor. The surrounding wound skin color is noted with erythema which is circumferential. Periwound temperature was noted as No Abnormality. The periwound has tenderness on palpation. Wound #7 status is Open. Original cause of wound was Pressure Injury. The wound is located on the Right Ischial Tuberosity. The wound measures 0.1cm length x 0.5cm width x 0.3cm depth; 0.039cm^2 area and 0.012cm^3 volume. The wound is  limited to skin breakdown. There is no tunneling or undermining noted. There is a medium amount of serous drainage noted. The wound margin is flat and intact. There is large (67-100%) pink granulation within the wound bed. There is no necrotic tissue  within the wound bed. The periwound skin appearance did not exhibit: Callus, Crepitus, Excoriation, Fluctuance, Friable, Induration, Localized Edema, Rash, Scarring, Dry/Scaly, Maceration, Moist, Atrophie Blanche, Cyanosis, Ecchymosis, Hemosiderin Staining, Mottled, Pallor, Rubor, Erythema. Periwound temperature was noted as No Abnormality. The periwound has tenderness on palpation. [name], [name] 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 The possibility of osteomyelitis of the spine is concerned and this would be the best way to diagnose it. She is agreeable but does not want to have it right away. Continue local care with Santyl ointment to the thoracic spine wound and Prisma AG to the right hip and both heels. She will go back to see as next week Plan Wound Cleansing: Wound #11 Right,Distal Calcaneus: Clean wound with Normal Saline. Wound #15 Right,Medial Calcaneus: Clean wound with Normal Saline. Wound #3 Midline Back: Clean wound with Normal Saline. Wound #7 Right Ischial Tuberosity: Clean wound with Normal Saline. Anesthetic: Wound #11 Right,Distal Calcaneus: Topical Lidocaine 4% cream applied to wound bed prior to debridement - applied in clinic only Wound #15 Right,Medial Calcaneus: Topical Lidocaine 4% cream applied to wound bed prior to debridement - applied in clinic only Wound #3 Midline Back: Topical Lidocaine 4% cream applied to wound bed prior to debridement - applied in clinic only Wound #7 Right Ischial Tuberosity: [name], [name] ([id number]) Topical  Lidocaine 4% cream applied to wound bed prior to debridement - applied in clinic only Primary Wound Dressing: Wound #11 Right,Distal Calcaneus: Prisma Ag Wound #15 Right,Medial Calcaneus: Prisma Ag Wound #3 Midline Back: Santyl Ointment Wound #7 Right Ischial Tuberosity: Prisma Ag Secondary Dressing: Wound #11 Right,Distal Calcaneus: Boardered Foam Dressing Wound #15 Right,Medial Calcaneus: Boardered Foam Dressing Wound #3 Midline Back: Boardered Foam Dressing Wound #7 Right Ischial Tuberosity: Boardered Foam Dressing Dressing Change Frequency: Wound #11 Right,Distal Calcaneus: Change dressing every other day. Wound #15 Right,Medial Calcaneus: Change dressing every other day. Wound #3 Midline Back: Change dressing every day. Wound #7 Right Ischial Tuberosity: Change dressing every day. Follow-up Appointments: Wound #11 Right,Distal Calcaneus: Return Appointment in 1 week. Wound #15 Right,Medial Calcaneus: Return Appointment in 1 week. Wound #3 Midline Back: Return Appointment in 1 week. Wound #7 Right Ischial Tuberosity: Return Appointment in 1 week. Off-Loading: Wound #11 Right,Distal Calcaneus: Turn and reposition every 2 hours - Keep pressure off of back and heels Wound #15 Right,Medial Calcaneus: Turn and reposition every 2 hours - Keep pressure off of back and heels Wound #3 Midline Back: Turn and reposition every 2 hours - Keep pressure off of back and heels Wound #7 Right Ischial Tuberosity: Turn and reposition every 2 hours - Keep pressure off of back and heels Additional Orders / Instructions: Wound #11 Right,Distal Calcaneus: Stop Smoking Increase protein intake. Wound #15 Right,Medial Calcaneus: [name], [name] ([id number]) Stop Smoking Increase protein intake. Wound #3 Midline Back: Stop Smoking Increase protein intake. Wound #  7 Right Ischial Tuberosity: Stop Smoking Increase protein intake. Home Health: Wound #11 Right,Distal  Calcaneus: Continue Home Health Visits - [facility] Nurse may visit PRN to address patient s wound care needs. FACE TO FACE ENCOUNTER: MEDICARE and MEDICAID PATIENTS: I certify that this patient is under my care and that I had a face-to-face encounter that meets the physician face-to-face encounter requirements with this patient on this date. The encounter with the patient was in whole or in part for the following MEDICAL CONDITION: (primary reason for [place]) MEDICAL NECESSITY: I certify, that based on my findings, NURSING services are a medically necessary home health service. HOME BOUND STATUS: I certify that my clinical findings support that this patient is homebound (i.e., Due to illness or injury, pt requires aid of supportive devices such as crutches, cane, wheelchairs, walkers, the use of special transportation or the assistance of another person to leave their place of residence. There is a normal inability to leave the home and doing so requires considerable and taxing effort. Other absences are for medical reasons / religious services and are infrequent or of short duration when for other reasons). If current dressing causes regression in wound condition, may D/C ordered dressing product/s and apply Normal Saline Moist Dressing daily until next [place] / Other MD appointment. [place] of regression in wound condition at [phone]. Please direct any NON-WOUND related issues/requests for orders to patient's Primary Care Physician Wound #15 Right,Medial Calcaneus: [place] Visits - [facility] Nurse may visit PRN to address patient s wound care needs. FACE TO FACE ENCOUNTER: MEDICARE and MEDICAID PATIENTS: I certify that this patient is under my care and that I had a face-to-face encounter that meets the physician face-to-face encounter requirements with this patient on this date. The encounter with the patient was  in whole or in part for the following MEDICAL CONDITION: (primary reason for [place]) MEDICAL NECESSITY: I certify, that based on my findings, NURSING services are a medically necessary home health service. HOME BOUND STATUS: I certify that my clinical findings support that this patient is homebound (i.e., Due to illness or injury, pt requires aid of supportive devices such as crutches, cane, wheelchairs, walkers, the use of special transportation or the assistance of another person to leave their place of residence. There is a normal inability to leave the home and doing so requires considerable and taxing effort. Other absences are for medical reasons / religious services and are infrequent or of short duration when for other reasons). If current dressing causes regression in wound condition, may D/C ordered dressing product/s and apply Normal Saline Moist Dressing daily until next [place] / Other MD appointment. [place] of regression in wound condition at [phone]. Please direct any NON-WOUND related issues/requests for orders to patient's Primary Care Physician Wound #3 Midline Back: [place] Visits - [facility] Nurse may visit PRN to address patient s wound care needs. FACE TO FACE ENCOUNTER: MEDICARE and MEDICAID PATIENTS: I certify that this patient is under my care and that I had a face-to-face encounter that meets the physician face-to-face encounter requirements with this patient on this date. The encounter with the patient was in whole or in part for the following MEDICAL CONDITION: (primary reason for [place]) MEDICAL NECESSITY: I certify, that based on my findings, NURSING services are a medically necessary home health service. HOME Damron, Rosealyn H. ([id number]) BOUND STATUS: I certify that  my clinical findings support that this patient is homebound (i.e., Due to illness or injury, pt requires aid of supportive  devices such as crutches, cane, wheelchairs, walkers, the use of special transportation or the assistance of another person to leave their place of residence. There is a normal inability to leave the home and doing so requires considerable and taxing effort. Other absences are for medical reasons / religious services and are infrequent or of short duration when for other reasons). If current dressing causes regression in wound condition, may D/C ordered dressing product/s and apply Normal Saline Moist Dressing daily until next [place] / Other MD appointment. [place] of regression in wound condition at [phone]. Please direct any NON-WOUND related issues/requests for orders to patient's Primary Care Physician Wound #7 Right Ischial Tuberosity: [place] Visits - [facility] Nurse may visit PRN to address patient s wound care needs. FACE TO FACE ENCOUNTER: MEDICARE and MEDICAID PATIENTS: I certify that this patient is under my care and that I had a face-to-face encounter that meets the physician face-to-face encounter requirements with this patient on this date. The encounter with the patient was in whole or in part for the following MEDICAL CONDITION: (primary reason for [place]) MEDICAL NECESSITY: I certify, that based on my findings, NURSING services are a medically necessary home health service. HOME BOUND STATUS: I certify that my clinical findings support that this patient is homebound (i.e., Due to illness or injury, pt requires aid of supportive devices such as crutches, cane, wheelchairs, walkers, the use of special transportation or the assistance of another person to leave their place of residence. There is a normal inability to leave the home and doing so requires considerable and taxing effort. Other absences are for medical reasons / religious services and are infrequent or of short duration when for other reasons). If  current dressing causes regression in wound condition, may D/C ordered dressing product/s and apply Normal Saline Moist Dressing daily until next [place] / Other MD appointment. [place] of regression in wound condition at [phone]. Please direct any NON-WOUND related issues/requests for orders to patient's Primary Care Physician I had a long discussion with the patient and husband regarding the need to do an MRI with and without IV contrast. The possibility of osteomyelitis of the spine is concerned and this would be the best way to diagnose it. She is agreeable but does not want to have it right away. Continue local care with Santyl ointment to the thoracic spine wound and Prisma AG to the right hip and both heels. She will go back to see as next week Electronic Signature(s) Signed: 04/19/2016 7:59:17 AM By: Mckenzie Fudge MD, FACS Previous Signature: 04/19/2016 7:59:06 AM Version By: Mckenzie Fudge MD, FACS Previous Signature: 04/16/2016 4:13:26 PM Version By: Mckenzie Fudge MD, FACS [name], Avina H. ([id number]) Entered By: [name] on 04/19/2016 07:59:17 [place], Dejha H. ([id number]) -------------------------------------------------------------------------------- SuperBill Details Patient Name: Caulfield, Hannahgrace H. Date of Service: 04/16/2016 Medical Record Number: [id number] Patient Account Number: [id number] Date of Birth/Sex: [birth year] (70 y.o. 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, stage 3 L89.613 Pressure ulcer of  right  heel, stage 3 Facility Procedures CPT4 Code: [id number] Description: [id number] - WOUND CARE VISIT-LEV 4 EST PT Modifier: Quantity: 1 Physician Procedures CPT4 Code Description: [id number] - WC PHYS LEVEL 3 - EST PT ICD-10 Description Diagnosis E11.621 Type 2 diabetes mellitus with foot ulcer F17.218 Nicotine dependence, cigarettes, with other nicoti L89.100 Pressure ulcer of unspecified part of back,  unstag L89.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

## 2016-04-19 ENCOUNTER — Encounter: Payer: Self-pay | Admitting: Emergency Medicine

## 2016-04-19 ENCOUNTER — Emergency Department
Admission: EM | Admit: 2016-04-19 | Discharge: 2016-04-19 | Disposition: A | Discharge status: Home / Self Care | Payer: Medicare Other | Attending: Emergency Medicine | Admitting: Emergency Medicine

## 2016-04-19 DIAGNOSIS — Z79899 Other long term (current) drug therapy: Secondary | ICD-10-CM | POA: Insufficient documentation

## 2016-04-19 DIAGNOSIS — Z79891 Long term (current) use of opiate analgesic: Secondary | ICD-10-CM | POA: Insufficient documentation

## 2016-04-19 DIAGNOSIS — E860 Dehydration: Secondary | ICD-10-CM

## 2016-04-19 DIAGNOSIS — E119 Type 2 diabetes mellitus without complications: Secondary | ICD-10-CM | POA: Insufficient documentation

## 2016-04-19 DIAGNOSIS — R4182 Altered mental status, unspecified: Secondary | ICD-10-CM | POA: Diagnosis present

## 2016-04-19 DIAGNOSIS — Z853 Personal history of malignant neoplasm of breast: Secondary | ICD-10-CM | POA: Insufficient documentation

## 2016-04-19 DIAGNOSIS — F17200 Nicotine dependence, unspecified, uncomplicated: Secondary | ICD-10-CM | POA: Insufficient documentation

## 2016-04-19 DIAGNOSIS — I10 Essential (primary) hypertension: Secondary | ICD-10-CM | POA: Insufficient documentation

## 2016-04-19 DIAGNOSIS — E039 Hypothyroidism, unspecified: Secondary | ICD-10-CM | POA: Insufficient documentation

## 2016-04-19 LAB — COMPREHENSIVE METABOLIC PANEL
ALBUMIN: 2.5 g/dL — AB (ref 3.5–5.0)
ALK PHOS: 654 U/L — AB (ref 38–126)
ALT: 38 U/L (ref 14–54)
ANION GAP: 6 (ref 5–15)
AST: 64 U/L — ABNORMAL HIGH (ref 15–41)
BUN: 7 mg/dL (ref 6–20)
CALCIUM: 9 mg/dL (ref 8.9–10.3)
CHLORIDE: 108 mmol/L (ref 101–111)
CO2: 23 mmol/L (ref 22–32)
Creatinine, Ser: 0.63 mg/dL (ref 0.44–1.00)
GFR calc Af Amer: >60 mL/min (ref >60)
GFR calc non Af Amer: >60 mL/min (ref >60)
GLUCOSE: 162 mg/dL — AB (ref 65–99)
POTASSIUM: 3 mmol/L — AB (ref 3.5–5.1)
SODIUM: 137 mmol/L (ref 135–145)
Total Bilirubin: 2.7 mg/dL — ABNORMAL HIGH (ref 0.3–1.2)
Total Protein: 5.5 g/dL — ABNORMAL LOW (ref 6.5–8.1)

## 2016-04-19 LAB — URINALYSIS COMPLETE WITH MICROSCOPIC (ARMC ONLY)
BACTERIA UA: NONE SEEN
Bilirubin Urine: NEGATIVE
Glucose, UA: NEGATIVE mg/dL
Hgb urine dipstick: NEGATIVE
KETONES UR: NEGATIVE mg/dL
LEUKOCYTES UA: NEGATIVE
Nitrite: NEGATIVE
PH: 6 (ref 5.0–8.0)
PROTEIN: 30 mg/dL — AB
Specific Gravity, Urine: 1.012 (ref 1.005–1.030)

## 2016-04-19 LAB — CBC
HCT: 35 % (ref 35.0–47.0)
HEMOGLOBIN: 12.1 g/dL (ref 12.0–16.0)
MCH: 34 pg (ref 26.0–34.0)
MCHC: 34.5 g/dL (ref 32.0–36.0)
MCV: 98.5 fL (ref 80.0–100.0)
Platelets: 299 10*3/uL (ref 150–440)
RBC: 3.55 MIL/uL — AB (ref 3.80–5.20)
RDW: 13.4 % (ref 11.5–14.5)
WBC: 13.3 10*3/uL — ABNORMAL HIGH (ref 3.6–11.0)

## 2016-04-19 MED ORDER — ONDANSETRON HCL 4 MG/2ML IJ SOLN: 4.0000 mg | Freq: Once | INTRAMUSCULAR | Status: DC

## 2016-04-19 MED ORDER — POTASSIUM CHLORIDE 20 MEQ/15ML (10%) PO SOLN
20.0000 meq | ORAL | Status: AC
  Administered 2016-04-19: 20 meq via ORAL
  Filled 2016-04-19: qty 15

## 2016-04-19 MED ORDER — SODIUM CHLORIDE 0.9 % IV BOLUS (SEPSIS)
1000.0000 mL | Freq: Once | INTRAVENOUS | Status: AC
  Administered 2016-04-19: 1000 mL via INTRAVENOUS

## 2016-04-19 NOTE — ED Triage Notes (Addendum)
Pt presents with some confusion and possible dehydration over the past week or so. Pt reports weight loss and loss of appetite.  Husband reports recent bladder infection and is currently taking antibiotics and has been taking for three to four days.

## 2016-04-19 NOTE — ED Provider Notes (Signed)
[facility] Emergency Department Provider Note   ____________________________________________   First MD Initiated Contact with Patient 04/19/16 1834     (approximate)  I have reviewed the triage vital signs and the nursing notes.   HISTORY  Chief Complaint Altered Mental Status and Dehydration    HPI [name] is a 70 y.o. female who comes with her husband.  Patient and Husband reports the last few days, but she has had increased weakness and fatigue. She had a urine sample and was told she had a urinary tract infection and has been taking Macrobid for the last 2 days. She continues to have fatigue, husband has to assist her up and down the stairs at home, and at times she seems slightly confused. She has not been eating or drinking hardly anything except about one milkshake a day which is normal for her, but she gets dehydrated frequently per the husband.  Patient denies being in pain, she does have significant slow healing wounds on the back and legs which are followed by the wound care clinic regularly and have regular dressing changes by the husband. They report these wounds are slowly healing, though the wound care doctor did want her to get an MRI to evaluate for possible bone infection the patient is still working through this and isn't sure she truly wants to have an MRI done at this point.  Husband reports that they feel that she likely needs to receive some hydration because she is such a poor eater.  Discussed with the patient, she reports to me that under no circumstances would she be admitted to a hospital stay. She offered this without my discussing with her, and both she and her husband affirm that she does not wish to be hospitalized and that they wish for her to get IV fluids and check for urinary tract infection during today's visit.  We further discussed her goals of care, and appears through shared medical decision making and discussion  of the goals for today's visit that she wishes to receive IV fluids, and to be discharged home to follow up closely with her primary doctor. The husband agrees with this plan. I discussed that she looks very weak, appears very malnourished and that hospitalization may be beneficial however the patient has been again tell me that they do not want her to be hospitalized and goal of today is to be discharged after hydration. After discussing this, I am agreeable with this plan and wished to offer the patient treatment including hydration, laboratory evaluation, and recheck of her urinalysis today.   Past Medical History:  Diagnosis Date  . Bed sore on elbow   . Bed sore on heel   . Breast cancer ([place])    breast-left  . Diabetes mellitus without complication ([place])   . GERD (gastroesophageal reflux disease)   . Hypertension   . Hypothyroidism   . Neuropathy [facility])     Patient Active Problem List   Diagnosis Date Noted  . Stage II pressure ulcer of left heel 03/26/2016  . Chronic pain syndrome 09/26/2015  . Dehydration 09/24/2015  . Sepsis ([place]) 09/09/2015  . Decubitus ulcer, infected 09/09/2015  . 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 Stage II pressure ulcer of right elbow 07/04/2015  . Stage II pressure ulcer of right heel 07/04/2015  . Fracture, humerus, proximal 04/24/2015    Past Surgical History:  Procedure Laterality Date  . BREAST LUMPECTOMY Left   .  CHOLECYSTECTOMY    . ESOPHAGOGASTRODUODENOSCOPY N/A 09/12/2015   Procedure: ESOPHAGOGASTRODUODENOSCOPY (EGD);  Surgeon: Manya Silvas, MD;  Location[facility] ENDOSCOPY;  Service: Endoscopy;  Laterality: N/A;  . ORIF HUMERUS FRACTURE Right 04/24/2015   Procedure: OPEN REDUCTION INTERNAL FIXATION (ORIF) PROXIMAL HUMERUS FRACTURE;  Surgeon: Corky Mull, MD;  Location: [facility] ORS;  Service: Orthopedics;  Laterality: Right;    Prior to Admission medications   Medication Sig Start Date End Date Taking?  Authorizing Provider  ALPRAZolam (XANAX) 0.25 MG tablet Take 0.25 mg by mouth 2 (two) times daily.    Historical Provider, MD  feeding supplement, GLUCERNA SHAKE, (GLUCERNA SHAKE) LIQD Take 237 mLs by mouth 2 (two) times daily between meals. 09/16/15   Vaughan Basta, MD  fentaNYL (DURAGESIC - DOSED MCG/HR) 50 MCG/HR Place 1 patch (50 mcg total) onto the skin every 3 (three) days. 09/30/15   Henreitta Leber, MD  fexofenadine (ALLEGRA) 180 MG tablet Take 180 mg by mouth daily as needed for allergies.     Historical Provider, MD  gabapentin (NEURONTIN) 400 MG capsule Take 400 mg by mouth 3 (three) times daily.    Historical Provider, MD  lansoprazole (PREVACID) 30 MG capsule Take 30 mg by mouth daily at 12 noon.    Historical Provider, MD  magnesium oxide (MAG-OX) 400 (241.3 Mg) MG tablet Take 1 tablet (400 mg total) by mouth 2 (two) times daily. 09/16/15   Vaughan Basta, MD  megestrol (MEGACE) 40 MG tablet Take 1 tablet (40 mg total) by mouth daily. 09/16/15   Vaughan Basta, MD  mometasone (NASONEX) 50 MCG/ACT nasal spray Place 2 sprays into the nose daily as needed (allergies).     Historical Provider, MD  morphine (MS CONTIN) 30 MG 12 hr tablet Take 1 tablet (30 mg total) by mouth every 12 (twelve) hours. 09/16/15   Vaughan Basta, MD  olopatadine (PATANOL) 0.1 % ophthalmic solution Place 1 drop into both eyes 2 (two) times daily as needed for allergies.     Historical Provider, MD  ondansetron (ZOFRAN) 4 MG tablet Take 1 tablet (4 mg total) by mouth every 8 (eight) hours as needed for nausea. 09/16/15   Vaughan Basta, MD  oxyCODONE (OXY IR/ROXICODONE) 5 MG immediate release tablet Take 1 tablet (5 mg total) by mouth every 4 (four) hours as needed for breakthrough pain. 09/16/15   Vaughan Basta, MD  oxyCODONE 10 MG TABS Take 1 tablet (10 mg total) by mouth every 3 (three) hours as needed for severe pain or breakthrough pain. 09/30/15   Henreitta Leber, MD  polyethylene  glycol (MIRALAX / GLYCOLAX) packet Take 17 g by mouth 2 (two) times daily. 09/16/15   Vaughan Basta, MD  potassium chloride 20 MEQ TBCR Take 10 mEq by mouth 2 (two) times daily. 09/16/15   Vaughan Basta, MD  senna-docusate (SENOKOT-S) 8.6-50 MG tablet Take 1 tablet by mouth at bedtime as needed for mild constipation. 09/16/15   Vaughan Basta, MD  zaleplon (SONATA) 10 MG capsule Take 10 mg by mouth at bedtime as needed for sleep.    Historical Provider, MD    Allergies Boniva [ibandronic acid]; Codeine; Erythromycin; and Levaquin [levofloxacin in d5w]  Family History  Problem Relation Age of Onset  . Breast cancer Mother     Social History Social History  Substance Use Topics  . Smoking status: Current Every Day Smoker    Packs/day: 1.00    Years: 40.00  . Smokeless tobacco: Never Used  . Alcohol use 0.0  oz/week     Comment: daily    Review of Systems Constitutional: No fever/chills Eyes: No visual changes. ENT: No sore throat. Cardiovascular: Denies chest pain. Respiratory: Denies shortness of breath. Gastrointestinal: No abdominal pain.  No nausea, no vomiting.  No diarrhea.  No constipation. Genitourinary: Slight increase in urination, suspected urinary tract infection. Currently on treatment. Musculoskeletal: Negative for back pain except for her chronic discomfort due to pressure ulcer. Skin: Negative for rash except for her pressure ulcers which are slowly healing. Neurological: Negative for headaches, focal weakness or numbness.  10-point ROS otherwise negative.  ____________________________________________   PHYSICAL EXAM:  VITAL SIGNS: ED Triage Vitals  Enc Vitals Group     BP 04/19/16 1457 103/69     Pulse Rate 04/19/16 1457 (!) 117     Resp 04/19/16 1457 17     Temp 04/19/16 1457 98 F (36.7 C)     Temp Source 04/19/16 1457 Oral     SpO2 04/19/16 1457 100 %     Weight 04/19/16 1457 90 lb (40.8 kg)     Height 04/19/16 1457 5\' 5"  (1.651  m)     Head Circumference --      Peak Flow --      Pain Score 04/19/16 1804 6     Pain Loc --      Pain Edu? --      Excl. in [place]? --     Constitutional: Alert and orientedTo self and place situation and recent treatments including antibiotics by her doctor. Well appearing and in no acute distress. She does report that it is September, but also she and husband affirm that is not unusual for her to have difficulty recalling dates. She does appear slightly disoriented though, at least not well oriented to month. The patient is very cachectic husband and patient report that she is been about 85-90 pounds for the last several months. 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 PERRL. EOMI. Head: Atraumatic. Nose: No congestion/rhinnorhea. Mouth/Throat: Mucous membranes are dry.  Oropharynx non-erythematous. Neck: No stridor.  Tender over the back where she has bandaged wounds. Discussed and the patient has had her wounds cared for today, and she does not wish for me to take down wound dressings to evaluate them today. Patient has been reported there slowly healing and she sees a wound care clinic regular for this already. Cardiovascular: Normal rate, regular rhythm. Grossly normal heart sounds.  Good peripheral circulation. Respiratory: Normal respiratory effort.  No retractions. Lungs CTAB. Gastrointestinal: Soft and nontender though she does have mild wounds over the lower legs bilateral, again these are dressed and they do not wish for me to have them evaluated but she follows closely with wound care. No distention.  Musculoskeletal: No lower extremity tenderness nor edema.  No joint effusions. Neurologic:  Normal speech and language. No gross focal neurologic deficits are appreciated. She is well oriented. Skin:  Skin is warm, dry and  intact. No rash noted. Psychiatric: Mood and affect are normal. Speech and behavior are normal.  ____________________________________________   LABS (all labs ordered are listed, but only abnormal results are displayed)  Labs Reviewed  URINALYSIS COMPLETEWITH MICROSCOPIC ([place] ONLY) - Abnormal; Notable for the following:       Result Value   Color, Urine AMBER (*)    APPearance CLEAR (*)  Protein, ur 30 (*)    Squamous Epithelial / LPF 0-5 (*)    All other components within normal limits  CBC - Abnormal; Notable for the following:    WBC 13.3 (*)    RBC 3.55 (*)    All other components within normal limits  COMPREHENSIVE METABOLIC PANEL - Abnormal; Notable for the following:    Potassium 3.0 (*)    Glucose, Bld 162 (*)    Total Protein 5.5 (*)    Albumin 2.5 (*)    AST 64 (*)    Alkaline Phosphatase 654 (*)    Total Bilirubin 2.7 (*)    All other components within normal limits  URINE CULTURE   ____________________________________________  EKG   ____________________________________________  RADIOLOGY  We discussed the benefits of the CT scan to evaluate for changes in her mental status and slight confusion, but after discussing with both patient and her husband they do not wish for a CT scan of the head to evaluate for causes such as stroke or other possible cause of her slight confusion today. This seems to be consistent with her goals of care as they laid out earlier. ____________________________________________   PROCEDURES  Procedure(s) performed: None  Procedures  Critical Care performed: No  ____________________________________________   INITIAL IMPRESSION / ASSESSMENT AND PLAN / ED COURSE  Pertinent labs & imaging results that were available during my care of the patient were reviewed by me and considered in my medical decision making (see chart for details).  Patient quite cachectic. Having increasing fatigue and weakness. Labs do not clearly  indicate urinary tract infection though she has been on treatment for the last 2 days, and it may have cleared up some. I do report that her doctor called and said that she had a urinary tract infection by laboratory testing and is an antibiotic. She is afebrile, no leukocytosis, and hemodynamically stable. She does appear quite cachectic however and I will hydrate her here as she reports very poor oral intake.  Her albumin is quite low  I discussed further with the patient and husband, and offered further evaluation for causes of confusion including infection, CT of the head, and further evaluation but the patient refuses. Both patient and her husband are agreeable with this plan, do not wish to be hospitalized, but simply wrist receive IV hydration and to follow up closely with her primary care doctor. I will grant their request, and I will discuss very careful precautions for return with both husband and patient. In addition, they report that they should be able to see their primary care doctor as soon as tomorrow as Dr. Carrie Mew is excellent in following her closely clinically.  Clinical Course    ----------------------------------------- 9:24 PM on 04/19/2016 -----------------------------------------  Patient reports improvement. She is sitting upright, conversant and appears improved. This patient has been comfortable with plan to continue her current antibiotic, and they'll follow up with her primary care doctor tomorrow. Return precautions and treatment recommendations and follow-up discussed with the patient who is agreeable with the plan.  ____________________________________________   FINAL CLINICAL IMPRESSION(S) / ED DIAGNOSES  Final diagnoses:  Dehydration      NEW MEDICATIONS STARTED DURING THIS VISIT:  New Prescriptions   No medications on file     Note:  This document was prepared using Dragon voice recognition software and may include unintentional dictation  errors.     Delman Kitten, MD 04/19/16 2125

## 2016-04-19 NOTE — Discharge Instructions (Signed)
You have been seen in the Emergency Department (ED) today for dehydration and likely a urinary tract infection (which you are on antibiotics for already) contributed to your dehydration. Continue your antibiotics and follow-up with your regular doctor tomorrow in the clinic.   Call your regular doctor to schedule the next available appointment to follow up on today?s ED visit, or return immediately to the ED if your pain worsens, you have decreased urine production, develop fever, persistent vomiting, or other symptoms that concern you.

## 2016-04-19 NOTE — ED Notes (Signed)
Two IV attempts made by Ramond Dial in RAC and LF.  Second nurse will make third attempt.

## 2016-04-21 LAB — URINE CULTURE: Special Requests: NORMAL

## 2016-04-23 ENCOUNTER — Ambulatory Visit: Payer: Medicare Other | Admitting: Surgery

## 2016-04-26 ENCOUNTER — Encounter: Payer: Self-pay | Admitting: Medical Oncology

## 2016-04-26 ENCOUNTER — Emergency Department
Admission: EM | Admit: 2016-04-26 | Discharge: 2016-04-26 | Disposition: A | Discharge status: Home / Self Care | Payer: Medicare Other | Attending: Student in an Organized Health Care Education/Training Program | Admitting: Student in an Organized Health Care Education/Training Program

## 2016-04-26 DIAGNOSIS — M546 Pain in thoracic spine: Secondary | ICD-10-CM

## 2016-04-26 DIAGNOSIS — E039 Hypothyroidism, unspecified: Secondary | ICD-10-CM | POA: Diagnosis not present

## 2016-04-26 DIAGNOSIS — I10 Essential (primary) hypertension: Secondary | ICD-10-CM | POA: Insufficient documentation

## 2016-04-26 DIAGNOSIS — L89103 Pressure ulcer of unspecified part of back, stage 3: Secondary | ICD-10-CM | POA: Insufficient documentation

## 2016-04-26 DIAGNOSIS — E860 Dehydration: Secondary | ICD-10-CM | POA: Insufficient documentation

## 2016-04-26 DIAGNOSIS — M869 Osteomyelitis, unspecified: Secondary | ICD-10-CM | POA: Diagnosis not present

## 2016-04-26 DIAGNOSIS — L89529 Pressure ulcer of left ankle, unspecified stage: Secondary | ICD-10-CM | POA: Diagnosis not present

## 2016-04-26 DIAGNOSIS — L89519 Pressure ulcer of right ankle, unspecified stage: Secondary | ICD-10-CM | POA: Diagnosis not present

## 2016-04-26 DIAGNOSIS — E119 Type 2 diabetes mellitus without complications: Secondary | ICD-10-CM | POA: Diagnosis not present

## 2016-04-26 DIAGNOSIS — F17200 Nicotine dependence, unspecified, uncomplicated: Secondary | ICD-10-CM | POA: Insufficient documentation

## 2016-04-26 DIAGNOSIS — Z79899 Other long term (current) drug therapy: Secondary | ICD-10-CM | POA: Insufficient documentation

## 2016-04-26 DIAGNOSIS — Z853 Personal history of malignant neoplasm of breast: Secondary | ICD-10-CM | POA: Insufficient documentation

## 2016-04-26 LAB — BASIC METABOLIC PANEL
ANION GAP: 9 (ref 5–15)
BUN: 6 mg/dL (ref 6–20)
CALCIUM: 9 mg/dL (ref 8.9–10.3)
CHLORIDE: 108 mmol/L (ref 101–111)
CO2: 19 mmol/L — AB (ref 22–32)
CREATININE: 0.56 mg/dL (ref 0.44–1.00)
GFR calc non Af Amer: >60 mL/min (ref >60)
Glucose, Bld: 154 mg/dL — ABNORMAL HIGH (ref 65–99)
Potassium: 2.6 mmol/L — CL (ref 3.5–5.1)
SODIUM: 136 mmol/L (ref 135–145)

## 2016-04-26 LAB — URINALYSIS COMPLETE WITH MICROSCOPIC (ARMC ONLY)
Bacteria, UA: NONE SEEN
Bilirubin Urine: NEGATIVE
Glucose, UA: NEGATIVE mg/dL
HGB URINE DIPSTICK: NEGATIVE
Nitrite: NEGATIVE
PH: 7 (ref 5.0–8.0)
PROTEIN: NEGATIVE mg/dL
SPECIFIC GRAVITY, URINE: 1.01 (ref 1.005–1.030)

## 2016-04-26 LAB — CBC
HCT: 38 % (ref 35.0–47.0)
HEMOGLOBIN: 13 g/dL (ref 12.0–16.0)
MCH: 34 pg (ref 26.0–34.0)
MCHC: 34.1 g/dL (ref 32.0–36.0)
MCV: 99.8 fL (ref 80.0–100.0)
PLATELETS: 402 10*3/uL (ref 150–440)
RBC: 3.81 MIL/uL (ref 3.80–5.20)
RDW: 13.8 % (ref 11.5–14.5)
WBC: 12.6 10*3/uL — AB (ref 3.6–11.0)

## 2016-04-26 LAB — HEPATIC FUNCTION PANEL
ALK PHOS: 500 U/L — AB (ref 38–126)
ALT: 32 U/L (ref 14–54)
AST: 42 U/L — ABNORMAL HIGH (ref 15–41)
Albumin: 2.9 g/dL — ABNORMAL LOW (ref 3.5–5.0)
BILIRUBIN DIRECT: 1 mg/dL — AB (ref 0.1–0.5)
BILIRUBIN INDIRECT: 1.5 mg/dL — AB (ref 0.3–0.9)
BILIRUBIN TOTAL: 2.5 mg/dL — AB (ref 0.3–1.2)
Total Protein: 6.2 g/dL — ABNORMAL LOW (ref 6.5–8.1)

## 2016-04-26 LAB — MAGNESIUM: Magnesium: 1.8 mg/dL (ref 1.7–2.4)

## 2016-04-26 LAB — SEDIMENTATION RATE: Sed Rate: 6 mm/hr (ref 0–30)

## 2016-04-26 LAB — LACTIC ACID, PLASMA: LACTIC ACID, VENOUS: 1.6 mmol/L (ref 0.5–1.9)

## 2016-04-26 MED ORDER — POTASSIUM CHLORIDE IN NACL 20-0.9 MEQ/L-% IV SOLN
INTRAVENOUS | Status: AC
  Filled 2016-04-26: qty 1000

## 2016-04-26 MED ORDER — OXYCODONE HCL 5 MG PO TABS
ORAL_TABLET | ORAL | Status: AC
  Administered 2016-04-26: 5 mg via ORAL
  Filled 2016-04-26: qty 1

## 2016-04-26 MED ORDER — SODIUM CHLORIDE 0.9 % IV BOLUS (SEPSIS)
500.0000 mL | Freq: Once | INTRAVENOUS | Status: AC
  Administered 2016-04-26: 500 mL via INTRAVENOUS

## 2016-04-26 MED ORDER — POTASSIUM CHLORIDE ER 10 MEQ PO TBCR: 20.0000 meq | EXTENDED_RELEASE_TABLET | Freq: Every day | ORAL | 0 refills | Status: AC

## 2016-04-26 MED ORDER — SODIUM CHLORIDE 0.9 % IV BOLUS (SEPSIS)
1000.0000 mL | Freq: Once | INTRAVENOUS | Status: AC
  Administered 2016-04-26: 1000 mL via INTRAVENOUS

## 2016-04-26 MED ORDER — POTASSIUM CHLORIDE CRYS ER 20 MEQ PO TBCR
40.0000 meq | EXTENDED_RELEASE_TABLET | Freq: Once | ORAL | Status: AC
  Administered 2016-04-26: 40 meq via ORAL
  Filled 2016-04-26: qty 2

## 2016-04-26 MED ORDER — POTASSIUM CHLORIDE 10 MEQ/100ML IV SOLN
10.0000 meq | Freq: Once | INTRAVENOUS | Status: AC
  Administered 2016-04-26: 10 meq via INTRAVENOUS
  Filled 2016-04-26: qty 100

## 2016-04-26 MED ORDER — OXYCODONE HCL 5 MG PO TABS
5.0000 mg | ORAL_TABLET | ORAL | Status: DC | PRN
  Administered 2016-04-26 (×2): 5 mg via ORAL
  Filled 2016-04-26: qty 1

## 2016-04-26 MED ORDER — POTASSIUM CHLORIDE IN NACL 20-0.9 MEQ/L-% IV SOLN
Freq: Once | INTRAVENOUS | Status: AC
  Administered 2016-04-26: 18:00:00 via INTRAVENOUS
  Filled 2016-04-26: qty 1000

## 2016-04-26 NOTE — ED Triage Notes (Signed)
Pt from PCP office with reports that her heart rate was 150. Per pt she needs IV fluids. She was just here last week and was going today for f/u appt. Pt reports that she feels weak.

## 2016-04-26 NOTE — Discharge Instructions (Signed)
Please follow up with [facility] tomorrow regarding scheduling of outpatient MRI. Return immediately for any worsening fevers, worsening dehydration or intractable pain. Continue to drink plenty of fluids and try to eat as much protein as possible.

## 2016-04-26 NOTE — ED Notes (Signed)
Pt. And family Verbalizes understanding of d/c instructions, prescriptions, and follow-up. VS stable and pain controlled per pt.  Pt. In NAD at time of d/c and denies further concerns regarding this visit. Pt.wheeled Out of the unit by husband. Pt advised to return to the ED at any time for emergent concerns, or for new/worsening symptoms.

## 2016-04-26 NOTE — ED Provider Notes (Signed)
[facility] Emergency Department Provider Note    None    (approximate)  I have reviewed the triage vital signs and the nursing notes.   HISTORY  Chief Complaint Tachycardia    HPI [name] is a 70 y.o. female with a chronic midthoracic pressure ulcer pain followed by wound care clinic with concern for developing osteomyelitis with plan for outpatient MRI that has not yet been performed presents with tachycardia and generalized malaise. Patient states that she was here last Monday for IV fluids. At that time she refused admission to the hospital despite meeting Sirs criteria. Her symptoms improved after 1 bolus of IV fluids and she was discharged home. For the past 5 days the patient has had decreased appetite, fatigue and generalized malaise. She denies any shortness of breath or chest pain. Denies any lower extremity swelling. No nausea or vomiting. States that she has chronic back pain which prevents her from laying flat.. States that she tries to eat supplemental drinks but has been unable to stay hydrated. She denies any dysuria.  Again prior to full discussion patient is adamant that she will not stay in the hospital.   Past Medical History:  Diagnosis Date  . Bed sore on elbow   . Bed sore on heel   . Breast cancer ([place])    breast-left  . Diabetes mellitus without complication ([place])   . GERD (gastroesophageal reflux disease)   . Hypertension   . Hypothyroidism   . Neuropathy [facility])     Patient Active Problem List   Diagnosis Date Noted  . Stage II pressure ulcer of left heel 03/26/2016  . Chronic pain syndrome 09/26/2015  . Dehydration 09/24/2015  . Sepsis ([place]) 09/09/2015  . Decubitus ulcer, infected 09/09/2015  . Stage III pressure ulcer of right upper back ([place]) 07/04/2015  . Stage II pressure ulcer of right elbow 07/04/2015  . Stage II pressure ulcer of right heel 07/04/2015  . Fracture, humerus, proximal 04/24/2015    Past  Surgical History:  Procedure Laterality Date  . BREAST LUMPECTOMY Left   . CHOLECYSTECTOMY    . ESOPHAGOGASTRODUODENOSCOPY N/A 09/12/2015   Procedure: ESOPHAGOGASTRODUODENOSCOPY (EGD);  Surgeon: Manya Silvas, MD;  Location: [facility] ENDOSCOPY;  Service: Endoscopy;  Laterality: N/A;  . ORIF HUMERUS FRACTURE Right 04/24/2015   Procedure: OPEN REDUCTION INTERNAL FIXATION (ORIF) PROXIMAL HUMERUS FRACTURE;  Surgeon: Corky Mull, MD;  Location: [facility] ORS;  Service: Orthopedics;  Laterality: Right;    Prior to Admission medications   Medication Sig Start Date End Date Taking? Authorizing Provider  ALPRAZolam (XANAX) 0.25 MG tablet Take 0.25 mg by mouth 2 (two) times daily.    Historical Provider, MD  feeding supplement, GLUCERNA SHAKE, (GLUCERNA SHAKE) LIQD Take 237 mLs by mouth 2 (two) times daily between meals. 09/16/15   Vaughan Basta, MD  fentaNYL (DURAGESIC - DOSED MCG/HR) 50 MCG/HR Place 1 patch (50 mcg total) onto the skin every 3 (three) days. 09/30/15   Henreitta Leber, MD  fexofenadine (ALLEGRA) 180 MG tablet Take 180 mg by mouth daily as needed for allergies.     Historical Provider, MD  gabapentin (NEURONTIN) 400 MG capsule Take 400 mg by mouth 3 (three) times daily.    Historical Provider, MD  lansoprazole (PREVACID) 30 MG capsule Take 30 mg by mouth daily at 12 noon.    Historical Provider, MD  magnesium oxide (MAG-OX) 400 (241.3 Mg) MG tablet Take 1 tablet (400 mg total) by mouth 2 (two)  times daily. 09/16/15   Vaughan Basta, MD  megestrol (MEGACE) 40 MG tablet Take 1 tablet (40 mg total) by mouth daily. 09/16/15   Vaughan Basta, MD  mometasone (NASONEX) 50 MCG/ACT nasal spray Place 2 sprays into the nose daily as needed (allergies).     Historical Provider, MD  morphine (MS CONTIN) 30 MG 12 hr tablet Take 1 tablet (30 mg total) by mouth every 12 (twelve) hours. 09/16/15   Vaughan Basta, MD  olopatadine (PATANOL) 0.1 % ophthalmic solution Place 1 drop into both  eyes 2 (two) times daily as needed for allergies.     Historical Provider, MD  ondansetron (ZOFRAN) 4 MG tablet Take 1 tablet (4 mg total) by mouth every 8 (eight) hours as needed for nausea. 09/16/15   Vaughan Basta, MD  oxyCODONE (OXY IR/ROXICODONE) 5 MG immediate release tablet Take 1 tablet (5 mg total) by mouth every 4 (four) hours as needed for breakthrough pain. 09/16/15   Vaughan Basta, MD  oxyCODONE 10 MG TABS Take 1 tablet (10 mg total) by mouth every 3 (three) hours as needed for severe pain or breakthrough pain. 09/30/15   Henreitta Leber, MD  polyethylene glycol (MIRALAX / GLYCOLAX) packet Take 17 g by mouth 2 (two) times daily. 09/16/15   Vaughan Basta, MD  potassium chloride 20 MEQ TBCR Take 10 mEq by mouth 2 (two) times daily. 09/16/15   Vaughan Basta, MD  senna-docusate (SENOKOT-S) 8.6-50 MG tablet Take 1 tablet by mouth at bedtime as needed for mild constipation. 09/16/15   Vaughan Basta, MD  zaleplon (SONATA) 10 MG capsule Take 10 mg by mouth at bedtime as needed for sleep.    Historical Provider, MD    Allergies Boniva [ibandronic acid]; Codeine; Erythromycin; and Levaquin [levofloxacin in d5w]  Family History  Problem Relation Age of Onset  . Breast cancer Mother     Social History Social History  Substance Use Topics  . Smoking status: Current Every Day Smoker    Packs/day: 1.00    Years: 40.00  . Smokeless tobacco: Never Used  . Alcohol use 0.0 oz/week     Comment: daily    Review of Systems Patient denies headaches, rhinorrhea, blurry vision, numbness, shortness of breath, chest pain, edema, cough, abdominal pain, nausea, vomiting, diarrhea, dysuria, fevers, rashes or hallucinations unless otherwise stated above in HPI. ____________________________________________   PHYSICAL EXAM:  VITAL SIGNS: Vitals:   04/26/16 1424  BP: 99/82  Pulse: (!) 135  Resp: 18  Temp: 97.5 F (36.4 C)    Constitutional: Alert and oriented.  Chronically ill, cachectic and frail appearing. Eyes: Conjunctivae are normal. PERRL. EOMI. Head: Atraumatic. Nose: No congestion/rhinnorhea. Mouth/Throat: Mucous membranes are dry  Oropharynx non-erythematous. Neck: No stridor. Painless ROM. No cervical spine tenderness to palpation Hematological/Lymphatic/Immunilogical: No cervical lymphadenopathy. Cardiovascular: Normal rate, regular rhythm. Grossly normal heart sounds.  Good peripheral circulation. Respiratory: Normal respiratory effort.  No retractions. Lungs CTAB. Gastrointestinal: Soft and nontender. No distention. No abdominal bruits. No CVA tenderness.  Musculoskeletal: No lower extremity tenderness nor edema.  No joint effusions. She has bilateral decubitus ulcers of her ankles and a chronic midthoracic spine pressure ulcer with purulent drainage but no surrounding erythema. Neurologic:  Normal speech and language. No gross focal neurologic deficits are appreciated. No gait instability. Skin:  Skin is warm, dry and intact. No rash noted. Psychiatric: Mood and affect are normal. Speech and behavior are normal.  ____________________________________________   LABS (all labs ordered are listed, but only abnormal results  are displayed)  Results for orders placed or performed during the hospital encounter of 04/26/16 (from the past 24 hour(s))  CBC     Status: Abnormal   Collection Time: 04/26/16  2:27 PM  Result Value Ref Range   WBC 12.6 (H) 3.6 - 11.0 K/uL   RBC 3.81 3.80 - 5.20 MIL/uL   Hemoglobin 13.0 12.0 - 16.0 g/dL   HCT 38.0 35.0 - 47.0 %   MCV 99.8 80.0 - 100.0 fL   MCH 34.0 26.0 - 34.0 pg   MCHC 34.1 32.0 - 36.0 g/dL   RDW 13.8 11.5 - 14.5 %   Platelets 402 150 - 440 K/uL  Urinalysis complete, with microscopic     Status: Abnormal   Collection Time: 04/26/16  2:27 PM  Result Value Ref Range   Color, Urine YELLOW (A) YELLOW   APPearance HAZY (A) CLEAR   Glucose, UA NEGATIVE NEGATIVE mg/dL   Bilirubin Urine  NEGATIVE NEGATIVE   Ketones, ur TRACE (A) NEGATIVE mg/dL   Specific Gravity, Urine 1.010 1.005 - 1.030   Hgb urine dipstick NEGATIVE NEGATIVE   pH 7.0 5.0 - 8.0   Protein, ur NEGATIVE NEGATIVE mg/dL   Nitrite NEGATIVE NEGATIVE   Leukocytes, UA TRACE (A) NEGATIVE   RBC / HPF 0-5 0 - 5 RBC/hpf   WBC, UA 0-5 0 - 5 WBC/hpf   Bacteria, UA NONE SEEN NONE SEEN   Squamous Epithelial / LPF 0-5 (A) NONE SEEN   ____________________________________________  EKG My interpretation at Time: 14:45   Indication: tachycardia  Rate: 120  Rhythm: sinus tac Axis: normal Other: prolonged Qt, nonspecific ST wave changes. ____________________________________________   ____________________________________________   PROCEDURES  Procedure(s) performed: none    Critical Care performed: no ____________________________________________   INITIAL IMPRESSION / ASSESSMENT AND PLAN / ED COURSE  Pertinent labs & imaging results that were available during my care of the patient were reviewed by me and considered in my medical decision making (see chart for details).  DDX: Osteo-, bilateral, UTI, pneumonia, bacteremia, sepsis, dehydration, electrolyte abnormality  LATORIA DRY is a 70 y.o. who presents to the ED with several weeks of worsening malaise, fatigue and dehydration. Patient arrives afebrile but she is tachycardic and appears markedly dehydration. Her abdominal exam is soft and benign. Urine ordered in triage due to concern for UTI does not show any evidence of acute infection. Patient does have prolonged QT on her EKG as well as nonspecific ST changes that was likely rate dependent.  Laboratory evaluation also drawn in triage is markedly hypokalemia and metabolic acidosis that appears acute. Will replace with by mouth and IV potassium. Will provide IV fluid resuscitation will continue further workup for underlying sepsis though I'm concerned that the patient has developed osteomyelitis.  Clinical  Course  Comment By Time  Patient now feeling much better status post IV fluid resuscitation. Remains hemodynamically stable. I've spoken with radiology on call regarding need for MRI to evaluate for underlying osteomyelitis. Has been with MRI tech who is going to speak with radiology regarding appropriate protocols through the exam. Forcefully the patient does not have an elevated ESR and she is otherwise afebrile. Her white count is down trending from previous. Does not appear overtly septic as her lactic acid is normal. However given her presentation, cachexia and generalized failure to thrive in the setting of that open wound I do feel she will require MRI imaging to further characterize. Unfortunately her inflammatory markers and general picture is otherwise favoring  a subacute process.Merlyn Lot, MD 08/14 1758  Patient reassessed and currently without any tachycardia appears well perfused. Unfortunately she will require a sedated MRI which is unavailable at this facility. Based on the chronicity of her symptoms and symptoms improving with simple IV hydration I do not feel that she requires this MRI on an emergent basis. I recommended admission for further IV hydration and monitoring patient has refused. She is otherwise hemodynamically stable at this time and symptoms have improved with IV hydration and outpatient is tolerating food by mouth I do feel that this is a reasonable approach and is in line with her wishes. Merlyn Lot, MD 08/14 [phone]  Repeat EKG with normalized QT. Tachycardia resolved as well.  Patient remains stable.  Requesting discharge home.  Have discussed with the patient and available family all diagnostics and treatments performed thus far and all questions were answered to the best of my ability. The patient demonstrates understanding and agreement with plan.  Merlyn Lot, MD 08/14 1935     ____________________________________________   FINAL CLINICAL  IMPRESSION(S) / ED DIAGNOSES  Final diagnoses:  Osteomyelitis ([place])  Dehydration  Midline thoracic back pain  Pressure ulcer of back, stage III ([place])      NEW MEDICATIONS STARTED DURING THIS VISIT:  New Prescriptions   No medications on file     Note:  This document was prepared using Dragon voice recognition software and may include unintentional dictation errors.    Merlyn Lot, MD 04/27/16 [phone]

## 2016-04-30 ENCOUNTER — Encounter (HOSPITAL_BASED_OUTPATIENT_CLINIC_OR_DEPARTMENT_OTHER): Payer: Medicare Other | Admitting: General Surgery

## 2016-04-30 DIAGNOSIS — E11621 Type 2 diabetes mellitus with foot ulcer: Secondary | ICD-10-CM | POA: Diagnosis not present

## 2016-04-30 DIAGNOSIS — L89124 Pressure ulcer of left upper back, stage 4: Secondary | ICD-10-CM

## 2016-04-30 NOTE — Progress Notes (Signed)
See I heal 

## 2016-05-03 NOTE — Progress Notes (Signed)
[name], [name] Visit Report for 04/30/2016 Chief Complaint Document Details Patient Name: [name], [name] Date of Service: 04/30/2016 2:15 PM Medical Record Number: [id number] Patient Account Number: [id number] Date of Birth/Sex: 07-21-1946 (70 y.o. Female) Treating RN: Cornell Barman Primary Care Physician: Paulita Cradle Other Clinician: Referring Physician: Paulita Cradle Treating Physician/Extender: Frann Rider in Treatment: 42 Information Obtained from: Patient Chief Complaint Patient presents to the wound care center for a consult due non healing wound. Ulcers on the right elbow and the right heel for about 1 month. Electronic Signature(s) Signed: 04/30/2016 3:26:38 PM By: Judene Companion MD Signed: 05/03/2016 4:08:58 PM By: Christin Fudge MD, FACS Entered By: Judene Companion on 04/30/2016 15:26:37 [name], [name] -------------------------------------------------------------------------------- Debridement Details Patient Name: [name], [name] Date of Service: 04/30/2016 2:15 PM Medical Record Number: [id number] Patient Account Number: [id number] Date of Birth/Sex: 04-04-1946 (70 y.o. Female) Treating RN: Montey Hora Primary Care Physician: Paulita Cradle Other Clinician: Referring Physician: Paulita Cradle Treating Physician/Extender: Frann Rider in Treatment: 59 Debridement Performed for Wound #3 Midline Back Assessment: Performed By: Physician Christin Fudge, MD Debridement: Debridement Pre-procedure Yes Verification/Time Out Taken: Start Time: 15:04 Pain Control: Lidocaine 4% Topical Solution Level: Skin/Subcutaneous Tissue Total Area Debrided (L x 3 (cm) x 3.6 (cm) = 10.8 (cm) W): Tissue and other Viable, Non-Viable, Eschar, Fibrin/Slough, Subcutaneous material debrided: Instrument: Blade, Forceps Bleeding: Minimum Hemostasis Achieved: Silver Nitrate End Time: 15:08 Procedural Pain: 0 Post Procedural Pain:  0 Response to Treatment: Procedure was tolerated well Post Debridement Measurements of Total Wound Length: (cm) 3 Stage: Category/Stage IV Width: (cm) 3.6 Depth: (cm) 0.2 Volume: (cm) 1.696 Post Procedure Diagnosis Same as Pre-procedure Electronic Signature(s) Signed: 04/30/2016 4:59:06 PM By: Montey Hora Signed: 05/03/2016 4:08:58 PM By: Christin Fudge MD, FACS Entered By: Montey Hora on 04/30/2016 15:08:19 [name], [name] -------------------------------------------------------------------------------- HPI Details Patient Name: [name], [name] Date of Service: 04/30/2016 2:15 PM Medical Record Number: [id number] Patient Account Number: [id number] Date of Birth/Sex: 09-Aug-1946 (70 y.o. Female) Treating RN: Cornell Barman Primary Care Physician: Paulita Cradle Other Clinician: Referring Physician: Paulita Cradle Treating Physician/Extender: Frann Rider in Treatment: 54 History of Present Illness Location: Ulceration on the right heel and the right elbow. Quality: Patient reports experiencing a dull pain to affected area(s). Severity: Patient states wound (s) are getting better. Duration: Patient has had the wound for < 4 weeks prior to presenting for treatment Timing: Pain in wound is Intermittent (comes and goes Context: The wound appeared gradually over time Modifying Factors: Consults to this date include:Augmentin and Bactrim and also some heel protection with duoderm Associated Signs and Symptoms: Patient reports having difficulty standing for long periods. HPI Description: 70 year old female with history of peripheral neuropathy, history of diet controlled diabetes mellitus type 2, history of alcoholism here for wound consult sent by her PCP Dr. Sherilyn Cooter. She has pressure ulcers at her right elbow, bilateral heels. Plain films of right calcaneus without acute bony process. Patient started by PCP on Augmentin, Bactrim as per orders, DuoDerm dressings  applied - reports some improvement in her ulcer since last seen. Denies fever, chills, nausea, vomiting, diarrhea. She had a right humerus fracture in the middle of May and has had no surgery and arm is in a sling. She is also been laying in the bed for quite a while. Past medical history significant for essential hypertension, osteoporosis, peripheral neuropathy, alcoholism, ataxia, personal history of breast cancer treated with surgery chemotherapy and radiation and this was done in  December 2010. she is also status post laparoscopic cholecystectomy, pilonidal cyst excision, subcutaneous port placement, partial mastectomy on the left side, skin cancer removal. 03/21/2015 -- she says overall she's been doing better and continues to smoke about 15 cigarettes a day. 03/21/2015 - her orthopedic doctor has said she may require surgery for her right humerus fracture. 04/04/2015 -- her orthopedic surgery has been scheduled for August 11. 04/18/2015 -- she is doing fine as far as her elbow and her right heel goes but she has developed some redness over prominence on her thoracic spine and wanted me to take a look at this. 05/02/2015 -- she had her surgery done and now is in a sling and support. Her back has developed a pressure injury of undetermined stage. She seems to be in better spirits. 05/16/2015 -- last week her right heel was looking great and we had healed it out, but she has not been offloading appropriately and has a deep tissue injury on the right heel again. The area on her right elbow has opened out with slough and the area in the thoracic spine is also getting worse. 05/30/2015 -- she has developed 2 new ulcerations one on her left ischial tuberosity and one on the sacral region. She is still working on getting up smoking but is also unable to take her vitamins as she says she develops a diarrhea when she takes vitamins. She has increased her intake of proteins. 06/06/2015 -- the  patient's husband manages to get her a low air loss mattress with initiating pressure but [name], [name] ([id number]) did not know how to use it exactly and the patient was not happy about using it. Overall she says she's been feeling better. 07/11/2015 -- . Discussed a surgical opinion for debridement and application of a wound VAC, 2 weeks ago but the patient has been reluctant to get a surgical opinion as she wants to avoid surgery. 07/18/2015 -- they have an appointment to see Dr. Tamala Julian this coming Wednesday. 07/29/2015 -- they saw Dr. Tamala Julian in his office and he did a debridement of the wound and removed significant amount of slough. This is in addition to the debridement I had done previously on Friday where a large amount of the eschar was removed. He did not recommend the application of wound VAC. Addendum : Dr. Thompson Caul note was reviewed via EPIC and details noted as above. 08/05/2015 -- over the last week she has developed a another pressure injury to her right hip and has had significant discoloration of the skin and an eschar there. 08/15/2015 -- she is still smoking about a pack of cigarettes a day and does not seem to want to quit. They have not been able to talk to the vendor regarding the air mattress and I will ask them to get in touch again. She is reluctant to take vitamins and does admit her nutrition is poor. 08/22/2015 -- she has been unable to tolerate her vitamins and continues to smoke. They're working on getting a low air loss mattress and have been speaking with the vendor. 09/19/2015 -- since her last visit and was admitted to the hospital between 09/09/2015 and 09/16/2015. She was thought to have an active sepsis possibly from one of her decubitus ulcers but nothing was grown except for an MSSA from her thoracic spine region. CT of this area did not show any osteomyelitis. Been given IV antibiotics which included vancomycin and Zosyn in the hospital under the care of  Dr. [name]  the ID specialist she was sent home on oral Keflex 500 mg 3 times a day for 2 weeks. he will consider an MRI in the outpatient setting to completely rule out osteomyelitis of the spine. 10/03/2015 --readmitted to hospital on 09/23/2015 for general debility, lethargy and possible sepsis and workup was in progress. Seen by Dr. [name] and he would also like a workup for underlying malignancy as sheos had previous ultrasounds of the breasts suggesting findings of concern. Workup done so far does not suggest deep bony infection. She was treated for a pneumonia and received Zosyn and vancomycin. She had been recommended Cipro 500 twice a day and doxycycline 100 mg twice a day once she was to be [place] home. The antibiotics were to be stopped on 10/07/2015. 10/17/2015 -- patient is feeling much better and has been eating better and doing her offloading as much as possible. 10/23/2015 -- she has an appointment to see Dr. [name] tomorrow but other than that has been doing as much as possible with offloading and increasing her diet. 11/07/2015 -- she has not been here to see [place] for 2 weeks and at the present time continues to try and eat better, work on off loading and is using the air mattress at night. She saw her PCP yesterday and she has put her back on a fentanyl patch. 11/21/2015 -- for some strange reason she has been sitting in a chair with her legs dependent for the last 6 days nonstop and has developed massive lower extremity edema and pedal edema with weeping and ulceration. 02/13/2016 -- it's been 3 weeks since I saw her last and overall she's done well except for a right heel where she has had some new ulcerations. 04/16/2016 -- last week I had noted that there was bone protruding in the decubitus ulcer of the thoracic spine area and due to the fact that the patient is extremely labile in her emotional status I had called the patient's husband who came to see me and had  a discussion regarding her care. He has primed her during the week and today she is going to be okay to discuss her diagnosis and treatment options. [name], Izzah [name]. ([id number]) Electronic Signature(s) Signed: 04/30/2016 3:26:48 PM By: Judene Companion MD Signed: 05/03/2016 4:08:58 PM By: Christin Fudge MD, FACS Entered By: Judene Companion on 04/30/2016 15:26:47 [name], [name] -------------------------------------------------------------------------------- Physical Exam Details Patient Name: Deskin, Jareli [name]. Date of Service: 04/30/2016 2:15 PM Medical Record Number: [id number] Patient Account Number: [id number] Date of Birth/Sex: 07/03/1946 (70 y.o. Female) Treating RN: Cornell Barman Primary Care Physician: Paulita Cradle Other Clinician: Referring Physician: Paulita Cradle Treating Physician/Extender: Frann Rider in Treatment: 69 Electronic Signature(s) Signed: 04/30/2016 3:26:55 PM By: Judene Companion MD Signed: 05/03/2016 4:08:58 PM By: Christin Fudge MD, FACS Entered By: Judene Companion on 04/30/2016 15:26:54 Hettinger, [name] Kitchen ([id number]) -------------------------------------------------------------------------------- Physician Orders Details Patient Name: Scouten, Breia [name]. Date of Service: 04/30/2016 2:15 PM Medical Record Number: [id number] Patient Account Number: [id number] Date of Birth/Sex: 01-11-1946 (70 y.o. Female) Treating RN: Montey Hora Primary Care Physician: Paulita Cradle Other Clinician: Referring Physician: Paulita Cradle Treating Physician/Extender: Frann Rider in Treatment: 27 Verbal / Phone Orders: Yes Clinician: Montey Hora Read Back and Verified: Yes Diagnosis Coding Wound Cleansing Wound #11 Right,Distal Calcaneus o Clean wound with Normal Saline. Wound #15 Right,Medial Calcaneus o Clean wound with Normal Saline. Wound #3 Midline Back o Clean wound with Normal Saline. Wound #7 Right Ischial Tuberosity o Clean  wound with  Normal Saline. Anesthetic Wound #11 Right,Distal Calcaneus o Topical Lidocaine 4% cream applied to wound bed prior to debridement - applied in clinic only Wound #15 Right,Medial Calcaneus o Topical Lidocaine 4% cream applied to wound bed prior to debridement - applied in clinic only Wound #3 Midline Back o Topical Lidocaine 4% cream applied to wound bed prior to debridement - applied in clinic only Wound #7 Right Ischial Tuberosity o Topical Lidocaine 4% cream applied to wound bed prior to debridement - applied in clinic only Primary Wound Dressing Wound #11 Right,Distal Calcaneus o Prisma Ag Wound #15 Right,Medial Calcaneus o Prisma Ag Wound #3 Midline Back o Santyl Ointment Wound #7 Right Ischial Tuberosity [name], [name] o Prisma Ag Secondary Dressing Wound #11 Right,Distal Calcaneus o Boardered Foam Dressing Wound #15 Right,Medial Calcaneus o Boardered Foam Dressing Wound #3 Midline Back o Boardered Foam Dressing Wound #7 Right Ischial Tuberosity o Boardered Foam Dressing Dressing Change Frequency Wound #11 Right,Distal Calcaneus o Change dressing every other day. Wound #15 Right,Medial Calcaneus o Change dressing every other day. Wound #3 Midline Back o Change dressing every other day. Wound #7 Right Ischial Tuberosity o Change dressing every other day. Follow-up Appointments Wound #11 Right,Distal Calcaneus o Return Appointment in 1 week. Wound #15 Right,Medial Calcaneus o Return Appointment in 1 week. Wound #3 Midline Back o Return Appointment in 1 week. Wound #7 Right Ischial Tuberosity o Return Appointment in 1 week. Off-Loading Wound #11 Right,Distal Calcaneus o Turn and reposition every 2 hours - Keep pressure off of back and heels Wound #15 Right,Medial Calcaneus o Turn and reposition every 2 hours - Keep pressure off of back and heels [name], [name] Wound #3 Midline  Back o Turn and reposition every 2 hours - Keep pressure off of back and heels Wound #7 Right Ischial Tuberosity o Turn and reposition every 2 hours - Keep pressure off of back and heels Additional Orders / Instructions Wound #11 Right,Distal Calcaneus o Stop Smoking o Increase protein intake. Wound #15 Right,Medial Calcaneus o Stop Smoking o Increase protein intake. Wound #3 Midline Back o Stop Smoking o Increase protein intake. Wound #7 Right Ischial Tuberosity o Stop Smoking o Increase protein intake. Home Health Wound #11 [place] [facility] - [place] Nurse may visit PRN to address patientos wound care needs. o FACE TO FACE ENCOUNTER: MEDICARE and MEDICAID PATIENTS: I certify that this patient is under my care and that I had a face-to-face encounter that meets the physician face-to-face encounter requirements with this patient on this date. The encounter with the patient was in whole or in part for the following MEDICAL CONDITION: (primary reason for [place]) MEDICAL NECESSITY: I certify, that based on my findings, NURSING services are a medically necessary home health service. HOME BOUND STATUS: I certify that my clinical findings support that this patient is homebound (i.e., Due to illness or injury, pt requires aid of supportive devices such as crutches, cane, wheelchairs, walkers, the use of special transportation or the assistance of another person to leave their place of residence. There is a normal inability to leave the home and doing so requires considerable and taxing effort. Other absences are for medical reasons / religious services and are infrequent or of short duration when for other reasons). o If current dressing causes regression in wound condition, may D/C ordered dressing product/s and apply Normal Saline Moist Dressing daily until next [place] / Other  MD appointment. Notify  [facility] of regression in wound condition at [phone]. o Please direct any NON-WOUND related issues/requests for orders to patient's Primary Care Physician Wound #15 [place] [facility] - [place] Nurse may visit PRN to address patientos wound care needs. [place], Jessicaann [name]. ([id number]) o FACE TO FACE ENCOUNTER: MEDICARE and MEDICAID PATIENTS: I certify that this patient is under my care and that I had a face-to-face encounter that meets the physician face-to-face encounter requirements with this patient on this date. The encounter with the patient was in whole or in part for the following MEDICAL CONDITION: (primary reason for [place]) MEDICAL NECESSITY: I certify, that based on my findings, NURSING services are a medically necessary home health service. HOME BOUND STATUS: I certify that my clinical findings support that this patient is homebound (i.e., Due to illness or injury, pt requires aid of supportive devices such as crutches, cane, wheelchairs, walkers, the use of special transportation or the assistance of another person to leave their place of residence. There is a normal inability to leave the home and doing so requires considerable and taxing effort. Other absences are for medical reasons / religious services and are infrequent or of short duration when for other reasons). o If current dressing causes regression in wound condition, may D/C ordered dressing product/s and apply Normal Saline Moist Dressing daily until next [place] / Other MD appointment. [place] of regression in wound condition at [phone]. o Please direct any NON-WOUND related issues/requests for orders to patient's Primary Care Physician Wound #3 Midline Back o [place] [facility] - [place] Nurse may visit PRN to address patientos wound care  needs. o FACE TO FACE ENCOUNTER: MEDICARE and MEDICAID PATIENTS: I certify that this patient is under my care and that I had a face-to-face encounter that meets the physician face-to-face encounter requirements with this patient on this date. The encounter with the patient was in whole or in part for the following MEDICAL CONDITION: (primary reason for [place]) MEDICAL NECESSITY: I certify, that based on my findings, NURSING services are a medically necessary home health service. HOME BOUND STATUS: I certify that my clinical findings support that this patient is homebound (i.e., Due to illness or injury, pt requires aid of supportive devices such as crutches, cane, wheelchairs, walkers, the use of special transportation or the assistance of another person to leave their place of residence. There is a normal inability to leave the home and doing so requires considerable and taxing effort. Other absences are for medical reasons / religious services and are infrequent or of short duration when for other reasons). o If current dressing causes regression in wound condition, may D/C ordered dressing product/s and apply Normal Saline Moist Dressing daily until next [place] / Other MD appointment. [place] of regression in wound condition at [phone]. o Please direct any NON-WOUND related issues/requests for orders to patient's Primary Care Physician Wound #7 Right Ischial [place] [facility] - [place] Nurse may visit PRN to address patientos wound care needs. o FACE TO FACE ENCOUNTER: MEDICARE and MEDICAID PATIENTS: I certify that this patient is under my care and that I had a face-to-face encounter that meets the physician face-to-face encounter requirements with this patient on this date. The encounter with the patient was in whole or in part for the following MEDICAL CONDITION: (primary reason for [place])  MEDICAL NECESSITY: I certify, that based on my findings, NURSING services are a medically necessary home health service. HOME BOUND STATUS: I certify that my clinical findings support that this patient is homebound (i.e., Due to illness or injury, pt requires aid of supportive devices such as crutches, cane, wheelchairs, walkers, the use of special [place], Alexandra [name]. ([id number]) transportation or the assistance of another person to leave their place of residence. There is a normal inability to leave the home and doing so requires considerable and taxing effort. Other absences are for medical reasons / religious services and are infrequent or of short duration when for other reasons). o If current dressing causes regression in wound condition, may D/C ordered dressing product/s and apply Normal Saline Moist Dressing daily until next [place] / Other MD appointment. [place] of regression in wound condition at [phone]. o Please direct any NON-WOUND related issues/requests for orders to patient's Primary Care Physician Electronic Signature(s) Signed: 04/30/2016 4:59:06 PM By: Montey Hora Signed: 05/03/2016 4:08:58 PM By: Christin Fudge MD, FACS Entered By: Montey Hora on 04/30/2016 15:09:27 [place]berg, Madalin [name]. ([id number]) -------------------------------------------------------------------------------- Problem List Details Patient Name: Dellis, Kareen [name]. Date of Service: 04/30/2016 2:15 PM Medical Record Number: [id number] Patient Account Number: [id number] Date of Birth/Sex: 10/11/1945 (70 y.o. 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 [id number]) I89.0 Lymphedema, not elsewhere classified 11/21/2015 11/21/2015 L97.211 Non-pressure chronic ulcer of right calf limited to 11/21/2015 11/21/2015 breakdown of skin Electronic Signature(s) Signed: 04/30/2016 3:26:26 PM By: Judene Companion MD Signed: 05/03/2016 4:08:58 PM By: Christin Fudge MD, FACS Entered By: Judene Companion on 04/30/2016 15:26:25 Harding, Tyleah [name]. ([id number]) -------------------------------------------------------------------------------- Progress Note Details Patient Name: Decoste, Bowie [name]. Date of Service: 04/30/2016 2:15 PM Medical Record Number: [id number] Patient Account Number: [id number] Date of Birth/Sex: 1946/09/06 (70 y.o. Female) Treating RN: Cornell Barman Primary Care Physician: Paulita Cradle Other Clinician: Referring Physician: Paulita Cradle Treating Physician/Extender: Frann Rider in Treatment: 62 Subjective Chief Complaint Information obtained from Patient Patient presents to the wound care center for a consult due non healing wound. Ulcers on the right elbow and the right heel for about 1 month. History of Present Illness (HPI) The following HPI elements were documented for the patient's wound: Location: Ulceration on the right heel and the right elbow. Quality: Patient reports experiencing a dull  pain to affected area(s). Severity: Patient states wound (s) are getting better. Duration: Patient has had the wound for < 4 weeks prior to presenting for treatment Timing: Pain in wound is Intermittent (comes and goes Context: The wound appeared gradually over time Modifying Factors: Consults to this date include:Augmentin and Bactrim and also some heel protection with duoderm Associated Signs and Symptoms: Patient reports having difficulty  standing for long periods. 70 year old female with history of peripheral neuropathy, history of diet controlled diabetes mellitus type 2, history of alcoholism here for wound consult sent by her PCP Dr. Sherilyn Cooter. She has pressure ulcers at her right elbow, bilateral heels. Plain films of right calcaneus without acute bony process. Patient started by PCP on Augmentin, Bactrim as per orders, DuoDerm dressings applied - reports some improvement in her ulcer since last seen. Denies fever, chills, nausea, vomiting, diarrhea. She had a right humerus fracture in the middle of May and has had no surgery and arm is in a sling. She is also been laying in the bed for quite a while. Past medical history significant for essential hypertension, osteoporosis, peripheral neuropathy, alcoholism, ataxia, personal history of breast cancer treated with surgery chemotherapy and radiation and this was done in December 2010. she is also status post laparoscopic cholecystectomy, pilonidal cyst excision, subcutaneous port placement, partial mastectomy on the left side, skin cancer removal. 03/21/2015 -- she says overall she's been doing better and continues to smoke about 15 cigarettes a day. 03/21/2015 - her orthopedic doctor has said she may require surgery for her right humerus fracture. 04/04/2015 -- her orthopedic surgery has been scheduled for August 11. 04/18/2015 -- she is doing fine as far as her elbow and her right heel goes but she has developed some redness over  prominence on her thoracic spine and wanted me to take a look at this. Bouch, Jermiah [name]. ([id number]) 05/02/2015 -- she had her surgery done and now is in a sling and support. Her back has developed a pressure injury of undetermined stage. She seems to be in better spirits. 05/16/2015 -- last week her right heel was looking great and we had healed it out, but she has not been offloading appropriately and has a deep tissue injury on the right heel again. The area on her right elbow has opened out with slough and the area in the thoracic spine is also getting worse. 05/30/2015 -- she has developed 2 new ulcerations one on her left ischial tuberosity and one on the sacral region. She is still working on getting up smoking but is also unable to take her vitamins as she says she develops a diarrhea when she takes vitamins. She has increased her intake of proteins. 06/06/2015 -- the patient's husband manages to get her a low air loss mattress with initiating pressure but did not know how to use it exactly and the patient was not happy about using it. Overall she says she's been feeling better. 07/11/2015 -- . Discussed a surgical opinion for debridement and application of a wound VAC, 2 weeks ago but the patient has been reluctant to get a surgical opinion as she wants to avoid surgery. 07/18/2015 -- they have an appointment to see Dr. Tamala Julian this coming Wednesday. 07/29/2015 -- they saw Dr. Tamala Julian in his office and he did a debridement of the wound and removed significant amount of slough. This is in addition to the debridement I had done previously on Friday where a large amount of the eschar was removed. He did not recommend the application of wound VAC. Addendum : Dr. Thompson Caul note was reviewed via EPIC and details noted as above. 08/05/2015 -- over the last week she has developed a another pressure injury to her right hip and has had significant discoloration of the skin and an eschar  there. 08/15/2015 -- she is still smoking about a pack of cigarettes a day and does not seem  to want to quit. They have not been able to talk to the vendor regarding the air mattress and I will ask them to get in touch again. She is reluctant to take vitamins and does admit her nutrition is poor. 08/22/2015 -- she has been unable to tolerate her vitamins and continues to smoke. They're working on getting a low air loss mattress and have been speaking with the vendor. 09/19/2015 -- since her last visit and was admitted to the hospital between 09/09/2015 and 09/16/2015. She was thought to have an active sepsis possibly from one of her decubitus ulcers but nothing was grown except for an MSSA from her thoracic spine region. CT of this area did not show any osteomyelitis. Been given IV antibiotics which included vancomycin and Zosyn in the hospital under the care of Dr. [name] the ID specialist she was sent home on oral Keflex 500 mg 3 times a day for 2 weeks. he will consider an MRI in the outpatient setting to completely rule out osteomyelitis of the spine. 10/03/2015 --readmitted to hospital on 09/23/2015 for general debility, lethargy and possible sepsis and workup was in progress. Seen by Dr. [name] and he would also like a workup for underlying malignancy as she s had previous ultrasounds of the breasts suggesting findings of concern. Workup done so far does not suggest deep bony infection. She was treated for a pneumonia and received Zosyn and vancomycin. She had been recommended Cipro 500 twice a day and doxycycline 100 mg twice a day once she was to be [place] home. The antibiotics were to be stopped on 10/07/2015. 10/17/2015 -- patient is feeling much better and has been eating better and doing her offloading as much as possible. 10/23/2015 -- she has an appointment to see Dr. [name] tomorrow but other than that has been doing as much as possible with offloading and increasing  her diet. 11/07/2015 -- she has not been here to see [place] for 2 weeks and at the present time continues to try and eat better, work on off loading and is using the air mattress at night. She saw her PCP yesterday and she has put her back on a fentanyl patch. 11/21/2015 -- for some strange reason she has been sitting in a chair with her legs dependent for the last 6 days nonstop and has developed massive lower extremity edema and pedal edema with weeping and ulceration. Mudrick, Marria [name]. ([id number]) 02/13/2016 -- it's been 3 weeks since I saw her last and overall she's done well except for a right heel where she has had some new ulcerations. 04/16/2016 -- last week I had noted that there was bone protruding in the decubitus ulcer of the thoracic spine area and due to the fact that the patient is extremely labile in her emotional status I had called the patient's husband who came to see me and had a discussion regarding her care. He has primed her during the week and today she is going to be okay to discuss her diagnosis and treatment options. Objective Constitutional Vitals Time Taken: 2:34 PM, Height: 65 in, Weight: 100 lbs, BMI: 16.6, Temperature: 97.8 F, Pulse: 117 bpm, Respiratory Rate: 18 breaths/min, Blood Pressure: 103/64 mmHg. Integumentary (Hair, Skin) Wound #11 status is Open. Original cause of wound was Pressure Injury. The wound is located on the Right,Distal Calcaneus. The wound measures 1.4cm length x 1.5cm width x 0.1cm depth; 1.649cm^2 area and 0.165cm^3 volume. The wound is limited to skin breakdown. There is no tunneling or  undermining noted. There is a small amount of serous drainage noted. The wound margin is flat and intact. There is large (67-100%) pink granulation within the wound bed. There is a small (1-33%) amount of necrotic tissue within the wound bed including Adherent Slough. The periwound skin appearance exhibited: Localized Edema, Dry/Scaly. The periwound skin  appearance did not exhibit: Callus, Crepitus, Excoriation, Fluctuance, Friable, Induration, Rash, Scarring, Maceration, Moist, Atrophie Blanche, Cyanosis, Ecchymosis, Hemosiderin Staining, Mottled, Pallor, Rubor, Erythema. Periwound temperature was noted as No Abnormality. The periwound has tenderness on palpation. Wound #15 status is Open. Original cause of wound was Pressure Injury. The wound is located on the Right,Medial Calcaneus. The wound measures 0.6cm length x 0.8cm width x 0.2cm depth; 0.377cm^2 area and 0.075cm^3 volume. The wound is limited to skin breakdown. There is no tunneling or undermining noted. There is a small amount of serous drainage noted. The wound margin is flat and intact. There is large (67-100%) red granulation within the wound bed. There is no necrotic tissue within the wound bed. The periwound skin appearance did not exhibit: Callus, Crepitus, Excoriation, Fluctuance, Friable, Induration, Localized Edema, Rash, Scarring, Dry/Scaly, Maceration, Moist, Atrophie Blanche, Cyanosis, Ecchymosis, Hemosiderin Staining, Mottled, Pallor, Rubor, Erythema. Periwound temperature was noted as No Abnormality. The periwound has tenderness on palpation. Wound #3 status is Open. Original cause of wound was Pressure Injury. The wound is located on the Midline Back. The wound measures 3cm length x 3.6cm width x 0.2cm depth; 8.482cm^2 area and 1.696cm^3 volume. There is bone exposed. There is no tunneling or undermining noted. There is a medium amount of serous drainage noted. The wound margin is flat and intact. There is large (67-100%) red, pink granulation within the wound bed. There is a small (1-33%) amount of necrotic tissue within the wound bed including Adherent Slough. The periwound skin appearance exhibited: Erythema. The periwound skin appearance did not exhibit: Callus, Crepitus, Excoriation, Fluctuance, Friable, Induration, Localized Edema, Rash, Scarring, Dry/Scaly,  Maceration, Moist, Atrophie Blanche, Cyanosis, Ecchymosis, Hemosiderin Israelson, Vienne [name]. ([id number]) Staining, Mottled, Pallor, Rubor. The surrounding wound skin color is noted with erythema which is circumferential. Periwound temperature was noted as No Abnormality. The periwound has tenderness on palpation. Wound #7 status is Open. Original cause of wound was Pressure Injury. The wound is located on the Right Ischial Tuberosity. The wound measures 0.1cm length x 0.4cm width x 0.6cm depth; 0.031cm^2 area and 0.019cm^3 volume. The wound is limited to skin breakdown. There is no tunneling or undermining noted. There is a medium amount of serous drainage noted. The wound margin is flat and intact. There is large (67-100%) pink granulation within the wound bed. There is no necrotic tissue within the wound bed. The periwound skin appearance exhibited: Moist. The periwound skin appearance did not exhibit: Callus, Crepitus, Excoriation, Fluctuance, Friable, Induration, Localized Edema, Rash, Scarring, Dry/Scaly, Maceration, Atrophie Blanche, Cyanosis, Ecchymosis, Hemosiderin Staining, Mottled, Pallor, Rubor, Erythema. Periwound temperature was noted as No Abnormality. The periwound has tenderness on palpation. 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 There was a Skin/Subcutaneous Tissue Debridement [id number]) debridement with total area of 10.8 sq cm performed by Christin Fudge, MD. with the following instrument(s): Blade  and Forceps to remove Viable and Non-Viable tissue/material including Fibrin/Slough, Eschar, and Subcutaneous after  achieving pain control using Lidocaine 4% Topical Solution. A time out was conducted prior to the start of the procedure. A Minimum amount of bleeding was controlled with Silver Nitrate. The procedure was tolerated well with a pain level of 0 throughout and a pain level of 0 following the procedure. Post Debridement Measurements: 3cm length x 3.6cm width x 0.2cm depth; 1.696cm^3 volume. Post debridement Stage noted as Category/Stage IV. Post procedure Diagnosis Wound #3: Same as Pre-Procedure Canepa, Kashara [name]. ([id number]) Debrided necrotic material of back. Wound 4 cm size. Her Dr. is considering MRI tosee if osteo present. Also pressure ulcers feet and right hip which are very small. I encuraged her toeat more as she is very thin and has lost 10 more lbs. Treat back with Santyl and feet with Prisma Plan Wound Cleansing: Wound #11 Right,Distal Calcaneus: Clean wound with Normal Saline. Wound #15 Right,Medial Calcaneus: Clean wound with Normal Saline. Wound #3 Midline Back: Clean wound with Normal Saline. Wound #7 Right Ischial Tuberosity: Clean wound with Normal Saline. Anesthetic: Wound #11 Right,Distal Calcaneus: Topical Lidocaine 4% cream applied to wound bed prior to debridement - applied in clinic only Wound #15 Right,Medial Calcaneus: Topical Lidocaine 4% cream applied to wound bed prior to debridement - applied in clinic only Wound #3 Midline Back: Topical Lidocaine 4% cream applied to wound bed prior to debridement - applied in clinic only Wound #7 Right Ischial Tuberosity: Topical Lidocaine 4% cream applied to wound bed prior to debridement - applied in clinic only Primary Wound Dressing: Wound #11 Right,Distal Calcaneus: Prisma Ag Wound #15 Right,Medial Calcaneus: Prisma Ag Wound #3 Midline Back: Santyl Ointment Wound #7 Right Ischial Tuberosity: Prisma Ag Secondary Dressing: Wound #11 Right,Distal Calcaneus: Boardered Foam Dressing Wound #15 Right,Medial  Calcaneus: Boardered Foam Dressing Wound #3 Midline Back: Boardered Foam Dressing Wound #7 Right Ischial Tuberosity: Boardered Foam Dressing Dressing Change Frequency: Wound #11 Right,Distal Calcaneus: Change dressing every other day. Wound #15 Right,Medial Calcaneus: Kowalski, Samaria [name]. ([id number]) Change dressing every other day. Wound #3 Midline Back: Change dressing every other day. Wound #7 Right Ischial Tuberosity: Change dressing every other day. Follow-up Appointments: Wound #11 Right,Distal Calcaneus: Return Appointment in 1 week. Wound #15 Right,Medial Calcaneus: Return Appointment in 1 week. Wound #3 Midline Back: Return Appointment in 1 week. Wound #7 Right Ischial Tuberosity: Return Appointment in 1 week. Off-Loading: Wound #11 Right,Distal Calcaneus: Turn and reposition every 2 hours - Keep pressure off of back and heels Wound #15 Right,Medial Calcaneus: Turn and reposition every 2 hours - Keep pressure off of back and heels Wound #3 Midline Back: Turn and reposition every 2 hours - Keep pressure off of back and heels Wound #7 Right Ischial Tuberosity: Turn and reposition every 2 hours - Keep pressure off of back and heels Additional Orders / Instructions: Wound #11 Right,Distal Calcaneus: Stop Smoking Increase protein intake. Wound #15 Right,Medial Calcaneus: Stop Smoking Increase protein intake. Wound #3 Midline Back: Stop Smoking Increase protein intake. Wound #7 Right Ischial Tuberosity: Stop Smoking Increase protein intake. Home Health: Wound #11 Right,Distal Calcaneus: Continue Home Health Visits - [facility] Nurse may visit PRN to address patient s wound care needs. FACE TO FACE ENCOUNTER: MEDICARE and MEDICAID PATIENTS: I certify that this patient is under my care and that I had a face-to-face encounter that meets the physician face-to-face encounter requirements with this patient on this date. The encounter with the patient was in  whole  or in part for the following MEDICAL CONDITION: (primary reason for Home Healthcare) MEDICAL NECESSITY: I certify, that based on my findings, NURSING services are a medically necessary home health service. HOME BOUND STATUS: I certify that my clinical findings support that this patient is homebound (i.e., Due to illness or injury, pt requires aid of supportive devices such as crutches, cane, wheelchairs, walkers, the use of special transportation or the assistance of another person to leave their place of residence. There is a normal inability to leave the home and doing so requires considerable and taxing effort. Other absences are for medical reasons / religious services and are infrequent or of short duration when for other reasons). If current dressing causes regression in wound condition, may D/C ordered dressing product/s and apply Broussard, Gethsemane [name]. ([id number]) Normal Saline Moist Dressing daily until next [place] / Other MD appointment. [place] of regression in wound condition at [phone]. Please direct any NON-WOUND related issues/requests for orders to patient's Primary Care Physician Wound #15 Right,Medial Calcaneus: [place] Visits - [facility] Nurse may visit PRN to address patient s wound care needs. FACE TO FACE ENCOUNTER: MEDICARE and MEDICAID PATIENTS: I certify that this patient is under my care and that I had a face-to-face encounter that meets the physician face-to-face encounter requirements with this patient on this date. The encounter with the patient was in whole or in part for the following MEDICAL CONDITION: (primary reason for [place]) MEDICAL NECESSITY: I certify, that based on my findings, NURSING services are a medically necessary home health service. HOME BOUND STATUS: I certify that my clinical findings support that this patient is homebound (i.e., Due to illness or injury, pt requires aid of  supportive devices such as crutches, cane, wheelchairs, walkers, the use of special transportation or the assistance of another person to leave their place of residence. There is a normal inability to leave the home and doing so requires considerable and taxing effort. Other absences are for medical reasons / religious services and are infrequent or of short duration when for other reasons). If current dressing causes regression in wound condition, may D/C ordered dressing product/s and apply Normal Saline Moist Dressing daily until next [place] / Other MD appointment. [place] of regression in wound condition at [phone]. Please direct any NON-WOUND related issues/requests for orders to patient's Primary Care Physician Wound #3 Midline Back: [place] Visits - [facility] Nurse may visit PRN to address patient s wound care needs. FACE TO FACE ENCOUNTER: MEDICARE and MEDICAID PATIENTS: I certify that this patient is under my care and that I had a face-to-face encounter that meets the physician face-to-face encounter requirements with this patient on this date. The encounter with the patient was in whole or in part for the following MEDICAL CONDITION: (primary reason for [place]) MEDICAL NECESSITY: I certify, that based on my findings, NURSING services are a medically necessary home health service. HOME BOUND STATUS: I certify that my clinical findings support that this patient is homebound (i.e., Due to illness or injury, pt requires aid of supportive devices such as crutches, cane, wheelchairs, walkers, the use of special transportation or the assistance of another person to leave their place of residence. There is a normal inability to leave the home and doing so requires considerable and taxing effort. Other absences are for medical reasons / religious services and are infrequent or of short duration when  for other reasons). If  current dressing causes regression in wound condition, may D/C ordered dressing product/s and apply Normal Saline Moist Dressing daily until next [place] / Other MD appointment. [place] of regression in wound condition at [phone]. Please direct any NON-WOUND related issues/requests for orders to patient's Primary Care Physician Wound #7 Right Ischial Tuberosity: [place] Visits - [facility] Nurse may visit PRN to address patient s wound care needs. FACE TO FACE ENCOUNTER: MEDICARE and MEDICAID PATIENTS: I certify that this patient is under my care and that I had a face-to-face encounter that meets the physician face-to-face encounter requirements with this patient on this date. The encounter with the patient was in whole or in part for the following MEDICAL CONDITION: (primary reason for [place]) MEDICAL NECESSITY: I certify, that based on my findings, NURSING services are a medically necessary home health service. HOME BOUND STATUS: I certify that my clinical findings support that this patient is homebound (i.e., Due to illness or injury, pt requires aid of supportive devices such as crutches, cane, wheelchairs, walkers, the use of special transportation or the assistance of another person to leave their place of residence. There is a normal inability to leave the home and doing so requires considerable and taxing effort. Other absences are for medical reasons / religious services and are infrequent or of short duration when for other reasons). If current dressing causes regression in wound condition, may D/C ordered dressing product/s and apply Archibeque, Lumi [name]. ([id number]) Normal Saline Moist Dressing daily until next [place] / Other MD appointment. [place] of regression in wound condition at [phone]. Please direct any NON-WOUND related issues/requests for orders to patient's Primary Care  Physician Follow-Up Appointments: A follow-up appointment should be scheduled. Medication Reconciliation completed and provided to Patient/Care Provider. A Patient Clinical Summary of Care was provided to AS Electronic Signature(s) Signed: 04/30/2016 3:31:49 PM By: Judene Companion MD Signed: 05/03/2016 4:08:58 PM By: Christin Fudge MD, FACS Entered By: Judene Companion on 04/30/2016 15:31:49 Demartini, Arianny [name]. ([id number]) -------------------------------------------------------------------------------- SuperBill Details Patient Name: Hardigree, Taleyah [name]. Date of Service: 04/30/2016 Medical Record Number: [id number] Patient Account Number: [id number] Date of Birth/Sex: 1945-10-07 (70 y.o. 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, stage 3 L89.613 Pressure ulcer of right heel, stage 3 Facility Procedures CPT4 Code: [id number] Description: [id number] - DEB SUBQ TISSUE 20 SQ CM/< ICD-10 Description Diagnosis E11.621 Type 2 diabetes mellitus with foot ulcer Modifier: Quantity: 1 Physician Procedures CPT4 Code: [id number] Description: [id number] - WC PHYS LEVEL 3 - EST PT ICD-10 Description Diagnosis L89.100 Pressure ulcer of unspecified part of back, uns Modifier: tageable Quantity: 1 CPT4 Code: [id number] Description: [id number] - WC PHYS SUBQ TISS 20 SQ CM ICD-10 Description Diagnosis E11.621 Type 2 diabetes mellitus with foot ulcer Modifier: Quantity: 1 Electronic Signature(s) Signed: 04/30/2016 3:32:17 PM By: Judene Companion MD Signed: 05/03/2016 4:08:58 PM By: Christin Fudge MD, FACS Entered  By: Judene Companion on 04/30/2016 15:32:17

## 2016-05-05 NOTE — Progress Notes (Signed)
ARREDONDO, Carie H. ([id number]) Visit Report for 04/30/2016 Arrival Information Details Patient Name: MULHERN, Elycia H. Date of Service: 04/30/2016 2:15 PM Medical Record Number: [id number] Patient Account Number: [id number] Date of Birth/Sex: Dec 24, 1945 (70 y.o. Female) Treating RN: Cornell Barman Primary Care Physician: Paulita Cradle Other Clinician: Referring Physician: Paulita Cradle Treating Physician/Extender: Frann Rider in Treatment: 74 Visit Information History Since Last Visit Added or deleted any medications: No Patient Arrived: Wheel Chair Any new allergies or adverse reactions: No Arrival Time: 14:27 Had a fall or experienced change in No activities of daily living that may affect Accompanied By: husband risk of falls: Transfer Assistance: Manual Signs or symptoms of abuse/neglect since last No Patient Identification Verified: Yes visito Secondary Verification Process Yes Hospitalized since last visit: No Completed: Has Dressing in Place as Prescribed: Yes Patient Requires Transmission-Based No Pain Present Now: Yes Precautions: Patient Has Alerts: No Electronic Signature(s) Signed: 05/05/2016 10:00:43 AM By: Gretta Cool, RN, BSN, Kim RN, BSN Entered By: Gretta Cool, RN, BSN, Kim on 04/30/2016 14:33:34 Perleberg, Edy H. ([id number]) -------------------------------------------------------------------------------- Encounter Discharge Information Details Patient Name: [name], Navika H. Date of Service: 04/30/2016 2:15 PM Medical Record Number: [id number] Patient Account Number: [id number] Date of Birth/Sex: 02-06-1946 (70 y.o. Female) Treating RN: Cornell Barman Primary Care Physician: Paulita Cradle Other Clinician: Referring Physician: Paulita Cradle Treating Physician/Extender: Frann Rider in Treatment: 31 Encounter Discharge Information Items Discharge Pain Level: 0 Discharge Condition: Stable Ambulatory Status: Wheelchair Discharge Destination:  Home Transportation: Private Auto Accompanied By: husband Schedule Follow-up Appointment: Yes Medication Reconciliation completed and provided to Patient/Care Yes Alante Tolan: Provided on Clinical Summary of Care: 04/30/2016 Form Type Recipient Paper Patient AS Electronic Signature(s) Signed: 05/05/2016 10:00:43 AM By: Gretta Cool RN, BSN, Kim RN, BSN Previous Signature: 04/30/2016 3:23:03 PM Version By: Sharon Mt Entered By: Gretta Cool RN, BSN, Kim on 04/30/2016 15:24:47 Wyka, Sherle H. ([id number]) -------------------------------------------------------------------------------- Lower Extremity Assessment Details Patient Name: Radabaugh, Kiva H. Date of Service: 04/30/2016 2:15 PM Medical Record Number: [id number] Patient Account Number: [id number] Date of Birth/Sex: Jun 04, 1946 (70 y.o. Female) Treating RN: Cornell Barman Primary Care Physician: Paulita Cradle Other Clinician: Referring Physician: Paulita Cradle Treating Physician/Extender: Frann Rider in Treatment: 64 Vascular Assessment Pulses: Posterior Tibial Dorsalis Pedis Palpable: [Left:Yes] [Right:Yes] Toe Nail Assessment Left: Right: Thick: Yes Yes Discolored: Yes Yes Deformed: Yes Yes Improper Length and Hygiene: Yes Yes Electronic Signature(s) Signed: 05/05/2016 10:00:43 AM By: Gretta Cool, RN, BSN, Kim RN, BSN Entered By: Gretta Cool, RN, BSN, Kim on 04/30/2016 14:45:36 Navarrette, Mylia H. ([id number]) -------------------------------------------------------------------------------- Multi Wound Chart Details Patient Name: [name], Ori H. Date of Service: 04/30/2016 2:15 PM Medical Record Number: [id number] Patient Account Number: [id number] Date of Birth/Sex: 07-02-1946 (70 y.o. Female) Treating RN: Montey Hora Primary Care Physician: Paulita Cradle Other Clinician: Referring Physician: Paulita Cradle Treating Physician/Extender: Frann Rider in Treatment: 35 Vital Signs Height(in): 65 Pulse(bpm):  117 Weight(lbs): 100 Blood Pressure 103/64 (mmHg): Body Mass Index(BMI): 17 Temperature(F): 97.8 Respiratory Rate 18 (breaths/min): Photos: Wound Location: Right Calcaneus - Distal Right Calcaneus - Medial Back - Midline Wounding Event: Pressure Injury Pressure Injury Pressure Injury Primary Etiology: Pressure Ulcer Pressure Ulcer Pressure Ulcer Comorbid History: Cataracts, Asthma, Cataracts, Asthma, Cataracts, Asthma, Hypertension, Type II Hypertension, Type II Hypertension, Type II Diabetes, Neuropathy, Diabetes, Neuropathy, Diabetes, Neuropathy, Received Chemotherapy Received Chemotherapy Received Chemotherapy Date Acquired: 09/29/2015 02/18/2016 05/02/2015 Weeks of Treatment: 30 9 52 Wound Status: Open Open Open Clustered Wound: Yes No No Measurements L x W x D 1.4x1.5x0.1 0.6x0.8x0.2 3x3.6x0.2 (cm) Area (cm) : 1.649 0.377  8.482 Volume (cm) : 0.165 0.075 1.696 % Reduction in Area: -133.20% -498.40% -3755.50% % Reduction in Volume: -132.40% -1150.00% -7609.10% Classification: Category/Stage II Category/Stage II Category/Stage IV HBO Classification: Grade 1 Grade 1 N/A Exudate Amount: Small Small Medium Exudate Type: Serous Serous Serous Exudate Color: amber amber amber Wound Margin: Flat and Intact Flat and Intact Flat and Intact Rackers, Brityn H. ([id number]) Granulation Amount: Large (67-100%) Large (67-100%) Large (67-100%) Granulation Quality: Pink, Hyper-granulation Red Red, Pink Necrotic Amount: Small (1-33%) None Present (0%) Small (1-33%) Exposed Structures: Fascia: No Fascia: No Bone: Yes Fat: No Fat: No Fascia: No Tendon: No Tendon: No Fat: No Muscle: No Muscle: No Tendon: No Joint: No Joint: No Muscle: No Bone: No Bone: No Joint: No Limited to Skin Limited to Skin Breakdown Breakdown Epithelialization: None None None Periwound Skin Texture: Edema: Yes Edema: No Edema: No Excoriation: No Excoriation: No Excoriation: No Induration:  No Induration: No Induration: No Callus: No Callus: No Callus: No Crepitus: No Crepitus: No Crepitus: No Fluctuance: No Fluctuance: No Fluctuance: No Friable: No Friable: No Friable: No Rash: No Rash: No Rash: No Scarring: No Scarring: No Scarring: No Periwound Skin Dry/Scaly: Yes Maceration: No Maceration: No Moisture: Maceration: No Moist: No Moist: No Moist: No Dry/Scaly: No Dry/Scaly: No Periwound Skin Color: Atrophie Blanche: No Atrophie Blanche: No Erythema: Yes Cyanosis: No Cyanosis: No Atrophie Blanche: No Ecchymosis: No Ecchymosis: No Cyanosis: No Erythema: No Erythema: No Ecchymosis: No Hemosiderin Staining: No Hemosiderin Staining: No Hemosiderin Staining: No Mottled: No Mottled: No Mottled: No Pallor: No Pallor: No Pallor: No Rubor: No Rubor: No Rubor: No Erythema Location: N/A N/A Circumferential Temperature: No Abnormality No Abnormality No Abnormality Tenderness on Yes Yes Yes Palpation: Wound Preparation: Ulcer Cleansing: Ulcer Cleansing: Ulcer Cleansing: Rinsed/Irrigated with Rinsed/Irrigated with Rinsed/Irrigated with Saline Saline Saline Topical Anesthetic Topical Anesthetic Topical Anesthetic Applied: Other: lidocaine Applied: Other: lidocaine Applied: Other: lidocaine 4% 4% 4% Wound Number: 7 N/A N/A Photos: N/A N/A Pitre, Kyleeann H. ([id number]) Wound Location: Right Ischial Tuberosity N/A N/A Wounding Event: Pressure Injury N/A N/A Primary Etiology: Pressure Ulcer N/A N/A Comorbid History: Cataracts, Asthma, N/A N/A Hypertension, Type II Diabetes, Neuropathy, Received Chemotherapy Date Acquired: 08/01/2015 N/A N/A Weeks of Treatment: 38 N/A N/A Wound Status: Open N/A N/A Clustered Wound: No N/A N/A Measurements L x W x D 0.1x0.4x0.6 N/A N/A (cm) Area (cm) : 0.031 N/A N/A Volume (cm) : 0.019 N/A N/A % Reduction in Area: 99.00% N/A N/A % Reduction in Volume: 93.90% N/A N/A Classification: Category/Stage III N/A  N/A HBO Classification: N/A N/A N/A Exudate Amount: Medium N/A N/A Exudate Type: Serous N/A N/A Exudate Color: amber N/A N/A Wound Margin: Flat and Intact N/A N/A Granulation Amount: Large (67-100%) N/A N/A Granulation Quality: Pink N/A N/A Necrotic Amount: None Present (0%) N/A N/A Exposed Structures: Fascia: No N/A N/A Fat: No Tendon: No Muscle: No Joint: No Bone: No Limited to Skin Breakdown Epithelialization: None N/A N/A Periwound Skin Texture: Edema: No N/A N/A Excoriation: No Induration: No Callus: No Crepitus: No Fluctuance: No Friable: No Rash: No Scarring: No Periwound Skin Moist: Yes N/A N/A Moisture: Maceration: No Dry/Scaly: No Periwound Skin Color: N/A N/A Delany, Panhia H. ([id number]) Atrophie Blanche: No Cyanosis: No Ecchymosis: No Erythema: No Hemosiderin Staining: No Mottled: No Pallor: No Rubor: No Erythema Location: N/A N/A N/A Temperature: No Abnormality N/A N/A Tenderness on Yes N/A N/A Palpation: Wound Preparation: Ulcer Cleansing: N/A N/A Rinsed/Irrigated with Saline Topical Anesthetic Applied: Other: lidocaine 4% Treatment Notes Electronic Signature(s)  Signed: 04/30/2016 4:59:06 PM By: Montey Hora Entered By: Montey Hora on 04/30/2016 15:02:33 Holstad, Nadia Lemmie Evens ([id number]) -------------------------------------------------------------------------------- Multi-Disciplinary Care Plan Details Patient Name: [name], Tenille H. Date of Service: 04/30/2016 2:15 PM Medical Record Number: [id number] Patient Account Number: [id number] Date of Birth/Sex: [birth year] (70 y.o. Female) Treating RN: Montey Hora Primary Care Physician: Paulita Cradle Other Clinician: Referring Physician: Paulita Cradle Treating Physician/Extender: Frann Rider in Treatment: 55 Active Inactive Abuse / Safety / Falls / Self Care Management Nursing Diagnoses: Impaired home maintenance Impaired physical mobility Knowledge deficit related to:  safety; personal, health (wound), emergency Potential for falls Self care deficit: actual or potential Goals: Patient will remain injury free Date Initiated: 03/13/2015 Goal Status: Active Patient/caregiver will verbalize understanding of skin care regimen Date Initiated: 03/13/2015 Goal Status: Active Patient/caregiver will verbalize/demonstrate measure taken to improve self care Date Initiated: 03/13/2015 Goal Status: Active Patient/caregiver will verbalize/demonstrate measures taken to improve the patient's personal safety Date Initiated: 03/13/2015 Goal Status: Active Patient/caregiver will verbalize/demonstrate measures taken to prevent injury and/or falls Date Initiated: 03/13/2015 Goal Status: Active Patient/caregiver will verbalize/demonstrate understanding of what to do in case of emergency Date Initiated: 03/13/2015 Goal Status: Active Interventions: Assess fall risk on admission and as needed Assess: immobility, friction, shearing, incontinence upon admission and as needed Assess impairment of mobility on admission and as needed per policy Assess self care needs on admission and as needed Provide education on basic hygiene Cockerell, [place]. ([id number]) Provide education on fall prevention Provide education on personal and home safety Provide education on safe transfers Treatment Activities: Education provided on Basic Hygiene : 10/23/2015 Notes: Pressure Nursing Diagnoses: Knowledge deficit related to causes and risk factors for pressure ulcer development Knowledge deficit related to management of pressures ulcers Potential for impaired tissue integrity related to pressure, friction, moisture, and shear Goals: Patient will remain free from development of additional pressure ulcers Date Initiated: 03/13/2015 Goal Status: Active Patient will remain free of pressure ulcers Date Initiated: 03/13/2015 Goal Status: Active Patient/caregiver will verbalize risk factors for  pressure ulcer development Date Initiated: 03/13/2015 Goal Status: Active Patient/caregiver will verbalize understanding of pressure ulcer management Date Initiated: 03/13/2015 Goal Status: Active Interventions: Assess: immobility, friction, shearing, incontinence upon admission and as needed Assess offloading mechanisms upon admission and as needed Assess potential for pressure ulcer upon admission and as needed Provide education on pressure ulcers Treatment Activities: Patient referred for home evaluation of offloading devices/mattresses : 01/02/2016 Patient referred for pressure reduction/relief devices : 01/02/2016 Patient referred for seating evaluation to ensure proper offloading : 01/02/2016 Pressure reduction/relief device ordered : 01/02/2016 Test ordered outside of clinic : 01/02/2016 Notes: Wound/Skin Impairment Jezek, Halen H. ([id number]) Nursing Diagnoses: Impaired tissue integrity Knowledge deficit related to ulceration/compromised skin integrity Goals: Patient/caregiver will verbalize understanding of skin care regimen Date Initiated: 03/13/2015 Goal Status: Active Ulcer/skin breakdown will have a volume reduction of 30% by week 4 Date Initiated: 03/13/2015 Goal Status: Active Ulcer/skin breakdown will have a volume reduction of 50% by week 8 Date Initiated: 03/13/2015 Goal Status: Active Ulcer/skin breakdown will have a volume reduction of 80% by week 12 Date Initiated: 03/13/2015 Goal Status: Active Ulcer/skin breakdown will heal within 14 weeks Date Initiated: 03/13/2015 Goal Status: Active Interventions: Assess patient/caregiver ability to obtain necessary supplies Assess patient/caregiver ability to perform ulcer/skin care regimen upon admission and as needed Assess ulceration(s) every visit Provide education on smoking Provide education on ulcer and skin care Treatment Activities: Patient referred to home care : 01/02/2016 Referred to DME Lillyana Majette  for dressing  supplies : 01/02/2016 Skin care regimen initiated : 01/02/2016 Topical wound management initiated : 01/02/2016 Notes: Electronic Signature(s) Signed: 04/30/2016 4:59:06 PM By: Montey Hora Entered By: Montey Hora on 04/30/2016 15:02:21 Branton, Jonnette H. ([id number]) -------------------------------------------------------------------------------- Pain Assessment Details Patient Name: [name], Willo H. Date of Service: 04/30/2016 2:15 PM Medical Record Number: [id number] Patient Account Number: [id number] Date of Birth/Sex: [birth year] (70 y.o. Female) Treating RN: Cornell Barman Primary Care Physician: Paulita Cradle Other Clinician: Referring Physician: Paulita Cradle Treating Physician/Extender: Frann Rider in Treatment: 75 Active Problems Location of Pain Severity and Description of Pain Patient Has Paino Yes Site Locations Pain Location: Generalized Pain, Pain in Ulcers With Dressing Change: Yes Duration of the Pain. Constant / Intermittento Constant Pain Management and Medication Current Pain Management: Notes Topical or injectable lidocaine is offered to patient for acute pain when surgical debridement is performed. If needed, Patient is instructed to use over the counter pain medication for the following 24-48 hours after debridement. Wound care MDs do not prescribed pain medications. Patient has chronic pain or uncontrolled pain. Patient has been instructed to make an appointment with their Primary Care Physician for pain management. Electronic Signature(s) Signed: 05/05/2016 10:00:43 AM By: Gretta Cool, RN, BSN, Kim RN, BSN Entered By: Gretta Cool, RN, BSN, Kim on 04/30/2016 14:33:58 Fredericksen, Daila HMarland Kitchen ([id number]) -------------------------------------------------------------------------------- Patient/Caregiver Education Details Patient Name: [name], Dazhane H. Date of Service: 04/30/2016 2:15 PM Medical Record Number: [id number] Patient Account Number: [id number] Date of  Birth/Gender: 11-Jul-1946 (70 y.o. Female) Treating RN: Cornell Barman Primary Care Physician: Paulita Cradle Other Clinician: Referring Physician: Paulita Cradle Treating Physician/Extender: Frann Rider in Treatment: 67 Education Assessment Education Provided To: Caregiver Education Topics Provided Pressure: Handouts: Pressure Ulcers: Care and Offloading Methods: Demonstration, Explain/Verbal Responses: State content correctly Electronic Signature(s) Signed: 05/05/2016 10:00:43 AM By: Gretta Cool, RN, BSN, Kim RN, BSN Entered By: Gretta Cool, RN, BSN, Kim on 04/30/2016 15:25:04 Saldierna, Kajsa H. ([id number]) -------------------------------------------------------------------------------- Wound Assessment Details Patient Name: Mende, Caterin H. Date of Service: 04/30/2016 2:15 PM Medical Record Number: [id number] Patient Account Number: [id number] Date of Birth/Sex: 1946-06-17 (70 y.o. Female) Treating RN: Cornell Barman Primary Care Physician: Paulita Cradle Other Clinician: Referring Physician: Paulita Cradle Treating Physician/Extender: Frann Rider in Treatment: 66 Wound Status Wound Number: 11 Primary Pressure Ulcer Etiology: Wound Location: Right Calcaneus - Distal Wound Open Wounding Event: Pressure Injury Status: Date Acquired: 09/29/2015 Comorbid Cataracts, Asthma, Hypertension, Type Weeks Of Treatment: 30 History: II Diabetes, Neuropathy, Received Clustered Wound: Yes Chemotherapy Photos Wound Measurements Length: (cm) 1.4 Width: (cm) 1.5 Depth: (cm) 0.1 Area: (cm) 1.649 Volume: (cm) 0.165 % Reduction in Area: -133.2% % Reduction in Volume: -132.4% Epithelialization: None Tunneling: No Undermining: No Wound Description Classification: Category/Stage II Diabetic Severity (Wagner): Grade 1 Wound Margin: Flat and Intact Exudate Amount: Small Exudate Type: Serous Exudate Color: amber Foul Odor After Cleansing: No Wound Bed Granulation Amount:  Large (67-100%) Exposed Structure Granulation Quality: Pink, Hyper-granulation Fascia Exposed: No Necrotic Amount: Small (1-33%) Fat Layer Exposed: No Necrotic Quality: Adherent Slough Tendon Exposed: No Gillin, Maripaz H. ([id number]) Muscle Exposed: No Joint Exposed: No Bone Exposed: No Limited to Skin Breakdown Periwound Skin Texture Texture Color No Abnormalities Noted: No No Abnormalities Noted: No Callus: No Atrophie Blanche: No Crepitus: No Cyanosis: No Excoriation: No Ecchymosis: No Fluctuance: No Erythema: No Friable: No Hemosiderin Staining: No Induration: No Mottled: No Localized Edema: Yes Pallor: No Rash: No Rubor: No Scarring: No Temperature / Pain Moisture Temperature: No Abnormality No Abnormalities Noted: No Tenderness on  Palpation: Yes Dry / Scaly: Yes Maceration: No Moist: No Wound Preparation Ulcer Cleansing: Rinsed/Irrigated with Saline Topical Anesthetic Applied: Other: lidocaine 4%, Treatment Notes Wound #11 (Right, Distal Calcaneus) 1. Cleansed with: Clean wound with Normal Saline 2. Anesthetic Topical Lidocaine 4% cream to wound bed prior to debridement 4. Dressing Applied: Prisma Ag 5. Secondary Dressing Applied Bordered Foam Dressing Electronic Signature(s) Signed: 05/05/2016 10:00:43 AM By: Gretta Cool, RN, BSN, Kim RN, BSN Entered By: Gretta Cool, RN, BSN, Kim on 04/30/2016 14:49:12 Driggs, [place]. ([id number]) -------------------------------------------------------------------------------- Wound Assessment Details Patient Name: Crandle, Megha H. Date of Service: 04/30/2016 2:15 PM Medical Record Number: [id number] Patient Account Number: [id number] Date of Birth/Sex: April 04, 1946 (70 y.o. Female) Treating RN: Cornell Barman Primary Care Physician: Paulita Cradle Other Clinician: Referring Physician: Paulita Cradle Treating Physician/Extender: Frann Rider in Treatment: 48 Wound Status Wound Number: 15 Primary Pressure  Ulcer Etiology: Wound Location: Right Calcaneus - Medial Wound Open Wounding Event: Pressure Injury Status: Date Acquired: 02/18/2016 Comorbid Cataracts, Asthma, Hypertension, Type Weeks Of Treatment: 9 History: II Diabetes, Neuropathy, Received Clustered Wound: No Chemotherapy Photos Wound Measurements Length: (cm) 0.6 Width: (cm) 0.8 Depth: (cm) 0.2 Area: (cm) 0.377 Volume: (cm) 0.075 % Reduction in Area: -498.4% % Reduction in Volume: -1150% Epithelialization: None Tunneling: No Undermining: No Wound Description Classification: Category/Stage II Foul Odor A Diabetic Severity (Wagner): Grade 1 Wound Margin: Flat and Intact Exudate Amount: Small Exudate Type: Serous Exudate Color: amber fter Cleansing: No Wound Bed Granulation Amount: Large (67-100%) Exposed Structure Granulation Quality: Red Fascia Exposed: No Necrotic Amount: None Present (0%) Fat Layer Exposed: No Tendon Exposed: No Groman, Latissa H. ([id number]) Muscle Exposed: No Joint Exposed: No Bone Exposed: No Limited to Skin Breakdown Periwound Skin Texture Texture Color No Abnormalities Noted: No No Abnormalities Noted: No Callus: No Atrophie Blanche: No Crepitus: No Cyanosis: No Excoriation: No Ecchymosis: No Fluctuance: No Erythema: No Friable: No Hemosiderin Staining: No Induration: No Mottled: No Localized Edema: No Pallor: No Rash: No Rubor: No Scarring: No Temperature / Pain Moisture Temperature: No Abnormality No Abnormalities Noted: No Tenderness on Palpation: Yes Dry / Scaly: No Maceration: No Moist: No Wound Preparation Ulcer Cleansing: Rinsed/Irrigated with Saline Topical Anesthetic Applied: Other: lidocaine 4%, Treatment Notes Wound #15 (Right, Medial Calcaneus) 1. Cleansed with: Clean wound with Normal Saline 2. Anesthetic Topical Lidocaine 4% cream to wound bed prior to debridement 4. Dressing Applied: Prisma Ag 5. Secondary Dressing Applied Bordered Foam  Dressing Electronic Signature(s) Signed: 05/05/2016 10:00:43 AM By: Gretta Cool, RN, BSN, Kim RN, BSN Entered By: Gretta Cool, RN, BSN, Kim on 04/30/2016 14:50:20 Weight, Kelena H. ([id number]) -------------------------------------------------------------------------------- Wound Assessment Details Patient Name: Popescu, Clydean H. Date of Service: 04/30/2016 2:15 PM Medical Record Number: [id number] Patient Account Number: [id number] Date of Birth/Sex: Sep 29, 1945 (70 y.o. Female) Treating RN: Cornell Barman Primary Care Physician: Paulita Cradle Other Clinician: Referring Physician: Paulita Cradle Treating Physician/Extender: Frann Rider in Treatment: 78 Wound Status Wound Number: 3 Primary Pressure Ulcer Etiology: Wound Location: Back - Midline Wound Open Wounding Event: Pressure Injury Status: Date Acquired: 05/02/2015 Comorbid Cataracts, Asthma, Hypertension, Type Weeks Of Treatment: 52 History: II Diabetes, Neuropathy, Received Clustered Wound: No Chemotherapy Photos Wound Measurements Length: (cm) 3 Width: (cm) 3.6 Depth: (cm) 0.2 Area: (cm) 8.482 Volume: (cm) 1.696 % Reduction in Area: -3755.5% % Reduction in Volume: -7609.1% Epithelialization: None Tunneling: No Undermining: No Wound Description Classification: Category/Stage IV Wound Margin: Flat and Intact Exudate Amount: Medium Exudate Type: Serous Exudate Color: amber Wound Bed Granulation Amount: Large (67-100%) Exposed Structure  Granulation Quality: Red, Pink Fascia Exposed: No Necrotic Amount: Small (1-33%) Fat Layer Exposed: No Necrotic Quality: Adherent Slough Tendon Exposed: No Muscle Exposed: No Coull, Massiel H. ([id number]) Joint Exposed: No Bone Exposed: Yes Periwound Skin Texture Texture Color No Abnormalities Noted: No No Abnormalities Noted: No Callus: No Atrophie Blanche: No Crepitus: No Cyanosis: No Excoriation: No Ecchymosis: No Fluctuance: No Erythema: Yes Friable: No Erythema  Location: Circumferential Induration: No Hemosiderin Staining: No Localized Edema: No Mottled: No Rash: No Pallor: No Scarring: No Rubor: No Moisture Temperature / Pain No Abnormalities Noted: No Temperature: No Abnormality Dry / Scaly: No Tenderness on Palpation: Yes Maceration: No Moist: No Wound Preparation Ulcer Cleansing: Rinsed/Irrigated with Saline Topical Anesthetic Applied: Other: lidocaine 4%, Treatment Notes Wound #3 (Midline Back) 1. Cleansed with: Clean wound with Normal Saline 2. Anesthetic Topical Lidocaine 4% cream to wound bed prior to debridement 4. Dressing Applied: Santyl Ointment 5. Secondary Dressing Applied Bordered Foam Dressing Electronic Signature(s) Signed: 05/05/2016 10:00:43 AM By: Gretta Cool, RN, BSN, Kim RN, BSN Entered By: Gretta Cool, RN, BSN, Kim on 04/30/2016 14:51:54 Marando, Eniola H. ([id number]) -------------------------------------------------------------------------------- Wound Assessment Details Patient Name: Mcdowell, Cheryel H. Date of Service: 04/30/2016 2:15 PM Medical Record Number: [id number] Patient Account Number: [id number] Date of Birth/Sex: March 18, 1946 (70 y.o. 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 Wound Open Wounding Event: Pressure Injury Status: Date Acquired: 08/01/2015 Comorbid Cataracts, Asthma, Hypertension, Type Weeks Of Treatment: 38 History: II Diabetes, Neuropathy, Received Clustered Wound: No Chemotherapy Photos Wound Measurements Length: (cm) 0.1 Width: (cm) 0.4 Depth: (cm) 0.6 Area: (cm) 0.031 Volume: (cm) 0.019 % Reduction in Area: 99% % Reduction in Volume: 93.9% Epithelialization: None Tunneling: No Undermining: No Wound  Description Classification: Category/Stage III Wound Margin: Flat and Intact Exudate Amount: Medium Exudate Type: Serous Exudate Color: amber Foul Odor After Cleansing: No Wound Bed Granulation Amount: Large (67-100%) Exposed Structure Granulation Quality: Pink Fascia Exposed: No Necrotic Amount: None Present (0%) Fat Layer Exposed: No Tendon Exposed: No Muscle Exposed: No Gracia, Bexley H. ([id number]) Joint Exposed: No Bone Exposed: No Limited to Skin Breakdown Periwound Skin Texture Texture Color No Abnormalities Noted: No No Abnormalities Noted: No Callus: No Atrophie Blanche: No Crepitus: No Cyanosis: No Excoriation: No Ecchymosis: No Fluctuance: No Erythema: No Friable: No Hemosiderin Staining: No Induration: No Mottled: No Localized Edema: No Pallor: No Rash: No Rubor: No Scarring: No Temperature / Pain Moisture Temperature: No Abnormality No Abnormalities Noted: No Tenderness on Palpation: Yes Dry / Scaly: No Maceration: No Moist: Yes Wound Preparation Ulcer Cleansing: Rinsed/Irrigated with Saline Topical Anesthetic Applied: Other: lidocaine 4%, Treatment Notes Wound #7 (Right Ischial Tuberosity) 1. Cleansed with: Clean wound with Normal Saline 2. Anesthetic Topical Lidocaine 4% cream to wound bed prior to debridement 4. Dressing Applied: Prisma Ag 5. Secondary Dressing Applied Bordered Foam Dressing Electronic Signature(s) Signed: 05/05/2016 10:00:43 AM By: Gretta Cool, RN, BSN, Kim RN, BSN Entered By: Gretta Cool, RN, BSN, Kim on 04/30/2016 14:53:17 Lindner, Fatema H. ([id number]) -------------------------------------------------------------------------------- Vitals Details Patient Name: [name], Jaedan H. Date of Service: 04/30/2016 2:15 PM Medical Record Number: [id number] Patient Account Number: [id number] Date of Birth/Sex: 1946-02-01 (70 y.o. Female) Treating RN: Cornell Barman Primary Care Physician: Paulita Cradle Other Clinician: Referring Physician:  Paulita Cradle Treating Physician/Extender: Frann Rider in Treatment: 45 Vital Signs Time Taken: 14:34 Temperature (F): 97.8 Height (in):  65 Pulse (bpm): 117 Weight (lbs): 100 Respiratory Rate (breaths/min): 18 Body Mass Index (BMI): 16.6 Blood Pressure (mmHg): 103/64 Reference Range: 80 - 120 mg / dl Electronic Signature(s) Signed: 05/05/2016 10:00:43 AM By: Gretta Cool, RN, BSN, Kim RN, BSN Entered By: Gretta Cool, RN, BSN, Kim on 04/30/2016 14:34:15

## 2016-05-07 ENCOUNTER — Encounter: Payer: Medicare Other | Admitting: Surgery

## 2016-05-07 DIAGNOSIS — E11621 Type 2 diabetes mellitus with foot ulcer: Secondary | ICD-10-CM | POA: Diagnosis not present

## 2016-05-08 NOTE — Progress Notes (Signed)
MILKEY, Areesha H. ([id number]) Visit Report for 05/07/2016 Arrival Information Details Patient Name: [name], [name] Date of Service: 05/07/2016 3:45 PM Medical Record Number: [id number] Patient Account Number: [id number] Date of Birth/Sex: Nov 25, 1945 (70 y.o. Female) Treating RN: Cornell Barman Primary Care Physician: Paulita Cradle Other Clinician: Referring Physician: Paulita Cradle Treating Physician/Extender: Frann Rider in Treatment: 31 Visit Information History Since Last Visit Added or deleted any medications: No Patient Arrived: Wheel Chair Any new allergies or adverse reactions: No Arrival Time: 15:55 Had a fall or experienced change in No activities of daily living that may affect Accompanied By: husband risk of falls: Transfer Assistance: None Signs or symptoms of abuse/neglect since last No Patient Identification Verified: Yes visito Secondary Verification Process Yes Hospitalized since last visit: No Completed: Has Dressing in Place as Prescribed: Yes Patient Requires Transmission-Based No Pain Present Now: No Precautions: Patient Has Alerts: No Electronic Signature(s) Signed: 05/07/2016 5:00:50 PM By: Gretta Cool, RN, BSN, Kim RN, BSN Entered By: Gretta Cool, RN, BSN, Kim on 05/07/2016 15:56:05 [name], [name] ([id number]) -------------------------------------------------------------------------------- Encounter Discharge Information Details Patient Name: [name], [name] Date of Service: 05/07/2016 3:45 PM Medical Record Number: [id number] Patient Account Number: [id number] Date of Birth/Sex: March 26, 1946 (70 y.o. Female) Treating RN: Cornell Barman Primary Care Physician: Paulita Cradle Other Clinician: Referring Physician: Paulita Cradle Treating Physician/Extender: Frann Rider in Treatment: 3 Encounter Discharge Information Items Discharge Pain Level: 2 Discharge Condition: Stable Ambulatory Status: Wheelchair Discharge Destination:  Home Transportation: Private Auto Accompanied By: husband Schedule Follow-up Appointment: Yes Medication Reconciliation completed and provided to Patient/Care Yes Christain Niznik: Provided on Clinical Summary of Care: 05/07/2016 Form Type Recipient Paper Patient AS Electronic Signature(s) Signed: 05/07/2016 5:00:50 PM By: Gretta Cool RN, BSN, Kim RN, BSN Previous Signature: 05/07/2016 4:37:19 PM Version By: Ruthine Dose Entered By: Gretta Cool RN, BSN, Kim on 05/07/2016 16:37:47 Schuelke, Babara H. ([id number]) -------------------------------------------------------------------------------- General Visit Notes Details Patient Name: [name], [name] Date of Service: 05/07/2016 3:45 PM Medical Record Number: [id number] Patient Account Number: [id number] Date of Birth/Sex: 1946/05/24 (70 y.o. Female) Treating RN: Cornell Barman Primary Care Physician: Paulita Cradle Other Clinician: Referring Physician: Paulita Cradle Treating Physician/Extender: Frann Rider in Treatment: 60 Notes Per patient request she only wants her back looked at today. Electronic Signature(s) Signed: 05/07/2016 5:00:50 PM By: Gretta Cool, RN, BSN, Kim RN, BSN Entered By: Gretta Cool, RN, BSN, Kim on 05/07/2016 16:06:25 [name], [name] ([id number]) -------------------------------------------------------------------------------- Lower Extremity Assessment Details Patient Name: [name], [name] Date of Service: 05/07/2016 3:45 PM Medical Record Number: [id number] Patient Account Number: [id number] Date of Birth/Sex: Dec 15, 1945 (70 y.o. Female) Treating RN: Cornell Barman Primary Care Physician: Paulita Cradle Other Clinician: Referring Physician: Paulita Cradle Treating Physician/Extender: Frann Rider in Treatment: 60 Notes NOT ASSESSED PER PATIENT REQUEST: swelling noticed on right leg. MD notified Electronic Signature(s) Signed: 05/07/2016 5:00:50 PM By: Gretta Cool, RN, BSN, Kim RN, BSN Entered By: Gretta Cool, RN, BSN, Kim on  05/07/2016 16:16:38 Wood, [place]. ([id number]) -------------------------------------------------------------------------------- Multi Wound Chart Details Patient Name: [name], [name] Date of Service: 05/07/2016 3:45 PM Medical Record Number: [id number] Patient Account Number: [id number] Date of Birth/Sex: [birth year] (70 y.o. Female) Treating RN: Cornell Barman Primary Care Physician: Paulita Cradle Other Clinician: Referring Physician: Paulita Cradle Treating Physician/Extender: Frann Rider in Treatment: 60 Vital Signs Height(in): 65 Pulse(bpm): 115 Weight(lbs): 100 Blood Pressure 107/71 (mmHg): Body Mass Index(BMI): 17 Temperature(F): 97.7 Respiratory Rate 18 (breaths/min): Photos: [N/A:N/A] Wound Location: Back - Midline N/A N/A Wounding Event: Pressure Injury N/A N/A Primary Etiology: Pressure Ulcer N/A  N/A Comorbid History: Cataracts, Asthma, N/A N/A Hypertension, Type II Diabetes, Neuropathy, Received Chemotherapy Date Acquired: 05/02/2015 N/A N/A Weeks of Treatment: 48 N/A N/A Wound Status: Open N/A N/A Measurements L x W x D 3x4x0.2 N/A N/A (cm) Area (cm) : 9.425 N/A N/A Volume (cm) : 1.885 N/A N/A % Reduction in Area: -4184.10% N/A N/A % Reduction in Volume: -8468.20% N/A N/A Starting Position 1 10 (o'clock): Ending Position 1 2 (o'clock): Maximum Distance 1 2 (cm): Undermining: Yes N/A N/A [name], [name] ([id number]) Classification: Category/Stage IV N/A N/A Exudate Amount: Medium N/A N/A Exudate Type: Serous N/A N/A Exudate Color: amber N/A N/A Wound Margin: Flat and Intact N/A N/A Granulation Amount: Large (67-100%) N/A N/A Granulation Quality: Red, Pink N/A N/A Necrotic Amount: Small (1-33%) N/A N/A Exposed Structures: Bone: Yes N/A N/A Fascia: No Fat: No Tendon: No Muscle: No Joint: No Epithelialization: None N/A N/A Periwound Skin Texture: Edema: No N/A N/A Excoriation: No Induration: No Callus: No Crepitus:  No Fluctuance: No Friable: No Rash: No Scarring: No Periwound Skin Moist: Yes N/A N/A Moisture: Maceration: No Dry/Scaly: No Periwound Skin Color: Erythema: Yes N/A N/A Atrophie Blanche: No Cyanosis: No Ecchymosis: No Hemosiderin Staining: No Mottled: No Pallor: No Rubor: No Erythema Location: Circumferential N/A N/A Temperature: No Abnormality N/A N/A Tenderness on Yes N/A N/A Palpation: Wound Preparation: Ulcer Cleansing: N/A N/A Rinsed/Irrigated with Saline Topical Anesthetic Applied: Other: lidocaine 4% Treatment Notes [name], [name] ([id number]) Electronic Signature(s) Signed: 05/07/2016 5:00:50 PM By: Gretta Cool, RN, BSN, Kim RN, BSN Entered By: Gretta Cool, RN, BSN, Kim on 05/07/2016 16:23:29 [name], [name] ([id number]) -------------------------------------------------------------------------------- Multi-Disciplinary Care Plan Details Patient Name: [name], Maija H. Date of Service: 05/07/2016 3:45 PM Medical Record Number: [id number] Patient Account Number: [id number] Date of Birth/Sex: 1946/09/01 (70 y.o. Female) Treating RN: Cornell Barman Primary Care Physician: Paulita Cradle Other Clinician: Referring Physician: Paulita Cradle Treating Physician/Extender: Frann Rider in Treatment: 38 Active Inactive Abuse / Safety / Falls / Self Care Management Nursing Diagnoses: Impaired home maintenance Impaired physical mobility Knowledge deficit related to: safety; personal, health (wound), emergency Potential for falls Self care deficit: actual or potential Goals: Patient will remain injury free Date Initiated: 03/13/2015 Goal Status: Active Patient/caregiver will verbalize understanding of skin care regimen Date Initiated: 03/13/2015 Goal Status: Active Patient/caregiver will verbalize/demonstrate measure taken to improve self care Date Initiated: 03/13/2015 Goal Status: Active Patient/caregiver will verbalize/demonstrate measures taken to improve the  patient's personal safety Date Initiated: 03/13/2015 Goal Status: Active Patient/caregiver will verbalize/demonstrate measures taken to prevent injury and/or falls Date Initiated: 03/13/2015 Goal Status: Active Patient/caregiver will verbalize/demonstrate understanding of what to do in case of emergency Date Initiated: 03/13/2015 Goal Status: Active Interventions: Assess fall risk on admission and as needed Assess: immobility, friction, shearing, incontinence upon admission and as needed Assess impairment of mobility on admission and as needed per policy Assess self care needs on admission and as needed Provide education on basic hygiene Rathe, Elba H. ([id number]) Provide education on fall prevention Provide education on personal and home safety Provide education on safe transfers Treatment Activities: Education provided on Basic Hygiene : 08/05/2015 Notes: Pressure Nursing Diagnoses: Knowledge deficit related to causes and risk factors for pressure ulcer development Knowledge deficit related to management of pressures ulcers Potential for impaired tissue integrity related to pressure, friction, moisture, and shear Goals: Patient will remain free from development of additional pressure ulcers Date Initiated: 03/13/2015 Goal Status: Active Patient will remain free of pressure ulcers Date Initiated: 03/13/2015 Goal Status: Active Patient/caregiver  will verbalize risk factors for pressure ulcer development Date Initiated: 03/13/2015 Goal Status: Active Patient/caregiver will verbalize understanding of pressure ulcer management Date Initiated: 03/13/2015 Goal Status: Active Interventions: Assess: immobility, friction, shearing, incontinence upon admission and as needed Assess offloading mechanisms upon admission and as needed Assess potential for pressure ulcer upon admission and as needed Provide education on pressure ulcers Treatment Activities: Patient referred for home  evaluation of offloading devices/mattresses : 01/02/2016 Patient referred for pressure reduction/relief devices : 01/02/2016 Patient referred for seating evaluation to ensure proper offloading : 01/02/2016 Pressure reduction/relief device ordered : 01/02/2016 Test ordered outside of clinic : 01/02/2016 Notes: Wound/Skin Impairment Beltre, Natasha H. ([id number]) Nursing Diagnoses: Impaired tissue integrity Knowledge deficit related to ulceration/compromised skin integrity Goals: Patient/caregiver will verbalize understanding of skin care regimen Date Initiated: 03/13/2015 Goal Status: Active Ulcer/skin breakdown will have a volume reduction of 30% by week 4 Date Initiated: 03/13/2015 Goal Status: Active Ulcer/skin breakdown will have a volume reduction of 50% by week 8 Date Initiated: 03/13/2015 Goal Status: Active Ulcer/skin breakdown will have a volume reduction of 80% by week 12 Date Initiated: 03/13/2015 Goal Status: Active Ulcer/skin breakdown will heal within 14 weeks Date Initiated: 03/13/2015 Goal Status: Active Interventions: Assess patient/caregiver ability to obtain necessary supplies Assess patient/caregiver ability to perform ulcer/skin care regimen upon admission and as needed Assess ulceration(s) every visit Provide education on smoking Provide education on ulcer and skin care Treatment Activities: Patient referred to home care : 01/02/2016 Referred to DME Treavor Blomquist for dressing supplies : 01/02/2016 Skin care regimen initiated : 01/02/2016 Topical wound management initiated : 01/02/2016 Notes: Electronic Signature(s) Signed: 05/07/2016 5:00:50 PM By: Gretta Cool, RN, BSN, Kim RN, BSN Entered By: Gretta Cool, RN, BSN, Kim on 05/07/2016 16:08:47 [name], Mckenzie [place]. ([id number]) -------------------------------------------------------------------------------- Pain Assessment Details Patient Name: [name], [name] Date of Service: 05/07/2016 3:45 PM Medical Record Number: [id number] Patient  Account Number: [id number] Date of Birth/Sex: May 28, 1946 (70 y.o. Female) Treating RN: Cornell Barman Primary Care Physician: Paulita Cradle Other Clinician: Referring Physician: Paulita Cradle Treating Physician/Extender: Frann Rider in Treatment: 64 Active Problems Location of Pain Severity and Description of Pain Patient Has Paino Yes Site Locations Pain Location: Pain in Ulcers With Dressing Change: Yes Duration of the Pain. Constant / Intermittento Constant Rate the pain. Current Pain Level: 8 Worst Pain Level: 8 Character of Pain Describe the Pain: Exhausting, Sharp Pain Management and Medication Current Pain Management: Notes Topical or injectable lidocaine is offered to patient for acute pain when surgical debridement is performed. If needed, Patient is instructed to use over the counter pain medication for the following 24-48 hours after debridement. Wound care MDs do not prescribed pain medications. Patient has chronic pain or uncontrolled pain. Patient has been instructed to make an appointment with their Primary Care Physician for pain management. Electronic Signature(s) Signed: 05/07/2016 5:00:50 PM By: Gretta Cool, RN, BSN, Kim RN, BSN Entered By: Gretta Cool, RN, BSN, Kim on 05/07/2016 15:57:22 [name], [name] ([id number]) -------------------------------------------------------------------------------- Patient/Caregiver Education Details Patient Name: [name], Kewanda H. Date of Service: 05/07/2016 3:45 PM Medical Record Number: [id number] Patient Account Number: [id number] Date of Birth/Gender: 1946-04-29 (70 y.o. Female) Treating RN: Cornell Barman Primary Care Physician: Paulita Cradle Other Clinician: Referring Physician: Paulita Cradle Treating Physician/Extender: Frann Rider in Treatment: 74 Education Assessment Education Provided To: Patient Education Topics Provided Wound/Skin Impairment: Handouts: Other: continue wound care as  prescribed Methods: Demonstration, Explain/Verbal Electronic Signature(s) Signed: 05/07/2016 5:00:50 PM By: Gretta Cool, RN, BSN, Kim RN, BSN Entered By: Gretta Cool, RN, BSN,  Kim on 05/07/2016 16:38:09 [name], [name] ([id number]) -------------------------------------------------------------------------------- Wound Assessment Details Patient Name: [name], [name] Date of Service: 05/07/2016 3:45 PM Medical Record Number: [id number] Patient Account Number: [id number] Date of Birth/Sex: 1945/09/24 (70 y.o. Female) Treating RN: Cornell Barman Primary Care Physician: Paulita Cradle Other Clinician: Referring Physician: Paulita Cradle Treating Physician/Extender: Frann Rider in Treatment: 60 Wound Status Wound Number: 3 Primary Pressure Ulcer Etiology: Wound Location: Back - Midline Wound Open Wounding Event: Pressure Injury Status: Date Acquired: 05/02/2015 Comorbid Cataracts, Asthma, Hypertension, Type Weeks Of Treatment: 53 History: II Diabetes, Neuropathy, Received Clustered Wound: No Chemotherapy Photos Wound Measurements Length: (cm) 3 Width: (cm) 4 Depth: (cm) 0.2 Area: (cm) 9.425 Volume: (cm) 1.885 % Reduction in Area: -4184.1% % Reduction in Volume: -8468.2% Epithelialization: None Undermining: Yes Starting Position (o'clock): 10 Ending Position (o'clock): 2 Maximum Distance: (cm) 2 Wound Description Classification: Category/Stage IV Wound Margin: Flat and Intact Exudate Amount: Medium Exudate Type: Serous Exudate Color: amber Wound Bed Granulation Amount: Large (67-100%) Exposed Structure Granulation Quality: Red, Pink Fascia Exposed: No Youkhana, Kandance H. ([id number]) Necrotic Amount: Small (1-33%) Fat Layer Exposed: No Necrotic Quality: Adherent Slough Tendon Exposed: No Muscle Exposed: No Joint Exposed: No Bone Exposed: Yes Periwound Skin Texture Texture Color No Abnormalities Noted: No No Abnormalities Noted: No Callus: No Atrophie Blanche:  No Crepitus: No Cyanosis: No Excoriation: No Ecchymosis: No Fluctuance: No Erythema: Yes Friable: No Erythema Location: Circumferential Induration: No Hemosiderin Staining: No Localized Edema: No Mottled: No Rash: No Pallor: No Scarring: No Rubor: No Moisture Temperature / Pain No Abnormalities Noted: No Temperature: No Abnormality Dry / Scaly: No Tenderness on Palpation: Yes Maceration: No Moist: Yes Wound Preparation Ulcer Cleansing: Rinsed/Irrigated with Saline Topical Anesthetic Applied: Other: lidocaine 4%, Electronic Signature(s) Signed: 05/07/2016 5:00:50 PM By: Gretta Cool, RN, BSN, Kim RN, BSN Entered By: Gretta Cool, RN, BSN, Kim on 05/07/2016 16:05:03 [name], [name] ([id number]) -------------------------------------------------------------------------------- Vitals Details Patient Name: [name], Adanna H. Date of Service: 05/07/2016 3:45 PM Medical Record Number: [id number] Patient Account Number: [id number] Date of Birth/Sex: [birth year] (70 y.o. Female) Treating RN: Cornell Barman Primary Care Physician: Paulita Cradle Other Clinician: Referring Physician: Paulita Cradle Treating Physician/Extender: Frann Rider in Treatment: 60 Vital Signs Time Taken: 15:37 Temperature (F): 97.7 Height (in): 65 Pulse (bpm): 115 Weight (lbs): 100 Respiratory Rate (breaths/min): 18 Body Mass Index (BMI): 16.6 Blood Pressure (mmHg): 107/71 Reference Range: 80 - 120 mg / dl Electronic Signature(s) Signed: 05/07/2016 5:00:50 PM By: Gretta Cool, RN, BSN, Kim RN, BSN Entered By: Gretta Cool, RN, BSN, Kim on 05/07/2016 15:57:42

## 2016-05-08 NOTE — Progress Notes (Addendum)
[name] H. ([id number]) Visit Report for 05/07/2016 Chief Complaint Document Details Patient Name: [name], [name] Date of Service: 05/07/2016 3:45 PM Medical Record Number: [id number] Patient Account Number: [id number] Date of Birth/Sex: 04-21-1946 (70 y.o. Female) Treating [name]: [name] Primary Care Physician: [name] Other Clinician: Referring Physician: Paulita [name] Treating Physician/Extender: [name] in Treatment: 51 Information Obtained from: Patient Chief Complaint Patient presents to the wound care center for a consult due non healing wound. Ulcers on the right elbow and the right heel for about 1 month. Electronic Signature(s) Signed: 05/07/2016 4:32:43 PM By: Mckenzie Fudge MD, FACS Entered By: [name] on 05/07/2016 16:32:43 [name], [name] ([id number]) -------------------------------------------------------------------------------- Debridement Details Patient Name: [name], [name] Date of Service: 05/07/2016 3:45 PM Medical Record Number: [id number] Patient Account Number: [id number] Date of Birth/Sex: [birth year] (70 y.o. Female) Treating [name]: [name] Primary Care Physician: [name] Other Clinician: Referring Physician: Paulita [name] Treating Physician/Extender: [name] in Treatment: 60 Debridement Performed for Wound #3 Midline Back Assessment: Performed By: Physician Mckenzie Fudge, MD Debridement: Debridement Pre-procedure Yes - 16:15 Verification/Time Out Taken: Start Time: 16:16 Pain Control: Other : lidocaine 4% Level: Skin/Subcutaneous Tissue Total Area Debrided (L x 3 (cm) x 4 (cm) = 12 (cm) W): Tissue and other Viable, Non-Viable, Eschar, Fat, Fibrin/Slough, Subcutaneous material debrided: Instrument: Forceps, Scissors Bleeding: Minimum Hemostasis Achieved: Pressure End Time: 16:23 Procedural Pain: 2 Post Procedural Pain: 2 Response to Treatment: Procedure was tolerated well Post  Debridement Measurements of Total Wound Length: (cm) 3 Stage: Category/Stage IV Width: (cm) 1.5 Depth: (cm) 0.3 Volume: (cm) 1.06 Character of Wound/Ulcer Post Improved Debridement: Severity of Tissue Post Necrosis of muscle Debridement: Post Procedure Diagnosis Same as Pre-procedure Electronic Signature(s) Signed: 05/07/2016 4:32:36 PM By: Mckenzie Fudge MD, FACS Signed: 05/07/2016 5:00:50 PM By: Mckenzie Cool [name], BSN, [name], BSN Entered By: [name] on 05/07/2016 16:32:36 [name], [name] ([id number]) [name], [name] ([id number]) -------------------------------------------------------------------------------- HPI Details Patient Name: [name], [name] Date of Service: 05/07/2016 3:45 PM Medical Record Number: [id number] Patient Account Number: [id number] Date of Birth/Sex: 08-05-1946 (70 y.o. Female) Treating [name]: Montey Hora Primary Care Physician: [name] Other Clinician: Referring Physician: Paulita [name] Treating Physician/Extender: [name] in Treatment: 60 History of Present Illness Location: Ulceration on the right heel and the right elbow. Quality: Patient reports experiencing a dull pain to affected area(s). Severity: Patient states wound (s) are getting better. Duration: Patient has had the wound for < 4 weeks prior to presenting for treatment Timing: Pain in wound is Intermittent (comes and goes Context: The wound appeared gradually over time Modifying Factors: Consults to this date include:Augmentin and Bactrim and also some heel protection with duoderm Associated Signs and Symptoms: Patient reports having difficulty standing for long periods. HPI Description: 70 year old female with history of peripheral neuropathy, history of diet controlled diabetes mellitus type 2, history of alcoholism here for wound consult sent by her PCP Dr. Sherilyn Cooter. She has pressure ulcers at her right elbow, bilateral heels. Plain films of right calcaneus without  acute bony process. Patient started by PCP on Augmentin, Bactrim as per orders, DuoDerm dressings applied - reports some improvement in her ulcer since last seen. Denies fever, chills, nausea, vomiting, diarrhea. She had a right humerus fracture in the middle of May and has had no surgery and arm is in a sling. She is also been laying in the bed for quite a while. Past medical history significant for essential hypertension, osteoporosis, peripheral neuropathy, alcoholism, ataxia,  personal history of breast cancer treated with surgery chemotherapy and radiation and this was done in December 2010. she is also status post laparoscopic cholecystectomy, pilonidal cyst excision, subcutaneous port placement, partial mastectomy on the left side, skin cancer removal. 03/21/2015 -- she says overall she's been doing better and continues to smoke about 15 cigarettes a day. 03/21/2015 - her orthopedic doctor has said she may require surgery for her right humerus fracture. 04/04/2015 -- her orthopedic surgery has been scheduled for August 11. 04/18/2015 -- she is doing fine as far as her elbow and her right heel goes but she has developed some redness over prominence on her thoracic spine and wanted me to take a look at this. 05/02/2015 -- she had her surgery done and now is in a sling and support. Her back has developed a pressure injury of undetermined stage. She seems to be in better spirits. 05/16/2015 -- last week her right heel was looking great and we had healed it out, but she has not been offloading appropriately and has a deep tissue injury on the right heel again. The area on her right elbow has opened out with slough and the area in the thoracic spine is also getting worse. 05/30/2015 -- she has developed 2 new ulcerations one on her left ischial tuberosity and one on the sacral region. She is still working on getting up smoking but is also unable to take her vitamins as she says she develops a  diarrhea when she takes vitamins. She has increased her intake of proteins. 06/06/2015 -- the patient's husband manages to get her a low air loss mattress with initiating pressure but [name], [name] ([id number]) did not know how to use it exactly and the patient was not happy about using it. Overall she says she's been feeling better. 07/11/2015 -- . Discussed a surgical opinion for debridement and application of a wound VAC, 2 weeks ago but the patient has been reluctant to get a surgical opinion as she wants to avoid surgery. 07/18/2015 -- they have an appointment to see Dr. Tamala Julian this coming Wednesday. 07/29/2015 -- they saw Dr. Tamala Julian in his office and he did a debridement of the wound and removed significant amount of slough. This is in addition to the debridement I had done previously on Friday where a large amount of the eschar was removed. He did not recommend the application of wound VAC. Addendum : Dr. Thompson Caul note was reviewed via EPIC and details noted as above. 08/05/2015 -- over the last week she has developed a another pressure injury to her right hip and has had significant discoloration of the skin and an eschar there. 08/15/2015 -- she is still smoking about a pack of cigarettes a day and does not seem to want to quit. They have not been able to talk to the vendor regarding the air mattress and I will ask them to get in touch again. She is reluctant to take vitamins and does admit her nutrition is poor. 08/22/2015 -- she has been unable to tolerate her vitamins and continues to smoke. They're working on getting a low air loss mattress and have been speaking with the vendor. 09/19/2015 -- since her last visit and was admitted to the hospital between 09/09/2015 and 09/16/2015. She was thought to have an active sepsis possibly from one of her decubitus ulcers but nothing was grown except for an MSSA from her thoracic spine region. CT of this area did not show any osteomyelitis.  Been given  IV antibiotics which included vancomycin and Zosyn in the hospital under the care of Dr. [name] the ID specialist she was sent home on oral Keflex 500 mg 3 times a day for 2 weeks. he will consider an MRI in the outpatient setting to completely rule out osteomyelitis of the spine. 10/03/2015 --readmitted to hospital on 09/23/2015 for general debility, lethargy and possible sepsis and workup was in progress. Seen by Dr. [name] and he would also like a workup for underlying malignancy as sheos had previous ultrasounds of the breasts suggesting findings of concern. Workup done so far does not suggest deep bony infection. She was treated for a pneumonia and received Zosyn and vancomycin. She had been recommended Cipro 500 twice a day and doxycycline 100 mg twice a day once she was to be [place] home. The antibiotics were to be stopped on 10/07/2015. 10/17/2015 -- patient is feeling much better and has been eating better and doing her offloading as much as possible. 10/23/2015 -- she has an appointment to see Dr. [name] tomorrow but other than that has been doing as much as possible with offloading and increasing her diet. 11/07/2015 -- she has not been here to see [place] for 2 weeks and at the present time continues to try and eat better, work on off loading and is using the air mattress at night. She saw her PCP yesterday and she has put her back on a fentanyl patch. 11/21/2015 -- for some strange reason she has been sitting in a chair with her legs dependent for the last 6 days nonstop and has developed massive lower extremity edema and pedal edema with weeping and ulceration. 02/13/2016 -- it's been 3 weeks since I saw her last and overall she's done well except for a right heel where she has had some new ulcerations. 04/16/2016 -- last week I had noted that there was bone protruding in the decubitus ulcer of the thoracic spine area and due to the fact that the patient is  extremely labile in her emotional status I had called the patient's husband who came to see me and had a discussion regarding her care. He has primed her during the week and today she is going to be okay to discuss her diagnosis and treatment options. 05/07/2016 -- was in the ER on 04/19/2016 for altered mental status and dehydration. Both the patient and Dominy, Chioma H. ([id number]) her husband were rather limited in the choices of what they would want to do and did not want to be admitted to the hospital nor did they want a CT scan performed. Labs Performed showed that she was low on albumin and her hemoglobin was low but she did not have a mild leukocytosis. Her potassium was low at 3 and her albumin was 2.5 Was rehydrated with IV fluids and then discharged home to follow-up with the PCP closely. Electronic Signature(s) Signed: 05/07/2016 4:32:52 PM By: Mckenzie Fudge MD, FACS Previous Signature: 05/07/2016 3:51:27 PM Version By: Mckenzie Fudge MD, FACS Entered By: [name] on 05/07/2016 16:32:52 [name], [name] ([id number]) -------------------------------------------------------------------------------- Physical Exam Details Patient Name: Taborda, Shirell H. Date of Service: 05/07/2016 3:45 PM Medical Record Number: [id number] Patient Account Number: [id number] Date of Birth/Sex: Jan 09, 1946 (70 y.o. Female) Treating [name]: [name] Primary Care Physician: [name] Other Clinician: Referring Physician: Paulita [name] Treating Physician/Extender: [name] in Treatment: 60 Constitutional . Pulse regular. Respirations normal and unlabored. Afebrile. . Eyes Nonicteric. Reactive to light. Ears, Nose, Mouth, and Throat Lips, teeth,  and gums WNL.Marland Kitchen Moist mucosa without lesions. Neck supple and nontender. No palpable supraclavicular or cervical adenopathy. Normal sized without goiter. Respiratory WNL. No retractions.. Breath sounds WNL, No rubs, rales, rhonchi, or  wheeze.. Cardiovascular Heart rhythm and rate regular, no murmur or gallop.. Pedal Pulses WNL. No clubbing, cyanosis or edema. Chest Breasts symmetical and no nipple discharge.. Breast tissue WNL, no masses, lumps, or tenderness.. Lymphatic No adneopathy. No adenopathy. No adenopathy. Musculoskeletal Adexa without tenderness or enlargement.. Digits and nails w/o clubbing, cyanosis, infection, petechiae, ischemia, or inflammatory conditions.. Integumentary (Hair, Skin) No suspicious lesions. No crepitus or fluctuance. No peri-wound warmth or erythema. No masses.Marland Kitchen Psychiatric Judgement and insight Intact.. No evidence of depression, anxiety, or agitation.. Notes the wound on the back has necrotic debris between the 12 and 6:00 position and this was sharply debrided with forceps and scissors and bleeding controlled with pressure. In the midline the bone is barely palpable now and there is some Electronic Signature(s) Signed: 05/07/2016 4:33:28 PM By: Mckenzie Fudge MD, FACS Entered By: [name] on 05/07/2016 16:33:28 [name], [name] ([id number]) -------------------------------------------------------------------------------- Physician Orders Details Patient Name: [name], [name] Date of Service: 05/07/2016 3:45 PM Medical Record Number: [id number] Patient Account Number: [id number] Date of Birth/Sex: 09-01-1946 (70 y.o. Female) Treating [name]: [name] Primary Care Physician: [name] Other Clinician: Referring Physician: Paulita [name] Treating Physician/Extender: [name] in Treatment: 73 Verbal / Phone Orders: Yes Clinician: Cornell [name] Read Back and Verified: Yes Diagnosis Coding Wound Cleansing Wound #11 Right,Distal Calcaneus o Clean wound with Normal Saline. Wound #15 Left,Medial Calcaneus o Clean wound with Normal Saline. Wound #3 Midline Back o Clean wound with Normal Saline. Wound #7 Right Ischial Tuberosity o Clean wound with Normal  Saline. Anesthetic Wound #3 Midline Back o Topical Lidocaine 4% cream applied to wound bed prior to debridement - applied in clinic only Wound #7 Right Ischial Tuberosity o Topical Lidocaine 4% cream applied to wound bed prior to debridement - applied in clinic only Primary Wound Dressing Wound #11 Right,Distal Calcaneus o Prisma Ag Wound #15 Left,Medial Calcaneus o Prisma Ag Wound #3 Midline Back o Santyl Ointment Wound #7 Right Ischial Tuberosity o Prisma Ag Secondary Dressing Wound #11 Right,Distal Calcaneus o Boardered Foam Dressing [name], [name] ([id number]) Wound #15 Left,Medial Calcaneus o Boardered Foam Dressing Wound #3 Midline Back o Boardered Foam Dressing Wound #7 Right Ischial Tuberosity o Boardered Foam Dressing Dressing Change Frequency Wound #11 Right,Distal Calcaneus o Change dressing every other day. Wound #15 Left,Medial Calcaneus o Change dressing every other day. Wound #3 Midline Back o Change dressing every other day. Wound #7 Right Ischial Tuberosity o Change dressing every other day. Follow-up Appointments Wound #11 Right,Distal Calcaneus o Return Appointment in 1 week. Wound #15 Left,Medial Calcaneus o Return Appointment in 1 week. Wound #3 Midline Back o Return Appointment in 1 week. Wound #7 Right Ischial Tuberosity o Return Appointment in 1 week. Off-Loading Wound #11 Right,Distal Calcaneus o Turn and reposition every 2 hours - Keep pressure off of back and heels Wound #15 Left,Medial Calcaneus o Turn and reposition every 2 hours - Keep pressure off of back and heels Wound #3 Midline Back o Turn and reposition every 2 hours - Keep pressure off of back and heels Wound #7 Right Ischial Tuberosity o Turn and reposition every 2 hours - Keep pressure off of back and heels [name], [name] ([id number]) Additional Orders / Instructions Wound #11 Right,Distal Calcaneus o Stop Smoking o Increase  protein intake. Wound #15  Left,Medial Calcaneus o Stop Smoking o Increase protein intake. Wound #3 Midline Back o Stop Smoking o Increase protein intake. Wound #7 Right Ischial Tuberosity o Stop Smoking o Increase protein intake. Home Health Wound #11 [place] Visits - [place] Nurse may visit PRN to address patientos wound care needs. o FACE TO FACE ENCOUNTER: MEDICARE and MEDICAID PATIENTS: I certify that this patient is under my care and that I had a face-to-face encounter that meets the physician face-to-face encounter requirements with this patient on this date. The encounter with the patient was in whole or in part for the following MEDICAL CONDITION: (primary reason for [place]) MEDICAL NECESSITY: I certify, that based on my findings, NURSING services are a medically necessary home health service. HOME BOUND STATUS: I certify that my clinical findings support that this patient is homebound (i.e., Due to illness or injury, pt requires aid of supportive devices such as crutches, cane, wheelchairs, walkers, the use of special transportation or the assistance of another person to leave their place of residence. There is a normal inability to leave the home and doing so requires considerable and taxing effort. Other absences are for medical reasons / religious services and are infrequent or of short duration when for other reasons). o If current dressing causes regression in wound condition, may D/C ordered dressing product/s and apply Normal Saline Moist Dressing daily until next [place] / Other MD appointment. [place] of regression in wound condition at [phone]. o Please direct any NON-WOUND related issues/requests for orders to patient's Primary Care Physician Wound #15 [place] Visits - [place] Nurse may  visit PRN to address patientos wound care needs. o FACE TO FACE ENCOUNTER: MEDICARE and MEDICAID PATIENTS: I certify that this patient is under my care and that I had a face-to-face encounter that meets the physician face-to-face encounter requirements with this patient on this date. The encounter with the patient was in whole or in part for the following MEDICAL CONDITION: (primary reason for [place]) MEDICAL NECESSITY: I certify, that based on my findings, NURSING services are a medically necessary home health service. HOME BOUND STATUS: I certify that my clinical findings Matt, Lavilla H. ([id number]) support that this patient is homebound (i.e., Due to illness or injury, pt requires aid of supportive devices such as crutches, cane, wheelchairs, walkers, the use of special transportation or the assistance of another person to leave their place of residence. There is a normal inability to leave the home and doing so requires considerable and taxing effort. Other absences are for medical reasons / religious services and are infrequent or of short duration when for other reasons). o If current dressing causes regression in wound condition, may D/C ordered dressing product/s and apply Normal Saline Moist Dressing daily until next [place] / Other MD appointment. [place] of regression in wound condition at [phone]. o Please direct any NON-WOUND related issues/requests for orders to patient's Primary Care Physician Wound #3 Midline Back o [place] Visits - [place] Nurse may visit PRN to address patientos wound care needs. o FACE TO FACE ENCOUNTER: MEDICARE and MEDICAID PATIENTS: I certify that this patient is under my care and that I had a face-to-face encounter that meets the physician face-to-face encounter requirements with this patient on this date. The encounter with the patient was in whole or in part for the  following MEDICAL CONDITION: (primary reason for Home Healthcare) MEDICAL NECESSITY: I certify, that based on my findings, NURSING services are a medically necessary home health service. HOME BOUND STATUS: I certify that my clinical findings support that this patient is homebound (i.e., Due to illness or injury, pt requires aid of supportive devices such as crutches, cane, wheelchairs, walkers, the use of special transportation or the assistance of another person to leave their place of residence. There is a normal inability to leave the home and doing so requires considerable and taxing effort. Other absences are for medical reasons / religious services and are infrequent or of short duration when for other reasons). o If current dressing causes regression in wound condition, may D/C ordered dressing product/s and apply Normal Saline Moist Dressing daily until next [place] / Other MD appointment. [place] of regression in wound condition at [phone]. o Please direct any NON-WOUND related issues/requests for orders to patient's Primary Care Physician Wound #7 Right Ischial [place] Visits - [place] Nurse may visit PRN to address patientos wound care needs. o FACE TO FACE ENCOUNTER: MEDICARE and MEDICAID PATIENTS: I certify that this patient is under my care and that I had a face-to-face encounter that meets the physician face-to-face encounter requirements with this patient on this date. The encounter with the patient was in whole or in part for the following MEDICAL CONDITION: (primary reason for [place]) MEDICAL NECESSITY: I certify, that based on my findings, NURSING services are a medically necessary home health service. HOME BOUND STATUS: I certify that my clinical findings support that this patient is homebound (i.e., Due to illness or injury, pt requires aid of supportive devices such as  crutches, cane, wheelchairs, walkers, the use of special transportation or the assistance of another person to leave their place of residence. There is a normal inability to leave the home and doing so requires considerable and taxing effort. Other absences are for medical reasons / religious services and are infrequent or of short duration when for other reasons). Bozzi, Seini H. ([id number]) o If current dressing causes regression in wound condition, may D/C ordered dressing product/s and apply Normal Saline Moist Dressing daily until next [place] / Other MD appointment. [place] of regression in wound condition at [phone]. o Please direct any NON-WOUND related issues/requests for orders to patient's Primary Care Physician Electronic Signature(s) Signed: 05/07/2016 4:38:59 PM By: Mckenzie Fudge MD, FACS Signed: 05/07/2016 5:00:50 PM By: Mckenzie Cool [name], BSN, [name], BSN Entered By: Mckenzie Cool, [name], BSN, Mckenzie on 05/07/2016 16:36:53 [place], Maanvi H. ([id number]) -------------------------------------------------------------------------------- Problem List Details Patient Name: Pardo, Shatonya H. Date of Service: 05/07/2016 3:45 PM Medical Record Number: [id number] Patient Account Number: [id number] Date of Birth/Sex: [birth year] (70 y.o. 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 ([id number]) I89.0 Lymphedema, not elsewhere classified 11/21/2015 11/21/2015 L97.211 Non-pressure chronic ulcer of right calf limited to 11/21/2015 11/21/2015 breakdown of skin Electronic Signature(s) Signed: 05/07/2016 4:32:19 PM By: Mckenzie Fudge MD, FACS Entered By: [name] on 05/07/2016 16:32:19 [place]z, Onnika H. ([id number]) -------------------------------------------------------------------------------- Progress Note Details Patient Name: Geigle, Genoa H. Date of Service: 05/07/2016 3:45 PM Medical Record Number: [id number] Patient Account Number: [id number] Date of Birth/Sex: 23-Aug-1946 (70 y.o. Female) Treating [name]: [name] Primary Care Physician: [name] Other Clinician: Referring Physician: Paulita [name] Treating Physician/Extender: [name] in Treatment: 11 Subjective Chief Complaint Information obtained from Patient Patient presents to the wound care center for a consult due non healing wound. Ulcers on the right elbow and the right heel for about 1 month. History of Present Illness (HPI) The following HPI elements were documented for the patient's wound: Location: Ulceration on the right heel and the right elbow. Quality: Patient reports experiencing a dull pain to affected area(s). Severity: Patient states wound (s) are getting better. Duration: Patient has had the wound for < 4 weeks prior to presenting for treatment Timing: Pain in wound is Intermittent (comes and goes Context: The wound appeared gradually over time Modifying Factors: Consults to this date include:Augmentin and Bactrim  and also some heel protection with duoderm Associated Signs and Symptoms: Patient reports having difficulty standing for long periods. 70 year old female with history of peripheral neuropathy, history of diet controlled diabetes mellitus type 2, history of alcoholism here for wound consult sent by her PCP Dr. Sherilyn Cooter. She has pressure ulcers at her right elbow, bilateral heels. Plain films of right calcaneus without acute bony process. Patient started by PCP on Augmentin, Bactrim as per orders, DuoDerm dressings applied - reports some improvement in her ulcer since last seen. Denies fever, chills, nausea, vomiting, diarrhea. She had a right humerus fracture in the middle of May and has had no surgery and arm is in a sling. She is also been laying in the bed for quite a while. Past medical history significant for essential hypertension, osteoporosis, peripheral neuropathy, alcoholism, ataxia, personal history of breast cancer treated with surgery chemotherapy and radiation and this was done in December 2010. she is also status post laparoscopic cholecystectomy, pilonidal cyst excision, subcutaneous port placement, partial mastectomy on the left side, skin cancer removal. 03/21/2015 -- she says overall she's been doing better and continues to smoke about 15 cigarettes a day. 03/21/2015 - her orthopedic doctor has said she may require surgery for her right humerus fracture. 04/04/2015 -- her orthopedic surgery has been scheduled for August 11. 04/18/2015 -- she is doing fine as far as her elbow and her right heel goes but she has developed some redness over prominence on her thoracic spine and wanted me to take a look at this. Ding, Jaylee H. ([id number]) 05/02/2015 -- she had her surgery done and now is in a sling and support. Her back has developed a pressure injury of undetermined stage. She seems to be in better spirits. 05/16/2015 -- last week her right heel was looking great and we had healed  it out, but she has not been offloading appropriately and has a deep tissue injury on the right heel again. The area on her right elbow has opened out with slough and the area in the thoracic spine is also getting worse. 05/30/2015 -- she has developed 2 new ulcerations  one on her left ischial tuberosity and one on the sacral region. She is still working on getting up smoking but is also unable to take her vitamins as she says she develops a diarrhea when she takes vitamins. She has increased her intake of proteins. 06/06/2015 -- the patient's husband manages to get her a low air loss mattress with initiating pressure but did not know how to use it exactly and the patient was not happy about using it. Overall she says she's been feeling better. 07/11/2015 -- . Discussed a surgical opinion for debridement and application of a wound VAC, 2 weeks ago but the patient has been reluctant to get a surgical opinion as she wants to avoid surgery. 07/18/2015 -- they have an appointment to see Dr. Tamala Julian this coming Wednesday. 07/29/2015 -- they saw Dr. Tamala Julian in his office and he did a debridement of the wound and removed significant amount of slough. This is in addition to the debridement I had done previously on Friday where a large amount of the eschar was removed. He did not recommend the application of wound VAC. Addendum : Dr. Thompson Caul note was reviewed via EPIC and details noted as above. 08/05/2015 -- over the last week she has developed a another pressure injury to her right hip and has had significant discoloration of the skin and an eschar there. 08/15/2015 -- she is still smoking about a pack of cigarettes a day and does not seem to want to quit. They have not been able to talk to the vendor regarding the air mattress and I will ask them to get in touch again. She is reluctant to take vitamins and does admit her nutrition is poor. 08/22/2015 -- she has been unable to tolerate her vitamins and  continues to smoke. They're working on getting a low air loss mattress and have been speaking with the vendor. 09/19/2015 -- since her last visit and was admitted to the hospital between 09/09/2015 and 09/16/2015. She was thought to have an active sepsis possibly from one of her decubitus ulcers but nothing was grown except for an MSSA from her thoracic spine region. CT of this area did not show any osteomyelitis. Been given IV antibiotics which included vancomycin and Zosyn in the hospital under the care of Dr. [name] the ID specialist she was sent home on oral Keflex 500 mg 3 times a day for 2 weeks. he will consider an MRI in the outpatient setting to completely rule out osteomyelitis of the spine. 10/03/2015 --readmitted to hospital on 09/23/2015 for general debility, lethargy and possible sepsis and workup was in progress. Seen by Dr. [name] and he would also like a workup for underlying malignancy as she s had previous ultrasounds of the breasts suggesting findings of concern. Workup done so far does not suggest deep bony infection. She was treated for a pneumonia and received Zosyn and vancomycin. She had been recommended Cipro 500 twice a day and doxycycline 100 mg twice a day once she was to be [place] home. The antibiotics were to be stopped on 10/07/2015. 10/17/2015 -- patient is feeling much better and has been eating better and doing her offloading as much as possible. 10/23/2015 -- she has an appointment to see Dr. [name] tomorrow but other than that has been doing as much as possible with offloading and increasing her diet. 11/07/2015 -- she has not been here to see [place] for 2 weeks and at the present time continues to try and eat better, work on  off loading and is using the air mattress at night. She saw her PCP yesterday and she has put her back on a fentanyl patch. 11/21/2015 -- for some strange reason she has been sitting in a chair with her legs dependent for the last  6 days nonstop and has developed massive lower extremity edema and pedal edema with weeping and ulceration. Tabar, Pallie H. ([id number]) 02/13/2016 -- it's been 3 weeks since I saw her last and overall she's done well except for a right heel where she has had some new ulcerations. 04/16/2016 -- last week I had noted that there was bone protruding in the decubitus ulcer of the thoracic spine area and due to the fact that the patient is extremely labile in her emotional status I had called the patient's husband who came to see me and had a discussion regarding her care. He has primed her during the week and today she is going to be okay to discuss her diagnosis and treatment options. 05/07/2016 -- was in the ER on 04/19/2016 for altered mental status and dehydration. Both the patient and her husband were rather limited in the choices of what they would want to do and did not want to be admitted to the hospital nor did they want a CT scan performed. Labs Performed showed that she was low on albumin and her hemoglobin was low but she did not have a mild leukocytosis. Her potassium was low at 3 and her albumin was 2.5 Was rehydrated with IV fluids and then discharged home to follow-up with the PCP closely. Objective Constitutional Pulse regular. Respirations normal and unlabored. Afebrile. Vitals Time Taken: 3:37 PM, Height: 65 in, Weight: 100 lbs, BMI: 16.6, Temperature: 97.7 F, Pulse: 115 bpm, Respiratory Rate: 18 breaths/min, Blood Pressure: 107/71 mmHg. Eyes Nonicteric. Reactive to light. Ears, Nose, Mouth, and Throat Lips, teeth, and gums WNL.Marland Kitchen Moist mucosa without lesions. Neck supple and nontender. No palpable supraclavicular or cervical adenopathy. Normal sized without goiter. Respiratory WNL. No retractions.. Breath sounds WNL, No rubs, rales, rhonchi, or wheeze.. Cardiovascular Heart rhythm and rate regular, no murmur or gallop.. Pedal Pulses WNL. No clubbing, cyanosis or  edema. Chest Breasts symmetical and no nipple discharge.. Breast tissue WNL, no masses, lumps, or tenderness.. Lymphatic No adneopathy. No adenopathy. No adenopathy. Grider, Ravynn H. ([id number]) Musculoskeletal Adexa without tenderness or enlargement.. Digits and nails w/o clubbing, cyanosis, infection, petechiae, ischemia, or inflammatory conditions.Marland Kitchen Psychiatric Judgement and insight Intact.. No evidence of depression, anxiety, or agitation.. General Notes: the wound on the back has necrotic debris between the 12 and 6:00 position and this was sharply debrided with forceps and scissors and bleeding controlled with pressure. In the midline the bone is barely palpable now and there is some Integumentary (Hair, Skin) No suspicious lesions. No crepitus or fluctuance. No peri-wound warmth or erythema. No masses.. Wound #3 status is Open. Original cause of wound was Pressure Injury. The wound is located on the Midline Back. The wound measures 3cm length x 4cm width x 0.2cm depth; 9.425cm^2 area and 1.885cm^3 volume. There is bone exposed. There is undermining starting at 10:00 and ending at 2:00 with a maximum distance of 2cm. There is a medium amount of serous drainage noted. The wound margin is flat and intact. There is large (67-100%) red, pink granulation within the wound bed. There is a small (1-33%) amount of necrotic tissue within the wound bed including Adherent Slough. The periwound skin appearance exhibited: Moist, Erythema. The periwound skin appearance did not exhibit:  Callus, Crepitus, Excoriation, Fluctuance, Friable, Induration, Localized Edema, Rash, Scarring, Dry/Scaly, Maceration, Atrophie Blanche, Cyanosis, Ecchymosis, Hemosiderin Staining, Mottled, Pallor, Rubor. The surrounding wound skin color is noted with erythema which is circumferential. Periwound temperature was noted as No Abnormality. The periwound has tenderness on palpation. 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 There was a Skin/Subcutaneous Tissue Suarez, Evellyn H. ([id number]) Debridement [id number]) debridement with total area of 12 sq cm performed by Mckenzie Fudge, MD. with the following instrument(s): Forceps and Scissors to remove Viable and Non-Viable tissue/material including Fat, Fibrin/Slough, Eschar, and Subcutaneous after achieving pain control using Other (lidocaine 4%). A time out was conducted at 16:15, prior to the start of the procedure. A Minimum amount of bleeding was controlled with Pressure. The procedure was tolerated well with a pain level of 2 throughout and a pain level of 2 following the procedure. Post Debridement Measurements: 3cm length x 1.5cm width x 0.3cm depth; 1.06cm^3 volume. Post debridement Stage noted as Category/Stage IV. Character of Wound/Ulcer Post Debridement is improved. Severity of Tissue Post Debridement is: Necrosis of muscle. Post procedure Diagnosis Wound #3: Same as Pre-Procedure Plan Wound Cleansing: Wound #11 Right,Distal Calcaneus: Clean wound with Normal Saline. Wound #15 Left,Medial Calcaneus: Clean wound with Normal Saline. Wound #3 Midline Back: Clean wound with Normal Saline. Wound #7 Right Ischial Tuberosity: Clean wound with Normal Saline. Anesthetic: Wound #3 Midline Back: Topical Lidocaine 4% cream applied to wound bed prior to debridement - applied in clinic only Wound #7 Right Ischial Tuberosity: Topical Lidocaine 4% cream applied to wound bed prior to debridement - applied in clinic only Primary Wound Dressing: Wound #11 Right,Distal  Calcaneus: Prisma Ag Wound #15 Left,Medial Calcaneus: Prisma Ag Wound #3 Midline Back: Santyl Ointment Wound #7 Right Ischial Tuberosity: Prisma Ag Secondary Dressing: Wound #11 Right,Distal Calcaneus: Boardered Foam Dressing Wound #15 Left,Medial Calcaneus: Boardered Foam Dressing Wound #3 Midline Back: Boardered Foam Dressing Wound #7 Right Ischial Tuberosity: Boardered Foam Dressing Wordell, Navia H. ([id number]) Dressing Change Frequency: Wound #11 Right,Distal Calcaneus: Change dressing every other day. Wound #15 Left,Medial Calcaneus: Change dressing every other day. Wound #3 Midline Back: Change dressing every other day. Wound #7 Right Ischial Tuberosity: Change dressing every other day. Follow-up Appointments: Wound #11 Right,Distal Calcaneus: Return Appointment in 1 week. Wound #15 Left,Medial Calcaneus: Return Appointment in 1 week. Wound #3 Midline Back: Return Appointment in 1 week. Wound #7 Right Ischial Tuberosity: Return Appointment in 1 week. Off-Loading: Wound #11 Right,Distal Calcaneus: Turn and reposition every 2 hours - Keep pressure off of back and heels Wound #15 Left,Medial Calcaneus: Turn and reposition every 2 hours - Keep pressure off of back and heels Wound #3 Midline Back: Turn and reposition every 2 hours - Keep pressure off of back and heels Wound #7 Right Ischial Tuberosity: Turn and reposition every 2 hours - Keep pressure off of back and heels Additional Orders / Instructions: Wound #11 Right,Distal Calcaneus: Stop Smoking Increase protein intake. Wound #15 Left,Medial Calcaneus: Stop Smoking Increase protein intake. Wound #3 Midline Back: Stop Smoking Increase protein intake. Wound #7 Right Ischial Tuberosity: Stop Smoking Increase protein intake. Home Health: Wound #11 Right,Distal Calcaneus: Continue Home Health  Visits - [facility] Nurse may visit PRN to address patient s wound care needs. FACE TO FACE ENCOUNTER:  MEDICARE and MEDICAID PATIENTS: I certify that this patient is under my care and that I had a face-to-face encounter that meets the physician face-to-face encounter requirements with this patient on this date. The encounter with the patient was in whole or in part for the following MEDICAL CONDITION: (primary reason for [place]) MEDICAL NECESSITY: I certify, that based on my findings, NURSING services are a medically necessary home health service. HOME BOUND STATUS: I certify that my clinical findings support that this patient is homebound (i.e., Due to illness or injury, pt requires aid of supportive devices such as crutches, cane, wheelchairs, walkers, the use Guttman, Ahonesty H. ([id number]) of special transportation or the assistance of another person to leave their place of residence. There is a normal inability to leave the home and doing so requires considerable and taxing effort. Other absences are for medical reasons / religious services and are infrequent or of short duration when for other reasons). If current dressing causes regression in wound condition, may D/C ordered dressing product/s and apply Normal Saline Moist Dressing daily until next [place] / Other MD appointment. [place] of regression in wound condition at [phone]. Please direct any NON-WOUND related issues/requests for orders to patient's Primary Care Physician Wound #15 Left,Medial Calcaneus: [employer] Visits - [facility] Nurse may visit PRN to address patient s wound care needs. FACE TO FACE ENCOUNTER: MEDICARE and MEDICAID PATIENTS: I certify that this patient is under my care and that I had a face-to-face encounter that meets the physician face-to-face encounter requirements with this patient on this date. The encounter with the patient was in whole or in part for the following MEDICAL CONDITION: (primary reason for [place]) MEDICAL NECESSITY: I  certify, that based on my findings, NURSING services are a medically necessary home health service. HOME BOUND STATUS: I certify that my clinical findings support that this patient is homebound (i.e., Due to illness or injury, pt requires aid of supportive devices such as crutches, cane, wheelchairs, walkers, the use of special transportation or the assistance of another person to leave their place of residence. There is a normal inability to leave the home and doing so requires considerable and taxing effort. Other absences are for medical reasons / religious services and are infrequent or of short duration when for other reasons). If current dressing causes regression in wound condition, may D/C ordered dressing product/s and apply Normal Saline Moist Dressing daily until next [place] / Other MD appointment. [place] of regression in wound condition at [phone]. Please direct any NON-WOUND related issues/requests for orders to patient's Primary Care Physician Wound #3 Midline Back: [place] Visits - [facility] Nurse may visit PRN to address patient s wound care needs. FACE TO FACE ENCOUNTER: MEDICARE and MEDICAID PATIENTS: I certify that this patient is under my care and that I had a face-to-face encounter that meets the physician face-to-face encounter requirements with this patient on this date. The encounter with the patient was in whole or in part for the following MEDICAL CONDITION: (primary reason for [place]) MEDICAL NECESSITY: I certify, that based on my findings, NURSING services are a medically necessary home health service. HOME BOUND STATUS: I certify that my clinical findings support that this patient is homebound (i.e., Due to illness or injury, pt requires aid  of supportive devices such as crutches, cane, wheelchairs, walkers, the use of special transportation or the assistance of another person to leave their  place of residence. There is a normal inability to leave the home and doing so requires considerable and taxing effort. Other absences are for medical reasons / religious services and are infrequent or of short duration when for other reasons). If current dressing causes regression in wound condition, may D/C ordered dressing product/s and apply Normal Saline Moist Dressing daily until next [place] / Other MD appointment. [place] of regression in wound condition at [phone]. Please direct any NON-WOUND related issues/requests for orders to patient's Primary Care Physician Wound #7 Right Ischial Tuberosity: [place] Visits - [facility] Nurse may visit PRN to address patient s wound care needs. FACE TO FACE ENCOUNTER: MEDICARE and MEDICAID PATIENTS: I certify that this patient is under my care and that I had a face-to-face encounter that meets the physician face-to-face encounter requirements with this patient on this date. The encounter with the patient was in whole or in part for the following MEDICAL CONDITION: (primary reason for [place]) MEDICAL NECESSITY: I certify, that based on my findings, NURSING services are a medically necessary home health service. HOME BOUND STATUS: I certify that my clinical findings support that this patient is homebound (i.e., Due to illness or injury, pt requires aid of supportive devices such as crutches, cane, wheelchairs, walkers, the use Regner, Corlene H. ([id number]) of special transportation or the assistance of another person to leave their place of residence. There is a normal inability to leave the home and doing so requires considerable and taxing effort. Other absences are for medical reasons / religious services and are infrequent or of short duration when for other reasons). If current dressing causes regression in wound condition, may D/C ordered dressing product/s and apply Normal  Saline Moist Dressing daily until next [place] / Other MD appointment. [place] of regression in wound condition at [phone]. Please direct any NON-WOUND related issues/requests for orders to patient's Primary Care Physician In view of her progressive debility and weakness in general she wants to put the MRI on hold and I'm agreeable about this. discussed using Santyl on the back and Prisma AG on her feet and she will come back and see me next week. Elevation of her limbs and good protein intake has been emphasized Electronic Signature(s) Signed: 05/10/2016 3:53:43 PM By: Mckenzie Fudge MD, FACS Previous Signature: 05/10/2016 3:53:34 PM Version By: Mckenzie Fudge MD, FACS Previous Signature: 05/07/2016 4:34:26 PM Version By: Mckenzie Fudge MD, FACS Entered By: [name] on 05/10/2016 15:53:43 [place]low, Taisha H. ([id number]) -------------------------------------------------------------------------------- SuperBill Details Patient Name: [name], Ahmya H. Date of Service: 05/07/2016 Medical Record Number: [id number] Patient Account Number: [id number] Date of Birth/Sex: 1946-03-16 (70 y.o. Female) Treating [name]: [name] Primary Care Physician: [name] Other Clinician: Referring Physician: Paulita [name] Treating Physician/Extender: [name] in Treatment: 60 Diagnosis Coding ICD-10 Codes Code Description E11.621 Type 2 diabetes mellitus with foot ulcer F17.218 Nicotine dependence, cigarettes, with other nicotine-induced disorders L89.100 Pressure ulcer of unspecified part of back, unstageable L89.213 Pressure ulcer of right hip, stage 3 L89.613 Pressure ulcer of right heel, stage 3 Facility Procedures CPT4 Code: [id number] Description: [id number] - DEB SUBQ TISSUE 20 SQ CM/< ICD-10 Description Diagnosis 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.100 Pressure ulcer of unspecified part of back, uns L89.213 Pressure ulcer of right  hip,  stage 3 L89.613 Pressure ulcer of  right heel, stage 3 Modifier: tageable Quantity: 1 Physician Procedures CPT4 Code: [id number] Description: [id number] - WC PHYS SUBQ TISS 20 SQ CM ICD-10 Description Diagnosis 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.100 Pressure ulcer of unspecified part of back, uns L89.213 Pressure ulcer of right hip, stage 3 L89.613 Pressure ulcer of  right heel, stage 3 Modifier: tageable Quantity: 1 Electronic Signature(s) Signed: 05/07/2016 4:34:39 PM By: Mckenzie Fudge MD, FACS Entered By: [name] on 05/07/2016 16:34:39

## 2016-05-14 ENCOUNTER — Encounter: Payer: Medicare Other | Attending: Surgery | Admitting: Surgery

## 2016-05-14 DIAGNOSIS — M810 Age-related osteoporosis without current pathological fracture: Secondary | ICD-10-CM | POA: Insufficient documentation

## 2016-05-14 DIAGNOSIS — L89100 Pressure ulcer of unspecified part of back, unstageable: Secondary | ICD-10-CM | POA: Diagnosis not present

## 2016-05-14 DIAGNOSIS — L89613 Pressure ulcer of right heel, stage 3: Secondary | ICD-10-CM | POA: Insufficient documentation

## 2016-05-14 DIAGNOSIS — E11621 Type 2 diabetes mellitus with foot ulcer: Secondary | ICD-10-CM | POA: Insufficient documentation

## 2016-05-14 DIAGNOSIS — F1021 Alcohol dependence, in remission: Secondary | ICD-10-CM | POA: Diagnosis not present

## 2016-05-14 DIAGNOSIS — L89213 Pressure ulcer of right hip, stage 3: Secondary | ICD-10-CM | POA: Insufficient documentation

## 2016-05-14 DIAGNOSIS — E1140 Type 2 diabetes mellitus with diabetic neuropathy, unspecified: Secondary | ICD-10-CM | POA: Insufficient documentation

## 2016-05-14 DIAGNOSIS — I10 Essential (primary) hypertension: Secondary | ICD-10-CM | POA: Diagnosis not present

## 2016-05-14 DIAGNOSIS — F17218 Nicotine dependence, cigarettes, with other nicotine-induced disorders: Secondary | ICD-10-CM | POA: Diagnosis not present

## 2016-05-15 NOTE — Progress Notes (Signed)
[name], [name] ([id number]) Visit Report for 05/14/2016 Chief Complaint Document Details Patient Name: [name], [name] Date of Service: 05/14/2016 2:15 PM Medical Record Number: [id number] Patient Account Number: [id number] Date of Birth/Sex: 1945-10-03 (70 y.o. Female) Treating RN: Cornell Barman Primary Care Physician: Paulita Cradle Other Clinician: Referring Physician: Paulita Cradle Treating Physician/Extender: Frann Rider in Treatment: 16 Information Obtained from: Patient Chief Complaint Patient presents to the wound care center for a consult due non healing wound. Ulcers on the right elbow and the right heel for about 1 month. Electronic Signature(s) Signed: 05/14/2016 3:15:15 PM By: Christin Fudge MD, FACS Entered By: Christin Fudge on 05/14/2016 15:15:15 Pitter, Bridgette H. ([id number]) -------------------------------------------------------------------------------- HPI Details Patient Name: [name], [name] Date of Service: 05/14/2016 2:15 PM Medical Record Number: [id number] Patient Account Number: [id number] Date of Birth/Sex: [birth year] (70 y.o. Female) Treating RN: Cornell Barman Primary Care Physician: Paulita Cradle Other Clinician: Referring Physician: Paulita Cradle Treating Physician/Extender: Frann Rider in Treatment: 48 History of Present Illness Location: Ulceration on the right heel and the right elbow. Quality: Patient reports experiencing a dull pain to affected area(s). Severity: Patient states wound (s) are getting better. Duration: Patient has had the wound for < 4 weeks prior to presenting for treatment Timing: Pain in wound is Intermittent (comes and goes Context: The wound appeared gradually over time Modifying Factors: Consults to this date include:Augmentin and Bactrim and also some heel protection with duoderm Associated Signs and Symptoms: Patient reports having difficulty standing for long periods. HPI Description: 70 year old female  with history of peripheral neuropathy, history of diet controlled diabetes mellitus type 2, history of alcoholism here for wound consult sent by her PCP Dr. Sherilyn Cooter. She has pressure ulcers at her right elbow, bilateral heels. Plain films of right calcaneus without acute bony process. Patient started by PCP on Augmentin, Bactrim as per orders, DuoDerm dressings applied - reports some improvement in her ulcer since last seen. Denies fever, chills, nausea, vomiting, diarrhea. She had a right humerus fracture in the middle of May and has had no surgery and arm is in a sling. She is also been laying in the bed for quite a while. Past medical history significant for essential hypertension, osteoporosis, peripheral neuropathy, alcoholism, ataxia, personal history of breast cancer treated with surgery chemotherapy and radiation and this was done in December 2010. she is also status post laparoscopic cholecystectomy, pilonidal cyst excision, subcutaneous port placement, partial mastectomy on the left side, skin cancer removal. 03/21/2015 -- she says overall she's been doing better and continues to smoke about 15 cigarettes a day. 03/21/2015 - her orthopedic doctor has said she may require surgery for her right humerus fracture. 04/04/2015 -- her orthopedic surgery has been scheduled for August 11. 04/18/2015 -- she is doing fine as far as her elbow and her right heel goes but she has developed some redness over prominence on her thoracic spine and wanted me to take a look at this. 05/02/2015 -- she had her surgery done and now is in a sling and support. Her back has developed a pressure injury of undetermined stage. She seems to be in better spirits. 05/16/2015 -- last week her right heel was looking great and we had healed it out, but she has not been offloading appropriately and has a deep tissue injury on the right heel again. The area on her right elbow has opened out with slough and the area in the  thoracic spine is also getting worse. 05/30/2015 -- she has developed  2 new ulcerations one on her left ischial tuberosity and one on the sacral region. She is still working on getting up smoking but is also unable to take her vitamins as she says she develops a diarrhea when she takes vitamins. She has increased her intake of proteins. 06/06/2015 -- the patient's husband manages to get her a low air loss mattress with initiating pressure but [name], [name] ([id number]) did not know how to use it exactly and the patient was not happy about using it. Overall she says she's been feeling better. 07/11/2015 -- . Discussed a surgical opinion for debridement and application of a wound VAC, 2 weeks ago but the patient has been reluctant to get a surgical opinion as she wants to avoid surgery. 07/18/2015 -- they have an appointment to see Dr. Tamala Julian this coming Wednesday. 07/29/2015 -- they saw Dr. Tamala Julian in his office and he did a debridement of the wound and removed significant amount of slough. This is in addition to the debridement I had done previously on Friday where a large amount of the eschar was removed. He did not recommend the application of wound VAC. Addendum : Dr. Thompson Caul note was reviewed via EPIC and details noted as above. 08/05/2015 -- over the last week she has developed a another pressure injury to her right hip and has had significant discoloration of the skin and an eschar there. 08/15/2015 -- she is still smoking about a pack of cigarettes a day and does not seem to want to quit. They have not been able to talk to the vendor regarding the air mattress and I will ask them to get in touch again. She is reluctant to take vitamins and does admit her nutrition is poor. 08/22/2015 -- she has been unable to tolerate her vitamins and continues to smoke. They're working on getting a low air loss mattress and have been speaking with the vendor. 09/19/2015 -- since her last visit and was  admitted to the hospital between 09/09/2015 and 09/16/2015. She was thought to have an active sepsis possibly from one of her decubitus ulcers but nothing was grown except for an MSSA from her thoracic spine region. CT of this area did not show any osteomyelitis. Been given IV antibiotics which included vancomycin and Zosyn in the hospital under the care of Dr. [name] the ID specialist she was sent home on oral Keflex 500 mg 3 times a day for 2 weeks. he will consider an MRI in the outpatient setting to completely rule out osteomyelitis of the spine. 10/03/2015 --readmitted to hospital on 09/23/2015 for general debility, lethargy and possible sepsis and workup was in progress. Seen by Dr. [name] and he would also like a workup for underlying malignancy as sheos had previous ultrasounds of the breasts suggesting findings of concern. Workup done so far does not suggest deep bony infection. She was treated for a pneumonia and received Zosyn and vancomycin. She had been recommended Cipro 500 twice a day and doxycycline 100 mg twice a day once she was to be [place] home. The antibiotics were to be stopped on 10/07/2015. 10/17/2015 -- patient is feeling much better and has been eating better and doing her offloading as much as possible. 10/23/2015 -- she has an appointment to see Dr. [name] tomorrow but other than that has been doing as much as possible with offloading and increasing her diet. 11/07/2015 -- she has not been here to see [place] for 2 weeks and at the present time continues to  try and eat better, work on off loading and is using the air mattress at night. She saw her PCP yesterday and she has put her back on a fentanyl patch. 11/21/2015 -- for some strange reason she has been sitting in a chair with her legs dependent for the last 6 days nonstop and has developed massive lower extremity edema and pedal edema with weeping and ulceration. 02/13/2016 -- it's been 3 weeks since I saw  her last and overall she's done well except for a right heel where she has had some new ulcerations. 04/16/2016 -- last week I had noted that there was bone protruding in the decubitus ulcer of the thoracic spine area and due to the fact that the patient is extremely labile in her emotional status I had called the patient's husband who came to see me and had a discussion regarding her care. He has primed her during the week and today she is going to be okay to discuss her diagnosis and treatment options. 05/07/2016 -- was in the ER on 04/19/2016 for altered mental status and dehydration. Both the patient and Steil, Talani H. ([id number]) her husband were rather limited in the choices of what they would want to do and did not want to be admitted to the hospital nor did they want a CT scan performed. Labs Performed showed that she was low on albumin and her hemoglobin was low but she did not have a mild leukocytosis. Her potassium was low at 3 and her albumin was 2.5 Was rehydrated with IV fluids and then discharged home to follow-up with the PCP closely. Electronic Signature(s) Signed: 05/14/2016 3:15:24 PM By: Christin Fudge MD, FACS Entered By: Christin Fudge on 05/14/2016 15:15:24 Sublette, Kendel H. ([id number]) -------------------------------------------------------------------------------- Physical Exam Details Patient Name: Kleeman, Jianni H. Date of Service: 05/14/2016 2:15 PM Medical Record Number: [id number] Patient Account Number: [id number] Date of Birth/Sex: [birth year] (70 y.o. Female) Treating RN: Cornell Barman Primary Care Physician: Paulita Cradle Other Clinician: Referring Physician: Paulita Cradle Treating Physician/Extender: Frann Rider in Treatment: 66 Constitutional . Pulse regular. Respirations normal and unlabored. Afebrile. . Eyes Nonicteric. Reactive to light. Ears, Nose, Mouth, and Throat Lips, teeth, and gums WNL.Marland Kitchen Moist mucosa without lesions. Neck supple and  nontender. No palpable supraclavicular or cervical adenopathy. Normal sized without goiter. Respiratory WNL. No retractions.. Cardiovascular Pedal Pulses WNL. No clubbing, cyanosis or edema. Lymphatic No adneopathy. No adenopathy. No adenopathy. Musculoskeletal Adexa without tenderness or enlargement.. Digits and nails w/o clubbing, cyanosis, infection, petechiae, ischemia, or inflammatory conditions.. Integumentary (Hair, Skin) No suspicious lesions. No crepitus or fluctuance. No peri-wound warmth or erythema. No masses.Marland Kitchen Psychiatric Judgement and insight Intact.. No evidence of depression, anxiety, or agitation.. Notes she has much more anasarca today than before in both lower extremities have significant lymphedema. The heels continue to have maceration around the wounds and the right hip which had been looking very good before is now fairly deep with serosanguineous discharge and some slough at the base. Thoracic spine area continues to have necrotic debris but she will not consent to debridement today. Electronic Signature(s) Signed: 05/14/2016 3:20:41 PM By: Christin Fudge MD, FACS Entered By: Christin Fudge on 05/14/2016 15:20:40 Pagett, Annelies HMarland Kitchen ([id number]) -------------------------------------------------------------------------------- Physician Orders Details Patient Name: [name], Zarahi H. Date of Service: 05/14/2016 2:15 PM Medical Record Number: [id number] Patient Account Number: [id number] Date of Birth/Sex: 13-Jul-1946 (70 y.o. Female) Treating RN: Montey Hora Primary Care Physician: Paulita Cradle Other Clinician: Referring Physician: Paulita Cradle Treating Physician/Extender: Frann Rider in  Treatment: 36 Verbal / Phone Orders: Yes Clinician: Montey Hora Read Back and Verified: Yes Diagnosis Coding Wound Cleansing Wound #11 Right,Distal Calcaneus o Clean wound with Normal Saline. Wound #15 Left,Medial Calcaneus o Clean wound with Normal  Saline. Wound #3 Midline Back o Clean wound with Normal Saline. Wound #7 Right Ischial Tuberosity o Clean wound with Normal Saline. Anesthetic Wound #11 Right,Distal Calcaneus o Topical Lidocaine 4% cream applied to wound bed prior to debridement - applied in clinic only Wound #15 Left,Medial Calcaneus o Topical Lidocaine 4% cream applied to wound bed prior to debridement - applied in clinic only Wound #3 Midline Back o Topical Lidocaine 4% cream applied to wound bed prior to debridement - applied in clinic only Wound #7 Right Ischial Tuberosity o Topical Lidocaine 4% cream applied to wound bed prior to debridement - applied in clinic only Primary Wound Dressing Wound #11 Right,Distal Calcaneus o Prisma Ag Wound #15 Left,Medial Calcaneus o Prisma Ag Wound #3 Midline Back o Santyl Ointment - ischial tuberosity use plain packing strip also Wound #7 Right Ischial Tuberosity Jim, Adwoa H. ([id number]) o Santyl Ointment - ischial tuberosity use plain packing strip also Secondary Dressing Wound #11 Right,Distal Calcaneus o Boardered Foam Dressing Wound #15 Left,Medial Calcaneus o Boardered Foam Dressing Wound #3 Midline Back o Boardered Foam Dressing Wound #7 Right Ischial Tuberosity o Boardered Foam Dressing Dressing Change Frequency Wound #11 Right,Distal Calcaneus o Change dressing every other day. Wound #15 Left,Medial Calcaneus o Change dressing every other day. Wound #3 Midline Back o Change dressing every day. Wound #7 Right Ischial Tuberosity o Change dressing every day. Follow-up Appointments Wound #11 Right,Distal Calcaneus o Return Appointment in 1 week. Wound #15 Left,Medial Calcaneus o Return Appointment in 1 week. Wound #3 Midline Back o Return Appointment in 1 week. Wound #7 Right Ischial Tuberosity o Return Appointment in 1 week. Off-Loading Wound #11 Right,Distal Calcaneus o Turn and reposition every 2  hours - Keep pressure off of back and heels Wound #15 Left,Medial Calcaneus o Turn and reposition every 2 hours - Keep pressure off of back and heels Roeper, Briahna H. ([id number]) Wound #3 Midline Back o Turn and reposition every 2 hours - Keep pressure off of back and heels Wound #7 Right Ischial Tuberosity o Turn and reposition every 2 hours - Keep pressure off of back and heels Additional Orders / Instructions Wound #11 Right,Distal Calcaneus o Stop Smoking o Increase protein intake. Wound #15 Left,Medial Calcaneus o Stop Smoking o Increase protein intake. Wound #3 Midline Back o Stop Smoking o Increase protein intake. Wound #7 Right Ischial Tuberosity o Stop Smoking o Increase protein intake. Home Health Wound #11 [place] Visits - [place] Nurse may visit PRN to address patientos wound care needs. o FACE TO FACE ENCOUNTER: MEDICARE and MEDICAID PATIENTS: I certify that this patient is under my care and that I had a face-to-face encounter that meets the physician face-to-face encounter requirements with this patient on this date. The encounter with the patient was in whole or in part for the following MEDICAL CONDITION: (primary reason for [place]) MEDICAL NECESSITY: I certify, that based on my findings, NURSING services are a medically necessary home health service. HOME BOUND STATUS: I certify that my clinical findings support that this patient is homebound (i.e., Due to illness or injury, pt requires aid of supportive devices such as crutches, cane, wheelchairs, walkers, the use of special transportation or the assistance of another person to leave  their place of residence. There is a normal inability to leave the home and doing so requires considerable and taxing effort. Other absences are for medical reasons / religious services and are infrequent or of short duration when for other  reasons). o If current dressing causes regression in wound condition, may D/C ordered dressing product/s and apply Normal Saline Moist Dressing daily until next [place] / Other MD appointment. [place] of regression in wound condition at [phone]. o Please direct any NON-WOUND related issues/requests for orders to patient's Primary Care Physician Wound #15 [place] Visits - [place] Nurse may visit PRN to address patientos wound care needs. Salado, Zarriah H. ([id number]) o FACE TO FACE ENCOUNTER: MEDICARE and MEDICAID PATIENTS: I certify that this patient is under my care and that I had a face-to-face encounter that meets the physician face-to-face encounter requirements with this patient on this date. The encounter with the patient was in whole or in part for the following MEDICAL CONDITION: (primary reason for [place]) MEDICAL NECESSITY: I certify, that based on my findings, NURSING services are a medically necessary home health service. HOME BOUND STATUS: I certify that my clinical findings support that this patient is homebound (i.e., Due to illness or injury, pt requires aid of supportive devices such as crutches, cane, wheelchairs, walkers, the use of special transportation or the assistance of another person to leave their place of residence. There is a normal inability to leave the home and doing so requires considerable and taxing effort. Other absences are for medical reasons / religious services and are infrequent or of short duration when for other reasons). o If current dressing causes regression in wound condition, may D/C ordered dressing product/s and apply Normal Saline Moist Dressing daily until next [place] / Other MD appointment. [place] of regression in wound condition at [phone]. o Please direct any NON-WOUND related  issues/requests for orders to patient's Primary Care Physician Wound #3 Midline Back o [place] Visits - [place] Nurse may visit PRN to address patientos wound care needs. o FACE TO FACE ENCOUNTER: MEDICARE and MEDICAID PATIENTS: I certify that this patient is under my care and that I had a face-to-face encounter that meets the physician face-to-face encounter requirements with this patient on this date. The encounter with the patient was in whole or in part for the following MEDICAL CONDITION: (primary reason for [place]) MEDICAL NECESSITY: I certify, that based on my findings, NURSING services are a medically necessary home health service. HOME BOUND STATUS: I certify that my clinical findings support that this patient is homebound (i.e., Due to illness or injury, pt requires aid of supportive devices such as crutches, cane, wheelchairs, walkers, the use of special transportation or the assistance of another person to leave their place of residence. There is a normal inability to leave the home and doing so requires considerable and taxing effort. Other absences are for medical reasons / religious services and are infrequent or of short duration when for other reasons). o If current dressing causes regression in wound condition, may D/C ordered dressing product/s and apply Normal Saline Moist Dressing daily until next [place] / Other MD appointment. [place] of regression in wound condition at [phone]. o Please direct any NON-WOUND related issues/requests for orders to patient's Primary Care Physician Wound #7 Right Ischial [place] Visits - [place] Cocking  o Home Health Nurse may visit PRN to address patientos wound care needs. o FACE TO FACE ENCOUNTER: MEDICARE and MEDICAID PATIENTS: I certify that this patient is under my care and that I had a face-to-face encounter that meets the  physician face-to-face encounter requirements with this patient on this date. The encounter with the patient was in whole or in part for the following MEDICAL CONDITION: (primary reason for [place]) MEDICAL NECESSITY: I certify, that based on my findings, NURSING services are a medically necessary home health service. HOME BOUND STATUS: I certify that my clinical findings support that this patient is homebound (i.e., Due to illness or injury, pt requires aid of supportive devices such as crutches, cane, wheelchairs, walkers, the use of special Tobey, Shadiamond H. ([id number]) transportation or the assistance of another person to leave their place of residence. There is a normal inability to leave the home and doing so requires considerable and taxing effort. Other absences are for medical reasons / religious services and are infrequent or of short duration when for other reasons). o If current dressing causes regression in wound condition, may D/C ordered dressing product/s and apply Normal Saline Moist Dressing daily until next [place] / Other MD appointment. [place] of regression in wound condition at [phone]. o Please direct any NON-WOUND related issues/requests for orders to patient's Primary Care Physician Electronic Signature(s) Signed: 05/14/2016 3:52:23 PM By: Christin Fudge MD, FACS Signed: 05/14/2016 4:27:47 PM By: Montey Hora Entered By: Montey Hora on 05/14/2016 15:09:31 Pettinger, Catherene H. ([id number]) -------------------------------------------------------------------------------- Problem List Details Patient Name: Bowley, Renessa H. Date of Service: 05/14/2016 2:15 PM Medical Record Number: [id number] Patient Account Number: [id number] Date of Birth/Sex: 22-Jul-1946 (70 y.o. 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 [id number]) I89.0 Lymphedema, not elsewhere classified 11/21/2015 11/21/2015 L97.211 Non-pressure chronic ulcer of right calf limited to 11/21/2015 11/21/2015 breakdown of skin Electronic Signature(s) Signed: 05/14/2016 3:15:07 PM By: Christin Fudge MD, FACS Entered By: Christin Fudge on 05/14/2016 15:15:07 Dunlow, Shantoya H. ([id number]) -------------------------------------------------------------------------------- Progress Note Details Patient Name: Reitz, Krithi H. Date of Service: 05/14/2016 2:15 PM Medical Record Number: [id number] Patient Account Number: [id number] Date of Birth/Sex: [birth year] (70 y.o. Female) Treating RN: Cornell Barman Primary Care Physician: Paulita Cradle Other Clinician: Referring Physician: Paulita Cradle Treating Physician/Extender: Frann Rider in Treatment: 92 Subjective Chief Complaint Information obtained from Patient Patient presents to the wound care center for a consult due non healing wound. Ulcers on the right elbow and the right heel for about 1 month. History of Present Illness (HPI) The following HPI elements were  documented for the patient's wound: Location: Ulceration on the right heel and the right elbow. Quality: Patient reports experiencing a dull pain to  affected area(s). Severity: Patient states wound (s) are getting better. Duration: Patient has had the wound for < 4 weeks prior to presenting for treatment Timing: Pain in wound is Intermittent (comes and goes Context: The wound appeared gradually over time Modifying Factors: Consults to this date include:Augmentin and Bactrim and also some heel protection with duoderm Associated Signs and Symptoms: Patient reports having difficulty standing for long periods. 70 year old female with history of peripheral neuropathy, history of diet controlled diabetes mellitus type 2, history of alcoholism here for wound consult sent by her PCP Dr. Sherilyn Cooter. She has pressure ulcers at her right elbow, bilateral heels. Plain films of right calcaneus without acute bony process. Patient started by PCP on Augmentin, Bactrim as per orders, DuoDerm dressings applied - reports some improvement in her ulcer since last seen. Denies fever, chills, nausea, vomiting, diarrhea. She had a right humerus fracture in the middle of May and has had no surgery and arm is in a sling. She is also been laying in the bed for quite a while. Past medical history significant for essential hypertension, osteoporosis, peripheral neuropathy, alcoholism, ataxia, personal history of breast cancer treated with surgery chemotherapy and radiation and this was done in December 2010. she is also status post laparoscopic cholecystectomy, pilonidal cyst excision, subcutaneous port placement, partial mastectomy on the left side, skin cancer removal. 03/21/2015 -- she says overall she's been doing better and continues to smoke about 15 cigarettes a day. 03/21/2015 - her orthopedic doctor has said she may require surgery for her right humerus fracture. 04/04/2015 -- her orthopedic surgery has been scheduled  for August 11. 04/18/2015 -- she is doing fine as far as her elbow and her right heel goes but she has developed some redness over prominence on her thoracic spine and wanted me to take a look at this. Erisman, Veleka H. ([id number]) 05/02/2015 -- she had her surgery done and now is in a sling and support. Her back has developed a pressure injury of undetermined stage. She seems to be in better spirits. 05/16/2015 -- last week her right heel was looking great and we had healed it out, but she has not been offloading appropriately and has a deep tissue injury on the right heel again. The area on her right elbow has opened out with slough and the area in the thoracic spine is also getting worse. 05/30/2015 -- she has developed 2 new ulcerations one on her left ischial tuberosity and one on the sacral region. She is still working on getting up smoking but is also unable to take her vitamins as she says she develops a diarrhea when she takes vitamins. She has increased her intake of proteins. 06/06/2015 -- the patient's husband manages to get her a low air loss mattress with initiating pressure but did not know how to use it exactly and the patient was not happy about using it. Overall she says she's been feeling better. 07/11/2015 -- . Discussed a surgical opinion for debridement and application of a wound VAC, 2 weeks ago but the patient has been reluctant to get a surgical opinion as she wants to avoid surgery. 07/18/2015 -- they have an appointment to see Dr. Tamala Julian this coming Wednesday. 07/29/2015 -- they saw Dr. Tamala Julian in his office and he did a debridement of the wound and removed significant amount of slough. This is in addition to the debridement I had done previously on Friday where a large amount of the eschar was removed. He did not recommend the  application of wound VAC. Addendum : Dr. Thompson Caul note was reviewed via EPIC and details noted as above. 08/05/2015 -- over the last week she has  developed a another pressure injury to her right hip and has had significant discoloration of the skin and an eschar there. 08/15/2015 -- she is still smoking about a pack of cigarettes a day and does not seem to want to quit. They have not been able to talk to the vendor regarding the air mattress and I will ask them to get in touch again. She is reluctant to take vitamins and does admit her nutrition is poor. 08/22/2015 -- she has been unable to tolerate her vitamins and continues to smoke. They're working on getting a low air loss mattress and have been speaking with the vendor. 09/19/2015 -- since her last visit and was admitted to the hospital between 09/09/2015 and 09/16/2015. She was thought to have an active sepsis possibly from one of her decubitus ulcers but nothing was grown except for an MSSA from her thoracic spine region. CT of this area did not show any osteomyelitis. Been given IV antibiotics which included vancomycin and Zosyn in the hospital under the care of Dr. [name] the ID specialist she was sent home on oral Keflex 500 mg 3 times a day for 2 weeks. he will consider an MRI in the outpatient setting to completely rule out osteomyelitis of the spine. 10/03/2015 --readmitted to hospital on 09/23/2015 for general debility, lethargy and possible sepsis and workup was in progress. Seen by Dr. [name] and he would also like a workup for underlying malignancy as she s had previous ultrasounds of the breasts suggesting findings of concern. Workup done so far does not suggest deep bony infection. She was treated for a pneumonia and received Zosyn and vancomycin. She had been recommended Cipro 500 twice a day and doxycycline 100 mg twice a day once she was to be [place] home. The antibiotics were to be stopped on 10/07/2015. 10/17/2015 -- patient is feeling much better and has been eating better and doing her offloading as much as possible. 10/23/2015 -- she has an appointment to  see Dr. [name] tomorrow but other than that has been doing as much as possible with offloading and increasing her diet. 11/07/2015 -- she has not been here to see [place] for 2 weeks and at the present time continues to try and eat better, work on off loading and is using the air mattress at night. She saw her PCP yesterday and she has put her back on a fentanyl patch. 11/21/2015 -- for some strange reason she has been sitting in a chair with her legs dependent for the last 6 days nonstop and has developed massive lower extremity edema and pedal edema with weeping and ulceration. Fass, Rama H. ([id number]) 02/13/2016 -- it's been 3 weeks since I saw her last and overall she's done well except for a right heel where she has had some new ulcerations. 04/16/2016 -- last week I had noted that there was bone protruding in the decubitus ulcer of the thoracic spine area and due to the fact that the patient is extremely labile in her emotional status I had called the patient's husband who came to see me and had a discussion regarding her care. He has primed her during the week and today she is going to be okay to discuss her diagnosis and treatment options. 05/07/2016 -- was in the ER on 04/19/2016 for altered mental status and dehydration. Both  the patient and her husband were rather limited in the choices of what they would want to do and did not want to be admitted to the hospital nor did they want a CT scan performed. Labs Performed showed that she was low on albumin and her hemoglobin was low but she did not have a mild leukocytosis. Her potassium was low at 3 and her albumin was 2.5 Was rehydrated with IV fluids and then discharged home to follow-up with the PCP closely. Objective Constitutional Pulse regular. Respirations normal and unlabored. Afebrile. Vitals Time Taken: 2:36 PM, Height: 65 in, Weight: 93 lbs, BMI: 15.5, Temperature: 98.1 F, Pulse: 111 bpm, Respiratory Rate: 18  breaths/min, Blood Pressure: 102/69 mmHg. Eyes Nonicteric. Reactive to light. Ears, Nose, Mouth, and Throat Lips, teeth, and gums WNL.Marland Kitchen Moist mucosa without lesions. Neck supple and nontender. No palpable supraclavicular or cervical adenopathy. Normal sized without goiter. Respiratory WNL. No retractions.. Cardiovascular Pedal Pulses WNL. No clubbing, cyanosis or edema. Lymphatic No adneopathy. No adenopathy. No adenopathy. Musculoskeletal Adexa without tenderness or enlargement.. Digits and nails w/o clubbing, cyanosis, infection, petechiae, ischemia, or inflammatory conditions.Marland Kitchen Cordrey, Ashonti H. ([id number]) Psychiatric Judgement and insight Intact.. No evidence of depression, anxiety, or agitation.. General Notes: she has much more anasarca today than before in both lower extremities have significant lymphedema. The heels continue to have maceration around the wounds and the right hip which had been looking very good before is now fairly deep with serosanguineous discharge and some slough at the base. Thoracic spine area continues to have necrotic debris but she will not consent to debridement today. Integumentary (Hair, Skin) No suspicious lesions. No crepitus or fluctuance. No peri-wound warmth or erythema. No masses.. Wound #11 status is Open. Original cause of wound was Pressure Injury. The wound is located on the Right,Distal Calcaneus. The wound measures 0.9cm length x 0.6cm width x 0.1cm depth; 0.424cm^2 area and 0.042cm^3 volume. The wound is limited to skin breakdown. There is no tunneling or undermining noted. There is a small amount of serous drainage noted. The wound margin is flat and intact. There is large (67-100%) pink granulation within the wound bed. There is a small (1-33%) amount of necrotic tissue within the wound bed including Adherent Slough. The periwound skin appearance exhibited: Localized Edema, Maceration. The periwound skin appearance did not exhibit:  Callus, Crepitus, Excoriation, Fluctuance, Friable, Induration, Rash, Scarring, Dry/Scaly, Moist, Atrophie Blanche, Cyanosis, Ecchymosis, Hemosiderin Staining, Mottled, Pallor, Rubor, Erythema. Periwound temperature was noted as No Abnormality. The periwound has tenderness on palpation. Wound #15 status is Open. Original cause of wound was Pressure Injury. The wound is located on the Left,Medial Calcaneus. The wound measures 0.6cm length x 0.7cm width x 0.2cm depth; 0.33cm^2 area and 0.066cm^3 volume. The wound is limited to skin breakdown. There is no tunneling or undermining noted. There is a small amount of serous drainage noted. The wound margin is flat and intact. There is large (67-100%) red granulation within the wound bed. There is no necrotic tissue within the wound bed. The periwound skin appearance exhibited: Maceration. The periwound skin appearance did not exhibit: Callus, Crepitus, Excoriation, Fluctuance, Friable, Induration, Localized Edema, Rash, Scarring, Dry/Scaly, Moist, Atrophie Blanche, Cyanosis, Ecchymosis, Hemosiderin Staining, Mottled, Pallor, Rubor, Erythema. Periwound temperature was noted as No Abnormality. The periwound has tenderness on palpation. Wound #3 status is Open. Original cause of wound was Pressure Injury. The wound is located on the Midline Back. The wound measures 3cm length x 4cm width x 0.4cm depth; 9.425cm^2 area and 3.77cm^3  volume. There is bone exposed. There is undermining starting at 10:00 and ending at 2:00 with a maximum distance of 1.5cm. There is a medium amount of serous drainage noted. The wound margin is flat and intact. There is large (67-100%) red, pink granulation within the wound bed. There is a small (1-33%) amount of necrotic tissue within the wound bed including Eschar and Adherent Slough. The periwound skin appearance exhibited: Moist, Erythema. The periwound skin appearance did not exhibit: Callus, Crepitus, Excoriation,  Fluctuance, Friable, Induration, Localized Edema, Rash, Scarring, Dry/Scaly, Maceration, Atrophie Blanche, Cyanosis, Ecchymosis, Hemosiderin Staining, Mottled, Pallor, Rubor. The surrounding wound skin color is noted with erythema which is circumferential. Periwound temperature was noted as No Abnormality. The periwound has tenderness on palpation. Wound #7 status is Open. Original cause of wound was Pressure Injury. The wound is located on the Right Ischial Tuberosity. The wound measures 0.4cm length x 0.9cm width x 1cm depth; 0.283cm^2 area and 0.283cm^3 volume. The wound is limited to skin breakdown. There is a medium amount of serous drainage noted. The wound margin is flat and intact. There is large (67-100%) pink granulation within the wound bed. There is no necrotic tissue within the wound bed. The periwound skin appearance exhibited: Moist, Rijos, Glynn H. ([id number]) Ecchymosis. The periwound skin appearance did not exhibit: Callus, Crepitus, Excoriation, Fluctuance, Friable, Induration, Localized Edema, Rash, Scarring, Dry/Scaly, Maceration, Atrophie Blanche, Cyanosis, Hemosiderin Staining, Mottled, Pallor, Rubor, Erythema. Periwound temperature was noted as No Abnormality. The periwound has tenderness on palpation. 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 Wound #15 Left,Medial Calcaneus: Clean wound with Normal Saline. Wound #3 Midline Back: Clean wound with Normal Saline. Wound #7 Right Ischial Tuberosity: Clean wound with Normal Saline. Anesthetic: Wound #11 Right,Distal Calcaneus: Topical Lidocaine 4%  cream applied to wound bed prior to debridement - applied in clinic only Wound #15 Left,Medial Calcaneus: Topical Lidocaine 4% cream applied to wound bed prior to debridement - applied in clinic only Wound #3 Midline Back: Topical Lidocaine 4% cream applied to wound bed prior to debridement - applied in clinic only Wound #7 Right Ischial Tuberosity: Topical Lidocaine 4% cream applied to wound bed prior to debridement - applied in clinic only Primary Wound Dressing: Wound #11 Right,Distal Calcaneus: Prisma Ag Wound #15 Left,Medial Calcaneus: Prisma Ag Wound #3 Midline Back: Navejas, Ginnie H. ([id number]) Santyl Ointment - ischial tuberosity use plain packing strip also Wound #7 Right Ischial Tuberosity: Santyl Ointment - ischial tuberosity use plain packing strip also Secondary Dressing: Wound #11 Right,Distal Calcaneus: Boardered Foam Dressing Wound #15 Left,Medial Calcaneus: Boardered Foam Dressing Wound #3 Midline Back: Boardered Foam Dressing Wound #7 Right Ischial Tuberosity: Boardered Foam Dressing Dressing Change Frequency: Wound #11 Right,Distal Calcaneus: Change dressing every other day. Wound #15 Left,Medial Calcaneus: Change dressing every other day. Wound #3 Midline Back: Change dressing every day. Wound #7 Right Ischial Tuberosity: Change dressing every day. Follow-up Appointments: Wound #11 Right,Distal Calcaneus: Return Appointment in 1 week. Wound #15 Left,Medial Calcaneus: Return Appointment in 1 week. Wound #3 Midline Back: Return Appointment in 1 week. Wound #7 Right Ischial Tuberosity: Return Appointment in 1 week. Off-Loading: Wound #11 Right,Distal Calcaneus: Turn and reposition every 2 hours - Keep  pressure off of back and heels Wound #15 Left,Medial Calcaneus: Turn and reposition every 2 hours - Keep pressure off of back and heels Wound #3 Midline Back: Turn and reposition every 2 hours - Keep pressure off of back and heels Wound #7 Right Ischial  Tuberosity: Turn and reposition every 2 hours - Keep pressure off of back and heels Additional Orders / Instructions: Wound #11 Right,Distal Calcaneus: Stop Smoking Increase protein intake. Wound #15 Left,Medial Calcaneus: Stop Smoking Increase protein intake. Wound #3 Midline Back: Stop Smoking Increase protein intake. Wound #7 Right Ischial Tuberosity: Stop Smoking Cleland, Calani H. ([id number]) Increase protein intake. Home Health: Wound #11 Right,Distal Calcaneus: Continue Home Health Visits - [facility] Nurse may visit PRN to address patient s wound care needs. FACE TO FACE ENCOUNTER: MEDICARE and MEDICAID PATIENTS: I certify that this patient is under my care and that I had a face-to-face encounter that meets the physician face-to-face encounter requirements with this patient on this date. The encounter with the patient was in whole or in part for the following MEDICAL CONDITION: (primary reason for [place]) MEDICAL NECESSITY: I certify, that based on my findings, NURSING services are a medically necessary home health service. HOME BOUND STATUS: I certify that my clinical findings support that this patient is homebound (i.e., Due to illness or injury, pt requires aid of supportive devices such as crutches, cane, wheelchairs, walkers, the use of special transportation or the assistance of another person to leave their place of residence. There is a normal inability to leave the home and doing so requires considerable and taxing effort. Other absences are for medical reasons / religious services and are infrequent or of short duration when for other reasons). If current dressing causes regression in wound condition, may D/C ordered dressing product/s and apply Normal Saline Moist Dressing daily until next [place] / Other MD appointment. [place] of regression in wound condition at [phone]. Please direct any NON-WOUND related  issues/requests for orders to patient's Primary Care Physician Wound #15 Left,Medial Calcaneus: [place] Visits - [facility] Nurse may visit PRN to address patient s wound care needs. FACE TO FACE ENCOUNTER: MEDICARE and MEDICAID PATIENTS: I certify that this patient is under my care and that I had a face-to-face encounter that meets the physician face-to-face encounter requirements with this patient on this date. The encounter with the patient was in whole or in part for the following MEDICAL CONDITION: (primary reason for [place]) MEDICAL NECESSITY: I certify, that based on my findings, NURSING services are a medically necessary home health service. HOME BOUND STATUS: I certify that my clinical findings support that this patient is homebound (i.e., Due to illness or injury, pt requires aid of supportive devices such as crutches, cane, wheelchairs, walkers, the use of special transportation or the assistance of another person to leave their place of residence. There is a normal inability to leave the home and doing so requires considerable and taxing effort. Other absences are for medical reasons / religious services and are infrequent or of short duration when for other reasons). If current dressing causes regression in wound condition, may D/C ordered dressing product/s and apply Normal Saline Moist Dressing daily until next [place] / Other MD appointment. [place] of regression in wound condition at [phone]. Please direct any NON-WOUND related issues/requests for orders to patient's Primary Care Physician Wound #3 Midline Back: [place] Visits - [facility]  Nurse may visit PRN to address patient s wound care needs. FACE TO FACE ENCOUNTER: MEDICARE and MEDICAID PATIENTS: I certify that this patient is under my care and that I had a face-to-face encounter that meets the physician face-to-face  encounter requirements with this patient on this date. The encounter with the patient was in whole or in part for the following MEDICAL CONDITION: (primary reason for [place]) MEDICAL NECESSITY: I certify, that based on my findings, NURSING services are a medically necessary home health service. HOME BOUND STATUS: I certify that my clinical findings support that this patient is homebound (i.e., Due to illness or injury, pt requires aid of supportive devices such as crutches, cane, wheelchairs, walkers, the use of special transportation or the assistance of another person to leave their place of residence. There is a normal inability to leave the home and doing so requires considerable and taxing effort. Other absences are for medical reasons / religious services and are infrequent or of short duration when for other reasons). If current dressing causes regression in wound condition, may D/C ordered dressing product/s and apply Normal Saline Moist Dressing daily until next [place] / Other MD appointment. Notify Wound Gesner, Jazlin H. ([id number]) [place] of regression in wound condition at [phone]. Please direct any NON-WOUND related issues/requests for orders to patient's Primary Care Physician Wound #7 Right Ischial Tuberosity: [place] Visits - [facility] Nurse may visit PRN to address patient s wound care needs. FACE TO FACE ENCOUNTER: MEDICARE and MEDICAID PATIENTS: I certify that this patient is under my care and that I had a face-to-face encounter that meets the physician face-to-face encounter requirements with this patient on this date. The encounter with the patient was in whole or in part for the following MEDICAL CONDITION: (primary reason for [place]) MEDICAL NECESSITY: I certify, that based on my findings, NURSING services are a medically necessary home health service. HOME BOUND STATUS: I certify that my clinical findings  support that this patient is homebound (i.e., Due to illness or injury, pt requires aid of supportive devices such as crutches, cane, wheelchairs, walkers, the use of special transportation or the assistance of another person to leave their place of residence. There is a normal inability to leave the home and doing so requires considerable and taxing effort. Other absences are for medical reasons / religious services and are infrequent or of short duration when for other reasons). If current dressing causes regression in wound condition, may D/C ordered dressing product/s and apply Normal Saline Moist Dressing daily until next [place] / Other MD appointment. [place] of regression in wound condition at [phone]. Please direct any NON-WOUND related issues/requests for orders to patient's Primary Care Physician I discussed using Santyl on the back and Aquacell AG on her feet and she will come back and see me next week. Elevation of her limbs and good protein intake has been emphasized Electronic Signature(s) Signed: 05/14/2016 3:53:21 PM By: Christin Fudge MD, FACS Previous Signature: 05/14/2016 3:21:41 PM Version By: Christin Fudge MD, FACS Entered By: Christin Fudge on 05/14/2016 15:53:21 Mable, Gioia H. ([id number]) -------------------------------------------------------------------------------- SuperBill Details Patient Name: [name], Jaclynne H. Date of Service: 05/14/2016 Medical Record Number: [id number] Patient Account Number: [id number] Date of Birth/Sex: [birth year] (70 y.o. 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, stage 3 L89.613 Pressure ulcer of right heel, stage 3 Facility Procedures CPT4 Code: [id number] Description: [phone] - WOUND CARE VISIT-LEV 5 EST PT Modifier: Quantity: 1 Physician Procedures CPT4 Code Description: [id number] - WC PHYS LEVEL 3 - EST PT ICD-10 Description Diagnosis E11.621 Type 2 diabetes mellitus with foot ulcer F17.218 Nicotine dependence, cigarettes, with other nicot L89.100 Pressure ulcer of unspecified part of back,  unsta 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

## 2016-05-20 NOTE — Progress Notes (Signed)
[name], [name] ([id number]) Visit Report for 05/14/2016 Arrival Information Details Patient Name: [name], [name] Date of Service: 05/14/2016 2:15 PM Medical Record Number: [id number] Patient Account Number: [id number] Date of Birth/Sex: December 15, 1945 (70 y.o. Female) Treating RN: Cornell Barman Primary Care Physician: Paulita Cradle Other Clinician: Referring Physician: Paulita Cradle Treating Physician/Extender: Frann Rider in Treatment: 35 Visit Information History Since Last Visit Added or deleted any medications: No Patient Arrived: Wheel Chair Any new allergies or adverse reactions: No Arrival Time: 14:34 Had a fall or experienced change in No Accompanied By: husband, activities of daily living that may affect [name] risk of falls: Transfer Assistance: Manual Signs or symptoms of abuse/neglect since last No Patient Identification Verified: Yes visito Secondary Verification Process Yes Hospitalized since last visit: No Completed: Has Dressing in Place as Prescribed: Yes Patient Requires Transmission-Based No Pain Present Now: Yes Precautions: Patient Has Alerts: No Electronic Signature(s) Signed: 05/19/2016 5:55:03 PM By: Gretta Cool, RN, BSN, Kim RN, BSN Entered By: Gretta Cool, RN, BSN, Kim on 05/14/2016 14:35:28 Meckler, Annalysse H. ([id number]) -------------------------------------------------------------------------------- Clinic Level of Care Assessment Details Patient Name: [name], [name] Date of Service: 05/14/2016 2:15 PM Medical Record Number: [id number] Patient Account Number: [id number] Date of Birth/Sex: [birth year] (70 y.o. Female) Treating RN: Montey Hora Primary Care Physician: Paulita Cradle Other Clinician: Referring Physician: Paulita Cradle Treating Physician/Extender: Frann Rider in Treatment: 13 Clinic Level of Care Assessment Items TOOL 4 Quantity Score []  - Use when only an EandM is performed on FOLLOW-UP visit 0 ASSESSMENTS - Nursing  Assessment / Reassessment X - Reassessment of Co-morbidities (includes updates in patient status) 1 10 X - Reassessment of Adherence to Treatment Plan 1 5 ASSESSMENTS - Wound and Skin Assessment / Reassessment []  - Simple Wound Assessment / Reassessment - one wound 0 X - Complex Wound Assessment / Reassessment - multiple wounds 4 5 []  - Dermatologic / Skin Assessment (not related to wound area) 0 ASSESSMENTS - Focused Assessment []  - Circumferential Edema Measurements - multi extremities 0 []  - Nutritional Assessment / Counseling / Intervention 0 X - Lower Extremity Assessment (monofilament, tuning fork, pulses) 1 5 []  - Peripheral Arterial Disease Assessment (using hand held doppler) 0 ASSESSMENTS - Ostomy and/or Continence Assessment and Care []  - Incontinence Assessment and Management 0 []  - Ostomy Care Assessment and Management (repouching, etc.) 0 PROCESS - Coordination of Care X - Simple Patient / Family Education for ongoing care 1 15 []  - Complex (extensive) Patient / Family Education for ongoing care 0 []  - Staff obtains Programmer, systems, Records, Test Results / Process Orders 0 []  - Staff telephones HHA, Nursing Homes / Clarify orders / etc 0 []  - Routine Transfer to another Facility (non-emergent condition) 0 [name], [name] ([id number]) []  - Routine Hospital Admission (non-emergent condition) 0 []  - New Admissions / Biomedical engineer / Ordering NPWT, Apligraf, etc. 0 []  - Emergency Hospital Admission (emergent condition) 0 X - Simple Discharge Coordination 1 10 []  - Complex (extensive) Discharge Coordination 0 PROCESS - Special Needs []  - Pediatric / Minor Patient Management 0 []  - Isolation Patient Management 0 []  - Hearing / Language / Visual special needs 0 []  - Assessment of Community assistance (transportation, D/C planning, etc.) 0 []  - Additional assistance / Altered mentation 0 []  - Support Surface(s) Assessment (bed, cushion, seat, etc.) 0 INTERVENTIONS - Wound  Cleansing / Measurement []  - Simple Wound Cleansing - one wound 0 X - Complex Wound Cleansing - multiple wounds 4 5 X - Wound Imaging (photographs -  any number of wounds) 1 5 []  - Wound Tracing (instead of photographs) 0 []  - Simple Wound Measurement - one wound 0 X - Complex Wound Measurement - multiple wounds 4 5 INTERVENTIONS - Wound Dressings X - Small Wound Dressing one or multiple wounds 4 10 []  - Medium Wound Dressing one or multiple wounds 0 []  - Large Wound Dressing one or multiple wounds 0 X - Application of Medications - topical 1 5 []  - Application of Medications - injection 0 INTERVENTIONS - Miscellaneous []  - External ear exam 0 Ostlund, [name] ([id number]) []  - Specimen Collection (cultures, biopsies, blood, body fluids, etc.) 0 []  - Specimen(s) / Culture(s) sent or taken to Lab for analysis 0 []  - Patient Transfer (multiple staff / [name] / Similar devices) 0 []  - Simple Staple / Suture removal (25 or less) 0 []  - Complex Staple / Suture removal (26 or more) 0 []  - Hypo / Hyperglycemic Management (close monitor of Blood Glucose) 0 []  - Ankle / Brachial Index (ABI) - do not check if billed separately 0 X - Vital Signs 1 5 Has the patient been seen at the hospital within the last three years: Yes Total Score: 160 Level Of Care: New/Established - Level 5 Electronic Signature(s) Signed: 05/14/2016 4:27:47 PM By: Montey Hora Entered By: Montey Hora on 05/14/2016 15:24:17 Mcquaig, Anyela H. ([id number]) -------------------------------------------------------------------------------- Encounter Discharge Information Details Patient Name: [name], [name] Date of Service: 05/14/2016 2:15 PM Medical Record Number: [id number] Patient Account Number: [id number] Date of Birth/Sex: [birth year] (70 y.o. Female) Treating RN: Cornell Barman Primary Care Physician: Paulita Cradle Other Clinician: Referring Physician: Paulita Cradle Treating Physician/Extender: Frann Rider in Treatment: 72 Encounter Discharge Information Items Discharge Pain Level: 0 Discharge Condition: Stable Ambulatory Status: Wheelchair Discharge Destination: Home Transportation: Private Auto Accompanied By: husband Schedule Follow-up Appointment: Yes Medication Reconciliation completed Yes and provided to Patient/Care Giovana Faciane: Provided on Clinical Summary of Care: 05/14/2016 Form Type Recipient Paper Patient AS Electronic Signature(s) Signed: 05/14/2016 4:27:47 PM By: Montey Hora Previous Signature: 05/14/2016 3:22:42 PM Version By: Ruthine Dose Entered By: Montey Hora on 05/14/2016 15:29:58 Dominey, Lunabelle H. ([id number]) -------------------------------------------------------------------------------- Lower Extremity Assessment Details Patient Name: Clear, Samra H. Date of Service: 05/14/2016 2:15 PM Medical Record Number: [id number] Patient Account Number: [id number] Date of Birth/Sex: 11/13/1945 (70 y.o. Female) Treating RN: Cornell Barman Primary Care Physician: Paulita Cradle Other Clinician: Referring Physician: Paulita Cradle Treating Physician/Extender: Frann Rider in Treatment: 7 Vascular Assessment Pulses: Posterior Tibial Dorsalis Pedis Palpable: [Left:Yes] [Right:Yes] Extremity colors, hair growth, and conditions: Extremity Color: [Left:Red] [Right:Red] Hair Growth on Extremity: [Left:No] [Right:No] Temperature of Extremity: [Left:Warm] [Right:Warm] Capillary Refill: [Left:> 3 seconds] [Right:> 3 seconds] Dependent Rubor: [Left:No] [Right:No] Blanched when Elevated: [Left:No] [Right:No] Lipodermatosclerosis: [Left:No] [Right:No] Toe Nail Assessment Left: Right: Thick: No No Discolored: No No Deformed: No No Improper Length and Hygiene: No No Notes Toes are edematous bilaterally. Electronic Signature(s) Signed: 05/19/2016 5:55:03 PM By: Gretta Cool, RN, BSN, Kim RN, BSN Entered By: Gretta Cool, RN, BSN, Kim on 05/14/2016 14:42:55 Pongratz,  Ambrosia H. ([id number]) -------------------------------------------------------------------------------- Multi Wound Chart Details Patient Name: [name], Jackquelyn H. Date of Service: 05/14/2016 2:15 PM Medical Record Number: [id number] Patient Account Number: [id number] Date of Birth/Sex: 01/13/1946 (70 y.o. Female) Treating RN: Montey Hora Primary Care Physician: Paulita Cradle Other Clinician: Referring Physician: Paulita Cradle Treating Physician/Extender: Frann Rider in Treatment: 9 Vital Signs Height(in): 65 Pulse(bpm): 111 Weight(lbs): 100 Blood Pressure 102/69 (mmHg): Body Mass Index(BMI): 17 Temperature(F): 98.1 Respiratory Rate 18 (breaths/min):  Photos: Wound Location: Right Calcaneus - Distal Left Calcaneus - Medial Back - Midline Wounding Event: Pressure Injury Pressure Injury Pressure Injury Primary Etiology: Pressure Ulcer Pressure Ulcer Pressure Ulcer Comorbid History: Cataracts, Asthma, Cataracts, Asthma, Cataracts, Asthma, Hypertension, Type II Hypertension, Type II Hypertension, Type II Diabetes, Neuropathy, Diabetes, Neuropathy, Diabetes, Neuropathy, Received Chemotherapy Received Chemotherapy Received Chemotherapy Date Acquired: 09/29/2015 02/18/2016 05/02/2015 Weeks of Treatment: 32 11 54 Wound Status: Open Open Open Clustered Wound: Yes No No Measurements L x W x D 0.9x0.6x0.1 0.6x0.7x0.2 3x4x0.4 (cm) Area (cm) : 0.424 0.33 9.425 Volume (cm) : 0.042 0.066 3.77 % Reduction in Area: 40.00% -423.80% -4184.10% % Reduction in Volume: 40.80% -1000.00% -17036.40% Starting Position 1 10 (o'clock): Ending Position 1 2 (o'clock): Maximum Distance 1 1.5 (cm): Lorentz, Merit H. ([id number]) Undermining: No No Yes Classification: Category/Stage II Category/Stage II Category/Stage IV HBO Classification: Grade 1 Grade 1 N/A Exudate Amount: Small Small Medium Exudate Type: Serous Serous Serous Exudate Color: amber amber amber Wound Margin: Flat and  Intact Flat and Intact Flat and Intact Granulation Amount: Large (67-100%) Large (67-100%) Large (67-100%) Granulation Quality: Pink, Hyper-granulation Red Red, Pink Necrotic Amount: Small (1-33%) None Present (0%) Small (1-33%) Necrotic Tissue: Adherent Slough N/A Eschar, Adherent Slough Exposed Structures: Fascia: No Fascia: No Bone: Yes Fat: No Fat: No Fascia: No Tendon: No Tendon: No Fat: No Muscle: No Muscle: No Tendon: No Joint: No Joint: No Muscle: No Bone: No Bone: No Joint: No Limited to Skin Limited to Skin Breakdown Breakdown Epithelialization: None None None Periwound Skin Texture: Edema: Yes Edema: No Edema: No Excoriation: No Excoriation: No Excoriation: No Induration: No Induration: No Induration: No Callus: No Callus: No Callus: No Crepitus: No Crepitus: No Crepitus: No Fluctuance: No Fluctuance: No Fluctuance: No Friable: No Friable: No Friable: No Rash: No Rash: No Rash: No Scarring: No Scarring: No Scarring: No Periwound Skin Maceration: Yes Maceration: Yes Moist: Yes Moisture: Moist: No Moist: No Maceration: No Dry/Scaly: No Dry/Scaly: No Dry/Scaly: No Periwound Skin Color: Atrophie Blanche: No Atrophie Blanche: No Erythema: Yes Cyanosis: No Cyanosis: No Atrophie Blanche: No Ecchymosis: No Ecchymosis: No Cyanosis: No Erythema: No Erythema: No Ecchymosis: No Hemosiderin Staining: No Hemosiderin Staining: No Hemosiderin Staining: No Mottled: No Mottled: No Mottled: No Pallor: No Pallor: No Pallor: No Rubor: No Rubor: No Rubor: No Erythema Location: N/A N/A Circumferential Temperature: No Abnormality No Abnormality No Abnormality Tenderness on Yes Yes Yes Palpation: Wound Preparation: Ulcer Cleansing: Ulcer Cleansing: Ulcer Cleansing: Rinsed/Irrigated with Rinsed/Irrigated with Rinsed/Irrigated with Saline Saline Saline Topical Anesthetic Topical Anesthetic Topical Anesthetic Aguinaldo, Katia H.  ([id number]) Applied: Other: lidocaine Applied: Other: lidocaine Applied: Other: lidocaine 4% 4% 4% Wound Number: 7 N/A N/A Photos: N/A N/A Wound Location: Right Ischial Tuberosity N/A N/A Wounding Event: Pressure Injury N/A N/A Primary Etiology: Pressure Ulcer N/A N/A Comorbid History: Cataracts, Asthma, N/A N/A Hypertension, Type II Diabetes, Neuropathy, Received Chemotherapy Date Acquired: 08/01/2015 N/A N/A Weeks of Treatment: 40 N/A N/A Wound Status: Open N/A N/A Clustered Wound: No N/A N/A Measurements L x W x D 0.4x0.9x1 N/A N/A (cm) Area (cm) : 0.283 N/A N/A Volume (cm) : 0.283 N/A N/A % Reduction in Area: 91.00% N/A N/A % Reduction in Volume: 9.90% N/A N/A Undermining: N/A N/A N/A Classification: Category/Stage III N/A N/A HBO Classification: N/A N/A N/A Exudate Amount: Medium N/A N/A Exudate Type: Serous N/A N/A Exudate Color: amber N/A N/A Wound Margin: Flat and Intact N/A N/A Granulation Amount: Large (67-100%) N/A N/A Granulation Quality: Pink N/A N/A Necrotic  Amount: None Present (0%) N/A N/A Necrotic Tissue: N/A N/A N/A Exposed Structures: Fascia: No N/A N/A Fat: No Tendon: No Muscle: No Joint: No Bone: No Limited to Skin Breakdown Epithelialization: None N/A N/A Periwound Skin Texture: N/A N/A Myers, Summit H. ([id number]) Edema: No Excoriation: No Induration: No Callus: No Crepitus: No Fluctuance: No Friable: No Rash: No Scarring: No Periwound Skin Moist: Yes N/A N/A Moisture: Maceration: No Dry/Scaly: No Periwound Skin Color: Ecchymosis: Yes N/A N/A Atrophie Blanche: No Cyanosis: No Erythema: No Hemosiderin Staining: No Mottled: No Pallor: No Rubor: No Erythema Location: N/A N/A N/A Temperature: No Abnormality N/A N/A Tenderness on Yes N/A N/A Palpation: Wound Preparation: Ulcer Cleansing: N/A N/A Rinsed/Irrigated with Saline Topical Anesthetic Applied: Other: lidocaine 4% Treatment Notes Electronic  Signature(s) Signed: 05/14/2016 4:27:47 PM By: Montey Hora Entered By: Montey Hora on 05/14/2016 15:03:11 Franta, Suriya H. ([id number]) -------------------------------------------------------------------------------- Multi-Disciplinary Care Plan Details Patient Name: [name], Atziry H. Date of Service: 05/14/2016 2:15 PM Medical Record Number: [id number] Patient Account Number: [id number] Date of Birth/Sex: 1946/08/07 (70 y.o. Female) Treating RN: Montey Hora Primary Care Physician: Paulita Cradle Other Clinician: Referring Physician: Paulita Cradle Treating Physician/Extender: Frann Rider in Treatment: 70 Active Inactive Abuse / Safety / Falls / Self Care Management Nursing Diagnoses: Impaired home maintenance Impaired physical mobility Knowledge deficit related to: safety; personal, health (wound), emergency Potential for falls Self care deficit: actual or potential Goals: Patient will remain injury free Date Initiated: 03/13/2015 Goal Status: Active Patient/caregiver will verbalize understanding of skin care regimen Date Initiated: 03/13/2015 Goal Status: Active Patient/caregiver will verbalize/demonstrate measure taken to improve self care Date Initiated: 03/13/2015 Goal Status: Active Patient/caregiver will verbalize/demonstrate measures taken to improve the patient's personal safety Date Initiated: 03/13/2015 Goal Status: Active Patient/caregiver will verbalize/demonstrate measures taken to prevent injury and/or falls Date Initiated: 03/13/2015 Goal Status: Active Patient/caregiver will verbalize/demonstrate understanding of what to do in case of emergency Date Initiated: 03/13/2015 Goal Status: Active Interventions: Assess fall risk on admission and as needed Assess: immobility, friction, shearing, incontinence upon admission and as needed Assess impairment of mobility on admission and as needed per policy Assess self care needs on admission and as  needed Provide education on basic hygiene Peregoy, [place]. ([id number]) Provide education on fall prevention Provide education on personal and home safety Provide education on safe transfers Treatment Activities: Education provided on Basic Hygiene : 10/23/2015 Notes: Pressure Nursing Diagnoses: Knowledge deficit related to causes and risk factors for pressure ulcer development Knowledge deficit related to management of pressures ulcers Potential for impaired tissue integrity related to pressure, friction, moisture, and shear Goals: Patient will remain free from development of additional pressure ulcers Date Initiated: 03/13/2015 Goal Status: Active Patient will remain free of pressure ulcers Date Initiated: 03/13/2015 Goal Status: Active Patient/caregiver will verbalize risk factors for pressure ulcer development Date Initiated: 03/13/2015 Goal Status: Active Patient/caregiver will verbalize understanding of pressure ulcer management Date Initiated: 03/13/2015 Goal Status: Active Interventions: Assess: immobility, friction, shearing, incontinence upon admission and as needed Assess offloading mechanisms upon admission and as needed Assess potential for pressure ulcer upon admission and as needed Provide education on pressure ulcers Treatment Activities: Patient referred for home evaluation of offloading devices/mattresses : 01/02/2016 Patient referred for pressure reduction/relief devices : 01/02/2016 Patient referred for seating evaluation to ensure proper offloading : 01/02/2016 Pressure reduction/relief device ordered : 01/02/2016 Test ordered outside of clinic : 01/02/2016 Notes: Wound/Skin Impairment Doiron, Shara H. ([id number]) Nursing Diagnoses: Impaired tissue integrity Knowledge deficit related to ulceration/compromised skin integrity  Goals: Patient/caregiver will verbalize understanding of skin care regimen Date Initiated: 03/13/2015 Goal Status: Active Ulcer/skin  breakdown will have a volume reduction of 30% by week 4 Date Initiated: 03/13/2015 Goal Status: Active Ulcer/skin breakdown will have a volume reduction of 50% by week 8 Date Initiated: 03/13/2015 Goal Status: Active Ulcer/skin breakdown will have a volume reduction of 80% by week 12 Date Initiated: 03/13/2015 Goal Status: Active Ulcer/skin breakdown will heal within 14 weeks Date Initiated: 03/13/2015 Goal Status: Active Interventions: Assess patient/caregiver ability to obtain necessary supplies Assess patient/caregiver ability to perform ulcer/skin care regimen upon admission and as needed Assess ulceration(s) every visit Provide education on smoking Provide education on ulcer and skin care Treatment Activities: Patient referred to home care : 01/02/2016 Referred to DME Deontrey Massi for dressing supplies : 01/02/2016 Skin care regimen initiated : 01/02/2016 Topical wound management initiated : 01/02/2016 Notes: Electronic Signature(s) Signed: 05/14/2016 4:27:47 PM By: Montey Hora Entered By: Montey Hora on 05/14/2016 15:02:41 Walck, Alexie H. ([id number]) -------------------------------------------------------------------------------- Pain Assessment Details Patient Name: [name], Aleda H. Date of Service: 05/14/2016 2:15 PM Medical Record Number: [id number] Patient Account Number: [id number] Date of Birth/Sex: 04/01/1946 (70 y.o. Female) Treating RN: Cornell Barman Primary Care Physician: Paulita Cradle Other Clinician: Referring Physician: Paulita Cradle Treating Physician/Extender: Frann Rider in Treatment: 9 Active Problems Location of Pain Severity and Description of Pain Patient Has Paino Yes Site Locations Pain Location: Pain in Ulcers With Dressing Change: Yes Pain Management and Medication Current Pain Management: Goals for Pain Management Patient states her back hurts Notes Topical or injectable lidocaine is offered to patient for acute pain when surgical  debridement is performed. If needed, Patient is instructed to use over the counter pain medication for the following 24-48 hours after debridement. Wound care MDs do not prescribed pain medications. Patient has chronic pain or uncontrolled pain. Patient has been instructed to make an appointment with their Primary Care Physician for pain management. Electronic Signature(s) Signed: 05/19/2016 5:55:03 PM By: Gretta Cool, RN, BSN, Kim RN, BSN Entered By: Gretta Cool, RN, BSN, Kim on 05/14/2016 14:35:59 Arseneault, Lynnet HMarland Kitchen ([id number]) -------------------------------------------------------------------------------- Patient/Caregiver Education Details Patient Name: [name], Nellie H. Date of Service: 05/14/2016 2:15 PM Medical Record Number: [id number] Patient Account Number: [id number] Date of Birth/Gender: [birth year] (70 y.o. Female) Treating RN: Montey Hora Primary Care Physician: Paulita Cradle Other Clinician: Referring Physician: Paulita Cradle Treating Physician/Extender: Frann Rider in Treatment: 57 Education Assessment Education Provided To: Patient Education Topics Provided Wound/Skin Impairment: Handouts: Caring for Your Ulcer, Other: wound care as prescribed Methods: Demonstration, Explain/Verbal Responses: State content correctly Electronic Signature(s) Signed: 05/14/2016 4:27:47 PM By: Montey Hora Entered By: Montey Hora on 05/14/2016 15:30:15 Tomasso, Janilah H. ([id number]) -------------------------------------------------------------------------------- Wound Assessment Details Patient Name: Blankenship, Kalayah H. Date of Service: 05/14/2016 2:15 PM Medical Record Number: [id number] Patient Account Number: [id number] Date of Birth/Sex: 1946-01-07 (70 y.o. Female) Treating RN: Cornell Barman Primary Care Physician: Paulita Cradle Other Clinician: Referring Physician: Paulita Cradle Treating Physician/Extender: Frann Rider in Treatment: 82 Wound Status Wound Number:  11 Primary Pressure Ulcer Etiology: Wound Location: Right Calcaneus - Distal Wound Open Wounding Event: Pressure Injury Status: Date Acquired: 09/29/2015 Comorbid Cataracts, Asthma, Hypertension, Type Weeks Of Treatment: 32 History: II Diabetes, Neuropathy, Received Clustered Wound: Yes Chemotherapy Photos Wound Measurements Length: (cm) 0.9 Width: (cm) 0.6 Depth: (cm) 0.1 Area: (cm) 0.424 Volume: (cm) 0.042 % Reduction in Area: 40% % Reduction in Volume: 40.8% Epithelialization: None Tunneling: No Undermining: No Wound Description Classification: Category/Stage II Foul Odor Af Diabetic Severity (  Wagner): Grade 1 Wound Margin: Flat and Intact Exudate Amount: Small Exudate Type: Serous Exudate Color: amber ter Cleansing: No Wound Bed Granulation Amount: Large (67-100%) Exposed Structure Granulation Quality: Pink, Hyper-granulation Fascia Exposed: No Necrotic Amount: Small (1-33%) Fat Layer Exposed: No Necrotic Quality: Adherent Slough Tendon Exposed: No Christon, Bryley H. ([id number]) Muscle Exposed: No Joint Exposed: No Bone Exposed: No Limited to Skin Breakdown Periwound Skin Texture Texture Color No Abnormalities Noted: No No Abnormalities Noted: No Callus: No Atrophie Blanche: No Crepitus: No Cyanosis: No Excoriation: No Ecchymosis: No Fluctuance: No Erythema: No Friable: No Hemosiderin Staining: No Induration: No Mottled: No Localized Edema: Yes Pallor: No Rash: No Rubor: No Scarring: No Temperature / Pain Moisture Temperature: No Abnormality No Abnormalities Noted: No Tenderness on Palpation: Yes Dry / Scaly: No Maceration: Yes Moist: No Wound Preparation Ulcer Cleansing: Rinsed/Irrigated with Saline Topical Anesthetic Applied: Other: lidocaine 4%, Treatment Notes Wound #11 (Right, Distal Calcaneus) 1. Cleansed with: Clean wound with Normal Saline 2. Anesthetic Topical Lidocaine 4% cream to wound bed prior to debridement 4.  Dressing Applied: Aquacel Ag 5. Secondary Dressing Applied Bordered Foam Dressing Kerlix/Conform Electronic Signature(s) Signed: 05/19/2016 5:55:03 PM By: Gretta Cool, RN, BSN, Kim RN, BSN Entered By: Gretta Cool, RN, BSN, Kim on 05/14/2016 14:51:36 Fenster, Hilaria H. ([id number]) -------------------------------------------------------------------------------- Wound Assessment Details Patient Name: Mcluckie, Khadijatou H. Date of Service: 05/14/2016 2:15 PM Medical Record Number: [id number] Patient Account Number: [id number] Date of Birth/Sex: 1946-08-03 (70 y.o. Female) Treating RN: Cornell Barman Primary Care Physician: Paulita Cradle Other Clinician: Referring Physician: Paulita Cradle Treating Physician/Extender: Frann Rider in Treatment: 68 Wound Status Wound Number: 15 Primary Pressure Ulcer Etiology: Wound Location: Left Calcaneus - Medial Wound Open Wounding Event: Pressure Injury Status: Date Acquired: 02/18/2016 Comorbid Cataracts, Asthma, Hypertension, Type Weeks Of Treatment: 11 History: II Diabetes, Neuropathy, Received Clustered Wound: No Chemotherapy Photos Wound Measurements Length: (cm) 0.6 Width: (cm) 0.7 Depth: (cm) 0.2 Area: (cm) 0.33 Volume: (cm) 0.066 % Reduction in Area: -423.8% % Reduction in Volume: -1000% Epithelialization: None Tunneling: No Undermining: No Wound Description Classification: Category/Stage II Foul Odor A Diabetic Severity (Wagner): Grade 1 Wound Margin: Flat and Intact Exudate Amount: Small Exudate Type: Serous Exudate Color: amber fter Cleansing: No Wound Bed Granulation Amount: Large (67-100%) Exposed Structure Granulation Quality: Red Fascia Exposed: No Necrotic Amount: None Present (0%) Fat Layer Exposed: No Tendon Exposed: No Cocco, Latonda H. ([id number]) Muscle Exposed: No Joint Exposed: No Bone Exposed: No Limited to Skin Breakdown Periwound Skin Texture Texture Color No Abnormalities Noted: No No Abnormalities  Noted: No Callus: No Atrophie Blanche: No Crepitus: No Cyanosis: No Excoriation: No Ecchymosis: No Fluctuance: No Erythema: No Friable: No Hemosiderin Staining: No Induration: No Mottled: No Localized Edema: No Pallor: No Rash: No Rubor: No Scarring: No Temperature / Pain Moisture Temperature: No Abnormality No Abnormalities Noted: No Tenderness on Palpation: Yes Dry / Scaly: No Maceration: Yes Moist: No Wound Preparation Ulcer Cleansing: Rinsed/Irrigated with Saline Topical Anesthetic Applied: Other: lidocaine 4%, Treatment Notes Wound #15 (Left, Medial Calcaneus) 1. Cleansed with: Clean wound with Normal Saline 2. Anesthetic Topical Lidocaine 4% cream to wound bed prior to debridement 4. Dressing Applied: Aquacel Ag 5. Secondary Dressing Applied Bordered Foam Dressing Kerlix/Conform Electronic Signature(s) Signed: 05/19/2016 5:55:03 PM By: Gretta Cool, RN, BSN, Kim RN, BSN Entered By: Gretta Cool, RN, BSN, Kim on 05/14/2016 14:53:26 Atkins, Floye H. ([id number]) -------------------------------------------------------------------------------- Wound Assessment Details Patient Name: Embree, Ivania H. Date of Service: 05/14/2016 2:15 PM Medical Record Number: [id number] Patient Account Number: [id number] Date  of Birth/Sex: 1946-05-17 (70 y.o. Female) Treating RN: Cornell Barman Primary Care Physician: Paulita Cradle Other Clinician: Referring Physician: Paulita Cradle Treating Physician/Extender: Frann Rider in Treatment: 60 Wound Status Wound Number: 3 Primary Pressure Ulcer Etiology: Wound Location: Back - Midline Wound Open Wounding Event: Pressure Injury Status: Date Acquired: 05/02/2015 Comorbid Cataracts, Asthma, Hypertension, Type Weeks Of Treatment: 54 History: II Diabetes, Neuropathy, Received Clustered Wound: No Chemotherapy Photos Wound Measurements Length: (cm) 3 Width: (cm) 4 Depth: (cm) 0.4 Area: (cm) 9.425 Volume: (cm) 3.77 % Reduction  in Area: -4184.1% % Reduction in Volume: -17036.4% Epithelialization: None Undermining: Yes Starting Position (o'clock): 10 Ending Position (o'clock): 2 Maximum Distance: (cm) 1.5 Wound Description Classification: Category/Stage IV Wound Margin: Flat and Intact Exudate Amount: Medium Exudate Type: Serous Exudate Color: amber Wound Bed Granulation Amount: Large (67-100%) Exposed Structure Granulation Quality: Red, Pink Fascia Exposed: No Kober, Sitara H. ([id number]) Necrotic Amount: Small (1-33%) Fat Layer Exposed: No Necrotic Quality: Eschar, Adherent Slough Tendon Exposed: No Muscle Exposed: No Joint Exposed: No Bone Exposed: Yes Periwound Skin Texture Texture Color No Abnormalities Noted: No No Abnormalities Noted: No Callus: No Atrophie Blanche: No Crepitus: No Cyanosis: No Excoriation: No Ecchymosis: No Fluctuance: No Erythema: Yes Friable: No Erythema Location: Circumferential Induration: No Hemosiderin Staining: No Localized Edema: No Mottled: No Rash: No Pallor: No Scarring: No Rubor: No Moisture Temperature / Pain No Abnormalities Noted: No Temperature: No Abnormality Dry / Scaly: No Tenderness on Palpation: Yes Maceration: No Moist: Yes Wound Preparation Ulcer Cleansing: Rinsed/Irrigated with Saline Topical Anesthetic Applied: Other: lidocaine 4%, Treatment Notes Wound #3 (Midline Back) 1. Cleansed with: Clean wound with Normal Saline 2. Anesthetic Topical Lidocaine 4% cream to wound bed prior to debridement 4. Dressing Applied: Santyl Ointment 5. Secondary Dressing Applied Bordered Foam Dressing Electronic Signature(s) Signed: 05/19/2016 5:55:03 PM By: Gretta Cool, RN, BSN, Kim RN, BSN Entered By: Gretta Cool, RN, BSN, Kim on 05/14/2016 14:54:18 Wool, Canna H. ([id number]) -------------------------------------------------------------------------------- Wound Assessment Details Patient Name: Low, Maliah H. Date of Service: 05/14/2016 2:15  PM Medical Record Number: [id number] Patient Account Number: [id number] Date of Birth/Sex: 1946-04-21 (70 y.o. 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 Wound Open Wounding Event: Pressure Injury Status: Date Acquired: 08/01/2015 Comorbid Cataracts, Asthma, Hypertension, Type Weeks Of Treatment: 40 History: II Diabetes, Neuropathy, Received Clustered Wound: No Chemotherapy Photos Wound Measurements Length: (cm) 0.4 Width: (cm) 0.9 Depth: (cm) 1 Area: (cm) 0.283 Volume: (cm) 0.283 % Reduction in Area: 91% % Reduction in Volume: 9.9% Epithelialization: None Wound Description Classification: Category/Stage III Wound Margin: Flat and Intact Exudate Amount: Medium Exudate Type: Serous Exudate Color: amber Foul Odor After Cleansing: No Wound Bed Granulation Amount: Large (67-100%) Exposed Structure Granulation Quality: Pink Fascia Exposed: No Necrotic Amount: None Present (0%) Fat Layer Exposed: No Tendon Exposed: No Muscle Exposed: No Schwimmer, Azyriah H. ([id number]) Joint Exposed: No Bone Exposed: No Limited to Skin Breakdown Periwound Skin Texture Texture Color No Abnormalities Noted: No No Abnormalities Noted: No Callus: No Atrophie Blanche: No Crepitus: No Cyanosis: No Excoriation: No Ecchymosis: Yes Fluctuance: No Erythema: No Friable: No Hemosiderin Staining: No Induration: No Mottled: No Localized Edema: No Pallor: No Rash: No Rubor: No Scarring: No Temperature / Pain Moisture Temperature: No Abnormality No Abnormalities Noted: No Tenderness on Palpation: Yes Dry / Scaly: No Maceration: No Moist: Yes Wound Preparation  Ulcer Cleansing: Rinsed/Irrigated with Saline Topical Anesthetic  Applied: Other: lidocaine 4%, Treatment Notes Wound #7 (Right Ischial Tuberosity) 1. Cleansed with: Clean wound with Normal Saline 2. Anesthetic Topical Lidocaine 4% cream to wound bed prior to debridement 4. Dressing Applied: Santyl Ointment Plain packing gauze 5. Secondary Dressing Applied Bordered Foam Dressing Electronic Signature(s) Signed: 05/19/2016 5:55:03 PM By: Gretta Cool, RN, BSN, Kim RN, BSN Entered By: Gretta Cool, RN, BSN, Kim on 05/14/2016 14:54:56 Sweet, Jamyria H. ([id number]) -------------------------------------------------------------------------------- Vitals Details Patient Name: [name], Kensi H. Date of Service: 05/14/2016 2:15 PM Medical Record Number: [id number] Patient Account Number: [id number] Date of Birth/Sex: 08-31-1946 (70 y.o. Female) Treating RN: Cornell Barman Primary Care Physician: Paulita Cradle Other Clinician: Referring Physician: Paulita Cradle Treating Physician/Extender: Frann Rider in Treatment: 81 Vital Signs Time Taken: 14:36 Temperature (F): 98.1 Height (in): 65 Pulse (bpm): 111 Weight (lbs): 93 Respiratory Rate (breaths/min): 18 Body Mass Index (BMI): 15.5 Blood Pressure (mmHg): 102/69 Reference Range: 80 - 120 mg / dl Electronic Signature(s) Signed: 05/14/2016 4:27:47 PM By: Montey Hora Entered By: Montey Hora on 05/14/2016 15:24:44

## 2016-05-21 ENCOUNTER — Encounter: Payer: Medicare Other | Admitting: Surgery

## 2016-05-21 DIAGNOSIS — E11621 Type 2 diabetes mellitus with foot ulcer: Secondary | ICD-10-CM | POA: Diagnosis not present

## 2016-05-24 NOTE — Progress Notes (Signed)
HULTON, Kamla H. ([id number]) Visit Report for 05/21/2016 Arrival Information Details Patient Name: [name], [name] Date of Service: 05/21/2016 2:15 PM Medical Record Number: [id number] Patient Account Number: [id number] Date of Birth/Sex: 1946-07-03 (70 y.o. Female) Treating RN: Cornell Barman Primary Care Physician: Paulita Cradle Other Clinician: Referring Physician: Paulita Cradle Treating Physician/Extender: Frann Rider in Treatment: 47 Visit Information History Since Last Visit Added or deleted any medications: No Patient Arrived: Wheel Chair Any new allergies or adverse reactions: No Arrival Time: 14:14 Had a fall or experienced change in No activities of daily living that may affect Accompanied By: [name] risk of falls: Transfer Assistance: Manual Signs or symptoms of abuse/neglect since last No Patient Identification Verified: Yes visito Secondary Verification Process Yes Hospitalized since last visit: No Completed: Pain Present Now: Yes Patient Requires Transmission-Based No Precautions: Patient Has Alerts: No Electronic Signature(s) Signed: 05/24/2016 12:18:12 PM By: Gretta Cool, RN, BSN, Kim RN, BSN Entered By: Gretta Cool, RN, BSN, Kim on 05/21/2016 14:27:25 Maneri, Shannie H. ([id number]) -------------------------------------------------------------------------------- Encounter Discharge Information Details Patient Name: [name], [name] Date of Service: 05/21/2016 2:15 PM Medical Record Number: [id number] Patient Account Number: [id number] Date of Birth/Sex: Sep 06, 1946 (70 y.o. Female) Treating RN: Montey Hora Primary Care Physician: Paulita Cradle Other Clinician: Referring Physician: Paulita Cradle Treating Physician/Extender: Frann Rider in Treatment: 33 Encounter Discharge Information Items Discharge Pain Level: 3 Discharge Condition: Unstable Ambulatory Status: Wheelchair Discharge Destination: Home Transportation: Private Auto Accompanied By:  self Schedule Follow-up Appointment: Yes Medication Reconciliation completed Yes and provided to Patient/Care Fannie Gathright: Provided on Clinical Summary of Care: 05/21/2016 Form Type Recipient Paper Patient AS Electronic Signature(s) Signed: 05/24/2016 12:18:12 PM By: Gretta Cool RN, BSN, Kim RN, BSN Previous Signature: 05/21/2016 3:08:38 PM Version By: Ruthine Dose Entered By: Gretta Cool RN, BSN, Kim on 05/21/2016 15:42:28 Helm, Lanyiah H. ([id number]) -------------------------------------------------------------------------------- Lower Extremity Assessment Details Patient Name: Ragle, [name] Date of Service: 05/21/2016 2:15 PM Medical Record Number: [id number] Patient Account Number: [id number] Date of Birth/Sex: 1945-11-29 (70 y.o. Female) Treating RN: Cornell Barman Primary Care Physician: Paulita Cradle Other Clinician: Referring Physician: Paulita Cradle Treating Physician/Extender: Frann Rider in Treatment: 23 Vascular Assessment Claudication: Claudication Assessment [Left:None] [Right:None] Pulses: Posterior Tibial Dorsalis Pedis Palpable: [Left:Yes] [Right:Yes] Extremity colors, hair growth, and conditions: Extremity Color: [Left:Red] [Right:Red] Hair Growth on Extremity: [Left:No] [Right:No] Temperature of Extremity: [Left:Cool] [Right:Warm] Capillary Refill: [Left:> 3 seconds] [Right:> 3 seconds] Dependent Rubor: [Left:No] [Right:No] Blanched when Elevated: [Left:No] [Right:No] Lipodermatosclerosis: [Left:No] [Right:No] Toe Nail Assessment Left: Right: Thick: No No Discolored: No No Deformed: No No Improper Length and Hygiene: No No Notes Toes are edematous bilaterally. Electronic Signature(s) Signed: 05/24/2016 12:18:12 PM By: Gretta Cool, RN, BSN, Kim RN, BSN Entered By: Gretta Cool, RN, BSN, Kim on 05/21/2016 14:34:10 Moch, Sham H. ([id number]) -------------------------------------------------------------------------------- Multi Wound Chart Details Patient Name:  [name], [name] Date of Service: 05/21/2016 2:15 PM Medical Record Number: [id number] Patient Account Number: [id number] Date of Birth/Sex: 16-Nov-1945 (70 y.o. Female) Treating RN: Cornell Barman Primary Care Physician: Paulita Cradle Other Clinician: Referring Physician: Paulita Cradle Treating Physician/Extender: Frann Rider in Treatment: 24 Vital Signs Height(in): 65 Pulse(bpm): 139 Weight(lbs): 93 Blood Pressure 96/66 (mmHg): Body Mass Index(BMI): 15 Temperature(F): 97.8 Respiratory Rate 16 (breaths/min): Photos: Wound Location: Right Calcaneus - Distal Left Calcaneus - Medial Back - Midline Wounding Event: Pressure Injury Pressure Injury Pressure Injury Primary Etiology: Pressure Ulcer Pressure Ulcer Pressure Ulcer Comorbid History: Cataracts, Asthma, Cataracts, Asthma, Cataracts, Asthma, Hypertension, Type II Hypertension, Type II Hypertension, Type II Diabetes, Neuropathy, Diabetes, Neuropathy,  Diabetes, Neuropathy, Received Chemotherapy Received Chemotherapy Received Chemotherapy Date Acquired: 09/29/2015 02/18/2016 05/02/2015 Weeks of Treatment: 33 12 55 Wound Status: Open Open Open Clustered Wound: Yes No No Measurements L x W x D 0.2x0.1x0.1 0.2x0.5x0.2 4.5x4.2x0.3 (cm) Area (cm) : 0.016 0.079 14.844 Volume (cm) : 0.002 0.016 4.453 % Reduction in Area: 97.70% -25.40% -6647.30% % Reduction in Volume: 97.20% -166.70% -20140.90% Starting Position 1 10 (o'clock): Ending Position 1 2 (o'clock): Maximum Distance 1 1.5 (cm): Havel, [name] ([id number]) Undermining: No N/A Yes Classification: Category/Stage II Category/Stage II Category/Stage IV HBO Classification: Grade 1 Grade 1 N/A Exudate Amount: Small Small Medium Exudate Type: Serous Serous Serous Exudate Color: amber amber amber Wound Margin: Flat and Intact Flat and Intact Flat and Intact Granulation Amount: Large (67-100%) Large (67-100%) Large (67-100%) Granulation Quality: Pink,  Hyper-granulation Red Red, Pink Necrotic Amount: Small (1-33%) None Present (0%) Small (1-33%) Necrotic Tissue: Adherent Slough N/A Eschar, Adherent Slough Exposed Structures: Fascia: No Fascia: No Bone: Yes Fat: No Fat: No Fascia: No Tendon: No Tendon: No Fat: No Muscle: No Muscle: No Tendon: No Joint: No Joint: No Muscle: No Bone: No Bone: No Joint: No Limited to Skin Limited to Skin Breakdown Breakdown Epithelialization: None None None Periwound Skin Texture: Edema: Yes Edema: No Edema: No Excoriation: No Excoriation: No Excoriation: No Induration: No Induration: No Induration: No Callus: No Callus: No Callus: No Crepitus: No Crepitus: No Crepitus: No Fluctuance: No Fluctuance: No Fluctuance: No Friable: No Friable: No Friable: No Rash: No Rash: No Rash: No Scarring: No Scarring: No Scarring: No Periwound Skin Maceration: No Maceration: Yes Moist: Yes Moisture: Moist: No Moist: No Maceration: No Dry/Scaly: No Dry/Scaly: No Dry/Scaly: No Periwound Skin Color: Atrophie Blanche: No Atrophie Blanche: No Erythema: Yes Cyanosis: No Cyanosis: No Atrophie Blanche: No Ecchymosis: No Ecchymosis: No Cyanosis: No Erythema: No Erythema: No Ecchymosis: No Hemosiderin Staining: No Hemosiderin Staining: No Hemosiderin Staining: No Mottled: No Mottled: No Mottled: No Pallor: No Pallor: No Pallor: No Rubor: No Rubor: No Rubor: No Erythema Location: N/A N/A Circumferential Temperature: No Abnormality No Abnormality No Abnormality Tenderness on Yes Yes Yes Palpation: Wound Preparation: Ulcer Cleansing: Ulcer Cleansing: Ulcer Cleansing: Rinsed/Irrigated with Rinsed/Irrigated with Rinsed/Irrigated with Saline Saline Saline Topical Anesthetic Topical Anesthetic Topical Anesthetic Herberg, Jalayna H. ([id number]) Applied: None, Other: Applied: Other: lidocaine Applied: Other: lidocaine lidocaine 4% 4% 4% Wound Number: 7 N/A N/A Photos: N/A  N/A Wound Location: Right Ischial Tuberosity N/A N/A Wounding Event: Pressure Injury N/A N/A Primary Etiology: Pressure Ulcer N/A N/A Comorbid History: Cataracts, Asthma, N/A N/A Hypertension, Type II Diabetes, Neuropathy, Received Chemotherapy Date Acquired: 08/01/2015 N/A N/A Weeks of Treatment: 41 N/A N/A Wound Status: Open N/A N/A Clustered Wound: No N/A N/A Measurements L x W x D 0.7x0.4x1.2 N/A N/A (cm) Area (cm) : 0.22 N/A N/A Volume (cm) : 0.264 N/A N/A % Reduction in Area: 93.00% N/A N/A % Reduction in Volume: 15.90% N/A N/A Starting Position 1 12 (o'clock): Ending Position 1 12 (o'clock): Maximum Distance 1 0.3 (cm): Undermining: Yes N/A N/A Classification: Category/Stage III N/A N/A HBO Classification: N/A N/A N/A Exudate Amount: Large N/A N/A Exudate Type: Serous N/A N/A Exudate Color: amber N/A N/A Wound Margin: Flat and Intact N/A N/A Granulation Amount: Medium (34-66%) N/A N/A Granulation Quality: Pink N/A N/A Necrotic Amount: Medium (34-66%) N/A N/A Necrotic Tissue: Adherent Slough N/A N/A Exposed Structures: Fascia: No N/A N/A Fat: No Tendon: No Muscle: No Maslin, Amrie H. ([id number]) Joint: No Bone: No Limited to Skin Breakdown  Epithelialization: None N/A N/A Periwound Skin Texture: Edema: No N/A N/A Excoriation: No Induration: No Callus: No Crepitus: No Fluctuance: No Friable: No Rash: No Scarring: No Periwound Skin Moist: Yes N/A N/A Moisture: Maceration: No Dry/Scaly: No Periwound Skin Color: Erythema: Yes N/A N/A Atrophie Blanche: No Cyanosis: No Ecchymosis: No Hemosiderin Staining: No Mottled: No Pallor: No Rubor: No Erythema Location: Circumferential N/A N/A Temperature: No Abnormality N/A N/A Tenderness on Yes N/A N/A Palpation: Wound Preparation: Ulcer Cleansing: N/A N/A Rinsed/Irrigated with Saline Topical Anesthetic Applied: Other: lidocaine 4% Treatment Notes Electronic Signature(s) Signed: 05/24/2016  12:18:12 PM By: Gretta Cool, RN, BSN, Kim RN, BSN Entered By: Gretta Cool, RN, BSN, Kim on 05/21/2016 14:44:10 Plyler, Tarisa HMarland Kitchen ([id number]) -------------------------------------------------------------------------------- Multi-Disciplinary Care Plan Details Patient Name: [name], Cash H. Date of Service: 05/21/2016 2:15 PM Medical Record Number: [id number] Patient Account Number: [id number] Date of Birth/Sex: 1945/10/22 (70 y.o. Female) Treating RN: Cornell Barman Primary Care Physician: Paulita Cradle Other Clinician: Referring Physician: Paulita Cradle Treating Physician/Extender: Frann Rider in Treatment: 34 Active Inactive Abuse / Safety / Falls / Self Care Management Nursing Diagnoses: Impaired home maintenance Impaired physical mobility Knowledge deficit related to: safety; personal, health (wound), emergency Potential for falls Self care deficit: actual or potential Goals: Patient will remain injury free Date Initiated: 03/13/2015 Goal Status: Active Patient/caregiver will verbalize understanding of skin care regimen Date Initiated: 03/13/2015 Goal Status: Active Patient/caregiver will verbalize/demonstrate measure taken to improve self care Date Initiated: 03/13/2015 Goal Status: Active Patient/caregiver will verbalize/demonstrate measures taken to improve the patient's personal safety Date Initiated: 03/13/2015 Goal Status: Active Patient/caregiver will verbalize/demonstrate measures taken to prevent injury and/or falls Date Initiated: 03/13/2015 Goal Status: Active Patient/caregiver will verbalize/demonstrate understanding of what to do in case of emergency Date Initiated: 03/13/2015 Goal Status: Active Interventions: Assess fall risk on admission and as needed Assess: immobility, friction, shearing, incontinence upon admission and as needed Assess impairment of mobility on admission and as needed per policy Assess self care needs on admission and as needed Provide  education on basic hygiene Lavallie, [place]. ([id number]) Provide education on fall prevention Provide education on personal and home safety Provide education on safe transfers Treatment Activities: Education provided on Basic Hygiene : 08/05/2015 Notes: Pressure Nursing Diagnoses: Knowledge deficit related to causes and risk factors for pressure ulcer development Knowledge deficit related to management of pressures ulcers Potential for impaired tissue integrity related to pressure, friction, moisture, and shear Goals: Patient will remain free from development of additional pressure ulcers Date Initiated: 03/13/2015 Goal Status: Active Patient will remain free of pressure ulcers Date Initiated: 03/13/2015 Goal Status: Active Patient/caregiver will verbalize risk factors for pressure ulcer development Date Initiated: 03/13/2015 Goal Status: Active Patient/caregiver will verbalize understanding of pressure ulcer management Date Initiated: 03/13/2015 Goal Status: Active Interventions: Assess: immobility, friction, shearing, incontinence upon admission and as needed Assess offloading mechanisms upon admission and as needed Assess potential for pressure ulcer upon admission and as needed Provide education on pressure ulcers Treatment Activities: Patient referred for home evaluation of offloading devices/mattresses : 01/02/2016 Patient referred for pressure reduction/relief devices : 01/02/2016 Patient referred for seating evaluation to ensure proper offloading : 01/02/2016 Pressure reduction/relief device ordered : 01/02/2016 Test ordered outside of clinic : 01/02/2016 Notes: Wound/Skin Impairment Buehrer, Cherlynn H. ([id number]) Nursing Diagnoses: Impaired tissue integrity Knowledge deficit related to ulceration/compromised skin integrity Goals: Patient/caregiver will verbalize understanding of skin care regimen Date Initiated: 03/13/2015 Goal Status: Active Ulcer/skin breakdown will have a  volume reduction of 30% by week 4 Date  Initiated: 03/13/2015 Goal Status: Active Ulcer/skin breakdown will have a volume reduction of 50% by week 8 Date Initiated: 03/13/2015 Goal Status: Active Ulcer/skin breakdown will have a volume reduction of 80% by week 12 Date Initiated: 03/13/2015 Goal Status: Active Ulcer/skin breakdown will heal within 14 weeks Date Initiated: 03/13/2015 Goal Status: Active Interventions: Assess patient/caregiver ability to obtain necessary supplies Assess patient/caregiver ability to perform ulcer/skin care regimen upon admission and as needed Assess ulceration(s) every visit Provide education on smoking Provide education on ulcer and skin care Treatment Activities: Patient referred to home care : 01/02/2016 Referred to DME Emmauel Hallums for dressing supplies : 01/02/2016 Skin care regimen initiated : 01/02/2016 Topical wound management initiated : 01/02/2016 Notes: Electronic Signature(s) Signed: 05/24/2016 12:18:12 PM By: Gretta Cool, RN, BSN, Kim RN, BSN Entered By: Gretta Cool, RN, BSN, Kim on 05/21/2016 14:43:49 Salak, Antonietta H. ([id number]) -------------------------------------------------------------------------------- Pain Assessment Details Patient Name: Charo, Katheen H. Date of Service: 05/21/2016 2:15 PM Medical Record Number: [id number] Patient Account Number: [id number] Date of Birth/Sex: [birth year] (70 y.o. Female) Treating RN: Cornell Barman Primary Care Physician: Paulita Cradle Other Clinician: Referring Physician: Paulita Cradle Treating Physician/Extender: Frann Rider in Treatment: 70 Active Problems Location of Pain Severity and Description of Pain Patient Has Paino Yes Site Locations Pain Location: Generalized Pain With Dressing Change: Yes Pain Management and Medication Current Pain Management: Notes Topical or injectable lidocaine is offered to patient for acute pain when surgical debridement is performed. If needed, Patient is  instructed to use over the counter pain medication for the following 24-48 hours after debridement. Wound care MDs do not prescribed pain medications. Patient has chronic pain or uncontrolled pain. Patient has been instructed to make an appointment with their Primary Care Physician for pain management. Electronic Signature(s) Signed: 05/24/2016 12:18:12 PM By: Gretta Cool, RN, BSN, Kim RN, BSN Entered By: Gretta Cool, RN, BSN, Kim on 05/21/2016 14:27:46 Ibsen, Amori HMarland Kitchen ([id number]) -------------------------------------------------------------------------------- Patient/Caregiver Education Details Patient Name: [name], Seleena H. Date of Service: 05/21/2016 2:15 PM Medical Record Number: [id number] Patient Account Number: [id number] Date of Birth/Gender: [birth year] (70 y.o. Female) Treating RN: Cornell Barman Primary Care Physician: Paulita Cradle Other Clinician: Referring Physician: Paulita Cradle Treating Physician/Extender: Frann Rider in Treatment: 88 Education Assessment Education Provided To: Patient Education Topics Provided Wound/Skin Impairment: Handouts: Caring for Your Ulcer, Other: continue wound care as prescribed Methods: Demonstration Responses: State content correctly Electronic Signature(s) Signed: 05/24/2016 12:18:12 PM By: Gretta Cool, RN, BSN, Kim RN, BSN Entered By: Gretta Cool, RN, BSN, Kim on 05/21/2016 15:43:04 Tennyson, Shawna H. ([id number]) -------------------------------------------------------------------------------- Wound Assessment Details Patient Name: Seher, Issa H. Date of Service: 05/21/2016 2:15 PM Medical Record Number: [id number] Patient Account Number: [id number] Date of Birth/Sex: [birth year] (70 y.o. Female) Treating RN: Cornell Barman Primary Care Physician: Paulita Cradle Other Clinician: Referring Physician: Paulita Cradle Treating Physician/Extender: Frann Rider in Treatment: 44 Wound Status Wound Number: 11 Primary Pressure  Ulcer Etiology: Wound Location: Right Calcaneus - Distal Wound Open Wounding Event: Pressure Injury Status: Date Acquired: 09/29/2015 Comorbid Cataracts, Asthma, Hypertension, Type Weeks Of Treatment: 33 History: II Diabetes, Neuropathy, Received Clustered Wound: Yes Chemotherapy Photos Wound Measurements Length: (cm) 0.2 Width: (cm) 0.1 Depth: (cm) 0.1 Area: (cm) 0.016 Volume: (cm) 0.002 % Reduction in Area: 97.7% % Reduction in Volume: 97.2% Epithelialization: None Tunneling: No Undermining: No Wound Description Classification: Category/Stage II Foul Odor Af Diabetic Severity Earleen Newport): Grade 1 Wound Margin: Flat and Intact Exudate Amount: Small Exudate Type: Serous Exudate Color: amber ter Cleansing: No Wound Bed Granulation Amount: Large (67-100%) Exposed  Structure Granulation Quality: Pink, Hyper-granulation Fascia Exposed: No Necrotic Amount: Small (1-33%) Fat Layer Exposed: No Necrotic Quality: Adherent Slough Tendon Exposed: No Jackson, Omolara H. ([id number]) Muscle Exposed: No Joint Exposed: No Bone Exposed: No Limited to Skin Breakdown Periwound Skin Texture Texture Color No Abnormalities Noted: No No Abnormalities Noted: No Callus: No Atrophie Blanche: No Crepitus: No Cyanosis: No Excoriation: No Ecchymosis: No Fluctuance: No Erythema: No Friable: No Hemosiderin Staining: No Induration: No Mottled: No Localized Edema: Yes Pallor: No Rash: No Rubor: No Scarring: No Temperature / Pain Moisture Temperature: No Abnormality No Abnormalities Noted: No Tenderness on Palpation: Yes Dry / Scaly: No Maceration: No Moist: No Wound Preparation Ulcer Cleansing: Rinsed/Irrigated with Saline Topical Anesthetic Applied: None, Other: lidocaine 4%, Treatment Notes Wound #11 (Right, Distal Calcaneus) 1. Cleansed with: Clean wound with Normal Saline 5. Secondary Dressing Applied Bordered Foam Dressing Electronic Signature(s) Signed: 05/24/2016  12:18:12 PM By: Gretta Cool, RN, BSN, Kim RN, BSN Entered By: Gretta Cool, RN, BSN, Kim on 05/21/2016 14:41:36 [place], [place]. ([id number]) -------------------------------------------------------------------------------- Wound Assessment Details Patient Name: Reesman, Shaniquia H. Date of Service: 05/21/2016 2:15 PM Medical Record Number: [id number] Patient Account Number: [id number] Date of Birth/Sex: [birth year] (70 y.o. Female) Treating RN: Cornell Barman Primary Care Physician: Paulita Cradle Other Clinician: Referring Physician: Paulita Cradle Treating Physician/Extender: Frann Rider in Treatment: 68 Wound Status Wound Number: 15 Primary Pressure Ulcer Etiology: Wound Location: Left Calcaneus - Medial Wound Open Wounding Event: Pressure Injury Status: Date Acquired: 02/18/2016 Comorbid Cataracts, Asthma, Hypertension, Type Weeks Of Treatment: 12 History: II Diabetes, Neuropathy, Received Clustered Wound: No Chemotherapy Photos Wound Measurements Length: (cm) 0.2 Width: (cm) 0.5 Depth: (cm) 0.2 Area: (cm) 0.079 Volume: (cm) 0.016 % Reduction in Area: -25.4% % Reduction in Volume: -166.7% Epithelialization: None Wound Description Classification: Category/Stage II Foul Odor A Diabetic Severity (Wagner): Grade 1 Wound Margin: Flat and Intact Exudate Amount: Small Exudate Type: Serous Exudate Color: amber fter Cleansing: No Wound Bed Granulation Amount: Large (67-100%) Exposed Structure Granulation Quality: Red Fascia Exposed: No Necrotic Amount: None Present (0%) Fat Layer Exposed: No Tendon Exposed: No Knoth, Tamsin H. ([id number]) Muscle Exposed: No Joint Exposed: No Bone Exposed: No Limited to Skin Breakdown Periwound Skin Texture Texture Color No Abnormalities Noted: No No Abnormalities Noted: No Callus: No Atrophie Blanche: No Crepitus: No Cyanosis: No Excoriation: No Ecchymosis: No Fluctuance: No Erythema: No Friable: No Hemosiderin Staining:  No Induration: No Mottled: No Localized Edema: No Pallor: No Rash: No Rubor: No Scarring: No Temperature / Pain Moisture Temperature: No Abnormality No Abnormalities Noted: No Tenderness on Palpation: Yes Dry / Scaly: No Maceration: Yes Moist: No Wound Preparation Ulcer Cleansing: Rinsed/Irrigated with Saline Topical Anesthetic Applied: Other: lidocaine 4%, Treatment Notes Wound #15 (Left, Medial Calcaneus) 1. Cleansed with: Clean wound with Normal Saline 2. Anesthetic Topical Lidocaine 4% cream to wound bed prior to debridement 4. Dressing Applied: Aquacel Ag 5. Secondary Dressing Applied Bordered Foam Dressing Electronic Signature(s) Signed: 05/24/2016 12:18:12 PM By: Gretta Cool, RN, BSN, Kim RN, BSN Entered By: Gretta Cool, RN, BSN, Kim on 05/21/2016 14:42:43 [place], [place]. ([id number]) -------------------------------------------------------------------------------- Wound Assessment Details Patient Name: Mahany, Lupe H. Date of Service: 05/21/2016 2:15 PM Medical Record Number: [id number] Patient Account Number: [id number] Date of Birth/Sex: 1946-05-23 (70 y.o. Female) Treating RN: Cornell Barman Primary Care Physician: Paulita Cradle Other Clinician: Referring Physician: Paulita Cradle Treating Physician/Extender: Frann Rider in Treatment: 62 Wound Status Wound Number: 3 Primary Pressure Ulcer Etiology: Wound Location: Back - Midline Wound Open Wounding Event: Pressure Injury  Status: Date Acquired: 05/02/2015 Comorbid Cataracts, Asthma, Hypertension, Type Weeks Of Treatment: 55 History: II Diabetes, Neuropathy, Received Clustered Wound: No Chemotherapy Photos Wound Measurements Length: (cm) 4.5 Width: (cm) 4.2 Depth: (cm) 0.3 Area: (cm) 14.844 Volume: (cm) 4.453 % Reduction in Area: -6647.3% % Reduction in Volume: -20140.9% Epithelialization: None Undermining: Yes Starting Position (o'clock): 10 Ending Position (o'clock): 2 Maximum Distance:  (cm) 1.5 Wound Description Classification: Category/Stage IV Wound Margin: Flat and Intact Exudate Amount: Medium Exudate Type: Serous Exudate Color: amber Wound Bed Granulation Amount: Large (67-100%) Exposed Structure Granulation Quality: Red, Pink Fascia Exposed: No Heggie, Chanler H. ([id number]) Necrotic Amount: Small (1-33%) Fat Layer Exposed: No Necrotic Quality: Eschar, Adherent Slough Tendon Exposed: No Muscle Exposed: No Joint Exposed: No Bone Exposed: Yes Periwound Skin Texture Texture Color No Abnormalities Noted: No No Abnormalities Noted: No Callus: No Atrophie Blanche: No Crepitus: No Cyanosis: No Excoriation: No Ecchymosis: No Fluctuance: No Erythema: Yes Friable: No Erythema Location: Circumferential Induration: No Hemosiderin Staining: No Localized Edema: No Mottled: No Rash: No Pallor: No Scarring: No Rubor: No Moisture Temperature / Pain No Abnormalities Noted: No Temperature: No Abnormality Dry / Scaly: No Tenderness on Palpation: Yes Maceration: No Moist: Yes Wound Preparation Ulcer Cleansing: Rinsed/Irrigated with Saline Topical Anesthetic Applied: Other: lidocaine 4%, Treatment Notes Wound #3 (Midline Back) 1. Cleansed with: Clean wound with Normal Saline 2. Anesthetic Topical Lidocaine 4% cream to wound bed prior to debridement 4. Dressing Applied: Aquacel Ag 5. Secondary Dressing Applied Bordered Foam Dressing Electronic Signature(s) Signed: 05/24/2016 12:18:12 PM By: Gretta Cool, RN, BSN, Kim RN, BSN Entered By: Gretta Cool, RN, BSN, Kim on 05/21/2016 14:40:41 Fehr, Zinnia H. ([id number]) -------------------------------------------------------------------------------- Wound Assessment Details Patient Name: Saylor, Zyra H. Date of Service: 05/21/2016 2:15 PM Medical Record Number: [id number] Patient Account Number: [id number] Date of Birth/Sex: 1945-09-21 (70 y.o. 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 Wound Open Wounding Event: Pressure Injury Status: Date Acquired: 08/01/2015 Comorbid Cataracts, Asthma, Hypertension, Type Weeks Of Treatment: 41 History: II Diabetes, Neuropathy, Received Clustered Wound: No Chemotherapy Photos Wound Measurements Length: (cm) 0.7 Width: (cm) 0.4 Depth: (cm) 1.2 Area: (cm) 0.22 Volume: (cm) 0.264 % Reduction in Area: 93% % Reduction in Volume: 15.9% Epithelialization: None Tunneling: No Undermining: Yes Starting Position (o'clock): 12 Ending Position (o'clock): 12 Maximum Distance: (cm) 0.3 Wound Description Classification: Category/Stage III Wound Margin: Flat and Intact Exudate Amount: Large Exudate Type: Serous Exudate Color: amber Foul Odor After Cleansing: No Wound Bed Granulation Amount: Medium (34-66%) Exposed Structure Steidle, Alesana H. ([id number]) Granulation Quality: Pink Fascia Exposed: No Necrotic Amount: Medium (34-66%) Fat Layer Exposed: No Necrotic Quality: Adherent Slough Tendon Exposed: No Muscle Exposed: No Joint Exposed: No Bone Exposed: No Limited to Skin Breakdown Periwound Skin Texture Texture Color No Abnormalities Noted: No No Abnormalities Noted: No Callus: No Atrophie Blanche: No Crepitus: No Cyanosis: No Excoriation: No Ecchymosis: No Fluctuance: No Erythema: Yes Friable: No Erythema Location: Circumferential Induration: No Hemosiderin Staining: No Localized Edema: No Mottled: No Rash: No Pallor: No Scarring: No Rubor: No Moisture Temperature / Pain No Abnormalities Noted: No Temperature: No Abnormality Dry / Scaly: No Tenderness on Palpation: Yes Maceration: No Moist: Yes Wound Preparation Ulcer Cleansing: Rinsed/Irrigated with  Saline Topical Anesthetic Applied: Other: lidocaine 4%, Treatment Notes Wound #7 (Right Ischial Tuberosity) 1. Cleansed with: Clean wound with Normal Saline  2. Anesthetic Topical Lidocaine 4% cream to wound bed prior to debridement 4. Dressing Applied: Santyl Ointment Other dressing (specify in notes) 5. Secondary Dressing Applied Bordered Foam Dressing Notes santyl coated gauze Electronic Signature(s) Signed: 05/24/2016 12:18:12 PM By: Gretta Cool, RN, BSN, Kim RN, BSN Magda, Lenia H. ([id number]) Entered By: Gretta Cool, RN, BSN, Kim on 05/21/2016 14:43:31 Spease, Deondra H. ([id number]) -------------------------------------------------------------------------------- Vitals Details Patient Name: [name], Johnnetta H. Date of Service: 05/21/2016 2:15 PM Medical Record Number: [id number] Patient Account Number: [id number] Date of Birth/Sex: 1945-10-06 (70 y.o. Female) Treating RN: Cornell Barman Primary Care Physician: Paulita Cradle Other Clinician: Referring Physician: Paulita Cradle Treating Physician/Extender: Frann Rider in Treatment: 51 Vital Signs Time Taken: 14:27 Temperature (F): 97.8 Height (in): 65 Pulse (bpm): 139 Weight (lbs): 93 Respiratory Rate (breaths/min): 16 Body Mass Index (BMI): 15.5 Blood Pressure (mmHg): 96/66 Reference Range: 80 - 120 mg / dl Electronic Signature(s) Signed: 05/24/2016 12:18:12 PM By: Gretta Cool, RN, BSN, Kim RN, BSN Entered By: Gretta Cool, RN, BSN, Kim on 05/21/2016 14:28:07

## 2016-05-24 NOTE — Progress Notes (Signed)
[name], Smt H. ([id number]) Visit Report for 05/21/2016 Chief Complaint Document Details Patient Name: [name], [name] Date of Service: 05/21/2016 2:15 PM Medical Record Number: [id number] Patient Account Number: [id number] Date of Birth/Sex: 12/08/1945 (70 y.o. Female) Treating RN: Montey Hora Primary Care Physician: Paulita Cradle Other Clinician: Referring Physician: Paulita Cradle Treating Physician/Extender: Frann Rider in Treatment: 17 Information Obtained from: Patient Chief Complaint Patient presents to the wound care center for a consult due non healing wound. Ulcers on the right elbow and the right heel for about 1 month. Electronic Signature(s) Signed: 05/21/2016 2:59:30 PM By: Christin Fudge MD, FACS Entered By: Christin Fudge on 05/21/2016 14:59:29 Escue, Staceyann H. ([id number]) -------------------------------------------------------------------------------- Debridement Details Patient Name: [name], [name] Date of Service: 05/21/2016 2:15 PM Medical Record Number: [id number] Patient Account Number: [id number] Date of Birth/Sex: 12/24/1945 (70 y.o. Female) Treating RN: Montey Hora Primary Care Physician: Paulita Cradle Other Clinician: Referring Physician: Paulita Cradle Treating Physician/Extender: Frann Rider in Treatment: 62 Debridement Performed for Wound #3 Midline Back Assessment: Performed By: Physician Christin Fudge, MD Debridement: Debridement Pre-procedure Yes - 14:45 Verification/Time Out Taken: Start Time: 14:46 Pain Control: Other : lidocaine 4% cream Level: Skin/Subcutaneous Tissue Total Area Debrided (L x 0.8 (cm) x 0.9 (cm) = 0.72 (cm) W): Tissue and other Non-Viable, Exudate, Fibrin/Slough, Subcutaneous material debrided: Instrument: Forceps, Scissors Bleeding: Minimum Hemostasis Achieved: Pressure End Time: 14:50 Procedural Pain: 3 Post Procedural Pain: 3 Response to Treatment: Procedure was tolerated  well Post Debridement Measurements of Total Wound Length: (cm) 0.8 Stage: Category/Stage IV Width: (cm) 0.9 Depth: (cm) 0.1 Volume: (cm) 0.057 Character of Wound/Ulcer Post Requires Further Debridement: Debridement Severity of Tissue Post Fat layer exposed Debridement: Post Procedure Diagnosis Same as Pre-procedure Electronic Signature(s) Signed: 05/21/2016 2:59:20 PM By: Christin Fudge MD, FACS Signed: 05/21/2016 4:21:46 PM By: Montey Hora Entered By: Christin Fudge on 05/21/2016 14:59:19 Szymanowski, Ajane H. ([id number]) Bowerman, Loan H. ([id number]) -------------------------------------------------------------------------------- HPI Details Patient Name: Genther, [name] Date of Service: 05/21/2016 2:15 PM Medical Record Number: [id number] Patient Account Number: [id number] Date of Birth/Sex: 06-15-1946 (70 y.o. Female) Treating RN: Montey Hora Primary Care Physician: Paulita Cradle Other Clinician: Referring Physician: Paulita Cradle Treating Physician/Extender: Frann Rider in Treatment: 25 History of Present Illness Location: Ulceration on the right heel and the right elbow. Quality: Patient reports experiencing a dull pain to affected area(s). Severity: Patient states wound (s) are getting better. Duration: Patient has had the wound for < 4 weeks prior to presenting for treatment Timing: Pain in wound is Intermittent (comes and goes Context: The wound appeared gradually over time Modifying Factors: Consults to this date include:Augmentin and Bactrim and also some heel protection with duoderm Associated Signs and Symptoms: Patient reports having difficulty standing for long periods. HPI Description: 69 year old female with history of peripheral neuropathy, history of diet controlled diabetes mellitus type 2, history of alcoholism here for wound consult sent by her PCP Dr. Sherilyn [name]. She has pressure ulcers at her right elbow, bilateral heels. Plain films of right  calcaneus without acute bony process. Patient started by PCP on Augmentin, Bactrim as per orders, DuoDerm dressings applied - reports some improvement in her ulcer since last seen. Denies fever, chills, nausea, vomiting, diarrhea. She had a right humerus fracture in the middle of May and has had no surgery and arm is in a sling. She is also been laying in the bed for quite a while. Past medical history significant for essential hypertension, osteoporosis, peripheral neuropathy, alcoholism, ataxia, personal history of  breast cancer treated with surgery chemotherapy and radiation and this was done in December 2010. she is also status post laparoscopic cholecystectomy, pilonidal cyst excision, subcutaneous port placement, partial mastectomy on the left side, skin cancer removal. 03/21/2015 -- she says overall she's been doing better and continues to smoke about 15 cigarettes a day. 03/21/2015 - her orthopedic doctor has said she may require surgery for her right humerus fracture. 04/04/2015 -- her orthopedic surgery has been scheduled for August 11. 04/18/2015 -- she is doing fine as far as her elbow and her right heel goes but she has developed some redness over prominence on her thoracic spine and wanted me to take a look at this. 05/02/2015 -- she had her surgery done and now is in a sling and support. Her back has developed a pressure injury of undetermined stage. She seems to be in better spirits. 05/16/2015 -- last week her right heel was looking great and we had healed it out, but she has not been offloading appropriately and has a deep tissue injury on the right heel again. The area on her right elbow has opened out with slough and the area in the thoracic spine is also getting worse. 05/30/2015 -- she has developed 2 new ulcerations one on her left ischial tuberosity and one on the sacral region. She is still working on getting up smoking but is also unable to take her vitamins as she says  she develops a diarrhea when she takes vitamins. She has increased her intake of proteins. 06/06/2015 -- the patient's husband manages to get her a low air loss mattress with initiating pressure but Mundt, [name] ([id number]) did not know how to use it exactly and the patient was not happy about using it. Overall she says she's been feeling better. 07/11/2015 -- . Discussed a surgical opinion for debridement and application of a wound VAC, 2 weeks ago but the patient has been reluctant to get a surgical opinion as she wants to avoid surgery. 07/18/2015 -- they have an appointment to see Dr. Tamala Julian this coming Wednesday. 07/29/2015 -- they saw Dr. Tamala Julian in his office and he did a debridement of the wound and removed significant amount of slough. This is in addition to the debridement I had done previously on Friday where a large amount of the eschar was removed. He did not recommend the application of wound VAC. Addendum : Dr. Thompson Caul note was reviewed via EPIC and details noted as above. 08/05/2015 -- over the last week she has developed a another pressure injury to her right hip and has had significant discoloration of the skin and an eschar there. 08/15/2015 -- she is still smoking about a pack of cigarettes a day and does not seem to want to quit. They have not been able to talk to the vendor regarding the air mattress and I will ask them to get in touch again. She is reluctant to take vitamins and does admit her nutrition is poor. 08/22/2015 -- she has been unable to tolerate her vitamins and continues to smoke. They're working on getting a low air loss mattress and have been speaking with the vendor. 09/19/2015 -- since her last visit and was admitted to the hospital between 09/09/2015 and 09/16/2015. She was thought to have an active sepsis possibly from one of her decubitus ulcers but nothing was grown except for an MSSA from her thoracic spine region. CT of this area did not show any  osteomyelitis. Been given IV antibiotics which  included vancomycin and Zosyn in the hospital under the care of Dr. [name] the ID specialist she was sent home on oral Keflex 500 mg 3 times a day for 2 weeks. he will consider an MRI in the outpatient setting to completely rule out osteomyelitis of the spine. 10/03/2015 --readmitted to hospital on 09/23/2015 for general debility, lethargy and possible sepsis and workup was in progress. Seen by Dr. [name] and he would also like a workup for underlying malignancy as sheos had previous ultrasounds of the breasts suggesting findings of concern. Workup done so far does not suggest deep bony infection. She was treated for a pneumonia and received Zosyn and vancomycin. She had been recommended Cipro 500 twice a day and doxycycline 100 mg twice a day once she was to be [place] home. The antibiotics were to be stopped on 10/07/2015. 10/17/2015 -- patient is feeling much better and has been eating better and doing her offloading as much as possible. 10/23/2015 -- she has an appointment to see Dr. [name] tomorrow but other than that has been doing as much as possible with offloading and increasing her diet. 11/07/2015 -- she has not been here to see [place] for 2 weeks and at the present time continues to try and eat better, work on off loading and is using the air mattress at night. She saw her PCP yesterday and she has put her back on a fentanyl patch. 11/21/2015 -- for some strange reason she has been sitting in a chair with her legs dependent for the last 6 days nonstop and has developed massive lower extremity edema and pedal edema with weeping and ulceration. 02/13/2016 -- it's been 3 weeks since I saw her last and overall she's done well except for a right heel where she has had some new ulcerations. 04/16/2016 -- last week I had noted that there was bone protruding in the decubitus ulcer of the thoracic spine area and due to the fact that the  patient is extremely labile in her emotional status I had called the patient's husband who came to see me and had a discussion regarding her care. He has primed her during the week and today she is going to be okay to discuss her diagnosis and treatment options. 05/07/2016 -- was in the ER on 04/19/2016 for altered mental status and dehydration. Both the patient and Wass, Jakeia H. ([id number]) her husband were rather limited in the choices of what they would want to do and did not want to be admitted to the hospital nor did they want a CT scan performed. Labs Performed showed that she was low on albumin and her hemoglobin was low but she did not have a mild leukocytosis. Her potassium was low at 3 and her albumin was 2.5 Was rehydrated with IV fluids and then discharged home to follow-up with the PCP closely. Electronic Signature(s) Signed: 05/21/2016 2:59:39 PM By: Christin Fudge MD, FACS Entered By: Christin Fudge on 05/21/2016 14:59:39 Bialecki, Zakiah H. ([id number]) -------------------------------------------------------------------------------- Physical Exam Details Patient Name: Freeze, Merita H. Date of Service: 05/21/2016 2:15 PM Medical Record Number: [id number] Patient Account Number: [id number] Date of Birth/Sex: [birth year] (70 y.o. Female) Treating RN: Montey Hora Primary Care Physician: Paulita Cradle Other Clinician: Referring Physician: Paulita Cradle Treating Physician/Extender: Frann Rider in Treatment: 62 Constitutional . Pulse regular. Respirations normal and unlabored. Afebrile. . Eyes Nonicteric. Reactive to light. Ears, Nose, Mouth, and Throat Lips, teeth, and gums WNL.Marland Kitchen Moist mucosa without lesions. Neck supple and nontender. No palpable supraclavicular  or cervical adenopathy. Normal sized without goiter. Respiratory WNL. No retractions.. Cardiovascular Pedal Pulses WNL. No clubbing, cyanosis or edema. Lymphatic No adneopathy. No adenopathy. No  adenopathy. Musculoskeletal Adexa without tenderness or enlargement.. Digits and nails w/o clubbing, cyanosis, infection, petechiae, ischemia, or inflammatory conditions.. Integumentary (Hair, Skin) No suspicious lesions. No crepitus or fluctuance. No peri-wound warmth or erythema. No masses.Marland Kitchen Psychiatric Judgement and insight Intact.. No evidence of depression, anxiety, or agitation.. Notes the lower extremity edema continues and she does not have any weeping. Wounds on both heels are looking cleaner and healthier. Right hip wound is fairly deep and we will continue to manage with packing The thoracic wound required some debridement sharply and she tolerated this well. We will change over to silver alginate over this wound. Electronic Signature(s) Signed: 05/21/2016 3:01:46 PM By: Christin Fudge MD, FACS Entered By: Christin Fudge on 05/21/2016 15:01:46 Coddington, Tyera H. ([id number]) -------------------------------------------------------------------------------- Physician Orders Details Patient Name: [name], Nykiah H. Date of Service: 05/21/2016 2:15 PM Medical Record Number: [id number] Patient Account Number: [id number] Date of Birth/Sex: 1945/10/22 (70 y.o. Female) Treating RN: Cornell Barman Primary Care Physician: Paulita Cradle Other Clinician: Referring Physician: Paulita Cradle Treating Physician/Extender: Frann Rider in Treatment: 90 Verbal / Phone Orders: Yes Clinician: Cornell Barman Read Back and Verified: Yes Diagnosis Coding Wound Cleansing Wound #11 Right,Distal Calcaneus o Clean wound with Normal Saline. Wound #15 Left,Medial Calcaneus o Clean wound with Normal Saline. Wound #3 Midline Back o Clean wound with Normal Saline. Wound #7 Right Ischial Tuberosity o Clean wound with Normal Saline. Anesthetic Wound #11 Right,Distal Calcaneus o Topical Lidocaine 4% cream applied to wound bed prior to debridement - applied in clinic only Wound #7 Right  Ischial Tuberosity o Topical Lidocaine 4% cream applied to wound bed prior to debridement - applied in clinic only Primary Wound Dressing Wound #11 Right,Distal Calcaneus o Other: Wound #15 Left,Medial Calcaneus o Other: Wound #7 Right Ischial Tuberosity o Santyl Ointment - ischial tuberosity use plain packing strip also Wound #3 Midline Back o Pack wound with: - packing strip and santyl Secondary Dressing Wound #11 Right,Distal Calcaneus o Boardered Foam Dressing Lykens, Glen H. ([id number]) Wound #15 Left,Medial Calcaneus o Boardered Foam Dressing o Boardered Foam Dressing Wound #3 Midline Back o Boardered Foam Dressing o Boardered Foam Dressing Wound #7 Right Ischial Tuberosity o Boardered Foam Dressing o Boardered Foam Dressing Dressing Change Frequency Wound #11 Right,Distal Calcaneus o Change dressing every other day. Wound #15 Left,Medial Calcaneus o Change dressing every other day. Wound #3 Midline Back o Change dressing every day. Wound #7 Right Ischial Tuberosity o Change dressing every day. Follow-up Appointments Wound #11 Right,Distal Calcaneus o Return Appointment in 1 week. Wound #15 Left,Medial Calcaneus o Return Appointment in 1 week. Wound #3 Midline Back o Return Appointment in 1 week. Wound #7 Right Ischial Tuberosity o Return Appointment in 1 week. Off-Loading Wound #11 Right,Distal Calcaneus o Turn and reposition every 2 hours - Keep pressure off of back and heels Wound #15 Left,Medial Calcaneus o Turn and reposition every 2 hours - Keep pressure off of back and heels Wound #3 Midline Back o Turn and reposition every 2 hours - Keep pressure off of back and heels Sparano, Carisma H. ([id number]) Wound #7 Right Ischial Tuberosity o Turn and reposition every 2 hours - Keep pressure off of back and heels Additional Orders / Instructions Wound #11 Right,Distal Calcaneus o Stop Smoking o Increase  protein intake. Wound #15 Left,Medial Calcaneus o Stop Smoking o Increase protein intake.  Wound #3 Midline Back o Stop Smoking o Increase protein intake. Wound #7 Right Ischial Tuberosity o Stop Smoking o Increase protein intake. Home Health Wound #11 [place] Visits - [place] Nurse may visit PRN to address patientos wound care needs. o FACE TO FACE ENCOUNTER: MEDICARE and MEDICAID PATIENTS: I certify that this patient is under my care and that I had a face-to-face encounter that meets the physician face-to-face encounter requirements with this patient on this date. The encounter with the patient was in whole or in part for the following MEDICAL CONDITION: (primary reason for [place]) MEDICAL NECESSITY: I certify, that based on my findings, NURSING services are a medically necessary home health service. HOME BOUND STATUS: I certify that my clinical findings support that this patient is homebound (i.e., Due to illness or injury, pt requires aid of supportive devices such as crutches, cane, wheelchairs, walkers, the use of special transportation or the assistance of another person to leave their place of residence. There is a normal inability to leave the home and doing so requires considerable and taxing effort. Other absences are for medical reasons / religious services and are infrequent or of short duration when for other reasons). o If current dressing causes regression in wound condition, may D/C ordered dressing product/s and apply Normal Saline Moist Dressing daily until next [place] / Other MD appointment. [place] of regression in wound condition at [phone]. o Please direct any NON-WOUND related issues/requests for orders to patient's Primary Care Physician Wound #15 [place] Visits - [place] Nurse may  visit PRN to address patientos wound care needs. o FACE TO FACE ENCOUNTER: MEDICARE and MEDICAID PATIENTS: I certify that this patient is under my care and that I had a face-to-face encounter that meets the physician face-to-face encounter requirements with this patient on this date. The encounter with the patient was in [place], [place]. ([id number]) whole or in part for the following MEDICAL CONDITION: (primary reason for [place]) MEDICAL NECESSITY: I certify, that based on my findings, NURSING services are a medically necessary home health service. HOME BOUND STATUS: I certify that my clinical findings support that this patient is homebound (i.e., Due to illness or injury, pt requires aid of supportive devices such as crutches, cane, wheelchairs, walkers, the use of special transportation or the assistance of another person to leave their place of residence. There is a normal inability to leave the home and doing so requires considerable and taxing effort. Other absences are for medical reasons / religious services and are infrequent or of short duration when for other reasons). o If current dressing causes regression in wound condition, may D/C ordered dressing product/s and apply Normal Saline Moist Dressing daily until next [place] / Other MD appointment. [place] of regression in wound condition at [phone]. o Please direct any NON-WOUND related issues/requests for orders to patient's Primary Care Physician Wound #3 Midline Back o [place] Visits - [place] Nurse may visit PRN to address patientos wound care needs. o FACE TO FACE ENCOUNTER: MEDICARE and MEDICAID PATIENTS: I certify that this patient is under my care and that I had a face-to-face encounter that meets the physician face-to-face encounter requirements with this patient on this date. The encounter with the patient was in whole or in part for the  following MEDICAL CONDITION: (primary reason for [place])  MEDICAL NECESSITY: I certify, that based on my findings, NURSING services are a medically necessary home health service. HOME BOUND STATUS: I certify that my clinical findings support that this patient is homebound (i.e., Due to illness or injury, pt requires aid of supportive devices such as crutches, cane, wheelchairs, walkers, the use of special transportation or the assistance of another person to leave their place of residence. There is a normal inability to leave the home and doing so requires considerable and taxing effort. Other absences are for medical reasons / religious services and are infrequent or of short duration when for other reasons). o If current dressing causes regression in wound condition, may D/C ordered dressing product/s and apply Normal Saline Moist Dressing daily until next [place] / Other MD appointment. [place] of regression in wound condition at [phone]. o Please direct any NON-WOUND related issues/requests for orders to patient's Primary Care Physician Wound #7 Right Ischial [place] Visits - [place] Nurse may visit PRN to address patientos wound care needs. o FACE TO FACE ENCOUNTER: MEDICARE and MEDICAID PATIENTS: I certify that this patient is under my care and that I had a face-to-face encounter that meets the physician face-to-face encounter requirements with this patient on this date. The encounter with the patient was in whole or in part for the following MEDICAL CONDITION: (primary reason for [place]) MEDICAL NECESSITY: I certify, that based on my findings, NURSING services are a medically necessary home health service. HOME BOUND STATUS: I certify that my clinical findings support that this patient is homebound (i.e., Due to illness or injury, pt requires aid of supportive devices such as  crutches, cane, wheelchairs, walkers, the use of special transportation or the assistance of another person to leave their place of residence. There is a normal inability to leave the home and doing so requires considerable and taxing effort. Other Har[place], Jonita H. ([id number]) absences are for medical reasons / religious services and are infrequent or of short duration when for other reasons). o If current dressing causes regression in wound condition, may D/C ordered dressing product/s and apply Normal Saline Moist Dressing daily until next [place] / Other MD appointment. [place] of regression in wound condition at [phone]. o Please direct any NON-WOUND related issues/requests for orders to patient's Primary Care Physician Electronic Signature(s) Signed: 05/21/2016 3:03:33 PM By: Christin Fudge MD, FACS Signed: 05/24/2016 12:18:12 PM By: Gretta Cool RN, BSN, Kim RN, BSN Entered By: Gretta Cool, RN, BSN, Kim on 05/21/2016 14:53:16 Grose, Vidalia H. ([id number]) -------------------------------------------------------------------------------- Problem List Details Patient Name: Spychalski, Rosita H. Date of Service: 05/21/2016 2:15 PM Medical Record Number: [id number] Patient Account Number: [id number] Date of Birth/Sex: [birth year] (70 y.o. 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 ([id number]) I89.0 Lymphedema, not elsewhere classified 11/21/2015 11/21/2015 L97.211 Non-pressure chronic ulcer of right calf limited to 11/21/2015 11/21/2015 breakdown of skin Electronic Signature(s) Signed: 05/21/2016 2:59:07 PM By: Christin Fudge MD, FACS Entered By: Christin Fudge on 05/21/2016 14:59:07 Wilk, Contrina H. ([id number]) -------------------------------------------------------------------------------- Progress Note Details Patient Name: Misner, Chrisma H. Date of Service: 05/21/2016 2:15 PM Medical Record Number: [id number] Patient Account Number: [id number] Date of Birth/Sex: [birth year] (70 y.o. Female) Treating RN: Montey Hora Primary Care Physician: Paulita Cradle Other Clinician: Referring Physician: Paulita Cradle Treating Physician/Extender: Frann Rider in Treatment: 75 Subjective Chief Complaint Information obtained from Patient Patient presents to the wound care center for a consult due non healing wound. Ulcers on the right elbow and the right heel for about 1 month. History of Present Illness (HPI) The following HPI elements were documented for the patient's wound: Location: Ulceration on the right heel and the right elbow. Quality: Patient reports experiencing a dull pain to affected area(s). Severity: Patient states wound (s) are getting better. Duration: Patient has had the wound for < 4 weeks prior to presenting for treatment Timing: Pain in wound is Intermittent (comes and goes Context: The wound appeared gradually over time Modifying Factors: Consults to this date include:Augmentin and  Bactrim and also some heel protection with duoderm Associated Signs and Symptoms: Patient reports having difficulty standing for long periods. 70 year old female with history of peripheral neuropathy, history of diet controlled diabetes mellitus type 2, history of alcoholism here for wound consult sent by her PCP Dr. Sherilyn [name]. She has pressure ulcers at her right elbow, bilateral heels. Plain films of right calcaneus without acute bony process. Patient started by PCP on Augmentin, Bactrim as per orders, DuoDerm dressings applied - reports some improvement in her ulcer since last seen. Denies fever, chills, nausea, vomiting, diarrhea. She had a right humerus fracture in the middle of May and has had no surgery and arm is in a sling. She is also been laying in the bed for quite a while. Past medical history significant for essential hypertension, osteoporosis, peripheral neuropathy, alcoholism, ataxia, personal history of breast cancer treated with surgery chemotherapy and radiation and this was done in December 2010. she is also status post laparoscopic cholecystectomy, pilonidal cyst excision, subcutaneous port placement, partial mastectomy on the left side, skin cancer removal. 03/21/2015 -- she says overall she's been doing better and continues to smoke about 15 cigarettes a day. 03/21/2015 - her orthopedic doctor has said she may require surgery for her right humerus fracture. 04/04/2015 -- her orthopedic surgery has been scheduled for August 11. 04/18/2015 -- she is doing fine as far as her elbow and her right heel goes but she has developed some redness over prominence on her thoracic spine and wanted me to take a look at this. Anstead, Azaiah H. ([id number]) 05/02/2015 -- she had her surgery done and now is in a sling and support. Her back has developed a pressure injury of undetermined stage. She seems to be in better spirits. 05/16/2015 -- last week her right heel was looking great and we had  healed it out, but she has not been offloading appropriately and has a deep tissue injury on the right heel again. The area on her right elbow has opened out with slough and the area in the thoracic spine is also getting worse. 05/30/2015 -- she has developed 2 new ulcerations one on her left ischial tuberosity and one  on the sacral region. She is still working on getting up smoking but is also unable to take her vitamins as she says she develops a diarrhea when she takes vitamins. She has increased her intake of proteins. 06/06/2015 -- the patient's husband manages to get her a low air loss mattress with initiating pressure but did not know how to use it exactly and the patient was not happy about using it. Overall she says she's been feeling better. 07/11/2015 -- . Discussed a surgical opinion for debridement and application of a wound VAC, 2 weeks ago but the patient has been reluctant to get a surgical opinion as she wants to avoid surgery. 07/18/2015 -- they have an appointment to see Dr. Tamala Julian this coming Wednesday. 07/29/2015 -- they saw Dr. Tamala Julian in his office and he did a debridement of the wound and removed significant amount of slough. This is in addition to the debridement I had done previously on Friday where a large amount of the eschar was removed. He did not recommend the application of wound VAC. Addendum : Dr. Thompson Caul note was reviewed via EPIC and details noted as above. 08/05/2015 -- over the last week she has developed a another pressure injury to her right hip and has had significant discoloration of the skin and an eschar there. 08/15/2015 -- she is still smoking about a pack of cigarettes a day and does not seem to want to quit. They have not been able to talk to the vendor regarding the air mattress and I will ask them to get in touch again. She is reluctant to take vitamins and does admit her nutrition is poor. 08/22/2015 -- she has been unable to tolerate her vitamins  and continues to smoke. They're working on getting a low air loss mattress and have been speaking with the vendor. 09/19/2015 -- since her last visit and was admitted to the hospital between 09/09/2015 and 09/16/2015. She was thought to have an active sepsis possibly from one of her decubitus ulcers but nothing was grown except for an MSSA from her thoracic spine region. CT of this area did not show any osteomyelitis. Been given IV antibiotics which included vancomycin and Zosyn in the hospital under the care of Dr. [name] the ID specialist she was sent home on oral Keflex 500 mg 3 times a day for 2 weeks. he will consider an MRI in the outpatient setting to completely rule out osteomyelitis of the spine. 10/03/2015 --readmitted to hospital on 09/23/2015 for general debility, lethargy and possible sepsis and workup was in progress. Seen by Dr. [name] and he would also like a workup for underlying malignancy as she s had previous ultrasounds of the breasts suggesting findings of concern. Workup done so far does not suggest deep bony infection. She was treated for a pneumonia and received Zosyn and vancomycin. She had been recommended Cipro 500 twice a day and doxycycline 100 mg twice a day once she was to be [place] home. The antibiotics were to be stopped on 10/07/2015. 10/17/2015 -- patient is feeling much better and has been eating better and doing her offloading as much as possible. 10/23/2015 -- she has an appointment to see Dr. [name] tomorrow but other than that has been doing as much as possible with offloading and increasing her diet. 11/07/2015 -- she has not been here to see [place] for 2 weeks and at the present time continues to try and eat better, work on off loading and is using the air mattress  at night. She saw her PCP yesterday and she has put her back on a fentanyl patch. 11/21/2015 -- for some strange reason she has been sitting in a chair with her legs dependent for the  last 6 days nonstop and has developed massive lower extremity edema and pedal edema with weeping and ulceration. Christon, Abena H. ([id number]) 02/13/2016 -- it's been 3 weeks since I saw her last and overall she's done well except for a right heel where she has had some new ulcerations. 04/16/2016 -- last week I had noted that there was bone protruding in the decubitus ulcer of the thoracic spine area and due to the fact that the patient is extremely labile in her emotional status I had called the patient's husband who came to see me and had a discussion regarding her care. He has primed her during the week and today she is going to be okay to discuss her diagnosis and treatment options. 05/07/2016 -- was in the ER on 04/19/2016 for altered mental status and dehydration. Both the patient and her husband were rather limited in the choices of what they would want to do and did not want to be admitted to the hospital nor did they want a CT scan performed. Labs Performed showed that she was low on albumin and her hemoglobin was low but she did not have a mild leukocytosis. Her potassium was low at 3 and her albumin was 2.5 Was rehydrated with IV fluids and then discharged home to follow-up with the PCP closely. Objective Constitutional Pulse regular. Respirations normal and unlabored. Afebrile. Vitals Time Taken: 2:27 PM, Height: 65 in, Weight: 93 lbs, BMI: 15.5, Temperature: 97.8 F, Pulse: 139 bpm, Respiratory Rate: 16 breaths/min, Blood Pressure: 96/66 mmHg. Eyes Nonicteric. Reactive to light. Ears, Nose, Mouth, and Throat Lips, teeth, and gums WNL.Marland Kitchen Moist mucosa without lesions. Neck supple and nontender. No palpable supraclavicular or cervical adenopathy. Normal sized without goiter. Respiratory WNL. No retractions.. Cardiovascular Pedal Pulses WNL. No clubbing, cyanosis or edema. Lymphatic No adneopathy. No adenopathy. No adenopathy. Musculoskeletal Adexa without tenderness or  enlargement.. Digits and nails w/o clubbing, cyanosis, infection, petechiae, ischemia, or inflammatory conditions.Marland Kitchen Stalder, Arthur H. ([id number]) Psychiatric Judgement and insight Intact.. No evidence of depression, anxiety, or agitation.. General Notes: the lower extremity edema continues and she does not have any weeping. Wounds on both heels are looking cleaner and healthier. Right hip wound is fairly deep and we will continue to manage with packing The thoracic wound required some debridement sharply and she tolerated this well. We will change over to silver alginate over this wound. Integumentary (Hair, Skin) No suspicious lesions. No crepitus or fluctuance. No peri-wound warmth or erythema. No masses.. Wound #11 status is Open. Original cause of wound was Pressure Injury. The wound is located on the Right,Distal Calcaneus. The wound measures 0.2cm length x 0.1cm width x 0.1cm depth; 0.016cm^2 area and 0.002cm^3 volume. The wound is limited to skin breakdown. There is no tunneling or undermining noted. There is a small amount of serous drainage noted. The wound margin is flat and intact. There is large (67-100%) pink granulation within the wound bed. There is a small (1-33%) amount of necrotic tissue within the wound bed including Adherent Slough. The periwound skin appearance exhibited: Localized Edema. The periwound skin appearance did not exhibit: Callus, Crepitus, Excoriation, Fluctuance, Friable, Induration, Rash, Scarring, Dry/Scaly, Maceration, Moist, Atrophie Blanche, Cyanosis, Ecchymosis, Hemosiderin Staining, Mottled, Pallor, Rubor, Erythema. Periwound temperature was noted as No Abnormality. The periwound has tenderness  on palpation. Wound #15 status is Open. Original cause of wound was Pressure Injury. The wound is located on the Left,Medial Calcaneus. The wound measures 0.2cm length x 0.5cm width x 0.2cm depth; 0.079cm^2 area and 0.016cm^3 volume. The wound is limited to skin  breakdown. There is a small amount of serous drainage noted. The wound margin is flat and intact. There is large (67-100%) red granulation within the wound bed. There is no necrotic tissue within the wound bed. The periwound skin appearance exhibited: Maceration. The periwound skin appearance did not exhibit: Callus, Crepitus, Excoriation, Fluctuance, Friable, Induration, Localized Edema, Rash, Scarring, Dry/Scaly, Moist, Atrophie Blanche, Cyanosis, Ecchymosis, Hemosiderin Staining, Mottled, Pallor, Rubor, Erythema. Periwound temperature was noted as No Abnormality. The periwound has tenderness on palpation. Wound #3 status is Open. Original cause of wound was Pressure Injury. The wound is located on the Midline Back. The wound measures 4.5cm length x 4.2cm width x 0.3cm depth; 14.844cm^2 area and 4.453cm^3 volume. There is bone exposed. There is undermining starting at 10:00 and ending at 2:00 with a maximum distance of 1.5cm. There is a medium amount of serous drainage noted. The wound margin is flat and intact. There is large (67-100%) red, pink granulation within the wound bed. There is a small (1-33%) amount of necrotic tissue within the wound bed including Eschar and Adherent Slough. The periwound skin appearance exhibited: Moist, Erythema. The periwound skin appearance did not exhibit: Callus, Crepitus, Excoriation, Fluctuance, Friable, Induration, Localized Edema, Rash, Scarring, Dry/Scaly, Maceration, Atrophie Blanche, Cyanosis, Ecchymosis, Hemosiderin Staining, Mottled, Pallor, Rubor. The surrounding wound skin color is noted with erythema which is circumferential. Periwound temperature was noted as No Abnormality. The periwound has tenderness on palpation. Wound #7 status is Open. Original cause of wound was Pressure Injury. The wound is located on the Right Ischial Tuberosity. The wound measures 0.7cm length x 0.4cm width x 1.2cm depth; 0.22cm^2 area and 0.264cm^3 volume. The wound is  limited to skin breakdown. There is no tunneling noted, however, there is undermining starting at 12:00 and ending at 12:00 with a maximum distance of 0.3cm. There is a large amount of serous drainage noted. The wound margin is flat and intact. There is medium (34-66%) pink Teas, Indiya H. ([id number]) granulation within the wound bed. There is a medium (34-66%) amount of necrotic tissue within the wound bed including Adherent Slough. The periwound skin appearance exhibited: Moist, Erythema. The periwound skin appearance did not exhibit: Callus, Crepitus, Excoriation, Fluctuance, Friable, Induration, Localized Edema, Rash, Scarring, Dry/Scaly, Maceration, Atrophie Blanche, Cyanosis, Ecchymosis, Hemosiderin Staining, Mottled, Pallor, Rubor. The surrounding wound skin color is noted with erythema which is circumferential. Periwound temperature was noted as No Abnormality. The periwound has tenderness on palpation. 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 There was a Skin/Subcutaneous Tissue Debridement [id number]) debridement with total area of 0.72 sq cm performed by Christin Fudge, MD. with the following instrument(s): Forceps and Scissors to remove Non-Viable tissue/material including Exudate, Fibrin/Slough, and Subcutaneous after achieving pain control using Other (lidocaine 4% cream). A time out was conducted at 14:45, prior to the start of the procedure. A Minimum amount of bleeding was controlled with Pressure. The procedure was tolerated  well with a pain level of 3 throughout and a pain level of 3 following the procedure.  Post Debridement Measurements: 0.8cm length x 0.9cm width x 0.1cm depth; 0.057cm^3 volume. Post debridement Stage noted as Category/Stage IV. Character of Wound/Ulcer Post Debridement requires further debridement. Severity of Tissue Post Debridement is: Fat layer exposed. Post procedure Diagnosis Wound #3: Same as Pre-Procedure Plan Weseman, Kimm H. ([id number]) Wound Cleansing: Wound #11 Right,Distal Calcaneus: Clean wound with Normal Saline. Wound #15 Left,Medial Calcaneus: Clean wound with Normal Saline. Wound #3 Midline Back: Clean wound with Normal Saline. Wound #7 Right Ischial Tuberosity: Clean wound with Normal Saline. Anesthetic: Wound #11 Right,Distal Calcaneus: Topical Lidocaine 4% cream applied to wound bed prior to debridement - applied in clinic only Wound #7 Right Ischial Tuberosity: Topical Lidocaine 4% cream applied to wound bed prior to debridement - applied in clinic only Primary Wound Dressing: Wound #11 Right,Distal Calcaneus: Other: Wound #15 Left,Medial Calcaneus: Other: Wound #7 Right Ischial Tuberosity: Santyl Ointment - ischial tuberosity use plain packing strip also Wound #3 Midline Back: Pack wound with: - packing strip and santyl Secondary Dressing: Wound #11 Right,Distal Calcaneus: Boardered Foam Dressing Wound #15 Left,Medial Calcaneus: Boardered Foam Dressing Boardered Foam Dressing Wound #3 Midline Back: Boardered Foam Dressing Boardered Foam Dressing Wound #7 Right Ischial Tuberosity: Boardered Foam Dressing Boardered Foam Dressing Dressing Change Frequency: Wound #11 Right,Distal Calcaneus: Change dressing every other day. Wound #15 Left,Medial Calcaneus: Change dressing every other day. Wound #3 Midline Back: Change dressing every day. Wound #7 Right Ischial Tuberosity: Change dressing every day. Follow-up Appointments: Wound #11 Right,Distal Calcaneus: Return Appointment in 1 week. Wound #15 Left,Medial Calcaneus: Return  Appointment in 1 week. Wound #3 Midline Back: Silbernagel, Shirlette H. ([id number]) Return Appointment in 1 week. Wound #7 Right Ischial Tuberosity: Return Appointment in 1 week. Off-Loading: Wound #11 Right,Distal Calcaneus: Turn and reposition every 2 hours - Keep pressure off of back and heels Wound #15 Left,Medial Calcaneus: Turn and reposition every 2 hours - Keep pressure off of back and heels Wound #3 Midline Back: Turn and reposition every 2 hours - Keep pressure off of back and heels Wound #7 Right Ischial Tuberosity: Turn and reposition every 2 hours - Keep pressure off of back and heels Additional Orders / Instructions: Wound #11 Right,Distal Calcaneus: Stop Smoking Increase protein intake. Wound #15 Left,Medial Calcaneus: Stop Smoking Increase protein intake. Wound #3 Midline Back: Stop Smoking Increase protein intake. Wound #7 Right Ischial Tuberosity: Stop Smoking Increase protein intake. Home Health: Wound #11 Right,Distal Calcaneus: Continue Home Health Visits - [facility] Nurse may visit PRN to address patient s wound care needs. FACE TO FACE ENCOUNTER: MEDICARE and MEDICAID PATIENTS: I certify that this patient is under my care and that I had a face-to-face encounter that meets the physician face-to-face encounter requirements with this patient on this date. The encounter with the patient was in whole or in part for the following MEDICAL CONDITION: (primary reason for [place]) MEDICAL NECESSITY: I certify, that based on my findings, NURSING services are a medically necessary home health service. HOME BOUND STATUS: I certify that my clinical findings support that this patient is homebound (i.e., Due to illness or injury, pt requires aid of supportive devices such as crutches, cane, wheelchairs, walkers, the use of special transportation or the assistance of another person to leave their place of residence. There is a normal inability to leave the home  and doing so requires considerable and taxing effort. Other absences are for medical  reasons / religious services and are infrequent or of short duration when for other reasons). If current dressing causes regression in wound condition, may D/C ordered dressing product/s and apply Normal Saline Moist Dressing daily until next [place] / Other MD appointment. [place] of regression in wound condition at [phone]. Please direct any NON-WOUND related issues/requests for orders to patient's Primary Care Physician Wound #15 Left,Medial Calcaneus: [place] Visits - [facility] Nurse may visit PRN to address patient s wound care needs. FACE TO FACE ENCOUNTER: MEDICARE and MEDICAID PATIENTS: I certify that this patient is under my care and that I had a face-to-face encounter that meets the physician face-to-face encounter requirements with this patient on this date. The encounter with the patient was in whole or in part for the following MEDICAL CONDITION: (primary reason for [place]) MEDICAL NECESSITY: I certify, that based on my findings, NURSING services are a medically necessary home health service. HOME Pautsch, Nayla H. ([id number]) BOUND STATUS: I certify that my clinical findings support that this patient is homebound (i.e., Due to illness or injury, pt requires aid of supportive devices such as crutches, cane, wheelchairs, walkers, the use of special transportation or the assistance of another person to leave their place of residence. There is a normal inability to leave the home and doing so requires considerable and taxing effort. Other absences are for medical reasons / religious services and are infrequent or of short duration when for other reasons). If current dressing causes regression in wound condition, may D/C ordered dressing product/s and apply Normal Saline Moist Dressing daily until next [place] / Other MD  appointment. [place] of regression in wound condition at [phone]. Please direct any NON-WOUND related issues/requests for orders to patient's Primary Care Physician Wound #3 Midline Back: [employer] Visits - [facility] Nurse may visit PRN to address patient s wound care needs. FACE TO FACE ENCOUNTER: MEDICARE and MEDICAID PATIENTS: I certify that this patient is under my care and that I had a face-to-face encounter that meets the physician face-to-face encounter requirements with this patient on this date. The encounter with the patient was in whole or in part for the following MEDICAL CONDITION: (primary reason for [place]) MEDICAL NECESSITY: I certify, that based on my findings, NURSING services are a medically necessary home health service. HOME BOUND STATUS: I certify that my clinical findings support that this patient is homebound (i.e., Due to illness or injury, pt requires aid of supportive devices such as crutches, cane, wheelchairs, walkers, the use of special transportation or the assistance of another person to leave their place of residence. There is a normal inability to leave the home and doing so requires considerable and taxing effort. Other absences are for medical reasons / religious services and are infrequent or of short duration when for other reasons). If current dressing causes regression in wound condition, may D/C ordered dressing product/s and apply Normal Saline Moist Dressing daily until next [place] / Other MD appointment. [place] of regression in wound condition at [phone]. Please direct any NON-WOUND related issues/requests for orders to patient's Primary Care Physician Wound #7 Right Ischial Tuberosity: [place] Visits - [facility] Nurse may visit PRN to address patient s wound care needs. FACE TO FACE ENCOUNTER: MEDICARE and MEDICAID PATIENTS: I  certify that this patient is under my care and that I had a face-to-face encounter  that meets the physician face-to-face encounter requirements with this patient on this date. The encounter with the patient was in whole or in part for the following MEDICAL CONDITION: (primary reason for [place]) MEDICAL NECESSITY: I certify, that based on my findings, NURSING services are a medically necessary home health service. HOME BOUND STATUS: I certify that my clinical findings support that this patient is homebound (i.e., Due to illness or injury, pt requires aid of supportive devices such as crutches, cane, wheelchairs, walkers, the use of special transportation or the assistance of another person to leave their place of residence. There is a normal inability to leave the home and doing so requires considerable and taxing effort. Other absences are for medical reasons / religious services and are infrequent or of short duration when for other reasons). If current dressing causes regression in wound condition, may D/C ordered dressing product/s and apply Normal Saline Moist Dressing daily until next [place] / Other MD appointment. [place] of regression in wound condition at [phone]. Please direct any NON-WOUND related issues/requests for orders to patient's Primary Care Physician Almon, Floye H. ([id number]) The lower extremity edema continues and she does not have any weeping. Wounds on both heels are looking cleaner and healthier. Right hip wound is fairly deep and we will continue to manage with packing The thoracic wound required some debridement sharply and she tolerated this well. We will change over to silver alginate over this wound. Elevation of her limbs and good protein intake has been emphasized Electronic Signature(s) Signed: 05/21/2016 3:02:43 PM By: Christin Fudge MD, FACS Entered By: Christin Fudge on 05/21/2016 15:02:43 Koren, Vandella H.  ([id number]) -------------------------------------------------------------------------------- SuperBill Details Patient Name: [name], Monika H. Date of Service: 05/21/2016 Medical Record Number: [id number] Patient Account Number: [id number] Date of Birth/Sex: [birth year] (70 y.o. 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, stage 3 L89.613 Pressure ulcer of right heel, stage 3 Facility Procedures CPT4 Code Description: [id number] - DEB SUBQ TISSUE 20 SQ CM/< ICD-10 Description Diagnosis E11.621 Type 2 diabetes mellitus with foot ulcer F17.218 Nicotine dependence, cigarettes, with other nicoti L89.100 Pressure ulcer of unspecified part of  back, unstag 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

## 2016-05-28 ENCOUNTER — Ambulatory Visit: Payer: Medicare Other | Admitting: Surgery

## 2016-06-04 ENCOUNTER — Ambulatory Visit: Payer: Medicare Other | Admitting: Surgery

## 2016-06-13 DEATH — deceased

## 2017-07-04 IMAGING — CT CT HEAD W/O CM
1 series · 15 of 30 positions shown, 19 images · non-contrast
Comparison: MRI of the brain performed 11/27/2014

CLINICAL DATA: Chronic weight loss, status post shoulder surgery,
with ulcerations about the body. Loss of appetite. Initial
encounter.

EXAM:
CT HEAD WITHOUT CONTRAST
TECHNIQUE: Contiguous axial images were obtained from the base of the skull
through the vertex without intravenous contrast.

[Series 2: head wo · axial · 0.47mm/px · z∈[-136,-10]mm · 15 of 32 slices shown, 19 images]
[im 2/32  brain]
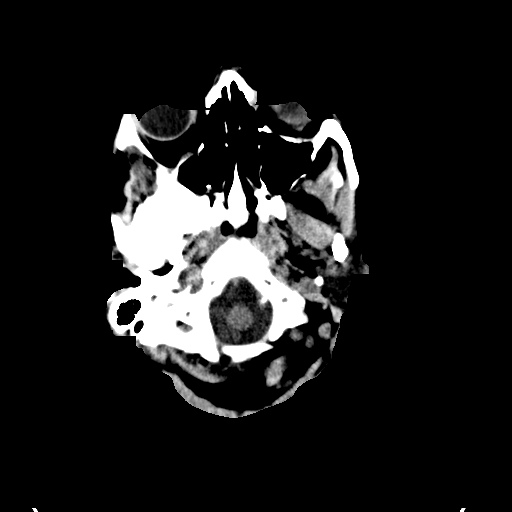
[im 2/32  bone]
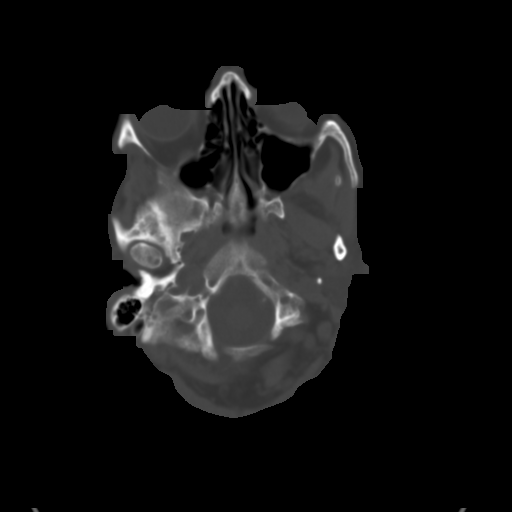
[im 4/32  brain]
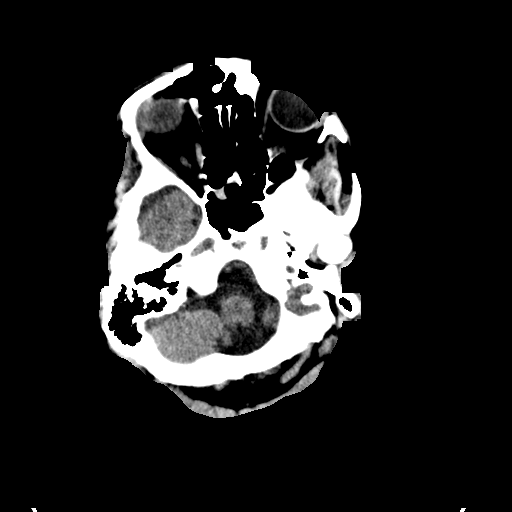
[im 6/32  brain]
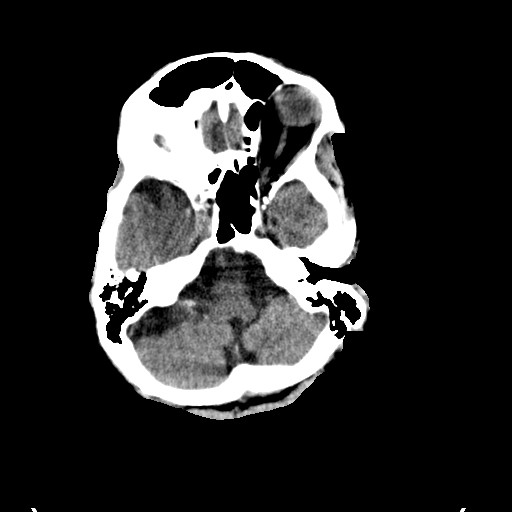
[im 8/32  brain]
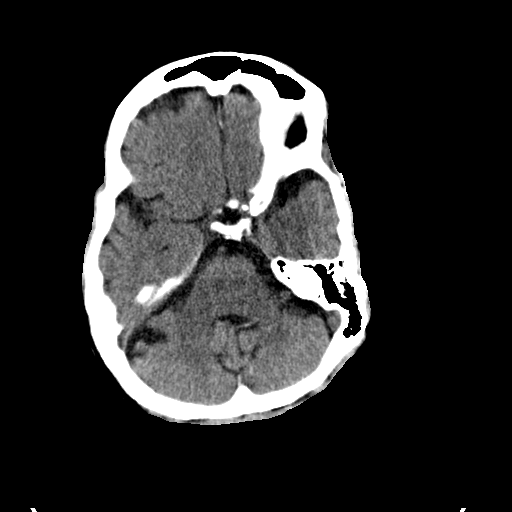
[im 10/32  brain]
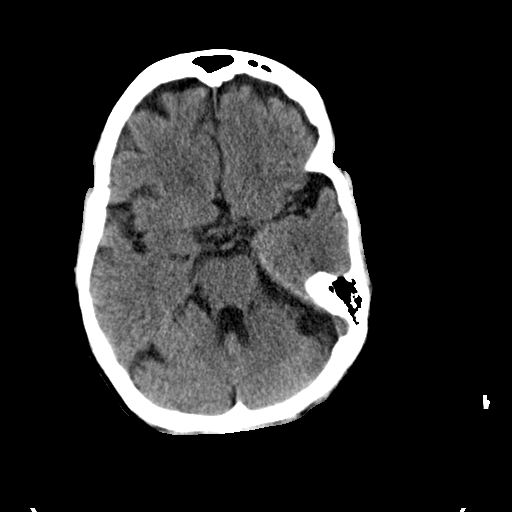
[im 10/32  bone]
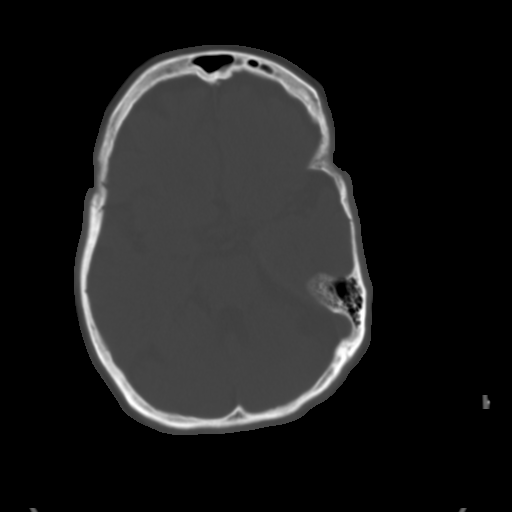
[im 12/32  brain]
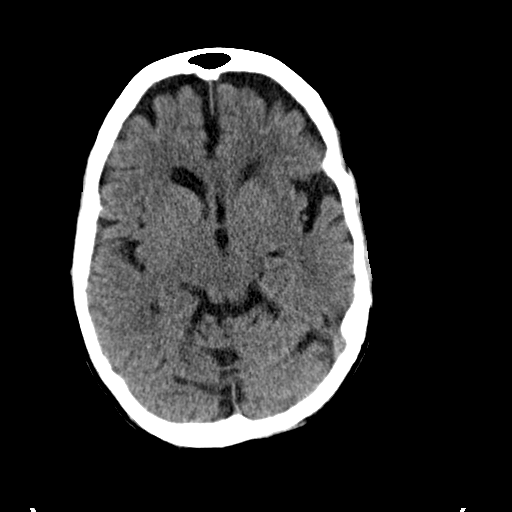
[im 14/32  brain]
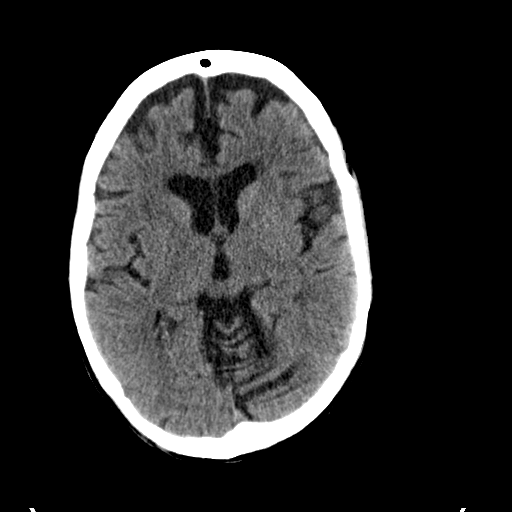
[im 17/32  brain]
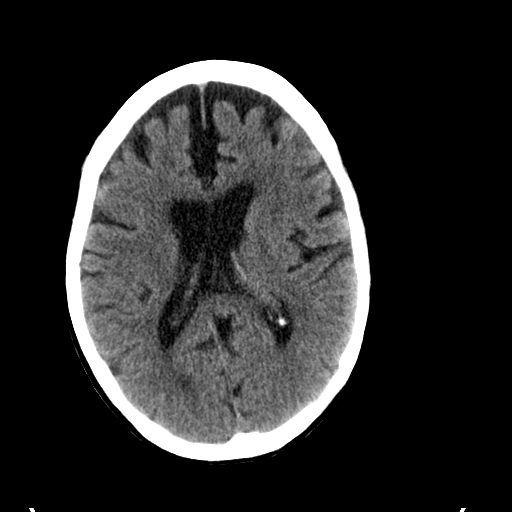
[im 18/32  brain]
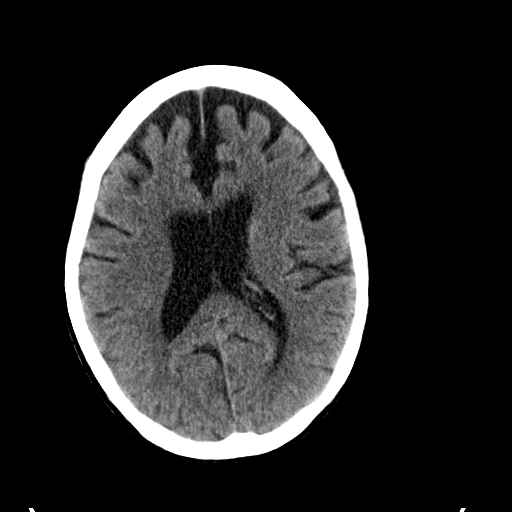
[im 18/32  bone]
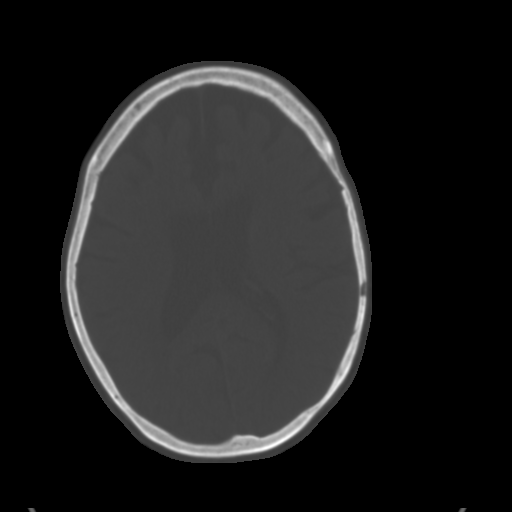
[im 20/32  brain]
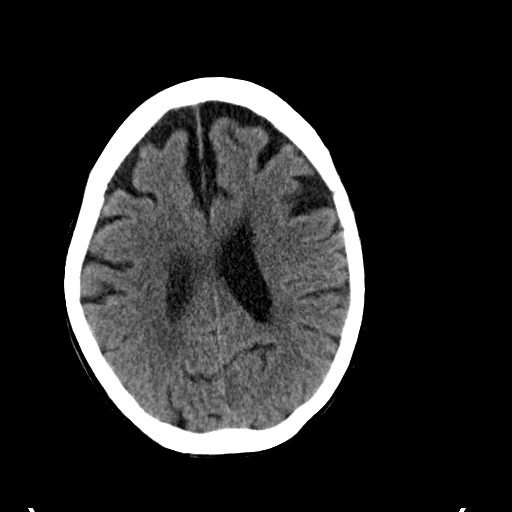
[im 22/32  brain]
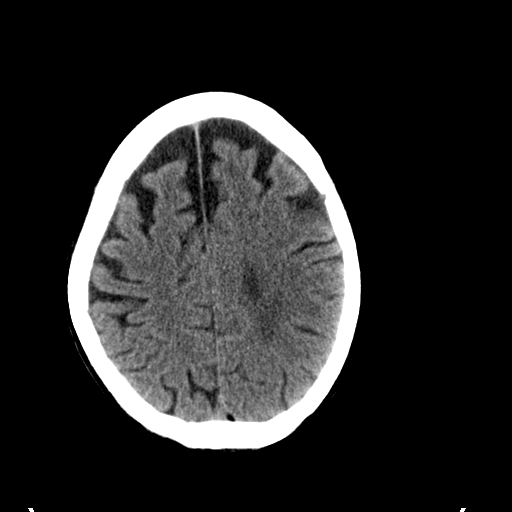
[im 24/32  brain]
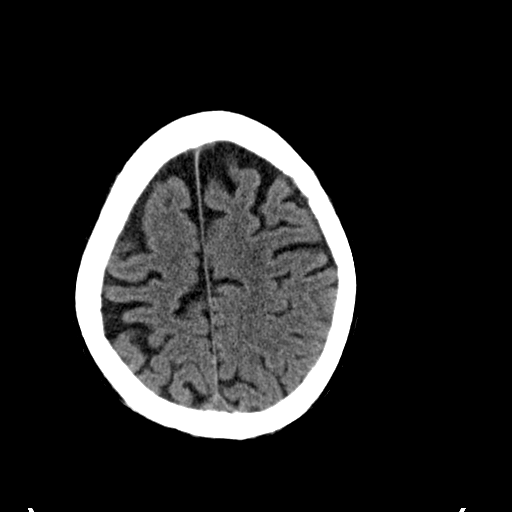
[im 26/32  brain]
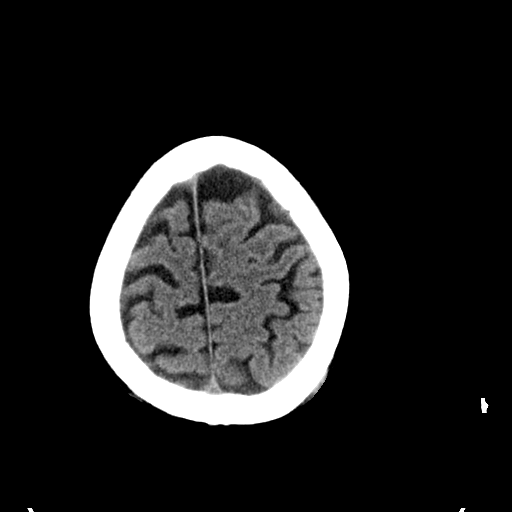
[im 26/32  bone]
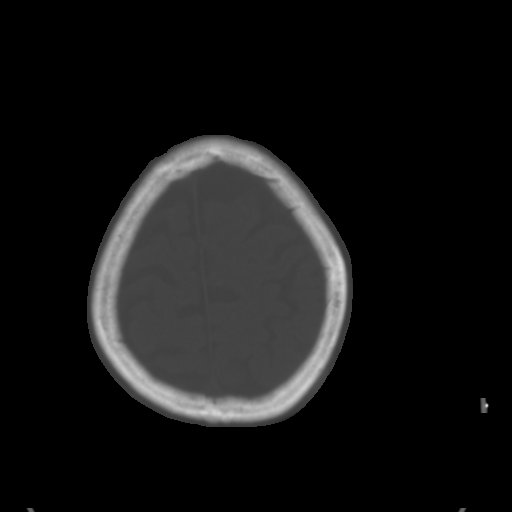
[im 28/32  brain]
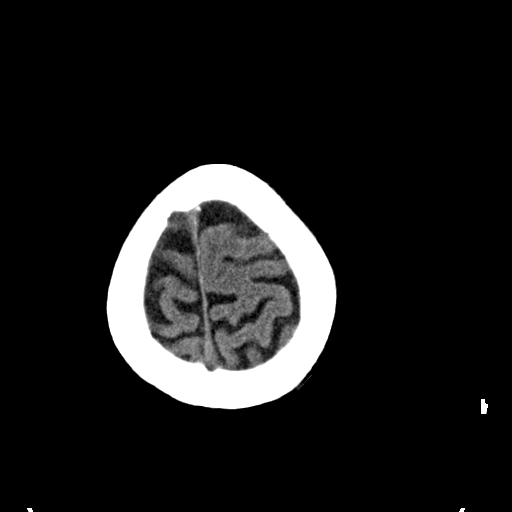
[im 30/32  brain]
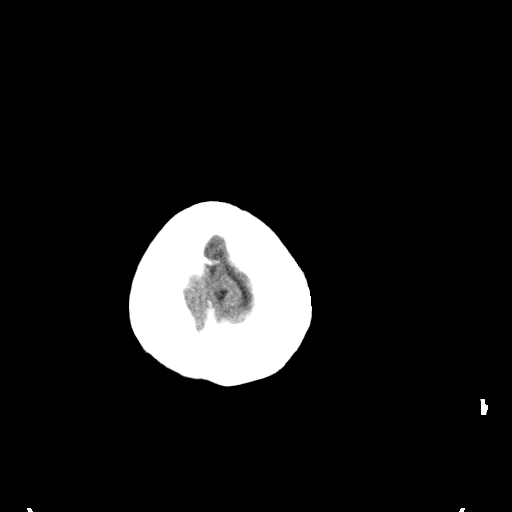

[15 of 30 positions shown; findings below may reference images not displayed]

FINDINGS: There is no evidence of acute infarction, mass lesion, or intra- or
extra-axial hemorrhage on CT.

Prominence of the ventricles and sulci reflects moderate cortical
volume loss. Cerebellar atrophy is noted. Scattered periventricular
and subcortical white matter change likely reflects small vessel
ischemic microangiopathy.

The brainstem and fourth ventricle are within normal limits. The
basal ganglia are unremarkable in appearance. The cerebral
hemispheres demonstrate grossly normal gray-white differentiation.
No mass effect or midline shift is seen.

There is no evidence of fracture; visualized osseous structures are
unremarkable in appearance. The visualized portions of the orbits
are within normal limits. The paranasal sinuses and mastoid air
cells are well-aerated. No significant soft tissue abnormalities are
seen.
IMPRESSION: 1. No acute intracranial pathology seen on CT.
2. Moderate cortical volume loss and scattered small vessel ischemic
microangiopathy.

## 2017-07-19 IMAGING — CT CT ABD-PELV W/ CM
1 of 3 series · 13 of 32 positions shown, 18 images · IV contrast (omnipaque)
Comparison: CT 09/10/2009, 09/11/2015.

CLINICAL DATA: Decubitus ulcer. Sepsis. Elevated white blood cell
count and bilirubin.

EXAM:
CT ABDOMEN, AND PELVIS WITH CONTRAST
TECHNIQUE: Multidetector CT imaging of theabdomen and pelvis was performed
following the standard protocol during bolus administration of
intravenous contrast.
CONTRAST:  75mL OMNIPAQUE IOHEXOL 300 MG/ML  SOLN

[Series 2: routine abd pel with · axial · 0.68mm/px · z∈[-1112,-722]mm · 13 of 88 slices shown, 18 images]
[im 5/88  soft-tissue]
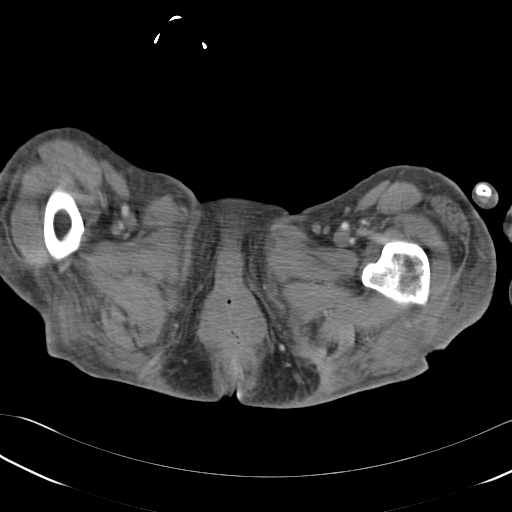
[im 5/88  bone]
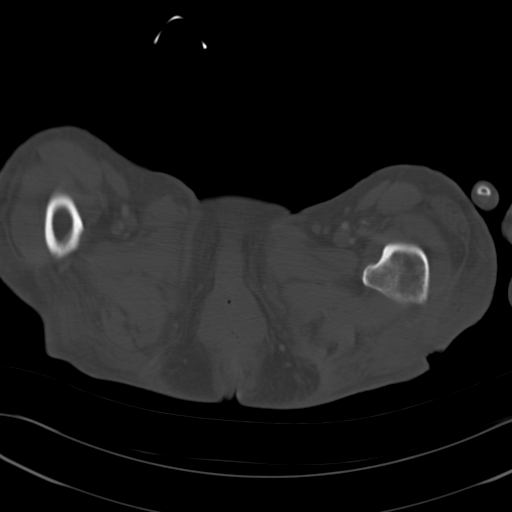
[im 14/88  soft-tissue]
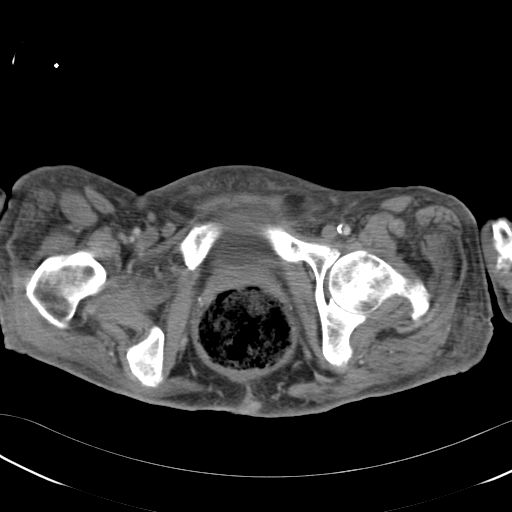
[im 19/88  soft-tissue]
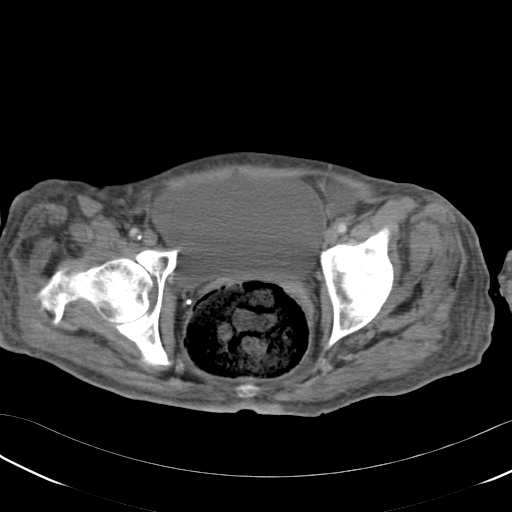
[im 28/88  soft-tissue]
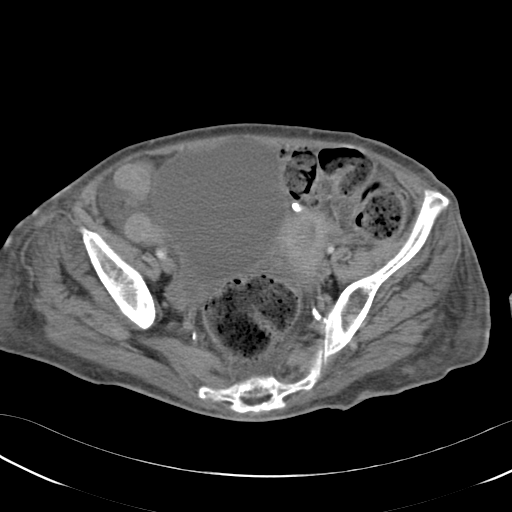
[im 33/88  soft-tissue]
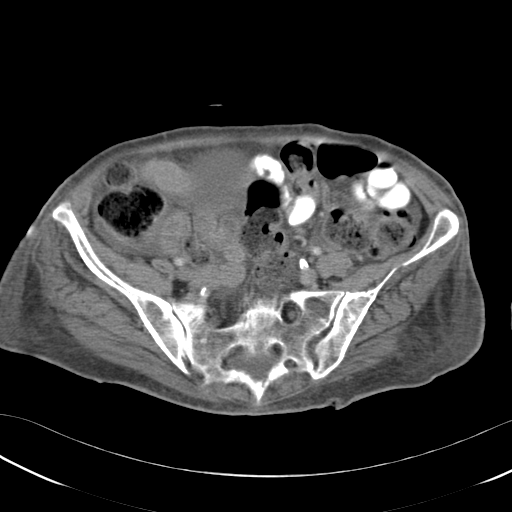
[im 42/88  soft-tissue]
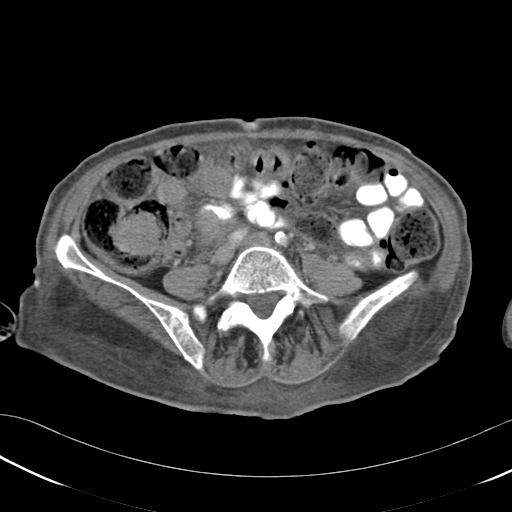
[im 46/88  soft-tissue]
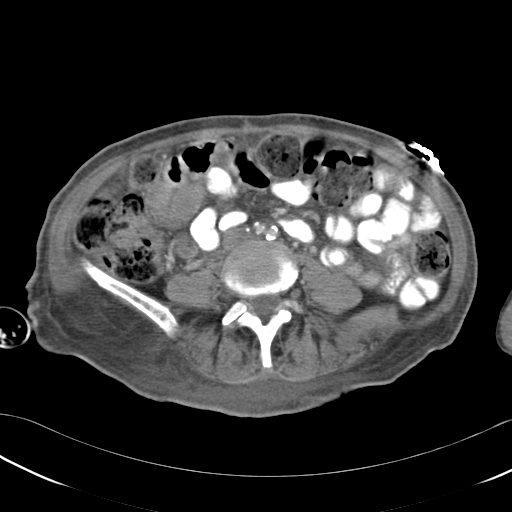
[im 55/88  soft-tissue]
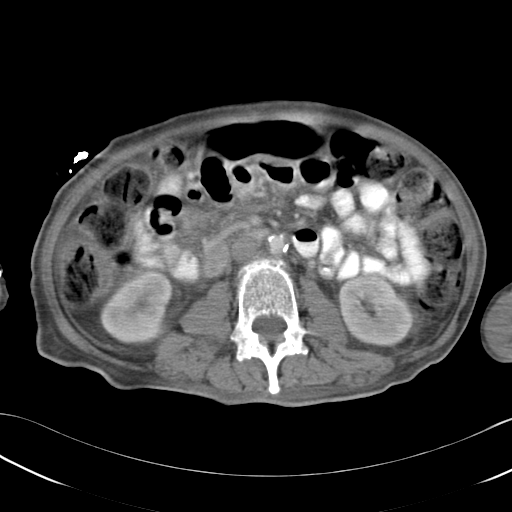
[im 60/88  soft-tissue]
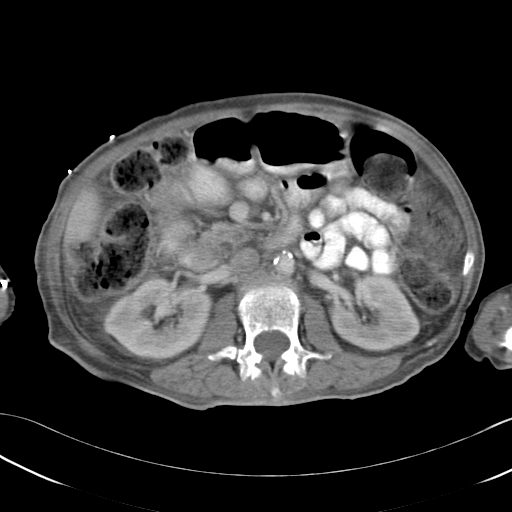
[im 60/88  bone]
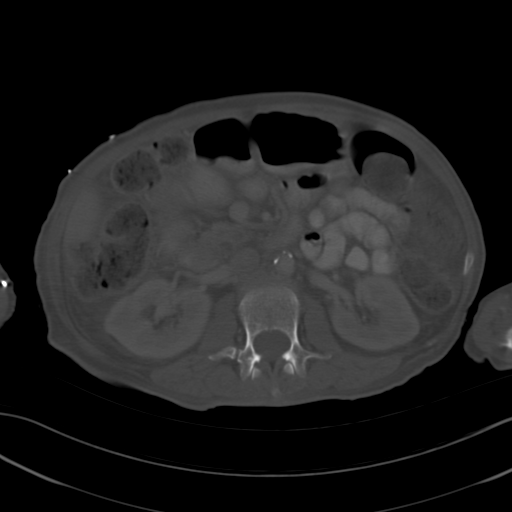
[im 69/88  soft-tissue]
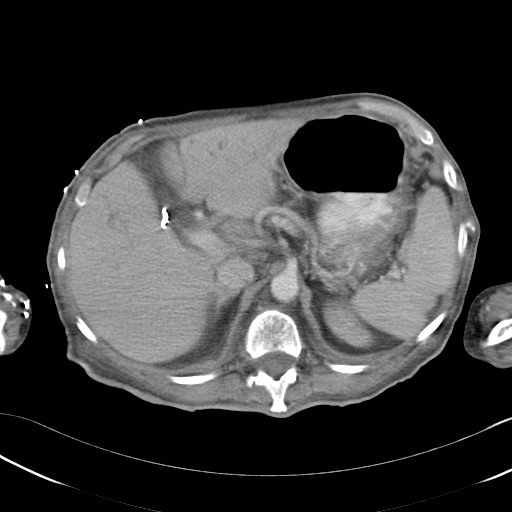
[im 69/88  lung]
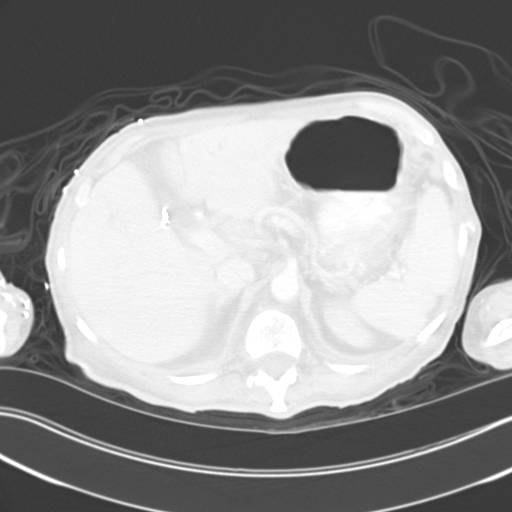
[im 74/88  soft-tissue]
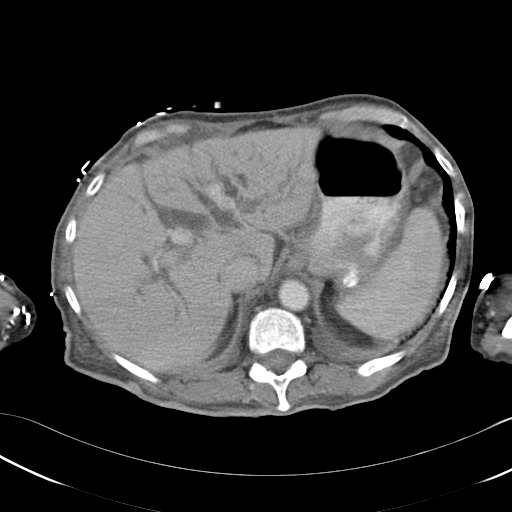
[im 74/88  lung]
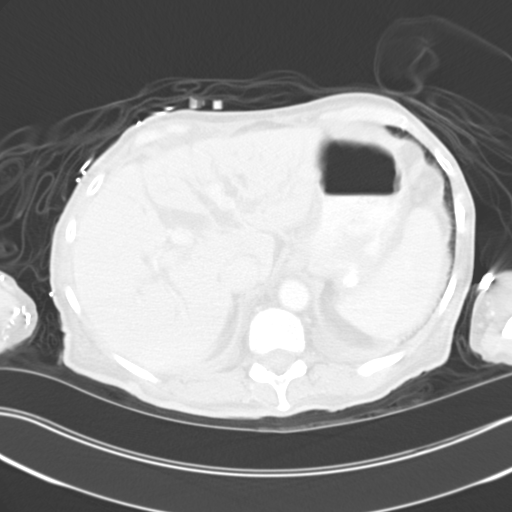
[im 78/88  lung]
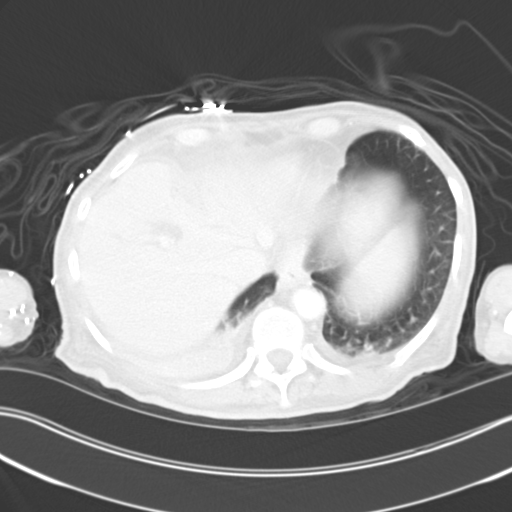
[im 83/88  soft-tissue]
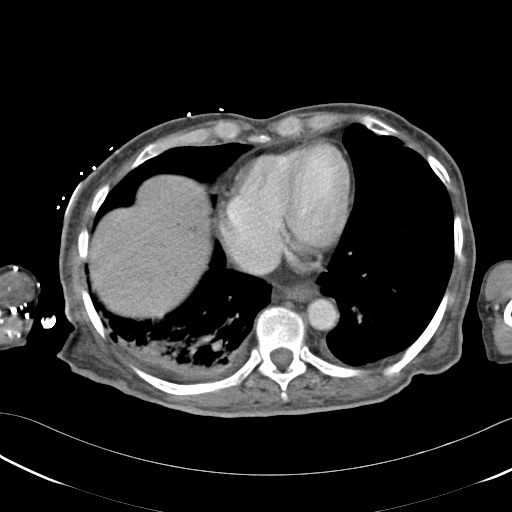
[im 83/88  lung]
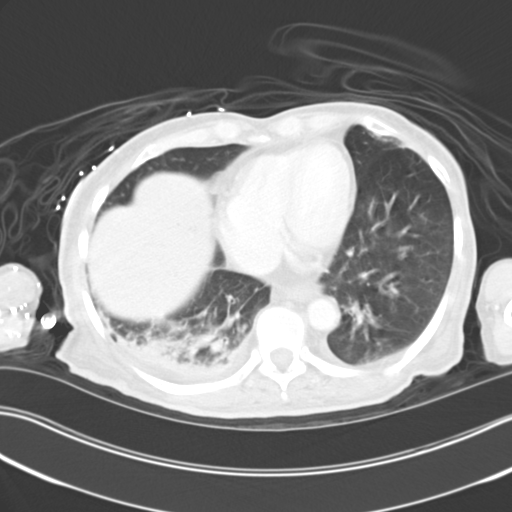

[13 of 32 positions shown; findings below may reference images not displayed]

FINDINGS: CT ABDOMEN AND PELVIS FINDINGS

Lower chest: Mild branching nodular airspace disease at the RIGHT
lung base with small effusion.

Hepatobiliary: Moderate intrahepatic and extrahepatic biliary duct
dilatation. Patient status post cholecystectomy. The degree of
dilatation is increased from 9515. The common bile duct measures 9
mm. Dilatation to the ampullary level.

Pancreas: The pancreatic duct is dilated to 7 mm. There is atrophy
of the pancreatic parenchyma. Potential ductus divisum anatomy.

Spleen: Normal spleen

Adrenals/urinary tract: Adrenal glands and kidneys are normal. The
ureters and bladder normal. The bladder is distended.

Stomach/Bowel: Stomach, small bowel, appendix, and cecum are normal.
Moderate volume stool throughout the colon. Large volume stool in
the rectosigmoid colon due to the rectum.

Vascular/Lymphatic: Abdominal aorta is normal caliber with
atherosclerotic calcification. There is no retroperitoneal or
periportal lymphadenopathy. No pelvic lymphadenopathy.

Reproductive: Uterus and ovaries are normal.

Other: No free fluid or abscess in the abdomen pelvis.

Musculoskeletal: No osseous erosion to suggest acute osteomyelitis.
Small skin ulceration posterior to the inferior sacrum on image 70,
series 2. No subcutaneous abscess identified.
IMPRESSION: 1. Linear nodular airspace disease in the RIGHT lower lobe
suggesting pneumonia or aspiration pneumonitis.
2. Biliary Dilatation involving the intrahepatic and extrahepatic
ducts and common bile duct up to the ampulla. No pancreatic mass
identified. With elevated bilirubin, patient may benefit from ERCP.
MRCP may help evaluate the distal common bile duct and pancreatic
head however acutely ill patients are often poor candidates for MRCP
due to inability to hold breath for the extended sequences.
3. Dilatation of the pancreatic duct is progressed from 9515.
Potential ductus divisum anatomy. Recommend evaluation with MRCP /
ERCP as above.
4. Bladder distension and moderate distal stool volume. Recommend
clinical correlation.

## 2017-07-20 IMAGING — CR DG CHEST 1V PORT
1 series · 1 of 1 positions shown · non-contrast
Comparison: Single view of the chest 09/23/2015 P

CLINICAL DATA: Dyspnea.  Sepsis.

EXAM:
PORTABLE CHEST 1 VIEW

[ap]
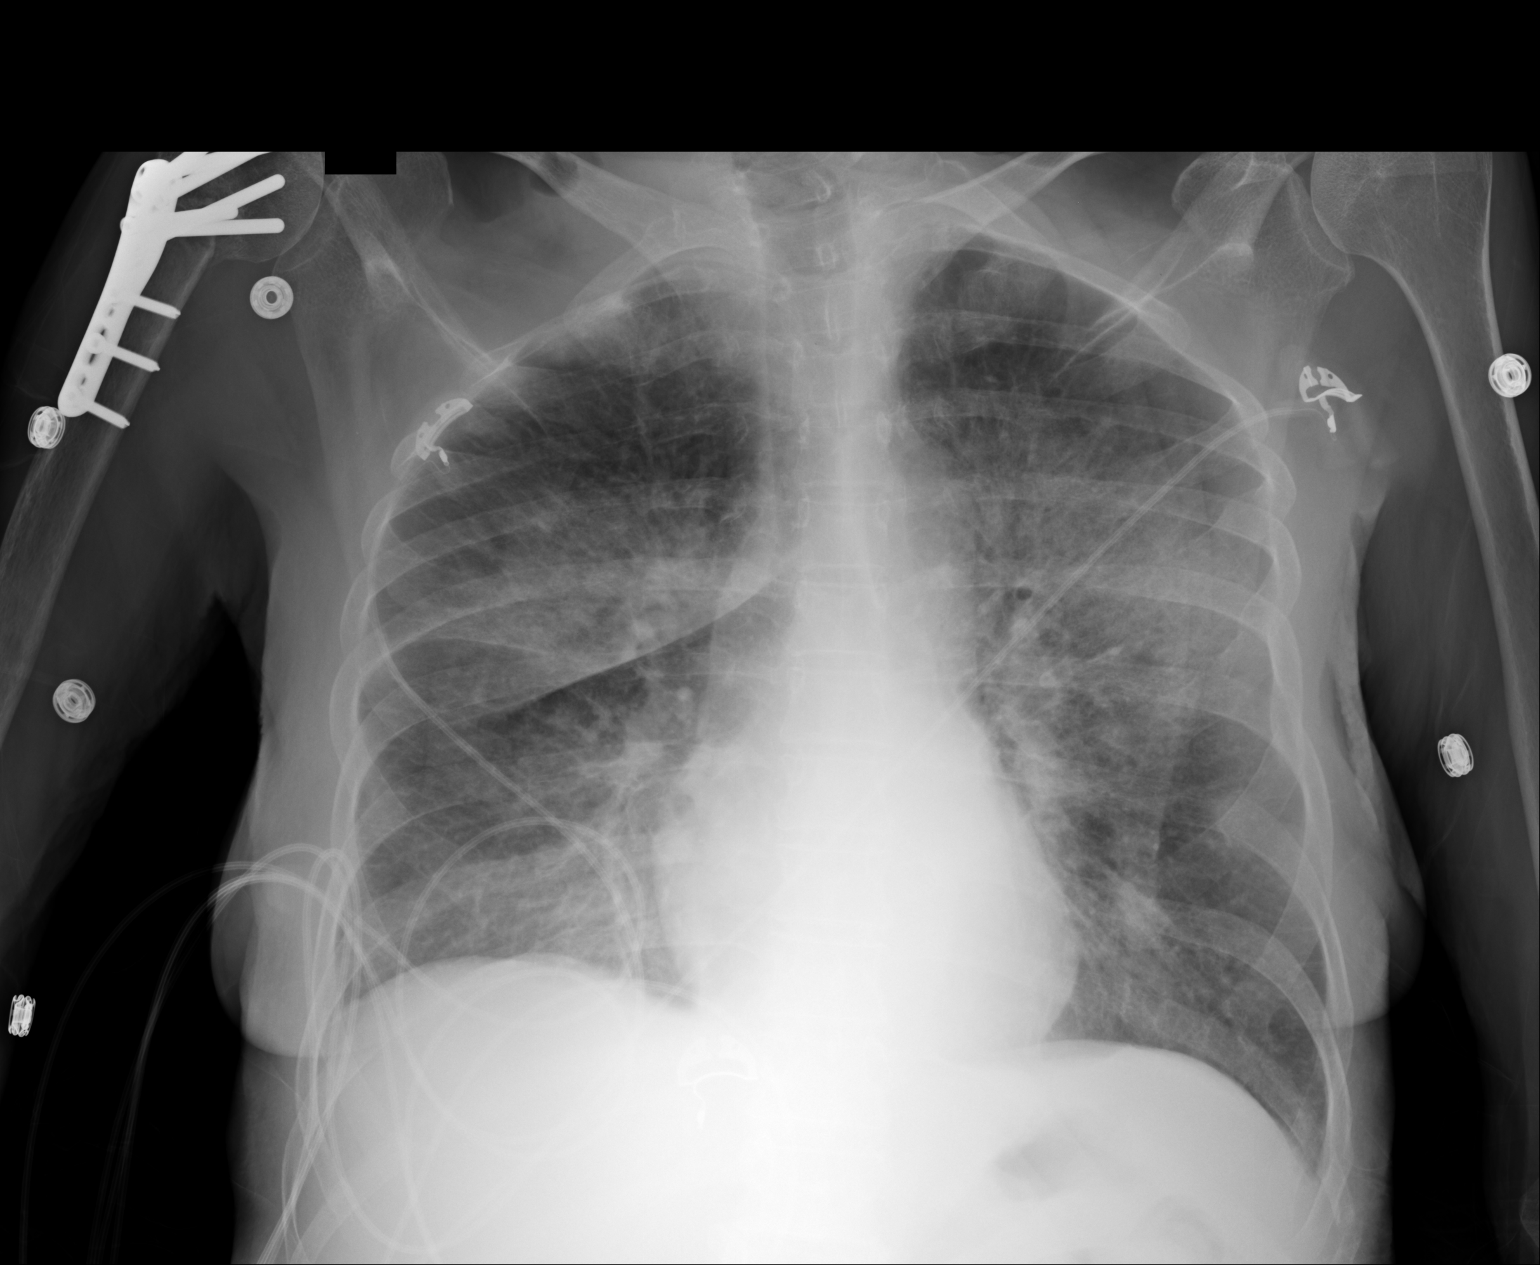

[1 of 1 positions shown; findings below may reference images not displayed]

FINDINGS: There is extensive bilateral airspace disease which is new since the
prior study. Heart size is normal. No pneumothorax or pleural
effusion.
IMPRESSION: Extensive bilateral airspace disease worrisome for pneumonia.
